# Patient Record
Sex: Male | Born: 1945 | Race: White | Hispanic: No | Smoking: Former smoker
Health system: Southern US, Community
[De-identification: ages and names within clinical notes are randomized; demographics above are authoritative.]

## PROBLEM LIST (undated history)

## (undated) DIAGNOSIS — I779 Disorder of arteries and arterioles, unspecified: Secondary | ICD-10-CM

## (undated) DIAGNOSIS — K219 Gastro-esophageal reflux disease without esophagitis: Secondary | ICD-10-CM

## (undated) DIAGNOSIS — D649 Anemia, unspecified: Secondary | ICD-10-CM

## (undated) DIAGNOSIS — I509 Heart failure, unspecified: Secondary | ICD-10-CM

## (undated) DIAGNOSIS — I739 Peripheral vascular disease, unspecified: Secondary | ICD-10-CM

## (undated) DIAGNOSIS — I714 Abdominal aortic aneurysm, without rupture, unspecified: Secondary | ICD-10-CM

## (undated) DIAGNOSIS — E785 Hyperlipidemia, unspecified: Secondary | ICD-10-CM

## (undated) DIAGNOSIS — I2510 Atherosclerotic heart disease of native coronary artery without angina pectoris: Secondary | ICD-10-CM

## (undated) DIAGNOSIS — C801 Malignant (primary) neoplasm, unspecified: Secondary | ICD-10-CM

## (undated) DIAGNOSIS — S72009A Fracture of unspecified part of neck of unspecified femur, initial encounter for closed fracture: Secondary | ICD-10-CM

## (undated) DIAGNOSIS — J449 Chronic obstructive pulmonary disease, unspecified: Secondary | ICD-10-CM

## (undated) DIAGNOSIS — I499 Cardiac arrhythmia, unspecified: Secondary | ICD-10-CM

## (undated) DIAGNOSIS — Z5189 Encounter for other specified aftercare: Secondary | ICD-10-CM

## (undated) DIAGNOSIS — H269 Unspecified cataract: Secondary | ICD-10-CM

## (undated) DIAGNOSIS — G459 Transient cerebral ischemic attack, unspecified: Secondary | ICD-10-CM

## (undated) DIAGNOSIS — I639 Cerebral infarction, unspecified: Secondary | ICD-10-CM

## (undated) DIAGNOSIS — M1990 Unspecified osteoarthritis, unspecified site: Secondary | ICD-10-CM

## (undated) DIAGNOSIS — F32A Depression, unspecified: Secondary | ICD-10-CM

## (undated) DIAGNOSIS — I10 Essential (primary) hypertension: Secondary | ICD-10-CM

## (undated) DIAGNOSIS — I214 Non-ST elevation (NSTEMI) myocardial infarction: Secondary | ICD-10-CM

## (undated) HISTORY — DX: Abdominal aortic aneurysm, without rupture, unspecified: I71.40

## (undated) HISTORY — PX: JOINT REPLACEMENT: SHX530

## (undated) HISTORY — DX: Peripheral vascular disease, unspecified: I73.9

## (undated) HISTORY — DX: Anemia, unspecified: D64.9

## (undated) HISTORY — DX: Fracture of unspecified part of neck of unspecified femur, initial encounter for closed fracture: S72.009A

## (undated) HISTORY — PX: EYE SURGERY: SHX253

## (undated) HISTORY — DX: Essential (primary) hypertension: I10

## (undated) HISTORY — DX: Gastro-esophageal reflux disease without esophagitis: K21.9

## (undated) HISTORY — DX: Disorder of arteries and arterioles, unspecified: I77.9

## (undated) HISTORY — DX: Encounter for other specified aftercare: Z51.89

## (undated) HISTORY — DX: Abdominal aortic aneurysm, without rupture: I71.4

## (undated) HISTORY — DX: Atherosclerotic heart disease of native coronary artery without angina pectoris: I25.10

## (undated) HISTORY — DX: Heart failure, unspecified: I50.9

## (undated) HISTORY — PX: OTHER SURGICAL HISTORY: SHX169

## (undated) HISTORY — PX: SPINE SURGERY: SHX786

## (undated) HISTORY — PX: FRACTURE SURGERY: SHX138

## (undated) HISTORY — DX: Non-ST elevation (NSTEMI) myocardial infarction: I21.4

---

## 2002-01-21 DIAGNOSIS — I639 Cerebral infarction, unspecified: Secondary | ICD-10-CM

## 2002-01-21 HISTORY — DX: Cerebral infarction, unspecified: I63.9

## 2004-01-22 DIAGNOSIS — G459 Transient cerebral ischemic attack, unspecified: Secondary | ICD-10-CM

## 2004-01-22 HISTORY — DX: Transient cerebral ischemic attack, unspecified: G45.9

## 2005-01-21 DIAGNOSIS — I214 Non-ST elevation (NSTEMI) myocardial infarction: Secondary | ICD-10-CM

## 2005-01-21 HISTORY — DX: Non-ST elevation (NSTEMI) myocardial infarction: I21.4

## 2005-06-02 ENCOUNTER — Ambulatory Visit: Payer: Self-pay | Admitting: Cardiology

## 2005-06-02 ENCOUNTER — Inpatient Hospital Stay (HOSPITAL_COMMUNITY): Admission: EM | Admit: 2005-06-02 | Discharge: 2005-06-05 | Payer: Self-pay | Admitting: Emergency Medicine

## 2005-07-01 ENCOUNTER — Ambulatory Visit: Payer: Self-pay | Admitting: Cardiology

## 2005-09-25 ENCOUNTER — Ambulatory Visit: Payer: Self-pay | Admitting: Cardiology

## 2011-12-10 ENCOUNTER — Emergency Department (HOSPITAL_COMMUNITY): Payer: BC Managed Care – PPO

## 2011-12-10 ENCOUNTER — Emergency Department (HOSPITAL_COMMUNITY)
Admission: EM | Admit: 2011-12-10 | Discharge: 2011-12-10 | Disposition: A | Discharge status: Home / Self Care | Payer: BC Managed Care – PPO | Attending: Emergency Medicine | Admitting: Emergency Medicine

## 2011-12-10 ENCOUNTER — Encounter (HOSPITAL_COMMUNITY): Payer: Self-pay | Admitting: *Deleted

## 2011-12-10 DIAGNOSIS — I2510 Atherosclerotic heart disease of native coronary artery without angina pectoris: Secondary | ICD-10-CM | POA: Insufficient documentation

## 2011-12-10 DIAGNOSIS — M5430 Sciatica, unspecified side: Secondary | ICD-10-CM | POA: Insufficient documentation

## 2011-12-10 DIAGNOSIS — I714 Abdominal aortic aneurysm, without rupture, unspecified: Secondary | ICD-10-CM | POA: Insufficient documentation

## 2011-12-10 DIAGNOSIS — I10 Essential (primary) hypertension: Secondary | ICD-10-CM | POA: Insufficient documentation

## 2011-12-10 DIAGNOSIS — M5432 Sciatica, left side: Secondary | ICD-10-CM

## 2011-12-10 DIAGNOSIS — F17200 Nicotine dependence, unspecified, uncomplicated: Secondary | ICD-10-CM | POA: Insufficient documentation

## 2011-12-10 DIAGNOSIS — I252 Old myocardial infarction: Secondary | ICD-10-CM | POA: Insufficient documentation

## 2011-12-10 DIAGNOSIS — Z8673 Personal history of transient ischemic attack (TIA), and cerebral infarction without residual deficits: Secondary | ICD-10-CM | POA: Insufficient documentation

## 2011-12-10 DIAGNOSIS — Z7982 Long term (current) use of aspirin: Secondary | ICD-10-CM | POA: Insufficient documentation

## 2011-12-10 HISTORY — DX: Cerebral infarction, unspecified: I63.9

## 2011-12-10 LAB — COMPREHENSIVE METABOLIC PANEL
ALT: 7 U/L (ref 0–53)
AST: 11 U/L (ref 0–37)
Albumin: 3.6 g/dL (ref 3.5–5.2)
Alkaline Phosphatase: 48 U/L (ref 39–117)
BUN: 11 mg/dL (ref 6–23)
CO2: 16 mEq/L — ABNORMAL LOW (ref 19–32)
Calcium: 8.9 mg/dL (ref 8.4–10.5)
Chloride: 109 mEq/L (ref 96–112)
Creatinine, Ser: 1.19 mg/dL (ref 0.50–1.35)
GFR calc Af Amer: 73 mL/min — ABNORMAL LOW (ref >90)
GFR calc non Af Amer: 63 mL/min — ABNORMAL LOW (ref >90)
Glucose, Bld: 92 mg/dL (ref 70–99)
Potassium: 3.3 mEq/L — ABNORMAL LOW (ref 3.5–5.1)
Sodium: 138 mEq/L (ref 135–145)
Total Bilirubin: 0.1 mg/dL — ABNORMAL LOW (ref 0.3–1.2)
Total Protein: 6.5 g/dL (ref 6.0–8.3)

## 2011-12-10 LAB — CBC WITH DIFFERENTIAL/PLATELET
Basophils Absolute: 0.1 10*3/uL (ref 0.0–0.1)
Basophils Relative: 1 % (ref 0–1)
Eosinophils Absolute: 0.2 10*3/uL (ref 0.0–0.7)
Eosinophils Relative: 2 % (ref 0–5)
HCT: 41.2 % (ref 39.0–52.0)
Hemoglobin: 14.3 g/dL (ref 13.0–17.0)
Lymphocytes Relative: 20 % (ref 12–46)
Lymphs Abs: 1.9 10*3/uL (ref 0.7–4.0)
MCH: 31.2 pg (ref 26.0–34.0)
MCHC: 34.7 g/dL (ref 30.0–36.0)
MCV: 90 fL (ref 78.0–100.0)
Monocytes Absolute: 0.6 10*3/uL (ref 0.1–1.0)
Monocytes Relative: 6 % (ref 3–12)
Neutro Abs: 6.8 10*3/uL (ref 1.7–7.7)
Neutrophils Relative %: 71 % (ref 43–77)
Platelets: 183 10*3/uL (ref 150–400)
RBC: 4.58 MIL/uL (ref 4.22–5.81)
RDW: 14.3 % (ref 11.5–15.5)
WBC: 9.5 10*3/uL (ref 4.0–10.5)

## 2011-12-10 MED ORDER — IOHEXOL 350 MG/ML SOLN
100.0000 mL | Freq: Once | INTRAVENOUS | Status: AC | PRN
  Administered 2011-12-10: 100 mL via INTRAVENOUS

## 2011-12-10 MED ORDER — OXYCODONE-ACETAMINOPHEN 5-325 MG PO TABS: 1.0000 | ORAL_TABLET | ORAL | Status: DC | PRN

## 2011-12-10 MED ORDER — OXYCODONE-ACETAMINOPHEN 5-325 MG PO TABS: 1.0000 | ORAL_TABLET | ORAL | Status: AC | PRN

## 2011-12-10 NOTE — ED Provider Notes (Signed)
History   This chart was scribed for Dione Booze, MD by Gerlean Ren, ED Scribe. This patient was seen in room [facility]14/[facility]14 and the patient's care was started at 8:07 PM    CSN: [id number]  Arrival date & time 12/10/11  0865   First MD Initiated Contact with Patient 12/10/11 1940      Chief Complaint  Patient presents with  . Leg Pain     The history is provided by the patient. No language interpreter was used.   Darryl Ward is a 66 y.o. male who presents to the Emergency Department complaining of 3 months of moderate, gradually worsening left leg pain radiating from hip to toes that is worsened by ambulation and improved by lying down.  Pt states pain started in left lumbar region with sudden onset but no injury or trauma as cause and is now only in left lower extremity.  Pt reports associated mild tingling in left distal lower extremity but denies numbness or weakness.  Pt reports he has lost 25 lbs in past 3 months with associated decreased appetite.  Pt has h/o MI, CVA, CAD, and HTN.  Pt is a current some day smoker but denies alcohol use.    Past Medical History  Diagnosis Date  . Myocardial infarct   . Stroke   . Coronary artery disease   . Hypertension     Past Surgical History  Procedure Date  . Coronary angioplasty with stent placement     History reviewed. No pertinent family history.  History  Substance Use Topics  . Smoking status: Current Some Day Smoker  . Smokeless tobacco: Not on file  . Alcohol Use: No      Review of Systems  Musculoskeletal: Negative for back pain.       Left leg pain.  Neurological: Negative for numbness.  All other systems reviewed and are negative.    Allergies  Review of patient's allergies indicates no known allergies.  Home Medications   Current Outpatient Rx  Name  Route  Sig  Dispense  Refill  . ASPIRIN 325 MG PO TABS   Oral   Take 325 mg by mouth daily.         . IBUPROFEN 200 MG PO TABS   Oral   Take 400  mg by mouth daily as needed. FOR PAIN         . MENTHOL (TOPICAL ANALGESIC) 5 % EX PADS   Apply externally   Apply 1 application topically daily as needed. FOR PAIN           BP 182/82  Pulse 83  Temp 97.2 F (36.2 C) (Oral)  Resp 18  Ht 5\' 10"  (1.778 m)  Wt 174 lb (78.926 kg)  BMI 24.97 kg/m2  SpO2 100%  Physical Exam  Nursing note and vitals reviewed. Constitutional: He is oriented to person, place, and time. He appears well-developed and well-nourished.  HENT:  Head: Normocephalic and atraumatic.  Eyes: Conjunctivae normal and EOM are normal. Pupils are equal, round, and reactive to light.  Neck: Normal range of motion. Neck supple.  Cardiovascular: Normal rate, regular rhythm and normal heart sounds.   Pulmonary/Chest: Effort normal and breath sounds normal.  Abdominal: Soft. Bowel sounds are normal.       A pulsatile aorta palpable, and nontender. No bruit heard.  Musculoskeletal: Normal range of motion.       Positive left straight leg raise at 60 degrees.  Pain on ROM of left hip.  Moderate tenderness left inguinal fold.    Neurological: He is alert and oriented to person, place, and time.  Skin: Skin is warm and dry.       1cm pedunculated, smooth, soft, mole on left upper arm.  Psychiatric: He has a normal mood and affect.    ED Course  Procedures (including critical care time) DIAGNOSTIC STUDIES: Oxygen Saturation is 100% on room air, normal by my interpretation.    COORDINATION OF CARE: 8:14 PM- Patient informed of clinical course, understands medical decision-making process, and agrees with plan.  Ordered CBC, c-met, left hip XR, chest XR, and lumbar spine XR.      Labs Reviewed - No data to display Results for orders placed during the hospital encounter of 12/10/11  CBC WITH DIFFERENTIAL      Component Value Range   WBC 9.5  4.0 - 10.5 K/uL   RBC 4.58  4.22 - 5.81 MIL/uL   Hemoglobin 14.3  13.0 - 17.0 g/dL   HCT 40.9  81.1 - 91.4 %   MCV 90.0   78.0 - 100.0 fL   MCH 31.2  26.0 - 34.0 pg   MCHC 34.7  30.0 - 36.0 g/dL   RDW 78.2  95.6 - 21.3 %   Platelets 183  150 - 400 K/uL   Neutrophils Relative 71  43 - 77 %   Neutro Abs 6.8  1.7 - 7.7 K/uL   Lymphocytes Relative 20  12 - 46 %   Lymphs Abs 1.9  0.7 - 4.0 K/uL   Monocytes Relative 6  3 - 12 %   Monocytes Absolute 0.6  0.1 - 1.0 K/uL   Eosinophils Relative 2  0 - 5 %   Eosinophils Absolute 0.2  0.0 - 0.7 K/uL   Basophils Relative 1  0 - 1 %   Basophils Absolute 0.1  0.0 - 0.1 K/uL  COMPREHENSIVE METABOLIC PANEL      Component Value Range   Sodium 138  135 - 145 mEq/L   Potassium 3.3 (*) 3.5 - 5.1 mEq/L   Chloride 109  96 - 112 mEq/L   CO2 16 (*) 19 - 32 mEq/L   Glucose, Bld 92  70 - 99 mg/dL   BUN 11  6 - 23 mg/dL   Creatinine, Ser 0.86  0.50 - 1.35 mg/dL   Calcium 8.9  8.4 - 57.8 mg/dL   Total Protein 6.5  6.0 - 8.3 g/dL   Albumin 3.6  3.5 - 5.2 g/dL   AST 11  0 - 37 U/L   ALT 7  0 - 53 U/L   Alkaline Phosphatase 48  39 - 117 U/L   Total Bilirubin 0.1 (*) 0.3 - 1.2 mg/dL   GFR calc non Af Amer 63 (*) >90 mL/min   GFR calc Af Amer 73 (*) >90 mL/min    Dg Chest 2 View  12/10/2011  *RADIOLOGY REPORT*  Clinical Data: Weight loss, leg pain.  CHEST - 2 VIEW  Comparison: 06/04/2005  Findings: Heart size within normal limits.  Aortic atherosclerosis. Mild hyperinflation and interstitial coarsening.  Sequelae of prior right anterolateral rib fractures.  No acute osseous finding.  IMPRESSION: Mild chronic changes.  No definite acute process.   Original Report Authenticated By: Jearld Lesch, M.D.    Dg Lumbar Spine Complete  12/10/2011  *RADIOLOGY REPORT*  Clinical Data: Leg pain  LUMBAR SPINE - COMPLETE 4+ VIEW  Comparison: None.  Findings: Multilevel degenerative changes with large lateral osteophytes at  L2-3 and L3-4 and multilevel anterior osteophytes. Gentle lumbar leftward curvature.  No acute fracture or dislocation.  No aggressive osseous lesions. Calcific  densities projecting over the renal shadows may reflect stones. Atherosclerotic aortic calcification. Suspicion for aneurysmal dilatation of the abdominal aorta up to 6.8 cm.  IMPRESSION: Multilevel degenerative changes.  No acute osseous abnormality.  Aneurysmal dilatation of the infrarenal abdominal aorta.  Recommend this be further evaluated with ultrasound or CTA.   Original Report Authenticated By: Jearld Lesch, M.D.    Dg Hip Complete Left  12/10/2011  *RADIOLOGY REPORT*  Clinical Data: Left leg pain  LEFT HIP - COMPLETE 2+ VIEW  Comparison: None.  Findings: No fracture or dislocation.  No aggressive osseous lesion or overt degenerative change.  Overlying soft tissues unremarkable.  IMPRESSION: No acute osseous abnormality of the left hip.   Original Report Authenticated By: Jearld Lesch, M.D.    Ct Cta Abd/pel W/cm &/or W/o Cm  12/10/2011  *RADIOLOGY REPORT*  Clinical Data: Leg pain.  Abdominal pain.  Weight loss.  CT ANGIOGRAPHY ABDOMEN AND PELVIS WITH CONTRAST AND WITHOUT CONTRAST  Comparison: 12/10/2011 lumbar spine radiograph  Findings: Limited images through the lung bases demonstrate no significant appreciable abnormality. The heart size is within normal limits. No pleural or pericardial effusion.  Unremarkable liver, biliary system, pancreas, spleen, adrenal glands.  Lobular renal contours with symmetric enhancement.  No hydronephrosis or hydroureter.  No bowel obstruction.  No CT evidence for colitis.  Normal appendix.  No free intraperitoneal air or fluid.  No lymphadenopathy.  Atherosclerotic disease of the aorta and branch vessels.  Patent celiac axis, SMA, single renal arteries bilaterally, and IMA.  At the level of the IMA, there is aneurysmal dilatation of the aorta measuring up to 5.5 cm in diameter.  No retroperitoneal fat stranding or wall disruption.  The right common, internal, and external iliac arteries are occluded.  There is reconstitution of flow to the right femoral  artery via collaterals.  The left common iliac, internal iliac, and external iliac show advanced atherosclerotic disease however remain patent.  Thin-walled bladder.  Nonspecific prostatic calcifications.  Small fat containing left inguinal hernia and small right inguinal hernia versus spermatic cord lipoma.  Multilevel degenerative changes of the imaged spine. No acute or aggressive appearing osseous lesion.  IMPRESSION:  No acute abdominopelvic process.  Infrarenal abdominal aortic aneurysm measures up to 5.5 cm in diameter.  Occluded right common, internal, and external iliac artery. Reconstitution to the right lower extremity/femoral artery via collateral flow.   Original Report Authenticated By: Jearld Lesch, M.D.      1. Sciatica of left side   2. Abdominal aortic aneurysm       MDM  Leg pain which has components of both sciatica and arthritis of the hip. Weight loss of uncertain cause. Since he is a smoker, it screening chest x-ray will be obtained, lumbar spine films will be obtained, and x-rays of the left hip will be obtained.  Lumbar spine films show a large abdominal aortic aneurysm. If this is dissecting, it could be causing his pain. He'll be sent for CT angiogram.  CT angiogram shows a 5.5 cm aneurysm. This is a size that generally would require elective repair. He is referred to Dr. Darrick Penna of vascular surgery for followup. He is given a prescription for Percocet for pain. No adequate explanation has been found for his weight loss.  I personally performed the services described in this documentation, which was scribed in  my presence. The recorded information has been reviewed and is accurate.           Dione Booze, MD 12/10/11 [phone]

## 2011-12-10 NOTE — ED Notes (Signed)
Pt back from radiology, resting quietly in room.

## 2011-12-10 NOTE — ED Notes (Signed)
Pt reporting pain in left leg from hip to toes, some weakness as well. Reports some tingling "like it's asleep" in feet bilaterally. Pedal pulses palpable.

## 2011-12-10 NOTE — ED Notes (Signed)
Patient transported to CT 

## 2011-12-10 NOTE — ED Notes (Addendum)
Pain lt leg from hip to toes, no known injury,  Has lost app 25 lbs in 4 mos , and has a black mole that he wants checked.

## 2011-12-12 ENCOUNTER — Other Ambulatory Visit (INDEPENDENT_AMBULATORY_CARE_PROVIDER_SITE_OTHER): Payer: BC Managed Care – PPO | Admitting: *Deleted

## 2011-12-12 ENCOUNTER — Ambulatory Visit (INDEPENDENT_AMBULATORY_CARE_PROVIDER_SITE_OTHER): Payer: BC Managed Care – PPO | Admitting: Vascular Surgery

## 2011-12-12 ENCOUNTER — Encounter: Payer: Self-pay | Admitting: Vascular Surgery

## 2011-12-12 VITALS — BP 137/76 | HR 78 | Resp 16 | Ht 70.0 in | Wt 168.5 lb

## 2011-12-12 DIAGNOSIS — Z01818 Encounter for other preprocedural examination: Secondary | ICD-10-CM

## 2011-12-12 DIAGNOSIS — I714 Abdominal aortic aneurysm, without rupture, unspecified: Secondary | ICD-10-CM | POA: Insufficient documentation

## 2011-12-12 DIAGNOSIS — Z01810 Encounter for preprocedural cardiovascular examination: Secondary | ICD-10-CM

## 2011-12-12 DIAGNOSIS — R0989 Other specified symptoms and signs involving the circulatory and respiratory systems: Secondary | ICD-10-CM

## 2011-12-12 NOTE — Progress Notes (Signed)
[facility] [place] HISTORY AND PHYSICAL   History of Present Illness:  Patient is a 65 y.o. year old male who presents for evaluation of abdominal aortic aneurysm.  The patient has complained of left lower extremity aching pain for approximately 4 weeks. He denies any claudication symptoms in the right lower extremity. He denies back pain but states that he did have some left flank pain recently. The aneurysm is currently 5.5 cm in diameter by CT performed at [facility] on 11/19.  The patient denies abdominal pain.  The patient denies back pain.  The patient hadenies family history of AAA.  Other medical problems include coronary artery disease with previous myocardial infarction and coronary stent in 2007 by [place]. He has ongoing tobacco abuse approximately half to 1 pack of cigarettes per day. Greater than 3 minutes today were spent regarding smoking cessation counseling. He also has a history of hypertension. All of these are currently controlled. He is currently taking Percocet for the left lower extremity pain. He is fairly active at work and works in a [employer].  Past Medical History  Diagnosis Date  . Myocardial infarct   . Stroke   . Coronary artery disease   . Hypertension   . AAA (abdominal aortic aneurysm)     Past Surgical History  Procedure Date  . Coronary angioplasty with stent placement      Social History History  Substance Use Topics  . Smoking status: Current Some Day Smoker -- 0.5 packs/day    Types: Cigarettes  . Smokeless tobacco: Never Used     Comment: pt states that he is not interested in a support group or QUIT NOW  . Alcohol Use: No    Family History Family History  Problem Relation Age of Onset  . Hyperlipidemia Sister   . Heart attack Brother     Allergies  No Known Allergies   Current Outpatient Prescriptions  Medication Sig Dispense Refill  . oxyCODONE-acetaminophen (PERCOCET/ROXICET) 5-325 MG per tablet Take 1 tablet  by mouth every 4 (four) hours as needed for pain.  6 tablet  0  . aspirin 325 MG tablet Take 325 mg by mouth daily.      [name] ibuprofen (ADVIL,MOTRIN) 200 MG tablet Take 400 mg by mouth daily as needed. FOR PAIN      . Menthol, Topical Analgesic, (ICY HOT) 5 % PADS Apply 1 application topically daily as needed. FOR PAIN        ROS:   General:  No weight loss, Fever, chills  HEENT: No recent headaches, no nasal bleeding, no visual changes, no sore throat  Neurologic: No dizziness, blackouts, seizures. No recent symptoms of stroke or mini- stroke. No recent episodes of slurred speech, or temporary blindness.  Cardiac: No recent episodes of chest pain/pressure, no shortness of breath at rest.  No shortness of breath with exertion.  Denies history of atrial fibrillation or irregular heartbeat  Vascular: No history of rest pain in feet.  No history of claudication.  No history of non-healing ulcer, No history of DVT   Pulmonary: No home oxygen, no productive cough, no hemoptysis,  No asthma or wheezing  Musculoskeletal:  [ ]  Arthritis, [ ]  Low back pain,  [ ]  Joint pain  Hematologic:No history of hypercoagulable state.  No history of easy bleeding.  No history of anemia  Gastrointestinal: No hematochezia or melena,  No gastroesophageal reflux, no trouble swallowing  Urinary: [ ]  chronic Kidney disease, [ ]  on HD - [ ]   MWF or [ ]  TTHS, [ ]  Burning with urination, [ ]  Frequent urination, [ ]  Difficulty urinating;   Skin: No rashes  Psychological: No history of anxiety,  No history of depression   Physical Examination  Filed Vitals:   12/12/11 0837  BP: 137/76  Pulse: 78  Resp: 16  Height: 5\' 10"  (1.778 m)  Weight: 168 lb 8 oz (76.431 kg)  SpO2: 100%    Body mass index is 24.18 kg/(m^2).  General:  Alert and oriented, no acute distress HEENT: Normal Neck: Right side bruit no JVD Pulmonary: Clear to auscultation bilaterally Cardiac: Regular Rate and Rhythm without  murmur Gastrointestinal: Soft, non-tender, non-distended, pulsatile epigastric mass, no scars Skin: No rash Extremity Pulses:  2+ radial right side one plus left radial, 2+ left femoral absent right femoral, absent popliteal dorsalis pedis, posterior tibial pulses bilaterally Musculoskeletal: No deformity or edema  Neurologic: Upper and lower extremity motor 5/5 and symmetric  DATA: CT angiogram the abdomen and pelvis is reviewed today. This shows a 5.5 cm infrarenal abdominal aortic aneurysm. The neck below the renal arteries is lined with approximately 10% thrombus. It is 27 mm in diameter. The right common internal and external iliac arteries are occluded. There is calcific plaque throughout the entire left iliac system but no obvious flow-limiting stenosis. He had evidence of small bilateral inguinal hernias. Anterior left renal vein. Patent left and right renal arteries patent superior mesenteric and celiac arteries.   ASSESSMENT: Asymptomatic abdominal aortic aneurysm most likely not the cause of his left lower extremity pain but certainly at a size to warrant repair. Asymptomatic right carotid bruit.   PLAN:  Carotid duplex exam today to evaluate right-sided carotid bruit. #2 referral to [facility] cardiology for cardiac risk stratification prior to aortic aneurysm repair. #3 the patient will most likely need an open abdominal aortic aneurysm repair based on thrombus lining the neck this aorta as well as occlusive disease.  He may require aortobifemoral bypass grafting. The patient will have a followup visit with me after his cardiology evaluation to discuss the final plan for his operation. He is advised to return to the emergency room if he develops severe abdominal or excruciating back pain prior to his operation and let them know he has an abdominal aortic aneurysm. We will also schedule him for bilateral ABIs and arterial duplex for his claudication symptoms when he returns for his preoperative  visit.   Fabienne Bruns, MD [facility] [place] Office: [phone] Pager: [phone]  Addendum: Carotid duplex scan was performed today which are reviewed and interpreted. This showed less than 40% left and right internal carotid artery stenosis bilaterally.  Patient gave [name] paperwork today for FMLA and short term disability. Will return this to him soon. He was given a note to miss work since he is unable to perform his job duties he was left lower extremity pain.  Fabienne Bruns, MD [facility] [place] Office: [phone] Pager: [phone]

## 2011-12-12 NOTE — Addendum Note (Signed)
Addended by: Sharee Pimple on: 12/12/2011 03:18 PM   Modules accepted: Orders

## 2011-12-13 ENCOUNTER — Other Ambulatory Visit: Payer: Self-pay | Admitting: *Deleted

## 2011-12-13 ENCOUNTER — Encounter: Payer: Self-pay | Admitting: *Deleted

## 2011-12-13 ENCOUNTER — Ambulatory Visit (INDEPENDENT_AMBULATORY_CARE_PROVIDER_SITE_OTHER): Payer: BC Managed Care – PPO | Admitting: Cardiology

## 2011-12-13 ENCOUNTER — Encounter: Payer: Self-pay | Admitting: Cardiology

## 2011-12-13 VITALS — BP 138/88 | HR 71 | Ht 70.0 in | Wt 169.1 lb

## 2011-12-13 DIAGNOSIS — E782 Mixed hyperlipidemia: Secondary | ICD-10-CM | POA: Insufficient documentation

## 2011-12-13 DIAGNOSIS — Z01818 Encounter for other preprocedural examination: Secondary | ICD-10-CM

## 2011-12-13 DIAGNOSIS — I2510 Atherosclerotic heart disease of native coronary artery without angina pectoris: Secondary | ICD-10-CM | POA: Insufficient documentation

## 2011-12-13 MED ORDER — METOPROLOL SUCCINATE ER 25 MG PO TB24: 25.0000 mg | ORAL_TABLET | Freq: Every day | ORAL | Status: DC

## 2011-12-13 NOTE — Assessment & Plan Note (Signed)
History of PTCA to a small diagonal branch 2007, otherwise nonobstructive disease in the major epicardial vessels at that time. Followup ischemic testing being planned, with medical therapy otherwise.

## 2011-12-13 NOTE — Assessment & Plan Note (Signed)
Followup FLP.

## 2011-12-13 NOTE — Patient Instructions (Addendum)
Your physician recommends that you schedule a follow-up appointment in: 3 MONTHS WITH SM  Your physician has requested that you have a lexiscan myoview. For further information please visit [email or web address] Please follow instruction sheet, as given. WE WILL CALL YOU WITH THE RESULTS  Your physician recommends that you return for lab work in: ONE WEEK  Your physician has recommended you make the following change in your medication:   1) START TOPOROL XL 25MG  DAILY

## 2011-12-13 NOTE — Assessment & Plan Note (Signed)
Patient being considered for elective AAA repair under general anesthesia. His cardiac history was reviewed, fortunately he denies any angina and has had no decompensated heart failure symptoms. Medical therapy limited to aspirin only for quite some time. Baseline ECG reviewed and nonspecific. Plan is to proceed with a Lexiscan Myoview for ischemic burden assessment and further risk stratification. We will also initiate Toprol-XL 25 mg daily for further perioperative risk reduction, and follow up on lipid profile as statin therapy likely also be needed. Will call patient with results and forward them also to Dr. Darrick Penna.

## 2011-12-13 NOTE — Progress Notes (Signed)
Clinical Summary Darryl Ward is a 66 y.o.male referred for cardiology consultation by Dr. Darrick Ward. Recent abdominal CT scan revealed a 5.5 cm infrarenal AAA, being considered for probable open repair under general anesthesia. He has no primary care physician.  Cardiac history is noted below. He was last seen by Dr. Antoine Ward in the office back in 2007, has had no cardiology followup since then. ECG today shows sinus rhythm with PACs, nonspecific T wave changes. He reports no angina symptoms, NYHA class II dyspnea on exertion, no palpitations or syncope. Main limitation seems to be related to intermittent leg discomfort. He was given oxycodone by the ER physician.  Recent lab work noted including hemoglobin 14.3, platelets 183, BUN 11, creatinine 1.1.  He has had no interval ischemic evaluation since 2007. Also no recent lipid followup.  No Known Allergies  Current Outpatient Prescriptions  Medication Sig Dispense Refill  . aspirin 325 MG tablet Take 325 mg by mouth daily.      [name] ibuprofen (ADVIL,MOTRIN) 200 MG tablet Take 400 mg by mouth daily as needed. FOR PAIN      . Menthol, Topical Analgesic, (ICY HOT) 5 % PADS Apply 1 application topically daily as needed. FOR PAIN      . oxyCODONE-acetaminophen (PERCOCET/ROXICET) 5-325 MG per tablet Take 1 tablet by mouth every 4 (four) hours as needed for pain.  6 tablet  0  . metoprolol succinate (TOPROL XL) 25 MG 24 hr tablet Take 1 tablet (25 mg total) by mouth daily.  30 tablet  3    Past Medical History  Diagnosis Date  . NSTEMI (non-ST elevated myocardial infarction)     2007  . Stroke     2004  . Coronary atherosclerosis of native coronary artery     PTCA small diagonal 2007 otherwise nonobstructive CAD, EF 50-55%  . Essential hypertension, benign   . AAA (abdominal aortic aneurysm)   . Carotid artery disease     Nonobstructive    History reviewed. No pertinent past surgical history.  Family History  Problem Relation Age of Onset    . Hyperlipidemia Sister   . Heart attack Brother     Social History Darryl Ward reports that he has been smoking Cigarettes.  He has been smoking about .5 packs per day. He has never used smokeless tobacco. Darryl Ward reports that he does not drink alcohol.  Review of Systems Negative except as outlined.  Physical Examination Filed Vitals:   12/13/11 1439  BP: 138/88  Pulse: 71   Filed Weights   12/13/11 1439  Weight: 169 lb 1.9 oz (76.712 kg)   No acute distress. HEENT: Conjunctiva and lids normal, oropharynx clear. Neck: Supple, no elevated JVP, soft carotid bruits, no thyromegaly. Lungs: Clear to auscultation, nonlabored breathing at rest. Cardiac: Regular rate and rhythm, no S3 or significant systolic murmur, no pericardial rub. Abdomen: Soft, nontender, bowel sounds present. Extremities: No pitting edema, distal pulses 2+. Skin: Warm and dry. Musculoskeletal: No kyphosis. Neuropsychiatric: Alert and oriented x3, affect grossly appropriate.   Problem List and Plan   Preoperative evaluation to rule out surgical contraindication Patient being considered for elective AAA repair under general anesthesia. His cardiac history was reviewed, fortunately he denies any angina and has had no decompensated heart failure symptoms. Medical therapy limited to aspirin only for quite some time. Baseline ECG reviewed and nonspecific. Plan is to proceed with a Lexiscan Myoview for ischemic burden assessment and further risk stratification. We will also initiate Toprol-XL 25 mg  daily for further perioperative risk reduction, and follow up on lipid profile as statin therapy likely also be needed. Will call patient with results and forward them also to Dr. Darrick Ward.  Coronary atherosclerosis of native coronary artery History of PTCA to a small diagonal branch 2007, otherwise nonobstructive disease in the major epicardial vessels at that time. Followup ischemic testing being planned, with medical  therapy otherwise.  Mixed hyperlipidemia Followup FLP.    Darryl Ward, M.D., F.A.C.C.

## 2011-12-16 LAB — LIPID PANEL
Cholesterol: 181 mg/dL (ref 0–200)
HDL: 28 mg/dL — ABNORMAL LOW (ref >39)
LDL Cholesterol: 125 mg/dL — ABNORMAL HIGH (ref 0–99)
Total CHOL/HDL Ratio: 6.5 Ratio
Triglycerides: 142 mg/dL (ref <150)
VLDL: 28 mg/dL (ref 0–40)

## 2011-12-23 ENCOUNTER — Encounter: Payer: Self-pay | Admitting: *Deleted

## 2011-12-23 MED ORDER — SIMVASTATIN 20 MG PO TABS: 20.0000 mg | ORAL_TABLET | Freq: Every day | ORAL | Status: DC

## 2011-12-23 NOTE — Addendum Note (Signed)
Addended by: Derry Lory A on: 12/23/2011 08:18 AM   Modules accepted: Orders

## 2011-12-25 ENCOUNTER — Encounter: Payer: Self-pay | Admitting: Vascular Surgery

## 2011-12-25 ENCOUNTER — Other Ambulatory Visit: Payer: Self-pay | Admitting: *Deleted

## 2011-12-25 DIAGNOSIS — I739 Peripheral vascular disease, unspecified: Secondary | ICD-10-CM

## 2011-12-26 ENCOUNTER — Encounter: Payer: Self-pay | Admitting: Vascular Surgery

## 2011-12-26 ENCOUNTER — Encounter (INDEPENDENT_AMBULATORY_CARE_PROVIDER_SITE_OTHER): Payer: BC Managed Care – PPO | Admitting: *Deleted

## 2011-12-26 ENCOUNTER — Other Ambulatory Visit: Payer: Self-pay | Admitting: *Deleted

## 2011-12-26 ENCOUNTER — Ambulatory Visit (INDEPENDENT_AMBULATORY_CARE_PROVIDER_SITE_OTHER): Payer: BC Managed Care – PPO | Admitting: Vascular Surgery

## 2011-12-26 ENCOUNTER — Encounter: Payer: Self-pay | Admitting: *Deleted

## 2011-12-26 VITALS — BP 158/78 | HR 56 | Ht 70.0 in | Wt 172.0 lb

## 2011-12-26 DIAGNOSIS — I739 Peripheral vascular disease, unspecified: Secondary | ICD-10-CM

## 2011-12-26 DIAGNOSIS — I714 Abdominal aortic aneurysm, without rupture: Secondary | ICD-10-CM

## 2011-12-26 NOTE — Progress Notes (Signed)
Patient is a 66 year old male who returns for followup today. He has a known abdominal aortic aneurysm. He also has iliac occlusive disease bilaterally. He primarily complains of left leg pain. He recently saw Dr. Diona Browner for cardiac risk stratification. At that time he was placed on a beta blocker as well as a statin. Unfortunately he continues to smoke but is trying to quit. He is on aspirin. He denies any abdominal or back pain. He has very short distance claudication.  Past Medical History  Diagnosis Date  . NSTEMI (non-ST elevated myocardial infarction)     2007  . Stroke     2004  . Coronary atherosclerosis of native coronary artery     PTCA small diagonal 2007 otherwise nonobstructive CAD, EF 50-55%  . Essential hypertension, benign   . AAA (abdominal aortic aneurysm)   . Carotid artery disease     Nonobstructive   History reviewed. No pertinent past surgical history.   Review of systems: He denies shortness of breath. He denies chest pain. Current Outpatient Prescriptions on File Prior to Visit  Medication Sig Dispense Refill  . aspirin 325 MG tablet Take 325 mg by mouth daily.      [name] ibuprofen (ADVIL,MOTRIN) 200 MG tablet Take 400 mg by mouth daily as needed. FOR PAIN      . Menthol, Topical Analgesic, (ICY HOT) 5 % PADS Apply 1 application topically daily as needed. FOR PAIN      . metoprolol succinate (TOPROL XL) 25 MG 24 hr tablet Take 1 tablet (25 mg total) by mouth daily.  30 tablet  3  . simvastatin (ZOCOR) 20 MG tablet Take 1 tablet (20 mg total) by mouth at bedtime.  90 tablet  3    No Known Allergies  Physical exam:  Filed Vitals:   12/26/11 1319  BP: 158/78  Pulse: 56  Height: 5\' 10"  (1.778 m)  Weight: 172 lb (78.019 kg)  SpO2: 99%   Abdomen soft nontender nondistended vaguely palpable pulsatile mass  Extremity: Absent right femoral pulse 1+ left femoral pulse absent popliteal and pedal pulses  Data: Bilateral ABIs were performed today ABI on the left  was 0.95 right was 0.65  Assessment: Abdominal aortic aneurysm with iliac occlusive disease  Plan: #1 patient was given a prescription for Percocet today #30 dispensed to last him until the time of his operation #2 aortobifemoral bypass for repair of abdominal aortic aneurysm on Wednesday, December 18 pending the results of his stress test which is scheduled for Monday, December 9.  Fabienne Bruns, MD [facility] Office: [phone] Pager: [phone]

## 2011-12-30 ENCOUNTER — Encounter (HOSPITAL_COMMUNITY)
Admission: RE | Admit: 2011-12-30 | Discharge: 2011-12-30 | Disposition: A | Discharge status: Home / Self Care | Payer: BC Managed Care – PPO | Source: Ambulatory Visit | Attending: Cardiology | Admitting: Cardiology

## 2011-12-30 ENCOUNTER — Encounter (HOSPITAL_COMMUNITY): Payer: Self-pay

## 2011-12-30 ENCOUNTER — Encounter (HOSPITAL_COMMUNITY): Payer: Self-pay | Admitting: Cardiology

## 2011-12-30 ENCOUNTER — Ambulatory Visit (HOSPITAL_COMMUNITY)
Admission: RE | Admit: 2011-12-30 | Discharge: 2011-12-30 | Disposition: A | Discharge status: Home / Self Care | Payer: BC Managed Care – PPO | Source: Ambulatory Visit | Attending: Cardiology | Admitting: Cardiology

## 2011-12-30 DIAGNOSIS — I2510 Atherosclerotic heart disease of native coronary artery without angina pectoris: Secondary | ICD-10-CM | POA: Insufficient documentation

## 2011-12-30 DIAGNOSIS — Z8673 Personal history of transient ischemic attack (TIA), and cerebral infarction without residual deficits: Secondary | ICD-10-CM | POA: Insufficient documentation

## 2011-12-30 DIAGNOSIS — I739 Peripheral vascular disease, unspecified: Secondary | ICD-10-CM | POA: Insufficient documentation

## 2011-12-30 DIAGNOSIS — I10 Essential (primary) hypertension: Secondary | ICD-10-CM | POA: Insufficient documentation

## 2011-12-30 DIAGNOSIS — Z01818 Encounter for other preprocedural examination: Secondary | ICD-10-CM

## 2011-12-30 MED ORDER — REGADENOSON 0.4 MG/5ML IV SOLN
INTRAVENOUS | Status: AC
  Administered 2011-12-30: 0.4 mg via INTRAVENOUS
  Filled 2011-12-30: qty 5

## 2011-12-30 MED ORDER — TECHNETIUM TC 99M SESTAMIBI - CARDIOLITE
30.0000 | Freq: Once | INTRAVENOUS | Status: AC | PRN
  Administered 2011-12-30: 29 via INTRAVENOUS

## 2011-12-30 MED ORDER — SODIUM CHLORIDE 0.9 % IJ SOLN
INTRAMUSCULAR | Status: AC
  Administered 2011-12-30: 10 mL via INTRAVENOUS
  Filled 2011-12-30: qty 10

## 2011-12-30 MED ORDER — TECHNETIUM TC 99M SESTAMIBI GENERIC - CARDIOLITE
10.0000 | Freq: Once | INTRAVENOUS | Status: AC | PRN
  Administered 2011-12-30: 10.8 via INTRAVENOUS

## 2011-12-30 NOTE — Progress Notes (Signed)
Stress Lab Nurses Notes - Darryl Ward 12/30/2011 Reason for doing test: CAD and Surgical Clearance Type of test: Marlane Hatcher Nurse performing test: Parke Poisson, RN Nuclear Medicine Tech: Lou Cal Echo Tech: Not Applicable MD performing test: Ival Bible & Joni Reining NP Family MD: [facility] explained and consent signed: yes IV started: 22g jelco, Saline lock flushed, No redness or edema and Saline lock started in radiology Symptoms: "Felt Funny" Treatment/Intervention: None Reason test stopped: protocol completed After Recovery IV was: Discontinued via X-ray tech and No redness or edema Patient to return to Nuc. Med at : 11:45 Patient discharged: Home Patient's Condition upon discharge was: stable Comments: During test BP 157/81 & HR 104.  Recovery BP 155/82 & HR 85.  Symptoms resolved in recovery. Darryl Ward

## 2011-12-31 ENCOUNTER — Encounter (HOSPITAL_COMMUNITY): Payer: Self-pay | Admitting: Pharmacy Technician

## 2012-01-02 ENCOUNTER — Telehealth: Payer: Self-pay | Admitting: *Deleted

## 2012-01-02 NOTE — Telephone Encounter (Signed)
Opened in error

## 2012-01-03 ENCOUNTER — Encounter (HOSPITAL_COMMUNITY)
Admission: RE | Admit: 2012-01-03 | Discharge: 2012-01-03 | Disposition: A | Discharge status: Home / Self Care | Payer: BC Managed Care – PPO | Source: Ambulatory Visit | Attending: Vascular Surgery | Admitting: Vascular Surgery

## 2012-01-03 ENCOUNTER — Encounter (HOSPITAL_COMMUNITY): Payer: Self-pay

## 2012-01-03 HISTORY — DX: Transient cerebral ischemic attack, unspecified: G45.9

## 2012-01-03 HISTORY — DX: Unspecified cataract: H26.9

## 2012-01-03 HISTORY — DX: Hyperlipidemia, unspecified: E78.5

## 2012-01-03 LAB — BLOOD GAS, ARTERIAL
Acid-Base Excess: 2.1 mmol/L — ABNORMAL HIGH (ref 0.0–2.0)
Bicarbonate: 25.8 mEq/L — ABNORMAL HIGH (ref 20.0–24.0)
Drawn by: 344381
FIO2: 0.21 %
O2 Saturation: 95.1 %
Patient temperature: 98.6
TCO2: 26.9 mmol/L (ref 0–100)
pCO2 arterial: 37.9 mmHg (ref 35.0–45.0)
pH, Arterial: 7.447 (ref 7.350–7.450)
pO2, Arterial: 67.9 mmHg — ABNORMAL LOW (ref 80.0–100.0)

## 2012-01-03 LAB — COMPREHENSIVE METABOLIC PANEL
ALT: 6 U/L (ref 0–53)
AST: 12 U/L (ref 0–37)
Albumin: 3.2 g/dL — ABNORMAL LOW (ref 3.5–5.2)
Alkaline Phosphatase: 50 U/L (ref 39–117)
BUN: 15 mg/dL (ref 6–23)
CO2: 24 mEq/L (ref 19–32)
Calcium: 8.9 mg/dL (ref 8.4–10.5)
Chloride: 104 mEq/L (ref 96–112)
Creatinine, Ser: 0.86 mg/dL (ref 0.50–1.35)
GFR calc Af Amer: >90 mL/min (ref >90)
GFR calc non Af Amer: 90 mL/min — ABNORMAL LOW (ref >90)
Glucose, Bld: 95 mg/dL (ref 70–99)
Potassium: 3.1 mEq/L — ABNORMAL LOW (ref 3.5–5.1)
Sodium: 140 mEq/L (ref 135–145)
Total Bilirubin: 0.2 mg/dL — ABNORMAL LOW (ref 0.3–1.2)
Total Protein: 6.6 g/dL (ref 6.0–8.3)

## 2012-01-03 LAB — URINALYSIS, ROUTINE W REFLEX MICROSCOPIC
Glucose, UA: NEGATIVE mg/dL
Hgb urine dipstick: NEGATIVE
Ketones, ur: NEGATIVE mg/dL
Leukocytes, UA: NEGATIVE
Nitrite: NEGATIVE
Protein, ur: NEGATIVE mg/dL
Specific Gravity, Urine: 1.026 (ref 1.005–1.030)
Urobilinogen, UA: 1 mg/dL (ref 0.0–1.0)
pH: 6 (ref 5.0–8.0)

## 2012-01-03 LAB — CBC
HCT: 34.2 % — ABNORMAL LOW (ref 39.0–52.0)
Hemoglobin: 11.8 g/dL — ABNORMAL LOW (ref 13.0–17.0)
MCH: 31.1 pg (ref 26.0–34.0)
MCHC: 34.5 g/dL (ref 30.0–36.0)
MCV: 90 fL (ref 78.0–100.0)
Platelets: 170 10*3/uL (ref 150–400)
RBC: 3.8 MIL/uL — ABNORMAL LOW (ref 4.22–5.81)
RDW: 14.6 % (ref 11.5–15.5)
WBC: 10.6 10*3/uL — ABNORMAL HIGH (ref 4.0–10.5)

## 2012-01-03 LAB — ABO/RH: ABO/RH(D): O NEG

## 2012-01-03 LAB — SURGICAL PCR SCREEN
MRSA, PCR: NEGATIVE
Staphylococcus aureus: NEGATIVE

## 2012-01-03 NOTE — Progress Notes (Addendum)
Cardiologist is Dr.McDowell with office visit in epic   Stress test in epic from 12/30/11 Denies echo Heart cath in 2007  EKG in epic from 12/13/11 CXR in epic from 12/10/11  Pt doesn't have a medical md-dr.mcdowell gives script for Metoprolol

## 2012-01-03 NOTE — Pre-Procedure Instructions (Signed)
[address]  01/03/2012   Your procedure is scheduled on:  Wed, Dec 18 @ 8:30 AM  Report to [facility] at 6:30 AM.  Call this number if you have problems the morning of surgery: [phone]   Remember:   Do not eat food:After Midnight.  Take these medicines the morning of surgery with A SIP OF WATER: Metoprolol(Toprol) and Pain Pill(if needed)   Do not wear jewelry  Do not wear lotions, powders, or colognes. You may wear deodorant.  Men may shave face and neck.  Do not bring valuables to the hospital.  Contacts, dentures or bridgework may not be worn into surgery.  Leave suitcase in the car. After surgery it may be brought to your room.  For patients admitted to the hospital, checkout time is 11:00 AM the day of discharge.   Patients discharged the day of surgery will not be allowed to drive home.  Special Instructions: Shower using CHG 2 nights before surgery and the night before surgery.  If you shower the day of surgery use CHG.  Use special wash - you have one bottle of CHG for all showers.  You should use approximately 1/3 of the bottle for each shower.   Please read over the following fact sheets that you were given: Pain Booklet, Coughing and Deep Breathing, Blood Transfusion Information, MRSA Information and Surgical Site Infection Prevention

## 2012-01-06 NOTE — Consult Note (Signed)
Anesthesia chart review: Patient is a 66 year old male scheduled for aortobifemoral bypass grafting for AAA repair by Dr. Darrick Penna on 01/08/2012. History includes AAA, smoking, HLD, CVA '04, HTN, CAD/NSTEMI s/p "PCTA small diagonal 2007 otherwise nonobstructive CAD, EF 50-55%", < 40% bilateral ICA stenosis by carotid duplex 12/12/11.  Patient does not have a PCP. Dr. Darrick Penna referred Darryl Ward to cardiologist Dr. Diona Browner for a preoperative evaluation.  Following a low risk stress test, Dr. Diona Browner wrote, "Low risk study without large ischemic zones. Should be able to proceed with elective AAA repair with an acceptable perioperative cardiac risk. Continue medical therapy - ASA, beta blocker, statin. Depending on BP, he might also need ACE-I added. Our service can assist with management as needed at [facility]."  EKG on 12/13/11 showed sinus rhythm with PACs, nonspecific T wave abnormality.  Nuclear stress test on 12/30/11 showed: Overall low risk Lexiscan Myoview as outlined. There were no  diagnostic ST-segment abnormalities, occasional PACs and PVCs noted without sustained arrhythmia. Perfusion imaging is most consistent with soft tissue attenuation affecting the inferior septal wall without clear evidence of ischemia, and normal LVEF of 62%.   Chest x-ray on 12/10/2011 showed mild chronic changes without definite acute process.  Pre-operative labs noted.  PT/PTT on the day of surgery.  Anticipate he can proceed as planned.  Shonna Chock, PA-C

## 2012-01-07 MED ORDER — DEXTROSE 5 % IV SOLN
1.5000 g | INTRAVENOUS | Status: AC
  Administered 2012-01-08: 1.5 g via INTRAVENOUS
  Filled 2012-01-07: qty 1.5

## 2012-01-08 ENCOUNTER — Encounter (HOSPITAL_COMMUNITY): Payer: Self-pay | Admitting: Surgery

## 2012-01-08 ENCOUNTER — Encounter (HOSPITAL_COMMUNITY): Payer: Self-pay | Admitting: Vascular Surgery

## 2012-01-08 ENCOUNTER — Inpatient Hospital Stay (HOSPITAL_COMMUNITY): Payer: BC Managed Care – PPO

## 2012-01-08 ENCOUNTER — Ambulatory Visit (HOSPITAL_COMMUNITY): Payer: BC Managed Care – PPO | Admitting: Vascular Surgery

## 2012-01-08 ENCOUNTER — Encounter (HOSPITAL_COMMUNITY)
Admission: RE | Disposition: A | Discharge status: Home Health | Payer: Self-pay | Source: Ambulatory Visit | Attending: Vascular Surgery

## 2012-01-08 ENCOUNTER — Inpatient Hospital Stay (HOSPITAL_COMMUNITY)
Admission: RE | Admit: 2012-01-08 | Discharge: 2012-01-13 | DRG: 110 | Disposition: A | Discharge status: Home Health | Payer: BC Managed Care – PPO | Source: Ambulatory Visit | Attending: Vascular Surgery | Admitting: Vascular Surgery

## 2012-01-08 DIAGNOSIS — I10 Essential (primary) hypertension: Secondary | ICD-10-CM | POA: Diagnosis present

## 2012-01-08 DIAGNOSIS — I2510 Atherosclerotic heart disease of native coronary artery without angina pectoris: Secondary | ICD-10-CM | POA: Diagnosis present

## 2012-01-08 DIAGNOSIS — Z8673 Personal history of transient ischemic attack (TIA), and cerebral infarction without residual deficits: Secondary | ICD-10-CM

## 2012-01-08 DIAGNOSIS — I252 Old myocardial infarction: Secondary | ICD-10-CM

## 2012-01-08 DIAGNOSIS — Z9861 Coronary angioplasty status: Secondary | ICD-10-CM

## 2012-01-08 DIAGNOSIS — Z7982 Long term (current) use of aspirin: Secondary | ICD-10-CM

## 2012-01-08 DIAGNOSIS — I749 Embolism and thrombosis of unspecified artery: Secondary | ICD-10-CM | POA: Diagnosis present

## 2012-01-08 DIAGNOSIS — I714 Abdominal aortic aneurysm, without rupture, unspecified: Principal | ICD-10-CM

## 2012-01-08 DIAGNOSIS — I7789 Other specified disorders of arteries and arterioles: Secondary | ICD-10-CM | POA: Diagnosis present

## 2012-01-08 DIAGNOSIS — I739 Peripheral vascular disease, unspecified: Secondary | ICD-10-CM | POA: Diagnosis present

## 2012-01-08 DIAGNOSIS — I70219 Atherosclerosis of native arteries of extremities with intermittent claudication, unspecified extremity: Secondary | ICD-10-CM

## 2012-01-08 HISTORY — PX: AORTA - BILATERAL FEMORAL ARTERY BYPASS GRAFT: SHX1175

## 2012-01-08 LAB — POCT I-STAT 7, (LYTES, BLD GAS, ICA,H+H)
Acid-Base Excess: 1 mmol/L (ref 0.0–2.0)
Bicarbonate: 27.3 mEq/L — ABNORMAL HIGH (ref 20.0–24.0)
Calcium, Ion: 1.08 mmol/L — ABNORMAL LOW (ref 1.13–1.30)
HCT: 30 % — ABNORMAL LOW (ref 39.0–52.0)
Hemoglobin: 10.2 g/dL — ABNORMAL LOW (ref 13.0–17.0)
O2 Saturation: 100 %
Patient temperature: 35.2
Potassium: 3.5 mEq/L (ref 3.5–5.1)
Sodium: 141 mEq/L (ref 135–145)
TCO2: 29 mmol/L (ref 0–100)
pCO2 arterial: 47.3 mmHg — ABNORMAL HIGH (ref 35.0–45.0)
pH, Arterial: 7.36 (ref 7.350–7.450)
pO2, Arterial: 491 mmHg — ABNORMAL HIGH (ref 80.0–100.0)

## 2012-01-08 LAB — BASIC METABOLIC PANEL
BUN: 10 mg/dL (ref 6–23)
CO2: 23 mEq/L (ref 19–32)
Calcium: 8.1 mg/dL — ABNORMAL LOW (ref 8.4–10.5)
Chloride: 104 mEq/L (ref 96–112)
Creatinine, Ser: 0.71 mg/dL (ref 0.50–1.35)
GFR calc Af Amer: >90 mL/min (ref >90)
GFR calc non Af Amer: >90 mL/min (ref >90)
Glucose, Bld: 152 mg/dL — ABNORMAL HIGH (ref 70–99)
Potassium: 3.5 mEq/L (ref 3.5–5.1)
Sodium: 137 mEq/L (ref 135–145)

## 2012-01-08 LAB — POCT I-STAT 4, (NA,K, GLUC, HGB,HCT)
Glucose, Bld: 116 mg/dL — ABNORMAL HIGH (ref 70–99)
HCT: 25 % — ABNORMAL LOW (ref 39.0–52.0)
Hemoglobin: 8.5 g/dL — ABNORMAL LOW (ref 13.0–17.0)
Potassium: 3.2 mEq/L — ABNORMAL LOW (ref 3.5–5.1)
Sodium: 141 mEq/L (ref 135–145)

## 2012-01-08 LAB — PROTIME-INR
INR: 1.09 (ref 0.00–1.49)
INR: 1.19 (ref 0.00–1.49)
Prothrombin Time: 14 seconds (ref 11.6–15.2)
Prothrombin Time: 14.9 seconds (ref 11.6–15.2)

## 2012-01-08 LAB — POCT I-STAT 3, ART BLOOD GAS (G3+)
Acid-base deficit: 1 mmol/L (ref 0.0–2.0)
Bicarbonate: 25 mEq/L — ABNORMAL HIGH (ref 20.0–24.0)
O2 Saturation: 94 %
Patient temperature: 37.2
TCO2: 26 mmol/L (ref 0–100)
pCO2 arterial: 44.6 mmHg (ref 35.0–45.0)
pH, Arterial: 7.358 (ref 7.350–7.450)
pO2, Arterial: 74 mmHg — ABNORMAL LOW (ref 80.0–100.0)

## 2012-01-08 LAB — APTT
aPTT: 33 seconds (ref 24–37)
aPTT: 44 seconds — ABNORMAL HIGH (ref 24–37)

## 2012-01-08 LAB — CBC
HCT: 36.7 % — ABNORMAL LOW (ref 39.0–52.0)
Hemoglobin: 12.6 g/dL — ABNORMAL LOW (ref 13.0–17.0)
MCH: 31.2 pg (ref 26.0–34.0)
MCHC: 34.3 g/dL (ref 30.0–36.0)
MCV: 90.8 fL (ref 78.0–100.0)
Platelets: 124 10*3/uL — ABNORMAL LOW (ref 150–400)
RBC: 4.04 MIL/uL — ABNORMAL LOW (ref 4.22–5.81)
RDW: 14.9 % (ref 11.5–15.5)
WBC: 15.7 10*3/uL — ABNORMAL HIGH (ref 4.0–10.5)

## 2012-01-08 LAB — MAGNESIUM: Magnesium: 1.6 mg/dL (ref 1.5–2.5)

## 2012-01-08 SURGERY — CREATION, BYPASS, ARTERIAL, AORTA TO FEMORAL, BILATERAL, USING GRAFT
Anesthesia: General | Laterality: Bilateral | Wound class: Clean

## 2012-01-08 MED ORDER — SODIUM CHLORIDE 0.9 % IV SOLN: INTRAVENOUS | Status: DC

## 2012-01-08 MED ORDER — PANTOPRAZOLE SODIUM 40 MG PO TBEC
40.0000 mg | DELAYED_RELEASE_TABLET | Freq: Every day | ORAL | Status: DC
  Administered 2012-01-12: 40 mg via ORAL
  Filled 2012-01-08 (×2): qty 1

## 2012-01-08 MED ORDER — ALUM & MAG HYDROXIDE-SIMETH 200-200-20 MG/5ML PO SUSP: 15.0000 mL | ORAL | Status: DC | PRN

## 2012-01-08 MED ORDER — PROTAMINE SULFATE 10 MG/ML IV SOLN
INTRAVENOUS | Status: DC | PRN
  Administered 2012-01-08: 40 mg via INTRAVENOUS
  Administered 2012-01-08: 10 mg via INTRAVENOUS

## 2012-01-08 MED ORDER — NEOSTIGMINE METHYLSULFATE 1 MG/ML IJ SOLN
INTRAMUSCULAR | Status: DC | PRN
  Administered 2012-01-08: 5 mg via INTRAVENOUS

## 2012-01-08 MED ORDER — DEXTROSE 5 % IV SOLN
1.5000 g | Freq: Two times a day (BID) | INTRAVENOUS | Status: AC
  Administered 2012-01-08 – 2012-01-09 (×2): 1.5 g via INTRAVENOUS
  Filled 2012-01-08 (×2): qty 1.5

## 2012-01-08 MED ORDER — LACTATED RINGERS IV SOLN
INTRAVENOUS | Status: DC | PRN
  Administered 2012-01-08: 08:00:00 via INTRAVENOUS

## 2012-01-08 MED ORDER — HYDRALAZINE HCL 20 MG/ML IJ SOLN
10.0000 mg | INTRAMUSCULAR | Status: DC | PRN
  Filled 2012-01-08: qty 0.5

## 2012-01-08 MED ORDER — HYDROMORPHONE HCL PF 1 MG/ML IJ SOLN
0.2500 mg | INTRAMUSCULAR | Status: DC | PRN
  Administered 2012-01-08 (×5): 0.5 mg via INTRAVENOUS

## 2012-01-08 MED ORDER — THROMBIN 20000 UNITS EX SOLR
CUTANEOUS | Status: AC
  Filled 2012-01-08: qty 20000

## 2012-01-08 MED ORDER — LACTATED RINGERS IV SOLN
INTRAVENOUS | Status: DC | PRN
  Administered 2012-01-08 (×2): via INTRAVENOUS

## 2012-01-08 MED ORDER — METOPROLOL TARTRATE 1 MG/ML IV SOLN
2.5000 mg | Freq: Four times a day (QID) | INTRAVENOUS | Status: DC
  Administered 2012-01-08 – 2012-01-11 (×13): 2.5 mg via INTRAVENOUS
  Administered 2012-01-12: 12:00:00 via INTRAVENOUS
  Administered 2012-01-12 – 2012-01-13 (×4): 2.5 mg via INTRAVENOUS
  Filled 2012-01-08 (×23): qty 5

## 2012-01-08 MED ORDER — POTASSIUM CHLORIDE CRYS ER 20 MEQ PO TBCR: 20.0000 meq | EXTENDED_RELEASE_TABLET | Freq: Once | ORAL | Status: AC | PRN

## 2012-01-08 MED ORDER — ARTIFICIAL TEARS OP OINT
TOPICAL_OINTMENT | OPHTHALMIC | Status: DC | PRN
  Administered 2012-01-08: 1 via OPHTHALMIC

## 2012-01-08 MED ORDER — ONDANSETRON HCL 4 MG/2ML IJ SOLN
INTRAMUSCULAR | Status: DC | PRN
  Administered 2012-01-08: 4 mg via INTRAVENOUS

## 2012-01-08 MED ORDER — DEXTROSE 5 % IV SOLN
0.2500 ug/kg/min | INTRAVENOUS | Status: AC
  Administered 2012-01-08: 0.249 ug/kg/min via INTRAVENOUS
  Filled 2012-01-08: qty 2

## 2012-01-08 MED ORDER — HYDROMORPHONE HCL PF 1 MG/ML IJ SOLN
INTRAMUSCULAR | Status: AC
  Filled 2012-01-08: qty 2

## 2012-01-08 MED ORDER — FENTANYL CITRATE 0.05 MG/ML IJ SOLN
INTRAMUSCULAR | Status: DC | PRN
  Administered 2012-01-08: 250 ug via INTRAVENOUS
  Administered 2012-01-08: 50 ug via INTRAVENOUS
  Administered 2012-01-08 (×2): 150 ug via INTRAVENOUS
  Administered 2012-01-08: 100 ug via INTRAVENOUS
  Administered 2012-01-08: 50 ug via INTRAVENOUS
  Administered 2012-01-08: 250 ug via INTRAVENOUS

## 2012-01-08 MED ORDER — PROPOFOL 10 MG/ML IV BOLUS
INTRAVENOUS | Status: DC | PRN
  Administered 2012-01-08: 180 mg via INTRAVENOUS

## 2012-01-08 MED ORDER — MAGNESIUM SULFATE 40 MG/ML IJ SOLN: 2.0000 g | Freq: Once | INTRAMUSCULAR | Status: AC | PRN

## 2012-01-08 MED ORDER — CEFUROXIME SODIUM 1.5 G IJ SOLR
1.5000 g | Freq: Two times a day (BID) | INTRAMUSCULAR | Status: DC
Start: 2012-01-08 — End: 2012-01-08
  Filled 2012-01-08: qty 1.5

## 2012-01-08 MED ORDER — PHENOL 1.4 % MT LIQD: 1.0000 | OROMUCOSAL | Status: DC | PRN

## 2012-01-08 MED ORDER — LABETALOL HCL 5 MG/ML IV SOLN
10.0000 mg | INTRAVENOUS | Status: DC | PRN
  Filled 2012-01-08: qty 4

## 2012-01-08 MED ORDER — GLYCOPYRROLATE 0.2 MG/ML IJ SOLN
INTRAMUSCULAR | Status: DC | PRN
  Administered 2012-01-08: 0.6 mg via INTRAVENOUS

## 2012-01-08 MED ORDER — LABETALOL HCL 5 MG/ML IV SOLN
INTRAVENOUS | Status: DC | PRN
  Administered 2012-01-08 (×2): 10 mg via INTRAVENOUS

## 2012-01-08 MED ORDER — SODIUM CHLORIDE 0.9 % IV SOLN
INTRAVENOUS | Status: DC | PRN
  Administered 2012-01-08 (×2): via INTRAVENOUS

## 2012-01-08 MED ORDER — MORPHINE SULFATE 2 MG/ML IJ SOLN
2.0000 mg | INTRAMUSCULAR | Status: DC | PRN
  Administered 2012-01-08 (×2): 2 mg via INTRAVENOUS
  Administered 2012-01-09 (×2): 4 mg via INTRAVENOUS
  Administered 2012-01-09: 2 mg via INTRAVENOUS
  Administered 2012-01-09: 4 mg via INTRAVENOUS
  Administered 2012-01-09 (×2): 2 mg via INTRAVENOUS
  Administered 2012-01-09 – 2012-01-10 (×4): 4 mg via INTRAVENOUS
  Administered 2012-01-10: 2 mg via INTRAVENOUS
  Administered 2012-01-10 (×2): 4 mg via INTRAVENOUS
  Administered 2012-01-11 – 2012-01-12 (×4): 2 mg via INTRAVENOUS
  Filled 2012-01-08 (×3): qty 1
  Filled 2012-01-08 (×2): qty 2
  Filled 2012-01-08: qty 1
  Filled 2012-01-08 (×2): qty 2
  Filled 2012-01-08: qty 1
  Filled 2012-01-08: qty 2
  Filled 2012-01-08 (×2): qty 1
  Filled 2012-01-08: qty 2
  Filled 2012-01-08: qty 1
  Filled 2012-01-08 (×2): qty 2
  Filled 2012-01-08: qty 1
  Filled 2012-01-08: qty 2
  Filled 2012-01-08: qty 1

## 2012-01-08 MED ORDER — DOPAMINE-DEXTROSE 3.2-5 MG/ML-% IV SOLN: 3.0000 ug/kg/min | INTRAVENOUS | Status: DC

## 2012-01-08 MED ORDER — SODIUM CHLORIDE 0.9 % IV SOLN: 500.0000 mL | Freq: Once | INTRAVENOUS | Status: AC | PRN

## 2012-01-08 MED ORDER — GUAIFENESIN-DM 100-10 MG/5ML PO SYRP: 15.0000 mL | ORAL_SOLUTION | ORAL | Status: DC | PRN

## 2012-01-08 MED ORDER — ALBUMIN HUMAN 5 % IV SOLN
INTRAVENOUS | Status: DC | PRN
  Administered 2012-01-08: 11:00:00 via INTRAVENOUS

## 2012-01-08 MED ORDER — ACETAMINOPHEN 325 MG PO TABS: 325.0000 mg | ORAL_TABLET | ORAL | Status: DC | PRN

## 2012-01-08 MED ORDER — NITROPRUSSIDE SODIUM 25 MG/ML IV SOLN
0.2500 ug/kg/min | INTRAVENOUS | Status: DC
  Filled 2012-01-08: qty 2

## 2012-01-08 MED ORDER — ROCURONIUM BROMIDE 100 MG/10ML IV SOLN
INTRAVENOUS | Status: DC | PRN
  Administered 2012-01-08: 10 mg via INTRAVENOUS
  Administered 2012-01-08: 60 mg via INTRAVENOUS
  Administered 2012-01-08 (×2): 10 mg via INTRAVENOUS
  Administered 2012-01-08 (×2): 20 mg via INTRAVENOUS

## 2012-01-08 MED ORDER — LIDOCAINE HCL (CARDIAC) 20 MG/ML IV SOLN
INTRAVENOUS | Status: DC | PRN
  Administered 2012-01-08: 100 mg via INTRAVENOUS

## 2012-01-08 MED ORDER — LABETALOL HCL 5 MG/ML IV SOLN
INTRAVENOUS | Status: AC
  Filled 2012-01-08: qty 4

## 2012-01-08 MED ORDER — 0.9 % SODIUM CHLORIDE (POUR BTL) OPTIME
TOPICAL | Status: DC | PRN
  Administered 2012-01-08 (×2): 1000 mL

## 2012-01-08 MED ORDER — KCL IN DEXTROSE-NACL 20-5-0.45 MEQ/L-%-% IV SOLN
INTRAVENOUS | Status: DC
  Administered 2012-01-08 – 2012-01-13 (×9): via INTRAVENOUS
  Filled 2012-01-08 (×16): qty 1000

## 2012-01-08 MED ORDER — SODIUM CHLORIDE 0.9 % IR SOLN
Status: DC | PRN
  Administered 2012-01-08: 10:00:00

## 2012-01-08 MED ORDER — PHENYLEPHRINE HCL 10 MG/ML IJ SOLN
10.0000 mg | INTRAVENOUS | Status: DC | PRN
  Administered 2012-01-08: 10 ug/min via INTRAVENOUS

## 2012-01-08 MED ORDER — ONDANSETRON HCL 4 MG/2ML IJ SOLN: 4.0000 mg | Freq: Four times a day (QID) | INTRAMUSCULAR | Status: DC | PRN

## 2012-01-08 MED ORDER — OXYCODONE HCL 5 MG/5ML PO SOLN: 5.0000 mg | Freq: Once | ORAL | Status: DC | PRN

## 2012-01-08 MED ORDER — OXYMETAZOLINE HCL 0.05 % NA SOLN
NASAL | Status: DC | PRN
  Administered 2012-01-08: 4 via NASAL

## 2012-01-08 MED ORDER — METOPROLOL TARTRATE 1 MG/ML IV SOLN: 2.0000 mg | INTRAVENOUS | Status: DC | PRN

## 2012-01-08 MED ORDER — MIDAZOLAM HCL 5 MG/5ML IJ SOLN
INTRAMUSCULAR | Status: DC | PRN
  Administered 2012-01-08: 2 mg via INTRAVENOUS

## 2012-01-08 MED ORDER — HEPARIN SODIUM (PORCINE) 1000 UNIT/ML IJ SOLN
INTRAMUSCULAR | Status: DC | PRN
  Administered 2012-01-08: 5000 [IU] via INTRAVENOUS
  Administered 2012-01-08 (×2): 8000 [IU] via INTRAVENOUS

## 2012-01-08 MED ORDER — ACETAMINOPHEN 325 MG RE SUPP
325.0000 mg | RECTAL | Status: DC | PRN
  Filled 2012-01-08: qty 2

## 2012-01-08 MED ORDER — MANNITOL 25 % IV SOLN
INTRAVENOUS | Status: DC | PRN
  Administered 2012-01-08: 12.5 g via INTRAVENOUS

## 2012-01-08 MED ORDER — HYDROMORPHONE HCL PF 1 MG/ML IJ SOLN
INTRAMUSCULAR | Status: AC
  Filled 2012-01-08: qty 1

## 2012-01-08 MED ORDER — DOCUSATE SODIUM 100 MG PO CAPS
100.0000 mg | ORAL_CAPSULE | Freq: Every day | ORAL | Status: DC
  Administered 2012-01-12: 100 mg via ORAL
  Filled 2012-01-08 (×4): qty 1

## 2012-01-08 MED ORDER — METOCLOPRAMIDE HCL 5 MG/ML IJ SOLN: 10.0000 mg | Freq: Once | INTRAMUSCULAR | Status: DC | PRN

## 2012-01-08 MED ORDER — OXYCODONE HCL 5 MG PO TABS: 5.0000 mg | ORAL_TABLET | Freq: Once | ORAL | Status: DC | PRN

## 2012-01-08 SURGICAL SUPPLY — 70 items
ADH SKN CLS LQ APL DERMABOND (GAUZE/BANDAGES/DRESSINGS) ×4
ATTRACTOMAT 16X20 MAGNETIC DRP (DRAPES) ×2 IMPLANT
BAG ISL DRAPE 18X18 STRL (DRAPES) ×2
BAG ISOLATION DRAPE 18X18 (DRAPES) ×2 IMPLANT
CANISTER SUCTION 2500CC (MISCELLANEOUS) ×2 IMPLANT
CANNULA VESSEL W/WING WO/VALVE (CANNULA) ×2 IMPLANT
CATH EMB 3FR 80CM (CATHETERS) ×2 IMPLANT
CLIP TI MEDIUM 24 (CLIP) ×2 IMPLANT
CLIP TI WIDE RED SMALL 24 (CLIP) ×2 IMPLANT
CLOTH BEACON ORANGE TIMEOUT ST (SAFETY) ×2 IMPLANT
COVER SURGICAL LIGHT HANDLE (MISCELLANEOUS) ×2 IMPLANT
DERMABOND ADHESIVE PROPEN (GAUZE/BANDAGES/DRESSINGS) ×4
DERMABOND ADVANCED .7 DNX6 (GAUZE/BANDAGES/DRESSINGS) ×4 IMPLANT
DRAPE ISOLATION BAG 18X18 (DRAPES) ×2
DRAPE WARM FLUID 44X44 (DRAPE) ×2 IMPLANT
DRSG COVADERM 4X10 (GAUZE/BANDAGES/DRESSINGS) ×6 IMPLANT
ELECT BLADE 6.5 EXT (BLADE) ×2 IMPLANT
ELECT REM PT RETURN 9FT ADLT (ELECTROSURGICAL) ×2
ELECTRODE REM PT RTRN 9FT ADLT (ELECTROSURGICAL) ×1 IMPLANT
FELT TEFLON 4 X1 (Mesh General) ×2 IMPLANT
GEL ULTRASOUND 20GR AQUASONIC (MISCELLANEOUS) ×2 IMPLANT
GLOVE BIO SURGEON STRL SZ 6.5 (GLOVE) ×8 IMPLANT
GLOVE BIO SURGEON STRL SZ7.5 (GLOVE) ×2 IMPLANT
GLOVE BIOGEL PI IND STRL 6.5 (GLOVE) ×4 IMPLANT
GLOVE BIOGEL PI IND STRL 7.0 (GLOVE) ×1 IMPLANT
GLOVE BIOGEL PI IND STRL 7.5 (GLOVE) ×2 IMPLANT
GLOVE BIOGEL PI INDICATOR 6.5 (GLOVE) ×4
GLOVE BIOGEL PI INDICATOR 7.0 (GLOVE) ×1
GLOVE BIOGEL PI INDICATOR 7.5 (GLOVE) ×2
GLOVE ECLIPSE 6.5 STRL STRAW (GLOVE) ×2 IMPLANT
GLOVE SURG SS PI 7.5 STRL IVOR (GLOVE) ×6 IMPLANT
GOWN PREVENTION PLUS XLARGE (GOWN DISPOSABLE) ×6 IMPLANT
GOWN STRL NON-REIN LRG LVL3 (GOWN DISPOSABLE) ×6 IMPLANT
GRAFT HEMASHIELD 18X9MM (Vascular Products) ×2 IMPLANT
INSERT FOGARTY 61MM (MISCELLANEOUS) ×2 IMPLANT
INSERT FOGARTY SM (MISCELLANEOUS) ×4 IMPLANT
KIT BASIN OR (CUSTOM PROCEDURE TRAY) ×2 IMPLANT
KIT ROOM TURNOVER OR (KITS) ×2 IMPLANT
LOOP VESSEL MAXI BLUE (MISCELLANEOUS) ×4 IMPLANT
LOOP VESSEL MINI RED (MISCELLANEOUS) ×2 IMPLANT
NS IRRIG 1000ML POUR BTL (IV SOLUTION) ×4 IMPLANT
PACK AORTA (CUSTOM PROCEDURE TRAY) ×2 IMPLANT
PAD ARMBOARD 7.5X6 YLW CONV (MISCELLANEOUS) ×4 IMPLANT
PUNCH AORTIC ROTATE 5MM 8IN (MISCELLANEOUS) ×2 IMPLANT
RETAINER VISCERA MED (MISCELLANEOUS) ×2 IMPLANT
SPONGE LAP 18X18 X RAY DECT (DISPOSABLE) IMPLANT
SPONGE SURGIFOAM ABS GEL 100 (HEMOSTASIS) IMPLANT
STAPLER VISISTAT 35W (STAPLE) ×4 IMPLANT
SUT PDS AB 1 TP1 54 (SUTURE) ×4 IMPLANT
SUT PROLENE 3 0 SH DA (SUTURE) ×4 IMPLANT
SUT PROLENE 3 0 SH1 36 (SUTURE) ×8 IMPLANT
SUT PROLENE 5 0 C 1 24 (SUTURE) ×4 IMPLANT
SUT PROLENE 6 0 C 1 30 (SUTURE) ×2 IMPLANT
SUT PROLENE 6 0 CC (SUTURE) ×4 IMPLANT
SUT SILK 2 0 TIES 17X18 (SUTURE) ×2
SUT SILK 2 0SH CR/8 30 (SUTURE) ×2 IMPLANT
SUT SILK 2-0 18XBRD TIE BLK (SUTURE) ×1 IMPLANT
SUT SILK 3 0 TIES 17X18 (SUTURE) ×1
SUT SILK 3-0 18XBRD TIE BLK (SUTURE) ×1 IMPLANT
SUT VIC AB 2-0 CT1 36 (SUTURE) ×4 IMPLANT
SUT VIC AB 3-0 SH 27 (SUTURE) ×4
SUT VIC AB 3-0 SH 27X BRD (SUTURE) ×4 IMPLANT
SUT VICRYL 4-0 PS2 18IN ABS (SUTURE) ×4 IMPLANT
SYR 3ML LL SCALE MARK (SYRINGE) ×2 IMPLANT
TAPE STRIPS DRAPE STRL (GAUZE/BANDAGES/DRESSINGS) ×2 IMPLANT
TAPE UMBILICAL COTTON 1/8X30 (MISCELLANEOUS) ×2 IMPLANT
TOWEL OR 17X24 6PK STRL BLUE (TOWEL DISPOSABLE) ×6 IMPLANT
TOWEL OR 17X26 10 PK STRL BLUE (TOWEL DISPOSABLE) ×4 IMPLANT
TRAY FOLEY CATH 14FRSI W/METER (CATHETERS) ×2 IMPLANT
WATER STERILE IRR 1000ML POUR (IV SOLUTION) ×4 IMPLANT

## 2012-01-08 NOTE — Progress Notes (Signed)
Increased nipride drip to 7.5cc/hr (.349mcg/kg/min) for art bp of 189/67

## 2012-01-08 NOTE — Progress Notes (Signed)
Late entry -  Initiated nipride drip at 1536 at 5.7 cc / hr infusing into swan sleeve

## 2012-01-08 NOTE — Progress Notes (Signed)
ANTIBIOTIC CONSULT NOTE - INITIAL  Pharmacy Consult for Antibiotic renal dose adjustmetn Indication: s/p aorta bifemoral bypass graft (bilateral)  No Known Allergies  Patient Measurements: Height: 5\' 10"  (177.8 cm) (from preadmit 01/03/12) Weight: 168 lb 6.4 oz (76.386 kg) (from preadmit 01/03/12) IBW/kg (Calculated) : 73    Vital Signs: Temp: 97.2 F (36.2 C) (12/18 1452) Temp src: Oral (12/18 0654) BP: 151/77 mmHg (12/18 1700) Pulse Rate: 72  (12/18 1700) Intake/Output from previous day:   Intake/Output from this shift: Total I/O In: 4450 [I.V.:2850; Blood:1100; IV Piggyback:500] Out: 2050 [Urine:1050; Blood:1000]  Labs:  Basename 01/08/12 1505 01/08/12 1249 01/08/12 1201  WBC 15.7* -- --  HGB 12.6* 10.2* 8.5*  PLT 124* -- --  LABCREA -- -- --  CREATININE 0.71 -- --   Estimated Creatinine Clearance: 96.3 ml/min (by C-G formula based on Cr of 0.71). No results found for this basename: VANCOTROUGH:2,VANCOPEAK:2,VANCORANDOM:2,GENTTROUGH:2,GENTPEAK:2,GENTRANDOM:2,TOBRATROUGH:2,TOBRAPEAK:2,TOBRARND:2,AMIKACINPEAK:2,AMIKACINTROU:2,AMIKACIN:2, in the last 72 hours   Microbiology: Recent Results (from the past 720 hour(s))  SURGICAL PCR SCREEN     Status: Normal   Collection Time   01/03/12  9:03 AM      Component Value Range Status Comment   MRSA, PCR NEGATIVE  NEGATIVE Final    Staphylococcus aureus NEGATIVE  NEGATIVE Final     Medical History: Past Medical History  Diagnosis Date  . Stroke     2004  . Coronary atherosclerosis of native coronary artery     PTCA small diagonal 2007 otherwise nonobstructive CAD, EF 50-55%  . AAA (abdominal aortic aneurysm)   . Carotid artery disease     Nonobstructive  . Essential hypertension, benign     takes Metoprolol daily  . Hyperlipidemia     takes Zocor nightly  . NSTEMI (non-ST elevated myocardial infarction)     2007  . TIA (transient ischemic attack) 2006  . Abdominal aneurysm   . Cataract       Assessment: 66 yo male with known abdominal aortic aneurysm. He also has iliac occlusive disease bilaterally. Today he is s/p aortobifemoral bypass for repair of abdominal aortic aneurysm. He is to receive Cefuroxime 1.5 g IV q12h x 2 doses post op. SCr = 0.71, CrCl ~96 ml/min. Renal function withing normal.  No adjustment of cefuroxime needed.     Goal of Therapy:  Renal dose adjustment of antibiotics  Plan:   No adjustment of cefuroxime needed.   Noah Delaine, RPh Clinical Pharmacist Pager: [phone] 01/08/2012,5:29 PM

## 2012-01-08 NOTE — H&P (View-Only) (Signed)
Patient is a 66-year-old male who returns for followup today. He has a known abdominal aortic aneurysm. He also has iliac occlusive disease bilaterally. He primarily complains of left leg pain. He recently saw Dr. McDowell for cardiac risk stratification. At that time he was placed on a beta blocker as well as a statin. Unfortunately he continues to smoke but is trying to quit. He is on aspirin. He denies any abdominal or back pain. He has very short distance claudication.  Past Medical History  Diagnosis Date  . NSTEMI (non-ST elevated myocardial infarction)     2007  . Stroke     2004  . Coronary atherosclerosis of native coronary artery     PTCA small diagonal 2007 otherwise nonobstructive CAD, EF 50-55%  . Essential hypertension, benign   . AAA (abdominal aortic aneurysm)   . Carotid artery disease     Nonobstructive   History reviewed. No pertinent past surgical history.   Review of systems: He denies shortness of breath. He denies chest pain. Current Outpatient Prescriptions on File Prior to Visit  Medication Sig Dispense Refill  . aspirin 325 MG tablet Take 325 mg by mouth daily.      . ibuprofen (ADVIL,MOTRIN) 200 MG tablet Take 400 mg by mouth daily as needed. FOR PAIN      . Menthol, Topical Analgesic, (ICY HOT) 5 % PADS Apply 1 application topically daily as needed. FOR PAIN      . metoprolol succinate (TOPROL XL) 25 MG 24 hr tablet Take 1 tablet (25 mg total) by mouth daily.  30 tablet  3  . simvastatin (ZOCOR) 20 MG tablet Take 1 tablet (20 mg total) by mouth at bedtime.  90 tablet  3    No Known Allergies  Physical exam:  Filed Vitals:   12/26/11 1319  BP: 158/78  Pulse: 56  Height: 5' 10" (1.778 m)  Weight: 172 lb (78.019 kg)  SpO2: 99%   Abdomen soft nontender nondistended vaguely palpable pulsatile mass  Extremity: Absent right femoral pulse 1+ left femoral pulse absent popliteal and pedal pulses  Data: Bilateral ABIs were performed today ABI on the left  was 0.95 right was 0.65  Assessment: Abdominal aortic aneurysm with iliac occlusive disease  Plan: #1 patient was given a prescription for Percocet today #30 dispensed to last him until the time of his operation #2 aortobifemoral bypass for repair of abdominal aortic aneurysm on Wednesday, December 18 pending the results of his stress test which is scheduled for Monday, December 9.  Charles Fields, MD [facility] Office: [phone] Pager: [phone]  

## 2012-01-08 NOTE — Op Note (Signed)
Procedure: Aortobifemoral bypass, reimplantation of Inferior mesenteric artery  Preoperative diagnosis: AAA, Claudication bilateral lower extremities  Postoperative diagnosis: Same  Anesthesia: Gen.  Assistant: Newton Pigg PA-C  Operative findings: #1 proximal anastomosis end to end with 18x 9 mm dacron graft #2 Inferior mesenteric artery reimplanted to left iliac limb of bifurcated graft  Operative details: After obtaining informed consent, the patient was taken to the operating room. The patient was placed in supine position on the operating table. After induction of general anesthesia and endotracheal ablation the patient was prepped and draped in usual sterile fashion from the nipples to the toes. Next a longitudinal incision was made in the right groin carried into the subcutaneous tissues down to level the right common femoral artery. The artery was dissected free circumferentially. There were some adhesions to the anterior wall the artery but overall it was soft but thick. The profunda femoris and superficial femoral arteries were dissected free circumferentially. These were fairly small approximately 3-4 mm in diameter. The common femoral artery was approximately 6 mm in diameter. Several small side branches in the circumflex iliac branches were also dissected free circumferentially and vessel loops placed around these. There is no pulse within the right common femoral artery. A similar incision was made in the left groin. Incision was carried down into the subcutaneous tissues to level left common femoral artery. This did have a weak pulse within it. This was dissected free circumferentially. Several large side branches were dissected free circumferentially controlled with vessel loops. Superficial femoral and profunda femoris arteries were also dissected free circumferentially and vessel loops placed around these. A vessel loop was also placed around the distal external iliac artery.  Retroperitoneal tunnels were started in the groin on both sides with blunt dissection on the dorsal aspect of the artery.  Next a midline laparotomy incision was made extending from the xiphoid to midway in the umbilicus and pubis. The incision was carried down to the level of the fascia the fascia was incised for the full length of the incision. There were some omental adhesions up the abdominal wall these are taken down with cautery. Next the omentum was reflected superiorly as well as a transverse colon. The small bowel is reflected to the right. The Omni-retractor was brought on to the operative field for assistance in retraction. The retroperitoneum was entered. The inferior mesenteric artery was identified and a vessel loop placed around this. Dissection was carried up on the anterior wall of the aorta up to the level of the left renal vein. Aorta was dissected free on its anterior two thirds surface from this level all the way down to the aortic bifurcation. Several small lumbar arteries were controlled with clips.  Retroperitoneal tunnels were created down the left and right iliac arteries to the groin. This was done a retroureteral retrocolic fashion. An umbilical table placed in the retroperitoneal tunnels.  An umbilical tape was placed around each common iliac artery  The patient was given 8000 units of intravenous heparin. He was given 2 additional 5000 unit boluses during the case.  After appropriate circulation time, the common iliacs were clamped. A vessel loop was used to control the inferior mesenteric artery. An aortic clamp was placed just below the renal arteries at the level of the renal vein. A longitudinal opening was made in the aorta just below the renal arteries.  A 18 x 9 mm dacron graft was brought onto the operative field. This was then sewn end of graft to end  of artery using a running 3-0 Prolene suture. This was done end to end fashion. Just prior to completion of the anastomosis  this was thoroughly flushed. Anastomosis was secured, clamps released, and there was good pulsatile flow the proximal graft. There was good hemostasis after 2 repair sutures. The limbs of the graft were then brought out through the retro-peroneal tunnels to each groin. Attention was then turned to the right groin. The distal external iliac artery was controlled with a vessel loop. The common femoral artery profunda femoris and superficial femoral arteries were controlled with vessel loops. A longitudinal opening was made in the common femoral artery the graft was cut to length and beveled and sewn end graft to side of artery using a running 5-0 Prolene suture. Just prior to completion of the anastomosis it was forebled backbled and thoroughly flushed.   The anastomosis was secured, clamps released, and there was good flow into the distal limb immediately. Patient had posterior tibial pulse doppler signal immediately. Hemostasis was obtained in the right groin. Attention was then turned to the left groin. In similar fashion the graft was pulled down and cut to length. A longitudinal opening was made in the left common femoral artery and the graft beveled and sewn end of graft to side of artery using a running 5-0 Prolene suture. Just prior to completion of the anastomosis, it was forebled backbled and thoroughly flushed.   The anastomosis was secured, vessel loops released and there was good pulsatile flow in the left common femoral artery immediately. Patient also had a posterior tibial doppler at this point which was slightly less than the right. Hemostasis was obtained the left groin. The abdomen was inspected.   Next attention was turned to the inferior mesenteric artery.  I endartectomized this and passed a 3 Fogarty catheter and got vigorous backbleeding from this.  However preop imageing had show significant external and internal iliac occlusive disease so I elected to reimplant this.  A Carrel patch was  fashioned.  The left iliac limb of the graft was clamped proximally and distally.  A hole was made in the anterolateral aspect of the graft with a punch.  The IMA was then sewn end to side using a running 6 0 Prolene.  This usual flushing procedures were performed and the anastomosis secured.  There was a pulse in the IMA with good doppler flow.  The patient's heparin was partially reversed with 50 mg of protamine. Hemostasis had been obtained in all incisions at this point. The retroperitoneum was closed with a running 3-0 Vicryl suture. The viscera were returned to their normal location and the Omni retractor was removed. The sigmoid colon was inspected and found to be pink with no evidence of ischemia. The fascia was then reapproximated using a running #1 PDS suture. The skin was closed with staples. Each groin was then again inspected and thoroughly irrigated. Both were found to be hemostatic. The deep layers the groins were closed in multiple layers or running 2 0 and 3 0 Vicryl suture and a 4 0 Vicryl subcuticular stitch in the skin.  The patient tolerated procedure well and there were no complications. Sponge and needle counts were correct at the end of the case. The patient was taken to the recovery room after extubation in stable condition.   Fabienne Bruns, MD [facility] [place] Office: [phone] Pager: [phone]

## 2012-01-08 NOTE — Progress Notes (Signed)
Increased nipride drip to 6.5cc/hr in swan sleeve

## 2012-01-08 NOTE — Anesthesia Preprocedure Evaluation (Addendum)
Anesthesia Evaluation  Patient identified by MRN, date of birth, ID band Patient awake    Reviewed: Allergy & Precautions, H&P , NPO status , Patient's Chart, lab work & pertinent test results, reviewed documented beta blocker date and time   Airway Mallampati: II TM Distance: >3 FB Neck ROM: full    Dental   Pulmonary neg pulmonary ROS,  breath sounds clear to auscultation        Cardiovascular hypertension, On Medications and On Home Beta Blockers + CAD, + Past MI and + Peripheral Vascular Disease Rhythm:regular     Neuro/Psych TIAnegative psych ROS   GI/Hepatic negative GI ROS, Neg liver ROS,   Endo/Other  negative endocrine ROS  Renal/GU negative Renal ROS  negative genitourinary   Musculoskeletal   Abdominal   Peds  Hematology negative hematology ROS (+)   Anesthesia Other Findings See surgeon's H&P   Reproductive/Obstetrics negative OB ROS                           Anesthesia Physical Anesthesia Plan  ASA: III  Anesthesia Plan: General   Post-op Pain Management:    Induction: Intravenous  Airway Management Planned: Oral ETT  Additional Equipment: Arterial line, CVP, PA Cath and Ultrasound Guidance Line Placement  Intra-op Plan:   Post-operative Plan: Possible Post-op intubation/ventilation  Informed Consent: I have reviewed the patients History and Physical, chart, labs and discussed the procedure including the risks, benefits and alternatives for the proposed anesthesia with the patient or authorized representative who has indicated his/her understanding and acceptance.   Dental Advisory Given  Plan Discussed with: CRNA and Surgeon  Anesthesia Plan Comments:         Anesthesia Quick Evaluation

## 2012-01-08 NOTE — Interval H&P Note (Signed)
History and Physical Interval Note:  01/08/2012 7:50 AM  Darryl Ward  has presented today for surgery, with the diagnosis of CLAUDICATION  The various methods of treatment have been discussed with the patient and family. After consideration of risks, benefits and other options for treatment, the patient has consented to  Procedure(s) (LRB) with comments: AORTA BIFEMORAL BYPASS GRAFT (Bilateral) as a surgical intervention .  The patient's history has been reviewed, patient examined, no change in status, stable for surgery.  I have reviewed the patient's chart and labs.  Questions were answered to the patient's satisfaction.     [name]

## 2012-01-08 NOTE — Preoperative (Signed)
Beta Blockers   Reason not to administer Beta Blockers:Not Applicable 

## 2012-01-08 NOTE — Progress Notes (Signed)
Art bp is 193/69 increased nipride drip to 7cc / hr or .390mcg/kg/min

## 2012-01-08 NOTE — Brief Op Note (Signed)
01/08/2012  2:36 PM  PATIENT:  Darryl Ward  66 y.o. male  PRE-OPERATIVE DIAGNOSIS:  Claudication; Abdominal Aortic Aneurysm  POST-OPERATIVE DIAGNOSIS:  Claudication; Abdominal Aortic Aneurysm  PROCEDURE:  Procedure(s) (LRB) with comments: AORTA BIFEMORAL BYPASS GRAFT (Bilateral) - using 18x15mm x 40cm Hemashield Gold Vascular Graft with Endarterectomy, Thombectomy and  Reimplantation of Inferior Mesenteric Artery  SURGEON:  Surgeon(s) and Role:    * Sherren Kerns, MD - Primary  Doppler PT right greater than left at conclusion of case  Fabienne Bruns, MD [facility] [place] Office: [phone] Pager: [phone]

## 2012-01-08 NOTE — Anesthesia Postprocedure Evaluation (Signed)
Anesthesia Post Note  Patient: Darryl Ward  Procedure(s) Performed: Procedure(s) (LRB): AORTA BIFEMORAL BYPASS GRAFT (Bilateral)  Anesthesia type: general  Patient location: PACU  Post pain: Pain level controlled  Post assessment: Patient's Cardiovascular Status Stable  Last Vitals:  Filed Vitals:   01/08/12 1608  BP: 165/84  Pulse: 75  Temp:   Resp: 11    Post vital signs: Reviewed and stable  Level of consciousness: sedated  Complications: No apparent anesthesia complications

## 2012-01-08 NOTE — Transfer of Care (Signed)
Immediate Anesthesia Transfer of Care Note  Patient: Darryl Ward  Procedure(s) Performed: Procedure(s) (LRB) with comments: AORTA BIFEMORAL BYPASS GRAFT (Bilateral) - using 18x66mm x 40cm Hemashield Gold Vascular Graft with Endarterectomy, Thombectomy and  Reimplantation of Inferior Mesenteric Artery  Patient Location: PACU  Anesthesia Type:General  Level of Consciousness: awake, sedated, patient cooperative and confused  Airway & Oxygen Therapy: Patient Spontanous Breathing, Patient connected to nasal cannula oxygen and Patient connected to face mask oxygen  Post-op Assessment: Report given to PACU RN, Post -op Vital signs reviewed and stable, Patient moving all extremities and Patient moving all extremities X 4  Post vital signs: Reviewed and stable  Complications: No apparent anesthesia complications

## 2012-01-09 ENCOUNTER — Telehealth: Payer: Self-pay | Admitting: Vascular Surgery

## 2012-01-09 ENCOUNTER — Other Ambulatory Visit (HOSPITAL_COMMUNITY): Payer: BC Managed Care – PPO

## 2012-01-09 ENCOUNTER — Inpatient Hospital Stay (HOSPITAL_COMMUNITY): Payer: BC Managed Care – PPO

## 2012-01-09 ENCOUNTER — Encounter (HOSPITAL_COMMUNITY): Payer: Self-pay | Admitting: Vascular Surgery

## 2012-01-09 LAB — TYPE AND SCREEN
ABO/RH(D): O NEG
Antibody Screen: NEGATIVE
Unit division: 0
Unit division: 0

## 2012-01-09 LAB — COMPREHENSIVE METABOLIC PANEL WITH GFR
ALT: 5 U/L (ref 0–53)
AST: 10 U/L (ref 0–37)
Albumin: 2.7 g/dL — ABNORMAL LOW (ref 3.5–5.2)
Alkaline Phosphatase: 33 U/L — ABNORMAL LOW (ref 39–117)
BUN: 12 mg/dL (ref 6–23)
CO2: 25 meq/L (ref 19–32)
Calcium: 8.1 mg/dL — ABNORMAL LOW (ref 8.4–10.5)
Chloride: 104 meq/L (ref 96–112)
Creatinine, Ser: 0.81 mg/dL (ref 0.50–1.35)
GFR calc Af Amer: >90 mL/min
GFR calc non Af Amer: >90 mL/min
Glucose, Bld: 138 mg/dL — ABNORMAL HIGH (ref 70–99)
Potassium: 3.6 meq/L (ref 3.5–5.1)
Sodium: 137 meq/L (ref 135–145)
Total Bilirubin: 0.6 mg/dL (ref 0.3–1.2)
Total Protein: 5.2 g/dL — ABNORMAL LOW (ref 6.0–8.3)

## 2012-01-09 LAB — CBC
HCT: 34.7 % — ABNORMAL LOW (ref 39.0–52.0)
Hemoglobin: 12.2 g/dL — ABNORMAL LOW (ref 13.0–17.0)
MCH: 31.5 pg (ref 26.0–34.0)
MCHC: 35.2 g/dL (ref 30.0–36.0)
MCV: 89.7 fL (ref 78.0–100.0)
Platelets: 120 10*3/uL — ABNORMAL LOW (ref 150–400)
RBC: 3.87 MIL/uL — ABNORMAL LOW (ref 4.22–5.81)
RDW: 14.8 % (ref 11.5–15.5)
WBC: 15.9 10*3/uL — ABNORMAL HIGH (ref 4.0–10.5)

## 2012-01-09 LAB — AMYLASE: Amylase: 40 U/L (ref 0–105)

## 2012-01-09 LAB — MAGNESIUM: Magnesium: 1.5 mg/dL (ref 1.5–2.5)

## 2012-01-09 MED ORDER — POTASSIUM CHLORIDE CRYS ER 20 MEQ PO TBCR: 20.0000 meq | EXTENDED_RELEASE_TABLET | Freq: Every day | ORAL | Status: DC | PRN

## 2012-01-09 MED ORDER — MAGNESIUM SULFATE 40 MG/ML IJ SOLN
2.0000 g | Freq: Once | INTRAMUSCULAR | Status: AC | PRN
  Administered 2012-01-09: 2 g via INTRAVENOUS
  Filled 2012-01-09: qty 50

## 2012-01-09 MED ORDER — PANTOPRAZOLE SODIUM 40 MG IV SOLR
40.0000 mg | INTRAVENOUS | Status: DC
Start: 2012-01-09 — End: 2012-01-12
  Administered 2012-01-09 – 2012-01-11 (×3): 40 mg via INTRAVENOUS
  Filled 2012-01-09 (×4): qty 40

## 2012-01-09 MED ORDER — POTASSIUM CHLORIDE 10 MEQ/50ML IV SOLN
10.0000 meq | INTRAVENOUS | Status: AC
  Administered 2012-01-09 (×2): 10 meq via INTRAVENOUS
  Filled 2012-01-09: qty 100

## 2012-01-09 MED FILL — Sodium Chloride IV Soln 0.9%: INTRAVENOUS | Qty: 1000 | Status: AC

## 2012-01-09 MED FILL — Sodium Chloride Irrigation Soln 0.9%: Qty: 3000 | Status: AC

## 2012-01-09 MED FILL — Heparin Sodium (Porcine) Inj 1000 Unit/ML: INTRAMUSCULAR | Qty: 30 | Status: AC

## 2012-01-09 NOTE — Progress Notes (Signed)
OT Cancellation Note  Patient Details Name: Darryl Ward MRN: [id number] DOB: April 02, 1945   Cancelled Treatment:    Reason Eval/Treat Not Completed: Medical issues which prohibited therapy - RN getting ready to pull [employer]. Will re-attempt  Boykin Reaper [phone] 01/09/2012, 4:10 PM

## 2012-01-09 NOTE — Progress Notes (Addendum)
[facility] Progress Note   CV:    120's-180's systolic HR 60-90 regular  Nipride gtt  Filed Vitals:   01/09/12 0700  BP: 145/71  Pulse: 96  Temp: 99.3 F (37.4 C)  Resp: 18    LUNGS:  extubated No CXR results this am CTAB  ABG    Component Value Date/Time   PHART 7.358 01/08/2012 1828   PCO2ART 44.6 01/08/2012 1828   PO2ART 74.0* 01/08/2012 1828   HCO3 25.0* 01/08/2012 1828   TCO2 26 01/08/2012 1828   ACIDBASEDEF 1.0 01/08/2012 1828   O2SAT 94.0 01/08/2012 1828   chest X-ray  NEURO:   In tact  RENAL:  Intake/Output Summary (Last 24 hours) at 01/09/12 0732 Last data filed at 01/09/12 0700  Gross per 24 hour  Intake 6146.43 ml  Output   3300 ml  Net 2846.43 ml   No data found.  BMET    Component Value Date/Time   NA 137 01/09/2012 0350   K 3.6 01/09/2012 0350   CL 104 01/09/2012 0350   CO2 25 01/09/2012 0350   GLUCOSE 138* 01/09/2012 0350   BUN 12 01/09/2012 0350   CREATININE 0.81 01/09/2012 0350   CALCIUM 8.1* 01/09/2012 0350   GFRNONAA >90 01/09/2012 0350   GFRAA >90 01/09/2012 0350     HEME/ID:    Tm 100.2 now 99.3 . CBC    Component Value Date/Time   WBC 15.9* 01/09/2012 0350   RBC 3.87* 01/09/2012 0350   HGB 12.2* 01/09/2012 0350   HCT 34.7* 01/09/2012 0350   PLT 120* 01/09/2012 0350   MCV 89.7 01/09/2012 0350   MCH 31.5 01/09/2012 0350   MCHC 35.2 01/09/2012 0350   RDW 14.8 01/09/2012 0350   LYMPHSABS 1.9 12/10/2011 2028   MONOABS 0.6 12/10/2011 2028   EOSABS 0.2 12/10/2011 2028   BASOSABS 0.1 12/10/2011 2028    Blood Culture No results found for this basename: sdes, specrequest, cult, reptstatus    ABDOMEN: Soft;NT/ND; -BS  EXTREMITIES: Palpable right DP/PT; left + doppler DP/PT   A/P: 66 y.o. Ward who is s/p Aortobifemoral bypass, reimplantation of Inferior mesenteric artery POD 1  -doing well this am -wean nipride gtt.  Continue to go by cuff pressure-A line not accurate this am -d/c  A-line/swan if okay with Dr. Darrick Penna -continue NPO -mobilize/OOB/IS 10x/hr   [name], PA-C [facility] [phone]  Palpable left PT pulse, right DP pulse Stable overnight, requiring nipride to keep BP down D/c aline, d/c swan and sleeve Wean Nipride To 2000 when off Nipride Leave NG until tomorrow Acute blood loss anemia follow for now  Fabienne Bruns, MD [facility] [place] Office: [phone] Pager: [phone]

## 2012-01-09 NOTE — Progress Notes (Signed)
UR COMPLETED  

## 2012-01-09 NOTE — Progress Notes (Signed)
Physical Therapy Evaluation Patient Details Name: Darryl Ward MRN: [id number] DOB: [birth year] Today's Date: 01/09/2012   -            Visit Information  Last PT Received On: 01/09/12 Reason Eval/Treat Not Completed: Medical issues which prohibited therapy (Waiting for lines to be pulled all am.  )    Subjective Data  Patient Stated Goal: To get better first, then return home   Prior Functioning  Home Living Lives With: Friend(s) Available Help at Discharge: Available PRN/intermittently (friend and sister PRN) Type of Home: House Home Access: Stairs to enter Secretary/administrator of Steps: 3 Entrance Stairs-Rails: Can reach both Home Layout: One level Bathroom Shower/Tub: Engineer, manufacturing systems: Standard Bathroom Accessibility: Yes How Accessible: Accessible via walker Home Adaptive Equipment: None Prior Function Level of Independence: Independent Able to Take Stairs?: Yes Driving: Yes Vocation: Full time employment Communication Communication: No difficulties Dominant Hand: Right    Cognition  Overall Cognitive Status: Appears within functional limits for tasks assessed/performed Arousal/Alertness: Awake/alert Orientation Level: Oriented X4 / Intact Behavior During Session: [facility] for tasks performed    Extremity/Trunk Assessment Right Lower Extremity Assessment RLE Sensation: WFL - Light Touch Left Lower Extremity Assessment LLE Sensation: WFL - Light Touch       End of Session  Only able to complete information.  Unable to complete evaluation secondary to waiting for lines to be pulled all am.  Will return at a later date.         INGOLD,Shuayb Schepers 01/09/2012, 1:11 PM [facility] Acute Rehabilitation [phone] (pager)

## 2012-01-09 NOTE — Telephone Encounter (Addendum)
Message copied by Shari Prows on Thu Jan 09, 2012  4:37 PM ------      Message from: Lorin Mercy K      Created: Thu Jan 09, 2012  4:11 PM      Regarding: schedule                   ----- Message -----         From: Dara Lords, PA         Sent: 01/09/2012   3:54 PM           To: Sharee Pimple, CMA            S/p aortobifem 01/08/12 by Dr. Darrick Penna.  F/u with him in 2 weeks.  Will need staples removed.            Thanks,      Lelon Mast  I scheduled an appt for the above pt on 01/23/12 at 9:20am. I left a message on the pt's phone and also mailed an appt letter.awt

## 2012-01-09 NOTE — Significant Event (Signed)
1305pm-pt arrived safely to 2038 via wheelchair. VS stable prior and during the transfer. Pt had on dentures; no other personal belongings that could be taken up to his new room. Pt settled in chair in 2038, placed on telemetry, call-bell within reach. Receiving staff aware pt in room. Pt friend [name] at bedside. Avonte Sensabaugh, Charity fundraiser.

## 2012-01-10 LAB — CBC
HCT: 35.7 % — ABNORMAL LOW (ref 39.0–52.0)
Hemoglobin: 12.4 g/dL — ABNORMAL LOW (ref 13.0–17.0)
MCH: 31 pg (ref 26.0–34.0)
MCHC: 34.7 g/dL (ref 30.0–36.0)
MCV: 89.3 fL (ref 78.0–100.0)
Platelets: 110 10*3/uL — ABNORMAL LOW (ref 150–400)
RBC: 4 MIL/uL — ABNORMAL LOW (ref 4.22–5.81)
RDW: 14.6 % (ref 11.5–15.5)
WBC: 17.7 10*3/uL — ABNORMAL HIGH (ref 4.0–10.5)

## 2012-01-10 LAB — URINALYSIS, ROUTINE W REFLEX MICROSCOPIC
Bilirubin Urine: NEGATIVE
Glucose, UA: NEGATIVE mg/dL
Ketones, ur: NEGATIVE mg/dL
Nitrite: NEGATIVE
Protein, ur: 30 mg/dL — AB
Specific Gravity, Urine: 1.017 (ref 1.005–1.030)
Urobilinogen, UA: 1 mg/dL (ref 0.0–1.0)
pH: 7 (ref 5.0–8.0)

## 2012-01-10 LAB — BASIC METABOLIC PANEL
BUN: 11 mg/dL (ref 6–23)
CO2: 24 mEq/L (ref 19–32)
Calcium: 8.4 mg/dL (ref 8.4–10.5)
Chloride: 102 mEq/L (ref 96–112)
Creatinine, Ser: 0.76 mg/dL (ref 0.50–1.35)
GFR calc Af Amer: >90 mL/min (ref >90)
GFR calc non Af Amer: >90 mL/min (ref >90)
Glucose, Bld: 117 mg/dL — ABNORMAL HIGH (ref 70–99)
Potassium: 3.4 mEq/L — ABNORMAL LOW (ref 3.5–5.1)
Sodium: 134 mEq/L — ABNORMAL LOW (ref 135–145)

## 2012-01-10 LAB — URINE MICROSCOPIC-ADD ON

## 2012-01-10 MED ORDER — OXYCODONE-ACETAMINOPHEN 5-325 MG PO TABS: 1.0000 | ORAL_TABLET | Freq: Four times a day (QID) | ORAL | Status: DC | PRN

## 2012-01-10 NOTE — Plan of Care (Signed)
Problem: Phase II Progression Outcomes Goal: Tolerates clear liquids without nausea/vomiting Outcome: Not Progressing Pt still NPO with NGT to ILWS

## 2012-01-10 NOTE — Progress Notes (Addendum)
[facility] Progress Note  01/10/2012 7:51 AM POD 2  Subjective:  Feels sore  Tm 99.3 now afebrile VSS 94% RA  Off Nipride gtt  Filed Vitals:   01/10/12 0447  BP: 158/78  Pulse: 97  Temp: 98.5 F (36.9 C)  Resp: 19     Physical Exam: Cardiac:  reg Lungs:  CTAB Abdomen:  Soft NT/ND decreased BS; denies flatus Incisions:  Bandage covering incision is c/d/i. Bilateral groin incisions c/d/i. Extremities:  + palpable DP/PT on the right; left foot is warm.  CBC    Component Value Date/Time   WBC 17.7* 01/10/2012 0440   RBC 4.00* 01/10/2012 0440   HGB 12.4* 01/10/2012 0440   HCT 35.7* 01/10/2012 0440   PLT 110* 01/10/2012 0440   MCV 89.3 01/10/2012 0440   MCH 31.0 01/10/2012 0440   MCHC 34.7 01/10/2012 0440   RDW 14.6 01/10/2012 0440   LYMPHSABS 1.9 12/10/2011 2028   MONOABS 0.6 12/10/2011 2028   EOSABS 0.2 12/10/2011 2028   BASOSABS 0.1 12/10/2011 2028    BMET    Component Value Date/Time   NA 134* 01/10/2012 0440   K 3.4* 01/10/2012 0440   CL 102 01/10/2012 0440   CO2 24 01/10/2012 0440   GLUCOSE 117* 01/10/2012 0440   BUN 11 01/10/2012 0440   CREATININE 0.76 01/10/2012 0440   CALCIUM 8.4 01/10/2012 0440   GFRNONAA >90 01/10/2012 0440   GFRAA >90 01/10/2012 0440    INR    Component Value Date/Time   INR 1.19 01/08/2012 1505     Intake/Output Summary (Last 24 hours) at 01/10/12 0751 Last data filed at 01/10/12 0600  Gross per 24 hour  Intake 2109.13 ml  Output   1510 ml  Net 599.13 ml     Assessment/Plan:  66 y.o. male is s/p Aortobifemoral bypass, reimplantation of Inferior mesenteric artery   POD 2  -WBC continues to trend upward slightly-continue to watch.  Will send U/A. -continue to mobilize/IS -Nipride gtt off -acute surgical blood loss anemia-stable -? Dulcolax this am-will d/w Dr. Bynum Bellows, PA-C [facility] [phone] 01/10/2012 7:51 AM  Leukocytosis ? Acute phase vs  UTI, agree with UA Will keep foley until tomorrow since need to monitor urine output with NG losses NG out when +flatus  Fabienne Bruns, MD [facility] [place] Office: [phone] Pager: [phone]

## 2012-01-10 NOTE — Evaluation (Signed)
Physical Therapy Evaluation Patient Details Name: Darryl Ward MRN: [id number] DOB: April 21, 1945 Today's Date: 01/10/2012 Time: 4098-1191 PT Time Calculation (min): 32 min  PT Assessment / Plan / Recommendation Clinical Impression  Pt s/p aortic bifemoral BPG on 01/08/12.  Presents today with decr strength, activity tolerance, balance and confusion. Will benefit from skilled therapy services to address these limitations and maximize his mobility and independence.  Recommend HHPT as long as pt continues to improve physically and cognition improves.  Notified nursing of change in mental cognition since yesterday and Nego, nurse stated that patient is receiving a lot of pain medication.  Recommend 24 hour assistance at d/c, especially if cognition continues to limit independence and safety awareness.     PT Assessment  Patient needs continued PT services    Follow Up Recommendations  Home health PT;Supervision/Assistance - 24 hour          Equipment Recommendations  Rolling walker with 5" wheels       Frequency Min 3X/week    Precautions / Restrictions Precautions Precautions: Fall Restrictions Weight Bearing Restrictions: No   Pertinent Vitals/Pain VSS Pain 10/10; RN gave meds mid session.       Mobility  Bed Mobility Bed Mobility: Rolling Right;Right Sidelying to Sit;Sitting - Scoot to Edge of Bed Rolling Right: 2: Max assist Right Sidelying to Sit: 1: +2 Total assist;HOB elevated;With rails Right Sidelying to Sit: Patient Percentage: 50% Sitting - Scoot to Edge of Bed: 2: Max assist Details for Bed Mobility Assistance: cues for sequencing; incr pain upon sitting expressed through facial expressions Transfers Transfers: Sit to Stand;Stand to Sit Sit to Stand: 3: Mod assist;With upper extremity assist;From bed Stand to Sit: 2: Max assist;With upper extremity assist;To chair/3-in-1 Details for Transfer Assistance: req cues for safety, hand placement, and Assist for  balance; able to stand with one hand push off bed Ambulation/Gait Ambulation/Gait Assistance: 2: Max assist Ambulation Distance (Feet): 25 Feet Assistive device: Rolling walker Ambulation/Gait Assistance Details: max cues for safety, RW use, posture.  Pt with significant forward lean on RW, tried hand held A but pt became anxious, stating he was uncomfortable and needed to sit down; used RW to walk to chair.  Moved very slowly due to pain. Gait Pattern: Step-to pattern;Decreased step length - right;Decreased step length - left;Decreased stride length;Decreased hip/knee flexion - right;Decreased hip/knee flexion - left;Trunk flexed Gait velocity: decr Stairs: No Wheelchair Mobility Wheelchair Mobility: No           PT Diagnosis: Acute pain  PT Problem List: Decreased strength;Decreased range of motion;Decreased activity tolerance;Decreased balance;Decreased mobility;Decreased coordination;Decreased cognition;Decreased knowledge of use of DME;Decreased safety awareness;Decreased knowledge of precautions;Pain PT Treatment Interventions: DME instruction;Gait training;Stair training;Functional mobility training;Therapeutic activities;Therapeutic exercise;Balance training;Neuromuscular re-education;Cognitive remediation   PT Goals Acute Rehab PT Goals PT Goal Formulation: With patient Time For Goal Achievement: 01/24/12 Potential to Achieve Goals: Good Pt will go Supine/Side to Sit: with modified independence;with HOB 0 degrees PT Goal: Supine/Side to Sit - Progress: Goal set today Pt will Sit at Edge of Bed: Independently PT Goal: Sit at Edge Of Bed - Progress: Goal set today Pt will go Sit to Supine/Side: with modified independence;with HOB 0 degrees PT Goal: Sit to Supine/Side - Progress: Goal set today Pt will go Sit to Stand: Independently PT Goal: Sit to Stand - Progress: Goal set today Pt will go Stand to Sit: Independently PT Goal: Stand to Sit - Progress: Goal set today Pt will  Transfer Bed to Chair/Chair to Bed:  Independently PT Transfer Goal: Bed to Chair/Chair to Bed - Progress: Goal set today Pt will Stand: Independently PT Goal: Stand - Progress: Goal set today Pt will Ambulate: >150 feet;with modified independence;with least restrictive assistive device PT Goal: Ambulate - Progress: Goal set today Pt will Go Up / Down Stairs: 3-5 stairs;with modified independence PT Goal: Up/Down Stairs - Progress: Goal set today  Visit Information  Last PT Received On: 01/10/12 Assistance Needed: +2 (unsteady and has IV pole) PT/OT Co-Evaluation/Treatment: Yes    Subjective Data  Subjective: Yea i'm a 10 (when asked about pain) Patient Stated Goal: To get better first, then return home   Prior Functioning  Home Living Lives With: Friend(s) Available Help at Discharge: Available PRN/intermittently (friend and sister PRN) Type of Home: House Home Access: Stairs to enter Secretary/administrator of Steps: 3 Entrance Stairs-Rails: Can reach both Home Layout: One level Bathroom Shower/Tub: Engineer, manufacturing systems: Standard Bathroom Accessibility: Yes How Accessible: Accessible via walker Home Adaptive Equipment: None Prior Function Level of Independence: Independent Able to Take Stairs?: Yes Driving: Yes Vocation: Full time employment Communication Communication: No difficulties Dominant Hand: Right    Cognition  Overall Cognitive Status: Impaired Area of Impairment: Attention;Memory;Following commands Arousal/Alertness: Awake/alert Orientation Level: Other (comment) (difficult to determine if patient was confused on today's date or if he was joking around; later in session pt had difficulty responding to questions and then was unable to read? Or determine? Who sent him flowers when given the card to read) Behavior During Session: [facility] for tasks performed Current Attention Level: Sustained Attention - Other Comments: intermittent levels of attention  without obvious distraction Memory Deficits: did not remember meeting me yesterday morning when I gathered history information  Following Commands: Follows one step commands consistently    Extremity/Trunk Assessment Right Upper Extremity Assessment RUE ROM/Strength/Tone: [facility] for tasks assessed Left Upper Extremity Assessment LUE ROM/Strength/Tone: WFL for tasks assessed Right Lower Extremity Assessment RLE ROM/Strength/Tone: WFL for tasks assessed RLE Sensation: WFL - Light Touch Left Lower Extremity Assessment LLE ROM/Strength/Tone: WFL for tasks assessed LLE Sensation: WFL - Light Touch Trunk Assessment Trunk Assessment: Normal   Balance Balance Balance Assessed: Yes Static Sitting Balance Static Sitting - Balance Support: Bilateral upper extremity supported;Feet supported Static Sitting - Level of Assistance: 4: Min assist Static Sitting - Comment/# of Minutes: EOB ; mod A initially, min A 90% of time.  End of Session PT - End of Session Equipment Utilized During Treatment: Gait belt Activity Tolerance: Patient limited by fatigue;Patient limited by pain Patient left: in chair;with call bell/phone within reach Nurse Communication: Mobility status;Other (comment) (altered cognitive status)       Sharion Balloon 01/10/2012, 1:57 PM Sharion Balloon, SPT Acute Rehab Services [phone]   [facility] Acute Rehabilitation [phone] (pager)

## 2012-01-10 NOTE — Progress Notes (Signed)
INITIAL NUTRITION ASSESSMENT  DOCUMENTATION CODES Per approved criteria  -Not Applicable   INTERVENTION:  Advance diet as medically appropriate RD to follow for nutrition care plan, add interventions accordingly  NUTRITION DIAGNOSIS: Inadequate oral intake related to inability to eat as evidenced by NPO status  Goal: Oral intake vs nutrition support to meet >/= 90% of estimated nutrition needs  Monitor:  PO diet advancement, nutrition support initiation, weight, labs, I/O's  Reason for Assessment: Malnutrition Screening Tool Report  66 y.o. male  Admitting Dx: Abdominal Aortic Aneurysm  ASSESSMENT:  Patient s/p aortobifemoral bypass, reimplantation of inferior mesenteric artery 12/18; reports he was eating well PTA; also reports at 20 lb weight loss x 1 month, however, records show weight stability; NGT to LIS; decreased bowel sounds; no BM yet; plan is for continued bowel rest at this time.  Height: Ht Readings from Last 1 Encounters:  01/08/12 5\' 10"  (1.778 m)    Weight: Wt Readings from Last 1 Encounters:  01/09/12 178 lb 9.6 oz (81.012 kg)    Ideal Body Weight: 75.4 kg  % Ideal Body Weight: 108%  Wt Readings from Last 10 Encounters:  01/09/12 178 lb 9.6 oz (81.012 kg)  01/09/12 178 lb 9.6 oz (81.012 kg)  01/03/12 168 lb 6.4 oz (76.386 kg)  12/26/11 172 lb (78.019 kg)  12/13/11 169 lb 1.9 oz (76.712 kg)  12/12/11 168 lb 8 oz (76.431 kg)  12/10/11 174 lb (78.926 kg)    Usual Body Weight: 168--174 lb  % Usual Body Weight: 102--106%  BMI:  Body mass index is 25.63 kg/(m^2).  Estimated Nutritional Needs: Kcal: 2200-2400 Protein: 115-125 gm Fluid: 2.2-2.4 L  Skin: abdominal & groin surgical incisions   Diet Order: NPO  EDUCATION NEEDS: -No education needs identified at this time   Intake/Output Summary (Last 24 hours) at 01/10/12 1150 Last data filed at 01/10/12 0600  Gross per 24 hour  Intake   1800 ml  Output   1285 ml  Net    515 ml     Labs:   Lab 01/10/12 0440 01/09/12 0350 01/08/12 1505  NA 134* 137 137  K 3.4* 3.6 3.5  CL 102 104 104  CO2 24 25 23   BUN 11 12 10   CREATININE 0.76 0.81 0.71  CALCIUM 8.4 8.1* 8.1*  MG -- 1.5 1.6  PHOS -- -- --  GLUCOSE 117* 138* 152*    Scheduled Meds:   . docusate sodium  100 mg Oral Daily  . metoprolol  2.5 mg Intravenous Q6H  . pantoprazole  40 mg Oral Daily  . pantoprazole (PROTONIX) IV  40 mg Intravenous Q24H    Continuous Infusions:   . dextrose 5 % and 0.45 % NaCl with KCl 20 mEq/L 100 mL/hr at 01/10/12 1610    Past Medical History  Diagnosis Date  . Stroke     2004  . Coronary atherosclerosis of native coronary artery     PTCA small diagonal 2007 otherwise nonobstructive CAD, EF 50-55%  . AAA (abdominal aortic aneurysm)   . Carotid artery disease     Nonobstructive  . Essential hypertension, benign     takes Metoprolol daily  . Hyperlipidemia     takes Zocor nightly  . NSTEMI (non-ST elevated myocardial infarction)     2007  . TIA (transient ischemic attack) 2006  . Abdominal aneurysm   . Cataract     Past Surgical History  Procedure Date  . Cardiac catheterization 2007  . Coronary angioplasty  1 stent  . Left cataract surgery   . Aorta - bilateral femoral artery bypass graft 01/08/2012    Procedure: AORTA BIFEMORAL BYPASS GRAFT;  Surgeon: Sherren Kerns, MD;  Location: [facility] OR;  Service: Vascular;  Laterality: Bilateral;  using 18x71mm x 40cm Hemashield Gold Vascular Graft with Endarterectomy, Thombectomy and  Reimplantation of Inferior Mesenteric Artery    Kirkland Hun, RD, LDN Pager #: [phone] After-Hours Pager #: [phone]

## 2012-01-10 NOTE — Evaluation (Signed)
Occupational Therapy Evaluation Patient Details Name: Darryl Ward MRN: [id number] DOB: July 17, 1945 Today's Date: 01/10/2012 Time: 4098-1191 OT Time Calculation (min): 32 min  OT Assessment / Plan / Recommendation Clinical Impression    Pt s/p aortic bifemoral BPG on 01/08/12 .  Pt presents to OT with the below listed deficits and will benefit from OT to maximize safety and independence with BADLs to allow pt. to return home with supervision from friends    OT Assessment  Patient needs continued OT Services    Follow Up Recommendations  Supervision/Assistance - 24 hour    Barriers to Discharge Decreased caregiver support unable to provide 24 hour supervision if needed  Equipment Recommendations  3 in 1 bedside comode    Recommendations for Other Services    Frequency  Min 2X/week    Precautions / Restrictions Precautions Precautions: Fall Restrictions Weight Bearing Restrictions: No       ADL  Eating/Feeding: NPO Grooming: Wash/dry hands;Teeth care;Wash/dry face;Supervision/safety Where Assessed - Grooming: Supported sitting Upper Body Bathing: Minimal assistance Where Assessed - Upper Body Bathing: Supported sitting Lower Body Bathing: Maximal assistance Where Assessed - Lower Body Bathing: Supported sit to stand Upper Body Dressing: Moderate assistance Where Assessed - Upper Body Dressing: Unsupported sitting Lower Body Dressing: +1 Total assistance Where Assessed - Lower Body Dressing: Supported sit to Pharmacist, hospital: Moderate assistance Toilet Transfer Method: Stand pivot;Sit to Barista: Bedside commode Toileting - Clothing Manipulation and Hygiene: Maximal assistance Where Assessed - Engineer, mining and Hygiene: Standing Equipment Used: Rolling walker;Gait belt Transfers/Ambulation Related to ADLs: Pt. ambulates with mod A with RW ADL Comments: Pt. requires max - total A with BADLs due to pain    OT Diagnosis:  Generalized weakness;Cognitive deficits;Acute pain  OT Problem List: Decreased strength;Decreased activity tolerance;Impaired balance (sitting and/or standing);Decreased cognition;Decreased safety awareness;Decreased knowledge of use of DME or AE;Pain OT Treatment Interventions: Self-care/ADL training;DME and/or AE instruction;Therapeutic activities;Patient/family education;Balance training   OT Goals Acute Rehab OT Goals OT Goal Formulation: With patient Time For Goal Achievement: 01/24/12 Potential to Achieve Goals: Good ADL Goals Pt Will Perform Grooming: with supervision;Standing at sink ADL Goal: Grooming - Progress: Goal set today Pt Will Perform Upper Body Bathing: with set-up;Sitting, chair ADL Goal: Upper Body Bathing - Progress: Goal set today Pt Will Perform Lower Body Bathing: with supervision;Sit to stand from chair;Sit to stand from bed ADL Goal: Lower Body Bathing - Progress: Goal set today Pt Will Perform Upper Body Dressing: with set-up;Sitting, chair;Sitting, bed ADL Goal: Upper Body Dressing - Progress: Goal set today Pt Will Perform Lower Body Dressing: with supervision;Sit to stand from chair;Sit to stand from bed ADL Goal: Lower Body Dressing - Progress: Goal set today Pt Will Transfer to Toilet: with supervision;Ambulation;Comfort height toilet ADL Goal: Toilet Transfer - Progress: Goal set today Pt Will Perform Toileting - Clothing Manipulation: with supervision;Standing ADL Goal: Toileting - Clothing Manipulation - Progress: Goal set today Pt Will Perform Tub/Shower Transfer: with supervision;Ambulation;with DME ADL Goal: Tub/Shower Transfer - Progress: Goal set today Additional ADL Goal #1: Pt. will participate in 25 mins therapeutic activity to increase endurance for ADLs ADL Goal: Additional Goal #1 - Progress: Goal set today  Visit Information  Last OT Received On: 01/10/12 Assistance Needed: +1 PT/OT Co-Evaluation/Treatment: Yes    Subjective Data   Subjective: "I don't remember you at all"  re: PT Patient Stated Goal: To reduce pain   Prior Functioning     Home Living Lives With: Friend(s)  Available Help at Discharge: Available PRN/intermittently (friend and sister PRN) Type of Home: House Home Access: Stairs to enter [employer] of Steps: 3 Entrance Stairs-Rails: Can reach both Home Layout: One level Bathroom Shower/Tub: Engineer, manufacturing systems: Standard Bathroom Accessibility: Yes How Accessible: Accessible via walker Home Adaptive Equipment: None Prior Function Level of Independence: Independent Able to Take Stairs?: Yes Driving: Yes 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 distracted by pain Memory Deficits: Pt. unable to recall therapist from yesterday Following Commands: Follows one step commands consistently Cognition - Other Comments: Pt. winked at therapist when asked the date, as if he were joking and reported the wrong date.  When asked him to be serious, he was unable to provide correct date with use of calendar.  Pt. also unable to determine who sent him flowers - looked several times at the card, opening and closing it before asking therapist to locate the information     Extremity/Trunk Assessment Right Upper Extremity Assessment RUE ROM/Strength/Tone: [facility] for tasks assessed RUE Sensation: WFL - Light Touch RUE Coordination: WFL - gross/fine motor Left Upper Extremity Assessment LUE ROM/Strength/Tone: WFL for tasks assessed LUE Sensation: WFL - Light Touch LUE Coordination: WFL -  gross/fine motor Right Lower Extremity Assessment RLE ROM/Strength/Tone: WFL for tasks assessed RLE Sensation: WFL - Light Touch Left Lower Extremity Assessment LLE ROM/Strength/Tone: WFL for tasks assessed LLE Sensation: WFL - Light Touch Trunk Assessment Trunk Assessment: Normal     Mobility Bed Mobility Bed Mobility: Rolling Right;Right Sidelying to Sit;Sitting - Scoot to Edge of Bed Rolling Right: 2: Max assist Right Sidelying to Sit: 1: +2 Total assist;HOB elevated;With rails Right Sidelying to Sit: Patient Percentage: 50% Sitting - Scoot to Edge of Bed: 2: Max assist Details for Bed Mobility Assistance: cues for sequencing; incr pain upon sitting expressed through facial expressions Transfers Transfers: Sit to Stand;Stand to Sit Sit to Stand: 3: Mod assist;With upper extremity assist;From bed Stand to Sit: 2: Max assist;With upper extremity assist;To chair/3-in-1 Details for Transfer Assistance: req cues for safety, hand placement, and Assist for balance; able to stand with one hand push off bed     Shoulder Instructions     Exercise     Balance Balance Balance Assessed: Yes Static Sitting Balance Static Sitting - Balance Support: Bilateral upper extremity supported;Feet supported Static Sitting - Level of Assistance: 4: Min assist Static Sitting - Comment/# of Minutes: EOB ; mod A initially, min A 90% of time.   End of Session OT - End of Session Equipment Utilized During Treatment: Gait belt Activity Tolerance: Patient limited by pain Patient left: in chair;with call bell/phone within reach;with nursing in room Nurse Communication: Mobility status;Patient requests pain meds  GO     Jermari Tamargo, Ursula Alert M 01/10/2012, 3:12 PM

## 2012-01-10 NOTE — Progress Notes (Signed)
[facility] [place]  Post-op  Intra-abdominal Surgery note  Date of Surgery: 01/08/2012  Surgeon(s): Sherren Kerns, MD  2 Days Post-Op Procedure(s): AORTA BIFEMORAL BYPASS GRAFT  History of Present Illness  Darryl Ward is a 66 y.o. male who is  up s/p Procedure(s): AORTA BIFEMORAL BYPASS GRAFT Pt is doing well. complains of incisional pain; denies nausea/vomiting; denies diarrhea. has not had flatus;has not had BM  IMAGING: Dg Chest Portable 1 View  01/09/2012  *RADIOLOGY REPORT*  Clinical Data: Postop from vascular surgery  PORTABLE CHEST - 1 VIEW  Comparison: 01/08/2012  Findings: There are reticular opacities at the lung bases most likely atelectasis.  Bronchovascular prominence bilaterally is stable without overt pulmonary edema.  No pneumothorax.  The right internal jugular Swan-Ganz catheter has its tip in the right pulmonary artery.  The nasogastric tube passes below the diaphragm into the stomach.  IMPRESSION: Mild bibasilar atelectasis.  No overt pulmonary edema.  Support apparatus is stable well-positioned.   Original Report Authenticated By: Amie Portland, M.D.    Dg Chest Portable 1 View  01/08/2012  *RADIOLOGY REPORT*  Clinical Data: Swan-Ganz catheter placement at bedside.  PORTABLE CHEST - 1 VIEW 01/08/2012 1458 hours:  Comparison: Two-view chest x-ray 12/10/2011.  Findings: Swan-Ganz catheter tip overlies the distal right main pulmonary artery.  No evidence of pneumothorax or mediastinal hematoma.  Nasogastric tube courses below the diaphragm into the stomach.  Cardiac silhouette normal in size, unchanged allowing for differences in technique.  Mild pulmonary venous hypertension without overt edema.  Moderate central peribronchial thickening, unchanged.  No new pulmonary parenchymal abnormalities.  IMPRESSION:  1.  Swan-Ganz catheter tip overlies the distal right main pulmonary artery.  No acute complicating features. 2.  Nasogastric tube courses  below the diaphragm into the stomach. 3.  Mild pulmonary venous hypertension without overt edema.  Stable chronic bronchitis and/or asthma.  No acute cardiopulmonary disease.   Original Report Authenticated By: Hulan Saas, M.D.    Dg Abd Portable 1v  01/08/2012  *RADIOLOGY REPORT*  Clinical Data: Postop AAA stent  PORTABLE ABDOMEN - 1 VIEW  Comparison: CT 12/10/2011  Findings: Midline abdominal staples.  Normal bowel gas pattern.  NG tube in the stomach.  No retained instrument.  Renal artery calcification bilaterally.  IMPRESSION: No retained instrument following abdominal surgery for aortic aneurysm.   Original Report Authenticated By: Janeece Riggers, M.D.     Significant Diagnostic Studies: CBC Lab Results  Component Value Date   WBC 17.7* 01/10/2012   HGB 12.4* 01/10/2012   HCT 35.7* 01/10/2012   MCV 89.3 01/10/2012   PLT 110* 01/10/2012    BMET    Component Value Date/Time   NA 134* 01/10/2012 0440   K 3.4* 01/10/2012 0440   CL 102 01/10/2012 0440   CO2 24 01/10/2012 0440   GLUCOSE 117* 01/10/2012 0440   BUN 11 01/10/2012 0440   CREATININE 0.76 01/10/2012 0440   CALCIUM 8.4 01/10/2012 0440   GFRNONAA >90 01/10/2012 0440   GFRAA >90 01/10/2012 0440    COAG Lab Results  Component Value Date   INR 1.19 01/08/2012   INR 1.09 01/08/2012   No results found for this basename: PTT    I/O last 3 completed shifts: In: 3670 [I.V.:3210; NG/GT:210; IV Piggyback:250] Out: 2235 [Urine:1865; Emesis/NG output:370]    Physical Examination BP Readings from Last 3 Encounters:  01/10/12 158/78  01/10/12 158/78  01/03/12 147/81   Temp Readings from Last 3 Encounters:  01/10/12 98.5  F (36.9 C) Oral  01/10/12 98.5 F (36.9 C) Oral  01/03/12 97.5 F (36.4 C)    SpO2 Readings from Last 3 Encounters:  01/10/12 94%  01/10/12 94%  01/03/12 98%   Pulse Readings from Last 3 Encounters:  01/10/12 97  01/10/12 97  01/03/12 74    General: A&O x 3, WDWN male in  NAD Pulmonary: normal non-labored breathing , without Rales, rhonchi,  wheezing Cardiac: Heart rate : regular ,  Abdomen:abdomen soft Abdominal wound:clean, dry, intact  Neurologic: A&O X 3; Appropriate Affect ; SENSATION: normal; MOTOR FUNCTION:  moving all extremities equally. Speech is fluent/normal  Vascular Exam:BLE warm and well perfused Extremities without ischemic changes, no Gangrene, no cellulitis; no open wounds;        Assessment/Plan: Darryl Ward is a 66 y.o. male who is 2 Days Post-Op Procedure(s): AORTA BIFEMORAL BYPASS GRAFT NG output 200 cc in past 12 hours, neg bowel sounds Continue bowel rest BP stable       Mosetta Pigeon [phone] 01/10/2012 8:43 AM

## 2012-01-10 NOTE — Care Management Note (Signed)
    Page 1 of 1   01/10/2012     3:56:29 PM   CARE MANAGEMENT NOTE 01/10/2012  Patient:  Darryl Ward, Darryl Ward   Account Number:  [id number]  Date Initiated:  01/10/2012  Documentation initiated by:  [facility]  Subjective/Objective Assessment:   post op aorta bifem.  lives at home with family     Action/Plan:   Anticipated DC Date:  01/13/2012   Anticipated DC Plan:  HOME W HOME HEALTH SERVICES      DC Planning Services  CM consult      Choice offered to / List presented to:             Status of service:  In process, will continue to follow Medicare Important Message given?   (If response is "NO", the following Medicare IM given date fields will be blank) Date Medicare IM given:   Date Additional Medicare IM given:    Discharge Disposition:    Per UR Regulation:  Reviewed for med. necessity/level of care/duration of stay  If discussed at Long Length of Stay Meetings, dates discussed:    Comments:  01-10-12 3:54pm Avie Arenas, RNBSN [phone] Spoke with pateint - Still has NG tube, states feels miserable.  States he does live with family and they will be with him on discharge.  PT/OT recommending HH on discharge - will need orders to intitiate.

## 2012-01-11 MED ORDER — CIPROFLOXACIN IN D5W 400 MG/200ML IV SOLN
400.0000 mg | Freq: Two times a day (BID) | INTRAVENOUS | Status: DC
  Administered 2012-01-11 – 2012-01-12 (×4): 400 mg via INTRAVENOUS
  Filled 2012-01-11 (×6): qty 200

## 2012-01-11 MED ORDER — ENOXAPARIN SODIUM 40 MG/0.4ML ~~LOC~~ SOLN
40.0000 mg | Freq: Every day | SUBCUTANEOUS | Status: DC
  Administered 2012-01-11 – 2012-01-12 (×2): 40 mg via SUBCUTANEOUS
  Filled 2012-01-11 (×3): qty 0.4

## 2012-01-11 MED ORDER — BISACODYL 10 MG RE SUPP
10.0000 mg | Freq: Once | RECTAL | Status: AC
  Administered 2012-01-11: 10 mg via RECTAL
  Filled 2012-01-11: qty 1

## 2012-01-11 NOTE — Progress Notes (Addendum)
[facility] Progress Note  01/11/2012 7:53 AM POD 3  Subjective:  Still c/o pain  Afebrile  VSS 95%RA  Filed Vitals:   01/11/12 0615  BP: 145/85  Pulse: 86  Temp: 98.5 F (36.9 C)  Resp: 18     Physical Exam: Cardiac:  reg Lungs:  CTAB but decreased throughout Abdomen:  Soft NT/ND +BS but denies flatus Incisions:  Laparotomy incision c/d/i with staples in tact.  Bilateral groin incisions are c/d/i Extremities:  Right foot with palpable DP and warm and well perfused; left foot warm and well perfused. No edema BLE  CBC    Component Value Date/Time   WBC 17.7* 01/10/2012 0440   RBC 4.00* 01/10/2012 0440   HGB 12.4* 01/10/2012 0440   HCT 35.7* 01/10/2012 0440   PLT 110* 01/10/2012 0440   MCV 89.3 01/10/2012 0440   MCH 31.0 01/10/2012 0440   MCHC 34.7 01/10/2012 0440   RDW 14.6 01/10/2012 0440   LYMPHSABS 1.9 12/10/2011 2028   MONOABS 0.6 12/10/2011 2028   EOSABS 0.2 12/10/2011 2028   BASOSABS 0.1 12/10/2011 2028    BMET    Component Value Date/Time   NA 134* 01/10/2012 0440   K 3.4* 01/10/2012 0440   CL 102 01/10/2012 0440   CO2 24 01/10/2012 0440   GLUCOSE 117* 01/10/2012 0440   BUN 11 01/10/2012 0440   CREATININE 0.76 01/10/2012 0440   CALCIUM 8.4 01/10/2012 0440   GFRNONAA >90 01/10/2012 0440   GFRAA >90 01/10/2012 0440    INR    Component Value Date/Time   INR 1.19 01/08/2012 1505     Intake/Output Summary (Last 24 hours) at 01/11/12 0753 Last data filed at 01/11/12 0600  Gross per 24 hour  Intake   2420 ml  Output   1900 ml  Net    520 ml   NGT Output:  200cc/24 hr  Assessment/Plan:  66 y.o. male is s/p Aortobifemoral bypass, reimplantation of Inferior mesenteric artery   POD 3  -200 cc out of NGT, but RN states there has been no output past 12 hrs. -dulcolax supp this am. -continue to mobilize/IS -ck labs in am - Discontinue foley today - Start prophylactic Lovenox -clamp trials on NGT today Cipro for  UTI   [name], PA-C [facility] [phone] 01/11/2012 7:53 AM  Addendum  I have independently interviewed and examined the patient, and I agree with the physician assistant's findings.  Appropriate post-op course.  Slow bowel recovery.  NGT clamp trial today.  Start sips of water if NGT d/c.  Leonides Sake, MD [facility] [place] Office: [phone] Pager: [phone]  01/11/2012, 9:30 AM  Addendum  Pt had BM.  NGT removed.  Sips of clear.  Leonides Sake, MD [facility] [place] Office: [phone] Pager: [phone]  01/11/2012, 3:45 PM

## 2012-01-12 LAB — CBC
HCT: 33 % — ABNORMAL LOW (ref 39.0–52.0)
Hemoglobin: 11.4 g/dL — ABNORMAL LOW (ref 13.0–17.0)
MCH: 30.9 pg (ref 26.0–34.0)
MCHC: 34.5 g/dL (ref 30.0–36.0)
MCV: 89.4 fL (ref 78.0–100.0)
Platelets: 130 10*3/uL — ABNORMAL LOW (ref 150–400)
RBC: 3.69 MIL/uL — ABNORMAL LOW (ref 4.22–5.81)
RDW: 14.4 % (ref 11.5–15.5)
WBC: 9.6 10*3/uL (ref 4.0–10.5)

## 2012-01-12 LAB — BASIC METABOLIC PANEL
BUN: 11 mg/dL (ref 6–23)
CO2: 22 mEq/L (ref 19–32)
Calcium: 8.9 mg/dL (ref 8.4–10.5)
Chloride: 104 mEq/L (ref 96–112)
Creatinine, Ser: 0.73 mg/dL (ref 0.50–1.35)
GFR calc Af Amer: >90 mL/min (ref >90)
GFR calc non Af Amer: >90 mL/min (ref >90)
Glucose, Bld: 101 mg/dL — ABNORMAL HIGH (ref 70–99)
Potassium: 3.3 mEq/L — ABNORMAL LOW (ref 3.5–5.1)
Sodium: 137 mEq/L (ref 135–145)

## 2012-01-12 NOTE — Progress Notes (Addendum)
[facility] Progress Note  01/12/2012 8:00 AM POD 4  Subjective:  Feeling better this am  Tm 99.6 now afebrile VSS 100%RA  Filed Vitals:   01/12/12 0413  BP: 149/79  Pulse: 84  Temp: 98.6 F (37 C)  Resp: 18     Physical Exam: Cardiac:  reg Lungs:  CTAB Abdomen:  Soft NT/ND +BM/+BS Incisions:  C/d/i Extremities:  Warm and well perfused. Toes still dusky, but improving  CBC    Component Value Date/Time   WBC 9.6 01/12/2012 0543   RBC 3.69* 01/12/2012 0543   HGB 11.4* 01/12/2012 0543   HCT 33.0* 01/12/2012 0543   PLT 130* 01/12/2012 0543   MCV 89.4 01/12/2012 0543   MCH 30.9 01/12/2012 0543   MCHC 34.5 01/12/2012 0543   RDW 14.4 01/12/2012 0543   LYMPHSABS 1.9 12/10/2011 2028   MONOABS 0.6 12/10/2011 2028   EOSABS 0.2 12/10/2011 2028   BASOSABS 0.1 12/10/2011 2028    BMET    Component Value Date/Time   NA 137 01/12/2012 0543   K 3.3* 01/12/2012 0543   CL 104 01/12/2012 0543   CO2 22 01/12/2012 0543   GLUCOSE 101* 01/12/2012 0543   BUN 11 01/12/2012 0543   CREATININE 0.73 01/12/2012 0543   CALCIUM 8.9 01/12/2012 0543   GFRNONAA >90 01/12/2012 0543   GFRAA >90 01/12/2012 0543    INR    Component Value Date/Time   INR 1.19 01/08/2012 1505     Intake/Output Summary (Last 24 hours) at 01/12/12 0800 Last data filed at 01/12/12 0606  Gross per 24 hour  Intake      0 ml  Output   2300 ml  Net  -2300 ml     Assessment/Plan:  66 y.o. male is s/p Aortobifemoral bypass, reimplantation of Inferior mesenteric artery   POD 4  -doing well with NGT out.  Will advance diet -if tolerates, may discharge home tomorrow. -continue mobilization   [name], PA-C [facility] [phone] 01/12/2012 8:00 AM  Addendum  I have independently interviewed and examined the patient, and I agree with the physician assistant's findings.  Tolerated clears last night.  Advanced diet as tolerated.  Home soon if patient  tolerates diet. PT/OT needs noted.  Will need to arrange.  Leonides Sake, MD [facility] [place] Office: [phone] Pager: [phone]  01/12/2012, 8:43 AM

## 2012-01-13 MED ORDER — OXYCODONE-ACETAMINOPHEN 5-325 MG PO TABS: 1.0000 | ORAL_TABLET | ORAL | Status: DC | PRN

## 2012-01-13 MED ORDER — CIPROFLOXACIN HCL 500 MG PO TABS: 500.0000 mg | ORAL_TABLET | Freq: Two times a day (BID) | ORAL | Status: DC

## 2012-01-13 NOTE — Progress Notes (Signed)
Physical Therapy Treatment Patient Details Name: Darryl Ward MRN: [id number] DOB: 1945/02/20 Today's Date: 01/13/2012 Time: 6962-9528 PT Time Calculation (min): 25 min  PT Assessment / Plan / Recommendation Comments on Treatment Session  Pt s/p aortobifemoral bypass graft.  Pt is doing much better with ambulation with continued need for occasional cuing for safety with RW.  Pt states that he will have 24 hour care and will do well at home.  Obtaining a RW per patient.      Follow Up Recommendations  Home health PT;Supervision/Assistance - 24 hour                 Equipment Recommendations  Rolling walker with 5" wheels    Recommendations for Other Services  None  Frequency Min 3X/week   Plan Discharge plan remains appropriate;Frequency remains appropriate    Precautions / Restrictions Precautions Precautions: Fall Restrictions Weight Bearing Restrictions: No   Pertinent Vitals/Pain VSS, Some pain    Mobility  Bed Mobility Bed Mobility: Not assessed Transfers Transfers: Sit to Stand;Stand to Sit Sit to Stand: 4: Min guard;With upper extremity assist;With armrests;From chair/3-in-1 Stand to Sit: 4: Min guard;With upper extremity assist;With armrests;To chair/3-in-1 Details for Transfer Assistance: cues for hand placement and steadying assist.  Ambulation/Gait Ambulation/Gait Assistance: 4: Min guard Ambulation Distance (Feet): 450 Feet Assistive device: Rolling walker Ambulation/Gait Assistance Details: Ambulated to bathroom first and then ambulated in hallway.  Cleaned himself independently.  Continues to need occasional cues for safety and use of RW to stay close to RW.  Pt has progressed distance quite a bit.  States he will have someone with him at all times on d/c from hospital. Gait Pattern: Step-to pattern;Decreased step length - right;Decreased step length - left;Decreased stride length;Trunk flexed Gait velocity: decreased Stairs: No Wheelchair  Mobility Wheelchair Mobility: No    Exercises General Exercises - Lower Extremity Ankle Circles/Pumps: AROM;10 reps;Seated;Both Long Arc Quad: AROM;Both;10 reps;Seated Heel Slides: AROM;Both;10 reps;Seated Hip Flexion/Marching: AROM;Both;10 reps;Seated   PT Goals Acute Rehab PT Goals PT Goal: Sit to Stand - Progress: Progressing toward goal PT Goal: Stand to Sit - Progress: Progressing toward goal PT Transfer Goal: Bed to Chair/Chair to Bed - Progress: Progressing toward goal PT Goal: Stand - Progress: Progressing toward goal PT Goal: Ambulate - Progress: Progressing toward goal PT Goal: Up/Down Stairs - Progress:  (declined to practice)  Visit Information  Last PT Received On: 01/13/12 Assistance Needed: +1    Subjective Data  Subjective: "I need to go to the bathroom." Patient Stated Goal: To get better   Cognition  Overall Cognitive Status: Appears within functional limits for tasks assessed/performed Area of Impairment: Attention Arousal/Alertness: Awake/alert Orientation Level: Appears intact for tasks assessed Behavior During Session: [facility] for tasks performed Current Attention Level: Sustained    Balance  Static Sitting Balance Static Sitting - Level of Assistance: Not tested (comment) Static Standing Balance Static Standing - Balance Support: Bilateral upper extremity supported;During functional activity Static Standing - Level of Assistance: 5: Stand by assistance Static Standing - Comment/# of Minutes: 2 minutes washing hands at sink  End of Session PT - End of Session Equipment Utilized During Treatment: Gait belt Patient left: in chair;with call bell/phone within reach Nurse Communication: Mobility status        INGOLD,Deshawnda Acrey 01/13/2012, 11:54 AM  [facility] [phone] (pager)

## 2012-01-13 NOTE — Discharge Summary (Signed)
[facility] Discharge Summary   Patient ID:  Darryl Ward MRN: [id number] DOB/AGE: 08-20-1945 66 y.o.  Admit date: 01/08/2012 Discharge date: 01/13/2012 Date of Surgery: 01/08/2012 Surgeon: Surgeon(s): Sherren Kerns, MD  Admission Diagnosis: CLAUDICATION  Discharge Diagnoses:  CLAUDICATION  Secondary Diagnoses: Past Medical History  Diagnosis Date  . Stroke     2004  . Coronary atherosclerosis of native coronary artery     PTCA small diagonal 2007 otherwise nonobstructive CAD, EF 50-55%  . AAA (abdominal aortic aneurysm)   . Carotid artery disease     Nonobstructive  . Essential hypertension, benign     takes Metoprolol daily  . Hyperlipidemia     takes Zocor nightly  . NSTEMI (non-ST elevated myocardial infarction)     2007  . TIA (transient ischemic attack) 2006  . Abdominal aneurysm   . Cataract     Procedure(s): AORTA BIFEMORAL BYPASS GRAFT  Discharged Condition: good  HPI: Patient is a 66 year old male who returns for followup today. He has a known abdominal aortic aneurysm. He also has iliac occlusive disease bilaterally. He primarily complains of left leg pain. He recently saw Dr. Diona Browner for cardiac risk stratification. At that time he was placed on a beta blocker as well as a statin. Unfortunately he continues to smoke but is trying to quit. He is on aspirin. He denies any abdominal or back pain. He has very short distance claudication.     Hospital Course:  Darryl Ward is a 66 y.o. male is S/P  Procedure(s): AORTA BIFEMORAL BYPASS GRAFT Extubated: POD # 0 Post-op wounds clean, dry, intact or healing well Pt. Ambulating, voiding and taking PO diet without difficulty. Pt pain controlled with PO pain meds. Labs as below Complications:UTI 2 days of IV cipro and D/C home with PO  Consults:     Significant Diagnostic Studies: CBC Lab Results  Component Value Date   WBC 9.6 01/12/2012   HGB 11.4* 01/12/2012   HCT  33.0* 01/12/2012   MCV 89.4 01/12/2012   PLT 130* 01/12/2012    BMET    Component Value Date/Time   NA 137 01/12/2012 0543   K 3.3* 01/12/2012 0543   CL 104 01/12/2012 0543   CO2 22 01/12/2012 0543   GLUCOSE 101* 01/12/2012 0543   BUN 11 01/12/2012 0543   CREATININE 0.73 01/12/2012 0543   CALCIUM 8.9 01/12/2012 0543   GFRNONAA >90 01/12/2012 0543   GFRAA >90 01/12/2012 0543   COAG Lab Results  Component Value Date   INR 1.19 01/08/2012   INR 1.09 01/08/2012     Disposition:  Discharge to :Home Discharge Orders    Future Appointments: Provider: Department: Dept Phone: Center:   01/23/2012 9:20 AM Evern Bio, NP [facility] [phone] [facility]   03/16/2012 2:20 PM [name], MD [facility] [phone] [email or web address]     Future Orders Please Complete By Expires   Resume previous diet      Driving Restrictions      Comments:   No driving for 2 weeks   Lifting restrictions      Comments:   No lifting for 6 weeks   Call MD for:  temperature >100.5      Call MD for:  redness, tenderness, or signs of infection (pain, swelling, bleeding, redness, odor or green/yellow discharge around incision site)      Call MD for:  severe or increased pain, loss or decreased feeling  in  affected limb(s)      ABDOMINAL PROCEDURE/ANEURYSM REPAIR/AORTO-BIFEMORAL BYPASS:  Call MD for increased abdominal pain; cramping diarrhea; nausea/vomiting      Discharge wound care:      Comments:   Shower daily with soap and water   Discharge patient      Comments:   Discharge pt to home      Aloys, Hupfer  Home Medication Instructions HQI:[id number]   Printed on:01/13/12 0835  Medication Information                    aspirin 325 MG tablet Take 325 mg by mouth daily.           metoprolol succinate (TOPROL XL) 25 MG 24 hr tablet Take 1 tablet (25 mg total) by mouth daily.           simvastatin (ZOCOR) 20 MG tablet Take 1 tablet (20  mg total) by mouth at bedtime.           oxyCODONE-acetaminophen (PERCOCET/ROXICET) 5-325 MG per tablet Take 1 tablet by mouth every 6 (six) hours as needed for pain. For pain           oxyCODONE-acetaminophen (PERCOCET/ROXICET) 5-325 MG per tablet Take 1 tablet by mouth every 4 (four) hours as needed for pain.           ciprofloxacin (CIPRO) 500 MG tablet Take 1 tablet (500 mg total) by mouth 2 (two) times daily.            Verbal and written Discharge instructions given to the patient. Wound care per Discharge AVS Follow-up Information    Follow up with Fabienne Bruns E, MD. In 2 weeks. (Office will call you to arrange your appt (sent))    Contact information:   [address] [place] [address] [phone]        Cipro for 7 days po 500 mg BID  Signed: Mosetta Pigeon 01/13/2012, 8:35 AM

## 2012-01-13 NOTE — Progress Notes (Signed)
Pt ambulated in hallway 350 feet with rolling walker and standby assist. Pt tolerated well.  Alfonso Ellis, RN

## 2012-01-13 NOTE — Progress Notes (Signed)
Darryl Ward  Progress Note Bypass Surgery  Date of Surgery: 01/08/2012  Procedure(s): AORTA BIFEMORAL BYPASS GRAFT Surgeon: Surgeon(s): Sherren Kerns, MD  5 Days Post-Op  History of Present Illness  [name] is a 67 y.o. male who is S/P Procedure(s): AORTA BIFEMORAL BYPASS GRAFT .  The patient's pre-op symptoms of pain are Improved . Patients pain is well controlled.       Imaging: No results found.  Significant Diagnostic Studies: CBC Lab Results  Component Value Date   WBC 9.6 01/12/2012   HGB 11.4* 01/12/2012   HCT 33.0* 01/12/2012   MCV 89.4 01/12/2012   PLT 130* 01/12/2012    BMET     Component Value Date/Time   NA 137 01/12/2012 0543   K 3.3* 01/12/2012 0543   CL 104 01/12/2012 0543   CO2 22 01/12/2012 0543   GLUCOSE 101* 01/12/2012 0543   BUN 11 01/12/2012 0543   CREATININE 0.73 01/12/2012 0543   CALCIUM 8.9 01/12/2012 0543   GFRNONAA >90 01/12/2012 0543   GFRAA >90 01/12/2012 0543    COAG Lab Results  Component Value Date   INR 1.19 01/08/2012   INR 1.09 01/08/2012   No results found for this basename: PTT    Physical Examination  BP Readings from Last 3 Encounters:  01/13/12 154/81  01/13/12 154/81  01/03/12 147/81   Temp Readings from Last 3 Encounters:  01/13/12 98.8 F (37.1 C) Oral  01/13/12 98.8 F (37.1 C) Oral  01/03/12 97.5 F (36.4 C)    SpO2 Readings from Last 3 Encounters:  01/13/12 94%  01/13/12 94%  01/03/12 98%   Pulse Readings from Last 3 Encounters:  01/13/12 78  01/13/12 78  01/03/12 74    Pt is A&O x 3 Cardiac: reg  Lungs: CTAB  Abdomen: Soft NT/ND +BM/+BS  Incisions: C/d/i  Extremities: Warm and well perfused.    Assessment/Plan: Pt. Doing well Post-op pain is controlled Wounds are clean, dry, intact or healing well   Darryl Ward [phone] 01/13/2012 8:32 AM

## 2012-01-13 NOTE — Progress Notes (Signed)
Assessment unchanged. Discussed D/C instructions with pt including f/u appointments. RX given to pt and order to receive Walker from [facility].  Tele and IV removed. Pt left via W/C to car.

## 2012-01-13 NOTE — Progress Notes (Signed)
Pt ambulated in hallway 250 feet with rolling walker and standby assist. Pt tolerated well.  Alfonso Ellis, RN

## 2012-01-21 ENCOUNTER — Encounter: Payer: Self-pay | Admitting: Neurosurgery

## 2012-01-23 ENCOUNTER — Ambulatory Visit (INDEPENDENT_AMBULATORY_CARE_PROVIDER_SITE_OTHER): Payer: BC Managed Care – PPO | Admitting: Neurosurgery

## 2012-01-23 ENCOUNTER — Encounter: Payer: Self-pay | Admitting: Neurosurgery

## 2012-01-23 VITALS — BP 104/65 | HR 79 | Temp 97.8°F | Resp 16 | Ht 70.0 in | Wt 165.0 lb

## 2012-01-23 DIAGNOSIS — L98499 Non-pressure chronic ulcer of skin of other sites with unspecified severity: Secondary | ICD-10-CM

## 2012-01-23 DIAGNOSIS — Z48812 Encounter for surgical aftercare following surgery on the circulatory system: Secondary | ICD-10-CM

## 2012-01-23 DIAGNOSIS — I739 Peripheral vascular disease, unspecified: Secondary | ICD-10-CM

## 2012-01-23 DIAGNOSIS — I7025 Atherosclerosis of native arteries of other extremities with ulceration: Secondary | ICD-10-CM | POA: Insufficient documentation

## 2012-01-23 MED ORDER — HYDROCODONE-ACETAMINOPHEN 5-500 MG PO TABS: 1.0000 | ORAL_TABLET | Freq: Four times a day (QID) | ORAL | Status: DC | PRN

## 2012-01-23 NOTE — Progress Notes (Signed)
[facility] Office Note  CC: Postop for staple removal status post aortobifem bypass Referring Physician: Fields  History of Present Illness: 67 year old male patient of Dr. Darrick Penna who underwent aortiobifemoral bypass with reimplantation of IMA 01/08/2012. The patient comes in today for wound check and staple removal. Patient has no complaints and states his legs do feel better. The patient is currently out of pain medication but has only minor pain complaints.  Past Medical History  Diagnosis Date  . Stroke     2004  . Coronary atherosclerosis of native coronary artery     PTCA small diagonal 2007 otherwise nonobstructive CAD, EF 50-55%  . AAA (abdominal aortic aneurysm)   . Carotid artery disease     Nonobstructive  . Essential hypertension, benign     takes Metoprolol daily  . Hyperlipidemia     takes Zocor nightly  . NSTEMI (non-ST elevated myocardial infarction)     2007  . TIA (transient ischemic attack) 2006  . Abdominal aneurysm   . Cataract     ROS: [x]  Positive   [ ]  Denies    General: [ ]  Weight loss, [ ]  Fever, [ ]  chills Neurologic: [ ]  Dizziness, [ ]  Blackouts, [ ]  Seizure [ ]  Stroke, [ ]  "Mini stroke", [ ]  Slurred speech, [ ]  Temporary blindness; [ ]  weakness in arms or legs, [ ]  Hoarseness Cardiac: [ ]  Chest pain/pressure, [ ]  Shortness of breath at rest [ ]  Shortness of breath with exertion, [ ]  Atrial fibrillation or irregular heartbeat Vascular: [ ]  Pain in legs with walking, [ ]  Pain in legs at rest, [ ]  Pain in legs at night,  [ ]  Non-healing ulcer, [ ]  Blood clot in vein/DVT,   Pulmonary: [ ]  Home oxygen, [ ]  Productive cough, [ ]  Coughing up blood, [ ]  Asthma,  [ ]  Wheezing Musculoskeletal:  [ ]  Arthritis, [ ]  Low back pain, [ ]  Joint pain Hematologic: [ ]  Easy Bruising, [ ]  Anemia; [ ]  Hepatitis Gastrointestinal: [ ]  Blood in stool, [ ]  Gastroesophageal Reflux/heartburn, [ ]  Trouble swallowing Urinary: [ ]  chronic  Kidney disease, [ ]  on HD - [ ]  MWF or [ ]  TTHS, [ ]  Burning with urination, [ ]  Difficulty urinating Skin: [ ]  Rashes, [ ]  Wounds Psychological: [ ]  Anxiety, [ ]  Depression   Social History History  Substance Use Topics  . Smoking status: Current Some Day Smoker -- 0.5 packs/day for 40 years    Types: Cigarettes  . Smokeless tobacco: Never Used  . Alcohol Use: No     Comment: Prior history of regular alcohol use    Family History Family History  Problem Relation Age of Onset  . Hyperlipidemia Sister   . Heart attack Brother     No Known Allergies  Current Outpatient Prescriptions  Medication Sig Dispense Refill  . aspirin 325 MG tablet Take 325 mg by mouth daily.      . ciprofloxacin (CIPRO) 500 MG tablet Take 1 tablet (500 mg total) by mouth 2 (two) times daily.  14 tablet  0  . HYDROcodone-acetaminophen (VICODIN) 5-500 MG per tablet Take 1 tablet by mouth every 6 (six) hours as needed for pain.  30 tablet  0  . metoprolol succinate (TOPROL XL) 25 MG 24 hr tablet Take 1 tablet (25 mg total) by mouth daily.  30 tablet  3  . oxyCODONE-acetaminophen (PERCOCET/ROXICET) 5-325 MG per tablet Take 1 tablet by mouth every 6 (six)  hours as needed for pain. For pain  30 tablet  0  . oxyCODONE-acetaminophen (PERCOCET/ROXICET) 5-325 MG per tablet Take 1 tablet by mouth every 4 (four) hours as needed for pain.  30 tablet  0  . simvastatin (ZOCOR) 20 MG tablet Take 1 tablet (20 mg total) by mouth at bedtime.  90 tablet  3    Physical Examination  Filed Vitals:   01/23/12 0940  BP: 104/65  Pulse: 79  Temp: 97.8 F (36.6 C)  Resp: 16    Body mass index is 23.68 kg/(m^2).  General:  WDWN in NAD Gait: Normal HEENT: WNL Eyes: Pupils equal Pulmonary: normal non-labored breathing , without Rales, rhonchi,  wheezing Cardiac: RRR, without  Murmurs, rubs or gallops; No carotid bruits Abdomen: soft, NT, no masses Skin: no rashes, ulcers noted Vascular Exam/Pulses: 2+ radial pulses  bilaterally, no carotid bruits are heard, he has palpable femoral pulses bilaterally, lower extremities are well-perfused  Extremities without ischemic changes, no Gangrene , no cellulitis; no open wounds;  Musculoskeletal: no muscle wasting or atrophy  Neurologic: A&O X 3; Appropriate Affect ; SENSATION: normal; MOTOR FUNCTION:  moving all extremities equally. Speech is fluent/normal  Non-Invasive Vascular Imaging: None  ASSESSMENT/PLAN: Status post aortobifemoral bypass and doing well. The wound is well healed the staples are ready to come out, his groin incisions are healed and I did instruct him to remove the Dermabond and the patient states understanding. The patient will followup with Dr. Darrick Penna in 3-4 weeks, his questions were encouraged and answered, he is in agreement with this plan.  Lauree Chandler ANP   Clinic M.D.: [facility]

## 2012-02-19 ENCOUNTER — Encounter: Payer: Self-pay | Admitting: Vascular Surgery

## 2012-02-20 ENCOUNTER — Encounter: Payer: Self-pay | Admitting: Vascular Surgery

## 2012-02-20 ENCOUNTER — Ambulatory Visit (INDEPENDENT_AMBULATORY_CARE_PROVIDER_SITE_OTHER): Payer: BC Managed Care – PPO | Admitting: Vascular Surgery

## 2012-02-20 ENCOUNTER — Other Ambulatory Visit: Payer: Self-pay | Admitting: *Deleted

## 2012-02-20 VITALS — BP 123/80 | HR 77 | Resp 16 | Ht 70.0 in | Wt 162.0 lb

## 2012-02-20 DIAGNOSIS — I739 Peripheral vascular disease, unspecified: Secondary | ICD-10-CM

## 2012-02-20 DIAGNOSIS — Z48812 Encounter for surgical aftercare following surgery on the circulatory system: Secondary | ICD-10-CM

## 2012-02-20 MED ORDER — OXYCODONE-ACETAMINOPHEN 5-325 MG PO TABS: ORAL_TABLET | ORAL | Status: DC

## 2012-02-20 NOTE — Progress Notes (Signed)
This is a 67 year old male who returns for followup today. He underwent aortobifemoral on December 18. This was done for a 5.5 cm abdominal aortic aneurysm as well as iliac occlusive disease. He states that he is ambulating better with less claudication symptoms. He has resumed his diet. He still has some occasional incisional pain.  Physical exam:  Filed Vitals:   02/20/12 0916  BP: 123/80  Pulse: 77  Resp: 16  Height: 5\' 10"  (1.778 m)  Weight: 162 lb (73.483 kg)  SpO2: 100%   Abdomen: Soft nontender nondistended well-healed laparotomy incision no evidence of hernia Extremities: 2+ femoral pulses bilaterally with well-healed groin incisions 2+ right dorsalis pedis pulse, absent pedal pulses left foot, left foot slightly cooler than right  Assessment: Continuing to recover from aortobifemoral bypass for abdominal aortic aneurysm and iliac occlusive disease  Plan: Patient was given a renewal of his Percocet prescription today #30 dispensed. No further refills. He'll return in 3 months for bilateral ABIs.

## 2012-03-07 ENCOUNTER — Other Ambulatory Visit: Payer: Self-pay

## 2012-03-16 ENCOUNTER — Ambulatory Visit (INDEPENDENT_AMBULATORY_CARE_PROVIDER_SITE_OTHER): Payer: BC Managed Care – PPO | Admitting: Cardiology

## 2012-03-16 ENCOUNTER — Encounter: Payer: Self-pay | Admitting: Cardiology

## 2012-03-16 VITALS — BP 120/70 | HR 73 | Ht 70.0 in | Wt 164.0 lb

## 2012-03-16 DIAGNOSIS — E782 Mixed hyperlipidemia: Secondary | ICD-10-CM

## 2012-03-16 DIAGNOSIS — I2510 Atherosclerotic heart disease of native coronary artery without angina pectoris: Secondary | ICD-10-CM

## 2012-03-16 NOTE — Assessment & Plan Note (Signed)
Due for followup FLP and LFT. Continue Zocor.

## 2012-03-16 NOTE — Assessment & Plan Note (Signed)
Symptomatically stable on medical therapy. He had a low risk Myoview in December. Continue observation for now.

## 2012-03-16 NOTE — Patient Instructions (Addendum)
Your physician recommends that you schedule a follow-up appointment in: 6 MONTHS  Your physician recommends that you return for lab work in: THIS WEEK (LIVER, LIPIDS) SLIPS GIVEN  

## 2012-03-16 NOTE — Progress Notes (Signed)
   Clinical Summary Mr. Cephas is a 67 y.o.male presenting for followup. He was last seen in November 2013.  Followup Myoview in December 2013 was low risk, no clear evidence of ischemia with LVEF 62%. He was cleared for aortobifemoral bypass that occurred subsequently in December. He last saw Dr. Darrick Penna in January.  He has been doing fairly well, no angina. Still not back to work yet.  He reports compliance with his medications. No recent assessment of lipids.  No Known Allergies  Current Outpatient Prescriptions  Medication Sig Dispense Refill  . aspirin 325 MG tablet Take 325 mg by mouth daily.      . metoprolol succinate (TOPROL XL) 25 MG 24 hr tablet Take 1 tablet (25 mg total) by mouth daily.  30 tablet  3  . oxyCODONE-acetaminophen (PERCOCET/ROXICET) 5-325 MG per tablet Take 1 tablet by mouth every 6 (six) hours as needed for pain. For pain  30 tablet  0  . oxyCODONE-acetaminophen (ROXICET) 5-325 MG per tablet Use one to two tablets every four to six hours as needed for pain  30 tablet  0  . simvastatin (ZOCOR) 20 MG tablet Take 1 tablet (20 mg total) by mouth at bedtime.  90 tablet  3   No current facility-administered medications for this visit.    Past Medical History  Diagnosis Date  . Stroke 2004  . Coronary atherosclerosis of native coronary artery     PTCA small diagonal 2007 otherwise nonobstructive CAD, EF 50-55%  . AAA (abdominal aortic aneurysm)   . Carotid artery disease     Nonobstructive  . Essential hypertension, benign   . Hyperlipidemia   . NSTEMI (non-ST elevated myocardial infarction) 2007  . TIA (transient ischemic attack) 2006  . Cataract     Social History Mr. Candela reports that he has been smoking Cigarettes.  He has a 20 pack-year smoking history. He has never used smokeless tobacco. Mr. Veldhuizen reports that he does not drink alcohol.  Review of Systems No palpitations. No bleeding episodes. Gradually increasing his activity. Otherwise  negative.  Physical Examination Filed Vitals:   03/16/12 1347  BP: 120/70  Pulse: 73   Filed Weights   03/16/12 1347  Weight: 164 lb (74.39 kg)    No acute distress.  HEENT: Conjunctiva and lids normal, oropharynx clear.  Neck: Supple, no elevated JVP, soft carotid bruits, no thyromegaly.  Lungs: Clear to auscultation, nonlabored breathing at rest.  Cardiac: Regular rate and rhythm, no S3 or significant systolic murmur, no pericardial rub.  Abdomen: Soft, nontender, bowel sounds present.  Extremities: No pitting edema, distal pulses 2+.    Problem List and Plan   Coronary atherosclerosis of native coronary artery Symptomatically stable on medical therapy. He had a low risk Myoview in December. Continue observation for now.  Mixed hyperlipidemia Due for followup FLP and LFT. Continue Zocor.    [name], M.D., F.A.C.C.

## 2012-03-17 ENCOUNTER — Telehealth: Payer: Self-pay | Admitting: *Deleted

## 2012-03-17 LAB — LIPID PANEL
Cholesterol: 188 mg/dL (ref 0–200)
HDL: 34 mg/dL — ABNORMAL LOW (ref >39)
LDL Cholesterol: 136 mg/dL — ABNORMAL HIGH (ref 0–99)
Total CHOL/HDL Ratio: 5.5 Ratio
Triglycerides: 92 mg/dL (ref <150)
VLDL: 18 mg/dL (ref 0–40)

## 2012-03-17 LAB — HEPATIC FUNCTION PANEL
ALT: <8 U/L (ref 0–53)
AST: 13 U/L (ref 0–37)
Albumin: 3.8 g/dL (ref 3.5–5.2)
Alkaline Phosphatase: 49 U/L (ref 39–117)
Bilirubin, Direct: 0.1 mg/dL (ref 0.0–0.3)
Indirect Bilirubin: 0.4 mg/dL (ref 0.0–0.9)
Total Bilirubin: 0.5 mg/dL (ref 0.3–1.2)
Total Protein: 6.7 g/dL (ref 6.0–8.3)

## 2012-03-17 NOTE — Telephone Encounter (Signed)
Patient called requesting more Percocet for pain. I discussed with him that his surgery was on 01-08-12 and that this far out, we don't Rx pain meds. He will take OTC medications for his occasional soreness. Incisions are well healed per patient and he is afebrile. Patient is in agreement with this plan and will return to see Dr. Darrick Penna as scheduled.

## 2012-03-18 MED ORDER — SIMVASTATIN 40 MG PO TABS: 40.0000 mg | ORAL_TABLET | Freq: Every day | ORAL | Status: DC

## 2012-03-18 NOTE — Addendum Note (Signed)
Addended by: Derry Lory A on: 03/18/2012 10:01 AM   Modules accepted: Orders

## 2012-05-20 ENCOUNTER — Encounter: Payer: Self-pay | Admitting: Vascular Surgery

## 2012-05-21 ENCOUNTER — Encounter: Payer: Self-pay | Admitting: Vascular Surgery

## 2012-05-21 ENCOUNTER — Encounter (INDEPENDENT_AMBULATORY_CARE_PROVIDER_SITE_OTHER): Payer: BC Managed Care – PPO | Admitting: *Deleted

## 2012-05-21 ENCOUNTER — Ambulatory Visit (INDEPENDENT_AMBULATORY_CARE_PROVIDER_SITE_OTHER): Payer: BC Managed Care – PPO | Admitting: Vascular Surgery

## 2012-05-21 VITALS — BP 132/71 | HR 73 | Ht 70.0 in | Wt 172.0 lb

## 2012-05-21 DIAGNOSIS — Z48812 Encounter for surgical aftercare following surgery on the circulatory system: Secondary | ICD-10-CM

## 2012-05-21 DIAGNOSIS — I739 Peripheral vascular disease, unspecified: Secondary | ICD-10-CM

## 2012-05-21 MED ORDER — VARENICLINE TARTRATE 1 MG PO TABS: 1.0000 mg | ORAL_TABLET | Freq: Two times a day (BID) | ORAL | Status: DC

## 2012-05-21 NOTE — Progress Notes (Signed)
This is a 67 year old male who returns for followup today. He underwent aortobifemoral on December 18. This was done for a 5.5 cm abdominal aortic aneurysm as well as iliac occlusive disease. He states that he is ambulating better with less claudication symptoms. He has resumed his diet. He has essentially return to normal status at this point. Unfortunately he continues to smoke. Greater than 3 minutes they were spent regarding smoking cessation counseling.  Review of systems: He denies shortness of breath. He denies chest pain.  Physical exam:  Filed Vitals:   05/21/12 1025  BP: 132/71  Pulse: 73  Height: 5\' 10"  (1.778 m)  Weight: 172 lb (78.019 kg)  SpO2: 100%   Extremities: 2+ DP PT right, 1+ PT left Abdomen: Soft nontender nondistended no mass Chest: Clear to auscultation bilaterally Neck: No carotid bruit   Assessment: Doing well status post aortobifem bypass. Tobacco abuse.  Plan: The patient was given a prescription today for Chantix. I did discuss with the patient today the possible side effects of nightmares related to this medication. He denies any prior history of suicidal ideation or depression. He will followup with [name] in one year.  Fabienne Bruns, MD [facility] Office: [phone] Pager: [phone]

## 2012-05-27 ENCOUNTER — Other Ambulatory Visit: Payer: Self-pay | Admitting: *Deleted

## 2012-05-27 DIAGNOSIS — I739 Peripheral vascular disease, unspecified: Secondary | ICD-10-CM

## 2012-08-26 ENCOUNTER — Other Ambulatory Visit: Payer: Self-pay

## 2012-11-26 ENCOUNTER — Other Ambulatory Visit: Payer: Self-pay

## 2013-05-27 ENCOUNTER — Encounter (HOSPITAL_COMMUNITY): Payer: BC Managed Care – PPO

## 2013-05-27 ENCOUNTER — Ambulatory Visit: Payer: BC Managed Care – PPO | Admitting: Family

## 2013-06-04 ENCOUNTER — Encounter: Payer: Self-pay | Admitting: Family

## 2013-06-07 ENCOUNTER — Other Ambulatory Visit: Payer: Self-pay | Admitting: Surgery

## 2013-06-07 ENCOUNTER — Ambulatory Visit (HOSPITAL_COMMUNITY)
Admission: RE | Admit: 2013-06-07 | Discharge: 2013-06-07 | Disposition: A | Discharge status: Home / Self Care | Payer: BC Managed Care – PPO | Source: Ambulatory Visit | Attending: Family | Admitting: Family

## 2013-06-07 ENCOUNTER — Ambulatory Visit (INDEPENDENT_AMBULATORY_CARE_PROVIDER_SITE_OTHER)
Admission: RE | Admit: 2013-06-07 | Discharge: 2013-06-07 | Disposition: A | Discharge status: Home / Self Care | Payer: BC Managed Care – PPO | Source: Ambulatory Visit | Attending: Family | Admitting: Family

## 2013-06-07 ENCOUNTER — Ambulatory Visit (INDEPENDENT_AMBULATORY_CARE_PROVIDER_SITE_OTHER): Payer: BC Managed Care – PPO | Admitting: Family

## 2013-06-07 ENCOUNTER — Encounter: Payer: Self-pay | Admitting: Family

## 2013-06-07 VITALS — BP 133/75 | HR 58 | Resp 18 | Ht 70.0 in | Wt 189.0 lb

## 2013-06-07 DIAGNOSIS — R9430 Abnormal result of cardiovascular function study, unspecified: Secondary | ICD-10-CM

## 2013-06-07 DIAGNOSIS — Z48812 Encounter for surgical aftercare following surgery on the circulatory system: Secondary | ICD-10-CM

## 2013-06-07 DIAGNOSIS — R209 Unspecified disturbances of skin sensation: Secondary | ICD-10-CM | POA: Insufficient documentation

## 2013-06-07 DIAGNOSIS — Z9889 Other specified postprocedural states: Secondary | ICD-10-CM | POA: Insufficient documentation

## 2013-06-07 DIAGNOSIS — I739 Peripheral vascular disease, unspecified: Secondary | ICD-10-CM

## 2013-06-07 NOTE — Progress Notes (Signed)
[facility] [place] HISTORY AND PHYSICAL -PAD  History of Present Illness Darryl Ward is a 68 y.o. male patient of Dr. Oneida Alar who returns for followup today. 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 Pt denies claudication symptoms in legs with walking, but he does have paresthesia in both feet, left worse than right, but this is only when he is resting, paresthesia is not present with walking, denies non healing wounds. He walks 12 hours/day as part of his job. He had a TIA in 2006 as manifested by loss of balance, denies amaurosis fugax, does not remember if he had hemiparesis or aphasia; denies any residual neurological deficit. He had an MI in 2008 or 2009, had one cardiac stent placed, did not have a CABG. Bilateral carotid Duplex was performed in 2013 which demonstrated <40% bilateral ICA stenosis.   The patient denies New Medical or Surgical History.  Pt Diabetic: No Pt smoker: smoker  (1/2 ppd x 25 yrs); he had nausea with Chantix use  Pt meds include: Statin :Yes Betablocker: Yes ASA: Yes Other anticoagulants/antiplatelets: no  Past Medical History  Diagnosis Date  . Stroke 2004  . Coronary atherosclerosis of native coronary artery     PTCA small diagonal 2007 otherwise nonobstructive CAD, EF 50-55%  . AAA (abdominal aortic aneurysm)   . Carotid artery disease     Nonobstructive  . Essential hypertension, benign   . Hyperlipidemia   . NSTEMI (non-ST elevated myocardial infarction) 2007  . TIA (transient ischemic attack) 2006  . Cataract     Social History History  Substance Use Topics  . Smoking status: Current Every Day Smoker -- 0.50 packs/day for 40 years    Types: Cigarettes  . Smokeless tobacco: Never Used  . Alcohol Use: No     Comment: Prior history of regular alcohol use    Family History Family History  Problem Relation Age of  Onset  . Hyperlipidemia Sister   . Heart attack Brother     Past Surgical History  Procedure Laterality Date  . Left cataract surgery    . Aorta - bilateral femoral artery bypass graft  01/08/2012    Procedure: AORTA BIFEMORAL BYPASS GRAFT;  Surgeon: Elam Dutch, MD;  Location: [facility] OR;  Service: Vascular;  Laterality: Bilateral;  using 18x23mm x 40cm Hemashield Gold Vascular Graft with Endarterectomy, Thombectomy and  Reimplantation of Inferior Mesenteric Artery    No Known Allergies  Current Outpatient Prescriptions  Medication Sig Dispense Refill  . aspirin 325 MG tablet Take 325 mg by mouth daily.      . metoprolol succinate (TOPROL XL) 25 MG 24 hr tablet Take 1 tablet (25 mg total) by mouth daily.  30 tablet  3  . simvastatin (ZOCOR) 40 MG tablet Take 1 tablet (40 mg total) by mouth at bedtime.  90 tablet  1  . oxyCODONE-acetaminophen (PERCOCET/ROXICET) 5-325 MG per tablet Take 1 tablet by mouth every 6 (six) hours as needed for pain. For pain  30 tablet  0  . oxyCODONE-acetaminophen (ROXICET) 5-325 MG per tablet Use one to two tablets every four to six hours as needed for pain  30 tablet  0  . varenicline (CHANTIX) 1 MG tablet Take 1 tablet (1 mg total) by mouth 2 (two) times daily.  60 tablet  6   No current facility-administered medications for this visit.    ROS: See  HPI for pertinent positives and negatives.   Physical Examination  Filed Vitals:   06/07/13 1247  BP: 133/75  Pulse: 58  Resp: 18  Height: 5\' 10"  (1.778 m)  Weight: 189 lb (85.73 kg)   Body mass index is 27.12 kg/(m^2).  General: A&O x 3, WDWN. Gait: normal Eyes: PERRLA. Pulmonary: CTAB, without wheezes , rales or rhonchi. Cardiac: regular Rythm , without detected murmur.         Carotid Bruits Left Right   Negative Negative  Aorta is not palpable. Radial pulses: are 2+ palpable and =                           VASCULAR EXAM: Extremities without ischemic changes  without Gangrene; without  open wounds.                                                                                                          LE Pulses LEFT RIGHT       FEMORAL  3+ palpable  3+ palpable        POPLITEAL  not palpable   not palpable       POSTERIOR TIBIAL  not palpable   3+ palpable        DORSALIS PEDIS      ANTERIOR TIBIAL 1+ palpable  2+palpable    Abdomen: soft, NT, no masses. Skin: no rashes, no ulcers noted. Musculoskeletal: no muscle wasting or atrophy.  Neurologic: A&O X 3; Appropriate Affect ; SENSATION: normal; MOTOR FUNCTION:  moving all extremities equally, motor strength 5/5 throughout. Speech is fluent/normal. CN 2-12 intact.  Non-Invasive Vascular Imaging: DATE: 06/07/2013 LOWER EXTREMITY ARTERIAL DUPLEX EVALUATION    INDICATION: Decreased left ankle-brachial index    PREVIOUS INTERVENTION(S): Aorta-bifemoral bypass graft in 01/08/12    DUPLEX EXAM:     RIGHT  LEFT   Peak Systolic Velocity (cm/s) Ratio (if abnormal) Waveform  Peak Systolic Velocity (cm/s) Ratio (if abnormal) Waveform     Common Femoral Artery 84  T     Deep Femoral Artery 154  T     Superficial Femoral Artery Proximal 41  B     Superficial Femoral Artery Mid 47/112 2.4 M     Superficial Femoral Artery Distal 65  M     Popliteal Artery 50  M     Posterior Tibial Artery Dist 51  M     Anterior Tibial Artery Distal Not Visualized       Peroneal Artery Distal 17  M  1.18, triphasic (PT), biphasic (DP) Today's ABI / TBI 0.86, monophasic  1.22 Previous ABI / TBI (05/21/12 ) 1.05    Waveform:    M - Monophasic       B - Biphasic       T - Triphasic  If Ankle Brachial Index (ABI) or Toe Brachial Index (TBI) performed, please see complete report     ADDITIONAL FINDINGS: See attached diagram for additional information.     IMPRESSION: 1. Doppler velocities suggest a greater  than 50% stenosis of the left mid superficial femoral artery. 2. Patent left limb of aorta-bifemoral bypass graft noted, based on  limited visualization.    Compared to the previous exam:  No previous duplex exam available for comparison.     ASSESSMENT: Darryl Ward is a 68 y.o. 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 Pt denies claudication symptoms in legs with walking, he does no have non healing wounds.  Doppler velocities suggest a greater than 50% stenosis of the left mid superficial femoral artery. Patent left limb of aorta-bifemoral bypass graft noted, based on limited visualization. He does not have DM, but unfortunately continues to smoke, he tried Chantix which caused nausea.  PLAN:  Patient was counseled re smoking cessation, was given information re a smoking cessation class. I discussed in depth with the patient the nature of atherosclerosis, and emphasized the importance of maximal medical management including strict control of blood pressure, blood glucose, and lipid levels, obtaining regular exercise, and cessation of smoking.  The patient is aware that without maximal medical management the underlying atherosclerotic disease process will progress, limiting the benefit of any interventions.  Based on the patient's vascular studies and examination, and after discussing with Dr. Trula Slade,  pt will return to clinic in 6 months for ABI's and left LE arterial Duplex.  The patient was given information about PAD including signs, symptoms, treatment, what symptoms should prompt the patient to seek immediate medical care, and risk reduction measures to take.  Clemon Chambers, RN, MSN, FNP-C [facility] Phone: [phone]  Clinic MD: Trula Slade  06/07/2013 1:01 PM

## 2013-06-07 NOTE — Patient Instructions (Signed)
Peripheral Vascular Disease Peripheral Vascular Disease (PVD), also called Peripheral Arterial Disease (PAD), is a circulation problem caused by cholesterol (atherosclerotic plaque) deposits in the arteries. PVD commonly occurs in the lower extremities (legs) but it can occur in other areas of the body, such as your arms. The cholesterol buildup in the arteries reduces blood flow which can cause pain and other serious problems. The presence of PVD can place a person at risk for Coronary Artery Disease (CAD).  CAUSES  Causes of PVD can be many. It is usually associated with more than one risk factor such as:   High Cholesterol.  Smoking.  Diabetes.  Lack of exercise or inactivity.  High blood pressure (hypertension).  Obesity.  Family history. SYMPTOMS   When the lower extremities are affected, patients with PVD may experience:  Leg pain with exertion or physical activity. This is called INTERMITTENT CLAUDICATION. This may present as cramping or numbness with physical activity. The location of the pain is associated with the level of blockage. For example, blockage at the abdominal level (distal abdominal aorta) may result in buttock or hip pain. Lower leg arterial blockage may result in calf pain.  As PVD becomes more severe, pain can develop with less physical activity.  In people with severe PVD, leg pain may occur at rest.  Other PVD signs and symptoms:  Leg numbness or weakness.  Coldness in the affected leg or foot, especially when compared to the other leg.  A change in leg color.  Patients with significant PVD are more prone to ulcers or sores on toes, feet or legs. These may take longer to heal or may reoccur. The ulcers or sores can become infected.  If signs and symptoms of PVD are ignored, gangrene may occur. This can result in the loss of toes or loss of an entire limb.  Not all leg pain is related to PVD. Other medical conditions can cause leg pain such  as:  Blood clots (embolism) or Deep Vein Thrombosis.  Inflammation of the blood vessels (vasculitis).  Spinal stenosis. DIAGNOSIS  Diagnosis of PVD can involve several different types of tests. These can include:  Pulse Volume Recording Method (PVR). This test is simple, painless and does not involve the use of X-rays. PVR involves measuring and comparing the blood pressure in the arms and legs. An ABI (Ankle-Brachial Index) is calculated. The normal ratio of blood pressures is 1. As this number becomes smaller, it indicates more severe disease.  < 0.95  indicates significant narrowing in one or more leg vessels.  <0.8 there will usually be pain in the foot, leg or buttock with exercise.  <0.4 will usually have pain in the legs at rest.  <0.25  usually indicates limb threatening PVD.  Doppler detection of pulses in the legs. This test is painless and checks to see if you have a pulses in your legs/feet.  A dye or contrast material (a substance that highlights the blood vessels so they show up on x-ray) may be given to help your caregiver better see the arteries for the following tests. The dye is eliminated from your body by the kidney's. Your caregiver may order blood work to check your kidney function and other laboratory values before the following tests are performed:  Magnetic Resonance Angiography (MRA). An MRA is a picture study of the blood vessels and arteries. The MRA machine uses a large magnet to produce images of the blood vessels.  Computed Tomography Angiography (CTA). A CTA is a   specialized x-ray that looks at how the blood flows in your blood vessels. An IV may be inserted into your arm so contrast dye can be injected.  Angiogram. Is a procedure that uses x-rays to look at your blood vessels. This procedure is minimally invasive, meaning a small incision (cut) is made in your groin. A small tube (catheter) is then inserted into the artery of your groin. The catheter is  guided to the blood vessel or artery your caregiver wants to examine. Contrast dye is injected into the catheter. X-rays are then taken of the blood vessel or artery. After the images are obtained, the catheter is taken out. TREATMENT  Treatment of PVD involves many interventions which may include:  Lifestyle changes:  Quitting smoking.  Exercise.  Following a low fat, low cholesterol diet.  Control of diabetes.  Foot care is very important to the PVD patient. Good foot care can help prevent infection.  Medication:  Cholesterol-lowering medicine.  Blood pressure medicine.  Anti-platelet drugs.  Certain medicines may reduce symptoms of Intermittent Claudication.  Interventional/Surgical options:  Angioplasty. An Angioplasty is a procedure that inflates a balloon in the blocked artery. This opens the blocked artery to improve blood flow.  Stent Implant. A wire mesh tube (stent) is placed in the artery. The stent expands and stays in place, allowing the artery to remain open.  Peripheral Bypass Surgery. This is a surgical procedure that reroutes the blood around a blocked artery to help improve blood flow. This type of procedure may be performed if Angioplasty or stent implants are not an option. SEEK IMMEDIATE MEDICAL CARE IF:   You develop pain or numbness in your arms or legs.  Your arm or leg turns cold, becomes blue in color.  You develop redness, warmth, swelling and pain in your arms or legs. MAKE SURE YOU:   Understand these instructions.  Will watch your condition.  Will get help right away if you are not doing well or get worse. Document Released: 02/15/2004 Document Revised: 04/01/2011 Document Reviewed: 01/12/2008 ExitCare Patient Information 2014 [employer].   Smoking Cessation Quitting smoking is important to your health and has many advantages. However, it is not always easy to quit since nicotine is a very addictive drug. Often times, people try 3  times or more before being able to quit. This document explains the best ways for you to prepare to quit smoking. Quitting takes hard work and a lot of effort, but you can do it. ADVANTAGES OF QUITTING SMOKING  You will live longer, feel better, and live better.  Your body will feel the impact of quitting smoking almost immediately.  Within 20 minutes, blood pressure decreases. Your pulse returns to its normal level.  After 8 hours, carbon monoxide levels in the blood return to normal. Your oxygen level increases.  After 24 hours, the chance of having a heart attack starts to decrease. Your breath, hair, and body stop smelling like smoke.  After 48 hours, damaged nerve endings begin to recover. Your sense of taste and smell improve.  After 72 hours, the body is virtually free of nicotine. Your bronchial tubes relax and breathing becomes easier.  After 2 to 12 weeks, lungs can hold more air. Exercise becomes easier and circulation improves.  The risk of having a heart attack, stroke, cancer, or lung disease is greatly reduced.  After 1 year, the risk of coronary heart disease is cut in half.  After 5 years, the risk of stroke falls to   the same as a nonsmoker.  After 10 years, the risk of lung cancer is cut in half and the risk of other cancers decreases significantly.  After 15 years, the risk of coronary heart disease drops, usually to the level of a nonsmoker.  If you are pregnant, quitting smoking will improve your chances of having a healthy baby.  The people you live with, especially any children, will be healthier.  You will have extra money to spend on things other than cigarettes. QUESTIONS TO THINK ABOUT BEFORE ATTEMPTING TO QUIT You may want to talk about your answers with your caregiver.  Why do you want to quit?  If you tried to quit in the past, what helped and what did not?  What will be the most difficult situations for you after you quit? How will you plan to  handle them?  Who can help you through the tough times? Your family? Friends? A caregiver?  What pleasures do you get from smoking? What ways can you still get pleasure if you quit? Here are some questions to ask your caregiver:  How can you help me to be successful at quitting?  What medicine do you think would be best for me and how should I take it?  What should I do if I need more help?  What is smoking withdrawal like? How can I get information on withdrawal? GET READY  Set a quit date.  Change your environment by getting rid of all cigarettes, ashtrays, matches, and lighters in your home, car, or work. Do not let people smoke in your home.  Review your past attempts to quit. Think about what worked and what did not. GET SUPPORT AND ENCOURAGEMENT You have a better chance of being successful if you have help. You can get support in many ways.  Tell your family, friends, and co-workers that you are going to quit and need their support. Ask them not to smoke around you.  Get individual, group, or telephone counseling and support. Programs are available at local hospitals and health centers. Call your local health department for information about programs in your area.  Spiritual beliefs and practices may help some smokers quit.  Download a "quit meter" on your computer to keep track of quit statistics, such as how long you have gone without smoking, cigarettes not smoked, and money saved.  Get a self-help book about quitting smoking and staying off of tobacco. LEARN NEW SKILLS AND BEHAVIORS  Distract yourself from urges to smoke. Talk to someone, go for a walk, or occupy your time with a task.  Change your normal routine. Take a different route to work. Drink tea instead of coffee. Eat breakfast in a different place.  Reduce your stress. Take a hot bath, exercise, or read a book.  Plan something enjoyable to do every day. Reward yourself for not smoking.  Explore  interactive web-based programs that specialize in helping you quit. GET MEDICINE AND USE IT CORRECTLY Medicines can help you stop smoking and decrease the urge to smoke. Combining medicine with the above behavioral methods and support can greatly increase your chances of successfully quitting smoking.  Nicotine replacement therapy helps deliver nicotine to your body without the negative effects and risks of smoking. Nicotine replacement therapy includes nicotine gum, lozenges, inhalers, nasal sprays, and skin patches. Some may be available over-the-counter and others require a prescription.  Antidepressant medicine helps people abstain from smoking, but how this works is unknown. This medicine is available by prescription.    Nicotinic receptor partial agonist medicine simulates the effect of nicotine in your brain. This medicine is available by prescription. Ask your caregiver for advice about which medicines to use and how to use them based on your health history. Your caregiver will tell you what side effects to look out for if you choose to be on a medicine or therapy. Carefully read the information on the package. Do not use any other product containing nicotine while using a nicotine replacement product.  RELAPSE OR DIFFICULT SITUATIONS Most relapses occur within the first 3 months after quitting. Do not be discouraged if you start smoking again. Remember, most people try several times before finally quitting. You may have symptoms of withdrawal because your body is used to nicotine. You may crave cigarettes, be irritable, feel very hungry, cough often, get headaches, or have difficulty concentrating. The withdrawal symptoms are only temporary. They are strongest when you first quit, but they will go away within 10 14 days. To reduce the chances of relapse, try to:  Avoid drinking alcohol. Drinking lowers your chances of successfully quitting.  Reduce the amount of caffeine you consume. Once you  quit smoking, the amount of caffeine in your body increases and can give you symptoms, such as a rapid heartbeat, sweating, and anxiety.  Avoid smokers because they can make you want to smoke.  Do not let weight gain distract you. Many smokers will gain weight when they quit, usually less than 10 pounds. Eat a healthy diet and stay active. You can always lose the weight gained after you quit.  Find ways to improve your mood other than smoking. FOR MORE INFORMATION  www.smokefree.gov  Document Released: 01/01/2001 Document Revised: 07/09/2011 Document Reviewed: 04/18/2011 ExitCare Patient Information 2014 [employer].  

## 2013-06-07 NOTE — Addendum Note (Signed)
Addended by: Mena Goes on: 06/07/2013 02:05 PM   Modules accepted: Orders

## 2013-08-31 IMAGING — CR DG ABD PORTABLE 1V
1 series · 1 of 1 positions shown · non-contrast
Comparison: CT 12/10/2011

CLINICAL DATA: Postop AAA stent

PORTABLE ABDOMEN - 1 VIEW

[AP]
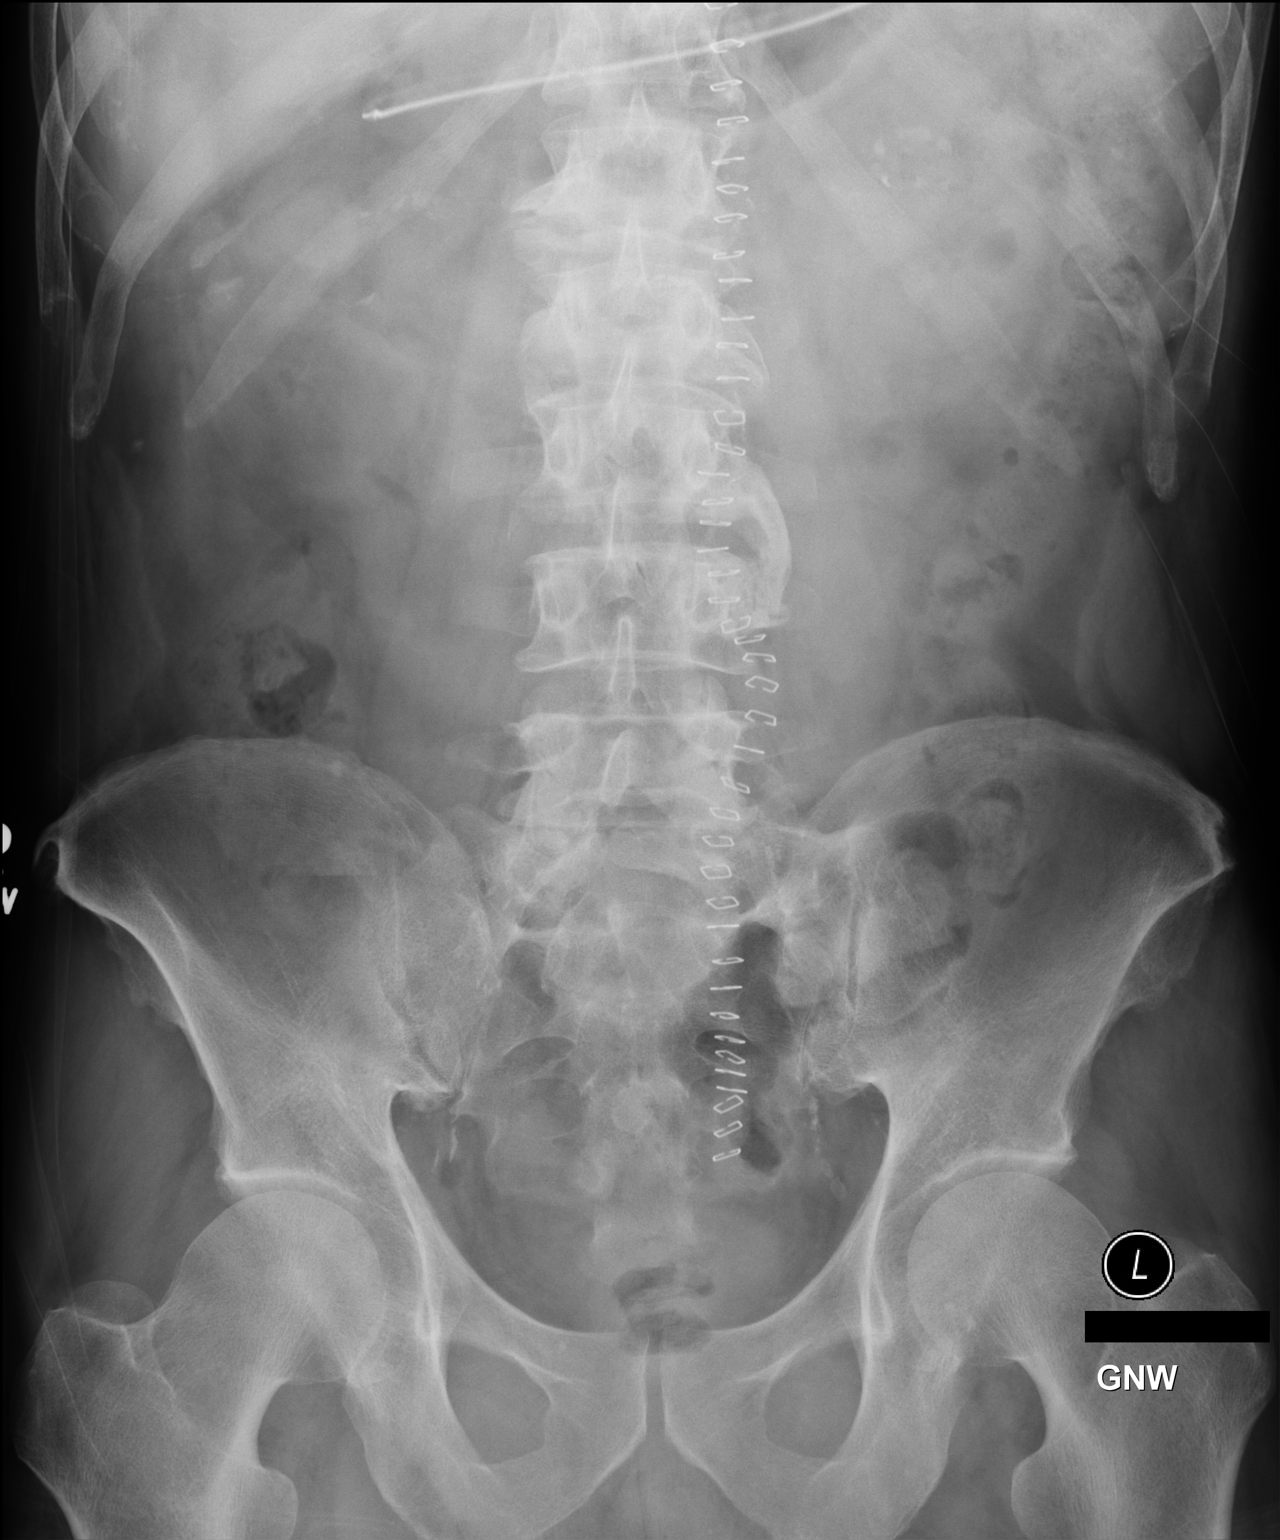

[1 of 1 positions shown; findings below may reference images not displayed]

FINDINGS: Midline abdominal staples.  Normal bowel gas pattern.  NG
tube in the stomach.  No retained instrument.  Renal artery
calcification bilaterally.
IMPRESSION: No retained instrument following abdominal surgery for aortic
aneurysm.

## 2013-09-01 IMAGING — CR DG CHEST 1V PORT
1 series · 1 of 1 positions shown · non-contrast
Comparison: 01/08/2012

CLINICAL DATA: Postop from vascular surgery

PORTABLE CHEST - 1 VIEW

[AP]
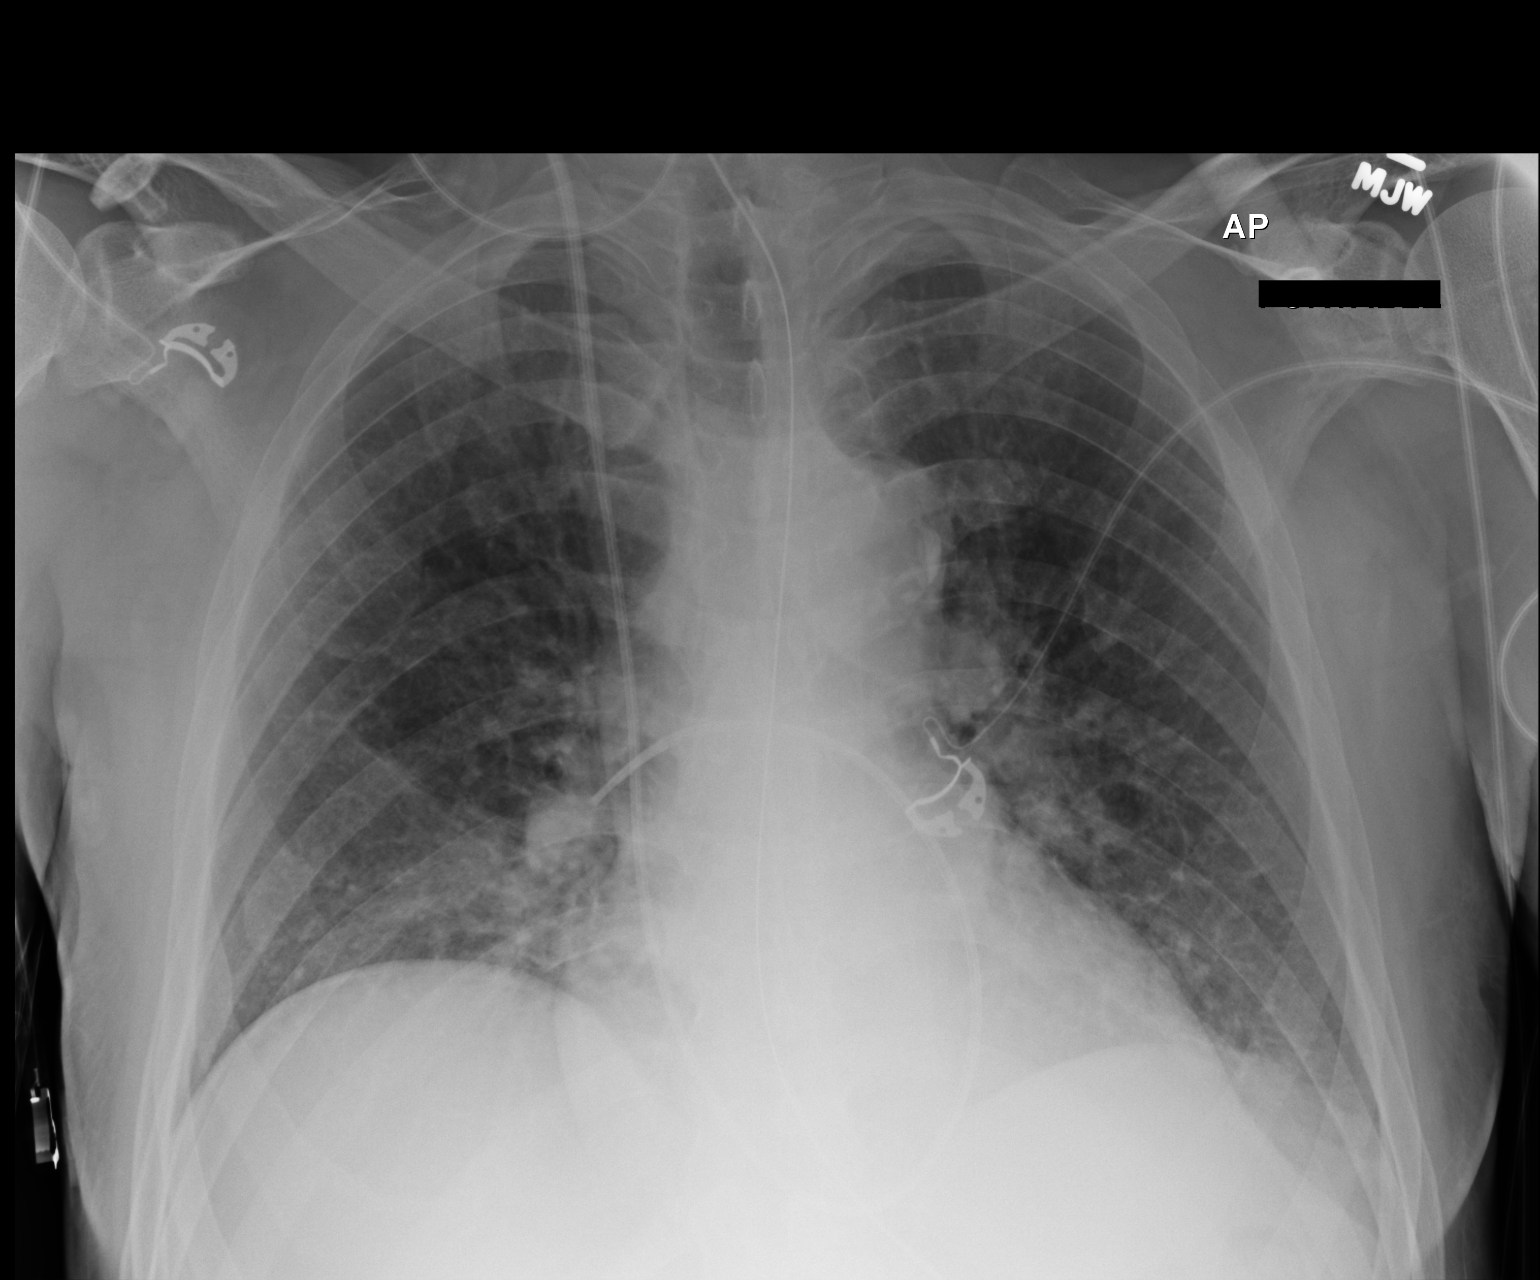

[1 of 1 positions shown; findings below may reference images not displayed]

FINDINGS: There are reticular opacities at the lung bases most
likely atelectasis.  Bronchovascular prominence bilaterally is
stable without overt pulmonary edema.

No pneumothorax.

The right internal jugular Swan-Ganz catheter has its tip in the
right pulmonary artery.  The nasogastric tube passes below the
diaphragm into the stomach.
IMPRESSION: Mild bibasilar atelectasis.  No overt pulmonary edema.  Support
apparatus is stable well-positioned.

## 2013-12-10 ENCOUNTER — Encounter: Payer: Self-pay | Admitting: Family

## 2013-12-13 ENCOUNTER — Other Ambulatory Visit (HOSPITAL_COMMUNITY): Payer: BC Managed Care – PPO

## 2013-12-13 ENCOUNTER — Encounter (HOSPITAL_COMMUNITY): Payer: BC Managed Care – PPO

## 2013-12-13 ENCOUNTER — Ambulatory Visit: Payer: BC Managed Care – PPO | Admitting: Family

## 2014-01-27 ENCOUNTER — Encounter: Payer: Self-pay | Admitting: Family

## 2014-01-28 ENCOUNTER — Ambulatory Visit: Payer: BC Managed Care – PPO | Admitting: Family

## 2014-01-28 ENCOUNTER — Encounter (HOSPITAL_COMMUNITY): Payer: BC Managed Care – PPO

## 2014-01-28 ENCOUNTER — Other Ambulatory Visit (HOSPITAL_COMMUNITY): Payer: BC Managed Care – PPO

## 2014-02-17 ENCOUNTER — Encounter: Payer: Self-pay | Admitting: Family

## 2014-02-18 ENCOUNTER — Ambulatory Visit: Payer: Self-pay | Admitting: Family

## 2014-02-18 ENCOUNTER — Other Ambulatory Visit (HOSPITAL_COMMUNITY): Payer: Medicare Other

## 2014-02-18 ENCOUNTER — Encounter (HOSPITAL_COMMUNITY): Payer: Self-pay

## 2015-11-20 ENCOUNTER — Other Ambulatory Visit: Payer: Self-pay

## 2015-11-20 MED ORDER — METOPROLOL SUCCINATE ER 25 MG PO TB24: 25.0000 mg | ORAL_TABLET | Freq: Every day | ORAL | 0 refills | Status: DC

## 2015-12-27 ENCOUNTER — Other Ambulatory Visit: Payer: Self-pay | Admitting: Physician Assistant

## 2015-12-27 NOTE — Telephone Encounter (Signed)
Please review and advise.

## 2016-01-01 NOTE — Telephone Encounter (Signed)
RX sent into [facility] per pt request Okayed per Particia Nearing Pt notified

## 2016-01-31 ENCOUNTER — Other Ambulatory Visit: Payer: Self-pay | Admitting: Physician Assistant

## 2016-05-22 ENCOUNTER — Other Ambulatory Visit: Payer: Self-pay | Admitting: Physician Assistant

## 2016-06-12 ENCOUNTER — Other Ambulatory Visit: Payer: Self-pay | Admitting: Physician Assistant

## 2016-09-09 ENCOUNTER — Ambulatory Visit (INDEPENDENT_AMBULATORY_CARE_PROVIDER_SITE_OTHER): Payer: BLUE CROSS/BLUE SHIELD

## 2016-09-09 ENCOUNTER — Ambulatory Visit: Payer: Self-pay | Admitting: Physician Assistant

## 2016-09-09 ENCOUNTER — Ambulatory Visit (INDEPENDENT_AMBULATORY_CARE_PROVIDER_SITE_OTHER): Payer: BLUE CROSS/BLUE SHIELD | Admitting: Nurse Practitioner

## 2016-09-09 ENCOUNTER — Encounter: Payer: Self-pay | Admitting: Nurse Practitioner

## 2016-09-09 VITALS — BP 142/81 | HR 59 | Temp 96.9°F | Ht 70.0 in | Wt 174.0 lb

## 2016-09-09 DIAGNOSIS — M069 Rheumatoid arthritis, unspecified: Secondary | ICD-10-CM

## 2016-09-09 DIAGNOSIS — I739 Peripheral vascular disease, unspecified: Secondary | ICD-10-CM

## 2016-09-09 DIAGNOSIS — F17200 Nicotine dependence, unspecified, uncomplicated: Secondary | ICD-10-CM

## 2016-09-09 DIAGNOSIS — I714 Abdominal aortic aneurysm, without rupture, unspecified: Secondary | ICD-10-CM

## 2016-09-09 DIAGNOSIS — I2510 Atherosclerotic heart disease of native coronary artery without angina pectoris: Secondary | ICD-10-CM

## 2016-09-09 DIAGNOSIS — E782 Mixed hyperlipidemia: Secondary | ICD-10-CM | POA: Diagnosis not present

## 2016-09-09 MED ORDER — OXYCODONE-ACETAMINOPHEN 5-325 MG PO TABS: 1.0000 | ORAL_TABLET | Freq: Four times a day (QID) | ORAL | 0 refills | Status: DC | PRN

## 2016-09-09 MED ORDER — METOPROLOL SUCCINATE ER 25 MG PO TB24: 25.0000 mg | ORAL_TABLET | Freq: Every day | ORAL | 0 refills | Status: DC

## 2016-09-09 MED ORDER — SIMVASTATIN 40 MG PO TABS: 40.0000 mg | ORAL_TABLET | Freq: Every day | ORAL | 1 refills | Status: DC

## 2016-09-09 NOTE — Progress Notes (Signed)
Subjective:    Patient ID: Darryl Ward, male    DOB: 10/18/1945, 71 y.o.   MRN: [id number]  HPI   Darryl Ward is here today for follow up of chronic medical problem. He is a new patient at our office who was suppose to see A. Ronnald Ramp, PA today who is out today. He has lots of heart and PVD issues and goes to the [facility] in [place]. He is seen 1x a year. He is brought in by his x wife. She says he had a CVA in 2008  And his trouble with his gait and falls frequently. He says he falls daily. Does not use a cane or walker to staedy himself.  Outpatient Encounter Prescriptions as of 09/09/2016  Medication Sig  . aspirin 325 MG tablet Take 325 mg by mouth daily.  . metoprolol succinate (TOPROL-XL) 25 MG 24 hr tablet TAKE 1 TABLET (25 MG TOTAL) BY MOUTH DAILY.  [name] oxyCODONE-acetaminophen (PERCOCET/ROXICET) 5-325 MG per tablet Take 1 tablet by mouth every 6 (six) hours as needed for pain. For pain  . oxyCODONE-acetaminophen (ROXICET) 5-325 MG per tablet Use one to two tablets every four to six hours as needed for pain  . simvastatin (ZOCOR) 40 MG tablet Take 1 tablet (40 mg total) by mouth at bedtime.  . varenicline (CHANTIX) 1 MG tablet Take 1 tablet (1 mg total) by mouth 2 (two) times daily.   No facility-administered encounter medications on file as of 09/09/2016.     1. PVD (peripheral vascular disease) ([place])   2. Peripheral vascular disease, unspecified ([place])   3. Atherosclerosis of native coronary artery of native heart without angina pectoris   4. Aneurysm of abdominal vessel ([place])   5. Mixed hyperlipidemia     New complaints: He has gotten oxycodone in the past for low back  Pain, and arthritis in both hands.   Social history: Lives by himself. Ex wife checks on him daily. Keeps cell phne in his pocket at all itmes to use if he needs help.     Review of Systems  Constitutional: Negative.   Respiratory: Negative for cough, chest tightness and shortness of breath.     Cardiovascular: Negative for chest pain, palpitations and leg swelling.  Gastrointestinal: Negative.   Genitourinary: Negative.   Skin: Negative.   Neurological: Negative.   Psychiatric/Behavioral: Negative.   All other systems reviewed and are negative.      Objective:   Physical Exam  Constitutional: He is oriented to person, place, and time. He appears well-developed and well-nourished.  HENT:  Head: Normocephalic.  Right Ear: External ear normal.  Left Ear: External ear normal.  Nose: Nose normal.  Mouth/Throat: Oropharynx is clear and moist.  Eyes: Pupils are equal, round, and reactive to light. EOM are normal.  Neck: Normal range of motion. Neck supple. No JVD present. No thyromegaly present.  Cardiovascular: Normal rate, regular rhythm, normal heart sounds and intact distal pulses.  Exam reveals no gallop and no friction rub.   No murmur heard. Pulmonary/Chest: Effort normal and breath sounds normal. No respiratory distress. He has no wheezes. He has no rales. He exhibits no tenderness.  Abdominal: Soft. Bowel sounds are normal. He exhibits no mass. There is no tenderness.  Genitourinary: Prostate normal and penis normal.  Musculoskeletal: Normal range of motion. He exhibits no edema.  Lymphadenopathy:    He has no cervical adenopathy.  Neurological: He is alert and oriented to person, place, and time. No cranial  nerve deficit.  Skin: Skin is warm and dry.  Psychiatric: He has a normal mood and affect. His behavior is normal. Judgment and thought content normal.   BP (!) 142/81   Pulse (!) 59   Temp (!) 96.9 F (36.1 C) (Oral)   Ht _0  (1.778 m)   Wt 174 lb (78.9 kg)   BMI 24.97 kg/m   Chest x ray - radiology report pending-Preliminary reading by Ronnald Collum, FNP  [facility] ([place])       Assessment & Plan:  1. PVD (peripheral vascular disease) ([place])  2. Peripheral vascular disease, unspecified (HCC) - metoprolol succinate (TOPROL-XL) 25 MG 24 hr tablet; Take 1 tablet  (25 mg total) by mouth daily.  Dispense: 30 tablet; Refill: 0  3. Atherosclerosis of native coronary artery of native heart without angina pectoris  4. Aneurysm of abdominal vessel ([place])  5. Mixed hyperlipidemia Low fat diet - simvastatin (ZOCOR) 40 MG tablet; Take 1 tablet (40 mg total) by mouth at bedtime.  Dispense: 90 tablet; Refill: 1 - CMP14+EGFR - Lipid panel  6. Rheumatoid arthritis involving multiple sites, unspecified rheumatoid factor presence ([place]) will need appointment for pain management if wants more medication - oxyCODONE-acetaminophen (PERCOCET/ROXICET) 5-325 MG tablet; Take 1 tablet by mouth every 6 (six) hours as needed. For pain  Dispense: 30 tablet; Refill: 0  7. Smoker - DG Chest 2 View; Future    Labs pending Health maintenance reviewed Diet and exercise encouraged Continue all meds Follow up  In 6 month   [place], FNP

## 2016-09-09 NOTE — Patient Instructions (Signed)
Steps to Quit Smoking Smoking tobacco can be bad for your health. It can also affect almost every organ in your body. Smoking puts you and people around you at risk for many serious long-lasting (chronic) diseases. Quitting smoking is hard, but it is one of the best things that you can do for your health. It is never too late to quit. What are the benefits of quitting smoking? When you quit smoking, you lower your risk for getting serious diseases and conditions. They can include:  Lung cancer or lung disease.  Heart disease.  Stroke.  Heart attack.  Not being able to have children (infertility).  Weak bones (osteoporosis) and broken bones (fractures).  If you have coughing, wheezing, and shortness of breath, those symptoms may get better when you quit. You may also get sick less often. If you are pregnant, quitting smoking can help to lower your chances of having a baby of low birth weight. What can I do to help me quit smoking? Talk with your doctor about what can help you quit smoking. Some things you can do (strategies) include:  Quitting smoking totally, instead of slowly cutting back how much you smoke over a period of time.  Going to in-person counseling. You are more likely to quit if you go to many counseling sessions.  Using resources and support systems, such as: ? Online chats with a counselor. ? Phone quitlines. ? Printed self-help materials. ? Support groups or group counseling. ? Text messaging programs. ? Mobile phone apps or applications.  Taking medicines. Some of these medicines may have nicotine in them. If you are pregnant or breastfeeding, do not take any medicines to quit smoking unless your doctor says it is okay. Talk with your doctor about counseling or other things that can help you.  Talk with your doctor about using more than one strategy at the same time, such as taking medicines while you are also going to in-person counseling. This can help make  quitting easier. What things can I do to make it easier to quit? Quitting smoking might feel very hard at first, but there is a lot that you can do to make it easier. Take these steps:  Talk to your family and friends. Ask them to support and encourage you.  Call phone quitlines, reach out to support groups, or work with a counselor.  Ask people who smoke to not smoke around you.  Avoid places that make you want (trigger) to smoke, such as: ? Bars. ? Parties. ? Smoke-break areas at work.  Spend time with people who do not smoke.  Lower the stress in your life. Stress can make you want to smoke. Try these things to help your stress: ? Getting regular exercise. ? Deep-breathing exercises. ? Yoga. ? Meditating. ? Doing a body scan. To do this, close your eyes, focus on one area of your body at a time from head to toe, and notice which parts of your body are tense. Try to relax the muscles in those areas.  Download or buy apps on your mobile phone or tablet that can help you stick to your quit plan. There are many free apps, such as QuitGuide from the CDC (Centers for Disease Control and Prevention). You can find more support from smokefree.gov and other websites.  This information is not intended to replace advice given to you by your health care provider. Make sure you discuss any questions you have with your health care provider. Document Released: 11/03/2008 Document   Revised: 09/05/2015 Document Reviewed: 05/24/2014 Elsevier Interactive Patient Education  2018 Elsevier Inc.  

## 2016-09-10 LAB — CMP14+EGFR
ALT: 8 IU/L (ref 0–44)
AST: 15 IU/L (ref 0–40)
Albumin/Globulin Ratio: 1.5 (ref 1.2–2.2)
Albumin: 4.2 g/dL (ref 3.6–4.8)
Alkaline Phosphatase: 61 IU/L (ref 39–117)
BUN/Creatinine Ratio: 14 (ref 10–24)
BUN: 11 mg/dL (ref 8–27)
Bilirubin Total: 0.5 mg/dL (ref 0.0–1.2)
CO2: 20 mmol/L (ref 20–29)
Calcium: 9.3 mg/dL (ref 8.6–10.2)
Chloride: 104 mmol/L (ref 96–106)
Creatinine, Ser: 0.8 mg/dL (ref 0.76–1.27)
GFR calc Af Amer: 105 mL/min/{1.73_m2} (ref >59)
GFR calc non Af Amer: 91 mL/min/{1.73_m2} (ref >59)
Globulin, Total: 2.8 g/dL (ref 1.5–4.5)
Glucose: 79 mg/dL (ref 65–99)
Potassium: 4.4 mmol/L (ref 3.5–5.2)
Sodium: 140 mmol/L (ref 134–144)
Total Protein: 7 g/dL (ref 6.0–8.5)

## 2016-09-10 LAB — LIPID PANEL
Chol/HDL Ratio: 5.5 ratio — ABNORMAL HIGH (ref 0.0–5.0)
Cholesterol, Total: 203 mg/dL — ABNORMAL HIGH (ref 100–199)
HDL: 37 mg/dL — ABNORMAL LOW (ref >39)
LDL Calculated: 147 mg/dL — ABNORMAL HIGH (ref 0–99)
Triglycerides: 93 mg/dL (ref 0–149)
VLDL Cholesterol Cal: 19 mg/dL (ref 5–40)

## 2016-09-11 ENCOUNTER — Other Ambulatory Visit: Payer: Self-pay | Admitting: Nurse Practitioner

## 2016-09-11 MED ORDER — ROSUVASTATIN CALCIUM 10 MG PO TABS: 10.0000 mg | ORAL_TABLET | Freq: Every day | ORAL | 1 refills | Status: DC

## 2016-09-13 ENCOUNTER — Telehealth: Payer: Self-pay | Admitting: Physician Assistant

## 2016-09-13 NOTE — Telephone Encounter (Signed)
Pt notified of results Verbalizes understanding 

## 2016-10-06 ENCOUNTER — Other Ambulatory Visit: Payer: Self-pay | Admitting: Nurse Practitioner

## 2016-10-06 DIAGNOSIS — I739 Peripheral vascular disease, unspecified: Secondary | ICD-10-CM

## 2017-01-10 ENCOUNTER — Other Ambulatory Visit: Payer: Self-pay | Admitting: Nurse Practitioner

## 2017-01-10 DIAGNOSIS — I739 Peripheral vascular disease, unspecified: Secondary | ICD-10-CM

## 2017-03-12 ENCOUNTER — Ambulatory Visit: Payer: Medicare Other | Admitting: Physician Assistant

## 2017-03-19 ENCOUNTER — Ambulatory Visit: Payer: Medicare Other | Admitting: Physician Assistant

## 2017-04-04 ENCOUNTER — Ambulatory Visit: Payer: Medicare Other | Admitting: Physician Assistant

## 2017-04-21 ENCOUNTER — Ambulatory Visit (INDEPENDENT_AMBULATORY_CARE_PROVIDER_SITE_OTHER): Payer: BLUE CROSS/BLUE SHIELD | Admitting: Physician Assistant

## 2017-04-21 ENCOUNTER — Encounter: Payer: Self-pay | Admitting: Physician Assistant

## 2017-04-21 VITALS — BP 131/66 | HR 60 | Temp 96.7°F | Ht 70.0 in | Wt 180.6 lb

## 2017-04-21 DIAGNOSIS — Z0000 Encounter for general adult medical examination without abnormal findings: Secondary | ICD-10-CM

## 2017-04-21 DIAGNOSIS — I739 Peripheral vascular disease, unspecified: Secondary | ICD-10-CM

## 2017-04-21 DIAGNOSIS — M069 Rheumatoid arthritis, unspecified: Secondary | ICD-10-CM

## 2017-04-21 MED ORDER — ROSUVASTATIN CALCIUM 10 MG PO TABS: 10.0000 mg | ORAL_TABLET | Freq: Every day | ORAL | 3 refills | Status: DC

## 2017-04-21 MED ORDER — OXYCODONE-ACETAMINOPHEN 5-325 MG PO TABS: 1.0000 | ORAL_TABLET | Freq: Four times a day (QID) | ORAL | 0 refills | Status: DC | PRN

## 2017-04-21 MED ORDER — METOPROLOL SUCCINATE ER 25 MG PO TB24: 25.0000 mg | ORAL_TABLET | Freq: Every day | ORAL | 3 refills | Status: DC

## 2017-04-21 NOTE — Progress Notes (Signed)
BP 131/66   Pulse 60   Temp (!) 96.7 F (35.9 C) (Oral)   Ht 5' 10"  (1.778 m)   Wt 180 lb 9.6 oz (81.9 kg)   BMI 25.91 kg/m    Subjective:    Patient ID: Darryl Ward, male    DOB: March 03, 1945, 72 y.o.   MRN: [id number]  HPI: Darryl Ward is a 72 y.o. male presenting on 04/21/2017 for Follow-up (6 month )  This patient comes in for periodic recheck on medications and conditions including hypertension, rheumatoid arth.  He reports overall he is doing well.  He does need refills on his medication.  He is able to make 90 of Percocet last for 6 months ritis with chronic pain in we will send this into his pharmacy.  He needs refills on his other medications he also needs labs performed today..   All medications are reviewed today. There are no reports of any problems with the medications. All of the medical conditions are reviewed and updated.  Lab work is reviewed and will be ordered as medically necessary. There are no new problems reported with today's visit.   Past Medical History:  Diagnosis Date  . AAA (abdominal aortic aneurysm) ([place])   . Carotid artery disease (HCC)    Nonobstructive  . Cataract   . Coronary atherosclerosis of native coronary artery    PTCA small diagonal 2007 otherwise nonobstructive CAD, EF 50-55%  . Essential hypertension, benign   . Hyperlipidemia   . NSTEMI (non-ST elevated myocardial infarction) ([place]) 2007  . Stroke [facility]) 2004  . TIA (transient ischemic attack) 2006   Relevant past medical, surgical, family and social history reviewed and updated as indicated. Interim medical history since our last visit reviewed. Allergies and medications reviewed and updated. DATA REVIEWED: CHART IN EPIC  Family History reviewed for pertinent findings.  Review of Systems  Constitutional: Negative.  Negative for appetite change and fatigue.  HENT: Negative.   Eyes: Negative.  Negative for pain and visual disturbance.  Respiratory: Negative.  Negative for  cough, chest tightness, shortness of breath and wheezing.   Cardiovascular: Negative.  Negative for chest pain, palpitations and leg swelling.  Gastrointestinal: Negative.  Negative for abdominal pain, diarrhea, nausea and vomiting.  Endocrine: Negative.   Genitourinary: Negative.   Musculoskeletal: Positive for arthralgias, back pain and joint swelling.  Skin: Negative.  Negative for color change and rash.  Neurological: Negative.  Negative for weakness, numbness and headaches.  Psychiatric/Behavioral: Negative.     Allergies as of 04/21/2017   No Known Allergies     Medication List        Accurate as of 04/21/17  9:58 AM. Always use your most recent med list.          aspirin 325 MG tablet Take 325 mg by mouth daily.   metoprolol succinate 25 MG 24 hr tablet Commonly known as:  TOPROL-XL Take 1 tablet (25 mg total) by mouth daily.   oxyCODONE-acetaminophen 5-325 MG tablet Commonly known as:  PERCOCET/ROXICET Take 1 tablet by mouth every 6 (six) hours as needed. For pain   rosuvastatin 10 MG tablet Commonly known as:  CRESTOR Take 1 tablet (10 mg total) by mouth daily.          Objective:    BP 131/66   Pulse 60   Temp (!) 96.7 F (35.9 C) (Oral)   Ht 5' 10"  (1.778 m)   Wt 180 lb 9.6 oz (81.9 kg)  BMI 25.91 kg/m   No Known Allergies  Wt Readings from Last 3 Encounters:  04/21/17 180 lb 9.6 oz (81.9 kg)  09/09/16 174 lb (78.9 kg)  06/07/13 189 lb (85.7 kg)    Physical Exam  Constitutional: He appears well-developed and well-nourished. No distress.  HENT:  Head: Normocephalic and atraumatic.  Eyes: Pupils are equal, round, and reactive to light. Conjunctivae and EOM are normal.  Cardiovascular: Normal rate, regular rhythm and normal heart sounds.  Pulmonary/Chest: Effort normal and breath sounds normal. No respiratory distress.  Skin: Skin is warm and dry.  Psychiatric: He has a normal mood and affect. His behavior is normal.  Nursing note and vitals  reviewed.   Results for orders placed or performed in visit on 09/09/16  CMP14+EGFR  Result Value Ref Range   Glucose 79 65 - 99 mg/dL   BUN 11 8 - 27 mg/dL   Creatinine, Ser 0.80 0.76 - 1.27 mg/dL   GFR calc non Af Amer 91 >59 mL/min/1.73   GFR calc Af Amer 105 >59 mL/min/1.73   BUN/Creatinine Ratio 14 10 - 24   Sodium 140 134 - 144 mmol/L   Potassium 4.4 3.5 - 5.2 mmol/L   Chloride 104 96 - 106 mmol/L   CO2 20 20 - 29 mmol/L   Calcium 9.3 8.6 - 10.2 mg/dL   Total Protein 7.0 6.0 - 8.5 g/dL   Albumin 4.2 3.6 - 4.8 g/dL   Globulin, Total 2.8 1.5 - 4.5 g/dL   Albumin/Globulin Ratio 1.5 1.2 - 2.2   Bilirubin Total 0.5 0.0 - 1.2 mg/dL   Alkaline Phosphatase 61 39 - 117 IU/L   AST 15 0 - 40 IU/L   ALT 8 0 - 44 IU/L  Lipid panel  Result Value Ref Range   Cholesterol, Total 203 (H) 100 - 199 mg/dL   Triglycerides 93 0 - 149 mg/dL   HDL 37 (L) >39 mg/dL   VLDL Cholesterol Cal 19 5 - 40 mg/dL   LDL Calculated 147 (H) 0 - 99 mg/dL   Chol/HDL Ratio 5.5 (H) 0.0 - 5.0 ratio      Assessment & Plan:   1. Peripheral vascular disease, unspecified (HCC) - metoprolol succinate (TOPROL-XL) 25 MG 24 hr tablet; Take 1 tablet (25 mg total) by mouth daily.  Dispense: 90 tablet; Refill: 3 - rosuvastatin (CRESTOR) 10 MG tablet; Take 1 tablet (10 mg total) by mouth daily.  Dispense: 90 tablet; Refill: 3  2. Rheumatoid arthritis involving multiple sites, unspecified rheumatoid factor presence (HCC) - oxyCODONE-acetaminophen (PERCOCET/ROXICET) 5-325 MG tablet; Take 1 tablet by mouth every 6 (six) hours as needed. For pain  Dispense: 90 tablet; Refill: 0  3. Well adult exam - CBC with Differential/Platelet - CMP14+EGFR - Lipid panel - PSA   Continue all other maintenance medications as listed above.  Follow up plan: Return in about 6 months (around 10/21/2017).  Educational handout given for [place] PA-[facility] [place] [address]  [place],  [place] [address] [phone]   04/21/2017, 9:58 AM

## 2017-04-22 ENCOUNTER — Other Ambulatory Visit: Payer: Self-pay | Admitting: *Deleted

## 2017-04-22 DIAGNOSIS — D72829 Elevated white blood cell count, unspecified: Secondary | ICD-10-CM

## 2017-04-22 LAB — PSA: Prostate Specific Ag, Serum: 1 ng/mL (ref 0.0–4.0)

## 2017-04-22 LAB — CMP14+EGFR
ALT: 10 IU/L (ref 0–44)
AST: 13 IU/L (ref 0–40)
Albumin/Globulin Ratio: 1.3 (ref 1.2–2.2)
Albumin: 3.9 g/dL (ref 3.5–4.8)
Alkaline Phosphatase: 70 IU/L (ref 39–117)
BUN/Creatinine Ratio: 16 (ref 10–24)
BUN: 13 mg/dL (ref 8–27)
Bilirubin Total: 0.2 mg/dL (ref 0.0–1.2)
CO2: 18 mmol/L — ABNORMAL LOW (ref 20–29)
Calcium: 9.1 mg/dL (ref 8.6–10.2)
Chloride: 106 mmol/L (ref 96–106)
Creatinine, Ser: 0.83 mg/dL (ref 0.76–1.27)
GFR calc Af Amer: 103 mL/min/{1.73_m2} (ref >59)
GFR calc non Af Amer: 89 mL/min/{1.73_m2} (ref >59)
Globulin, Total: 2.9 g/dL (ref 1.5–4.5)
Glucose: 87 mg/dL (ref 65–99)
Potassium: 4.9 mmol/L (ref 3.5–5.2)
Sodium: 140 mmol/L (ref 134–144)
Total Protein: 6.8 g/dL (ref 6.0–8.5)

## 2017-04-22 LAB — CBC WITH DIFFERENTIAL/PLATELET
Basophils Absolute: 0.1 10*3/uL (ref 0.0–0.2)
Basos: 1 %
EOS (ABSOLUTE): 0.3 10*3/uL (ref 0.0–0.4)
Eos: 2 %
Hematocrit: 40.5 % (ref 37.5–51.0)
Hemoglobin: 13.9 g/dL (ref 13.0–17.7)
Immature Grans (Abs): 0 10*3/uL (ref 0.0–0.1)
Immature Granulocytes: 0 %
Lymphocytes Absolute: 2.5 10*3/uL (ref 0.7–3.1)
Lymphs: 21 %
MCH: 31 pg (ref 26.6–33.0)
MCHC: 34.3 g/dL (ref 31.5–35.7)
MCV: 90 fL (ref 79–97)
Monocytes Absolute: 0.7 10*3/uL (ref 0.1–0.9)
Monocytes: 6 %
Neutrophils Absolute: 8.4 10*3/uL — ABNORMAL HIGH (ref 1.4–7.0)
Neutrophils: 70 %
Platelets: 206 10*3/uL (ref 150–379)
RBC: 4.49 x10E6/uL (ref 4.14–5.80)
RDW: 14.6 % (ref 12.3–15.4)
WBC: 12 10*3/uL — ABNORMAL HIGH (ref 3.4–10.8)

## 2017-04-22 LAB — LIPID PANEL
Chol/HDL Ratio: 5.1 ratio — ABNORMAL HIGH (ref 0.0–5.0)
Cholesterol, Total: 195 mg/dL (ref 100–199)
HDL: 38 mg/dL — ABNORMAL LOW (ref >39)
LDL Calculated: 144 mg/dL — ABNORMAL HIGH (ref 0–99)
Triglycerides: 63 mg/dL (ref 0–149)
VLDL Cholesterol Cal: 13 mg/dL (ref 5–40)

## 2017-04-24 ENCOUNTER — Telehealth: Payer: Self-pay

## 2017-04-24 NOTE — Telephone Encounter (Signed)
Insurance denied prior auth for Oxycodone acetaminophen  Over 7 day supply not covered

## 2017-04-24 NOTE — Telephone Encounter (Signed)
Is there a preferred med?

## 2017-05-05 ENCOUNTER — Encounter: Payer: Self-pay | Admitting: *Deleted

## 2017-10-21 ENCOUNTER — Ambulatory Visit: Payer: Medicare Other | Admitting: Physician Assistant

## 2017-10-23 ENCOUNTER — Other Ambulatory Visit: Payer: Self-pay | Admitting: Nurse Practitioner

## 2017-10-23 DIAGNOSIS — I739 Peripheral vascular disease, unspecified: Secondary | ICD-10-CM

## 2017-10-31 ENCOUNTER — Ambulatory Visit: Payer: Medicare Other | Admitting: Physician Assistant

## 2017-11-24 ENCOUNTER — Ambulatory Visit: Payer: Medicare Other | Admitting: Physician Assistant

## 2017-12-02 ENCOUNTER — Ambulatory Visit (INDEPENDENT_AMBULATORY_CARE_PROVIDER_SITE_OTHER): Payer: BLUE CROSS/BLUE SHIELD | Admitting: Nurse Practitioner

## 2017-12-02 ENCOUNTER — Encounter: Payer: Self-pay | Admitting: Nurse Practitioner

## 2017-12-02 VITALS — BP 130/62 | HR 62 | Temp 97.7°F | Ht 70.0 in | Wt 180.0 lb

## 2017-12-02 DIAGNOSIS — I739 Peripheral vascular disease, unspecified: Secondary | ICD-10-CM

## 2017-12-02 DIAGNOSIS — I7025 Atherosclerosis of native arteries of other extremities with ulceration: Secondary | ICD-10-CM

## 2017-12-02 DIAGNOSIS — M545 Low back pain, unspecified: Secondary | ICD-10-CM

## 2017-12-02 DIAGNOSIS — I2510 Atherosclerotic heart disease of native coronary artery without angina pectoris: Secondary | ICD-10-CM

## 2017-12-02 DIAGNOSIS — E782 Mixed hyperlipidemia: Secondary | ICD-10-CM | POA: Diagnosis not present

## 2017-12-02 DIAGNOSIS — M069 Rheumatoid arthritis, unspecified: Secondary | ICD-10-CM

## 2017-12-02 DIAGNOSIS — G8929 Other chronic pain: Secondary | ICD-10-CM | POA: Diagnosis not present

## 2017-12-02 MED ORDER — METOPROLOL SUCCINATE ER 25 MG PO TB24: 25.0000 mg | ORAL_TABLET | Freq: Every day | ORAL | 1 refills | Status: DC

## 2017-12-02 MED ORDER — ROSUVASTATIN CALCIUM 10 MG PO TABS: 10.0000 mg | ORAL_TABLET | Freq: Every day | ORAL | 3 refills | Status: DC

## 2017-12-02 MED ORDER — OXYCODONE-ACETAMINOPHEN 5-325 MG PO TABS: 1.0000 | ORAL_TABLET | Freq: Four times a day (QID) | ORAL | 0 refills | Status: DC | PRN

## 2017-12-02 NOTE — Progress Notes (Signed)
Subjective:    Patient ID: Darryl Ward, male    DOB: 05/12/1945, 72 y.o.   MRN: [id number]   Chief Complaint: Medical Management of Chronic Issues   HPI:  1. PVD (peripheral vascular disease) ([place])  Worse in bil lower ext.  2. Atherosclerosis of native coronary artery of native heart without angina pectoris Was seen on chest xray and we will keep watch of it.   3. Mixed hyperlipidemia  Doesnot watch diet and does very little exercise. Takes crestor daily  4. Atherosclerosis of native arteries of the extremities with ulceration ([place]) Currently doing well. No new ulcers or lesion of lower ext.   5. Chronic midline low back pain without sciatica  Pain assessment: Cause of pain- low back pain- unsure of cause Pain location- lower back Pain on scale of 1-10- 1-2/10 currently Frequency- 2-3 x a week What increases pain-to much activity What makes pain Better-rest Effects on ADL - gets done what he needs to get done Any change in general medical condition-none  Current medications- oxycodone 5/325 prn Effectiveness of current meds-make pain tolerable Adverse reactions form pain meds-none Morphine equivalent- 30- but odes not take everyday  Pill count performed-No Urine drug screen- No Was the [facility] reviewed- yes  If yes were their any concerning findings? - none  Pain contract signed on: 12/02/17     Outpatient Encounter Medications as of 12/02/2017  Medication Sig  . aspirin 325 MG tablet Take 325 mg by mouth daily.  . metoprolol succinate (TOPROL-XL) 25 MG 24 hr tablet TAKE 1 TABLET BY MOUTH EVERY DAY  . rosuvastatin (CRESTOR) 10 MG tablet Take 1 tablet (10 mg total) by mouth daily.   No facility-administered encounter medications on file as of 12/02/2017.       New complaints: None today   Social history: Still works 12 hour shifts at [employer]   Review of Systems  Constitutional: Negative for activity change and appetite change.  HENT:  Negative.   Eyes: Negative for pain.  Respiratory: Negative for shortness of breath.   Cardiovascular: Negative for chest pain, palpitations and leg swelling.  Gastrointestinal: Negative for abdominal pain.  Endocrine: Negative for polydipsia.  Genitourinary: Negative.   Skin: Negative for rash.  Neurological: Negative for dizziness, weakness and headaches.  Hematological: Does not bruise/bleed easily.  Psychiatric/Behavioral: Negative.   All other systems reviewed and are negative.      Objective:   Physical Exam  Constitutional: He is oriented to person, place, and time. He appears well-developed and well-nourished.  HENT:  Head: Normocephalic.  Nose: Nose normal.  Mouth/Throat: Oropharynx is clear and moist.  Eyes: Pupils are equal, round, and reactive to light. EOM are normal.  Neck: Normal range of motion and phonation normal. Neck supple. No JVD present. Carotid bruit is not present. No thyroid mass and no thyromegaly present.  Cardiovascular: Normal rate and regular rhythm.  Pulmonary/Chest: Effort normal and breath sounds normal. No respiratory distress.  Abdominal: Soft. Normal appearance, normal aorta and bowel sounds are normal. There is no tenderness.  Musculoskeletal: Normal range of motion.  FROM of lumbar spine with pain on full flexion (-) SLR bil  Lymphadenopathy:    He has no cervical adenopathy.  Neurological: He is alert and oriented to person, place, and time. He displays normal reflexes.  Skin: Skin is warm and dry.  Psychiatric: He has a normal mood and affect. His behavior is normal. Judgment and thought content normal.  Nursing note and vitals reviewed.  BP 130/62   Pulse 62   Temp 97.7 F (36.5 C) (Oral)   Ht 5' 10"  (1.778 m)   Wt 180 lb (81.6 kg)   BMI 25.83 kg/m        Assessment & Plan:  Darryl Ward comes in today with chief complaint of Medical Management of Chronic Issues   Diagnosis and orders addressed:  1. PVD (peripheral  vascular disease) (HCC) exercise  2. Atherosclerosis of native coronary artery of native heart without angina pectoris  3. Mixed hyperlipidemia Low fat diet - CMP14+EGFR - Lipid panel  4. Atherosclerosis of native arteries of the extremities with ulceration ([place])  5. Chronic midline low back pain without sciatica Back stretches Rest No heavy  lifting - oxyCODONE-acetaminophen (PERCOCET/ROXICET) 5-325 MG tablet; Take 1 tablet by mouth every 6 (six) hours as needed. For pain  Dispense: 90 tablet; Refill: 0  6. Peripheral vascular disease, unspecified ([place]) Exercise to keep circulation  - metoprolol succinate (TOPROL-XL) 25 MG 24 hr tablet; Take 1 tablet (25 mg total) by mouth daily.  Dispense: 90 tablet; Refill: 1 - rosuvastatin (CRESTOR) 10 MG tablet; Take 1 tablet (10 mg total) by mouth daily.  Dispense: 90 tablet; Refill: 3   Labs pending Health Maintenance reviewed Diet and exercise encouraged  Follow up plan: 3 months   Mary-Margaret Hassell Done, FNP

## 2017-12-02 NOTE — Patient Instructions (Signed)
Opioid Pain Medicine Information Opioids are powerful medicines that are used to treat moderate to severe pain. Opioids should be taken with the supervision of a trained health care provider. They should be taken for the shortest period of time as possible. This is because opioids can be addictive and the longer you take opioids, the greater your risk of addiction (opioid use disorder). What do opioids do? Opioids help to reduce or eliminate pain. When used for short periods of time, they can help you:  Sleep better.  Do better in physical or occupational therapy.  Feel better in the first few days after an injury.  Recover from surgery.  What is a pain treatment plan? A pain treatment plan is an agreement between you and your health care provider. Pain is unique to each person, and treatments vary depending on your condition. To manage your pain successfully, you and your health care provider need to understand each other and work together. To help you do this:  Discuss the goals of your treatment, including how much pain you might expect to have and how you will manage the pain.  Review the risks and benefits of taking opioid medicines for your condition.  Remember that a good treatment plan uses more than one approach and minimizes the chance of side effects.  Be honest about the amount of medicines you take, and about any drug or alcohol use.  Get pain medicine prescriptions from only one care provider.  Keep all follow-up visits as told by your health care provider. This is important.  What instructions should I follow while taking opioid pain medicine? While you are taking the medicine and for 8 hours after you stop taking the medicine, follow these instructions:  Do not drive.  Do not use machinery or power tools.  Do not sign legal documents.  Do not drink alcohol.  Do not take sleeping pills.  Do not supervise children by yourself.  Do not participate in activities  that require climbing or being in high places.  Do not enter a body of water-such as a lake, river, ocean, spa, or swimming pool-unless an adult is nearby who can monitor and help you.  What kinds of side effects can opioids cause? Opioids can cause side effects, such as:  Constipation.  Nausea.  Vomiting.  Drowsiness.  Confusion.  Opioid use disorder.  Breathing difficulties (respiratory depression).  Using opioid pain medicines for longer than 3 days increases your risk of these side effects. Taking opioid pain medicine for a long period of time can affect your ability to do daily tasks. It also puts you at risk for:  Motor vehicle accidents.  Depression.  Suicide.  Heart attack.  Overdose, which can sometimes lead to death.  What are alternative ways to manage pain? Pain can be managed with many types of alternative treatments. Ask your health care provider to refer you to one or more specialists who can help you manage pain through:  Physical or occupational therapy.  Counseling (cognitive behavioral therapy).  Good nutrition.  Biofeedback.  Massage.  Meditation.  Non-opioid medicine.  Following a gentle exercise program.  How can I keep others safe while I am taking opioid pain medicine?  Keep pain medicine in a locked cabinet, or in a secure area where children cannot reach it.  Never share your pain medicine with anyone.  Do not save any leftover pills. If you have leftover medicine, you can: 1. Bring the medicine to a prescription take-back program.   This is usually offered by the county or law enforcement. 2. Throw it out in the trash. To do this:  Mix the medicine with undesirable trash such as pet waste or food.  Put the mixture in a sealed container or plastic bag.  Throw it in the trash.  Destroy any personal information on the prescription bottle. How do I stop taking opioids if I have been taking them for a long time? If you have  been taking opioid medicine for more than a few weeks, you may need to slowly decrease (taper) how much you take until you stop completely. Tapering your use of opioids can decrease your chances of experiencing withdrawal symptoms, such as:  Pain and cramping in the abdomen.  Nausea.  Sweating.  Sleepiness.  Restlessness.  Uncontrollable shaking (tremors).  Cravings for the medicine.  Do not attempt to taper your use of opioids on your own. Talk with your health care provider about how to do this. Your health care provider may prescribe a step-down schedule based on how much medicine you are taking and how long you have been taking it. Where to find support: If you have been taking opioids for a long time, you may benefit from receiving support for quitting from a local support group or counselor. Ask your health care provider for a referral to these resources in your area. Where to find more information:  Centers for Disease Control and Prevention (CDC): www.cdc.gov/drugoverdose/opioids/index.html Get help right away if: Seek medical care right away if you are taking opioids and you (or people close to you) notice any of the following:  Difficulty breathing.  Breathing that is slower or more shallow than normal.  A very slow heartbeat (pulse).  Severe confusion.  Unconsciousness.  Sleepiness.  Slurred speech.  Nausea and vomiting.  Cold, clammy skin.  Blue lips or fingernails.  Limpness.  Abnormally small pupils.  If you think that you or someone else may have taken too much of an opioid medicine, get medical help right away. Do not wait to see if the symptoms go away on their own.  If you ever feel like you may hurt yourself or others, or have thoughts about taking your own life, get help right away. You can go to your nearest emergency department or call:  Your local emergency services (911 in the [place]  The hotline of the National Poison Control Center  (1-800-222-1222 in the [place]  A suicide crisis helpline, such as the National Suicide Prevention Lifeline at 1-800-273-8255. This is open 24 hours a day.  Summary  Opioid medicines can help you manage moderate to severe pain for a short period of time.  Discuss the goals of your treatment with your health care provider, including how much pain you might expect to have and how you will manage the pain.  A good treatment plan uses more than one approach. Pain can be managed with many types of alternative treatments.  If you think that you or someone else may have taken too much of an opioid, get medical help right away. This information is not intended to replace advice given to you by your health care provider. Make sure you discuss any questions you have with your health care provider. Document Released: 02/03/2015 Document Revised: 04/26/2016 Document Reviewed: 08/19/2014 Elsevier Interactive Patient Education  2018 Elsevier Inc.  

## 2017-12-03 LAB — CMP14+EGFR
ALT: 8 IU/L (ref 0–44)
AST: 17 IU/L (ref 0–40)
Albumin/Globulin Ratio: 1.4 (ref 1.2–2.2)
Albumin: 3.7 g/dL (ref 3.5–4.8)
Alkaline Phosphatase: 66 IU/L (ref 39–117)
BUN/Creatinine Ratio: 11 (ref 10–24)
BUN: 11 mg/dL (ref 8–27)
Bilirubin Total: 0.2 mg/dL (ref 0.0–1.2)
CO2: 20 mmol/L (ref 20–29)
Calcium: 9.2 mg/dL (ref 8.6–10.2)
Chloride: 107 mmol/L — ABNORMAL HIGH (ref 96–106)
Creatinine, Ser: 1.04 mg/dL (ref 0.76–1.27)
GFR calc Af Amer: 84 mL/min/{1.73_m2} (ref >59)
GFR calc non Af Amer: 72 mL/min/{1.73_m2} (ref >59)
Globulin, Total: 2.7 g/dL (ref 1.5–4.5)
Glucose: 79 mg/dL (ref 65–99)
Potassium: 4.7 mmol/L (ref 3.5–5.2)
Sodium: 143 mmol/L (ref 134–144)
Total Protein: 6.4 g/dL (ref 6.0–8.5)

## 2017-12-03 LAB — LIPID PANEL
Chol/HDL Ratio: 5.3 ratio — ABNORMAL HIGH (ref 0.0–5.0)
Cholesterol, Total: 189 mg/dL (ref 100–199)
HDL: 36 mg/dL — ABNORMAL LOW (ref >39)
LDL Calculated: 136 mg/dL — ABNORMAL HIGH (ref 0–99)
Triglycerides: 85 mg/dL (ref 0–149)
VLDL Cholesterol Cal: 17 mg/dL (ref 5–40)

## 2017-12-09 ENCOUNTER — Telehealth: Payer: Self-pay

## 2017-12-09 NOTE — Telephone Encounter (Signed)
Insurance denied Oxycodone Acetaminphen

## 2017-12-09 NOTE — Telephone Encounter (Signed)
I think it was a problem with [facility] on their end in regards to my DEA number- have him call [facility] and ask them to rerun it.

## 2017-12-09 NOTE — Telephone Encounter (Signed)
Attempted to contact patient - NA °

## 2018-06-10 ENCOUNTER — Encounter: Payer: Self-pay | Admitting: Nurse Practitioner

## 2018-06-10 ENCOUNTER — Ambulatory Visit (INDEPENDENT_AMBULATORY_CARE_PROVIDER_SITE_OTHER): Payer: Medicare Other | Admitting: Nurse Practitioner

## 2018-06-10 ENCOUNTER — Other Ambulatory Visit: Payer: Self-pay

## 2018-06-10 DIAGNOSIS — I2510 Atherosclerotic heart disease of native coronary artery without angina pectoris: Secondary | ICD-10-CM | POA: Diagnosis not present

## 2018-06-10 DIAGNOSIS — I739 Peripheral vascular disease, unspecified: Secondary | ICD-10-CM

## 2018-06-10 DIAGNOSIS — M069 Rheumatoid arthritis, unspecified: Secondary | ICD-10-CM | POA: Diagnosis not present

## 2018-06-10 DIAGNOSIS — E782 Mixed hyperlipidemia: Secondary | ICD-10-CM | POA: Diagnosis not present

## 2018-06-10 MED ORDER — OXYCODONE-ACETAMINOPHEN 5-325 MG PO TABS: 1.0000 | ORAL_TABLET | Freq: Four times a day (QID) | ORAL | 0 refills | Status: AC | PRN

## 2018-06-10 MED ORDER — METOPROLOL SUCCINATE ER 25 MG PO TB24: 25.0000 mg | ORAL_TABLET | Freq: Every day | ORAL | 1 refills | Status: DC

## 2018-06-10 MED ORDER — ROSUVASTATIN CALCIUM 10 MG PO TABS: 10.0000 mg | ORAL_TABLET | Freq: Every day | ORAL | 3 refills | Status: DC

## 2018-06-10 NOTE — Progress Notes (Signed)
Patient ID: Darryl Ward, male   DOB: 05-25-1945, 73 y.o.   MRN: [id number]    Virtual Visit via telephone Note  I connected with KORBAN SHEARER on 06/10/18 at 9:50 AM by telephone and verified that I am speaking with the correct person using two identifiers. Kelby Aline Barcelo is currently located at home and no one is currently with her during visit. The provider, Mary-Margaret Hassell Done, FNP is located in their office at time of visit.  I discussed the limitations, risks, security and privacy concerns of performing an evaluation and management service by telephone and the availability of in person appointments. I also discussed with the patient that there may be a patient responsible charge related to this service. The patient expressed understanding and agreed to proceed.   History and Present Illness:   Chief Complaint: Medical Management of Chronic Issues    HPI:  1. Mixed hyperlipidemia Does not watch diet and does very little exercise  2. Atherosclerosis of native coronary artery of native heart without angina pectoris Taking metoprolol daily and has  n o complaints.    Outpatient Encounter Medications as of 06/10/2018  Medication Sig  . aspirin 325 MG tablet Take 325 mg by mouth daily.  . metoprolol succinate (TOPROL-XL) 25 MG 24 hr tablet Take 1 tablet (25 mg total) by mouth daily.  . rosuvastatin (CRESTOR) 10 MG tablet Take 1 tablet (10 mg total) by mouth daily.       New complaints: Patient is stating that he needs pain pills for his intermittent back pain. He only takes 1x a week. If he gets to active his back pain will increase and he will take a pill. He says this is coming from rheumatoid arthritis Last refill was in november  Social history: Lives by hisself. He has been out of work since march. He says he is ready to go back.      Review of Systems  Constitutional: Negative for diaphoresis and weight loss.  Eyes: Negative for blurred vision, double vision  and pain.  Respiratory: Negative for shortness of breath.   Cardiovascular: Negative for chest pain, palpitations, orthopnea and leg swelling.  Gastrointestinal: Negative for abdominal pain.  Skin: Negative for rash.  Neurological: Negative for dizziness, sensory change, loss of consciousness, weakness and headaches.  Endo/Heme/Allergies: Negative for polydipsia. Does not bruise/bleed easily.  Psychiatric/Behavioral: Negative for memory loss. The patient does not have insomnia.   All other systems reviewed and are negative.    Observations/Objective: Alert and oriented No distress  Assessment and Plan: EATON FOLMAR comes in today with chief complaint of Medical Management of Chronic Issues   Diagnosis and orders addressed:  1. Mixed hyperlipidemia Low fat diet  2. Atherosclerosis of native coronary artery of native heart without angina pectoris Needs to follow up with cardiology in the coming months  3. Rheumatoid arthritis involving multiple sites, unspecified rheumatoid factor presence (HCC) - oxyCODONE-acetaminophen (PERCOCET/ROXICET) 5-325 MG tablet; Take 1 tablet by mouth every 6 (six) hours as needed. For pain  Dispense: 90 tablet; Refill: 0  4. Peripheral vascular disease, unspecified (HCC) - metoprolol succinate (TOPROL-XL) 25 MG 24 hr tablet; Take 1 tablet (25 mg total) by mouth daily.  Dispense: 90 tablet; Refill: 1 - rosuvastatin (CRESTOR) 10 MG tablet; Take 1 tablet (10 mg total) by mouth daily.  Dispense: 90 tablet; Refill: 3   Previous labs reviewed Health Maintenance reviewed Diet and exercise encouraged  Follow up plan: 3 months  I discussed the assessment and treatment plan with the patient. The patient was provided an opportunity to ask questions and all were answered. The patient agreed with the plan and demonstrated an understanding of the instructions.   The patient was advised to call back or seek an in-person evaluation if the symptoms  worsen or if the condition fails to improve as anticipated.  The above assessment and management plan was discussed with the patient. The patient verbalized understanding of and has agreed to the management plan. Patient is aware to call the clinic if symptoms persist or worsen. Patient is aware when to return to the clinic for a follow-up visit. Patient educated on when it is appropriate to go to the emergency department.   Time call ended:  10:05 AM  I provided 15 minutes of non-face-to-face time during this encounter.    Mary-Margaret Hassell Done, FNP

## 2018-07-27 ENCOUNTER — Other Ambulatory Visit: Payer: Self-pay | Admitting: Nurse Practitioner

## 2018-07-28 NOTE — Telephone Encounter (Signed)
LMOVM that pt has a pain contract signed, refills must be done during a visit, he has one scheduled on 09/18/18, if he needs sooner he needs to schedule an earlier appt

## 2018-09-18 ENCOUNTER — Encounter: Payer: Medicare Other | Admitting: Nurse Practitioner

## 2018-09-18 ENCOUNTER — Encounter: Payer: Self-pay | Admitting: Nurse Practitioner

## 2018-09-18 NOTE — Progress Notes (Signed)
Patient ID: Darryl Ward, male   DOB: 06/02/1945, 73 y.o.   MRN: [id number]  Several attempts were made to contact patient . Messages were left on his voice mail. Was told on last attempt at 2:40 that he would have to reschedule

## 2018-10-27 ENCOUNTER — Other Ambulatory Visit: Payer: Self-pay

## 2018-10-27 ENCOUNTER — Ambulatory Visit (INDEPENDENT_AMBULATORY_CARE_PROVIDER_SITE_OTHER): Payer: Medicare Other

## 2018-10-27 ENCOUNTER — Ambulatory Visit (INDEPENDENT_AMBULATORY_CARE_PROVIDER_SITE_OTHER): Payer: Medicare Other | Admitting: Nurse Practitioner

## 2018-10-27 ENCOUNTER — Encounter: Payer: Self-pay | Admitting: Nurse Practitioner

## 2018-10-27 VITALS — BP 134/68 | HR 62 | Temp 97.3°F | Resp 18 | Ht 70.0 in | Wt 178.0 lb

## 2018-10-27 DIAGNOSIS — F17200 Nicotine dependence, unspecified, uncomplicated: Secondary | ICD-10-CM | POA: Diagnosis not present

## 2018-10-27 DIAGNOSIS — I714 Abdominal aortic aneurysm, without rupture, unspecified: Secondary | ICD-10-CM

## 2018-10-27 DIAGNOSIS — I739 Peripheral vascular disease, unspecified: Secondary | ICD-10-CM | POA: Diagnosis not present

## 2018-10-27 DIAGNOSIS — E782 Mixed hyperlipidemia: Secondary | ICD-10-CM | POA: Diagnosis not present

## 2018-10-27 DIAGNOSIS — I2510 Atherosclerotic heart disease of native coronary artery without angina pectoris: Secondary | ICD-10-CM | POA: Diagnosis not present

## 2018-10-27 MED ORDER — ROSUVASTATIN CALCIUM 10 MG PO TABS: 10.0000 mg | ORAL_TABLET | Freq: Every day | ORAL | 3 refills | Status: DC

## 2018-10-27 MED ORDER — CELECOXIB 200 MG PO CAPS: 200.0000 mg | ORAL_CAPSULE | Freq: Two times a day (BID) | ORAL | 2 refills | Status: DC

## 2018-10-27 MED ORDER — METOPROLOL SUCCINATE ER 25 MG PO TB24: 25.0000 mg | ORAL_TABLET | Freq: Every day | ORAL | 1 refills | Status: DC

## 2018-10-27 NOTE — Addendum Note (Signed)
Addended by: [name] on: 10/27/2018 02:52 PM   Modules accepted: Orders

## 2018-10-27 NOTE — Progress Notes (Addendum)
Subjective:    Patient ID: Darryl Ward, male    DOB: 1945/11/03, 73 y.o.   MRN: [id number]   Chief Complaint: Medical Management of Chronic Issues    HPI:  1. Mixed hyperlipidemia Does not watch diet and does very little exercise. He is very active though. Lab Results  Component Value Date   CHOL 189 12/02/2017   HDL 36 (L) 12/02/2017   LDLCALC 136 (H) 12/02/2017   TRIG 85 12/02/2017   CHOLHDL 5.3 (H) 12/02/2017     2. Atherosclerosis of native coronary artery of native heart without angina pectoris Was seen on chest xray. He has not ever seen cardiology and doe snot want to.  3. PVD (peripheral vascular disease) ([place]) He has not seen vascular surgeon since 2015  4. 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 Has not seen vascular surgeon sinc ehe was released. Denies any pain or numbness of lower ext.  5. smoker 1/2 -1 pack a day. Has no desire to quit  Outpatient Encounter Medications as of 10/27/2018  Medication Sig  . aspirin 325 MG tablet Take 325 mg by mouth daily.  . metoprolol succinate (TOPROL-XL) 25 MG 24 hr tablet Take 1 tablet (25 mg total) by mouth daily.  . rosuvastatin (CRESTOR) 10 MG tablet Take 1 tablet (10 mg total) by mouth daily.     Past Surgical History:  Procedure Laterality Date  . AORTA - BILATERAL FEMORAL ARTERY BYPASS GRAFT  01/08/2012   Procedure: AORTA BIFEMORAL BYPASS GRAFT;  Surgeon: Elam Dutch, MD;  Location: [facility] OR;  Service: Vascular;  Laterality: Bilateral;  using 18x51m x 40cm Hemashield Gold Vascular Graft with Endarterectomy, Thombectomy and  Reimplantation of Inferior Mesenteric Artery  . Left cataract surgery      Family History  Problem Relation Age of Onset  . Hyperlipidemia Sister   . Heart attack Brother     New complaints: bil hand pain with swelling joints.  Social history: Works at [employer], music n/a    Review of Systems  Constitutional: Negative for  activity change and appetite change.  HENT: Negative.   Eyes: Negative for pain.  Respiratory: Negative for shortness of breath.   Cardiovascular: Negative for chest pain, palpitations and leg swelling.  Gastrointestinal: Negative for abdominal pain.  Endocrine: Negative for polydipsia.  Genitourinary: Negative.   Musculoskeletal: Positive for arthralgias (bil hands).  Skin: Negative for rash.  Neurological: Negative for dizziness, weakness and headaches.  Hematological: Does not bruise/bleed easily.  Psychiatric/Behavioral: Negative.   All other systems reviewed and are negative.      Objective:   Physical Exam Vitals signs and nursing note reviewed.  Constitutional:      Appearance: Normal appearance. He is well-developed.  HENT:     Head: Normocephalic.     Nose: Nose normal.  Eyes:     Pupils: Pupils are equal, round, and reactive to light.  Neck:     Musculoskeletal: Normal range of motion and neck supple.     Thyroid: No thyroid mass or thyromegaly.     Vascular: No carotid bruit or JVD.     Trachea: Phonation normal.  Cardiovascular:     Rate and Rhythm: Normal rate and regular rhythm.  Pulmonary:     Effort: Pulmonary effort is normal. No respiratory distress.     Breath sounds: Normal breath sounds.  Abdominal:     General: Bowel sounds are normal.     Palpations: Abdomen is soft.  Tenderness: There is no abdominal tenderness.  Musculoskeletal: Normal range of motion.     Comments: Pip joints of bil hands swollen with pain on full grip.  Lymphadenopathy:     Cervical: No cervical adenopathy.  Skin:    General: Skin is warm and dry.  Neurological:     Mental Status: He is alert and oriented to person, place, and time.  Psychiatric:        Behavior: Behavior normal.        Thought Content: Thought content normal.        Judgment: Judgment normal.     BP 134/68   Pulse 62   Temp (!) 97.3 F (36.3 C) (Temporal)   Resp 18   Ht _0  (1.778 m)   Wt  178 lb (80.7 kg)   SpO2 100%   BMI 25.54 kg/m   EKG- NSR-Mary-Margaret Hassell Done, FNP Chest xray no acute findings. Chronic bronchitic changes    Assessment & Plan:  Darryl Ward comes in today with chief complaint of Medical Management of Chronic Issues   Diagnosis and orders addressed:  1. Mixed hyperlipidemia Low fat diet - EKG 12-Lead - CMP14+EGFR - Lipid panel  2. Atherosclerosis of native coronary artery of native heart without angina pectoris  3. PVD (peripheral vascular disease) (HCC) Continue daily aspirin  4. Aneurysm of abdominal vessel (HCC)  5. Smoker Smoking cessation encouraged enrolled in [place] study for low dose CT - DG Chest 2 View; Future  6. Peripheral vascular disease, unspecified ([place]) - rosuvastatin (CRESTOR) 10 MG tablet; Take 1 tablet (10 mg total) by mouth daily.  Dispense: 90 tablet; Refill: 3 - metoprolol succinate (TOPROL-XL) 25 MG 24 hr tablet; Take 1 tablet (25 mg total) by mouth daily.  Dispense: 90 tablet; Refill: 1  7. Arthritis of hands celebrex 242m 1 po BID #60 2 refills  Labs pending Health Maintenance reviewed Diet and exercise encouraged  Follow up plan: 6 months   Mary-Margaret MHassell Done FNP

## 2018-10-28 LAB — LIPID PANEL
Chol/HDL Ratio: 3.4 ratio (ref 0.0–5.0)
Cholesterol, Total: 132 mg/dL (ref 100–199)
HDL: 39 mg/dL — ABNORMAL LOW (ref >39)
LDL Chol Calc (NIH): 75 mg/dL (ref 0–99)
Triglycerides: 93 mg/dL (ref 0–149)
VLDL Cholesterol Cal: 18 mg/dL (ref 5–40)

## 2018-10-28 LAB — CMP14+EGFR
ALT: 12 IU/L (ref 0–44)
AST: 17 IU/L (ref 0–40)
Albumin/Globulin Ratio: 1.3 (ref 1.2–2.2)
Albumin: 3.8 g/dL (ref 3.7–4.7)
Alkaline Phosphatase: 59 IU/L (ref 39–117)
BUN/Creatinine Ratio: 9 — ABNORMAL LOW (ref 10–24)
BUN: 10 mg/dL (ref 8–27)
Bilirubin Total: 0.4 mg/dL (ref 0.0–1.2)
CO2: 21 mmol/L (ref 20–29)
Calcium: 9.3 mg/dL (ref 8.6–10.2)
Chloride: 107 mmol/L — ABNORMAL HIGH (ref 96–106)
Creatinine, Ser: 1.09 mg/dL (ref 0.76–1.27)
GFR calc Af Amer: 79 mL/min/{1.73_m2} (ref >59)
GFR calc non Af Amer: 68 mL/min/{1.73_m2} (ref >59)
Globulin, Total: 3 g/dL (ref 1.5–4.5)
Glucose: 77 mg/dL (ref 65–99)
Potassium: 4.4 mmol/L (ref 3.5–5.2)
Sodium: 139 mmol/L (ref 134–144)
Total Protein: 6.8 g/dL (ref 6.0–8.5)

## 2018-10-29 ENCOUNTER — Other Ambulatory Visit (HOSPITAL_COMMUNITY): Payer: Self-pay | Admitting: *Deleted

## 2018-10-29 ENCOUNTER — Encounter (HOSPITAL_COMMUNITY): Payer: Self-pay | Admitting: *Deleted

## 2018-10-29 DIAGNOSIS — Z122 Encounter for screening for malignant neoplasm of respiratory organs: Secondary | ICD-10-CM

## 2018-10-29 DIAGNOSIS — Z87891 Personal history of nicotine dependence: Secondary | ICD-10-CM

## 2018-10-29 NOTE — Progress Notes (Signed)
Received referral for initial lung cancer screening scan.  Contacted patient and obtained smoking history (started age 73, current smoker between 1.5 pack per day, 76.5 pack year) as well as answering questions related to the screening process.  Patient denies signs/symptoms of lung cancer such as weight loss or hemoptysis.  Patient denies comorbidity that would prevent curative treatment if lung cancer were to be found.  Patient is scheduled for shared decision making visit and CT scan on 10/27 at 0950.

## 2018-10-29 NOTE — Progress Notes (Signed)
Low dose ct orders placed

## 2018-11-06 ENCOUNTER — Telehealth: Payer: Self-pay | Admitting: Nurse Practitioner

## 2018-11-16 ENCOUNTER — Other Ambulatory Visit: Payer: Self-pay

## 2018-11-17 ENCOUNTER — Ambulatory Visit (HOSPITAL_COMMUNITY)
Admission: RE | Admit: 2018-11-17 | Discharge: 2018-11-17 | Disposition: A | Discharge status: Home / Self Care | Payer: Medicare Other | Source: Ambulatory Visit | Attending: Hematology | Admitting: Hematology

## 2018-11-17 ENCOUNTER — Inpatient Hospital Stay (HOSPITAL_COMMUNITY): Payer: Medicare Other | Attending: Hematology | Admitting: Hematology

## 2018-11-17 ENCOUNTER — Encounter (HOSPITAL_COMMUNITY): Payer: Self-pay | Admitting: *Deleted

## 2018-11-17 DIAGNOSIS — I10 Essential (primary) hypertension: Secondary | ICD-10-CM | POA: Insufficient documentation

## 2018-11-17 DIAGNOSIS — Z791 Long term (current) use of non-steroidal anti-inflammatories (NSAID): Secondary | ICD-10-CM | POA: Insufficient documentation

## 2018-11-17 DIAGNOSIS — R05 Cough: Secondary | ICD-10-CM | POA: Diagnosis not present

## 2018-11-17 DIAGNOSIS — Z7982 Long term (current) use of aspirin: Secondary | ICD-10-CM | POA: Diagnosis not present

## 2018-11-17 DIAGNOSIS — Z122 Encounter for screening for malignant neoplasm of respiratory organs: Secondary | ICD-10-CM

## 2018-11-17 DIAGNOSIS — Z8249 Family history of ischemic heart disease and other diseases of the circulatory system: Secondary | ICD-10-CM | POA: Insufficient documentation

## 2018-11-17 DIAGNOSIS — Z87891 Personal history of nicotine dependence: Secondary | ICD-10-CM | POA: Diagnosis not present

## 2018-11-17 DIAGNOSIS — R0602 Shortness of breath: Secondary | ICD-10-CM | POA: Insufficient documentation

## 2018-11-17 DIAGNOSIS — Z8673 Personal history of transient ischemic attack (TIA), and cerebral infarction without residual deficits: Secondary | ICD-10-CM | POA: Insufficient documentation

## 2018-11-17 DIAGNOSIS — I252 Old myocardial infarction: Secondary | ICD-10-CM | POA: Insufficient documentation

## 2018-11-17 DIAGNOSIS — F17210 Nicotine dependence, cigarettes, uncomplicated: Secondary | ICD-10-CM | POA: Diagnosis not present

## 2018-11-17 DIAGNOSIS — Z720 Tobacco use: Secondary | ICD-10-CM | POA: Diagnosis not present

## 2018-11-17 DIAGNOSIS — E785 Hyperlipidemia, unspecified: Secondary | ICD-10-CM | POA: Insufficient documentation

## 2018-11-17 DIAGNOSIS — Z79899 Other long term (current) drug therapy: Secondary | ICD-10-CM | POA: Diagnosis not present

## 2018-11-17 NOTE — Assessment & Plan Note (Signed)
1. Tobacco abuse -This patient meets the criteriafor low dose CT lung cancer screening. She isasymptomatic for any signs or symptoms of lung cancer. -The Shared Decision-Making Visit discussion included risks and benefits of screening, potential for follow up,diagnostic testing for abnormal scans, potential for false positive tests, over diagnosis, and discussion about total radiation exposure. -Patient stated willingness to undergo diagnostics and treatment as needed. - He wascounseled on smoking cessation to decreaserisk of lung cancer, pulmonary disease, heart disease and stroke. - He was given a resource card with information on receiving free nicotine replacement therapy , and information about free smoking cessation classes. - He will present for her LDCT scan today and follow up with PCP.

## 2018-11-17 NOTE — Progress Notes (Signed)
Patient notified via mail of LDCT l ung cancer screening results with recommendations to follow up in 12 months.  Also notified of incidental findings and need to follow up with PCP.  Patient's referring provider was sent a copy of results.    IMPRESSION: 1. Lung-RADS 2, benign appearance or behavior. Continue annual screening with low-dose chest CT without contrast in 12 months. 2.  Emphysema. (ICD10-J43.9) 3.  Aortic Atherosclerois (ICD10-170.0)

## 2018-11-17 NOTE — Patient Instructions (Signed)
You were seen today for your shared decision making visit and a low-dose CT scan for lung cancer screening.    

## 2018-11-17 NOTE — Progress Notes (Signed)
[facility] Initial Visit:  Patient Care Team: [name], FNP as PCP - General (Family Medicine) Satira Sark, MD (Cardiology)  CHIEF COMPLAINTS/PURPOSE OF CONSULTATION: -Shared Decision-Making Visit for Lung Cancer Screening   HISTORY OF PRESENTING ILLNESS: Darryl Ward 73 y.o. male presents today for shared decision making visit for lung cancer screening.  Patient states he started smoking cigarettes at the age of 11.  He has smoked 1 pack a day for the last 60 years.  He admits to a chronic cough without sputum production.  He denies any hemoptysis.  He does report shortness of breath on exertion that resolves with rest.  He denies any chest pain, lightheadedness or dizziness.  Denies any change in bowel habits.  Appetite is stable.  No weight loss.  Patient has a family history positive for cancer of unknown origin in his brother.  Patient also has a personal history of a AAA that was surgically removed.  He also has a history of heart attack and stroke.  The patient does not have any limitations after his stroke.  Review of Systems  Constitutional: Negative.   HENT:  Negative.   Eyes: Negative.   Respiratory: Positive for cough.   Cardiovascular: Negative.   Gastrointestinal: Negative.   Endocrine: Negative.   Genitourinary: Negative.    Musculoskeletal: Positive for arthralgias and back pain.  Skin: Negative.   Neurological: Negative.   Hematological: Negative.   Psychiatric/Behavioral: Negative.     MEDICAL HISTORY: Past Medical History:  Diagnosis Date  . AAA (abdominal aortic aneurysm) ([place])   . Carotid artery disease ([facility])    Nonobstructive  . Cataract   . Coronary atherosclerosis of native coronary artery    PTCA small diagonal 2007 otherwise nonobstructive CAD, EF 50-55%  . Essential hypertension, benign   . Hyperlipidemia   . NSTEMI (non-ST elevated myocardial infarction) ([place]) 2007  . Stroke [facility]) 2004  . TIA  (transient ischemic attack) 2006    SURGICAL HISTORY: Past Surgical History:  Procedure Laterality Date  . AORTA - BILATERAL FEMORAL ARTERY BYPASS GRAFT  01/08/2012   Procedure: AORTA BIFEMORAL BYPASS GRAFT;  Surgeon: Elam Dutch, MD;  Location: [facility] OR;  Service: Vascular;  Laterality: Bilateral;  using 18x46mm x 40cm Hemashield Gold Vascular Graft with Endarterectomy, Thombectomy and  Reimplantation of Inferior Mesenteric Artery  . Left cataract surgery      SOCIAL HISTORY: Social History   Socioeconomic History  . Marital status: Single    Spouse name: Not on file  . Number of children: Not on file  . Years of education: Not on file  . Highest education level: Not on file  Occupational History  . Not on file  Social Needs  . Financial resource strain: Not on file  . Food insecurity    Worry: Not on file    Inability: Not on file  . Transportation needs    Medical: Not on file    Non-medical: Not on file  Tobacco Use  . Smoking status: Current Every Day Smoker    Packs/day: 0.50    Years: 40.00    Pack years: 20.00    Types: Cigarettes  . Smokeless tobacco: Never Used  Substance and Sexual Activity  . Alcohol use: No    Comment: Prior history of regular alcohol use  . Drug use: No  . Sexual activity: Never  Lifestyle  . Physical activity    Days per week: Not on file    Minutes per  session: Not on file  . Stress: Not on file  Relationships  . Social Herbalist on phone: Not on file    Gets together: Not on file    Attends religious service: Not on file    Active member of club or organization: Not on file    Attends meetings of clubs or organizations: Not on file    Relationship status: Not on file  . Intimate partner violence    Fear of current or ex partner: Not on file    Emotionally abused: Not on file    Physically abused: Not on file    Forced sexual activity: Not on file  Other Topics Concern  . Not on file  Social History Narrative   . Not on file    FAMILY HISTORY Family History  Problem Relation Age of Onset  . Hyperlipidemia Sister   . Heart attack Brother     ALLERGIES:  has No Known Allergies.  MEDICATIONS:  Current Outpatient Medications  Medication Sig Dispense Refill  . aspirin 325 MG tablet Take 325 mg by mouth daily.    . celecoxib (CELEBREX) 200 MG capsule Take 1 capsule (200 mg total) by mouth 2 (two) times daily. 60 capsule 2  . metoprolol succinate (TOPROL-XL) 25 MG 24 hr tablet Take 1 tablet (25 mg total) by mouth daily. 90 tablet 1  . rosuvastatin (CRESTOR) 10 MG tablet Take 1 tablet (10 mg total) by mouth daily. 90 tablet 3   No current facility-administered medications for this visit.     PHYSICAL EXAMINATION:  ECOG PERFORMANCE STATUS: 1 - Symptomatic but completely ambulatory   There were no vitals filed for this visit.  There were no vitals filed for this visit.   Physical Exam Constitutional:      Appearance: Normal appearance.  HENT:     Head: Normocephalic.     Right Ear: External ear normal.     Left Ear: External ear normal.     Nose: Nose normal.     Mouth/Throat:     Mouth: Mucous membranes are moist.     Pharynx: Oropharynx is clear.  Eyes:     Conjunctiva/sclera: Conjunctivae normal.  Neck:     Musculoskeletal: Normal range of motion.  Cardiovascular:     Rate and Rhythm: Normal rate and regular rhythm.     Pulses: Normal pulses.     Heart sounds: Normal heart sounds.  Pulmonary:     Effort: Pulmonary effort is normal.     Comments: DIminished breath sounds all four lobes  Abdominal:     General: Bowel sounds are normal.  Musculoskeletal: Normal range of motion.  Skin:    General: Skin is warm.  Neurological:     General: No focal deficit present.     Mental Status: He is alert and oriented to person, place, and time.  Psychiatric:        Mood and Affect: Mood normal.        Thought Content: Thought content normal.        Judgment: Judgment normal.       LABORATORY DATA: I have personally reviewed the data as listed:  Office Visit on 10/27/2018  Component Date Value Ref Range Status  . Glucose 10/27/2018 77  65 - 99 mg/dL Final  . BUN 10/27/2018 10  8 - 27 mg/dL Final  . Creatinine, Ser 10/27/2018 1.09  0.76 - 1.27 mg/dL Final  . GFR calc non Af Amer 10/27/2018 68  >  59 mL/min/1.73 Final  . GFR calc Af Amer 10/27/2018 79  >59 mL/min/1.73 Final  . BUN/Creatinine Ratio 10/27/2018 9* 10 - 24 Final  . Sodium 10/27/2018 139  134 - 144 mmol/L Final  . Potassium 10/27/2018 4.4  3.5 - 5.2 mmol/L Final  . Chloride 10/27/2018 107* 96 - 106 mmol/L Final  . CO2 10/27/2018 21  20 - 29 mmol/L Final  . Calcium 10/27/2018 9.3  8.6 - 10.2 mg/dL Final  . Total Protein 10/27/2018 6.8  6.0 - 8.5 g/dL Final  . Albumin 10/27/2018 3.8  3.7 - 4.7 g/dL Final  . Globulin, Total 10/27/2018 3.0  1.5 - 4.5 g/dL Final  . Albumin/Globulin Ratio 10/27/2018 1.3  1.2 - 2.2 Final  . Bilirubin Total 10/27/2018 0.4  0.0 - 1.2 mg/dL Final  . Alkaline Phosphatase 10/27/2018 59  39 - 117 IU/L Final  . AST 10/27/2018 17  0 - 40 IU/L Final  . ALT 10/27/2018 12  0 - 44 IU/L Final  . Cholesterol, Total 10/27/2018 132  100 - 199 mg/dL Final  . Triglycerides 10/27/2018 93  0 - 149 mg/dL Final  . HDL 10/27/2018 39* >39 mg/dL Final  . VLDL Cholesterol Cal 10/27/2018 18  5 - 40 mg/dL Final  . LDL Chol Calc (NIH) 10/27/2018 75  0 - 99 mg/dL Final  . Chol/HDL Ratio 10/27/2018 3.4  0.0 - 5.0 ratio Final   Comment:                                   T. Chol/HDL Ratio                                             Men  Women                               1/2 Avg.Risk  3.4    3.3                                   Avg.Risk  5.0    4.4                                2X Avg.Risk  9.6    7.1                                3X Avg.Risk 23.4   11.0     RADIOGRAPHIC STUDIES: I have personally reviewed the radiological images as listed and agree with the findings in the report  No  results found.  ASSESSMENT/PLAN   Tobacco abuse 1. Tobacco abuse -This patient meets the criteriafor low dose CT lung cancer screening. She isasymptomatic for any signs or symptoms of lung cancer. -The Shared Decision-Making Visit discussion included risks and benefits of screening, potential for follow up,diagnostic testing for abnormal scans, potential for false positive tests, over diagnosis, and discussion about total radiation exposure. -Patient stated willingness to undergo diagnostics and treatment as needed. - He wascounseled on smoking cessation to decreaserisk of lung cancer, pulmonary disease, heart disease and stroke. -  He was given a resource card with information on receiving free nicotine replacement therapy , and information about free smoking cessation classes. - He will present for her LDCT scan today and follow up with PCP.   No orders of the defined types were placed in this encounter.   All questions were answered. The patient knows to call the clinic with any problems, questions or concerns.  This note was electronically signed.    Roger Shelter, FNP  11/17/2018 12:10 PM

## 2018-11-20 ENCOUNTER — Ambulatory Visit (INDEPENDENT_AMBULATORY_CARE_PROVIDER_SITE_OTHER): Payer: Medicare Other | Admitting: *Deleted

## 2018-11-20 DIAGNOSIS — Z1211 Encounter for screening for malignant neoplasm of colon: Secondary | ICD-10-CM | POA: Diagnosis not present

## 2018-11-20 DIAGNOSIS — Z0000 Encounter for general adult medical examination without abnormal findings: Secondary | ICD-10-CM | POA: Diagnosis not present

## 2018-11-20 NOTE — Patient Instructions (Signed)
Preventive Care 75 Years and Older, Male Preventive care refers to lifestyle choices and visits with your health care provider that can promote health and wellness. This includes:  A yearly physical exam. This is also called an annual well check.  Regular dental and eye exams.  Immunizations.  Screening for certain conditions.  Healthy lifestyle choices, such as diet and exercise. What can I expect for my preventive care visit? Physical exam Your health care provider will check:  Height and weight. These may be used to calculate body mass index (BMI), which is a measurement that tells if you are at a healthy weight.  Heart rate and blood pressure.  Your skin for abnormal spots. Counseling Your health care provider may ask you questions about:  Alcohol, tobacco, and drug use.  Emotional well-being.  Home and relationship well-being.  Sexual activity.  Eating habits.  History of falls.  Memory and ability to understand (cognition).  Work and work Statistician. What immunizations do I need?  Influenza (flu) vaccine  This is recommended every year. Tetanus, diphtheria, and pertussis (Tdap) vaccine  You may need a Td booster every 10 years. Varicella (chickenpox) vaccine  You may need this vaccine if you have not already been vaccinated. Zoster (shingles) vaccine  You may need this after age 50. Pneumococcal conjugate (PCV13) vaccine  One dose is recommended after age 24. Pneumococcal polysaccharide (PPSV23) vaccine  One dose is recommended after age 33. Measles, mumps, and rubella (MMR) vaccine  You may need at least one dose of MMR if you were born in 1957 or later. You may also need a second dose. Meningococcal conjugate (MenACWY) vaccine  You may need this if you have certain conditions. Hepatitis A vaccine  You may need this if you have certain conditions or if you travel or work in places where you may be exposed to hepatitis A. Hepatitis B vaccine   You may need this if you have certain conditions or if you travel or work in places where you may be exposed to hepatitis B. Haemophilus influenzae type b (Hib) vaccine  You may need this if you have certain conditions. You may receive vaccines as individual doses or as more than one vaccine together in one shot (combination vaccines). Talk with your health care provider about the risks and benefits of combination vaccines. What tests do I need? Blood tests  Lipid and cholesterol levels. These may be checked every 5 years, or more frequently depending on your overall health.  Hepatitis C test.  Hepatitis B test. Screening  Lung cancer screening. You may have this screening every year starting at age 74 if you have a 30-pack-year history of smoking and currently smoke or have quit within the past 15 years.  Colorectal cancer screening. All adults should have this screening starting at age 57 and continuing until age 54. Your health care provider may recommend screening at age 47 if you are at increased risk. You will have tests every 1-10 years, depending on your results and the type of screening test.  Prostate cancer screening. Recommendations will vary depending on your family history and other risks.  Diabetes screening. This is done by checking your blood sugar (glucose) after you have not eaten for a while (fasting). You may have this done every 1-3 years.  Abdominal aortic aneurysm (AAA) screening. You may need this if you are a current or former smoker.  Sexually transmitted disease (STD) testing. Follow these instructions at home: Eating and drinking  Eat  a diet that includes fresh fruits and vegetables, whole grains, lean protein, and low-fat dairy products. Limit your intake of foods with high amounts of sugar, saturated fats, and salt.  Take vitamin and mineral supplements as recommended by your health care provider.  Do not drink alcohol if your health care provider  tells you not to drink.  If you drink alcohol: ? Limit how much you have to 0-2 drinks a day. ? Be aware of how much alcohol is in your drink. In the [place] one drink equals one 12 oz bottle of beer (355 mL), one 5 oz glass of wine (148 mL), or one 1 oz glass of hard liquor (44 mL). Lifestyle  Take daily care of your teeth and gums.  Stay active. Exercise for at least 30 minutes on 5 or more days each week.  Do not use any products that contain nicotine or tobacco, such as cigarettes, e-cigarettes, and chewing tobacco. If you need help quitting, ask your health care provider.  If you are sexually active, practice safe sex. Use a condom or other form of protection to prevent STIs (sexually transmitted infections).  Talk with your health care provider about taking a low-dose aspirin or statin. What's next?  Visit your health care provider once a year for a well check visit.  Ask your health care provider how often you should have your eyes and teeth checked.  Stay up to date on all vaccines. This information is not intended to replace advice given to you by your health care provider. Make sure you discuss any questions you have with your health care provider. Document Released: 02/03/2015 Document Revised: 01/01/2018 Document Reviewed: 01/01/2018 [employer]vier Patient Education  2020 [employer]  

## 2018-11-20 NOTE — Progress Notes (Signed)
MEDICARE ANNUAL WELLNESS VISIT  11/20/2018  Telephone Visit Disclaimer This Medicare AWV was conducted by telephone due to national recommendations for restrictions regarding the COVID-19 Pandemic (e.g. social distancing).  I verified, using two identifiers, that I am speaking with Darryl Ward or their authorized healthcare agent. I discussed the limitations, risks, security, and privacy concerns of performing an evaluation and management service by telephone and the potential availability of an in-person appointment in the future. The patient expressed understanding and agreed to proceed.   Subjective:  Darryl Ward is a 73 y.o. male patient of [name], [place] who had a Medicare Annual Wellness Visit today via telephone. Darryl Ward is Working full time at [employer] and lives alone. he has 1 child. he reports that he is socially active and does interact with friends/family regularly. he is moderately physically active and enjoys sleeping, relaxing, yard work and watching sporting events on TV.  Patient Care Team: [name], FNP as PCP - General (Family Medicine) Satira Sark, MD (Cardiology)  Advanced Directives 11/20/2018 01/09/2012 01/08/2012 01/03/2012  Does Patient Have a Medical Advance Directive? No Patient does not have advance directive;Patient would not like information Patient does not have advance directive;Patient would not like information Patient does not have advance directive;Patient would not like information  Would patient like information on creating a medical advance directive? No - Patient declined - - -  Pre-existing out of facility DNR order (yellow form or pink MOST form) - No No No    Hospital Utilization Over the Past 12 Months: # of hospitalizations or ER visits: 0 # of surgeries: 0  Review of Systems    Patient reports that his overall health is unchanged compared to last year.  History obtained from chart review  Patient Reported Readings (BP, Pulse, CBG, Weight, etc) none  Pain Assessment Pain : No/denies pain     Current Medications & Allergies (verified) Allergies as of 11/20/2018   No Known Allergies     Medication List       Accurate as of November 20, 2018 10:16 AM. If you have any questions, ask your nurse or doctor.        aspirin 325 MG tablet Take 325 mg by mouth daily.   celecoxib 200 MG capsule Commonly known as: CeleBREX Take 1 capsule (200 mg total) by mouth 2 (two) times daily.   metoprolol succinate 25 MG 24 hr tablet Commonly known as: TOPROL-XL Take 1 tablet (25 mg total) by mouth daily.   rosuvastatin 10 MG tablet Commonly known as: Crestor Take 1 tablet (10 mg total) by mouth daily.       History (reviewed): Past Medical History:  Diagnosis Date  . AAA (abdominal aortic aneurysm) ([place])   . Carotid artery disease ([facility])    Nonobstructive  . Cataract   . Coronary atherosclerosis of native coronary artery    PTCA small diagonal 2007 otherwise nonobstructive CAD, EF 50-55%  . Essential hypertension, benign   . Hyperlipidemia   . NSTEMI (non-ST elevated myocardial infarction) ([place]) 2007  . Stroke [facility]) 2004  . TIA (transient ischemic attack) 2006   Past Surgical History:  Procedure Laterality Date  . AORTA - BILATERAL FEMORAL ARTERY BYPASS GRAFT  01/08/2012   Procedure: AORTA BIFEMORAL BYPASS GRAFT;  Surgeon: Elam Dutch, MD;  Location: [facility] OR;  Service: Vascular;  Laterality: Bilateral;  using 18x14mm x 40cm Hemashield Gold Vascular Graft with Endarterectomy, Thombectomy and  Reimplantation of Inferior Mesenteric Artery  .  Left cataract surgery     Family History  Problem Relation Age of Onset  . Hyperlipidemia Sister   . Heart attack Brother    Social History   Socioeconomic History  . Marital status: Divorced    Spouse name: Not on file  . Number of children: 1  . Years of education: 21  . Highest education level: 11th grade   Occupational History    Employer: Event organiser  . Financial resource strain: Not hard at all  . Food insecurity    Worry: Never true    Inability: Never true  . Transportation needs    Medical: No    Non-medical: No  Tobacco Use  . Smoking status: Current Every Day Smoker    Packs/day: 1.00    Years: 40.00    Pack years: 40.00    Types: Cigarettes  . Smokeless tobacco: Never Used  Substance and Sexual Activity  . Alcohol use: No    Comment: Prior history of regular alcohol use  . Drug use: No  . Sexual activity: Not Currently  Lifestyle  . Physical activity    Days per week: 0 days    Minutes per session: 0 min  . Stress: Not at all  Relationships  . Social Herbalist on phone: Three times a week    Gets together: Three times a week    Attends religious service: Never    Active member of club or organization: No    Attends meetings of clubs or organizations: Never    Relationship status: Divorced  Other Topics Concern  . Not on file  Social History Narrative  . Not on file    Activities of Daily Living In your present state of health, do you have any difficulty performing the following activities: 11/20/2018  Hearing? Y  Comment wears hearing aids  Vision? Y  Comment has a cataract on the right eye-hasn't had an eye exam since he has the cataract in the left eye removed about 10 years ago  Difficulty concentrating or making decisions? N  Walking or climbing stairs? N  Dressing or bathing? N  Doing errands, shopping? N  Preparing Food and eating ? N  Using the Toilet? N  In the past six months, have you accidently leaked urine? N  Do you have problems with loss of bowel control? N  Managing your Medications? N  Managing your Finances? N  Housekeeping or managing your Housekeeping? N  Some recent data might be hidden    Patient Education/ Literacy How often do you need to have someone help you when you read instructions,  pamphlets, or other written materials from your doctor or pharmacy?: 1 - Never What is the last grade level you completed in school?: 11th grade  Exercise Current Exercise Habits: The patient has a physically strenuous job, but has no regular exercise apart from work., Exercise limited by: None identified  Diet Patient reports consuming 3 meals a day and 1 snack(s) a day Patient reports that his primary diet is: Regular Patient reports that she does have regular access to food.   Depression Screen PHQ 2/9 Scores 11/20/2018 10/27/2018 12/02/2017 04/21/2017 09/09/2016  PHQ - 2 Score 0 0 0 0 0     Fall Risk Fall Risk  11/20/2018 10/27/2018 04/21/2017 09/09/2016  Falls in the past year? 0 0 No Yes  Number falls in past yr: 0 - - 2 or more  Injury with Fall? 0 - -  No     Objective:  Darryl Ward seemed alert and oriented and he participated appropriately during our telephone visit.  Blood Pressure Weight BMI  BP Readings from Last 3 Encounters:  10/27/18 134/68  12/02/17 130/62  04/21/17 131/66   Wt Readings from Last 3 Encounters:  10/27/18 178 lb (80.7 kg)  12/02/17 180 lb (81.6 kg)  04/21/17 180 lb 9.6 oz (81.9 kg)   BMI Readings from Last 1 Encounters:  10/27/18 25.54 kg/m    *Unable to obtain current vital signs, weight, and BMI due to telephone visit type  Hearing/Vision  . Darryl Ward did not seem to have difficulty with hearing/understanding during the telephone conversation . Reports that he has not had a formal eye exam by an eye care professional within the past year . Reports that he has not had a formal hearing evaluation within the past year *Unable to fully assess hearing and vision during telephone visit type  Cognitive Function: 6CIT Screen 11/20/2018  What Year? 0 points  What month? 0 points  What time? 0 points  Count back from 20 0 points  Months in reverse 0 points  Repeat phrase 2 points  Total Score 2   (Normal:0-7, Significant for Dysfunction: >8)   Normal Cognitive Function Screening: Yes   Immunization & Health Maintenance Record Immunization History  Administered Date(s) Administered  . Influenza, High Dose Seasonal PF 11/08/2016, 11/13/2017, 10/09/2018  . Pneumococcal Polysaccharide-23 10/09/2018  . Zoster Recombinat (Shingrix) 10/09/2018    Health Maintenance  Topic Date Due  . COLONOSCOPY  01/22/1997  . TETANUS/TDAP  10/27/2019 (Originally 01/22/1964)  . Hepatitis C Screening  10/27/2019 (Originally [birth year])  . PNA vac Low Risk Adult (2 of 2 - PCV13) 10/09/2019  . INFLUENZA VACCINE  Completed       Assessment  This is a routine wellness examination for Darryl Ward.  Health Maintenance: Due or Overdue Health Maintenance Due  Topic Date Due  . COLONOSCOPY  01/22/1997    Darryl Ward does not need a referral for Community Assistance: Care Management:   no Social Work:    no Prescription Assistance:  no Nutrition/Diabetes Education:  no   Plan:  Personalized Goals Goals Addressed            This Visit's Progress   . DIET - INCREASE WATER INTAKE       Try to drink 6-8 glasses of water daily      Personalized Health Maintenance & Screening Recommendations  Td vaccine Colorectal cancer screening  Lung Cancer Screening Recommended: yes-pt declines at this time (Low Dose CT Chest recommended if Age 59-80 years, 30 pack-year currently smoking OR have quit w/in past 15 years) Hepatitis C Screening recommended: no HIV Screening recommended: no  Advanced Directives: Written information was not prepared per patient's request.  Referrals & Orders No orders of the defined types were placed in this encounter.   Follow-up Plan . Follow-up with [name], FNP as planned . Schedule your Eye Exam as discussed . Referral for GI to have colonoscopy placed today-they will call to schedule  . Consider TDAP vaccine at your next visit with your PCP   I have personally reviewed and noted the  following in the patient's chart:   . Medical and social history . Use of alcohol, tobacco or illicit drugs  . Current medications and supplements . Functional ability and status . Nutritional status . Physical activity . Advanced directives . List of other physicians . Hospitalizations, surgeries,  and ER visits in previous 12 months . Vitals . Screenings to include cognitive, depression, and falls . Referrals and appointments  In addition, I have reviewed and discussed with Darryl Ward certain preventive protocols, quality metrics, and best practice recommendations. A written personalized care plan for preventive services as well as general preventive health recommendations is available and can be mailed to the patient at his request.      Milas Hock, LPN  [id number]

## 2018-11-25 ENCOUNTER — Encounter: Payer: Self-pay | Admitting: Internal Medicine

## 2018-12-29 ENCOUNTER — Ambulatory Visit (INDEPENDENT_AMBULATORY_CARE_PROVIDER_SITE_OTHER): Payer: Self-pay | Admitting: *Deleted

## 2018-12-29 ENCOUNTER — Other Ambulatory Visit: Payer: Self-pay

## 2018-12-29 DIAGNOSIS — Z1211 Encounter for screening for malignant neoplasm of colon: Secondary | ICD-10-CM

## 2018-12-29 MED ORDER — PEG 3350-KCL-NA BICARB-NACL 420 G PO SOLR: 4000.0000 mL | Freq: Once | ORAL | 0 refills | Status: AC

## 2018-12-29 NOTE — Progress Notes (Signed)
Gastroenterology Pre-Procedure Review  Request Date: 12/29/2018 Requesting Physician: Darla Lesches, NP @ [facility], no previous TCS  PATIENT REVIEW QUESTIONS: The patient responded to the following health history questions as indicated:    1. Diabetes Melitis: no 2. Joint replacements in the past 12 months: no 3. Major health problems in the past 3 months: no 4. Has an artificial valve or MVP: no 5. Has a defibrillator: no 6. Has been advised in past to take antibiotics in advance of a procedure like teeth cleaning: no 7. Family history of colon cancer: no  8. Alcohol Use: no 9. Illicit drug Use: no 10. History of sleep apnea: no  11. History of coronary artery or other vascular stents placed within the last 12 months: no 12. History of any prior anesthesia complications: no 13. There is no height or weight on file to calculate BMI.ht: 5'11 wt: 175 lbs    MEDICATIONS & ALLERGIES:    Patient reports the following regarding taking any blood thinners:   Plavix? no Aspirin? yes Coumadin? no Brilinta? no Xarelto? no Eliquis? no Pradaxa? no Savaysa? no Effient? no  Patient confirms/reports the following medications:  Current Outpatient Medications  Medication Sig Dispense Refill  . aspirin 325 MG tablet Take 325 mg by mouth daily.    . celecoxib (CELEBREX) 200 MG capsule Take 1 capsule (200 mg total) by mouth 2 (two) times daily. 60 capsule 2  . metoprolol succinate (TOPROL-XL) 25 MG 24 hr tablet Take 1 tablet (25 mg total) by mouth daily. 90 tablet 1  . rosuvastatin (CRESTOR) 10 MG tablet Take 1 tablet (10 mg total) by mouth daily. 90 tablet 3   No current facility-administered medications for this visit.     Patient confirms/reports the following allergies:  No Known Allergies  No orders of the defined types were placed in this encounter.   AUTHORIZATION INFORMATION Primary Insurance: [employer] Medicare ,  ID #: [id number],  Group #: [id number] Pre-Cert / Josem Kaufmann required: No, not  required  SCHEDULE INFORMATION: Procedure has been scheduled as follows:  Date: 04/14/2019, Time: 9:30 Location: [facility] with Dr. Gala Romney  This Gastroenterology Pre-Precedure Review Form is being routed to the following provider(s): Walden Field, NP

## 2018-12-29 NOTE — Patient Instructions (Signed)
Darryl Ward   08/03/1945 MRN: [id number]    Procedure Date: 04/14/2019 Time to register: 8:30 am Place to register: [facility] Procedure Time: 9:30 am Scheduled provider: Dr. Gala Romney  PREPARATION FOR COLONOSCOPY WITH TRI-LYTE SPLIT PREP  Please notify us immediately if you are diabetic, take iron supplements, or if you are on Coumadin or any other blood thinners.   Please hold the following medications: n/a  You will need to purchase 1 fleet enema and 1 box of Bisacodyl '5mg'$  tablets.   2 DAYS BEFORE PROCEDURE:  DATE: 04/12/2019   DAY: Monday Begin clear liquid diet AFTER your lunch meal. NO SOLID FOODS after this point.  1 DAY BEFORE PROCEDURE:  DATE: 04/13/2019   DAY: Tuesday Continue clear liquids the entire day - NO SOLID FOOD.    At 2:00 pm:  Take 2 Bisacodyl tablets.   At 4:00pm:  Start drinking your solution. Make sure you mix well per instructions on the bottle. Try to drink 1 (one) 8 ounce glass every 10-15 minutes until you have consumed HALF the jug. You should complete by 6:00pm.You must keep the left over solution refrigerated until completed next day.  Continue clear liquids. You must drink plenty of clear liquids to prevent dehyration and kidney failure.     DAY OF PROCEDURE:   DATE: 04/14/2019  DAY: Wednesday If you take medications for your heart, blood pressure or breathing, you may take these medications.    Five hours before your procedure time @ 4:30 am:  Finish remaining amout of bowel prep, drinking 1 (one) 8 ounce glass every 10-15 minutes until complete. You have two hours to consume remaining prep.   Three hours before your procedure time @ 6:30 am:  Nothing by mouth.   At least one hour before going to the hospital:  Give yourself one Fleet enema. You may take your morning medications with sip of water unless we have instructed otherwise.      Please see below for Dietary Information.  CLEAR LIQUIDS INCLUDE:  Water Jello (NOT red in  color)   Ice Popsicles (NOT red in color)   Tea (sugar ok, no milk/cream) Powdered fruit flavored drinks  Coffee (sugar ok, no milk/cream) Gatorade/ Lemonade/ Kool-Aid  (NOT red in color)   Juice: apple, white grape, white cranberry Soft drinks  Clear bullion, consomme, broth (fat free beef/chicken/vegetable)  Carbonated beverages (any kind)  Strained chicken noodle soup Hard Candy   Remember: Clear liquids are liquids that will allow you to see your fingers on the other side of a clear glass. Be sure liquids are NOT red in color, and not cloudy, but CLEAR.  DO NOT EAT OR DRINK ANY OF THE FOLLOWING:  Dairy products of any kind   Cranberry juice Tomato juice / V8 juice   Grapefruit juice Orange juice     Red grape juice  Do not eat any solid foods, including such foods as: cereal, oatmeal, yogurt, fruits, vegetables, creamed soups, eggs, bread, crackers, pureed foods in a blender, etc.   HELPFUL HINTS FOR DRINKING PREP SOLUTION:   Make sure prep is extremely cold. Mix and refrigerate the the morning of the prep. You may also put in the freezer.   You may try mixing some Crystal Light or Country Time Lemonade if you prefer. Mix in small amounts; add more if necessary.  Try drinking through a straw  Rinse mouth with water or a mouthwash between glasses, to remove after-taste.  Try sipping on  a cold beverage /ice/ popsicles between glasses of prep.  Place a piece of sugar-free hard candy in mouth between glasses.  If you become nauseated, try consuming smaller amounts, or stretch out the time between glasses. Stop for 30-60 minutes, then slowly start back drinking.        OTHER INSTRUCTIONS  You will need a responsible adult at least 71 years of age to accompany you and drive you home. This person must remain in the waiting room during your procedure. The hospital will cancel your procedure if you do not have a responsible adult with you.   1. Wear loose fitting clothing that  is easily removed. 2. Leave jewelry and other valuables at home.  3. Remove all body piercing jewelry and leave at home. 4. Total time from sign-in until discharge is approximately 2-3 hours. 5. You should go home directly after your procedure and rest. You can resume normal activities the day after your procedure. 6. The day of your procedure you should not:  Drive  Make legal decisions  Operate machinery  Drink alcohol  Return to work   You may call the office (Dept: [phone]) before 5:00pm, or page the doctor on call [phone]) after 5:00pm, for further instructions, if necessary.   Insurance Information YOU WILL NEED TO CHECK WITH YOUR INSURANCE COMPANY FOR THE BENEFITS OF COVERAGE YOU HAVE FOR THIS PROCEDURE.  UNFORTUNATELY, NOT ALL INSURANCE COMPANIES HAVE BENEFITS TO COVER ALL OR PART OF THESE TYPES OF PROCEDURES.  IT IS YOUR RESPONSIBILITY TO CHECK YOUR BENEFITS, HOWEVER, WE WILL BE GLAD TO ASSIST YOU WITH ANY CODES YOUR INSURANCE COMPANY MAY NEED.    PLEASE NOTE THAT MOST INSURANCE COMPANIES WILL NOT COVER A SCREENING COLONOSCOPY FOR PEOPLE UNDER THE AGE OF 71  IF YOU HAVE BCBS INSURANCE, YOU MAY HAVE BENEFITS FOR A SCREENING COLONOSCOPY BUT IF POLYPS ARE FOUND THE DIAGNOSIS WILL CHANGE AND THEN YOU MAY HAVE A DEDUCTIBLE THAT WILL NEED TO BE MET. SO PLEASE MAKE SURE YOU CHECK YOUR BENEFITS FOR A SCREENING COLONOSCOPY AS WELL AS A DIAGNOSTIC COLONOSCOPY.

## 2018-12-30 NOTE — Addendum Note (Signed)
Addended by: Metro Kung on: 12/30/2018 04:41 PM   Modules accepted: Orders, SmartSet

## 2018-12-30 NOTE — Progress Notes (Signed)
Ok to schedule.

## 2019-01-05 ENCOUNTER — Other Ambulatory Visit: Payer: Self-pay | Admitting: Nurse Practitioner

## 2019-01-22 HISTORY — PX: BACK SURGERY: SHX140

## 2019-02-23 ENCOUNTER — Telehealth: Payer: Self-pay | Admitting: Internal Medicine

## 2019-02-23 NOTE — Telephone Encounter (Signed)
Miralax instructions mailed.  Tried to call pt, no answer, LMOVM to inform pt.

## 2019-02-23 NOTE — Telephone Encounter (Signed)
Pt said his prep was on back order at [facility] in [place] and his procedure is with [facility]MR on 04/14/2019. [phone]

## 2019-04-12 ENCOUNTER — Other Ambulatory Visit (HOSPITAL_COMMUNITY)
Admission: RE | Admit: 2019-04-12 | Discharge: 2019-04-12 | Disposition: A | Discharge status: Home / Self Care | Payer: Medicare Other | Source: Ambulatory Visit | Attending: Internal Medicine | Admitting: Internal Medicine

## 2019-04-12 ENCOUNTER — Other Ambulatory Visit: Payer: Self-pay

## 2019-04-12 DIAGNOSIS — I252 Old myocardial infarction: Secondary | ICD-10-CM | POA: Diagnosis not present

## 2019-04-12 DIAGNOSIS — Z8673 Personal history of transient ischemic attack (TIA), and cerebral infarction without residual deficits: Secondary | ICD-10-CM | POA: Diagnosis not present

## 2019-04-12 DIAGNOSIS — Z01812 Encounter for preprocedural laboratory examination: Secondary | ICD-10-CM | POA: Insufficient documentation

## 2019-04-12 DIAGNOSIS — D120 Benign neoplasm of cecum: Secondary | ICD-10-CM | POA: Diagnosis not present

## 2019-04-12 DIAGNOSIS — I2510 Atherosclerotic heart disease of native coronary artery without angina pectoris: Secondary | ICD-10-CM | POA: Diagnosis not present

## 2019-04-12 DIAGNOSIS — Z20822 Contact with and (suspected) exposure to covid-19: Secondary | ICD-10-CM | POA: Insufficient documentation

## 2019-04-12 DIAGNOSIS — D124 Benign neoplasm of descending colon: Secondary | ICD-10-CM | POA: Diagnosis not present

## 2019-04-12 DIAGNOSIS — D123 Benign neoplasm of transverse colon: Secondary | ICD-10-CM | POA: Diagnosis not present

## 2019-04-12 DIAGNOSIS — E785 Hyperlipidemia, unspecified: Secondary | ICD-10-CM | POA: Diagnosis not present

## 2019-04-12 DIAGNOSIS — Z1211 Encounter for screening for malignant neoplasm of colon: Secondary | ICD-10-CM | POA: Diagnosis not present

## 2019-04-12 DIAGNOSIS — Z791 Long term (current) use of non-steroidal anti-inflammatories (NSAID): Secondary | ICD-10-CM | POA: Diagnosis not present

## 2019-04-12 DIAGNOSIS — I10 Essential (primary) hypertension: Secondary | ICD-10-CM | POA: Diagnosis not present

## 2019-04-12 DIAGNOSIS — Z95828 Presence of other vascular implants and grafts: Secondary | ICD-10-CM | POA: Diagnosis not present

## 2019-04-12 DIAGNOSIS — K621 Rectal polyp: Secondary | ICD-10-CM | POA: Diagnosis not present

## 2019-04-12 DIAGNOSIS — Z7982 Long term (current) use of aspirin: Secondary | ICD-10-CM | POA: Diagnosis not present

## 2019-04-12 DIAGNOSIS — F17210 Nicotine dependence, cigarettes, uncomplicated: Secondary | ICD-10-CM | POA: Diagnosis not present

## 2019-04-12 DIAGNOSIS — Z79899 Other long term (current) drug therapy: Secondary | ICD-10-CM | POA: Diagnosis not present

## 2019-04-12 LAB — SARS CORONAVIRUS 2 (TAT 6-24 HRS): SARS Coronavirus 2: NEGATIVE

## 2019-04-13 ENCOUNTER — Telehealth: Payer: Self-pay | Admitting: Internal Medicine

## 2019-04-13 NOTE — Telephone Encounter (Signed)
Pt is scheduled with [facility] tomorrow. He has lost his instruction sheet. He said he ate after 4pm yesterday, but has not eaten today. Please advise if he can continue the prep or if he needs to be rescheduled. Patient is aware NOT to eat anything until he hears back from [name]. Please advise and call him at [phone]

## 2019-04-13 NOTE — Telephone Encounter (Signed)
Routing to EG for advisement since he signed off on original triage.  Will call pt with recommendations.

## 2019-04-13 NOTE — Telephone Encounter (Addendum)
Spoke to RMR and pt ok to have procedure tomorrow.  Called pt and informed him to be sure to follow prep instructions for today and tomorrow. Reviewed prep instructions with pt and his friend.  Pt voiced understanding.

## 2019-04-14 ENCOUNTER — Other Ambulatory Visit: Payer: Self-pay

## 2019-04-14 ENCOUNTER — Encounter (HOSPITAL_COMMUNITY)
Admission: RE | Disposition: A | Discharge status: Home / Self Care | Payer: Self-pay | Source: Home / Self Care | Attending: Internal Medicine

## 2019-04-14 ENCOUNTER — Ambulatory Visit (HOSPITAL_COMMUNITY)
Admission: RE | Admit: 2019-04-14 | Discharge: 2019-04-14 | Disposition: A | Discharge status: Home / Self Care | Payer: Medicare Other | Attending: Internal Medicine | Admitting: Internal Medicine

## 2019-04-14 DIAGNOSIS — Z7982 Long term (current) use of aspirin: Secondary | ICD-10-CM | POA: Diagnosis not present

## 2019-04-14 DIAGNOSIS — Z8673 Personal history of transient ischemic attack (TIA), and cerebral infarction without residual deficits: Secondary | ICD-10-CM | POA: Diagnosis not present

## 2019-04-14 DIAGNOSIS — D120 Benign neoplasm of cecum: Secondary | ICD-10-CM | POA: Diagnosis not present

## 2019-04-14 DIAGNOSIS — I10 Essential (primary) hypertension: Secondary | ICD-10-CM | POA: Diagnosis not present

## 2019-04-14 DIAGNOSIS — K621 Rectal polyp: Secondary | ICD-10-CM | POA: Diagnosis not present

## 2019-04-14 DIAGNOSIS — D124 Benign neoplasm of descending colon: Secondary | ICD-10-CM | POA: Diagnosis not present

## 2019-04-14 DIAGNOSIS — I252 Old myocardial infarction: Secondary | ICD-10-CM | POA: Diagnosis not present

## 2019-04-14 DIAGNOSIS — F17210 Nicotine dependence, cigarettes, uncomplicated: Secondary | ICD-10-CM | POA: Insufficient documentation

## 2019-04-14 DIAGNOSIS — K635 Polyp of colon: Secondary | ICD-10-CM

## 2019-04-14 DIAGNOSIS — Z79899 Other long term (current) drug therapy: Secondary | ICD-10-CM | POA: Diagnosis not present

## 2019-04-14 DIAGNOSIS — E785 Hyperlipidemia, unspecified: Secondary | ICD-10-CM | POA: Diagnosis not present

## 2019-04-14 DIAGNOSIS — Z791 Long term (current) use of non-steroidal anti-inflammatories (NSAID): Secondary | ICD-10-CM | POA: Insufficient documentation

## 2019-04-14 DIAGNOSIS — D123 Benign neoplasm of transverse colon: Secondary | ICD-10-CM | POA: Insufficient documentation

## 2019-04-14 DIAGNOSIS — Z1211 Encounter for screening for malignant neoplasm of colon: Secondary | ICD-10-CM | POA: Diagnosis not present

## 2019-04-14 DIAGNOSIS — Z95828 Presence of other vascular implants and grafts: Secondary | ICD-10-CM | POA: Diagnosis not present

## 2019-04-14 DIAGNOSIS — Z20822 Contact with and (suspected) exposure to covid-19: Secondary | ICD-10-CM | POA: Diagnosis not present

## 2019-04-14 DIAGNOSIS — I2510 Atherosclerotic heart disease of native coronary artery without angina pectoris: Secondary | ICD-10-CM | POA: Diagnosis not present

## 2019-04-14 HISTORY — PX: COLONOSCOPY: SHX5424

## 2019-04-14 HISTORY — PX: POLYPECTOMY: SHX5525

## 2019-04-14 SURGERY — COLONOSCOPY
Anesthesia: Moderate Sedation

## 2019-04-14 MED ORDER — MEPERIDINE HCL 100 MG/ML IJ SOLN
INTRAMUSCULAR | Status: DC | PRN
  Administered 2019-04-14: 25 mg via INTRAVENOUS

## 2019-04-14 MED ORDER — MIDAZOLAM HCL 5 MG/5ML IJ SOLN
INTRAMUSCULAR | Status: DC | PRN
  Administered 2019-04-14 (×2): 1 mg via INTRAVENOUS

## 2019-04-14 MED ORDER — SODIUM CHLORIDE 0.9 % IV SOLN
INTRAVENOUS | Status: DC
  Administered 2019-04-14: 09:00:00 1000 mL via INTRAVENOUS

## 2019-04-14 MED ORDER — MEPERIDINE HCL 50 MG/ML IJ SOLN
INTRAMUSCULAR | Status: AC
  Filled 2019-04-14: qty 1

## 2019-04-14 MED ORDER — MIDAZOLAM HCL 5 MG/5ML IJ SOLN
INTRAMUSCULAR | Status: AC
  Filled 2019-04-14: qty 10

## 2019-04-14 MED ORDER — STERILE WATER FOR IRRIGATION IR SOLN
Status: DC | PRN
  Administered 2019-04-14: 100 mL

## 2019-04-14 MED ORDER — ONDANSETRON HCL 4 MG/2ML IJ SOLN
INTRAMUSCULAR | Status: DC | PRN
  Administered 2019-04-14: 4 mg via INTRAVENOUS

## 2019-04-14 MED ORDER — ONDANSETRON HCL 4 MG/2ML IJ SOLN
INTRAMUSCULAR | Status: AC
  Filled 2019-04-14: qty 2

## 2019-04-14 NOTE — Op Note (Signed)
[facility] Patient Name: Darryl Ward Procedure Date: 04/14/2019 8:58 AM MRN: [id number] Date of Birth: November 02, 1945 Attending MD: Norvel Richards , MD CSN: [id number] Age: 74 Admit Type: Outpatient Procedure:                Colonoscopy Indications:              Screening for colorectal malignant neoplasm Providers:                Norvel Richards, MD, Rosina Lowenstein, RN, Nelma Rothman, Technician Referring MD:             [name] FNP Medicines:                Midazolam 2 mg IV, Meperidine 25 mg IV Complications:            No immediate complications. Estimated Blood Loss:     Estimated blood loss was minimal. Procedure:                Pre-Anesthesia Assessment:                           - Prior to the procedure, a History and Physical                            was performed, and patient medications and                            allergies were reviewed. The patient's tolerance of                            previous anesthesia was also reviewed. The risks                            and benefits of the procedure and the sedation                            options and risks were discussed with the patient.                            All questions were answered, and informed consent                            was obtained. Prior Anticoagulants: The patient has                            taken no previous anticoagulant or antiplatelet                            agents. ASA Grade Assessment: II - A patient with                            mild systemic disease. After reviewing the risks  and benefits, the patient was deemed in                            satisfactory condition to undergo the procedure.                           After obtaining informed consent, the colonoscope                            was passed under direct vision. Throughout the                            procedure, the patient's blood pressure,  pulse, and                            oxygen saturations were monitored continuously. The                            CF-[id number] ([id number]) scope was introduced through                            the anus and advanced to the the cecum, identified                            by appendiceal orifice and ileocecal valve. The                            colonoscopy was performed without difficulty. The                            patient tolerated the procedure well. The quality                            of the bowel preparation was adequate. Scope In: 9:09:39 AM Scope Out: 9:41:41 AM Scope Withdrawal Time: 0 hours 28 minutes 51 seconds  Total Procedure Duration: 0 hours 32 minutes 2 seconds  Findings:      The perianal and digital rectal examinations were normal.      Seven semi-pedunculated polyps were found in the rectum, mid rectum,       descending colon, hepatic flexure and cecum. 3 to 8 mm?"sessile. These       polyps were removed with a cold snare. Resection and retrieval were       complete. Estimated blood loss was minimal.      The exam was otherwise without abnormality on direct and retroflexion       views. Impression:               - Seven polyps in the rectum, in the mid rectum, in                            the descending colon, at the hepatic flexure and in                            the cecum, removed with a cold snare. Resected and  retrieved.                           - The examination was otherwise normal on direct                            and retroflexion views. Moderate Sedation:      Moderate (conscious) sedation was administered by the endoscopy nurse       and supervised by the endoscopist. The following parameters were       monitored: oxygen saturation, heart rate, blood pressure, respiratory       rate, EKG, adequacy of pulmonary ventilation, and response to care.       Total physician intraservice time was 37 minutes. Recommendation:            - Patient has a contact number available for                            emergencies. The signs and symptoms of potential                            delayed complications were discussed with the                            patient. Return to normal activities tomorrow.                            Written discharge instructions were provided to the                            patient.                           - Advance diet as tolerated.                           - Continue present medications.                           - Repeat colonoscopy date to be determined after                            pending pathology results are reviewed for                            surveillance based on pathology results.                           - Return to GI office (date not yet determined). Procedure Code(s):        --- Professional ---                           [phone], Colonoscopy, flexible; with removal of                            tumor(s), polyp(s), or other lesion(s) by snare  technique                           [id number], Moderate sedation; each additional 15                            minutes intraservice time                           G0500, Moderate sedation services provided by the                            same physician or other qualified health care                            professional performing a gastrointestinal                            endoscopic service that sedation supports,                            requiring the presence of an independent trained                            observer to assist in the monitoring of the                            patient's level of consciousness and physiological                            status; initial 15 minutes of intra-service time;                            patient age 45 years or older (additional time may                            be reported with [phone], as appropriate) Diagnosis Code(s):        ---  Professional ---                           Z12.11, Encounter for screening for malignant                            neoplasm of colon                           K62.1, Rectal polyp                           K63.5, Polyp of colon CPT copyright 2019 American Medical Association. All rights reserved. The codes documented in this report are preliminary and upon coder review may  be revised to meet current compliance requirements. Cristopher Estimable. Lovie Agresta, MD Norvel Richards, MD 04/14/2019 9:51:18 AM This report has been signed electronically. Number of Addenda: 0

## 2019-04-14 NOTE — H&P (Signed)
@LOGO @   Primary Care Physician:  [name], FNP Primary Gastroenterologist:  Dr. Gala Romney  Pre-Procedure History & Physical: HPI:  Darryl Ward is a 74 y.o. male is here for a screening colonoscopy.  First ever average risk screening examination.  No bowel symptoms.  No family history of CRC.  Past Medical History:  Diagnosis Date  . AAA (abdominal aortic aneurysm) ([place])   . Carotid artery disease (HCC)    Nonobstructive  . Cataract   . Coronary atherosclerosis of native coronary artery    PTCA small diagonal 2007 otherwise nonobstructive CAD, EF 50-55%  . Essential hypertension, benign   . Hyperlipidemia   . NSTEMI (non-ST elevated myocardial infarction) ([place]) 2007  . Stroke [facility]) 2004  . TIA (transient ischemic attack) 2006    Past Surgical History:  Procedure Laterality Date  . AORTA - BILATERAL FEMORAL ARTERY BYPASS GRAFT  01/08/2012   Procedure: AORTA BIFEMORAL BYPASS GRAFT;  Surgeon: Elam Dutch, MD;  Location: [facility] OR;  Service: Vascular;  Laterality: Bilateral;  using 18x69mm x 40cm Hemashield Gold Vascular Graft with Endarterectomy, Thombectomy and  Reimplantation of Inferior Mesenteric Artery  . Left cataract surgery      Prior to Admission medications   Medication Sig Start Date End Date Taking? Authorizing Provider  aspirin EC 81 MG tablet Take 81 mg by mouth 3 (three) times a week.   Yes [provider]  celecoxib (CELEBREX) 200 MG capsule TAKE 1 CAPSULE BY MOUTH TWICE A DAY Patient taking differently: Take 200 mg by mouth 2 (two) times daily.  01/05/19  Yes Hassell Done, Mary-Margaret, FNP  metoprolol succinate (TOPROL-XL) 25 MG 24 hr tablet Take 1 tablet (25 mg total) by mouth daily. 10/27/18  Yes Martin, Mary-Margaret, FNP  rosuvastatin (CRESTOR) 10 MG tablet Take 1 tablet (10 mg total) by mouth daily. 10/27/18  Yes Hassell Done, Mary-Margaret, FNP    Allergies as of 12/30/2018  . (No Known Allergies)    Family History  Problem Relation Age of  Onset  . Hyperlipidemia Sister   . Heart attack Brother     Social History   Socioeconomic History  . Marital status: Divorced    Spouse name: Not on file  . Number of children: 1  . Years of education: 13  . Highest education level: 11th grade  Occupational History    Employer: Probation officer  Tobacco Use  . Smoking status: Current Every Day Smoker    Packs/day: 1.00    Years: 40.00    Pack years: 40.00    Types: Cigarettes  . Smokeless tobacco: Never Used  Substance and Sexual Activity  . Alcohol use: No    Comment: Prior history of regular alcohol use  . Drug use: No  . Sexual activity: Not Currently  Other Topics Concern  . Not on file  Social History Narrative  . Not on file   Social Determinants of Health   Financial Resource Strain: Low Risk   . Difficulty of Paying Living Expenses: Not hard at all  Food Insecurity: No Food Insecurity  . Worried About Charity fundraiser in the Last Year: Never true  . Ran Out of Food in the Last Year: Never true  Transportation Needs: No Transportation Needs  . Lack of Transportation (Medical): No  . Lack of Transportation (Non-Medical): No  Physical Activity: Inactive  . Days of Exercise per Week: 0 days  . Minutes of Exercise per Session: 0 min  Stress: No Stress Concern Present  .  Feeling of Stress : Not at all  Social Connections: Moderately Isolated  . Frequency of Communication with Friends and Family: Three times a week  . Frequency of Social Gatherings with Friends and Family: Three times a week  . Attends Religious Services: Never  . Active Member of Clubs or Organizations: No  . Attends Archivist Meetings: Never  . Marital Status: Divorced  Human resources officer Violence: Not At Risk  . Fear of Current or Ex-Partner: No  . Emotionally Abused: No  . Physically Abused: No  . Sexually Abused: No    Review of Systems: See HPI, otherwise negative ROS  Physical Exam: BP 134/68   Pulse (!) 56    Temp 97.6 F (36.4 C) (Oral)   Resp 12   Ht 5\' 10"  (1.778 m)   Wt 81.6 kg   SpO2 99%   BMI 25.83 kg/m  General:   Alert,  Well-developed, well-nourished, pleasant and cooperative in NAD Lungs:  Clear throughout to auscultation.   No wheezes, crackles, or rhonchi. No acute distress. Heart:  Regular rate and rhythm; no murmurs, clicks, rubs,  or gallops. Abdomen:  Soft, nontender and nondistended. No masses, hepatosplenomegaly or hernias noted. Normal bowel sounds, without guarding, and without rebound.    Impression/Plan: Darryl Ward is now here to undergo a screening colonoscopy.  First ever average rescreening examination.  Risks, benefits, limitations, imponderables and alternatives regarding colonoscopy have been reviewed with the patient. Questions have been answered. All parties agreeable.     Notice:  This dictation was prepared with Dragon dictation along with smaller phrase technology. Any transcriptional errors that result from this process are unintentional and may not be corrected upon review.

## 2019-04-14 NOTE — Discharge Instructions (Signed)
Colonoscopy Discharge Instructions  Read the instructions outlined below and refer to this sheet in the next few weeks. These discharge instructions provide you with general information on caring for yourself after you leave the hospital. Your doctor may also give you specific instructions. While your treatment has been planned according to the most current medical practices available, unavoidable complications occasionally occur. If you have any problems or questions after discharge, call Dr. Gala Romney at [phone]. ACTIVITY  You may resume your regular activity, but move at a slower pace for the next 24 hours.   Take frequent rest periods for the next 24 hours.   Walking will help get rid of the air and reduce the bloated feeling in your belly (abdomen).   No driving for 24 hours (because of the medicine (anesthesia) used during the test).    Do not sign any important legal documents or operate any machinery for 24 hours (because of the anesthesia used during the test).  NUTRITION  Drink plenty of fluids.   You may resume your normal diet as instructed by your doctor.   Begin with a light meal and progress to your normal diet. Heavy or fried foods are harder to digest and may make you feel sick to your stomach (nauseated).   Avoid alcoholic beverages for 24 hours or as instructed.  MEDICATIONS  You may resume your normal medications unless your doctor tells you otherwise.  WHAT YOU CAN EXPECT TODAY  Some feelings of bloating in the abdomen.   Passage of more gas than usual.   Spotting of blood in your stool or on the toilet paper.  IF YOU HAD POLYPS REMOVED DURING THE COLONOSCOPY:  No aspirin products for 7 days or as instructed.   No alcohol for 7 days or as instructed.   Eat a soft diet for the next 24 hours.  FINDING OUT THE RESULTS OF YOUR TEST Not all test results are available during your visit. If your test results are not back during the visit, make an appointment  with your caregiver to find out the results. Do not assume everything is normal if you have not heard from your caregiver or the medical facility. It is important for you to follow up on all of your test results.  SEEK IMMEDIATE MEDICAL ATTENTION IF:  You have more than a spotting of blood in your stool.   Your belly is swollen (abdominal distention).   You are nauseated or vomiting.   You have a temperature over 101.   You have abdominal pain or discomfort that is severe or gets worse throughout the day.    Colon Polyps  Polyps are tissue growths inside the body. Polyps can grow in many places, including the large intestine (colon). A polyp may be a round bump or a mushroom-shaped growth. You could have one polyp or several. Most colon polyps are noncancerous (benign). However, some colon polyps can become cancerous over time. Finding and removing the polyps early can help prevent this. What are the causes? The exact cause of colon polyps is not known. What increases the risk? You are more likely to develop this condition if you:  Have a family history of colon cancer or colon polyps.  Are older than 67 or older than 45 if you are African American.  Have inflammatory bowel disease, such as ulcerative colitis or Crohn's disease.  Have certain hereditary conditions, such as: ? Familial adenomatous polyposis. ? Lynch syndrome. ? Turcot syndrome. ? Peutz-Jeghers syndrome.  Are  overweight.  Smoke cigarettes.  Do not get enough exercise.  Drink too much alcohol.  Eat a diet that is high in fat and red meat and low in fiber.  Had childhood cancer that was treated with abdominal radiation. What are the signs or symptoms? Most polyps do not cause symptoms. If you have symptoms, they may include:  Blood coming from your rectum when having a bowel movement.  Blood in your stool. The stool may look dark red or black.  Abdominal pain.  A change in bowel habits, such as  constipation or diarrhea. How is this diagnosed? This condition is diagnosed with a colonoscopy. This is a procedure in which a lighted, flexible scope is inserted into the anus and then passed into the colon to examine the area. Polyps are sometimes found when a colonoscopy is done as part of routine cancer screening tests. How is this treated? Treatment for this condition involves removing any polyps that are found. Most polyps can be removed during a colonoscopy. Those polyps will then be tested for cancer. Additional treatment may be needed depending on the results of testing. Follow these instructions at home: Lifestyle  Maintain a healthy weight, or lose weight if recommended by your health care provider.  Exercise every day or as told by your health care provider.  Do not use any products that contain nicotine or tobacco, such as cigarettes and e-cigarettes. If you need help quitting, ask your health care provider.  If you drink alcohol, limit how much you have: ? 0-1 drink a day for women. ? 0-2 drinks a day for men.  Be aware of how much alcohol is in your drink. In the [place] one drink equals one 12 oz bottle of beer (355 mL), one 5 oz glass of wine (148 mL), or one 1 oz shot of hard liquor (44 mL). Eating and drinking   Eat foods that are high in fiber, such as fruits, vegetables, and whole grains.  Eat foods that are high in calcium and vitamin D, such as milk, cheese, yogurt, eggs, liver, fish, and broccoli.  Limit foods that are high in fat, such as fried foods and desserts.  Limit the amount of red meat and processed meat you eat, such as hot dogs, sausage, bacon, and lunch meats. General instructions  Keep all follow-up visits as told by your health care provider. This is important. ? This includes having regularly scheduled colonoscopies. ? Talk to your health care provider about when you need a colonoscopy. Contact a health care provider if:  You have new or  worsening bleeding during a bowel movement.  You have new or increased blood in your stool.  You have a change in bowel habits.  You lose weight for no known reason. Summary  Polyps are tissue growths inside the body. Polyps can grow in many places, including the colon.  Most colon polyps are noncancerous (benign), but some can become cancerous over time.  This condition is diagnosed with a colonoscopy.  Treatment for this condition involves removing any polyps that are found. Most polyps can be removed during a colonoscopy. This information is not intended to replace advice given to you by your health care provider. Make sure you discuss any questions you have with your health care provider. Document Revised: 04/24/2017 Document Reviewed: 04/24/2017 Elsevier Patient Education  [employer].   Colon polyp information provided  Further recommendations to follow pending review of pathology report  At patient request, I called Coralyn Mark  Smith at [phone]  -reviewed results.

## 2019-04-15 ENCOUNTER — Encounter: Payer: Self-pay | Admitting: Internal Medicine

## 2019-04-15 LAB — SURGICAL PATHOLOGY

## 2019-04-21 ENCOUNTER — Other Ambulatory Visit: Payer: Self-pay | Admitting: Nurse Practitioner

## 2019-04-27 ENCOUNTER — Other Ambulatory Visit: Payer: Self-pay

## 2019-04-27 ENCOUNTER — Ambulatory Visit (INDEPENDENT_AMBULATORY_CARE_PROVIDER_SITE_OTHER): Payer: Medicare Other | Admitting: Nurse Practitioner

## 2019-04-27 ENCOUNTER — Encounter: Payer: Self-pay | Admitting: Nurse Practitioner

## 2019-04-27 VITALS — BP 124/62 | HR 56 | Temp 99.3°F | Resp 20 | Ht 70.0 in | Wt 176.0 lb

## 2019-04-27 DIAGNOSIS — I2510 Atherosclerotic heart disease of native coronary artery without angina pectoris: Secondary | ICD-10-CM

## 2019-04-27 DIAGNOSIS — I739 Peripheral vascular disease, unspecified: Secondary | ICD-10-CM | POA: Diagnosis not present

## 2019-04-27 DIAGNOSIS — M545 Low back pain, unspecified: Secondary | ICD-10-CM | POA: Insufficient documentation

## 2019-04-27 DIAGNOSIS — Z720 Tobacco use: Secondary | ICD-10-CM | POA: Diagnosis not present

## 2019-04-27 DIAGNOSIS — I714 Abdominal aortic aneurysm, without rupture, unspecified: Secondary | ICD-10-CM

## 2019-04-27 DIAGNOSIS — E782 Mixed hyperlipidemia: Secondary | ICD-10-CM | POA: Diagnosis not present

## 2019-04-27 DIAGNOSIS — G8929 Other chronic pain: Secondary | ICD-10-CM | POA: Insufficient documentation

## 2019-04-27 MED ORDER — LIDOCAINE 4 % EX PTCH: 1.0000 | MEDICATED_PATCH | CUTANEOUS | 1 refills | Status: AC

## 2019-04-27 MED ORDER — ROSUVASTATIN CALCIUM 10 MG PO TABS: 10.0000 mg | ORAL_TABLET | Freq: Every day | ORAL | 1 refills | Status: DC

## 2019-04-27 MED ORDER — METOPROLOL SUCCINATE ER 25 MG PO TB24: 25.0000 mg | ORAL_TABLET | Freq: Every day | ORAL | 1 refills | Status: DC

## 2019-04-27 NOTE — Progress Notes (Signed)
Subjective:    Patient ID: Darryl Ward, male    DOB: [birth year], 74 y.o.   MRN: [id number]   Chief Complaint: Medical Management of Chronic Issues    HPI:  1. Aneurysm of abdominal vessel ([place]) Surgical repair in 2015. Has not seen vascular surgeon since then.  2. PVD (peripheral vascular disease) ([place]) Has not seen vascular surgeon since 2015.  3. Mixed hyperlipidemia Does not watch fat or cholesterol intake. Is very active at work. Lab Results  Component Value Date   CHOL 132 10/27/2018   HDL 39 (L) 10/27/2018   LDLCALC 75 10/27/2018   TRIG 93 10/27/2018   CHOLHDL 3.4 10/27/2018    4. Tobacco abuse Is still smoking. Has thought about quitting but has made no plans to do so.   5. Atherosclerosis of native coronary artery of native heart without angina pectoris Has not seen cardiology for a couple of years. Denies chest pain, sob, dizziness, headaches.    Outpatient Encounter Medications as of 04/27/2019  Medication Sig  . aspirin EC 81 MG tablet Take 81 mg by mouth 3 (three) times a week.  . celecoxib (CELEBREX) 200 MG capsule TAKE 1 CAPSULE BY MOUTH TWICE A DAY  . metoprolol succinate (TOPROL-XL) 25 MG 24 hr tablet Take 1 tablet (25 mg total) by mouth daily.  . rosuvastatin (CRESTOR) 10 MG tablet Take 1 tablet (10 mg total) by mouth daily.   No facility-administered encounter medications on file as of 04/27/2019.    Past Surgical History:  Procedure Laterality Date  . AORTA - BILATERAL FEMORAL ARTERY BYPASS GRAFT  01/08/2012   Procedure: AORTA BIFEMORAL BYPASS GRAFT;  Surgeon: Elam Dutch, MD;  Location: [facility] OR;  Service: Vascular;  Laterality: Bilateral;  using 18x61mm x 40cm Hemashield Gold Vascular Graft with Endarterectomy, Thombectomy and  Reimplantation of Inferior Mesenteric Artery  . COLONOSCOPY N/A 04/14/2019   Procedure: COLONOSCOPY;  Surgeon: Daneil Dolin, MD;  Location: [facility] ENDO SUITE;  Service: Endoscopy;  Laterality: N/A;  9:30  . Left cataract  surgery    . POLYPECTOMY  04/14/2019   Procedure: POLYPECTOMY;  Surgeon: Daneil Dolin, MD;  Location: [facility];  Service: Endoscopy;;    Family History  Problem Relation Age of Onset  . Hyperlipidemia Sister   . Heart attack Brother     New complaints: Bilateral hands are still hurting but celebrex is helping. Has been having some lower back pain but a friend gave him some lidocaine patches that have helped him.  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  Controlled substance contract: N/A     Review of Systems  Constitutional: Negative.   HENT: Negative.   Eyes: Negative.   Respiratory: Negative.   Cardiovascular: Negative.   Gastrointestinal: Negative.   Endocrine: Negative.   Genitourinary: Negative.   Musculoskeletal: Positive for arthralgias (bil hands) and back pain (lower back).  Skin: Negative.   Neurological: Negative.   Psychiatric/Behavioral: Negative.        Objective:   Physical Exam Vitals and nursing note reviewed.  Constitutional:      Appearance: Normal appearance.  HENT:     Head: Normocephalic.     Right Ear: Tympanic membrane normal.     Left Ear: Tympanic membrane normal.     Nose: Nose normal.     Mouth/Throat:     Mouth: Mucous membranes are moist.     Pharynx: Oropharynx is clear.  Eyes:     Pupils:  Pupils are equal, round, and reactive to light.  Cardiovascular:     Rate and Rhythm: Normal rate and regular rhythm.     Pulses: Normal pulses.     Heart sounds: Normal heart sounds.  Pulmonary:     Effort: Pulmonary effort is normal.     Breath sounds: Normal breath sounds.  Abdominal:     General: Abdomen is flat. Bowel sounds are normal.  Musculoskeletal:        General: Normal range of motion.     Cervical back: Normal range of motion and neck supple.  Skin:    General: Skin is warm and dry.     Capillary Refill: Capillary refill takes less than 2 seconds.  Neurological:     Mental  Status: He is alert and oriented to person, place, and time.  Psychiatric:        Behavior: Behavior normal.    BP 124/62   Pulse (!) 56   Temp 99.3 F (37.4 C) (Temporal)   Resp 20   Ht 5\' 10"  (1.778 m)   Wt 176 lb (79.8 kg)   BMI 25.25 kg/m       Assessment & Plan:  Darryl Ward comes in today with chief complaint of Medical Management of Chronic Issues   Diagnosis and orders addressed:  1. Aneurysm of abdominal vessel ([place]) Follow up with vascular surgeon for recheck of graft.  2. Mixed hyperlipidemia Low fat/low cholesterol diet. Exercise as tolerated.  3. Tobacco abuse Encourage smoking cessation.  4. Atherosclerosis of native coronary artery of native heart without angina pectoris   5. Peripheral vascular disease, unspecified ([place]) Follow up with vascular surgeon. - metoprolol succinate (TOPROL-XL) 25 MG 24 hr tablet; Take 1 tablet (25 mg total) by mouth daily.  Dispense: 90 tablet; Refill: 1 - rosuvastatin (CRESTOR) 10 MG tablet; Take 1 tablet (10 mg total) by mouth daily.  Dispense: 90 tablet; Refill: 1  6. Chronic midline low back pain without sciatica - Lidocaine (HM LIDOCAINE PATCH) 4 % PTCH; Apply 1 patch topically every 24 hours x 2 doses for 2 doses.  Dispense: 15 patch; Refill: 1   Labs pending Health Maintenance reviewed Diet and exercise encouraged  Follow up plan: 6 months   Mary-Margaret Hassell Done, FNP

## 2019-04-27 NOTE — Patient Instructions (Signed)

## 2019-05-21 ENCOUNTER — Other Ambulatory Visit: Payer: Self-pay | Admitting: Nurse Practitioner

## 2019-07-18 ENCOUNTER — Other Ambulatory Visit: Payer: Self-pay | Admitting: Nurse Practitioner

## 2019-07-19 DIAGNOSIS — R05 Cough: Secondary | ICD-10-CM | POA: Diagnosis not present

## 2019-07-19 DIAGNOSIS — R0989 Other specified symptoms and signs involving the circulatory and respiratory systems: Secondary | ICD-10-CM | POA: Diagnosis not present

## 2019-07-20 ENCOUNTER — Ambulatory Visit: Payer: Medicare Other | Admitting: Family

## 2019-09-07 DIAGNOSIS — H40033 Anatomical narrow angle, bilateral: Secondary | ICD-10-CM | POA: Diagnosis not present

## 2019-09-07 DIAGNOSIS — H04123 Dry eye syndrome of bilateral lacrimal glands: Secondary | ICD-10-CM | POA: Diagnosis not present

## 2019-09-20 DIAGNOSIS — H2511 Age-related nuclear cataract, right eye: Secondary | ICD-10-CM | POA: Diagnosis not present

## 2019-09-20 DIAGNOSIS — Z961 Presence of intraocular lens: Secondary | ICD-10-CM | POA: Diagnosis not present

## 2019-09-20 DIAGNOSIS — Z01818 Encounter for other preprocedural examination: Secondary | ICD-10-CM | POA: Diagnosis not present

## 2019-09-30 ENCOUNTER — Other Ambulatory Visit: Payer: Self-pay | Admitting: Nurse Practitioner

## 2019-10-07 DIAGNOSIS — H2511 Age-related nuclear cataract, right eye: Secondary | ICD-10-CM | POA: Diagnosis not present

## 2019-10-13 ENCOUNTER — Other Ambulatory Visit (HOSPITAL_COMMUNITY): Payer: Self-pay

## 2019-10-13 DIAGNOSIS — Z122 Encounter for screening for malignant neoplasm of respiratory organs: Secondary | ICD-10-CM

## 2019-10-13 DIAGNOSIS — Z87891 Personal history of nicotine dependence: Secondary | ICD-10-CM

## 2019-10-13 NOTE — Progress Notes (Signed)
Patient scheduled for follow-up LDCT on 11/18/2019 at 2p. Patient aware.

## 2019-10-14 DIAGNOSIS — H04123 Dry eye syndrome of bilateral lacrimal glands: Secondary | ICD-10-CM | POA: Diagnosis not present

## 2019-10-27 ENCOUNTER — Ambulatory Visit: Payer: Self-pay | Admitting: Nurse Practitioner

## 2019-10-31 DIAGNOSIS — R059 Cough, unspecified: Secondary | ICD-10-CM | POA: Diagnosis not present

## 2019-10-31 DIAGNOSIS — R0981 Nasal congestion: Secondary | ICD-10-CM | POA: Diagnosis not present

## 2019-10-31 DIAGNOSIS — R067 Sneezing: Secondary | ICD-10-CM | POA: Diagnosis not present

## 2019-11-06 ENCOUNTER — Other Ambulatory Visit: Payer: Self-pay | Admitting: Nurse Practitioner

## 2019-11-06 DIAGNOSIS — I739 Peripheral vascular disease, unspecified: Secondary | ICD-10-CM

## 2019-11-15 ENCOUNTER — Ambulatory Visit: Payer: Self-pay | Admitting: Nurse Practitioner

## 2019-11-18 ENCOUNTER — Ambulatory Visit (HOSPITAL_COMMUNITY): Payer: Medicare Other

## 2019-11-25 ENCOUNTER — Encounter: Payer: Self-pay | Admitting: Nurse Practitioner

## 2019-11-25 ENCOUNTER — Other Ambulatory Visit: Payer: Self-pay

## 2019-11-25 ENCOUNTER — Ambulatory Visit (INDEPENDENT_AMBULATORY_CARE_PROVIDER_SITE_OTHER): Payer: Medicare Other | Admitting: Nurse Practitioner

## 2019-11-25 VITALS — BP 135/69 | HR 68 | Temp 98.4°F | Resp 20 | Ht 70.0 in | Wt 180.0 lb

## 2019-11-25 DIAGNOSIS — Z23 Encounter for immunization: Secondary | ICD-10-CM

## 2019-11-25 DIAGNOSIS — Z720 Tobacco use: Secondary | ICD-10-CM

## 2019-11-25 DIAGNOSIS — I739 Peripheral vascular disease, unspecified: Secondary | ICD-10-CM

## 2019-11-25 DIAGNOSIS — E782 Mixed hyperlipidemia: Secondary | ICD-10-CM

## 2019-11-25 MED ORDER — ROSUVASTATIN CALCIUM 10 MG PO TABS: 10.0000 mg | ORAL_TABLET | Freq: Every day | ORAL | 1 refills | Status: DC

## 2019-11-25 MED ORDER — METOPROLOL SUCCINATE ER 25 MG PO TB24: 25.0000 mg | ORAL_TABLET | Freq: Every day | ORAL | 0 refills | Status: DC

## 2019-11-25 MED ORDER — CELECOXIB 200 MG PO CAPS: 200.0000 mg | ORAL_CAPSULE | Freq: Two times a day (BID) | ORAL | 0 refills | Status: DC

## 2019-11-25 NOTE — Patient Instructions (Signed)
Fat and Cholesterol Restricted Eating Plan Getting too much fat and cholesterol in your diet may cause health problems. Choosing the right foods helps keep your fat and cholesterol at normal levels. This can keep you from getting certain diseases. Your doctor may recommend an eating plan that includes:  Total fat: ______% or less of total calories a day.  Saturated fat: ______% or less of total calories a day.  Cholesterol: less than _________mg a day.  Fiber: ______g a day. What are tips for following this plan? Meal planning  At meals, divide your plate into four equal parts: ? Fill one-half of your plate with vegetables and green salads. ? Fill one-fourth of your plate with whole grains. ? Fill one-fourth of your plate with low-fat (lean) protein foods.  Eat fish that is high in omega-3 fats at least two times a week. This includes mackerel, tuna, sardines, and salmon.  Eat foods that are high in fiber, such as whole grains, beans, apples, broccoli, carrots, peas, and barley. General tips   Work with your doctor to lose weight if you need to.  Avoid: ? Foods with added sugar. ? Fried foods. ? Foods with partially hydrogenated oils.  Limit alcohol intake to no more than 1 drink a day for nonpregnant women and 2 drinks a day for men. One drink equals 12 oz of beer, 5 oz of wine, or 1 oz of hard liquor. Reading food labels  Check food labels for: ? Trans fats. ? Partially hydrogenated oils. ? Saturated fat (g) in each serving. ? Cholesterol (mg) in each serving. ? Fiber (g) in each serving.  Choose foods with healthy fats, such as: ? Monounsaturated fats. ? Polyunsaturated fats. ? Omega-3 fats.  Choose grain products that have whole grains. Look for the word "whole" as the first word in the ingredient list. Cooking  Cook foods using low-fat methods. These include baking, boiling, grilling, and broiling.  Eat more home-cooked foods. Eat at restaurants and buffets  less often.  Avoid cooking using saturated fats, such as butter, cream, palm oil, palm kernel oil, and coconut oil. Recommended foods  Fruits  All fresh, canned (in natural juice), or frozen fruits. Vegetables  Fresh or frozen vegetables (raw, steamed, roasted, or grilled). Green salads. Grains  Whole grains, such as whole wheat or whole grain breads, crackers, cereals, and pasta. Unsweetened oatmeal, bulgur, barley, quinoa, or brown rice. Corn or whole wheat flour tortillas. Meats and other protein foods  Ground beef (85% or leaner), grass-fed beef, or beef trimmed of fat. Skinless chicken or turkey. Ground chicken or turkey. Pork trimmed of fat. All fish and seafood. Egg whites. Dried beans, peas, or lentils. Unsalted nuts or seeds. Unsalted canned beans. Nut butters without added sugar or oil. Dairy  Low-fat or nonfat dairy products, such as skim or 1% milk, 2% or reduced-fat cheeses, low-fat and fat-free ricotta or cottage cheese, or plain low-fat and nonfat yogurt. Fats and oils  Tub margarine without trans fats. Light or reduced-fat mayonnaise and salad dressings. Avocado. Olive, canola, sesame, or safflower oils. The items listed above may not be a complete list of foods and beverages you can eat. Contact a dietitian for more information. Foods to avoid Fruits  Canned fruit in heavy syrup. Fruit in cream or butter sauce. Fried fruit. Vegetables  Vegetables cooked in cheese, cream, or butter sauce. Fried vegetables. Grains  White bread. White pasta. White rice. Cornbread. Bagels, pastries, and croissants. Crackers and snack foods that contain trans fat   and hydrogenated oils. Meats and other protein foods  Fatty cuts of meat. Ribs, chicken wings, bacon, sausage, bologna, salami, chitterlings, fatback, hot dogs, bratwurst, and packaged lunch meats. Liver and organ meats. Whole eggs and egg yolks. Chicken and turkey with skin. Fried meat. Dairy  Whole or 2% milk, cream,  half-and-half, and cream cheese. Whole milk cheeses. Whole-fat or sweetened yogurt. Full-fat cheeses. Nondairy creamers and whipped toppings. Processed cheese, cheese spreads, and cheese curds. Beverages  Alcohol. Sugar-sweetened drinks such as sodas, lemonade, and fruit drinks. Fats and oils  Butter, stick margarine, lard, shortening, ghee, or bacon fat. Coconut, palm kernel, and palm oils. Sweets and desserts  Corn syrup, sugars, honey, and molasses. Candy. Jam and jelly. Syrup. Sweetened cereals. Cookies, pies, cakes, donuts, muffins, and ice cream. The items listed above may not be a complete list of foods and beverages you should avoid. Contact a dietitian for more information. Summary  Choosing the right foods helps keep your fat and cholesterol at normal levels. This can keep you from getting certain diseases.  At meals, fill one-half of your plate with vegetables and green salads.  Eat high-fiber foods, like whole grains, beans, apples, carrots, peas, and barley.  Limit added sugar, saturated fats, alcohol, and fried foods. This information is not intended to replace advice given to you by your health care provider. Make sure you discuss any questions you have with your health care provider. Document Revised: 09/10/2017 Document Reviewed: 09/24/2016 Elsevier Patient Education  2020 Elsevier Inc.  

## 2019-11-25 NOTE — Progress Notes (Signed)
Subjective:    Patient ID: Darryl Ward, male    DOB: Aug 07, 1945, 74 y.o.   MRN: [id number]  Chief Complaint: Medical Management of Chronic Issues    HPI:  1. PVD (peripheral vascular disease) (HCC) Denies leg swelling or pain in legs. Denies wearing compression socks.   2. Mixed hyperlipidemia Lab Results  Component Value Date   CHOL 132 10/27/2018   HDL 39 (L) 10/27/2018   LDLCALC 75 10/27/2018   TRIG 93 10/27/2018   CHOLHDL 3.4 10/27/2018  Takes medication as prescribed. Watches diet, does not junk foods often.    3. Tobacco abuse Smokes 10 cigarettes a day. Denies trouble breathing or SOB.     Outpatient Encounter Medications as of 11/25/2019  Medication Sig  . aspirin EC 81 MG tablet Take 81 mg by mouth 3 (three) times a week.  . celecoxib (CELEBREX) 200 MG capsule TAKE 1 CAPSULE BY MOUTH TWICE A DAY  . metoprolol succinate (TOPROL-XL) 25 MG 24 hr tablet TAKE 1 TABLET BY MOUTH EVERY DAY  . rosuvastatin (CRESTOR) 10 MG tablet Take 1 tablet (10 mg total) by mouth daily.   No facility-administered encounter medications on file as of 11/25/2019.    Past Surgical History:  Procedure Laterality Date  . AORTA - BILATERAL FEMORAL ARTERY BYPASS GRAFT  01/08/2012   Procedure: AORTA BIFEMORAL BYPASS GRAFT;  Surgeon: Elam Dutch, MD;  Location: [facility] OR;  Service: Vascular;  Laterality: Bilateral;  using 18x80mm x 40cm Hemashield Gold Vascular Graft with Endarterectomy, Thombectomy and  Reimplantation of Inferior Mesenteric Artery  . COLONOSCOPY N/A 04/14/2019   Procedure: COLONOSCOPY;  Surgeon: Daneil Dolin, MD;  Location: [facility];  Service: Endoscopy;  Laterality: N/A;  9:30  . Left cataract surgery    . POLYPECTOMY  04/14/2019   Procedure: POLYPECTOMY;  Surgeon: Daneil Dolin, MD;  Location: [facility];  Service: Endoscopy;;    Family History  Problem Relation Age of Onset  . Hyperlipidemia Sister   . Heart attack Brother     New complaints: No new  complaints.   Social history: Has roommate, gets out for activities frequently.    Controlled substance contract: n/a   Review of Systems  Constitutional: Negative.   HENT: Negative.   Eyes: Negative.   Respiratory: Negative.   Cardiovascular: Negative.   Gastrointestinal: Negative.   Endocrine: Negative.   Genitourinary: Negative.   Musculoskeletal: Negative.   Skin: Negative.   Allergic/Immunologic: Negative.   Neurological: Negative.   Hematological: Negative.   Psychiatric/Behavioral: Negative.   All other systems reviewed and are negative.      Objective:   Physical Exam Vitals and nursing note reviewed.  Constitutional:      Appearance: Normal appearance.  HENT:     Head: Normocephalic and atraumatic.     Comments: bilat hearing aids    Right Ear: Tympanic membrane, ear canal and external ear normal.     Left Ear: Tympanic membrane, ear canal and external ear normal.     Nose: Nose normal.     Mouth/Throat:     Mouth: Mucous membranes are moist.     Pharynx: Oropharynx is clear.  Eyes:     Extraocular Movements: Extraocular movements intact.     Conjunctiva/sclera: Conjunctivae normal.     Pupils: Pupils are equal, round, and reactive to light.  Cardiovascular:     Rate and Rhythm: Normal rate and regular rhythm.     Pulses: Normal pulses.  Heart sounds: Normal heart sounds.  Pulmonary:     Effort: Pulmonary effort is normal.     Breath sounds: Normal breath sounds.  Abdominal:     General: Abdomen is flat. Bowel sounds are normal.     Palpations: Abdomen is soft.  Musculoskeletal:        General: Normal range of motion.     Cervical back: Normal range of motion.  Skin:    General: Skin is warm and dry.     Capillary Refill: Capillary refill takes less than 2 seconds.  Neurological:     General: No focal deficit present.     Mental Status: He is alert and oriented to person, place, and time. Mental status is at baseline.  Psychiatric:         Mood and Affect: Mood normal.        Behavior: Behavior normal.        Thought Content: Thought content normal.        Judgment: Judgment normal.    BP 135/69   Pulse 68   Temp 98.4 F (36.9 C) (Temporal)   Resp 20   Ht 5\' 10"  (1.778 m)   Wt 180 lb (81.6 kg)   SpO2 99%   BMI 25.83 kg/m        Assessment & Plan:  Darryl Ward comes in today with chief complaint of Medical Management of Chronic Issues   Diagnosis and orders addressed:  1. PVD (peripheral vascular disease) ([place]) Stay active. Wear compression socks. Do not sit or stand for long periods of time.   2. Mixed hyperlipidemia Take medication as prescribed. Stay active. Avoid fried and greasy foods.   3. Tobacco abuse Try to cut down on the number of cigarettes you smoke a day.   Meds ordered this encounter  Medications  . metoprolol succinate (TOPROL-XL) 25 MG 24 hr tablet    Sig: Take 1 tablet (25 mg total) by mouth daily.    Dispense:  90 tablet    Refill:  0    Order Specific Question:   Supervising Provider    Answer:   Caryl Pina A [id number]  . celecoxib (CELEBREX) 200 MG capsule    Sig: Take 1 capsule (200 mg total) by mouth 2 (two) times daily.    Dispense:  60 capsule    Refill:  0    Order Specific Question:   Supervising Provider    Answer:   Caryl Pina A [id number]  . rosuvastatin (CRESTOR) 10 MG tablet    Sig: Take 1 tablet (10 mg total) by mouth daily.    Dispense:  90 tablet    Refill:  1    Order Specific Question:   Supervising Provider    Answer:   Caryl Pina A [id number]    Labs pending Health Maintenance reviewed Diet and exercise encouraged  Follow up plan: Follow up in 1 year.   Mary-Margaret Hassell Done, FNP

## 2019-11-25 NOTE — Addendum Note (Signed)
Addended by: Rolena Infante on: 11/25/2019 12:13 PM   Modules accepted: Orders

## 2019-11-26 LAB — CBC WITH DIFFERENTIAL/PLATELET
Basophils Absolute: 0.1 10*3/uL (ref 0.0–0.2)
Basos: 1 %
EOS (ABSOLUTE): 0.2 10*3/uL (ref 0.0–0.4)
Eos: 2 %
Hematocrit: 43.6 % (ref 37.5–51.0)
Hemoglobin: 14 g/dL (ref 13.0–17.7)
Immature Grans (Abs): 0 10*3/uL (ref 0.0–0.1)
Immature Granulocytes: 0 %
Lymphocytes Absolute: 2 10*3/uL (ref 0.7–3.1)
Lymphs: 17 %
MCH: 30 pg (ref 26.6–33.0)
MCHC: 32.1 g/dL (ref 31.5–35.7)
MCV: 94 fL (ref 79–97)
Monocytes Absolute: 0.7 10*3/uL (ref 0.1–0.9)
Monocytes: 6 %
Neutrophils Absolute: 8.2 10*3/uL — ABNORMAL HIGH (ref 1.4–7.0)
Neutrophils: 74 %
Platelets: 176 10*3/uL (ref 150–450)
RBC: 4.66 x10E6/uL (ref 4.14–5.80)
RDW: 13.1 % (ref 11.6–15.4)
WBC: 11.3 10*3/uL — ABNORMAL HIGH (ref 3.4–10.8)

## 2019-11-26 LAB — CMP14+EGFR
ALT: 8 IU/L (ref 0–44)
AST: 15 IU/L (ref 0–40)
Albumin/Globulin Ratio: 1.5 (ref 1.2–2.2)
Albumin: 4 g/dL (ref 3.7–4.7)
Alkaline Phosphatase: 72 IU/L (ref 44–121)
BUN/Creatinine Ratio: 14 (ref 10–24)
BUN: 13 mg/dL (ref 8–27)
Bilirubin Total: 0.4 mg/dL (ref 0.0–1.2)
CO2: 21 mmol/L (ref 20–29)
Calcium: 9.3 mg/dL (ref 8.6–10.2)
Chloride: 106 mmol/L (ref 96–106)
Creatinine, Ser: 0.94 mg/dL (ref 0.76–1.27)
GFR calc Af Amer: 93 mL/min/{1.73_m2} (ref >59)
GFR calc non Af Amer: 81 mL/min/{1.73_m2} (ref >59)
Globulin, Total: 2.6 g/dL (ref 1.5–4.5)
Glucose: 77 mg/dL (ref 65–99)
Potassium: 4.8 mmol/L (ref 3.5–5.2)
Sodium: 141 mmol/L (ref 134–144)
Total Protein: 6.6 g/dL (ref 6.0–8.5)

## 2019-11-26 LAB — LIPID PANEL
Chol/HDL Ratio: 5.5 ratio — ABNORMAL HIGH (ref 0.0–5.0)
Cholesterol, Total: 198 mg/dL (ref 100–199)
HDL: 36 mg/dL — ABNORMAL LOW (ref >39)
LDL Chol Calc (NIH): 139 mg/dL — ABNORMAL HIGH (ref 0–99)
Triglycerides: 124 mg/dL (ref 0–149)
VLDL Cholesterol Cal: 23 mg/dL (ref 5–40)

## 2019-11-26 LAB — PSA: Prostate Specific Ag, Serum: 1.2 ng/mL (ref 0.0–4.0)

## 2019-11-29 ENCOUNTER — Ambulatory Visit (INDEPENDENT_AMBULATORY_CARE_PROVIDER_SITE_OTHER): Payer: Medicare Other | Admitting: *Deleted

## 2019-11-29 DIAGNOSIS — Z0000 Encounter for general adult medical examination without abnormal findings: Secondary | ICD-10-CM | POA: Diagnosis not present

## 2019-11-29 NOTE — Progress Notes (Signed)
MEDICARE ANNUAL WELLNESS VISIT  11/29/2019  Telephone Visit Disclaimer This Medicare AWV was conducted by telephone due to national recommendations for restrictions regarding the COVID-19 Pandemic (e.g. social distancing).  I verified, using two identifiers, that I am speaking with Darryl Ward or their authorized healthcare agent. I discussed the limitations, risks, security, and privacy concerns of performing an evaluation and management service by telephone and the potential availability of an in-person appointment in the future. The patient expressed understanding and agreed to proceed.  Location of Patient: home Location of Provider (nurse):office  Subjective:    Darryl Ward is a 74 y.o. male patient of [name], [place] who had a Medicare Annual Wellness Visit today via telephone. Darryl Ward is Working full time and lives with an adult companion. he has 1 children. he reports that he is not socially active and does interact with friends/family regularly. he is not physically active and enjoys watching tv..  Patient Care Team: [name], FNP as PCP - General (Family Medicine) Satira Sark, MD (Cardiology) Daneil Dolin, MD as Consulting Physician (Gastroenterology)  Advanced Directives 11/29/2019 04/14/2019 11/20/2018 01/09/2012 01/08/2012 01/03/2012  Does Patient Have a Medical Advance Directive? No No No Patient does not have advance directive;Patient would not like information Patient does not have advance directive;Patient would not like information Patient does not have advance directive;Patient would not like information  Would patient like information on creating a medical advance directive? No - Patient declined Yes (MAU/Ambulatory/Procedural Areas - Information given) No - Patient declined - - -  Pre-existing out of facility DNR order (yellow form or pink MOST form) - - - No No No    Hospital Utilization Over the Past 12 Months: # of  hospitalizations or ER visits: 0 # of surgeries: 0  Review of Systems    Patient reports that his overall health is unchanged compared to last year.  History obtained from chart review and the patient  Patient Reported Readings (BP, Pulse, CBG, Weight, etc) none  Pain Assessment Pain : No/denies pain     Current Medications & Allergies (verified) Allergies as of 11/29/2019   No Known Allergies     Medication List       Accurate as of November 29, 2019  3:25 PM. If you have any questions, ask your nurse or doctor.        aspirin EC 81 MG tablet Take 81 mg by mouth 3 (three) times a week.   celecoxib 200 MG capsule Commonly known as: CELEBREX Take 1 capsule (200 mg total) by mouth 2 (two) times daily.   metoprolol succinate 25 MG 24 hr tablet Commonly known as: TOPROL-XL Take 1 tablet (25 mg total) by mouth daily.   rosuvastatin 10 MG tablet Commonly known as: Crestor Take 1 tablet (10 mg total) by mouth daily.       History (reviewed): Past Medical History:  Diagnosis Date  . AAA (abdominal aortic aneurysm) ([place])   . Carotid artery disease (HCC)    Nonobstructive  . Cataract   . Coronary atherosclerosis of native coronary artery    PTCA small diagonal 2007 otherwise nonobstructive CAD, EF 50-55%  . Essential hypertension, benign   . Hyperlipidemia   . NSTEMI (non-ST elevated myocardial infarction) ([place]) 2007  . Stroke [facility]) 2004  . TIA (transient ischemic attack) 2006   Past Surgical History:  Procedure Laterality Date  . AORTA - BILATERAL FEMORAL ARTERY BYPASS GRAFT  01/08/2012   Procedure: AORTA BIFEMORAL  BYPASS GRAFT;  Surgeon: Elam Dutch, MD;  Location: [facility] OR;  Service: Vascular;  Laterality: Bilateral;  using 18x1mm x 40cm Hemashield Gold Vascular Graft with Endarterectomy, Thombectomy and  Reimplantation of Inferior Mesenteric Artery  . COLONOSCOPY N/A 04/14/2019   Procedure: COLONOSCOPY;  Surgeon: Daneil Dolin, MD;  [facility];  Service: Endoscopy;  Laterality: N/A;  9:30  . Left cataract surgery    . POLYPECTOMY  04/14/2019   Procedure: POLYPECTOMY;  Surgeon: Daneil Dolin, MD;  [facility];  Service: Endoscopy;;   Family History  Problem Relation Age of Onset  . Hyperlipidemia Sister   . Heart attack Brother    Social History   Socioeconomic History  . Marital status: Divorced    Spouse name: Not on file  . Number of children: 1  . Years of education: 48  . Highest education level: 11th grade  Occupational History    Employer: Probation officer  Tobacco Use  . Smoking status: Current Every Day Smoker    Packs/day: 1.00    Years: 40.00    Pack years: 40.00    Types: Cigarettes  . Smokeless tobacco: Never Used  Vaping Use  . Vaping Use: Never used  Substance and Sexual Activity  . Alcohol use: No    Comment: Prior history of regular alcohol use  . Drug use: No  . Sexual activity: Not Currently  Other Topics Concern  . Not on file  Social History Narrative  . Not on file   Social Determinants of Health   Financial Resource Strain: Low Risk   . Difficulty of Paying Living Expenses: Not hard at all  Food Insecurity: No Food Insecurity  . Worried About Charity fundraiser in the Last Year: Never true  . Ran Out of Food in the Last Year: Never true  Transportation Needs: No Transportation Needs  . Lack of Transportation (Medical): No  . Lack of Transportation (Non-Medical): No  Physical Activity: Inactive  . Days of Exercise per Week: 0 days  . Minutes of Exercise per Session: 0 min  Stress: No Stress Concern Present  . Feeling of Stress : Only a little  Social Connections: Moderately Isolated  . Frequency of Communication with Friends and Family: More than three times a week  . Frequency of Social Gatherings with Friends and Family: More than three times a week  . Attends Religious Services: 1 to 4 times per year  . Active Member of Clubs or Organizations: No  .  Attends Archivist Meetings: Never  . Marital Status: Divorced    Activities of Daily Living In your present state of health, do you have any difficulty performing the following activities: 11/29/2019 11/25/2019  Hearing? N N  Vision? N N  Difficulty concentrating or making decisions? N N  Walking or climbing stairs? N N  Dressing or bathing? N N  Doing errands, shopping? Y N  Comment does not drive Facilities manager and eating ? N -  Using the Toilet? N -  In the past six months, have you accidently leaked urine? N -  Do you have problems with loss of bowel control? N -  Managing your Medications? N -  Managing your Finances? N -  Housekeeping or managing your Housekeeping? N -  Some recent data might be hidden    Patient Education/ Literacy How often do you need to have someone help you when you read instructions, pamphlets, or other  written materials from your doctor or pharmacy?: 1 - Never What is the last grade level you completed in school?: 12  Exercise Current Exercise Habits: The patient does not participate in regular exercise at present, Exercise limited by: None identified  Diet Patient reports consuming 3 meals a day and 0 snack(s) a day Patient reports that his primary diet is: Regular Patient reports that she does have regular access to food.   Depression Screen PHQ 2/9 Scores 11/29/2019 11/25/2019 04/27/2019 11/20/2018 10/27/2018 12/02/2017 04/21/2017  PHQ - 2 Score 0 0 0 0 0 0 0     Fall Risk Fall Risk  11/29/2019 11/25/2019 04/27/2019 11/20/2018 10/27/2018  Falls in the past year? 0 0 0 0 0  Number falls in past yr: - - - 0 -  Injury with Fall? - - - 0 -     Objective:  Darryl Ward seemed alert and oriented and he participated appropriately during our telephone visit.  Blood Pressure Weight BMI  BP Readings from Last 3 Encounters:  11/25/19 135/69  04/27/19 124/62  04/14/19 136/70   Wt Readings from Last 3 Encounters:  11/25/19 180 lb (81.6  kg)  04/27/19 176 lb (79.8 kg)  04/14/19 180 lb (81.6 kg)   BMI Readings from Last 1 Encounters:  11/25/19 25.83 kg/m    *Unable to obtain current vital signs, weight, and BMI due to telephone visit type  Hearing/Vision  . Darryl Ward did not seem to have difficulty with hearing/understanding during the telephone conversation . Reports that he has had a formal eye exam by an eye care professional within the past year . Reports that he has had a formal hearing evaluation within the past year *Unable to fully assess hearing and vision during telephone visit type  Cognitive Function: 6CIT Screen 11/29/2019 11/20/2018  What Year? 0 points 0 points  What month? 0 points 0 points  What time? 0 points 0 points  Count back from 20 0 points 0 points  Months in reverse 0 points 0 points  Repeat phrase 2 points 2 points  Total Score 2 2   (Normal:0-7, Significant for Dysfunction: >8)  Normal Cognitive Function Screening: Yes   Immunization & Health Maintenance Record Immunization History  Administered Date(s) Administered  . Fluad Quad(high Dose 65+) 11/25/2019  . Influenza, High Dose Seasonal PF 11/08/2016, 11/13/2017, 10/09/2018  . Moderna SARS-COVID-2 Vaccination 03/04/2019, 04/01/2019  . Pneumococcal Conjugate-13 11/25/2019  . Pneumococcal Polysaccharide-23 10/09/2018  . Zoster Recombinat (Shingrix) 10/09/2018, 01/01/2019    Health Maintenance  Topic Date Due  . Hepatitis C Screening  Never done  . TETANUS/TDAP  Never done  . COLONOSCOPY  04/13/2029  . INFLUENZA VACCINE  Completed  . COVID-19 Vaccine  Completed  . PNA vac Low Risk Adult  Completed       Assessment  This is a routine wellness examination for Darryl Ward.  Health Maintenance: Due or Overdue Health Maintenance Due  Topic Date Due  . Hepatitis C Screening  Never done  . TETANUS/TDAP  Never done    Darryl Ward does not need a referral for Community Assistance: Care Management:   no Social  Work:    no Prescription Assistance:  no Nutrition/Diabetes Education:  no   Plan:  Personalized Goals Goals Addressed            This Visit's Progress   . DIET - INCREASE WATER INTAKE   Not on track    Try to drink 6-8 glasses of water  daily    . Increase physical activity        Personalized Health Maintenance & Screening Recommendations  Td vaccine  Lung Cancer Screening Recommended: yes (Low Dose CT Chest recommended if Age 21-80 years, 30 pack-year currently smoking OR have quit w/in past 15 years) Hepatitis C Screening recommended: yes HIV Screening recommended: no  Advanced Directives: Written information was not prepared per patient's request.  Referrals & Orders No orders of the defined types were placed in this encounter.   Follow-up Plan . Follow-up with [name], FNP as planned on 05/26/19. [name] Pt needs Td vaccine. . Pt needs Hep C test. . Pt is independent with all ADL's, he does not drive. . Pt declined any information on Advanced directives. . Pt has no healthcare concerns or questions today. . AWV mailed to pt    I have personally reviewed and noted the following in the patient's chart:   . Medical and social history . Use of alcohol, tobacco or illicit drugs  . Current medications and supplements . Functional ability and status . Nutritional status . Physical activity . Advanced directives . List of other physicians . Hospitalizations, surgeries, and ER visits in previous 12 months . Vitals . Screenings to include cognitive, depression, and falls . Referrals and appointments  In addition, I have reviewed and discussed with Darryl Ward certain preventive protocols, quality metrics, and best practice recommendations. A written personalized care plan for preventive services as well as general preventive health recommendations is available and can be mailed to the patient at his request.      Faylene Million  [place],LPN  [id number]

## 2019-12-03 ENCOUNTER — Ambulatory Visit (HOSPITAL_COMMUNITY)
Admission: RE | Admit: 2019-12-03 | Discharge: 2019-12-03 | Disposition: A | Discharge status: Home / Self Care | Payer: Medicare Other | Source: Ambulatory Visit | Attending: Nurse Practitioner | Admitting: Nurse Practitioner

## 2019-12-03 ENCOUNTER — Other Ambulatory Visit: Payer: Self-pay

## 2019-12-03 DIAGNOSIS — H04123 Dry eye syndrome of bilateral lacrimal glands: Secondary | ICD-10-CM | POA: Diagnosis not present

## 2019-12-03 DIAGNOSIS — Z122 Encounter for screening for malignant neoplasm of respiratory organs: Secondary | ICD-10-CM

## 2019-12-03 DIAGNOSIS — Z87891 Personal history of nicotine dependence: Secondary | ICD-10-CM | POA: Diagnosis not present

## 2019-12-03 DIAGNOSIS — F17210 Nicotine dependence, cigarettes, uncomplicated: Secondary | ICD-10-CM | POA: Diagnosis not present

## 2019-12-06 ENCOUNTER — Encounter (HOSPITAL_COMMUNITY): Payer: Self-pay

## 2019-12-06 NOTE — Progress Notes (Signed)
Patient notified via mail of LDCT lung cancer screening results with recommendations to follow up in 12 months. Also notified of incidental findings and need to follow up with PCP. Patient's referring provider was sent a copy of results. Results are as follows:  IMPRESSION: 1. Lung-RADS 2S, benign appearance or behavior. Continue annual screening with low-dose chest CT without contrast in 12 months. 2. The "S" modifier above refers to potentially clinically significant non lung cancer related findings. Specifically, there is aortic atherosclerosis, in addition to left main and 3 vessel coronary artery disease. Assessment for potential risk factor modification, dietary therapy or pharmacologic therapy may be warranted, if clinically indicated. 3. Mild diffuse bronchial wall thickening with moderate centrilobular and paraseptal emphysema; imaging findings suggestive of underlying COPD.  Aortic Atherosclerosis (ICD10-I70.0) and Emphysema (ICD10-J43.9).

## 2020-02-04 ENCOUNTER — Emergency Department (HOSPITAL_BASED_OUTPATIENT_CLINIC_OR_DEPARTMENT_OTHER): Payer: Medicare Other

## 2020-02-04 ENCOUNTER — Observation Stay (HOSPITAL_COMMUNITY)
Admission: EM | Admit: 2020-02-04 | Discharge: 2020-02-06 | Disposition: A | Discharge status: Home / Self Care | Payer: Medicare Other | Attending: Internal Medicine | Admitting: Internal Medicine

## 2020-02-04 ENCOUNTER — Emergency Department (HOSPITAL_COMMUNITY): Payer: Medicare Other

## 2020-02-04 ENCOUNTER — Encounter (HOSPITAL_COMMUNITY): Payer: Self-pay | Admitting: *Deleted

## 2020-02-04 ENCOUNTER — Observation Stay (HOSPITAL_COMMUNITY): Payer: Medicare Other

## 2020-02-04 ENCOUNTER — Other Ambulatory Visit: Payer: Self-pay

## 2020-02-04 DIAGNOSIS — J439 Emphysema, unspecified: Secondary | ICD-10-CM | POA: Diagnosis not present

## 2020-02-04 DIAGNOSIS — I639 Cerebral infarction, unspecified: Principal | ICD-10-CM | POA: Insufficient documentation

## 2020-02-04 DIAGNOSIS — M48061 Spinal stenosis, lumbar region without neurogenic claudication: Secondary | ICD-10-CM | POA: Diagnosis present

## 2020-02-04 DIAGNOSIS — R29702 NIHSS score 2: Secondary | ICD-10-CM | POA: Diagnosis not present

## 2020-02-04 DIAGNOSIS — G8311 Monoplegia of lower limb affecting right dominant side: Secondary | ICD-10-CM

## 2020-02-04 DIAGNOSIS — I6782 Cerebral ischemia: Secondary | ICD-10-CM | POA: Diagnosis not present

## 2020-02-04 DIAGNOSIS — Z720 Tobacco use: Secondary | ICD-10-CM

## 2020-02-04 DIAGNOSIS — Z79899 Other long term (current) drug therapy: Secondary | ICD-10-CM | POA: Diagnosis not present

## 2020-02-04 DIAGNOSIS — Z951 Presence of aortocoronary bypass graft: Secondary | ICD-10-CM | POA: Diagnosis not present

## 2020-02-04 DIAGNOSIS — E782 Mixed hyperlipidemia: Secondary | ICD-10-CM | POA: Diagnosis present

## 2020-02-04 DIAGNOSIS — F17210 Nicotine dependence, cigarettes, uncomplicated: Secondary | ICD-10-CM | POA: Insufficient documentation

## 2020-02-04 DIAGNOSIS — I2510 Atherosclerotic heart disease of native coronary artery without angina pectoris: Secondary | ICD-10-CM | POA: Diagnosis not present

## 2020-02-04 DIAGNOSIS — Z20822 Contact with and (suspected) exposure to covid-19: Secondary | ICD-10-CM | POA: Diagnosis not present

## 2020-02-04 DIAGNOSIS — I6523 Occlusion and stenosis of bilateral carotid arteries: Secondary | ICD-10-CM | POA: Diagnosis not present

## 2020-02-04 DIAGNOSIS — Z7982 Long term (current) use of aspirin: Secondary | ICD-10-CM | POA: Insufficient documentation

## 2020-02-04 DIAGNOSIS — D37030 Neoplasm of uncertain behavior of the parotid salivary glands: Secondary | ICD-10-CM | POA: Insufficient documentation

## 2020-02-04 DIAGNOSIS — R4182 Altered mental status, unspecified: Secondary | ICD-10-CM | POA: Diagnosis present

## 2020-02-04 DIAGNOSIS — R531 Weakness: Secondary | ICD-10-CM | POA: Diagnosis not present

## 2020-02-04 DIAGNOSIS — Z8673 Personal history of transient ischemic attack (TIA), and cerebral infarction without residual deficits: Secondary | ICD-10-CM | POA: Diagnosis not present

## 2020-02-04 DIAGNOSIS — I10 Essential (primary) hypertension: Secondary | ICD-10-CM | POA: Insufficient documentation

## 2020-02-04 DIAGNOSIS — D320 Benign neoplasm of cerebral meninges: Secondary | ICD-10-CM | POA: Diagnosis present

## 2020-02-04 DIAGNOSIS — H748X3 Other specified disorders of middle ear and mastoid, bilateral: Secondary | ICD-10-CM | POA: Diagnosis not present

## 2020-02-04 DIAGNOSIS — D490 Neoplasm of unspecified behavior of digestive system: Secondary | ICD-10-CM | POA: Diagnosis present

## 2020-02-04 DIAGNOSIS — I6389 Other cerebral infarction: Secondary | ICD-10-CM | POA: Diagnosis not present

## 2020-02-04 DIAGNOSIS — D420 Neoplasm of uncertain behavior of cerebral meninges: Secondary | ICD-10-CM | POA: Insufficient documentation

## 2020-02-04 DIAGNOSIS — E785 Hyperlipidemia, unspecified: Secondary | ICD-10-CM | POA: Diagnosis not present

## 2020-02-04 DIAGNOSIS — G8191 Hemiplegia, unspecified affecting right dominant side: Secondary | ICD-10-CM | POA: Diagnosis not present

## 2020-02-04 DIAGNOSIS — G319 Degenerative disease of nervous system, unspecified: Secondary | ICD-10-CM | POA: Diagnosis not present

## 2020-02-04 DIAGNOSIS — I6330 Cerebral infarction due to thrombosis of unspecified cerebral artery: Secondary | ICD-10-CM | POA: Diagnosis present

## 2020-02-04 DIAGNOSIS — R410 Disorientation, unspecified: Secondary | ICD-10-CM | POA: Diagnosis not present

## 2020-02-04 LAB — URINALYSIS, ROUTINE W REFLEX MICROSCOPIC
Bilirubin Urine: NEGATIVE
Glucose, UA: NEGATIVE mg/dL
Hgb urine dipstick: NEGATIVE
Ketones, ur: NEGATIVE mg/dL
Leukocytes,Ua: NEGATIVE
Nitrite: NEGATIVE
Protein, ur: NEGATIVE mg/dL
Specific Gravity, Urine: 1.004 — ABNORMAL LOW (ref 1.005–1.030)
pH: 6 (ref 5.0–8.0)

## 2020-02-04 LAB — ECHOCARDIOGRAM COMPLETE BUBBLE STUDY
AR max vel: 2.17 cm2
AV Area VTI: 2.01 cm2
AV Area mean vel: 1.97 cm2
AV Mean grad: 2 mmHg
AV Peak grad: 4.1 mmHg
Ao pk vel: 1.01 m/s
Area-P 1/2: 4.04 cm2
S' Lateral: 3.51 cm

## 2020-02-04 LAB — CBC
HCT: 41.3 % (ref 39.0–52.0)
Hemoglobin: 13.4 g/dL (ref 13.0–17.0)
MCH: 31.2 pg (ref 26.0–34.0)
MCHC: 32.4 g/dL (ref 30.0–36.0)
MCV: 96.3 fL (ref 80.0–100.0)
Platelets: 160 10*3/uL (ref 150–400)
RBC: 4.29 MIL/uL (ref 4.22–5.81)
RDW: 14.4 % (ref 11.5–15.5)
WBC: 8.3 10*3/uL (ref 4.0–10.5)
nRBC: 0 % (ref 0.0–0.2)

## 2020-02-04 LAB — COMPREHENSIVE METABOLIC PANEL
ALT: 10 U/L (ref 0–44)
AST: 16 U/L (ref 15–41)
Albumin: 3.6 g/dL (ref 3.5–5.0)
Alkaline Phosphatase: 56 U/L (ref 38–126)
Anion gap: 6 (ref 5–15)
BUN: 17 mg/dL (ref 8–23)
CO2: 23 mmol/L (ref 22–32)
Calcium: 8.8 mg/dL — ABNORMAL LOW (ref 8.9–10.3)
Chloride: 107 mmol/L (ref 98–111)
Creatinine, Ser: 0.82 mg/dL (ref 0.61–1.24)
GFR, Estimated: >60 mL/min (ref >60)
Glucose, Bld: 122 mg/dL — ABNORMAL HIGH (ref 70–99)
Potassium: 3.8 mmol/L (ref 3.5–5.1)
Sodium: 136 mmol/L (ref 135–145)
Total Bilirubin: 0.8 mg/dL (ref 0.3–1.2)
Total Protein: 7.2 g/dL (ref 6.5–8.1)

## 2020-02-04 LAB — DIFFERENTIAL
Abs Immature Granulocytes: 0.03 10*3/uL (ref 0.00–0.07)
Basophils Absolute: 0.1 10*3/uL (ref 0.0–0.1)
Basophils Relative: 1 %
Eosinophils Absolute: 0.1 10*3/uL (ref 0.0–0.5)
Eosinophils Relative: 1 %
Immature Granulocytes: 0 %
Lymphocytes Relative: 22 %
Lymphs Abs: 1.9 10*3/uL (ref 0.7–4.0)
Monocytes Absolute: 0.4 10*3/uL (ref 0.1–1.0)
Monocytes Relative: 5 %
Neutro Abs: 5.8 10*3/uL (ref 1.7–7.7)
Neutrophils Relative %: 71 %

## 2020-02-04 LAB — RAPID URINE DRUG SCREEN, HOSP PERFORMED
Amphetamines: NOT DETECTED
Barbiturates: NOT DETECTED
Benzodiazepines: NOT DETECTED
Cocaine: NOT DETECTED
Opiates: NOT DETECTED
Tetrahydrocannabinol: NOT DETECTED

## 2020-02-04 LAB — ETHANOL: Alcohol, Ethyl (B): <10 mg/dL (ref <10)

## 2020-02-04 LAB — PROTIME-INR
INR: 1 (ref 0.8–1.2)
Prothrombin Time: 12.8 seconds (ref 11.4–15.2)

## 2020-02-04 LAB — RESP PANEL BY RT-PCR (FLU A&B, COVID) ARPGX2
Influenza A by PCR: NEGATIVE
Influenza B by PCR: NEGATIVE
SARS Coronavirus 2 by RT PCR: NEGATIVE

## 2020-02-04 LAB — APTT: aPTT: 30 seconds (ref 24–36)

## 2020-02-04 MED ORDER — ENOXAPARIN SODIUM 40 MG/0.4ML ~~LOC~~ SOLN
40.0000 mg | SUBCUTANEOUS | Status: DC
  Administered 2020-02-04 – 2020-02-05 (×2): 40 mg via SUBCUTANEOUS
  Filled 2020-02-04 (×2): qty 0.4

## 2020-02-04 MED ORDER — ASPIRIN 325 MG PO TABS
650.0000 mg | ORAL_TABLET | Freq: Once | ORAL | Status: AC
  Administered 2020-02-04: 650 mg via ORAL
  Filled 2020-02-04: qty 2

## 2020-02-04 MED ORDER — ASPIRIN 325 MG PO TABS
325.0000 mg | ORAL_TABLET | Freq: Every day | ORAL | Status: DC
  Administered 2020-02-05 – 2020-02-06 (×2): 325 mg via ORAL
  Filled 2020-02-04 (×2): qty 1

## 2020-02-04 MED ORDER — APIXABAN 5 MG PO TABS
5.0000 mg | ORAL_TABLET | Freq: Two times a day (BID) | ORAL | Status: DC
  Filled 2020-02-04: qty 1

## 2020-02-04 MED ORDER — ENOXAPARIN SODIUM 30 MG/0.3ML ~~LOC~~ SOLN: 30.0000 mg | Freq: Two times a day (BID) | SUBCUTANEOUS | Status: DC

## 2020-02-04 MED ORDER — ACETAMINOPHEN 160 MG/5ML PO SOLN: 650.0000 mg | ORAL | Status: DC | PRN

## 2020-02-04 MED ORDER — STROKE: EARLY STAGES OF RECOVERY BOOK
Freq: Once | Status: DC
  Filled 2020-02-04: qty 1

## 2020-02-04 MED ORDER — IOHEXOL 350 MG/ML SOLN
75.0000 mL | Freq: Once | INTRAVENOUS | Status: AC | PRN
  Administered 2020-02-04: 75 mL via INTRAVENOUS

## 2020-02-04 MED ORDER — SODIUM CHLORIDE 0.9 % IV SOLN: INTRAVENOUS | Status: DC

## 2020-02-04 MED ORDER — ACETAMINOPHEN 325 MG PO TABS: 650.0000 mg | ORAL_TABLET | ORAL | Status: DC | PRN

## 2020-02-04 MED ORDER — ASPIRIN EC 81 MG PO TBEC: 81.0000 mg | DELAYED_RELEASE_TABLET | Freq: Every day | ORAL | Status: DC

## 2020-02-04 MED ORDER — ROSUVASTATIN CALCIUM 5 MG PO TABS
10.0000 mg | ORAL_TABLET | Freq: Every day | ORAL | Status: DC
  Administered 2020-02-05: 10 mg via ORAL
  Filled 2020-02-04: qty 2

## 2020-02-04 MED ORDER — ASPIRIN EC 81 MG PO TBEC: 81.0000 mg | DELAYED_RELEASE_TABLET | ORAL | Status: DC

## 2020-02-04 MED ORDER — ASPIRIN 300 MG RE SUPP: 300.0000 mg | Freq: Every day | RECTAL | Status: DC

## 2020-02-04 MED ORDER — ACETAMINOPHEN 650 MG RE SUPP: 650.0000 mg | RECTAL | Status: DC | PRN

## 2020-02-04 MED ORDER — SENNOSIDES-DOCUSATE SODIUM 8.6-50 MG PO TABS: 1.0000 | ORAL_TABLET | Freq: Every evening | ORAL | Status: DC | PRN

## 2020-02-04 MED ORDER — ASPIRIN 325 MG PO TABS: 325.0000 mg | ORAL_TABLET | Freq: Every day | ORAL | Status: DC

## 2020-02-04 NOTE — ED Notes (Signed)
Pt alert and oriented to month, name, and age.

## 2020-02-04 NOTE — H&P (Signed)
History and Physical  MACHAEL RAINE UUV:[id number] DOB: 1945-05-24 DOA: 02/04/2020   PCP: [name], FNP   Patient coming from: Home  Chief Complaint: right leg weakness  HPI:  Darryl Ward is a 75 y.o. male with medical history of coronary artery disease, hypertension, hyperlipidemia, and stroke presenting with right-sided leg weakness and gait instability when he woke up to go to the bathroom in the morning of 02/04/2020. The patient went to bed around 9 PM on 02/03/2020 and was normal at that time. Patient denied any headache, fevers, chills, visual disturbance, dysarthria, word finding difficulty, or dysesthesias. He subsequently asked his friend to bring him to the emergency department with private vehicle, arriving around 11:30 AM. The patient denies any fevers, chills, chest pain, shortness breath, nausea, vomiting, diarrhea, abdominal pain, dysuria, hematuria. He states that he usually takes aspirin 81 mg daily, but he usually misses at least two doses per week. In the emergency department, the patient states that his right leg weakness is a little bit better. He has no new deficits. He was afebrile and hemodynamically stable with oxygen saturation 100% on room air. BMP, LFTs, and CBC were unremarkable. Notably, the patient had a history of daily alcohol use, but he quit drinking in 2008. He continues to smoke 1/2 pack/day.  Assessment/Plan: Right hemiparesis -Appreciate Neurology Consult-->Dr. Cheral Marker recommended transfer to [facility] for work up -PT/OT evaluation -Speech therapy eval -CT brain--neg for acute finding -MRI brain-- -MRA brain-- -CTA H&N--results pending -Echo-- -LDL-- -HbA1C-- -Antiplatelet--ASA 81 mg daily  Essential hypertension -Holding metoprolol to allow for permissive hypertension  Tobacco abuse -Tobacco cessation discussed -He has a nearly 30-pack-year history  Hyperlipidemia -Continue Crestor -Lipid panel  Coronary artery  disease with history of MI -No chest pain presently -EKG without concerning ischemic changes          Past Medical History:  Diagnosis Date  . AAA (abdominal aortic aneurysm) ([place])   . Carotid artery disease (HCC)    Nonobstructive  . Cataract   . Coronary atherosclerosis of native coronary artery    PTCA small diagonal 2007 otherwise nonobstructive CAD, EF 50-55%  . Essential hypertension, benign   . Hyperlipidemia   . NSTEMI (non-ST elevated myocardial infarction) ([place]) 2007  . Stroke [facility]) 2004  . TIA (transient ischemic attack) 2006   Past Surgical History:  Procedure Laterality Date  . AORTA - BILATERAL FEMORAL ARTERY BYPASS GRAFT  01/08/2012   Procedure: AORTA BIFEMORAL BYPASS GRAFT;  Surgeon: Elam Dutch, MD;  Location: [facility] OR;  Service: Vascular;  Laterality: Bilateral;  using 18x33mm x 40cm Hemashield Gold Vascular Graft with Endarterectomy, Thombectomy and  Reimplantation of Inferior Mesenteric Artery  . COLONOSCOPY N/A 04/14/2019   Procedure: COLONOSCOPY;  Surgeon: Daneil Dolin, MD;  [facility];  Service: Endoscopy;  Laterality: N/A;  9:30  . Left cataract surgery    . POLYPECTOMY  04/14/2019   Procedure: POLYPECTOMY;  Surgeon: Daneil Dolin, MD;  Location: [facility];  Service: Endoscopy;;   Social History:  reports that he has been smoking cigarettes. He has a 40.00 pack-year smoking history. He has never used smokeless tobacco. He reports that he does not drink alcohol and does not use drugs.   Family History  Problem Relation Age of Onset  . Hyperlipidemia Sister   . Heart attack Brother      No Known Allergies   Prior to Admission medications   Medication Sig Start Date End  Date Taking? Authorizing Provider  aspirin EC 81 MG tablet Take 81 mg by mouth 3 (three) times a week.   Yes [provider]  celecoxib (CELEBREX) 200 MG capsule Take 1 capsule (200 mg total) by mouth 2 (two) times daily. 11/25/19  Yes Hassell Done,  Mary-Margaret, FNP  metoprolol succinate (TOPROL-XL) 25 MG 24 hr tablet Take 1 tablet (25 mg total) by mouth daily. 11/25/19  Yes Martin, Mary-Margaret, FNP  rosuvastatin (CRESTOR) 10 MG tablet Take 1 tablet (10 mg total) by mouth daily. 11/25/19  Yes Martin, Mary-Margaret, FNP    Review of Systems:  Constitutional:  No weight loss, night sweats, Fevers, chills, fatigue.  Head&Eyes: No headache.  No vision loss.  No eye pain or scotoma ENT:  No Difficulty swallowing,Tooth/dental problems,Sore throat,  No ear ache, post nasal drip,  Cardio-vascular:  No chest pain, Orthopnea, PND, swelling in lower extremities,  dizziness, palpitations  GI:  No  abdominal pain, nausea, vomiting, diarrhea, loss of appetite, hematochezia, melena, heartburn, indigestion, Resp:  No shortness of breath with exertion or at rest. No cough. No coughing up of blood .No wheezing.No chest wall deformity  Skin:  no rash or lesions.  GU:  no dysuria, change in color of urine, no urgency or frequency. No flank pain.  Musculoskeletal:  No joint pain or swelling. No decreased range of motion. No back pain.  Psych:  No change in mood or affect. No depression or anxiety. Neurologic: No headache, no dysesthesia, no vision loss. No syncope  Physical Exam: Vitals:   02/04/20 1315 02/04/20 1330 02/04/20 1400 02/04/20 1430  BP:  (!) 167/77 (!) 162/75 (!) 165/82  Pulse: 60 (!) 54 (!) 54 63  Resp: 17 16 17 19   SpO2: 99% 99% 100% 100%  Weight:      Height:       General:  A&O x 3, NAD, nontoxic, pleasant/cooperative Head/Eye: No conjunctival hemorrhage, no icterus, [place]/AT, No nystagmus ENT:  No icterus,  No thrush, good dentition, no pharyngeal exudate Neck:  No masses, no lymphadenpathy, no bruits CV:  RRR, no rub, no gallop, no S3 Lung:  Bibasilar rales. No wheeze Abdomen: soft/NT, +BS, nondistended, no peritoneal signs Ext: No cyanosis, No rashes, No petechiae, No lymphangitis, No edema Neuro: CNII-XII intact,  strength 4/5 in bilateral upper and lower extremities, no dysmetria  Labs on Admission:  Basic Metabolic Panel: Recent Labs  Lab 02/04/20 1224  NA 136  K 3.8  CL 107  CO2 23  GLUCOSE 122*  BUN 17  CREATININE 0.82  CALCIUM 8.8*   Liver Function Tests: Recent Labs  Lab 02/04/20 1224  AST 16  ALT 10  ALKPHOS 56  BILITOT 0.8  PROT 7.2  ALBUMIN 3.6   No results for input(s): LIPASE, AMYLASE in the last 168 hours. No results for input(s): AMMONIA in the last 168 hours. CBC: Recent Labs  Lab 02/04/20 1224  WBC 8.3  NEUTROABS 5.8  HGB 13.4  HCT 41.3  MCV 96.3  PLT 160   Coagulation Profile: Recent Labs  Lab 02/04/20 1224  INR 1.0   Cardiac Enzymes: No results for input(s): CKTOTAL, CKMB, CKMBINDEX, TROPONINI in the last 168 hours. BNP: Invalid input(s): POCBNP CBG: No results for input(s): GLUCAP in the last 168 hours. Urine analysis:    Component Value Date/Time   COLORURINE YELLOW 01/10/2012 1728   APPEARANCEUR CLEAR 01/10/2012 1728   LABSPEC 1.017 01/10/2012 1728   PHURINE 7.0 01/10/2012 1728   GLUCOSEU NEGATIVE 01/10/2012 1728  HGBUR MODERATE (A) 01/10/2012 1728   BILIRUBINUR NEGATIVE 01/10/2012 1728   KETONESUR NEGATIVE 01/10/2012 1728   PROTEINUR 30 (A) 01/10/2012 1728   UROBILINOGEN 1.0 01/10/2012 1728   NITRITE NEGATIVE 01/10/2012 1728   LEUKOCYTESUR TRACE (A) 01/10/2012 1728   Sepsis Labs: @LABRCNTIP (procalcitonin:4,lacticidven:4) ) Recent Results (from the past 240 hour(s))  Resp Panel by RT-PCR (Flu A&B, Covid) Nasopharyngeal Swab     Status: None   Collection Time: 02/04/20  1:25 PM   Specimen: Nasopharyngeal Swab; Nasopharyngeal(NP) swabs in vial transport medium  Result Value Ref Range Status   SARS Coronavirus 2 by RT PCR NEGATIVE NEGATIVE Final    Comment: (NOTE) SARS-CoV-2 target nucleic acids are NOT DETECTED.  The SARS-CoV-2 RNA is generally detectable in upper respiratory specimens during the acute phase of infection. The  lowest concentration of SARS-CoV-2 viral copies this assay can detect is 138 copies/mL. A negative result does not preclude SARS-Cov-2 infection and should not be used as the sole basis for treatment or other patient management decisions. A negative result may occur with  improper specimen collection/handling, submission of specimen other than nasopharyngeal swab, presence of viral mutation(s) within the areas targeted by this assay, and inadequate number of viral copies(<138 copies/mL). A negative result must be combined with clinical observations, patient history, and epidemiological information. The expected result is Negative.  Fact Sheet for Patients:  EntrepreneurPulse.com.au  Fact Sheet for Healthcare Providers:  IncredibleEmployment.be  This test is no t yet approved or cleared by the [place] FDA and  has been authorized for detection and/or diagnosis of SARS-CoV-2 by FDA under an Emergency Use Authorization (EUA). This EUA will remain  in effect (meaning this test can be used) for the duration of the COVID-19 declaration under Section 564(b)(1) of the Act, 21 U.S.C.section 360bbb-3(b)(1), unless the authorization is terminated  or revoked sooner.       Influenza A by PCR NEGATIVE NEGATIVE Final   Influenza B by PCR NEGATIVE NEGATIVE Final    Comment: (NOTE) The Xpert Xpress SARS-CoV-2/FLU/RSV plus assay is intended as an aid in the diagnosis of influenza from Nasopharyngeal swab specimens and should not be used as a sole basis for treatment. Nasal washings and aspirates are unacceptable for Xpert Xpress SARS-CoV-2/FLU/RSV testing.  Fact Sheet for Patients: EntrepreneurPulse.com.au  Fact Sheet for Healthcare Providers: IncredibleEmployment.be  This test is not yet approved or cleared by the [place] FDA and has been authorized for detection and/or diagnosis of SARS-CoV-2 by FDA under  an Emergency Use Authorization (EUA). This EUA will remain in effect (meaning this test can be used) for the duration of the COVID-19 declaration under Section 564(b)(1) of the Act, 21 U.S.C. section 360bbb-3(b)(1), unless the authorization is terminated or revoked.  Performed at [facility], [address], [place], [place] [address]      Radiological Exams on Admission: CT HEAD CODE STROKE WO CONTRAST  Result Date: 02/04/2020 CLINICAL DATA:  Code stroke. Confusion, unsteady gait, dragging right foot EXAM: CT HEAD WITHOUT CONTRAST TECHNIQUE: Contiguous axial images were obtained from the base of the skull through the vertex without intravenous contrast. COMPARISON:  None. FINDINGS: Brain: There is no acute intracranial hemorrhage, mass effect, or edema. Gray-white differentiation is preserved. There are chronic appearing small vessel infarcts the basal ganglia bilaterally as well as the central left frontal white matter. Additional patchy confluent areas of hypoattenuation in the supratentorial white matter are nonspecific probably reflect mild to moderate chronic microvascular ischemic changes. Prominence of the ventricles and sulci  reflects parenchymal volume loss. There is extra-axial hyperdensity along the right sphenoid wing and a meningioma is suspected (series 3, image 9). Vascular: No hyperdense vessel. There is intracranial atherosclerotic calcification at the skull base. Skull: Intact. Sinuses/Orbits: No acute abnormality. Other: Bilateral mastoid opacification. ASPECTS ([place] Stroke Program Early CT Score) - Ganglionic level infarction (caudate, lentiform nuclei, internal capsule, insula, M1-M3 cortex): 7 - Supraganglionic infarction (M4-M6 cortex): 3 Total score (0-10 with 10 being normal): 10 IMPRESSION: There is no acute intracranial hemorrhage or evidence of acute infarction. ASPECT score is 10. Chronic appearing small vessel infarcts of the basal ganglia bilaterally and deep left  cerebral white matter. Chronic microvascular ischemic changes. Suspected small meningioma along the right sphenoid wing. MRI could be obtained for confirmation. These results were called by telephone at the time of interpretation on 02/04/2020 at 1:07 pm to provider [facility] ZAMMIT , who verbally acknowledged these results. Electronically Signed   By: Macy Mis M.D.   On: 02/04/2020 13:12    EKG: Independently reviewed. Sinus, nonspecific ST changes    Time spent:60 minutes Code Status:   FULL Family Communication:  No Family at bedside Disposition Plan: expect 1-2 day hospitalization Consults called: neurology DVT Prophylaxis: [place] Lovenox  Orson Eva, DO  [facility] Pager [phone]  If 7PM-7AM, please contact night-coverage [email or web address] Password [facility] 02/04/2020, 3:04 PM

## 2020-02-04 NOTE — Progress Notes (Signed)
1247 call time 1252 exam started 1254 exam finished 1254 images sent to soc 1257 exam completed in epic 3016 [facility] called

## 2020-02-04 NOTE — ED Triage Notes (Signed)
Pt woke up with confusion at 0600 this morning, went to bed normal at 2100 last night.  Pt denies any HA or slurred speech.  Pt with unsteady gait after waking up this morning.

## 2020-02-04 NOTE — Progress Notes (Signed)
*  PRELIMINARY RESULTS* Echocardiogram 2D Echocardiogram with bubble study has been performed.  Leavy Cella 02/04/2020, 4:04 PM

## 2020-02-04 NOTE — ED Provider Notes (Signed)
[facility] EMERGENCY DEPARTMENT Provider Note   CSN: [id number] Arrival date & time: 02/04/20  1209  An emergency department physician performed an initial assessment on this suspected stroke patient at 1246.  History Chief Complaint  Patient presents with  . Altered Mental Status    Darryl Ward is a 75 y.o. male.  Patient woke up with some dizziness and weakness in his right leg.  The history is provided by the patient and medical records. No language interpreter was used.  Weakness Severity:  Moderate Onset quality:  Sudden Timing:  Constant Progression:  Unchanged Chronicity:  New Context: not alcohol use   Relieved by:  Nothing Worsened by:  Nothing Ineffective treatments:  None tried Associated symptoms: no abdominal pain, no chest pain, no cough, no diarrhea, no frequency, no headaches and no seizures   Risk factors: no anemia        Past Medical History:  Diagnosis Date  . AAA (abdominal aortic aneurysm) ([place])   . Carotid artery disease (HCC)    Nonobstructive  . Cataract   . Coronary atherosclerosis of native coronary artery    PTCA small diagonal 2007 otherwise nonobstructive CAD, EF 50-55%  . Essential hypertension, benign   . Hyperlipidemia   . NSTEMI (non-ST elevated myocardial infarction) ([place]) 2007  . Stroke [facility]) 2004  . TIA (transient ischemic attack) 2006    Patient Active Problem List   Diagnosis Date Noted  . Chronic midline low back pain without sciatica 04/27/2019  . Tobacco abuse 11/17/2018  . PVD (peripheral vascular disease) ([place]) 05/21/2012  . Atherosclerosis of native arteries of the extremities with ulceration ([place]) 01/23/2012  . Mixed hyperlipidemia 12/13/2011  . Coronary atherosclerosis of native coronary artery 12/13/2011  . Aneurysm of abdominal vessel ([place]) 12/12/2011    Past Surgical History:  Procedure Laterality Date  . AORTA - BILATERAL FEMORAL ARTERY BYPASS GRAFT  01/08/2012   Procedure: AORTA BIFEMORAL BYPASS  GRAFT;  Surgeon: Elam Dutch, MD;  Location: [facility] OR;  Service: Vascular;  Laterality: Bilateral;  using 18x52mm x 40cm Hemashield Gold Vascular Graft with Endarterectomy, Thombectomy and  Reimplantation of Inferior Mesenteric Artery  . COLONOSCOPY N/A 04/14/2019   Procedure: COLONOSCOPY;  Surgeon: Daneil Dolin, MD;  [facility];  Service: Endoscopy;  Laterality: N/A;  9:30  . Left cataract surgery    . POLYPECTOMY  04/14/2019   Procedure: POLYPECTOMY;  Surgeon: Daneil Dolin, MD;  [facility];  Service: Endoscopy;;       Family History  Problem Relation Age of Onset  . Hyperlipidemia Sister   . Heart attack Brother     Social History   Tobacco Use  . Smoking status: Current Every Day Smoker    Packs/day: 1.00    Years: 40.00    Pack years: 40.00    Types: Cigarettes  . Smokeless tobacco: Never Used  Vaping Use  . Vaping Use: Never used  Substance Use Topics  . Alcohol use: No    Comment: Prior history of regular alcohol use  . Drug use: No    Home Medications Prior to Admission medications   Medication Sig Start Date End Date Taking? Authorizing Provider  aspirin EC 81 MG tablet Take 81 mg by mouth 3 (three) times a week.   Yes [provider]  celecoxib (CELEBREX) 200 MG capsule Take 1 capsule (200 mg total) by mouth 2 (two) times daily. 11/25/19  Yes Hassell Done, Mary-Margaret, FNP  metoprolol succinate (TOPROL-XL) 25 MG  24 hr tablet Take 1 tablet (25 mg total) by mouth daily. 11/25/19  Yes Martin, Mary-Margaret, FNP  rosuvastatin (CRESTOR) 10 MG tablet Take 1 tablet (10 mg total) by mouth daily. 11/25/19  Yes Hassell Done Mary-Margaret, FNP    Allergies    Patient has no known allergies.  Review of Systems   Review of Systems  Constitutional: Negative for appetite change and fatigue.  HENT: Negative for congestion, ear discharge and sinus pressure.   Eyes: Negative for discharge.  Respiratory: Negative for cough.   Cardiovascular:  Negative for chest pain.  Gastrointestinal: Negative for abdominal pain and diarrhea.  Genitourinary: Negative for frequency and hematuria.  Musculoskeletal: Negative for back pain.  Skin: Negative for rash.  Neurological: Positive for weakness. Negative for seizures and headaches.       Right leg weakness  Psychiatric/Behavioral: Negative for hallucinations.    Physical Exam Updated Vital Signs BP (!) 165/82   Pulse 63   Resp 19   Ht 5\' 10"  (1.778 m)   Wt 81.6 kg   SpO2 100%   BMI 25.83 kg/m   Physical Exam Vitals reviewed.  Constitutional:      Appearance: He is well-developed.  HENT:     Head: Normocephalic.     Nose: Nose normal.  Eyes:     General: No scleral icterus.    Extraocular Movements: EOM normal.     Conjunctiva/sclera: Conjunctivae normal.  Neck:     Thyroid: No thyromegaly.  Cardiovascular:     Rate and Rhythm: Normal rate and regular rhythm.     Heart sounds: No murmur heard. No friction rub. No gallop.   Pulmonary:     Breath sounds: No stridor. No wheezing or rales.  Chest:     Chest wall: No tenderness.  Abdominal:     General: There is no distension.     Tenderness: There is no abdominal tenderness. There is no rebound.  Musculoskeletal:        General: No edema. Normal range of motion.     Cervical back: Neck supple.     Comments: Patient has weakness to the right leg but is able to ambulate  Lymphadenopathy:     Cervical: No cervical adenopathy.  Skin:    Findings: No erythema or rash.  Neurological:     Mental Status: He is alert and oriented to person, place, and time.     Motor: No abnormal muscle tone.     Coordination: Coordination normal.  Psychiatric:        Mood and Affect: Mood and affect normal.        Behavior: Behavior normal.     ED Results / Procedures / Treatments   Labs (all labs ordered are listed, but only abnormal results are displayed) Labs Reviewed  COMPREHENSIVE METABOLIC PANEL - Abnormal; Notable for the  following components:      Result Value   Glucose, Bld 122 (*)    Calcium 8.8 (*)    All other components within normal limits  RESP PANEL BY RT-PCR (FLU A&B, COVID) ARPGX2  ETHANOL  PROTIME-INR  APTT  CBC  DIFFERENTIAL  RAPID URINE DRUG SCREEN, HOSP PERFORMED  URINALYSIS, ROUTINE W REFLEX MICROSCOPIC  I-STAT CHEM 8, ED    EKG None  Radiology CT HEAD CODE STROKE WO CONTRAST  Result Date: 02/04/2020 CLINICAL DATA:  Code stroke. Confusion, unsteady gait, dragging right foot EXAM: CT HEAD WITHOUT CONTRAST TECHNIQUE: Contiguous axial images were obtained from the base of the skull through  the vertex without intravenous contrast. COMPARISON:  None. FINDINGS: Brain: There is no acute intracranial hemorrhage, mass effect, or edema. Gray-white differentiation is preserved. There are chronic appearing small vessel infarcts the basal ganglia bilaterally as well as the central left frontal white matter. Additional patchy confluent areas of hypoattenuation in the supratentorial white matter are nonspecific probably reflect mild to moderate chronic microvascular ischemic changes. Prominence of the ventricles and sulci reflects parenchymal volume loss. There is extra-axial hyperdensity along the right sphenoid wing and a meningioma is suspected (series 3, image 9). Vascular: No hyperdense vessel. There is intracranial atherosclerotic calcification at the skull base. Skull: Intact. Sinuses/Orbits: No acute abnormality. Other: Bilateral mastoid opacification. ASPECTS ([place] Stroke Program Early CT Score) - Ganglionic level infarction (caudate, lentiform nuclei, internal capsule, insula, M1-M3 cortex): 7 - Supraganglionic infarction (M4-M6 cortex): 3 Total score (0-10 with 10 being normal): 10 IMPRESSION: There is no acute intracranial hemorrhage or evidence of acute infarction. ASPECT score is 10. Chronic appearing small vessel infarcts of the basal ganglia bilaterally and deep left cerebral white matter.  Chronic microvascular ischemic changes. Suspected small meningioma along the right sphenoid wing. MRI could be obtained for confirmation. These results were called by telephone at the time of interpretation on 02/04/2020 at 1:07 pm to provider [facility] Jamichael Knotts , who verbally acknowledged these results. Electronically Signed   By: Macy Mis M.D.   On: 02/04/2020 13:12    Procedures Procedures (including critical care time)  Medications Ordered in ED Medications  aspirin EC tablet 81 mg (has no administration in time range)  aspirin tablet 650 mg (650 mg Oral Given 02/04/20 1343)  iohexol (OMNIPAQUE) 350 MG/ML injection 75 mL (75 mLs Intravenous Contrast Given 02/04/20 1444)    ED Course  I have reviewed the triage vital signs and the nursing notes.  Pertinent labs & imaging results that were available during my care of the patient were reviewed by me and considered in my medical decision making (see chart for details).    CRITICAL CARE Performed by: Milton Ferguson Total critical care time: 45 minutes Critical care time was exclusive of separately billable procedures and treating other patients. Critical care was necessary to treat or prevent imminent or life-threatening deterioration. Critical care was time spent personally by me on the following activities: development of treatment plan with patient and/or surrogate as well as nursing, discussions with consultants, evaluation of patient's response to treatment, examination of patient, obtaining history from patient or surrogate, ordering and performing treatments and interventions, ordering and review of laboratory studies, ordering and review of radiographic studies, pulse oximetry and re-evaluation of patient's condition. Patient was seen by neurology and it was felt the patient had a stroke.  He will get a CTA of the head and neck along with an MRI.  He will be admitted to [facility] by the hospitalist service MDM Rules/Calculators/A&P                           Weakness in the right leg with some dizziness secondary to stroke.  Patient will be admitted to [facility] with neuro consult Final Clinical Impression(s) / ED Diagnoses Final diagnoses:  Acute stroke due to ischemia [facility])    Rx / DC Orders ED Discharge Orders    None       Milton Ferguson, MD 02/05/20 [phone]

## 2020-02-04 NOTE — Consult Note (Signed)
[facility] TeleNeurology Consult Services    Date of Service:  02/04/20    Impression: Stroke    Comments/Sign-Out: .75 year old male with a PMHx of stroke, HLD, HTN and CAD who presented to the [facility] after waking up with confusion and unsteady gait at 0600. LKN was 2100 when he went to bed. He denies vision changes, dysarthria, dysphasia, facial droop, headache, CP, SOB and incoordination. He states that only his RLE is weak; his RUE has normal strength. Also endorses RLE sensory numbness. NIHSS of 2. Not a candidate for tPA due to being out of the time window. Not a candidate for thrombectomy as presentation not consistent with LVO. Will need transfer to [facility] for stroke work up.    Metrics: Last Known Well: 2100  Symptoms: As per HPI.  Patient is not a candidate for thrombolytic: Out of the time window   CT head was reviewed:  - There is no acute intracranial hemorrhage or evidence of acute infarction. ASPECT score is 10. - Chronic appearing small vessel infarcts of the basal ganglia bilaterally and deep left cerebral white matter. - Chronic microvascular ischemic changes. - Suspected small meningioma along the right sphenoid wing. MRI could be obtained for confirmation.   ED Physician notified of diagnostic impression and management plan at the time of this consultation   Recommendations:     .  Activate Stroke Protocol Admission/Order Set     .  Stroke/Telemetry Floor     .  Neuro Checks     .  Will need to initiate Aspirin 81 MG Daily; not to take only intermittently as he was doing prior to this assessment. Also administering 650 mg crushed ASA x 1 now.      .  Initiate Atorvastatin 40 MG qhs. Obtain baseline CK level.      [name]  MRI Brain     .  CTA head and neck     .  Please allow for permissive HTN for 24 hours. Modified permissive HTN SBP cutoff of 180 due to his age. Can use Labetalol 200 MG po q6h prn for    SBP>180 or DBP>110 (goal 15% reduction in first 24  hours)     .  Maximize blood glucose control with insulin coverage (goal FS 60-180)     .  Please maintain euthermia. Tylenol prn for temp >100.4     .  Normal Saline IV at 75 mL/hr     .  Transthoracic Echo      .  Telemetry monitoring     .  Lipid Profile, A1C     .  PT/OT, Speech/Swallow evaluation     .  Dysphagia screen before starting PO diet     .  SCDs for DVT Prophylaxis     .  Routine Consultation with [facility] for Follow up Care     ------------------------------------------------------------------------------   History of Present Illness: 75 year old male with a PMHx of stroke, HLD and CAD who presented to the [facility] after waking up with confusion and unsteady gait at 0600. LKN was 2100 when he went to bed. He denies vision changes, dysarthria, dysphasia, facial droop, headache, CP, SOB and incoordination. He states that only his RLE is weak; his RUE has normal strength. Also endorses RLE sensory numbness.   He has a prior history of stroke in 2008 without residual deficits. He can no longer remember which side had been weak at that time.  He takes  ASA 81 mg intermittently, about 2-3 times per week..      Past Medical History:  Diagnosis Date  . AAA (abdominal aortic aneurysm) ([place])   . Carotid artery disease (H[facility])    Nonobstructive  . Cataract   . Coronary atherosclerosis of native coronary artery    PTCA small diagonal 2007 otherwise nonobstructive CAD, EF 50-55%  . Essential hypertension, benign   . Hyperlipidemia   . NSTEMI (non-ST elevated myocardial infarction) ([place]) 2007  . Stroke [facility]) 2004  . TIA (transient ischemic attack) 2006      Family History:  Reviewed in Epic   ROS:  As per HPI   Anticoagulant use:  No     Antiplatelet use: Aspirin 2-3 times per week   Examination: BP (!) 162/75   Pulse (!) 54   Resp 17   Ht 5\' 10"  (1.778 m)   Wt 81.6 kg   SpO2 100%   BMI 25.83 kg/m  1A: Level of Consciousness - 0 1B: Ask Month and Age -  0 1C: Blink Eyes & Squeeze Hands - 0 2: Test Horizontal Extraocular Movements - 0 3: Test Visual Fields - 0 4: Test Facial Palsy (Use Grimace if Obtunded) - 0 5A: Test Left Arm Motor Drift - 0 5B: Test Right Arm Motor Drift - 0 6A: Test Left Leg Motor Drift - 0 6B: Test Right Leg Motor Drift - 1 7: Test Limb Ataxia (FNF/Heel-Shin) - 0 8: Test Sensation -  1 9: Test Language/Aphasia - 0 10: Test Dysarthria - Severe Dysarthria: 0 11: Test Extinction/Inattention - Extinction to bilateral simultaneous stimulation 0   NIHSS Score: 2    Patient/Family was informed the Neurology Consult would occur via TeleHealth consult by way of interactive audio and video telecommunications and consented to receiving care in this manner.   Patient is being evaluated for possible acute neurologic impairment and high pretest probability of imminent or life-threatening deterioration. I spent total of 50 minutes providing care to this patient, including time for face to face visit via telemedicine, review of medical records, imaging studies and discussion of findings with providers, the patient and/or family.  Electronically signed: Dr. Kerney Elbe

## 2020-02-05 ENCOUNTER — Observation Stay (HOSPITAL_BASED_OUTPATIENT_CLINIC_OR_DEPARTMENT_OTHER): Payer: Medicare Other

## 2020-02-05 ENCOUNTER — Observation Stay (HOSPITAL_COMMUNITY): Payer: Medicare Other

## 2020-02-05 DIAGNOSIS — E041 Nontoxic single thyroid nodule: Secondary | ICD-10-CM | POA: Diagnosis not present

## 2020-02-05 DIAGNOSIS — I6330 Cerebral infarction due to thrombosis of unspecified cerebral artery: Secondary | ICD-10-CM | POA: Diagnosis not present

## 2020-02-05 DIAGNOSIS — G8191 Hemiplegia, unspecified affecting right dominant side: Secondary | ICD-10-CM

## 2020-02-05 DIAGNOSIS — I6389 Other cerebral infarction: Secondary | ICD-10-CM | POA: Diagnosis not present

## 2020-02-05 DIAGNOSIS — M48061 Spinal stenosis, lumbar region without neurogenic claudication: Secondary | ICD-10-CM | POA: Diagnosis not present

## 2020-02-05 DIAGNOSIS — D490 Neoplasm of unspecified behavior of digestive system: Secondary | ICD-10-CM | POA: Diagnosis present

## 2020-02-05 DIAGNOSIS — D329 Benign neoplasm of meninges, unspecified: Secondary | ICD-10-CM | POA: Diagnosis not present

## 2020-02-05 DIAGNOSIS — D320 Benign neoplasm of cerebral meninges: Secondary | ICD-10-CM | POA: Diagnosis present

## 2020-02-05 DIAGNOSIS — E782 Mixed hyperlipidemia: Secondary | ICD-10-CM

## 2020-02-05 DIAGNOSIS — R220 Localized swelling, mass and lump, head: Secondary | ICD-10-CM | POA: Diagnosis not present

## 2020-02-05 DIAGNOSIS — M4807 Spinal stenosis, lumbosacral region: Secondary | ICD-10-CM | POA: Diagnosis not present

## 2020-02-05 LAB — HEMOGLOBIN A1C
Hgb A1c MFr Bld: 5.1 % (ref 4.8–5.6)
Mean Plasma Glucose: 99.67 mg/dL

## 2020-02-05 LAB — ECHOCARDIOGRAM COMPLETE
Area-P 1/2: 3.33 cm2
Height: 70 in
S' Lateral: 3.5 cm
Single Plane A4C EF: 55.9 %
Weight: 2880 oz

## 2020-02-05 LAB — LIPID PANEL
Cholesterol: 202 mg/dL — ABNORMAL HIGH (ref 0–200)
HDL: 29 mg/dL — ABNORMAL LOW (ref >40)
LDL Cholesterol: 153 mg/dL — ABNORMAL HIGH (ref 0–99)
Total CHOL/HDL Ratio: 7 RATIO
Triglycerides: 99 mg/dL (ref <150)
VLDL: 20 mg/dL (ref 0–40)

## 2020-02-05 MED ORDER — GADOBUTROL 1 MMOL/ML IV SOLN
8.5000 mL | Freq: Once | INTRAVENOUS | Status: AC | PRN
  Administered 2020-02-05: 8.5 mL via INTRAVENOUS

## 2020-02-05 MED ORDER — ATORVASTATIN CALCIUM 80 MG PO TABS
80.0000 mg | ORAL_TABLET | Freq: Every day | ORAL | Status: DC
  Administered 2020-02-05 – 2020-02-06 (×2): 80 mg via ORAL
  Filled 2020-02-05 (×2): qty 1

## 2020-02-05 NOTE — Progress Notes (Signed)
  Echocardiogram 2D Echocardiogram has been performed.  Merrie Roof F 02/05/2020, 8:40 AM

## 2020-02-05 NOTE — Progress Notes (Addendum)
STROKE TEAM PROGRESS NOTE  HPI per record:  Darryl Ward is a 75 y.o. male with medical history of coronary artery disease, hypertension, hyperlipidemia, and stroke presenting with right-sided leg weakness and gait instability when he woke up to go to the bathroom in the morning of 02/04/2020. The patient went to bed around 9 PM on 02/03/2020 and was normal at that time. Patient denied any headache, fevers, chills, visual disturbance, dysarthria, word finding difficulty, or dysesthesias. He subsequently asked his friend to bring him to the emergency department with private vehicle, arriving around 11:30 AM. The patient denies any fevers, chills, chest pain, shortness breath, nausea, vomiting, diarrhea, abdominal pain, dysuria, hematuria. He states that he usually takes aspirin 81 mg daily, but he usually misses at least two doses per week. In the emergency department, the patient states that his right leg weakness is a little bit better. He has no new deficits. He was afebrile and hemodynamically stable with oxygen saturation 100% on room air. BMP, LFTs, and CBC were unremarkable. Notably, the patient had a history of daily alcohol use, but he quit drinking in 2008. He continues to smoke 1/2 pack/day.  INTERVAL HISTORY No acute overnight events.  Patient evaluated at bedside this morning, with friend in the room.  Patient states he had right leg weakness for about 4-5 hours before he presented to hospital, which was similar to the one he had in 2008 after stroke.  Denies any weakness in right arm or any difficulty in speech.  He states he feels close to his baseline now and able to ambulate without difficulty.  PT was in the room, patient got up and use the bathroom with notable spasticity in the right leg but notes that is his baseline after stroke in 2008.  Discussion about meningioma and patient states he was not aware of it until yesterday.  He is explained about his benign nature and how we would  require more testing to have a clear answer.  MRI with contrast is ordered and patient shows good understanding about it.  Patient notes he had lower back pain which might be shooting down to his right leg, but unsure.  He has been seen previously multiple times for low back pain and uses lidocaine patch and at times has used oxycodone for the pain.  Ordered MRI lumbar spine without contrast.  Denies any new complaints.  Blood pressure well controlled.  Neurological exam stable.  Dr. Sloan Leiter was present in the room-discussion about patient current situation and updated about the ongoing plan.  Vitals:   02/05/20 0034 02/05/20 0407 02/05/20 0841 02/05/20 1227  BP: (!) 142/60 139/69 132/85 128/66  Pulse: 65 64 (!) 59 62  Resp: 20 15 18 18   Temp: 98.4 F (36.9 C) 98.3 F (36.8 C) 98 F (36.7 C) (!) 97.5 F (36.4 C)  TempSrc: Oral Oral Oral Oral  SpO2: 98% 99% 98% 98%  Weight:      Height:       CBC:  Recent Labs  Lab 02/04/20 1224  WBC 8.3  NEUTROABS 5.8  HGB 13.4  HCT 41.3  MCV 96.3  PLT 881   Basic Metabolic Panel:  Recent Labs  Lab 02/04/20 1224  NA 136  K 3.8  CL 107  CO2 23  GLUCOSE 122*  BUN 17  CREATININE 0.82  CALCIUM 8.8*   Lipid Panel:  Recent Labs  Lab 02/05/20 0755  CHOL 202*  TRIG 99  HDL 29*  CHOLHDL 7.0  VLDL 20  LDLCALC 153*   HgbA1c:  Recent Labs  Lab 02/05/20 0755  HGBA1C 5.1   Urine Drug Screen:  Recent Labs  Lab 02/04/20 1500  LABOPIA NONE DETECTED  COCAINSCRNUR NONE DETECTED  LABBENZ NONE DETECTED  AMPHETMU NONE DETECTED  THCU NONE DETECTED  LABBARB NONE DETECTED    Alcohol Level  Recent Labs  Lab 02/04/20 1224  ETH <10    IMAGING past 24 hours  MRI Brain w contrast 02/05/2019  IMPRESSION: 1. Homogeneously enhancing right sphenoid wing meningioma, up to 2.6 cm long axis. Relative sparing of the right cavernous sinus and no extension into the right orbital apex at this time.  2. No other abnormal intracranial  enhancement or dural thickening.  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 Nonemergent ENT consultation could be performed as indicated.  CT Head Code stroke 02/04/2019  IMPRESSION: There is no acute intracranial hemorrhage or evidence of acute infarction. ASPECT score is 10.  Chronic appearing small vessel infarcts of the basal ganglia bilaterally and deep left cerebral white matter.  Chronic microvascular ischemic changes.  Suspected small meningioma along the right sphenoid wing. MRI could be obtained for confirmation.  CT Angio Head w or wo contrast 02/04/2019  IMPRESSION: 1. 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 2. 55% proximal left ICA stenosis. 3. Moderate right vertebral artery origin stenosis. 4. Mild intracranial atherosclerosis without significant proximal stenosis or major intracranial arterial occlusion. 5. Suspected meningioma along the right sphenoid wing. 6. 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 7. Aortic Atherosclerosis (ICD10-I70.0) and Emphysema (ICD10-J43.9).  US Carotid Bilateral 02/05/2019  IMPRESSION: 1. Moderate to large amount of left-sided atherosclerotic plaque results in elevated peak systolic velocities within the left internal carotid artery compatible with the higher end of the 50-69% luminal narrowing range, compatible with the findings seen on preceding neck CTA. 2. Moderate amount of right-sided atherosclerotic plaque, not resulting in a hemodynamically significant stenosis. 3. Patent dominant right vertebral artery. 4. Nonvisualization of the left vertebral artery however the left vertebral artery is noted to be reconstituted at the level of  the superior aspect of the cervical spine on preceding CTA.  Chest x-ray 02/04/2019  IMPRESSION: Hyperinflation with emphysematous disease and bronchitic changes. Possible vague infiltrates in the right upper lobe and left perihilar lung.   Echo complete Bubble study: 02/04/2019  IMPRESSIONS    1. Left ventricular ejection fraction, by estimation, is 55 to 60%. The  left ventricle has normal function. The left ventricle has no regional  wall motion abnormalities. There is mild left ventricular hypertrophy.  Left ventricular diastolic parameters  are indeterminate.  2. Right ventricular systolic function is normal. The right ventricular  size is normal.  3. Left atrial size was mildly dilated.  4. The mitral valve is normal in structure. Trivial mitral valve  regurgitation. No evidence of mitral stenosis.  5. The aortic valve is tricuspid. Aortic valve regurgitation is not  visualized. No aortic stenosis is present.  6. The inferior vena cava is normal in size with greater than 50%  respiratory variability, suggesting right atrial pressure of 3 mmHg.  7. Agitated saline contrast bubble study was negative, with no evidence  of any interatrial shunt.   Echocardiogram complete  02/05/2019  IMPRESSIONS  1. Left ventricular ejection fraction, by estimation, is 60 to 65%. The  left ventricle has normal function. The left ventricle has no regional  wall motion abnormalities. Left ventricular diastolic parameters were  normal.  2. Right ventricular systolic function is normal. The right ventricular  size is normal.  3. Left atrial size was moderately dilated.  4. The mitral valve is normal in structure. No evidence of mitral valve  regurgitation. No evidence of mitral stenosis.  5. The aortic valve is tricuspid. Aortic valve regurgitation is not  visualized. No aortic stenosis is present.  6. The inferior vena cava is normal in size with greater than 50%   respiratory variability, suggesting right atrial pressure of 3 mmHg.      PHYSICAL EXAM General: Well developed well nourished elderly Caucasian male sitting comfortably in bed, no acute distress HEENT: [place]/AT, MMM Cardiovascular: Regular rate and rhythm Pulmonary: Clear to auscultation bilaterally, no respiratory discomfort Abdomen: Soft, nontender, no rigidity Extremities: Warm, no peripheral edema Neurological: General: NAD Mental Status: Alert, oriented, thought content appropriate.  Speech fluent without evidence of aphasia.  Able to follow 3 step commands without difficulty. Cranial Nerves: II:  Visual fields grossly normal, pupils equal, round, reactive to light and accommodation III,IV, VI: ptosis not present, extra-ocular motions intact bilaterally V,VII: smile symmetric, facial light touch sensation normal bilaterally VIII: hearing normal bilaterally IX,X: uvula rises symmetrically XI: bilateral shoulder shrug XII: midline tongue extension without atrophy or fasciculations  Motor: Right : Upper extremity   5/5    Left:     Upper extremity   5/5  Lower extremity   5/5     Lower extremity   5/5 Tone slightly increased in therightleg.  Mild weakness of right ankle dorsiflexors and hip flexors only.  No atrophy noted Sensory: Pinprick and light touch intact throughout, bilaterally Deep Tendon Reflexes:  Right: Upper Extremity   Left: Upper extremity   biceps (C-5 to C-6) 3/4   biceps (C-5 to C-6) 2/4 tricep (C7) 3/4    triceps (C7) 2/4 Brachioradialis (C6) 3/4  Brachioradialis (C6) 2/4  Lower Extremity Lower Extremity  quadriceps (L-2 to L-4) 3/4   quadriceps (L-2 to L-4) 2/4 Achilles (S1) 3/4   Achilles (S1) 2/4  Plantars: Right: downgoing   Left: downgoing Cerebellar: normal finger-to-nose,  normal heel-to-shin test Gait: Slight dragging of right leg with slight spasticity on right   ASSESSMENT/PLAN Mr. LABRADFORD SCHNITKER is a 75 y.o. male with history of  stroke, hyperlipidemia, hypertension, coronary artery disease presented with right leg weakness.  Imaging studies does not show any acute infarct.  No tPA given, out of time window.   Possible previous stroke deficit flare up vs degenerative lumbar spine disease causing right leg weakness .asymptomatic right sphenoid wing meningioma likely unrelated to presentation   CT head There is no acute intracranial hemorrhage or evidence of acute infarction. ASPECT score is 10.  CTA head & neck: Occlusion of the left vertebral artery at its origin with distal reconstitution.55% proximal left ICA stenosis. 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  MRI: No acute intracranial finding  MR w contrast: Homogeneously enhancing right sphenoid wing meningioma, up to 2.6 cm long axis. Relative sparing of the right cavernous sinus and no extension into the right orbital apex at this time  Carotid Doppler: Moderate to large amount  of left-sided atherosclerotic plaque results in elevated peak systolic velocities within the left internal carotid artery compatible with the higher end of the 50-69% luminal narrowing range, compatible with the findings seen onpreceding neck CTA.  2D Echo: Left ventricular ejection fraction, by estimation, is 60 to 65%. The  left ventricle has normal function  Echo complete Bubble study: Left ventricular ejection fraction, by estimation, is 55 to 60%. The  left ventricle has normal function.   MR Lumbar spine wo contrast: pending  LDL 139  HgbA1c 5.1  VTE prophylaxis -Lovenox 40 mg    Diet   Diet Heart Room service appropriate? Yes with Assist; Fluid consistency: Thin    aspirin 81 mg daily prior to admission, now on aspirin 81 mg daily.    Therapy recommendations: Outpatient PT after NS consult  Disposition: Likely home  Hypertension  Home meds: Metoprolol 25 mg  Stable . Permissive hypertension (OK if < 220/120) but gradually normalize in 5-7 days . 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  MR w contrast: Homogeneously enhancing right sphenoid wing meningioma, up to 2.6 cm long axis. Relative sparing of the right cavernous sinus and no extension into the right orbital apex at this time  Currently asymptomatic  Follow-up as an outpatient   Other Stroke Risk Factors  Advanced Age >/= 52   Former Cigarette smoker ( 30-pack year history)  Hx stroke: No records available  Coronary artery disease   Other Active Problems  Peripheral vascular disease, unspecified  Chronic midline low back pain without sciatica: Lidocaine patch  Abdominal aortic aneurysm s/p repair  Hospital day # 0 I have personally obtained history,examined this patient, reviewed notes, independently viewed imaging studies, participated in medical decision making and plan of care.ROS completed by me personally and pertinent positives fully documented  I have made any additions or clarifications directly to the above note. Agree with note above.  Patient presented with a relatively sudden onset of right leg weakness when though he is having some residual gait difficulties following a previous stroke years ago.  MRI shows no evidence of an acute stroke but does show an incidental right sphenoid wing meningioma.  Recommend check MRI of the brain with contrast to confirm the meningioma and check MRI lumbar spine to evaluate for degenerative compressive lumbar spine disease which could  contribute to the right leg weakness.  Continue ongoing stroke work-up.  Patient will need elective referral to neurosurgery for his meningioma which can be followed conservatively at the present time.  Continue aspirin for stroke prevention and aggressive risk factor modification.  Physical Occupational Therapy consults.  Discussed with Dr. Barb Merino  Greater than 50% time during 35-minute visit was spent in counseling and coordination of care about his leg weakness and discussion of MRI findings and further evaluation answering questions  Antony Contras, MD Medical Director [place] Pager: [phone] 02/05/2020 3:46 PM   To contact Stroke Continuity provider, please refer to [email or web address] After hours, contact General Neurology

## 2020-02-05 NOTE — Evaluation (Addendum)
Physical Therapy Evaluation Patient Details Name: Darryl Ward MRN: [id number] DOB: [birth year] Today's Date: 02/05/2020   History of Present Illness  Pt admitted 1/14 with sided weakness. Workup negative for new stroke but + for brain mass. PMH includes but not limited to: coronary artery disease, hypertension, hyperlipidemia, and remote stroke.  Clinical Impression  Pt admitted with above diagnosis.  Pt and wife report he is at baseline level of functioning from PTA, but that he has gotten weaker over the last couple months. They would like a rollator for home and possibly outpatient PT at some point down the road to address balance issues. Pt will likely DC this afternoon and pt and wife are comfortable with that from a mobility standpoint.  Discussed that pt has an OT and SLP consult currently and what they would assess for in the hospital. They do not feel like they need either one of those services at this time.  Pt to sign off at this time as well.     Follow Up Recommendations Other (comment) (possible outpt PT after NS consult)    Equipment Recommendations  Other (comment) (Rollator walker (with seat))    Recommendations for Other Services       Precautions / Restrictions Precautions Precautions: Fall Restrictions Weight Bearing Restrictions: No      Mobility  Bed Mobility Overal bed mobility: Independent                  Transfers Overall transfer level: Modified independent Equipment used: None             General transfer comment: Able to stand without use of arms.  Ambulation/Gait Ambulation/Gait assistance: Modified independent (Device/Increase time) Gait Distance (Feet): 200 Feet Assistive device: None   Gait velocity: Not formally measured but [facility]   General Gait Details: pt had some minor balance disturbances but always able to self correct. Pt reports his balance is better when he has his shoes on. decreased toe off on RLE.  Stairs Stairs:  Yes Stairs assistance: Modified independent (Device/Increase time) Stair Management: Two rails;Alternating pattern;Forwards Number of Stairs: 3 General stair comments: no balance loss  Wheelchair Mobility    Modified Rankin (Stroke Patients Only)       Balance Overall balance assessment: Mild deficits observed, not formally tested (reports no history of falls. Both pt and wife reports that he does stagger at times especially when first getting up or when walking in wide open spaces.)                                           Pertinent Vitals/Pain Pain Assessment: 0-10 Pain Score: 0-No pain    Home Living Family/patient expects to be discharged to:: Private residence Living Arrangements: Spouse/significant other Available Help at Discharge: Available 24 hours/day Type of Home: House Home Access: Stairs to enter   [employer] of Steps: 3 Home Layout: One level Home Equipment: None      Prior Function Level of Independence: Independent (Works. Had some balance issue, but no falls and no use of DME.)               Hand Dominance   Dominant Hand: Right    Extremity/Trunk Assessment   Upper Extremity Assessment Upper Extremity Assessment: Overall WFL for tasks assessed    Lower Extremity Assessment Lower Extremity Assessment: RLE deficits/detail RLE Deficits / Details: mild  weakness and decreased functioanal coordination    Cervical / Trunk Assessment Cervical / Trunk Assessment: Normal  Communication   Communication: No difficulties  Cognition Arousal/Alertness: Awake/alert Behavior During Therapy: WFL for tasks assessed/performed Overall Cognitive Status: Within Functional Limits for tasks assessed                                        General Comments General comments (skin integrity, edema, etc.): Wife present for session as well as several MDs present for part of session.    Exercises      Assessment/Plan    PT Assessment Patent does not need any further PT services (May benefit from outpt PT dopwn the road to address balance. wife repors the MD is referring pt to neuro surgery and once course of treatment is decide may benefit from the outpt PT)  PT Problem List         PT Treatment Interventions      PT Goals (Current goals can be found in the Care Plan section)       Frequency     Barriers to discharge        Co-evaluation               AM-PAC PT "6 Clicks" Mobility  Outcome Measure Help needed turning from your back to your side while in a flat bed without using bedrails?: None Help needed moving from lying on your back to sitting on the side of a flat bed without using bedrails?: None Help needed moving to and from a bed to a chair (including a wheelchair)?: None Help needed standing up from a chair using your arms (e.g., wheelchair or bedside chair)?: None Help needed to walk in hospital room?: None Help needed climbing 3-5 steps with a railing? : None 6 Click Score: 24    End of Session Equipment Utilized During Treatment: Gait belt Activity Tolerance: Patient tolerated treatment well Patient left: in chair;with call bell/phone within reach;with family/visitor present Nurse Communication: Mobility status;Other (comment) (Pt would like rollator walker for home) PT Visit Diagnosis: Unsteadiness on feet (R26.81)    Time: 8242-3536 PT Time Calculation (min) (ACUTE ONLY): 41 min   Charges:   PT Evaluation $PT Eval Moderate Complexity: 1 Mod PT Treatments $Gait Training: 8-22 mins Only 2 charges as part of time was spent waiting as MDs performed their evaluations and pt used the bathroom during session.       Lavonia Dana, PT   Acute Rehabilitation Services  Pager [phone] Office [phone] 02/05/2020   Melvern Banker 02/05/2020, 12:04 PM

## 2020-02-05 NOTE — Progress Notes (Signed)
OT Cancellation Note  Patient Details Name: Darryl Ward MRN: [id number] DOB: 1945/12/12   Cancelled Treatment:    Reason Eval/Treat Not Completed: OT screened, no needs identified, will sign off. Per PT report, patient functioning at baseline with no noted deficits.   Gloris Manchester OTR/L Supplemental OT, Department of rehab services [phone]  Felipe Drone 02/05/2020, 12:17 PM

## 2020-02-05 NOTE — Progress Notes (Signed)
PROGRESS NOTE    Darryl Ward  GHW:[id number] DOB: 10/24/1945 DOA: 02/04/2020 PCP: [name], FNP    Brief Narrative:  75 year old gentleman with history of coronary artery disease, hypertension, hyperlipidemia, history of a stroke presented with right-sided leg weakness and gait instability that he woke up in the morning with.  Went to bed on 1/13 9 PM with no symptoms, woke up in the morning with dragging her right leg, called his friend and rode in a private vehicle to the ER on 11:30 AM in the emergency room, hemodynamically stable.  Right leg weakness already improved.  No new deficits.  He was not a tPA or neurosurgical intervention candidate.    Assessment & Plan:   Active Problems:   Mixed hyperlipidemia   Coronary atherosclerosis of native coronary artery   Tobacco abuse   Right hemiparesis (HCC)   Cerebral thrombosis with cerebral infarction  Suspected left MCA stroke, with history of stroke and right lower extremity spasticity: Clinical findings, weakness of the right leg that is already improved.  Has chronic spasticity. CT head findings, chronic ischemic changes MRI of the brain, chronic ischemic changes, 2 cm right sphenoid meningioma MRI brain with contrast, confirms meningioma with no local pressure. CTA of the head and neck, major artery stenosis, good collaterals.  No LVO. 2D echocardiogram, no source of embolism.  Normal ejection fraction. Antiplatelet therapy, intermittently on aspirin.  Started on aspirin 81 mg daily. LDL, 153. On Crestor 20 mg daily at home , starting on a high intensity statin 80 mg daily. Hemoglobin A1c, 5.1.  No indication for treatment. DVT prophylaxis, Lovenox. Therapy recommendations, outpatient follow-up. Smoking cessation counseling done. MRI lumbar spine pending, to evaluate for his spasticity.  Meningioma: Without pressure symptoms.  Outpatient follow-up.  We will send to neurosurgery on discharge.  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  Dedicated soft tissue ultrasound shows partially cystic and partially solid mass.  Likely benign complex cyst. We will send to ENT on discharge.  Hypertension: No indication for permissive hypertension.  Will resume metoprolol.     DVT prophylaxis: enoxaparin (LOVENOX) injection 40 mg Start: 02/04/20 1800   Code Status: Full code Family Communication: None Disposition Plan: Status is: Observation  The patient remains OBS appropriate and will d/c before 2 midnights.  Dispo: The patient is from: Home              Anticipated d/c is to: Home              Anticipated d/c date is: 2 days              Patient currently is not medically stable to d/c.         Consultants:   Neurology  Procedures:   None  Antimicrobials:   None     Subjective: Patient seen and examined.  His roommate later arrived. Examined with neurologist.  Patient himself denies any new findings.  He thinks his right leg is better now.  He does walk with a spasticity and that is chronic.   Objective: Vitals:   02/05/20 0034 02/05/20 0407 02/05/20 0841 02/05/20 1227  BP: (!) 142/60 139/69 132/85 128/66  Pulse: 65 64 (!) 59 62  Resp: 20 15 18 18   Temp: 98.4 F (36.9 C) 98.3 F (36.8 C) 98 F (36.7 C) (!) 97.5 F (36.4 C)  TempSrc: Oral Oral Oral Oral  SpO2: 98% 99% 98% 98%  Weight:  Height:       No intake or output data in the 24 hours ending 02/05/20 1526 Filed Weights   02/04/20 1221  Weight: 81.6 kg    Examination:  General exam: Appears calm and comfortable  Walking around with physical therapy.  He does walk with a spastic gait on the right leg. Respiratory system: Clear to auscultation. Respiratory effort normal.  No added sounds. Cardiovascular system: S1 & S2 heard, RRR. No pedal edema. Gastrointestinal system: Abdomen is nondistended, soft and nontender. No organomegaly or masses felt. Normal bowel sounds  heard. Central nervous system: Alert and oriented.  Cranial nerves normal. Motor and sensory exam normal. Spastic gait on the right. Extremities: Symmetric 5 x 5 power. Skin: No rashes, lesions or ulcers Psychiatry: Judgement and insight appear normal. Mood & affect appropriate.     Data Reviewed: I have personally reviewed following labs and imaging studies  CBC: Recent Labs  Lab 02/04/20 1224  WBC 8.3  NEUTROABS 5.8  HGB 13.4  HCT 41.3  MCV 96.3  PLT 604   Basic Metabolic Panel: Recent Labs  Lab 02/04/20 1224  NA 136  K 3.8  CL 107  CO2 23  GLUCOSE 122*  BUN 17  CREATININE 0.82  CALCIUM 8.8*   GFR: Estimated Creatinine Clearance: 82.8 mL/min (by C-G formula based on SCr of 0.82 mg/dL). Liver Function Tests: Recent Labs  Lab 02/04/20 1224  AST 16  ALT 10  ALKPHOS 56  BILITOT 0.8  PROT 7.2  ALBUMIN 3.6   No results for input(s): LIPASE, AMYLASE in the last 168 hours. No results for input(s): AMMONIA in the last 168 hours. Patient profile: Recent Labs  Lab 02/04/20 1224  INR 1.0   Cardiac Enzymes: No results for input(s): CKTOTAL, CKMB, CKMBINDEX, TROPONINI in the last 168 hours. BNP (last 3 results) No results for input(s): PROBNP in the last 8760 hours. HbA1C: Recent Labs    02/05/20 0755  HGBA1C 5.1   CBG: No results for input(s): GLUCAP in the last 168 hours. Lipid Profile: Recent Labs    02/05/20 0755  CHOL 202*  HDL 29*  LDLCALC 153*  TRIG 99  CHOLHDL 7.0   Thyroid Function Tests: No results for input(s): TSH, T4TOTAL, FREET4, T3FREE, THYROIDAB in the last 72 hours. Anemia Panel: No results for input(s): VITAMINB12, FOLATE, FERRITIN, TIBC, IRON, RETICCTPCT in the last 72 hours. Sepsis Labs: No results for input(s): PROCALCITON, LATICACIDVEN in the last 168 hours.  Recent Results (from the past 240 hour(s))  Resp Panel by RT-PCR (Flu A&B, Covid) Nasopharyngeal Swab     Status: None   Collection Time: 02/04/20  1:25 PM    Specimen: Nasopharyngeal Swab; Nasopharyngeal(NP) swabs in vial transport medium  Result Value Ref Range Status   SARS Coronavirus 2 by RT PCR NEGATIVE NEGATIVE Final    Comment: (NOTE) SARS-CoV-2 target nucleic acids are NOT DETECTED.  The SARS-CoV-2 RNA is generally detectable in upper respiratory specimens during the acute phase of infection. The lowest concentration of SARS-CoV-2 viral copies this assay can detect is 138 copies/mL. A negative result does not preclude SARS-Cov-2 infection and should not be used as the sole basis for treatment or other patient management decisions. A negative result may occur with  improper specimen collection/handling, submission of specimen other than nasopharyngeal swab, presence of viral mutation(s) within the areas targeted by this assay, and inadequate number of viral copies(<138 copies/mL). A negative result must be combined with clinical observations, patient history, and epidemiological information. The  expected result is Negative.  Fact Sheet for Patients:  EntrepreneurPulse.com.au  Fact Sheet for Healthcare Providers:  IncredibleEmployment.be  This test is no t yet approved or cleared by the [place] FDA and  has been authorized for detection and/or diagnosis of SARS-CoV-2 by FDA under an Emergency Use Authorization (EUA). This EUA will remain  in effect (meaning this test can be used) for the duration of the COVID-19 declaration under Section 564(b)(1) of the Act, 21 U.S.C.section 360bbb-3(b)(1), unless the authorization is terminated  or revoked sooner.       Influenza A by PCR NEGATIVE NEGATIVE Final   Influenza B by PCR NEGATIVE NEGATIVE Final    Comment: (NOTE) The Xpert Xpress SARS-CoV-2/FLU/RSV plus assay is intended as an aid in the diagnosis of influenza from Nasopharyngeal swab specimens and should not be used as a sole basis for treatment. Nasal washings and aspirates are  unacceptable for Xpert Xpress SARS-CoV-2/FLU/RSV testing.  Fact Sheet for Patients: EntrepreneurPulse.com.au  Fact Sheet for Healthcare Providers: IncredibleEmployment.be  This test is not yet approved or cleared by the [place] FDA and has been authorized for detection and/or diagnosis of SARS-CoV-2 by FDA under an Emergency Use Authorization (EUA). This EUA will remain in effect (meaning this test can be used) for the duration of the COVID-19 declaration under Section 564(b)(1) of the Act, 21 U.S.C. section 360bbb-3(b)(1), unless the authorization is terminated or revoked.  Performed at [facility], [address] [place], [place] [address]          Radiology Studies: CT ANGIO HEAD W OR WO CONTRAST  Result Date: 02/04/2020 CLINICAL DATA:  Confusion, unsteady gait, and dragging right foot. EXAM: CT ANGIOGRAPHY HEAD AND NECK TECHNIQUE: Multidetector CT imaging of the head and neck was performed using the standard protocol during bolus administration of intravenous contrast. Multiplanar CT image reconstructions and MIPs were obtained to evaluate the vascular anatomy. Carotid stenosis measurements (when applicable) are obtained utilizing NASCET criteria, using the distal internal carotid diameter as the denominator. CONTRAST:  2mL OMNIPAQUE IOHEXOL 350 MG/ML SOLN COMPARISON:  None. FINDINGS: CTA NECK FINDINGS Aortic arch: Moderate calcified plaque and small amount of soft plaque projecting into the lumen of the distal arch, incompletely imaged. Normal variant aortic arch branching pattern with common origin of the brachiocephalic and left common carotid arteries. No significant arch vessel origin stenosis. Right carotid system: Patent with mild-to-moderate calcified and soft plaque at the carotid bifurcation. No evidence of a significant stenosis or dissection. Left carotid system: Patent with moderate calcified and soft plaque at the carotid  bifurcation resulting in 55% proximal ICA stenosis. Tortuous mid cervical ICA. Vertebral arteries: The right vertebral artery is patent and dominant with calcified and soft plaque at its origin resulting in moderate stenosis. The left vertebral artery is occluded at its origin with distal reconstitution beginning at the C3-4 level. Skeleton: Focally advanced disc degeneration at C6-7. Other neck: No evidence of cervical lymphadenopathy. 1.3 cm centrally hypoattenuating mass in the left parotid gland with nodular peripheral enhancement. Upper chest: Centrilobular emphysema and mild scarring in the lung apices. Review of the MIP images confirms the above findings CTA HEAD FINDINGS Anterior circulation: The internal carotid arteries are patent from skull base to carotid termini with mild atherosclerotic plaque bilaterally not resulting in a significant stenosis. 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 ACAs and MCAs are patent without evidence of a proximal branch occlusion or significant proximal stenosis. No  aneurysm is identified. There is a mass along the right sphenoid wing suspicious for a meningioma as noted on today's earlier noncontrast CT. Posterior circulation: The intracranial vertebral arteries are patent to the basilar with mild stenosis on the right due to atherosclerotic plaque. The right PICA and left AICA appear dominant. Patent SCAs are seen bilaterally. The basilar artery is widely patent. There is a diminutive left posterior communicating artery. The PCAs are patent with mild branch vessel irregularity but no evidence of a significant proximal stenosis. No aneurysm is identified. Venous sinuses: Patent. Anatomic variants: Absent right A1. Review of the MIP images confirms the above findings IMPRESSION: 1. 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 2. 55% proximal left ICA stenosis. 3. Moderate right vertebral  artery origin stenosis. 4. Mild intracranial atherosclerosis without significant proximal stenosis or major intracranial arterial occlusion. 5. Suspected meningioma along the right sphenoid wing. 6. 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 7. Aortic Atherosclerosis (ICD10-I70.0) and Emphysema (ICD10-J43.9). Electronically Signed   By: Logan Bores M.D.   On: 02/04/2020 15:09   DG Chest 2 View  Result Date: 02/04/2020 CLINICAL DATA:  Right-sided weakness EXAM: CHEST - 2 VIEW COMPARISON:  10/27/2018, CT 12/03/2019 FINDINGS: Hyperinflation with emphysematous disease. Possible vague infiltrates in the right upper lobe and left perihilar lung. No pleural effusion or pneumothorax. Normal heart size with aortic atherosclerosis. No pneumothorax. IMPRESSION: Hyperinflation with emphysematous disease and bronchitic changes. Possible vague infiltrates in the right upper lobe and left perihilar lung. Electronically Signed   By: Donavan Foil M.D.   On: 02/04/2020 17:45   MR BRAIN WO CONTRAST  Result Date: 02/04/2020 CLINICAL DATA:  Right-sided weakness and gait instability. EXAM: MRI HEAD WITHOUT CONTRAST TECHNIQUE: Multiplanar, multiecho pulse sequences of the brain and surrounding structures were obtained without intravenous contrast. COMPARISON:  CT studies same day FINDINGS: Brain: Diffusion imaging does not show any acute or subacute infarction. No focal abnormality affects the brainstem. There is some cerebellar atrophy without evidence of a focal stroke. Cerebral hemispheres show generalized volume loss with chronic small-vessel ischemic changes of the white matter. Old lacunar infarction in the right basal ganglia. Some high signal on the DWI trace image does not represent true restricted diffusion based on the ADC map. No large vessel territory infarction. No intra-axial mass, recent hemorrhage, hydrocephalus or extra-axial collection. Meningioma of the right sphenoid wing measuring  approximately 2.3 x 2.1 x 1.7 cm. No significant mass-effect upon the brain. This is close to the orbital apex. Vascular: Major vessels at the base of the brain show flow. Skull and upper cervical spine: Negative Sinuses/Orbits: Sinuses are clear.  Orbits are negative. Other: Bilateral mastoid effusions. 1 cm left parotid cystic area could be a benign cyst or a cystic tumor. This does not show aggressive features. IMPRESSION: 1. No acute intracranial finding. Generalized brain atrophy. Chronic small-vessel ischemic changes of the cerebral hemispheric white matter. Old lacunar infarction of the right basal ganglia. High signal in the right caudate head does not represent true restricted diffusion. 2. 2.3 x 2.1 x 1.7 cm meningioma of the right sphenoid wing without significant mass-effect upon the brain. This is close to the orbital apex. 3. Bilateral mastoid effusions. 1 cm left parotid cystic area could be a benign cyst or cystic tumor. Electronically Signed   By: Nelson Chimes M.D.   On: 02/04/2020 16:38   MR BRAIN W CONTRAST  Result Date: 02/05/2020 CLINICAL DATA:  75 year old  male with recent right side weakness and gait instability. 2.3 cm right sphenoid wing meningioma on recent CT and noncontrast brain MRI. EXAM: MRI HEAD WITH CONTRAST TECHNIQUE: Multiplanar, multiecho pulse sequences of the brain and surrounding structures were obtained with intravenous contrast. CONTRAST:  8.35mL GADAVIST GADOBUTROL 1 MMOL/ML IV SOLN COMPARISON:  Noncontrast brain MRI yesterday. FINDINGS: The major dural venous sinuses are enhancing and appear to be patent. Lobulated dural-based solidly enhancing extra-axial mass of the right sphenoid wing corresponding to the noncontrast finding yesterday encompasses 20 x 26 x 20 mm (AP by transverse by CC). Regional dural thickening (series 6, image 25). Stable mild mass effect on the adjacent right temporal lobe as before. Relative sparing of the right cavernous sinus. The lesion abuts  but does not extend into the right orbital apex at this time. No other abnormal intracranial enhancement or dural thickening identified. T1 signal appearance of brain parenchyma otherwise stable from yesterday. Other findings: The small T2 hyperintense lesion of the left parotid gland seen yesterday demonstrates little if any enhancement (series 7, image 16), benign cyst or benign mixed tumor of the parotid favored. IMPRESSION: 1. Homogeneously enhancing right sphenoid wing meningioma, up to 2.6 cm long axis. Relative sparing of the right cavernous sinus and no extension into the right orbital apex at this time. 2. No other abnormal intracranial enhancement or dural thickening. Electronically Signed   By: Genevie Ann M.D.   On: 02/05/2020 10:28   US SOFT TISSUE HEAD & NECK (NON-THYROID)  Result Date: 02/05/2020 CLINICAL DATA:  Evaluate left-sided parotid nodule demonstrated on preceding neck CTA EXAM: ULTRASOUND OF HEAD/NECK SOFT TISSUES TECHNIQUE: Ultrasound examination of the head and neck soft tissues was performed in the area of clinical concern. COMPARISON:  Neck CTA-02/04/2020 FINDINGS: Redemonstrated approximately 1.2 x 1.0 x 0.8 cm partially cystic, partially solid hypoechoic well-defined nodule within the deep portion of the left parotid gland correlating with the nodule seen on preceding neck CTA image 100, series 4. No definitive blood flow is demonstrated within this nodule and there is no associated parotid ductal dilatation. Otherwise, normal appearance of the left parotid gland. Limited visualization the contralateral right parotid gland, scanned for comparison purposes, is normal. 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 Nonemergent ENT consultation could be performed as indicated. Electronically Signed   By: Sandi Mariscal M.D.    On: 02/05/2020 09:39   US Carotid Bilateral (at [facility] and [facility] only)  Result Date: 02/05/2020 CLINICAL DATA:  History of carotid artery stenosis. History TIA, hypertension, CAD, and hyperlipidemia. Former smoker. EXAM: BILATERAL CAROTID DUPLEX ULTRASOUND TECHNIQUE: Pearline Cables scale imaging, color Doppler and duplex ultrasound were performed of bilateral carotid and vertebral arteries in the neck. COMPARISON:  CTA head and neck - earlier same day FINDINGS: Criteria: Quantification of carotid stenosis is based on velocity parameters that correlate the residual internal carotid diameter with NASCET-based stenosis levels, using the diameter of the distal internal carotid lumen as the denominator for stenosis measurement. The following velocity measurements were obtained: RIGHT ICA: 111/27 cm/sec CCA: 16/10 cm/sec SYSTOLIC ICA/CCA RATIO:  0.8 ECA: 76 cm/sec LEFT ICA: 205/41 cm/sec CCA: 96/04 cm/sec SYSTOLIC ICA/CCA RATIO:  2.5 ECA: 85 cm/sec RIGHT CAROTID ARTERY: There is a moderate amount of eccentric echogenic plaque within the right carotid bulb (image 14), not resulting in elevated peak systolic velocities within the interrogated course the right internal carotid  artery to suggest a hemodynamically significant stenosis. RIGHT VERTEBRAL ARTERY:  Antegrade flow LEFT CAROTID ARTERY: There is a moderate amount of eccentric echogenic plaque within the left carotid bulb (image 47). There is a moderate to large amount of atherosclerotic plaque involving the origin and proximal aspects of the left internal carotid artery (image 57), which results in elevated peak systolic velocities within the proximal and mid aspects of the left internal carotid artery. Greatest acquired peak systolic velocity within mid left ICA measures 205 centimeters/second (image 62). LEFT VERTEBRAL ARTERY: Not visualized - note, the right vertebral artery is noted to be dominant with reconstitution of an atretic left vertebral artery at the level of the  superior aspect of the cervical spine as demonstrated on preceding CTA IMPRESSION: 1. Moderate to large amount of left-sided atherosclerotic plaque results in elevated peak systolic velocities within the left internal carotid artery compatible with the higher end of the 50-69% luminal narrowing range, compatible with the findings seen on preceding neck CTA. 2. Moderate amount of right-sided atherosclerotic plaque, not resulting in a hemodynamically significant stenosis. 3. Patent dominant right vertebral artery. 4. Nonvisualization of the left vertebral artery however the left vertebral artery is noted to be reconstituted at the level of the superior aspect of the cervical spine on preceding CTA. Electronically Signed   By: Sandi Mariscal M.D.   On: 02/05/2020 08:14   ECHOCARDIOGRAM COMPLETE  Result Date: 02/05/2020    ECHOCARDIOGRAM REPORT   Patient Name:   MANVILLE RICO Date of Exam: 02/05/2020 Medical Rec #:  [id number]        Height:       70.0 in Accession #:    [id number]       Weight:       180.0 lb Date of Birth:  May 29, 1945         BSA:          1.996 m Patient Age:    18 years         BP:           132/85 mmHg Patient Gender: M                HR:           60 bpm. Exam Location:  Inpatient Procedure: 2D Echo, Cardiac Doppler and Color Doppler Indications:    Stroke  History:        Patient has no prior history of Echocardiogram examinations.  Sonographer:    Merrie Roof RDCS Referring Phys: [phone] DAVID TAT IMPRESSIONS  1. Left ventricular ejection fraction, by estimation, is 60 to 65%. The left ventricle has normal function. The left ventricle has no regional wall motion abnormalities. Left ventricular diastolic parameters were normal.  2. Right ventricular systolic function is normal. The right ventricular size is normal.  3. Left atrial size was moderately dilated.  4. The mitral valve is normal in structure. No evidence of mitral valve regurgitation. No evidence of mitral stenosis.  5. The aortic valve  is tricuspid. Aortic valve regurgitation is not visualized. No aortic stenosis is present.  6. The inferior vena cava is normal in size with greater than 50% respiratory variability, suggesting right atrial pressure of 3 mmHg. FINDINGS  Left Ventricle: Left ventricular ejection fraction, by estimation, is 60 to 65%. The left ventricle has normal function. The left ventricle has no regional wall motion abnormalities. The left ventricular internal cavity size was normal in size. There is  no left ventricular hypertrophy.  Left ventricular diastolic parameters were normal. Right Ventricle: The right ventricular size is normal. No increase in right ventricular wall thickness. Right ventricular systolic function is normal. Left Atrium: Left atrial size was moderately dilated. Right Atrium: Right atrial size was normal in size. Pericardium: There is no evidence of pericardial effusion. Mitral Valve: The mitral valve is normal in structure. There is mild thickening of the mitral valve leaflet(s). There is mild calcification of the mitral valve leaflet(s). No evidence of mitral valve regurgitation. No evidence of mitral valve stenosis. Tricuspid Valve: The tricuspid valve is normal in structure. Tricuspid valve regurgitation is mild . No evidence of tricuspid stenosis. Aortic Valve: The aortic valve is tricuspid. Aortic valve regurgitation is not visualized. No aortic stenosis is present. Pulmonic Valve: The pulmonic valve was normal in structure. Pulmonic valve regurgitation is not visualized. No evidence of pulmonic stenosis. Aorta: The aortic root is normal in size and structure. Venous: The inferior vena cava is normal in size with greater than 50% respiratory variability, suggesting right atrial pressure of 3 mmHg. IAS/Shunts: No atrial level shunt detected by color flow Doppler.  LEFT VENTRICLE PLAX 2D LVIDd:         5.20 cm     Diastology LVIDs:         3.50 cm     LV e' medial:    8.27 cm/s LV PW:         1.00 cm      LV E/e' medial:  10.2 LV IVS:        1.00 cm     LV e' lateral:   8.81 cm/s LVOT diam:     2.20 cm     LV E/e' lateral: 9.5 LV SV:         79 LV SV Index:   40 LVOT Area:     3.80 cm  LV Volumes (MOD) LV vol d, MOD A4C: 63.7 ml LV vol s, MOD A4C: 28.1 ml LV SV MOD A4C:     63.7 ml RIGHT VENTRICLE RV Basal diam:  4.10 cm RV Mid diam:    3.10 cm LEFT ATRIUM             Index       RIGHT ATRIUM           Index LA diam:        4.90 cm 2.46 cm/m  RA Area:     19.50 cm LA Vol (A2C):   74.9 ml 37.53 ml/m RA Volume:   52.80 ml  26.45 ml/m LA Vol (A4C):   74.8 ml 37.48 ml/m LA Biplane Vol: 77.2 ml 38.68 ml/m  AORTIC VALVE LVOT Vmax:   96.60 cm/s LVOT Vmean:  60.100 cm/s LVOT VTI:    0.208 m  AORTA Ao Root diam: 3.90 cm Ao Asc diam:  3.30 cm MITRAL VALVE               TRICUSPID VALVE MV Area (PHT): 3.33 cm    TR Peak grad:   26.4 mmHg MV Decel Time: 228 msec    TR Vmax:        257.00 cm/s MV E velocity: 84.00 cm/s MV A velocity: 43.70 cm/s  SHUNTS MV E/A ratio:  1.92        Systemic VTI:  0.21 m                            Systemic Diam: 2.20 cm  Jenkins Rouge MD Electronically signed by Jenkins Rouge MD Signature Date/Time: 02/05/2020/9:30:48 AM    Final    ECHOCARDIOGRAM COMPLETE BUBBLE STUDY  Result Date: 02/04/2020    ECHOCARDIOGRAM REPORT   Patient Name:   MONTAGUE CORELLA Date of Exam: 02/04/2020 Medical Rec #:  [id number]        Height:       70.0 in Accession #:    [id number]       Weight:       180.0 lb Date of Birth:  July 30, 1945         BSA:          1.996 m Patient Age:    13 years         BP:           165/82 mmHg Patient Gender: M                HR:           63 bpm. Exam Location:  [facility] Procedure: 2D Echo Indications:    Stroke 434.91 / I63.9  History:        Patient has no prior history of Echocardiogram examinations.                 Stroke; Risk Factors:Dyslipidemia and Current Smoker. Right                 Hemiparesis, Aneurysm of Abdominal.  Sonographer:    Leavy Cella RDCS (AE) Referring Phys:  [id number] ERIC LINDZEN IMPRESSIONS  1. Left ventricular ejection fraction, by estimation, is 55 to 60%. The left ventricle has normal function. The left ventricle has no regional wall motion abnormalities. There is mild left ventricular hypertrophy. Left ventricular diastolic parameters are indeterminate.  2. Right ventricular systolic function is normal. The right ventricular size is normal.  3. Left atrial size was mildly dilated.  4. The mitral valve is normal in structure. Trivial mitral valve regurgitation. No evidence of mitral stenosis.  5. The aortic valve is tricuspid. Aortic valve regurgitation is not visualized. No aortic stenosis is present.  6. The inferior vena cava is normal in size with greater than 50% respiratory variability, suggesting right atrial pressure of 3 mmHg.  7. Agitated saline contrast bubble study was negative, with no evidence of any interatrial shunt. FINDINGS  Left Ventricle: Left ventricular ejection fraction, by estimation, is 55 to 60%. The left ventricle has normal function. The left ventricle has no regional wall motion abnormalities. The left ventricular internal cavity size was normal in size. There is  mild left ventricular hypertrophy. Left ventricular diastolic parameters are indeterminate. Right Ventricle: The right ventricular size is normal. No increase in right ventricular wall thickness. Right ventricular systolic function is normal. Left Atrium: Left atrial size was mildly dilated. Right Atrium: Right atrial size was normal in size. Pericardium: There is no evidence of pericardial effusion. Mitral Valve: The mitral valve is normal in structure. Trivial mitral valve regurgitation. No evidence of mitral valve stenosis. Tricuspid Valve: 26. The tricuspid valve is normal in structure. Tricuspid valve regurgitation is mild . No evidence of tricuspid stenosis. Aortic Valve: The aortic valve is tricuspid. Aortic valve regurgitation is not visualized. No aortic stenosis is  present. Aortic valve mean gradient measures 2.0 mmHg. Aortic valve peak gradient measures 4.1 mmHg. Aortic valve area, by VTI measures 2.01 cm. Pulmonic Valve: The pulmonic valve was not well visualized. Pulmonic valve regurgitation is mild. No evidence of pulmonic stenosis. Aorta: The  aortic root is normal in size and structure. Pulmonary Artery: Indeterminate PASP, inadequate TR jet. Venous: The inferior vena cava is normal in size with greater than 50% respiratory variability, suggesting right atrial pressure of 3 mmHg. IAS/Shunts: No atrial level shunt detected by color flow Doppler. Agitated saline contrast was given intravenously to evaluate for intracardiac shunting. Agitated saline contrast bubble study was negative, with no evidence of any interatrial shunt.  LEFT VENTRICLE PLAX 2D LVIDd:         4.69 cm  Diastology LVIDs:         3.51 cm  LV e' medial:    5.87 cm/s LV PW:         1.25 cm  LV E/e' medial:  13.0 LV IVS:        1.13 cm  LV e' lateral:   7.18 cm/s LVOT diam:     2.00 cm  LV E/e' lateral: 10.7 LV SV:         48 LV SV Index:   24 LVOT Area:     3.14 cm  RIGHT VENTRICLE RV S prime:     10.10 cm/s TAPSE (M-mode): 2.7 cm LEFT ATRIUM             Index       RIGHT ATRIUM           Index LA diam:        4.20 cm 2.10 cm/m  RA Area:     14.10 cm LA Vol (A2C):   66.3 ml 33.22 ml/m RA Volume:   32.60 ml  16.33 ml/m LA Vol (A4C):   67.7 ml 33.92 ml/m LA Biplane Vol: 67.3 ml 33.72 ml/m  AORTIC VALVE AV Area (Vmax):    2.17 cm AV Area (Vmean):   1.97 cm AV Area (VTI):     2.01 cm AV Vmax:           101.35 cm/s AV Vmean:          66.413 cm/s AV VTI:            0.239 m AV Peak Grad:      4.1 mmHg AV Mean Grad:      2.0 mmHg LVOT Vmax:         70.01 cm/s LVOT Vmean:        41.749 cm/s LVOT VTI:          0.153 m LVOT/AV VTI ratio: 0.64  AORTA Ao Root diam: 3.40 cm MITRAL VALVE               TRICUSPID VALVE MV Area (PHT): 4.04 cm    TR Peak grad:   25.8 mmHg MV Decel Time: 188 msec    TR Vmax:         254.00 cm/s MV E velocity: 76.60 cm/s MV A velocity: 34.50 cm/s  SHUNTS MV E/A ratio:  2.22        Systemic VTI:  0.15 m                            Systemic Diam: 2.00 cm Carlyle Dolly MD Electronically signed by Carlyle Dolly MD Signature Date/Time: 02/04/2020/4:19:36 PM    Final    CT HEAD CODE STROKE WO CONTRAST  Result Date: 02/04/2020 CLINICAL DATA:  Code stroke. Confusion, unsteady gait, dragging right foot EXAM: CT HEAD WITHOUT CONTRAST TECHNIQUE: Contiguous axial images were obtained from the base of the skull through the  vertex without intravenous contrast. COMPARISON:  None. FINDINGS: Brain: There is no acute intracranial hemorrhage, mass effect, or edema. Gray-white differentiation is preserved. There are chronic appearing small vessel infarcts the basal ganglia bilaterally as well as the central left frontal white matter. Additional patchy confluent areas of hypoattenuation in the supratentorial white matter are nonspecific probably reflect mild to moderate chronic microvascular ischemic changes. Prominence of the ventricles and sulci reflects parenchymal volume loss. There is extra-axial hyperdensity along the right sphenoid wing and a meningioma is suspected (series 3, image 9). Vascular: No hyperdense vessel. There is intracranial atherosclerotic calcification at the skull base. Skull: Intact. Sinuses/Orbits: No acute abnormality. Other: Bilateral mastoid opacification. ASPECTS ([place] Stroke Program Early CT Score) - Ganglionic level infarction (caudate, lentiform nuclei, internal capsule, insula, M1-M3 cortex): 7 - Supraganglionic infarction (M4-M6 cortex): 3 Total score (0-10 with 10 being normal): 10 IMPRESSION: There is no acute intracranial hemorrhage or evidence of acute infarction. ASPECT score is 10. Chronic appearing small vessel infarcts of the basal ganglia bilaterally and deep left cerebral white matter. Chronic microvascular ischemic changes. Suspected small meningioma along the  right sphenoid wing. MRI could be obtained for confirmation. These results were called by telephone at the time of interpretation on 02/04/2020 at 1:07 pm to provider [facility] [place] ZAMMIT , who verbally acknowledged these results. Electronically Signed   By: Macy Mis M.D.   On: 02/04/2020 13:12   CT ANGIO NECK CODE STROKE  Result Date: 02/04/2020 CLINICAL DATA:  Confusion, unsteady gait, and dragging right foot. EXAM: CT ANGIOGRAPHY HEAD AND NECK TECHNIQUE: Multidetector CT imaging of the head and neck was performed using the standard protocol during bolus administration of intravenous contrast. Multiplanar CT image reconstructions and MIPs were obtained to evaluate the vascular anatomy. Carotid stenosis measurements (when applicable) are obtained utilizing NASCET criteria, using the distal internal carotid diameter as the denominator. CONTRAST:  39mL OMNIPAQUE IOHEXOL 350 MG/ML SOLN COMPARISON:  None. FINDINGS: CTA NECK FINDINGS Aortic arch: Moderate calcified plaque and small amount of soft plaque projecting into the lumen of the distal arch, incompletely imaged. Normal variant aortic arch branching pattern with common origin of the brachiocephalic and left common carotid arteries. No significant arch vessel origin stenosis. Right carotid system: Patent with mild-to-moderate calcified and soft plaque at the carotid bifurcation. No evidence of a significant stenosis or dissection. Left carotid system: Patent with moderate calcified and soft plaque at the carotid bifurcation resulting in 55% proximal ICA stenosis. Tortuous mid cervical ICA. Vertebral arteries: The right vertebral artery is patent and dominant with calcified and soft plaque at its origin resulting in moderate stenosis. The left vertebral artery is occluded at its origin with distal reconstitution beginning at the C3-4 level. Skeleton: Focally advanced disc degeneration at C6-7. Other neck: No evidence of cervical lymphadenopathy. 1.3 cm centrally  hypoattenuating mass in the left parotid gland with nodular peripheral enhancement. Upper chest: Centrilobular emphysema and mild scarring in the lung apices. Review of the MIP images confirms the above findings CTA HEAD FINDINGS Anterior circulation: The internal carotid arteries are patent from skull base to carotid termini with mild atherosclerotic plaque bilaterally not resulting in a significant stenosis. 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 ACAs and MCAs are patent without evidence of a proximal branch occlusion or significant proximal stenosis. No aneurysm is identified. There is a mass along the right sphenoid wing suspicious for a meningioma as noted on today's earlier noncontrast CT. Posterior  circulation: The intracranial vertebral arteries are patent to the basilar with mild stenosis on the right due to atherosclerotic plaque. The right PICA and left AICA appear dominant. Patent SCAs are seen bilaterally. The basilar artery is widely patent. There is a diminutive left posterior communicating artery. The PCAs are patent with mild branch vessel irregularity but no evidence of a significant proximal stenosis. No aneurysm is identified. Venous sinuses: Patent. Anatomic variants: Absent right A1. Review of the MIP images confirms the above findings IMPRESSION: 1. 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 2. 55% proximal left ICA stenosis. 3. Moderate right vertebral artery origin stenosis. 4. Mild intracranial atherosclerosis without significant proximal stenosis or major intracranial arterial occlusion. 5. Suspected meningioma along the right sphenoid wing. 6. 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 7. Aortic Atherosclerosis (ICD10-I70.0) and Emphysema (ICD10-J43.9). Electronically Signed   By: Logan Bores M.D.   On: 02/04/2020 15:09        Scheduled Meds: .  stroke: mapping our  early stages of recovery book   Does not apply Once  . aspirin  300 mg Rectal Daily   Or  . aspirin  325 mg Oral Daily  . atorvastatin  80 mg Oral Daily  . enoxaparin (LOVENOX) injection  40 mg Subcutaneous Q24H   Continuous Infusions: . sodium chloride 10 mL/hr at 02/04/20 1800     LOS: 0 days    Time spent: 30 minutes    Barb Merino, MD [facility] Pager [phone]

## 2020-02-05 NOTE — Progress Notes (Signed)
PT Cancellation Note  Patient Details Name: Darryl Ward MRN: [id number] DOB: July 30, 1945   Cancelled Treatment:    Reason Eval/Treat Not Completed: Patient at procedure or test/unavailable.  Will re-attempt to see pt later today pending his availability.    Melvern Banker 02/05/2020, 9:08 AM  Lavonia Dana, PT   Acute Rehabilitation Services  Pager [phone] Office [phone] 02/05/2020

## 2020-02-06 DIAGNOSIS — I6330 Cerebral infarction due to thrombosis of unspecified cerebral artery: Secondary | ICD-10-CM | POA: Diagnosis not present

## 2020-02-06 DIAGNOSIS — M48061 Spinal stenosis, lumbar region without neurogenic claudication: Secondary | ICD-10-CM | POA: Diagnosis not present

## 2020-02-06 DIAGNOSIS — D320 Benign neoplasm of cerebral meninges: Secondary | ICD-10-CM

## 2020-02-06 DIAGNOSIS — D490 Neoplasm of unspecified behavior of digestive system: Secondary | ICD-10-CM

## 2020-02-06 DIAGNOSIS — E782 Mixed hyperlipidemia: Secondary | ICD-10-CM | POA: Diagnosis not present

## 2020-02-06 DIAGNOSIS — G8191 Hemiplegia, unspecified affecting right dominant side: Secondary | ICD-10-CM | POA: Diagnosis not present

## 2020-02-06 MED ORDER — ATORVASTATIN CALCIUM 80 MG PO TABS: 80.0000 mg | ORAL_TABLET | Freq: Every day | ORAL | 2 refills | Status: DC

## 2020-02-06 MED ORDER — ASPIRIN EC 81 MG PO TBEC: 81.0000 mg | DELAYED_RELEASE_TABLET | Freq: Every day | ORAL | 11 refills | Status: DC

## 2020-02-06 NOTE — Plan of Care (Signed)
Adequate for discharge.

## 2020-02-06 NOTE — Progress Notes (Signed)
Neurosurgery  Called by Dr. 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  His meningioma does not require surgery at this time but he will need surveillance imaging.  With regards to his lumbar stenosis, I will discuss treatment options with the patient.  He will f/u in clinic with me in 2-4 weeks.  I attempted to meet him while he was in the hospital but he had just been discharged.

## 2020-02-06 NOTE — Progress Notes (Signed)
STROKE TEAM PROGRESS NOTE  HPI per record:  Darryl Ward is a 75 y.o. male with medical history of coronary artery disease, hypertension, hyperlipidemia, and stroke presenting with right-sided leg weakness and gait instability when he woke up to go to the bathroom in the morning of 02/04/2020. The patient went to bed around 9 PM on 02/03/2020 and was normal at that time. Patient denied any headache, fevers, chills, visual disturbance, dysarthria, word finding difficulty, or dysesthesias. He subsequently asked his friend to bring him to the emergency department with private vehicle, arriving around 11:30 AM. The patient denies any fevers, chills, chest pain, shortness breath, nausea, vomiting, diarrhea, abdominal pain, dysuria, hematuria. He states that he usually takes aspirin 81 mg daily, but he usually misses at least two doses per week. In the emergency department, the patient states that his right leg weakness is a little bit better. He has no new deficits. He was afebrile and hemodynamically stable with oxygen saturation 100% on room air. BMP, LFTs, and CBC were unremarkable. Notably, the patient had a history of daily alcohol use, but he quit drinking in 2008. He continues to smoke 1/2 pack/day.  INTERVAL HISTORY Patient states he is doing well.  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  He is wanting to go home.  He is willing for outpatient neurosurgery follow-up for his meningioma and consideration for back surgery  Vitals:   02/05/20 1640 02/05/20 2012 02/06/20 0023 02/06/20 0407  BP: 140/74 135/76 (!) 141/75 (!) 128/59  Pulse: (!) 57 63 (!) 59 75  Resp: 18 18 18 19   Temp: 98 F (36.7 C) 97.7 F (36.5 C) 97.9 F (36.6 C) 97.6 F (36.4 C)  TempSrc: Oral  Oral Oral  SpO2: 98% 98% 96% 98%  Weight:      Height:       CBC:  Recent Labs  Lab 02/04/20 1224  WBC 8.3  NEUTROABS 5.8  HGB 13.4  HCT 41.3  MCV  96.3  PLT 854   Basic Metabolic Panel:  Recent Labs  Lab 02/04/20 1224  NA 136  K 3.8  CL 107  CO2 23  GLUCOSE 122*  BUN 17  CREATININE 0.82  CALCIUM 8.8*   Lipid Panel:  Recent Labs  Lab 02/05/20 0755  CHOL 202*  TRIG 99  HDL 29*  CHOLHDL 7.0  VLDL 20  LDLCALC 153*   HgbA1c:  Recent Labs  Lab 02/05/20 0755  HGBA1C 5.1   Urine Drug Screen:  Recent Labs  Lab 02/04/20 1500  LABOPIA NONE DETECTED  COCAINSCRNUR NONE DETECTED  LABBENZ NONE DETECTED  AMPHETMU NONE DETECTED  THCU NONE DETECTED  LABBARB NONE DETECTED    Alcohol Level  Recent Labs  Lab 02/04/20 1224  ETH <10    IMAGING past 24 hours  MRI Brain w contrast 02/05/2019  IMPRESSION: 1. Homogeneously enhancing right sphenoid wing meningioma, up to 2.6 cm long axis. Relative sparing of the right cavernous sinus and no extension into the right orbital apex at this time.  2. No other abnormal intracranial enhancement or dural thickening.  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 Nonemergent ENT consultation could be performed as indicated.  CT Head Code stroke 02/04/2019  IMPRESSION: There is no acute intracranial  hemorrhage or evidence of acute infarction. ASPECT score is 10.  Chronic appearing small vessel infarcts of the basal ganglia bilaterally and deep left cerebral white matter.  Chronic microvascular ischemic changes.  Suspected small meningioma along the right sphenoid wing. MRI could be obtained for confirmation.  CT Angio Head w or wo contrast 02/04/2019  IMPRESSION: 1. 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 2. 55% proximal left ICA stenosis. 3. Moderate right vertebral artery origin stenosis. 4. Mild  intracranial atherosclerosis without significant proximal stenosis or major intracranial arterial occlusion. 5. Suspected meningioma along the right sphenoid wing. 6. 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 7. Aortic Atherosclerosis (ICD10-I70.0) and Emphysema (ICD10-J43.9).  US Carotid Bilateral 02/05/2019  IMPRESSION: 1. Moderate to large amount of left-sided atherosclerotic plaque results in elevated peak systolic velocities within the left internal carotid artery compatible with the higher end of the 50-69% luminal narrowing range, compatible with the findings seen on preceding neck CTA. 2. Moderate amount of right-sided atherosclerotic plaque, not resulting in a hemodynamically significant stenosis. 3. Patent dominant right vertebral artery. 4. Nonvisualization of the left vertebral artery however the left vertebral artery is noted to be reconstituted at the level of the superior aspect of the cervical spine on preceding CTA.  Chest x-ray 02/04/2019  IMPRESSION: Hyperinflation with emphysematous disease and bronchitic changes. Possible vague infiltrates in the right upper lobe and left perihilar lung.   Echo complete Bubble study: 02/04/2019  IMPRESSIONS    1. Left ventricular ejection fraction, by estimation, is 55 to 60%. The  left ventricle has normal function. The left ventricle has no regional  wall motion abnormalities. There is mild left ventricular hypertrophy.  Left ventricular diastolic parameters  are indeterminate.  2. Right ventricular systolic function is normal. The right ventricular  size is normal.  3. Left atrial size was mildly dilated.  4. The mitral valve is normal in structure. Trivial mitral valve  regurgitation. No evidence of mitral stenosis.  5. The aortic valve is tricuspid. Aortic valve regurgitation is not  visualized. No aortic stenosis is present.  6. The inferior vena cava is normal in size with  greater than 50%  respiratory variability, suggesting right atrial pressure of 3 mmHg.  7. Agitated saline contrast bubble study was negative, with no evidence  of any interatrial shunt.   Echocardiogram complete  02/05/2019  IMPRESSIONS    1. Left ventricular ejection fraction, by estimation, is 60 to 65%. The  left ventricle has normal function. The left ventricle has no regional  wall motion abnormalities. Left ventricular diastolic parameters were  normal.  2. Right ventricular systolic function is normal. The right ventricular  size is normal.  3. Left atrial size was moderately dilated.  4. The mitral valve is normal in structure. No evidence of mitral valve  regurgitation. No evidence of mitral stenosis.  5. The aortic valve is tricuspid. Aortic valve regurgitation is not  visualized. No aortic stenosis is present.  6. The inferior vena cava is normal in size with greater than 50%  respiratory variability, suggesting right atrial pressure of 3 mmHg.      PHYSICAL EXAM General: Well developed well nourished elderly Caucasian male sitting comfortably in bed, no acute distress HEENT: [place]/AT, MMM Cardiovascular: Regular rate and rhythm Pulmonary: Clear to auscultation bilaterally, no respiratory discomfort Abdomen: Soft, nontender, no rigidity Extremities: Warm, no peripheral edema Neurological: General: NAD Mental Status: Alert, oriented, thought content appropriate.  Speech fluent  without evidence of aphasia.  Able to follow 3 step commands without difficulty. Cranial Nerves: II:  Visual fields grossly normal, pupils equal, round, reactive to light and accommodation III,IV, VI: ptosis not present, extra-ocular motions intact bilaterally V,VII: smile symmetric, facial light touch sensation normal bilaterally VIII: hearing normal bilaterally IX,X: uvula rises symmetrically XI: bilateral shoulder shrug XII: midline tongue extension without atrophy or  fasciculations  Motor: Right : Upper extremity   5/5    Left:     Upper extremity   5/5  Lower extremity   5/5     Lower extremity   5/5 Tone slightly increased in therightleg.  Mild weakness of right ankle dorsiflexors and hip flexors only.  No atrophy noted Sensory: Pinprick and light touch intact throughout, bilaterally Deep Tendon Reflexes:  Right: Upper Extremity   Left: Upper extremity   biceps (C-5 to C-6) 3/4   biceps (C-5 to C-6) 2/4 tricep (C7) 3/4    triceps (C7) 2/4 Brachioradialis (C6) 3/4  Brachioradialis (C6) 2/4  Lower Extremity Lower Extremity  quadriceps (L-2 to L-4) 3/4   quadriceps (L-2 to L-4) 2/4 Achilles (S1) 3/4   Achilles (S1) 2/4  Plantars: Right: downgoing   Left: downgoing Cerebellar: normal finger-to-nose,  normal heel-to-shin test Gait: Slight dragging of right leg with slight spasticity on right   ASSESSMENT/PLAN Mr. KWALI WRINKLE is a 75 y.o. male with history of stroke, hyperlipidemia, hypertension, coronary artery disease presented with right leg weakness.  Imaging studies does not show any acute infarct.  No tPA given, out of time window.   Possible previous stroke deficit flare up vs degenerative lumbar spine disease causing right leg weakness .asymptomatic right sphenoid wing meningioma likely unrelated to presentation   CT head There is no acute intracranial hemorrhage or evidence of acute infarction. ASPECT score is 10.  CTA head & neck: Occlusion of the left vertebral artery at its origin with distal reconstitution.55% proximal left ICA stenosis. 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  MRI: No acute intracranial finding  MR w contrast: Homogeneously enhancing right sphenoid wing meningioma, up to 2.6  cm long axis. Relative sparing of the right cavernous sinus and no extension into the right orbital apex at this time  Carotid Doppler: Moderate to large amount of left-sided atherosclerotic plaque results in elevated peak systolic velocities within the left internal carotid artery compatible with the higher end of the 50-69% luminal narrowing range, compatible with the findings seen onpreceding neck CTA.  2D Echo: Left ventricular ejection fraction, by estimation, is 60 to 65%. The  left ventricle has normal function  Echo complete Bubble study: Left ventricular ejection fraction, by estimation, is 55 to 60%. The  left ventricle has normal function.   MR Lumbar spine wo contrast: Severe bilateral L5-S1 and moderate L4-L5 right-sided foraminal stenosis   LDL 139  HgbA1c 5.1  VTE prophylaxis -Lovenox 40 mg    Diet   Diet Heart Room service appropriate? Yes with Assist; Fluid consistency: Thin    aspirin 81 mg daily prior to admission, now on aspirin 81 mg daily.   Therapy recommendations: Outpatient PT after NS consult  Disposition: Likely home  Hypertension  Home meds: Metoprolol 25 mg  Stable . Permissive hypertension (OK if < 220/120) but gradually normalize in 5-7 days . 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  MR w contrast: Homogeneously enhancing right sphenoid wing meningioma, up to 2.6 cm long axis. Relative sparing of the right cavernous sinus and no extension into the right orbital apex at this time  Currently asymptomatic  Follow-up as an outpatient   Other Stroke Risk Factors  Advanced Age >/= 68   Former Cigarette smoker ( 30-pack year history)  Hx stroke: No records  available  Coronary artery disease   Other Active Problems  Peripheral vascular disease, unspecified  Chronic midline low back pain without sciatica: Lidocaine patch  Abdominal aortic aneurysm s/p repair  Hospital day # 0 Continue aspirin for stroke prevention.  Patient to be discharged home but patient will need elective referral to neurosurgery for his meningioma which can be followed conservatively at the present time.  Continue  aggressive risk factor modification.     Discussed with Dr. Barb Merino and Dr. Duffy Rhody neurosurgery greater than 50% time during 25-minute visit was spent in counseling and coordination of care about his leg weakness and discussion of MRI findings and further evaluation answering questions Antony Contras, MD   To contact Stroke Continuity provider, please refer to [email or web address] After hours, contact General Neurology

## 2020-02-06 NOTE — Discharge Summary (Signed)
Physician Discharge Summary  NATHANIAL ARRIGHI XLK:[id number] DOB: [birth year] DOA: 02/04/2020  PCP: [name], FNP  Admit date: 02/04/2020 Discharge date: 02/06/2020  Admitted From: Home Disposition: Home  Recommendations for Outpatient Follow-up:  1. Follow up with PCP in 1-2 weeks 2. Please obtain BMP/CBC in one week 3. We will send referral to ENT and neurosurgery for follow-up.  Office will call. Home Health: Not applicable Equipment/Devices: Walker with cushion  Discharge Condition: Stable CODE STATUS: Full code Diet recommendation: Low-salt, low-cholesterol diet and daily  Discharge Summary: 75 year old gentleman with history of coronary artery disease, hypertension, hyperlipidemia, history of stroke with no residual weakness presented with right-sided leg weakness and gait instability that he woke up in the morning with.  Went to bed on 1/13 9 PM with no symptoms, woke up in the morning with dragging her right leg, called his friend and rode in a private vehicle to the ER on 11:30 AM in the emergency room, hemodynamically stable.  Right leg weakness already improved.  No new deficits.  He was not a tPA or neurosurgical intervention candidate.   Patient was admitted to the hospital with suspected stroke, however was negative for acute stroke.  He was thought to have exacerbation of symptoms and right leg weakness related to lumbar spinal stenosis.  #1 .suspected left MCA stroke, with history of stroke and right lower extremity spasticity: Clinical findings, weakness of the right leg that has been improved.  He does have some baseline chronic spasticity.   CT head findings, chronic ischemic changes MRI of the brain, chronic ischemic changes, 2 cm right sphenoid meningioma MRI brain with contrast, confirms meningioma with no local pressure. CTA of the head and neck, major artery stenosis, good collaterals.  No LVO. 2D echocardiogram, no source of embolism.  Normal ejection  fraction. Antiplatelet therapy, intermittently on aspirin.  Started on aspirin 81 mg daily. LDL, 153. On Crestor 20 mg daily at home , starting on a high intensity statin Lipitor 80 mg daily. Hemoglobin A1c, 5.1.  No indication for treatment. Therapy recommendations, outpatient follow-up. Smoking cessation counseling done. MRI lumbar spine shows severe L5/S1 stenosis, no cord compression. We will send outpatient referral to neurosurgery to follow-up for meningioma and spinal stenosis.  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  Dedicated soft tissue ultrasound shows partially cystic and partially solid mass.  Likely benign complex cyst. We will send to ENT on discharge.  Patient is medically stable.  He is going home today.  Will arrange outpatient follow-up with neurosurgery and ENT.   Discharge Diagnoses:  Active Problems:   Mixed hyperlipidemia   Coronary atherosclerosis of native coronary artery   Tobacco abuse   Right hemiparesis (HCC)   Cerebral thrombosis with cerebral infarction   Meningioma, cerebral [facility])   Tumor of parotid gland   Lumbar spinal stenosis    Discharge Instructions  Discharge Instructions    Ambulatory referral to ENT   Complete by: As directed    Left parotid tumor   Ambulatory referral to Neurosurgery   Complete by: As directed    Follow-up meningioma and lumbar spinal stenosis.   Diet - low sodium heart healthy   Complete by: As directed    Increase activity slowly   Complete by: As directed      Allergies as of 02/06/2020   No Known Allergies     Medication List    STOP taking these medications   rosuvastatin 10 MG tablet Commonly known as: Crestor  TAKE these medications   aspirin EC 81 MG tablet Take 1 tablet (81 mg total) by mouth daily. What changed: when to take this   atorvastatin 80 MG tablet Commonly known as: LIPITOR Take 1 tablet (80 mg total) by mouth daily. Start taking on: February 07, 2020   celecoxib 200 MG capsule Commonly known as: CELEBREX Take 1 capsule (200 mg total) by mouth 2 (two) times daily.   metoprolol succinate 25 MG 24 hr tablet Commonly known as: TOPROL-XL Take 1 tablet (25 mg total) by mouth daily.            Durable Medical Equipment  (From admission, onward)         Start     Ordered   02/06/20 1017  For home use only DME 4 wheeled rolling walker with seat  Once       Question:  Patient needs a walker to treat with the following condition  Answer:  Lumbar spine root compression   02/06/20 1017          Follow-up Information    Hassell Done, Mary-Margaret, FNP Follow up in 1 week(s).   Specialty: Family Medicine Contact information: [place] [address] [phone]              No Known Allergies  Consultations:  Neurology   Procedures/Studies: CT ANGIO HEAD W OR WO CONTRAST  Result Date: 02/04/2020 CLINICAL DATA:  Confusion, unsteady gait, and dragging right foot. EXAM: CT ANGIOGRAPHY HEAD AND NECK TECHNIQUE: Multidetector CT imaging of the head and neck was performed using the standard protocol during bolus administration of intravenous contrast. Multiplanar CT image reconstructions and MIPs were obtained to evaluate the vascular anatomy. Carotid stenosis measurements (when applicable) are obtained utilizing NASCET criteria, using the distal internal carotid diameter as the denominator. CONTRAST:  36mL OMNIPAQUE IOHEXOL 350 MG/ML SOLN COMPARISON:  None. FINDINGS: CTA NECK FINDINGS Aortic arch: Moderate calcified plaque and small amount of soft plaque projecting into the lumen of the distal arch, incompletely imaged. Normal variant aortic arch branching pattern with common origin of the brachiocephalic and left common carotid arteries. No significant arch vessel origin stenosis. Right carotid system: Patent with mild-to-moderate calcified and soft plaque at the carotid bifurcation. No evidence of a  significant stenosis or dissection. Left carotid system: Patent with moderate calcified and soft plaque at the carotid bifurcation resulting in 55% proximal ICA stenosis. Tortuous mid cervical ICA. Vertebral arteries: The right vertebral artery is patent and dominant with calcified and soft plaque at its origin resulting in moderate stenosis. The left vertebral artery is occluded at its origin with distal reconstitution beginning at the C3-4 level. Skeleton: Focally advanced disc degeneration at C6-7. Other neck: No evidence of cervical lymphadenopathy. 1.3 cm centrally hypoattenuating mass in the left parotid gland with nodular peripheral enhancement. Upper chest: Centrilobular emphysema and mild scarring in the lung apices. Review of the MIP images confirms the above findings CTA HEAD FINDINGS Anterior circulation: The internal carotid arteries are patent from skull base to carotid termini with mild atherosclerotic plaque bilaterally not resulting in a significant stenosis. 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 ACAs and MCAs are patent without evidence of a proximal branch occlusion or significant proximal stenosis. No aneurysm is identified. There is a mass along the right sphenoid wing suspicious for a meningioma as noted on today's earlier noncontrast CT. Posterior circulation: The intracranial vertebral arteries are patent  to the basilar with mild stenosis on the right due to atherosclerotic plaque. The right PICA and left AICA appear dominant. Patent SCAs are seen bilaterally. The basilar artery is widely patent. There is a diminutive left posterior communicating artery. The PCAs are patent with mild branch vessel irregularity but no evidence of a significant proximal stenosis. No aneurysm is identified. Venous sinuses: Patent. Anatomic variants: Absent right A1. Review of the MIP images confirms the above findings IMPRESSION: 1. Occlusion of the left  vertebral artery at its origin with distal reconstitution. 2. 55% proximal left ICA stenosis. 3. Moderate right vertebral artery origin stenosis. 4. Mild intracranial atherosclerosis without significant proximal stenosis or major intracranial arterial occlusion. 5. Suspected meningioma along the right sphenoid wing. 6. 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 7. Aortic Atherosclerosis (ICD10-I70.0) and Emphysema (ICD10-J43.9). Electronically Signed   By: Logan Bores M.D.   On: 02/04/2020 15:09   DG Chest 2 View  Result Date: 02/04/2020 CLINICAL DATA:  Right-sided weakness EXAM: CHEST - 2 VIEW COMPARISON:  10/27/2018, CT 12/03/2019 FINDINGS: Hyperinflation with emphysematous disease. Possible vague infiltrates in the right upper lobe and left perihilar lung. No pleural effusion or pneumothorax. Normal heart size with aortic atherosclerosis. No pneumothorax. IMPRESSION: Hyperinflation with emphysematous disease and bronchitic changes. Possible vague infiltrates in the right upper lobe and left perihilar lung. Electronically Signed   By: Donavan Foil M.D.   On: 02/04/2020 17:45   MR BRAIN WO CONTRAST  Result Date: 02/04/2020 CLINICAL DATA:  Right-sided weakness and gait instability. EXAM: MRI HEAD WITHOUT CONTRAST TECHNIQUE: Multiplanar, multiecho pulse sequences of the brain and surrounding structures were obtained without intravenous contrast. COMPARISON:  CT studies same day FINDINGS: Brain: Diffusion imaging does not show any acute or subacute infarction. No focal abnormality affects the brainstem. There is some cerebellar atrophy without evidence of a focal stroke. Cerebral hemispheres show generalized volume loss with chronic small-vessel ischemic changes of the white matter. Old lacunar infarction in the right basal ganglia. Some high signal on the DWI trace image does not represent true restricted diffusion based on the ADC map. No large vessel territory infarction. No  intra-axial mass, recent hemorrhage, hydrocephalus or extra-axial collection. Meningioma of the right sphenoid wing measuring approximately 2.3 x 2.1 x 1.7 cm. No significant mass-effect upon the brain. This is close to the orbital apex. Vascular: Major vessels at the base of the brain show flow. Skull and upper cervical spine: Negative Sinuses/Orbits: Sinuses are clear.  Orbits are negative. Other: Bilateral mastoid effusions. 1 cm left parotid cystic area could be a benign cyst or a cystic tumor. This does not show aggressive features. IMPRESSION: 1. No acute intracranial finding. Generalized brain atrophy. Chronic small-vessel ischemic changes of the cerebral hemispheric white matter. Old lacunar infarction of the right basal ganglia. High signal in the right caudate head does not represent true restricted diffusion. 2. 2.3 x 2.1 x 1.7 cm meningioma of the right sphenoid wing without significant mass-effect upon the brain. This is close to the orbital apex. 3. Bilateral mastoid effusions. 1 cm left parotid cystic area could be a benign cyst or cystic tumor. Electronically Signed   By: Nelson Chimes M.D.   On: 02/04/2020 16:38   MR BRAIN W CONTRAST  Result Date: 02/05/2020 CLINICAL DATA:  75 year old male with recent right side weakness and gait instability. 2.3 cm right sphenoid wing meningioma on recent CT and noncontrast brain MRI. EXAM: MRI HEAD WITH CONTRAST TECHNIQUE: Multiplanar, multiecho pulse  sequences of the brain and surrounding structures were obtained with intravenous contrast. CONTRAST:  8.61mL GADAVIST GADOBUTROL 1 MMOL/ML IV SOLN COMPARISON:  Noncontrast brain MRI yesterday. FINDINGS: The major dural venous sinuses are enhancing and appear to be patent. Lobulated dural-based solidly enhancing extra-axial mass of the right sphenoid wing corresponding to the noncontrast finding yesterday encompasses 20 x 26 x 20 mm (AP by transverse by CC). Regional dural thickening (series 6, image 25). Stable  mild mass effect on the adjacent right temporal lobe as before. Relative sparing of the right cavernous sinus. The lesion abuts but does not extend into the right orbital apex at this time. No other abnormal intracranial enhancement or dural thickening identified. T1 signal appearance of brain parenchyma otherwise stable from yesterday. Other findings: The small T2 hyperintense lesion of the left parotid gland seen yesterday demonstrates little if any enhancement (series 7, image 16), benign cyst or benign mixed tumor of the parotid favored. IMPRESSION: 1. Homogeneously enhancing right sphenoid wing meningioma, up to 2.6 cm long axis. Relative sparing of the right cavernous sinus and no extension into the right orbital apex at this time. 2. No other abnormal intracranial enhancement or dural thickening. Electronically Signed   By: Genevie Ann M.D.   On: 02/05/2020 10:28   MR LUMBAR SPINE WO CONTRAST  Result Date: 02/05/2020 CLINICAL DATA:  Right lower extremity weakness and ataxia EXAM: MRI LUMBAR SPINE WITHOUT CONTRAST TECHNIQUE: Multiplanar, multisequence MR imaging of the lumbar spine was performed. No intravenous contrast was administered. COMPARISON:  None. FINDINGS: Segmentation: There is transitional lumbosacral anatomy. There is a rudimentary disc space at S1-S2. Alignment:  Normal Vertebrae:  Schmorl's node at the inferior endplate of L2 and L4. Conus medullaris and cauda equina: Conus extends to the L2 level. Conus and cauda equina appear normal. Paraspinal and other soft tissues: Infrarenal abdominal aortic aneurysm, as previously demonstrated, measuring 3.5 cm, previously 5.3 cm on 12/10/2011. Disc levels: L1-L2: Small disc bulge. No spinal canal stenosis. No neural foraminal stenosis. L2-L3: Small disc bulge. No spinal canal stenosis. No neural foraminal stenosis. L3-L4: Normal disc space and facet joints. No spinal canal stenosis. No neural foraminal stenosis. L4-L5: Disc desiccation with small bulge.  No spinal canal stenosis. Moderate right and mild left neural foraminal stenosis. L5-S1: Intermediate sized disc bulge. Mild facet hypertrophy. No spinal canal stenosis. Severe bilateral neural foraminal stenosis. S1-S2: Rudimentary disc space. No spinal canal or neural foraminal stenosis. Visualized sacrum: Normal. IMPRESSION: 1. Transitional lumbosacral anatomy with rudimentary disc space at S1-S2. 2. Severe bilateral L5-S1 neural foraminal stenosis. 3. Moderate right L4-L5 neural foraminal stenosis. 4. No spinal canal stenosis. 5. Infrarenal abdominal aortic aneurysm, as previously demonstrated, measuring 3.5 cm, previously 5.3 cm on 12/10/2011. Recommend follow-up ultrasound every 2 years. This recommendation follows ACR consensus guidelines: White Paper of the ACR Incidental Findings Committee II on Vascular Findings. J Am Coll Radiol 2013; 10:789-794. Electronically Signed   By: Ulyses Jarred M.D.   On: 02/05/2020 23:04   US SOFT TISSUE HEAD & NECK (NON-THYROID)  Result Date: 02/05/2020 CLINICAL DATA:  Evaluate left-sided parotid nodule demonstrated on preceding neck CTA EXAM: ULTRASOUND OF HEAD/NECK SOFT TISSUES TECHNIQUE: Ultrasound examination of the head and neck soft tissues was performed in the area of clinical concern. COMPARISON:  Neck CTA-02/04/2020 FINDINGS: Redemonstrated approximately 1.2 x 1.0 x 0.8 cm partially cystic, partially solid hypoechoic well-defined nodule within the deep portion of the left parotid gland correlating with the nodule seen on preceding neck CTA image 100,  series 4. No definitive blood flow is demonstrated within this nodule and there is no associated parotid ductal dilatation. Otherwise, normal appearance of the left parotid gland. Limited visualization the contralateral right parotid gland, scanned for comparison purposes, is normal. 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 Nonemergent ENT consultation could be performed as indicated. Electronically Signed   By: Sandi Mariscal M.D.   On: 02/05/2020 09:39   US Carotid Bilateral (at [facility] and [facility] only)  Result Date: 02/05/2020 CLINICAL DATA:  History of carotid artery stenosis. History TIA, hypertension, CAD, and hyperlipidemia. Former smoker. EXAM: BILATERAL CAROTID DUPLEX ULTRASOUND TECHNIQUE: Pearline Cables scale imaging, color Doppler and duplex ultrasound were performed of bilateral carotid and vertebral arteries in the neck. COMPARISON:  CTA head and neck - earlier same day FINDINGS: Criteria: Quantification of carotid stenosis is based on velocity parameters that correlate the residual internal carotid diameter with NASCET-based stenosis levels, using the diameter of the distal internal carotid lumen as the denominator for stenosis measurement. The following velocity measurements were obtained: RIGHT ICA: 111/27 cm/sec CCA: 46/50 cm/sec SYSTOLIC ICA/CCA RATIO:  0.8 ECA: 76 cm/sec LEFT ICA: 205/41 cm/sec CCA: 35/46 cm/sec SYSTOLIC ICA/CCA RATIO:  2.5 ECA: 85 cm/sec RIGHT CAROTID ARTERY: There is a moderate amount of eccentric echogenic plaque within the right carotid bulb (image 14), not resulting in elevated peak systolic velocities within the interrogated course the right internal carotid artery to suggest a hemodynamically significant stenosis. RIGHT VERTEBRAL ARTERY:  Antegrade flow LEFT CAROTID ARTERY: There is a moderate amount of eccentric echogenic plaque within the left carotid bulb (image 47). There is a moderate to large amount of atherosclerotic plaque involving the origin and proximal aspects of the left internal carotid artery (image 57), which results in elevated peak systolic velocities within the proximal and mid aspects of the left internal carotid artery. Greatest acquired peak systolic velocity within mid left ICA measures 205  centimeters/second (image 62). LEFT VERTEBRAL ARTERY: Not visualized - note, the right vertebral artery is noted to be dominant with reconstitution of an atretic left vertebral artery at the level of the superior aspect of the cervical spine as demonstrated on preceding CTA IMPRESSION: 1. Moderate to large amount of left-sided atherosclerotic plaque results in elevated peak systolic velocities within the left internal carotid artery compatible with the higher end of the 50-69% luminal narrowing range, compatible with the findings seen on preceding neck CTA. 2. Moderate amount of right-sided atherosclerotic plaque, not resulting in a hemodynamically significant stenosis. 3. Patent dominant right vertebral artery. 4. Nonvisualization of the left vertebral artery however the left vertebral artery is noted to be reconstituted at the level of the superior aspect of the cervical spine on preceding CTA. Electronically Signed   By: Sandi Mariscal M.D.   On: 02/05/2020 08:14   ECHOCARDIOGRAM COMPLETE  Result Date: 02/05/2020    ECHOCARDIOGRAM REPORT   Patient Name:   DELSIN COPEN Date of Exam: 02/05/2020 Medical Rec #:  [id number]        Height:       70.0 in Accession #:    [id number]       Weight:       180.0 lb Date of Birth:  03-18-1945         BSA:          1.996 m Patient Age:    46 years  BP:           132/85 mmHg Patient Gender: M                HR:           60 bpm. Exam Location:  Inpatient Procedure: 2D Echo, Cardiac Doppler and Color Doppler Indications:    Stroke  History:        Patient has no prior history of Echocardiogram examinations.  Sonographer:    Merrie Roof RDCS Referring Phys: [phone] DAVID TAT IMPRESSIONS  1. Left ventricular ejection fraction, by estimation, is 60 to 65%. The left ventricle has normal function. The left ventricle has no regional wall motion abnormalities. Left ventricular diastolic parameters were normal.  2. Right ventricular systolic function is normal. The right  ventricular size is normal.  3. Left atrial size was moderately dilated.  4. The mitral valve is normal in structure. No evidence of mitral valve regurgitation. No evidence of mitral stenosis.  5. The aortic valve is tricuspid. Aortic valve regurgitation is not visualized. No aortic stenosis is present.  6. The inferior vena cava is normal in size with greater than 50% respiratory variability, suggesting right atrial pressure of 3 mmHg. FINDINGS  Left Ventricle: Left ventricular ejection fraction, by estimation, is 60 to 65%. The left ventricle has normal function. The left ventricle has no regional wall motion abnormalities. The left ventricular internal cavity size was normal in size. There is  no left ventricular hypertrophy. Left ventricular diastolic parameters were normal. Right Ventricle: The right ventricular size is normal. No increase in right ventricular wall thickness. Right ventricular systolic function is normal. Left Atrium: Left atrial size was moderately dilated. Right Atrium: Right atrial size was normal in size. Pericardium: There is no evidence of pericardial effusion. Mitral Valve: The mitral valve is normal in structure. There is mild thickening of the mitral valve leaflet(s). There is mild calcification of the mitral valve leaflet(s). No evidence of mitral valve regurgitation. No evidence of mitral valve stenosis. Tricuspid Valve: The tricuspid valve is normal in structure. Tricuspid valve regurgitation is mild . No evidence of tricuspid stenosis. Aortic Valve: The aortic valve is tricuspid. Aortic valve regurgitation is not visualized. No aortic stenosis is present. Pulmonic Valve: The pulmonic valve was normal in structure. Pulmonic valve regurgitation is not visualized. No evidence of pulmonic stenosis. Aorta: The aortic root is normal in size and structure. Venous: The inferior vena cava is normal in size with greater than 50% respiratory variability, suggesting right atrial pressure of 3  mmHg. IAS/Shunts: No atrial level shunt detected by color flow Doppler.  LEFT VENTRICLE PLAX 2D LVIDd:         5.20 cm     Diastology LVIDs:         3.50 cm     LV e' medial:    8.27 cm/s LV PW:         1.00 cm     LV E/e' medial:  10.2 LV IVS:        1.00 cm     LV e' lateral:   8.81 cm/s LVOT diam:     2.20 cm     LV E/e' lateral: 9.5 LV SV:         79 LV SV Index:   40 LVOT Area:     3.80 cm  LV Volumes (MOD) LV vol d, MOD A4C: 63.7 ml LV vol s, MOD A4C: 28.1 ml LV SV MOD A4C:  63.7 ml RIGHT VENTRICLE RV Basal diam:  4.10 cm RV Mid diam:    3.10 cm LEFT ATRIUM             Index       RIGHT ATRIUM           Index LA diam:        4.90 cm 2.46 cm/m  RA Area:     19.50 cm LA Vol (A2C):   74.9 ml 37.53 ml/m RA Volume:   52.80 ml  26.45 ml/m LA Vol (A4C):   74.8 ml 37.48 ml/m LA Biplane Vol: 77.2 ml 38.68 ml/m  AORTIC VALVE LVOT Vmax:   96.60 cm/s LVOT Vmean:  60.100 cm/s LVOT VTI:    0.208 m  AORTA Ao Root diam: 3.90 cm Ao Asc diam:  3.30 cm MITRAL VALVE               TRICUSPID VALVE MV Area (PHT): 3.33 cm    TR Peak grad:   26.4 mmHg MV Decel Time: 228 msec    TR Vmax:        257.00 cm/s MV E velocity: 84.00 cm/s MV A velocity: 43.70 cm/s  SHUNTS MV E/A ratio:  1.92        Systemic VTI:  0.21 m                            Systemic Diam: 2.20 cm Jenkins Rouge MD Electronically signed by Jenkins Rouge MD Signature Date/Time: 02/05/2020/9:30:48 AM    Final    ECHOCARDIOGRAM COMPLETE BUBBLE STUDY  Result Date: 02/04/2020    ECHOCARDIOGRAM REPORT   Patient Name:   MALAKY TETRAULT Date of Exam: 02/04/2020 Medical Rec #:  [id number]        Height:       70.0 in Accession #:    [id number]       Weight:       180.0 lb Date of Birth:  1945-04-06         BSA:          1.996 m Patient Age:    59 years         BP:           165/82 mmHg Patient Gender: M                HR:           63 bpm. Exam Location:  [facility] Procedure: 2D Echo Indications:    Stroke 434.91 / I63.9  History:        Patient has no prior history of  Echocardiogram examinations.                 Stroke; Risk Factors:Dyslipidemia and Current Smoker. Right                 Hemiparesis, Aneurysm of Abdominal.  Sonographer:    Leavy Cella RDCS (AE) Referring Phys: [phone] ERIC LINDZEN IMPRESSIONS  1. Left ventricular ejection fraction, by estimation, is 55 to 60%. The left ventricle has normal function. The left ventricle has no regional wall motion abnormalities. There is mild left ventricular hypertrophy. Left ventricular diastolic parameters are indeterminate.  2. Right ventricular systolic function is normal. The right ventricular size is normal.  3. Left atrial size was mildly dilated.  4. The mitral valve is normal in structure. Trivial mitral valve regurgitation. No evidence of mitral stenosis.  5. The  aortic valve is tricuspid. Aortic valve regurgitation is not visualized. No aortic stenosis is present.  6. The inferior vena cava is normal in size with greater than 50% respiratory variability, suggesting right atrial pressure of 3 mmHg.  7. Agitated saline contrast bubble study was negative, with no evidence of any interatrial shunt. FINDINGS  Left Ventricle: Left ventricular ejection fraction, by estimation, is 55 to 60%. The left ventricle has normal function. The left ventricle has no regional wall motion abnormalities. The left ventricular internal cavity size was normal in size. There is  mild left ventricular hypertrophy. Left ventricular diastolic parameters are indeterminate. Right Ventricle: The right ventricular size is normal. No increase in right ventricular wall thickness. Right ventricular systolic function is normal. Left Atrium: Left atrial size was mildly dilated. Right Atrium: Right atrial size was normal in size. Pericardium: There is no evidence of pericardial effusion. Mitral Valve: The mitral valve is normal in structure. Trivial mitral valve regurgitation. No evidence of mitral valve stenosis. Tricuspid Valve: 26. The tricuspid valve is  normal in structure. Tricuspid valve regurgitation is mild . No evidence of tricuspid stenosis. Aortic Valve: The aortic valve is tricuspid. Aortic valve regurgitation is not visualized. No aortic stenosis is present. Aortic valve mean gradient measures 2.0 mmHg. Aortic valve peak gradient measures 4.1 mmHg. Aortic valve area, by VTI measures 2.01 cm. Pulmonic Valve: The pulmonic valve was not well visualized. Pulmonic valve regurgitation is mild. No evidence of pulmonic stenosis. Aorta: The aortic root is normal in size and structure. Pulmonary Artery: Indeterminate PASP, inadequate TR jet. Venous: The inferior vena cava is normal in size with greater than 50% respiratory variability, suggesting right atrial pressure of 3 mmHg. IAS/Shunts: No atrial level shunt detected by color flow Doppler. Agitated saline contrast was given intravenously to evaluate for intracardiac shunting. Agitated saline contrast bubble study was negative, with no evidence of any interatrial shunt.  LEFT VENTRICLE PLAX 2D LVIDd:         4.69 cm  Diastology LVIDs:         3.51 cm  LV e' medial:    5.87 cm/s LV PW:         1.25 cm  LV E/e' medial:  13.0 LV IVS:        1.13 cm  LV e' lateral:   7.18 cm/s LVOT diam:     2.00 cm  LV E/e' lateral: 10.7 LV SV:         48 LV SV Index:   24 LVOT Area:     3.14 cm  RIGHT VENTRICLE RV S prime:     10.10 cm/s TAPSE (M-mode): 2.7 cm LEFT ATRIUM             Index       RIGHT ATRIUM           Index LA diam:        4.20 cm 2.10 cm/m  RA Area:     14.10 cm LA Vol (A2C):   66.3 ml 33.22 ml/m RA Volume:   32.60 ml  16.33 ml/m LA Vol (A4C):   67.7 ml 33.92 ml/m LA Biplane Vol: 67.3 ml 33.72 ml/m  AORTIC VALVE AV Area (Vmax):    2.17 cm AV Area (Vmean):   1.97 cm AV Area (VTI):     2.01 cm AV Vmax:           101.35 cm/s AV Vmean:          66.413 cm/s AV  VTI:            0.239 m AV Peak Grad:      4.1 mmHg AV Mean Grad:      2.0 mmHg LVOT Vmax:         70.01 cm/s LVOT Vmean:        41.749 cm/s LVOT VTI:           0.153 m LVOT/AV VTI ratio: 0.64  AORTA Ao Root diam: 3.40 cm MITRAL VALVE               TRICUSPID VALVE MV Area (PHT): 4.04 cm    TR Peak grad:   25.8 mmHg MV Decel Time: 188 msec    TR Vmax:        254.00 cm/s MV E velocity: 76.60 cm/s MV A velocity: 34.50 cm/s  SHUNTS MV E/A ratio:  2.22        Systemic VTI:  0.15 m                            Systemic Diam: 2.00 cm Carlyle Dolly MD Electronically signed by Carlyle Dolly MD Signature Date/Time: 02/04/2020/4:19:36 PM    Final    CT HEAD CODE STROKE WO CONTRAST  Result Date: 02/04/2020 CLINICAL DATA:  Code stroke. Confusion, unsteady gait, dragging right foot EXAM: CT HEAD WITHOUT CONTRAST TECHNIQUE: Contiguous axial images were obtained from the base of the skull through the vertex without intravenous contrast. COMPARISON:  None. FINDINGS: Brain: There is no acute intracranial hemorrhage, mass effect, or edema. Gray-white differentiation is preserved. There are chronic appearing small vessel infarcts the basal ganglia bilaterally as well as the central left frontal white matter. Additional patchy confluent areas of hypoattenuation in the supratentorial white matter are nonspecific probably reflect mild to moderate chronic microvascular ischemic changes. Prominence of the ventricles and sulci reflects parenchymal volume loss. There is extra-axial hyperdensity along the right sphenoid wing and a meningioma is suspected (series 3, image 9). Vascular: No hyperdense vessel. There is intracranial atherosclerotic calcification at the skull base. Skull: Intact. Sinuses/Orbits: No acute abnormality. Other: Bilateral mastoid opacification. ASPECTS ([place] Stroke Program Early CT Score) - Ganglionic level infarction (caudate, lentiform nuclei, internal capsule, insula, M1-M3 cortex): 7 - Supraganglionic infarction (M4-M6 cortex): 3 Total score (0-10 with 10 being normal): 10 IMPRESSION: There is no acute intracranial hemorrhage or evidence of acute  infarction. ASPECT score is 10. Chronic appearing small vessel infarcts of the basal ganglia bilaterally and deep left cerebral white matter. Chronic microvascular ischemic changes. Suspected small meningioma along the right sphenoid wing. MRI could be obtained for confirmation. These results were called by telephone at the time of interpretation on 02/04/2020 at 1:07 pm to provider [facility] ZAMMIT , who verbally acknowledged these results. Electronically Signed   By: Macy Mis M.D.   On: 02/04/2020 13:12   CT ANGIO NECK CODE STROKE  Result Date: 02/04/2020 CLINICAL DATA:  Confusion, unsteady gait, and dragging right foot. EXAM: CT ANGIOGRAPHY HEAD AND NECK TECHNIQUE: Multidetector CT imaging of the head and neck was performed using the standard protocol during bolus administration of intravenous contrast. Multiplanar CT image reconstructions and MIPs were obtained to evaluate the vascular anatomy. Carotid stenosis measurements (when applicable) are obtained utilizing NASCET criteria, using the distal internal carotid diameter as the denominator. CONTRAST:  66mL OMNIPAQUE IOHEXOL 350 MG/ML SOLN COMPARISON:  None. FINDINGS: CTA NECK FINDINGS Aortic arch: Moderate calcified plaque and small amount of soft  plaque projecting into the lumen of the distal arch, incompletely imaged. Normal variant aortic arch branching pattern with common origin of the brachiocephalic and left common carotid arteries. No significant arch vessel origin stenosis. Right carotid system: Patent with mild-to-moderate calcified and soft plaque at the carotid bifurcation. No evidence of a significant stenosis or dissection. Left carotid system: Patent with moderate calcified and soft plaque at the carotid bifurcation resulting in 55% proximal ICA stenosis. Tortuous mid cervical ICA. Vertebral arteries: The right vertebral artery is patent and dominant with calcified and soft plaque at its origin resulting in moderate stenosis. The left  vertebral artery is occluded at its origin with distal reconstitution beginning at the C3-4 level. Skeleton: Focally advanced disc degeneration at C6-7. Other neck: No evidence of cervical lymphadenopathy. 1.3 cm centrally hypoattenuating mass in the left parotid gland with nodular peripheral enhancement. Upper chest: Centrilobular emphysema and mild scarring in the lung apices. Review of the MIP images confirms the above findings CTA HEAD FINDINGS Anterior circulation: The internal carotid arteries are patent from skull base to carotid termini with mild atherosclerotic plaque bilaterally not resulting in a significant stenosis. 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 ACAs and MCAs are patent without evidence of a proximal branch occlusion or significant proximal stenosis. No aneurysm is identified. There is a mass along the right sphenoid wing suspicious for a meningioma as noted on today's earlier noncontrast CT. Posterior circulation: The intracranial vertebral arteries are patent to the basilar with mild stenosis on the right due to atherosclerotic plaque. The right PICA and left AICA appear dominant. Patent SCAs are seen bilaterally. The basilar artery is widely patent. There is a diminutive left posterior communicating artery. The PCAs are patent with mild branch vessel irregularity but no evidence of a significant proximal stenosis. No aneurysm is identified. Venous sinuses: Patent. Anatomic variants: Absent right A1. Review of the MIP images confirms the above findings IMPRESSION: 1. 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 2. 55% proximal left ICA stenosis. 3. Moderate right vertebral artery origin stenosis. 4. Mild intracranial atherosclerosis without significant proximal stenosis or major intracranial arterial occlusion. 5. Suspected meningioma along the right sphenoid wing. 6. 1.3 cm left parotid mass, possibly a  small primary parotid neoplasm. 7. Aortic Atherosclerosis (ICD10-I70.0) and Emphysema (ICD10-J43.9). Electronically Signed   By: Logan Bores M.D.   On: 02/04/2020 15:09    (Echo, Carotid, EGD, Colonoscopy, ERCP)    Subjective: Patient seen and examined.  No overnight events.  Walked around with physical therapy and feels back to his baseline.  He does have some dragging of the right leg but patient stated it is present for long time.   Discharge Exam: Vitals:   02/06/20 0407 02/06/20 0805  BP: (!) 128/59 (!) 120/52  Pulse: 75 76  Resp: 19 18  Temp: 97.6 F (36.4 C) 97.7 F (36.5 C)  SpO2: 98% 100%   Vitals:   02/05/20 2012 02/06/20 0023 02/06/20 0407 02/06/20 0805  BP: 135/76 (!) 141/75 (!) 128/59 (!) 120/52  Pulse: 63 (!) 59 75 76  Resp: 18 18 19 18   Temp: 97.7 F (36.5 C) 97.9 F (36.6 C) 97.6 F (36.4 C) 97.7 F (36.5 C)  TempSrc:  Oral Oral Oral  SpO2: 98% 96% 98% 100%  Weight:      Height:        General: Pt is alert, awake, not in acute distress  Cardiovascular: RRR, S1/S2 +, no rubs, no gallops Respiratory: CTA bilaterally, no wheezing, no rhonchi Abdominal: Soft, NT, ND, bowel sounds + Extremities: no edema, no cyanosis Cranial nerves no deficit. Upper and lower extremity motor and sensory far normal. Gait is spastic on the right lower extremity.    The results of significant diagnostics from this hospitalization (including imaging, microbiology, ancillary and laboratory) are listed below for reference.     Microbiology: Recent Results (from the past 240 hour(s))  Resp Panel by RT-PCR (Flu A&B, Covid) Nasopharyngeal Swab     Status: None   Collection Time: 02/04/20  1:25 PM   Specimen: Nasopharyngeal Swab; Nasopharyngeal(NP) swabs in vial transport medium  Result Value Ref Range Status   SARS Coronavirus 2 by RT PCR NEGATIVE NEGATIVE Final    Comment: (NOTE) SARS-CoV-2 target nucleic acids are NOT DETECTED.  The SARS-CoV-2 RNA is generally  detectable in upper respiratory specimens during the acute phase of infection. The lowest concentration of SARS-CoV-2 viral copies this assay can detect is 138 copies/mL. A negative result does not preclude SARS-Cov-2 infection and should not be used as the sole basis for treatment or other patient management decisions. A negative result may occur with  improper specimen collection/handling, submission of specimen other than nasopharyngeal swab, presence of viral mutation(s) within the areas targeted by this assay, and inadequate number of viral copies(<138 copies/mL). A negative result must be combined with clinical observations, patient history, and epidemiological information. The expected result is Negative.  Fact Sheet for Patients:  EntrepreneurPulse.com.au  Fact Sheet for Healthcare Providers:  IncredibleEmployment.be  This test is no t yet approved or cleared by the [place] FDA and  has been authorized for detection and/or diagnosis of SARS-CoV-2 by FDA under an Emergency Use Authorization (EUA). This EUA will remain  in effect (meaning this test can be used) for the duration of the COVID-19 declaration under Section 564(b)(1) of the Act, 21 U.S.C.section 360bbb-3(b)(1), unless the authorization is terminated  or revoked sooner.       Influenza A by PCR NEGATIVE NEGATIVE Final   Influenza B by PCR NEGATIVE NEGATIVE Final    Comment: (NOTE) The Xpert Xpress SARS-CoV-2/FLU/RSV plus assay is intended as an aid in the diagnosis of influenza from Nasopharyngeal swab specimens and should not be used as a sole basis for treatment. Nasal washings and aspirates are unacceptable for Xpert Xpress SARS-CoV-2/FLU/RSV testing.  Fact Sheet for Patients: EntrepreneurPulse.com.au  Fact Sheet for Healthcare Providers: IncredibleEmployment.be  This test is not yet approved or cleared by the [place] FDA  and has been authorized for detection and/or diagnosis of SARS-CoV-2 by FDA under an Emergency Use Authorization (EUA). This EUA will remain in effect (meaning this test can be used) for the duration of the COVID-19 declaration under Section 564(b)(1) of the Act, 21 U.S.C. section 360bbb-3(b)(1), unless the authorization is terminated or revoked.  Performed at [facility], [address], [place], [place] [address]      Labs: BNP (last 3 results) No results for input(s): BNP in the last 8760 hours. Basic Metabolic Panel: Recent Labs  Lab 02/04/20 1224  NA 136  K 3.8  CL 107  CO2 23  GLUCOSE 122*  BUN 17  CREATININE 0.82  CALCIUM 8.8*   Liver Function Tests: Recent Labs  Lab 02/04/20 1224  AST 16  ALT 10  ALKPHOS 56  BILITOT 0.8  PROT 7.2  ALBUMIN 3.6   No results for input(s): LIPASE, AMYLASE in the last 168  hours. No results for input(s): AMMONIA in the last 168 hours. CBC: Recent Labs  Lab 02/04/20 1224  WBC 8.3  NEUTROABS 5.8  HGB 13.4  HCT 41.3  MCV 96.3  PLT 160   Cardiac Enzymes: No results for input(s): CKTOTAL, CKMB, CKMBINDEX, TROPONINI in the last 168 hours. BNP: Invalid input(s): POCBNP CBG: No results for input(s): GLUCAP in the last 168 hours. D-Dimer No results for input(s): DDIMER in the last 72 hours. Hgb A1c Recent Labs    02/05/20 0755  HGBA1C 5.1   Lipid Profile Recent Labs    02/05/20 0755  CHOL 202*  HDL 29*  LDLCALC 153*  TRIG 99  CHOLHDL 7.0   Thyroid function studies No results for input(s): TSH, T4TOTAL, T3FREE, THYROIDAB in the last 72 hours.  Invalid input(s): FREET3 Anemia work up No results for input(s): VITAMINB12, FOLATE, FERRITIN, TIBC, IRON, RETICCTPCT in the last 72 hours. Urinalysis    Component Value Date/Time   COLORURINE STRAW (A) 02/04/2020 1500   APPEARANCEUR CLEAR 02/04/2020 1500   LABSPEC 1.004 (L) 02/04/2020 1500   PHURINE 6.0 02/04/2020 1500   GLUCOSEU NEGATIVE 02/04/2020 1500   HGBUR  NEGATIVE 02/04/2020 1500   BILIRUBINUR NEGATIVE 02/04/2020 1500   KETONESUR NEGATIVE 02/04/2020 1500   PROTEINUR NEGATIVE 02/04/2020 1500   UROBILINOGEN 1.0 01/10/2012 1728   NITRITE NEGATIVE 02/04/2020 1500   LEUKOCYTESUR NEGATIVE 02/04/2020 1500   Sepsis Labs Invalid input(s): PROCALCITONIN,  WBC,  LACTICIDVEN Microbiology Recent Results (from the past 240 hour(s))  Resp Panel by RT-PCR (Flu A&B, Covid) Nasopharyngeal Swab     Status: None   Collection Time: 02/04/20  1:25 PM   Specimen: Nasopharyngeal Swab; Nasopharyngeal(NP) swabs in vial transport medium  Result Value Ref Range Status   SARS Coronavirus 2 by RT PCR NEGATIVE NEGATIVE Final    Comment: (NOTE) SARS-CoV-2 target nucleic acids are NOT DETECTED.  The SARS-CoV-2 RNA is generally detectable in upper respiratory specimens during the acute phase of infection. The lowest concentration of SARS-CoV-2 viral copies this assay can detect is 138 copies/mL. A negative result does not preclude SARS-Cov-2 infection and should not be used as the sole basis for treatment or other patient management decisions. A negative result may occur with  improper specimen collection/handling, submission of specimen other than nasopharyngeal swab, presence of viral mutation(s) within the areas targeted by this assay, and inadequate number of viral copies(<138 copies/mL). A negative result must be combined with clinical observations, patient history, and epidemiological information. The expected result is Negative.  Fact Sheet for Patients:  EntrepreneurPulse.com.au  Fact Sheet for Healthcare Providers:  IncredibleEmployment.be  This test is no t yet approved or cleared by the [place] FDA and  has been authorized for detection and/or diagnosis of SARS-CoV-2 by FDA under an Emergency Use Authorization (EUA). This EUA will remain  in effect (meaning this test can be used) for the duration of  the COVID-19 declaration under Section 564(b)(1) of the Act, 21 U.S.C.section 360bbb-3(b)(1), unless the authorization is terminated  or revoked sooner.       Influenza A by PCR NEGATIVE NEGATIVE Final   Influenza B by PCR NEGATIVE NEGATIVE Final    Comment: (NOTE) The Xpert Xpress SARS-CoV-2/FLU/RSV plus assay is intended as an aid in the diagnosis of influenza from Nasopharyngeal swab specimens and should not be used as a sole basis for treatment. Nasal washings and aspirates are unacceptable for Xpert Xpress SARS-CoV-2/FLU/RSV testing.  Fact Sheet for Patients: EntrepreneurPulse.com.au  Fact Sheet for Healthcare Providers:  IncredibleEmployment.be  This test is not yet approved or cleared by the [place] and has been authorized for detection and/or diagnosis of SARS-CoV-2 by FDA under an Emergency Use Authorization (EUA). This EUA will remain in effect (meaning this test can be used) for the duration of the COVID-19 declaration under Section 564(b)(1) of the Act, 21 U.S.C. section 360bbb-3(b)(1), unless the authorization is terminated or revoked.  Performed at [facility], [address], [place], [place] [address]      Time coordinating discharge:  40 minutes  SIGNED:   Barb Merino, MD  [facility] 02/06/2020, 10:32 AM

## 2020-02-08 ENCOUNTER — Telehealth: Payer: Self-pay | Admitting: *Deleted

## 2020-02-08 NOTE — Telephone Encounter (Signed)
  Contact Date: 02/08/2020 Contacted By: Baldomero Lamy, LPN  Transition Care Management Follow-up Telephone Call  Date of discharge and from where: 02/04/2020 [facility] Discharge Diagnosis:   Mixed hyperlipidemia   Coronary atherosclerosis of native coronary artery   Tobacco abuse   Right hemiparesis (Darryl Ward)   Cerebral thrombosis with cerebral infarction   Meningioma, cerebral (HCC)   Tumor of parotid gland   Lumbar spinal stenosis  How have you been since you were released from the hospital? good  Any questions or concerns? No   Items Reviewed:  Did the pt receive and understand the discharge instructions provided? Yes   Medications obtained and verified? Yes   Any new allergies since your discharge? No   Dietary orders reviewed? Yes  Do you have support at home? Yes   Discontinued Medications rosuvastatin 10 MG tablet New Medications Added atorvastatin 80 MG tablet  Current Medication List Allergies as of 02/08/2020   No Known Allergies     Medication List       Accurate as of February 08, 2020 12:48 PM. If you have any questions, ask your nurse or doctor.        aspirin EC 81 MG tablet Take 1 tablet (81 mg total) by mouth daily.   atorvastatin 80 MG tablet Commonly known as: LIPITOR Take 1 tablet (80 mg total) by mouth daily.   celecoxib 200 MG capsule Commonly known as: CELEBREX Take 1 capsule (200 mg total) by mouth 2 (two) times daily.   metoprolol succinate 25 MG 24 hr tablet Commonly known as: TOPROL-XL Take 1 tablet (25 mg total) by mouth daily.        Home Care and Equipment/Supplies: Were home health services ordered? not applicable If so, what is the name of the agency? n/a  Has the agency set up a time to come to the patient's home? not applicable Were any new equipment or medical supplies ordered?  Yes: walker with cushion What is the name of the medical supply agency? unknown Were you able to get the supplies/equipment? Not  yet Do you have any questions related to the use of the equipment or supplies? no  Functional Questionnaire: (I = Independent and D = Dependent) ADLs: I  Bathing/Dressing- I  Meal Prep- I  Eating- I  Maintaining continence- I  Transferring/Ambulation- I  Managing Meds- I  Follow up appointments reviewed:   PCP Hospital f/u appt confirmed? Yes  Scheduled to see Jac Canavan, NP on 02/15/2020 @ 11 am.  [facility] f/u appt confirmed? No    Are transportation arrangements needed? No   If their condition worsens, is the pt aware to call PCP or go to the Emergency Dept.? Yes  Was the patient provided with contact information for the PCP's office or ED? Yes  Was to pt encouraged to call back with questions or concerns? Yes

## 2020-02-15 ENCOUNTER — Other Ambulatory Visit: Payer: Self-pay

## 2020-02-15 ENCOUNTER — Ambulatory Visit (INDEPENDENT_AMBULATORY_CARE_PROVIDER_SITE_OTHER): Payer: Medicare Other | Admitting: Nurse Practitioner

## 2020-02-15 ENCOUNTER — Encounter: Payer: Self-pay | Admitting: Nurse Practitioner

## 2020-02-15 ENCOUNTER — Telehealth: Payer: Self-pay

## 2020-02-15 VITALS — BP 165/75 | HR 84 | Temp 97.3°F | Ht 70.0 in | Wt 181.0 lb

## 2020-02-15 DIAGNOSIS — I6330 Cerebral infarction due to thrombosis of unspecified cerebral artery: Secondary | ICD-10-CM | POA: Diagnosis not present

## 2020-02-15 DIAGNOSIS — I6930 Unspecified sequelae of cerebral infarction: Secondary | ICD-10-CM | POA: Diagnosis not present

## 2020-02-15 DIAGNOSIS — Z09 Encounter for follow-up examination after completed treatment for conditions other than malignant neoplasm: Secondary | ICD-10-CM | POA: Insufficient documentation

## 2020-02-15 DIAGNOSIS — D320 Benign neoplasm of cerebral meninges: Secondary | ICD-10-CM | POA: Diagnosis not present

## 2020-02-15 DIAGNOSIS — M48061 Spinal stenosis, lumbar region without neurogenic claudication: Secondary | ICD-10-CM | POA: Diagnosis not present

## 2020-02-15 NOTE — Assessment & Plan Note (Signed)
No signs or symptoms of cerebral thrombosis/cerebral infarction.  Patient is gradually gaining back strength with only slight right-sided unsteady gait.

## 2020-02-15 NOTE — Telephone Encounter (Signed)
See message.  Can patient get his maintenance med prescriptions printed out so he can send to mail order please.  I am at home or I would do this and have you sign them.

## 2020-02-15 NOTE — Patient Instructions (Signed)
Stroke Prevention Some medical conditions and lifestyle choices can lead to a higher risk for a stroke. You can help to prevent a stroke by eating healthy foods and exercising. It also helps to not smoke and to manage any health problems you may have. How can this condition affect me? A stroke is an emergency. It should be treated right away. A stroke can lead to brain damage or threaten your life. There is a better chance of surviving and getting better after a stroke if you get medical help right away. What can increase my risk? The following medical conditions may increase your risk of a stroke:  Diseases of the heart and blood vessels (cardiovascular disease).  High blood pressure (hypertension).  Diabetes.  High cholesterol.  Sickle cell disease.  Problems with blood clotting.  Being very overweight.  Sleeping problems (obstructivesleep apnea). Other risk factors include:  Being older than age 43.  A history of blood clots, stroke, or mini-stroke (TIA).  Race, ethnic background, or a family history of stroke.  Smoking or using tobacco products.  Taking birth control pills, especially if you smoke.  Heavy alcohol and drug use.  Not being active. What actions can I take to prevent this? Manage your health conditions  High cholesterol. ? Eat a healthy diet. If this is not enough to manage your cholesterol, you may need to take medicines. ? Take medicines as told by your doctor.  High blood pressure. ? Try to keep your blood pressure below 130/80. ? If your blood pressure cannot be managed through a healthy diet and regular exercise, you may need to take medicines. ? Take medicines as told by your doctor. ? Ask your doctor if you should check your blood pressure at home. ? Have your blood pressure checked every year.  Diabetes. ? Eat a healthy diet and get regular exercise. If your blood sugar (glucose) cannot be managed through diet and exercise, you may need to  take medicines. ? Take medicines as told by your doctor.  Talk to your doctor about getting checked for sleeping problems. Signs of a problem can include: ? Snoring a lot. ? Feeling very tired.  Make sure that you manage any other conditions you have. Nutrition  Follow instructions from your doctor about what to eat or drink. You may be told to: ? Eat and drink fewer calories each day. ? Limit how much salt (sodium) you use to 1,500 milligrams (mg) each day. ? Use only healthy fats for cooking, such as olive oil, canola oil, and sunflower oil. ? Eat healthy foods. To do this:  Choose foods that are high in fiber. These include whole grains, and fresh fruits and vegetables.  Eat at least 5 servings of fruits and vegetables a day. Try to fill one-half of your plate with fruits and vegetables at each meal.  Choose low-fat (lean) proteins. These include low-fat cuts of meat, chicken without skin, fish, tofu, beans, and nuts.  Eat low-fat dairy products. ? Avoid foods that:  Are high in salt.  Have saturated fat.  Have trans fat.  Have cholesterol.  Are processed or pre-made. ? Count how many carbohydrates you eat and drink each day.   Lifestyle  If you drink alcohol: ? Limit how much you have to:  0-1 drink a day for women who are not pregnant.  0-2 drinks a day for men. ? Know how much alcohol is in your drink. In the [place] one drink equals one 12 oz bottle  of beer (338mL), one 5 oz glass of wine (149mL), or one 1 oz glass of hard liquor (16mL).  Do not smoke or use any products that have nicotine or tobacco. If you need help quitting, ask your doctor.  Avoid secondhand smoke.  Do not use drugs. Activity  Try to stay at a healthy weight.  Get at least 30 minutes of exercise on most days, such as: ? Fast walking. ? Biking. ? Swimming.   Medicines  Take over-the-counter and prescription medicines only as told by your doctor.  Avoid taking birth control pills.  Talk to your doctor about the risks of taking birth control pills if: ? You are over 61 years old. ? You smoke. ? You get very bad headaches. ? You have had a blood clot. Where to find more information  American Stroke Association: www.strokeassociation.org Get help right away if:  You or a loved one has any signs of a stroke. "BE FAST" is an easy way to remember the warning signs: ? B - Balance. Dizziness, sudden trouble walking, or loss of balance. ? E - Eyes. Trouble seeing or a change in how you see. ? F - Face. Sudden weakness or loss of feeling of the face. The face or eyelid may droop on one side. ? A - Arms. Weakness or loss of feeling in an arm. This happens all of a sudden and most often on one side of the body. ? S - Speech. Sudden trouble speaking, slurred speech, or trouble understanding what people say. ? T - Time. Time to call emergency services. Write down what time symptoms started.  You or a loved one has other signs of a stroke, such as: ? A sudden, very bad headache with no known cause. ? Feeling like you may vomit (nausea). ? Vomiting. ? A seizure. These symptoms may be an emergency. Get help right away. Call your local emergency services (911 in the [place]  Do not wait to see if the symptoms will go away.  Do not drive yourself to the hospital. Summary  You can help to prevent a stroke by eating healthy, exercising, and not smoking. It also helps to manage any health problems you have.  Do not smoke or use any products that contain nicotine or tobacco.  Get help right away if you or a loved one has any signs of a stroke. This information is not intended to replace advice given to you by your health care provider. Make sure you discuss any questions you have with your health care provider. Document Revised: 08/09/2019 Document Reviewed: 08/09/2019 Elsevier Patient Education  [place].

## 2020-02-15 NOTE — Telephone Encounter (Signed)
This message is very confusing.  Patient was in for transition of care and had CBC and BMP labs completed.  No medication was ordered.  Forms given to [facility] for [place].

## 2020-02-15 NOTE — Assessment & Plan Note (Addendum)
Hospital discharge follow-up instructions completed.  Medication reconciliation completed patient verbalized understanding.  Follow-up with neurology, ENT scheduled by hospital.  Repeat BMP, CBC completed results pending.

## 2020-02-15 NOTE — Progress Notes (Signed)
Established Patient Office Visit  Subjective:  Patient ID: Darryl Ward, male    DOB: August 05, 1945  Age: 75 y.o. MRN: [id number]  CC:  Chief Complaint  Patient presents with  . Transitions Of Care    HPI Darryl Ward is a 75 year old male who presents to clinic for hospital discharge follow-up.  Patient has a history of coronary artery disease, hyperlipidemia, hypertension, history of stroke with no residual.  Patient was admitted to the hospital when he presented to the emergency department with a right-sided leg weakness and gait instability.  Patient was stable after 24 hours with right leg weakness improved, no deficits no TPA or neurological intervention was done.  Completed CT, MRI and CTA  Patient treated based on imaging results.  Started on Crestor 20 mg tablet daily and aspirin 81 mg daily.  Patient was advised to stop smoking, outpatient referral to neurosurgery for meningioma and spinal stenosis.   Today's visit was for Transitional Care Management.  The patient was discharged from [facility] on 02/06/2020 with a primary diagnosis of mixed hyperlipidemia, coronary arthrosclerosis of native coronary artery.  Contact with the patient and/or caregiver, by a clinical staff member, was made on 02/08/2020 and was documented as a telephone encounter within the EMR.  Through chart review and discussion with the patient I have determined that management of their condition is of moderate complexity.   Hospital discharge instructions completed with medication reconciliation done patient verbalized understanding.    Past Medical History:  Diagnosis Date  . AAA (abdominal aortic aneurysm) ([place])   . Carotid artery disease (HCC)    Nonobstructive  . Cataract   . Coronary atherosclerosis of native coronary artery    PTCA small diagonal 2007 otherwise nonobstructive CAD, EF 50-55%  . Essential hypertension, benign   . Hyperlipidemia   . NSTEMI (non-ST elevated myocardial  infarction) ([place]) 2007  . Stroke [facility]) 2004  . TIA (transient ischemic attack) 2006    Past Surgical History:  Procedure Laterality Date  . AORTA - BILATERAL FEMORAL ARTERY BYPASS GRAFT  01/08/2012   Procedure: AORTA BIFEMORAL BYPASS GRAFT;  Surgeon: Elam Dutch, MD;  Location: [facility] OR;  Service: Vascular;  Laterality: Bilateral;  using 18x31mm x 40cm Hemashield Gold Vascular Graft with Endarterectomy, Thombectomy and  Reimplantation of Inferior Mesenteric Artery  . COLONOSCOPY N/A 04/14/2019   Procedure: COLONOSCOPY;  Surgeon: Daneil Dolin, MD;  [facility];  Service: Endoscopy;  Laterality: N/A;  9:30  . Left cataract surgery    . POLYPECTOMY  04/14/2019   Procedure: POLYPECTOMY;  Surgeon: Daneil Dolin, MD;  [facility]: [facility];  Service: Endoscopy;;    Family History  Problem Relation Age of Onset  . Hyperlipidemia Sister   . Heart attack Brother     Social History   Socioeconomic History  . Marital status: Divorced    Spouse name: Not on file  . Number of children: 1  . Years of education: 48  . Highest education level: 11th grade  Occupational History    Employer: Probation officer  Tobacco Use  . Smoking status: Current Every Day Smoker    Packs/day: 1.00    Years: 40.00    Pack years: 40.00    Types: Cigarettes  . Smokeless tobacco: Never Used  Vaping Use  . Vaping Use: Never used  Substance and Sexual Activity  . Alcohol use: No    Comment: Prior history of regular alcohol use  . Drug use: No  .  Sexual activity: Not Currently  Other Topics Concern  . Not on file  Social History Narrative  . Not on file   Social Determinants of Health   Financial Resource Strain: Low Risk   . Difficulty of Paying Living Expenses: Not hard at all  Food Insecurity: No Food Insecurity  . Worried About Charity fundraiser in the Last Year: Never true  . Ran Out of Food in the Last Year: Never true  Transportation Needs: No Transportation Needs  .  Lack of Transportation (Medical): No  . Lack of Transportation (Non-Medical): No  Physical Activity: Inactive  . Days of Exercise per Week: 0 days  . Minutes of Exercise per Session: 0 min  Stress: No Stress Concern Present  . Feeling of Stress : Only a little  Social Connections: Moderately Isolated  . Frequency of Communication with Friends and Family: More than three times a week  . Frequency of Social Gatherings with Friends and Family: More than three times a week  . Attends Religious Services: 1 to 4 times per year  . Active Member of Clubs or Organizations: No  . Attends Archivist Meetings: Never  . Marital Status: Divorced  Human resources officer Violence: Not on file    Outpatient Medications Prior to Visit  Medication Sig Dispense Refill  . aspirin EC 81 MG tablet Take 1 tablet (81 mg total) by mouth daily. 30 tablet 11  . atorvastatin (LIPITOR) 80 MG tablet Take 1 tablet (80 mg total) by mouth daily. 30 tablet 2  . celecoxib (CELEBREX) 200 MG capsule Take 1 capsule (200 mg total) by mouth 2 (two) times daily. 60 capsule 0  . metoprolol succinate (TOPROL-XL) 25 MG 24 hr tablet Take 1 tablet (25 mg total) by mouth daily. 90 tablet 0   No facility-administered medications prior to visit.    No Known Allergies  ROS Review of Systems  Constitutional: Negative.   HENT: Negative.   Respiratory: Negative.   Gastrointestinal: Negative.   Genitourinary: Negative.   Musculoskeletal: Negative.   Skin: Negative.   Neurological: Positive for weakness. Negative for facial asymmetry, speech difficulty, light-headedness, numbness and headaches.  All other systems reviewed and are negative.     Objective:    Physical Exam Vitals reviewed.  Constitutional:      Appearance: Normal appearance.  HENT:     Head: Normocephalic.     Nose: Nose normal.  Cardiovascular:     Rate and Rhythm: Normal rate.     Pulses: Normal pulses.     Heart sounds: Normal heart sounds.   Pulmonary:     Effort: Pulmonary effort is normal.     Breath sounds: Normal breath sounds.  Abdominal:     General: Bowel sounds are normal.  Musculoskeletal:     Comments: Unsteady gait  Skin:    General: Skin is warm.  Neurological:     Mental Status: He is alert and oriented to person, place, and time.  Psychiatric:        Behavior: Behavior normal.     BP (!) 165/75   Pulse 84   Temp (!) 97.3 F (36.3 C)   Ht 5\' 10"  (1.778 m)   Wt 181 lb (82.1 kg)   SpO2 100%   BMI 25.97 kg/m  Wt Readings from Last 3 Encounters:  02/15/20 181 lb (82.1 kg)  02/04/20 180 lb (81.6 kg)  11/25/19 180 lb (81.6 kg)     Health Maintenance Due  Topic Date Due  .  Hepatitis C Screening  Never done  . TETANUS/TDAP  Never done    There are no preventive care reminders to display for this patient.  No results found for: TSH Lab Results  Component Value Date   WBC 8.3 02/04/2020   HGB 13.4 02/04/2020   HCT 41.3 02/04/2020   MCV 96.3 02/04/2020   PLT 160 02/04/2020   Lab Results  Component Value Date   NA 136 02/04/2020   K 3.8 02/04/2020   CO2 23 02/04/2020   GLUCOSE 122 (H) 02/04/2020   BUN 17 02/04/2020   CREATININE 0.82 02/04/2020   BILITOT 0.8 02/04/2020   ALKPHOS 56 02/04/2020   AST 16 02/04/2020   ALT 10 02/04/2020   PROT 7.2 02/04/2020   ALBUMIN 3.6 02/04/2020   CALCIUM 8.8 (L) 02/04/2020   ANIONGAP 6 02/04/2020   Lab Results  Component Value Date   CHOL 202 (H) 02/05/2020   Lab Results  Component Value Date   HDL 29 (L) 02/05/2020   Lab Results  Component Value Date   LDLCALC 153 (H) 02/05/2020   Lab Results  Component Value Date   TRIG 99 02/05/2020   Lab Results  Component Value Date   CHOLHDL 7.0 02/05/2020   Lab Results  Component Value Date   HGBA1C 5.1 02/05/2020      Assessment & Plan:   Problem List Items Addressed This Visit      Cardiovascular and Mediastinum   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  Patient is gradually gaining back strength with only slight right-sided unsteady gait.      Relevant Orders   Basic Metabolic Panel   CBC with Differential     Other   Hospital discharge follow-up    Hospital discharge follow-up instructions completed.  Medication reconciliation completed patient verbalized understanding.  Follow-up with neurology, ENT scheduled by hospital.  Repeat BMP, CBC completed results pending.            Follow-up: Return if symptoms worsen or fail to improve.    Ivy Lynn, NP

## 2020-02-16 ENCOUNTER — Other Ambulatory Visit: Payer: Self-pay | Admitting: Nurse Practitioner

## 2020-02-16 ENCOUNTER — Telehealth: Payer: Self-pay

## 2020-02-16 DIAGNOSIS — I739 Peripheral vascular disease, unspecified: Secondary | ICD-10-CM

## 2020-02-16 LAB — BASIC METABOLIC PANEL
BUN/Creatinine Ratio: 16 (ref 10–24)
BUN: 16 mg/dL (ref 8–27)
CO2: 21 mmol/L (ref 20–29)
Calcium: 9 mg/dL (ref 8.6–10.2)
Chloride: 106 mmol/L (ref 96–106)
Creatinine, Ser: 0.97 mg/dL (ref 0.76–1.27)
GFR calc Af Amer: 89 mL/min/{1.73_m2} (ref >59)
GFR calc non Af Amer: 77 mL/min/{1.73_m2} (ref >59)
Glucose: 88 mg/dL (ref 65–99)
Potassium: 4.2 mmol/L (ref 3.5–5.2)
Sodium: 142 mmol/L (ref 134–144)

## 2020-02-16 LAB — CBC WITH DIFFERENTIAL/PLATELET
Basophils Absolute: 0.1 10*3/uL (ref 0.0–0.2)
Basos: 1 %
EOS (ABSOLUTE): 0.1 10*3/uL (ref 0.0–0.4)
Eos: 2 %
Hematocrit: 40.6 % (ref 37.5–51.0)
Hemoglobin: 14 g/dL (ref 13.0–17.7)
Immature Grans (Abs): 0 10*3/uL (ref 0.0–0.1)
Immature Granulocytes: 0 %
Lymphocytes Absolute: 1.7 10*3/uL (ref 0.7–3.1)
Lymphs: 19 %
MCH: 31.7 pg (ref 26.6–33.0)
MCHC: 34.5 g/dL (ref 31.5–35.7)
MCV: 92 fL (ref 79–97)
Monocytes Absolute: 0.6 10*3/uL (ref 0.1–0.9)
Monocytes: 7 %
Neutrophils Absolute: 6.7 10*3/uL (ref 1.4–7.0)
Neutrophils: 71 %
Platelets: 167 10*3/uL (ref 150–450)
RBC: 4.42 x10E6/uL (ref 4.14–5.80)
RDW: 13.2 % (ref 11.6–15.4)
WBC: 9.3 10*3/uL (ref 3.4–10.8)

## 2020-02-16 MED ORDER — ATORVASTATIN CALCIUM 80 MG PO TABS: 80.0000 mg | ORAL_TABLET | Freq: Every day | ORAL | 3 refills | Status: DC

## 2020-02-16 MED ORDER — ASPIRIN EC 81 MG PO TBEC: 81.0000 mg | DELAYED_RELEASE_TABLET | Freq: Every day | ORAL | 3 refills | Status: DC

## 2020-02-16 MED ORDER — CELECOXIB 200 MG PO CAPS: 200.0000 mg | ORAL_CAPSULE | Freq: Two times a day (BID) | ORAL | 3 refills | Status: DC

## 2020-02-16 MED ORDER — METOPROLOL SUCCINATE ER 25 MG PO TB24: 25.0000 mg | ORAL_TABLET | Freq: Every day | ORAL | 3 refills | Status: DC

## 2020-02-16 NOTE — Telephone Encounter (Signed)
Hard copy of medication prescription in front office for pick up.

## 2020-02-16 NOTE — Telephone Encounter (Signed)
celecoxib (CELEBREX) 200 MG capsule rx written wrong. Pt should take 2 tablets a day qty 180. Please fix and call back when ready to pick up.

## 2020-02-16 NOTE — Telephone Encounter (Signed)
I refilled original script,

## 2020-02-16 NOTE — Telephone Encounter (Signed)
Patient aware.

## 2020-02-17 ENCOUNTER — Telehealth: Payer: Self-pay

## 2020-02-17 ENCOUNTER — Other Ambulatory Visit: Payer: Self-pay | Admitting: *Deleted

## 2020-02-17 NOTE — Telephone Encounter (Signed)
If patient needs a letter to be out for the days I saw him at clinic or until the date of his neuro appointment, I can gladly have that in the front office to be picked up. Or he can wait for his FMLA papers, or call the [place] social worker who worked on His discharge paper. I don't have enough information to do this, I saw Him only once for transition of care.

## 2020-02-17 NOTE — Telephone Encounter (Signed)
Please help address patients need for 3 months latter, I cant justify this needs, I saw pt for [facility] and started his FMLA forms by given it to  [place], and I sent all his refill for 90 days to pharmacy.

## 2020-02-17 NOTE — Telephone Encounter (Signed)
Erroneous encounter

## 2020-02-17 NOTE — Telephone Encounter (Signed)
I am not sure what he needs to be out for. I usually will keep them out until they can see specialist and specialist can take over.

## 2020-02-18 ENCOUNTER — Telehealth: Payer: Self-pay | Admitting: Nurse Practitioner

## 2020-02-18 MED ORDER — CELECOXIB 200 MG PO CAPS: 200.0000 mg | ORAL_CAPSULE | Freq: Two times a day (BID) | ORAL | 3 refills | Status: DC

## 2020-02-18 NOTE — Telephone Encounter (Signed)
Qty on Celebrex corrected & signed by Ardeen Fillers. Note ok'd & written, FMLA ppw in process

## 2020-02-21 DIAGNOSIS — M5416 Radiculopathy, lumbar region: Secondary | ICD-10-CM | POA: Diagnosis not present

## 2020-02-21 DIAGNOSIS — D329 Benign neoplasm of meninges, unspecified: Secondary | ICD-10-CM | POA: Diagnosis not present

## 2020-02-21 DIAGNOSIS — R030 Elevated blood-pressure reading, without diagnosis of hypertension: Secondary | ICD-10-CM | POA: Diagnosis not present

## 2020-02-25 ENCOUNTER — Other Ambulatory Visit: Payer: Self-pay

## 2020-02-25 ENCOUNTER — Ambulatory Visit (INDEPENDENT_AMBULATORY_CARE_PROVIDER_SITE_OTHER): Payer: Medicare Other | Admitting: Otolaryngology

## 2020-02-25 ENCOUNTER — Encounter (INDEPENDENT_AMBULATORY_CARE_PROVIDER_SITE_OTHER): Payer: Self-pay | Admitting: Otolaryngology

## 2020-02-25 VITALS — Temp 97.5°F

## 2020-02-25 DIAGNOSIS — K118 Other diseases of salivary glands: Secondary | ICD-10-CM

## 2020-02-25 NOTE — Progress Notes (Signed)
HPI: Darryl Ward is a 75 y.o. male who presents is referred by ED and hospitalist for evaluation of left parotid mass.  Patient presented to the hospital with dizziness and questionable stroke.  He underwent a CTA of his head and neck.  And on review of this patient was noted to have a deep left parotid mass measuring approximately 1.2 cm in size.  While in the hospital he underwent ultrasound which confirmed a 1.2 cm deep left parotid mass.  He is otherwise asymptomatic concerning this.  Past Medical History:  Diagnosis Date  . AAA (abdominal aortic aneurysm) ([place])   . Carotid artery disease (HCC)    Nonobstructive  . Cataract   . Coronary atherosclerosis of native coronary artery    PTCA small diagonal 2007 otherwise nonobstructive CAD, EF 50-55%  . Essential hypertension, benign   . Hyperlipidemia   . NSTEMI (non-ST elevated myocardial infarction) ([place]) 2007  . Stroke [facility]) 2004  . TIA (transient ischemic attack) 2006   Past Surgical History:  Procedure Laterality Date  . AORTA - BILATERAL FEMORAL ARTERY BYPASS GRAFT  01/08/2012   Procedure: AORTA BIFEMORAL BYPASS GRAFT;  Surgeon: Elam Dutch, MD;  Location: [facility] OR;  Service: Vascular;  Laterality: Bilateral;  using 18x40mm x 40cm Hemashield Gold Vascular Graft with Endarterectomy, Thombectomy and  Reimplantation of Inferior Mesenteric Artery  . COLONOSCOPY N/A 04/14/2019   Procedure: COLONOSCOPY;  Surgeon: Daneil Dolin, MD;  [facility];  Service: Endoscopy;  Laterality: N/A;  9:30  . Left cataract surgery    . POLYPECTOMY  04/14/2019   Procedure: POLYPECTOMY;  Surgeon: Daneil Dolin, MD;  [facility];  Service: Endoscopy;;   Social History   Socioeconomic History  . Marital status: Divorced    Spouse name: Not on file  . Number of children: 1  . Years of education: 8  . Highest education level: 11th grade  Occupational History    Employer: Probation officer  Tobacco Use  . Smoking status:  Current Every Day Smoker    Packs/day: 1.00    Years: 40.00    Pack years: 40.00    Types: Cigarettes  . Smokeless tobacco: Never Used  Vaping Use  . Vaping Use: Never used  Substance and Sexual Activity  . Alcohol use: No    Comment: Prior history of regular alcohol use  . Drug use: No  . Sexual activity: Not Currently  Other Topics Concern  . Not on file  Social History Narrative  . Not on file   Social Determinants of Health   Financial Resource Strain: Low Risk   . Difficulty of Paying Living Expenses: Not hard at all  Food Insecurity: No Food Insecurity  . Worried About Charity fundraiser in the Last Year: Never true  . Ran Out of Food in the Last Year: Never true  Transportation Needs: No Transportation Needs  . Lack of Transportation (Medical): No  . Lack of Transportation (Non-Medical): No  Physical Activity: Inactive  . Days of Exercise per Week: 0 days  . Minutes of Exercise per Session: 0 min  Stress: No Stress Concern Present  . Feeling of Stress : Only a little  Social Connections: Moderately Isolated  . Frequency of Communication with Friends and Family: More than three times a week  . Frequency of Social Gatherings with Friends and Family: More than three times a week  . Attends Religious Services: 1 to 4 times per year  . Active  Member of Clubs or Organizations: No  . Attends Archivist Meetings: Never  . Marital Status: Divorced   Family History  Problem Relation Age of Onset  . Hyperlipidemia Sister   . Heart attack Brother    No Known Allergies Prior to Admission medications   Medication Sig Start Date End Date Taking? Authorizing Provider  aspirin EC 81 MG tablet Take 1 tablet (81 mg total) by mouth daily. 02/16/20   Ivy Lynn, NP  atorvastatin (LIPITOR) 80 MG tablet Take 1 tablet (80 mg total) by mouth daily. 02/16/20 05/16/20  Ivy Lynn, NP  celecoxib (CELEBREX) 200 MG capsule Take 1 capsule (200 mg total) by mouth 2 (two)  times daily. 02/18/20   Ivy Lynn, NP  metoprolol succinate (TOPROL-XL) 25 MG 24 hr tablet Take 1 tablet (25 mg total) by mouth daily. 02/16/20   Ivy Lynn, NP     Positive ROS: Otherwise negative  All other systems have been reviewed and were otherwise negative with the exception of those mentioned in the HPI and as above.  Physical Exam: Constitutional: Alert, well-appearing, no acute distress Ears: External ears without lesions or tenderness. Ear canals are clear bilaterally.  Patient wears bilateral hearing aids.  TMs are clear. Nasal: External nose without lesions.. Clear nasal passages Oral: Lips and gums without lesions. Tongue and palate mucosa without lesions. Posterior oropharynx clear. Neck: No palpable adenopathy or masses.  On palpation of the left parotid area there is no palpable mass that I can appreciate.  He has no adenopathy around the parotid gland or in the upper neck. Cranial nerve function: Patient has normal facial nerve function on both sides. Respiratory: Breathing comfortably  Skin: No facial/neck lesions or rash noted.  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  Plan: Discussed with the patient as well as his wife that most of these parotid masses are benign.  We will plan on scheduling an ultrasound-guided fine-needle aspirate of the mass to see if this is benign or not. Only blood thinner patient is presently taking his aspirin. He is scheduled to see vestibular rehab concerning helping with his recent balance problems. Patient will call us 4 to 5 days following needle biopsy of the parotid mass.   Radene Journey, MD   CC:

## 2020-02-28 ENCOUNTER — Encounter: Payer: Self-pay | Admitting: Physical Therapy

## 2020-02-28 ENCOUNTER — Other Ambulatory Visit (INDEPENDENT_AMBULATORY_CARE_PROVIDER_SITE_OTHER): Payer: Self-pay

## 2020-02-28 ENCOUNTER — Encounter (HOSPITAL_COMMUNITY): Payer: Self-pay | Admitting: Radiology

## 2020-02-28 ENCOUNTER — Other Ambulatory Visit: Payer: Self-pay

## 2020-02-28 ENCOUNTER — Ambulatory Visit: Payer: Medicare Other | Attending: Neurosurgery | Admitting: Physical Therapy

## 2020-02-28 VITALS — BP 122/65

## 2020-02-28 DIAGNOSIS — M5442 Lumbago with sciatica, left side: Secondary | ICD-10-CM | POA: Insufficient documentation

## 2020-02-28 DIAGNOSIS — M5441 Lumbago with sciatica, right side: Secondary | ICD-10-CM | POA: Diagnosis not present

## 2020-02-28 DIAGNOSIS — K118 Other diseases of salivary glands: Secondary | ICD-10-CM

## 2020-02-28 DIAGNOSIS — R293 Abnormal posture: Secondary | ICD-10-CM | POA: Insufficient documentation

## 2020-02-28 NOTE — Progress Notes (Signed)
[name]. Rymer Male, 75 y.o., [birth year]  MRN:  [id number] Phone:  [phone] (M)       PCP:  [name], FNP Coverage:  [facility] Medicare/[employer] Medicare  Next Appt With Rehabilitation 02/28/2020 at 1:45 PM           RE: [name] FNA SALIVARY GLAND/PAROTID GLAND Received: Today Suttle, Rosanne Ashing, MD  Garth Bigness D  Approved for US guided FNA with possible core bx of left parotid mass.   D[place]        Previous Messages   ----- Message -----  From: Garth Bigness D  Sent: 02/28/2020 11:10 AM EST  To: Ir Procedure Requests  Subject: [name] FNA SALIVARY GLAND/PAROTID GLAND        Procedure: [name] FNA SALIVARY GLAND/PAROTID GLAND   Reason: Mass of left parotid gland   History: CT, MR, [name] in computer   Provider: Rozetta Nunnery   Provider Contact: [phone]

## 2020-02-28 NOTE — Therapy (Signed)
[facility] [place], [place], [address] Phone: [phone]   Fax:  [phone]  Physical Therapy Evaluation  Patient Details  Name: Darryl Ward MRN: [id number] Date of Birth: Jan 09, 1946 Referring Provider (PT): Duffy Rhody MD   Encounter Date: 02/28/2020   PT End of Session - 02/28/20 1455    Visit Number 1    Number of Visits 12    Date for PT Re-Evaluation 05/28/20    Authorization Type FOTO AT LEAST EVERY 5TH VISIT.  PROGRESS NOTE AT 10TH VISIT.  KX MODIFIER AFTER 15 VISITS.    PT Start Time 0144    PT Stop Time 0225    PT Time Calculation (min) 41 min    Activity Tolerance Patient tolerated treatment well    Behavior During Therapy WFL for tasks assessed/performed           Past Medical History:  Diagnosis Date  . AAA (abdominal aortic aneurysm) ([place])   . Carotid artery disease (HCC)    Nonobstructive  . Cataract   . Coronary atherosclerosis of native coronary artery    PTCA small diagonal 2007 otherwise nonobstructive CAD, EF 50-55%  . Essential hypertension, benign   . Hyperlipidemia   . NSTEMI (non-ST elevated myocardial infarction) ([place]) 2007  . Stroke [facility]) 2004  . TIA (transient ischemic attack) 2006    Past Surgical History:  Procedure Laterality Date  . AORTA - BILATERAL FEMORAL ARTERY BYPASS GRAFT  01/08/2012   Procedure: AORTA BIFEMORAL BYPASS GRAFT;  Surgeon: Elam Dutch, MD;  Location: [facility] OR;  Service: Vascular;  Laterality: Bilateral;  using 18x46mm x 40cm Hemashield Gold Vascular Graft with Endarterectomy, Thombectomy and  Reimplantation of Inferior Mesenteric Artery  . COLONOSCOPY N/A 04/14/2019   Procedure: COLONOSCOPY;  Surgeon: Daneil Dolin, MD;  [facility];  Service: Endoscopy;  Laterality: N/A;  9:30  . Left cataract surgery    . POLYPECTOMY  04/14/2019   Procedure: POLYPECTOMY;  Surgeon: Daneil Dolin, MD;  [facility];  Service: Endoscopy;;     Vitals:   02/28/20 1405  BP: 122/65      Subjective Assessment - 02/28/20 1416    Subjective COVID-19 screen performed prior to patient entering clinic.  The patient reports having suffered a stroke on 02/04/20 and states his back pain seemes to have coincided with this.  Today, he reports back pain rated at a 7/10 bilaterally with pain that will go into his left buttock region and pain and tingling into his right LE.  Lying down helps decrease his pain and overactivity increases his pain.  It was recommended today that he use a cane.  he did not have one with him today but he states he does use one.    Pertinent History AAA, CAD, TIA, Stroke, Aorta--bilateral femoral artery bypass graft.    Limitations Standing    How long can you stand comfortably? Short time.    How long can you walk comfortably? Short community distances.    Patient Stated Goals Decrease pain.    Currently in Pain? Yes    Pain Score 7     Pain Location Back    Pain Orientation Right;Left    Pain Descriptors / Indicators Sharp    Pain Type Acute pain    Pain Onset 1 to 4 weeks ago    Pain Frequency Constant    Aggravating Factors  See above.    Pain Relieving Factors See above.  [facility] PT Assessment - 02/28/20 0001      Assessment   Medical Diagnosis Lumbar back pain with radiculopathy affecting right lower extremity.    Referring Provider (PT) Duffy Rhody MD    Onset Date/Surgical Date 02/04/20      Precautions   Precautions Fall    Precaution Comments Recommend cane usage at all times.  Supervised gait.      Restrictions   Weight Bearing Restrictions No      Balance Screen   Has the patient fallen in the past 6 months No    Has the patient had a decrease in activity level because of a fear of falling?  Yes    Is the patient reluctant to leave their home because of a fear of falling?  No      Home Environment   Living Environment Private residence      Prior Function   Level  of Independence Independent      Observation/Other Assessments   Focus on Therapeutic Outcomes (FOTO)  Complete.      Posture/Postural Control   Posture/Postural Control Postural limitations    Postural Limitations Rounded Shoulders;Forward head;Decreased lumbar lordosis;Flexed trunk      Deep Tendon Reflexes   DTR Assessment Site Patella;Achilles    Patella DTR 2+    Achilles DTR 0      ROM / Strength   AROM / PROM / Strength AROM;Strength      AROM   Overall AROM Comments Active lumbar flexion is limited by 75% and extension is limited to the neutral position.      Strength   Overall Strength Comments Right hip abduction and flexion is 4-/5.  Right knee and ankle is essentially normal.      Palpation   Palpation comment Tender to palpation over left SIJ region with patient also c/o pain bilaterally across his lower lumbar region.  He also reports pain into left buttock and tingling down the length of his right LE.      Special Tests   Other special tests Equal leg lengths.  Positive SLR testing bilaterally.  (-) FABER testing. (-) Romberg test.      Transfers   Comments Sit to stand with supervision with use of armrests.  Sip to supine to sit with supervision.      Ambulation/Gait   Gait Comments Mild gait axtaxia.  Patient not use cane today.  Highly recommended he use it at all times.                      Objective measurements completed on examination: See above findings.       [facility] Adult PT Treatment/Exercise - 02/28/20 0001      Modalities   Modalities Electrical Stimulation;Moist Heat      Moist Heat Therapy   Number Minutes Moist Heat 15 Minutes    Moist Heat Location Lumbar Spine      Electrical Stimulation   Electrical Stimulation Location Bialteral low back.    Electrical Stimulation Action IFC at 80-150 Hz x 15 minutes.    Electrical Stimulation Parameters 40% scan    Electrical Stimulation Goals Pain                                Plan - 02/28/20 1457    Clinical Impression Statement The patient presents to OPPT with c/o low back pain with radiaition into his left buttock  and tingling down the length of his right LE.  His posture is remarkable for a decrease in lumbar lordosis.  His active lumbar range of motion is very limited.  He demonstrates positive SLR's bilaterally.  His gait is slightly ataxic and it is highly recommended he use his cane at all times.  His functional mobility and performance of ADL's is impaired due to pain.Patient will benefit from skilled physical therapy intervention to address pain and deficits.    Personal Factors and Comorbidities Comorbidity 1;Comorbidity 2;Other    Comorbidities AAA, CAD, TIA, Stroke, Aorta--bilateral femoral artery bypass graft.    Examination-Activity Limitations Other    Examination-Participation Restrictions Other    Stability/Clinical Decision Making Evolving/Moderate complexity    Clinical Decision Making Low    Rehab Potential Good    PT Frequency 2x / week    PT Duration 6 weeks    PT Treatment/Interventions ADLs/Self Care Home Management;Cryotherapy;Electrical Stimulation;Ultrasound;Moist Heat;Gait training;Functional mobility training;Therapeutic activities;Therapeutic exercise;Manual techniques;Patient/family education;Passive range of motion    PT Next Visit Plan Core exercise progression.  Modalities and STW/M as needed.    Consulted and Agree with Plan of Care Patient           Patient will benefit from skilled therapeutic intervention in order to improve the following deficits and impairments:  Pain,Decreased activity tolerance,Decreased strength,Decreased range of motion,Abnormal gait  Visit Diagnosis: Acute bilateral low back pain with bilateral sciatica - Plan: PT plan of care cert/re-cert  Abnormal posture - Plan: PT plan of care cert/re-cert     Problem List Patient Active Problem List   Diagnosis  Date Noted  . Hospital discharge follow-up 02/15/2020  . Lumbar spinal stenosis 02/06/2020  . Cerebral thrombosis with cerebral infarction 02/05/2020  . Meningioma, cerebral ([place]) 02/05/2020  . 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 Right hemiparesis ([place]) 02/04/2020  . Chronic midline low back pain without sciatica 04/27/2019  . Tobacco abuse 11/17/2018  . PVD (peripheral vascular disease) ([place]) 05/21/2012  . Atherosclerosis of native arteries of the extremities with ulceration ([employer]) 01/23/2012  . Mixed hyperlipidemia 12/13/2011  . Coronary atherosclerosis of native coronary artery 12/13/2011  . Aneurysm of abdominal vessel ([place]) 12/12/2011    Zaniel Marineau, [place] MPT 02/28/2020, 3:21 PM  [facility] [address] [place], [place], [address] Phone: [phone]   Fax:  [phone]  Name: KAHLEB MCCLANE MRN: [id number] Date of Birth: 1945/08/30

## 2020-03-02 ENCOUNTER — Ambulatory Visit: Payer: Medicare Other | Admitting: Physical Therapy

## 2020-03-06 ENCOUNTER — Other Ambulatory Visit: Payer: Self-pay | Admitting: Student

## 2020-03-06 DIAGNOSIS — M5416 Radiculopathy, lumbar region: Secondary | ICD-10-CM | POA: Diagnosis not present

## 2020-03-07 ENCOUNTER — Ambulatory Visit (HOSPITAL_COMMUNITY)
Admission: RE | Admit: 2020-03-07 | Discharge: 2020-03-07 | Disposition: A | Discharge status: Home / Self Care | Payer: Medicare Other | Source: Ambulatory Visit | Attending: Otolaryngology | Admitting: Otolaryngology

## 2020-03-07 ENCOUNTER — Encounter (HOSPITAL_COMMUNITY): Payer: Self-pay

## 2020-03-07 ENCOUNTER — Other Ambulatory Visit: Payer: Self-pay

## 2020-03-07 DIAGNOSIS — F17210 Nicotine dependence, cigarettes, uncomplicated: Secondary | ICD-10-CM | POA: Diagnosis not present

## 2020-03-07 DIAGNOSIS — K118 Other diseases of salivary glands: Secondary | ICD-10-CM | POA: Insufficient documentation

## 2020-03-07 MED ORDER — FENTANYL CITRATE (PF) 100 MCG/2ML IJ SOLN
INTRAMUSCULAR | Status: AC | PRN
  Administered 2020-03-07 (×2): 50 ug via INTRAVENOUS

## 2020-03-07 MED ORDER — SODIUM CHLORIDE 0.9 % IV SOLN: INTRAVENOUS | Status: DC

## 2020-03-07 MED ORDER — FENTANYL CITRATE (PF) 100 MCG/2ML IJ SOLN
INTRAMUSCULAR | Status: AC
  Filled 2020-03-07: qty 4

## 2020-03-07 MED ORDER — MIDAZOLAM HCL 2 MG/2ML IJ SOLN
INTRAMUSCULAR | Status: AC
  Filled 2020-03-07: qty 4

## 2020-03-07 MED ORDER — MIDAZOLAM HCL 2 MG/2ML IJ SOLN
INTRAMUSCULAR | Status: AC | PRN
  Administered 2020-03-07 (×2): 1 mg via INTRAVENOUS

## 2020-03-07 MED ORDER — LIDOCAINE HCL (PF) 1 % IJ SOLN
INTRAMUSCULAR | Status: AC
  Filled 2020-03-07: qty 30

## 2020-03-07 NOTE — Procedures (Signed)
Interventional Radiology Procedure Note  Procedure: US guided biopsy left parotid nodule  Complications: None  Estimated Blood Loss: None  Recommendations: - DC home   Signed,  Criselda Peaches, MD

## 2020-03-07 NOTE — Discharge Instructions (Signed)

## 2020-03-07 NOTE — Sedation Documentation (Signed)
Pt tolerated procedure well. Band aid/Ice pack applied to LEFT neck puncture site, CDI.

## 2020-03-07 NOTE — H&P (Signed)
Chief Complaint: Patient was seen in consultation today for left parotid mass biopsy at the request of Newman,Christopher E  Referring Physician(s): Newman,Christopher E  Supervising Physician: Jacqulynn Cadet  Patient Status: [facility] - Out-pt  History of Present Illness: Darryl Ward is a 75 y.o. male   Pt was seen in ED 02/04/20 for dizziness; rt side weakness Work up ensued for possible stroke Imaging revealing cerebral arterial stenosis Still in follow up and diagnostics with Neurology +smoker Incidental finding of left parotid mass on CTA NT;  no enlargement-- unknown to pt   CT 02/04/20:  IMPRESSION: 1. 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 2. 55% proximal left ICA stenosis. 3. Moderate right vertebral artery origin stenosis. 4. Mild intracranial atherosclerosis without significant proximal stenosis or major intracranial arterial occlusion. 5. Suspected meningioma along the right sphenoid wing. 6. 1.3 cm left parotid mass, possibly a small primary parotid Neoplasm.  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 Nonemergent ENT consultation could be performed as indicated.  Referred to Dr Gwenlyn Saran 2/4 note: Plan: Discussed with the patient as well as his wife that most of these parotid masses are benign.  We will plan on scheduling an ultrasound-guided fine-needle aspirate of the mass to see if this is benign or not. Only blood thinner patient is presently taking his aspirin. He is scheduled to see vestibular rehab concerning helping with his recent balance problems. Patient will call us 4 to 5 days following needle biopsy of the parotid mass.  Now scheduled for biopsy of parotid mass     Past Medical History:  Diagnosis Date  . AAA  (abdominal aortic aneurysm) ([place])   . Carotid artery disease (HCC)    Nonobstructive  . Cataract   . Coronary atherosclerosis of native coronary artery    PTCA small diagonal 2007 otherwise nonobstructive CAD, EF 50-55%  . Essential hypertension, benign   . Hyperlipidemia   . NSTEMI (non-ST elevated myocardial infarction) ([place]) 2007  . Stroke [facility]) 2004  . TIA (transient ischemic attack) 2006    Past Surgical History:  Procedure Laterality Date  . AORTA - BILATERAL FEMORAL ARTERY BYPASS GRAFT  01/08/2012   Procedure: AORTA BIFEMORAL BYPASS GRAFT;  Surgeon: Elam Dutch, MD;  Location: [facility] OR;  Service: Vascular;  Laterality: Bilateral;  using 18x12mm x 40cm Hemashield Gold Vascular Graft with Endarterectomy, Thombectomy and  Reimplantation of Inferior Mesenteric Artery  . COLONOSCOPY N/A 04/14/2019   Procedure: COLONOSCOPY;  Surgeon: Daneil Dolin, MD;  [facility];  Service: Endoscopy;  Laterality: N/A;  9:30  . Left cataract surgery    . POLYPECTOMY  04/14/2019   Procedure: POLYPECTOMY;  Surgeon: Daneil Dolin, MD;  [facility];  Service: Endoscopy;;    Allergies: Patient has no known allergies.  Medications: Prior to Admission medications   Medication Sig Start Date End Date Taking? Authorizing Provider  aspirin EC 81 MG tablet Take 1 tablet (81 mg total) by mouth daily. 02/16/20  Yes Ivy Lynn, NP  atorvastatin (LIPITOR) 80 MG tablet Take 1 tablet (80 mg total) by mouth daily. 02/16/20 05/16/20 Yes Ivy Lynn, NP  celecoxib (CELEBREX) 200 MG capsule Take 1 capsule (200 mg total) by mouth 2 (two) times daily. 02/18/20  Yes Ivy Lynn, NP  metoprolol succinate (TOPROL-XL) 25 MG 24  hr tablet Take 1 tablet (25 mg total) by mouth daily. 02/16/20  Yes Ivy Lynn, NP     Family History  Problem Relation Age of Onset  . Hyperlipidemia Sister   . Heart attack Brother     Social History   Socioeconomic History  . Marital status:  Divorced    Spouse name: Not on file  . Number of children: 1  . Years of education: 66  . Highest education level: 11th grade  Occupational History    Employer: Probation officer  Tobacco Use  . Smoking status: Current Every Day Smoker    Packs/day: 1.00    Years: 40.00    Pack years: 40.00    Types: Cigarettes  . Smokeless tobacco: Never Used  Vaping Use  . Vaping Use: Never used  Substance and Sexual Activity  . Alcohol use: No    Comment: Prior history of regular alcohol use  . Drug use: No  . Sexual activity: Not Currently  Other Topics Concern  . Not on file  Social History Narrative  . Not on file   Social Determinants of Health   Financial Resource Strain: Low Risk   . Difficulty of Paying Living Expenses: Not hard at all  Food Insecurity: No Food Insecurity  . Worried About Charity fundraiser in the Last Year: Never true  . Ran Out of Food in the Last Year: Never true  Transportation Needs: No Transportation Needs  . Lack of Transportation (Medical): No  . Lack of Transportation (Non-Medical): No  Physical Activity: Inactive  . Days of Exercise per Week: 0 days  . Minutes of Exercise per Session: 0 min  Stress: No Stress Concern Present  . Feeling of Stress : Only a little  Social Connections: Moderately Isolated  . Frequency of Communication with Friends and Family: More than three times a week  . Frequency of Social Gatherings with Friends and Family: More than three times a week  . Attends Religious Services: 1 to 4 times per year  . Active Member of Clubs or Organizations: No  . Attends Archivist Meetings: Never  . Marital Status: Divorced     Review of Systems: A 12 point ROS discussed and pertinent positives are indicated in the HPI above.  All other systems are negative.  Review of Systems  Constitutional: Negative for activity change, fatigue and fever.  HENT: Negative for sore throat and trouble swallowing.   Respiratory: Negative  for cough and shortness of breath.   Cardiovascular: Negative for chest pain.  Gastrointestinal: Negative for abdominal pain.  Musculoskeletal: Negative for back pain and gait problem.  Psychiatric/Behavioral: Negative for behavioral problems and confusion.    Vital Signs: BP (!) 160/77 (BP Location: Right Arm)   Pulse 66   Temp (!) 97.4 F (36.3 C) (Oral)   Resp 19   Ht 5\' 10"  (1.778 m)   Wt 180 lb (81.6 kg)   SpO2 99%   BMI 25.83 kg/m   Physical Exam Vitals reviewed.  HENT:     Mouth/Throat:     Mouth: Mucous membranes are moist.  Cardiovascular:     Rate and Rhythm: Normal rate and regular rhythm.     Heart sounds: Normal heart sounds.  Pulmonary:     Effort: Pulmonary effort is normal.     Breath sounds: Normal breath sounds.  Abdominal:     Palpations: Abdomen is soft.  Musculoskeletal:        General: Normal range  of motion.  Skin:    General: Skin is warm.  Neurological:     Mental Status: He is alert and oriented to person, place, and time.  Psychiatric:        Behavior: Behavior normal.     Imaging: No results found.  Labs:  CBC: Recent Labs    11/25/19 1123 02/04/20 1224 02/15/20 1145  WBC 11.3* 8.3 9.3  HGB 14.0 13.4 14.0  HCT 43.6 41.3 40.6  PLT 176 160 167    COAGS: Recent Labs    02/04/20 1224  INR 1.0  APTT 30    BMP: Recent Labs    11/25/19 1123 02/04/20 1224 02/15/20 1145  NA 141 136 142  K 4.8 3.8 4.2  CL 106 107 106  CO2 21 23 21   GLUCOSE 77 122* 88  BUN 13 17 16   CALCIUM 9.3 8.8* 9.0  CREATININE 0.94 0.82 0.97  GFRNONAA 81 >60 77  GFRAA 93  --  89    LIVER FUNCTION TESTS: Recent Labs    11/25/19 1123 02/04/20 1224  BILITOT 0.4 0.8  AST 15 16  ALT 8 10  ALKPHOS 72 56  PROT 6.6 7.2  ALBUMIN 4.0 3.6    TUMOR MARKERS: No results for input(s): AFPTM, CEA, CA199, CHROMGRNA in the last 8760 hours.  Assessment and Plan:  Left parotid mass  Found incidentally while in evaluation for dizziness and  weakness for possible stroke +smoker Scheduled for left parotid mass biopsy Risks and benefits of left parotid mass bx was discussed with the patient and/or patient's family including, but not limited to bleeding, infection, damage to adjacent structures or low yield requiring additional tests.  All of the questions were answered and there is agreement to proceed. Consent signed and in chart.  Thank you for this interesting consult.  I greatly enjoyed meeting RASHID WHITENIGHT and look forward to participating in their care.  A copy of this report was sent to the requesting provider on this date.  Electronically Signed: Lavonia Drafts, PA-C 03/07/2020, 11:46 AM   I spent a total of  30 Minutes   in face to face in clinical consultation, greater than 50% of which was counseling/coordinating care for left parotid mass bx

## 2020-03-09 ENCOUNTER — Ambulatory Visit: Payer: Medicare Other | Admitting: Physical Therapy

## 2020-03-09 ENCOUNTER — Other Ambulatory Visit: Payer: Self-pay

## 2020-03-09 DIAGNOSIS — R293 Abnormal posture: Secondary | ICD-10-CM | POA: Diagnosis not present

## 2020-03-09 DIAGNOSIS — M5442 Lumbago with sciatica, left side: Secondary | ICD-10-CM

## 2020-03-09 DIAGNOSIS — M5441 Lumbago with sciatica, right side: Secondary | ICD-10-CM

## 2020-03-09 LAB — CYTOLOGY - NON PAP

## 2020-03-09 NOTE — Patient Instructions (Signed)
Pelvic Tilt: Posterior - Legs Bent (Supine)   Tighten stomach and flatten back by rolling pelvis down. Hold _10___ seconds. Relax. Repeat _10-30___ times per set. Do __2__ sets per session. Do _2___ sessions per day.   . Straight Leg Raise   Tighten stomach and slowly raise locked right leg __4__ inches from floor. Repeat __10-30__ times per set. Do __2__ sets per session. Do __2__ sessions per day.   Bent Leg Lift (Hook-Lying)   Tighten stomach and slowly raise right leg _5___ inches from floor. Keep trunk rigid. Hold _3___ seconds. Repeat _10___ times per set. Do ___2-3_ sets per session. Do __2__ sessions per day.

## 2020-03-09 NOTE — Therapy (Signed)
[facility] [place], [place], [address] Phone: [phone]   Fax:  [phone]  Physical Therapy Treatment  Patient Details  Name: Darryl Ward MRN: [id number] Date of Birth: 1945-05-05 Referring Provider (PT): Duffy Rhody MD   Encounter Date: 03/09/2020   PT End of Session - 03/09/20 1351    Visit Number 2    Number of Visits 12    Date for PT Re-Evaluation 05/28/20    Authorization Type FOTO AT LEAST EVERY 5TH VISIT.  PROGRESS NOTE AT 10TH VISIT.  KX MODIFIER AFTER 15 VISITS.    PT Start Time 0145    PT Stop Time 0227    PT Time Calculation (min) 42 min    Activity Tolerance Patient tolerated treatment well    Behavior During Therapy [facility] for tasks assessed/performed           Past Medical History:  Diagnosis Date  . AAA (abdominal aortic aneurysm) ([place])   . Carotid artery disease (HCC)    Nonobstructive  . Cataract   . Coronary atherosclerosis of native coronary artery    PTCA small diagonal 2007 otherwise nonobstructive CAD, EF 50-55%  . Essential hypertension, benign   . Hyperlipidemia   . NSTEMI (non-ST elevated myocardial infarction) ([place]) 2007  . Stroke [facility]) 2004  . TIA (transient ischemic attack) 2006    Past Surgical History:  Procedure Laterality Date  . AORTA - BILATERAL FEMORAL ARTERY BYPASS GRAFT  01/08/2012   Procedure: AORTA BIFEMORAL BYPASS GRAFT;  Surgeon: Elam Dutch, MD;  Location: [facility] OR;  Service: Vascular;  Laterality: Bilateral;  using 18x37mm x 40cm Hemashield Gold Vascular Graft with Endarterectomy, Thombectomy and  Reimplantation of Inferior Mesenteric Artery  . COLONOSCOPY N/A 04/14/2019   Procedure: COLONOSCOPY;  Surgeon: Daneil Dolin, MD;  [facility];  Service: Endoscopy;  Laterality: N/A;  9:30  . Left cataract surgery    . POLYPECTOMY  04/14/2019   Procedure: POLYPECTOMY;  Surgeon: Daneil Dolin, MD;  [facility];  Service: Endoscopy;;     There were no vitals filed for this visit.   Subjective Assessment - 03/09/20 1344    Subjective COVID-19 screen performed prior to patient entering clinic.  Patient arrived today with no pain today. He reported having a shot monday and doing good upon arrival.    Pertinent History AAA, CAD, TIA, Stroke, Aorta--bilateral femoral artery bypass graft.    Limitations Standing    How long can you stand comfortably? Short time.    How long can you walk comfortably? Short community distances.    Patient Stated Goals Decrease pain.    Currently in Pain? No/denies                             [facility] Adult PT Treatment/Exercise - 03/09/20 0001      Exercises   Exercises Knee/Hip;Lumbar      Lumbar Exercises: Aerobic   Nustep L4 x84min UE/LE activity, monitored for progression      Lumbar Exercises: Seated   Other Seated Lumbar Exercises core sets with red swiss ball x84min      Lumbar Exercises: Supine   Ab Set 10 reps;5 seconds    Glut Set 10 reps;5 seconds    Bent Knee Raise 3 seconds   2x10   Straight Leg Raise 2 seconds   2x10     Knee/Hip Exercises: Standing   Hip Abduction Stengthening;Right;2  sets;10 reps;Knee straight    Forward Step Up Step Height: 6";20 reps;Right    Rocker Board 3 minutes   balance/stretch   Other Standing Knee Exercises marching 2x10 RT LE      Knee/Hip Exercises: Seated   Long Arc Quad Strengthening;Right;10 reps;3 sets    [employer] Weight 3 lbs.    Hamstring Curl Strengthening;Right;20 reps    Hamstring Limitations red band                  PT Education - 03/09/20 1354    Education Details HEP    Person(s) Educated Patient    Methods Explanation;Demonstration;Handout    Comprehension Verbalized understanding;Returned demonstration                      Plan - 03/09/20 1422    Clinical Impression Statement Today patient arrived with no pain due to a shot moday from MD. Today focused on core  activation and progression and right LE strengthening due to some weakness. Patient able to progress with no pain. HEP provided for core exercises today. Patient tolerated well today and will progress per PT recommendation. No modalities needed today.    Personal Factors and Comorbidities Comorbidity 1;Comorbidity 2;Other    Comorbidities AAA, CAD, TIA, Stroke, Aorta--bilateral femoral artery bypass graft.    Examination-Activity Limitations Other    Examination-Participation Restrictions Other    Stability/Clinical Decision Making Evolving/Moderate complexity    Rehab Potential Good    PT Frequency 2x / week    PT Duration 6 weeks    PT Treatment/Interventions ADLs/Self Care Home Management;Cryotherapy;Electrical Stimulation;Ultrasound;Moist Heat;Gait training;Functional mobility training;Therapeutic activities;Therapeutic exercise;Manual techniques;Patient/family education;Passive range of motion    PT Next Visit Plan Core exercise progression.  Modalities and STW/M as needed. Follow up sent  to PT for goals    Consulted and Agree with Plan of Care Patient           Patient will benefit from skilled therapeutic intervention in order to improve the following deficits and impairments:  Pain,Decreased activity tolerance,Decreased strength,Decreased range of motion,Abnormal gait  Visit Diagnosis: Acute bilateral low back pain with bilateral sciatica  Abnormal posture     Problem List Patient Active Problem List   Diagnosis Date Noted  . Hospital discharge follow-up 02/15/2020  . Lumbar spinal stenosis 02/06/2020  . Cerebral thrombosis with cerebral infarction 02/05/2020  . Meningioma, cerebral ([place]) 02/05/2020  . 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 Right hemiparesis ([place]) 02/04/2020  . Chronic midline low back pain without sciatica 04/27/2019  . Tobacco abuse 11/17/2018  . PVD (peripheral vascular disease) ([place]) 05/21/2012  . Atherosclerosis of native arteries of the extremities  with ulceration ([place]) 01/23/2012  . Mixed hyperlipidemia 12/13/2011  . Coronary atherosclerosis of native coronary artery 12/13/2011  . Aneurysm of abdominal vessel [facility]) 12/12/2011    Ladean Raya, PTA 03/09/20 2:29 PM  [facility] [place], [place], [address] Phone: [phone]   Fax:  [phone]  Name: Darryl Ward MRN: [id number] Date of Birth: 12-Dec-1945

## 2020-03-14 ENCOUNTER — Ambulatory Visit: Payer: Medicare Other | Admitting: Physical Therapy

## 2020-03-14 ENCOUNTER — Encounter: Payer: Self-pay | Admitting: Physical Therapy

## 2020-03-14 ENCOUNTER — Other Ambulatory Visit: Payer: Self-pay

## 2020-03-14 DIAGNOSIS — R293 Abnormal posture: Secondary | ICD-10-CM | POA: Diagnosis not present

## 2020-03-14 DIAGNOSIS — M5441 Lumbago with sciatica, right side: Secondary | ICD-10-CM

## 2020-03-14 DIAGNOSIS — M5442 Lumbago with sciatica, left side: Secondary | ICD-10-CM

## 2020-03-14 NOTE — Therapy (Signed)
[facility] [place], [place], [address] Phone: [phone]   Fax:  [phone]  Physical Therapy Treatment  Patient Details  Name: Darryl Ward MRN: [id number] Date of Birth: 1945/10/16 Referring Provider (PT): Duffy Rhody MD   Encounter Date: 03/14/2020   PT End of Session - 03/14/20 1348    Visit Number 3    Number of Visits 12    Date for PT Re-Evaluation 05/28/20    Authorization Type FOTO AT LEAST EVERY 5TH VISIT.  PROGRESS NOTE AT 10TH VISIT.  KX MODIFIER AFTER 15 VISITS.    PT Start Time 1347    PT Stop Time 1429    PT Time Calculation (min) 42 min    Activity Tolerance Patient tolerated treatment well    Behavior During Therapy WFL for tasks assessed/performed           Past Medical History:  Diagnosis Date  . AAA (abdominal aortic aneurysm) ([place])   . Carotid artery disease (HCC)    Nonobstructive  . Cataract   . Coronary atherosclerosis of native coronary artery    PTCA small diagonal 2007 otherwise nonobstructive CAD, EF 50-55%  . Essential hypertension, benign   . Hyperlipidemia   . NSTEMI (non-ST elevated myocardial infarction) ([place]) 2007  . Stroke [facility]) 2004  . TIA (transient ischemic attack) 2006    Past Surgical History:  Procedure Laterality Date  . AORTA - BILATERAL FEMORAL ARTERY BYPASS GRAFT  01/08/2012   Procedure: AORTA BIFEMORAL BYPASS GRAFT;  Surgeon: Elam Dutch, MD;  Location: [facility] OR;  Service: Vascular;  Laterality: Bilateral;  using 18x52mm x 40cm Hemashield Gold Vascular Graft with Endarterectomy, Thombectomy and  Reimplantation of Inferior Mesenteric Artery  . COLONOSCOPY N/A 04/14/2019   Procedure: COLONOSCOPY;  Surgeon: Daneil Dolin, MD;  [facility];  Service: Endoscopy;  Laterality: N/A;  9:30  . Left cataract surgery    . POLYPECTOMY  04/14/2019   Procedure: POLYPECTOMY;  Surgeon: Daneil Dolin, MD;  [facility];  Service: Endoscopy;;     There were no vitals filed for this visit.   Subjective Assessment - 03/14/20 1346    Subjective COVID-19 screen performed prior to patient entering clinic. Reports his back is better but still hurting some today. Shot helped for about 3 days but he returns to [facility] on 03/20/2020.    Pertinent History AAA, CAD, TIA, Stroke, Aorta--bilateral femoral artery bypass graft.    Limitations Standing    How long can you stand comfortably? Short time.    How long can you walk comfortably? Short community distances.    Patient Stated Goals Decrease pain.    Currently in Pain? Yes    Pain Score 6     Pain Location Back    Pain Orientation Right;Left    Pain Descriptors / Indicators Discomfort    Pain Type Acute pain    Pain Onset 1 to 4 weeks ago    Pain Frequency Constant              [facility] PT Assessment - 03/14/20 0001      Assessment   Medical Diagnosis Lumbar back pain with radiculopathy affecting right lower extremity.    Referring Provider (PT) Duffy Rhody MD    Onset Date/Surgical Date 02/04/20    Next MD Visit 03/20/2020      Precautions   Precautions Fall    Precaution Comments Recommend cane usage at all times.  Supervised gait.  Restrictions   Weight Bearing Restrictions No                         [facility] Adult PT Treatment/Exercise - 03/14/20 0001      Lumbar Exercises: Aerobic   Nustep L4 x21min UE/LE activity, monitored for progression      Lumbar Exercises: Seated   Long Arc Quad on Chair Strengthening;Both;10 reps;Weights    LAQ on Chair Limitations 4    Hip Flexion on Ball AROM;Both;15 reps    Other Seated Lumbar Exercises core sets with red swiss ball x15 reps 5 sec holds      Lumbar Exercises: Supine   Ab Set 10 reps;5 seconds    Bent Knee Raise 15 reps;3 seconds    Bridge 15 reps;3 seconds      Knee/Hip Exercises: Standing   Hip Abduction Stengthening;Both;2 sets;10 reps;Knee straight      Modalities   Modalities Transport planner Location B low back    Electrical Stimulation Action Pre-mod    Electrical Stimulation Parameters 80-150 hz x10 min    Electrical Stimulation Goals Pain                       PT Long Term Goals - 03/09/20 1441      PT LONG TERM GOAL #1   Title Ind with HEP.    Time 6    Period Weeks    Status New      PT LONG TERM GOAL #2   Title Perform ADL's with pain not > 3-4/10.    Time 6    Period Weeks    Status New      PT LONG TERM GOAL #3   Title Eliminate LE symptoms.    Time 6    Period Weeks    Status New                 Plan - 03/14/20 1402    Clinical Impression Statement Patient presented in clinic with reports of continued pain. Patient reported relief from shot lasting about 3 days total but no longer having the pain down his LEs. Patient guided through various therex with no complaints of any increased pain. Patient did not greater weakness with R LAQ. Normal stimulation response noted following removal of the modality.    Personal Factors and Comorbidities Comorbidity 1;Comorbidity 2;Other    Comorbidities AAA, CAD, TIA, Stroke, Aorta--bilateral femoral artery bypass graft.    Examination-Activity Limitations Other    Examination-Participation Restrictions Other    Stability/Clinical Decision Making Evolving/Moderate complexity    Rehab Potential Good    PT Frequency 2x / week    PT Duration 6 weeks    PT Treatment/Interventions ADLs/Self Care Home Management;Cryotherapy;Electrical Stimulation;Ultrasound;Moist Heat;Gait training;Functional mobility training;Therapeutic activities;Therapeutic exercise;Manual techniques;Patient/family education;Passive range of motion    PT Next Visit Plan Core exercise progression.  Modalities and STW/M as needed. Follow up sent  to PT for goals    Consulted and Agree with Plan of Care Patient           Patient will benefit from skilled therapeutic  intervention in order to improve the following deficits and impairments:  Pain,Decreased activity tolerance,Decreased strength,Decreased range of motion,Abnormal gait  Visit Diagnosis: Acute bilateral low back pain with bilateral sciatica  Abnormal posture     Problem List Patient Active Problem List   Diagnosis Date Noted  .  Hospital discharge follow-up 02/15/2020  . Lumbar spinal stenosis 02/06/2020  . Cerebral thrombosis with cerebral infarction 02/05/2020  . Meningioma, cerebral ([place]) 02/05/2020  . 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 Right hemiparesis ([place]) 02/04/2020  . Chronic midline low back pain without sciatica 04/27/2019  . Tobacco abuse 11/17/2018  . PVD (peripheral vascular disease) ([place]) 05/21/2012  . Atherosclerosis of native arteries of the extremities with ulceration ([place]) 01/23/2012  . Mixed hyperlipidemia 12/13/2011  . Coronary atherosclerosis of native coronary artery 12/13/2011  . Aneurysm of abdominal vessel [facility]) 12/12/2011    Standley Brooking, PTA 03/14/2020, 5:43 PM  [facility] [place], [place], [address] Phone: [phone]   Fax:  [phone]  Name: Darryl Ward MRN: [id number] Date of Birth: [birth year]

## 2020-03-20 DIAGNOSIS — M5416 Radiculopathy, lumbar region: Secondary | ICD-10-CM | POA: Diagnosis not present

## 2020-03-21 ENCOUNTER — Ambulatory Visit: Payer: Medicare Other | Attending: Neurosurgery | Admitting: Physical Therapy

## 2020-03-21 ENCOUNTER — Encounter: Payer: Self-pay | Admitting: Physical Therapy

## 2020-03-21 ENCOUNTER — Telehealth: Payer: Self-pay | Admitting: Cardiology

## 2020-03-21 ENCOUNTER — Other Ambulatory Visit: Payer: Self-pay

## 2020-03-21 DIAGNOSIS — M5442 Lumbago with sciatica, left side: Secondary | ICD-10-CM | POA: Insufficient documentation

## 2020-03-21 DIAGNOSIS — M5441 Lumbago with sciatica, right side: Secondary | ICD-10-CM | POA: Diagnosis not present

## 2020-03-21 DIAGNOSIS — R293 Abnormal posture: Secondary | ICD-10-CM | POA: Diagnosis not present

## 2020-03-21 NOTE — Telephone Encounter (Signed)
° °  [facility] Pre-operative Risk Assessment    HEARTCARE STAFF: - Please ensure there is not already an duplicate clearance open for this procedure. - Under Visit Info/Reason for Call, type in Other and utilize the format Clearance MM/DD/YY or Clearance TBD. Do not use dashes or single digits. - If request is for dental extraction, please clarify the # of teeth to be extracted.  Request for surgical clearance:  1. What type of surgery is being performed? L5-S1 Lumbar Fusion  2. When is this surgery scheduled? TBD  3. What type of clearance is required (medical clearance vs. Pharmacy clearance to hold med vs. Both)? Both  4. Are there any medications that need to be held prior to surgery and how long? Please Advise  5. Practice name and name of physician performing surgery? Francee Gentile at [facility]  6. What is the office phone number? [phone]   7.   What is the office fax number? [phone]  8.   Anesthesia type (None, local, MAC, general) ? General   Nelda Marseille 03/21/2020, 8:39 AM  _________________________________________________________________   (provider comments below)

## 2020-03-21 NOTE — Therapy (Signed)
[facility] [place], [place], [address] Phone: [phone]   Fax:  [phone]  Physical Therapy Treatment  Patient Details  Name: Darryl Ward MRN: [id number] Date of Birth: 08-29-1945 Referring Provider (PT): Duffy Rhody MD   Encounter Date: 03/21/2020   PT End of Session - 03/21/20 1349    Visit Number 4    Number of Visits 12    Date for PT Re-Evaluation 05/28/20    Authorization Type FOTO AT LEAST EVERY 5TH VISIT.  PROGRESS NOTE AT 10TH VISIT.  KX MODIFIER AFTER 15 VISITS.    PT Start Time 1347    PT Stop Time 1430    PT Time Calculation (min) 43 min    Activity Tolerance Patient tolerated treatment well    Behavior During Therapy WFL for tasks assessed/performed           Past Medical History:  Diagnosis Date  . AAA (abdominal aortic aneurysm) ([place])   . Carotid artery disease (HCC)    Nonobstructive  . Cataract   . Coronary atherosclerosis of native coronary artery    PTCA small diagonal 2007 otherwise nonobstructive CAD, EF 50-55%  . Essential hypertension, benign   . Hyperlipidemia   . NSTEMI (non-ST elevated myocardial infarction) ([place]) 2007  . Stroke [facility]) 2004  . TIA (transient ischemic attack) 2006    Past Surgical History:  Procedure Laterality Date  . AORTA - BILATERAL FEMORAL ARTERY BYPASS GRAFT  01/08/2012   Procedure: AORTA BIFEMORAL BYPASS GRAFT;  Surgeon: Elam Dutch, MD;  Location: [facility] OR;  Service: Vascular;  Laterality: Bilateral;  using 18x60m x 40cm Hemashield Gold Vascular Graft with Endarterectomy, Thombectomy and  Reimplantation of Inferior Mesenteric Artery  . COLONOSCOPY N/A 04/14/2019   Procedure: COLONOSCOPY;  Surgeon: RDaneil Dolin MD;  [facility];  Service: Endoscopy;  Laterality: N/A;  9:30  . Left cataract surgery    . POLYPECTOMY  04/14/2019   Procedure: POLYPECTOMY;  Surgeon: RDaneil Dolin MD;  [facility];  Service: Endoscopy;;     There were no vitals filed for this visit.   Subjective Assessment - 03/21/20 1348    Subjective COVID-19 screen performed prior to patient entering clinic. Reports that he seen MD and MD suggested surgery. Going through preop visits now. MD did not say anything about stopping or continuing PT.    Pertinent History AAA, CAD, TIA, Stroke, Aorta--bilateral femoral artery bypass graft.    Limitations Standing    How long can you stand comfortably? Short time.    How long can you walk comfortably? Short community distances.    Patient Stated Goals Decrease pain.    Currently in Pain? Yes    Pain Score 7     Pain Location Back    Pain Orientation Left;Right    Pain Descriptors / Indicators Discomfort    Pain Type Acute pain    Pain Onset 1 to 4 weeks ago    Pain Frequency Constant              [facility] PT Assessment - 03/21/20 0001      Assessment   Medical Diagnosis Lumbar back pain with radiculopathy affecting right lower extremity.    Referring Provider (PT) JDuffy RhodyMD    Onset Date/Surgical Date 02/04/20    Next MD Visit 03/20/2020      Precautions   Precautions Fall    Precaution Comments Recommend cane usage at all times.  Supervised gait.  Restrictions   Weight Bearing Restrictions No                         [facility] Adult PT Treatment/Exercise - 03/21/20 0001      Lumbar Exercises: Aerobic   Nustep L5 x15 min      Lumbar Exercises: Machines for Strengthening   Cybex Lumbar Extension 60# x20 reps    Cybex Knee Extension 10# 2x10 reps    Cybex Knee Flexion 30# 3x10 reps      Lumbar Exercises: Supine   Bridge 10 reps;3 seconds    Straight Leg Raise 15 reps      Knee/Hip Exercises: Standing   Heel Raises Both;20 reps    Hip Abduction Stengthening;Both;2 sets;10 reps;Knee straight                       PT Long Term Goals - 03/21/20 1433      PT LONG TERM GOAL #1   Title Ind with HEP.    Time 6    Period Weeks     Status Partially Met      PT LONG TERM GOAL #2   Title Perform ADL's with pain not > 3-4/10.    Time 6    Period Weeks    Status Achieved      PT LONG TERM GOAL #3   Title Eliminate LE symptoms.    Time 6    Period Weeks    Status Not Met                 Plan - 03/21/20 1435    Clinical Impression Statement Patient presented in clinic with reports of continued LBP with radicular pain down LEs. Patient recently saw MD to which he was scheduled for surgery. Patient progressed through various LEs and lumbar strengthening. Patient limited with briding due to pain. Muscle fatigue noted throughout therex in LEs. Patient able to achieve ADLs goal at this time but wished to be put on hold due to surgery.    Personal Factors and Comorbidities Comorbidity 1;Comorbidity 2;Other    Comorbidities AAA, CAD, TIA, Stroke, Aorta--bilateral femoral artery bypass graft.    Examination-Activity Limitations Other    Examination-Participation Restrictions Other    Stability/Clinical Decision Making Evolving/Moderate complexity    Rehab Potential Good    PT Frequency 2x / week    PT Duration 6 weeks    PT Treatment/Interventions ADLs/Self Care Home Management;Cryotherapy;Electrical Stimulation;Ultrasound;Moist Heat;Gait training;Functional mobility training;Therapeutic activities;Therapeutic exercise;Manual techniques;Patient/family education;Passive range of motion    PT Next Visit Plan On hold due to surgery.    Consulted and Agree with Plan of Care Patient           Patient will benefit from skilled therapeutic intervention in order to improve the following deficits and impairments:  Pain,Decreased activity tolerance,Decreased strength,Decreased range of motion,Abnormal gait  Visit Diagnosis: Acute bilateral low back pain with bilateral sciatica  Abnormal posture     Problem List Patient Active Problem List   Diagnosis Date Noted  . Hospital discharge follow-up 02/15/2020  . Lumbar  spinal stenosis 02/06/2020  . Cerebral thrombosis with cerebral infarction 02/05/2020  . Meningioma, cerebral ([place]) 02/05/2020  . 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 Right hemiparesis ([place]) 02/04/2020  . Chronic midline low back pain without sciatica 04/27/2019  . Tobacco abuse 11/17/2018  . PVD (peripheral vascular disease) ([place]) 05/21/2012  . Atherosclerosis of native arteries of the extremities with ulceration (  [place]) 01/23/2012  . Mixed hyperlipidemia 12/13/2011  . Coronary atherosclerosis of native coronary artery 12/13/2011  . Aneurysm of abdominal vessel ([place]) 12/12/2011    Standley Brooking, PTA 03/21/2020, 3:53 PM  [facility] [place], [place], [address] Phone: [phone]   Fax:  [phone]  Name: AIRRION OTTING MRN: [id number] Date of Birth: [birth year]

## 2020-03-21 NOTE — Telephone Encounter (Signed)
   Primary Cardiologist: last seen by Dr. Domenic Polite in 2014  Chart reviewed as part of pre-operative protocol coverage.  Last seen in 2014.  Because of Darryl Ward's past medical history and time since last visit, he will require a follow-up visit in order to better assess preoperative cardiovascular risk.  Pre-op covering staff: - Please schedule appointment and call patient to inform them. If patient already had an upcoming appointment within acceptable timeframe, please add "pre-op clearance" to the appointment notes so provider is aware. - Please contact requesting surgeon's office via preferred method (i.e, phone, fax) to inform them of need for appointment prior to surgery.   Richardson Dopp, PA-C  03/21/2020, 8:57 AM

## 2020-03-21 NOTE — Telephone Encounter (Signed)
Pt has appt with you 03/31/20.  Please send clearance notes to surgeon. Richardson Dopp, PA-C    03/21/2020 1:36 PM

## 2020-03-21 NOTE — Telephone Encounter (Signed)
Pt has been scheduled to see Dr. Percival Spanish 03/31/20. I will forward notes to MD for upcoming appt. Will send FYI to requesting office Dr. Francee Gentile pt has New pt appt 03/31/20.

## 2020-03-21 NOTE — Telephone Encounter (Signed)
Pt is going to need a NEW PT APPT FOR PRE OP CLEARANCE. Pt last seen 2014 with Dr. Domenic Polite in [place]. I will send a message to Harvey as well as to Guinevere Ferrari, CMA in our Chart prep team. I will send FYI to the requesting office pt will need New Pt appt.

## 2020-03-30 NOTE — Progress Notes (Signed)
Cardiology Office Note   Date:  03/31/2020   ID:  Darryl Ward, DOB Jan 27, 1945, MRN [id number]  PCP:  Darryl Pretty, FNP  Cardiologist:   No primary care provider on file. Referring:  Darryl Pretty, FNP  Chief Complaint  Patient presents with  . PVD      History of Present Illness: Darryl Ward is a 75 y.o. male who is referred by  presents for preop clearance prior to back surgery.   He is referred by Darryl Pretty, FNP  I saw him back in 2007.  He was seeing Dr. Domenic Ward.   He had a stress perfusion study prior to aortobifem bypass that occurred in 2013.  He was last seen by Darryl Ward in 2014.  He was admitted in January with questionable stroke.  I did review these records for this visit.  There were no acute findings.  2D echo was essentially unremarkable with a normal ejection fraction.  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  He had a left parotid tumor. This was biopsied.   There was a meningioma noted.  Prior to January he was very active at work.  She denies any cardiovascular symptoms.  He would not have chest discomfort, neck or arm discomfort.  He was not having any shortness of breath, PND or orthopnea.  He was not having any palpitations, presyncope or syncope.  He unfortunately is still smoking cigarettes.    Past Medical History:  Diagnosis Date  . AAA (abdominal aortic aneurysm) (Darryl Ward)   . Carotid artery disease (HCC)    Nonobstructive  . Cataract   . Coronary atherosclerosis of native coronary artery    PTCA small diagonal 2007 otherwise nonobstructive CAD, EF 50-55%  . Essential hypertension, benign   . Hyperlipidemia   . NSTEMI (non-ST elevated myocardial infarction) (Darryl Ward) 2007  . Stroke Darryl Ward) 2004  . TIA (transient ischemic attack) 2006    Past Surgical History:  Procedure Laterality Date  . AORTA - BILATERAL FEMORAL ARTERY BYPASS GRAFT  01/08/2012   Procedure: AORTA BIFEMORAL BYPASS GRAFT;  Surgeon: Elam Dutch, MD;   Location: [facility];  Service: Vascular;  Laterality: Bilateral;  using 18x76mm x 40cm Hemashield Gold Vascular Graft with Endarterectomy, Thombectomy and  Reimplantation of Inferior Mesenteric Artery  . COLONOSCOPY N/A 04/14/2019   Procedure: COLONOSCOPY;  Surgeon: Darryl Dolin, MD;  [facility];  Service: Endoscopy;  Laterality: N/A;  9:30  . Left cataract surgery    . POLYPECTOMY  04/14/2019   Procedure: POLYPECTOMY;  Surgeon: Darryl Dolin, MD;  Location: [facility];  Service: Endoscopy;;     Current Outpatient Medications  Medication Sig Dispense Refill  . aspirin EC 81 MG tablet Take 1 tablet (81 mg total) by mouth daily. 90 tablet 3  . atorvastatin (LIPITOR) 80 MG tablet Take 1 tablet (80 mg total) by mouth daily. 90 tablet 3  . celecoxib (CELEBREX) 200 MG capsule Take 1 capsule (200 mg total) by mouth 2 (two) times daily. 180 capsule 3  . metoprolol succinate (TOPROL-XL) 25 MG 24 hr tablet Take 1 tablet (25 mg total) by mouth daily. 90 tablet 3   No current facility-administered medications for this visit.    Allergies:   Patient has no known allergies.    Social History:  The patient  reports that he has been smoking cigarettes. He has a 40.00 pack-year smoking history. He has never used smokeless tobacco. He reports that he does not drink alcohol  and does not use drugs.   Family History:  The patient's family history includes Heart attack (age of onset: 18) in his brother; Hyperlipidemia in his sister.    ROS:  Please see the history of present illness.   Otherwise, review of systems are positive for none.   All other systems are reviewed and negative.    PHYSICAL EXAM: VS:  BP 138/62 (BP Location: Left Arm, Patient Position: Sitting, Cuff Size: Normal)   Pulse 73   Ht 5\' 10"  (1.778 m)   Wt 180 lb 3.2 oz (81.7 kg)   SpO2 96%   BMI 25.86 kg/m  , BMI Body mass index is 25.86 kg/m. GENERAL:  Well appearing HEENT:  Pupils equal round and reactive, fundi not  visualized, oral mucosa unremarkable NECK:  No jugular venous distention, waveform within normal limits, carotid upstroke brisk and symmetric, no bruits, no thyromegaly LYMPHATICS:  No cervical, inguinal adenopathy LUNGS:  Clear to auscultation bilaterally BACK:  No CVA tenderness CHEST:  Unremarkable HEART:  PMI not displaced or sustained,S1 and S2 within normal limits, no S3, no S4, no clicks, no rubs, no murmurs ABD:  Flat, positive bowel sounds normal in frequency in pitch, no bruits, no rebound, no guarding, no midline pulsatile mass, no hepatomegaly, no splenomegaly EXT:  2 plus pulses in DP/PT, no edema, no cyanosis no clubbing SKIN:  No rashes no nodules NEURO:  Cranial nerves II through XII grossly intact, motor grossly intact throughout PSYCH:  Cognitively intact, oriented to person place and time    EKG:  EKG is not ordered today. The ekg ordered 02/03/2018 so demonstrates sinus rhythm, rate 59, axis within normal limits, intervals within normal limits, premature atrial contractions, no acute ST-T wave changes.   Recent Labs: 02/04/2020: ALT 10 02/15/2020: BUN 16; Creatinine, Ser 0.97; Hemoglobin 14.0; Platelets 167; Potassium 4.2; Sodium 142    Lipid Panel    Component Value Date/Time   CHOL 202 (H) 02/05/2020 0755   CHOL 198 11/25/2019 1123   TRIG 99 02/05/2020 0755   HDL 29 (L) 02/05/2020 0755   HDL 36 (L) 11/25/2019 1123   CHOLHDL 7.0 02/05/2020 0755   VLDL 20 02/05/2020 0755   LDLCALC 153 (H) 02/05/2020 0755   LDLCALC 139 (H) 11/25/2019 1123      Wt Readings from Last 3 Encounters:  03/31/20 180 lb 3.2 oz (81.7 kg)  03/07/20 180 lb (81.6 kg)  02/15/20 181 lb (82.1 kg)      Other studies Reviewed: Additional studies/ records that were reviewed today include: ED records. Review of the above records demonstrates:  Please see elsewhere in the note.     ASSESSMENT AND PLAN:  Preop: The patient has no high risk findings.  He had a very high functional level  very recently.  He is not going for high risk procedure.  Therefore, according to ACC/AHA guidelines no further cardiovascular testing is suggested.  PVD: He needs continued risk reduction.  In particular we talked about the need to stop smoking.  Dyslipidemia: LDL is not at target in the [facility] recently.  His Lipitor was increased and he is going to have this followed by his primary provider with goal LDL less than 70.  HTN: His blood pressure is well controlled.  No change in therapy.   Current medicines are reviewed at length with the patient today.  The patient does not have concerns regarding medicines.  The following changes have been made:  no change  Labs/ tests ordered today  include: None  Orders Placed This Encounter  Procedures  . EKG 12-Lead     Disposition:   FU with me as needed.     Signed, Minus Breeding, MD  03/31/2020 1:51 PM    [place]

## 2020-03-31 ENCOUNTER — Encounter: Payer: Self-pay | Admitting: Cardiology

## 2020-03-31 ENCOUNTER — Other Ambulatory Visit: Payer: Self-pay

## 2020-03-31 ENCOUNTER — Telehealth: Payer: Self-pay

## 2020-03-31 ENCOUNTER — Ambulatory Visit: Payer: Medicare Other | Admitting: Cardiology

## 2020-03-31 VITALS — BP 138/62 | HR 73 | Ht 70.0 in | Wt 180.2 lb

## 2020-03-31 DIAGNOSIS — Z01810 Encounter for preprocedural cardiovascular examination: Secondary | ICD-10-CM

## 2020-03-31 DIAGNOSIS — I10 Essential (primary) hypertension: Secondary | ICD-10-CM

## 2020-03-31 DIAGNOSIS — I739 Peripheral vascular disease, unspecified: Secondary | ICD-10-CM

## 2020-03-31 DIAGNOSIS — E785 Hyperlipidemia, unspecified: Secondary | ICD-10-CM | POA: Diagnosis not present

## 2020-03-31 NOTE — Telephone Encounter (Signed)
Pt had appt with Dr Percival Spanish 03-31-20, forwarded note to requesting party via EPIC fax function

## 2020-03-31 NOTE — Patient Instructions (Signed)
Medication Instructions:  The current medical regimen is effective;  continue present plan and medications as directed. Please refer to the Current Medication list given to you today.  *If you need a refill on your cardiac medications before your next appointment, please call your pharmacy*  Follow-Up: Your next appointment:  Follow up as NEEDED     At [facility], you and your health needs are our priority.  As part of our continuing mission to provide you with exceptional heart care, we have created designated Provider Care Teams.  These Care Teams include your primary Cardiologist (physician) and Advanced Practice Providers (APPs -  Physician Assistants and Nurse Practitioners) who all work together to provide you with the care you need, when you need it. [name]

## 2020-03-31 NOTE — Telephone Encounter (Signed)
Please review surgical clearance form on desk

## 2020-04-04 ENCOUNTER — Other Ambulatory Visit: Payer: Self-pay | Admitting: Neurosurgery

## 2020-04-21 ENCOUNTER — Ambulatory Visit (INDEPENDENT_AMBULATORY_CARE_PROVIDER_SITE_OTHER): Payer: Medicare Other | Admitting: Nurse Practitioner

## 2020-04-21 ENCOUNTER — Encounter: Payer: Self-pay | Admitting: Nurse Practitioner

## 2020-04-21 ENCOUNTER — Other Ambulatory Visit: Payer: Self-pay

## 2020-04-21 VITALS — BP 119/61 | HR 62 | Temp 97.9°F | Resp 20 | Ht 70.0 in | Wt 180.0 lb

## 2020-04-21 DIAGNOSIS — I6330 Cerebral infarction due to thrombosis of unspecified cerebral artery: Secondary | ICD-10-CM

## 2020-04-21 DIAGNOSIS — I739 Peripheral vascular disease, unspecified: Secondary | ICD-10-CM

## 2020-04-21 DIAGNOSIS — E782 Mixed hyperlipidemia: Secondary | ICD-10-CM

## 2020-04-21 DIAGNOSIS — G8929 Other chronic pain: Secondary | ICD-10-CM

## 2020-04-21 DIAGNOSIS — I2510 Atherosclerotic heart disease of native coronary artery without angina pectoris: Secondary | ICD-10-CM

## 2020-04-21 DIAGNOSIS — M545 Low back pain, unspecified: Secondary | ICD-10-CM

## 2020-04-21 DIAGNOSIS — I6930 Unspecified sequelae of cerebral infarction: Secondary | ICD-10-CM | POA: Diagnosis not present

## 2020-04-21 NOTE — Progress Notes (Signed)
Subjective:    Patient ID: Darryl Ward, male    DOB: [birth year], 75 y.o.   MRN: [id number]   Chief Complaint: Medical Management of Chronic Issues    HPI:  1. Mixed hyperlipidemia Taking atorvastatin tolerating well. Does not follow any specific diet.   2. Atherosclerosis of native coronary artery of native heart without angina pectoris No CP, Sob, CP on exertion. Follows with Dr. Domenic Polite.   3. PVD (peripheral vascular disease) ([place]) Follows with Dr. Gwen Her. On baby ASA and atorvastatin.   4. Cerebral thrombosis with cerebral infarction Follows with neurology. Has a meningioma and they are only following. Continues to have some unsteadiness. Last cerebral event was in January. On ASA. He has not been back to rehab and plans on going back after his surgery.   5. Chronic back pain Taking celebrex. Having surgery next week. Hoping this gives him some relief.    Outpatient Encounter Medications as of 04/21/2020  Medication Sig  . atorvastatin (LIPITOR) 80 MG tablet Take 1 tablet (80 mg total) by mouth daily.  . celecoxib (CELEBREX) 200 MG capsule Take 1 capsule (200 mg total) by mouth 2 (two) times daily.  . metoprolol succinate (TOPROL-XL) 25 MG 24 hr tablet Take 1 tablet (25 mg total) by mouth daily.  [name] aspirin EC 81 MG tablet Take 1 tablet (81 mg total) by mouth daily. (Patient not taking: Reported on 04/21/2020)   No facility-administered encounter medications on file as of 04/21/2020.    Past Surgical History:  Procedure Laterality Date  . AORTA - BILATERAL FEMORAL ARTERY BYPASS GRAFT  01/08/2012   Procedure: AORTA BIFEMORAL BYPASS GRAFT;  Surgeon: Elam Dutch, MD;  Location[facility] OR;  Service: Vascular;  Laterality: Bilateral;  using 18x43mm x 40cm Hemashield Gold Vascular Graft with Endarterectomy, Thombectomy and  Reimplantation of Inferior Mesenteric Artery  . COLONOSCOPY N/A 04/14/2019   Procedure: COLONOSCOPY;  Surgeon: Daneil Dolin, MD;  [facility];  Service: Endoscopy;  Laterality: N/A;  9:30  . Left cataract surgery    . POLYPECTOMY  04/14/2019   Procedure: POLYPECTOMY;  Surgeon: Daneil Dolin, MD;  [facility];  Service: Endoscopy;;    Family History  Problem Relation Age of Onset  . Hyperlipidemia Sister   . Heart attack Brother 57    New complaints: None.   Social history:   Controlled substance contract: N/A   Review of Systems  Respiratory: Negative for cough, shortness of breath and wheezing.   Cardiovascular: Negative for chest pain, palpitations and leg swelling.  Gastrointestinal: Negative for abdominal pain, constipation and diarrhea.  Genitourinary: Negative for difficulty urinating, dysuria and hematuria.  Musculoskeletal: Positive for back pain.  Neurological: Negative for dizziness, syncope, light-headedness and headaches.  Psychiatric/Behavioral: Negative for confusion. The patient is not nervous/anxious.    Vitals:   04/21/20 1211  BP: 119/61  Pulse: 62  Resp: 20  Temp: 97.9 F (36.6 C)  SpO2: 97%       Objective:   Physical Exam Eyes:     Extraocular Movements: Extraocular movements intact.     Conjunctiva/sclera: Conjunctivae normal.  Neck:     Vascular: No carotid bruit.     Comments: Left parotid gland mass noted Cardiovascular:     Rate and Rhythm: Normal rate and regular rhythm.     Pulses: Normal pulses.     Heart sounds: Normal heart sounds.  Pulmonary:     Effort: Pulmonary effort is normal.  Breath sounds: Normal breath sounds.  Abdominal:     General: Abdomen is flat. Bowel sounds are normal.     Palpations: Abdomen is soft.  Musculoskeletal:        General: Normal range of motion.     Cervical back: Normal range of motion and neck supple.  Skin:    General: Skin is warm and dry.     Capillary Refill: Capillary refill takes less than 2 seconds.  Neurological:     Mental Status: He is alert and oriented to person, place, and time.  Psychiatric:         Mood and Affect: Mood normal.        Behavior: Behavior normal.       Assessment & Plan:   Darryl Ward comes in today with chief complaint of Medical Management of Chronic Issues   Diagnosis and orders addressed:  1. Mixed hyperlipidemia Continue atorvastatin. Encouraged smoking cessation and healthy diet.   2. Atherosclerosis of native coronary artery of native heart without angina pectoris Continue to follow up with cardiology.   3. PVD (peripheral vascular disease) ([place]) Continue to follow up with cardiology  4. Cerebral thrombosis with cerebral infarction Continue to follow up with neurologist. Continue ASA  5. Chronic midline low back pain without sciatica Surgery scheduled for next wekk.    Labs pending Health Maintenance reviewed Diet and exercise encouraged  Follow up plan: After surgery  Dollene Primrose, RN, BSN, FNP-Student   Mary-Margaret Hassell Done, FNP

## 2020-04-25 ENCOUNTER — Encounter (HOSPITAL_COMMUNITY): Payer: Self-pay

## 2020-04-25 ENCOUNTER — Encounter (HOSPITAL_COMMUNITY)
Admission: RE | Admit: 2020-04-25 | Discharge: 2020-04-25 | Disposition: A | Discharge status: Home / Self Care | Payer: Medicare Other | Source: Ambulatory Visit | Attending: Neurosurgery | Admitting: Neurosurgery

## 2020-04-25 ENCOUNTER — Other Ambulatory Visit: Payer: Self-pay

## 2020-04-25 DIAGNOSIS — M48061 Spinal stenosis, lumbar region without neurogenic claudication: Secondary | ICD-10-CM | POA: Diagnosis not present

## 2020-04-25 DIAGNOSIS — Z8249 Family history of ischemic heart disease and other diseases of the circulatory system: Secondary | ICD-10-CM | POA: Diagnosis not present

## 2020-04-25 DIAGNOSIS — Z20822 Contact with and (suspected) exposure to covid-19: Secondary | ICD-10-CM | POA: Insufficient documentation

## 2020-04-25 DIAGNOSIS — I252 Old myocardial infarction: Secondary | ICD-10-CM | POA: Diagnosis not present

## 2020-04-25 DIAGNOSIS — Z01812 Encounter for preprocedural laboratory examination: Secondary | ICD-10-CM | POA: Insufficient documentation

## 2020-04-25 DIAGNOSIS — Z981 Arthrodesis status: Secondary | ICD-10-CM | POA: Diagnosis not present

## 2020-04-25 DIAGNOSIS — E782 Mixed hyperlipidemia: Secondary | ICD-10-CM | POA: Diagnosis not present

## 2020-04-25 DIAGNOSIS — G8191 Hemiplegia, unspecified affecting right dominant side: Secondary | ICD-10-CM | POA: Diagnosis not present

## 2020-04-25 DIAGNOSIS — Z83438 Family history of other disorder of lipoprotein metabolism and other lipidemia: Secondary | ICD-10-CM | POA: Diagnosis not present

## 2020-04-25 DIAGNOSIS — G8929 Other chronic pain: Secondary | ICD-10-CM | POA: Diagnosis not present

## 2020-04-25 DIAGNOSIS — M5416 Radiculopathy, lumbar region: Secondary | ICD-10-CM | POA: Diagnosis not present

## 2020-04-25 DIAGNOSIS — M1990 Unspecified osteoarthritis, unspecified site: Secondary | ICD-10-CM | POA: Diagnosis not present

## 2020-04-25 DIAGNOSIS — F17210 Nicotine dependence, cigarettes, uncomplicated: Secondary | ICD-10-CM | POA: Diagnosis not present

## 2020-04-25 DIAGNOSIS — I10 Essential (primary) hypertension: Secondary | ICD-10-CM | POA: Diagnosis not present

## 2020-04-25 DIAGNOSIS — M4807 Spinal stenosis, lumbosacral region: Secondary | ICD-10-CM | POA: Diagnosis not present

## 2020-04-25 DIAGNOSIS — I714 Abdominal aortic aneurysm, without rupture: Secondary | ICD-10-CM | POA: Diagnosis not present

## 2020-04-25 DIAGNOSIS — Z7982 Long term (current) use of aspirin: Secondary | ICD-10-CM | POA: Diagnosis not present

## 2020-04-25 DIAGNOSIS — M4727 Other spondylosis with radiculopathy, lumbosacral region: Secondary | ICD-10-CM | POA: Diagnosis not present

## 2020-04-25 DIAGNOSIS — I739 Peripheral vascular disease, unspecified: Secondary | ICD-10-CM | POA: Diagnosis not present

## 2020-04-25 DIAGNOSIS — M4326 Fusion of spine, lumbar region: Secondary | ICD-10-CM | POA: Diagnosis not present

## 2020-04-25 DIAGNOSIS — I2510 Atherosclerotic heart disease of native coronary artery without angina pectoris: Secondary | ICD-10-CM | POA: Diagnosis not present

## 2020-04-25 DIAGNOSIS — M5116 Intervertebral disc disorders with radiculopathy, lumbar region: Secondary | ICD-10-CM | POA: Diagnosis not present

## 2020-04-25 DIAGNOSIS — Z79899 Other long term (current) drug therapy: Secondary | ICD-10-CM | POA: Diagnosis not present

## 2020-04-25 DIAGNOSIS — Z8673 Personal history of transient ischemic attack (TIA), and cerebral infarction without residual deficits: Secondary | ICD-10-CM | POA: Diagnosis not present

## 2020-04-25 HISTORY — DX: Unspecified osteoarthritis, unspecified site: M19.90

## 2020-04-25 LAB — CBC
HCT: 40.2 % (ref 39.0–52.0)
Hemoglobin: 13.2 g/dL (ref 13.0–17.0)
MCH: 31.9 pg (ref 26.0–34.0)
MCHC: 32.8 g/dL (ref 30.0–36.0)
MCV: 97.1 fL (ref 80.0–100.0)
Platelets: 145 10*3/uL — ABNORMAL LOW (ref 150–400)
RBC: 4.14 MIL/uL — ABNORMAL LOW (ref 4.22–5.81)
RDW: 14.3 % (ref 11.5–15.5)
WBC: 10.2 10*3/uL (ref 4.0–10.5)
nRBC: 0 % (ref 0.0–0.2)

## 2020-04-25 LAB — BASIC METABOLIC PANEL
Anion gap: 5 (ref 5–15)
BUN: 14 mg/dL (ref 8–23)
CO2: 25 mmol/L (ref 22–32)
Calcium: 8.6 mg/dL — ABNORMAL LOW (ref 8.9–10.3)
Chloride: 108 mmol/L (ref 98–111)
Creatinine, Ser: 0.95 mg/dL (ref 0.61–1.24)
GFR, Estimated: >60 mL/min (ref >60)
Glucose, Bld: 118 mg/dL — ABNORMAL HIGH (ref 70–99)
Potassium: 3.8 mmol/L (ref 3.5–5.1)
Sodium: 138 mmol/L (ref 135–145)

## 2020-04-25 LAB — SURGICAL PCR SCREEN
MRSA, PCR: NEGATIVE
Staphylococcus aureus: NEGATIVE

## 2020-04-25 LAB — SARS CORONAVIRUS 2 (TAT 6-24 HRS): SARS Coronavirus 2: NEGATIVE

## 2020-04-25 NOTE — Progress Notes (Addendum)
Surgical Instructions    Your procedure is scheduled on 04/27/20.  Report to [facility] Main Entrance "A" at 5:30 A.M., then check in with the Admitting office.  Call this number if you have problems the morning of surgery:  [phone]   If you have any questions prior to your surgery date call [phone]: Open Monday-Friday 8am-4pm    Remember:  Do not eat or drink after midnight the night before your surgery     Take these medicines the morning of surgery with A SIP OF WATER: atorvastatin (LIPITOR)  metoprolol succinate (TOPROL-XL)  As of today, STOP taking any Aspirin (unless otherwise instructed by your surgeon) Aleve, Naproxen, Ibuprofen, Motrin, Advil, Goody's, BC's, all herbal medications, fish oil, and all vitamins.                     Do not wear jewelry            Do not wear lotions, powders, colognes, or deodorant.            Do not shave 48 hours prior to surgery.  Men may shave face and neck.            Do not bring valuables to the hospital.            [facility] is not responsible for any belongings or valuables.  Do NOT Smoke (Tobacco/Vaping) or drink Alcohol 24 hours prior to your procedure If you use a CPAP at night, you may bring all equipment for your overnight stay.   Contacts, glasses, dentures or bridgework may not be worn into surgery, please bring cases for these belongings   For patients admitted to the hospital, discharge time will be determined by your treatment team.   Patients discharged the day of surgery will not be allowed to drive home, and someone needs to stay with them for 24 hours.    Special instructions:   [place]- Preparing For Surgery  Before surgery, you can play an important role. Because skin is not sterile, your skin needs to be as free of germs as possible. You can reduce the number of germs on your skin by washing with CHG (chlorahexidine gluconate) Soap before surgery.  CHG is an antiseptic cleaner which kills germs and  bonds with the skin to continue killing germs even after washing.    Oral Hygiene is also important to reduce your risk of infection.  Remember - BRUSH YOUR TEETH THE MORNING OF SURGERY WITH YOUR REGULAR TOOTHPASTE  Please do not use if you have an allergy to CHG or antibacterial soaps. If your skin becomes reddened/irritated stop using the CHG.  Do not shave (including legs and underarms) for at least 48 hours prior to first CHG shower. It is OK to shave your face.  Please follow these instructions carefully.   1. Shower the NIGHT BEFORE SURGERY and the MORNING OF SURGERY  2. If you chose to wash your hair, wash your hair first as usual with your normal shampoo.  3. After you shampoo, rinse your hair and body thoroughly to remove the shampoo.  4. Wash Face and genitals (private parts) with your normal soap.   5.  Shower the NIGHT BEFORE SURGERY and the MORNING OF SURGERY with CHG Soap.   6. Use CHG Soap as you would any other liquid soap. You can apply CHG directly to the skin and wash gently with a scrungie or a clean washcloth.   7. Apply the  CHG Soap to your body ONLY FROM THE NECK DOWN.  Do not use on open wounds or open sores. Avoid contact with your eyes, ears, mouth and genitals (private parts). Wash Face and genitals (private parts)  with your normal soap.   8. Wash thoroughly, paying special attention to the area where your surgery will be performed.  9. Thoroughly rinse your body with warm water from the neck down.  10. DO NOT shower/wash with your normal soap after using and rinsing off the CHG Soap.  11. Pat yourself dry with a CLEAN TOWEL.  12. Wear CLEAN PAJAMAS to bed the night before surgery  13. Place CLEAN SHEETS on your bed the night before your surgery  14. DO NOT SLEEP WITH PETS.   Day of Surgery: Wear Clean/Comfortable clothing the morning of surgery Do not apply any deodorants/lotions.   Remember to brush your teeth WITH YOUR REGULAR TOOTHPASTE.    Please read over the following fact sheets that you were given.

## 2020-04-25 NOTE — Progress Notes (Signed)
PCP: [name], Np Cardiologist: Minus Breeding, MD  EKG: 02/04/20 CXR: 02/04/20 ECHO: 02/05/20 Stress Test: 12/30/11 Cardiac Cath: denies  Fasting Blood Sugar- na Checks Blood Sugar_na__ times a day  OSA/CPAP: No  ASA: Last dose 04/20/20 Blood Thinners: No  Covid test 04/25/20 at PAT  Anesthesia Review: No  Patient denies shortness of breath, fever, cough, and chest pain at PAT appointment.  Patient verbalized understanding of instructions provided today at the PAT appointment.  Patient asked to review instructions at home and day of surgery.

## 2020-04-26 LAB — TYPE AND SCREEN
ABO/RH(D): O NEG
Antibody Screen: NEGATIVE

## 2020-04-27 ENCOUNTER — Inpatient Hospital Stay (HOSPITAL_COMMUNITY): Payer: Medicare Other | Admitting: Anesthesiology

## 2020-04-27 ENCOUNTER — Inpatient Hospital Stay (HOSPITAL_COMMUNITY)
Admission: RE | Admit: 2020-04-27 | Discharge: 2020-04-28 | DRG: 454 | Disposition: A | Discharge status: Home / Self Care | Payer: Medicare Other | Attending: Neurosurgery | Admitting: Neurosurgery

## 2020-04-27 ENCOUNTER — Encounter (HOSPITAL_COMMUNITY)
Admission: RE | Disposition: A | Discharge status: Home / Self Care | Payer: Self-pay | Source: Home / Self Care | Attending: Neurosurgery

## 2020-04-27 ENCOUNTER — Inpatient Hospital Stay (HOSPITAL_COMMUNITY): Payer: Medicare Other

## 2020-04-27 ENCOUNTER — Other Ambulatory Visit: Payer: Self-pay

## 2020-04-27 ENCOUNTER — Encounter (HOSPITAL_COMMUNITY): Payer: Self-pay

## 2020-04-27 DIAGNOSIS — I714 Abdominal aortic aneurysm, without rupture: Secondary | ICD-10-CM | POA: Diagnosis present

## 2020-04-27 DIAGNOSIS — G8929 Other chronic pain: Secondary | ICD-10-CM | POA: Diagnosis present

## 2020-04-27 DIAGNOSIS — I739 Peripheral vascular disease, unspecified: Secondary | ICD-10-CM | POA: Diagnosis present

## 2020-04-27 DIAGNOSIS — M48061 Spinal stenosis, lumbar region without neurogenic claudication: Secondary | ICD-10-CM | POA: Diagnosis present

## 2020-04-27 DIAGNOSIS — Z8249 Family history of ischemic heart disease and other diseases of the circulatory system: Secondary | ICD-10-CM

## 2020-04-27 DIAGNOSIS — I252 Old myocardial infarction: Secondary | ICD-10-CM

## 2020-04-27 DIAGNOSIS — M5416 Radiculopathy, lumbar region: Principal | ICD-10-CM | POA: Diagnosis present

## 2020-04-27 DIAGNOSIS — M4326 Fusion of spine, lumbar region: Secondary | ICD-10-CM | POA: Diagnosis not present

## 2020-04-27 DIAGNOSIS — Z79899 Other long term (current) drug therapy: Secondary | ICD-10-CM | POA: Diagnosis not present

## 2020-04-27 DIAGNOSIS — G8191 Hemiplegia, unspecified affecting right dominant side: Secondary | ICD-10-CM | POA: Diagnosis present

## 2020-04-27 DIAGNOSIS — Z20822 Contact with and (suspected) exposure to covid-19: Secondary | ICD-10-CM | POA: Diagnosis present

## 2020-04-27 DIAGNOSIS — Z8673 Personal history of transient ischemic attack (TIA), and cerebral infarction without residual deficits: Secondary | ICD-10-CM | POA: Diagnosis not present

## 2020-04-27 DIAGNOSIS — E782 Mixed hyperlipidemia: Secondary | ICD-10-CM | POA: Diagnosis present

## 2020-04-27 DIAGNOSIS — Z83438 Family history of other disorder of lipoprotein metabolism and other lipidemia: Secondary | ICD-10-CM

## 2020-04-27 DIAGNOSIS — M1990 Unspecified osteoarthritis, unspecified site: Secondary | ICD-10-CM | POA: Diagnosis present

## 2020-04-27 DIAGNOSIS — I10 Essential (primary) hypertension: Secondary | ICD-10-CM | POA: Diagnosis present

## 2020-04-27 DIAGNOSIS — I2510 Atherosclerotic heart disease of native coronary artery without angina pectoris: Secondary | ICD-10-CM | POA: Diagnosis present

## 2020-04-27 DIAGNOSIS — Z7982 Long term (current) use of aspirin: Secondary | ICD-10-CM

## 2020-04-27 DIAGNOSIS — Z419 Encounter for procedure for purposes other than remedying health state, unspecified: Secondary | ICD-10-CM

## 2020-04-27 DIAGNOSIS — F17210 Nicotine dependence, cigarettes, uncomplicated: Secondary | ICD-10-CM | POA: Diagnosis present

## 2020-04-27 DIAGNOSIS — Z981 Arthrodesis status: Secondary | ICD-10-CM | POA: Diagnosis not present

## 2020-04-27 HISTORY — PX: TRANSFORAMINAL LUMBAR INTERBODY FUSION (TLIF) WITH PEDICLE SCREW FIXATION 1 LEVEL: SHX6141

## 2020-04-27 SURGERY — TRANSFORAMINAL LUMBAR INTERBODY FUSION (TLIF) WITH PEDICLE SCREW FIXATION 1 LEVEL
Anesthesia: General

## 2020-04-27 MED ORDER — FENTANYL CITRATE (PF) 250 MCG/5ML IJ SOLN
INTRAMUSCULAR | Status: AC
  Filled 2020-04-27: qty 5

## 2020-04-27 MED ORDER — LIDOCAINE-EPINEPHRINE 1 %-1:100000 IJ SOLN
INTRAMUSCULAR | Status: DC | PRN
  Administered 2020-04-27: 3.5 mL

## 2020-04-27 MED ORDER — FENTANYL CITRATE (PF) 100 MCG/2ML IJ SOLN
25.0000 ug | INTRAMUSCULAR | Status: DC | PRN
  Administered 2020-04-27: 25 ug via INTRAVENOUS

## 2020-04-27 MED ORDER — BUPIVACAINE HCL (PF) 0.5 % IJ SOLN
INTRAMUSCULAR | Status: AC
  Filled 2020-04-27: qty 30

## 2020-04-27 MED ORDER — LACTATED RINGERS IV SOLN: INTRAVENOUS | Status: DC | PRN

## 2020-04-27 MED ORDER — ROCURONIUM BROMIDE 10 MG/ML (PF) SYRINGE
PREFILLED_SYRINGE | INTRAVENOUS | Status: AC
  Filled 2020-04-27: qty 10

## 2020-04-27 MED ORDER — LIDOCAINE-EPINEPHRINE 1 %-1:100000 IJ SOLN
INTRAMUSCULAR | Status: AC
  Filled 2020-04-27: qty 1

## 2020-04-27 MED ORDER — PROPOFOL 10 MG/ML IV BOLUS
INTRAVENOUS | Status: DC | PRN
  Administered 2020-04-27: 150 mg via INTRAVENOUS

## 2020-04-27 MED ORDER — FENTANYL CITRATE (PF) 100 MCG/2ML IJ SOLN
INTRAMUSCULAR | Status: AC
  Filled 2020-04-27: qty 2

## 2020-04-27 MED ORDER — CHLORHEXIDINE GLUCONATE CLOTH 2 % EX PADS: 6.0000 | MEDICATED_PAD | Freq: Once | CUTANEOUS | Status: DC

## 2020-04-27 MED ORDER — METOPROLOL SUCCINATE ER 25 MG PO TB24
25.0000 mg | ORAL_TABLET | Freq: Every day | ORAL | Status: DC
  Administered 2020-04-28: 25 mg via ORAL
  Filled 2020-04-27: qty 1

## 2020-04-27 MED ORDER — CYCLOBENZAPRINE HCL 10 MG PO TABS
10.0000 mg | ORAL_TABLET | Freq: Three times a day (TID) | ORAL | Status: DC | PRN
  Administered 2020-04-27: 10 mg via ORAL

## 2020-04-27 MED ORDER — ONDANSETRON HCL 4 MG/2ML IJ SOLN
INTRAMUSCULAR | Status: DC | PRN
  Administered 2020-04-27: 4 mg via INTRAVENOUS

## 2020-04-27 MED ORDER — CHLORHEXIDINE GLUCONATE 0.12 % MT SOLN
15.0000 mL | Freq: Once | OROMUCOSAL | Status: AC
  Administered 2020-04-27: 15 mL via OROMUCOSAL
  Filled 2020-04-27: qty 15

## 2020-04-27 MED ORDER — BUPIVACAINE LIPOSOME 1.3 % IJ SUSP
INTRAMUSCULAR | Status: AC
  Filled 2020-04-27: qty 20

## 2020-04-27 MED ORDER — HEMOSTATIC AGENTS (NO CHARGE) OPTIME
TOPICAL | Status: DC | PRN
  Administered 2020-04-27: 1 via TOPICAL

## 2020-04-27 MED ORDER — SODIUM CHLORIDE 0.9% FLUSH: 3.0000 mL | INTRAVENOUS | Status: DC | PRN

## 2020-04-27 MED ORDER — ACETAMINOPHEN 10 MG/ML IV SOLN: 1000.0000 mg | Freq: Once | INTRAVENOUS | Status: DC | PRN

## 2020-04-27 MED ORDER — BUPIVACAINE LIPOSOME 1.3 % IJ SUSP
INTRAMUSCULAR | Status: DC | PRN
  Administered 2020-04-27: 20 mL

## 2020-04-27 MED ORDER — ACETAMINOPHEN 325 MG PO TABS: 650.0000 mg | ORAL_TABLET | ORAL | Status: DC | PRN

## 2020-04-27 MED ORDER — LIDOCAINE 2% (20 MG/ML) 5 ML SYRINGE
INTRAMUSCULAR | Status: DC | PRN
  Administered 2020-04-27: 60 mg via INTRAVENOUS

## 2020-04-27 MED ORDER — OXYCODONE-ACETAMINOPHEN 5-325 MG PO TABS
1.0000 | ORAL_TABLET | ORAL | Status: DC | PRN
Start: 2020-04-27 — End: 2020-04-28
  Administered 2020-04-27 – 2020-04-28 (×4): 2 via ORAL
  Filled 2020-04-27 (×4): qty 2

## 2020-04-27 MED ORDER — MIDAZOLAM HCL 2 MG/2ML IJ SOLN
INTRAMUSCULAR | Status: DC | PRN
  Administered 2020-04-27: 2 mg via INTRAVENOUS

## 2020-04-27 MED ORDER — PROPOFOL 10 MG/ML IV BOLUS
INTRAVENOUS | Status: AC
  Filled 2020-04-27: qty 40

## 2020-04-27 MED ORDER — CYCLOBENZAPRINE HCL 10 MG PO TABS
ORAL_TABLET | ORAL | Status: AC
  Filled 2020-04-27: qty 1

## 2020-04-27 MED ORDER — ONDANSETRON HCL 4 MG/2ML IJ SOLN: 4.0000 mg | Freq: Four times a day (QID) | INTRAMUSCULAR | Status: DC | PRN

## 2020-04-27 MED ORDER — AMISULPRIDE (ANTIEMETIC) 5 MG/2ML IV SOLN: 10.0000 mg | Freq: Once | INTRAVENOUS | Status: DC | PRN

## 2020-04-27 MED ORDER — FENTANYL CITRATE (PF) 250 MCG/5ML IJ SOLN
INTRAMUSCULAR | Status: DC | PRN
  Administered 2020-04-27: 150 ug via INTRAVENOUS
  Administered 2020-04-27 (×4): 50 ug via INTRAVENOUS

## 2020-04-27 MED ORDER — SODIUM CHLORIDE 0.9 % IV SOLN
250.0000 mL | INTRAVENOUS | Status: DC
  Administered 2020-04-27: 250 mL via INTRAVENOUS

## 2020-04-27 MED ORDER — ONDANSETRON HCL 4 MG/2ML IJ SOLN
INTRAMUSCULAR | Status: AC
  Filled 2020-04-27: qty 2

## 2020-04-27 MED ORDER — PHENYLEPHRINE HCL-NACL 10-0.9 MG/250ML-% IV SOLN
INTRAVENOUS | Status: DC | PRN
  Administered 2020-04-27: 50 ug/min via INTRAVENOUS

## 2020-04-27 MED ORDER — ORAL CARE MOUTH RINSE: 15.0000 mL | Freq: Once | OROMUCOSAL | Status: AC

## 2020-04-27 MED ORDER — MORPHINE SULFATE (PF) 2 MG/ML IV SOLN: 2.0000 mg | INTRAVENOUS | Status: DC | PRN

## 2020-04-27 MED ORDER — POLYETHYLENE GLYCOL 3350 17 G PO PACK: 17.0000 g | PACK | Freq: Every day | ORAL | Status: DC | PRN

## 2020-04-27 MED ORDER — ENOXAPARIN SODIUM 40 MG/0.4ML ~~LOC~~ SOLN
40.0000 mg | SUBCUTANEOUS | Status: DC
  Administered 2020-04-28: 40 mg via SUBCUTANEOUS
  Filled 2020-04-27: qty 0.4

## 2020-04-27 MED ORDER — DEXAMETHASONE SODIUM PHOSPHATE 10 MG/ML IJ SOLN
INTRAMUSCULAR | Status: AC
  Filled 2020-04-27: qty 1

## 2020-04-27 MED ORDER — NICOTINE 7 MG/24HR TD PT24
7.0000 mg | MEDICATED_PATCH | Freq: Every day | TRANSDERMAL | Status: DC
  Administered 2020-04-27 – 2020-04-28 (×2): 7 mg via TRANSDERMAL
  Filled 2020-04-27 (×2): qty 1

## 2020-04-27 MED ORDER — ONDANSETRON HCL 4 MG/2ML IJ SOLN: 4.0000 mg | Freq: Once | INTRAMUSCULAR | Status: DC | PRN

## 2020-04-27 MED ORDER — SODIUM CHLORIDE 0.9% FLUSH
3.0000 mL | Freq: Two times a day (BID) | INTRAVENOUS | Status: DC
  Administered 2020-04-27 (×2): 3 mL via INTRAVENOUS

## 2020-04-27 MED ORDER — MENTHOL 3 MG MT LOZG: 1.0000 | LOZENGE | OROMUCOSAL | Status: DC | PRN

## 2020-04-27 MED ORDER — ROCURONIUM BROMIDE 10 MG/ML (PF) SYRINGE
PREFILLED_SYRINGE | INTRAVENOUS | Status: DC | PRN
  Administered 2020-04-27: 20 mg via INTRAVENOUS
  Administered 2020-04-27: 40 mg via INTRAVENOUS
  Administered 2020-04-27: 60 mg via INTRAVENOUS

## 2020-04-27 MED ORDER — MIDAZOLAM HCL 2 MG/2ML IJ SOLN
INTRAMUSCULAR | Status: AC
  Filled 2020-04-27: qty 2

## 2020-04-27 MED ORDER — DEXAMETHASONE SODIUM PHOSPHATE 10 MG/ML IJ SOLN
INTRAMUSCULAR | Status: DC | PRN
  Administered 2020-04-27: 5 mg via INTRAVENOUS

## 2020-04-27 MED ORDER — ACETAMINOPHEN 650 MG RE SUPP: 650.0000 mg | RECTAL | Status: DC | PRN

## 2020-04-27 MED ORDER — THROMBIN 5000 UNITS EX SOLR
OROMUCOSAL | Status: DC | PRN
  Administered 2020-04-27: 5 mL via TOPICAL

## 2020-04-27 MED ORDER — LACTATED RINGERS IV SOLN: INTRAVENOUS | Status: DC

## 2020-04-27 MED ORDER — THROMBIN 5000 UNITS EX SOLR
CUTANEOUS | Status: AC
  Filled 2020-04-27: qty 5000

## 2020-04-27 MED ORDER — FLEET ENEMA 7-19 GM/118ML RE ENEM: 1.0000 | ENEMA | Freq: Once | RECTAL | Status: DC | PRN

## 2020-04-27 MED ORDER — POTASSIUM CHLORIDE IN NACL 20-0.9 MEQ/L-% IV SOLN: INTRAVENOUS | Status: DC

## 2020-04-27 MED ORDER — DOCUSATE SODIUM 100 MG PO CAPS
100.0000 mg | ORAL_CAPSULE | Freq: Two times a day (BID) | ORAL | Status: DC
  Administered 2020-04-27 – 2020-04-28 (×3): 100 mg via ORAL
  Filled 2020-04-27 (×3): qty 1

## 2020-04-27 MED ORDER — SUGAMMADEX SODIUM 200 MG/2ML IV SOLN
INTRAVENOUS | Status: DC | PRN
  Administered 2020-04-27 (×2): 100 mg via INTRAVENOUS

## 2020-04-27 MED ORDER — ONDANSETRON HCL 4 MG PO TABS: 4.0000 mg | ORAL_TABLET | Freq: Four times a day (QID) | ORAL | Status: DC | PRN

## 2020-04-27 MED ORDER — CEFAZOLIN SODIUM-DEXTROSE 2-4 GM/100ML-% IV SOLN
2.0000 g | Freq: Three times a day (TID) | INTRAVENOUS | Status: AC
  Administered 2020-04-27 – 2020-04-28 (×3): 2 g via INTRAVENOUS
  Filled 2020-04-27 (×3): qty 100

## 2020-04-27 MED ORDER — ATORVASTATIN CALCIUM 80 MG PO TABS
80.0000 mg | ORAL_TABLET | Freq: Every day | ORAL | Status: DC
  Administered 2020-04-28: 80 mg via ORAL
  Filled 2020-04-27: qty 1

## 2020-04-27 MED ORDER — PROPOFOL 10 MG/ML IV BOLUS
INTRAVENOUS | Status: AC
  Filled 2020-04-27: qty 20

## 2020-04-27 MED ORDER — CEFAZOLIN SODIUM-DEXTROSE 2-4 GM/100ML-% IV SOLN
2.0000 g | INTRAVENOUS | Status: AC
  Administered 2020-04-27: 2 g via INTRAVENOUS
  Filled 2020-04-27: qty 100

## 2020-04-27 MED ORDER — 0.9 % SODIUM CHLORIDE (POUR BTL) OPTIME
TOPICAL | Status: DC | PRN
  Administered 2020-04-27: 1000 mL

## 2020-04-27 MED ORDER — PANTOPRAZOLE SODIUM 40 MG PO TBEC
40.0000 mg | DELAYED_RELEASE_TABLET | Freq: Every day | ORAL | Status: DC
  Administered 2020-04-27 – 2020-04-28 (×2): 40 mg via ORAL
  Filled 2020-04-27 (×2): qty 1

## 2020-04-27 MED ORDER — PHENOL 1.4 % MT LIQD: 1.0000 | OROMUCOSAL | Status: DC | PRN

## 2020-04-27 MED ORDER — BUPIVACAINE HCL (PF) 0.5 % IJ SOLN
INTRAMUSCULAR | Status: DC | PRN
  Administered 2020-04-27: 3.5 mL

## 2020-04-27 SURGICAL SUPPLY — 73 items
ADH SKN CLS APL DERMABOND .7 (GAUZE/BANDAGES/DRESSINGS) ×1
BAND INSRT 18 STRL LF DISP RB (MISCELLANEOUS) ×2
BAND RUBBER #18 3X1/16 STRL (MISCELLANEOUS) ×4 IMPLANT
BASKET BONE COLLECTION (BASKET) ×2 IMPLANT
BLADE CLIPPER SURG (BLADE) ×2 IMPLANT
BUR CARBIDE MATCH 3.0 (BURR) IMPLANT
BUR MATCHSTICK NEURO 3.0 LAGG (BURR) ×2 IMPLANT
BUR PRECISION FLUTE 5.0 (BURR) IMPLANT
BUR PRECISION MATCH 2.5 (BURR) IMPLANT
CAGE SABLE 10X26 15D (Cage) ×2 IMPLANT
CANISTER SUCT 3000ML PPV (MISCELLANEOUS) ×2 IMPLANT
CAP LOCKING THREADED (Cap) ×8 IMPLANT
CNTNR URN SCR LID CUP LEK RST (MISCELLANEOUS) ×1 IMPLANT
CONT SPEC 4OZ STRL OR WHT (MISCELLANEOUS) ×2
COVER BACK TABLE 60X90IN (DRAPES) ×2 IMPLANT
COVER WAND RF STERILE (DRAPES) ×4 IMPLANT
DECANTER SPIKE VIAL GLASS SM (MISCELLANEOUS) ×2 IMPLANT
DERMABOND ADVANCED (GAUZE/BANDAGES/DRESSINGS) ×1
DERMABOND ADVANCED .7 DNX12 (GAUZE/BANDAGES/DRESSINGS) ×1 IMPLANT
DRAIN JACKSON PRATT 10MM FLAT (MISCELLANEOUS) ×2 IMPLANT
DRAPE 3/4 80X56 (DRAPES) ×2 IMPLANT
DRAPE C-ARM 42X72 X-RAY (DRAPES) ×2 IMPLANT
DRAPE C-ARMOR (DRAPES) ×2 IMPLANT
DRAPE LAPAROTOMY 100X72X124 (DRAPES) ×2 IMPLANT
DRAPE MICROSCOPE LEICA (MISCELLANEOUS) ×2 IMPLANT
DRSG OPSITE POSTOP 4X6 (GAUZE/BANDAGES/DRESSINGS) IMPLANT
DRSG OPSITE POSTOP 4X8 (GAUZE/BANDAGES/DRESSINGS) ×2 IMPLANT
DURAPREP 26ML APPLICATOR (WOUND CARE) ×2 IMPLANT
ELECT BLADE INSULATED 6.5IN (ELECTROSURGICAL) ×2
ELECT REM PT RETURN 9FT ADLT (ELECTROSURGICAL) ×2
ELECTRODE BLDE INSULATED 6.5IN (ELECTROSURGICAL) ×1 IMPLANT
ELECTRODE REM PT RTRN 9FT ADLT (ELECTROSURGICAL) ×1 IMPLANT
EVACUATOR SILICONE 100CC (DRAIN) ×2 IMPLANT
GAUZE 4X4 16PLY RFD (DISPOSABLE) IMPLANT
GAUZE SPONGE 4X4 12PLY STRL (GAUZE/BANDAGES/DRESSINGS) IMPLANT
GLOVE BIOGEL PI IND STRL 7.5 (GLOVE) ×1 IMPLANT
GLOVE BIOGEL PI INDICATOR 7.5 (GLOVE) ×1
GLOVE ECLIPSE 7.5 STRL STRAW (GLOVE) ×2 IMPLANT
GLOVE EXAM NITRILE XL STR (GLOVE) IMPLANT
GOWN STRL REUS W/ TWL LRG LVL3 (GOWN DISPOSABLE) ×1 IMPLANT
GOWN STRL REUS W/ TWL XL LVL3 (GOWN DISPOSABLE) ×1 IMPLANT
GOWN STRL REUS W/TWL 2XL LVL3 (GOWN DISPOSABLE) IMPLANT
GOWN STRL REUS W/TWL LRG LVL3 (GOWN DISPOSABLE) ×2
GOWN STRL REUS W/TWL XL LVL3 (GOWN DISPOSABLE) ×2
GRAFT TRINITY ELITE LGE HUMAN (Tissue) ×2 IMPLANT
HEMOSTAT POWDER KIT SURGIFOAM (HEMOSTASIS) ×2 IMPLANT
KIT BASIN OR (CUSTOM PROCEDURE TRAY) ×2 IMPLANT
KIT INFUSE XX SMALL 0.7CC (Orthopedic Implant) ×2 IMPLANT
KIT TURNOVER KIT B (KITS) ×2 IMPLANT
MARKER SKIN DUAL TIP RULER LAB (MISCELLANEOUS) ×2 IMPLANT
MILL MEDIUM DISP (BLADE) ×2 IMPLANT
NEEDLE HYPO 18GX1.5 BLUNT FILL (NEEDLE) IMPLANT
NEEDLE HYPO 21X1.5 SAFETY (NEEDLE) ×2 IMPLANT
NEEDLE HYPO 22GX1.5 SAFETY (NEEDLE) ×2 IMPLANT
NEEDLE SPNL 18GX3.5 QUINCKE PK (NEEDLE) IMPLANT
NS IRRIG 1000ML POUR BTL (IV SOLUTION) ×4 IMPLANT
PACK LAMINECTOMY NEURO (CUSTOM PROCEDURE TRAY) ×2 IMPLANT
PAD ARMBOARD 7.5X6 YLW CONV (MISCELLANEOUS) ×6 IMPLANT
ROD 40MM SPINAL (Rod) ×2 IMPLANT
ROD CREO 45MM SPINAL (Rod) ×2 IMPLANT
SCREW 6.5X45 (Screw) ×4 IMPLANT
SCREW SPINAL CREO 7.5X40 (Screw) ×4 IMPLANT
SPONGE LAP 4X18 RFD (DISPOSABLE) IMPLANT
SPONGE SURGIFOAM ABS GEL 100 (HEMOSTASIS) IMPLANT
SUT MNCRL AB 4-0 PS2 18 (SUTURE) ×2 IMPLANT
SUT VIC AB 0 CT1 18XCR BRD8 (SUTURE) ×2 IMPLANT
SUT VIC AB 0 CT1 8-18 (SUTURE) ×4
SUT VIC AB 2-0 CP2 18 (SUTURE) ×4 IMPLANT
SYR 30ML LL (SYRINGE) ×4 IMPLANT
TOWEL GREEN STERILE (TOWEL DISPOSABLE) ×2 IMPLANT
TOWEL GREEN STERILE FF (TOWEL DISPOSABLE) ×2 IMPLANT
TRAY FOLEY MTR SLVR 16FR STAT (SET/KITS/TRAYS/PACK) ×2 IMPLANT
WATER STERILE IRR 1000ML POUR (IV SOLUTION) ×2 IMPLANT

## 2020-04-27 NOTE — Op Note (Signed)
Procedure(s): Transforaminal Lumbar Interbody Fusion Lumbar Five-Sacral One Procedure Note  Darryl Ward male 75 y.o. 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  Physical therapy and injections did not improve his pain.  After long discussion, I discussed with him the option of a decompression with fusion given his severe arthropathy and significant back pain.  Risks, benefits, alternatives, expected convalescence were discussed with him.  Risks discussed included, but were not limited to, bleeding, pain, infection, scar, spinal fluid leak, neurologic deficit, pseudoarthrosis, adjacent segment disease, and death.  He wished to proceed with surgery.  Informed consent was obtained.   Surgeon: Vallarie Mare   Assistants: N. Kathyrn Sheriff, MD. please note, there were no qualified trainees available to assist with the procedure.  Dr. Kathyrn Sheriff assisted with placement of contralateral instrumentation.  Anesthesia: General endotracheal anesthesia  Procedure Detail  1. L5-S1 Lumbar posterior interbody fusion via transforaminal approach, including facetectomy, foraminotomy 2. Posterolateral arthrodesis L5-S1 3. Laminectomy, bilateral facetectomy, and foraminotomies L5-S1 4. nonsegmental instrumentation with pedicle screw and rod contructs L5-S1 5.  Harvest of local autograft 6.  Use of morselized allograft  Patient was brought to the operating room.  After appropriate lines and monitors were placed, general anesthesia was induced and patient was intubated by the anesthesia service.  Patient was positioned prone on  a Wilson frame with all pressure points padded and eyes protected.  His lower back was preprepped with alcohol prepped and draped in sterile fashion.  1% lidocaine with epinephrine was injected in the skin.  A timeout was performed.  Preoperative antibiotics was administered.  Incision was made with a 10 blade and monopolar electrocautery was used to incise the fascia and dissected the paraspinous muscles off of the L5 and S1 lamina in subperiosteal fashion.  Self-retaining retractor was used.  X-ray was used to perform localization.  The inferior portion of the L5 lamina and superior portion of S1 was removed with high-speed drill and rongeurs, with autograft harvested for arthrodesis.  Bilateral facetectomies were performed.  Generous foraminotomies were performed.  Good decompression was confirmed with easy passage of ball ended probe.  Attention was then turned to the disc space on the right side where the exiting nerve root was retracted superiorly and the traversing root was retracted medially to expose the disc space.  Epidural veins were coagulated and cut.  Annulotomy was made with a 15 blade.  Discectomy was performed with rongeurs, disc shavers and the endplates were prepared with ringed and sharp toothed curettes, including angled instruments.  The interbody space was then packed with xxs BMP and autograft along with morselized allograft.  A 10 mm expandable cage was then tamped into place under C-arm guidance and expanded under C arm guidance to an appropriate fit.  AP x-ray confirmed good positioning.  The cage was then backfilled with autograft and allograft.  Meticulous epidural hemostasis was obtained.  Pilot holes were then drilled using anatomic landmarks at L5 and S1, at the junction of the transverse process and the superior articulating process.  Pedicles were then cannulated with a gearshift pedicle finder, with x-ray guidance.  [employer]  ended feeler confirmed good cannulation of the pedicle.  The  pedicle was then tapped, and appropriately sized screw was then placed at L5 and S1 bilaterally with good purchase.  AP and lateral C-arm x-ray confirmed good positioning of the screws.  The transverse processes and ala were decorticated bilaterally.  Meticulous epidural hemostasis was obtained.  40 and 45 mm rods were then placed in the screw heads and final tightened.  Remaining autograft and allograft was placed in the lateral gutters bilaterally.  A 10 flat JP drain was placed in the subfascial space and tunneled out the skin.  The wound was irrigated thoroughly.  The patient's dura.  Fairly thin so a piece of Gelfoam was placed in the epidural space to reinforce it.  The muscle layer was closed with 0 Vicryl stitches.  The fascial layer was closed with 0 Vicryl stitches.  The dermal layer was closed with 2-0 Vicryl stitches in buried interrupted fashion.  The skin was closed with 4-0 Monocryl in subcuticular manner.  Dermabond and a sterile dressing were then placed.  Patient was then flipped supine and extubated by the anesthesia service.  All counts were correct at the end of surgery.  No complications were noted.    Findings: Severe foraminal stenosis.  Successful interbody and posterior lateral fusion  Estimated Blood Loss:  less than 100 mL         Drains: JACKSON-PRATT (JP)         Specimens: None         Implants:  Globus Sable cage 10-48mm, 10x 26 mm 15 degree lordosis Creo pedicle screws 6.5 x 45 mm screws at L5 7.5 x 40 mm screws at S1 40 mm/45 mm rods        Complications:  * No complications entered in OR log *         Disposition: PACU - hemodynamically stable.         Condition: stable

## 2020-04-27 NOTE — Progress Notes (Signed)
Orthopedic Tech Progress Note Patient Details:  Darryl Ward 01-28-1945 [id number]  Ortho Devices Type of Ortho Device: Lumbar corsett Ortho Device/Splint Location: BACK Ortho Device/Splint Interventions: Ordered   Post Interventions Patient Tolerated: Other (comment) Instructions Provided: Other (comment)   [name] 04/27/2020, 5:54 PM

## 2020-04-27 NOTE — Anesthesia Preprocedure Evaluation (Addendum)
Anesthesia Evaluation  Patient identified by MRN, date of birth, ID band Patient awake    Reviewed: Allergy & Precautions, NPO status , Patient's Chart, lab work & pertinent test results  Airway Mallampati: II  TM Distance: >3 FB Neck ROM: Full    Dental  (+) Upper Dentures, Lower Dentures   Pulmonary Current SmokerPatient did not abstain from smoking.,    Pulmonary exam normal breath sounds clear to auscultation       Cardiovascular hypertension, Pt. on home beta blockers + CAD, + Past MI and + Peripheral Vascular Disease  Normal cardiovascular exam Rhythm:Regular Rate:Normal  ECG: SR  ECHO: 1. Left ventricular ejection fraction, by estimation, is 60 to 65%. The left ventricle has normal function. The left ventricle has no regional wall motion abnormalities. Left ventricular diastolic parameters were normal. 2. Right ventricular systolic function is normal. The right ventricular size is normal. 3. Left atrial size was moderately dilated. 4. The mitral valve is normal in structure. No evidence of mitral valve regurgitation. No evidence of mitral stenosis. 5. The aortic valve is tricuspid. Aortic valve regurgitation is not visualized. No aortic stenosis is present. 6. The inferior vena cava is normal in size with greater than 50% respiratory variability, suggesting right atrial pressure of 3 mmHg.   Neuro/Psych TIACVA, No Residual Symptoms negative psych ROS   GI/Hepatic negative GI ROS, Neg liver ROS,   Endo/Other  negative endocrine ROS  Renal/GU negative Renal ROS     Musculoskeletal  (+) Arthritis ,   Abdominal   Peds  Hematology HLD   Anesthesia Other Findings RADICULOPATHY, LUMBAR REGION  Reproductive/Obstetrics                            Anesthesia Physical Anesthesia Plan  ASA: III  Anesthesia Plan: General   Post-op Pain Management:    Induction: Intravenous  PONV Risk  Score and Plan: 1 and Ondansetron, Dexamethasone, Midazolam and Treatment may vary due to age or medical condition  Airway Management Planned: Oral ETT  Additional Equipment:   Intra-op Plan:   Post-operative Plan: Extubation in OR  Informed Consent: I have reviewed the patients History and Physical, chart, labs and discussed the procedure including the risks, benefits and alternatives for the proposed anesthesia with the patient or authorized representative who has indicated his/her understanding and acceptance.       Plan Discussed with: CRNA  Anesthesia Plan Comments: (Potential arterial line placement discussed with patient in pre-op)       Anesthesia Quick Evaluation

## 2020-04-27 NOTE — Anesthesia Postprocedure Evaluation (Signed)
Anesthesia Post Note  Patient: Darryl Ward  Procedure(s) Performed: Transforaminal Lumbar Interbody Fusion Lumbar Five-Sacral One (N/A )     Patient location during evaluation: PACU Anesthesia Type: General Level of consciousness: awake Pain management: pain level controlled Vital Signs Assessment: post-procedure vital signs reviewed and stable Respiratory status: spontaneous breathing, nonlabored ventilation, respiratory function stable and patient connected to nasal cannula oxygen Cardiovascular status: blood pressure returned to baseline and stable Postop Assessment: no apparent nausea or vomiting Anesthetic complications: no   No complications documented.  Last Vitals:  Vitals:   04/27/20 1310 04/27/20 1404  BP: 135/71 (!) 144/85  Pulse: 60 62  Resp: 11 20  Temp:    SpO2: 95% 99%    Last Pain:  Vitals:   04/27/20 1255  TempSrc:   PainSc: 5                  Sonya Pucci P Geneveive [place]

## 2020-04-27 NOTE — Anesthesia Procedure Notes (Signed)
Procedure Name: Intubation Date/Time: 04/27/2020 7:55 AM Performed by: Rande Brunt, CRNA Pre-anesthesia Checklist: Patient identified, Emergency Drugs available, Suction available and Patient being monitored Patient Re-evaluated:Patient Re-evaluated prior to induction Oxygen Delivery Method: Circle System Utilized Preoxygenation: Pre-oxygenation with 100% oxygen Induction Type: IV induction Ventilation: Mask ventilation without difficulty Laryngoscope Size: Mac and 4 Grade View: Grade I Tube type: Oral Tube size: 7.5 mm Number of attempts: 1 Airway Equipment and Method: Stylet and Oral airway Placement Confirmation: ETT inserted through vocal cords under direct vision,  positive ETCO2 and breath sounds checked- equal and bilateral Secured at: 21 cm Tube secured with: Tape Dental Injury: Teeth and Oropharynx as per pre-operative assessment

## 2020-04-27 NOTE — Transfer of Care (Signed)
Immediate Anesthesia Transfer of Care Note  Patient: Darryl Ward  Procedure(s) Performed: Transforaminal Lumbar Interbody Fusion Lumbar Five-Sacral One (N/A )  Patient Location: PACU  Anesthesia Type:General  Level of Consciousness: awake, alert  and oriented  Airway & Oxygen Therapy: Patient Spontanous Breathing and Patient connected to face mask oxygen  Post-op Assessment: Report given to RN, Post -op Vital signs reviewed and stable and Patient moving all extremities  Post vital signs: Reviewed and stable  Last Vitals:  Vitals Value Taken Time  BP 143/87 04/27/20 1201  Temp    Pulse 82 04/27/20 1204  Resp 19 04/27/20 1204  SpO2 100 % 04/27/20 1204  Vitals shown include unvalidated device data.  Last Pain:  Vitals:   04/27/20 0633  TempSrc:   PainSc: 5          Complications: No complications documented.

## 2020-04-27 NOTE — H&P (Signed)
CC: back and leg pain  HPI:     Patient is a 75 y.o. male who presented to the office with severe back pain and R>L radiculopathy.  He had severe foraminal stenosis bilaterally at L5-S1 with accompanying severe facet arthropathy and broad disc bulge.  Injections and PT did not improve his pain.  He is here for elective TLIF.    Patient Active Problem List   Diagnosis Date Noted  . Hospital discharge follow-up 02/15/2020  . Lumbar spinal stenosis 02/06/2020  . Cerebral thrombosis with cerebral infarction 02/05/2020  . Meningioma, cerebral ([place]) 02/05/2020  . 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 Right hemiparesis ([place]) 02/04/2020  . Chronic midline low back pain without sciatica 04/27/2019  . Tobacco abuse 11/17/2018  . PVD (peripheral vascular disease) ([place]) 05/21/2012  . Atherosclerosis of native arteries of the extremities with ulceration ([place]) 01/23/2012  . Mixed hyperlipidemia 12/13/2011  . Coronary atherosclerosis of native coronary artery 12/13/2011  . Aneurysm of abdominal vessel ([place]) 12/12/2011   Past Medical History:  Diagnosis Date  . AAA (abdominal aortic aneurysm) ([place])   . Arthritis   . Carotid artery disease ([facility])    Nonobstructive  . Cataract   . Coronary atherosclerosis of native coronary artery    PTCA small diagonal 2007 otherwise nonobstructive CAD, EF 50-55%  . Essential hypertension, benign   . Hyperlipidemia   . NSTEMI (non-ST elevated myocardial infarction) ([place]) 2007  . Stroke [facility]) 2004  . TIA (transient ischemic attack) 2006    Past Surgical History:  Procedure Laterality Date  . AORTA - BILATERAL FEMORAL ARTERY BYPASS GRAFT  01/08/2012   Procedure: AORTA BIFEMORAL BYPASS GRAFT;  Surgeon: Elam Dutch, MD;  Location: [facility];  Service: Vascular;  Laterality: Bilateral;  using 18x18mm x 40cm Hemashield Gold Vascular Graft with Endarterectomy, Thombectomy and  Reimplantation of Inferior Mesenteric Artery  . COLONOSCOPY N/A 04/14/2019   Procedure:  COLONOSCOPY;  Surgeon: Daneil Dolin, MD;  Location: [facility] ENDO SUITE;  Service: Endoscopy;  Laterality: N/A;  9:30  . Left cataract surgery    . POLYPECTOMY  04/14/2019   Procedure: POLYPECTOMY;  Surgeon: Daneil Dolin, MD;  Location: [facility] ENDO SUITE;  Service: Endoscopy;;    Medications Prior to Admission  Medication Sig Dispense Refill Last Dose  . aspirin EC 81 MG tablet Take 1 tablet (81 mg total) by mouth daily. 90 tablet 3 04/21/2020  . atorvastatin (LIPITOR) 80 MG tablet Take 1 tablet (80 mg total) by mouth daily. 90 tablet 3 04/27/2020 at 0400  . celecoxib (CELEBREX) 200 MG capsule Take 1 capsule (200 mg total) by mouth 2 (two) times daily. 180 capsule 3 04/26/2020 at Unknown time  . metoprolol succinate (TOPROL-XL) 25 MG 24 hr tablet Take 1 tablet (25 mg total) by mouth daily. 90 tablet 3 04/27/2020 at 0400   No Known Allergies  Social History   Tobacco Use  . Smoking status: Current Every Day Smoker    Packs/day: 1.00    Years: 40.00    Pack years: 40.00    Types: Cigarettes  . Smokeless tobacco: Never Used  Substance Use Topics  . Alcohol use: No    Comment: Prior history of regular alcohol use    Family History  Problem Relation Age of Onset  . Hyperlipidemia Sister   . Heart attack Brother 61     Review of Systems Pertinent items noted in HPI and remainder of comprehensive ROS otherwise negative.  Objective:   Patient Vitals for  the past 8 hrs:  BP Temp Temp src Pulse Resp SpO2 Height Weight  04/27/20 0603 (!) 124/56 97.6 F (36.4 C) Oral 66 18 97 % 5\' 10"  (1.778 m) 81.2 kg   No intake/output data recorded. No intake/output data recorded.      General : Alert, cooperative, no distress, appears stated age   Head:  Normocephalic/atraumatic    Eyes: PERRL, conjunctiva/corneas clear, EOM's intact. Fundi could not be visualized Neck: Supple Chest:  Respirations unlabored Chest wall: no tenderness or deformity Heart: Regular rate and rhythm Abdomen: Soft,  nontender and nondistended Extremities: warm and well-perfused Skin: normal turgor, color and texture Neurologic:  Alert, oriented x 3.  Eyes open spontaneously. PERRL, EOMI, VFC, no facial droop. V1-3 intact.  No dysarthria, tongue protrusion symmetric.  CNII-XII intact. Normal strength, sensation and reflexes throughout.  No pronator drift, full strength in legs       Data Review  See HPI  Assessment:  Severe L5-S1 stenosis and degenerative disease with associated radiculopathy and back pain  Plan:   -L5-S1 TLIF

## 2020-04-28 ENCOUNTER — Encounter (HOSPITAL_COMMUNITY): Payer: Self-pay | Admitting: Neurosurgery

## 2020-04-28 MED ORDER — DOCUSATE SODIUM 100 MG PO CAPS: 100.0000 mg | ORAL_CAPSULE | Freq: Two times a day (BID) | ORAL | 2 refills | Status: DC

## 2020-04-28 MED ORDER — CYCLOBENZAPRINE HCL 10 MG PO TABS: 10.0000 mg | ORAL_TABLET | Freq: Three times a day (TID) | ORAL | 0 refills | Status: DC | PRN

## 2020-04-28 MED ORDER — OXYCODONE-ACETAMINOPHEN 5-325 MG PO TABS: 1.0000 | ORAL_TABLET | ORAL | 0 refills | Status: DC | PRN

## 2020-04-28 NOTE — Discharge Instructions (Signed)
Wound Care Remove outer dressing in 2-3 days Leave incision open to air. You may shower. Do not scrub directly on incision.  Do not put any creams, lotions, or ointments on incision. Activity Walk each and every day, increasing distance each day. No lifting greater than 5 lbs.  Avoid bending, arching, and twisting. No driving for 2 weeks; may ride as a passenger locally. If provided with back brace, wear when out of bed.  It is not necessary to wear in bed. Diet Resume your normal diet.  Return to Work Will be discussed at you follow up appointment. Call Your Doctor If Any of These Occur Redness, drainage, or swelling at the wound.  Temperature greater than 101 degrees. Severe pain not relieved by pain medication. Incision starts to come apart. Follow Up Appt Call today for appointment in 2 weeks ([phone]) or for problems.  If you have any hardware placed in your spine, you will need an x-ray before your appointment.

## 2020-04-28 NOTE — Evaluation (Signed)
Physical Therapy Evaluation Patient Details Name: Darryl Ward MRN: [id number] DOB: April 05, 1945 Today's Date: 04/28/2020   History of Present Illness  Pt is a 75 y/o male who presents s/p L5-S1 TLIF on 04/27/2020. PMH significant for CVA 2004, NSTEMI 2007, HTN, CAD, AAA s/p bilateral femoral artery bypass graft.    Clinical Impression  Pt admitted with above diagnosis. At the time of PT eval, pt was able to demonstrate transfers and ambulation with gross mini guard assist to supervision for safety and RW for support. Pt was educated on precautions, brace application/wearing schedule, appropriate activity progression, and car transfer. Could use reinforcement of precautions as he demonstrated an overall poor recall throughout session. Pt currently with functional limitations due to the deficits listed below (see PT Problem List). Pt will benefit from skilled PT to increase their independence and safety with mobility to allow discharge to the venue listed below.      Follow Up Recommendations No PT follow up;Supervision for mobility/OOB    Equipment Recommendations  None recommended by PT    Recommendations for Other Services       Precautions / Restrictions Precautions Precautions: Fall;Back Precaution Booklet Issued: Yes (comment) Precaution Comments: Reviewed handout and pt was cued for precautions during functional mobility. Required Braces or Orthoses: Spinal Brace Spinal Brace: Lumbar corset;Applied in sitting position Restrictions Weight Bearing Restrictions: No      Mobility  Bed Mobility Overal bed mobility: Needs Assistance Bed Mobility: Rolling;Sidelying to Sit;Sit to Sidelying Rolling: Supervision Sidelying to sit: Supervision     Sit to sidelying: Supervision General bed mobility comments: Overall supervision for safety however poor recall of log roll maintaining precautions.    Transfers Overall transfer level: Needs assistance Equipment used: Rolling walker (2  wheeled) Transfers: Sit to/from Stand Sit to Stand: Min guard         General transfer comment: VC's for hand placement on seated surface for safety and improved posture. No assist to power-up to stand.  Ambulation/Gait Ambulation/Gait assistance: Min guard Gait Distance (Feet): 200 Feet Assistive device: Rolling walker (2 wheeled) Gait Pattern/deviations: Step-through pattern;Decreased stride length;Trunk flexed Gait velocity: Decreased Gait velocity interpretation: <1.31 ft/sec, indicative of household ambulator General Gait Details: Frequent VC's for improved posture, closer walker proximity, and forward gaze. Overall moving slow but steady.  Stairs Stairs: Yes Stairs assistance: Min assist Stair Management: One rail Right;Step to pattern;Forwards Number of Stairs: 2 General stair comments: VC's for sequencing and general safety. HHA provided and min assist for power-up and controlled descent for each step.  Wheelchair Mobility    Modified Rankin (Stroke Patients Only)       Balance Overall balance assessment: Needs assistance Sitting-balance support: Feet supported;No upper extremity supported Sitting balance-Leahy Scale: Fair     Standing balance support: No upper extremity supported;During functional activity Standing balance-Leahy Scale: Fair Standing balance comment: No overt LOB however appeares unsteady and unsafe with UE support.                             Pertinent Vitals/Pain Pain Assessment: Faces Faces Pain Scale: Hurts even more Pain Location: back Pain Descriptors / Indicators: Operative site guarding;Aching Pain Intervention(s): Monitored during session;Limited activity within patient's tolerance;Repositioned    Home Living Family/patient expects to be discharged to:: Private residence Living Arrangements: Spouse/significant other (roommate/girlfriend) Available Help at Discharge: Available 24 hours/day Type of Home: House Home  Access: Stairs to enter Entrance Stairs-Rails: Right Entrance Stairs-Number of Steps:  2 Home Layout: One level Home Equipment: Walker - 2 wheels;Cane - single point;Bedside commode      Prior Function Level of Independence: Independent with assistive device(s)         Comments: Pt reports he was using a SPC PTA and had to recently change to using the walk-in shower as he was having difficulty getting in the tub to shower.     Hand Dominance   Dominant Hand: Right    Extremity/Trunk Assessment   Upper Extremity Assessment Upper Extremity Assessment: Defer to OT evaluation    Lower Extremity Assessment Lower Extremity Assessment: Generalized weakness (Consistent with pre-op diagnosis)    Cervical / Trunk Assessment Cervical / Trunk Assessment: Other exceptions Cervical / Trunk Exceptions: s.p surgery  Communication   Communication: HOH  Cognition Arousal/Alertness: Awake/alert Behavior During Therapy: WFL for tasks assessed/performed Overall Cognitive Status: Impaired/Different from baseline Area of Impairment: Safety/judgement;Problem solving                         Safety/Judgement: Decreased awareness of safety   Problem Solving: Slow processing;Requires verbal cues General Comments: Pt with difficulty problem solving the brace. Slow processing at times with following commands or adjusting his movement to maintain precautions.      General Comments      Exercises     Assessment/Plan    PT Assessment Patient needs continued PT services  PT Problem List Decreased strength;Decreased activity tolerance;Decreased balance;Decreased mobility;Decreased safety awareness;Decreased knowledge of use of DME;Decreased knowledge of precautions;Pain       PT Treatment Interventions DME instruction;Gait training;Functional mobility training;Stair training;Therapeutic activities;Therapeutic exercise;Neuromuscular re-education;Patient/family education;Cognitive  remediation    PT Goals (Current goals can be found in the Care Plan section)  Acute Rehab PT Goals Patient Stated Goal: Home today PT Goal Formulation: With patient Time For Goal Achievement: 05/05/20 Potential to Achieve Goals: Good    Frequency Min 5X/week   Barriers to discharge        Co-evaluation               AM-PAC PT "6 Clicks" Mobility  Outcome Measure Help needed turning from your back to your side while in a flat bed without using bedrails?: None Help needed moving from lying on your back to sitting on the side of a flat bed without using bedrails?: A Little Help needed moving to and from a bed to a chair (including a wheelchair)?: A Little Help needed standing up from a chair using your arms (e.g., wheelchair or bedside chair)?: A Little Help needed to walk in hospital room?: A Little Help needed climbing 3-5 steps with a railing? : A Little 6 Click Score: 19    End of Session Equipment Utilized During Treatment: Gait belt;Back brace Activity Tolerance: Patient tolerated treatment well Patient left: in bed;with call bell/phone within reach Nurse Communication: Mobility status PT Visit Diagnosis: Unsteadiness on feet (R26.81);Pain Pain - part of body:  (back)    Time: 5053-9767 PT Time Calculation (min) (ACUTE ONLY): 23 min   Charges:   PT Evaluation $PT Eval Low Complexity: 1 Low PT Treatments $Gait Training: 8-22 mins        Rolinda Roan, PT, DPT Acute Rehabilitation Services Pager: [phone] Office: [phone]   Thelma Comp 04/28/2020, 9:17 AM

## 2020-04-28 NOTE — Discharge Summary (Signed)
  Physician Discharge Summary  Patient ID: Darryl Ward MRN: [id number] DOB/AGE: 1945/12/15 75 y.o.  Admit date: 04/27/2020 Discharge date: 04/28/2020  Admission Diagnoses:  Lumbar radiculopathy  Discharge Diagnoses:  Same Active Problems:   Lumbar radiculopathy   Discharged Condition: Stable  Hospital Course:  JAILAN TRIMM is a 75 y.o. male with bilateral L5 radiculopathy with severe degenerative disease at L5-S1.  He undwerwent elective L5-S1 TLIF.  Postop he was admitted to the spine unit where he was mobilized, tolerating a diet, and his  Pain was controlled.  His drain was removed. He was ready for discharge on 4/8.   Treatments: Surgery - L5-S1 TLIF  Discharge Exam: Blood pressure (!) 129/59, pulse 62, temperature 97.7 F (36.5 C), temperature source Oral, resp. rate 16, height 5\' 10"  (1.778 m), weight 81.2 kg, SpO2 97 %. Awake, alert, oriented Speech fluent, appropriate CN grossly intact 5/5 BUE/BLE Wound c/d/i  Disposition: Discharge disposition: 01-Home or Self Care       Discharge Instructions    Incentive spirometry RT   Complete by: As directed      Allergies as of 04/28/2020   No Known Allergies     Medication List    STOP taking these medications   celecoxib 200 MG capsule Commonly known as: CELEBREX     TAKE these medications   aspirin EC 81 MG tablet Take 1 tablet (81 mg total) by mouth daily.   atorvastatin 80 MG tablet Commonly known as: LIPITOR Take 1 tablet (80 mg total) by mouth daily.   cyclobenzaprine 10 MG tablet Commonly known as: FLEXERIL Take 1 tablet (10 mg total) by mouth 3 (three) times daily as needed for muscle spasms.   docusate sodium 100 MG capsule Commonly known as: COLACE Take 1 capsule (100 mg total) by mouth 2 (two) times daily.   metoprolol succinate 25 MG 24 hr tablet Commonly known as: TOPROL-XL Take 1 tablet (25 mg total) by mouth daily.   oxyCODONE-acetaminophen 5-325 MG tablet Commonly known as:  PERCOCET/ROXICET Take 1-2 tablets by mouth every 4 (four) hours as needed for moderate pain or severe pain.        Signed: Vallarie Mare 04/28/2020, 9:51 AM

## 2020-04-28 NOTE — Evaluation (Signed)
Occupational Therapy Evaluation Patient Details Name: Darryl Ward MRN: [id number] DOB: [birth year] Today's Date: 04/28/2020    History of Present Illness Pt is a 75 y/o male who presents s/p L5-S1 TLIF on 04/27/2020. PMH significant for CVA 2004, NSTEMI 2007, HTN, CAD, AAA s/p bilateral femoral artery bypass graft.   Clinical Impression   Patient admitted for the above diagnosis and procedure.  PTA he was independent with all obility and self care.  He owns a Teaching laboratory technician.  His SO will be available to assist as needed.  Currently he is at or near his baseline for self care from a sit/stand level.  No further acute or post acute OT needed.  All questions answered.      Follow Up Recommendations  No OT follow up    Equipment Recommendations  None recommended by OT    Recommendations for Other Services       Precautions / Restrictions Precautions Precautions: Fall;Back Precaution Booklet Issued: Yes (comment) Precaution Comments: Reviewed handout and pt was cued for precautions during functional mobility. Required Braces or Orthoses: Spinal Brace Spinal Brace: Lumbar corset;Applied in sitting position Restrictions Weight Bearing Restrictions: No      Mobility Bed Mobility Overal bed mobility: Modified Independent Bed Mobility: Rolling;Sidelying to Sit;Sit to Sidelying Rolling: Supervision Sidelying to sit: Supervision     Sit to sidelying: Supervision General bed mobility comments: Overall supervision for safety however poor recall of log roll maintaining precautions.    Transfers Overall transfer level: Needs assistance Equipment used: Rolling walker (2 wheeled) Transfers: Sit to/from Stand Sit to Stand: Modified independent (Device/Increase time)         General transfer comment: VC's for hand placement on seated surface for safety and improved posture. No assist to power-up to stand.    Balance Overall balance assessment: Mild deficits observed, not  formally tested Sitting-balance support: Feet supported;No upper extremity supported Sitting balance-Leahy Scale: Fair     Standing balance support: No upper extremity supported;During functional activity Standing balance-Leahy Scale: Fair Standing balance comment: No overt LOB however appeares unsteady and unsafe with UE support.                           ADL either performed or assessed with clinical judgement   ADL Overall ADL's : At baseline                                             Vision Baseline Vision/History: No visual deficits Patient Visual Report: No change from baseline       Perception     Praxis      Pertinent Vitals/Pain Pain Assessment: Faces Faces Pain Scale: Hurts little more Pain Location: back Pain Descriptors / Indicators: Operative site guarding;Aching Pain Intervention(s): Monitored during session     Hand Dominance Right   Extremity/Trunk Assessment Upper Extremity Assessment Upper Extremity Assessment: Overall WFL for tasks assessed   Lower Extremity Assessment Lower Extremity Assessment: Defer to PT evaluation   Cervical / Trunk Assessment Cervical / Trunk Assessment: Other exceptions Cervical / Trunk Exceptions: s.p surgery   Communication Communication Communication: HOH   Cognition Arousal/Alertness: Awake/alert Behavior During Therapy: WFL for tasks assessed/performed Overall Cognitive Status: Within Functional Limits for tasks assessed Area of Impairment: Safety/judgement;Problem solving  General Comments       Exercises     Shoulder Instructions      Home Living Family/patient expects to be discharged to:: Private residence Living Arrangements: Spouse/significant other Available Help at Discharge: Available 24 hours/day Type of Home: House Home Access: Stairs to enter [employer] of Steps: 2 Entrance Stairs-Rails: Right Home Layout:  One level     Bathroom Shower/Tub: Walk-in shower;Tub/shower unit   Bathroom Toilet: Standard     Home Equipment: Environmental consultant - 2 wheels;Cane - single point;Bedside commode          Prior Functioning/Environment Level of Independence: Independent with assistive device(s)        Comments: Pt reports he was using a SPC PTA and had to recently change to using the walk-in shower as he was having difficulty getting in the tub to shower.        OT Problem List: Pain      OT Treatment/Interventions:      OT Goals(Current goals can be found in the care plan section) Acute Rehab OT Goals Patient Stated Goal: Home today OT Goal Formulation: With patient Time For Goal Achievement: 04/28/20 Potential to Achieve Goals: Good  OT Frequency:     Barriers to D/C:            Co-evaluation              AM-PAC OT "6 Clicks" Daily Activity     Outcome Measure   Help from another person taking care of personal grooming?: None Help from another person toileting, which includes using toliet, bedpan, or urinal?: None Help from another person bathing (including washing, rinsing, drying)?: None Help from another person to put on and taking off regular upper body clothing?: None Help from another person to put on and taking off regular lower body clothing?: None 6 Click Score: 20   End of Session Equipment Utilized During Treatment: Rolling walker Nurse Communication: Mobility status  Activity Tolerance: Patient tolerated treatment well Patient left: in bed;with call bell/phone within reach  OT Visit Diagnosis: Pain                Time: 0901-0919 OT Time Calculation (min): 18 min Charges:  OT General Charges $OT Visit: 1 Visit OT Evaluation $OT Eval Moderate Complexity: 1 Mod  04/28/2020  Rich, OTR/L  Acute Rehabilitation Services  Office:  [phone]   Metta Clines 04/28/2020, 9:23 AM

## 2020-04-28 NOTE — Plan of Care (Signed)
Patient is discharged from room 3C06 at this time. Alert and in stable condition. JP drain was removed per MD order.IV site d/c'd and instructions read to patient with understanding verbalized and all questions answered. Left unit via wheelchair with all belongings at side.

## 2020-05-25 ENCOUNTER — Ambulatory Visit: Payer: Self-pay | Admitting: Nurse Practitioner

## 2020-06-12 DIAGNOSIS — M5416 Radiculopathy, lumbar region: Secondary | ICD-10-CM | POA: Diagnosis not present

## 2020-06-12 DIAGNOSIS — I10 Essential (primary) hypertension: Secondary | ICD-10-CM | POA: Diagnosis not present

## 2020-07-13 ENCOUNTER — Encounter: Payer: Self-pay | Admitting: Family Medicine

## 2020-07-13 ENCOUNTER — Ambulatory Visit (INDEPENDENT_AMBULATORY_CARE_PROVIDER_SITE_OTHER): Payer: Medicare Other | Admitting: Family Medicine

## 2020-07-13 ENCOUNTER — Other Ambulatory Visit: Payer: Self-pay

## 2020-07-13 VITALS — BP 125/61 | HR 58 | Temp 97.4°F | Ht 70.0 in | Wt 174.0 lb

## 2020-07-13 DIAGNOSIS — R600 Localized edema: Secondary | ICD-10-CM | POA: Diagnosis not present

## 2020-07-13 NOTE — Progress Notes (Signed)
Assessment & Plan:  1. Bilateral lower extremity edema Education provided on edema. Encouraged compression hose and elevation of feet when sitting. Patient aware he needs to decrease salt consumption.  - Anemia Profile B - CMP14+EGFR - TSH - Brain natriuretic peptide   Follow up plan: Return if symptoms worsen or fail to improve.  Hendricks Limes, MSN, APRN, FNP-C [facility]  Subjective:   Patient ID: Darryl Ward, male    DOB: 02-Nov-1945, 75 y.o.   MRN: [id number]  HPI: Darryl Ward is a 76 y.o. male presenting on 07/13/2020 for Foot Swelling (Bilateral x 1 month )  Patient is accompanied by his roommate (POA) who helps provide history.  Patient has been having bilateral foot and ankle swelling x1 month. Throughout the day the swelling goes half way up his calves. He does sleep in the bed. The swelling goes down in his legs but remains in his ankles and feet upon waking in the morning. Denies shortness of breath. Admits to eating a lot of salt.    ROS: Negative unless specifically indicated above in HPI.   Relevant past medical history reviewed and updated as indicated.   Allergies and medications reviewed and updated.   Current Outpatient Medications:    aspirin EC 81 MG tablet, Take 1 tablet (81 mg total) by mouth daily., Disp: 90 tablet, Rfl: 3   cyclobenzaprine (FLEXERIL) 10 MG tablet, Take 1 tablet (10 mg total) by mouth 3 (three) times daily as needed for muscle spasms., Disp: 30 tablet, Rfl: 0   docusate sodium (COLACE) 100 MG capsule, Take 1 capsule (100 mg total) by mouth 2 (two) times daily., Disp: 60 capsule, Rfl: 2   metoprolol succinate (TOPROL-XL) 25 MG 24 hr tablet, Take 1 tablet (25 mg total) by mouth daily., Disp: 90 tablet, Rfl: 3   oxyCODONE-acetaminophen (PERCOCET/ROXICET) 5-325 MG tablet, Take 1-2 tablets by mouth every 4 (four) hours as needed for moderate pain or severe pain., Disp: 45 tablet, Rfl: 0   atorvastatin (LIPITOR)  80 MG tablet, Take 1 tablet (80 mg total) by mouth daily., Disp: 90 tablet, Rfl: 3  No Known Allergies  Objective:   BP 125/61   Pulse (!) 58   Temp (!) 97.4 F (36.3 C) (Temporal)   Ht 5' 10"  (1.778 m)   Wt 174 lb (78.9 kg)   SpO2 99%   BMI 24.97 kg/m    Physical Exam Vitals reviewed.  Constitutional:      General: He is not in acute distress.    Appearance: Normal appearance. He is not ill-appearing, toxic-appearing or diaphoretic.  HENT:     Head: Normocephalic and atraumatic.  Eyes:     General: No scleral icterus.       Right eye: No discharge.        Left eye: No discharge.     Conjunctiva/sclera: Conjunctivae normal.  Cardiovascular:     Rate and Rhythm: Normal rate and regular rhythm.     Heart sounds: Normal heart sounds. No murmur heard.   No friction rub. No gallop.  Pulmonary:     Effort: Pulmonary effort is normal. No respiratory distress.     Breath sounds: Normal breath sounds. No stridor. No wheezing, rhonchi or rales.  Musculoskeletal:        General: Normal range of motion.     Cervical back: Normal range of motion.     Right lower leg: No edema.     Left lower leg: 1+ Edema  present.     Right ankle: No swelling.     Left ankle: Swelling present.     Right foot: No swelling.     Left foot: Swelling present.  Skin:    General: Skin is warm and dry.  Neurological:     Mental Status: He is alert and oriented to person, place, and time. Mental status is at baseline.  Psychiatric:        Mood and Affect: Mood normal.        Behavior: Behavior normal.        Thought Content: Thought content normal.        Judgment: Judgment normal.

## 2020-07-13 NOTE — Patient Instructions (Signed)
Compression hose Elevate your legs Decrease salt intake

## 2020-07-14 LAB — CMP14+EGFR
ALT: 7 IU/L (ref 0–44)
AST: 12 IU/L (ref 0–40)
Albumin/Globulin Ratio: 1.2 (ref 1.2–2.2)
Albumin: 3.7 g/dL (ref 3.7–4.7)
Alkaline Phosphatase: 73 IU/L (ref 44–121)
BUN/Creatinine Ratio: 11 (ref 10–24)
BUN: 10 mg/dL (ref 8–27)
Bilirubin Total: 0.4 mg/dL (ref 0.0–1.2)
CO2: 20 mmol/L (ref 20–29)
Calcium: 9.4 mg/dL (ref 8.6–10.2)
Chloride: 105 mmol/L (ref 96–106)
Creatinine, Ser: 0.94 mg/dL (ref 0.76–1.27)
Globulin, Total: 3.1 g/dL (ref 1.5–4.5)
Glucose: 84 mg/dL (ref 65–99)
Potassium: 4.5 mmol/L (ref 3.5–5.2)
Sodium: 139 mmol/L (ref 134–144)
Total Protein: 6.8 g/dL (ref 6.0–8.5)
eGFR: 86 mL/min/{1.73_m2} (ref >59)

## 2020-07-14 LAB — ANEMIA PROFILE B
Basophils Absolute: 0.1 10*3/uL (ref 0.0–0.2)
Basos: 1 %
EOS (ABSOLUTE): 0.1 10*3/uL (ref 0.0–0.4)
Eos: 1 %
Ferritin: 248 ng/mL (ref 30–400)
Folate: 6.7 ng/mL (ref >3.0)
Hematocrit: 38.4 % (ref 37.5–51.0)
Hemoglobin: 13.1 g/dL (ref 13.0–17.7)
Immature Grans (Abs): 0 10*3/uL (ref 0.0–0.1)
Immature Granulocytes: 0 %
Iron Saturation: 20 % (ref 15–55)
Iron: 49 ug/dL (ref 38–169)
Lymphocytes Absolute: 2.1 10*3/uL (ref 0.7–3.1)
Lymphs: 18 %
MCH: 31.1 pg (ref 26.6–33.0)
MCHC: 34.1 g/dL (ref 31.5–35.7)
MCV: 91 fL (ref 79–97)
Monocytes Absolute: 0.8 10*3/uL (ref 0.1–0.9)
Monocytes: 7 %
Neutrophils Absolute: 8.5 10*3/uL — ABNORMAL HIGH (ref 1.4–7.0)
Neutrophils: 73 %
Platelets: 198 10*3/uL (ref 150–450)
RBC: 4.21 x10E6/uL (ref 4.14–5.80)
RDW: 13.2 % (ref 11.6–15.4)
Retic Ct Pct: 1.3 % (ref 0.6–2.6)
Total Iron Binding Capacity: 242 ug/dL — ABNORMAL LOW (ref 250–450)
UIBC: 193 ug/dL (ref 111–343)
Vitamin B-12: 211 pg/mL — ABNORMAL LOW (ref 232–1245)
WBC: 11.6 10*3/uL — ABNORMAL HIGH (ref 3.4–10.8)

## 2020-07-14 LAB — BRAIN NATRIURETIC PEPTIDE: BNP: 71.2 pg/mL (ref 0.0–100.0)

## 2020-07-14 LAB — TSH: TSH: 1.19 u[IU]/mL (ref 0.450–4.500)

## 2020-08-07 DIAGNOSIS — I10 Essential (primary) hypertension: Secondary | ICD-10-CM | POA: Diagnosis not present

## 2020-08-07 DIAGNOSIS — Z6824 Body mass index (BMI) 24.0-24.9, adult: Secondary | ICD-10-CM | POA: Diagnosis not present

## 2020-08-07 DIAGNOSIS — M5416 Radiculopathy, lumbar region: Secondary | ICD-10-CM | POA: Diagnosis not present

## 2020-11-20 DIAGNOSIS — Z6825 Body mass index (BMI) 25.0-25.9, adult: Secondary | ICD-10-CM | POA: Diagnosis not present

## 2020-11-20 DIAGNOSIS — Z8673 Personal history of transient ischemic attack (TIA), and cerebral infarction without residual deficits: Secondary | ICD-10-CM | POA: Diagnosis not present

## 2020-11-20 DIAGNOSIS — D329 Benign neoplasm of meninges, unspecified: Secondary | ICD-10-CM | POA: Diagnosis not present

## 2020-11-20 DIAGNOSIS — M5416 Radiculopathy, lumbar region: Secondary | ICD-10-CM | POA: Diagnosis not present

## 2020-12-07 DIAGNOSIS — Z8673 Personal history of transient ischemic attack (TIA), and cerebral infarction without residual deficits: Secondary | ICD-10-CM | POA: Diagnosis not present

## 2020-12-07 DIAGNOSIS — R531 Weakness: Secondary | ICD-10-CM | POA: Diagnosis not present

## 2020-12-07 DIAGNOSIS — D329 Benign neoplasm of meninges, unspecified: Secondary | ICD-10-CM | POA: Diagnosis not present

## 2020-12-07 DIAGNOSIS — G9389 Other specified disorders of brain: Secondary | ICD-10-CM | POA: Diagnosis not present

## 2020-12-25 DIAGNOSIS — D329 Benign neoplasm of meninges, unspecified: Secondary | ICD-10-CM | POA: Diagnosis not present

## 2020-12-25 DIAGNOSIS — M5416 Radiculopathy, lumbar region: Secondary | ICD-10-CM | POA: Diagnosis not present

## 2021-02-23 ENCOUNTER — Other Ambulatory Visit: Payer: Self-pay | Admitting: Nurse Practitioner

## 2021-02-23 DIAGNOSIS — I739 Peripheral vascular disease, unspecified: Secondary | ICD-10-CM

## 2021-02-23 NOTE — Telephone Encounter (Signed)
MMM NTBS Last OV in April. Mail order not sent

## 2021-02-26 ENCOUNTER — Encounter (HOSPITAL_COMMUNITY): Payer: Self-pay

## 2021-02-26 ENCOUNTER — Other Ambulatory Visit (HOSPITAL_COMMUNITY): Payer: Self-pay

## 2021-02-26 DIAGNOSIS — Z87891 Personal history of nicotine dependence: Secondary | ICD-10-CM

## 2021-02-26 DIAGNOSIS — Z122 Encounter for screening for malignant neoplasm of respiratory organs: Secondary | ICD-10-CM

## 2021-02-26 NOTE — Progress Notes (Signed)
LDCT scheduled for 3/20.

## 2021-02-26 NOTE — Telephone Encounter (Signed)
Patient is not seeing MMM anymore. He is going to another practice and wouldn't tell me the name of the PCP

## 2021-02-27 DIAGNOSIS — I10 Essential (primary) hypertension: Secondary | ICD-10-CM | POA: Diagnosis not present

## 2021-02-27 DIAGNOSIS — D649 Anemia, unspecified: Secondary | ICD-10-CM | POA: Diagnosis not present

## 2021-04-09 ENCOUNTER — Ambulatory Visit (HOSPITAL_COMMUNITY)
Admission: RE | Admit: 2021-04-09 | Discharge: 2021-04-09 | Disposition: A | Discharge status: Home / Self Care | Payer: Medicare Other | Source: Ambulatory Visit | Attending: Physician Assistant | Admitting: Physician Assistant

## 2021-04-09 ENCOUNTER — Other Ambulatory Visit: Payer: Self-pay

## 2021-04-09 DIAGNOSIS — F17210 Nicotine dependence, cigarettes, uncomplicated: Secondary | ICD-10-CM | POA: Diagnosis not present

## 2021-04-09 DIAGNOSIS — Z122 Encounter for screening for malignant neoplasm of respiratory organs: Secondary | ICD-10-CM | POA: Insufficient documentation

## 2021-04-09 DIAGNOSIS — Z87891 Personal history of nicotine dependence: Secondary | ICD-10-CM | POA: Diagnosis not present

## 2021-04-16 DIAGNOSIS — R030 Elevated blood-pressure reading, without diagnosis of hypertension: Secondary | ICD-10-CM | POA: Diagnosis not present

## 2021-04-16 DIAGNOSIS — Z6825 Body mass index (BMI) 25.0-25.9, adult: Secondary | ICD-10-CM | POA: Diagnosis not present

## 2021-04-16 DIAGNOSIS — M5416 Radiculopathy, lumbar region: Secondary | ICD-10-CM | POA: Diagnosis not present

## 2021-05-29 ENCOUNTER — Encounter: Payer: Self-pay | Admitting: Emergency Medicine

## 2021-05-29 ENCOUNTER — Ambulatory Visit (INDEPENDENT_AMBULATORY_CARE_PROVIDER_SITE_OTHER): Payer: Medicare Other | Admitting: Emergency Medicine

## 2021-05-29 VITALS — BP 122/74 | HR 68 | Temp 97.8°F | Ht 70.0 in | Wt 172.8 lb

## 2021-05-29 DIAGNOSIS — J449 Chronic obstructive pulmonary disease, unspecified: Secondary | ICD-10-CM | POA: Diagnosis not present

## 2021-05-29 DIAGNOSIS — Z720 Tobacco use: Secondary | ICD-10-CM

## 2021-05-29 DIAGNOSIS — R911 Solitary pulmonary nodule: Secondary | ICD-10-CM | POA: Diagnosis not present

## 2021-05-29 NOTE — Progress Notes (Signed)
, ? ?Subjective:  ? ? Patient ID: Darryl Ward, male    DOB: [birth year], 76 y.o.   MRN: [id number] ? ?HPI ?76 year old active smoker (60 pack years) with a history of CAD/PTCI CVA, AAA, hypertension and hyperlipidemia.  He participates in the lung cancer screening program and had reassuring CT scans through 11/2019.  He is referred now for evaluation of an abnormal scan performed on 04/09/2021 as below. ?He is on Trelegy, has daily cough. No albuterol use. Some minimal exertional SOB.  ? ?Lung cancer screening CT 04/09/2021 reviewed by me shows some emphysematous change, peripheral reticulation bilaterally, base predominant.  There is a 12.7 mm mixed density nodule that has increased in size from 5.5 mm on his prior study in 2021.  No significant mediastinal or hilar adenopathy ? ? ?Review of Systems ?As per HPi ? ?Past Medical History:  ?Diagnosis Date  ? AAA (abdominal aortic aneurysm) ([place])   ? Arthritis   ? Carotid artery disease ([place])   ? Nonobstructive  ? Cataract   ? Coronary atherosclerosis of native coronary artery   ? PTCA small diagonal 2007 otherwise nonobstructive CAD, EF 50-55%  ? Essential hypertension, benign   ? Hyperlipidemia   ? NSTEMI (non-ST elevated myocardial infarction) [facility]) 2007  ? Stroke [facility]) 2004  ? TIA (transient ischemic attack) 2006  ?  ? ?Family History  ?Problem Relation Age of Onset  ? Hyperlipidemia Sister   ? Heart attack Brother 55  ?  ? ?Social History  ? ?Socioeconomic History  ? Marital status: Divorced  ?  Spouse name: Not on file  ? Number of children: 1  ? Years of education: 87  ? Highest education level: 11th grade  ?Occupational History  ?  Employer: Probation officer  ?Tobacco Use  ? Smoking status: Every Day  ?  Packs/day: 1.00  ?  Years: 40.00  ?  Pack years: 40.00  ?  Types: Cigarettes  ? Smokeless tobacco: Never  ? Tobacco comments:  ?  1 pack of cigarettes smoked daily. 05/29/21 ARJ, RN   ?Vaping Use  ? Vaping Use: Never used  ?Substance and Sexual Activity  ?  Alcohol use: No  ?  Comment: Prior history of regular alcohol use  ? Drug use: No  ? Sexual activity: Not Currently  ?Other Topics Concern  ? Not on file  ?Social History Narrative  ? Not on file  ? ?Social Determinants of Health  ? ?Financial Resource Strain: Not on file  ?Food Insecurity: Not on file  ?Transportation Needs: Not on file  ?Physical Activity: Not on file  ?Stress: Not on file  ?Social Connections: Not on file  ?Intimate Partner Violence: Not on file  ?  ? ?No Known Allergies  ? ?Outpatient Medications Prior to Visit  ?Medication Sig Dispense Refill  ? aspirin EC 81 MG tablet Take 1 tablet (81 mg total) by mouth daily. 90 tablet 3  ? celecoxib (CELEBREX) 200 MG capsule Take by mouth.    ? chlorthalidone (HYGROTON) 25 MG tablet Take 25 mg by mouth every morning.    ? cyclobenzaprine (FLEXERIL) 10 MG tablet Take 1 tablet (10 mg total) by mouth 3 (three) times daily as needed for muscle spasms. 30 tablet 0  ? docusate sodium (COLACE) 100 MG capsule Take 1 capsule (100 mg total) by mouth 2 (two) times daily. 60 capsule 2  ? Fluticasone-Umeclidin-Vilant 100-62.5-25 MCG/ACT AEPB Inhale 1 puff into the lungs daily.    ? HYDROcodone-acetaminophen (NORCO) 10-325 MG  tablet SMARTSIG:1 Tablet(s) By Mouth Every 12 Hours    ? metoprolol succinate (TOPROL-XL) 25 MG 24 hr tablet Take 1 tablet (25 mg total) by mouth daily. 90 tablet 3  ? oxyCODONE-acetaminophen (PERCOCET/ROXICET) 5-325 MG tablet Take 1-2 tablets by mouth every 4 (four) hours as needed for moderate pain or severe pain. 45 tablet 0  ? spironolactone (ALDACTONE) 25 MG tablet Take 25 mg by mouth daily.    ? atorvastatin (LIPITOR) 80 MG tablet Take 1 tablet (80 mg total) by mouth daily. 90 tablet 3  ? ?No facility-administered medications prior to visit.  ? ? ? ? ?   ?Objective:  ? Physical Exam ?Vitals:  ? 05/29/21 1352  ?BP: 122/74  ?Pulse: 68  ?Temp: 97.8 ?F (36.6 ?C)  ?TempSrc: Oral  ?SpO2: 99%  ?Weight: 172 lb 12.8 oz (78.4 kg)  ?Height: 5\' 10"  (1.778  m)  ? ? ?Gen: Pleasant, well-nourished, chronically ill-appearing man, in no distress,  normal affect ? ?ENT: No lesions,  mouth clear,  oropharynx clear, no postnasal drip, hoarse voice ? ?Neck: No JVD, no stridor ? ?Lungs: No use of accessory muscles, no crackles or wheezing on normal respiration, end expiratory wheezing on forced expiration ? ?Cardiovascular: RRR, heart sounds normal, no murmur or gallops, trace left ankle peripheral edema ? ?Musculoskeletal: No deformities, no cyanosis or clubbing ? ?Neuro: alert, awake, non focal ? ?Skin: Warm, no lesions or rash ? ? ?   ?Assessment & Plan:  ?Pulmonary nodule 1 cm or greater in diameter ?[place] right upper lobe pulmonary nodule identified initially on CT chest 2021, has enlarged from 5.5 mm to 12.7 mm on his most recent lung cancer screening CT 04/09/2021.  High risk for malignancy.  He continues to smoke 1 pack daily, has emphysema and has some functional limitation.  Not clear to me that he is a good candidate for primary resection.  I think tissue diagnosis by bronchoscopy makes the most sense, would give [name] the option of possible SBRT, possible referral for surgery.  I recommended navigational bronchoscopy and he agrees.  Understands the risk, benefits and rationale.  We will work on scheduling ? ?We will arrange for navigational bronchoscopy to evaluate your right lung pulmonary nodule.  This will be done as an outpatient under general anesthesia at [facility] endoscopy.  You will need a designated driver.  You will need to stop your aspirin 2 days prior.  We will try to get this scheduled for 06/11/2021. ?We will perform a repeat CT scan of the chest at [facility] to use during the bronchoscopy ?Follow with Dr Lamonte Sakai in 1 month or next available ? ?Tobacco abuse ?Try to work on decreasing your cigarettes. ? ?COPD (chronic obstructive pulmonary disease) ([place]) ?Continue Trelegy 1 elation once daily.  Rinse gargle after using. ?You can use albuterol 2  puffs if needed for shortness of breath, chest tightness, wheezing. ? ? ?Baltazar Apo, MD, PhD ?05/29/2021, 2:30 PM ?[facility] ?[phone] or if no answer before 7:00PM call [phone] ?For any issues after 7:00PM please call eLink [phone] ? ? ?

## 2021-05-29 NOTE — Patient Instructions (Signed)
We will arrange for navigational bronchoscopy to evaluate your right lung pulmonary nodule.  This will be done as an outpatient under general anesthesia at [facility] endoscopy.  You will need a designated driver.  You will need to stop your aspirin 2 days prior.  We will try to get this scheduled for 06/11/2021. ?We will perform a repeat CT scan of the chest at [facility] to use during the bronchoscopy ?Continue Trelegy 1 elation once daily.  Rinse gargle after using. ?You can use albuterol 2 puffs if needed for shortness of breath, chest tightness, wheezing. ?Try to work on decreasing your cigarettes. ?Follow with Dr Lamonte Sakai in 1 month or next available ?

## 2021-05-29 NOTE — Assessment & Plan Note (Signed)
Try to work on decreasing your cigarettes. ?

## 2021-05-29 NOTE — Assessment & Plan Note (Signed)
Groundglass right upper lobe pulmonary nodule identified initially on CT chest 2021, has enlarged from 5.5 mm to 12.7 mm on his most recent lung cancer screening CT 04/09/2021.  High risk for malignancy.  He continues to smoke 1 pack daily, has emphysema and has some functional limitation.  Not clear to me that he is a good candidate for primary resection.  I think tissue diagnosis by bronchoscopy makes the most sense, would give [name] the option of possible SBRT, possible referral for surgery.  I recommended navigational bronchoscopy and he agrees.  Understands the risk, benefits and rationale.  We will work on scheduling ? ?We will arrange for navigational bronchoscopy to evaluate your right lung pulmonary nodule.  This will be done as an outpatient under general anesthesia at [facility] endoscopy.  You will need a designated driver.  You will need to stop your aspirin 2 days prior.  We will try to get this scheduled for 06/11/2021. ?We will perform a repeat CT scan of the chest at [facility] to use during the bronchoscopy ?Follow with Dr Lamonte Sakai in 1 month or next available ?

## 2021-05-29 NOTE — Assessment & Plan Note (Signed)
Continue Trelegy 1 elation once daily.  Rinse gargle after using. ?You can use albuterol 2 puffs if needed for shortness of breath, chest tightness, wheezing. ?

## 2021-05-29 NOTE — H&P (View-Only) (Signed)
,  Subjective:    Patient ID: Darryl Ward, male    DOB: Aug 09, 1945, 76 y.o.   MRN: [id number]  HPI 76 year old active smoker (60 pack years) with a history of CAD/PTCI CVA, AAA, hypertension and hyperlipidemia.  He participates in the lung cancer screening program and had reassuring CT scans through 11/2019.  He is referred now for evaluation of an abnormal scan performed on 04/09/2021 as below. He is on Trelegy, has daily cough. No albuterol use. Some minimal exertional SOB.   Lung cancer screening CT 04/09/2021 reviewed by me shows some emphysematous change, peripheral reticulation bilaterally, base predominant.  There is a 12.7 mm mixed density nodule that has increased in size from 5.5 mm on his prior study in 2021.  No significant mediastinal or hilar adenopathy   Review of Systems As per HPi  Past Medical History:  Diagnosis Date   AAA (abdominal aortic aneurysm) ([place])    Arthritis    Carotid artery disease ([place])    Nonobstructive   Cataract    Coronary atherosclerosis of native coronary artery    PTCA small diagonal 2007 otherwise nonobstructive CAD, EF 50-55%   Essential hypertension, benign    Hyperlipidemia    NSTEMI (non-ST elevated myocardial infarction) ([place]) 2007   Stroke [facility]) 2004   TIA (transient ischemic attack) 2006     Family History  Problem Relation Age of Onset   Hyperlipidemia Sister    Heart attack Brother 60     Social History   Socioeconomic History   Marital status: Divorced    Spouse name: Not on file   Number of children: 1   Years of education: 11   Highest education level: 11th grade  Occupational History    Employer: Probation officer  Tobacco Use   Smoking status: Every Day    Packs/day: 1.00    Years: 40.00    Pack years: 40.00    Types: Cigarettes   Smokeless tobacco: Never   Tobacco comments:    1 pack of cigarettes smoked daily. 05/29/21 ARJ, RN   Vaping Use   Vaping Use: Never used  Substance and Sexual Activity    Alcohol use: No    Comment: Prior history of regular alcohol use   Drug use: No   Sexual activity: Not Currently  Other Topics Concern   Not on file  Social History Narrative   Not on file   Social Determinants of Health   Financial Resource Strain: Not on file  Food Insecurity: Not on file  Transportation Needs: Not on file  Physical Activity: Not on file  Stress: Not on file  Social Connections: Not on file  Intimate Partner Violence: Not on file     No Known Allergies   Outpatient Medications Prior to Visit  Medication Sig Dispense Refill   aspirin EC 81 MG tablet Take 1 tablet (81 mg total) by mouth daily. 90 tablet 3   celecoxib (CELEBREX) 200 MG capsule Take by mouth.     chlorthalidone (HYGROTON) 25 MG tablet Take 25 mg by mouth every morning.     cyclobenzaprine (FLEXERIL) 10 MG tablet Take 1 tablet (10 mg total) by mouth 3 (three) times daily as needed for muscle spasms. 30 tablet 0   docusate sodium (COLACE) 100 MG capsule Take 1 capsule (100 mg total) by mouth 2 (two) times daily. 60 capsule 2   Fluticasone-Umeclidin-Vilant 100-62.5-25 MCG/ACT AEPB Inhale 1 puff into the lungs daily.     HYDROcodone-acetaminophen (NORCO) 10-325 MG  tablet SMARTSIG:1 Tablet(s) By Mouth Every 12 Hours     metoprolol succinate (TOPROL-XL) 25 MG 24 hr tablet Take 1 tablet (25 mg total) by mouth daily. 90 tablet 3   oxyCODONE-acetaminophen (PERCOCET/ROXICET) 5-325 MG tablet Take 1-2 tablets by mouth every 4 (four) hours as needed for moderate pain or severe pain. 45 tablet 0   spironolactone (ALDACTONE) 25 MG tablet Take 25 mg by mouth daily.     atorvastatin (LIPITOR) 80 MG tablet Take 1 tablet (80 mg total) by mouth daily. 90 tablet 3   No facility-administered medications prior to visit.         Objective:   Physical Exam Vitals:   05/29/21 1352  BP: 122/74  Pulse: 68  Temp: 97.8 F (36.6 C)  TempSrc: Oral  SpO2: 99%  Weight: 172 lb 12.8 oz (78.4 kg)  Height: 5\' 10"  (1.778  m)    Gen: Pleasant, well-nourished, chronically ill-appearing man, in no distress,  normal affect  ENT: No lesions,  mouth clear,  oropharynx clear, no postnasal drip, hoarse voice  Neck: No JVD, no stridor  Lungs: No use of accessory muscles, no crackles or wheezing on normal respiration, end expiratory wheezing on forced expiration  Cardiovascular: RRR, heart sounds normal, no murmur or gallops, trace left ankle peripheral edema  Musculoskeletal: No deformities, no cyanosis or clubbing  Neuro: alert, awake, non focal  Skin: Warm, no lesions or rash      Assessment & Plan:  Pulmonary nodule 1 cm or greater in diameter Groundglass right upper lobe pulmonary nodule identified initially on CT chest 2021, has enlarged from 5.5 mm to 12.7 mm on his most recent lung cancer screening CT 04/09/2021.  High risk for malignancy.  He continues to smoke 1 pack daily, has emphysema and has some functional limitation.  Not clear to me that he is a good candidate for primary resection.  I think tissue diagnosis by bronchoscopy makes the most sense, would give [name] the option of possible SBRT, possible referral for surgery.  I recommended navigational bronchoscopy and he agrees.  Understands the risk, benefits and rationale.  We will work on scheduling  We will arrange for navigational bronchoscopy to evaluate your right lung pulmonary nodule.  This will be done as an outpatient under general anesthesia at [facility] endoscopy.  You will need a designated driver.  You will need to stop your aspirin 2 days prior.  We will try to get this scheduled for 06/11/2021. We will perform a repeat CT scan of the chest at [facility] to use during the bronchoscopy Follow with Dr Lamonte Sakai in 1 month or next available  Tobacco abuse Try to work on decreasing your cigarettes.  COPD (chronic obstructive pulmonary disease) (HCC) Continue Trelegy 1 elation once daily.  Rinse gargle after using. You can use albuterol 2  puffs if needed for shortness of breath, chest tightness, wheezing.   Baltazar Apo, MD, PhD 05/29/2021, 2:30 PM [facility] [phone] or if no answer before 7:00PM call [phone] For any issues after 7:00PM please call eLink [phone]

## 2021-06-04 ENCOUNTER — Encounter (INDEPENDENT_AMBULATORY_CARE_PROVIDER_SITE_OTHER): Payer: Self-pay

## 2021-06-08 ENCOUNTER — Other Ambulatory Visit: Payer: Self-pay

## 2021-06-08 ENCOUNTER — Other Ambulatory Visit: Payer: Self-pay | Admitting: Emergency Medicine

## 2021-06-08 ENCOUNTER — Ambulatory Visit (HOSPITAL_COMMUNITY)
Admission: RE | Admit: 2021-06-08 | Discharge: 2021-06-08 | Disposition: A | Discharge status: Home / Self Care | Payer: Medicare Other | Source: Ambulatory Visit | Attending: Emergency Medicine | Admitting: Emergency Medicine

## 2021-06-08 ENCOUNTER — Encounter (HOSPITAL_COMMUNITY): Payer: Self-pay | Admitting: Emergency Medicine

## 2021-06-08 DIAGNOSIS — R911 Solitary pulmonary nodule: Secondary | ICD-10-CM

## 2021-06-08 DIAGNOSIS — I700 Atherosclerosis of aorta: Secondary | ICD-10-CM | POA: Diagnosis not present

## 2021-06-08 DIAGNOSIS — J439 Emphysema, unspecified: Secondary | ICD-10-CM | POA: Diagnosis not present

## 2021-06-08 DIAGNOSIS — R918 Other nonspecific abnormal finding of lung field: Secondary | ICD-10-CM | POA: Diagnosis not present

## 2021-06-08 LAB — SARS CORONAVIRUS 2 (TAT 6-24 HRS): SARS Coronavirus 2: NEGATIVE

## 2021-06-08 NOTE — Progress Notes (Signed)
Spoke with pt for pre-op call. Pt has hx of HTN and CAD. Had MI in 2007. Pt's cardiologist is Dr. Percival Spanish. Last office visit was 03/31/20, was told to follow up as needed. Pt denies any recent chest pain or shortness of breath. He states he is not diabetic.   PCP is Dr. Rich Reining in [place], [place]  Covid test to be done today, instructed pt to wear mask when out in public. He voiced understanding.  Shower instructions given to pt and friend.

## 2021-06-11 ENCOUNTER — Ambulatory Visit (HOSPITAL_BASED_OUTPATIENT_CLINIC_OR_DEPARTMENT_OTHER): Payer: Medicare Other | Admitting: Anesthesiology

## 2021-06-11 ENCOUNTER — Other Ambulatory Visit: Payer: Self-pay

## 2021-06-11 ENCOUNTER — Encounter (HOSPITAL_COMMUNITY): Payer: Self-pay | Admitting: Emergency Medicine

## 2021-06-11 ENCOUNTER — Ambulatory Visit (HOSPITAL_COMMUNITY): Payer: Medicare Other

## 2021-06-11 ENCOUNTER — Ambulatory Visit (HOSPITAL_COMMUNITY): Payer: Medicare Other | Admitting: Anesthesiology

## 2021-06-11 ENCOUNTER — Encounter (HOSPITAL_COMMUNITY)
Admission: RE | Disposition: A | Discharge status: Home / Self Care | Payer: Self-pay | Source: Home / Self Care | Attending: Emergency Medicine

## 2021-06-11 ENCOUNTER — Ambulatory Visit (HOSPITAL_COMMUNITY)
Admission: RE | Admit: 2021-06-11 | Discharge: 2021-06-11 | Disposition: A | Discharge status: Home / Self Care | Payer: Medicare Other | Attending: Emergency Medicine | Admitting: Emergency Medicine

## 2021-06-11 DIAGNOSIS — I2510 Atherosclerotic heart disease of native coronary artery without angina pectoris: Secondary | ICD-10-CM | POA: Diagnosis not present

## 2021-06-11 DIAGNOSIS — J449 Chronic obstructive pulmonary disease, unspecified: Secondary | ICD-10-CM | POA: Diagnosis not present

## 2021-06-11 DIAGNOSIS — J439 Emphysema, unspecified: Secondary | ICD-10-CM | POA: Diagnosis not present

## 2021-06-11 DIAGNOSIS — I10 Essential (primary) hypertension: Secondary | ICD-10-CM

## 2021-06-11 DIAGNOSIS — F17210 Nicotine dependence, cigarettes, uncomplicated: Secondary | ICD-10-CM | POA: Diagnosis not present

## 2021-06-11 DIAGNOSIS — R911 Solitary pulmonary nodule: Secondary | ICD-10-CM | POA: Diagnosis not present

## 2021-06-11 DIAGNOSIS — I252 Old myocardial infarction: Secondary | ICD-10-CM

## 2021-06-11 DIAGNOSIS — C3411 Malignant neoplasm of upper lobe, right bronchus or lung: Secondary | ICD-10-CM | POA: Diagnosis not present

## 2021-06-11 DIAGNOSIS — Z8673 Personal history of transient ischemic attack (TIA), and cerebral infarction without residual deficits: Secondary | ICD-10-CM | POA: Insufficient documentation

## 2021-06-11 DIAGNOSIS — Z01818 Encounter for other preprocedural examination: Secondary | ICD-10-CM

## 2021-06-11 HISTORY — PX: VIDEO BRONCHOSCOPY WITH RADIAL ENDOBRONCHIAL ULTRASOUND: SHX6849

## 2021-06-11 HISTORY — PX: BRONCHIAL NEEDLE ASPIRATION BIOPSY: SHX5106

## 2021-06-11 HISTORY — PX: BRONCHIAL BIOPSY: SHX5109

## 2021-06-11 HISTORY — PX: BRONCHIAL BRUSHINGS: SHX5108

## 2021-06-11 HISTORY — PX: FIDUCIAL MARKER PLACEMENT: SHX6858

## 2021-06-11 LAB — CBC
HCT: 38.6 % — ABNORMAL LOW (ref 39.0–52.0)
Hemoglobin: 12.5 g/dL — ABNORMAL LOW (ref 13.0–17.0)
MCH: 31.6 pg (ref 26.0–34.0)
MCHC: 32.4 g/dL (ref 30.0–36.0)
MCV: 97.7 fL (ref 80.0–100.0)
Platelets: 163 10*3/uL (ref 150–400)
RBC: 3.95 MIL/uL — ABNORMAL LOW (ref 4.22–5.81)
RDW: 14 % (ref 11.5–15.5)
WBC: 8.8 10*3/uL (ref 4.0–10.5)
nRBC: 0 % (ref 0.0–0.2)

## 2021-06-11 LAB — BASIC METABOLIC PANEL
Anion gap: 5 (ref 5–15)
BUN: 11 mg/dL (ref 8–23)
CO2: 22 mmol/L (ref 22–32)
Calcium: 8.8 mg/dL — ABNORMAL LOW (ref 8.9–10.3)
Chloride: 110 mmol/L (ref 98–111)
Creatinine, Ser: 0.82 mg/dL (ref 0.61–1.24)
GFR, Estimated: >60 mL/min (ref >60)
Glucose, Bld: 87 mg/dL (ref 70–99)
Potassium: 4.8 mmol/L (ref 3.5–5.1)
Sodium: 137 mmol/L (ref 135–145)

## 2021-06-11 SURGERY — BRONCHOSCOPY, WITH BIOPSY USING ELECTROMAGNETIC NAVIGATION
Anesthesia: General

## 2021-06-11 MED ORDER — OXYCODONE HCL 5 MG PO TABS: 5.0000 mg | ORAL_TABLET | Freq: Once | ORAL | Status: DC | PRN

## 2021-06-11 MED ORDER — ONDANSETRON HCL 4 MG/2ML IJ SOLN
INTRAMUSCULAR | Status: DC | PRN
  Administered 2021-06-11: 4 mg via INTRAVENOUS

## 2021-06-11 MED ORDER — AMISULPRIDE (ANTIEMETIC) 5 MG/2ML IV SOLN
10.0000 mg | Freq: Once | INTRAVENOUS | Status: DC | PRN
  Filled 2021-06-11: qty 4

## 2021-06-11 MED ORDER — ACETAMINOPHEN 500 MG PO TABS: 1000.0000 mg | ORAL_TABLET | Freq: Once | ORAL | Status: DC

## 2021-06-11 MED ORDER — SUGAMMADEX SODIUM 200 MG/2ML IV SOLN
INTRAVENOUS | Status: DC | PRN
  Administered 2021-06-11: 300 mg via INTRAVENOUS

## 2021-06-11 MED ORDER — FENTANYL CITRATE (PF) 100 MCG/2ML IJ SOLN: 25.0000 ug | INTRAMUSCULAR | Status: DC | PRN

## 2021-06-11 MED ORDER — ASPIRIN EC 81 MG PO TBEC: 81.0000 mg | DELAYED_RELEASE_TABLET | Freq: Every day | ORAL | 3 refills | Status: DC

## 2021-06-11 MED ORDER — PROPOFOL 10 MG/ML IV BOLUS
INTRAVENOUS | Status: DC | PRN
  Administered 2021-06-11: 140 mg via INTRAVENOUS

## 2021-06-11 MED ORDER — DEXAMETHASONE SODIUM PHOSPHATE 10 MG/ML IJ SOLN
INTRAMUSCULAR | Status: DC | PRN
  Administered 2021-06-11: 5 mg via INTRAVENOUS

## 2021-06-11 MED ORDER — ONDANSETRON HCL 4 MG/2ML IJ SOLN: 4.0000 mg | Freq: Once | INTRAMUSCULAR | Status: DC | PRN

## 2021-06-11 MED ORDER — ROCURONIUM BROMIDE 10 MG/ML (PF) SYRINGE
PREFILLED_SYRINGE | INTRAVENOUS | Status: DC | PRN
  Administered 2021-06-11: 70 mg via INTRAVENOUS

## 2021-06-11 MED ORDER — LACTATED RINGERS IV SOLN: INTRAVENOUS | Status: DC

## 2021-06-11 MED ORDER — ACETAMINOPHEN 500 MG PO TABS
1000.0000 mg | ORAL_TABLET | Freq: Once | ORAL | Status: AC
  Administered 2021-06-11: 1000 mg via ORAL

## 2021-06-11 MED ORDER — FENTANYL CITRATE (PF) 250 MCG/5ML IJ SOLN
INTRAMUSCULAR | Status: DC | PRN
  Administered 2021-06-11: 50 ug via INTRAVENOUS

## 2021-06-11 MED ORDER — LIDOCAINE 2% (20 MG/ML) 5 ML SYRINGE
INTRAMUSCULAR | Status: DC | PRN
  Administered 2021-06-11: 80 mg via INTRAVENOUS

## 2021-06-11 MED ORDER — OXYCODONE HCL 5 MG/5ML PO SOLN
5.0000 mg | Freq: Once | ORAL | Status: DC | PRN
  Filled 2021-06-11: qty 5

## 2021-06-11 SURGICAL SUPPLY — 1 items: Super Lock Fiducial Marker ×1 IMPLANT

## 2021-06-11 NOTE — Anesthesia Procedure Notes (Signed)
Procedure Name: Intubation Date/Time: 06/11/2021 11:12 AM Performed by: Gaylene Brooks, CRNA Pre-anesthesia Checklist: Patient identified, Emergency Drugs available, Suction available and Patient being monitored Patient Re-evaluated:Patient Re-evaluated prior to induction Oxygen Delivery Method: Circle System Utilized Preoxygenation: Pre-oxygenation with 100% oxygen Induction Type: IV induction Ventilation: Mask ventilation without difficulty and Oral airway inserted - appropriate to patient size Laryngoscope Size: Sabra Heck and 2 Grade View: Grade I Tube type: Oral Tube size: 8.5 mm Number of attempts: 1 Airway Equipment and Method: Stylet and Oral airway Placement Confirmation: ETT inserted through vocal cords under direct vision, positive ETCO2 and breath sounds checked- equal and bilateral Secured at: 22 cm Tube secured with: Tape Dental Injury: Teeth and Oropharynx as per pre-operative assessment

## 2021-06-11 NOTE — Discharge Instructions (Signed)
Flexible Bronchoscopy, Care After This sheet gives you information about how to care for yourself after your test. Your doctor may also give you more specific instructions. If you have problems or questions, contact your doctor. Follow these instructions at home: Eating and drinking Do not eat or drink anything (not even water) for 2 hours after your test, or until your numbing medicine (local anesthetic) wears off. When your numbness is gone and your cough and gag reflexes have come back, you may: Eat only soft foods. Slowly drink liquids. The day after the test, go back to your normal diet. Driving Do not drive for 24 hours if you were given a medicine to help you relax (sedative). Do not drive or use heavy machinery while taking prescription pain medicine. General instructions  Take over-the-counter and prescription medicines only as told by your doctor. Return to your normal activities as told. Ask what activities are safe for you. Do not use any products that have nicotine or tobacco in them. This includes cigarettes and e-cigarettes. If you need help quitting, ask your doctor. Keep all follow-up visits as told by your doctor. This is important. It is very important if you had a tissue sample (biopsy) taken. Get help right away if: You have shortness of breath that gets worse. You get light-headed. You feel like you are going to pass out (faint). You have chest pain. You cough up: More than a little blood. More blood than before. Summary Do not eat or drink anything (not even water) for 2 hours after your test, or until your numbing medicine wears off. Do not use cigarettes. Do not use e-cigarettes. Get help right away if you have chest pain.  Please call our office for any questions or concerns.  [phone].  This information is not intended to replace advice given to you by your health care provider. Make sure you discuss any questions you have with your health care  provider. Document Released: 11/04/2008 Document Revised: 12/20/2016 Document Reviewed: 01/26/2016 Elsevier Patient Education  2020 [employer].

## 2021-06-11 NOTE — Interval H&P Note (Signed)
History and Physical Interval Note:  06/11/2021 9:18 AM  Darryl Ward  has presented today for surgery, with the diagnosis of RIGHT UPPER LOBE NODULE.  The various methods of treatment have been discussed with the patient and family. After consideration of risks, benefits and other options for treatment, the patient has consented to  Procedure(s): ROBOTIC ASSISTED NAVIGATIONAL BRONCHOSCOPY (N/A) as a surgical intervention.  The patient's history has been reviewed, patient examined, no change in status, stable for surgery.  I have reviewed the patient's chart and labs.  Questions were answered to the patient's satisfaction.     [name]

## 2021-06-11 NOTE — Anesthesia Postprocedure Evaluation (Signed)
Anesthesia Post Note  Patient: [name] Martindelcampo  Procedure(s) Performed: ROBOTIC ASSISTED NAVIGATIONAL BRONCHOSCOPY BRONCHIAL BIOPSIES BRONCHIAL BRUSHINGS BRONCHIAL NEEDLE ASPIRATION BIOPSIES VIDEO BRONCHOSCOPY WITH RADIAL ENDOBRONCHIAL ULTRASOUND FIDUCIAL MARKER PLACEMENT     Patient location during evaluation: PACU Anesthesia Type: General Level of consciousness: awake Pain management: pain level controlled Vital Signs Assessment: post-procedure vital signs reviewed and stable Respiratory status: spontaneous breathing and respiratory function stable Cardiovascular status: stable Postop Assessment: no apparent nausea or vomiting Anesthetic complications: no   No notable events documented.  Last Vitals:  Vitals:   06/11/21 1225 06/11/21 1240  BP: (!) 160/89 (!) 161/74  Pulse: 81 78  Resp: 20 10  Temp:  36.8 C  SpO2: 98% 97%    Last Pain:  Vitals:   06/11/21 1240  TempSrc:   PainSc: 0-No pain                 Merlinda Frederick

## 2021-06-11 NOTE — Anesthesia Preprocedure Evaluation (Addendum)
Anesthesia Evaluation  Patient identified by MRN, date of birth, ID band Patient awake    Reviewed: Allergy & Precautions, NPO status , Patient's Chart, lab work & pertinent test results  Airway Mallampati: II  TM Distance: >3 FB Neck ROM: Full    Dental  (+) Edentulous Upper, Edentulous Lower   Pulmonary COPD,  COPD inhaler, Current Smoker,  + cough    + decreased breath sounds+ wheezing      Cardiovascular hypertension, + CAD, + Past MI and + Peripheral Vascular Disease  Normal cardiovascular exam Rhythm:Regular Rate:Normal  AAA  Sinus rhythm with Premature atrial complexes Incomplete right bundle branch block Borderline ECG When compared with ECG of 04-Feb-2020 12:24, PREVIOUS ECG IS PRESENT   Neuro/Psych TIA Neuromuscular disease (lumbar radiculopathy) CVA    GI/Hepatic negative GI ROS, Neg liver ROS,   Endo/Other  negative endocrine ROS  Renal/GU negative Renal ROS  negative genitourinary   Musculoskeletal  (+) Arthritis ,   Abdominal   Peds negative pediatric ROS (+)  Hematology negative hematology ROS (+)   Anesthesia Other Findings   Reproductive/Obstetrics negative OB ROS                           Anesthesia Physical Anesthesia Plan  ASA: 3  Anesthesia Plan: General   Post-op Pain Management: Tylenol PO (pre-op)* and Minimal or no pain anticipated   Induction: Intravenous  PONV Risk Score and Plan: 1 and Treatment may vary due to age or medical condition, Ondansetron and Dexamethasone  Airway Management Planned: Oral ETT  Additional Equipment: None  Intra-op Plan:   Post-operative Plan: Extubation in OR  Informed Consent: I have reviewed the patients History and Physical, chart, labs and discussed the procedure including the risks, benefits and alternatives for the proposed anesthesia with the patient or authorized representative who has indicated his/her  understanding and acceptance.     Dental advisory given  Plan Discussed with: CRNA, Anesthesiologist and Surgeon  Anesthesia Plan Comments:         Anesthesia Quick Evaluation

## 2021-06-11 NOTE — Op Note (Signed)
Video Bronchoscopy with Robotic Assisted Bronchoscopic Navigation   Date of Operation: 06/11/2021   Pre-op Diagnosis: Right upper lobe pulmonary nodule  Post-op Diagnosis: Same  Surgeon: Baltazar Apo  Assistants: None  Anesthesia: General endotracheal anesthesia  Operation: Flexible video fiberoptic bronchoscopy with robotic assistance and biopsies.  Estimated Blood Loss: Minimal  Complications: None  Indications and History: Darryl Ward is a 76 y.o. male with history of tobacco use.  He participates in lung cancer screening program and has been found to have a slowly enlarging groundglass right upper lobe pulmonary nodule.  Recommendation made to achieve tissue diagnosis via navigational bronchoscopy. The risks, benefits, complications, treatment options and expected outcomes were discussed with the patient.  The possibilities of pneumothorax, pneumonia, reaction to medication, pulmonary aspiration, perforation of a viscus, bleeding, failure to diagnose a condition and creating a complication requiring transfusion or operation were discussed with the patient who freely signed the consent.    Description of Procedure: The patient was seen in the Preoperative Area, was examined and was deemed appropriate to proceed.  The patient was taken to [facility] endoscopy room 3, identified as Darryl Ward and the procedure verified as Flexible Video Fiberoptic Bronchoscopy.  A Time Out was held and the above information confirmed.   Prior to the date of the procedure a high-resolution CT scan of the chest was performed. Utilizing ION software program a virtual tracheobronchial tree was generated to allow the creation of distinct navigation pathways to the patient's parenchymal abnormalities. After being taken to the operating room general anesthesia was initiated and the patient  was orally intubated. The video fiberoptic bronchoscope was introduced via the endotracheal tube and a general inspection  was performed which showed normal right and left lung anatomy. Aspiration of the bilateral mainstems was completed to remove any remaining secretions. Robotic catheter inserted into patient's endotracheal tube.   Target #1 right upper lobe pulmonary nodule: The distinct navigation pathways prepared prior to this procedure were then utilized to navigate to patient's lesion identified on CT scan. The robotic catheter was secured into place and the vision probe was withdrawn.  Lesion location was approximated using fluoroscopy and radial endobronchial ultrasound for peripheral targeting.  Local registration and targeting was performed using Cios three-dimensional imaging.  Under fluoroscopic guidance transbronchial brushings, transbronchial needle biopsies, and transbronchial forceps biopsies were performed to be sent for cytology and pathology.  Under fluoroscopic guidance a single fiducial marker was placed adjacent to the nodule.  At the end of the procedure a general airway inspection was performed and there was no evidence of active bleeding. The bronchoscope was removed.  The patient tolerated the procedure well. There was no significant blood loss and there were no obvious complications. A post-procedural chest x-ray is pending.  Samples Target #1: 1. Transbronchial brushings from right upper lobe pulmonary nodule 2. Transbronchial Wang needle biopsies from right upper lobe pulmonary nodule 3. Transbronchial forceps biopsies from right upper lobe pulmonary nodule   Plans:  The patient will be discharged from the PACU to home when recovered from anesthesia and after chest x-ray is reviewed. We will review the cytology, pathology and microbiology results with the patient when they become available. Outpatient followup will be with Dr. Lamonte Sakai.    Baltazar Apo, MD, PhD 06/11/2021, 12:02 PM [facility] [phone] or if no answer before 7:00PM call [phone] For any  issues after 7:00PM please call eLink [phone]

## 2021-06-11 NOTE — Transfer of Care (Signed)
Immediate Anesthesia Transfer of Care Note  Patient: Darryl Ward  Procedure(s) Performed: ROBOTIC ASSISTED NAVIGATIONAL BRONCHOSCOPY BRONCHIAL BIOPSIES BRONCHIAL BRUSHINGS BRONCHIAL NEEDLE ASPIRATION BIOPSIES VIDEO BRONCHOSCOPY WITH RADIAL ENDOBRONCHIAL ULTRASOUND FIDUCIAL MARKER PLACEMENT  Patient Location: PACU  Anesthesia Type:General  Level of Consciousness: awake and patient cooperative  Airway & Oxygen Therapy: Patient Spontanous Breathing and Patient connected to face mask oxygen  Post-op Assessment: Report given to RN, Post -op Vital signs reviewed and stable and Patient moving all extremities X 4  Post vital signs: Reviewed and stable  Last Vitals:  Vitals Value Taken Time  BP    Temp    Pulse    Resp    SpO2      Last Pain:  Vitals:   06/11/21 0817  TempSrc:   PainSc: 0-No pain         Complications: No notable events documented.

## 2021-06-12 ENCOUNTER — Encounter (HOSPITAL_COMMUNITY): Payer: Self-pay | Admitting: Emergency Medicine

## 2021-06-12 LAB — CYTOLOGY - NON PAP

## 2021-06-15 ENCOUNTER — Telehealth: Payer: Self-pay | Admitting: Emergency Medicine

## 2021-06-15 DIAGNOSIS — C3490 Malignant neoplasm of unspecified part of unspecified bronchus or lung: Secondary | ICD-10-CM

## 2021-06-15 NOTE — Telephone Encounter (Signed)
Spoke to pt & gave him appt info  Nothing further needed.

## 2021-06-15 NOTE — Telephone Encounter (Addendum)
Order for PET was placed today.  [facility] only does PET scans on Thursdays and pt would have to go next Thursday to be prior to appt with oncology on 6/6 and I don't know if they would have any openings.  I have tried to call them a couple of times and no answer.  They leave early in the afternoons.  Tried to call pt to see if he would want to go to [facility] and vm is full.  I called pt on cell # and left vm for him to call me.

## 2021-06-15 NOTE — Telephone Encounter (Signed)
Tried [facility] again and still no answer.  I really believe they are already gone for the day.  I went ahead and scheduled pt for PET at [place] 6/2 so he would at least have an appt.  Tried to call pt again and no answer.  Mailing him an appt notification for the PET appt at [facility].

## 2021-06-15 NOTE — Telephone Encounter (Signed)
Reviewed bronchoscopy results with the patient by phone. RUL nodule shows adenocarcinoma. Will refer him to oncology in [place]. Suspect he is a good candidate for SBRT. I will also order PET.

## 2021-06-21 ENCOUNTER — Other Ambulatory Visit: Payer: Self-pay | Admitting: *Deleted

## 2021-06-21 NOTE — Progress Notes (Signed)
The proposed treatment discussed in cancer conference is for discussion purpose only and is not a binding recommendation. The patient was not physically examined nor present for their treatment options. Therefore, final treatment plans cannot be decided.  ?

## 2021-06-22 ENCOUNTER — Encounter (HOSPITAL_COMMUNITY)
Admission: RE | Admit: 2021-06-22 | Discharge: 2021-06-22 | Disposition: A | Discharge status: Home / Self Care | Payer: Medicare Other | Source: Ambulatory Visit | Attending: Emergency Medicine | Admitting: Emergency Medicine

## 2021-06-22 DIAGNOSIS — I739 Peripheral vascular disease, unspecified: Secondary | ICD-10-CM | POA: Diagnosis not present

## 2021-06-22 DIAGNOSIS — C3490 Malignant neoplasm of unspecified part of unspecified bronchus or lung: Secondary | ICD-10-CM | POA: Diagnosis not present

## 2021-06-22 DIAGNOSIS — I6523 Occlusion and stenosis of bilateral carotid arteries: Secondary | ICD-10-CM | POA: Diagnosis not present

## 2021-06-22 DIAGNOSIS — I2510 Atherosclerotic heart disease of native coronary artery without angina pectoris: Secondary | ICD-10-CM | POA: Diagnosis not present

## 2021-06-22 LAB — GLUCOSE, CAPILLARY: Glucose-Capillary: 95 mg/dL (ref 70–99)

## 2021-06-22 MED ORDER — FLUDEOXYGLUCOSE F - 18 (FDG) INJECTION
8.7100 | Freq: Once | INTRAVENOUS | Status: AC | PRN
  Administered 2021-06-22: 8.71 via INTRAVENOUS

## 2021-06-26 ENCOUNTER — Encounter: Payer: Self-pay | Admitting: *Deleted

## 2021-06-26 ENCOUNTER — Inpatient Hospital Stay (HOSPITAL_COMMUNITY): Payer: Medicare Other | Attending: Hematology | Admitting: Hematology

## 2021-06-26 ENCOUNTER — Encounter (HOSPITAL_COMMUNITY): Payer: Self-pay | Admitting: Hematology

## 2021-06-26 VITALS — BP 129/76 | HR 65 | Temp 97.4°F | Resp 16 | Ht 70.0 in | Wt 167.9 lb

## 2021-06-26 DIAGNOSIS — Z8673 Personal history of transient ischemic attack (TIA), and cerebral infarction without residual deficits: Secondary | ICD-10-CM | POA: Diagnosis not present

## 2021-06-26 DIAGNOSIS — I252 Old myocardial infarction: Secondary | ICD-10-CM | POA: Insufficient documentation

## 2021-06-26 DIAGNOSIS — Z79899 Other long term (current) drug therapy: Secondary | ICD-10-CM | POA: Insufficient documentation

## 2021-06-26 DIAGNOSIS — E785 Hyperlipidemia, unspecified: Secondary | ICD-10-CM | POA: Insufficient documentation

## 2021-06-26 DIAGNOSIS — I10 Essential (primary) hypertension: Secondary | ICD-10-CM | POA: Insufficient documentation

## 2021-06-26 DIAGNOSIS — Z7982 Long term (current) use of aspirin: Secondary | ICD-10-CM | POA: Insufficient documentation

## 2021-06-26 DIAGNOSIS — I714 Abdominal aortic aneurysm, without rupture, unspecified: Secondary | ICD-10-CM | POA: Diagnosis not present

## 2021-06-26 DIAGNOSIS — C3490 Malignant neoplasm of unspecified part of unspecified bronchus or lung: Secondary | ICD-10-CM

## 2021-06-26 DIAGNOSIS — C3411 Malignant neoplasm of upper lobe, right bronchus or lung: Secondary | ICD-10-CM | POA: Diagnosis not present

## 2021-06-26 DIAGNOSIS — I2510 Atherosclerotic heart disease of native coronary artery without angina pectoris: Secondary | ICD-10-CM | POA: Insufficient documentation

## 2021-06-26 DIAGNOSIS — F17210 Nicotine dependence, cigarettes, uncomplicated: Secondary | ICD-10-CM | POA: Insufficient documentation

## 2021-06-26 DIAGNOSIS — M129 Arthropathy, unspecified: Secondary | ICD-10-CM | POA: Insufficient documentation

## 2021-06-26 DIAGNOSIS — R531 Weakness: Secondary | ICD-10-CM | POA: Insufficient documentation

## 2021-06-26 NOTE — Progress Notes (Signed)
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 76 y.o. male is here because of evaluation for lung cancer, at the request of Dr. Lamonte Sakai.  He had an abnormal lung cancer screening CT scan followed by referral to Dr. Lamonte Sakai and underwent bronchoscopy and biopsy.  Today he reports feeling well. He has lost 13 lbs since 06/11/21. He denies fevers and night sweats. His activity levels at home have been reduced since his CVA January 2022. He also reports difficulty with balance since his CVA. He reports occasional weakness in his right leg. He reports recurrent falls at which time he reports he "just falls backwards". He occasionally will use a can to walk. He reports an MI over 10 years ago. His last colonoscopy what in 04/14/2019.   Prior to retirement he worked in a [employer]. He is a smoker and has smoked 1 ppd for 60 years. He denies chemical exposure. His brother died of metastatic cancer, and a maternal uncle had lung cancer.    MEDICAL HISTORY:  Past Medical History:  Diagnosis Date   AAA (abdominal aortic aneurysm) (HCC)    Arthritis    Carotid artery disease ([place])    Nonobstructive   Cataract    Coronary atherosclerosis of native coronary artery    PTCA small diagonal 2007 otherwise nonobstructive CAD, EF 50-55%   Essential hypertension, benign    Hyperlipidemia    NSTEMI (non-ST elevated myocardial infarction) ([place]) 2007   Stroke [facility]) 2004    TIA (transient ischemic attack) 2006    SURGICAL HISTORY: Past Surgical History:  Procedure Laterality Date   AORTA - BILATERAL FEMORAL ARTERY BYPASS GRAFT  01/08/2012   Procedure: AORTA BIFEMORAL BYPASS GRAFT;  Surgeon: Elam Dutch, MD;  Location: [facility] OR;  Service: Vascular;  Laterality: Bilateral;  using 18x29mm x 40cm Hemashield Gold Vascular Graft with Endarterectomy, Thombectomy and  Reimplantation of Inferior Mesenteric Artery   BRONCHIAL BIOPSY  06/11/2021   Procedure: BRONCHIAL BIOPSIES;  Surgeon: [name], MD;  Location: [facility] ENDOSCOPY;  Service: Pulmonary;;   BRONCHIAL BRUSHINGS  06/11/2021   Procedure: BRONCHIAL BRUSHINGS;  Surgeon: [name], MD;  Location: [facility] ENDOSCOPY;  Service: Pulmonary;;   BRONCHIAL NEEDLE ASPIRATION BIOPSY  06/11/2021   Procedure: BRONCHIAL NEEDLE ASPIRATION BIOPSIES;  Surgeon: [name], MD;  Location: [facility] ENDOSCOPY;  Service: Pulmonary;;   COLONOSCOPY N/A 04/14/2019   Procedure: COLONOSCOPY;  Surgeon: Daneil Dolin, MD;  Location: [facility] ENDO SUITE;  Service: Endoscopy;  Laterality: N/A;  9:30   FIDUCIAL MARKER PLACEMENT  06/11/2021   Procedure: FIDUCIAL MARKER PLACEMENT;  Surgeon: [name], MD;  Location: [facility] ENDOSCOPY;  Service: Pulmonary;;   Left cataract surgery     POLYPECTOMY  04/14/2019   Procedure: POLYPECTOMY;  Surgeon: Daneil Dolin, MD;  Location: [facility] ENDO SUITE;  Service: Endoscopy;;   TRANSFORAMINAL LUMBAR INTERBODY FUSION (TLIF) WITH PEDICLE SCREW FIXATION 1 LEVEL N/A 04/27/2020   Procedure: Transforaminal Lumbar Interbody Fusion Lumbar Five-Sacral One;  Surgeon: Vallarie Mare, MD;  Location: [facility]  OR;  Service: Neurosurgery;  Laterality: N/A;   VIDEO BRONCHOSCOPY WITH RADIAL ENDOBRONCHIAL ULTRASOUND  06/11/2021   Procedure: VIDEO BRONCHOSCOPY WITH RADIAL ENDOBRONCHIAL ULTRASOUND;  Surgeon: [name], MD;  Location: [facility] ENDOSCOPY;  Service: Pulmonary;;    SOCIAL HISTORY: Social History   Socioeconomic History    Marital status: Divorced    Spouse name: Not on file   Number of children: 1   Years of education: 11   Highest education level: 11th grade  Occupational History    Employer: Probation officer  Tobacco Use   Smoking status: Every Day    Packs/day: 1.00    Years: 40.00    Pack years: 40.00    Types: Cigarettes   Smokeless tobacco: Never   Tobacco comments:    1 pack of cigarettes smoked daily. 05/29/21 ARJ, RN   Vaping Use   Vaping Use: Never used  Substance and Sexual Activity   Alcohol use: No    Comment: Prior history of regular alcohol use   Drug use: No   Sexual activity: Not Currently  Other Topics Concern   Not on file  Social History Narrative   Not on file   Social Determinants of Health   Financial Resource Strain: Not on file  Food Insecurity: Not on file  Transportation Needs: Not on file  Physical Activity: Not on file  Stress: Not on file  Social Connections: Not on file  Intimate Partner Violence: Not on file    FAMILY HISTORY: Family History  Problem Relation Age of Onset   Hyperlipidemia Sister    Heart attack Brother 29    ALLERGIES:  has No Known Allergies.  MEDICATIONS:  Current Outpatient Medications  Medication Sig Dispense Refill   aspirin EC 81 MG tablet Take 1 tablet (81 mg total) by mouth daily. Okay to restart this medication on 06/12/2021 90 tablet 3   Fluticasone-Umeclidin-Vilant 100-62.5-25 MCG/ACT AEPB Inhale 1 puff into the lungs daily.     LAGEVRIO 200 MG CAPS capsule SMARTSIG:4 Capsule(s) By Mouth Every 12 Hours     metoprolol succinate (TOPROL-XL) 25 MG 24 hr tablet Take 1 tablet (25 mg total) by mouth daily. 90 tablet 3   oxyCODONE-acetaminophen (PERCOCET/ROXICET) 5-325 MG tablet Take 1-2 tablets by mouth every 4 (four) hours as needed for moderate pain or severe pain. 45 tablet 0   atorvastatin (LIPITOR) 80 MG tablet Take 1 tablet (80 mg total) by mouth daily. 90 tablet 3   No current facility-administered medications for  this visit.    REVIEW OF SYSTEMS:   Review of Systems  Constitutional:  Positive for fatigue and unexpected weight change (-13 lbs). Negative for appetite change.  Respiratory:  Positive for cough.   Gastrointestinal:  Positive for nausea and vomiting.  Neurological:  Positive for dizziness and extremity weakness (R leg).  All other systems reviewed and are negative.   PHYSICAL EXAMINATION: ECOG PERFORMANCE STATUS: 1 - Symptomatic but completely ambulatory  Vitals:   06/26/21 1336  BP: 129/76  Pulse: 65  Resp: 16  Temp: (!) 97.4 F (36.3 C)  SpO2: 99%   Filed Weights   06/26/21 1336  Weight: 167 lb 14.4 oz (76.2 kg)   Physical Exam Vitals reviewed.  Constitutional:      Appearance: Normal appearance.  Cardiovascular:     Rate and Rhythm: Normal rate and regular rhythm.     Pulses: Normal pulses.     Heart sounds: Normal heart sounds.  Pulmonary:     Effort: Pulmonary  effort is normal.     Breath sounds: Normal breath sounds.  Musculoskeletal:     Right lower leg: No edema.     Left lower leg: No edema.  Lymphadenopathy:     Cervical: No cervical adenopathy.     Right cervical: No superficial cervical adenopathy.    Left cervical: No superficial cervical adenopathy.     Upper Body:     Right upper body: No supraclavicular or axillary adenopathy.     Left upper body: No supraclavicular or axillary adenopathy.     Lower Body: No right inguinal adenopathy. No left inguinal adenopathy.  Neurological:     General: No focal deficit present.     Mental Status: He is alert and oriented to person, place, and time.  Psychiatric:        Mood and Affect: Mood normal.        Behavior: Behavior normal.     LABORATORY DATA:  I have reviewed the data as listed    Latest Ref Rng & Units 06/11/2021    7:51 AM 07/13/2020   10:06 AM 04/25/2020    2:37 PM  CBC  WBC 4.0 - 10.5 K/uL 8.8   11.6   10.2    Hemoglobin 13.0 - 17.0 g/dL 12.5   13.1   13.2    Hematocrit 39.0 - 52.0  % 38.6   38.4   40.2    Platelets 150 - 400 K/uL 163   198   145        Latest Ref Rng & Units 06/11/2021    7:51 AM 07/13/2020   10:06 AM 04/25/2020    2:37 PM  CMP  Glucose 70 - 99 mg/dL 87   84   118    BUN 8 - 23 mg/dL 11   10   14     Creatinine 0.61 - 1.24 mg/dL 0.82   0.94   0.95    Sodium 135 - 145 mmol/L 137   139   138    Potassium 3.5 - 5.1 mmol/L 4.8   4.5   3.8    Chloride 98 - 111 mmol/L 110   105   108    CO2 22 - 32 mmol/L 22   20   25     Calcium 8.9 - 10.3 mg/dL 8.8   9.4   8.6    Total Protein 6.0 - 8.5 g/dL  6.8     Total Bilirubin 0.0 - 1.2 mg/dL  0.4     Alkaline Phos 44 - 121 IU/L  73     AST 0 - 40 IU/L  12     ALT 0 - 44 IU/L  7       RADIOGRAPHIC STUDIES: I have personally reviewed the radiological images as listed and agreed with the findings in the report. NM PET Image Initial (PI) Skull Base To Thigh  Result Date: 06/24/2021 CLINICAL DATA:  Initial treatment strategy for non-small cell lung cancer. Recent bronchoscopic biopsy. EXAM: NUCLEAR MEDICINE PET SKULL BASE TO THIGH TECHNIQUE: 8.71 mCi F-18 FDG was injected intravenously. Full-ring PET imaging was performed from the skull base to thigh after the radiotracer. CT data was obtained and used for attenuation correction and anatomic localization. Fasting blood glucose: 95 mg/dl COMPARISON:  Chest CT 06/08/2021 FINDINGS: Mediastinal blood pool activity: SUV max 2.22 Liver activity: SUV max NA NECK: No hypermetabolic lymph nodes in the neck. There is a focal area of hypermetabolism the left parotid gland. On the  CT scan there is a small soft tissue nodule measuring 8 mm. SUV max is 5.77. Possible malignant parotid lesion. Recommend ENT consultation. Incidental CT findings: Advanced bilateral carotid artery calcifications are noted. CHEST: The sub solid nodular lesion in the right upper lobe is hypermetabolic with SUV max of 00.93 and consistent with known neoplasm. Small fiducials are noted related to the biopsy.  Small right hilar and infrahilar lymph nodes have an SUV max of 4.03 and are concerning for metastatic adenopathy. No subcarinal or contralateral adenopathy. No metastatic pulmonary nodules are identified. Incidental CT findings: Stable severe emphysematous changes and areas of pulmonary scarring. Stable advanced atherosclerotic calcifications involving the aorta and coronary arteries. ABDOMEN/PELVIS: No abnormal hypermetabolic activity within the liver, pancreas, adrenal glands, or spleen. No hypermetabolic lymph nodes in the abdomen or pelvis. There is a hypermetabolic focus noted in the left abdomen with SUV max of 8.69. There is an adjacent 10 mm soft tissue lesion noted in the transverse colon. This does not line up with the hypermetabolic focus but I do not see any other abnormality in this area. This is likely misregistration and findings suspicious for small colon cancer. Recommend colonoscopy. Incidental CT findings: Surgical changes related to aortoiliac bypass graft. Diffuse hypermetabolism is noted which is not unexpected. SKELETON: No focal hypermetabolic activity to suggest skeletal metastasis. Incidental CT findings: none IMPRESSION: 1. Hypermetabolic right upper lobe pulmonary lesion consistent with known neoplasm. 2. Small hypermetabolic hilar and infrahilar nodes on the right side worrisome for metastatic adenopathy. 3. Small hypermetabolic left parotid gland lesion. Recommend ENT consultation. 4. Hypermetabolic focus in the left abdomen worrisome for small colon cancer or adenomatous polyp. Recommend colonoscopy. 5. No findings suspicious for abdominal/pelvic metastatic disease or osseous metastatic disease. 6. Severe/advanced vascular disease. Electronically Signed   By: Marijo Sanes M.D.   On: 06/24/2021 11:07   DG Chest Port 1 View  Result Date: 06/11/2021 CLINICAL DATA:  Status post right upper lobe bronchoscopy and biopsy. EXAM: PORTABLE CHEST 1 VIEW COMPARISON:  CT chest dated Jun 08, 2021. Chest x-ray dated February 04, 2020. FINDINGS: The heart size and mediastinal contours are within normal limits. Normal pulmonary vascularity. Increased rounded density in the peripheral right upper lobe centered on a new fiducial marker. Emphysematous changes again noted. No pleural effusion or pneumothorax. No acute osseous abnormality. IMPRESSION: 1. Increased rounded density in the peripheral right upper lobe centered on a new fiducial marker, likely a small amount of post biopsy hemorrhage. No pneumothorax. Electronically Signed   By: Titus Dubin M.D.   On: 06/11/2021 12:38   CT Super D Chest Wo Contrast  Result Date: 06/10/2021 CLINICAL DATA:  Lung mass, bronchoscopy planning * Tracking Code: BO * EXAM: CT CHEST WITHOUT CONTRAST TECHNIQUE: Multidetector CT imaging of the chest was performed using thin slice collimation for electromagnetic bronchoscopy planning purposes, without intravenous contrast. RADIATION DOSE REDUCTION: This exam was performed according to the departmental dose-optimization program which includes automated exposure control, adjustment of the mA and/or kV according to patient size and/or use of iterative reconstruction technique. COMPARISON:  04/09/2021 FINDINGS: Cardiovascular: Aortic atherosclerosis. Normal heart size. Three-vessel coronary artery calcifications. No pericardial effusion. Mediastinum/Nodes: No enlarged mediastinal, hilar, or axillary lymph nodes. Thyroid gland, trachea, and esophagus demonstrate no significant findings. Lungs/Pleura: Moderate centrilobular and paraseptal emphysema. Diffuse bilateral bronchial wall thickening. Unchanged subsolid subpleural nodule of the peripheral posterior right upper lobe measuring 1.2 x 1.1 cm (series 6, image 58). Occasional additional small pulmonary nodules are unchanged, for example  a 0.4 cm nodule of the dependent left lower lobe (series 6, image 97). Background of very fine centrilobular pulmonary nodules, most  concentrated in the lung apices. Thin walled pneumatocele of the left lower lobe (series 6, image 100). No pleural effusion or pneumothorax. Upper Abdomen: No acute abnormality. Musculoskeletal: No chest wall abnormality. No suspicious osseous lesions identified. IMPRESSION: 1. Unchanged subsolid subpleural nodule of the peripheral posterior right upper lobe measuring 1.2 x 1.1 cm. 2. Occasional additional small pulmonary nodules are unchanged, measuring 0.4 cm and smaller. 3. Background of very fine centrilobular pulmonary nodules, most concentrated in the lung apices, consistent with smoking-related respiratory bronchiolitis. 4. Emphysema and diffuse bilateral bronchial wall thickening. 5. Coronary artery disease. Aortic Atherosclerosis (ICD10-I70.0) and Emphysema (ICD10-J43.9). Electronically Signed   By: Delanna Ahmadi M.D.   On: 06/10/2021 08:32   DG C-ARM BRONCHOSCOPY  Result Date: 06/11/2021 C-ARM BRONCHOSCOPY: Fluoroscopy was utilized by the requesting physician.  No radiographic interpretation.    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  Occasional additional small pulmonary nodules measuring 0.4 cm and smaller. - Bronchoscopy (06/11/2021): RUL nodule brushing and FNA: Malignant cells with features of adenocarcinoma. - PET scan (06/22/2021): Subsolid nodular lesion in the right upper lobe, hypermetabolic SUV 15.  Small right hilar and infrahilar lymph nodes with SUV 4.03 concerning for metastatic adenopathy.  Small hypermetabolic left parotid gland lesion, consistent with previous history of Wharton's tumor.  Hypermetabolic focus in the transverse colon SUV 8.69.   Social/family history: - Lives at home with his wife.  Uses cane occasionally after he had stroke in January 2022.  He has retired after working in [employer].  He is current active smoker, 1  pack/day for 60 years.  No exposure to chemicals. - Brother died of metastatic cancer.  Maternal uncle had lung cancer.   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  We reviewed images with the patient and his wife. - Recommend brain MRI with and without contrast. - Recommend pulmonary function test. - Recommend evaluation by Dr. Roxan Hockey for surgical exploration and resection. - If he is medically inoperable, will consider definitive chemoradiation followed by durvalumab. - RTC after recommendations from Dr. Roxan Hockey.  2.  Abdominal uptake on PET scan: - Last colonoscopy on 04/14/2019 with multiple adenomatous polyps removed. - Uptake area appears to be in the transverse colon lesion. - We will make a referral to Dr. Gala Romney for colonoscopy.   All questions were answered. The patient knows to call the clinic with any problems, questions or concerns.   Derek Jack, MD, 06/26/21 4:50 PM  [place] [phone]   I, Thana Ates, am acting as a scribe for Dr. Derek Jack.  I, Derek Jack MD, have reviewed the above documentation for accuracy and completeness, and I agree with the above.

## 2021-06-26 NOTE — Patient Instructions (Addendum)
[place] at [facility] Discharge Instructions  You were seen and examined today by Dr. Delton Coombes. Dr. Delton Coombes is a medical oncologist, meaning that he specializes in the treatment of cancer diagnoses. Dr. Delton Coombes discussed your past medical history, family history of cancers, and the events that led to you being here today.  You were referred to Dr. Delton Coombes due to an abnormal biopsy. Your recent biopsy done by Dr. Lamonte Sakai revealed a common type of lung cancer known as adenocarcinoma.  You have been diagnosed with a Stage I Lung Cancer. The best course of action to cure the cancer is surgical resection (removal of the tumor).  Dr. Delton Coombes has recommended meeting with a cardiothoracic surgeon to see if you are a surgical candidate. You should also have a brain MRI and pulmonary function tests to evaluate surgical candidacy.   If you are not a surgical candidate, we will make appropriate arrangements.  Follow-up as scheduled.   Thank you for choosing [place] at [facility] to provide your oncology and hematology care.  To afford each patient quality time with our provider, please arrive at least 15 minutes before your scheduled appointment time.   If you have a lab appointment with the [facility] please come in thru the Main Entrance and check in at the main information desk.  You need to re-schedule your appointment should you arrive 10 or more minutes late.  We strive to give you quality time with our providers, and arriving late affects you and other patients whose appointments are after yours.  Also, if you no show three or more times for appointments you may be dismissed from the clinic at the providers discretion.     Again, thank you for choosing [facility].  Our hope is that these requests will decrease the amount of time that you wait before being seen by our physicians.        _____________________________________________________________  Should you have questions after your visit to [facility], please contact our office at [phone] and follow the prompts.  Our office hours are 8:00 a.m. and 4:30 p.m. Monday - Friday.  Please note that voicemails left after 4:00 p.m. may not be returned until the following business day.  We are closed weekends and major holidays.  You do have access to a nurse 24-7, just call the main number to the clinic [phone] and do not press any options, hold on the line and a nurse will answer the phone.    For prescription refill requests, have your pharmacy contact our office and allow 72 hours.    Due to Covid, you will need to wear a mask upon entering the hospital. If you do not have a mask, a mask will be given to you at the Main Entrance upon arrival. For doctor visits, patients may have 1 support person age 82 or older with them. For treatment visits, patients can not have anyone with them due to social distancing guidelines and our immunocompromised population.

## 2021-06-27 ENCOUNTER — Encounter (HOSPITAL_COMMUNITY): Payer: Self-pay

## 2021-06-27 NOTE — Progress Notes (Signed)
I met with the patient and his significant other during and following initial visit with Dr. Delton Coombes. I explained my role in the patient's care. I provided my contact information and encouraged patient and family to call with questions or concerns.

## 2021-07-03 ENCOUNTER — Ambulatory Visit (HOSPITAL_COMMUNITY)
Admission: RE | Admit: 2021-07-03 | Discharge: 2021-07-03 | Disposition: A | Discharge status: Home / Self Care | Payer: Medicare Other | Source: Ambulatory Visit | Attending: Hematology | Admitting: Hematology

## 2021-07-03 DIAGNOSIS — C3411 Malignant neoplasm of upper lobe, right bronchus or lung: Secondary | ICD-10-CM | POA: Diagnosis not present

## 2021-07-03 DIAGNOSIS — C3490 Malignant neoplasm of unspecified part of unspecified bronchus or lung: Secondary | ICD-10-CM | POA: Diagnosis not present

## 2021-07-03 LAB — PULMONARY FUNCTION TEST
DL/VA % pred: 59 %
DL/VA: 2.37 ml/min/mmHg/L
DLCO unc % pred: 47 %
DLCO unc: 11.91 ml/min/mmHg
FEF 25-75 Post: 2.17 L/sec
FEF 25-75 Pre: 1.68 L/sec
FEF2575-%Change-Post: 29 %
FEF2575-%Pred-Post: 95 %
FEF2575-%Pred-Pre: 73 %
FEV1-%Change-Post: 2 %
FEV1-%Pred-Post: 103 %
FEV1-%Pred-Pre: 100 %
FEV1-Post: 3.2 L
FEV1-Pre: 3.11 L
FEV1FVC-%Change-Post: 0 %
FEV1FVC-%Pred-Pre: 91 %
FEV6-%Change-Post: 5 %
FEV6-%Pred-Post: 114 %
FEV6-%Pred-Pre: 107 %
FEV6-Post: 4.59 L
FEV6-Pre: 4.33 L
FEV6FVC-%Change-Post: 3 %
FEV6FVC-%Pred-Post: 102 %
FEV6FVC-%Pred-Pre: 99 %
FVC-%Change-Post: 2 %
FVC-%Pred-Post: 111 %
FVC-%Pred-Pre: 108 %
FVC-Post: 4.75 L
FVC-Pre: 4.65 L
Post FEV1/FVC ratio: 67 %
Post FEV6/FVC ratio: 97 %
Pre FEV1/FVC ratio: 67 %
Pre FEV6/FVC Ratio: 93 %
RV % pred: 122 %
RV: 3.11 L
TLC % pred: 102 %
TLC: 7.19 L

## 2021-07-03 MED ORDER — ALBUTEROL SULFATE (2.5 MG/3ML) 0.083% IN NEBU
2.5000 mg | INHALATION_SOLUTION | Freq: Once | RESPIRATORY_TRACT | Status: AC
  Administered 2021-07-03: 2.5 mg via RESPIRATORY_TRACT

## 2021-07-05 ENCOUNTER — Encounter: Payer: Self-pay | Admitting: *Deleted

## 2021-07-05 NOTE — Patient Instructions (Signed)
Referring MD/PCP: Naoma Diener, NP  Procedure: Colonoscopy  Has patient had this procedure before?  Yes, Dr. Gala Romney 04/14/19  Is there a family history of colon cancer?  no  Who?  What age when diagnosed?    Is patient diabetic? If yes, Type 1 or Type 2   no      Does patient have prosthetic heart valve or mechanical valve?  no  Do you have a pacemaker/defibrillator?  no  Has patient ever had endocarditis/atrial fibrillation? no  Does patient use oxygen? no  Has patient had joint replacement within last 12 months?  no  Is patient constipated or do they take laxatives? no  Does patient have a history of alcohol/drug use?  no  Have you had a stroke/heart attack last 6 mths? no  Do you take medicine for weight loss?  no  Is patient on blood thinner such as Coumadin, Plavix and/or Aspirin? Asprin 81mg   Medications:  Current Outpatient Medications on File Prior to Visit  Medication Sig Dispense Refill   aspirin EC 81 MG tablet Take 1 tablet (81 mg total) by mouth daily. Okay to restart this medication on 06/12/2021 90 tablet 3   atorvastatin (LIPITOR) 80 MG tablet Take 1 tablet (80 mg total) by mouth daily. 90 tablet 3   Fluticasone-Umeclidin-Vilant 100-62.5-25 MCG/ACT AEPB Inhale 1 puff into the lungs daily.     LAGEVRIO 200 MG CAPS capsule SMARTSIG:4 Capsule(s) By Mouth Every 12 Hours     metoprolol succinate (TOPROL-XL) 25 MG 24 hr tablet Take 1 tablet (25 mg total) by mouth daily. 90 tablet 3   oxyCODONE-acetaminophen (PERCOCET/ROXICET) 5-325 MG tablet Take 1-2 tablets by mouth every 4 (four) hours as needed for moderate pain or severe pain. 45 tablet 0   No current facility-administered medications on file prior to visit.     Allergies: No Known Allergies

## 2021-07-11 ENCOUNTER — Telehealth: Payer: Self-pay | Admitting: *Deleted

## 2021-07-11 ENCOUNTER — Ambulatory Visit (HOSPITAL_COMMUNITY)
Admission: RE | Admit: 2021-07-11 | Discharge: 2021-07-11 | Disposition: A | Discharge status: Home / Self Care | Payer: Medicare Other | Source: Ambulatory Visit | Attending: Hematology | Admitting: Hematology

## 2021-07-11 DIAGNOSIS — C3490 Malignant neoplasm of unspecified part of unspecified bronchus or lung: Secondary | ICD-10-CM | POA: Diagnosis not present

## 2021-07-11 DIAGNOSIS — R262 Difficulty in walking, not elsewhere classified: Secondary | ICD-10-CM | POA: Diagnosis not present

## 2021-07-11 DIAGNOSIS — R200 Anesthesia of skin: Secondary | ICD-10-CM | POA: Diagnosis not present

## 2021-07-11 DIAGNOSIS — R531 Weakness: Secondary | ICD-10-CM | POA: Diagnosis not present

## 2021-07-11 MED ORDER — GADOBUTROL 1 MMOL/ML IV SOLN
7.5000 mL | Freq: Once | INTRAVENOUS | Status: AC | PRN
  Administered 2021-07-11: 7.5 mL via INTRAVENOUS

## 2021-07-11 NOTE — Telephone Encounter (Signed)
Received VM from spouse stating patient is needing a colonoscopy before his appt with Dr. Raliegh Ip on 7/6. Reports PET showed something in colon and Dr. Raliegh Ip wanted an ASAP TCS. I see we received a questionnaire back for triage colonoscopy. Dr. Raliegh Ip did epic referral on 6/6. Since triage was sent to [facility], will send message over to her

## 2021-07-11 NOTE — H&P (View-Only) (Signed)
Referring Provider: [name], NP Primary Care Physician:  [name], NP Primary GI Physician: Dr. Gala Romney  Chief Complaint  Patient presents with   Colonoscopy    HPI:   Darryl Ward is a 76 y.o. male presenting today at the request of Dr. Delton Coombes for colonoscopy.  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  He had PET scan on 06/22/2021 which showed hypermetabolic right upper lobe pulmonary lesion consistent with neoplasm, small right hilar and infrahilar lymph nodes concerning for metastatic adenopathy, small hypermetabolic left parotid gland lesion, and hypermetabolic focus in the left abdomen worrisome for small colon cancer or adenomatous polyp, appears to be in the transverse colon.  Recommended completing a colonoscopy for further evaluation of colonic lesion.  For his lung cancer, recommended brain MRI, PFTs, and evaluation by Dr. Roxan Hockey for surgical exploration and resection.  Notably, his last colonoscopy was in March 2021 with multiple adenomatous colon polyps removed and 1 hyperplastic Polyp removed.  Recommended 3-year surveillance.  Today:  Reports he has no GI concerns.  Denies change in bowel habits, constipation, diarrhea, BRBPR, melena, nausea, vomiting, abdominal pain, heartburn symptoms, or dysphagia.  He has had unintentional weight loss of about 13 pounds in the last couple of months.  Reports he has a good appetite and is eating about the same as he always has.  He has no shortness of breath, cough, or chest pain.  Reports his lung cancer was diagnosed on routine lung cancer screening.  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   Past Medical History:  Diagnosis Date   AAA (abdominal aortic aneurysm) (HCC)    Arthritis    Carotid artery disease ([place])    Nonobstructive   Cataract    Coronary atherosclerosis of native coronary artery    PTCA small diagonal 2007 otherwise nonobstructive CAD, EF  50-55%   Essential hypertension, benign    Hyperlipidemia    NSTEMI (non-ST elevated myocardial infarction) ([place]) 2007   Stroke [facility]) 2004   TIA (transient ischemic attack) 2006    Past Surgical History:  Procedure Laterality Date   AORTA - BILATERAL FEMORAL ARTERY BYPASS GRAFT  01/08/2012   Procedure: AORTA BIFEMORAL BYPASS GRAFT;  Surgeon: Elam Dutch, MD;  [facility];  Service: Vascular;  Laterality: Bilateral;  using 18x21mm x 40cm Hemashield Gold Vascular Graft with Endarterectomy, Thombectomy and  Reimplantation of Inferior Mesenteric Artery   BRONCHIAL BIOPSY  06/11/2021   Procedure: BRONCHIAL BIOPSIES;  Surgeon: [name], MD;  Location: [name] ENDOSCOPY;  Service: Pulmonary;;   BRONCHIAL BRUSHINGS  06/11/2021   Procedure: BRONCHIAL BRUSHINGS;  Surgeon: [name], MD;  Location: [facility] ENDOSCOPY;  Service: Pulmonary;;   BRONCHIAL NEEDLE ASPIRATION BIOPSY  06/11/2021   Procedure: BRONCHIAL NEEDLE ASPIRATION BIOPSIES;  Surgeon: [name], MD;  Location: [facility] ENDOSCOPY;  Service: Pulmonary;;   COLONOSCOPY N/A 04/14/2019   Procedure: COLONOSCOPY;  Surgeon: Daneil Dolin, MD;  [facility] ENDO SUITE;  Service: Endoscopy;  Laterality: N/A;  9:30   FIDUCIAL MARKER PLACEMENT  06/11/2021   Procedure: FIDUCIAL MARKER PLACEMENT;  Surgeon: [name], MD;  Location: [facility] ENDOSCOPY;  Service: Pulmonary;;   Left cataract surgery     POLYPECTOMY  04/14/2019   Procedure: POLYPECTOMY;  Surgeon: Daneil Dolin, MD;  [facility] ENDO [facility];  Service: Endoscopy;;   TRANSFORAMINAL LUMBAR INTERBODY FUSION (TLIF) WITH PEDICLE SCREW FIXATION 1 LEVEL N/A 04/27/2020   Procedure: Transforaminal Lumbar  Interbody Fusion Lumbar Five-Sacral One;  Surgeon: Vallarie Mare, MD;  Location: [place];  Service: Neurosurgery;  Laterality: N/A;   VIDEO BRONCHOSCOPY WITH RADIAL ENDOBRONCHIAL ULTRASOUND  06/11/2021   Procedure: VIDEO BRONCHOSCOPY WITH RADIAL ENDOBRONCHIAL ULTRASOUND;  Surgeon: [name], MD;  [facility] ENDOSCOPY;  Service: Pulmonary;;    Current Outpatient Medications  Medication Sig Dispense Refill   aspirin EC 81 MG tablet Take 1 tablet (81 mg total) by mouth daily. Okay to restart this medication on 06/12/2021 90 tablet 3   atorvastatin (LIPITOR) 80 MG tablet Take 1 tablet (80 mg total) by mouth daily. 90 tablet 3   metoprolol succinate (TOPROL-XL) 25 MG 24 hr tablet Take 1 tablet (25 mg total) by mouth daily. 90 tablet 3   oxyCODONE-acetaminophen (PERCOCET/ROXICET) 5-325 MG tablet Take 1-2 tablets by mouth every 4 (four) hours as needed for moderate pain or severe pain. 45 tablet 0   No current facility-administered medications for this visit.    Allergies as of 07/12/2021   (No Known Allergies)    Family History  Problem Relation Age of Onset   Hyperlipidemia Sister    Heart attack Brother 62   Cancer - Colon Neg Hx     Social History   Socioeconomic History   Marital status: Divorced    Spouse name: Not on file   Number of children: 1   Years of education: 11   Highest education level: 11th grade  Occupational History    Employer: Probation officer  Tobacco Use   Smoking status: Every Day    Packs/day: 1.00    Years: 40.00    Total pack years: 40.00    Types: Cigarettes   Smokeless tobacco: Never   Tobacco comments:    1 pack of cigarettes smoked daily. 05/29/21 ARJ, RN   Vaping Use   Vaping Use: Never used  Substance and Sexual Activity   Alcohol use: No    Comment: Prior history of regular alcohol use   Drug use: No   Sexual activity: Not Currently  Other Topics Concern   Not on file  Social History Narrative   Not on file   Social Determinants of Health   Financial Resource Strain: Low Risk  (11/29/2019)   Overall Financial Resource Strain (CARDIA)    Difficulty of Paying Living Expenses: Not hard at all  Food Insecurity: No Food Insecurity (11/29/2019)   Hunger Vital Sign    Worried About Running Out of Food in the Last  Year: Never true    Ran Out of Food in the Last Year: Never true  Transportation Needs: No Transportation Needs (11/29/2019)   PRAPARE - Hydrologist (Medical): No    Lack of Transportation (Non-Medical): No  Physical Activity: Inactive (11/29/2019)   Exercise Vital Sign    Days of Exercise per Week: 0 days    Minutes of Exercise per Session: 0 min  Stress: No Stress Concern Present (11/29/2019)   [place]    Feeling of Stress : Only a little  Social Connections: Moderately Isolated (11/29/2019)   Social Connection and Isolation Panel [NHANES]    Frequency of Communication with Friends and Family: More than three times a week    Frequency of Social Gatherings with Friends and Family: More than three times a week    Attends Religious Services: 1 to 4 times per year    Active Member of [employer] or Organizations:  No    Attends Archivist Meetings: Never    Marital Status: Divorced    Review of Systems: Gen: Denies fever, chills, anorexia. Denies fatigue, weakness, weight loss.  CV: Denies chest pain, palpitations, syncope, peripheral edema, and claudication. Resp: Denies dyspnea at rest, cough, wheezing, coughing up blood, and pleurisy. GI: Denies vomiting blood, jaundice, and fecal incontinence.   Denies dysphagia or odynophagia. Derm: Denies rash, itching, dry skin Psych: Denies depression, anxiety, memory loss, confusion. No homicidal or suicidal ideation.  Heme: Denies bruising, bleeding, and enlarged lymph nodes.  Physical Exam: BP (!) 143/73   Pulse 74   Temp 97.7 F (36.5 C) (Temporal)   Ht 5\' 7"  (1.702 m)   Wt 174 lb 9.6 oz (79.2 kg)   BMI 27.35 kg/m  General:   Alert and oriented. No distress noted. Pleasant and cooperative.  Head:  Normocephalic and atraumatic. Eyes:  Conjuctiva clear without scleral icterus. Heart:  S1, S2 present without murmurs appreciated. Lungs:   Clear to auscultation bilaterally. No wheezes, rales, or rhonchi. No distress.  Abdomen:  +BS, soft, non-tender and non-distended. No rebound or guarding. No HSM or masses noted. Msk:  Symmetrical without gross deformities. Normal posture. Extremities:  Without edema. Neurologic:  Alert and  oriented x4 Psych:  Normal mood and affect.    Assessment:  76 year old male presenting today at the request of Dr. Delton Coombes for colonoscopy.  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  He had PET scan on 06/22/2021 which showed hypermetabolic right upper lobe pulmonary lesion consistent with neoplasm, small right hilar and infrahilar lymph nodes concerning for metastatic adenopathy, small hypermetabolic left parotid gland lesion, and hypermetabolic focus in the left abdomen worrisome for small colon cancer or adenomatous polyp, appears to be in the transverse colon.  Recommended completing a colonoscopy for further evaluation of colonic lesion. His last colonoscopy was in March 2021 with multiple adenomatous colon polyps removed, 1 hyperplastic polyp removed with recommendations for 3-year surveillance.  Currently without any significant GI symptoms.  Denies BRBPR, melena. Per chart review, it appears he had lost about 13 pounds between May 22nd and June 6th, but has gained about 7 pounds back.  Unclear if this weight discrepancy is accurate.   We will plan to proceed with colonoscopy ASAP.  As Dr. Gala Romney does not have any availabilities until August, we will schedule with Dr. Abbey Chatters.  Plan:  Proceed with colonoscopy with propofol by Dr. Abbey Chatters in near future. The risks, benefits, and alternatives have been discussed with the patient in detail. The patient states understanding and desires to proceed. ASA 3 Follow-up per Dr. Ave Filter recommendations.   Aliene Altes, PA-C [facility] Gastroenterology 07/12/2021

## 2021-07-11 NOTE — Telephone Encounter (Signed)
Please schedule appointment Darryl Ward thanks

## 2021-07-11 NOTE — Telephone Encounter (Signed)
Let's get him an asap date for colonoscopy. ASA 3. He is a [facility] patient.  He will need an office visit or virtual visit prior to his procedure since this is not a screening study.

## 2021-07-11 NOTE — Progress Notes (Unsigned)
Referring Provider: [name], NP Primary Care Physician:  [name], NP Primary GI Physician: Dr. Gala Romney  No chief complaint on file.   HPI:   Darryl Ward is a 76 y.o. male presenting today at the request of Dr. Delton Coombes for colonoscopy.  He was diagnosed with stage II right upper lobe adenocarcinoma.  He had PET scan on 06/22/2021 which showed hypermetabolic right upper lobe pulmonary lesion consistent with neoplasm, small right hilar and infrahilar lymph nodes concerning for metastatic adenopathy, small hypermetabolic left parotid gland lesion, and hypermetabolic focus in the left abdomen worrisome for small colon cancer or adenomatous polyp, appears to be in the transverse colon.  Recommended completing a colonoscopy for further evaluation of colonic lesion.  For his lung cancer, recommended brain MRI, PFTs, and evaluation by Dr. Roxan Hockey for surgical exploration and resection.  Notably, his last colonoscopy was in March 2021 with multiple adenomatous colon polyps removed and 1 hyperplastic Polyp removed.  Recommended 3-year surveillance.  Today:    Past Medical History:  Diagnosis Date   AAA (abdominal aortic aneurysm) ([place])    Arthritis    Carotid artery disease ([place])    Nonobstructive   Cataract    Coronary atherosclerosis of native coronary artery    PTCA small diagonal 2007 otherwise nonobstructive CAD, EF 50-55%   Essential hypertension, benign    Hyperlipidemia    NSTEMI (non-ST elevated myocardial infarction) ([place]) 2007   Stroke [facility]) 2004   TIA (transient ischemic attack) 2006    Past Surgical History:  Procedure Laterality Date   AORTA - BILATERAL FEMORAL ARTERY BYPASS GRAFT  01/08/2012   Procedure: AORTA BIFEMORAL BYPASS GRAFT;  Surgeon: Elam Dutch, MD;  [facility]: [facility] OR;  Service: Vascular;  Laterality: Bilateral;  using 18x58mm x 40cm Hemashield Gold Vascular Graft with Endarterectomy, Thombectomy and  Reimplantation of Inferior  Mesenteric Artery   BRONCHIAL BIOPSY  06/11/2021   Procedure: BRONCHIAL BIOPSIES;  Surgeon: [name], MD;  Location: [facility] ENDOSCOPY;  Service: Pulmonary;;   BRONCHIAL BRUSHINGS  06/11/2021   Procedure: BRONCHIAL BRUSHINGS;  Surgeon: [name], MD;  Location[facility] ENDOSCOPY;  Service: Pulmonary;;   BRONCHIAL NEEDLE ASPIRATION BIOPSY  06/11/2021   Procedure: BRONCHIAL NEEDLE ASPIRATION BIOPSIES;  Surgeon: [name], MD;  Location: [facility] ENDOSCOPY;  Service: Pulmonary;;   COLONOSCOPY N/A 04/14/2019   Procedure: COLONOSCOPY;  Surgeon: Daneil Dolin, MD;  [facility] ENDO [facility];  Service: Endoscopy;  Laterality: N/A;  9:30   FIDUCIAL MARKER PLACEMENT  06/11/2021   Procedure: FIDUCIAL MARKER PLACEMENT;  Surgeon: [name], MD;  Location: [facility] ENDOSCOPY;  Service: Pulmonary;;   Left cataract surgery     POLYPECTOMY  04/14/2019   Procedure: POLYPECTOMY;  Surgeon: Daneil Dolin, MD;  [facility] ENDO [facility];  Service: Endoscopy;;   TRANSFORAMINAL LUMBAR INTERBODY FUSION (TLIF) WITH PEDICLE SCREW FIXATION 1 LEVEL N/A 04/27/2020   Procedure: Transforaminal Lumbar Interbody Fusion Lumbar Five-Sacral One;  Surgeon: Vallarie Mare, MD;  Location: [place];  Service: Neurosurgery;  Laterality: N/A;   VIDEO BRONCHOSCOPY WITH RADIAL ENDOBRONCHIAL ULTRASOUND  06/11/2021   Procedure: VIDEO BRONCHOSCOPY WITH RADIAL ENDOBRONCHIAL ULTRASOUND;  Surgeon: [name], MD;  Location: [facility] ENDOSCOPY;  Service: Pulmonary;;    Current Outpatient Medications  Medication Sig Dispense Refill   aspirin EC 81 MG tablet Take 1 tablet (81 mg total) by mouth daily. Okay to restart this medication on 06/12/2021 90 tablet 3   atorvastatin (LIPITOR) 80 MG  tablet Take 1 tablet (80 mg total) by mouth daily. 90 tablet 3   Fluticasone-Umeclidin-Vilant 100-62.5-25 MCG/ACT AEPB Inhale 1 puff into the lungs daily.     LAGEVRIO 200 MG CAPS capsule SMARTSIG:4 Capsule(s) By Mouth Every 12 Hours     metoprolol succinate  (TOPROL-XL) 25 MG 24 hr tablet Take 1 tablet (25 mg total) by mouth daily. 90 tablet 3   oxyCODONE-acetaminophen (PERCOCET/ROXICET) 5-325 MG tablet Take 1-2 tablets by mouth every 4 (four) hours as needed for moderate pain or severe pain. 45 tablet 0   No current facility-administered medications for this visit.    Allergies as of 07/12/2021   (No Known Allergies)    Family History  Problem Relation Age of Onset   Hyperlipidemia Sister    Heart attack Brother 26    Social History   Socioeconomic History   Marital status: Divorced    Spouse name: Not on file   Number of children: 1   Years of education: 11   Highest education level: 11th grade  Occupational History    Employer: Probation officer  Tobacco Use   Smoking status: Every Day    Packs/day: 1.00    Years: 40.00    Total pack years: 40.00    Types: Cigarettes   Smokeless tobacco: Never   Tobacco comments:    1 pack of cigarettes smoked daily. 05/29/21 ARJ, RN   Vaping Use   Vaping Use: Never used  Substance and Sexual Activity   Alcohol use: No    Comment: Prior history of regular alcohol use   Drug use: No   Sexual activity: Not Currently  Other Topics Concern   Not on file  Social History Narrative   Not on file   Social Determinants of Health   Financial Resource Strain: Low Risk  (11/29/2019)   Overall Financial Resource Strain (CARDIA)    Difficulty of Paying Living Expenses: Not hard at all  Food Insecurity: No Food Insecurity (11/29/2019)   Hunger Vital Sign    Worried About Running Out of Food in the Last Year: Never true    Ran Out of Food in the Last Year: Never true  Transportation Needs: No Transportation Needs (11/29/2019)   PRAPARE - Hydrologist (Medical): No    Lack of Transportation (Non-Medical): No  Physical Activity: Inactive (11/29/2019)   Exercise Vital Sign    Days of Exercise per Week: 0 days    Minutes of Exercise per Session: 0 min  Stress: No  Stress Concern Present (11/29/2019)   [place]    Feeling of Stress : Only a little  Social Connections: Moderately Isolated (11/29/2019)   Social Connection and Isolation Panel [NHANES]    Frequency of Communication with Friends and Family: More than three times a week    Frequency of Social Gatherings with Friends and Family: More than three times a week    Attends Religious Services: 1 to 4 times per year    Active Member of [employer] or Organizations: No    Attends Archivist Meetings: Never    Marital Status: Divorced    Review of Systems: Gen: Denies fever, chills, anorexia. Denies fatigue, weakness, weight loss.  CV: Denies chest pain, palpitations, syncope, peripheral edema, and claudication. Resp: Denies dyspnea at rest, cough, wheezing, coughing up blood, and pleurisy. GI: Denies vomiting blood, jaundice, and fecal incontinence.   Denies dysphagia or odynophagia. Derm: Denies rash, itching,  dry skin Psych: Denies depression, anxiety, memory loss, confusion. No homicidal or suicidal ideation.  Heme: Denies bruising, bleeding, and enlarged lymph nodes.  Physical Exam: There were no vitals taken for this visit. General:   Alert and oriented. No distress noted. Pleasant and cooperative.  Head:  Normocephalic and atraumatic. Eyes:  Conjuctiva clear without scleral icterus. Heart:  S1, S2 present without murmurs appreciated. Lungs:  Clear to auscultation bilaterally. No wheezes, rales, or rhonchi. No distress.  Abdomen:  +BS, soft, non-tender and non-distended. No rebound or guarding. No HSM or masses noted. Msk:  Symmetrical without gross deformities. Normal posture. Extremities:  Without edema. Neurologic:  Alert and  oriented x4 Psych:  Normal mood and affect.    Assessment:     Plan:  ***   Aliene Altes, PA-C [facility] Gastroenterology 07/12/2021

## 2021-07-12 ENCOUNTER — Encounter: Payer: Self-pay | Admitting: *Deleted

## 2021-07-12 ENCOUNTER — Encounter: Payer: Self-pay | Admitting: Gastroenterology

## 2021-07-12 ENCOUNTER — Ambulatory Visit (INDEPENDENT_AMBULATORY_CARE_PROVIDER_SITE_OTHER): Payer: Medicare Other | Admitting: Gastroenterology

## 2021-07-12 VITALS — BP 143/73 | HR 74 | Temp 97.7°F | Ht 67.0 in | Wt 174.6 lb

## 2021-07-12 DIAGNOSIS — R948 Abnormal results of function studies of other organs and systems: Secondary | ICD-10-CM | POA: Insufficient documentation

## 2021-07-12 DIAGNOSIS — Z86010 Personal history of colonic polyps: Secondary | ICD-10-CM

## 2021-07-12 NOTE — Patient Instructions (Signed)
We will arrange you to have colonoscopy in the near future with Dr. Abbey Chatters.  We will follow-up with you as Dr. Abbey Chatters recommends.  It was nice meeting you today!   Aliene Altes, PA-C [facility] Gastroenterology

## 2021-07-16 ENCOUNTER — Encounter: Payer: Self-pay | Admitting: Thoracic Surgery (Cardiothoracic Vascular Surgery)

## 2021-07-16 ENCOUNTER — Ambulatory Visit (INDEPENDENT_AMBULATORY_CARE_PROVIDER_SITE_OTHER): Payer: Medicare Other | Admitting: Thoracic Surgery (Cardiothoracic Vascular Surgery)

## 2021-07-16 VITALS — BP 137/80 | HR 72 | Resp 20 | Ht 67.0 in | Wt 170.5 lb

## 2021-07-16 DIAGNOSIS — C3411 Malignant neoplasm of upper lobe, right bronchus or lung: Secondary | ICD-10-CM

## 2021-07-16 DIAGNOSIS — M5416 Radiculopathy, lumbar region: Secondary | ICD-10-CM | POA: Diagnosis not present

## 2021-07-17 ENCOUNTER — Ambulatory Visit (INDEPENDENT_AMBULATORY_CARE_PROVIDER_SITE_OTHER): Payer: Medicare Other | Admitting: Emergency Medicine

## 2021-07-17 ENCOUNTER — Encounter: Payer: Self-pay | Admitting: Emergency Medicine

## 2021-07-17 DIAGNOSIS — C3411 Malignant neoplasm of upper lobe, right bronchus or lung: Secondary | ICD-10-CM | POA: Diagnosis not present

## 2021-07-17 DIAGNOSIS — Z720 Tobacco use: Secondary | ICD-10-CM

## 2021-07-17 DIAGNOSIS — J449 Chronic obstructive pulmonary disease, unspecified: Secondary | ICD-10-CM | POA: Diagnosis not present

## 2021-07-17 NOTE — Assessment & Plan Note (Signed)
Not on scheduled bronchodilator therapy.  We will continue to follow symptoms and consider starting going forward depending on his functional capacity.

## 2021-07-20 ENCOUNTER — Encounter (HOSPITAL_COMMUNITY)
Admission: RE | Admit: 2021-07-20 | Discharge: 2021-07-20 | Disposition: A | Discharge status: Home / Self Care | Payer: Medicare Other | Source: Ambulatory Visit | Attending: Internal Medicine | Admitting: Internal Medicine

## 2021-07-20 NOTE — Progress Notes (Signed)
Patient was seen in ov, scheduled next week.

## 2021-07-23 ENCOUNTER — Encounter: Payer: Medicare Other | Admitting: Thoracic Surgery (Cardiothoracic Vascular Surgery)

## 2021-07-25 ENCOUNTER — Other Ambulatory Visit: Payer: Self-pay

## 2021-07-25 ENCOUNTER — Ambulatory Visit (HOSPITAL_COMMUNITY)
Admission: RE | Admit: 2021-07-25 | Discharge: 2021-07-25 | Disposition: A | Discharge status: Home / Self Care | Payer: Medicare Other | Attending: Internal Medicine | Admitting: Internal Medicine

## 2021-07-25 ENCOUNTER — Encounter (HOSPITAL_COMMUNITY)
Admission: RE | Disposition: A | Discharge status: Home / Self Care | Payer: Self-pay | Source: Home / Self Care | Attending: Internal Medicine

## 2021-07-25 ENCOUNTER — Encounter (HOSPITAL_COMMUNITY): Payer: Self-pay

## 2021-07-25 ENCOUNTER — Ambulatory Visit (HOSPITAL_COMMUNITY): Payer: Medicare Other | Admitting: Anesthesiology

## 2021-07-25 ENCOUNTER — Ambulatory Visit (HOSPITAL_BASED_OUTPATIENT_CLINIC_OR_DEPARTMENT_OTHER): Payer: Medicare Other | Admitting: Anesthesiology

## 2021-07-25 DIAGNOSIS — R948 Abnormal results of function studies of other organs and systems: Secondary | ICD-10-CM | POA: Diagnosis present

## 2021-07-25 DIAGNOSIS — R933 Abnormal findings on diagnostic imaging of other parts of digestive tract: Secondary | ICD-10-CM | POA: Insufficient documentation

## 2021-07-25 DIAGNOSIS — K648 Other hemorrhoids: Secondary | ICD-10-CM

## 2021-07-25 DIAGNOSIS — Z8673 Personal history of transient ischemic attack (TIA), and cerebral infarction without residual deficits: Secondary | ICD-10-CM | POA: Insufficient documentation

## 2021-07-25 DIAGNOSIS — Z86010 Personal history of colonic polyps: Secondary | ICD-10-CM | POA: Insufficient documentation

## 2021-07-25 DIAGNOSIS — J984 Other disorders of lung: Secondary | ICD-10-CM | POA: Diagnosis not present

## 2021-07-25 DIAGNOSIS — D120 Benign neoplasm of cecum: Secondary | ICD-10-CM | POA: Diagnosis not present

## 2021-07-25 DIAGNOSIS — J449 Chronic obstructive pulmonary disease, unspecified: Secondary | ICD-10-CM

## 2021-07-25 DIAGNOSIS — D124 Benign neoplasm of descending colon: Secondary | ICD-10-CM | POA: Insufficient documentation

## 2021-07-25 DIAGNOSIS — I2510 Atherosclerotic heart disease of native coronary artery without angina pectoris: Secondary | ICD-10-CM | POA: Insufficient documentation

## 2021-07-25 DIAGNOSIS — Z79891 Long term (current) use of opiate analgesic: Secondary | ICD-10-CM | POA: Diagnosis not present

## 2021-07-25 DIAGNOSIS — M1990 Unspecified osteoarthritis, unspecified site: Secondary | ICD-10-CM | POA: Insufficient documentation

## 2021-07-25 DIAGNOSIS — D123 Benign neoplasm of transverse colon: Secondary | ICD-10-CM | POA: Insufficient documentation

## 2021-07-25 DIAGNOSIS — I10 Essential (primary) hypertension: Secondary | ICD-10-CM | POA: Diagnosis not present

## 2021-07-25 DIAGNOSIS — D125 Benign neoplasm of sigmoid colon: Secondary | ICD-10-CM | POA: Diagnosis not present

## 2021-07-25 DIAGNOSIS — F17210 Nicotine dependence, cigarettes, uncomplicated: Secondary | ICD-10-CM

## 2021-07-25 DIAGNOSIS — C3411 Malignant neoplasm of upper lobe, right bronchus or lung: Secondary | ICD-10-CM | POA: Insufficient documentation

## 2021-07-25 DIAGNOSIS — D122 Benign neoplasm of ascending colon: Secondary | ICD-10-CM | POA: Insufficient documentation

## 2021-07-25 DIAGNOSIS — I252 Old myocardial infarction: Secondary | ICD-10-CM | POA: Diagnosis not present

## 2021-07-25 DIAGNOSIS — K635 Polyp of colon: Secondary | ICD-10-CM

## 2021-07-25 HISTORY — PX: POLYPECTOMY: SHX5525

## 2021-07-25 HISTORY — PX: COLONOSCOPY WITH PROPOFOL: SHX5780

## 2021-07-25 SURGERY — COLONOSCOPY WITH PROPOFOL
Anesthesia: General

## 2021-07-25 MED ORDER — LACTATED RINGERS IV SOLN: INTRAVENOUS | Status: DC

## 2021-07-25 MED ORDER — PROPOFOL 10 MG/ML IV BOLUS
INTRAVENOUS | Status: DC | PRN
  Administered 2021-07-25: 40 mg via INTRAVENOUS
  Administered 2021-07-25: 100 mg via INTRAVENOUS
  Administered 2021-07-25 (×2): 30 mg via INTRAVENOUS
  Administered 2021-07-25: 40 mg via INTRAVENOUS
  Administered 2021-07-25 (×2): 30 mg via INTRAVENOUS

## 2021-07-25 MED ORDER — LIDOCAINE HCL (CARDIAC) PF 100 MG/5ML IV SOSY
PREFILLED_SYRINGE | INTRAVENOUS | Status: DC | PRN
  Administered 2021-07-25: 50 mg via INTRAVENOUS

## 2021-07-25 NOTE — Anesthesia Preprocedure Evaluation (Signed)
Anesthesia Evaluation  Patient identified by MRN, date of birth, ID band Patient awake    Reviewed: Allergy & Precautions, NPO status , Patient's Chart, lab work & pertinent test results, reviewed documented beta blocker date and time   History of Anesthesia Complications Negative for: history of anesthetic complications  Airway Mallampati: II  TM Distance: >3 FB Neck ROM: Full    Dental  (+) Dental Advisory Given, Upper Dentures, Lower Dentures   Pulmonary COPD, Current Smoker,  Right lung carcinoma   Pulmonary exam normal breath sounds clear to auscultation       Cardiovascular Exercise Tolerance: Good hypertension, Pt. on medications and Pt. on home beta blockers + CAD, + Past MI and + Peripheral Vascular Disease  Normal cardiovascular exam Rhythm:Regular Rate:Normal     Neuro/Psych Lumbar radiculopathy Lumbar spinal stenosis Meningioma, cerebral (HCC)   TIA Neuromuscular disease CVA (right hemiparesis), Residual Symptoms negative psych ROS   GI/Hepatic negative GI ROS, Neg liver ROS,   Endo/Other  negative endocrine ROS  Renal/GU negative Renal ROS  negative genitourinary   Musculoskeletal  (+) Arthritis , Osteoarthritis,    Abdominal   Peds negative pediatric ROS (+)  Hematology negative hematology ROS (+)   Anesthesia Other Findings   Reproductive/Obstetrics negative OB ROS                            Anesthesia Physical Anesthesia Plan  ASA: 3  Anesthesia Plan: General   Post-op Pain Management: Minimal or no pain anticipated   Induction: Intravenous  PONV Risk Score and Plan: Propofol infusion  Airway Management Planned: Nasal Cannula and Natural Airway  Additional Equipment:   Intra-op Plan:   Post-operative Plan:   Informed Consent: I have reviewed the patients History and Physical, chart, labs and discussed the procedure including the risks, benefits and  alternatives for the proposed anesthesia with the patient or authorized representative who has indicated his/her understanding and acceptance.     Dental advisory given  Plan Discussed with: CRNA  Anesthesia Plan Comments:        Anesthesia Quick Evaluation

## 2021-07-25 NOTE — Anesthesia Procedure Notes (Signed)
Date/Time: 07/25/2021 12:14 PM  Performed by: Orlie Dakin, CRNAPre-anesthesia Checklist: Patient identified, Emergency Drugs available, Suction available and Patient being monitored Patient Re-evaluated:Patient Re-evaluated prior to induction Oxygen Delivery Method: Nasal cannula Induction Type: IV induction Placement Confirmation: positive ETCO2

## 2021-07-25 NOTE — Transfer of Care (Signed)
Immediate Anesthesia Transfer of Care Note  Patient: Darryl Ward  Procedure(s) Performed: COLONOSCOPY WITH PROPOFOL POLYPECTOMY  Patient Location: Short Stay  Anesthesia Type:General  Level of Consciousness: drowsy  Airway & Oxygen Therapy: Patient Spontanous Breathing  Post-op Assessment: Report given to RN and Post -op Vital signs reviewed and stable  Post vital signs: Reviewed and stable  Last Vitals:  Vitals Value Taken Time  BP    Temp    Pulse    Resp    SpO2      Last Pain:  Vitals:   07/25/21 1208  TempSrc:   PainSc: 0-No pain      Patients Stated Pain Goal: 5 (36/43/83 7793)  Complications: No notable events documented.

## 2021-07-25 NOTE — Interval H&P Note (Signed)
History and Physical Interval Note:  07/25/2021 11:30 AM  Darryl Ward  has presented today for surgery, with the diagnosis of abnormal PET scan, hx colon polyps.  The various methods of treatment have been discussed with the patient and family. After consideration of risks, benefits and other options for treatment, the patient has consented to  Procedure(s) with comments: COLONOSCOPY WITH PROPOFOL (N/A) - 1:00pm as a surgical intervention.  The patient's history has been reviewed, patient examined, no change in status, stable for surgery.  I have reviewed the patient's chart and labs.  Questions were answered to the patient's satisfaction.     Eloise Harman

## 2021-07-25 NOTE — Discharge Instructions (Signed)
  Colonoscopy Discharge Instructions  Read the instructions outlined below and refer to this sheet in the next few weeks. These discharge instructions provide you with general information on caring for yourself after you leave the hospital. Your doctor may also give you specific instructions. While your treatment has been planned according to the most current medical practices available, unavoidable complications occasionally occur.   ACTIVITY You may resume your regular activity, but move at a slower pace for the next 24 hours.  Take frequent rest periods for the next 24 hours.  Walking will help get rid of the air and reduce the bloated feeling in your belly (abdomen).  No driving for 24 hours (because of the medicine (anesthesia) used during the test).   Do not sign any important legal documents or operate any machinery for 24 hours (because of the anesthesia used during the test).  NUTRITION Drink plenty of fluids.  You may resume your normal diet as instructed by your doctor.  Begin with a light meal and progress to your normal diet. Heavy or fried foods are harder to digest and may make you feel sick to your stomach (nauseated).  Avoid alcoholic beverages for 24 hours or as instructed.  MEDICATIONS You may resume your normal medications unless your doctor tells you otherwise.  WHAT YOU CAN EXPECT TODAY Some feelings of bloating in the abdomen.  Passage of more gas than usual.  Spotting of blood in your stool or on the toilet paper.  IF YOU HAD POLYPS REMOVED DURING THE COLONOSCOPY: No aspirin products for 7 days or as instructed.  No alcohol for 7 days or as instructed.  Eat a soft diet for the next 24 hours.  FINDING OUT THE RESULTS OF YOUR TEST Not all test results are available during your visit. If your test results are not back during the visit, make an appointment with your caregiver to find out the results. Do not assume everything is normal if you have not heard from your  caregiver or the medical facility. It is important for you to follow up on all of your test results.  SEEK IMMEDIATE MEDICAL ATTENTION IF: You have more than a spotting of blood in your stool.  Your belly is swollen (abdominal distention).  You are nauseated or vomiting.  You have a temperature over 101.  You have abdominal pain or discomfort that is severe or gets worse throughout the day.   Your colonoscopy revealed 18 polyp(s) which I removed successfully. Await pathology results, my office will contact you. I recommend repeating colonoscopy in 1 year for surveillance purposes. Otherwise follow up with GI as needed.    I hope you have a great rest of your week!  Elon Alas. Abbey Chatters, D.O. Gastroenterology and Hepatology [facility]

## 2021-07-25 NOTE — Anesthesia Postprocedure Evaluation (Signed)
Anesthesia Post Note  Patient: Darryl Ward  Procedure(s) Performed: COLONOSCOPY WITH PROPOFOL POLYPECTOMY  Patient location during evaluation: Phase II Anesthesia Type: General Level of consciousness: awake and alert and oriented Pain management: pain level controlled Vital Signs Assessment: post-procedure vital signs reviewed and stable Respiratory status: spontaneous breathing, nonlabored ventilation and respiratory function stable Cardiovascular status: blood pressure returned to baseline and stable Postop Assessment: no apparent nausea or vomiting Anesthetic complications: no   No notable events documented.   Last Vitals:  Vitals:   07/25/21 1136 07/25/21 1249  BP: 136/79 118/65  Pulse: 82 64  Resp: 16 18  Temp: 36.6 C (!) 36.4 C  SpO2:  95%    Last Pain:  Vitals:   07/25/21 1249  TempSrc: Oral  PainSc: 0-No pain                 Jahvier Aldea C Lila Lufkin

## 2021-07-25 NOTE — Op Note (Signed)
[facility] Patient Name: Darryl Ward Procedure Date: 07/25/2021 12:00 PM MRN: [id number] Date of Birth: 1945-06-16 Attending MD: Elon Alas. Abbey Chatters DO CSN: [id number] Age: 76 Admit Type: Outpatient Procedure:                Colonoscopy Indications:              Abnormal PET scan of the GI tract Providers:                Elon Alas. Abbey Chatters, DO, Caprice Kluver, Gwynneth Albright RN, RN Referring MD:              Medicines:                See the Anesthesia note for documentation of the                            administered medications Complications:            No immediate complications. Estimated Blood Loss:     Estimated blood loss was minimal. Procedure:                Pre-Anesthesia Assessment:                           - The anesthesia plan was to use monitored                            anesthesia care (MAC).                           After obtaining informed consent, the colonoscope                            was passed under direct vision. Throughout the                            procedure, the patient's blood pressure, pulse, and                            oxygen saturations were monitored continuously. The                            PCF-HQ190L ([id number]) scope was introduced through                            the anus and advanced to the the cecum, identified                            by appendiceal orifice and ileocecal valve. The                            colonoscopy was performed without difficulty. The                            patient tolerated the  procedure well. The quality                            of the bowel preparation was evaluated using the                            BBPS [place] [facility] Bowel Preparation Scale) with scores                            of: Right Colon = 2 (minor amount of residual                            staining, small fragments of stool and/or opaque                            liquid, but mucosa seen well),  Transverse Colon = 2                            (minor amount of residual staining, small fragments                            of stool and/or opaque liquid, but mucosa seen                            well) and Left Colon = 2 (minor amount of residual                            staining, small fragments of stool and/or opaque                            liquid, but mucosa seen well). The total BBPS score                            equals 6. The quality of the bowel preparation was                            fair. Scope In: 12:12:35 PM Scope Out: 12:44:08 PM Scope Withdrawal Time: 0 hours 29 minutes 52 seconds  Total Procedure Duration: 0 hours 31 minutes 33 seconds  Findings:      The perianal and digital rectal examinations were normal.      Non-bleeding internal hemorrhoids were found during endoscopy.      Four sessile polyps were found in the ascending colon and cecum. The       polyps were 5 to 7 mm in size. These polyps were removed with a cold       snare. Resection and retrieval were complete.      Five sessile polyps were found in the transverse colon. The polyps were       6 to 12 mm in size. These polyps were removed with a cold snare.       Resection and retrieval were complete.      Seven sessile polyps were found in the descending colon. The polyps were       6 to 10 mm in  size. These polyps were removed with a cold snare.       Resection and retrieval were complete.      Two pedunculated polyps were found in the sigmoid colon. The polyps were       10 to 13 mm in size. These polyps were removed with a cold snare.       Resection and retrieval were complete. Impression:               - Preparation of the colon was fair.                           - Non-bleeding internal hemorrhoids.                           - Four 5 to 7 mm polyps in the ascending colon and                            in the cecum, removed with a cold snare. Resected                            and  retrieved.                           - Five 6 to 12 mm polyps in the transverse colon,                            removed with a cold snare. Resected and retrieved.                           - Seven 6 to 10 mm polyps in the descending colon,                            removed with a cold snare. Resected and retrieved.                           - Two 10 to 13 mm polyps in the sigmoid colon,                            removed with a cold snare. Resected and retrieved. Moderate Sedation:      Per Anesthesia Care Recommendation:           - Patient has a contact number available for                            emergencies. The signs and symptoms of potential                            delayed complications were discussed with the                            patient. Return to normal activities tomorrow.                            Written discharge instructions were  provided to the                            patient.                           - Resume previous diet.                           - Continue present medications.                           - Await pathology results.                           - Repeat colonoscopy in 1 year for surveillance.                           - Return to GI clinic PRN. Procedure Code(s):        --- Professional ---                           [phone], Colonoscopy, flexible; with removal of                            tumor(s), polyp(s), or other lesion(s) by snare                            technique Diagnosis Code(s):        --- Professional ---                           K64.8, Other hemorrhoids                           K63.5, Polyp of colon                           R93.3, Abnormal findings on diagnostic imaging of                            other parts of digestive tract CPT copyright 2019 American Medical Association. All rights reserved. The codes documented in this report are preliminary and upon coder review may  be revised to meet current compliance  requirements. Elon Alas. Abbey Chatters, DO [place] Abbey Chatters, DO 07/25/2021 12:48:24 PM This report has been signed electronically. Number of Addenda: 0

## 2021-07-26 ENCOUNTER — Inpatient Hospital Stay (HOSPITAL_COMMUNITY): Payer: Medicare Other | Attending: Hematology | Admitting: Hematology

## 2021-07-26 ENCOUNTER — Encounter (HOSPITAL_COMMUNITY): Payer: Self-pay | Admitting: Hematology

## 2021-07-26 ENCOUNTER — Inpatient Hospital Stay (HOSPITAL_COMMUNITY): Payer: Medicare Other

## 2021-07-26 VITALS — BP 129/62 | HR 70 | Temp 97.9°F | Resp 16 | Wt 168.8 lb

## 2021-07-26 DIAGNOSIS — M549 Dorsalgia, unspecified: Secondary | ICD-10-CM | POA: Insufficient documentation

## 2021-07-26 DIAGNOSIS — E785 Hyperlipidemia, unspecified: Secondary | ICD-10-CM | POA: Insufficient documentation

## 2021-07-26 DIAGNOSIS — F17210 Nicotine dependence, cigarettes, uncomplicated: Secondary | ICD-10-CM | POA: Insufficient documentation

## 2021-07-26 DIAGNOSIS — I252 Old myocardial infarction: Secondary | ICD-10-CM | POA: Insufficient documentation

## 2021-07-26 DIAGNOSIS — Z79899 Other long term (current) drug therapy: Secondary | ICD-10-CM | POA: Diagnosis not present

## 2021-07-26 DIAGNOSIS — M25472 Effusion, left ankle: Secondary | ICD-10-CM | POA: Insufficient documentation

## 2021-07-26 DIAGNOSIS — M25471 Effusion, right ankle: Secondary | ICD-10-CM | POA: Diagnosis not present

## 2021-07-26 DIAGNOSIS — R059 Cough, unspecified: Secondary | ICD-10-CM | POA: Insufficient documentation

## 2021-07-26 DIAGNOSIS — I10 Essential (primary) hypertension: Secondary | ICD-10-CM | POA: Diagnosis not present

## 2021-07-26 DIAGNOSIS — C3411 Malignant neoplasm of upper lobe, right bronchus or lung: Secondary | ICD-10-CM | POA: Insufficient documentation

## 2021-07-26 DIAGNOSIS — Z8673 Personal history of transient ischemic attack (TIA), and cerebral infarction without residual deficits: Secondary | ICD-10-CM | POA: Diagnosis not present

## 2021-07-26 DIAGNOSIS — I6381 Other cerebral infarction due to occlusion or stenosis of small artery: Secondary | ICD-10-CM | POA: Diagnosis not present

## 2021-07-26 DIAGNOSIS — I2510 Atherosclerotic heart disease of native coronary artery without angina pectoris: Secondary | ICD-10-CM | POA: Diagnosis not present

## 2021-07-26 DIAGNOSIS — Z5112 Encounter for antineoplastic immunotherapy: Secondary | ICD-10-CM | POA: Insufficient documentation

## 2021-07-26 DIAGNOSIS — C3490 Malignant neoplasm of unspecified part of unspecified bronchus or lung: Secondary | ICD-10-CM

## 2021-07-26 DIAGNOSIS — I6782 Cerebral ischemia: Secondary | ICD-10-CM | POA: Diagnosis not present

## 2021-07-26 DIAGNOSIS — Z7982 Long term (current) use of aspirin: Secondary | ICD-10-CM | POA: Insufficient documentation

## 2021-07-26 MED ORDER — CYANOCOBALAMIN 1000 MCG/ML IJ SOLN
1000.0000 ug | Freq: Once | INTRAMUSCULAR | Status: AC
  Administered 2021-07-26: 1000 ug via INTRAMUSCULAR
  Filled 2021-07-26: qty 1

## 2021-07-26 MED ORDER — FUROSEMIDE 20 MG PO TABS: 20.0000 mg | ORAL_TABLET | Freq: Every day | ORAL | 2 refills | Status: DC | PRN

## 2021-07-26 MED ORDER — FOLIC ACID 1 MG PO TABS: 1.0000 mg | ORAL_TABLET | Freq: Every day | ORAL | 2 refills | Status: DC

## 2021-07-26 NOTE — Progress Notes (Signed)
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    CANCER STAGING:  Cancer Staging  Primary adenocarcinoma of upper lobe of right lung [facility]) Staging form: Lung, AJCC 8th Edition - Clinical stage from 06/26/2021: cT1, cN1, cM0 - Unsigned   INTERVAL HISTORY:  Darryl Ward, a 76 y.o. male, returns for routine follow-up of his right upper lobe lung adenocarcinoma. Darryl Ward was last seen on 06/26/2021.   Today he reports feeling well. He reports swelling in his ankles since January. He also reports intermittent CP while coughing over the past 2 weeks; his cough is dry and has increased. He also reports back pain.  REVIEW OF SYSTEMS:  Review of Systems  Constitutional:  Positive for fatigue. Negative for appetite change.  Respiratory:  Positive for cough.   Cardiovascular:  Positive for chest pain and leg swelling (ankles).  Musculoskeletal:  Positive for back pain (8/10 low).  Neurological:  Positive for headaches.  Hematological:  Bruises/bleeds easily.  All other systems reviewed and are negative.   PAST MEDICAL/SURGICAL HISTORY:  Past Medical History:  Diagnosis Date   AAA (abdominal aortic aneurysm) (HCC)    Arthritis    Carotid artery disease ([place])    Nonobstructive   Cataract    Coronary atherosclerosis of native coronary artery    PTCA small diagonal 2007 otherwise nonobstructive CAD, EF 50-55%   Essential hypertension, benign    Hyperlipidemia    NSTEMI (non-ST elevated myocardial infarction) ([place]) 2007   Stroke [facility]) 2004   TIA (transient ischemic attack) 2006   Past Surgical  History:  Procedure Laterality Date   AORTA - BILATERAL FEMORAL ARTERY BYPASS GRAFT  01/08/2012   Procedure: AORTA BIFEMORAL BYPASS GRAFT;  Surgeon: Elam Dutch, MD;  Location: [facility] OR;  Service: Vascular;  Laterality: Bilateral;  using 18x27m x 40cm Hemashield Gold Vascular Graft with Endarterectomy, Thombectomy and  Reimplantation of Inferior Mesenteric Artery   BRONCHIAL BIOPSY  06/11/2021   Procedure: BRONCHIAL BIOPSIES;  Surgeon: BCollene Gobble MD;  [facility];  Service: Pulmonary;;   BRONCHIAL BRUSHINGS  06/11/2021   Procedure: BRONCHIAL BRUSHINGS;  Surgeon: BCollene Gobble MD;  [facility];  Service: Pulmonary;;   BRONCHIAL NEEDLE ASPIRATION BIOPSY  06/11/2021   Procedure: BRONCHIAL NEEDLE ASPIRATION BIOPSIES;  Surgeon: BCollene Gobble MD;  [facility];  Service: Pulmonary;;   COLONOSCOPY N/A 04/14/2019   Procedure: COLONOSCOPY;  Surgeon: RDaneil Dolin MD;  Location: [facility] ENDO SUITE;  Service: Endoscopy;  Laterality: N/A;  9:30   FIDUCIAL MARKER PLACEMENT  06/11/2021   Procedure: FIDUCIAL MARKER PLACEMENT;  Surgeon: BCollene Gobble MD;  [facility];  Service: Pulmonary;;   Left cataract surgery     POLYPECTOMY  04/14/2019   Procedure: POLYPECTOMY;  Surgeon: RDaneil Dolin MD;  [facility];  Service: Endoscopy;;   TRANSFORAMINAL LUMBAR INTERBODY FUSION (TLIF) WITH PEDICLE SCREW FIXATION 1 LEVEL N/A 04/27/2020   Procedure: Transforaminal Lumbar Interbody Fusion Lumbar Five-Sacral One;  Surgeon: TVallarie Mare MD;  [place]  Service: Neurosurgery;  Laterality: N/A;   VIDEO BRONCHOSCOPY WITH  RADIAL ENDOBRONCHIAL ULTRASOUND  06/11/2021   Procedure: VIDEO BRONCHOSCOPY WITH RADIAL ENDOBRONCHIAL ULTRASOUND;  Surgeon: [name], MD;  Location: [facility] ENDOSCOPY;  Service: Pulmonary;;    SOCIAL HISTORY:  Social History   Socioeconomic History   Marital status: Divorced    Spouse name: Not on file   Number of children:  1   Years of education: 11   Highest education level: 11th grade  Occupational History    Employer: Probation officer  Tobacco Use   Smoking status: Every Day    Packs/day: 1.00    Years: 40.00    Total pack years: 40.00    Types: Cigarettes   Smokeless tobacco: Never   Tobacco comments:    1 pack of cigarettes smoked daily. 07/17/21 ARJ, RN   Vaping Use   Vaping Use: Never used  Substance and Sexual Activity   Alcohol use: No    Comment: Prior history of regular alcohol use   Drug use: No   Sexual activity: Not Currently  Other Topics Concern   Not on file  Social History Narrative   Not on file   Social Determinants of Health   Financial Resource Strain: Low Risk  (11/29/2019)   Overall Financial Resource Strain (CARDIA)    Difficulty of Paying Living Expenses: Not hard at all  Food Insecurity: No Food Insecurity (11/29/2019)   Hunger Vital Sign    Worried About Running Out of Food in the Last Year: Never true    Ran Out of Food in the Last Year: Never true  Transportation Needs: No Transportation Needs (11/29/2019)   PRAPARE - Hydrologist (Medical): No    Lack of Transportation (Non-Medical): No  Physical Activity: Inactive (11/29/2019)   Exercise Vital Sign    Days of Exercise per Week: 0 days    Minutes of Exercise per Session: 0 min  Stress: No Stress Concern Present (11/29/2019)   [place]    Feeling of Stress : Only a little  Social Connections: Moderately Isolated (11/29/2019)   Social Connection and Isolation Panel [NHANES]    Frequency of Communication with Friends and Family: More than three times a week    Frequency of Social Gatherings with Friends and Family: More than three times a week    Attends Religious Services: 1 to 4 times per year    Active Member of [employer] or Organizations: No    Attends Archivist Meetings: Never    Marital Status: Divorced   Human resources officer Violence: Not At Risk (11/20/2018)   Humiliation, Afraid, Rape, and Kick questionnaire    Fear of Current or Ex-Partner: No    Emotionally Abused: No    Physically Abused: No    Sexually Abused: No    FAMILY HISTORY:  Family History  Problem Relation Age of Onset   Hyperlipidemia Sister    Heart attack Brother 57   Cancer - Colon Neg Hx     CURRENT MEDICATIONS:  Current Outpatient Medications  Medication Sig Dispense Refill   aspirin EC 81 MG tablet Take 1 tablet (81 mg total) by mouth daily. Okay to restart this medication on 06/12/2021 90 tablet 3   atorvastatin (LIPITOR) 80 MG tablet Take 1 tablet (80 mg total) by mouth daily. 90 tablet 3   metoprolol succinate (TOPROL-XL) 25 MG 24 hr tablet Take 1 tablet (25 mg total) by mouth daily. 90 tablet 3   oxyCODONE-acetaminophen (PERCOCET/ROXICET) 5-325 MG  tablet Take 1-2 tablets by mouth every 4 (four) hours as needed for moderate pain or severe pain. 45 tablet 0   No current facility-administered medications for this visit.    ALLERGIES:  No Known Allergies  PHYSICAL EXAM:  Performance status (ECOG): 1 - Symptomatic but completely ambulatory  Vitals:   07/26/21 1513  BP: 129/62  Pulse: 70  Resp: 16  Temp: 97.9 F (36.6 C)  SpO2: 96%   Wt Readings from Last 3 Encounters:  07/26/21 168 lb 12.8 oz (76.6 kg)  07/25/21 172 lb 6.4 oz (78.2 kg)  07/17/21 172 lb 6.4 oz (78.2 kg)   Physical Exam   LABORATORY DATA:  I have reviewed the labs as listed.     Latest Ref Rng & Units 06/11/2021    7:51 AM 07/13/2020   10:06 AM 04/25/2020    2:37 PM  CBC  WBC 4.0 - 10.5 K/uL 8.8  11.6  10.2   Hemoglobin 13.0 - 17.0 g/dL 12.5  13.1  13.2   Hematocrit 39.0 - 52.0 % 38.6  38.4  40.2   Platelets 150 - 400 K/uL 163  198  145       Latest Ref Rng & Units 06/11/2021    7:51 AM 07/13/2020   10:06 AM 04/25/2020    2:37 PM  CMP  Glucose 70 - 99 mg/dL 87  84  118   BUN 8 - 23 mg/dL 11  10  14    Creatinine 0.61 - 1.24  mg/dL 0.82  0.94  0.95   Sodium 135 - 145 mmol/L 137  139  138   Potassium 3.5 - 5.1 mmol/L 4.8  4.5  3.8   Chloride 98 - 111 mmol/L 110  105  108   CO2 22 - 32 mmol/L 22  20  25    Calcium 8.9 - 10.3 mg/dL 8.8  9.4  8.6   Total Protein 6.0 - 8.5 g/dL  6.8    Total Bilirubin 0.0 - 1.2 mg/dL  0.4    Alkaline Phos 44 - 121 IU/L  73    AST 0 - 40 IU/L  12    ALT 0 - 44 IU/L  7      DIAGNOSTIC IMAGING:  I have independently reviewed the scans and discussed with the patient. MR Brain W Wo Contrast  Result Date: 07/12/2021 CLINICAL DATA:  Non-small cell lung cancer staging. Right-sided numbness and weakness with difficulty walking EXAM: MRI HEAD WITHOUT AND WITH CONTRAST TECHNIQUE: Multiplanar, multiecho pulse sequences of the brain and surrounding structures were obtained without and with intravenous contrast. CONTRAST:  7.67m GADAVIST GADOBUTROL 1 MMOL/ML IV SOLN COMPARISON:  02/05/2020 FINDINGS: Brain: No enhancement or swelling to suggest metastatic disease. There is a known right sphenoid wing meningioma which is avidly enhancing with up to 25 x 17 mm dimensions on axial slices. There is a dural tail extending along the middle cranial fossa and underlying anterior clinoid. 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 Ischemic gliosis in the cerebral white matter. Central predominant volume loss. Vascular: Major flow voids and vascular enhancements are preserved Skull and upper cervical spine: No focal marrow lesion Sinuses/Orbits: Chronic bilateral mastoid opacification with negative nasopharynx. IMPRESSION: 1. No evidence of metastatic disease. 2. Right sphenoid wing meningioma that is stable from 2022. 3. 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 Electronically Signed   By: JJorje GuildM.D.   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  Occasional additional small pulmonary nodules measuring 0.4 cm and smaller. - Bronchoscopy (06/11/2021): RUL nodule brushing and FNA: Malignant cells with features of adenocarcinoma. - PET scan (06/22/2021): Subsolid nodular lesion in the right upper lobe, hypermetabolic SUV 15.  Small right hilar and infrahilar lymph nodes with SUV 4.03 concerning for metastatic adenopathy.  Small hypermetabolic left parotid gland lesion, consistent with previous history of Wharton's tumor.  Hypermetabolic focus in the transverse colon SUV 8.69. - MRI of the brain (07/11/2021): No evidence of metastatic disease.  Right sphenoid wing meningioma stable since 2022. - He was evaluated by Dr. Roxan Hockey and was recommended to undergo neoadjuvant chemoimmunotherapy followed by surgical resection.    Social/family history: - Lives at home with his wife.  Uses cane occasionally after he had stroke in January 2022.  He has retired after working in [employer].  He is current active smoker, 1 pack/day for 60 years.  No exposure to chemicals. - Brother died of metastatic cancer.  Maternal uncle had lung cancer.   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  Right sphenoid wing meningioma stable from 2022. - He was evaluated by Dr. Roxan Hockey and was recommended neoadjuvant chemoimmunotherapy. - We have discussed the neoadjuvant chemoimmunotherapy with carboplatin, pemetrexed and immunotherapy. - Recommend port placement. - He will receive B12 injection today in preparation for treatment.  We will start him on folic acid 1 mg tablet daily. - We will check driver mutation for EGFR/ALK. - We discussed side effects of chemoimmunotherapy in detail.  Literature was given to him. - He reported new onset cough and retrosternal chest pain when he coughs for the past  2 weeks.  We will closely monitor.  2.  Abdominal uptake on PET scan: - Last colonoscopy on 04/14/2019 with multiple adenomatous polyps removed. - Uptake area appears to be in the transverse colon lesion. - We will make a referral to Dr. Gala Romney for colonoscopy.   Orders placed this encounter:  No orders of the defined types were placed in this encounter.    Derek Jack, MD [place] [phone]   I, Thana Ates, am acting as a scribe for Dr. Derek Jack.  I, Derek Jack MD, have reviewed the above documentation for accuracy and completeness, and I agree with the above.

## 2021-07-26 NOTE — Patient Instructions (Addendum)
[place] at [facility] Discharge Instructions  You were seen and examined today by Dr. Delton Coombes.  Dr. Delton Coombes discussed your most recent lab work and scans. Dr. Delton Coombes has reviewed the notes from your most recent visit with Dr. Roxan Hockey.  Dr. Delton Coombes recommends that you get a Port-A-Cath. A Port-A-Cath is the safest way to administer chemotherapy and will also allow for [name] to draw your labs from the same site.  Dr. Delton Coombes has discussed a combination of chemotherapy and immunotherapy. This is administered on the same day once every 3 weeks.  Follow-up as scheduled.  Thank you for choosing [place] at [facility] to provide your oncology and hematology care.  To afford each patient quality time with our provider, please arrive at least 15 minutes before your scheduled appointment time.   If you have a lab appointment with the [facility] please come in thru the Main Entrance and check in at the main information desk.  You need to re-schedule your appointment should you arrive 10 or more minutes late.  We strive to give you quality time with our providers, and arriving late affects you and other patients whose appointments are after yours.  Also, if you no show three or more times for appointments you may be dismissed from the clinic at the providers discretion.     Again, thank you for choosing [facility].  Our hope is that these requests will decrease the amount of time that you wait before being seen by our physicians.       _____________________________________________________________  Should you have questions after your visit to [facility], please contact our office at [phone] and follow the prompts.  Our office hours are 8:00 a.m. and 4:30 p.m. Monday - Friday.  Please note that voicemails left after 4:00 p.m. may not be returned until the following business day.  We are closed weekends  and major holidays.  You do have access to a nurse 24-7, just call the main number to the clinic [phone] and do not press any options, hold on the line and a nurse will answer the phone.    For prescription refill requests, have your pharmacy contact our office and allow 72 hours.

## 2021-07-27 LAB — SURGICAL PATHOLOGY

## 2021-07-30 ENCOUNTER — Other Ambulatory Visit (HOSPITAL_COMMUNITY): Payer: Self-pay | Admitting: Physician Assistant

## 2021-07-31 ENCOUNTER — Other Ambulatory Visit: Payer: Self-pay

## 2021-07-31 ENCOUNTER — Ambulatory Visit (HOSPITAL_COMMUNITY)
Admission: RE | Admit: 2021-07-31 | Discharge: 2021-07-31 | Disposition: A | Discharge status: Home / Self Care | Payer: Medicare Other | Source: Ambulatory Visit | Attending: Hematology | Admitting: Hematology

## 2021-07-31 DIAGNOSIS — J449 Chronic obstructive pulmonary disease, unspecified: Secondary | ICD-10-CM | POA: Insufficient documentation

## 2021-07-31 DIAGNOSIS — C3411 Malignant neoplasm of upper lobe, right bronchus or lung: Secondary | ICD-10-CM | POA: Insufficient documentation

## 2021-07-31 DIAGNOSIS — F17210 Nicotine dependence, cigarettes, uncomplicated: Secondary | ICD-10-CM | POA: Diagnosis not present

## 2021-07-31 DIAGNOSIS — I252 Old myocardial infarction: Secondary | ICD-10-CM | POA: Diagnosis not present

## 2021-07-31 DIAGNOSIS — I714 Abdominal aortic aneurysm, without rupture, unspecified: Secondary | ICD-10-CM | POA: Insufficient documentation

## 2021-07-31 DIAGNOSIS — I10 Essential (primary) hypertension: Secondary | ICD-10-CM | POA: Diagnosis not present

## 2021-07-31 DIAGNOSIS — C3490 Malignant neoplasm of unspecified part of unspecified bronchus or lung: Secondary | ICD-10-CM

## 2021-07-31 DIAGNOSIS — Z452 Encounter for adjustment and management of vascular access device: Secondary | ICD-10-CM | POA: Diagnosis not present

## 2021-07-31 DIAGNOSIS — Z8673 Personal history of transient ischemic attack (TIA), and cerebral infarction without residual deficits: Secondary | ICD-10-CM | POA: Diagnosis not present

## 2021-07-31 DIAGNOSIS — I2510 Atherosclerotic heart disease of native coronary artery without angina pectoris: Secondary | ICD-10-CM | POA: Diagnosis not present

## 2021-07-31 DIAGNOSIS — C3491 Malignant neoplasm of unspecified part of right bronchus or lung: Secondary | ICD-10-CM | POA: Diagnosis not present

## 2021-07-31 HISTORY — PX: IR IMAGING GUIDED PORT INSERTION: IMG5740

## 2021-07-31 MED ORDER — FENTANYL CITRATE (PF) 100 MCG/2ML IJ SOLN
INTRAMUSCULAR | Status: DC | PRN
  Administered 2021-07-31 (×2): 50 ug via INTRAVENOUS

## 2021-07-31 MED ORDER — MIDAZOLAM HCL 2 MG/2ML IJ SOLN
INTRAMUSCULAR | Status: AC
  Filled 2021-07-31: qty 2

## 2021-07-31 MED ORDER — SODIUM CHLORIDE 0.9 % IV SOLN: INTRAVENOUS | Status: DC

## 2021-07-31 MED ORDER — MIDAZOLAM HCL 2 MG/2ML IJ SOLN
INTRAMUSCULAR | Status: DC | PRN
  Administered 2021-07-31 (×2): 1 mg via INTRAVENOUS

## 2021-07-31 MED ORDER — HEPARIN SOD (PORK) LOCK FLUSH 100 UNIT/ML IV SOLN
INTRAVENOUS | Status: DC | PRN
  Administered 2021-07-31: 500 [IU] via INTRAVENOUS

## 2021-07-31 MED ORDER — HEPARIN SOD (PORK) LOCK FLUSH 100 UNIT/ML IV SOLN
INTRAVENOUS | Status: AC
  Filled 2021-07-31: qty 5

## 2021-07-31 MED ORDER — FENTANYL CITRATE (PF) 100 MCG/2ML IJ SOLN
INTRAMUSCULAR | Status: AC
  Filled 2021-07-31: qty 2

## 2021-07-31 MED ORDER — LIDOCAINE-EPINEPHRINE 1 %-1:100000 IJ SOLN
INTRAMUSCULAR | Status: AC
  Filled 2021-07-31: qty 1

## 2021-07-31 MED ORDER — LIDOCAINE-EPINEPHRINE 1 %-1:100000 IJ SOLN
INTRAMUSCULAR | Status: DC | PRN
  Administered 2021-07-31: 20 mL

## 2021-07-31 NOTE — Procedures (Signed)
Vascular and Interventional Radiology Procedure Note  Patient: Darryl Ward DOB: [birth year] Medical Record Number: [id number] Note Date/Time: 07/31/21 1:07 PM   Performing Physician: Michaelle Birks, MD Assistant(s): None  Diagnosis: Lung cancer  Procedure: PORT PLACEMENT  Anesthesia: Conscious Sedation Complications: None Estimated Blood Loss: Minimal  Findings:  Successful left-sided port placement, with the tip of the catheter in the proximal right atrium.  Plan: Catheter ready for use.  See detailed procedure note with images in PACS. The patient tolerated the procedure well without incident or complication and was returned to Recovery in stable condition.    Michaelle Birks, MD [facility]   Pager. [place]

## 2021-07-31 NOTE — Sedation Documentation (Signed)
Patient is resting comfortably. VSS °

## 2021-07-31 NOTE — Sedation Documentation (Signed)
Vital signs stable. 

## 2021-07-31 NOTE — Sedation Documentation (Signed)
Patient is resting comfortably. 

## 2021-07-31 NOTE — H&P (Signed)
Chief Complaint: Patient was seen in consultation today for port-a-catheter placement.   Referring Physician(s): Firefighter  Supervising Physician: Corrie Mckusick  Patient Status: [facility] - Out-pt  History of Present Illness: Darryl Ward is a 76 y.o. male with a medical history significant for tobacco use, COPD, HTN, MI, CAD, stroke, TIA, abdominal aortic aneurysm, lumbar radiculopathy, meningioma and recently diagnosed lung cancer. A right upper lobe lung nodule was identified on a lung cancer screening CT. Subsequent work up including bronchoscopy with biopsies revealed adenocarcinoma. He has plans for possible surgical resection following neoadjuvant chemotherapy.   Interventional Radiology has been asked to evaluate this patient for an image-guided port-a-catheter placement to facilitate his treatment plans.   Past Medical History:  Diagnosis Date   AAA (abdominal aortic aneurysm) (HCC)    Arthritis    Carotid artery disease ([place])    Nonobstructive   Cataract    Coronary atherosclerosis of native coronary artery    PTCA small diagonal 2007 otherwise nonobstructive CAD, EF 50-55%   Essential hypertension, benign    Hyperlipidemia    NSTEMI (non-ST elevated myocardial infarction) ([place]) 2007   Stroke [facility]) 2004   TIA (transient ischemic attack) 2006    Past Surgical History:  Procedure Laterality Date   AORTA - BILATERAL FEMORAL ARTERY BYPASS GRAFT  01/08/2012   Procedure: AORTA BIFEMORAL BYPASS GRAFT;  Surgeon: Elam Dutch, MD;  Location: [facility] OR;  Service: Vascular;  Laterality: Bilateral;  using 18x2mm x 40cm Hemashield Gold Vascular Graft with Endarterectomy, Thombectomy and  Reimplantation of Inferior Mesenteric Artery   BRONCHIAL BIOPSY  06/11/2021   Procedure: BRONCHIAL BIOPSIES;  Surgeon: [name], MD;  Location: [facility] ENDOSCOPY;  Service: Pulmonary;;   BRONCHIAL BRUSHINGS  06/11/2021   Procedure: BRONCHIAL BRUSHINGS;  Surgeon: [name], MD;   Location: [facility] ENDOSCOPY;  Service: Pulmonary;;   BRONCHIAL NEEDLE ASPIRATION BIOPSY  06/11/2021   Procedure: BRONCHIAL NEEDLE ASPIRATION BIOPSIES;  Surgeon: [name], MD;  Location: [facility] ENDOSCOPY;  Service: Pulmonary;;   COLONOSCOPY N/A 04/14/2019   Procedure: COLONOSCOPY;  Surgeon: Daneil Dolin, MD;  Location: [facility] ENDO SUITE;  Service: Endoscopy;  Laterality: N/A;  9:30   FIDUCIAL MARKER PLACEMENT  06/11/2021   Procedure: FIDUCIAL MARKER PLACEMENT;  Surgeon: [name], MD;  Location: [facility] ENDOSCOPY;  Service: Pulmonary;;   Left cataract surgery     POLYPECTOMY  04/14/2019   Procedure: POLYPECTOMY;  Surgeon: Daneil Dolin, MD;  Location: [facility] ENDO SUITE;  Service: Endoscopy;;   TRANSFORAMINAL LUMBAR INTERBODY FUSION (TLIF) WITH PEDICLE SCREW FIXATION 1 LEVEL N/A 04/27/2020   Procedure: Transforaminal Lumbar Interbody Fusion Lumbar Five-Sacral One;  Surgeon: Vallarie Mare, MD;  Location: [place];  Service: Neurosurgery;  Laterality: N/A;   VIDEO BRONCHOSCOPY WITH RADIAL ENDOBRONCHIAL ULTRASOUND  06/11/2021   Procedure: VIDEO BRONCHOSCOPY WITH RADIAL ENDOBRONCHIAL ULTRASOUND;  Surgeon: [name], MD;  Location: [facility] ENDOSCOPY;  Service: Pulmonary;;    Allergies: Patient has no known allergies.  Medications: Prior to Admission medications   Medication Sig Start Date End Date Taking? Authorizing Provider  aspirin EC 81 MG tablet Take 1 tablet (81 mg total) by mouth daily. Okay to restart this medication on 06/12/2021 06/11/21  Yes [name], MD  atorvastatin (LIPITOR) 80 MG tablet Take 80 mg by mouth daily.   Yes [provider]  folic acid (FOLVITE) 1 MG tablet Take 1 tablet (1 mg total) by mouth daily. 07/26/21  Yes Derek Jack, MD  furosemide (LASIX)  20 MG tablet Take 1 tablet (20 mg total) by mouth daily as needed. 07/26/21  Yes Derek Jack, MD  metoprolol succinate (TOPROL-XL) 25 MG 24 hr tablet Take 1 tablet (25 mg total) by mouth daily. 02/16/20  Yes  Ivy Lynn, NP  oxyCODONE-acetaminophen (PERCOCET/ROXICET) 5-325 MG tablet Take 1-2 tablets by mouth every 4 (four) hours as needed for moderate pain or severe pain. 04/28/20  Yes Vallarie Mare, MD     Family History  Problem Relation Age of Onset   Hyperlipidemia Sister    Heart attack Brother 63   Cancer - Colon Neg Hx     Social History   Socioeconomic History   Marital status: Divorced    Spouse name: Not on file   Number of children: 1   Years of education: 11   Highest education level: 11th grade  Occupational History    Employer: Probation officer  Tobacco Use   Smoking status: Every Day    Packs/day: 1.00    Years: 40.00    Total pack years: 40.00    Types: Cigarettes   Smokeless tobacco: Never   Tobacco comments:    1 pack of cigarettes smoked daily. 07/17/21 ARJ, RN   Vaping Use   Vaping Use: Never used  Substance and Sexual Activity   Alcohol use: No    Comment: Prior history of regular alcohol use   Drug use: No   Sexual activity: Not Currently  Other Topics Concern   Not on file  Social History Narrative   Not on file   Social Determinants of Health   Financial Resource Strain: Low Risk  (11/29/2019)   Overall Financial Resource Strain (CARDIA)    Difficulty of Paying Living Expenses: Not hard at all  Food Insecurity: No Food Insecurity (11/29/2019)   Hunger Vital Sign    Worried About Running Out of Food in the Last Year: Never true    Ran Out of Food in the Last Year: Never true  Transportation Needs: No Transportation Needs (11/29/2019)   PRAPARE - Hydrologist (Medical): No    Lack of Transportation (Non-Medical): No  Physical Activity: Inactive (11/29/2019)   Exercise Vital Sign    Days of Exercise per Week: 0 days    Minutes of Exercise per Session: 0 min  Stress: No Stress Concern Present (11/29/2019)   [place]    Feeling of Stress : Only  a little  Social Connections: Moderately Isolated (11/29/2019)   Social Connection and Isolation Panel [NHANES]    Frequency of Communication with Friends and Family: More than three times a week    Frequency of Social Gatherings with Friends and Family: More than three times a week    Attends Religious Services: 1 to 4 times per year    Active Member of [employer] or Organizations: No    Attends Archivist Meetings: Never    Marital Status: Divorced    Review of Systems: A 12 point ROS discussed and pertinent positives are indicated in the HPI above.  All other systems are negative.  Review of Systems  Constitutional:  Negative for appetite change and fatigue.  Respiratory:  Negative for cough and shortness of breath.   Cardiovascular:  Negative for chest pain and leg swelling.  Gastrointestinal:  Negative for abdominal pain, diarrhea, nausea and vomiting.  Neurological:  Negative for dizziness and headaches.    Vital Signs: BP 122/76  Pulse 63   Temp 98 F (36.7 C) (Oral)   Resp 20   Ht 5\' 10"  (1.778 m)   Wt 175 lb (79.4 kg)   SpO2 98%   BMI 25.11 kg/m   Physical Exam Constitutional:      General: He is not in acute distress.    Appearance: He is not ill-appearing.  HENT:     Mouth/Throat:     Mouth: Mucous membranes are moist.     Pharynx: Oropharynx is clear.  Cardiovascular:     Rate and Rhythm: Normal rate and regular rhythm.     Pulses: Normal pulses.     Heart sounds: Normal heart sounds.  Pulmonary:     Effort: Pulmonary effort is normal.     Breath sounds: Normal breath sounds.  Abdominal:     General: Bowel sounds are normal.     Palpations: Abdomen is soft.     Tenderness: There is no abdominal tenderness.  Musculoskeletal:        General: Normal range of motion.     Right lower leg: No edema.     Left lower leg: No edema.  Skin:    General: Skin is warm and dry.  Neurological:     Mental Status: He is alert and oriented to person, place,  and time.     Imaging: MR Brain W Wo Contrast  Result Date: 07/12/2021 CLINICAL DATA:  Non-small cell lung cancer staging. Right-sided numbness and weakness with difficulty walking EXAM: MRI HEAD WITHOUT AND WITH CONTRAST TECHNIQUE: Multiplanar, multiecho pulse sequences of the brain and surrounding structures were obtained without and with intravenous contrast. CONTRAST:  7.51mL GADAVIST GADOBUTROL 1 MMOL/ML IV SOLN COMPARISON:  02/05/2020 FINDINGS: Brain: No enhancement or swelling to suggest metastatic disease. There is a known right sphenoid wing meningioma which is avidly enhancing with up to 25 x 17 mm dimensions on axial slices. There is a dural tail extending along the middle cranial fossa and underlying anterior clinoid. 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 Ischemic gliosis in the cerebral white matter. Central predominant volume loss. Vascular: Major flow voids and vascular enhancements are preserved Skull and upper cervical spine: No focal marrow lesion Sinuses/Orbits: Chronic bilateral mastoid opacification with negative nasopharynx. IMPRESSION: 1. No evidence of metastatic disease. 2. Right sphenoid wing meningioma that is stable from 2022. 3. 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 Electronically Signed   By: Jorje Guild M.D.   On: 07/12/2021 07:22    Labs:  CBC: Recent Labs    06/11/21 0751  WBC 8.8  HGB 12.5*  HCT 38.6*  PLT 163    COAGS: No results for input(s): "INR", "APTT" in the last 8760 hours.  BMP: Recent Labs    06/11/21 0751  NA 137  K 4.8  CL 110  CO2 22  GLUCOSE 87  BUN 11  CALCIUM 8.8*  CREATININE 0.82  GFRNONAA >60    LIVER FUNCTION TESTS: No results for input(s): "BILITOT", "AST", "ALT", "ALKPHOS", "PROT", "ALBUMIN" in the last 8760 hours.  TUMOR MARKERS: No results for input(s): "AFPTM", "CEA", "CA199", "CHROMGRNA" in the last 8760  hours.  Assessment and Plan:  Right upper lobe adenocarcinoma: [name]. [place], 76 year old male, presents today to the [facility] department for an image-guided port-a-catheter placement.  Risks and benefits of image-guided port-a-catheter placement were discussed with the patient including, but not limited to bleeding, infection, pneumothorax, or fibrin sheath  development and need for additional procedures.  All of the patient's questions were answered, patient is agreeable to proceed. He has been NPO.   Consent signed and in chart.  Thank you for this interesting consult.  I greatly enjoyed meeting SHAHEEN MENDE and look forward to participating in their care.  A copy of this report was sent to the requesting provider on this date.  Electronically Signed: Soyla Dryer, AGACNP-BC [phone] 07/31/2021, 11:14 AM   I spent a total of  30 Minutes   in face to face in clinical consultation, greater than 50% of which was counseling/coordinating care for port-a-catheter placement.

## 2021-07-31 NOTE — Sedation Documentation (Signed)
Pt tolerated procedure very well.   Totals Time 23 mins Versed 2 mg Fentanyl 100 mcg

## 2021-07-31 NOTE — Patient Instructions (Addendum)
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 You will be treated in the clinic every 3 weeks with a combination of chemotherapy drugs and an immunotherapy drug. Those drugs are pemetrexed (Alimta), carboplatin, and pembrolizumab (Keytruda). The intent of treatment is cure. You will see the doctor regularly throughout treatment.  We will obtain blood work from you prior to every treatment and monitor your results to make sure it is safe to give your treatment. The doctor monitors your response to treatment by the way you are feeling, your blood work, and by obtaining scans periodically. There will be wait times while you are here for treatment.  It will take about 30 minutes to 1 hour for your lab work to result.  Then there will be wait times while pharmacy mixes your medications.   You must take folic acid every day by mouth beginning 7 days before your first dose of alimta.  You must keep taking folic acid every day during the time you are being treated with alimta, and for every day for 21 days after you receive your last alimta dose.    You will get B12 injections while you are on alimta.  The first one about 1 week prior to starting treatment and then about every 9 weeks during treatment.   Medications you will receive in the clinic prior to your chemotherapy medications:  Aloxi:  ALOXI is used in adults to help prevent nausea and vomiting that happens with certain chemotherapy drugs.  Aloxi is a long acting medication, and will remain in your system for about two days.   Emend:  This is an anti-nausea medication that is used with Aloxi to help prevent nausea and vomiting caused by chemotherapy.  Dexamethasone:  This is a steroid given prior to chemotherapy to help prevent allergic reactions; it may also help prevent and control nausea and diarrhea.    Carboplatin (Generic Name) Other Names: Paraplatin,  CBDCA  About This Drug Carboplatin is a drug used to treat cancer. This drug is given in the vein (IV).  Takes 30 minutes to infuse.   Possible Side Effects (More Common)  Nausea and throwing up (vomiting). These symptoms may happen within a few hours after your treatment and may last up to 24 hours. Medicines are available to stop or lessen these side effects.   Bone marrow depression. This is a decrease in the number of white blood cells, red blood cells, and platelets. This may raise your risk of infection, make you tired and weak (fatigue), and raise your risk of bleeding.   Soreness of the mouth and throat. You may have red areas, white patches, or sores that hurt.   This drug may affect how your kidneys work. Your kidney function will be checked as needed.   Electrolyte changes. Your blood will be checked for electrolyte changes as needed.  Possible Side Effects (Less Common)   Hair loss. Some patients lose their hair on the scalp and body. You may notice your hair thinning seven to 14 days after getting this drug.   Effects on the nerves are called peripheral neuropathy. You may feel numbness, tingling, or pain in your hands and feet. It may be hard for you to button your clothes, open jars, or walk as usual. The effect on the nerves may get worse with more doses of the drug. These effects get better in some people after the drug is  stopped but it does not get better in all people.   Loose bowel movements (diarrhea) that may last for several days   Decreased hearing or ringing in the ears   Changes in the way food and drinks taste   Changes in liver function. Your liver function will be checked as needed  Allergic Reactions Serious allergic reactions including anaphylaxis are rare. While you are getting this drug in your vein (IV), tell your nurse right away if you have any of these symptoms of an allergic reaction:   Trouble catching your breath   Feeling like your tongue  or throat are swelling   Feeling your heart beat quickly or in a not normal way (palpitations)   Feeling dizzy or lightheaded   Flushing, itching, rash, and/or hives  Treating Side Effects   Drink 6-8 cups of fluids each day unless your doctor has told you to limit your fluid intake due to some other health problem. A cup is 8 ounces of fluid. If you throw up or have loose bowel movements, you should drink more fluids so that you do not become dehydrated (lack water in the body from losing too much fluid).   Mouth care is very important. Your mouth care should consist of routine, gentle cleaning of your teeth or dentures and rinsing your mouth with a mixture of 1/2 teaspoon of salt in 8 ounces of water or  teaspoon of baking soda in 8 ounces of water. This should be done at least after each meal and at bedtime.   If you have mouth sores, avoid mouthwash that has alcohol. Avoid alcohol and smoking because they can bother your mouth and throat.   If you have numbness and tingling in your hands and feet, be careful when cooking, walking, and handling sharp objects and hot liquids.   Talk with your nurse about getting a wig before you lose your hair. Also, call the [employer] at 800-ACS-2345 to find out information about the "Look Good, Feel Better" program close to where you live. It is a free program where women getting chemotherapy can learn about wigs, turbans and scarves as well as makeup techniques and skin and nail care.  Food and Drug Interactions There are no known interactions of carboplatin with food. This drug may interact with other medicines. Tell your doctor and pharmacist about all the medicines and dietary supplements (vitamins, minerals, herbs and others) that you are taking at this time. The safety and use of dietary supplements and alternative diets are often not known. Using these might affect your cancer or interfere with your treatment. Until more is known, you  should not use dietary supplements or alternative diets without your cancer doctor's help.  When to Call the Doctor Call your doctor or nurse right away if you have any of these symptoms:   Fever of 100.4 F (38 C) or above; chills   Bleeding or bruising that is not normal   Wheezing or trouble breathing   Nausea that stops you from eating or drinking   Vomiting   Rash or itching   Loose bowel movements (diarrhea) more than four times a day or diarrhea with weakness or feeling lightheaded  Call your doctor or nurse as soon as possible if any of these symptoms happen:   Numbness, tingling, decreased feeling or weakness in fingers, toes, arms, or legs   Change in hearing, ringing in the ears   Blurred vision or other changes in eyesight  Decreased urine   Yellowing of skin or eyes  Sexual Problems and Reproductive Concerns Sexual problems and reproduction concerns may happen. In both men and women, this drug may affect your ability to have children. This cannot be determined before your treatment. Talk with your doctor or nurse if you plan to have children. Ask for information on sperm or egg banking. In men, this drug may interfere with your ability to make sperm, but it should not change your ability to have sexual relations. In women, menstrual bleeding may become irregular or stop while you are getting this drug. Do not assume that you cannot become pregnant if you do not have a menstrual period. Women may go through signs of menopause (change of life) like vaginal dryness or itching. Vaginal lubricants can be used to lessen vaginal dryness, itching, and pain during sexual relations. Genetic counseling is available for you to talk about the effects of this drug therapy on future pregnancies. Also, a genetic counselor can look at the possible risk of problems in the unborn baby due to this medicine if an exposure happens during pregnancy.   Pregnancy warning: This drug may have  harmful effects on the unborn child, so effective methods of birth control should be used during your cancer treatment.   Breast feeding warning: It is not known if this drug passes into breast milk. For this reason, women should talk to their doctor about the risks and benefits of breast feeding during treatment with this drug because this drug may enter the breast milk and badly harm a breast feeding baby.   Pemetrexed (Generic Name) Other Names: ALIMTA  About This Drug Pemetrexed is used to treat cancer. It is given in the vein (IV).  Takes 10 minutes to infuse.   Possible Side Effects (More Common)   Bone marrow depression. This is a decrease in the number of white blood cells, red blood cells, and platelets. This may raise your risk of infection, make you feel tired and weak (fatigue), and raise your risk of bleeding.   Fatigue   Soreness of the mouth and throat. You may have red areas, white patches, or sores that hurt.  Possible Side Effects (less common)   Trouble breathing or feeling short of breath   Nausea and vomiting   Skin rash  Treating Side Effects   Ask your doctor or nurse about medicine that is available to help stop or lessen nausea and throwing up.   If you get a rash, do not put anything on it unless your doctor or nurse says you may. Keep the area around the rash clean and dry.   Mouth care is very important. Your mouth care should consist of routine, gentle cleaning of your teeth or dentures and rinsing your mouth with a mixture of 1/2 teaspoon of salt in 8 ounces of water or  teaspoon of baking soda in 8 ounces of water. This should be done at least after each meal and at bedtime.   If you have mouth sores, avoid mouthwash that has alcohol. Avoid alcohol and smoking because they can bother your mouth and throat.  Food and Drug Interactions There are no known interactions of this medicine with food. Tell your doctor if you are taking ibuprofen.  Pemetrexed may interact with other medicines. Tell your doctor and pharmacist about all the medicines and dietary supplements (vitamins, minerals, herbs and others) that you are taking at this time. The safety and use of dietary supplements and alternative  diets are often not known. Using these might affect your cancer or interfere with your treatment. Until more is known, you should not use dietary supplements or alternative diets without your cancer doctor's help.  When to Call the Doctor Call your doctor or nurse right away if you have any of these symptoms:   Temperature of 100.4 F (38 C) or above   Chills   Easy bruising or bleeding   Trouble breathing   Chest pain   Nausea that stops you from eating or drinking   Throwing up/vomiting  Call your doctor or nurse as soon as possible if you have any of these symptoms:   Rash that does not go away with prescribed medicine   Nausea or vomiting that does not go away with prescribed medicine   Extreme fatigue that interferes with normal activities  Reproduction Concerns  Pregnancy warning: This drug may have harmful effects on the unborn child, so effective methods of birth control should be used during your cancer treatment.   Genetic counseling is available for you to talk about the effects of this drug therapy on future pregnancies. Also, a genetic counselor can look at the possible risk of problems in the unborn baby due to this medicine if an exposure happens during pregnancy.   Breast feeding warning: It is not known if this drug passes into breast milk. For this reason, women should talk to their doctor about the risks and benefits of breast feeding during treatment with this drug because this drug may enter the breast milk and badly harm a breast feeding baby.   Pembrolizumab Beryle Flock)  About This Drug Pembrolizumab is used to treat cancer. It is given in the vein (IV).  This drug will take 30 minutes to  infuse.  Possible Side Effects  Tiredness  Fever  Nausea  Decreased appetite (decreased hunger)  Loose bowel movements (diarrhea)  Constipation (not able to move bowels)  Trouble breathing  Rash  Itching  Muscle and bone pain  Cough  Note: Each of the side effects above was reported in 20% or greater of patients treated with pembrolizumab. Not all possible side effects are included above.  Warnings and Precautions   This drug works with your immune system and can cause inflammation in any of your organs and tissues and can change how they work. This may put you at risk for developing serious medical problems which can very rarely be fatal.   Colitis (swelling or inflammation in the colon) - symptoms are loose bowel movements (diarrhea) stomach cramping, and sometimes blood in the stool   Changes in liver function. Your liver function will be checked as needed.   Changes in kidney function, which can very rarely be fatal. Your kidney function will be checked as needed.   Inflammation (swelling) of the lungs which can very rarely be fatal - you may have a dry cough or trouble breathing.   This drug may affect some of your hormone glands (especially the thyroid, adrenals, pituitary and pancreas). Your hormone levels will be checked as needed.   Blood sugar levels may change and you may develop diabetes. If you already have diabetes, changes may need to be made to your diabetes medication.   Severe allergic skin reaction, which can very rarely be fatal. You may develop blisters on your skin that are filled with fluid or a severe red rash all over your body that may be painful.   Increased risk of organ rejection in patients who  have received donor organs   Increased risk of complications in patients who will undergo a stem cell transplant after receiving pembrolizumab.   While you are getting this drug in your vein (IV), you may have a reaction to the drug. Your nurse will check  you closely for these signs: fever or shaking chills, flushing, facial swelling, feeling dizzy, headache, trouble breathing, rash, itching, chest tightness, or chest pain. These reactions may occur after your infusion. If this happens, call 911 for emergency care.  Important Information This drug may be present in the saliva, tears, sweat, urine, stool, vomit, semen, and vaginal secretions. Talk to your doctor and/or your nurse about the necessary precautions to take during this time.  Treating Side Effects   Ask your doctor or nurse about medicines that are available to help stop or lessen constipation, diarrhea and/or nausea.   Drink plenty of fluids (a minimum of eight glasses per day is recommended).   If you are not able to move your bowels, check with your doctor or nurse before you use any enemas, laxatives, or suppositories   To help with nausea and vomiting, eat small, frequent meals instead of three large meals a day. Choose foods and drinks that are at room temperature. Ask your nurse or doctor about other helpful tips and medicine that is available to help or stop lessen these symptoms.   If you get diarrhea, eat low-fiber foods that are high in protein and calories and avoid foods that can irritate your digestive tracts or lead to cramping. Ask your nurse or doctor about medicine that can lessen or stop your diarrhea.   Manage tiredness by pacing your activities for the day. Be sure to include periods of rest between energy-draining activities   Keeping your pain under control is important to your wellbeing. Please tell your doctor or nurse if you are experiencing pain.   If you have diabetes, keep good control of your blood sugar level. Tell your nurse or your doctor if your glucose levels are higher or lower than normal   If you get a rash do not put anything on it unless your doctor or nurse says you may. Keep the area around the rash clean and dry. Ask your doctor for medicine  if your rash bothers you.   Infusion reactions may happen for 24 hours after your infusion. If this happens, call 911 for emergency care.  Food and Drug Interactions   There are no known interactions of pembrolizumab with food.   There are no known interactions of pembrolizumab with other medications.   Tell your doctor and pharmacist about all the medicines and dietary supplements (vitamins, minerals, herbs and others) that you are taking at this time. The safety and use of dietary supplements and alternative agents are often not known. Using these might affect your cancer or interfere with your treatment. Until more is known, you should not use dietary supplements or alternative agents without your cancer doctor's help.   When to Call the Doctor Call your doctor or nurse if you have any of the following symptoms and/or any new or unusual symptoms:   Fever of 100.4 F (38 C) or higher   Chills   Wheezing or trouble breathing   Rash or itching   Feeling dizzy or lightheaded   Loose bowel movements (diarrhea) more than 4 times a day or diarrhea with weakness or lightheadedness, or diarrhea that is not controlled by medications   Nausea that stops you from  eating or drinking, and/or that is not relieved by prescribed medicines   Lasting loss of appetite or rapid weight loss of five pounds in a week   Fatigue that interferes with your daily activities   No bowel movement for 3 days or you feel uncomfortable   Extreme weakness that interferes with normal activities   Bad abdominal pain, especially in upper right area   Decreased urine   Unusual thirst or passing urine often   Rash that is not relieved by prescribed medicines   Flu-like symptoms: fever, headache, muscle and joint aches, and fatigue (low energy, feeling weak)   Signs of liver problems: dark urine, pale bowel movements, bad stomach pain, feeling very tired and weak, unusual itching, or yellowing of the eyes or  skin   Signs of infusion reactions such as fever or shaking chills, flushing, facial swelling, feeling dizzy, headache, trouble breathing, rash, itching, chest tightness, or chest pain.   Reproduction Warnings   Pregnancy warning: This drug may have harmful effects on the unborn baby. Women of childbearing potential should use effective methods of birth control during your cancer treatment and for at least 4 months after treatment. Let your doctor know right away if you think you may be pregnant   Breast feeding warning: It is not known if this drug passes into breast milk. It is recommended that women do not breastfeed during treatment and for 4 months after treatment.   Fertility warning: Human fertility studies have not been done with this drug. Talk with your doctor or nurse if you plan to have children.  SELF CARE ACTIVITIES WHILE ON CHEMOTHERAPY/IMMUNOTHERAPY:  Hydration Increase your fluid intake 48 hours prior to treatment and drink at least 8 to 12 cups (64 ounces) of water/decaffeinated beverages per day after treatment. You can still have your cup of coffee or soda but these beverages do not count as part of your 8 to 12 cups that you need to drink daily. No alcohol intake.  Medications Continue taking your normal prescription medication as prescribed.  If you start any new herbal or new supplements please let us know first to make sure it is safe.  Mouth Care Have teeth cleaned professionally before starting treatment. Keep dentures and partial plates clean. Use soft toothbrush and do not use mouthwashes that contain alcohol. Biotene is a good mouthwash that is available at most pharmacies or may be ordered by calling [phone]. Use warm salt water gargles (1 teaspoon salt per 1 quart warm water) before and after meals and at bedtime. Or you may rinse with 2 tablespoons of three-percent hydrogen peroxide mixed in eight ounces of water. If you are still having problems with your  mouth or sores in your mouth please call the clinic. If you need dental work, please let the doctor know before you go for your appointment so that we can coordinate the best possible time for you in regards to your chemo regimen. You need to also let your dentist know that you are actively taking chemo. We may need to do labs prior to your dental appointment.  Skin Care Always use sunscreen that has not expired and with SPF (Sun Protection Factor) of 50 or higher. Wear hats to protect your head from the sun. Remember to use sunscreen on your hands, ears, face, & feet.  Use good moisturizing lotions such as udder cream, eucerin, or even Vaseline. Some chemotherapies can cause dry skin, color changes in your skin and nails.  Avoid long, hot showers or baths. Use gentle, fragrance-free soaps and laundry detergent. Use moisturizers, preferably creams or ointments rather than lotions because the thicker consistency is better at preventing skin dehydration. Apply the cream or ointment within 15 minutes of showering. Reapply moisturizer at night, and moisturize your hands every time after you wash them.   Infection Prevention Please wash your hands for at least 30 seconds using warm soapy water. Handwashing is the #1 way to prevent the spread of germs. Stay away from sick people or people who are getting over a cold. If you develop respiratory systems such as green/yellow mucus production or productive cough or persistent cough let us know and we will see if you need an antibiotic. It is a good idea to keep a pair of gloves on when going into grocery stores/[employer] to decrease your risk of coming into contact with germs on the carts, etc. Carry alcohol hand gel with you at all times and use it frequently if out in public. If your temperature reaches 100.5 or higher please call the clinic and let us know.  If it is after hours or on the weekend please go to the ER if your temperature is over 100.4.  Please  have your own personal thermometer at home to use.    Sex and bodily fluids If you are going to have sex, a condom must be used to protect the person that isn't taking immunotherapy. For a few days after treatment, immunotherapy can be excreted through your bodily fluids.  When using the toilet please close the lid and flush the toilet twice.  Do this for a few day after you have had immunotherapy.   Contraception It is not known for sure whether or not immunotherapy drugs can be passed on through semen or secretions from the vagina. Because of this some doctors advise people to use a barrier method if you have sex during treatment. This applies to vaginal, anal or oral sex.  Generally, doctors advise a barrier method only for the time you are actually having the treatment and for about a week after your treatment.  Advice like this can be worrying, but this does not mean that you have to avoid being intimate with your partner. You can still have close contact with your partner and continue to enjoy sex.  Animals If you have cats or birds we just ask that you not change the litter or change the cage.  Please have someone else do this for you while you are on immunotherapy.   Food Safety During and After Cancer Treatment Food safety is important for people both during and after cancer treatment. Cancer and cancer treatments, such as chemotherapy, radiation therapy, and stem cell/bone marrow transplantation, often weaken the immune system. This makes it harder for your body to protect itself from foodborne illness, also called food poisoning. Foodborne illness is caused by eating food that contains harmful bacteria, parasites, or viruses.  Foods to avoid Some foods have a higher risk of becoming tainted with bacteria. These include: Unwashed fresh fruit and vegetables, especially leafy vegetables that can hide dirt and other contaminants Raw sprouts, such as alfalfa sprouts Raw or undercooked  beef, especially ground beef, or other raw or undercooked meat and poultry Fatty, fried, or spicy foods immediately before or after treatment.  These can sit heavy on your stomach and make you feel nauseous. Raw or undercooked shellfish, such as oysters. Sushi and sashimi, which often contain raw fish.  Unpasteurized  beverages, such as unpasteurized fruit juices, raw milk, raw yogurt, or cider Undercooked eggs, such as soft boiled, over easy, and poached; raw, unpasteurized eggs; or foods made with raw egg, such as homemade raw cookie dough and homemade mayonnaise  Simple steps for food safety  Shop smart. Do not buy food stored or displayed in an unclean area. Do not buy bruised or damaged fruits or vegetables. Do not buy cans that have cracks, dents, or bulges. Pick up foods that can spoil at the end of your shopping trip and store them in a cooler on the way home.  Prepare and clean up foods carefully. Rinse all fresh fruits and vegetables under running water, and dry them with a clean towel or paper towel. Clean the top of cans before opening them. After preparing food, wash your hands for 20 seconds with hot water and soap. Pay special attention to areas between fingers and under nails. Clean your utensils and dishes with hot water and soap. Disinfect your kitchen and cutting boards using 1 teaspoon of liquid, unscented bleach mixed into 1 quart of water.    Dispose of old food. Eat canned and packaged food before its expiration date (the "use by" or "best before" date). Consume refrigerated leftovers within 3 to 4 days. After that time, throw out the food. Even if the food does not smell or look spoiled, it still may be unsafe. Some bacteria, such as Listeria, can grow even on foods stored in the refrigerator if they are kept for too long.  Take precautions when eating out. At restaurants, avoid buffets and salad bars where food sits out for a long time and comes in contact with many  people. Food can become contaminated when someone with a virus, often a norovirus, or another "bug" handles it. Put any leftover food in a "to-go" container yourself, rather than having the server do it. And, refrigerate leftovers as soon as you get home. Choose restaurants that are clean and that are willing to prepare your food as you order it cooked.    SYMPTOMS TO REPORT AS SOON AS POSSIBLE AFTER TREATMENT:  FEVER GREATER THAN 100.4 F CHILLS WITH OR WITHOUT FEVER NAUSEA AND VOMITING THAT IS NOT CONTROLLED WITH YOUR NAUSEA MEDICATION UNUSUAL SHORTNESS OF BREATH UNUSUAL BRUISING OR BLEEDING TENDERNESS IN MOUTH AND THROAT WITH OR WITHOUT PRESENCE OF ULCERS URINARY PROBLEMS BOWEL PROBLEMS UNUSUAL RASH     Wear comfortable clothing and clothing appropriate for easy access to any Portacath or PICC line. Let us know if there is anything that we can do to make your therapy better!   What to do if you need assistance after hours or on the weekends: CALL [phone].  HOLD on the line, do not hang up.  You will hear multiple messages but at the end you will be connected with a nurse triage line.  They will contact the doctor if necessary.  Most of the time they will be able to assist you.  Do not call the hospital operator.    I have been informed and understand all of the instructions given to me and have received a copy. I have been instructed to call the clinic [phone] or my family physician as soon as possible for continued medical care, if indicated. I do not have any more questions at this time but understand that I may call the [facility] or the Patient Navigator at [phone] during office hours should I have questions or need assistance in  obtaining follow-up care.

## 2021-07-31 NOTE — Sedation Documentation (Signed)
Pt awake and alert. Reviewed discharge instructions with pt and pt driver/roommate. All verbalize understanding. Pt vitals stable, IV removed, assisted to main entrance via wheelchair. Pt went home via private vehicle with roommate. Provided AVS.

## 2021-08-02 ENCOUNTER — Encounter (HOSPITAL_COMMUNITY): Payer: Self-pay | Admitting: Hematology

## 2021-08-02 ENCOUNTER — Other Ambulatory Visit (HOSPITAL_COMMUNITY): Payer: Self-pay

## 2021-08-02 ENCOUNTER — Inpatient Hospital Stay (HOSPITAL_COMMUNITY): Payer: Medicare Other

## 2021-08-02 DIAGNOSIS — Z122 Encounter for screening for malignant neoplasm of respiratory organs: Secondary | ICD-10-CM

## 2021-08-02 DIAGNOSIS — C3411 Malignant neoplasm of upper lobe, right bronchus or lung: Secondary | ICD-10-CM

## 2021-08-02 MED ORDER — LIDOCAINE-PRILOCAINE 2.5-2.5 % EX CREA: TOPICAL_CREAM | CUTANEOUS | 3 refills | Status: DC

## 2021-08-02 MED ORDER — PROCHLORPERAZINE MALEATE 10 MG PO TABS: 10.0000 mg | ORAL_TABLET | Freq: Four times a day (QID) | ORAL | 3 refills | Status: DC | PRN

## 2021-08-02 NOTE — Progress Notes (Signed)
Chemotherapy/immunotherapy education packet given and discussed with pt and family in detail.  Discussed diagnosis and staging, tx regimen, and intent of tx.  Reviewed chemotherapy/immunotherapy medications and side effects, as well as pre-medications.  Instructed on how to manage side effects at home, and when to call the clinic.  Importance of fever/chills discussed with pt and family. Discussed precautions to implement at home after receiving tx, as well as self care strategies. Phone numbers provided for clinic during regular working hours, also how to reach the clinic after hours and on weekends. Pt and family provided the opportunity to ask questions - all questions answered to pt's and family satisfaction.

## 2021-08-02 NOTE — Addendum Note (Signed)
Addended by: Derek Jack on: 08/02/2021 10:44 AM   Modules accepted: Orders

## 2021-08-02 NOTE — Progress Notes (Signed)
START ON PATHWAY REGIMEN - Non-Small Cell Lung     A cycle is every 21 days:     Nivolumab      Paclitaxel      Carboplatin   **Always confirm dose/schedule in your pharmacy ordering system**  Patient Characteristics: Preoperative or Nonsurgical Candidate (Clinical Staging), Stage II, Surgical Candidate, EGFR Negative and ALK Negative, AND Neoadjuvant Chemo/Immunotherapy Preferred, Nonsquamous Cell Therapeutic Status: Preoperative or Nonsurgical Candidate (Clinical Staging) AJCC T Category: cT1 AJCC N Category: cN1 AJCC M Category: cM0 AJCC 8 Stage Grouping: Unknown ALK Rearrangement Status: Negative Neoadjuvant Chemo/Immunotherapy Preferred<= Yes EGFR Mutation Status: Negative/Wild Type Histology: Nonsquamous Cell Intent of Therapy: Curative Intent, Discussed with Patient

## 2021-08-06 ENCOUNTER — Inpatient Hospital Stay (HOSPITAL_COMMUNITY): Payer: Medicare Other

## 2021-08-06 ENCOUNTER — Encounter (HOSPITAL_COMMUNITY): Payer: Self-pay | Admitting: Hematology

## 2021-08-06 ENCOUNTER — Encounter (HOSPITAL_COMMUNITY): Payer: Self-pay

## 2021-08-06 ENCOUNTER — Encounter (HOSPITAL_COMMUNITY): Payer: Self-pay | Admitting: *Deleted

## 2021-08-06 VITALS — BP 136/72 | HR 67 | Temp 97.4°F | Resp 18

## 2021-08-06 DIAGNOSIS — I10 Essential (primary) hypertension: Secondary | ICD-10-CM | POA: Diagnosis not present

## 2021-08-06 DIAGNOSIS — C3411 Malignant neoplasm of upper lobe, right bronchus or lung: Secondary | ICD-10-CM | POA: Diagnosis not present

## 2021-08-06 DIAGNOSIS — Z8673 Personal history of transient ischemic attack (TIA), and cerebral infarction without residual deficits: Secondary | ICD-10-CM | POA: Diagnosis not present

## 2021-08-06 DIAGNOSIS — M549 Dorsalgia, unspecified: Secondary | ICD-10-CM | POA: Diagnosis not present

## 2021-08-06 DIAGNOSIS — Z5112 Encounter for antineoplastic immunotherapy: Secondary | ICD-10-CM | POA: Diagnosis not present

## 2021-08-06 DIAGNOSIS — I2510 Atherosclerotic heart disease of native coronary artery without angina pectoris: Secondary | ICD-10-CM | POA: Diagnosis not present

## 2021-08-06 DIAGNOSIS — M25472 Effusion, left ankle: Secondary | ICD-10-CM | POA: Diagnosis not present

## 2021-08-06 DIAGNOSIS — E785 Hyperlipidemia, unspecified: Secondary | ICD-10-CM | POA: Diagnosis not present

## 2021-08-06 DIAGNOSIS — I252 Old myocardial infarction: Secondary | ICD-10-CM | POA: Diagnosis not present

## 2021-08-06 DIAGNOSIS — F17210 Nicotine dependence, cigarettes, uncomplicated: Secondary | ICD-10-CM | POA: Diagnosis not present

## 2021-08-06 DIAGNOSIS — M25471 Effusion, right ankle: Secondary | ICD-10-CM | POA: Diagnosis not present

## 2021-08-06 DIAGNOSIS — Z79899 Other long term (current) drug therapy: Secondary | ICD-10-CM | POA: Diagnosis not present

## 2021-08-06 DIAGNOSIS — I6782 Cerebral ischemia: Secondary | ICD-10-CM | POA: Diagnosis not present

## 2021-08-06 DIAGNOSIS — Z7982 Long term (current) use of aspirin: Secondary | ICD-10-CM | POA: Diagnosis not present

## 2021-08-06 DIAGNOSIS — R059 Cough, unspecified: Secondary | ICD-10-CM | POA: Diagnosis not present

## 2021-08-06 DIAGNOSIS — I6381 Other cerebral infarction due to occlusion or stenosis of small artery: Secondary | ICD-10-CM | POA: Diagnosis not present

## 2021-08-06 LAB — CBC WITH DIFFERENTIAL/PLATELET
Abs Immature Granulocytes: 0.02 10*3/uL (ref 0.00–0.07)
Basophils Absolute: 0.1 10*3/uL (ref 0.0–0.1)
Basophils Relative: 1 %
Eosinophils Absolute: 0.2 10*3/uL (ref 0.0–0.5)
Eosinophils Relative: 2 %
HCT: 39.6 % (ref 39.0–52.0)
Hemoglobin: 13 g/dL (ref 13.0–17.0)
Immature Granulocytes: 0 %
Lymphocytes Relative: 23 %
Lymphs Abs: 2 10*3/uL (ref 0.7–4.0)
MCH: 30.7 pg (ref 26.0–34.0)
MCHC: 32.8 g/dL (ref 30.0–36.0)
MCV: 93.4 fL (ref 80.0–100.0)
Monocytes Absolute: 0.5 10*3/uL (ref 0.1–1.0)
Monocytes Relative: 6 %
Neutro Abs: 6 10*3/uL (ref 1.7–7.7)
Neutrophils Relative %: 68 %
Platelets: 150 10*3/uL (ref 150–400)
RBC: 4.24 MIL/uL (ref 4.22–5.81)
RDW: 13.6 % (ref 11.5–15.5)
WBC: 8.7 10*3/uL (ref 4.0–10.5)
nRBC: 0 % (ref 0.0–0.2)

## 2021-08-06 LAB — COMPREHENSIVE METABOLIC PANEL
ALT: 11 U/L (ref 0–44)
AST: 14 U/L — ABNORMAL LOW (ref 15–41)
Albumin: 3.4 g/dL — ABNORMAL LOW (ref 3.5–5.0)
Alkaline Phosphatase: 52 U/L (ref 38–126)
Anion gap: 7 (ref 5–15)
BUN: 18 mg/dL (ref 8–23)
CO2: 25 mmol/L (ref 22–32)
Calcium: 8.8 mg/dL — ABNORMAL LOW (ref 8.9–10.3)
Chloride: 107 mmol/L (ref 98–111)
Creatinine, Ser: 1.01 mg/dL (ref 0.61–1.24)
GFR, Estimated: >60 mL/min (ref >60)
Glucose, Bld: 120 mg/dL — ABNORMAL HIGH (ref 70–99)
Potassium: 3.5 mmol/L (ref 3.5–5.1)
Sodium: 139 mmol/L (ref 135–145)
Total Bilirubin: 0.6 mg/dL (ref 0.3–1.2)
Total Protein: 7 g/dL (ref 6.5–8.1)

## 2021-08-06 LAB — MAGNESIUM: Magnesium: 1.9 mg/dL (ref 1.7–2.4)

## 2021-08-06 MED ORDER — SODIUM CHLORIDE 0.9 % IV SOLN: Freq: Once | INTRAVENOUS | Status: AC

## 2021-08-06 MED ORDER — SODIUM CHLORIDE 0.9 % IV SOLN
470.0000 mg | Freq: Once | INTRAVENOUS | Status: AC
  Administered 2021-08-06: 470 mg via INTRAVENOUS
  Filled 2021-08-06: qty 47

## 2021-08-06 MED ORDER — SODIUM CHLORIDE 0.9% FLUSH
10.0000 mL | INTRAVENOUS | Status: DC | PRN
  Administered 2021-08-06: 10 mL

## 2021-08-06 MED ORDER — SODIUM CHLORIDE 0.9 % IV SOLN
150.0000 mg | Freq: Once | INTRAVENOUS | Status: AC
  Administered 2021-08-06: 150 mg via INTRAVENOUS
  Filled 2021-08-06: qty 150

## 2021-08-06 MED ORDER — PALONOSETRON HCL INJECTION 0.25 MG/5ML
0.2500 mg | Freq: Once | INTRAVENOUS | Status: AC
  Administered 2021-08-06: 0.25 mg via INTRAVENOUS
  Filled 2021-08-06: qty 5

## 2021-08-06 MED ORDER — SODIUM CHLORIDE 0.9 % IV SOLN
10.0000 mg | Freq: Once | INTRAVENOUS | Status: AC
  Administered 2021-08-06: 10 mg via INTRAVENOUS
  Filled 2021-08-06: qty 10

## 2021-08-06 MED ORDER — HEPARIN SOD (PORK) LOCK FLUSH 100 UNIT/ML IV SOLN
500.0000 [IU] | Freq: Once | INTRAVENOUS | Status: AC | PRN
  Administered 2021-08-06: 500 [IU]

## 2021-08-06 MED ORDER — SODIUM CHLORIDE 0.9 % IV SOLN
500.0000 mg/m2 | Freq: Once | INTRAVENOUS | Status: AC
  Administered 2021-08-06: 1000 mg via INTRAVENOUS
  Filled 2021-08-06: qty 40

## 2021-08-06 NOTE — Progress Notes (Signed)
Pharmacist Chemotherapy Monitoring - Initial Assessment    Anticipated start date: 08/06/21   The following has been reviewed per standard work regarding the patient's treatment regimen: The patient's diagnosis, treatment plan and drug doses, and organ/hematologic function Lab orders and baseline tests specific to treatment regimen  The treatment plan start date, drug sequencing, and pre-medications Prior authorization status  Patient's documented medication list, including drug-drug interaction screen and prescriptions for anti-emetics and supportive care specific to the treatment regimen The drug concentrations, fluid compatibility, administration routes, and timing of the medications to be used The patient's access for treatment and lifetime cumulative dose history, if applicable  The patient's medication allergies and previous infusion related reactions, if applicable   Changes made to treatment plan:  N/A  Follow up needed:  N/A  B-12 given 07/26/21   Wynona Neat, [place], 08/06/2021  10:27 AM

## 2021-08-06 NOTE — Progress Notes (Signed)
Patient presents today for D1C1 of Pemetrexed and Carboplatin. Patient tolerated chemotherapy with no complaints voiced. Side effects with management reviewed understanding verbalized. Port site clean and dry with no bruising or swelling noted at site. Good blood return noted before and after administration of chemotherapy. Band aid applied. Patient left in satisfactory condition with VSS and no s/s of distress noted.

## 2021-08-06 NOTE — Patient Instructions (Signed)
[facility]  Discharge Instructions: Thank you for choosing [facility] to provide your oncology and hematology care.  If you have a lab appointment with the [facility], please come in thru the Main Entrance and check in at the main information desk.  Wear comfortable clothing and clothing appropriate for easy access to any Portacath or PICC line.   We strive to give you quality time with your provider. You may need to reschedule your appointment if you arrive late (15 or more minutes).  Arriving late affects you and other patients whose appointments are after yours.  Also, if you miss three or more appointments without notifying the office, you may be dismissed from the clinic at the provider's discretion.      For prescription refill requests, have your pharmacy contact our office and allow 72 hours for refills to be completed.    Today you received the following chemotherapy and/or immunotherapy agents Alimta and Carboplatin, return as scheduled. Carboplatin injection What is this medication? CARBOPLATIN (KAR boe pla tin) is a chemotherapy drug. It targets fast dividing cells, like cancer cells, and causes these cells to die. This medicine is used to treat ovarian cancer and many other cancers. This medicine may be used for other purposes; ask your health care provider or pharmacist if you have questions. COMMON BRAND NAME(S): Paraplatin What should I tell my care team before I take this medication? They need to know if you have any of these conditions: blood disorders hearing problems kidney disease recent or ongoing radiation therapy an unusual or allergic reaction to carboplatin, cisplatin, other chemotherapy, other medicines, foods, dyes, or preservatives pregnant or trying to get pregnant breast-feeding How should I use this medication? This drug is usually given as an infusion into a vein. It is administered in a hospital or clinic by a specially  trained health care professional. Talk to your pediatrician regarding the use of this medicine in children. Special care may be needed. Overdosage: If you think you have taken too much of this medicine contact a poison control center or emergency room at once. NOTE: This medicine is only for you. Do not share this medicine with others. What if I miss a dose? It is important not to miss a dose. Call your doctor or health care professional if you are unable to keep an appointment. What may interact with this medication? medicines for seizures medicines to increase blood counts like filgrastim, pegfilgrastim, sargramostim some antibiotics like amikacin, gentamicin, neomycin, streptomycin, tobramycin vaccines Talk to your doctor or health care professional before taking any of these medicines: acetaminophen aspirin ibuprofen ketoprofen naproxen This list may not describe all possible interactions. Give your health care provider a list of all the medicines, herbs, non-prescription drugs, or dietary supplements you use. Also tell them if you smoke, drink alcohol, or use illegal drugs. Some items may interact with your medicine. What should I watch for while using this medication? Your condition will be monitored carefully while you are receiving this medicine. You will need important blood work done while you are taking this medicine. This drug may make you feel generally unwell. This is not uncommon, as chemotherapy can affect healthy cells as well as cancer cells. Report any side effects. Continue your course of treatment even though you feel ill unless your doctor tells you to stop. In some cases, you may be given additional medicines to help with side effects. Follow all directions for their use. Call your doctor or  health care professional for advice if you get a fever, chills or sore throat, or other symptoms of a cold or flu. Do not treat yourself. This drug decreases your body's ability to  fight infections. Try to avoid being around people who are sick. This medicine may increase your risk to bruise or bleed. Call your doctor or health care professional if you notice any unusual bleeding. Be careful brushing and flossing your teeth or using a toothpick because you may get an infection or bleed more easily. If you have any dental work done, tell your dentist you are receiving this medicine. Avoid taking products that contain aspirin, acetaminophen, ibuprofen, naproxen, or ketoprofen unless instructed by your doctor. These medicines may hide a fever. Do not become pregnant while taking this medicine. Women should inform their doctor if they wish to become pregnant or think they might be pregnant. There is a potential for serious side effects to an unborn child. Talk to your health care professional or pharmacist for more information. Do not breast-feed an infant while taking this medicine. What side effects may I notice from receiving this medication? Side effects that you should report to your doctor or health care professional as soon as possible: allergic reactions like skin rash, itching or hives, swelling of the face, lips, or tongue signs of infection - fever or chills, cough, sore throat, pain or difficulty passing urine signs of decreased platelets or bleeding - bruising, pinpoint red spots on the skin, black, tarry stools, nosebleeds signs of decreased red blood cells - unusually weak or tired, fainting spells, lightheadedness breathing problems changes in hearing changes in vision chest pain high blood pressure low blood counts - This drug may decrease the number of white blood cells, red blood cells and platelets. You may be at increased risk for infections and bleeding. nausea and vomiting pain, swelling, redness or irritation at the injection site pain, tingling, numbness in the hands or feet problems with balance, talking, walking trouble passing urine or change in the  amount of urine Side effects that usually do not require medical attention (report to your doctor or health care professional if they continue or are bothersome): hair loss loss of appetite metallic taste in the mouth or changes in taste This list may not describe all possible side effects. Call your doctor for medical advice about side effects. You may report side effects to FDA at 1-800-FDA-1088. Where should I keep my medication? This drug is given in a hospital or clinic and will not be stored at home. NOTE: This sheet is a summary. It may not cover all possible information. If you have questions about this medicine, talk to your doctor, pharmacist, or health care provider.  2023 Elsevier/Gold Standard (2007-06-17 00:00:00)  Pemetrexed injection What is this medication? PEMETREXED (PEM e TREX ed) is a chemotherapy drug used to treat lung cancers like non-small cell lung cancer and mesothelioma. It may also be used to treat other cancers. This medicine may be used for other purposes; ask your health care provider or pharmacist if you have questions. COMMON BRAND NAME(S): Alimta, PEMFEXY What should I tell my care team before I take this medication? They need to know if you have any of these conditions: infection (especially a virus infection such as chickenpox, cold sores, or herpes) kidney disease low blood counts, like low white cell, platelet, or red cell counts lung or breathing disease, like asthma radiation therapy an unusual or allergic reaction to pemetrexed, other medicines, foods, dyes,  or preservative pregnant or trying to get pregnant breast-feeding How should I use this medication? This drug is given as an infusion into a vein. It is administered in a hospital or clinic by a specially trained health care professional. Talk to your pediatrician regarding the use of this medicine in children. Special care may be needed. Overdosage: If you think you have taken too much of  this medicine contact a poison control center or emergency room at once. NOTE: This medicine is only for you. Do not share this medicine with others. What if I miss a dose? It is important not to miss your dose. Call your doctor or health care professional if you are unable to keep an appointment. What may interact with this medication? This medicine may interact with the following medications: Ibuprofen This list may not describe all possible interactions. Give your health care provider a list of all the medicines, herbs, non-prescription drugs, or dietary supplements you use. Also tell them if you smoke, drink alcohol, or use illegal drugs. Some items may interact with your medicine. What should I watch for while using this medication? Visit your doctor for checks on your progress. This drug may make you feel generally unwell. This is not uncommon, as chemotherapy can affect healthy cells as well as cancer cells. Report any side effects. Continue your course of treatment even though you feel ill unless your doctor tells you to stop. In some cases, you may be given additional medicines to help with side effects. Follow all directions for their use. Call your doctor or health care professional for advice if you get a fever, chills or sore throat, or other symptoms of a cold or flu. Do not treat yourself. This drug decreases your body's ability to fight infections. Try to avoid being around people who are sick. This medicine may increase your risk to bruise or bleed. Call your doctor or health care professional if you notice any unusual bleeding. Be careful brushing and flossing your teeth or using a toothpick because you may get an infection or bleed more easily. If you have any dental work done, tell your dentist you are receiving this medicine. Avoid taking products that contain aspirin, acetaminophen, ibuprofen, naproxen, or ketoprofen unless instructed by your doctor. These medicines may hide a  fever. Call your doctor or health care professional if you get diarrhea or mouth sores. Do not treat yourself. To protect your kidneys, drink water or other fluids as directed while you are taking this medicine. Do not become pregnant while taking this medicine or for 6 months after stopping it. Women should inform their doctor if they wish to become pregnant or think they might be pregnant. Men should not father a child while taking this medicine and for 3 months after stopping it. This may interfere with the ability to father a child. You should talk to your doctor or health care professional if you are concerned about your fertility. There is a potential for serious side effects to an unborn child. Talk to your health care professional or pharmacist for more information. Do not breast-feed an infant while taking this medicine or for 1 week after stopping it. What side effects may I notice from receiving this medication? Side effects that you should report to your doctor or health care professional as soon as possible: allergic reactions like skin rash, itching or hives, swelling of the face, lips, or tongue breathing problems redness, blistering, peeling or loosening of the skin, including inside the  mouth signs and symptoms of bleeding such as bloody or black, tarry stools; red or dark-brown urine; spitting up blood or brown material that looks like coffee grounds; red spots on the skin; unusual bruising or bleeding from the eye, gums, or nose signs and symptoms of infection like fever or chills; cough; sore throat; pain or trouble passing urine signs and symptoms of kidney injury like trouble passing urine or change in the amount of urine signs and symptoms of liver injury like dark yellow or brown urine; general ill feeling or flu-like symptoms; light-colored stools; loss of appetite; nausea; right upper belly pain; unusually weak or tired; yellowing of the eyes or skin Side effects that usually  do not require medical attention (report to your doctor or health care professional if they continue or are bothersome): constipation mouth sores nausea, vomiting unusually weak or tired This list may not describe all possible side effects. Call your doctor for medical advice about side effects. You may report side effects to FDA at 1-800-FDA-1088. Where should I keep my medication? This drug is given in a hospital or clinic and will not be stored at home. NOTE: This sheet is a summary. It may not cover all possible information. If you have questions about this medicine, talk to your doctor, pharmacist, or health care provider.  2023 Elsevier/Gold Standard (2017-03-04 00:00:00)    To help prevent nausea and vomiting after your treatment, we encourage you to take your nausea medication as directed.  BELOW ARE SYMPTOMS THAT SHOULD BE REPORTED IMMEDIATELY: *FEVER GREATER THAN 100.4 F (38 C) OR HIGHER *CHILLS OR SWEATING *NAUSEA AND VOMITING THAT IS NOT CONTROLLED WITH YOUR NAUSEA MEDICATION *UNUSUAL SHORTNESS OF BREATH *UNUSUAL BRUISING OR BLEEDING *URINARY PROBLEMS (pain or burning when urinating, or frequent urination) *BOWEL PROBLEMS (unusual diarrhea, constipation, pain near the anus) TENDERNESS IN MOUTH AND THROAT WITH OR WITHOUT PRESENCE OF ULCERS (sore throat, sores in mouth, or a toothache) UNUSUAL RASH, SWELLING OR PAIN  UNUSUAL VAGINAL DISCHARGE OR ITCHING   Items with * indicate a potential emergency and should be followed up as soon as possible or go to the Emergency Department if any problems should occur.  Please show the CHEMOTHERAPY ALERT CARD or IMMUNOTHERAPY ALERT CARD at check-in to the Emergency Department and triage nurse.  Should you have questions after your visit or need to cancel or reschedule your appointment, please contact [facility] [phone]  and follow the prompts.  Office hours are 8:00 a.m. to 4:30 p.m. Monday - Friday. Please note that  voicemails left after 4:00 p.m. may not be returned until the following business day.  We are closed weekends and major holidays. You have access to a nurse at all times for urgent questions. Please call the main number to the clinic [phone] and follow the prompts.  For any non-urgent questions, you may also contact your provider using MyChart. We now offer e-Visits for anyone 52 and older to request care online for non-urgent symptoms. For details visit [email or web address].   Also download the MyChart app! Go to the app store, search "MyChart", open the app, select [facility], and log in with your MyChart username and password.  Masks are optional in the cancer centers. If you would like for your care team to wear a mask while they are taking care of you, please let them know. For doctor visits, patients may have with them one support person who is at least 74 years old. At this time, visitors are not  allowed in the infusion area.

## 2021-08-06 NOTE — Progress Notes (Signed)
Approval received from [facility] for Lidocaine 2.5% cream.  Effective dates 08/02/21-08/03/22.

## 2021-08-07 ENCOUNTER — Telehealth (HOSPITAL_COMMUNITY): Payer: Self-pay

## 2021-08-07 NOTE — Telephone Encounter (Signed)
Patient called for 24-hour follow-up. No response from patient.

## 2021-08-13 ENCOUNTER — Inpatient Hospital Stay (HOSPITAL_BASED_OUTPATIENT_CLINIC_OR_DEPARTMENT_OTHER): Payer: Medicare Other | Admitting: Hematology

## 2021-08-13 ENCOUNTER — Other Ambulatory Visit: Payer: Self-pay

## 2021-08-13 ENCOUNTER — Inpatient Hospital Stay (HOSPITAL_COMMUNITY): Payer: Medicare Other

## 2021-08-13 VITALS — Ht 70.0 in | Wt 164.3 lb

## 2021-08-13 DIAGNOSIS — I10 Essential (primary) hypertension: Secondary | ICD-10-CM | POA: Diagnosis not present

## 2021-08-13 DIAGNOSIS — I252 Old myocardial infarction: Secondary | ICD-10-CM | POA: Diagnosis not present

## 2021-08-13 DIAGNOSIS — E785 Hyperlipidemia, unspecified: Secondary | ICD-10-CM | POA: Diagnosis not present

## 2021-08-13 DIAGNOSIS — C3411 Malignant neoplasm of upper lobe, right bronchus or lung: Secondary | ICD-10-CM | POA: Diagnosis not present

## 2021-08-13 DIAGNOSIS — Z5112 Encounter for antineoplastic immunotherapy: Secondary | ICD-10-CM | POA: Diagnosis not present

## 2021-08-13 DIAGNOSIS — M25471 Effusion, right ankle: Secondary | ICD-10-CM | POA: Diagnosis not present

## 2021-08-13 DIAGNOSIS — I6782 Cerebral ischemia: Secondary | ICD-10-CM | POA: Diagnosis not present

## 2021-08-13 DIAGNOSIS — F17210 Nicotine dependence, cigarettes, uncomplicated: Secondary | ICD-10-CM | POA: Diagnosis not present

## 2021-08-13 DIAGNOSIS — I2510 Atherosclerotic heart disease of native coronary artery without angina pectoris: Secondary | ICD-10-CM | POA: Diagnosis not present

## 2021-08-13 DIAGNOSIS — Z8673 Personal history of transient ischemic attack (TIA), and cerebral infarction without residual deficits: Secondary | ICD-10-CM | POA: Diagnosis not present

## 2021-08-13 DIAGNOSIS — R059 Cough, unspecified: Secondary | ICD-10-CM | POA: Diagnosis not present

## 2021-08-13 DIAGNOSIS — Z7982 Long term (current) use of aspirin: Secondary | ICD-10-CM | POA: Diagnosis not present

## 2021-08-13 DIAGNOSIS — M25472 Effusion, left ankle: Secondary | ICD-10-CM | POA: Diagnosis not present

## 2021-08-13 DIAGNOSIS — M549 Dorsalgia, unspecified: Secondary | ICD-10-CM | POA: Diagnosis not present

## 2021-08-13 DIAGNOSIS — I6381 Other cerebral infarction due to occlusion or stenosis of small artery: Secondary | ICD-10-CM | POA: Diagnosis not present

## 2021-08-13 DIAGNOSIS — Z79899 Other long term (current) drug therapy: Secondary | ICD-10-CM | POA: Diagnosis not present

## 2021-08-13 LAB — CBC WITH DIFFERENTIAL/PLATELET
Abs Immature Granulocytes: 0.03 10*3/uL (ref 0.00–0.07)
Basophils Absolute: 0 10*3/uL (ref 0.0–0.1)
Basophils Relative: 0 %
Eosinophils Absolute: 0.2 10*3/uL (ref 0.0–0.5)
Eosinophils Relative: 7 %
HCT: 37.4 % — ABNORMAL LOW (ref 39.0–52.0)
Hemoglobin: 12.6 g/dL — ABNORMAL LOW (ref 13.0–17.0)
Immature Granulocytes: 1 %
Lymphocytes Relative: 38 %
Lymphs Abs: 1.1 10*3/uL (ref 0.7–4.0)
MCH: 31 pg (ref 26.0–34.0)
MCHC: 33.7 g/dL (ref 30.0–36.0)
MCV: 92.1 fL (ref 80.0–100.0)
Monocytes Absolute: 0.1 10*3/uL (ref 0.1–1.0)
Monocytes Relative: 2 %
Neutro Abs: 1.5 10*3/uL — ABNORMAL LOW (ref 1.7–7.7)
Neutrophils Relative %: 52 %
Platelets: 57 10*3/uL — ABNORMAL LOW (ref 150–400)
RBC: 4.06 MIL/uL — ABNORMAL LOW (ref 4.22–5.81)
RDW: 13.2 % (ref 11.5–15.5)
WBC: 3 10*3/uL — ABNORMAL LOW (ref 4.0–10.5)
nRBC: 0 % (ref 0.0–0.2)

## 2021-08-13 LAB — COMPREHENSIVE METABOLIC PANEL
ALT: 14 U/L (ref 0–44)
AST: 16 U/L (ref 15–41)
Albumin: 3.3 g/dL — ABNORMAL LOW (ref 3.5–5.0)
Alkaline Phosphatase: 51 U/L (ref 38–126)
Anion gap: 7 (ref 5–15)
BUN: 19 mg/dL (ref 8–23)
CO2: 24 mmol/L (ref 22–32)
Calcium: 9 mg/dL (ref 8.9–10.3)
Chloride: 106 mmol/L (ref 98–111)
Creatinine, Ser: 0.97 mg/dL (ref 0.61–1.24)
GFR, Estimated: >60 mL/min (ref >60)
Glucose, Bld: 100 mg/dL — ABNORMAL HIGH (ref 70–99)
Potassium: 3.9 mmol/L (ref 3.5–5.1)
Sodium: 137 mmol/L (ref 135–145)
Total Bilirubin: 0.6 mg/dL (ref 0.3–1.2)
Total Protein: 6.7 g/dL (ref 6.5–8.1)

## 2021-08-13 LAB — MAGNESIUM: Magnesium: 1.9 mg/dL (ref 1.7–2.4)

## 2021-08-13 NOTE — Progress Notes (Signed)
Darryl Ward, [place] [id number]   CLINIC:  Medical Oncology/Hematology  PCP:  [name], NP [name] 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  PRIOR THERAPY: none  NGS Results: not done  CURRENT THERAPY: Neoadjuvant chemoimmunotherapy  BRIEF ONCOLOGIC HISTORY:  Oncology History  Primary adenocarcinoma of upper lobe of right lung ([name])  06/26/2021 Initial Diagnosis   Primary adenocarcinoma of upper lobe of right lung ([name])   08/06/2021 -  Chemotherapy   Patient is on Treatment Plan : LUNG NSCLC Pemetrexed + Carboplatin q21d x 4 Cycles       CANCER STAGING: Cancer Staging  Primary adenocarcinoma of upper lobe of right lung ([name]) Staging form: Lung, AJCC 8th Edition - Clinical stage from 06/26/2021: cT1, cN1, cM0 - Unsigned   INTERVAL HISTORY:  Mr. ADOLPHE Ward, a 76 y.o. male, returns for routine follow-up of his right upper lobe lung adenocarcinoma. [name] was last seen on 07/26/2021.   Today he reports feeling good. He denies tingling/numbness, nausea, vomiting, aches and pains. His appetite is fair, but he is eating smaller portions. He has lost 4 lbs since his last visit.   REVIEW OF SYSTEMS:  Review of Systems  Constitutional:  Positive for unexpected weight change (-4 lbs). Negative for appetite change and fatigue.  Gastrointestinal:  Negative for nausea and vomiting.  Musculoskeletal:  Negative for arthralgias.  Neurological:  Negative for numbness.  All other systems reviewed and are negative.   PAST MEDICAL/SURGICAL HISTORY:  Past Medical History:  Diagnosis Date   AAA (abdominal aortic aneurysm) (HCC)    Arthritis    Carotid artery disease ([place])    Nonobstructive   Cataract    Coronary atherosclerosis of native coronary artery    PTCA small diagonal 2007 otherwise nonobstructive CAD, EF 50-55%   Essential hypertension, benign     Hyperlipidemia    NSTEMI (non-ST elevated myocardial infarction) ([place]) 2007   Stroke [facility]) 2004   TIA (transient ischemic attack) 2006   Past Surgical History:  Procedure Laterality Date   AORTA - BILATERAL FEMORAL ARTERY BYPASS GRAFT  01/08/2012   Procedure: AORTA BIFEMORAL BYPASS GRAFT;  Surgeon: Elam Dutch, MD;  Location: [facility] OR;  Service: Vascular;  Laterality: Bilateral;  using 18x66m x 40cm Hemashield Gold Vascular Graft with Endarterectomy, Thombectomy and  Reimplantation of Inferior Mesenteric Artery   BRONCHIAL BIOPSY  06/11/2021   Procedure: BRONCHIAL BIOPSIES;  Surgeon: BCollene Gobble MD;  [facility];  Service: Pulmonary;;   BRONCHIAL BRUSHINGS  06/11/2021   Procedure: BRONCHIAL BRUSHINGS;  Surgeon: BCollene Gobble MD;  [facility];  Service: Pulmonary;;   BRONCHIAL NEEDLE ASPIRATION BIOPSY  06/11/2021   Procedure: BRONCHIAL NEEDLE ASPIRATION BIOPSIES;  Surgeon: BCollene Gobble MD;  [facility];  Service: Pulmonary;;   COLONOSCOPY N/A 04/14/2019   Procedure: COLONOSCOPY;  Surgeon: RDaneil Dolin MD;  Location: [facility] ENDO SU[facility];  Service: Endoscopy;  Laterality: N/A;  9:30   COLONOSCOPY WITH PROPOFOL N/A 07/25/2021   Procedure: COLONOSCOPY WITH PROPOFOL;  Surgeon: CEloise Harman DO;  [facility];  Service: Endoscopy;  Laterality: N/A;  1:00pm   FIDUCIAL MARKER PLACEMENT  06/11/2021   Procedure: FIDUCIAL MARKER PLACEMENT;  Surgeon: BCollene Gobble MD;  [facility];  Service: Pulmonary;;   IR IMAGING GUIDED PORT INSERTION  07/31/2021   Left cataract surgery  POLYPECTOMY  04/14/2019   Procedure: POLYPECTOMY;  Surgeon: Daneil Dolin, MD;  [facility];  Service: Endoscopy;;   POLYPECTOMY  07/25/2021   Procedure: POLYPECTOMY;  Surgeon: Eloise Harman, DO;  [facility];  Service: Endoscopy;;   TRANSFORAMINAL LUMBAR INTERBODY FUSION (TLIF) WITH PEDICLE SCREW FIXATION 1 LEVEL N/A 04/27/2020    Procedure: Transforaminal Lumbar Interbody Fusion Lumbar Five-Sacral One;  Surgeon: Vallarie Mare, MD;  Location: [facility];  Service: Neurosurgery;  Laterality: N/A;   VIDEO BRONCHOSCOPY WITH RADIAL ENDOBRONCHIAL ULTRASOUND  06/11/2021   Procedure: VIDEO BRONCHOSCOPY WITH RADIAL ENDOBRONCHIAL ULTRASOUND;  Surgeon: [name], MD;  Location[facility] ENDOS[facility];  Service: Pulmonary;;    SOCIAL HISTORY:  Social History   Socioeconomic History   Marital status: Divorced    Spouse name: Not on file   Number of children: 1   Years of education: 11   Highest education level: 11th grade  Occupational History    Employer: Probation officer  Tobacco Use   Smoking status: Every Day    Packs/day: 1.00    Years: 40.00    Total pack years: 40.00    Types: Cigarettes   Smokeless tobacco: Never   Tobacco comments:    1 pack of cigarettes smoked daily. 07/17/21 ARJ, RN   Vaping Use   Vaping Use: Never used  Substance and Sexual Activity   Alcohol use: No    Comment: Prior history of regular alcohol use   Drug use: No   Sexual activity: Not Currently  Other Topics Concern   Not on file  Social History Narrative   Not on file   Social Determinants of Health   Financial Resource Strain: Low Risk  (11/29/2019)   Overall Financial Resource Strain (CARDIA)    Difficulty of Paying Living Expenses: Not hard at all  Food Insecurity: No Food Insecurity (11/29/2019)   Hunger Vital Sign    Worried About Running Out of Food in the Last Year: Never true    Ran Out of Food in the Last Year: Never true  Transportation Needs: No Transportation Needs (11/29/2019)   PRAPARE - Hydrologist (Medical): No    Lack of Transportation (Non-Medical): No  Physical Activity: Inactive (11/29/2019)   Exercise Vital Sign    Days of Exercise per Week: 0 days    Minutes of Exercise per Session: 0 min  Stress: No Stress Concern Present (11/29/2019)   [place]    Feeling of Stress : Only a little  Social Connections: Moderately Isolated (11/29/2019)   Social Connection and Isolation Panel [NHANES]    Frequency of Communication with Friends and Family: More than three times a week    Frequency of Social Gatherings with Friends and Family: More than three times a week    Attends Religious Services: 1 to 4 times per year    Active Member of [employer] or Organizations: No    Attends Archivist Meetings: Never    Marital Status: Divorced  Human resources officer Violence: Not At Risk (11/20/2018)   Humiliation, Afraid, Rape, and Kick questionnaire    Fear of Current or Ex-Partner: No    Emotionally Abused: No    Physically Abused: No    Sexually Abused: No    FAMILY HISTORY:  Family History  Problem Relation Age of Onset   Hyperlipidemia Sister    Heart attack Brother 31   Cancer - Colon  Neg Hx     CURRENT MEDICATIONS:  Current Outpatient Medications  Medication Sig Dispense Refill   aspirin EC 81 MG tablet Take 1 tablet (81 mg total) by mouth daily. Okay to restart this medication on 06/12/2021 90 tablet 3   atorvastatin (LIPITOR) 80 MG tablet Take 80 mg by mouth daily.     CARBOPLATIN IV Inject into the vein every 21 ( twenty-one) days.     folic acid (FOLVITE) 1 MG tablet Take 1 tablet (1 mg total) by mouth daily. 90 tablet 2   furosemide (LASIX) 20 MG tablet Take 1 tablet (20 mg total) by mouth daily as needed. 30 tablet 2   lidocaine-prilocaine (EMLA) cream Apply a small amount to port a cath site and cover with plastic wrap 1 hour prior to infusion appointments 30 g 3   metoprolol succinate (TOPROL-XL) 25 MG 24 hr tablet Take 1 tablet (25 mg total) by mouth daily. 90 tablet 3   oxyCODONE-acetaminophen (PERCOCET/ROXICET) 5-325 MG tablet Take 1-2 tablets by mouth every 4 (four) hours as needed for moderate pain or severe pain. 45 tablet 0   [START ON 08/27/2021] Pembrolizumab (KEYTRUDA IV) Inject into the  vein every 21 ( twenty-one) days. (Patient not taking: Reported on 08/06/2021)     PEMETREXED IV Inject into the vein every 21 ( twenty-one) days.     prochlorperazine (COMPAZINE) 10 MG tablet Take 1 tablet (10 mg total) by mouth every 6 (six) hours as needed (Nausea or vomiting). 60 tablet 3   No current facility-administered medications for this visit.    ALLERGIES:  No Known Allergies  PHYSICAL EXAM:  Performance status (ECOG): 1 - Symptomatic but completely ambulatory  There were no vitals filed for this visit. Wt Readings from Last 3 Encounters:  08/06/21 164 lb 14.4 oz (74.8 kg)  07/31/21 175 lb (79.4 kg)  07/26/21 168 lb 12.8 oz (76.6 kg)   Physical Exam Vitals reviewed.  Constitutional:      Appearance: Normal appearance.  Cardiovascular:     Rate and Rhythm: Normal rate and regular rhythm.     Pulses: Normal pulses.     Heart sounds: Normal heart sounds.  Pulmonary:     Effort: Pulmonary effort is normal.     Breath sounds: Normal breath sounds.  Neurological:     General: No focal deficit present.     Mental Status: He is alert and oriented to person, place, and time.  Psychiatric:        Mood and Affect: Mood normal.        Behavior: Behavior normal.      LABORATORY DATA:  I have reviewed the labs as listed.     Latest Ref Rng & Units 08/06/2021    9:09 AM 06/11/2021    7:51 AM 07/13/2020   10:06 AM  CBC  WBC 4.0 - 10.5 K/uL 8.7  8.8  11.6   Hemoglobin 13.0 - 17.0 g/dL 13.0  12.5  13.1   Hematocrit 39.0 - 52.0 % 39.6  38.6  38.4   Platelets 150 - 400 K/uL 150  163  198       Latest Ref Rng & Units 08/06/2021    9:09 AM 06/11/2021    7:51 AM 07/13/2020   10:06 AM  CMP  Glucose 70 - 99 mg/dL 120  87  84   BUN 8 - 23 mg/dL 18  11  10    Creatinine 0.61 - 1.24 mg/dL 1.01  0.82  0.94  Sodium 135 - 145 mmol/L 139  137  139   Potassium 3.5 - 5.1 mmol/L 3.5  4.8  4.5   Chloride 98 - 111 mmol/L 107  110  105   CO2 22 - 32 mmol/L 25  22  20    Calcium 8.9 -  10.3 mg/dL 8.8  8.8  9.4   Total Protein 6.5 - 8.1 g/dL 7.0   6.8   Total Bilirubin 0.3 - 1.2 mg/dL 0.6   0.4   Alkaline Phos 38 - 126 U/L 52   73   AST 15 - 41 U/L 14   12   ALT 0 - 44 U/L 11   7     DIAGNOSTIC IMAGING:  I have independently reviewed the scans and discussed with the patient. IR IMAGING GUIDED PORT INSERTION  Result Date: 07/31/2021 INDICATION: RIGHT lung cancer.  Chemotherapy initiation. EXAM: IMPLANTED PORT A CATH PLACEMENT WITH ULTRASOUND AND FLUOROSCOPIC GUIDANCE MEDICATIONS: None ANESTHESIA/SEDATION: Moderate (conscious) sedation was employed during this procedure. A total of Versed 2 mg and Fentanyl 100 mcg was administered intravenously. Moderate Sedation Time: 23 minutes. The patient's level of consciousness and vital signs were monitored continuously by radiology nursing throughout the procedure under my direct supervision. FLUOROSCOPY TIME:  Fluoroscopic dose; 1 mGy COMPLICATIONS: None immediate. PROCEDURE: The procedure, risks, benefits, and alternatives were explained to the patient. Questions regarding the procedure were encouraged and answered. The patient understands and consents to the procedure. The LEFT neck and chest were prepped with chlorhexidine in a sterile fashion, and a sterile drape was applied covering the operative field. Maximum barrier sterile technique with sterile gowns and gloves were used for the procedure. A timeout was performed prior to the initiation of the procedure. Local anesthesia was provided with 1% lidocaine with epinephrine. After creating a small venotomy incision, a micropuncture kit was utilized to access the internal jugular vein under direct, real-time ultrasound guidance. Ultrasound image documentation was performed. The microwire was kinked to measure appropriate catheter length. A subcutaneous port pocket was then created along the upper chest wall utilizing a combination of sharp and blunt dissection. The pocket was irrigated with  sterile saline. A single lumen ISP power injectable port was chosen for placement. The 8 Fr catheter was tunneled from the port pocket site to the venotomy incision. The port was placed in the pocket. The external catheter was trimmed to appropriate length. At the venotomy, an 8 Fr peel-away sheath was placed over a guidewire under fluoroscopic guidance. The catheter was then placed through the sheath and the sheath was removed. Final catheter positioning was confirmed and documented with a fluoroscopic spot radiograph. The port was accessed with a Huber needle, aspirated and flushed with heparinized saline. The port pocket incision was closed with interrupted 3-0 Vicryl suture then Dermabond was applied, including at the venotomy incision. Dressings were placed. The patient tolerated the procedure well without immediate post procedural complication. IMPRESSION: Successful placement of a LEFT internal jugular approach power injectable Port-A-Cath. The tip of the catheter is positioned within the proximal RIGHT atrium. The catheter is ready for immediate use. Michaelle Birks, MD [facility] Electronically Signed   By: Michaelle Birks M.D.   On: 07/31/2021 14:17     ASSESSMENT:  Stage II (T1 N1 M0) right upper lobe adenocarcinoma: - CT chest on 06/08/2021: 1.2 x 1.1 cm subsolid subpleural nodule of the peripheral posterior right upper lobe.  Occasional additional small pulmonary nodules measuring 0.4 cm and  smaller. - Bronchoscopy (06/11/2021): RUL nodule brushing and FNA: Malignant cells with features of adenocarcinoma. - PET scan (06/22/2021): Subsolid nodular lesion in the right upper lobe, hypermetabolic SUV 15.  Small right hilar and infrahilar lymph nodes with SUV 4.03 concerning for metastatic adenopathy.  Small hypermetabolic left parotid gland lesion, consistent with previous history of Wharton's tumor.  Hypermetabolic focus in the transverse colon SUV  8.69. - MRI of the brain (07/11/2021): No evidence of metastatic disease.  Right sphenoid wing meningioma stable since 2022. - He was evaluated by Dr. Roxan Hockey and was recommended to undergo neoadjuvant chemoimmunotherapy followed by surgical resection. - Cycle 1 of carboplatin and pemetrexed on 08/06/2021. - NGS testing not performed due to insufficient sample.    Social/family history: - Lives at home with his wife.  Uses cane occasionally after he had stroke in January 2022.  He has retired after working in [employer].  He is current active smoker, 1 pack/day for 60 years.  No exposure to chemicals. - Brother died of metastatic cancer.  Maternal uncle had lung cancer.   PLAN:  Stage II (T1 N1 M0) right upper lobe adenocarcinoma: - He has received first cycle of carboplatin and pemetrexed on 08/06/2021. - He did not have any major nausea or vomiting.  He did lose some weight. - I have reviewed his labs today which showed normal LFTs and creatinine.  CBC shows leukopenia and thrombocytopenia from myelosuppression. - Recommend [employer] testing to evaluate for EGFR and ALK mutations.  We will send a sample today.  If they are found to be negative, we will add immunotherapy to cycle 2. - RTC 2 weeks for cycle 2.  2.  Abdominal uptake on PET scan: - Colonoscopy (04/14/2019): With multiple adenomatous polyps removed. - Uptake area appears to be in the transverse colon lesion. - Referral was made to Dr. Gala Romney for colonoscopy.   Orders placed this encounter:  No orders of the defined types were placed in this encounter.    Derek Jack, MD [place] [phone]   I, Thana Ates, am acting as a scribe for Dr. Derek Jack.  I, Derek Jack MD, have reviewed the above documentation for accuracy and completeness, and I agree with the above.

## 2021-08-13 NOTE — Patient Instructions (Addendum)
[place] at [facility] Discharge Instructions   You were seen and examined today by Dr. Delton Coombes.  He reviewed your lab work today which are normal/stable. Your white blood cell count and platelets are a little low, but this is an expected side effect of the chemo.   The special testing we sent on your biopsy specimen was not able to be done due to not having enough tissue to test the specimen.   Return as scheduled in 2 weeks.      Thank you for choosing [place] at [facility] to provide your oncology and hematology care.  To afford each patient quality time with our provider, please arrive at least 15 minutes before your scheduled appointment time.   If you have a lab appointment with the [facility] please come in thru the Main Entrance and check in at the main information desk.  You need to re-schedule your appointment should you arrive 10 or more minutes late.  We strive to give you quality time with our providers, and arriving late affects you and other patients whose appointments are after yours.  Also, if you no show three or more times for appointments you may be dismissed from the clinic at the providers discretion.     Again, thank you for choosing [facility].  Our hope is that these requests will decrease the amount of time that you wait before being seen by our physicians.       _____________________________________________________________  Should you have questions after your visit to [facility], please contact our office at [phone] and follow the prompts.  Our office hours are 8:00 a.m. and 4:30 p.m. Monday - Friday.  Please note that voicemails left after 4:00 p.m. may not be returned until the following business day.  We are closed weekends and major holidays.  You do have access to a nurse 24-7, just call the main number to the clinic [phone] and do not press any options, hold on  the line and a nurse will answer the phone.    For prescription refill requests, have your pharmacy contact our office and allow 72 hours.    Due to Covid, you will need to wear a mask upon entering the hospital. If you do not have a mask, a mask will be given to you at the Main Entrance upon arrival. For doctor visits, patients may have 1 support person age 60 or older with them. For treatment visits, patients can not have anyone with them due to social distancing guidelines and our immunocompromised population.

## 2021-08-14 ENCOUNTER — Other Ambulatory Visit: Payer: Self-pay

## 2021-08-16 ENCOUNTER — Encounter (HOSPITAL_COMMUNITY): Payer: Self-pay

## 2021-08-16 DIAGNOSIS — C3490 Malignant neoplasm of unspecified part of unspecified bronchus or lung: Secondary | ICD-10-CM | POA: Diagnosis not present

## 2021-08-16 DIAGNOSIS — I10 Essential (primary) hypertension: Secondary | ICD-10-CM | POA: Diagnosis not present

## 2021-08-16 DIAGNOSIS — L03115 Cellulitis of right lower limb: Secondary | ICD-10-CM | POA: Diagnosis not present

## 2021-08-16 DIAGNOSIS — L559 Sunburn, unspecified: Secondary | ICD-10-CM | POA: Diagnosis not present

## 2021-08-16 NOTE — Progress Notes (Signed)
[name], patient's friend and emergency contact, called reporting blistering, discoloration and swelling on patient's legs following sunburn. Patient directed to Urgent Care for further work-up.

## 2021-08-21 ENCOUNTER — Other Ambulatory Visit: Payer: Self-pay

## 2021-08-27 ENCOUNTER — Inpatient Hospital Stay (HOSPITAL_BASED_OUTPATIENT_CLINIC_OR_DEPARTMENT_OTHER): Payer: Medicare Other | Admitting: Hematology

## 2021-08-27 ENCOUNTER — Inpatient Hospital Stay: Payer: Medicare Other

## 2021-08-27 ENCOUNTER — Inpatient Hospital Stay: Payer: Medicare Other | Attending: Hematology

## 2021-08-27 VITALS — BP 104/59 | HR 52 | Temp 96.5°F | Resp 18

## 2021-08-27 DIAGNOSIS — I10 Essential (primary) hypertension: Secondary | ICD-10-CM | POA: Diagnosis not present

## 2021-08-27 DIAGNOSIS — I252 Old myocardial infarction: Secondary | ICD-10-CM | POA: Diagnosis not present

## 2021-08-27 DIAGNOSIS — Z7982 Long term (current) use of aspirin: Secondary | ICD-10-CM | POA: Diagnosis not present

## 2021-08-27 DIAGNOSIS — Z298 Encounter for other specified prophylactic measures: Secondary | ICD-10-CM

## 2021-08-27 DIAGNOSIS — L03115 Cellulitis of right lower limb: Secondary | ICD-10-CM | POA: Insufficient documentation

## 2021-08-27 DIAGNOSIS — Z5111 Encounter for antineoplastic chemotherapy: Secondary | ICD-10-CM | POA: Diagnosis not present

## 2021-08-27 DIAGNOSIS — I714 Abdominal aortic aneurysm, without rupture, unspecified: Secondary | ICD-10-CM | POA: Diagnosis not present

## 2021-08-27 DIAGNOSIS — Z8673 Personal history of transient ischemic attack (TIA), and cerebral infarction without residual deficits: Secondary | ICD-10-CM | POA: Insufficient documentation

## 2021-08-27 DIAGNOSIS — C3411 Malignant neoplasm of upper lobe, right bronchus or lung: Secondary | ICD-10-CM

## 2021-08-27 DIAGNOSIS — Z5112 Encounter for antineoplastic immunotherapy: Secondary | ICD-10-CM | POA: Diagnosis not present

## 2021-08-27 DIAGNOSIS — F17210 Nicotine dependence, cigarettes, uncomplicated: Secondary | ICD-10-CM | POA: Diagnosis not present

## 2021-08-27 DIAGNOSIS — I2510 Atherosclerotic heart disease of native coronary artery without angina pectoris: Secondary | ICD-10-CM | POA: Diagnosis not present

## 2021-08-27 DIAGNOSIS — Z79899 Other long term (current) drug therapy: Secondary | ICD-10-CM | POA: Insufficient documentation

## 2021-08-27 DIAGNOSIS — L03116 Cellulitis of left lower limb: Secondary | ICD-10-CM | POA: Diagnosis not present

## 2021-08-27 DIAGNOSIS — Z7952 Long term (current) use of systemic steroids: Secondary | ICD-10-CM | POA: Diagnosis not present

## 2021-08-27 DIAGNOSIS — E785 Hyperlipidemia, unspecified: Secondary | ICD-10-CM | POA: Diagnosis not present

## 2021-08-27 LAB — COMPREHENSIVE METABOLIC PANEL
ALT: 21 U/L (ref 0–44)
AST: 16 U/L (ref 15–41)
Albumin: 3.2 g/dL — ABNORMAL LOW (ref 3.5–5.0)
Alkaline Phosphatase: 63 U/L (ref 38–126)
Anion gap: 7 (ref 5–15)
BUN: 23 mg/dL (ref 8–23)
CO2: 24 mmol/L (ref 22–32)
Calcium: 8.8 mg/dL — ABNORMAL LOW (ref 8.9–10.3)
Chloride: 105 mmol/L (ref 98–111)
Creatinine, Ser: 1.15 mg/dL (ref 0.61–1.24)
GFR, Estimated: >60 mL/min (ref >60)
Glucose, Bld: 95 mg/dL (ref 70–99)
Potassium: 3.7 mmol/L (ref 3.5–5.1)
Sodium: 136 mmol/L (ref 135–145)
Total Bilirubin: 0.5 mg/dL (ref 0.3–1.2)
Total Protein: 7.2 g/dL (ref 6.5–8.1)

## 2021-08-27 LAB — CBC WITH DIFFERENTIAL/PLATELET
Abs Immature Granulocytes: 0.1 10*3/uL — ABNORMAL HIGH (ref 0.00–0.07)
Basophils Absolute: 0 10*3/uL (ref 0.0–0.1)
Basophils Relative: 0 %
Eosinophils Absolute: 0 10*3/uL (ref 0.0–0.5)
Eosinophils Relative: 1 %
HCT: 35.3 % — ABNORMAL LOW (ref 39.0–52.0)
Hemoglobin: 12 g/dL — ABNORMAL LOW (ref 13.0–17.0)
Immature Granulocytes: 1 %
Lymphocytes Relative: 25 %
Lymphs Abs: 2.2 10*3/uL (ref 0.7–4.0)
MCH: 31.3 pg (ref 26.0–34.0)
MCHC: 34 g/dL (ref 30.0–36.0)
MCV: 92.2 fL (ref 80.0–100.0)
Monocytes Absolute: 1.3 10*3/uL — ABNORMAL HIGH (ref 0.1–1.0)
Monocytes Relative: 15 %
Neutro Abs: 5.1 10*3/uL (ref 1.7–7.7)
Neutrophils Relative %: 58 %
Platelets: 157 10*3/uL (ref 150–400)
RBC: 3.83 MIL/uL — ABNORMAL LOW (ref 4.22–5.81)
RDW: 14.2 % (ref 11.5–15.5)
WBC: 8.7 10*3/uL (ref 4.0–10.5)
nRBC: 0 % (ref 0.0–0.2)

## 2021-08-27 LAB — MAGNESIUM: Magnesium: 1.9 mg/dL (ref 1.7–2.4)

## 2021-08-27 LAB — TSH: TSH: 1.402 u[IU]/mL (ref 0.350–4.500)

## 2021-08-27 MED ORDER — SODIUM CHLORIDE 0.9 % IV SOLN
150.0000 mg | Freq: Once | INTRAVENOUS | Status: AC
  Administered 2021-08-27: 150 mg via INTRAVENOUS
  Filled 2021-08-27: qty 5

## 2021-08-27 MED ORDER — SODIUM CHLORIDE 0.9 % IV SOLN
10.0000 mg | Freq: Once | INTRAVENOUS | Status: AC
  Administered 2021-08-27: 10 mg via INTRAVENOUS
  Filled 2021-08-27: qty 10

## 2021-08-27 MED ORDER — SODIUM CHLORIDE 0.9 % IV SOLN
500.0000 mg/m2 | Freq: Once | INTRAVENOUS | Status: AC
  Administered 2021-08-27: 1000 mg via INTRAVENOUS
  Filled 2021-08-27: qty 40

## 2021-08-27 MED ORDER — PALONOSETRON HCL INJECTION 0.25 MG/5ML
0.2500 mg | Freq: Once | INTRAVENOUS | Status: AC
  Administered 2021-08-27: 0.25 mg via INTRAVENOUS
  Filled 2021-08-27: qty 5

## 2021-08-27 MED ORDER — SODIUM CHLORIDE 0.9 % IV SOLN
430.0000 mg | Freq: Once | INTRAVENOUS | Status: AC
  Administered 2021-08-27: 430 mg via INTRAVENOUS
  Filled 2021-08-27: qty 43

## 2021-08-27 MED ORDER — SODIUM CHLORIDE 0.9 % IV SOLN
360.0000 mg | Freq: Once | INTRAVENOUS | Status: AC
  Administered 2021-08-27: 360 mg via INTRAVENOUS
  Filled 2021-08-27: qty 16

## 2021-08-27 MED ORDER — HEPARIN SOD (PORK) LOCK FLUSH 100 UNIT/ML IV SOLN
500.0000 [IU] | Freq: Once | INTRAVENOUS | Status: AC | PRN
  Administered 2021-08-27: 500 [IU]

## 2021-08-27 MED ORDER — SODIUM CHLORIDE 0.9% FLUSH
10.0000 mL | INTRAVENOUS | Status: DC | PRN
  Administered 2021-08-27: 10 mL

## 2021-08-27 MED ORDER — SODIUM CHLORIDE 0.9 % IV SOLN: Freq: Once | INTRAVENOUS | Status: AC

## 2021-08-27 NOTE — Progress Notes (Signed)
Patient presents today for treatment, patient okay for treatment per Dr. Delton Coombes, Armida Sans added to patient's treatment plan, new consent signed. Patient tolerated chemotherapy with no complaints voiced. Side effects with management reviewed understanding verbalized. Port site clean and dry with no bruising or swelling noted at site. Good blood return noted before and after administration of chemotherapy. Band aid applied. Patient left in satisfactory condition with VSS and no s/s of distress noted.

## 2021-08-27 NOTE — Progress Notes (Signed)
Decreasing dose of Prednisone to 10 mg daily if still on at home.  Proceed with Opdivo infusion.  T.O. Dr Rhys Martini, PharmD

## 2021-08-27 NOTE — Progress Notes (Signed)
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  PRIOR THERAPY: none  NGS Results: not done  CURRENT THERAPY: Neoadjuvant chemoimmunotherapy  BRIEF ONCOLOGIC HISTORY:  Oncology History  Primary adenocarcinoma of upper lobe of right lung ([place])  06/26/2021 Initial Diagnosis   Primary adenocarcinoma of upper lobe of right lung ([place])   08/06/2021 -  Chemotherapy   Patient is on Treatment Plan : LUNG NSCLC Pemetrexed + Carboplatin q21d x 4 Cycles       CANCER STAGING:  Cancer Staging  Primary adenocarcinoma of upper lobe of right lung ([place]) Staging form: Lung, AJCC 8th Edition - Clinical stage from 06/26/2021: cT1, cN1, cM0 - Unsigned   INTERVAL HISTORY:  [name], a 76 y.o. male, seen for follow-up of right lung cancer.  He has tolerated first cycle of chemotherapy reasonably well.  He reportedly had cellulitis in the legs and was treated with antibiotics and some steroids.  He had some tiredness during the second and third week.  Denies any major GI symptoms.  Appetite is improving.  REVIEW OF SYSTEMS:  Review of Systems  Constitutional:  Negative for appetite change, fatigue and unexpected weight change.  Gastrointestinal:  Negative for nausea and vomiting.  Musculoskeletal:  Negative for arthralgias.  Neurological:  Negative for numbness.  All other systems reviewed and are negative.   PAST MEDICAL/SURGICAL HISTORY:  Past Medical History:  Diagnosis Date   AAA (abdominal aortic aneurysm) (HCC)    Arthritis    Carotid artery disease ([place])    Nonobstructive   Cataract    Coronary atherosclerosis of native coronary artery    PTCA small diagonal 2007 otherwise nonobstructive CAD, EF 50-55%   Essential hypertension, benign    Hyperlipidemia     NSTEMI (non-ST elevated myocardial infarction) ([place]) 2007   Stroke [facility]) 2004   TIA (transient ischemic attack) 2006   Past Surgical History:  Procedure Laterality Date   AORTA - BILATERAL FEMORAL ARTERY BYPASS GRAFT  01/08/2012   Procedure: AORTA BIFEMORAL BYPASS GRAFT;  Surgeon: Elam Dutch, MD;  Location: [facility] OR;  Service: Vascular;  Laterality: Bilateral;  using 18x59m x 40cm Hemashield Gold Vascular Graft with Endarterectomy, Thombectomy and  Reimplantation of Inferior Mesenteric Artery   BRONCHIAL BIOPSY  06/11/2021   Procedure: BRONCHIAL BIOPSIES;  Surgeon: BCollene Gobble MD;  [facility];  Service: Pulmonary;;   BRONCHIAL BRUSHINGS  06/11/2021   Procedure: BRONCHIAL BRUSHINGS;  Surgeon: BCollene Gobble MD;  [facility];  Service: Pulmonary;;   BRONCHIAL NEEDLE ASPIRATION BIOPSY  06/11/2021   Procedure: BRONCHIAL NEEDLE ASPIRATION BIOPSIES;  Surgeon: BCollene Gobble MD;  [facility];  Service: Pulmonary;;   COLONOSCOPY N/A 04/14/2019   Procedure: COLONOSCOPY;  Surgeon: RDaneil Dolin MD;  Location[facility] ENDO SUITE;  Service: Endoscopy;  Laterality: N/A;  9:30   COLONOSCOPY WITH PROPOFOL N/A 07/25/2021   Procedure: COLONOSCOPY WITH PROPOFOL;  Surgeon: CEloise Harman DO;  [facility] END[facility];  Service: Endoscopy;  Laterality: N/A;  1:00pm   FIDUCIAL MARKER PLACEMENT  06/11/2021   Procedure: FIDUCIAL MARKER PLACEMENT;  Surgeon: BCollene Gobble MD;  [facility];  Service: Pulmonary;;   IR IMAGING GUIDED PORT INSERTION  07/31/2021   Left cataract surgery  POLYPECTOMY  04/14/2019   Procedure: POLYPECTOMY;  Surgeon: Daneil Dolin, MD;  [facility];  Service: Endoscopy;;   POLYPECTOMY  07/25/2021   Procedure: POLYPECTOMY;  Surgeon: Eloise Harman, DO;  [facility];  Service: Endoscopy;;   TRANSFORAMINAL LUMBAR INTERBODY FUSION (TLIF) WITH PEDICLE SCREW FIXATION 1 LEVEL N/A 04/27/2020   Procedure: Transforaminal  Lumbar Interbody Fusion Lumbar Five-Sacral One;  Surgeon: Vallarie Mare, MD;  Location: [facility];  Service: Neurosurgery;  Laterality: N/A;   VIDEO BRONCHOSCOPY WITH RADIAL ENDOBRONCHIAL ULTRASOUND  06/11/2021   Procedure: VIDEO BRONCHOSCOPY WITH RADIAL ENDOBRONCHIAL ULTRASOUND;  Surgeon: [name], MD;  Location: [facility] ENDOSCOPY;  Service: Pulmonary;;    SOCIAL HISTORY:  Social History   Socioeconomic History   Marital status: Divorced    Spouse name: Not on file   Number of children: 1   Years of education: 11   Highest education level: 11th grade  Occupational History    Employer: Probation officer  Tobacco Use   Smoking status: Every Day    Packs/day: 1.00    Years: 40.00    Total pack years: 40.00    Types: Cigarettes   Smokeless tobacco: Never   Tobacco comments:    1 pack of cigarettes smoked daily. 07/17/21 ARJ, RN   Vaping Use   Vaping Use: Never used  Substance and Sexual Activity   Alcohol use: No    Comment: Prior history of regular alcohol use   Drug use: No   Sexual activity: Not Currently  Other Topics Concern   Not on file  Social History Narrative   Not on file   Social Determinants of Health   Financial Resource Strain: Low Risk  (11/29/2019)   Overall Financial Resource Strain (CARDIA)    Difficulty of Paying Living Expenses: Not hard at all  Food Insecurity: No Food Insecurity (11/29/2019)   Hunger Vital Sign    Worried About Running Out of Food in the Last Year: Never true    Ran Out of Food in the Last Year: Never true  Transportation Needs: No Transportation Needs (11/29/2019)   PRAPARE - Hydrologist (Medical): No    Lack of Transportation (Non-Medical): No  Physical Activity: Inactive (11/29/2019)   Exercise Vital Sign    Days of Exercise per Week: 0 days    Minutes of Exercise per Session: 0 min  Stress: No Stress Concern Present (11/29/2019)   [place]    Feeling of Stress : Only a little  Social Connections: Moderately Isolated (11/29/2019)   Social Connection and Isolation Panel [NHANES]    Frequency of Communication with Friends and Family: More than three times a week    Frequency of Social Gatherings with Friends and Family: More than three times a week    Attends Religious Services: 1 to 4 times per year    Active Member of [employer] or Organizations: No    Attends Archivist Meetings: Never    Marital Status: Divorced  Human resources officer Violence: Not At Risk (11/20/2018)   Humiliation, Afraid, Rape, and Kick questionnaire    Fear of Current or Ex-Partner: No    Emotionally Abused: No    Physically Abused: No    Sexually Abused: No    FAMILY HISTORY:  Family History  Problem Relation Age of Onset   Hyperlipidemia Sister    Heart attack Brother 51   Cancer - Colon  Neg Hx     CURRENT MEDICATIONS:  Current Outpatient Medications  Medication Sig Dispense Refill   aspirin EC 81 MG tablet Take 1 tablet (81 mg total) by mouth daily. Okay to restart this medication on 06/12/2021 90 tablet 3   atorvastatin (LIPITOR) 80 MG tablet Take 80 mg by mouth daily.     CARBOPLATIN IV Inject into the vein every 21 ( twenty-one) days.     cyclobenzaprine (FLEXERIL) 10 MG tablet      doxycycline (VIBRA-TABS) 100 MG tablet Take by mouth 2 (two) times daily.     folic acid (FOLVITE) 1 MG tablet Take 1 tablet (1 mg total) by mouth daily. 90 tablet 2   furosemide (LASIX) 20 MG tablet Take 1 tablet (20 mg total) by mouth daily as needed. 30 tablet 2   lidocaine-prilocaine (EMLA) cream Apply a small amount to port a cath site and cover with plastic wrap 1 hour prior to infusion appointments 30 g 3   metoprolol succinate (TOPROL-XL) 25 MG 24 hr tablet Take 1 tablet (25 mg total) by mouth daily. 90 tablet 3   oxyCODONE-acetaminophen (PERCOCET/ROXICET) 5-325 MG tablet Take 1-2 tablets by mouth every 4 (four) hours as needed for  moderate pain or severe pain. 45 tablet 0   PEMETREXED IV Inject into the vein every 21 ( twenty-one) days.     predniSONE (DELTASONE) 20 MG tablet Take 40 mg by mouth daily.     prochlorperazine (COMPAZINE) 10 MG tablet Take 1 tablet (10 mg total) by mouth every 6 (six) hours as needed (Nausea or vomiting). 60 tablet 3   triamcinolone cream (KENALOG) 0.1 % Apply topically 2 (two) times daily.     No current facility-administered medications for this visit.   Facility-Administered Medications Ordered in Other Visits  Medication Dose Route Frequency Provider Last Rate Last Admin   sodium chloride flush (NS) 0.9 % injection 10 mL  10 mL Intracatheter PRN Derek Jack, MD   10 mL at 08/27/21 1445    ALLERGIES:  No Known Allergies  PHYSICAL EXAM:  Performance status (ECOG): 1 - Symptomatic but completely ambulatory  There were no vitals filed for this visit. Wt Readings from Last 3 Encounters:  08/27/21 164 lb 6.4 oz (74.6 kg)  08/13/21 164 lb 4.8 oz (74.5 kg)  08/06/21 164 lb 14.4 oz (74.8 kg)   Physical Exam Vitals reviewed.  Constitutional:      Appearance: Normal appearance.  Cardiovascular:     Rate and Rhythm: Normal rate and regular rhythm.     Pulses: Normal pulses.     Heart sounds: Normal heart sounds.  Pulmonary:     Effort: Pulmonary effort is normal.     Breath sounds: Normal breath sounds.  Neurological:     General: No focal deficit present.     Mental Status: He is alert and oriented to person, place, and time.  Psychiatric:        Mood and Affect: Mood normal.        Behavior: Behavior normal.      LABORATORY DATA:  I have reviewed the labs as listed.     Latest Ref Rng & Units 08/27/2021   10:01 AM 08/13/2021   12:14 PM 08/06/2021    9:09 AM  CBC  WBC 4.0 - 10.5 K/uL 8.7  3.0  8.7   Hemoglobin 13.0 - 17.0 g/dL 12.0  12.6  13.0   Hematocrit 39.0 - 52.0 % 35.3  37.4  39.6   Platelets  150 - 400 K/uL 157  57  150       Latest Ref Rng & Units  08/27/2021   10:01 AM 08/13/2021   12:14 PM 08/06/2021    9:09 AM  CMP  Glucose 70 - 99 mg/dL 95  100  120   BUN 8 - 23 mg/dL _0 Creatinine 0.61 - 1.24 mg/dL 1.15  0.97  1.01   Sodium 135 - 145 mmol/L 136  137  139   Potassium 3.5 - 5.1 mmol/L 3.7  3.9  3.5   Chloride 98 - 111 mmol/L 105  106  107   CO2 22 - 32 mmol/L _1 Calcium 8.9 - 10.3 mg/dL 8.8  9.0  8.8   Total Protein 6.5 - 8.1 g/dL 7.2  6.7  7.0   Total Bilirubin 0.3 - 1.2 mg/dL 0.5  0.6  0.6   Alkaline Phos 38 - 126 U/L 63  51  52   AST 15 - 41 U/L _2 ALT 0 - 44 U/L _3 DIAGNOSTIC IMAGING:  I have independently reviewed the scans and discussed with the patient. IR IMAGING GUIDED PORT INSERTION  Result Date: 07/31/2021 INDICATION: RIGHT lung cancer.  Chemotherapy initiation. EXAM: IMPLANTED PORT A CATH PLACEMENT WITH ULTRASOUND AND FLUOROSCOPIC GUIDANCE MEDICATIONS: None ANESTHESIA/SEDATION: Moderate (conscious) sedation was employed during this procedure. A total of Versed 2 mg and Fentanyl 100 mcg was administered intravenously. Moderate Sedation Time: 23 minutes. The patient's level of consciousness and vital signs were monitored continuously by radiology nursing throughout the procedure under my direct supervision. FLUOROSCOPY TIME:  Fluoroscopic dose; 1 mGy COMPLICATIONS: None immediate. PROCEDURE: The procedure, risks, benefits, and alternatives were explained to the patient. Questions regarding the procedure were encouraged and answered. The patient understands and consents to the procedure. The LEFT neck and chest were prepped with chlorhexidine in a sterile fashion, and a sterile drape was applied covering the operative field. Maximum barrier sterile technique with sterile gowns and gloves were used for the procedure. A timeout was performed prior to the initiation of the procedure. Local anesthesia was provided with 1% lidocaine with epinephrine. After creating a small venotomy incision,  a micropuncture kit was utilized to access the internal jugular vein under direct, real-time ultrasound guidance. Ultrasound image documentation was performed. The microwire was kinked to measure appropriate catheter length. A subcutaneous port pocket was then created along the upper chest wall utilizing a combination of sharp and blunt dissection. The pocket was irrigated with sterile saline. A single lumen ISP power injectable port was chosen for placement. The 8 Fr catheter was tunneled from the port pocket site to the venotomy incision. The port was placed in the pocket. The external catheter was trimmed to appropriate length. At the venotomy, an 8 Fr peel-away sheath was placed over a guidewire under fluoroscopic guidance. The catheter was then placed through the sheath and the sheath was removed. Final catheter positioning was confirmed and documented with a fluoroscopic spot radiograph. The port was accessed with a Huber needle, aspirated and flushed with heparinized saline. The port pocket incision was closed with interrupted 3-0 Vicryl suture then Dermabond was applied, including at the venotomy incision. Dressings were placed. The patient tolerated the procedure well without immediate post procedural complication. IMPRESSION: Successful placement of a LEFT internal jugular approach power injectable Port-A-Cath. The tip of the catheter is positioned  within the proximal RIGHT atrium. The catheter is ready for immediate use. Michaelle Birks, MD [facility] Radiology Electronically Signed   By: Michaelle Birks M.D.   On: 07/31/2021 14:17     ASSESSMENT:  Stage II (T1 N1 M0) right upper lobe adenocarcinoma: - CT chest on 06/08/2021: 1.2 x 1.1 cm subsolid subpleural nodule of the peripheral posterior right upper lobe.  Occasional additional small pulmonary nodules measuring 0.4 cm and smaller. - Bronchoscopy (06/11/2021): RUL nodule brushing and FNA: Malignant cells  with features of adenocarcinoma. - PET scan (06/22/2021): Subsolid nodular lesion in the right upper lobe, hypermetabolic SUV 15.  Small right hilar and infrahilar lymph nodes with SUV 4.03 concerning for metastatic adenopathy.  Small hypermetabolic left parotid gland lesion, consistent with previous history of Wharton's tumor.  Hypermetabolic focus in the transverse colon SUV 8.69. - MRI of the brain (07/11/2021): No evidence of metastatic disease.  Right sphenoid wing meningioma stable since 2022. - He was evaluated by Dr. Roxan Hockey and was recommended to undergo neoadjuvant chemoimmunotherapy followed by surgical resection. - Cycle 1 of carboplatin and pemetrexed on 08/06/2021. - NGS testing not performed due to insufficient sample. - Guardant360: Negative for EGFR and ALK.  MSI high not detected. - Cycle 2 carboplatin, pemetrexed and opdivo on 08/27/2021.    Social/family history: - Lives at home with his wife.  Uses cane occasionally after he had stroke in January 2022.  He has retired after working in [employer]  He is current active smoker, 1 pack/day for 60 years.  No exposure to chemicals. - Brother died of metastatic cancer.  Maternal uncle had lung cancer.   PLAN:  Stage II (T1 N1 M0) right upper lobe adenocarcinoma: - We reviewed results of Guardant360 which was negative for EGFR and ALK mutation. - We talked about starting him on opdivo along with chemotherapy. - We discussed side effects in detail. - Reviewed labs today which showed normal LFTs.  CBC was grossly normal.  TSH was 1.4.  We will proceed with cycle 2 with opdivo today. - RTC 3 weeks for follow-up.  2.  Abdominal uptake on PET scan: - Colonoscopy (04/14/2019): With multiple adenomatous polyps removed. - Uptake area appears to be in the transverse colon lesion. - Referral was made to Dr. Gala Romney for colonoscopy.   Orders placed this encounter:  Orders Placed This Encounter  Procedures   CT Chest W Contrast   TSH       Derek Jack, MD [place] [phone]   I, Thana Ates, am acting as a scribe for Dr. Derek Jack.  I, Derek Jack MD, have reviewed the above documentation for accuracy and completeness, and I agree with the above.

## 2021-08-27 NOTE — Patient Instructions (Addendum)
[place] at [facility] Discharge Instructions   You were seen and examined today by Dr. Delton Coombes.  He reviewed the results of you lab work which are normal/stable.   We will plan to add immunotherapy to your treatment today since the special blood test we sent off was negative for certain mutation.   We will proceed with your treatment today.  Return as scheduled in 3 weeks.    Thank you for choosing [place] at [facility] to provide your oncology and hematology care.  To afford each patient quality time with our provider, please arrive at least 15 minutes before your scheduled appointment time.   If you have a lab appointment with the [facility] please come in thru the Main Entrance and check in at the main information desk.  You need to re-schedule your appointment should you arrive 10 or more minutes late.  We strive to give you quality time with our providers, and arriving late affects you and other patients whose appointments are after yours.  Also, if you no show three or more times for appointments you may be dismissed from the clinic at the providers discretion.     Again, thank you for choosing [facility].  Our hope is that these requests will decrease the amount of time that you wait before being seen by our physicians.       _____________________________________________________________  Should you have questions after your visit to [facility], please contact our office at [phone] and follow the prompts.  Our office hours are 8:00 a.m. and 4:30 p.m. Monday - Friday.  Please note that voicemails left after 4:00 p.m. may not be returned until the following business day.  We are closed weekends and major holidays.  You do have access to a nurse 24-7, just call the main number to the clinic [phone] and do not press any options, hold on the line and a nurse will answer the phone.    For  prescription refill requests, have your pharmacy contact our office and allow 72 hours.    Due to Covid, you will need to wear a mask upon entering the hospital. If you do not have a mask, a mask will be given to you at the Main Entrance upon arrival. For doctor visits, patients may have 1 support person age 34 or older with them. For treatment visits, patients can not have anyone with them due to social distancing guidelines and our immunocompromised population.

## 2021-08-27 NOTE — Patient Instructions (Signed)
[facility]  Discharge Instructions: Thank you for choosing [facility] to provide your oncology and hematology care.  If you have a lab appointment with the [facility], please come in thru the Main Entrance and check in at the main information desk.  Wear comfortable clothing and clothing appropriate for easy access to any Portacath or PICC line.   We strive to give you quality time with your provider. You may need to reschedule your appointment if you arrive late (15 or more minutes).  Arriving late affects you and other patients whose appointments are after yours.  Also, if you miss three or more appointments without notifying the office, you may be dismissed from the clinic at the provider's discretion.      For prescription refill requests, have your pharmacy contact our office and allow 72 hours for refills to be completed.    Today you received the following chemotherapy and/or immunotherapy agents Opdivo, Almita, and Carboplatin, return as scheduled.   To help prevent nausea and vomiting after your treatment, we encourage you to take your nausea medication as directed.  BELOW ARE SYMPTOMS THAT SHOULD BE REPORTED IMMEDIATELY: *FEVER GREATER THAN 100.4 F (38 C) OR HIGHER *CHILLS OR SWEATING *NAUSEA AND VOMITING THAT IS NOT CONTROLLED WITH YOUR NAUSEA MEDICATION *UNUSUAL SHORTNESS OF BREATH *UNUSUAL BRUISING OR BLEEDING *URINARY PROBLEMS (pain or burning when urinating, or frequent urination) *BOWEL PROBLEMS (unusual diarrhea, constipation, pain near the anus) TENDERNESS IN MOUTH AND THROAT WITH OR WITHOUT PRESENCE OF ULCERS (sore throat, sores in mouth, or a toothache) UNUSUAL RASH, SWELLING OR PAIN  UNUSUAL VAGINAL DISCHARGE OR ITCHING   Items with * indicate a potential emergency and should be followed up as soon as possible or go to the Emergency Department if any problems should occur.  Please show the CHEMOTHERAPY ALERT CARD or  IMMUNOTHERAPY ALERT CARD at check-in to the Emergency Department and triage nurse.  Should you have questions after your visit or need to cancel or reschedule your appointment, please contact [facility] [phone]  and follow the prompts.  Office hours are 8:00 a.m. to 4:30 p.m. Monday - Friday. Please note that voicemails left after 4:00 p.m. may not be returned until the following business day.  We are closed weekends and major holidays. You have access to a nurse at all times for urgent questions. Please call the main number to the clinic [phone] and follow the prompts.  For any non-urgent questions, you may also contact your provider using MyChart. We now offer e-Visits for anyone 24 and older to request care online for non-urgent symptoms. For details visit [email or web address].   Also download the MyChart app! Go to the app store, search "MyChart", open the app, select [facility], and log in with your MyChart username and password.  Masks are optional in the cancer centers. If you would like for your care team to wear a mask while they are taking care of you, please let them know. For doctor visits, patients may have with them one support person who is at least 74 years old. At this time, visitors are not allowed in the infusion area.

## 2021-08-28 ENCOUNTER — Telehealth: Payer: Self-pay

## 2021-08-28 NOTE — Telephone Encounter (Signed)
Patient call for a 24-hour call back, no response from patient.

## 2021-09-17 ENCOUNTER — Inpatient Hospital Stay (HOSPITAL_COMMUNITY)
Admission: EM | Admit: 2021-09-17 | Discharge: 2021-09-21 | DRG: 308 | Disposition: A | Discharge status: Home / Self Care | Payer: Medicare Other | Attending: Family Medicine | Admitting: Family Medicine

## 2021-09-17 ENCOUNTER — Other Ambulatory Visit: Payer: Self-pay

## 2021-09-17 ENCOUNTER — Inpatient Hospital Stay: Payer: Medicare Other | Admitting: Hematology

## 2021-09-17 ENCOUNTER — Inpatient Hospital Stay: Payer: Medicare Other

## 2021-09-17 ENCOUNTER — Inpatient Hospital Stay (HOSPITAL_COMMUNITY): Payer: Medicare Other

## 2021-09-17 ENCOUNTER — Emergency Department (HOSPITAL_COMMUNITY): Payer: Medicare Other

## 2021-09-17 VITALS — BP 100/61 | HR 69 | Temp 97.8°F | Resp 17 | Ht 70.0 in | Wt 156.9 lb

## 2021-09-17 DIAGNOSIS — Z7982 Long term (current) use of aspirin: Secondary | ICD-10-CM | POA: Diagnosis not present

## 2021-09-17 DIAGNOSIS — R531 Weakness: Secondary | ICD-10-CM

## 2021-09-17 DIAGNOSIS — E782 Mixed hyperlipidemia: Secondary | ICD-10-CM | POA: Diagnosis present

## 2021-09-17 DIAGNOSIS — Z79899 Other long term (current) drug therapy: Secondary | ICD-10-CM | POA: Diagnosis not present

## 2021-09-17 DIAGNOSIS — Z8673 Personal history of transient ischemic attack (TIA), and cerebral infarction without residual deficits: Secondary | ICD-10-CM

## 2021-09-17 DIAGNOSIS — R42 Dizziness and giddiness: Secondary | ICD-10-CM | POA: Diagnosis not present

## 2021-09-17 DIAGNOSIS — C3411 Malignant neoplasm of upper lobe, right bronchus or lung: Secondary | ICD-10-CM | POA: Diagnosis not present

## 2021-09-17 DIAGNOSIS — D696 Thrombocytopenia, unspecified: Secondary | ICD-10-CM | POA: Diagnosis not present

## 2021-09-17 DIAGNOSIS — I739 Peripheral vascular disease, unspecified: Secondary | ICD-10-CM | POA: Diagnosis not present

## 2021-09-17 DIAGNOSIS — Z981 Arthrodesis status: Secondary | ICD-10-CM

## 2021-09-17 DIAGNOSIS — I371 Nonrheumatic pulmonary valve insufficiency: Secondary | ICD-10-CM | POA: Diagnosis not present

## 2021-09-17 DIAGNOSIS — Z7901 Long term (current) use of anticoagulants: Secondary | ICD-10-CM

## 2021-09-17 DIAGNOSIS — I4819 Other persistent atrial fibrillation: Secondary | ICD-10-CM | POA: Diagnosis not present

## 2021-09-17 DIAGNOSIS — D630 Anemia in neoplastic disease: Secondary | ICD-10-CM | POA: Diagnosis present

## 2021-09-17 DIAGNOSIS — Z83438 Family history of other disorder of lipoprotein metabolism and other lipidemia: Secondary | ICD-10-CM

## 2021-09-17 DIAGNOSIS — I4891 Unspecified atrial fibrillation: Secondary | ICD-10-CM

## 2021-09-17 DIAGNOSIS — C3490 Malignant neoplasm of unspecified part of unspecified bronchus or lung: Secondary | ICD-10-CM

## 2021-09-17 DIAGNOSIS — I428 Other cardiomyopathies: Secondary | ICD-10-CM | POA: Diagnosis present

## 2021-09-17 DIAGNOSIS — E876 Hypokalemia: Secondary | ICD-10-CM | POA: Diagnosis present

## 2021-09-17 DIAGNOSIS — N179 Acute kidney failure, unspecified: Secondary | ICD-10-CM | POA: Diagnosis present

## 2021-09-17 DIAGNOSIS — Z9861 Coronary angioplasty status: Secondary | ICD-10-CM | POA: Diagnosis not present

## 2021-09-17 DIAGNOSIS — I110 Hypertensive heart disease with heart failure: Secondary | ICD-10-CM | POA: Diagnosis not present

## 2021-09-17 DIAGNOSIS — I2510 Atherosclerotic heart disease of native coronary artery without angina pectoris: Secondary | ICD-10-CM | POA: Diagnosis not present

## 2021-09-17 DIAGNOSIS — Z298 Encounter for other specified prophylactic measures: Secondary | ICD-10-CM | POA: Diagnosis not present

## 2021-09-17 DIAGNOSIS — I252 Old myocardial infarction: Secondary | ICD-10-CM

## 2021-09-17 DIAGNOSIS — I5041 Acute combined systolic (congestive) and diastolic (congestive) heart failure: Secondary | ICD-10-CM | POA: Diagnosis not present

## 2021-09-17 DIAGNOSIS — Z8249 Family history of ischemic heart disease and other diseases of the circulatory system: Secondary | ICD-10-CM

## 2021-09-17 DIAGNOSIS — I480 Paroxysmal atrial fibrillation: Principal | ICD-10-CM | POA: Diagnosis present

## 2021-09-17 DIAGNOSIS — G8191 Hemiplegia, unspecified affecting right dominant side: Secondary | ICD-10-CM | POA: Diagnosis present

## 2021-09-17 DIAGNOSIS — I5020 Unspecified systolic (congestive) heart failure: Secondary | ICD-10-CM | POA: Diagnosis not present

## 2021-09-17 DIAGNOSIS — I5021 Acute systolic (congestive) heart failure: Secondary | ICD-10-CM

## 2021-09-17 DIAGNOSIS — I361 Nonrheumatic tricuspid (valve) insufficiency: Secondary | ICD-10-CM | POA: Diagnosis not present

## 2021-09-17 DIAGNOSIS — Z87891 Personal history of nicotine dependence: Secondary | ICD-10-CM | POA: Diagnosis not present

## 2021-09-17 DIAGNOSIS — Z720 Tobacco use: Secondary | ICD-10-CM | POA: Diagnosis present

## 2021-09-17 DIAGNOSIS — R0602 Shortness of breath: Secondary | ICD-10-CM | POA: Diagnosis not present

## 2021-09-17 DIAGNOSIS — I10 Essential (primary) hypertension: Secondary | ICD-10-CM | POA: Diagnosis not present

## 2021-09-17 DIAGNOSIS — I714 Abdominal aortic aneurysm, without rupture, unspecified: Secondary | ICD-10-CM | POA: Diagnosis present

## 2021-09-17 DIAGNOSIS — I340 Nonrheumatic mitral (valve) insufficiency: Secondary | ICD-10-CM | POA: Diagnosis not present

## 2021-09-17 LAB — COMPREHENSIVE METABOLIC PANEL
ALT: 15 U/L (ref 0–44)
AST: 20 U/L (ref 15–41)
Albumin: 3.2 g/dL — ABNORMAL LOW (ref 3.5–5.0)
Alkaline Phosphatase: 45 U/L (ref 38–126)
Anion gap: 11 (ref 5–15)
BUN: 19 mg/dL (ref 8–23)
CO2: 25 mmol/L (ref 22–32)
Calcium: 8.6 mg/dL — ABNORMAL LOW (ref 8.9–10.3)
Chloride: 106 mmol/L (ref 98–111)
Creatinine, Ser: 1.51 mg/dL — ABNORMAL HIGH (ref 0.61–1.24)
GFR, Estimated: 48 mL/min — ABNORMAL LOW (ref >60)
Glucose, Bld: 110 mg/dL — ABNORMAL HIGH (ref 70–99)
Potassium: 3.4 mmol/L — ABNORMAL LOW (ref 3.5–5.1)
Sodium: 142 mmol/L (ref 135–145)
Total Bilirubin: 0.6 mg/dL (ref 0.3–1.2)
Total Protein: 6.7 g/dL (ref 6.5–8.1)

## 2021-09-17 LAB — BASIC METABOLIC PANEL
Anion gap: 9 (ref 5–15)
BUN: 18 mg/dL (ref 8–23)
CO2: 25 mmol/L (ref 22–32)
Calcium: 8.2 mg/dL — ABNORMAL LOW (ref 8.9–10.3)
Chloride: 105 mmol/L (ref 98–111)
Creatinine, Ser: 1.47 mg/dL — ABNORMAL HIGH (ref 0.61–1.24)
GFR, Estimated: 50 mL/min — ABNORMAL LOW (ref >60)
Glucose, Bld: 120 mg/dL — ABNORMAL HIGH (ref 70–99)
Potassium: 3.5 mmol/L (ref 3.5–5.1)
Sodium: 139 mmol/L (ref 135–145)

## 2021-09-17 LAB — MAGNESIUM
Magnesium: 1.8 mg/dL (ref 1.7–2.4)
Magnesium: 2.5 mg/dL — ABNORMAL HIGH (ref 1.7–2.4)

## 2021-09-17 LAB — TSH: TSH: 1.534 u[IU]/mL (ref 0.350–4.500)

## 2021-09-17 LAB — CBC WITH DIFFERENTIAL/PLATELET
Abs Immature Granulocytes: 0.09 10*3/uL — ABNORMAL HIGH (ref 0.00–0.07)
Abs Immature Granulocytes: 0.05 10*3/uL (ref 0.00–0.07)
Basophils Absolute: 0 10*3/uL (ref 0.0–0.1)
Basophils Absolute: 0 10*3/uL (ref 0.0–0.1)
Basophils Relative: 0 %
Basophils Relative: 0 %
Eosinophils Absolute: 0.1 10*3/uL (ref 0.0–0.5)
Eosinophils Absolute: 0 10*3/uL (ref 0.0–0.5)
Eosinophils Relative: 1 %
Eosinophils Relative: 1 %
HCT: 29.9 % — ABNORMAL LOW (ref 39.0–52.0)
HCT: 28 % — ABNORMAL LOW (ref 39.0–52.0)
Hemoglobin: 10.2 g/dL — ABNORMAL LOW (ref 13.0–17.0)
Hemoglobin: 9.5 g/dL — ABNORMAL LOW (ref 13.0–17.0)
Immature Granulocytes: 2 %
Immature Granulocytes: 1 %
Lymphocytes Relative: 44 %
Lymphocytes Relative: 34 %
Lymphs Abs: 2 10*3/uL (ref 0.7–4.0)
Lymphs Abs: 1.6 10*3/uL (ref 0.7–4.0)
MCH: 31.2 pg (ref 26.0–34.0)
MCH: 31.3 pg (ref 26.0–34.0)
MCHC: 34.1 g/dL (ref 30.0–36.0)
MCHC: 33.9 g/dL (ref 30.0–36.0)
MCV: 91.4 fL (ref 80.0–100.0)
MCV: 92.1 fL (ref 80.0–100.0)
Monocytes Absolute: 0.5 10*3/uL (ref 0.1–1.0)
Monocytes Absolute: 0.7 10*3/uL (ref 0.1–1.0)
Monocytes Relative: 12 %
Monocytes Relative: 16 %
Neutro Abs: 1.9 10*3/uL (ref 1.7–7.7)
Neutro Abs: 2.2 10*3/uL (ref 1.7–7.7)
Neutrophils Relative %: 41 %
Neutrophils Relative %: 48 %
Platelets: 137 10*3/uL — ABNORMAL LOW (ref 150–400)
Platelets: 123 10*3/uL — ABNORMAL LOW (ref 150–400)
RBC: 3.27 MIL/uL — ABNORMAL LOW (ref 4.22–5.81)
RBC: 3.04 MIL/uL — ABNORMAL LOW (ref 4.22–5.81)
RDW: 14.1 % (ref 11.5–15.5)
RDW: 13.9 % (ref 11.5–15.5)
WBC: 4.5 10*3/uL (ref 4.0–10.5)
WBC: 4.6 10*3/uL (ref 4.0–10.5)
nRBC: 0.4 % — ABNORMAL HIGH (ref 0.0–0.2)
nRBC: 0 % (ref 0.0–0.2)

## 2021-09-17 LAB — ECHOCARDIOGRAM COMPLETE
Area-P 1/2: 4.6 cm2
Height: 70 in
MV VTI: 2.2 cm2
S' Lateral: 3.7 cm
Weight: 2510.39 oz

## 2021-09-17 LAB — HEPARIN LEVEL (UNFRACTIONATED): Heparin Unfractionated: 0.89 IU/mL — ABNORMAL HIGH (ref 0.30–0.70)

## 2021-09-17 LAB — PROTIME-INR
INR: 1.2 (ref 0.8–1.2)
Prothrombin Time: 14.8 seconds (ref 11.4–15.2)

## 2021-09-17 LAB — APTT: aPTT: 25 seconds (ref 24–36)

## 2021-09-17 LAB — CORTISOL: Cortisol, Plasma: 19.1 ug/dL

## 2021-09-17 MED ORDER — PREDNISONE 10 MG PO TABS: 10.0000 mg | ORAL_TABLET | Freq: Every day | ORAL | 0 refills | Status: DC

## 2021-09-17 MED ORDER — POTASSIUM CHLORIDE IN NACL 20-0.9 MEQ/L-% IV SOLN
Freq: Once | INTRAVENOUS | Status: AC
  Filled 2021-09-17: qty 1000

## 2021-09-17 MED ORDER — ATORVASTATIN CALCIUM 40 MG PO TABS
80.0000 mg | ORAL_TABLET | Freq: Every day | ORAL | Status: DC
  Administered 2021-09-17 – 2021-09-21 (×4): 80 mg via ORAL
  Filled 2021-09-17 (×5): qty 2

## 2021-09-17 MED ORDER — OCTREOTIDE ACETATE 30 MG IM KIT
PACK | INTRAMUSCULAR | Status: AC
  Filled 2021-09-17: qty 1

## 2021-09-17 MED ORDER — SODIUM CHLORIDE 0.9% FLUSH
3.0000 mL | Freq: Two times a day (BID) | INTRAVENOUS | Status: DC
  Administered 2021-09-18 – 2021-09-21 (×7): 3 mL via INTRAVENOUS

## 2021-09-17 MED ORDER — SODIUM CHLORIDE 0.9 % IV SOLN: 250.0000 mL | INTRAVENOUS | Status: DC | PRN

## 2021-09-17 MED ORDER — PREDNISONE 10 MG PO TABS
10.0000 mg | ORAL_TABLET | Freq: Every day | ORAL | Status: DC
  Administered 2021-09-18 – 2021-09-21 (×3): 10 mg via ORAL
  Filled 2021-09-17 (×4): qty 1

## 2021-09-17 MED ORDER — DILTIAZEM LOAD VIA INFUSION
15.0000 mg | Freq: Once | INTRAVENOUS | Status: AC
  Administered 2021-09-17: 15 mg via INTRAVENOUS
  Filled 2021-09-17: qty 15

## 2021-09-17 MED ORDER — DILTIAZEM HCL-DEXTROSE 125-5 MG/125ML-% IV SOLN (PREMIX)
5.0000 mg/h | INTRAVENOUS | Status: DC
  Administered 2021-09-17: 5 mg/h via INTRAVENOUS
  Filled 2021-09-17: qty 125

## 2021-09-17 MED ORDER — ONDANSETRON HCL 4 MG/2ML IJ SOLN: 4.0000 mg | Freq: Four times a day (QID) | INTRAMUSCULAR | Status: DC | PRN

## 2021-09-17 MED ORDER — ACETAMINOPHEN 650 MG RE SUPP: 650.0000 mg | Freq: Four times a day (QID) | RECTAL | Status: DC | PRN

## 2021-09-17 MED ORDER — ACETAMINOPHEN 325 MG PO TABS: 650.0000 mg | ORAL_TABLET | Freq: Four times a day (QID) | ORAL | Status: DC | PRN

## 2021-09-17 MED ORDER — HEPARIN (PORCINE) 25000 UT/250ML-% IV SOLN
700.0000 [IU]/h | INTRAVENOUS | Status: DC
  Administered 2021-09-17: 1000 [IU]/h via INTRAVENOUS
  Filled 2021-09-17: qty 250

## 2021-09-17 MED ORDER — FOLIC ACID 1 MG PO TABS
1.0000 mg | ORAL_TABLET | Freq: Every day | ORAL | Status: DC
  Administered 2021-09-17 – 2021-09-21 (×4): 1 mg via ORAL
  Filled 2021-09-17 (×5): qty 1

## 2021-09-17 MED ORDER — HEPARIN BOLUS VIA INFUSION
4000.0000 [IU] | Freq: Once | INTRAVENOUS | Status: AC
  Administered 2021-09-17: 4000 [IU] via INTRAVENOUS

## 2021-09-17 MED ORDER — CYCLOBENZAPRINE HCL 10 MG PO TABS
10.0000 mg | ORAL_TABLET | Freq: Two times a day (BID) | ORAL | Status: DC | PRN
Start: 2021-09-17 — End: 2021-09-21

## 2021-09-17 MED ORDER — DILTIAZEM HCL 30 MG PO TABS
30.0000 mg | ORAL_TABLET | Freq: Three times a day (TID) | ORAL | Status: DC
Start: 2021-09-17 — End: 2021-09-17
  Administered 2021-09-17: 30 mg via ORAL
  Filled 2021-09-17: qty 1

## 2021-09-17 MED ORDER — ASPIRIN 81 MG PO TBEC
81.0000 mg | DELAYED_RELEASE_TABLET | Freq: Every day | ORAL | Status: DC
Start: 2021-09-17 — End: 2021-09-18
  Administered 2021-09-17 – 2021-09-18 (×2): 81 mg via ORAL
  Filled 2021-09-17 (×2): qty 1

## 2021-09-17 MED ORDER — SODIUM CHLORIDE 0.9% FLUSH: 3.0000 mL | INTRAVENOUS | Status: DC | PRN

## 2021-09-17 MED ORDER — MAGNESIUM SULFATE 2 GM/50ML IV SOLN
2.0000 g | Freq: Once | INTRAVENOUS | Status: AC
  Administered 2021-09-17: 2 g via INTRAVENOUS
  Filled 2021-09-17: qty 50

## 2021-09-17 MED ORDER — ONDANSETRON HCL 4 MG PO TABS: 4.0000 mg | ORAL_TABLET | Freq: Four times a day (QID) | ORAL | Status: DC | PRN

## 2021-09-17 MED ORDER — METOPROLOL SUCCINATE ER 50 MG PO TB24
50.0000 mg | ORAL_TABLET | Freq: Two times a day (BID) | ORAL | Status: DC
Start: 2021-09-17 — End: 2021-09-20
  Administered 2021-09-17 – 2021-09-19 (×5): 50 mg via ORAL
  Filled 2021-09-17 (×6): qty 1

## 2021-09-17 NOTE — Patient Instructions (Signed)
[facility]  Discharge Instructions: Thank you for choosing [facility] to provide your oncology and hematology care.  If you have a lab appointment with the [facility], please come in thru the Main Entrance and check in at the main information desk.  Wear comfortable clothing and clothing appropriate for easy access to any Portacath or PICC line.   We strive to give you quality time with your provider. You may need to reschedule your appointment if you arrive late (15 or more minutes).  Arriving late affects you and other patients whose appointments are after yours.  Also, if you miss three or more appointments without notifying the office, you may be dismissed from the clinic at the provider's discretion.      For prescription refill requests, have your pharmacy contact our office and allow 72 hours for refills to be completed.    Today you received the following EKG and fluids started before being transported to the emergency room, return as scheduled.   To help prevent nausea and vomiting after your treatment, we encourage you to take your nausea medication as directed.  BELOW ARE SYMPTOMS THAT SHOULD BE REPORTED IMMEDIATELY: *FEVER GREATER THAN 100.4 F (38 C) OR HIGHER *CHILLS OR SWEATING *NAUSEA AND VOMITING THAT IS NOT CONTROLLED WITH YOUR NAUSEA MEDICATION *UNUSUAL SHORTNESS OF BREATH *UNUSUAL BRUISING OR BLEEDING *URINARY PROBLEMS (pain or burning when urinating, or frequent urination) *BOWEL PROBLEMS (unusual diarrhea, constipation, pain near the anus) TENDERNESS IN MOUTH AND THROAT WITH OR WITHOUT PRESENCE OF ULCERS (sore throat, sores in mouth, or a toothache) UNUSUAL RASH, SWELLING OR PAIN  UNUSUAL VAGINAL DISCHARGE OR ITCHING   Items with * indicate a potential emergency and should be followed up as soon as possible or go to the Emergency Department if any problems should occur.  Please show the CHEMOTHERAPY ALERT CARD or IMMUNOTHERAPY  ALERT CARD at check-in to the Emergency Department and triage nurse.  Should you have questions after your visit or need to cancel or reschedule your appointment, please contact [facility] [phone]  and follow the prompts.  Office hours are 8:00 a.m. to 4:30 p.m. Monday - Friday. Please note that voicemails left after 4:00 p.m. may not be returned until the following business day.  We are closed weekends and major holidays. You have access to a nurse at all times for urgent questions. Please call the main number to the clinic [phone] and follow the prompts.  For any non-urgent questions, you may also contact your provider using MyChart. We now offer e-Visits for anyone 16 and older to request care online for non-urgent symptoms. For details visit [email or web address].   Also download the MyChart app! Go to the app store, search "MyChart", open the app, select [facility], and log in with your MyChart username and password.  Masks are optional in the cancer centers. If you would like for your care team to wear a mask while they are taking care of you, please let them know. You may have one support person who is at least 74 years old accompany you for your appointments.

## 2021-09-17 NOTE — ED Provider Notes (Signed)
[facility] EMERGENCY DEPARTMENT Provider Note   CSN: [id number] Arrival date & time: 09/17/21  1133     History Chief Complaint  Patient presents with   Atrial Fibrillation    Darryl Ward is a 76 y.o. male patient with history of stage II lung cancer on chemotherapy and immunotherapy last received dose 3 weeks ago who presents to the emergency department for further evaluation of atrial fibrillation.  Patient was at the cancer center to receive chemotherapy and immunotherapy but was sent to the emergency department secondary to new onset atrial fibrillation with RVR.  Patient endorses some increased shortness of breath more than normal over the last week in addition to some dizziness.  He denies chest pain, palpitations, abdominal pain, nausea, vomiting, diarrhea, fever, chills, cough, congestion.   Atrial Fibrillation       Home Medications Prior to Admission medications   Medication Sig Start Date End Date Taking? Authorizing Provider  aspirin EC 81 MG tablet Take 1 tablet (81 mg total) by mouth daily. Okay to restart this medication on 06/12/2021 06/11/21   [name], MD  atorvastatin (LIPITOR) 80 MG tablet Take 80 mg by mouth daily.    [provider]  CARBOPLATIN IV Inject into the vein every 21 ( twenty-one) days. 08/06/21   [provider]  cyclobenzaprine (FLEXERIL) 10 MG tablet  07/09/21   [provider]  doxycycline (VIBRA-TABS) 100 MG tablet Take by mouth 2 (two) times daily. 08/16/21   [provider]  folic acid (FOLVITE) 1 MG tablet Take 1 tablet (1 mg total) by mouth daily. 07/26/21   Derek Jack, MD  furosemide (LASIX) 20 MG tablet Take 1 tablet (20 mg total) by mouth daily as needed. 07/26/21   Derek Jack, MD  lidocaine-prilocaine (EMLA) cream Apply a small amount to port a cath site and cover with plastic wrap 1 hour prior to infusion appointments 08/02/21   Derek Jack, MD  metoprolol succinate  (TOPROL-XL) 25 MG 24 hr tablet Take 1 tablet (25 mg total) by mouth daily. 02/16/20   Ivy Lynn, NP  Nivolumab (OPDIVO IV) Inject into the vein.    [provider]  PEMETREXED IV Inject into the vein every 21 ( twenty-one) days. 08/06/21   [provider]  predniSONE (DELTASONE) 10 MG tablet Take 1 tablet (10 mg total) by mouth daily with breakfast. 09/17/21   Derek Jack, MD  prochlorperazine (COMPAZINE) 10 MG tablet Take 1 tablet (10 mg total) by mouth every 6 (six) hours as needed (Nausea or vomiting). 08/02/21   Derek Jack, MD  triamcinolone cream (KENALOG) 0.1 % Apply topically 2 (two) times daily. 08/16/21   [provider]      Allergies    Patient has no known allergies.    Review of Systems   Review of Systems  All other systems reviewed and are negative.   Physical Exam Updated Vital Signs BP 97/72   Pulse 62   Temp 97.7 F (36.5 C) (Oral)   Resp 12   Ht 5\' 10"  (1.778 m)   Wt 71.2 kg   SpO2 97%   BMI 22.51 kg/m  Physical Exam Vitals and nursing note reviewed.  Constitutional:      General: He is not in acute distress.    Appearance: Normal appearance.  HENT:     Head: Normocephalic and atraumatic.  Eyes:     General:        Right eye: No discharge.  Left eye: No discharge.  Cardiovascular:     Rate and Rhythm: Tachycardia present. Rhythm irregularly irregular.     Comments: Radial pulses are 2+ bilaterally.  Dorsalis pedis pulses are 2+ bilaterally.  No evidence of pedal edema. Pulmonary:     Comments: Clear to auscultation bilaterally.  Normal effort.  No respiratory distress.  No evidence of wheezes, rales, or rhonchi heard throughout. Abdominal:     General: Abdomen is flat. Bowel sounds are normal. There is no distension.     Tenderness: There is no abdominal tenderness. There is no guarding or rebound.  Musculoskeletal:        General: Normal range of motion.     Cervical back: Neck supple.  Skin:     General: Skin is warm and dry.     Findings: No rash.  Neurological:     General: No focal deficit present.     Mental Status: He is alert.  Psychiatric:        Mood and Affect: Mood normal.        Behavior: Behavior normal.     ED Results / Procedures / Treatments   Labs (all labs ordered are listed, but only abnormal results are displayed) Labs Reviewed  CBC WITH DIFFERENTIAL/PLATELET - Abnormal; Notable for the following components:      Result Value   RBC 3.04 (*)    Hemoglobin 9.5 (*)    HCT 28.0 (*)    Platelets 123 (*)    All other components within normal limits  BASIC METABOLIC PANEL - Abnormal; Notable for the following components:   Glucose, Bld 120 (*)    Creatinine, Ser 1.47 (*)    Calcium 8.2 (*)    GFR, Estimated 50 (*)    All other components within normal limits  MAGNESIUM - Abnormal; Notable for the following components:   Magnesium 2.5 (*)    All other components within normal limits  PROTIME-INR  APTT  HEPARIN LEVEL (UNFRACTIONATED)    EKG EKG Interpretation  Date/Time:  Monday September 17 2021 11:48:42 EDT Ventricular Rate:  130 PR Interval:    QRS Duration: 99 QT Interval:  349 QTC Calculation: 514 R Axis:   73 Text Interpretation: Atrial fibrillation Prolonged QT interval Since last tracing Atrial fibrillation now present with RVR Confirmed by Noemi Chapel [phone]) on 09/17/2021 12:29:46 PM  Radiology DG Chest 2 View  Result Date: 09/17/2021 CLINICAL DATA:  Shortness of breath and dizziness. EXAM: CHEST - 2 VIEW COMPARISON:  Chest radiograph 06/11/2021 FINDINGS: A left chest wall port is in place with tip terminating at the cavoatrial junction. The cardiomediastinal silhouette is stable. There is no focal consolidation or pulmonary edema. There is no pleural effusion or pneumothorax. A fiducial marker is again seen in the right upper lobe with decreased surrounding density likely reflecting resolved post biopsy hemorrhage. There is no acute  osseous abnormality. IMPRESSION: 1. Fiducial marker in place in the right upper lobe with decreased surrounding density likely reflecting resolved post biopsy hemorrhage. 2. No radiographic evidence of acute cardiopulmonary process. Electronically Signed   By: Valetta Mole M.D.   On: 09/17/2021 12:35    Procedures .Critical Care  Performed by: Hendricks Limes, PA-C Authorized by: Hendricks Limes, PA-C   Critical care provider statement:    Critical care time (minutes):  35   Critical care time was exclusive of:  Separately billable procedures and treating other patients   Critical care was necessary to treat or prevent imminent or  life-threatening deterioration of the following conditions:  Cardiac failure and circulatory failure   Critical care was time spent personally by me on the following activities:  Ordering and performing treatments and interventions, pulse oximetry, review of old charts, re-evaluation of patient's condition, ordering and review of radiographic studies and ordering and review of laboratory studies     Medications Ordered in ED Medications  diltiazem (CARDIZEM) 1 mg/mL load via infusion 15 mg (15 mg Intravenous Bolus from Bag 09/17/21 1241)    And  diltiazem (CARDIZEM) 125 mg in dextrose 5% 125 mL (1 mg/mL) infusion (5 mg/hr Intravenous New Bag/Given 09/17/21 1241)  heparin bolus via infusion 4,000 Units (has no administration in time range)  heparin ADULT infusion 100 units/mL (25000 units/2105mL) (has no administration in time range)    ED Course/ Medical Decision Making/ A&P Clinical Course as of 09/17/21 1424  Mon Sep 17, 2021  1342 Chadsvasc score of 5. Will start on heparin. [CF]  3419 On reevaluation, patient is resting comfortably in the emergency department.  Heart rate is controlled with diltiazem drip.  I went over all labs and imaging with the patient and significant other at the bedside.  All questions and concerns addressed. [CF]  1419 CBC with  Differential(!) No evidence of leukocytosis.  There is evidence of anemia which seems to be downtrending in comparison to previous results.  Does not meet transfusion criteria at this time. [CF]  1419 Magnesium(!) Slightly above normal. [CF]  3790 Basic metabolic panel(!) No electrolyte abnormalities.  Slightly elevated creatinine. [CF]  1420 Coagulation studies are normal. [CF]  1420 DG Chest 2 View I personally ordered and interpreted this study denies any evidence of pneumonia or pleural effusions.  I do agree with radiologist interpretation. [CF]  1423 I spoke with Dr. Manuella Ghazi with [facility] hospitalist who agrees to admit the patient. [CF]    Clinical Course User Index [CF] Hendricks Limes, PA-C                           Medical Decision Making JENNA ARDOIN is a 76 y.o. male patient who presents to the emergency room today for further evaluation of new onset atrial fibrillation with rapid ventricular rate.  We will get some labs and give him diltiazem drip.  He does not meet criteria for cardioversion at this time.  Overall, patient is in no acute distress.  We will look for inciting event.  I will plan to reassess frequently.  As highlighted in ED course, I do not see any evidence of inciting event.  Patient's rate is controlled with diltiazem.  Considering his risk factors I do feel the patient would likely benefit from further evaluation in the hospital.  I will start him on heparin drip as his CHA2DS2-VASc score is high.  I will work on getting admitted to the hospitalist service.   Amount and/or Complexity of Data Reviewed External Data Reviewed: labs and notes.    Details: Highlighted in HPI. Labs: ordered. Radiology: ordered.  Risk Prescription drug management. Decision regarding hospitalization.   Final Clinical Impression(s) / ED Diagnoses Final diagnoses:  Atrial fibrillation with RVR ([place])  Generalized weakness    Rx / DC Orders ED Discharge Orders     None          Hendricks Limes, [place] 09/17/21 1424    Noemi Chapel, MD 09/18/21 1601

## 2021-09-17 NOTE — Progress Notes (Signed)
ANTICOAGULATION CONSULT NOTE  Pharmacy Consult for heparin Indication: atrial fibrillation Brief A/P: Heparin level supratherapeutic Decrease Heparin rate  No Known Allergies  Patient Measurements: Height: 5\' 10"  (177.8 cm) Weight: 71.2 kg (156 lb 14.4 oz) IBW/kg (Calculated) : 73 Heparin Dosing Weight: 71.2 kg  Vital Signs: Temp: 97.8 F (36.6 C) (08/28 2120) Temp Source: Axillary (08/28 2120) BP: 112/76 (08/28 2120) Pulse Rate: 112 (08/28 2120)  Labs: Recent Labs    09/17/21 0917 09/17/21 1150 09/17/21 1158 09/17/21 2201  HGB 10.2* 9.5*  --   --   HCT 29.9* 28.0*  --   --   PLT 137* 123*  --   --   APTT  --   --  25  --   LABPROT  --   --  14.8  --   INR  --   --  1.2  --   HEPARINUNFRC  --   --   --  0.89*  CREATININE 1.51* 1.47*  --   --      Estimated Creatinine Clearance: 44.4 mL/min (A) (by C-G formula based on SCr of 1.47 mg/dL (H)).  Assessment: 76 y.o. male with Afib for heparin  Goal of Therapy:  Heparin level 0.3-0.7 units/ml Monitor platelets by anticoagulation protocol: Yes   Plan:  Decrease Heparin 850 units/hr Follow-up am labs.  Phillis Knack, PharmD, BCPS   09/17/2021 11:36 PM

## 2021-09-17 NOTE — Progress Notes (Signed)
   09/17/21 2120  Vitals  Temp 97.8 F (36.6 C)  Temp Source Axillary  BP 112/76  BP Location Left Arm  BP Method Manual  Patient Position (if appropriate) Lying  Pulse Rate (!) 112  ECG Heart Rate (!) 112 (afib)  MEWS COLOR  MEWS Score Color Yellow  MEWS Score  MEWS Temp 0  MEWS Systolic 0  MEWS Pulse 2  MEWS RR 0  MEWS LOC 0  MEWS Score 2  Provider Notification  Provider Name/Title Charge RN  Date Provider Notified 09/17/21  Time Provider Notified 2220  Method of Notification Face-to-face  Notification Reason Change in status  Provider response Other (Comment) (continue to monitor)  Date of Provider Response 09/17/21  Time of Provider Response 2220

## 2021-09-17 NOTE — Progress Notes (Signed)
Patient presents today for possible treatment. Due to weakness and additional symptoms no treatment today per Dr. Delton Coombes. Additional orders received for house fluids and EKG. EKG performed. Per Dr. Delton Coombes d/t new onset of A-fib, patient will need to go to [facility] Emergency room. Report called by Anastasio Champion RN. Patient transported to ED via wheelchair.

## 2021-09-17 NOTE — Consult Note (Addendum)
Cardiology Consultation   Patient ID: Darryl Ward MRN: [id number]; DOB: 11/17/1945  Admit date: 09/17/2021 Date of Consult: 09/17/2021  PCP:  Darryl Sill, NP   [place] Providers Cardiologist:  None   {   Patient Profile:   [name] is a 76 y.o. male with a hx of CAD, PVD, CVA, HTN, dyslipidemia, Stage 2 lung adenocarcinoma who is being seen 09/17/2021 for the evaluation of new onset Afib at the request of Dr. Manuella Ward.  History of Present Illness:   Darryl Ward had a stress perfusion study prior to aortobifem in 2013. This showed no evidence of ischemia, overall low risk. Patient reports remote history of a heart attack.   He was admitted Janaury 2022 with suspected stroke. Echo showed normal LVEF and no source of embolism. He was started on ASA and statin.    Last seen 03/2020 for pre-op for back surgery, no further testing was pursued.  The patient presented to the ER 09/17/21 for elevated heart rate. He says over the last 3-4 weeks he has noted DOE and fatigue, unable to do normal activities and was taking more breaks. No chest pain, LLE, orthopnea, or pnd. He also started chemotherapy recently, which he attributed his symptoms to. When he went to therapy this AM, it was noted his heart rate was elevated and he was sent to the ER.  In the ER EKG showed Afib with heart rate up to 130s. Hgb 10.2, WBC 4.5, plt 137, potassium 3.4, Scr 1.47, BUN 19, albumin 3.2. CXR nonacute. He was started on IV cardizem and IV heparin and admitted.   Past Medical History:  Diagnosis Date   AAA (abdominal aortic aneurysm) (HCC)    Arthritis    Carotid artery disease ([place])    Nonobstructive   Cataract    Coronary atherosclerosis of native coronary artery    PTCA small diagonal 2007 otherwise nonobstructive CAD, EF 50-55%   Essential hypertension, benign    Hyperlipidemia    NSTEMI (non-ST elevated myocardial infarction) ([place]) 2007   Stroke [facility]) 2004   TIA (transient  ischemic attack) 2006    Past Surgical History:  Procedure Laterality Date   AORTA - BILATERAL FEMORAL ARTERY BYPASS GRAFT  01/08/2012   Procedure: AORTA BIFEMORAL BYPASS GRAFT;  Surgeon: Darryl Dutch, MD;  Location: [facility] OR;  Service: Vascular;  Laterality: Bilateral;  using 18x48mm x 40cm Hemashield Gold Vascular Graft with Endarterectomy, Thombectomy and  Reimplantation of Inferior Mesenteric Artery   BRONCHIAL BIOPSY  06/11/2021   Procedure: BRONCHIAL BIOPSIES;  Surgeon: Darryl Gobble, MD;  Location: [facility] ENDOSCOPY;  Service: Pulmonary;;   BRONCHIAL BRUSHINGS  06/11/2021   Procedure: BRONCHIAL BRUSHINGS;  Surgeon: Darryl Gobble, MD;  Location: [facility] ENDOSCOPY;  Service: Pulmonary;;   BRONCHIAL NEEDLE ASPIRATION BIOPSY  06/11/2021   Procedure: BRONCHIAL NEEDLE ASPIRATION BIOPSIES;  Surgeon: Darryl Gobble, MD;  Location: [facility] ENDOSCOPY;  Service: Pulmonary;;   COLONOSCOPY N/A 04/14/2019   Procedure: COLONOSCOPY;  Surgeon: Darryl Dolin, MD;  Location: [facility] ENDO SUITE;  Service: Endoscopy;  Laterality: N/A;  9:30   COLONOSCOPY WITH PROPOFOL N/A 07/25/2021   Procedure: COLONOSCOPY WITH PROPOFOL;  Surgeon: Darryl Harman, DO;  Location: [facility] ENDO SUITE;  Service: Endoscopy;  Laterality: N/A;  1:00pm   FIDUCIAL MARKER PLACEMENT  06/11/2021   Procedure: FIDUCIAL MARKER PLACEMENT;  Surgeon: Darryl Gobble, MD;  Location: [facility] ENDOSCOPY;  Service: Pulmonary;;   IR IMAGING GUIDED PORT INSERTION  07/31/2021  Left cataract surgery     POLYPECTOMY  04/14/2019   Procedure: POLYPECTOMY;  Surgeon: Darryl Dolin, MD;  Location: [facility] ENDO SUITE;  Service: Endoscopy;;   POLYPECTOMY  07/25/2021   Procedure: POLYPECTOMY;  Surgeon: Darryl Harman, DO;  Location: [facility] ENDO SUITE;  Service: Endoscopy;;   TRANSFORAMINAL LUMBAR INTERBODY FUSION (TLIF) WITH PEDICLE SCREW FIXATION 1 LEVEL N/A 04/27/2020   Procedure: Transforaminal Lumbar Interbody Fusion Lumbar Five-Sacral One;  Surgeon: Darryl Mare, MD;  Location: [place];  Service: Neurosurgery;  Laterality: N/A;   VIDEO BRONCHOSCOPY WITH RADIAL ENDOBRONCHIAL ULTRASOUND  06/11/2021   Procedure: VIDEO BRONCHOSCOPY WITH RADIAL ENDOBRONCHIAL ULTRASOUND;  Surgeon: Darryl Gobble, MD;  Location: [place] ENDOSCOPY;  Service: Pulmonary;;     Home Medications:  Prior to Admission medications   Medication Sig Start Date End Date Taking? Authorizing Provider  aspirin EC 81 MG tablet Take 1 tablet (81 mg total) by mouth daily. Okay to restart this medication on 06/12/2021 Patient taking differently: Take 81 mg by mouth daily. 06/11/21  Yes Darryl Gobble, MD  atorvastatin (LIPITOR) 80 MG tablet Take 80 mg by mouth daily.   Yes [provider]  CARBOPLATIN IV Inject into the vein every 21 ( twenty-one) days. 08/06/21  Yes [provider]  cyclobenzaprine (FLEXERIL) 10 MG tablet Take 10 mg by mouth 2 (two) times daily as needed for muscle spasms. 07/09/21  Yes [provider]  folic acid (FOLVITE) 1 MG tablet Take 1 tablet (1 mg total) by mouth daily. 07/26/21  Yes Darryl Jack, MD  furosemide (LASIX) 20 MG tablet Take 1 tablet (20 mg total) by mouth daily as needed. Patient taking differently: Take 20 mg by mouth every other day. 07/26/21  Yes Darryl Jack, MD  lidocaine-prilocaine (EMLA) cream Apply a small amount to port a cath site and cover with plastic wrap 1 hour prior to infusion appointments 08/02/21  Yes Darryl Jack, MD  Nivolumab (OPDIVO IV) Inject into the vein.   Yes [provider]  PEMETREXED IV Inject into the vein every 21 ( twenty-one) days. 08/06/21  Yes [provider]  predniSONE (DELTASONE) 10 MG tablet Take 1 tablet (10 mg total) by mouth daily with breakfast. 09/17/21  Yes Darryl Jack, MD  prochlorperazine (COMPAZINE) 10 MG tablet Take 1 tablet (10 mg total) by mouth every 6 (six) hours as needed (Nausea or vomiting). 08/02/21  Yes Darryl Jack, MD  metoprolol succinate  (TOPROL-XL) 25 MG 24 hr tablet Take 1 tablet (25 mg total) by mouth daily. Patient not taking: Reported on 09/17/2021 02/16/20   Darryl Lynn, NP    Inpatient Medications: Scheduled Meds:  aspirin EC  81 mg Oral Daily   atorvastatin  80 mg Oral Daily   folic acid  1 mg Oral Daily   [START ON 09/18/2021] predniSONE  10 mg Oral Q breakfast   sodium chloride flush  3 mL Intravenous Q12H   Continuous Infusions:  sodium chloride     diltiazem (CARDIZEM) infusion 5 mg/hr (09/17/21 1241)   heparin 1,000 Units/hr (09/17/21 1432)   PRN Meds: sodium chloride, acetaminophen **OR** acetaminophen, cyclobenzaprine, ondansetron **OR** ondansetron (ZOFRAN) IV, sodium chloride flush  Allergies:   No Known Allergies  Social History:   Social History   Socioeconomic History   Marital status: Divorced    Spouse name: Not on file   Number of children: 1   Years of education: 11   Highest education level: 11th grade  Occupational History  Employer: Probation officer  Tobacco Use   Smoking status: Every Day    Packs/day: 1.00    Years: 40.00    Total pack years: 40.00    Types: Cigarettes   Smokeless tobacco: Never   Tobacco comments:    1 pack of cigarettes smoked daily. 07/17/21 ARJ, RN   Vaping Use   Vaping Use: Never used  Substance and Sexual Activity   Alcohol use: No    Comment: Prior history of regular alcohol use   Drug use: No   Sexual activity: Not Currently  Other Topics Concern   Not on file  Social History Narrative   Not on file   Social Determinants of Health   Financial Resource Strain: Low Risk  (11/29/2019)   Overall Financial Resource Strain (CARDIA)    Difficulty of Paying Living Expenses: Not hard at all  Food Insecurity: No Food Insecurity (11/29/2019)   Hunger Vital Sign    Worried About Running Out of Food in the Last Year: Never true    Ran Out of Food in the Last Year: Never true  Transportation Needs: No Transportation Needs (11/29/2019)   PRAPARE -  Hydrologist (Medical): No    Lack of Transportation (Non-Medical): No  Physical Activity: Inactive (11/29/2019)   Exercise Vital Sign    Days of Exercise per Week: 0 days    Minutes of Exercise per Session: 0 min  Stress: No Stress Concern Present (11/29/2019)   [place]    Feeling of Stress : Only a little  Social Connections: Moderately Isolated (11/29/2019)   Social Connection and Isolation Panel [NHANES]    Frequency of Communication with Friends and Family: More than three times a week    Frequency of Social Gatherings with Friends and Family: More than three times a week    Attends Religious Services: 1 to 4 times per year    Active Member of [employer] or Organizations: No    Attends Archivist Meetings: Never    Marital Status: Divorced  Human resources officer Violence: Not At Risk (11/20/2018)   Humiliation, Afraid, Rape, and Kick questionnaire    Fear of Current or Ex-Partner: No    Emotionally Abused: No    Physically Abused: No    Sexually Abused: No    Family History:    Family History  Problem Relation Age of Onset   Hyperlipidemia Sister    Heart attack Brother 81   Cancer - Colon Neg Hx      ROS:  Please see the history of present illness.   All other ROS reviewed and negative.     Physical Exam/Data:   Vitals:   09/17/21 1430 09/17/21 1454 09/17/21 1504 09/17/21 1600  BP: 114/75  101/76 103/63  Pulse: (!) 133 62 77 (!) 58  Resp: 16 18 17 20   Temp:   97.8 F (36.6 C)   TempSrc:   Oral   SpO2: 99% 100% 100% 100%  Weight:      Height:       No intake or output data in the 24 hours ending 09/17/21 1606    09/17/2021   11:46 AM 09/17/2021    9:01 AM 08/27/2021    9:53 AM  Last 3 Weights  Weight (lbs) 156 lb 14.4 oz 156 lb 14.4 oz 164 lb 6.4 oz  Weight (kg) 71.169 kg 71.169 kg 74.571 kg     Body mass index is  22.51 kg/m.  General:  Well nourished, well  developed, in no acute distress HEENT: normal Neck: no JVD Vascular: No carotid bruits; Distal pulses 2+ bilaterally Cardiac:  normal S1, S2; Irreg Irreg; no murmur  Lungs:  course breath sounds  Abd: soft, nontender, no hepatomegaly  Ext: no edema Musculoskeletal:  No deformities, BUE and BLE strength normal and equal Skin: warm and dry  Neuro:  CNs 2-12 intact, no focal abnormalities noted Psych:  Normal affect   EKG:  The EKG was personally reviewed and demonstrates:  Afib RVR 132bpm Telemetry:  Telemetry was personally reviewed and demonstrates:  Afib HR 140>70s, PVCs  Relevant CV Studies:  Echo 2022  1. Left ventricular ejection fraction, by estimation, is 60 to 65%. The  left ventricle has normal function. The left ventricle has no regional  wall motion abnormalities. Left ventricular diastolic parameters were  normal.   2. Right ventricular systolic function is normal. The right ventricular  size is normal.   3. Left atrial size was moderately dilated.   4. The mitral valve is normal in structure. No evidence of mitral valve  regurgitation. No evidence of mitral stenosis.   5. The aortic valve is tricuspid. Aortic valve regurgitation is not  visualized. No aortic stenosis is present.   6. The inferior vena cava is normal in size with greater than 50%  respiratory variability, suggesting right atrial pressure of 3 mmHg.   Carotid US 2022 IMPRESSION: 1. Moderate to large amount of left-sided atherosclerotic plaque results in elevated peak systolic velocities within the left internal carotid artery compatible with the higher end of the 50-69% luminal narrowing range, compatible with the findings seen on preceding neck CTA. 2. Moderate amount of right-sided atherosclerotic plaque, not resulting in a hemodynamically significant stenosis. 3. Patent dominant right vertebral artery. 4. Nonvisualization of the left vertebral artery however the left vertebral artery is noted  to be reconstituted at the level of the superior aspect of the cervical spine on preceding CTA.  Laboratory Data:  High Sensitivity Troponin:  No results for input(s): "TROPONINIHS" in the last 720 hours.   Chemistry Recent Labs  Lab 09/17/21 0917 09/17/21 1150  NA 142 139  K 3.4* 3.5  CL 106 105  CO2 25 25  GLUCOSE 110* 120*  BUN 19 18  CREATININE 1.51* 1.47*  CALCIUM 8.6* 8.2*  MG 1.8 2.5*  GFRNONAA 48* 50*  ANIONGAP 11 9    Recent Labs  Lab 09/17/21 0917  PROT 6.7  ALBUMIN 3.2*  AST 20  ALT 15  ALKPHOS 45  BILITOT 0.6   Lipids No results for input(s): "CHOL", "TRIG", "HDL", "LABVLDL", "LDLCALC", "CHOLHDL" in the last 168 hours.  Hematology Recent Labs  Lab 09/17/21 0917 09/17/21 1150  WBC 4.5 4.6  RBC 3.27* 3.04*  HGB 10.2* 9.5*  HCT 29.9* 28.0*  MCV 91.4 92.1  MCH 31.2 31.3  MCHC 34.1 33.9  RDW 14.1 13.9  PLT 137* 123*   Thyroid No results for input(s): "TSH", "FREET4" in the last 168 hours.  BNPNo results for input(s): "BNP", "PROBNP" in the last 168 hours.  DDimer No results for input(s): "DDIMER" in the last 168 hours.   Radiology/Studies:  DG Chest 2 View  Result Date: 09/17/2021 CLINICAL DATA:  Shortness of breath and dizziness. EXAM: CHEST - 2 VIEW COMPARISON:  Chest radiograph 06/11/2021 FINDINGS: A left chest wall port is in place with tip terminating at the cavoatrial junction. The cardiomediastinal silhouette is stable. There is no focal  consolidation or pulmonary edema. There is no pleural effusion or pneumothorax. A fiducial marker is again seen in the right upper lobe with decreased surrounding density likely reflecting resolved post biopsy hemorrhage. There is no acute osseous abnormality. IMPRESSION: 1. Fiducial marker in place in the right upper lobe with decreased surrounding density likely reflecting resolved post biopsy hemorrhage. 2. No radiographic evidence of acute cardiopulmonary process. Electronically Signed   By: Valetta Mole M.D.    On: 09/17/2021 12:35     Assessment and Plan:   New onset Afib - presented with elevated HR found to be in new onset Afib with heart rates up to 140s - started on IV cardizem with improvement of HR - CHADSVASC agex1, stroke x 2, PVD, HTN, started on IV heparin. He will qualify for long-term a/c with Eliquis. Given chronic anemia and low plt appreciate oncology inpuot. - Labs showed mild hypokalemia K3.4, goal>4. Keep Mag >2 - TSH pending - Still in afib with controlled HR - Cannot titrate dilt with soft pressures - echo ordered>unofficial report shows reduced EF 35-40%, stop dilt and restart toprol - may need TEE/DCCV if he doesn't self-convert  Cardiomyopathy - unofficial report with LVEF 35-40% - prior echo 2022 showed normal LVEF -  low EF in the setting of rapid afib - stop dilt and start Toprol 50mg  BID - plan for restoration of NSR followed by GDTM as able, and then we can re-check an echo in 2-3 months.   AKI - Scr 1.51, BUN 19 - daily BMET - PTA lasix 20mg  held  Hypokalemia - K3.4, goal>4, supplement as needed  CAD with prior reported MI - PTA Aspirin, statin and BB therapy - No chest pain reported  H/o stroke - PTA ASA and statin  Chronic anemia - Hgb stable  - monitor with IV heparin  HTN - PTA Toprol 25mg  daily held - IV dilt  Stage 2 lung cancer - recently started chemotherapy  Tobacco use - he quit smoking about 5 weeks ago   For questions or updates, please contact [place] Please consult [email or web address] for contact info under    Signed, Cadence Ninfa Meeker, PA-C  09/17/2021 4:06 PM   Personally seen and examined. Agree with above.  76 year old with stage II lung adenocarcinoma recent chemotherapy with prior CAD PVD stroke history with aortobifem in 2013 here with new onset atrial fibrillation.  He was seen today in the outpatient setting at his oncologist and was found to be in atrial fibrillation.  He also had low blood pressure  and there was contemplation of discontinuing his metoprolol.  His heart rate was in the 130s originally.  EKG personally reviewed.  Atrial fibrillation.  He has felt weak, winded, poor.  He attributed this to his chemotherapy.  His echocardiogram personally reviewed and interpreted today demonstrates an EF of 35 to 40% which is new for him.  Previously it was 65%.  Platelet count 123, hemoglobin 9.5  On exam-irregularly irregular rhythm, no significant murmurs, decreased pedal pulses, lungs with normal respiratory effort.  Telemetry now shows A-fib in the 70s.  On diltiazem IV.  Assessment and plan:  Atrial fibrillation with rapid ventricular response - We will stop his IV diltiazem since his ejection fraction is 35%. - I will give him metoprolol tartrate 50 mg twice a day for rate control. - We need to watch this closely because he was hypotensive at times in fact in clinic they were contemplating pulling off his metoprolol. - If hypotension  becomes a major issue, we will discontinue the metoprolol and start amiodarone IV. 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  Chronic anticoagulation - Discussed with him the importance of anticoagulation in the setting of atrial fibrillation to help prevent strokes.  IV heparin was started by his main team.  I would like for him to be on Eliquis if okay by oncology.  He does have platelet count of 123,000 currently and his hemoglobin is in the 9 range.  If his chemotherapy decreases his platelets or hemoglobin more, this may cause issues with anticoagulation chronically.  I will defer to primary team/oncology team for assistance.  CAD/PAD - Prior aortobifemoral bypass several years ago.  No anginal symptoms.  Acute systolic heart failure - Shortness of breath which she attributed to  chemotherapy may actually be secondary to decreased cardiac pump function.  His ejection fraction currently is 35 to 40%.  As a stated above, stopping IV diltiazem and trying to utilize metoprolol for rate control.  If this is unsuccessful, may need to utilize amiodarone. 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  Candee Furbish, MD

## 2021-09-17 NOTE — ED Notes (Signed)
Patient transported to X-ray 

## 2021-09-17 NOTE — Progress Notes (Signed)
Pt to be sent to the ED per Dr. Delton Coombes d/t new onset a-fib. Report called to Amy, ED Charge RN.

## 2021-09-17 NOTE — Progress Notes (Signed)
*  PRELIMINARY RESULTS* Echocardiogram 2D Echocardiogram has been performed.  Darryl Ward 09/17/2021, 4:11 PM

## 2021-09-17 NOTE — Progress Notes (Signed)
ANTICOAGULATION CONSULT NOTE - Initial Consult  Pharmacy Consult for heparin Indication: atrial fibrillation  No Known Allergies  Patient Measurements: Height: 5\' 10"  (177.8 cm) Weight: 71.2 kg (156 lb 14.4 oz) IBW/kg (Calculated) : 73 Heparin Dosing Weight: 71.2 kg  Vital Signs: Temp: 97.7 F (36.5 C) (08/28 1144) Temp Source: Oral (08/28 1144) BP: 97/72 (08/28 1330) Pulse Rate: 62 (08/28 1330)  Labs: Recent Labs    09/17/21 0917 09/17/21 1150 09/17/21 1158  HGB 10.2* 9.5*  --   HCT 29.9* 28.0*  --   PLT 137* 123*  --   APTT  --   --  25  LABPROT  --   --  14.8  INR  --   --  1.2  CREATININE 1.51*  --   --     Estimated Creatinine Clearance: 43.2 mL/min (A) (by C-G formula based on SCr of 1.51 mg/dL (H)).   Medical History: Past Medical History:  Diagnosis Date   AAA (abdominal aortic aneurysm) ([place])    Arthritis    Carotid artery disease ([place])    Nonobstructive   Cataract    Coronary atherosclerosis of native coronary artery    PTCA small diagonal 2007 otherwise nonobstructive CAD, EF 50-55%   Essential hypertension, benign    Hyperlipidemia    NSTEMI (non-ST elevated myocardial infarction) ([place]) 2007   Stroke [facility]) 2004   TIA (transient ischemic attack) 2006    Medications:  (Not in a hospital admission)   Assessment: Pharmacy consulted to dose heparin in patient with atrial fibrillation.  Patient is not on anticoagulation prior to admission.    Hgb 9.5  Goal of Therapy:  Heparin level 0.3-0.7 units/ml Monitor platelets by anticoagulation protocol: Yes   Plan:  Give 4000 units bolus x 1 Start heparin infusion at 1000 units/hr Check anti-Xa level in 8 hours and daily while on heparin Continue to monitor H&H and platelets  Margot Ables, PharmD Clinical Pharmacist 09/17/2021 2:02 PM

## 2021-09-17 NOTE — ED Notes (Addendum)
DO, Manuella Ghazi, made aware of pt pulse of 58 and ECG HR of 83. Verbal order by Heath Lark to discontinue the cardizem drip.

## 2021-09-17 NOTE — Patient Instructions (Addendum)
[facility] Discharge Instructions   You were seen and examined today by Dr. Delton Coombes.  He reviewed the results of your lab work. All results are stable except your kidney function is elevated. We will hold treatment today and give you IV hydration to correct this.   We will hold treatment today and obtain additional blood work.  STOP taking Toprol XL. Start taking Lasix every other day.   Return as scheduled in 1 week.    Thank you for choosing [place] [facility] to provide your oncology and hematology care.  To afford each patient quality time with our provider, please arrive at least 15 minutes before your scheduled appointment time.   If you have a lab appointment with the [facility] please come in thru the Main Entrance and check in at the main information desk.  You need to re-schedule your appointment should you arrive 10 or more minutes late.  We strive to give you quality time with our providers, and arriving late affects you and other patients whose appointments are after yours.  Also, if you no show three or more times for appointments you may be dismissed from the clinic at the providers discretion.     Again, thank you for choosing [facility].  Our hope is that these requests will decrease the amount of time that you wait before being seen by our physicians.       _____________________________________________________________  Should you have questions after your visit to [facility], please contact our office at [phone] and follow the prompts.  Our office hours are 8:00 a.m. and 4:30 p.m. Monday - Friday.  Please note that voicemails left after 4:00 p.m. may not be returned until the following business day.  We are closed weekends and major holidays.  You do have access to a nurse 24-7, just call the main number to the clinic [phone] and do not press any options, hold on  the line and a nurse will answer the phone.    For prescription refill requests, have your pharmacy contact our office and allow 72 hours.    Due to Covid, you will need to wear a mask upon entering the hospital. If you do not have a mask, a mask will be given to you at the Main Entrance upon arrival. For doctor visits, patients may have 1 support person age 24 or older with them. For treatment visits, patients can not have anyone with them due to social distancing guidelines and our immunocompromised population.

## 2021-09-17 NOTE — ED Triage Notes (Signed)
Pt come from the cancer center at [facility] with c/o new onset a fib. Pt also c/o sob and dizziness since his last round of chemo 3 weeks ago today.

## 2021-09-17 NOTE — H&P (Signed)
History and Physical    Darryl Ward VCB:[id number] DOB: 04/24/1945 DOA: 09/17/2021  PCP: [name], NP   Patient coming from: Cancer center  Chief Complaint: Elevated heart rate  HPI: Darryl Ward is a 76 y.o. male with medical history significant for CAD, PVD, CVA, hypertension, dyslipidemia, and stage II lung adenocarcinoma who presented from the cancer center on account of noted elevated heart rates.  He has apparently been having exertional shortness of breath over the last 3 weeks with some associated dizziness.  He denies any chest pain or palpitations and states that the symptoms began shortly after receiving chemotherapy with immunotherapy approximately 3 weeks ago.  He figured that much of his symptoms were attributed to his treatment and did not realize that he could have another issue.  He denies any cough, fevers, chills, or sick contacts.   ED Course: Vital signs with elevated heart rate and EKG demonstrating atrial fibrillation with RVR.  Hemoglobin 9.5 and platelet count of 123.  Creatinine 1.47.  Chest x-ray with no acute findings.  He was started on Cardizem drip as well as heparin drip in the ED.  Review of Systems: Reviewed as noted above, otherwise negative.  Past Medical History:  Diagnosis Date   AAA (abdominal aortic aneurysm) (HCC)    Arthritis    Carotid artery disease ([place])    Nonobstructive   Cataract    Coronary atherosclerosis of native coronary artery    PTCA small diagonal 2007 otherwise nonobstructive CAD, EF 50-55%   Essential hypertension, benign    Hyperlipidemia    NSTEMI (non-ST elevated myocardial infarction) ([place]) 2007   Stroke [facility]) 2004   TIA (transient ischemic attack) 2006    Past Surgical History:  Procedure Laterality Date   AORTA - BILATERAL FEMORAL ARTERY BYPASS GRAFT  01/08/2012   Procedure: AORTA BIFEMORAL BYPASS GRAFT;  Surgeon: Elam Dutch, MD;  Location: [facility] OR;  Service: Vascular;  Laterality: Bilateral;   using 18x33mm x 40cm Hemashield Gold Vascular Graft with Endarterectomy, Thombectomy and  Reimplantation of Inferior Mesenteric Artery   BRONCHIAL BIOPSY  06/11/2021   Procedure: BRONCHIAL BIOPSIES;  Surgeon: [name], MD;  Location: [facility] ENDOSCOPY;  Service: Pulmonary;;   BRONCHIAL BRUSHINGS  06/11/2021   Procedure: BRONCHIAL BRUSHINGS;  Surgeon: [name], MD;  Location: [facility] ENDOSCOPY;  Service: Pulmonary;;   BRONCHIAL NEEDLE ASPIRATION BIOPSY  06/11/2021   Procedure: BRONCHIAL NEEDLE ASPIRATION BIOPSIES;  Surgeon: [name], MD;  Location: [facility] ENDOSCOPY;  Service: Pulmonary;;   COLONOSCOPY N/A 04/14/2019   Procedure: COLONOSCOPY;  Surgeon: Daneil Dolin, MD;  Location: [facility] ENDO SUITE;  Service: Endoscopy;  Laterality: N/A;  9:30   COLONOSCOPY WITH PROPOFOL N/A 07/25/2021   Procedure: COLONOSCOPY WITH PROPOFOL;  Surgeon: Eloise Harman, DO;  Location: [facility] ENDO SUITE;  Service: Endoscopy;  Laterality: N/A;  1:00pm   FIDUCIAL MARKER PLACEMENT  06/11/2021   Procedure: FIDUCIAL MARKER PLACEMENT;  Surgeon: [name], MD;  Location: [facility] ENDOSCOPY;  Service: Pulmonary;;   IR IMAGING GUIDED PORT INSERTION  07/31/2021   Left cataract surgery     POLYPECTOMY  04/14/2019   Procedure: POLYPECTOMY;  Surgeon: Daneil Dolin, MD;  Location: [facility] ENDO SUITE;  Service: Endoscopy;;   POLYPECTOMY  07/25/2021   Procedure: POLYPECTOMY;  Surgeon: Eloise Harman, DO;  Location: [facility] ENDO SUITE;  Service: Endoscopy;;   TRANSFORAMINAL LUMBAR INTERBODY FUSION (TLIF) WITH PEDICLE SCREW FIXATION 1 LEVEL N/A 04/27/2020   Procedure: Transforaminal Lumbar Interbody  Fusion Lumbar Five-Sacral One;  Surgeon: Vallarie Mare, MD;  Location: [facility];  Service: Neurosurgery;  Laterality: N/A;   VIDEO BRONCHOSCOPY WITH RADIAL ENDOBRONCHIAL ULTRASOUND  06/11/2021   Procedure: VIDEO BRONCHOSCOPY WITH RADIAL ENDOBRONCHIAL ULTRASOUND;  Surgeon: [name], MD;  Location: [facility] ENDOSCOPY;  Service: Pulmonary;;      reports that he has been smoking cigarettes. He has a 40.00 pack-year smoking history. He has never used smokeless tobacco. He reports that he does not drink alcohol and does not use drugs.  No Known Allergies  Family History  Problem Relation Age of Onset   Hyperlipidemia Sister    Heart attack Brother 104   Cancer - Colon Neg Hx     Prior to Admission medications   Medication Sig Start Date End Date Taking? Authorizing Provider  aspirin EC 81 MG tablet Take 1 tablet (81 mg total) by mouth daily. Okay to restart this medication on 06/12/2021 06/11/21   [name], MD  atorvastatin (LIPITOR) 80 MG tablet Take 80 mg by mouth daily.    [provider]  CARBOPLATIN IV Inject into the vein every 21 ( twenty-one) days. 08/06/21   [provider]  cyclobenzaprine (FLEXERIL) 10 MG tablet Take 10 mg by mouth 2 (two) times daily as needed for muscle spasms. 07/09/21   [provider]  folic acid (FOLVITE) 1 MG tablet Take 1 tablet (1 mg total) by mouth daily. 07/26/21   Derek Jack, MD  furosemide (LASIX) 20 MG tablet Take 1 tablet (20 mg total) by mouth daily as needed. 07/26/21   Derek Jack, MD  lidocaine-prilocaine (EMLA) cream Apply a small amount to port a cath site and cover with plastic wrap 1 hour prior to infusion appointments 08/02/21   Derek Jack, MD  metoprolol succinate (TOPROL-XL) 25 MG 24 hr tablet Take 1 tablet (25 mg total) by mouth daily. 02/16/20   Ivy Lynn, NP  Nivolumab (OPDIVO IV) Inject into the vein.    [provider]  PEMETREXED IV Inject into the vein every 21 ( twenty-one) days. 08/06/21   [provider]  predniSONE (DELTASONE) 10 MG tablet Take 1 tablet (10 mg total) by mouth daily with breakfast. 09/17/21   Derek Jack, MD  prochlorperazine (COMPAZINE) 10 MG tablet Take 1 tablet (10 mg total) by mouth every 6 (six) hours as needed (Nausea or vomiting). 08/02/21   Derek Jack, MD   triamcinolone cream (KENALOG) 0.1 % Apply topically 2 (two) times daily. 08/16/21   [provider]    Physical Exam: Vitals:   09/17/21 1200 09/17/21 1245 09/17/21 1300 09/17/21 1330  BP: 92/76  102/62 97/72  Pulse: 67 78 64 62  Resp: 16 14 19 12   Temp:      TempSrc:      SpO2: 96% 99% 100% 97%  Weight:      Height:        Constitutional: NAD, calm, comfortable Vitals:   09/17/21 1200 09/17/21 1245 09/17/21 1300 09/17/21 1330  BP: 92/76  102/62 97/72  Pulse: 67 78 64 62  Resp: 16 14 19 12   Temp:      TempSrc:      SpO2: 96% 99% 100% 97%  Weight:      Height:       Eyes: lids and conjunctivae normal Neck: normal, supple Respiratory: clear to auscultation bilaterally. Normal respiratory effort. No accessory muscle use.  Cardiovascular: Irregular and tachycardic. Abdomen: no tenderness, no distention. Bowel sounds positive.  Musculoskeletal:  No edema. Skin: no rashes, lesions, ulcers.  Psychiatric: Flat affect  Labs on Admission: I have personally reviewed following labs and imaging studies  CBC: Recent Labs  Lab 09/17/21 0917 09/17/21 1150  WBC 4.5 4.6  NEUTROABS 1.9 2.2  HGB 10.2* 9.5*  HCT 29.9* 28.0*  MCV 91.4 92.1  PLT 137* 532*   Basic Metabolic Panel: Recent Labs  Lab 09/17/21 0917 09/17/21 1150  NA 142 139  K 3.4* 3.5  CL 106 105  CO2 25 25  GLUCOSE 110* 120*  BUN 19 18  CREATININE 1.51* 1.47*  CALCIUM 8.6* 8.2*  MG 1.8 2.5*   GFR: Estimated Creatinine Clearance: 44.4 mL/min (A) (by C-G formula based on SCr of 1.47 mg/dL (H)). Liver Function Tests: Recent Labs  Lab 09/17/21 0917  AST 20  ALT 15  ALKPHOS 45  BILITOT 0.6  PROT 6.7  ALBUMIN 3.2*   No results for input(s): "LIPASE", "AMYLASE" in the last 168 hours. No results for input(s): "AMMONIA" in the last 168 hours. Coagulation Profile: Recent Labs  Lab 09/17/21 1158  INR 1.2   Cardiac Enzymes: No results for input(s): "CKTOTAL", "CKMB", "CKMBINDEX",  "TROPONINI" in the last 168 hours. BNP (last 3 results) No results for input(s): "PROBNP" in the last 8760 hours. HbA1C: No results for input(s): "HGBA1C" in the last 72 hours. CBG: No results for input(s): "GLUCAP" in the last 168 hours. Lipid Profile: No results for input(s): "CHOL", "HDL", "LDLCALC", "TRIG", "CHOLHDL", "LDLDIRECT" in the last 72 hours. Thyroid Function Tests: No results for input(s): "TSH", "T4TOTAL", "FREET4", "T3FREE", "THYROIDAB" in the last 72 hours. Anemia Panel: No results for input(s): "VITAMINB12", "FOLATE", "FERRITIN", "TIBC", "IRON", "RETICCTPCT" in the last 72 hours. Urine analysis:    Component Value Date/Time   COLORURINE STRAW (A) 02/04/2020 1500   APPEARANCEUR CLEAR 02/04/2020 1500   LABSPEC 1.004 (L) 02/04/2020 1500   PHURINE 6.0 02/04/2020 1500   GLUCOSEU NEGATIVE 02/04/2020 1500   HGBUR NEGATIVE 02/04/2020 1500   BILIRUBINUR NEGATIVE 02/04/2020 1500   KETONESUR NEGATIVE 02/04/2020 1500   PROTEINUR NEGATIVE 02/04/2020 1500   UROBILINOGEN 1.0 01/10/2012 1728   NITRITE NEGATIVE 02/04/2020 1500   LEUKOCYTESUR NEGATIVE 02/04/2020 1500    Radiological Exams on Admission: DG Chest 2 View  Result Date: 09/17/2021 CLINICAL DATA:  Shortness of breath and dizziness. EXAM: CHEST - 2 VIEW COMPARISON:  Chest radiograph 06/11/2021 FINDINGS: A left chest wall port is in place with tip terminating at the cavoatrial junction. The cardiomediastinal silhouette is stable. There is no focal consolidation or pulmonary edema. There is no pleural effusion or pneumothorax. A fiducial marker is again seen in the right upper lobe with decreased surrounding density likely reflecting resolved post biopsy hemorrhage. There is no acute osseous abnormality. IMPRESSION: 1. Fiducial marker in place in the right upper lobe with decreased surrounding density likely reflecting resolved post biopsy hemorrhage. 2. No radiographic evidence of acute cardiopulmonary process.  Electronically Signed   By: Valetta Mole M.D.   On: 09/17/2021 12:35    EKG: Independently reviewed. Afib 130bpm.  Assessment/Plan Principal Problem:   New onset a-fib ([place]) Active Problems:   Aneurysm of abdominal vessel (HCC)   Mixed hyperlipidemia   Coronary atherosclerosis of native coronary artery   PVD (peripheral vascular disease) (HCC)   Tobacco abuse   Right hemiparesis (HCC)   Primary adenocarcinoma of upper lobe of right lung (HCC)    Symptomatic new onset atrial fibrillation with RVR Cardizem drip as ordered per EDP Heparin drip  for now given CHA2DS2-VASc score of 5 TSH and 2D echocardiogram Hold home metoprolol given soft blood pressure readings Appreciate cardiology recommendations  AKI Creatinine currently 1.47 and baseline is near 1 Gentle IV fluid and avoid nephrotoxic agents Monitor strict I's and O's Follow labs in a.m.  Mild hypokalemia Currently resolved, monitor  History of CAD with prior MI Continue aspirin, statin, and metoprolol  Prior CVA/PVD Continue aspirin and statin  Chronic anemia/thrombocytopenia Monitor carefully while on heparin drip  Hypertension Softer blood pressure readings noted, continue Cardizem drip and hold metoprolol  Dyslipidemia Continue statin  Stage II lung adenocarcinoma Therapy to continue in the outpatient setting once stabilized Follows with Dr. Delton Coombes  Tobacco abuse Quit smoking approximately 5 weeks ago   DVT prophylaxis: Heparin drip Code Status: Full Family Communication: Partner at bedside 8/28 Disposition Plan:Admit for new onset afib RVR Consults called:Cardiology Admission status: Inpatient, SDU  Severity of Illness: The appropriate patient status for this patient is INPATIENT. Inpatient status is judged to be reasonable and necessary in order to provide the required intensity of service to ensure the patient's safety. The patient's presenting symptoms, physical exam findings, and initial  radiographic and laboratory data in the context of their chronic comorbidities is felt to place them at high risk for further clinical deterioration. Furthermore, it is not anticipated that the patient will be medically stable for discharge from the hospital within 2 midnights of admission.   * I certify that at the point of admission it is my clinical judgment that the patient will require inpatient hospital care spanning beyond 2 midnights from the point of admission due to high intensity of service, high risk for further deterioration and high frequency of surveillance required.*   Marcelene Weidemann D Quaron Delacruz DO [facility]  If 7PM-7AM, please contact night-coverage [email or web address]  09/17/2021, 2:52 PM

## 2021-09-17 NOTE — Progress Notes (Signed)
Darryl Ward, Darryl Ward [id number]   CLINIC:  Medical Oncology/Hematology  PCP:  [name], NP [name] 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  PRIOR THERAPY: none  NGS Results: not done  CURRENT THERAPY: Neoadjuvant chemoimmunotherapy  BRIEF ONCOLOGIC HISTORY:  Oncology History  Primary adenocarcinoma of upper lobe of right lung ([place])  06/26/2021 Initial Diagnosis   Primary adenocarcinoma of upper lobe of right lung ([place])   08/06/2021 -  Chemotherapy   Patient is on Treatment Plan : LUNG NSCLC Pemetrexed + Carboplatin q21d x 4 Cycles       CANCER STAGING:  Cancer Staging  Primary adenocarcinoma of upper lobe of right lung ([place]) Staging form: Lung, AJCC 8th Edition - Clinical stage from 06/26/2021: cT1, cN1, cM0 - Unsigned   INTERVAL HISTORY:  [name], a 76 y.o. male, seen for follow-up of right lung cancer.  He has received a second cycle of chemotherapy with nivolumab 3 weeks ago.  He had running eyes and nose.  He reported floaters in his eyes, colorful associated with headaches and double vision lasted for 1 week which started 1 week after Treatment.  He also reported soreness in his mouth without any source.  Dyspnea on exertion present.  Had some nausea but denied any vomiting.  He had diarrhea for couple of days after treatment.  He reports generalized weakness.  REVIEW OF SYSTEMS:  Review of Systems  Constitutional:  Positive for fatigue. Negative for appetite change and unexpected weight change.  Respiratory:  Positive for shortness of breath.   Gastrointestinal:  Negative for nausea and vomiting.  Musculoskeletal:  Negative for arthralgias.  Neurological:  Positive for dizziness and numbness.  All other systems reviewed and are negative.   PAST MEDICAL/SURGICAL HISTORY:  Past Medical History:  Diagnosis Date   AAA (abdominal  aortic aneurysm) (HCC)    Arthritis    Carotid artery disease ([place])    Nonobstructive   Cataract    Coronary atherosclerosis of native coronary artery    PTCA small diagonal 2007 otherwise nonobstructive CAD, EF 50-55%   Essential hypertension, benign    Hyperlipidemia    NSTEMI (non-ST elevated myocardial infarction) ([place]) 2007   Stroke [facility]) 2004   TIA (transient ischemic attack) 2006   Past Surgical History:  Procedure Laterality Date   AORTA - BILATERAL FEMORAL ARTERY BYPASS GRAFT  01/08/2012   Procedure: AORTA BIFEMORAL BYPASS GRAFT;  Surgeon: Elam Dutch, MD;  Location: [facility] OR;  Service: Vascular;  Laterality: Bilateral;  using 18x59m x 40cm Hemashield Gold Vascular Graft with Endarterectomy, Thombectomy and  Reimplantation of Inferior Mesenteric Artery   BRONCHIAL BIOPSY  06/11/2021   Procedure: BRONCHIAL BIOPSIES;  Surgeon: BCollene Gobble MD;  [facility];  Service: Pulmonary;;   BRONCHIAL BRUSHINGS  06/11/2021   Procedure: BRONCHIAL BRUSHINGS;  Surgeon: BCollene Gobble MD;  [facility];  Service: Pulmonary;;   BRONCHIAL NEEDLE ASPIRATION BIOPSY  06/11/2021   Procedure: BRONCHIAL NEEDLE ASPIRATION BIOPSIES;  Surgeon: BCollene Gobble MD;  [facility];  Service: Pulmonary;;   COLONOSCOPY N/A 04/14/2019   Procedure: COLONOSCOPY;  Surgeon: RDaneil Dolin MD;  Location: [facility] ENDO SUITE;  Service: Endoscopy;  Laterality: N/A;  9:30   COLONOSCOPY WITH PROPOFOL N/A 07/25/2021   Procedure: COLONOSCOPY WITH PROPOFOL;  Surgeon: CEloise Harman DO;  Location: [facility] ENDO SUITE;  Service: Endoscopy;  Laterality: N/A;  1:00pm   FIDUCIAL MARKER PLACEMENT  06/11/2021   Procedure: FIDUCIAL MARKER PLACEMENT;  Surgeon: [name], MD;  Location[facility] ENDOSCOPY;  Service: Pulmonary;;   IR IMAGING GUIDED PORT INSERTION  07/31/2021   Left cataract surgery     POLYPECTOMY  04/14/2019   Procedure: POLYPECTOMY;  Surgeon: Daneil Dolin, MD;  [facility] ENDO SU[facility];   Service: Endoscopy;;   POLYPECTOMY  07/25/2021   Procedure: POLYPECTOMY;  Surgeon: Eloise Harman, DO;  [facility] ENDO SU[facility];  Service: Endoscopy;;   TRANSFORAMINAL LUMBAR INTERBODY FUSION (TLIF) WITH PEDICLE SCREW FIXATION 1 LEVEL N/A 04/27/2020   Procedure: Transforaminal Lumbar Interbody Fusion Lumbar Five-Sacral One;  Surgeon: Vallarie Mare, MD;  Location: [facility];  Service: Neurosurgery;  Laterality: N/A;   VIDEO BRONCHOSCOPY WITH RADIAL ENDOBRONCHIAL ULTRASOUND  06/11/2021   Procedure: VIDEO BRONCHOSCOPY WITH RADIAL ENDOBRONCHIAL ULTRASOUND;  Surgeon: [name], MD;  Location: [facility] ENDOSCOPY;  Service: Pulmonary;;    SOCIAL HISTORY:  Social History   Socioeconomic History   Marital status: Divorced    Spouse name: Not on file   Number of children: 1   Years of education: 11   Highest education level: 11th grade  Occupational History    Employer: Probation officer  Tobacco Use   Smoking status: Every Day    Packs/day: 1.00    Years: 40.00    Total pack years: 40.00    Types: Cigarettes   Smokeless tobacco: Never   Tobacco comments:    1 pack of cigarettes smoked daily. 07/17/21 ARJ, RN   Vaping Use   Vaping Use: Never used  Substance and Sexual Activity   Alcohol use: No    Comment: Prior history of regular alcohol use   Drug use: No   Sexual activity: Not Currently  Other Topics Concern   Not on file  Social History Narrative   Not on file   Social Determinants of Health   Financial Resource Strain: Low Risk  (11/29/2019)   Overall Financial Resource Strain (CARDIA)    Difficulty of Paying Living Expenses: Not hard at all  Food Insecurity: No Food Insecurity (11/29/2019)   Hunger Vital Sign    Worried About Running Out of Food in the Last Year: Never true    Ran Out of Food in the Last Year: Never true  Transportation Needs: No Transportation Needs (11/29/2019)   PRAPARE - Hydrologist (Medical): No    Lack of Transportation  (Non-Medical): No  Physical Activity: Inactive (11/29/2019)   Exercise Vital Sign    Days of Exercise per Week: 0 days    Minutes of Exercise per Session: 0 min  Stress: No Stress Concern Present (11/29/2019)   [place]    Feeling of Stress : Only a little  Social Connections: Moderately Isolated (11/29/2019)   Social Connection and Isolation Panel [NHANES]    Frequency of Communication with Friends and Family: More than three times a week    Frequency of Social Gatherings with Friends and Family: More than three times a week    Attends Religious Services: 1 to 4 times per year    Active Member of [employer] or Organizations: No    Attends Archivist Meetings: Never    Marital Status: Divorced  Human resources officer Violence: Not At Risk (11/20/2018)   Humiliation, Afraid, Rape, and Kick questionnaire    Fear of Current  or Ex-Partner: No    Emotionally Abused: No    Physically Abused: No    Sexually Abused: No    FAMILY HISTORY:  Family History  Problem Relation Age of Onset   Hyperlipidemia Sister    Heart attack Brother 100   Cancer - Colon Neg Hx     CURRENT MEDICATIONS:  No current facility-administered medications for this visit.   Current Outpatient Medications  Medication Sig Dispense Refill   aspirin EC 81 MG tablet Take 1 tablet (81 mg total) by mouth daily. Okay to restart this medication on 06/12/2021 (Patient taking differently: Take 81 mg by mouth daily.) 90 tablet 3   atorvastatin (LIPITOR) 80 MG tablet Take 80 mg by mouth daily.     CARBOPLATIN IV Inject into the vein every 21 ( twenty-one) days.     cyclobenzaprine (FLEXERIL) 10 MG tablet Take 10 mg by mouth 2 (two) times daily as needed for muscle spasms.     folic acid (FOLVITE) 1 MG tablet Take 1 tablet (1 mg total) by mouth daily. 90 tablet 2   furosemide (LASIX) 20 MG tablet Take 1 tablet (20 mg total) by mouth daily as needed. (Patient  taking differently: Take 20 mg by mouth every other day.) 30 tablet 2   lidocaine-prilocaine (EMLA) cream Apply a small amount to port a cath site and cover with plastic wrap 1 hour prior to infusion appointments 30 g 3   metoprolol succinate (TOPROL-XL) 25 MG 24 hr tablet Take 1 tablet (25 mg total) by mouth daily. (Patient not taking: Reported on 09/17/2021) 90 tablet 3   PEMETREXED IV Inject into the vein every 21 ( twenty-one) days.     predniSONE (DELTASONE) 10 MG tablet Take 1 tablet (10 mg total) by mouth daily with breakfast. 30 tablet 0   prochlorperazine (COMPAZINE) 10 MG tablet Take 1 tablet (10 mg total) by mouth every 6 (six) hours as needed (Nausea or vomiting). 60 tablet 3   Nivolumab (OPDIVO IV) Inject into the vein.     Facility-Administered Medications Ordered in Other Visits  Medication Dose Route Frequency Provider Last Rate Last Admin   0.9 %  sodium chloride infusion  250 mL Intravenous PRN Manuella Ghazi, Pratik D, DO       acetaminophen (TYLENOL) tablet 650 mg  650 mg Oral Q6H PRN Manuella Ghazi, Pratik D, DO       Or   acetaminophen (TYLENOL) suppository 650 mg  650 mg Rectal Q6H PRN Manuella Ghazi, Pratik D, DO       aspirin EC tablet 81 mg  81 mg Oral Daily Manuella Ghazi, Pratik D, DO   81 mg at 09/17/21 1619   atorvastatin (LIPITOR) tablet 80 mg  80 mg Oral Daily Heath Lark D, DO   80 mg at 09/17/21 1620   cyclobenzaprine (FLEXERIL) tablet 10 mg  10 mg Oral BID PRN Manuella Ghazi, Pratik D, DO       folic acid (FOLVITE) tablet 1 mg  1 mg Oral Daily Manuella Ghazi, Pratik D, DO   1 mg at 09/17/21 1619   heparin ADULT infusion 100 units/mL (25000 units/219m)  1,000 Units/hr Intravenous Continuous SHeath LarkD, DO 10 mL/hr at 09/17/21 1432 1,000 Units/hr at 09/17/21 1432   metoprolol succinate (TOPROL-XL) 24 hr tablet 50 mg  50 mg Oral BID Furth, Cadence H, PA-C       ondansetron (ZOFRAN) tablet 4 mg  4 mg Oral Q6H PRN SManuella Ghazi Pratik D, DO       Or  ondansetron (ZOFRAN) injection 4 mg  4 mg Intravenous Q6H PRN Manuella Ghazi, Pratik  D, DO       [START ON 09/18/2021] predniSONE (DELTASONE) tablet 10 mg  10 mg Oral Q breakfast Manuella Ghazi, Pratik D, DO       sodium chloride flush (NS) 0.9 % injection 3 mL  3 mL Intravenous Q12H Shah, Pratik D, DO       sodium chloride flush (NS) 0.9 % injection 3 mL  3 mL Intravenous PRN Manuella Ghazi, Pratik D, DO        ALLERGIES:  No Known Allergies  PHYSICAL EXAM:  Performance status (ECOG): 1 - Symptomatic but completely ambulatory  Vitals:   09/17/21 0901  BP: 100/61  Pulse: 69  Resp: 17  Temp: 97.8 F (36.6 C)  SpO2: 100%   Wt Readings from Last 3 Encounters:  09/17/21 156 lb 14.4 oz (71.2 kg)  09/17/21 156 lb 14.4 oz (71.2 kg)  08/27/21 164 lb 6.4 oz (74.6 kg)   Physical Exam Vitals reviewed.  Constitutional:      Appearance: Normal appearance.  Cardiovascular:     Rate and Rhythm: Normal rate and regular rhythm.     Pulses: Normal pulses.     Heart sounds: Normal heart sounds.  Pulmonary:     Effort: Pulmonary effort is normal.     Breath sounds: Normal breath sounds.  Neurological:     General: No focal deficit present.     Mental Status: He is alert and oriented to person, place, and time.  Psychiatric:        Mood and Affect: Mood normal.        Behavior: Behavior normal.      LABORATORY DATA:  I have reviewed the labs as listed.     Latest Ref Rng & Units 09/17/2021   11:50 AM 09/17/2021    9:17 AM 08/27/2021   10:01 AM  CBC  WBC 4.0 - 10.5 K/uL 4.6  4.5  8.7   Hemoglobin 13.0 - 17.0 g/dL 9.5  10.2  12.0   Hematocrit 39.0 - 52.0 % 28.0  29.9  35.3   Platelets 150 - 400 K/uL 123  137  157       Latest Ref Rng & Units 09/17/2021   11:50 AM 09/17/2021    9:17 AM 08/27/2021   10:01 AM  CMP  Glucose 70 - 99 mg/dL 120  110  95   BUN 8 - 23 mg/dL 18  19  23    Creatinine 0.61 - 1.24 mg/dL 1.47  1.51  1.15   Sodium 135 - 145 mmol/L 139  142  136   Potassium 3.5 - 5.1 mmol/L 3.5  3.4  3.7   Chloride 98 - 111 mmol/L 105  106  105   CO2 22 - 32 mmol/L 25  25  24     Calcium 8.9 - 10.3 mg/dL 8.2  8.6  8.8   Total Protein 6.5 - 8.1 g/dL  6.7  7.2   Total Bilirubin 0.3 - 1.2 mg/dL  0.6  0.5   Alkaline Phos 38 - 126 U/L  45  63   AST 15 - 41 U/L  20  16   ALT 0 - 44 U/L  15  21     DIAGNOSTIC IMAGING:  I have independently reviewed the scans and discussed with the patient. ECHOCARDIOGRAM COMPLETE  Result Date: 09/17/2021    ECHOCARDIOGRAM REPORT   Patient Name:   NAZIAH WECKERLY Date of Exam: 09/17/2021  Medical Rec #:  [id number]        Height:       70.0 in Accession #:    [id number]       Weight:       156.9 lb Date of Birth:  04-17-1945         BSA:          1.883 m Patient Age:    65 years         BP:           114/75 mmHg Patient Gender: M                HR:           96 bpm. Exam Location:  [facility] Procedure: 2D Echo, Cardiac Doppler and Color Doppler Indications:     Atrial Fibrillation I48.91  History:         Patient has prior history of Echocardiogram examinations, most                  recent 02/05/2020. COPD and Stroke; Risk Factors:Hypertension                  and Dyslipidemia. Primary adenocarcinoma of upper lobe of right                  lung ([place]), Tobacco abuse, NSTEMI (non-ST elevated myocardial                  infarction) ([place]) (From Hx).  Sonographer:     Alvino Chapel RCS Referring Phys:  [id number] Royanne Foots [facility] Diagnosing Phys: Candee Furbish MD IMPRESSIONS  1. Left ventricular ejection fraction, by estimation, is 35 to 40%. The left ventricle has moderately decreased function. The left ventricle demonstrates global hypokinesis. Left ventricular diastolic parameters are indeterminate.  2. Right ventricular systolic function is normal. The right ventricular size is normal. There is normal pulmonary artery systolic pressure. The estimated right ventricular systolic pressure is 31.5 mmHg.  3. Left atrial size was moderately dilated.  4. The mitral valve is normal in structure. Mild mitral valve regurgitation. No evidence of mitral stenosis.  5.  Tricuspid valve regurgitation is moderate.  6. The aortic valve is normal in structure. Aortic valve regurgitation is not visualized. No aortic stenosis is present.  7. The inferior vena cava is dilated in size with >50% respiratory variability, suggesting right atrial pressure of 8 mmHg. Comparison(s): Prior images reviewed side by side. The left ventricular function is worsened. Prior EF 60%. FINDINGS  Left Ventricle: Left ventricular ejection fraction, by estimation, is 35 to 40%. The left ventricle has moderately decreased function. The left ventricle demonstrates global hypokinesis. The left ventricular internal cavity size was normal in size. There is no left ventricular hypertrophy. Left ventricular diastolic parameters are indeterminate. Right Ventricle: The right ventricular size is normal. No increase in right ventricular wall thickness. Right ventricular systolic function is normal. There is normal pulmonary artery systolic pressure. The tricuspid regurgitant velocity is 2.30 m/s, and  with an assumed right atrial pressure of 8 mmHg, the estimated right ventricular systolic pressure is 40.0 mmHg. Left Atrium: Left atrial size was moderately dilated. Right Atrium: Right atrial size was normal in size. Pericardium: There is no evidence of pericardial effusion. Mitral Valve: The mitral valve is normal in structure. Mild mitral valve regurgitation. No evidence of mitral valve stenosis. MV peak gradient, 3.8 mmHg. The mean mitral valve gradient is 1.0 mmHg. Tricuspid Valve: The tricuspid  valve is normal in structure. Tricuspid valve regurgitation is moderate . No evidence of tricuspid stenosis. Aortic Valve: The aortic valve is normal in structure. Aortic valve regurgitation is not visualized. No aortic stenosis is present. Pulmonic Valve: The pulmonic valve was normal in structure. Pulmonic valve regurgitation is not visualized. No evidence of pulmonic stenosis. Aorta: The aortic root is normal in size and  structure. Venous: The inferior vena cava is dilated in size with greater than 50% respiratory variability, suggesting right atrial pressure of 8 mmHg. IAS/Shunts: No atrial level shunt detected by color flow Doppler.  LEFT VENTRICLE PLAX 2D LVIDd:         4.80 cm LVIDs:         3.70 cm LV PW:         0.90 cm LV IVS:        0.90 cm LVOT diam:     2.20 cm LV SV:         56 LV SV Index:   30 LVOT Area:     3.80 cm  RIGHT VENTRICLE TAPSE (M-mode): 1.6 cm LEFT ATRIUM             Index        RIGHT ATRIUM           Index LA diam:        4.20 cm 2.23 cm/m   RA Area:     18.00 cm LA Vol (A2C):   90.3 ml 47.96 ml/m  RA Volume:   40.80 ml  21.67 ml/m LA Vol (A4C):   68.9 ml 36.60 ml/m LA Biplane Vol: 79.7 ml 42.33 ml/m  AORTIC VALVE LVOT Vmax:   72.50 cm/s LVOT Vmean:  49.000 cm/s LVOT VTI:    0.148 m  AORTA Ao Root diam: 4.00 cm MITRAL VALVE               TRICUSPID VALVE MV Area (PHT): 4.60 cm    TR Peak grad:   21.2 mmHg MV Area VTI:   2.20 cm    TR Vmax:        230.00 cm/s MV Peak grad:  3.8 mmHg MV Mean grad:  1.0 mmHg    SHUNTS MV Vmax:       0.97 m/s    Systemic VTI:  0.15 m MV Vmean:      40.0 cm/s   Systemic Diam: 2.20 cm MV Decel Time: 165 msec MV E velocity: 91.70 cm/s Candee Furbish MD Electronically signed by Candee Furbish MD Signature Date/Time: 09/17/2021/4:49:21 PM    Final (Updated)    DG Chest 2 View  Result Date: 09/17/2021 CLINICAL DATA:  Shortness of breath and dizziness. EXAM: CHEST - 2 VIEW COMPARISON:  Chest radiograph 06/11/2021 FINDINGS: A left chest wall port is in place with tip terminating at the cavoatrial junction. The cardiomediastinal silhouette is stable. There is no focal consolidation or pulmonary edema. There is no pleural effusion or pneumothorax. A fiducial marker is again seen in the right upper lobe with decreased surrounding density likely reflecting resolved post biopsy hemorrhage. There is no acute osseous abnormality. IMPRESSION: 1. Fiducial marker in place in the right  upper lobe with decreased surrounding density likely reflecting resolved post biopsy hemorrhage. 2. No radiographic evidence of acute cardiopulmonary process. Electronically Signed   By: Valetta Mole M.D.   On: 09/17/2021 12:35     ASSESSMENT:  Stage II (T1 N1 M0) right upper lobe adenocarcinoma: - CT chest on 06/08/2021: 1.2 x 1.1  cm subsolid subpleural nodule of the peripheral posterior right upper lobe.  Occasional additional small pulmonary nodules measuring 0.4 cm and smaller. - Bronchoscopy (06/11/2021): RUL nodule brushing and FNA: Malignant cells with features of adenocarcinoma. - PET scan (06/22/2021): Subsolid nodular lesion in the right upper lobe, hypermetabolic SUV 15.  Small right hilar and infrahilar lymph nodes with SUV 4.03 concerning for metastatic adenopathy.  Small hypermetabolic left parotid gland lesion, consistent with previous history of Wharton's tumor.  Hypermetabolic focus in the transverse colon SUV 8.69. - MRI of the brain (07/11/2021): No evidence of metastatic disease.  Right sphenoid wing meningioma stable since 2022. - He was evaluated by Dr. Roxan Hockey and was recommended to undergo neoadjuvant chemoimmunotherapy followed by surgical resection. - Cycle 1 of carboplatin and pemetrexed on 08/06/2021. - NGS testing not performed due to insufficient sample. - [employer]: Negative for EGFR and ALK.  MSI high not detected. - Cycle 2 carboplatin, pemetrexed and opdivo on 08/27/2021.    Social/family history: - Lives at home with his wife.  Uses cane occasionally after he had stroke in January 2022.  He has retired after working in [employer].  He is current active smoker, 1 pack/day for 60 years.  No exposure to chemicals. - Brother died of metastatic cancer.  Maternal uncle had lung cancer.   PLAN:  Stage II (T1 N1 M0) right upper lobe adenocarcinoma: - He had been severely weak since last cycle of chemotherapy.       -His pulse was irregular.  Twelve-lead EKG in our  office showed atrial fibrillation with heart rate around 120.  We will refer him to emergency room for further evaluation.  His labs today showed elevated creatinine of 1.47, up from his baseline.  We will give him 1 L of normal saline with electrolytes.  I will hold his chemotherapy today.  His blood pressure is also low with systolic 465, which could be contributing to his dizziness.  He is on Toprol-XL 25 mg daily along with Lasix 20 mg daily.  Will check TSH, cortisol, ACTH, FSH, LH and growth hormones.  We will reevaluate him next week.  2.  Abdominal uptake on PET scan: - Colonoscopy on 04/14/2019 with multiple adenomatous polyps removed. - Uptake area appears to be in the transverse colon lesion.  Referral was made to Dr. Gala Romney for colonoscopy.  3.  Lower extremity swelling: - He is taking Lasix 20 mg daily.  Because of hypotension, I have recommended it to be changed every other day.   Orders placed this encounter:  Orders Placed This Encounter  Procedures   ACTH   TSH   Growth hormone   Cortisol   Follicle stimulating hormone   Luteinizing hormone      Derek Jack, MD [place] [phone]

## 2021-09-17 NOTE — ED Notes (Signed)
Pt getting echocardiogram at this time

## 2021-09-18 ENCOUNTER — Other Ambulatory Visit: Payer: Self-pay

## 2021-09-18 ENCOUNTER — Encounter (HOSPITAL_COMMUNITY): Payer: Self-pay | Admitting: Internal Medicine

## 2021-09-18 DIAGNOSIS — I4891 Unspecified atrial fibrillation: Secondary | ICD-10-CM | POA: Diagnosis not present

## 2021-09-18 DIAGNOSIS — I5041 Acute combined systolic (congestive) and diastolic (congestive) heart failure: Secondary | ICD-10-CM

## 2021-09-18 LAB — CBC
HCT: 26.5 % — ABNORMAL LOW (ref 39.0–52.0)
Hemoglobin: 8.9 g/dL — ABNORMAL LOW (ref 13.0–17.0)
MCH: 31.3 pg (ref 26.0–34.0)
MCHC: 33.6 g/dL (ref 30.0–36.0)
MCV: 93.3 fL (ref 80.0–100.0)
Platelets: 129 10*3/uL — ABNORMAL LOW (ref 150–400)
RBC: 2.84 MIL/uL — ABNORMAL LOW (ref 4.22–5.81)
RDW: 14.9 % (ref 11.5–15.5)
WBC: 5 10*3/uL (ref 4.0–10.5)
nRBC: 0 % (ref 0.0–0.2)

## 2021-09-18 LAB — GROWTH HORMONE: Growth Hormone: 1.1 ng/mL (ref 0.0–10.0)

## 2021-09-18 LAB — BASIC METABOLIC PANEL
Anion gap: 5 (ref 5–15)
BUN: 17 mg/dL (ref 8–23)
CO2: 26 mmol/L (ref 22–32)
Calcium: 8.2 mg/dL — ABNORMAL LOW (ref 8.9–10.3)
Chloride: 109 mmol/L (ref 98–111)
Creatinine, Ser: 1.22 mg/dL (ref 0.61–1.24)
GFR, Estimated: >60 mL/min (ref >60)
Glucose, Bld: 90 mg/dL (ref 70–99)
Potassium: 3.7 mmol/L (ref 3.5–5.1)
Sodium: 140 mmol/L (ref 135–145)

## 2021-09-18 LAB — TROPONIN I (HIGH SENSITIVITY): Troponin I (High Sensitivity): 10 ng/L

## 2021-09-18 LAB — ACTH: C206 ACTH: 12.5 pg/mL (ref 7.2–63.3)

## 2021-09-18 LAB — LUTEINIZING HORMONE: LH: 3.9 m[IU]/mL (ref 1.7–8.6)

## 2021-09-18 LAB — HEPARIN LEVEL (UNFRACTIONATED): Heparin Unfractionated: 0.94 IU/mL — ABNORMAL HIGH (ref 0.30–0.70)

## 2021-09-18 LAB — MAGNESIUM: Magnesium: 2 mg/dL (ref 1.7–2.4)

## 2021-09-18 LAB — FOLLICLE STIMULATING HORMONE: FSH: 7.7 m[IU]/mL (ref 1.5–12.4)

## 2021-09-18 MED ORDER — CHLORHEXIDINE GLUCONATE CLOTH 2 % EX PADS
6.0000 | MEDICATED_PAD | Freq: Every day | CUTANEOUS | Status: DC
  Administered 2021-09-18 – 2021-09-21 (×3): 6 via TOPICAL

## 2021-09-18 MED ORDER — APIXABAN 5 MG PO TABS
5.0000 mg | ORAL_TABLET | Freq: Two times a day (BID) | ORAL | Status: DC
  Administered 2021-09-18 – 2021-09-21 (×6): 5 mg via ORAL
  Filled 2021-09-18 (×7): qty 1

## 2021-09-18 NOTE — TOC Progression Note (Signed)
Transition of Care [facility]) - Progression Note    Patient Details  Name: Darryl Ward MRN: [id number] Date of Birth: 22-Mar-1945  [facility] of [facility]) CM/SW Contact  Salome Arnt, [place] Phone Number: 09/18/2021, 10:33 AM  Clinical Narrative:   Transition of Care [facility]) Screening Note   Patient Details  Name: Darryl Ward Date of Birth: 1945/06/19   Transition of Care [facility]) CM/SW Contact:    Salome Arnt, LCSW Phone Number: 09/18/2021, 10:33 AM    Transition of Care Department [facility]) has reviewed patient and no TOC needs have been identified at this time. We will continue to monitor patient advancement through interdisciplinary progression rounds. If new patient transition needs arise, please place a TOC consult.         Barriers to Discharge: Continued Medical Work up  Expected Discharge Plan and Services                                                 Social Determinants of Health (SDOH) Interventions    Readmission Risk Interventions     No data to display

## 2021-09-18 NOTE — Progress Notes (Addendum)
Rounding Note    Patient Name: Darryl Ward Date of Encounter: 09/18/2021  [facility] Cardiologist: New  Subjective   Still in Afib with rates around 100bpm. Hgb down to 8.9 today, plt 129. Patient is feeling tired. No chest pain or SOB.   Inpatient Medications    Scheduled Meds:  aspirin EC  81 mg Oral Daily   atorvastatin  80 mg Oral Daily   Chlorhexidine Gluconate Cloth  6 each Topical Daily   folic acid  1 mg Oral Daily   metoprolol succinate  50 mg Oral BID   predniSONE  10 mg Oral Q breakfast   sodium chloride flush  3 mL Intravenous Q12H   Continuous Infusions:  sodium chloride     heparin 850 Units/hr (09/18/21 0655)   PRN Meds: sodium chloride, acetaminophen **OR** acetaminophen, cyclobenzaprine, ondansetron **OR** ondansetron (ZOFRAN) IV, sodium chloride flush   Vital Signs    Vitals:   09/17/21 2345 09/17/21 2346 09/18/21 0116 09/18/21 0515  BP: 108/67  94/68 95/61  Pulse:  (!) 123 91 66  Resp:   16 16  Temp: 98.7 F (37.1 C)  97.6 F (36.4 C) 98.1 F (36.7 C)  TempSrc: Oral  Oral Oral  SpO2: 99%  100% 99%  Weight:      Height:        Intake/Output Summary (Last 24 hours) at 09/18/2021 0955 Last data filed at 09/18/2021 0655 Gross per 24 hour  Intake 464.18 ml  Output --  Net 464.18 ml      09/17/2021   11:46 AM 09/17/2021    9:01 AM 08/27/2021    9:53 AM  Last 3 Weights  Weight (lbs) 156 lb 14.4 oz 156 lb 14.4 oz 164 lb 6.4 oz  Weight (kg) 71.169 kg 71.169 kg 74.571 kg      Telemetry    Afib HR 100-120, around 100bpm - Personally Reviewed  ECG    NO new - Personally Reviewed  Physical Exam   GEN: No acute distress.   Neck: No JVD Cardiac: Irreg IRreg, no murmurs, rubs, or gallops.  Respiratory: Clear to auscultation bilaterally. GI: Soft, nontender, non-distended  MS: No edema; No deformity. Neuro:  Nonfocal  Psych: Normal affect   Labs    High Sensitivity Troponin:  No results for input(s): "TROPONINIHS" in  the last 720 hours.   Chemistry Recent Labs  Lab 09/17/21 0917 09/17/21 1150 09/18/21 0820  NA 142 139 140  K 3.4* 3.5 3.7  CL 106 105 109  CO2 25 25 26   GLUCOSE 110* 120* 90  BUN 19 18 17   CREATININE 1.51* 1.47* 1.22  CALCIUM 8.6* 8.2* 8.2*  MG 1.8 2.5* 2.0  PROT 6.7  --   --   ALBUMIN 3.2*  --   --   AST 20  --   --   ALT 15  --   --   ALKPHOS 45  --   --   BILITOT 0.6  --   --   GFRNONAA 48* 50* >60  ANIONGAP 11 9 5     Lipids No results for input(s): "CHOL", "TRIG", "HDL", "LABVLDL", "LDLCALC", "CHOLHDL" in the last 168 hours.  Hematology Recent Labs  Lab 09/17/21 0917 09/17/21 1150 09/18/21 0820  WBC 4.5 4.6 5.0  RBC 3.27* 3.04* 2.84*  HGB 10.2* 9.5* 8.9*  HCT 29.9* 28.0* 26.5*  MCV 91.4 92.1 93.3  MCH 31.2 31.3 31.3  MCHC 34.1 33.9 33.6  RDW 14.1 13.9 14.9  PLT  137* 123* 129*   Thyroid  Recent Labs  Lab 09/17/21 1023  TSH 1.534    BNPNo results for input(s): "BNP", "PROBNP" in the last 168 hours.  DDimer No results for input(s): "DDIMER" in the last 168 hours.   Radiology    ECHOCARDIOGRAM COMPLETE  Result Date: 09/17/2021    ECHOCARDIOGRAM REPORT   Patient Name:   Darryl Ward Date of Exam: 09/17/2021 Medical Rec #:  [id number]        Height:       70.0 in Accession #:    [id number]       Weight:       156.9 lb Date of Birth:  02/05/1945         BSA:          1.883 m Patient Age:    76 years         BP:           114/75 mmHg Patient Gender: M                HR:           96 bpm. Exam Location:  [facility] Procedure: 2D Echo, Cardiac Doppler and Color Doppler Indications:     Atrial Fibrillation I48.91  History:         Patient has prior history of Echocardiogram examinations, most                  recent 02/05/2020. COPD and Stroke; Risk Factors:Hypertension                  and Dyslipidemia. Primary adenocarcinoma of upper lobe of right                  lung ([place]), Tobacco abuse, NSTEMI (non-ST elevated myocardial                  infarction) ([place]) (From  Hx).  Sonographer:     Alvino Chapel RCS Referring Phys:  [id number] Royanne Foots [facility] Diagnosing Phys: Candee Furbish MD IMPRESSIONS  1. Left ventricular ejection fraction, by estimation, is 35 to 40%. The left ventricle has moderately decreased function. The left ventricle demonstrates global hypokinesis. Left ventricular diastolic parameters are indeterminate.  2. Right ventricular systolic function is normal. The right ventricular size is normal. There is normal pulmonary artery systolic pressure. The estimated right ventricular systolic pressure is 77.9 mmHg.  3. Left atrial size was moderately dilated.  4. The mitral valve is normal in structure. Mild mitral valve regurgitation. No evidence of mitral stenosis.  5. Tricuspid valve regurgitation is moderate.  6. The aortic valve is normal in structure. Aortic valve regurgitation is not visualized. No aortic stenosis is present.  7. The inferior vena cava is dilated in size with >50% respiratory variability, suggesting right atrial pressure of 8 mmHg. Comparison(s): Prior images reviewed side by side. The left ventricular function is worsened. Prior EF 60%. FINDINGS  Left Ventricle: Left ventricular ejection fraction, by estimation, is 35 to 40%. The left ventricle has moderately decreased function. The left ventricle demonstrates global hypokinesis. The left ventricular internal cavity size was normal in size. There is no left ventricular hypertrophy. Left ventricular diastolic parameters are indeterminate. Right Ventricle: The right ventricular size is normal. No increase in right ventricular wall thickness. Right ventricular systolic function is normal. There is normal pulmonary artery systolic pressure. The tricuspid regurgitant velocity is 2.30 m/s, and  with an assumed right atrial pressure of  8 mmHg, the estimated right ventricular systolic pressure is 21.3 mmHg. Left Atrium: Left atrial size was moderately dilated. Right Atrium: Right atrial size was normal in  size. Pericardium: There is no evidence of pericardial effusion. Mitral Valve: The mitral valve is normal in structure. Mild mitral valve regurgitation. No evidence of mitral valve stenosis. MV peak gradient, 3.8 mmHg. The mean mitral valve gradient is 1.0 mmHg. Tricuspid Valve: The tricuspid valve is normal in structure. Tricuspid valve regurgitation is moderate . No evidence of tricuspid stenosis. Aortic Valve: The aortic valve is normal in structure. Aortic valve regurgitation is not visualized. No aortic stenosis is present. Pulmonic Valve: The pulmonic valve was normal in structure. Pulmonic valve regurgitation is not visualized. No evidence of pulmonic stenosis. Aorta: The aortic root is normal in size and structure. Venous: The inferior vena cava is dilated in size with greater than 50% respiratory variability, suggesting right atrial pressure of 8 mmHg. IAS/Shunts: No atrial level shunt detected by color flow Doppler.  LEFT VENTRICLE PLAX 2D LVIDd:         4.80 cm LVIDs:         3.70 cm LV PW:         0.90 cm LV IVS:        0.90 cm LVOT diam:     2.20 cm LV SV:         56 LV SV Index:   30 LVOT Area:     3.80 cm  RIGHT VENTRICLE TAPSE (M-mode): 1.6 cm LEFT ATRIUM             Index        RIGHT ATRIUM           Index LA diam:        4.20 cm 2.23 cm/m   RA Area:     18.00 cm LA Vol (A2C):   90.3 ml 47.96 ml/m  RA Volume:   40.80 ml  21.67 ml/m LA Vol (A4C):   68.9 ml 36.60 ml/m LA Biplane Vol: 79.7 ml 42.33 ml/m  AORTIC VALVE LVOT Vmax:   72.50 cm/s LVOT Vmean:  49.000 cm/s LVOT VTI:    0.148 m  AORTA Ao Root diam: 4.00 cm MITRAL VALVE               TRICUSPID VALVE MV Area (PHT): 4.60 cm    TR Peak grad:   21.2 mmHg MV Area VTI:   2.20 cm    TR Vmax:        230.00 cm/s MV Peak grad:  3.8 mmHg MV Mean grad:  1.0 mmHg    SHUNTS MV Vmax:       0.97 m/s    Systemic VTI:  0.15 m MV Vmean:      40.0 cm/s   Systemic Diam: 2.20 cm MV Decel Time: 165 msec MV E velocity: 91.70 cm/s Candee Furbish MD Electronically  signed by Candee Furbish MD Signature Date/Time: 09/17/2021/4:49:21 PM    Final (Updated)    DG Chest 2 View  Result Date: 09/17/2021 CLINICAL DATA:  Shortness of breath and dizziness. EXAM: CHEST - 2 VIEW COMPARISON:  Chest radiograph 06/11/2021 FINDINGS: A left chest wall port is in place with tip terminating at the cavoatrial junction. The cardiomediastinal silhouette is stable. There is no focal consolidation or pulmonary edema. There is no pleural effusion or pneumothorax. A fiducial marker is again seen in the right upper lobe with decreased surrounding density likely reflecting resolved post biopsy hemorrhage.  There is no acute osseous abnormality. IMPRESSION: 1. Fiducial marker in place in the right upper lobe with decreased surrounding density likely reflecting resolved post biopsy hemorrhage. 2. No radiographic evidence of acute cardiopulmonary process. Electronically Signed   By: Valetta Mole M.D.   On: 09/17/2021 12:35    Cardiac Studies   Echo 09/17/21  1. Left ventricular ejection fraction, by estimation, is 35 to 40%. The  left ventricle has moderately decreased function. The left ventricle  demonstrates global hypokinesis. Left ventricular diastolic parameters are  indeterminate.   2. Right ventricular systolic function is normal. The right ventricular  size is normal. There is normal pulmonary artery systolic pressure. The  estimated right ventricular systolic pressure is 34.1 mmHg.   3. Left atrial size was moderately dilated.   4. The mitral valve is normal in structure. Mild mitral valve  regurgitation. No evidence of mitral stenosis.   5. Tricuspid valve regurgitation is moderate.   6. The aortic valve is normal in structure. Aortic valve regurgitation is  not visualized. No aortic stenosis is present.   7. The inferior vena cava is dilated in size with >50% respiratory  variability, suggesting right atrial pressure of 8 mmHg.   Comparison(s): Prior images reviewed side by  side. The left ventricular  function is worsened. Prior EF 60%.    Echo 2022  1. Left ventricular ejection fraction, by estimation, is 60 to 65%. The  left ventricle has normal function. The left ventricle has no regional  wall motion abnormalities. Left ventricular diastolic parameters were  normal.   2. Right ventricular systolic function is normal. The right ventricular  size is normal.   3. Left atrial size was moderately dilated.   4. The mitral valve is normal in structure. No evidence of mitral valve  regurgitation. No evidence of mitral stenosis.   5. The aortic valve is tricuspid. Aortic valve regurgitation is not  visualized. No aortic stenosis is present.   6. The inferior vena cava is normal in size with greater than 50%  respiratory variability, suggesting right atrial pressure of 3 mmHg.    Carotid US 2022 IMPRESSION: 1. Moderate to large amount of left-sided atherosclerotic plaque results in elevated peak systolic velocities within the left internal carotid artery compatible with the higher end of the 50-69% luminal narrowing range, compatible with the findings seen on preceding neck CTA. 2. Moderate amount of right-sided atherosclerotic plaque, not resulting in a hemodynamically significant stenosis. 3. Patent dominant right vertebral artery. 4. Nonvisualization of the left vertebral artery however the left vertebral artery is noted to be reconstituted at the level of the superior aspect of the cervical spine on preceding CTA.    Patient Profile     76 y.o. male with a h/o CAD, PVD, CVA, HTN, dyslipidemia, Stage 2 lung adenocarcinoma who is being seen 09/17/2021 for the evaluation of new onset Afib   Assessment & Plan    New onset Afib - presented with elevated HR found to be in new onset Afib with heart rates up to 140s - started on IV cardizem with improvement of HR - CHADSVASC 5 (agex1, stroke x 2, PVD, HTN) started on IV heparin. Plan long-term a/c with  Eliquis. Given chronic anemia and low plt appreciate oncology input: per oncology, OK to switch to Eliquis and will monitor counts - Keep K >4 and Mag >2 - TSH wnl - echo showed reduced EF 35-40% and dilt was stopped and Toprol 50mg  BID was added - Still  in afib with relatively controlled HR, cannot increase with soft pressures.  - Recommend switching to Eliquis and will tentatively plan for TEE/DCCV (likely Thursday if stable counts).  D/w oncology and OK to proceed with TEE/DCCV, do not anticipate counts dropping further and should be OK for uninterrupted anticoagulation.   Acute combined heart failure - Echo showed LVEF 35-40%, moderately dilated LA, mild MR - prior echo 2022 showed normal LVEF -  dilt stopped and started on Toprol 50mg  BID - PTA lasix 20mg  held - ddx includes tachycardia induced cardiomyopathy (treatment of A-fib as above), but also concern for checkpoint inhibitor myocarditis given recently started Opdivo.  Check troponins.  Will discuss with cardiooncology   AKI - Scr 1.51, BUN 19>>improved - PTA lasix 20mg  held   Hypokalemia - resolved   CAD with prior reported MI - PTA Aspirin, statin and BB therapy - No chest pain reported - will stop ASA   H/o stroke - PTA ASA and statin>stop ASA as above   Chronic anemia - Hgb 9.5>8.9 - Hgb was 12-13 one to two months ago - monitor with IV heparin   HTN - PTA Toprol 25mg  daily held - IV dilt   Stage 2 lung cancer - recently started chemotherapy   Tobacco use - he quit smoking about 5 weeks ago  For questions or updates, please contact [place] Please consult [email or web address] for contact info under     Signed, Cadence Ninfa Meeker, PA-C  09/18/2021, 9:55 AM      Patient seen and examined.  Agree with above documentation.  On exam, patient is alert and oriented, tachycardic, irregular, no murmurs, lungs CTAB, no LE edema or JVD.  Labs notable for creatinine 1.2.  BP 95/61.  Echocardiogram yesterday  showed EF 35 to 40%.  Telemetry shows A-fib with rates 90s to 120s.  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  This is complicated by him being on chemotherapy with counts dropping.  D/w Dr Delton Coombes, and he has already been through the nadir of his recent chemotherapy and will not likely drop counts further.  Ok to proceed with TEE/DCCV.  Will start Eliquis and if stable counts plan TEE/DCCV, likely on Thursday.  Recently started on Opdivo, so checkpoint inhibitor myocarditis is also on differential.  Will check troponins.  Will discuss case with Dr. Phineas Inches, our cardiooncology specialist.  Donato Heinz, MD

## 2021-09-18 NOTE — Progress Notes (Signed)
PROGRESS NOTE    Darryl Ward  OEU:[id number] DOB: 08/19/1945 DOA: 09/17/2021 PCP: Darryl Ward   Brief Narrative:    Darryl Ward is a 76 y.o. male with medical history significant for CAD, PVD, CVA, hypertension, dyslipidemia, and stage II lung adenocarcinoma who presented from the cancer center on account of noted elevated heart rates.  He was admitted with symptomatic new onset atrial fibrillation with RVR and now has new LV dysfunction in the setting of recent chemo/immunotherapy.  Cardiology following with plans for likely TEE/DCCV on 8/31.  Assessment & Plan:   Principal Problem:   Atrial fibrillation with RVR (HCC) Active Problems:   Aneurysm of abdominal vessel (HCC)   Mixed hyperlipidemia   Coronary atherosclerosis of native coronary artery   PVD (peripheral vascular disease) (HCC)   Tobacco abuse   Right hemiparesis (HCC)   Primary adenocarcinoma of upper lobe of right lung (HCC)   Acute combined systolic and diastolic heart failure (HCC)  Assessment and Plan:   Symptomatic new onset atrial fibrillation with RVR Currently on metoprolol twice daily Eliquis started for given CHA2DS2-VASc score of 5 TSH 1.53 2D echocardiogram as noted below with reduced LV function Appreciate cardiology recommendations with plans for TEE/DCCV 8/31  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 LVEF 35-40% on echocardiogram this admission with prior normal LVEF Toprol 50 mg twice daily and Lasix currently held Possible tachycardia induced cardiomyopathy versus myocarditis from immunotherapy Cardiology to discuss further with cardio oncology, check troponins   AKI-resolved Creatinine currently 1.22 and baseline is near 1 Gentle IV fluid and avoid nephrotoxic agents Monitor strict I's and O's Follow labs in a.m.   History of CAD with prior MI Continue aspirin, statin, and metoprolol   Prior CVA/PVD Continue aspirin and statin   Chronic  anemia/thrombocytopenia Monitor carefully while on Eliquis   Hypertension Softer blood pressure readings noted, continue on metoprolol per cardiology and monitor   Dyslipidemia Continue statin   Stage II lung adenocarcinoma Therapy to continue in the outpatient setting once stabilized Follows with Dr. Delton Ward   Tobacco abuse Quit smoking approximately 5 weeks ago    DVT prophylaxis: Eliquis Code Status: Full Family Communication: Partner at bedside 8/29 Disposition Plan: TEE/DCCV per cardiology 8/31 Status is: Inpatient Remains inpatient appropriate because: Need for close monitoring and IV medications.  Consultants:  Cardiology Discussed with oncology  Procedures:  See below  Antimicrobials:  None   Subjective: Patient seen and evaluated today with no new acute complaints or concerns. No acute concerns or events noted overnight.  He continues to remain tachycardic.  Objective: Vitals:   09/17/21 2345 09/17/21 2346 09/18/21 0116 09/18/21 0515  BP: 108/67  94/68 95/61  Pulse:  (!) 123 91 66  Resp:   16 16  Temp: 98.7 F (37.1 C)  97.6 F (36.4 C) 98.1 F (36.7 C)  TempSrc: Oral  Oral Oral  SpO2: 99%  100% 99%  Weight:      Height:        Intake/Output Summary (Last 24 hours) at 09/18/2021 1306 Last data filed at 09/18/2021 1100 Gross per 24 hour  Intake 704.18 ml  Output --  Net 704.18 ml   Filed Weights   09/17/21 1146  Weight: 71.2 kg    Examination:  General exam: Appears calm and comfortable  Respiratory system: Clear to auscultation. Respiratory effort normal.  Nasal cannula Cardiovascular system: S1 & S2 heard, tachycardic and irregular Gastrointestinal system: Abdomen is soft Central nervous system: Alert  and awake Extremities: No edema Skin: No significant lesions noted Psychiatry: Flat affect.    Data Reviewed: I have personally reviewed following labs and imaging studies  CBC: Recent Labs  Lab 09/17/21 0917 09/17/21 1150  09/18/21 0820  WBC 4.5 4.6 5.0  NEUTROABS 1.9 2.2  --   HGB 10.2* 9.5* 8.9*  HCT 29.9* 28.0* 26.5*  MCV 91.4 92.1 93.3  PLT 137* 123* 010*   Basic Metabolic Panel: Recent Labs  Lab 09/17/21 0917 09/17/21 1150 09/18/21 0820  NA 142 139 140  K 3.4* 3.5 3.7  CL 106 105 109  CO2 25 25 26   GLUCOSE 110* 120* 90  BUN 19 18 17   CREATININE 1.51* 1.47* 1.22  CALCIUM 8.6* 8.2* 8.2*  MG 1.8 2.5* 2.0   GFR: Estimated Creatinine Clearance: 53.5 mL/min (by C-G formula based on SCr of 1.22 mg/dL). Liver Function Tests: Recent Labs  Lab 09/17/21 0917  AST 20  ALT 15  ALKPHOS 45  BILITOT 0.6  PROT 6.7  ALBUMIN 3.2*   No results for input(s): "LIPASE", "AMYLASE" in the last 168 hours. No results for input(s): "AMMONIA" in the last 168 hours. Coagulation Profile: Recent Labs  Lab 09/17/21 1158  INR 1.2   Cardiac Enzymes: No results for input(s): "CKTOTAL", "CKMB", "CKMBINDEX", "TROPONINI" in the last 168 hours. BNP (last 3 results) No results for input(s): "PROBNP" in the last 8760 hours. HbA1C: No results for input(s): "HGBA1C" in the last 72 hours. CBG: No results for input(s): "GLUCAP" in the last 168 hours. Lipid Profile: No results for input(s): "CHOL", "HDL", "LDLCALC", "TRIG", "CHOLHDL", "LDLDIRECT" in the last 72 hours. Thyroid Function Tests: Recent Labs    09/17/21 1023  TSH 1.534   Anemia Panel: No results for input(s): "VITAMINB12", "FOLATE", "FERRITIN", "TIBC", "IRON", "RETICCTPCT" in the last 72 hours. Sepsis Labs: No results for input(s): "PROCALCITON", "LATICACIDVEN" in the last 168 hours.  No results found for this or any previous visit (from the past 240 hour(s)).       Radiology Studies: ECHOCARDIOGRAM COMPLETE  Result Date: 09/17/2021    ECHOCARDIOGRAM REPORT   Patient Name:   Darryl Ward Date of Exam: 09/17/2021 Medical Rec #:  [id number]        Height:       70.0 in Accession #:    [id number]       Weight:       156.9 lb Date of Birth:   Mar 24, 1945         BSA:          1.883 m Patient Age:    51 years         BP:           114/75 mmHg Patient Gender: M                HR:           96 bpm. Exam Location:  [facility] Procedure: 2D Echo, Cardiac Doppler and Color Doppler Indications:     Atrial Fibrillation I48.91  History:         Patient has prior history of Echocardiogram examinations, most                  recent 02/05/2020. COPD and Stroke; Risk Factors:Hypertension                  and Dyslipidemia. Primary adenocarcinoma of upper lobe of right  lung ([place]), Tobacco abuse, NSTEMI (non-ST elevated myocardial                  infarction) ([place]) (From Hx).  Sonographer:     Alvino Chapel RCS Referring Phys:  [id number] Royanne Foots [facility] Diagnosing Phys: Candee Furbish MD IMPRESSIONS  1. Left ventricular ejection fraction, by estimation, is 35 to 40%. The left ventricle has moderately decreased function. The left ventricle demonstrates global hypokinesis. Left ventricular diastolic parameters are indeterminate.  2. Right ventricular systolic function is normal. The right ventricular size is normal. There is normal pulmonary artery systolic pressure. The estimated right ventricular systolic pressure is 60.7 mmHg.  3. Left atrial size was moderately dilated.  4. The mitral valve is normal in structure. Mild mitral valve regurgitation. No evidence of mitral stenosis.  5. Tricuspid valve regurgitation is moderate.  6. The aortic valve is normal in structure. Aortic valve regurgitation is not visualized. No aortic stenosis is present.  7. The inferior vena cava is dilated in size with >50% respiratory variability, suggesting right atrial pressure of 8 mmHg. Comparison(s): Prior images reviewed side by side. The left ventricular function is worsened. Prior EF 60%. FINDINGS  Left Ventricle: Left ventricular ejection fraction, by estimation, is 35 to 40%. The left ventricle has moderately decreased function. The left ventricle demonstrates global  hypokinesis. The left ventricular internal cavity size was normal in size. There is no left ventricular hypertrophy. Left ventricular diastolic parameters are indeterminate. Right Ventricle: The right ventricular size is normal. No increase in right ventricular wall thickness. Right ventricular systolic function is normal. There is normal pulmonary artery systolic pressure. The tricuspid regurgitant velocity is 2.30 m/s, and  with an assumed right atrial pressure of 8 mmHg, the estimated right ventricular systolic pressure is 37.1 mmHg. Left Atrium: Left atrial size was moderately dilated. Right Atrium: Right atrial size was normal in size. Pericardium: There is no evidence of pericardial effusion. Mitral Valve: The mitral valve is normal in structure. Mild mitral valve regurgitation. No evidence of mitral valve stenosis. MV peak gradient, 3.8 mmHg. The mean mitral valve gradient is 1.0 mmHg. Tricuspid Valve: The tricuspid valve is normal in structure. Tricuspid valve regurgitation is moderate . No evidence of tricuspid stenosis. Aortic Valve: The aortic valve is normal in structure. Aortic valve regurgitation is not visualized. No aortic stenosis is present. Pulmonic Valve: The pulmonic valve was normal in structure. Pulmonic valve regurgitation is not visualized. No evidence of pulmonic stenosis. Aorta: The aortic root is normal in size and structure. Venous: The inferior vena cava is dilated in size with greater than 50% respiratory variability, suggesting right atrial pressure of 8 mmHg. IAS/Shunts: No atrial level shunt detected by color flow Doppler.  LEFT VENTRICLE PLAX 2D LVIDd:         4.80 cm LVIDs:         3.70 cm LV PW:         0.90 cm LV IVS:        0.90 cm LVOT diam:     2.20 cm LV SV:         56 LV SV Index:   30 LVOT Area:     3.80 cm  RIGHT VENTRICLE TAPSE (M-mode): 1.6 cm LEFT ATRIUM             Index        RIGHT ATRIUM           Index LA diam:  4.20 cm 2.23 cm/m   RA Area:     18.00 cm  LA Vol (A2C):   90.3 ml 47.96 ml/m  RA Volume:   40.80 ml  21.67 ml/m LA Vol (A4C):   68.9 ml 36.60 ml/m LA Biplane Vol: 79.7 ml 42.33 ml/m  AORTIC VALVE LVOT Vmax:   72.50 cm/s LVOT Vmean:  49.000 cm/s LVOT VTI:    0.148 m  AORTA Ao Root diam: 4.00 cm MITRAL VALVE               TRICUSPID VALVE MV Area (PHT): 4.60 cm    TR Peak grad:   21.2 mmHg MV Area VTI:   2.20 cm    TR Vmax:        230.00 cm/s MV Peak grad:  3.8 mmHg MV Mean grad:  1.0 mmHg    SHUNTS MV Vmax:       0.97 m/s    Systemic VTI:  0.15 m MV Vmean:      40.0 cm/s   Systemic Diam: 2.20 cm MV Decel Time: 165 msec MV E velocity: 91.70 cm/s Candee Furbish MD Electronically signed by Candee Furbish MD Signature Date/Time: 09/17/2021/4:49:21 PM    Final (Updated)    DG Chest 2 View  Result Date: 09/17/2021 CLINICAL DATA:  Shortness of breath and dizziness. EXAM: CHEST - 2 VIEW COMPARISON:  Chest radiograph 06/11/2021 FINDINGS: A left chest wall port is in place with tip terminating at the cavoatrial junction. The cardiomediastinal silhouette is stable. There is no focal consolidation or pulmonary edema. There is no pleural effusion or pneumothorax. A fiducial marker is again seen in the right upper lobe with decreased surrounding density likely reflecting resolved post biopsy hemorrhage. There is no acute osseous abnormality. IMPRESSION: 1. Fiducial marker in place in the right upper lobe with decreased surrounding density likely reflecting resolved post biopsy hemorrhage. 2. No radiographic evidence of acute cardiopulmonary process. Electronically Signed   By: Valetta Mole M.D.   On: 09/17/2021 12:35        Scheduled Meds:  apixaban  5 mg Oral BID   atorvastatin  80 mg Oral Daily   Chlorhexidine Gluconate Cloth  6 each Topical Daily   folic acid  1 mg Oral Daily   metoprolol succinate  50 mg Oral BID   predniSONE  10 mg Oral Q breakfast   sodium chloride flush  3 mL Intravenous Q12H   Continuous Infusions:  sodium chloride       LOS:  1 day    Time spent: 35 minutes    [place]orro E[place] Darleen Crocker, DO [facility]  If 7PM-7AM, please contact night-coverage [email or web address] 09/18/2021, 1:06 PM

## 2021-09-18 NOTE — Progress Notes (Signed)
ANTICOAGULATION CONSULT NOTE - Pharmacy Consult for heparin=> eliquis Indication: atrial fibrillation  No Known Allergies  Patient Measurements: Height: 5\' 10"  (177.8 cm) Weight: 71.2 kg (156 lb 14.4 oz) IBW/kg (Calculated) : 73 Heparin Dosing Weight: 71.2 kg  Vital Signs: Temp: 98.1 F (36.7 C) (08/29 0515) Temp Source: Oral (08/29 0515) BP: 95/61 (08/29 0515) Pulse Rate: 66 (08/29 0515)  Labs: Recent Labs    09/17/21 0917 09/17/21 1150 09/17/21 1158 09/17/21 2201 09/18/21 0820  HGB 10.2* 9.5*  --   --  8.9*  HCT 29.9* 28.0*  --   --  26.5*  PLT 137* 123*  --   --  129*  APTT  --   --  25  --   --   LABPROT  --   --  14.8  --   --   INR  --   --  1.2  --   --   HEPARINUNFRC  --   --   --  0.89* 0.94*  CREATININE 1.51* 1.47*  --   --  1.22     Estimated Creatinine Clearance: 53.5 mL/min (by C-G formula based on SCr of 1.22 mg/dL).   Medical History: Past Medical History:  Diagnosis Date   AAA (abdominal aortic aneurysm) ([place])    Arthritis    Carotid artery disease ([place])    Nonobstructive   Cataract    Coronary atherosclerosis of native coronary artery    PTCA small diagonal 2007 otherwise nonobstructive CAD, EF 50-55%   Essential hypertension, benign    Hyperlipidemia    NSTEMI (non-ST elevated myocardial infarction) ([place]) 2007   Stroke [facility]) 2004   TIA (transient ischemic attack) 2006    Medications:  Medications Prior to Admission  Medication Sig Dispense Refill Last Dose   aspirin EC 81 MG tablet Take 1 tablet (81 mg total) by mouth daily. Okay to restart this medication on 06/12/2021 (Patient taking differently: Take 81 mg by mouth daily.) 90 tablet 3 09/16/2021   atorvastatin (LIPITOR) 80 MG tablet Take 80 mg by mouth daily.   09/16/2021   CARBOPLATIN IV Inject into the vein every 21 ( twenty-one) days.   08/27/2021   cyclobenzaprine (FLEXERIL) 10 MG tablet Take 10 mg by mouth 2 (two) times daily as needed for muscle spasms.   Past Week   folic acid  (FOLVITE) 1 MG tablet Take 1 tablet (1 mg total) by mouth daily. 90 tablet 2 09/16/2021   furosemide (LASIX) 20 MG tablet Take 1 tablet (20 mg total) by mouth daily as needed. (Patient taking differently: Take 20 mg by mouth every other day.) 30 tablet 2 09/16/2021   lidocaine-prilocaine (EMLA) cream Apply a small amount to port a cath site and cover with plastic wrap 1 hour prior to infusion appointments 30 g 3 Past Month   Nivolumab (OPDIVO IV) Inject into the vein.   08/27/2021   PEMETREXED IV Inject into the vein every 21 ( twenty-one) days.   08/27/2021   predniSONE (DELTASONE) 10 MG tablet Take 1 tablet (10 mg total) by mouth daily with breakfast. 30 tablet 0 08/27/2021   prochlorperazine (COMPAZINE) 10 MG tablet Take 1 tablet (10 mg total) by mouth every 6 (six) hours as needed (Nausea or vomiting). 60 tablet 3 08/28/2021   metoprolol succinate (TOPROL-XL) 25 MG 24 hr tablet Take 1 tablet (25 mg total) by mouth daily. (Patient not taking: Reported on 09/17/2021) 90 tablet 3 Not Taking    Assessment: Pharmacy consulted to dose heparin in  patient with atrial fibrillation.  Patient is not on anticoagulation prior to admission.    Heparin transitioning to eliquis for afib  Goal of Therapy:  Heparin level 0.3-0.7 units/ml Monitor platelets by anticoagulation protocol: Yes   Plan:  D/C heparin Eliquis 5mg  po bid Educate on eliquis Continue to monitor H&H and platelets  Isac Sarna, BS Pharm D, BCPS Clinical Pharmacist 09/18/2021 11:26 AM

## 2021-09-18 NOTE — Progress Notes (Signed)
Pt in afib with rates from 110's -140 with pressure soft at 108/67. He denies symptoms. Metoprolol 50mg  PO given at 2123.Pt was on cardizem drip earlier today. This RN reached out to Dr. Josephine Cables to inform him. He advises to monitor patient for now since rate is not sustained. Darryl Ward

## 2021-09-18 NOTE — Progress Notes (Signed)
ANTICOAGULATION CONSULT NOTE - Pharmacy Consult for heparin Indication: atrial fibrillation  No Known Allergies  Patient Measurements: Height: 5\' 10"  (177.8 cm) Weight: 71.2 kg (156 lb 14.4 oz) IBW/kg (Calculated) : 73 Heparin Dosing Weight: 71.2 kg  Vital Signs: Temp: 98.1 F (36.7 C) (08/29 0515) Temp Source: Oral (08/29 0515) BP: 95/61 (08/29 0515) Pulse Rate: 66 (08/29 0515)  Labs: Recent Labs    09/17/21 0917 09/17/21 1150 09/17/21 1158 09/17/21 2201 09/18/21 0820  HGB 10.2* 9.5*  --   --  8.9*  HCT 29.9* 28.0*  --   --  26.5*  PLT 137* 123*  --   --  129*  APTT  --   --  25  --   --   LABPROT  --   --  14.8  --   --   INR  --   --  1.2  --   --   HEPARINUNFRC  --   --   --  0.89* 0.94*  CREATININE 1.51* 1.47*  --   --  1.22     Estimated Creatinine Clearance: 53.5 mL/min (by C-G formula based on SCr of 1.22 mg/dL).   Medical History: Past Medical History:  Diagnosis Date   AAA (abdominal aortic aneurysm) ([place])    Arthritis    Carotid artery disease ([place])    Nonobstructive   Cataract    Coronary atherosclerosis of native coronary artery    PTCA small diagonal 2007 otherwise nonobstructive CAD, EF 50-55%   Essential hypertension, benign    Hyperlipidemia    NSTEMI (non-ST elevated myocardial infarction) ([place]) 2007   Stroke [facility]) 2004   TIA (transient ischemic attack) 2006    Medications:  Medications Prior to Admission  Medication Sig Dispense Refill Last Dose   aspirin EC 81 MG tablet Take 1 tablet (81 mg total) by mouth daily. Okay to restart this medication on 06/12/2021 (Patient taking differently: Take 81 mg by mouth daily.) 90 tablet 3 09/16/2021   atorvastatin (LIPITOR) 80 MG tablet Take 80 mg by mouth daily.   09/16/2021   CARBOPLATIN IV Inject into the vein every 21 ( twenty-one) days.   08/27/2021   cyclobenzaprine (FLEXERIL) 10 MG tablet Take 10 mg by mouth 2 (two) times daily as needed for muscle spasms.   Past Week   folic acid (FOLVITE) 1  MG tablet Take 1 tablet (1 mg total) by mouth daily. 90 tablet 2 09/16/2021   furosemide (LASIX) 20 MG tablet Take 1 tablet (20 mg total) by mouth daily as needed. (Patient taking differently: Take 20 mg by mouth every other day.) 30 tablet 2 09/16/2021   lidocaine-prilocaine (EMLA) cream Apply a small amount to port a cath site and cover with plastic wrap 1 hour prior to infusion appointments 30 g 3 Past Month   Nivolumab (OPDIVO IV) Inject into the vein.   08/27/2021   PEMETREXED IV Inject into the vein every 21 ( twenty-one) days.   08/27/2021   predniSONE (DELTASONE) 10 MG tablet Take 1 tablet (10 mg total) by mouth daily with breakfast. 30 tablet 0 08/27/2021   prochlorperazine (COMPAZINE) 10 MG tablet Take 1 tablet (10 mg total) by mouth every 6 (six) hours as needed (Nausea or vomiting). 60 tablet 3 08/28/2021   metoprolol succinate (TOPROL-XL) 25 MG 24 hr tablet Take 1 tablet (25 mg total) by mouth daily. (Patient not taking: Reported on 09/17/2021) 90 tablet 3 Not Taking    Assessment: Pharmacy consulted to dose heparin in patient  with atrial fibrillation.  Patient is not on anticoagulation prior to admission.    HL is 0.94, supratherapeutic. Rate adjusted this AM, will adjust further.   Goal of Therapy:  Heparin level 0.3-0.7 units/ml Monitor platelets by anticoagulation protocol: Yes   Plan:  Decrease heparin infusion at 700 units/hr Check anti-Xa level in 8 hours and daily while on heparin Continue to monitor H&H and platelets  Isac Sarna, BS Pharm D, BCPS Clinical Pharmacist 09/18/2021 10:41 AM

## 2021-09-19 ENCOUNTER — Other Ambulatory Visit (HOSPITAL_COMMUNITY): Payer: Medicare Other

## 2021-09-19 DIAGNOSIS — I4891 Unspecified atrial fibrillation: Secondary | ICD-10-CM | POA: Diagnosis not present

## 2021-09-19 DIAGNOSIS — I5041 Acute combined systolic (congestive) and diastolic (congestive) heart failure: Secondary | ICD-10-CM | POA: Diagnosis not present

## 2021-09-19 DIAGNOSIS — R531 Weakness: Secondary | ICD-10-CM | POA: Diagnosis not present

## 2021-09-19 LAB — CBC
HCT: 26.4 % — ABNORMAL LOW (ref 39.0–52.0)
Hemoglobin: 8.9 g/dL — ABNORMAL LOW (ref 13.0–17.0)
MCH: 31.2 pg (ref 26.0–34.0)
MCHC: 33.7 g/dL (ref 30.0–36.0)
MCV: 92.6 fL (ref 80.0–100.0)
Platelets: 142 10*3/uL — ABNORMAL LOW (ref 150–400)
RBC: 2.85 MIL/uL — ABNORMAL LOW (ref 4.22–5.81)
RDW: 15 % (ref 11.5–15.5)
WBC: 5 10*3/uL (ref 4.0–10.5)
nRBC: 0 % (ref 0.0–0.2)

## 2021-09-19 LAB — BASIC METABOLIC PANEL
Anion gap: 5 (ref 5–15)
BUN: 15 mg/dL (ref 8–23)
CO2: 24 mmol/L (ref 22–32)
Calcium: 8.5 mg/dL — ABNORMAL LOW (ref 8.9–10.3)
Chloride: 110 mmol/L (ref 98–111)
Creatinine, Ser: 1.11 mg/dL (ref 0.61–1.24)
GFR, Estimated: >60 mL/min (ref >60)
Glucose, Bld: 95 mg/dL (ref 70–99)
Potassium: 4 mmol/L (ref 3.5–5.1)
Sodium: 139 mmol/L (ref 135–145)

## 2021-09-19 LAB — MAGNESIUM: Magnesium: 2 mg/dL (ref 1.7–2.4)

## 2021-09-19 NOTE — Discharge Instructions (Addendum)
Information on my medicine - ELIQUIS (apixaban)  This medication education was reviewed with me or my healthcare representative as part of my discharge preparation.    Why was Eliquis prescribed for you? Eliquis was prescribed for you to reduce the risk of a blood clot forming that can cause a stroke if you have a medical condition called atrial fibrillation (a type of irregular heartbeat).  What do You need to know about Eliquis ? Take your Eliquis TWICE DAILY - one tablet in the morning and one tablet in the evening with or without food. If you have difficulty swallowing the tablet whole please discuss with your pharmacist how to take the medication safely.  Take Eliquis exactly as prescribed by your doctor and DO NOT stop taking Eliquis without talking to the doctor who prescribed the medication.  Stopping may increase your risk of developing a stroke.  Refill your prescription before you run out.  After discharge, you should have regular check-up appointments with your healthcare provider that is prescribing your Eliquis.  In the future your dose may need to be changed if your kidney function or weight changes by a significant amount or as you get older.  What do you do if you miss a dose? If you miss a dose, take it as soon as you remember on the same day and resume taking twice daily.  Do not take more than one dose of ELIQUIS at the same time to make up a missed dose.  Important Safety Information A possible side effect of Eliquis is bleeding. You should call your healthcare provider right away if you experience any of the following: Bleeding from an injury or your nose that does not stop. Unusual colored urine (red or dark brown) or unusual colored stools (red or black). Unusual bruising for unknown reasons. A serious fall or if you hit your head (even if there is no bleeding).  Some medicines may interact with Eliquis and might increase your risk of bleeding or clotting  while on Eliquis. To help avoid this, consult your healthcare provider or pharmacist prior to using any new prescription or non-prescription medications, including herbals, vitamins, non-steroidal anti-inflammatory drugs (NSAIDs) and supplements.  This website has more information on Eliquis (apixaban): http://[email or web address].com/eliquis/home      IMPORTANT INFORMATION: PAY CLOSE ATTENTION   PHYSICIAN DISCHARGE INSTRUCTIONS  Follow with Primary care provider  [name], NP  and other consultants as instructed by your Hospitalist Physician  [place] IF SYMPTOMS COME BACK, WORSEN OR NEW PROBLEM DEVELOPS   Please note: You were cared for by a hospitalist during your hospital stay. Every effort will be made to forward records to your primary care provider.  You can request that your primary care provider send for your hospital records if they have not received them.  Once you are discharged, your primary care physician will handle any further medical issues. Please note that NO REFILLS for any discharge medications will be authorized once you are discharged, as it is imperative that you return to your primary care physician (or establish a relationship with a primary care physician if you do not have one) for your post hospital discharge needs so that they can reassess your need for medications and monitor your lab values.  Please get a complete blood count and chemistry panel checked by your Primary MD at your next visit, and again as instructed by your Primary MD.  Get Medicines reviewed and adjusted: Please take  all your medications with you for your next visit with your Primary MD  Laboratory/radiological data: Please request your Primary MD to go over all hospital tests and procedure/radiological results at the follow up, please ask your primary care provider to get all Hospital records sent to his/her office.  In some cases, they will be blood  work, cultures and biopsy results pending at the time of your discharge. Please request that your primary care provider follow up on these results.  If you are diabetic, please bring your blood sugar readings with you to your follow up appointment with primary care.    Please call and make your follow up appointments as soon as possible.    Also Note the following: If you experience worsening of your admission symptoms, develop shortness of breath, life threatening emergency, suicidal or homicidal thoughts you must seek medical attention immediately by calling 911 or calling your MD immediately  if symptoms less severe.  You must read complete instructions/literature along with all the possible adverse reactions/side effects for all the Medicines you take and that have been prescribed to you. Take any new Medicines after you have completely understood and accpet all the possible adverse reactions/side effects.   Do not drive when taking Pain medications or sleeping medications (Benzodiazepines)  Do not take more than prescribed Pain, Sleep and Anxiety Medications. It is not advisable to combine anxiety,sleep and pain medications without talking with your primary care practitioner  Special Instructions: If you have smoked or chewed Tobacco  in the last 2 yrs please stop smoking, stop any regular Alcohol  and or any Recreational drug use.  Wear Seat belts while driving.  Do not drive if taking any narcotic, mind altering or controlled substances or recreational drugs or alcohol.

## 2021-09-19 NOTE — Progress Notes (Signed)
Rounding Note    Patient Name: Darryl Ward Date of Encounter: 09/19/2021  [place] Cardiologist: Minus Breeding, MD   Subjective   No chest pain or palpitations. Breathing at baseline. Reviewed TEE/DCCV with the patient and his wife who was at the bedside. Tentatively scheduled for 8/31.  Inpatient Medications    Scheduled Meds:  apixaban  5 mg Oral BID   atorvastatin  80 mg Oral Daily   Chlorhexidine Gluconate Cloth  6 each Topical Daily   folic acid  1 mg Oral Daily   metoprolol succinate  50 mg Oral BID   predniSONE  10 mg Oral Q breakfast   sodium chloride flush  3 mL Intravenous Q12H   Continuous Infusions:  sodium chloride     PRN Meds: sodium chloride, acetaminophen **OR** acetaminophen, cyclobenzaprine, ondansetron **OR** ondansetron (ZOFRAN) IV, sodium chloride flush   Vital Signs    Vitals:   09/18/21 0515 09/18/21 1300 09/18/21 2148 09/19/21 0551  BP: 95/61 (!) 104/91 100/70 97/62  Pulse: 66 100 100 63  Resp: 16  17 20   Temp: 98.1 F (36.7 C)  98 F (36.7 C) 98.4 F (36.9 C)  TempSrc: Oral  Oral   SpO2: 99% 99% 99% 99%  Weight:      Height:        Intake/Output Summary (Last 24 hours) at 09/19/2021 0958 Last data filed at 09/18/2021 1836 Gross per 24 hour  Intake 720 ml  Output --  Net 720 ml      09/17/2021   11:46 AM 09/17/2021    9:01 AM 08/27/2021    9:53 AM  Last 3 Weights  Weight (lbs) 156 lb 14.4 oz 156 lb 14.4 oz 164 lb 6.4 oz  Weight (kg) 71.169 kg 71.169 kg 74.571 kg      Telemetry    Atrial fibrillation, HR in 110's to 120's.  - Personally Reviewed  ECG    No new tracings.   Physical Exam   GEN: Pleasant male appearing in no acute distress.   Neck: No JVD Cardiac: Irregularly irregular, tachycardiac. No rubs or gallops.  Respiratory: Clear to auscultation bilaterally. GI: Soft, nontender, non-distended  MS: Trace ankle edema bilaterally; No deformity. Neuro:  Nonfocal  Psych: Normal affect   Labs     High Sensitivity Troponin:   Recent Labs  Lab 09/18/21 1337  TROPONINIHS 10     Chemistry Recent Labs  Lab 09/17/21 0917 09/17/21 1150 09/18/21 0820 09/19/21 0410  NA 142 139 140 139  K 3.4* 3.5 3.7 4.0  CL 106 105 109 110  CO2 25 25 26 24   GLUCOSE 110* 120* 90 95  BUN 19 18 17 15   CREATININE 1.51* 1.47* 1.22 1.11  CALCIUM 8.6* 8.2* 8.2* 8.5*  MG 1.8 2.5* 2.0 2.0  PROT 6.7  --   --   --   ALBUMIN 3.2*  --   --   --   AST 20  --   --   --   ALT 15  --   --   --   ALKPHOS 45  --   --   --   BILITOT 0.6  --   --   --   GFRNONAA 48* 50* >60 >60  ANIONGAP 11 9 5 5     Lipids No results for input(s): "CHOL", "TRIG", "HDL", "LABVLDL", "LDLCALC", "CHOLHDL" in the last 168 hours.  Hematology Recent Labs  Lab 09/17/21 1150 09/18/21 0820 09/19/21 0410  WBC 4.6 5.0 5.0  RBC 3.04* 2.84* 2.85*  HGB 9.5* 8.9* 8.9*  HCT 28.0* 26.5* 26.4*  MCV 92.1 93.3 92.6  MCH 31.3 31.3 31.2  MCHC 33.9 33.6 33.7  RDW 13.9 14.9 15.0  PLT 123* 129* 142*   Thyroid  Recent Labs  Lab 09/17/21 1023  TSH 1.534    BNPNo results for input(s): "BNP", "PROBNP" in the last 168 hours.  DDimer No results for input(s): "DDIMER" in the last 168 hours.   Radiology    DG Chest 2 View  Result Date: 09/17/2021 CLINICAL DATA:  Shortness of breath and dizziness. EXAM: CHEST - 2 VIEW COMPARISON:  Chest radiograph 06/11/2021 FINDINGS: A left chest wall port is in place with tip terminating at the cavoatrial junction. The cardiomediastinal silhouette is stable. There is no focal consolidation or pulmonary edema. There is no pleural effusion or pneumothorax. A fiducial marker is again seen in the right upper lobe with decreased surrounding density likely reflecting resolved post biopsy hemorrhage. There is no acute osseous abnormality. IMPRESSION: 1. Fiducial marker in place in the right upper lobe with decreased surrounding density likely reflecting resolved post biopsy hemorrhage. 2. No radiographic  evidence of acute cardiopulmonary process. Electronically Signed   By: Valetta Mole M.D.   On: 09/17/2021 12:35    Cardiac Studies   Echocardiogram: 09/17/2021 IMPRESSIONS     1. Left ventricular ejection fraction, by estimation, is 35 to 40%. The  left ventricle has moderately decreased function. The left ventricle  demonstrates global hypokinesis. Left ventricular diastolic parameters are  indeterminate.   2. Right ventricular systolic function is normal. The right ventricular  size is normal. There is normal pulmonary artery systolic pressure. The  estimated right ventricular systolic pressure is 40.9 mmHg.   3. Left atrial size was moderately dilated.   4. The mitral valve is normal in structure. Mild mitral valve  regurgitation. No evidence of mitral stenosis.   5. Tricuspid valve regurgitation is moderate.   6. The aortic valve is normal in structure. Aortic valve regurgitation is  not visualized. No aortic stenosis is present.   7. The inferior vena cava is dilated in size with >50% respiratory  variability, suggesting right atrial pressure of 8 mmHg.   Comparison(s): Prior images reviewed side by side. The left ventricular  function is worsened. Prior EF 60%.   Patient Profile     76 y.o. 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    Assessment & Plan    1. New-Onset Atrial Fibrillation with RVR - Rates were initially in the 140's and have improved but still remain elevated in the 110's to 130's at rest. Currently receiving Toprol-XL 50mg  BID and BP does not allow for additional medication titration at this time (BP at 97/62 on most recent check). TEE/DCCV tentatively scheduled for tomorrow at 0915 but echo was unaware of this procedure. Will follow-up with them to see  if the timing needs to be adjusted.  - This patients CHA2DS2-VASc Score and unadjusted Ischemic Stroke Rate (% per year) is equal to 7.2 % stroke rate/year from a score of 5. Previously reviewed with Hematology and cleared to switch to Eliquis. Hgb stable at 8.9 today with platelets at 142 K.   2. HFrEF - EF previously normal by echo in 2022 but reduced to 35-40%  by repeat imaging this admission. Likely tachycardia-mediated but could also be related to [place]. Will review with Dr. Phineas Inches. Also receiving Carboplatin and Pemetrexed. - Currently receiving Toprol-XL 50mg  BID. If DCCV successful, can perhaps reduce Toprol-XL dosing and add low-dose Spironolactone or ARB. Doubt BP will allow for Entresto. Can consider an SGLT2 inhibitor prior to discharge given improvement in renal function.   3. CAD - History of PTCA to D1 in 2007 with no recent interventions. Hs Troponin negative this admission. Echo does show his EF is reduced and would plan for repeat imaging in 3 months for reassessment since likely a NICM. If EF remains reduced, would need to arrange for ischemic evaluation.  - Remains on statin and beta-blocker therapy. ASA has been stopped given the need for anticoagulation.   4. AKI - Creatinine peaked at 1.51. Now improved to 1.11.  5. Stage 2 Lung Cancer - Being followed by Oncology as an outpatient.   For questions or updates, please contact [place] Please consult [email or web address] for contact info under        Signed, Erma Heritage, PA-C  09/19/2021, 9:58 AM

## 2021-09-19 NOTE — H&P (View-Only) (Signed)
Rounding Note    Patient Name: Darryl Ward Date of Encounter: 09/19/2021  [place] Cardiologist: Minus Breeding, MD   Subjective   No chest pain or palpitations. Breathing at baseline. Reviewed TEE/DCCV with the patient and his wife who was at the bedside. Tentatively scheduled for 8/31.  Inpatient Medications    Scheduled Meds:  apixaban  5 mg Oral BID   atorvastatin  80 mg Oral Daily   Chlorhexidine Gluconate Cloth  6 each Topical Daily   folic acid  1 mg Oral Daily   metoprolol succinate  50 mg Oral BID   predniSONE  10 mg Oral Q breakfast   sodium chloride flush  3 mL Intravenous Q12H   Continuous Infusions:  sodium chloride     PRN Meds: sodium chloride, acetaminophen **OR** acetaminophen, cyclobenzaprine, ondansetron **OR** ondansetron (ZOFRAN) IV, sodium chloride flush   Vital Signs    Vitals:   09/18/21 0515 09/18/21 1300 09/18/21 2148 09/19/21 0551  BP: 95/61 (!) 104/91 100/70 97/62  Pulse: 66 100 100 63  Resp: 16  17 20   Temp: 98.1 F (36.7 C)  98 F (36.7 C) 98.4 F (36.9 C)  TempSrc: Oral  Oral   SpO2: 99% 99% 99% 99%  Weight:      Height:        Intake/Output Summary (Last 24 hours) at 09/19/2021 0958 Last data filed at 09/18/2021 1836 Gross per 24 hour  Intake 720 ml  Output --  Net 720 ml      09/17/2021   11:46 AM 09/17/2021    9:01 AM 08/27/2021    9:53 AM  Last 3 Weights  Weight (lbs) 156 lb 14.4 oz 156 lb 14.4 oz 164 lb 6.4 oz  Weight (kg) 71.169 kg 71.169 kg 74.571 kg      Telemetry    Atrial fibrillation, HR in 110's to 120's.  - Personally Reviewed  ECG    No new tracings.   Physical Exam   GEN: Pleasant male appearing in no acute distress.   Neck: No JVD Cardiac: Irregularly irregular, tachycardiac. No rubs or gallops.  Respiratory: Clear to auscultation bilaterally. GI: Soft, nontender, non-distended  MS: Trace ankle edema bilaterally; No deformity. Neuro:  Nonfocal  Psych: Normal affect   Labs     High Sensitivity Troponin:   Recent Labs  Lab 09/18/21 1337  TROPONINIHS 10     Chemistry Recent Labs  Lab 09/17/21 0917 09/17/21 1150 09/18/21 0820 09/19/21 0410  NA 142 139 140 139  K 3.4* 3.5 3.7 4.0  CL 106 105 109 110  CO2 25 25 26 24   GLUCOSE 110* 120* 90 95  BUN 19 18 17 15   CREATININE 1.51* 1.47* 1.22 1.11  CALCIUM 8.6* 8.2* 8.2* 8.5*  MG 1.8 2.5* 2.0 2.0  PROT 6.7  --   --   --   ALBUMIN 3.2*  --   --   --   AST 20  --   --   --   ALT 15  --   --   --   ALKPHOS 45  --   --   --   BILITOT 0.6  --   --   --   GFRNONAA 48* 50* >60 >60  ANIONGAP 11 9 5 5     Lipids No results for input(s): "CHOL", "TRIG", "HDL", "LABVLDL", "LDLCALC", "CHOLHDL" in the last 168 hours.  Hematology Recent Labs  Lab 09/17/21 1150 09/18/21 0820 09/19/21 0410  WBC 4.6 5.0 5.0  RBC 3.04* 2.84* 2.85*  HGB 9.5* 8.9* 8.9*  HCT 28.0* 26.5* 26.4*  MCV 92.1 93.3 92.6  MCH 31.3 31.3 31.2  MCHC 33.9 33.6 33.7  RDW 13.9 14.9 15.0  PLT 123* 129* 142*   Thyroid  Recent Labs  Lab 09/17/21 1023  TSH 1.534    BNPNo results for input(s): "BNP", "PROBNP" in the last 168 hours.  DDimer No results for input(s): "DDIMER" in the last 168 hours.   Radiology    DG Chest 2 View  Result Date: 09/17/2021 CLINICAL DATA:  Shortness of breath and dizziness. EXAM: CHEST - 2 VIEW COMPARISON:  Chest radiograph 06/11/2021 FINDINGS: A left chest wall port is in place with tip terminating at the cavoatrial junction. The cardiomediastinal silhouette is stable. There is no focal consolidation or pulmonary edema. There is no pleural effusion or pneumothorax. A fiducial marker is again seen in the right upper lobe with decreased surrounding density likely reflecting resolved post biopsy hemorrhage. There is no acute osseous abnormality. IMPRESSION: 1. Fiducial marker in place in the right upper lobe with decreased surrounding density likely reflecting resolved post biopsy hemorrhage. 2. No radiographic  evidence of acute cardiopulmonary process. Electronically Signed   By: Valetta Mole M.D.   On: 09/17/2021 12:35    Cardiac Studies   Echocardiogram: 09/17/2021 IMPRESSIONS     1. Left ventricular ejection fraction, by estimation, is 35 to 40%. The  left ventricle has moderately decreased function. The left ventricle  demonstrates global hypokinesis. Left ventricular diastolic parameters are  indeterminate.   2. Right ventricular systolic function is normal. The right ventricular  size is normal. There is normal pulmonary artery systolic pressure. The  estimated right ventricular systolic pressure is 92.0 mmHg.   3. Left atrial size was moderately dilated.   4. The mitral valve is normal in structure. Mild mitral valve  regurgitation. No evidence of mitral stenosis.   5. Tricuspid valve regurgitation is moderate.   6. The aortic valve is normal in structure. Aortic valve regurgitation is  not visualized. No aortic stenosis is present.   7. The inferior vena cava is dilated in size with >50% respiratory  variability, suggesting right atrial pressure of 8 mmHg.   Comparison(s): Prior images reviewed side by side. The left ventricular  function is worsened. Prior EF 60%.   Patient Profile     76 y.o. 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    Assessment & Plan    1. New-Onset Atrial Fibrillation with RVR - Rates were initially in the 140's and have improved but still remain elevated in the 110's to 130's at rest. Currently receiving Toprol-XL 50mg  BID and BP does not allow for additional medication titration at this time (BP at 97/62 on most recent check). TEE/DCCV tentatively scheduled for tomorrow at 0915 but echo was unaware of this procedure. Will follow-up with them to see  if the timing needs to be adjusted.  - This patients CHA2DS2-VASc Score and unadjusted Ischemic Stroke Rate (% per year) is equal to 7.2 % stroke rate/year from a score of 5. Previously reviewed with Hematology and cleared to switch to Eliquis. Hgb stable at 8.9 today with platelets at 142 K.   2. HFrEF - EF previously normal by echo in 2022 but reduced to 35-40%  by repeat imaging this admission. Likely tachycardia-mediated but could also be related to [place]. Will review with Dr. Phineas Inches. Also receiving Carboplatin and Pemetrexed. - Currently receiving Toprol-XL 50mg  BID. If DCCV successful, can perhaps reduce Toprol-XL dosing and add low-dose Spironolactone or ARB. Doubt BP will allow for Entresto. Can consider an SGLT2 inhibitor prior to discharge given improvement in renal function.   3. CAD - History of PTCA to D1 in 2007 with no recent interventions. Hs Troponin negative this admission. Echo does show his EF is reduced and would plan for repeat imaging in 3 months for reassessment since likely a NICM. If EF remains reduced, would need to arrange for ischemic evaluation.  - Remains on statin and beta-blocker therapy. ASA has been stopped given the need for anticoagulation.   4. AKI - Creatinine peaked at 1.51. Now improved to 1.11.  5. Stage 2 Lung Cancer - Being followed by Oncology as an outpatient.   For questions or updates, please contact [place] Please consult [email or web address] for contact info under        Signed, Erma Heritage, PA-C  09/19/2021, 9:58 AM

## 2021-09-19 NOTE — Progress Notes (Signed)
PROGRESS NOTE    Darryl Ward  QMG:[id number] DOB: February 21, 1945 DOA: 09/17/2021 PCP: Darryl Sill, NP   Brief Narrative:    Darryl Ward is a 76 y.o. male with medical history significant for CAD, PVD, CVA, hypertension, dyslipidemia, and stage II lung adenocarcinoma who presented from the cancer center on account of noted elevated heart rates.  He was admitted with symptomatic new onset atrial fibrillation with RVR and now has new LV dysfunction in the setting of recent chemo/immunotherapy.  Cardiology following with plans for likely TEE/DCCV on 8/31.  Assessment & Plan:   Principal Problem:   Atrial fibrillation with RVR (HCC) Active Problems:   Aneurysm of abdominal vessel (HCC)   Mixed hyperlipidemia   Coronary atherosclerosis of native coronary artery   PVD (peripheral vascular disease) (HCC)   Tobacco abuse   Right hemiparesis (HCC)   Primary adenocarcinoma of upper lobe of right lung (HCC)   Acute combined systolic and diastolic heart failure (HCC)  Assessment and Plan:   Symptomatic new onset atrial fibrillation with RVR Rate controlled on metoprolol twice daily Eliquis started for given CHA2DS2-VASc score of 5 TSH 1.53 2D echocardiogram as noted below with reduced LV function Appreciate cardiology recommendations with plans for TEE/DCCV 8/31  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 LVEF 35-40% on echocardiogram this admission with prior normal LVEF Toprol 50 mg twice daily and Lasix currently held suspecting tachycardia induced cardiomyopathy versus myocarditis from immunotherapy Cardiology following closely   AKI-resolved Creatinine currently 1.22 and baseline is near 1 Gentle IV fluid and avoid nephrotoxic agents Monitor strict I's and O's Follow labs   History of CAD with prior MI Continue aspirin, statin, and metoprolol   Prior CVA/PVD Continue aspirin and statin   Chronic anemia/thrombocytopenia Monitor carefully while on  Eliquis   Hypertension Softer blood pressure readings noted, continue on metoprolol per cardiology and monitor   Dyslipidemia Continue statin   Stage II lung adenocarcinoma Therapy to continue in the outpatient setting once stabilized Follows with Darryl Ward   Tobacco abuse Quit smoking approximately 5 weeks ago    DVT prophylaxis: apixaban Code Status: Full Family Communication:  Disposition Plan: TEE/DCCV per cardiology 8/31 Status is: Inpatient Remains inpatient appropriate because: Need for close monitoring and IV medications.  Consultants:  Cardiology Discussed with oncology  Procedures:  See below  Antimicrobials:  None  Subjective: Denies any specific complaints today  Objective: Vitals:   09/18/21 0515 09/18/21 1300 09/18/21 2148 09/19/21 0551  BP: 95/61 (!) 104/91 100/70 97/62  Pulse: 66 100 100 63  Resp: 16  17 20   Temp: 98.1 F (36.7 C)  98 F (36.7 C) 98.4 F (36.9 C)  TempSrc: Oral  Oral   SpO2: 99% 99% 99% 99%  Weight:      Height:        Intake/Output Summary (Last 24 hours) at 09/19/2021 1356 Last data filed at 09/18/2021 1836 Gross per 24 hour  Intake 240 ml  Output --  Net 240 ml   Filed Weights   09/17/21 1146  Weight: 71.2 kg    Examination:  General exam: Appears calm and comfortable chronically ill appearing.  Respiratory system: Clear to auscultation. Respiratory effort normal.  Nasal cannula Cardiovascular system: S1 & S2 normal, irregularly irregular Gastrointestinal system: Abdomen is soft, normal BS, ND/NT, no HSM Central nervous system: Alert and awake Extremities: No edema.  Skin: No significant lesions noted Psychiatry: Flat affect.  Data Reviewed: I have personally reviewed following labs and  imaging studies  CBC: Recent Labs  Lab 09/17/21 0917 09/17/21 1150 09/18/21 0820 09/19/21 0410  WBC 4.5 4.6 5.0 5.0  NEUTROABS 1.9 2.2  --   --   HGB 10.2* 9.5* 8.9* 8.9*  HCT 29.9* 28.0* 26.5* 26.4*  MCV  91.4 92.1 93.3 92.6  PLT 137* 123* 129* 353*   Basic Metabolic Panel: Recent Labs  Lab 09/17/21 0917 09/17/21 1150 09/18/21 0820 09/19/21 0410  NA 142 139 140 139  K 3.4* 3.5 3.7 4.0  CL 106 105 109 110  CO2 25 25 26 24   GLUCOSE 110* 120* 90 95  BUN 19 18 17 15   CREATININE 1.51* 1.47* 1.22 1.11  CALCIUM 8.6* 8.2* 8.2* 8.5*  MG 1.8 2.5* 2.0 2.0   GFR: Estimated Creatinine Clearance: 58.8 mL/min (by C-G formula based on SCr of 1.11 mg/dL). Liver Function Tests: Recent Labs  Lab 09/17/21 0917  AST 20  ALT 15  ALKPHOS 45  BILITOT 0.6  PROT 6.7  ALBUMIN 3.2*   No results for input(s): "LIPASE", "AMYLASE" in the last 168 hours. No results for input(s): "AMMONIA" in the last 168 hours. Coagulation Profile: Recent Labs  Lab 09/17/21 1158  INR 1.2   Cardiac Enzymes: No results for input(s): "CKTOTAL", "CKMB", "CKMBINDEX", "TROPONINI" in the last 168 hours. BNP (last 3 results) No results for input(s): "PROBNP" in the last 8760 hours. HbA1C: No results for input(s): "HGBA1C" in the last 72 hours. CBG: No results for input(s): "GLUCAP" in the last 168 hours. Lipid Profile: No results for input(s): "CHOL", "HDL", "LDLCALC", "TRIG", "CHOLHDL", "LDLDIRECT" in the last 72 hours. Thyroid Function Tests: Recent Labs    09/17/21 1023  TSH 1.534   Anemia Panel: No results for input(s): "VITAMINB12", "FOLATE", "FERRITIN", "TIBC", "IRON", "RETICCTPCT" in the last 72 hours. Sepsis Labs: No results for input(s): "PROCALCITON", "LATICACIDVEN" in the last 168 hours.  No results found for this or any previous visit (from the past 240 hour(s)).    Radiology Studies: ECHOCARDIOGRAM COMPLETE  Result Date: 09/17/2021    ECHOCARDIOGRAM REPORT   Patient Name:   Darryl Ward Date of Exam: 09/17/2021 Medical Rec #:  [id number]        Height:       70.0 in Accession #:    [id number]       Weight:       156.9 lb Date of Birth:  08-Jan-1946         BSA:          1.883 m Patient Age:     72 years         BP:           114/75 mmHg Patient Gender: M                HR:           96 bpm. Exam Location:  [facility] Procedure: 2D Echo, Cardiac Doppler and Color Doppler Indications:     Atrial Fibrillation I48.91  History:         Patient has prior history of Echocardiogram examinations, most                  recent 02/05/2020. COPD and Stroke; Risk Factors:Hypertension                  and Dyslipidemia. Primary adenocarcinoma of upper lobe of right                  lung ([place]),  Tobacco abuse, NSTEMI (non-ST elevated myocardial                  infarction) ([place]) (From Hx).  Sonographer:     Alvino Chapel RCS Referring Phys:  [id number] Royanne Foots [facility] Diagnosing Phys: Candee Furbish MD IMPRESSIONS  1. Left ventricular ejection fraction, by estimation, is 35 to 40%. The left ventricle has moderately decreased function. The left ventricle demonstrates global hypokinesis. Left ventricular diastolic parameters are indeterminate.  2. Right ventricular systolic function is normal. The right ventricular size is normal. There is normal pulmonary artery systolic pressure. The estimated right ventricular systolic pressure is 67.6 mmHg.  3. Left atrial size was moderately dilated.  4. The mitral valve is normal in structure. Mild mitral valve regurgitation. No evidence of mitral stenosis.  5. Tricuspid valve regurgitation is moderate.  6. The aortic valve is normal in structure. Aortic valve regurgitation is not visualized. No aortic stenosis is present.  7. The inferior vena cava is dilated in size with >50% respiratory variability, suggesting right atrial pressure of 8 mmHg. Comparison(s): Prior images reviewed side by side. The left ventricular function is worsened. Prior EF 60%. FINDINGS  Left Ventricle: Left ventricular ejection fraction, by estimation, is 35 to 40%. The left ventricle has moderately decreased function. The left ventricle demonstrates global hypokinesis. The left ventricular internal cavity size was  normal in size. There is no left ventricular hypertrophy. Left ventricular diastolic parameters are indeterminate. Right Ventricle: The right ventricular size is normal. No increase in right ventricular wall thickness. Right ventricular systolic function is normal. There is normal pulmonary artery systolic pressure. The tricuspid regurgitant velocity is 2.30 m/s, and  with an assumed right atrial pressure of 8 mmHg, the estimated right ventricular systolic pressure is 72.0 mmHg. Left Atrium: Left atrial size was moderately dilated. Right Atrium: Right atrial size was normal in size. Pericardium: There is no evidence of pericardial effusion. Mitral Valve: The mitral valve is normal in structure. Mild mitral valve regurgitation. No evidence of mitral valve stenosis. MV peak gradient, 3.8 mmHg. The mean mitral valve gradient is 1.0 mmHg. Tricuspid Valve: The tricuspid valve is normal in structure. Tricuspid valve regurgitation is moderate . No evidence of tricuspid stenosis. Aortic Valve: The aortic valve is normal in structure. Aortic valve regurgitation is not visualized. No aortic stenosis is present. Pulmonic Valve: The pulmonic valve was normal in structure. Pulmonic valve regurgitation is not visualized. No evidence of pulmonic stenosis. Aorta: The aortic root is normal in size and structure. Venous: The inferior vena cava is dilated in size with greater than 50% respiratory variability, suggesting right atrial pressure of 8 mmHg. IAS/Shunts: No atrial level shunt detected by color flow Doppler.  LEFT VENTRICLE PLAX 2D LVIDd:         4.80 cm LVIDs:         3.70 cm LV PW:         0.90 cm LV IVS:        0.90 cm LVOT diam:     2.20 cm LV SV:         56 LV SV Index:   30 LVOT Area:     3.80 cm  RIGHT VENTRICLE TAPSE (M-mode): 1.6 cm LEFT ATRIUM             Index        RIGHT ATRIUM           Index LA diam:        4.20 cm  2.23 cm/m   RA Area:     18.00 cm LA Vol (A2C):   90.3 ml 47.96 ml/m  RA Volume:   40.80 ml   21.67 ml/m LA Vol (A4C):   68.9 ml 36.60 ml/m LA Biplane Vol: 79.7 ml 42.33 ml/m  AORTIC VALVE LVOT Vmax:   72.50 cm/s LVOT Vmean:  49.000 cm/s LVOT VTI:    0.148 m  AORTA Ao Root diam: 4.00 cm MITRAL VALVE               TRICUSPID VALVE MV Area (PHT): 4.60 cm    TR Peak grad:   21.2 mmHg MV Area VTI:   2.20 cm    TR Vmax:        230.00 cm/s MV Peak grad:  3.8 mmHg MV Mean grad:  1.0 mmHg    SHUNTS MV Vmax:       0.97 m/s    Systemic VTI:  0.15 m MV Vmean:      40.0 cm/s   Systemic Diam: 2.20 cm MV Decel Time: 165 msec MV E velocity: 91.70 cm/s Candee Furbish MD Electronically signed by Candee Furbish MD Signature Date/Time: 09/17/2021/4:49:21 PM    Final (Updated)     Scheduled Meds:  apixaban  5 mg Oral BID   atorvastatin  80 mg Oral Daily   Chlorhexidine Gluconate Cloth  6 each Topical Daily   folic acid  1 mg Oral Daily   metoprolol succinate  50 mg Oral BID   predniSONE  10 mg Oral Q breakfast   sodium chloride flush  3 mL Intravenous Q12H   Continuous Infusions:  sodium chloride       LOS: 2 days   Time spent: 35 minutes  Chaselynn Kepple Wynetta Emery, MD How to contact the [facility] Attending or Consulting provider [place] or covering provider during after hours 7P -7A, for this patient?  Check the care team in [facility] and look for a) attending/consulting TRH provider listed and b) the [facility] team listed Log into [email or web address] and use [place]'s universal password to access. If you do not have the password, please contact the hospital operator. Locate the [facility] provider you are looking for under [facility] and page to a number that you can be directly reached. If you still have difficulty reaching the provider, please page the [facility] (Director on Call) for the Hospitalists listed on amion for assistance.   If 7PM-7AM, please contact night-coverage [email or web address] 09/19/2021, 1:56 PM

## 2021-09-20 ENCOUNTER — Encounter (HOSPITAL_COMMUNITY)
Admission: EM | Disposition: A | Discharge status: Home / Self Care | Payer: Self-pay | Source: Home / Self Care | Attending: Family Medicine

## 2021-09-20 ENCOUNTER — Other Ambulatory Visit: Payer: Self-pay

## 2021-09-20 ENCOUNTER — Inpatient Hospital Stay (HOSPITAL_COMMUNITY): Payer: Medicare Other | Admitting: Certified Registered Nurse Anesthetist

## 2021-09-20 ENCOUNTER — Inpatient Hospital Stay (HOSPITAL_COMMUNITY): Payer: Medicare Other

## 2021-09-20 ENCOUNTER — Encounter (HOSPITAL_COMMUNITY): Payer: Self-pay | Admitting: Internal Medicine

## 2021-09-20 DIAGNOSIS — I340 Nonrheumatic mitral (valve) insufficiency: Secondary | ICD-10-CM

## 2021-09-20 DIAGNOSIS — I10 Essential (primary) hypertension: Secondary | ICD-10-CM | POA: Diagnosis not present

## 2021-09-20 DIAGNOSIS — I4891 Unspecified atrial fibrillation: Secondary | ICD-10-CM

## 2021-09-20 DIAGNOSIS — I2510 Atherosclerotic heart disease of native coronary artery without angina pectoris: Secondary | ICD-10-CM

## 2021-09-20 DIAGNOSIS — I371 Nonrheumatic pulmonary valve insufficiency: Secondary | ICD-10-CM

## 2021-09-20 DIAGNOSIS — I5020 Unspecified systolic (congestive) heart failure: Secondary | ICD-10-CM

## 2021-09-20 DIAGNOSIS — I361 Nonrheumatic tricuspid (valve) insufficiency: Secondary | ICD-10-CM

## 2021-09-20 DIAGNOSIS — I252 Old myocardial infarction: Secondary | ICD-10-CM

## 2021-09-20 DIAGNOSIS — I4819 Other persistent atrial fibrillation: Secondary | ICD-10-CM | POA: Diagnosis not present

## 2021-09-20 DIAGNOSIS — I5041 Acute combined systolic (congestive) and diastolic (congestive) heart failure: Secondary | ICD-10-CM | POA: Diagnosis not present

## 2021-09-20 HISTORY — PX: TEE WITHOUT CARDIOVERSION: SHX5443

## 2021-09-20 HISTORY — PX: CARDIOVERSION: SHX1299

## 2021-09-20 LAB — CBC
HCT: 27.1 % — ABNORMAL LOW (ref 39.0–52.0)
Hemoglobin: 8.9 g/dL — ABNORMAL LOW (ref 13.0–17.0)
MCH: 31 pg (ref 26.0–34.0)
MCHC: 32.8 g/dL (ref 30.0–36.0)
MCV: 94.4 fL (ref 80.0–100.0)
Platelets: 160 10*3/uL (ref 150–400)
RBC: 2.87 MIL/uL — ABNORMAL LOW (ref 4.22–5.81)
RDW: 15.7 % — ABNORMAL HIGH (ref 11.5–15.5)
WBC: 7.6 10*3/uL (ref 4.0–10.5)
nRBC: 0 % (ref 0.0–0.2)

## 2021-09-20 LAB — PROTIME-INR
INR: 1.1 (ref 0.8–1.2)
Prothrombin Time: 14.1 seconds (ref 11.4–15.2)

## 2021-09-20 SURGERY — CARDIOVERSION
Anesthesia: General

## 2021-09-20 MED ORDER — CHLORHEXIDINE GLUCONATE 0.12 % MT SOLN: 15.0000 mL | Freq: Once | OROMUCOSAL | Status: DC

## 2021-09-20 MED ORDER — METOPROLOL SUCCINATE ER 50 MG PO TB24
50.0000 mg | ORAL_TABLET | Freq: Every day | ORAL | Status: DC
  Administered 2021-09-21: 50 mg via ORAL
  Filled 2021-09-20: qty 1

## 2021-09-20 MED ORDER — PROPOFOL 10 MG/ML IV BOLUS
INTRAVENOUS | Status: DC | PRN
  Administered 2021-09-20: 40 mg via INTRAVENOUS
  Administered 2021-09-20 (×2): 20 mg via INTRAVENOUS
  Administered 2021-09-20 (×2): 30 mg via INTRAVENOUS
  Administered 2021-09-20: 60 mg via INTRAVENOUS

## 2021-09-20 MED ORDER — LIDOCAINE 2% (20 MG/ML) 5 ML SYRINGE
INTRAMUSCULAR | Status: DC | PRN
  Administered 2021-09-20: 50 mg via INTRAVENOUS

## 2021-09-20 MED ORDER — ORAL CARE MOUTH RINSE: 15.0000 mL | Freq: Once | OROMUCOSAL | Status: DC

## 2021-09-20 MED ORDER — SODIUM CHLORIDE 0.9 % IV SOLN: INTRAVENOUS | Status: DC

## 2021-09-20 MED ORDER — LACTATED RINGERS IV SOLN: INTRAVENOUS | Status: DC

## 2021-09-20 MED ORDER — PHENYLEPHRINE 80 MCG/ML (10ML) SYRINGE FOR IV PUSH (FOR BLOOD PRESSURE SUPPORT)
PREFILLED_SYRINGE | INTRAVENOUS | Status: DC | PRN
  Administered 2021-09-20: 160 ug via INTRAVENOUS
  Administered 2021-09-20: 80 ug via INTRAVENOUS

## 2021-09-20 MED ORDER — LACTATED RINGERS IV SOLN: INTRAVENOUS | Status: DC | PRN

## 2021-09-20 SURGICAL SUPPLY — 1 items: GLOVE BIOGEL PI IND STRL 7.0 (GLOVE) ×2 IMPLANT

## 2021-09-20 NOTE — Progress Notes (Signed)
PROGRESS NOTE    Darryl Ward  EXN:[id number] DOB: 1945/06/16 DOA: 09/17/2021 PCP: [name], NP   Brief Narrative:    Darryl Ward is a 76 y.o. male with medical history significant for CAD, PVD, CVA, hypertension, dyslipidemia, and stage II lung adenocarcinoma who presented from the cancer center on account of noted elevated heart rates.  He was admitted with symptomatic new onset atrial fibrillation with RVR and now has new LV dysfunction in the setting of recent chemo/immunotherapy.  Cardiology following with plans for likely TEE/DCCV on 8/31.  Assessment & Plan:   Principal Problem:   Atrial fibrillation with RVR (HCC) Active Problems:   Aneurysm of abdominal vessel (HCC)   Mixed hyperlipidemia   Coronary atherosclerosis of native coronary artery   PVD (peripheral vascular disease) (HCC)   Tobacco abuse   Right hemiparesis (HCC)   Primary adenocarcinoma of upper lobe of right lung (HCC)   Acute combined systolic and diastolic heart failure (HCC)   Generalized weakness   Persistent atrial fibrillation (HCC)  Assessment and Plan:   Symptomatic new onset atrial fibrillation with RVR Rate controlled on metoprolol twice daily Eliquis started for given CHA2DS2-VASc score of 5 TSH 1.53 2D echocardiogram as noted below with reduced LV function Appreciate cardiology recommendations with plans for TEE/DCCV 8/31  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 LVEF 35-40% on echocardiogram this admission with prior normal LVEF Toprol 50 mg twice daily and Lasix currently held suspecting tachycardia induced cardiomyopathy versus myocarditis from immunotherapy Cardiology following closely   AKI-resolved Creatinine currently 1.22 and baseline is near 1 Gentle IV fluid and avoid nephrotoxic agents Monitor strict I's and O's Follow labs   History of CAD with prior MI Continue aspirin, statin, and metoprolol   Prior CVA/PVD Continue aspirin and statin    Chronic anemia/thrombocytopenia Monitor carefully while on Eliquis   Hypertension Softer blood pressure readings noted, continue on metoprolol per cardiology and monitor   Dyslipidemia Continue statin   Stage II lung adenocarcinoma Therapy to continue in the outpatient setting once stabilized Follows with Dr. Delton Coombes   Tobacco abuse Quit smoking approximately 5 weeks ago    DVT prophylaxis: apixaban Code Status: Full Family Communication:  Disposition Plan: TEE/DCCV per cardiology 8/31 Status is: Inpatient Remains inpatient appropriate because: Need for close monitoring and IV medications.  Consultants:  Cardiology Discussed with oncology  Procedures:  See below  Antimicrobials:  None  Subjective: Pt tolerated cardioversion well.  No complaints.   Objective: Vitals:   09/20/21 1030 09/20/21 1045 09/20/21 1100 09/20/21 1522  BP: 94/65 108/64 104/66 (!) 107/57  Pulse: 63 (!) 50 (!) 56 (!) 54  Resp: (!) 24 12 16 16   Temp:   97.8 F (36.6 C) (!) 97.5 F (36.4 C)  TempSrc:   Oral Oral  SpO2: 100% 100% 100% 99%  Weight:      Height:        Intake/Output Summary (Last 24 hours) at 09/20/2021 1743 Last data filed at 09/20/2021 0951 Gross per 24 hour  Intake 1230 ml  Output --  Net 1230 ml   Filed Weights   09/17/21 1146 09/20/21 0919  Weight: 71.2 kg 71.2 kg    Examination:  General exam: Appears calm and comfortable chronically ill appearing.  Respiratory system: Clear to auscultation. Respiratory effort normal.  Nasal cannula Cardiovascular system: S1 & S2 normal, irregularly irregular Gastrointestinal system: Abdomen is soft, normal BS, ND/NT, no HSM Central nervous system: Alert and awake Extremities: No edema.  Skin: No significant lesions noted Psychiatry: Flat affect.  Data Reviewed: I have personally reviewed following labs and imaging studies  CBC: Recent Labs  Lab 09/17/21 0917 09/17/21 1150 09/18/21 0820 09/19/21 0410  09/20/21 0507  WBC 4.5 4.6 5.0 5.0 7.6  NEUTROABS 1.9 2.2  --   --   --   HGB 10.2* 9.5* 8.9* 8.9* 8.9*  HCT 29.9* 28.0* 26.5* 26.4* 27.1*  MCV 91.4 92.1 93.3 92.6 94.4  PLT 137* 123* 129* 142* 803   Basic Metabolic Panel: Recent Labs  Lab 09/17/21 0917 09/17/21 1150 09/18/21 0820 09/19/21 0410  NA 142 139 140 139  K 3.4* 3.5 3.7 4.0  CL 106 105 109 110  CO2 25 25 26 24   GLUCOSE 110* 120* 90 95  BUN 19 18 17 15   CREATININE 1.51* 1.47* 1.22 1.11  CALCIUM 8.6* 8.2* 8.2* 8.5*  MG 1.8 2.5* 2.0 2.0   GFR: Estimated Creatinine Clearance: 58.8 mL/min (by C-G formula based on SCr of 1.11 mg/dL). Liver Function Tests: Recent Labs  Lab 09/17/21 0917  AST 20  ALT 15  ALKPHOS 45  BILITOT 0.6  PROT 6.7  ALBUMIN 3.2*   No results for input(s): "LIPASE", "AMYLASE" in the last 168 hours. No results for input(s): "AMMONIA" in the last 168 hours. Coagulation Profile: Recent Labs  Lab 09/17/21 1158 09/20/21 1143  INR 1.2 1.1   Cardiac Enzymes: No results for input(s): "CKTOTAL", "CKMB", "CKMBINDEX", "TROPONINI" in the last 168 hours. BNP (last 3 results) No results for input(s): "PROBNP" in the last 8760 hours. HbA1C: No results for input(s): "HGBA1C" in the last 72 hours. CBG: No results for input(s): "GLUCAP" in the last 168 hours. Lipid Profile: No results for input(s): "CHOL", "HDL", "LDLCALC", "TRIG", "CHOLHDL", "LDLDIRECT" in the last 72 hours. Thyroid Function Tests: No results for input(s): "TSH", "T4TOTAL", "FREET4", "T3FREE", "THYROIDAB" in the last 72 hours.  Anemia Panel: No results for input(s): "VITAMINB12", "FOLATE", "FERRITIN", "TIBC", "IRON", "RETICCTPCT" in the last 72 hours. Sepsis Labs: No results for input(s): "PROCALCITON", "LATICACIDVEN" in the last 168 hours.  No results found for this or any previous visit (from the past 240 hour(s)).    Radiology Studies: ECHO TEE  Result Date: 09/20/2021    TRANSESOPHOGEAL ECHO REPORT   Patient Name:    Darryl Ward Date of Exam: 09/20/2021 Medical Rec #:  [id number]        Height:       70.0 in Accession #:    [id number]       Weight:       156.9 lb Date of Birth:  1945-05-24         BSA:          1.883 m Patient Age:    74 years         BP:           89/58 mmHg Patient Gender: M                HR:           108 bpm. Exam Location:  [facility] Procedure: Transesophageal Echo Indications:    Atrial Fibrillation  History:        Patient has prior history of Echocardiogram examinations, most                 recent 09/17/2021. CHF, CAD and Previous Myocardial Infarction,                 COPD, Arrythmias:Atrial  Fibrillation; Risk Factors:Dyslipidemia                 and Current Smoker.  Sonographer:    Wenda Low Referring Phys: [id number] [place]: TEE procedure time was 9 minutes. The transesophogeal probe was passed without difficulty through the esophogus of the patient. Imaged were obtained with the patient in a left lateral decubitus position. Sedation performed by different physician. The patient was monitored while under deep sedation. Image quality was good. The patient developed no complications during the procedure. IMPRESSIONS  1. Limited study in terms of color Doppler and zooming capabilies of echocardiographic work station.  2. Left ventricular ejection fraction, by estimation, is 30 to 35%. The left ventricle has moderately decreased function. The left ventricle demonstrates global hypokinesis.  3. Right ventricular systolic function is mildly reduced. The right ventricular size is normal.  4. Left atrial size was moderately dilated. No left atrial/left atrial appendage thrombus was detected.  5. Right atrial size was moderately dilated.  6. The mitral valve is grossly normal. Mild mitral valve regurgitation.  7. Tricuspid valve regurgitation is mild to moderate.  8. The aortic valve is tricuspid. Aortic valve regurgitation is not visualized.  9. There is Moderate (Grade III)  atheroma plaque involving the descending aorta. FINDINGS  Left Ventricle: Left ventricular ejection fraction, by estimation, is 30 to 35%. The left ventricle has moderately decreased function. The left ventricle demonstrates global hypokinesis. The left ventricular internal cavity size was normal in size. Right Ventricle: The right ventricular size is normal. No increase in right ventricular wall thickness. Right ventricular systolic function is mildly reduced. Left Atrium: Left atrial size was moderately dilated. No left atrial/left atrial appendage thrombus was detected. Right Atrium: Right atrial size was moderately dilated. Pericardium: There is no evidence of pericardial effusion. Mitral Valve: The mitral valve is grossly normal. Mild mitral valve regurgitation. Tricuspid Valve: The tricuspid valve is grossly normal. Tricuspid valve regurgitation is mild to moderate. Aortic Valve: The aortic valve is tricuspid. Aortic valve regurgitation is not visualized. Pulmonic Valve: The pulmonic valve was grossly normal. Pulmonic valve regurgitation is mild. Aorta: The aortic root is normal in size and structure. There is moderate (Grade III) atheroma plaque involving the descending aorta. IAS/Shunts: The interatrial septum was not assessed. Rozann Lesches MD Electronically signed by Rozann Lesches MD Signature Date/Time: 09/20/2021/11:36:12 AM    Final     Scheduled Meds:  apixaban  5 mg Oral BID   atorvastatin  80 mg Oral Daily   Chlorhexidine Gluconate Cloth  6 each Topical Daily   folic acid  1 mg Oral Daily   [START ON 09/21/2021] metoprolol succinate  50 mg Oral Daily   predniSONE  10 mg Oral Q breakfast   sodium chloride flush  3 mL Intravenous Q12H   Continuous Infusions:  sodium chloride       LOS: 3 days   Time spent: 35 minutes  Fate Galanti Wynetta Emery, MD How to contact the [facility] Attending or Consulting provider [place] or covering provider during after hours [place], for this patient?  Check the  care team in [facility] and look for a) attending/consulting TRH provider listed and b) the [facility] team listed Log into [email or web address] and use [place]'s universal password to access. If you do not have the password, please contact the hospital operator. Locate the [facility] provider you are looking for under [facility] and page to a number that you can be directly reached. If you still  have difficulty reaching the provider, please page the [facility] (Director on Call) for the Hospitalists listed on amion for assistance.   If 7PM-7AM, please contact night-coverage [email or web address] 09/20/2021, 5:43 PM

## 2021-09-20 NOTE — Anesthesia Preprocedure Evaluation (Signed)
Anesthesia Evaluation  Patient identified by MRN, date of birth, ID band Patient awake    Reviewed: Allergy & Precautions, H&P , NPO status , Patient's Chart, lab work & pertinent test results, reviewed documented beta blocker date and time   Airway Mallampati: II  TM Distance: >3 FB Neck ROM: full    Dental no notable dental hx.    Pulmonary COPD, Current Smoker,    Pulmonary exam normal breath sounds clear to auscultation       Cardiovascular Exercise Tolerance: Good hypertension, + CAD and + Past MI  + dysrhythmias Atrial Fibrillation  Rhythm:irregular Rate:Normal     Neuro/Psych TIA Neuromuscular disease negative psych ROS   GI/Hepatic negative GI ROS, Neg liver ROS,   Endo/Other  negative endocrine ROS  Renal/GU negative Renal ROS  negative genitourinary   Musculoskeletal   Abdominal   Peds  Hematology negative hematology ROS (+)   Anesthesia Other Findings   Reproductive/Obstetrics negative OB ROS                             Anesthesia Physical Anesthesia Plan  ASA: 3  Anesthesia Plan: General   Post-op Pain Management:    Induction:   PONV Risk Score and Plan:   Airway Management Planned:   Additional Equipment:   Intra-op Plan:   Post-operative Plan:   Informed Consent: I have reviewed the patients History and Physical, chart, labs and discussed the procedure including the risks, benefits and alternatives for the proposed anesthesia with the patient or authorized representative who has indicated his/her understanding and acceptance.     Dental Advisory Given  Plan Discussed with: CRNA  Anesthesia Plan Comments:         Anesthesia Quick Evaluation

## 2021-09-20 NOTE — Plan of Care (Signed)

## 2021-09-20 NOTE — Interval H&P Note (Signed)
History and Physical Interval Note:  09/20/2021 9:20 AM  Darryl Ward  has presented today for surgery, with the diagnosis of a-fib.  The various methods of treatment have been discussed with the patient and family. After consideration of risks, benefits and other options for treatment, the patient has consented to  Procedure(s): CARDIOVERSION (N/A) TRANSESOPHAGEAL ECHOCARDIOGRAM (TEE) (N/A) as a surgical intervention.  The patient's history has been reviewed, patient examined, no change in status, stable for surgery.  I have reviewed the patient's chart and labs.  Questions were answered to the patient's satisfaction.     Rozann Lesches

## 2021-09-20 NOTE — Transfer of Care (Signed)
Immediate Anesthesia Transfer of Care Note  Patient: Darryl Ward  Procedure(s) Performed: CARDIOVERSION TRANSESOPHAGEAL ECHOCARDIOGRAM (TEE)  Patient Location: PACU  Anesthesia Type:General  Level of Consciousness: awake, alert , oriented and patient cooperative  Airway & Oxygen Therapy: Patient Spontanous Breathing and Patient connected to nasal cannula oxygen  Post-op Assessment: Report given to RN, Post -op Vital signs reviewed and stable and Patient moving all extremities  Post vital signs: Reviewed and stable  Last Vitals:  Vitals Value Taken Time  BP    Temp    Pulse 62 09/20/21 1007  Resp 31 09/20/21 1007  SpO2 100 % 09/20/21 1007  Vitals shown include unvalidated device data.  Last Pain:  Vitals:   09/20/21 0919  TempSrc: Oral  PainSc: 0-No pain         Complications: No notable events documented.

## 2021-09-20 NOTE — CV Procedure (Signed)
Transesophageal echocardiogram guided direct-current cardioversion  Indication: Persistent atrial fibrillation  Description of procedure: After informed consent was obtained, the patient was taken to the PACU where a timeout was performed.  He was placed left lateral decubitus position with bite-block in place.  Anterior and posterior pads were placed in standard fashion and connected to a biphasic defibrillator.  Deep, monitored sedation was provided via the anesthesia service with use of propofol, please refer to their records for dose administration and monitoring. A multiplane transesophageal echocardiographic probe was inserted into the esophagus and multiple images were obtained.  LVEF approximately 30 to 35% with global hypokinesis. Mild RV dysfunction. Moderate biatrial enlargement. No left atrial appendage thrombus documented in 2 separate planes. Mild mitral regurgitation. Mild to moderate tricuspid regurgitation. Mild pulmonic regurgitation. Moderate atherosclerosis noted within the descending aorta.  Following removal of transesophageal echocardiographic probe, patient was positioned supine and prepared for cardioversion.  A single, synchronized 150 J shock was delivered with restoration of sinus rhythm.  He did have bursts of brief atrial fibrillation initially with frequent PACs.  He remained hemodynamically stable without immediate complications.  Disposition: Patient will be returned to the telemetry unit for further management.  Darryl Ward, M.D., F.A.C.C.

## 2021-09-20 NOTE — Progress Notes (Signed)
*  PRELIMINARY RESULTS* Echocardiogram Echocardiogram Transesophageal has been performed.  Darryl Ward 09/20/2021, 11:36 AM

## 2021-09-20 NOTE — Progress Notes (Signed)
Electrical Cardioversion Procedure Note MANCE VALLEJO [id number] 03-21-1945  Procedure: Electrical Cardioversion Indications:  Atrial Fibrillation  Procedure Details Consent: yes Time Out: Verified patient identification, verified procedure, site/side was marked, verified correct patient position, special equipment/implants available, medications/allergies/relevent history reviewed, required imaging and test results available.  Time Out 0930  Patient placed on cardiac monitor, pulse oximetry, supplemental oxygen as necessary.  Sedation given: propofol Pacer pads placed . Anterior and posterior  Cardioverted 1 time(s).  Cardioverted at .150 J  Evaluation Findings: Post procedure EKG shows:  NSR with PVC Complications: None Patient did tolerate procedure well.   Hillery Jacks 09/20/2021, 10:17 AM

## 2021-09-20 NOTE — Progress Notes (Signed)
Progress Note  Patient Name: Darryl Ward Date of Encounter: 09/20/2021  Primary Cardiologist: Minus Breeding, MD  Subjective   No chest pain or palpitations this morning.  Ready for TEE guided cardioversion.  Inpatient Medications    Scheduled Meds:  [MAR Hold] apixaban  5 mg Oral BID   [MAR Hold] atorvastatin  80 mg Oral Daily   [MAR Hold] Chlorhexidine Gluconate Cloth  6 each Topical Daily   [MAR Hold] folic acid  1 mg Oral Daily   [MAR Hold] metoprolol succinate  50 mg Oral BID   [MAR Hold] predniSONE  10 mg Oral Q breakfast   [MAR Hold] sodium chloride flush  3 mL Intravenous Q12H   Continuous Infusions:  [MAR Hold] sodium chloride     sodium chloride     PRN Meds: [MAR Hold] sodium chloride, [MAR Hold] acetaminophen **OR** [MAR Hold] acetaminophen, [MAR Hold] cyclobenzaprine, [MAR Hold] ondansetron **OR** [MAR Hold] ondansetron (ZOFRAN) IV, [MAR Hold] sodium chloride flush   Vital Signs    Vitals:   09/19/21 1411 09/19/21 2129 09/20/21 0544 09/20/21 0919  BP: 97/62 98/79 (!) 89/58 108/67  Pulse: 93 91 80 (!) 111  Resp: 16 19 19 14   Temp: 97.8 F (36.6 C) 97.9 F (36.6 C) 98.1 F (36.7 C) 98 F (36.7 C)  TempSrc: Oral   Oral  SpO2: 100% 100% 100% 95%  Weight:    71.2 kg  Height:    5\' 10"  (1.778 m)    Intake/Output Summary (Last 24 hours) at 09/20/2021 1003 Last data filed at 09/20/2021 0951 Gross per 24 hour  Intake 1230 ml  Output --  Net 1230 ml   Filed Weights   09/17/21 1146 09/20/21 0919  Weight: 71.2 kg 71.2 kg    Telemetry    Atrial fibrillation.  Personally reviewed.  ECG    An ECG dated 09/20/2021 was personally reviewed today and demonstrated:  Atrial fibrillation with nonspecific ST changes.  Physical Exam   GEN: No acute distress.   Neck: No JVD. Cardiac: Irregularly irregular without gallop.  Respiratory: Nonlabored. Clear to auscultation bilaterally. GI: Soft, nontender, bowel sounds present. MS: Trace ankle  edema. Neuro:  Nonfocal. Psych: Alert and oriented x 3. Normal affect.  Labs    Chemistry Recent Labs  Lab 09/17/21 0917 09/17/21 1150 09/18/21 0820 09/19/21 0410  NA 142 139 140 139  K 3.4* 3.5 3.7 4.0  CL 106 105 109 110  CO2 25 25 26 24   GLUCOSE 110* 120* 90 95  BUN 19 18 17 15   CREATININE 1.51* 1.47* 1.22 1.11  CALCIUM 8.6* 8.2* 8.2* 8.5*  PROT 6.7  --   --   --   ALBUMIN 3.2*  --   --   --   AST 20  --   --   --   ALT 15  --   --   --   ALKPHOS 45  --   --   --   BILITOT 0.6  --   --   --   GFRNONAA 48* 50* >60 >60  ANIONGAP 11 9 5 5      Hematology Recent Labs  Lab 09/18/21 0820 09/19/21 0410 09/20/21 0507  WBC 5.0 5.0 7.6  RBC 2.84* 2.85* 2.87*  HGB 8.9* 8.9* 8.9*  HCT 26.5* 26.4* 27.1*  MCV 93.3 92.6 94.4  MCH 31.3 31.2 31.0  MCHC 33.6 33.7 32.8  RDW 14.9 15.0 15.7*  PLT 129* 142* 160    Cardiac Enzymes Recent Labs  Lab 09/18/21 1337  TROPONINIHS 10    Radiology    No results found.  Cardiac Studies   Echocardiogram 09/17/2021:  1. Left ventricular ejection fraction, by estimation, is 35 to 40%. The  left ventricle has moderately decreased function. The left ventricle  demonstrates global hypokinesis. Left ventricular diastolic parameters are  indeterminate.   2. Right ventricular systolic function is normal. The right ventricular  size is normal. There is normal pulmonary artery systolic pressure. The  estimated right ventricular systolic pressure is 16.1 mmHg.   3. Left atrial size was moderately dilated.   4. The mitral valve is normal in structure. Mild mitral valve  regurgitation. No evidence of mitral stenosis.   5. Tricuspid valve regurgitation is moderate.   6. The aortic valve is normal in structure. Aortic valve regurgitation is  not visualized. No aortic stenosis is present.   7. The inferior vena cava is dilated in size with >50% respiratory  variability, suggesting right atrial pressure of 8 mmHg.   Assessment & Plan     1.  Persistent, newly documented atrial fibrillation with RVR, CHA2DS2-VASc score is 5.  He is now on Eliquis for stroke prophylaxis along with Toprol-XL, plan for TEE guided cardioversion today.  Risks and benefits discussed, informed consent obtained and in the chart.  2.  HFrEF with newly documented cardiomyopathy, LVEF 35 to 40% range.  Possibly tachycardia induced but also question of association with Opdivo.  He has also received carboplatin and pemetrexed recently.  3.  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  4.  CAD s/p angioplasty of first diagonal in 2007.  At this point do not suspect ischemic etiology for cardiomyopathy, no active angina symptoms.  No longer on aspirin given initiation of Eliquis.  He is on statin therapy.  5.  Acute kidney injury, creatinine has normalized at 1.1.  Plan to proceed with TEE guided cardioversion today.  If sinus rhythm restored, can then turn attention to gradual up titration of GDMT.  Dr. Phineas Inches spoke with oncologist, would plan on repeat echocardiogram in a month prior to next anticipated dose of Opdivo.  If atrial fibrillation recurs in the short-term, would need to consider antiarrhythmic therapy and then a repeat cardioversion attempt.  Currently on Eliquis, Toprol-XL, and Lipitor.  Signed, Rozann Lesches, MD  09/20/2021, 10:03 AM

## 2021-09-21 ENCOUNTER — Other Ambulatory Visit: Payer: Self-pay

## 2021-09-21 DIAGNOSIS — I4891 Unspecified atrial fibrillation: Secondary | ICD-10-CM | POA: Diagnosis not present

## 2021-09-21 DIAGNOSIS — C3411 Malignant neoplasm of upper lobe, right bronchus or lung: Secondary | ICD-10-CM

## 2021-09-21 DIAGNOSIS — I5041 Acute combined systolic (congestive) and diastolic (congestive) heart failure: Secondary | ICD-10-CM | POA: Diagnosis not present

## 2021-09-21 DIAGNOSIS — I2510 Atherosclerotic heart disease of native coronary artery without angina pectoris: Secondary | ICD-10-CM | POA: Diagnosis not present

## 2021-09-21 DIAGNOSIS — R531 Weakness: Secondary | ICD-10-CM | POA: Diagnosis not present

## 2021-09-21 MED ORDER — HEPARIN SOD (PORK) LOCK FLUSH 100 UNIT/ML IV SOLN
500.0000 [IU] | Freq: Once | INTRAVENOUS | Status: DC
Start: 2021-09-21 — End: 2021-09-21

## 2021-09-21 MED ORDER — APIXABAN 5 MG PO TABS: 5.0000 mg | ORAL_TABLET | Freq: Two times a day (BID) | ORAL | 2 refills | Status: DC

## 2021-09-21 MED ORDER — METOPROLOL SUCCINATE ER 50 MG PO TB24: 50.0000 mg | ORAL_TABLET | Freq: Every day | ORAL | 2 refills | Status: DC

## 2021-09-21 MED ORDER — HEPARIN SOD (PORK) LOCK FLUSH 100 UNIT/ML IV SOLN
INTRAVENOUS | Status: AC
  Filled 2021-09-21: qty 5

## 2021-09-21 NOTE — Anesthesia Postprocedure Evaluation (Signed)
Anesthesia Post Note  Patient: [name] Godwin  Procedure(s) Performed: CARDIOVERSION TRANSESOPHAGEAL ECHOCARDIOGRAM (TEE)  Patient location during evaluation: Phase II Anesthesia Type: General Level of consciousness: awake Pain management: pain level controlled Vital Signs Assessment: post-procedure vital signs reviewed and stable Respiratory status: spontaneous breathing and respiratory function stable Cardiovascular status: blood pressure returned to baseline and stable Postop Assessment: no headache and no apparent nausea or vomiting Anesthetic complications: no Comments: Late entry   No notable events documented.   Last Vitals:  Vitals:   09/20/21 1942 09/21/21 0444  BP: 112/64 116/69  Pulse:  64  Resp: 20 18  Temp: 36.6 C 36.9 C  SpO2: 100% 100%    Last Pain:  Vitals:   09/21/21 0444  TempSrc: Oral  PainSc:                  Louann Sjogren

## 2021-09-21 NOTE — Discharge Summary (Addendum)
Physician Discharge Summary  Darryl Ward VQM:[id number] DOB: 11-10-1945 DOA: 09/17/2021  PCP: [name], NP Cardiologist: Percival Spanish Oncologist: Delton Coombes   Admit date: 09/17/2021 Discharge date: 09/21/2021  Admitted From:  Home  Disposition: Home   Recommendations for Outpatient Follow-up:  Follow up with PCP in 1 weeks Follow up with Dr. Percival Spanish on 10/01/21 as scheduled Follow up with Dr. Delton Coombes in 1 week  Please obtain CBC in one week to follow up platelets Please obtain 2D echocardiogram in 1 month for ongoing surveillance  Discharge Condition: STABLE   CODE STATUS: FULL DIET: low sodium recommended    Brief Hospitalization Summary: Please see all hospital notes, images, labs for full details of the hospitalization. ADMISSION HPI:  76 y.o. male with medical history significant for CAD, PVD, CVA, hypertension, dyslipidemia, and stage II lung adenocarcinoma who presented from the cancer center on account of noted elevated heart rates.  He has apparently been having exertional shortness of breath over the last 3 weeks with some associated dizziness.  He denies any chest pain or palpitations and states that the symptoms began shortly after receiving chemotherapy with immunotherapy approximately 3 weeks ago.  He figured that much of his symptoms were attributed to his treatment and did not realize that he could have another issue.  He denies any cough, fevers, chills, or sick contacts.   ED Course: Vital signs with elevated heart rate and EKG demonstrating atrial fibrillation with RVR.  Hemoglobin 9.5 and platelet count of 123. Creatinine 1.47.  Chest x-ray with no acute findings.  He was started on Cardizem drip as well as heparin drip in the ED.  Hospital Course by Problem   Symptomatic new onset atrial fibrillation with RVR Rate controlled on metoprolol XL 50 mg daily Apixaban started for given CHA2DS2-VASc score of 5 TSH 1.53 2D echocardiogram as noted below with  reduced LV function Appreciate cardiology recommendations with plans for TEE/DCCV 8/31 HR remains SR after DCCV.  DC home today.  Follow up with Dr. Percival Spanish on 10/01/21.  Echo in 1 month recommended by cardiology.    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 Cardiology following closely outpatient    AKI-resolved Creatinine currently 1.22 and baseline is near 1 Gentle IV fluid given and avoid nephrotoxic agents Monitor strict I's and O's Follow labs outpatient for ongoing surveillance   History of CAD with prior MI Continue aspirin, statin, and metoprolol   Prior CVA/PVD Continue aspirin and statin   Chronic anemia/thrombocytopenia Monitor carefully while on Eliquis   Hypertension Softer blood pressure readings noted, continue on metoprolol per cardiology and did not add further meds at this time   Dyslipidemia Continue statin   Stage II lung adenocarcinoma Therapy to continue in the outpatient setting once stabilized Follows with Dr. Delton Coombes   Tobacco abuse Quit smoking approximately 5 weeks ago     DVT prophylaxis: apixaban Code Status: Full Family Communication:  Disposition Plan: TEE/DCCV per cardiology 8/31 Discharge Diagnoses:  Principal Problem:   Atrial fibrillation with RVR ([place]) Active Problems:   Aneurysm of abdominal vessel ([place])   Mixed hyperlipidemia   Coronary atherosclerosis of native coronary artery   PVD (peripheral vascular disease) (HCC)   Tobacco abuse   Right hemiparesis (HCC)   Primary adenocarcinoma of upper lobe of right lung (HCC)   Acute combined systolic and diastolic heart failure (HCC)   Generalized weakness  Persistent atrial fibrillation [facility])   Discharge Instructions:  Allergies as of 09/21/2021   No Known Allergies      Medication List     STOP taking  these medications    aspirin EC 81 MG tablet   furosemide 20 MG tablet Commonly known as: LASIX       TAKE these medications    apixaban 5 MG Tabs tablet Commonly known as: ELIQUIS Take 1 tablet (5 mg total) by mouth 2 (two) times daily.   atorvastatin 80 MG tablet Commonly known as: LIPITOR Take 80 mg by mouth daily.   CARBOPLATIN IV Inject into the vein every 21 ( twenty-one) days.   cyclobenzaprine 10 MG tablet Commonly known as: FLEXERIL Take 10 mg by mouth 2 (two) times daily as needed for muscle spasms.   folic acid 1 MG tablet Commonly known as: FOLVITE Take 1 tablet (1 mg total) by mouth daily.   lidocaine-prilocaine cream Commonly known as: EMLA Apply a small amount to port a cath site and cover with plastic wrap 1 hour prior to infusion appointments   metoprolol succinate 50 MG 24 hr tablet Commonly known as: TOPROL-XL Take 1 tablet (50 mg total) by mouth daily. What changed:  medication strength how much to take   OPDIVO IV Inject into the vein.   PEMETREXED IV Inject into the vein every 21 ( twenty-one) days.   predniSONE 10 MG tablet Commonly known as: DELTASONE Take 1 tablet (10 mg total) by mouth daily with breakfast.   prochlorperazine 10 MG tablet Commonly known as: COMPAZINE Take 1 tablet (10 mg total) by mouth every 6 (six) hours as needed (Nausea or vomiting).        Follow-up Information     Minus Breeding, MD Follow up on 10/01/2021.   Specialty: Cardiology Why: Cardiology Follow-up on 10/01/2021 at 11:20 AM. Contact information: [place] [address] [phone]         [name], NP Follow up in 1 week(s).   Specialty: Internal Medicine Why: Hospital Follow Up Contact information: [name] [place] [address] [phone]         Derek Jack, MD. Schedule an appointment as soon as possible for a visit on 09/25/2021.   Specialty: Hematology Why: Hospital  Follow Up Contact information: [place] [address] [phone]                No Known Allergies Allergies as of 09/21/2021   No Known Allergies      Medication List     STOP taking these medications    aspirin EC 81 MG tablet   furosemide 20 MG tablet Commonly known as: LASIX       TAKE these medications    apixaban 5 MG Tabs tablet Commonly known as: ELIQUIS Take 1 tablet (5 mg total) by mouth 2 (two) times daily.   atorvastatin 80 MG tablet Commonly known as: LIPITOR Take 80 mg by mouth daily.   CARBOPLATIN IV Inject into the vein every 21 ( twenty-one) days.   cyclobenzaprine 10 MG tablet Commonly known as: FLEXERIL Take 10 mg by mouth 2 (two) times daily as needed for muscle spasms.   folic acid 1 MG tablet Commonly known as: FOLVITE Take 1 tablet (1 mg total) by mouth daily.   lidocaine-prilocaine cream Commonly known as: EMLA Apply a small amount to port a cath site and cover with plastic wrap 1 hour prior  to infusion appointments   metoprolol succinate 50 MG 24 hr tablet Commonly known as: TOPROL-XL Take 1 tablet (50 mg total) by mouth daily. What changed:  medication strength how much to take   OPDIVO IV Inject into the vein.   PEMETREXED IV Inject into the vein every 21 ( twenty-one) days.   predniSONE 10 MG tablet Commonly known as: DELTASONE Take 1 tablet (10 mg total) by mouth daily with breakfast.   prochlorperazine 10 MG tablet Commonly known as: COMPAZINE Take 1 tablet (10 mg total) by mouth every 6 (six) hours as needed (Nausea or vomiting).        Procedures/Studies: ECHO TEE  Result Date: 09/20/2021    TRANSESOPHOGEAL ECHO REPORT   Patient Name:   Darryl Ward Date of Exam: 09/20/2021 Medical Rec #:  [id number]        Height:       70.0 in Accession #:    [id number]       Weight:       156.9 lb Date of Birth:  10-17-1945         BSA:          1.883 m Patient Age:    72 years         BP:            89/58 mmHg Patient Gender: M                HR:           108 bpm. Exam Location:  [facility] Procedure: Transesophageal Echo Indications:    Atrial Fibrillation  History:        Patient has prior history of Echocardiogram examinations, most                 recent 09/17/2021. CHF, CAD and Previous Myocardial Infarction,                 COPD, Arrythmias:Atrial Fibrillation; Risk Factors:Dyslipidemia                 and Current Smoker.  Sonographer:    Wenda Low Referring Phys: [id number] [place]: TEE procedure time was 9 minutes. The transesophogeal probe was passed without difficulty through the esophogus of the patient. Imaged were obtained with the patient in a left lateral decubitus position. Sedation performed by different physician. The patient was monitored while under deep sedation. Image quality was good. The patient developed no complications during the procedure. IMPRESSIONS  1. Limited study in terms of color Doppler and zooming capabilies of echocardiographic work station.  2. Left ventricular ejection fraction, by estimation, is 30 to 35%. The left ventricle has moderately decreased function. The left ventricle demonstrates global hypokinesis.  3. Right ventricular systolic function is mildly reduced. The right ventricular size is normal.  4. Left atrial size was moderately dilated. No left atrial/left atrial appendage thrombus was detected.  5. Right atrial size was moderately dilated.  6. The mitral valve is grossly normal. Mild mitral valve regurgitation.  7. Tricuspid valve regurgitation is mild to moderate.  8. The aortic valve is tricuspid. Aortic valve regurgitation is not visualized.  9. There is Moderate (Grade III) atheroma plaque involving the descending aorta. FINDINGS  Left Ventricle: Left ventricular ejection fraction, by estimation, is 30 to 35%. The left ventricle has moderately decreased function. The left ventricle demonstrates global hypokinesis. The left  ventricular internal cavity size was normal in size. Right Ventricle: The right ventricular size  is normal. No increase in right ventricular wall thickness. Right ventricular systolic function is mildly reduced. Left Atrium: Left atrial size was moderately dilated. No left atrial/left atrial appendage thrombus was detected. Right Atrium: Right atrial size was moderately dilated. Pericardium: There is no evidence of pericardial effusion. Mitral Valve: The mitral valve is grossly normal. Mild mitral valve regurgitation. Tricuspid Valve: The tricuspid valve is grossly normal. Tricuspid valve regurgitation is mild to moderate. Aortic Valve: The aortic valve is tricuspid. Aortic valve regurgitation is not visualized. Pulmonic Valve: The pulmonic valve was grossly normal. Pulmonic valve regurgitation is mild. Aorta: The aortic root is normal in size and structure. There is moderate (Grade III) atheroma plaque involving the descending aorta. IAS/Shunts: The interatrial septum was not assessed. Rozann Lesches MD Electronically signed by Rozann Lesches MD Signature Date/Time: 09/20/2021/11:36:12 AM    Final    ECHOCARDIOGRAM COMPLETE  Result Date: 09/17/2021    ECHOCARDIOGRAM REPORT   Patient Name:   FOUNTAIN DERUSHA Date of Exam: 09/17/2021 Medical Rec #:  [id number]        Height:       70.0 in Accession #:    [id number]       Weight:       156.9 lb Date of Birth:  01/03/1946         BSA:          1.883 m Patient Age:    76 years         BP:           114/75 mmHg Patient Gender: M                HR:           96 bpm. Exam Location:  [facility] Procedure: 2D Echo, Cardiac Doppler and Color Doppler Indications:     Atrial Fibrillation I48.91  History:         Patient has prior history of Echocardiogram examinations, most                  recent 02/05/2020. COPD and Stroke; Risk Factors:Hypertension                  and Dyslipidemia. Primary adenocarcinoma of upper lobe of right                  lung ([place]), Tobacco abuse,  NSTEMI (non-ST elevated myocardial                  infarction) ([place]) (From Hx).  Sonographer:     Alvino Chapel RCS Referring Phys:  [id number] Royanne Foots [facility] Diagnosing Phys: Candee Furbish MD IMPRESSIONS  1. Left ventricular ejection fraction, by estimation, is 35 to 40%. The left ventricle has moderately decreased function. The left ventricle demonstrates global hypokinesis. Left ventricular diastolic parameters are indeterminate.  2. Right ventricular systolic function is normal. The right ventricular size is normal. There is normal pulmonary artery systolic pressure. The estimated right ventricular systolic pressure is 25.8 mmHg.  3. Left atrial size was moderately dilated.  4. The mitral valve is normal in structure. Mild mitral valve regurgitation. No evidence of mitral stenosis.  5. Tricuspid valve regurgitation is moderate.  6. The aortic valve is normal in structure. Aortic valve regurgitation is not visualized. No aortic stenosis is present.  7. The inferior vena cava is dilated in size with >50% respiratory variability, suggesting right atrial pressure of 8 mmHg. Comparison(s): Prior images reviewed side by side. The  left ventricular function is worsened. Prior EF 60%. FINDINGS  Left Ventricle: Left ventricular ejection fraction, by estimation, is 35 to 40%. The left ventricle has moderately decreased function. The left ventricle demonstrates global hypokinesis. The left ventricular internal cavity size was normal in size. There is no left ventricular hypertrophy. Left ventricular diastolic parameters are indeterminate. Right Ventricle: The right ventricular size is normal. No increase in right ventricular wall thickness. Right ventricular systolic function is normal. There is normal pulmonary artery systolic pressure. The tricuspid regurgitant velocity is 2.30 m/s, and  with an assumed right atrial pressure of 8 mmHg, the estimated right ventricular systolic pressure is 85.2 mmHg. Left Atrium: Left atrial  size was moderately dilated. Right Atrium: Right atrial size was normal in size. Pericardium: There is no evidence of pericardial effusion. Mitral Valve: The mitral valve is normal in structure. Mild mitral valve regurgitation. No evidence of mitral valve stenosis. MV peak gradient, 3.8 mmHg. The mean mitral valve gradient is 1.0 mmHg. Tricuspid Valve: The tricuspid valve is normal in structure. Tricuspid valve regurgitation is moderate . No evidence of tricuspid stenosis. Aortic Valve: The aortic valve is normal in structure. Aortic valve regurgitation is not visualized. No aortic stenosis is present. Pulmonic Valve: The pulmonic valve was normal in structure. Pulmonic valve regurgitation is not visualized. No evidence of pulmonic stenosis. Aorta: The aortic root is normal in size and structure. Venous: The inferior vena cava is dilated in size with greater than 50% respiratory variability, suggesting right atrial pressure of 8 mmHg. IAS/Shunts: No atrial level shunt detected by color flow Doppler.  LEFT VENTRICLE PLAX 2D LVIDd:         4.80 cm LVIDs:         3.70 cm LV PW:         0.90 cm LV IVS:        0.90 cm LVOT diam:     2.20 cm LV SV:         56 LV SV Index:   30 LVOT Area:     3.80 cm  RIGHT VENTRICLE TAPSE (M-mode): 1.6 cm LEFT ATRIUM             Index        RIGHT ATRIUM           Index LA diam:        4.20 cm 2.23 cm/m   RA Area:     18.00 cm LA Vol (A2C):   90.3 ml 47.96 ml/m  RA Volume:   40.80 ml  21.67 ml/m LA Vol (A4C):   68.9 ml 36.60 ml/m LA Biplane Vol: 79.7 ml 42.33 ml/m  AORTIC VALVE LVOT Vmax:   72.50 cm/s LVOT Vmean:  49.000 cm/s LVOT VTI:    0.148 m  AORTA Ao Root diam: 4.00 cm MITRAL VALVE               TRICUSPID VALVE MV Area (PHT): 4.60 cm    TR Peak grad:   21.2 mmHg MV Area VTI:   2.20 cm    TR Vmax:        230.00 cm/s MV Peak grad:  3.8 mmHg MV Mean grad:  1.0 mmHg    SHUNTS MV Vmax:       0.97 m/s    Systemic VTI:  0.15 m MV Vmean:      40.0 cm/s   Systemic Diam: 2.20 cm MV  Decel Time: 165 msec MV E velocity: 91.70 cm/s Candee Furbish MD  Electronically signed by Candee Furbish MD Signature Date/Time: 09/17/2021/4:49:21 PM    Final (Updated)    DG Chest 2 View  Result Date: 09/17/2021 CLINICAL DATA:  Shortness of breath and dizziness. EXAM: CHEST - 2 VIEW COMPARISON:  Chest radiograph 06/11/2021 FINDINGS: A left chest wall port is in place with tip terminating at the cavoatrial junction. The cardiomediastinal silhouette is stable. There is no focal consolidation or pulmonary edema. There is no pleural effusion or pneumothorax. A fiducial marker is again seen in the right upper lobe with decreased surrounding density likely reflecting resolved post biopsy hemorrhage. There is no acute osseous abnormality. IMPRESSION: 1. Fiducial marker in place in the right upper lobe with decreased surrounding density likely reflecting resolved post biopsy hemorrhage. 2. No radiographic evidence of acute cardiopulmonary process. Electronically Signed   By: Valetta Mole M.D.   On: 09/17/2021 12:35     Subjective: Pt really wants to go home today.  He feels well.  He slept well.    Discharge Exam: Vitals:   09/20/21 1942 09/21/21 0444  BP: 112/64 116/69  Pulse:  64  Resp: 20 18  Temp: 97.8 F (36.6 C) 98.4 F (36.9 C)  SpO2: 100% 100%   Vitals:   09/20/21 1100 09/20/21 1522 09/20/21 1942 09/21/21 0444  BP: 104/66 (!) 107/57 112/64 116/69  Pulse: (!) 56 (!) 54  64  Resp: 16 16 20 18   Temp: 97.8 F (36.6 C) (!) 97.5 F (36.4 C) 97.8 F (36.6 C) 98.4 F (36.9 C)  TempSrc: Oral Oral Oral Oral  SpO2: 100% 99% 100% 100%  Weight:      Height:        General: Pt is alert, awake, not in acute distress Cardiovascular: normal S1/S2 +, no rubs, no gallops Respiratory: CTA bilaterally, no wheezing, no rhonchi Abdominal: Soft, NT, ND, bowel sounds + Extremities: no edema, no cyanosis   The results of significant diagnostics from this hospitalization (including imaging, microbiology,  ancillary and laboratory) are listed below for reference.     Microbiology: No results found for this or any previous visit (from the past 240 hour(s)).   Labs: BNP (last 3 results) No results for input(s): "BNP" in the last 8760 hours. Basic Metabolic Panel: Recent Labs  Lab 09/17/21 0917 09/17/21 1150 09/18/21 0820 09/19/21 0410  NA 142 139 140 139  K 3.4* 3.5 3.7 4.0  CL 106 105 109 110  CO2 25 25 26 24   GLUCOSE 110* 120* 90 95  BUN 19 18 17 15   CREATININE 1.51* 1.47* 1.22 1.11  CALCIUM 8.6* 8.2* 8.2* 8.5*  MG 1.8 2.5* 2.0 2.0   Liver Function Tests: Recent Labs  Lab 09/17/21 0917  AST 20  ALT 15  ALKPHOS 45  BILITOT 0.6  PROT 6.7  ALBUMIN 3.2*   No results for input(s): "LIPASE", "AMYLASE" in the last 168 hours. No results for input(s): "AMMONIA" in the last 168 hours. CBC: Recent Labs  Lab 09/17/21 0917 09/17/21 1150 09/18/21 0820 09/19/21 0410 09/20/21 0507  WBC 4.5 4.6 5.0 5.0 7.6  NEUTROABS 1.9 2.2  --   --   --   HGB 10.2* 9.5* 8.9* 8.9* 8.9*  HCT 29.9* 28.0* 26.5* 26.4* 27.1*  MCV 91.4 92.1 93.3 92.6 94.4  PLT 137* 123* 129* 142* 160   Cardiac Enzymes: No results for input(s): "CKTOTAL", "CKMB", "CKMBINDEX", "TROPONINI" in the last 168 hours. BNP: Invalid input(s): "POCBNP" CBG: No results for input(s): "GLUCAP" in the last 168 hours. D-Dimer No  results for input(s): "DDIMER" in the last 72 hours. Hgb A1c No results for input(s): "HGBA1C" in the last 72 hours. Lipid Profile No results for input(s): "CHOL", "HDL", "LDLCALC", "TRIG", "CHOLHDL", "LDLDIRECT" in the last 72 hours. Thyroid function studies No results for input(s): "TSH", "T4TOTAL", "T3FREE", "THYROIDAB" in the last 72 hours.  Invalid input(s): "FREET3" Anemia work up No results for input(s): "VITAMINB12", "FOLATE", "FERRITIN", "TIBC", "IRON", "RETICCTPCT" in the last 72 hours. Urinalysis    Component Value Date/Time   COLORURINE STRAW (A) 02/04/2020 1500   APPEARANCEUR  CLEAR 02/04/2020 1500   LABSPEC 1.004 (L) 02/04/2020 1500   PHURINE 6.0 02/04/2020 1500   GLUCOSEU NEGATIVE 02/04/2020 1500   HGBUR NEGATIVE 02/04/2020 1500   BILIRUBINUR NEGATIVE 02/04/2020 1500   KETONESUR NEGATIVE 02/04/2020 1500   PROTEINUR NEGATIVE 02/04/2020 1500   UROBILINOGEN 1.0 01/10/2012 1728   NITRITE NEGATIVE 02/04/2020 1500   LEUKOCYTESUR NEGATIVE 02/04/2020 1500   Sepsis Labs Recent Labs  Lab 09/17/21 1150 09/18/21 0820 09/19/21 0410 09/20/21 0507  WBC 4.6 5.0 5.0 7.6   Microbiology No results found for this or any previous visit (from the past 240 hour(s)).  Time coordinating discharge: 41 mins   SIGNED:  Irwin Brakeman, MD  [facility] 09/21/2021, 10:53 AM How to contact the [facility] Attending or Consulting provider [place] or covering provider during after hours [place], for this patient?  Check the care team in [facility] and look for a) attending/consulting [facility] provider listed and b) the [facility] team listed Log into [email or web address] and use [place]'s universal password to access. If you do not have the password, please contact the hospital operator. Locate the [facility] provider you are looking for under [facility] and page to a number that you can be directly reached. If you still have difficulty reaching the provider, please page the [facility] (Director on Call) for the Hospitalists listed on amion for assistance.

## 2021-09-21 NOTE — Progress Notes (Signed)
Rounding Note    Patient Name: Darryl Ward Date of Encounter: 09/21/2021  [place] Cardiologist: Minus Breeding, MD   Subjective   Breathing improved. No chest pain or palpitations but did not have these when in atrial fibrillation. Discussed the importance of monitoring his vitals at home. Anxious to go home today.   Inpatient Medications    Scheduled Meds:  apixaban  5 mg Oral BID   atorvastatin  80 mg Oral Daily   Chlorhexidine Gluconate Cloth  6 each Topical Daily   folic acid  1 mg Oral Daily   metoprolol succinate  50 mg Oral Daily   predniSONE  10 mg Oral Q breakfast   sodium chloride flush  3 mL Intravenous Q12H   Continuous Infusions:  sodium chloride     PRN Meds: sodium chloride, acetaminophen **OR** acetaminophen, cyclobenzaprine, ondansetron **OR** ondansetron (ZOFRAN) IV, sodium chloride flush   Vital Signs    Vitals:   09/20/21 1100 09/20/21 1522 09/20/21 1942 09/21/21 0444  BP: 104/66 (!) 107/57 112/64 116/69  Pulse: (!) 56 (!) 54  64  Resp: 16 16 20 18   Temp: 97.8 F (36.6 C) (!) 97.5 F (36.4 C) 97.8 F (36.6 C) 98.4 F (36.9 C)  TempSrc: Oral Oral Oral Oral  SpO2: 100% 99% 100% 100%  Weight:      Height:        Intake/Output Summary (Last 24 hours) at 09/21/2021 0811 Last data filed at 09/20/2021 2204 Gross per 24 hour  Intake 513 ml  Output --  Net 513 ml      09/20/2021    9:19 AM 09/17/2021   11:46 AM 09/17/2021    9:01 AM  Last 3 Weights  Weight (lbs) 156 lb 14.4 oz 156 lb 14.4 oz 156 lb 14.4 oz  Weight (kg) 71.169 kg 71.169 kg 71.169 kg      Telemetry    NSR, HR in 50's to 60's. Occasional PVC's. Brief tachycardia this AM lasting for less than 5 seconds and appearing most consistent with an atrial tachycardia.  - Personally Reviewed  ECG    NSR, HR 71 with PAC's and baseline artifact which limits interpretation. - Personally Reviewed  Physical Exam   GEN: Pleasant male appearing in no acute distress.    Neck: No JVD Cardiac: RRR, no murmurs, rubs, or gallops.  Respiratory: Clear to auscultation bilaterally without wheezing or rales. GI: Soft, nontender, non-distended  MS: No pitting edema; No deformity. Neuro:  Nonfocal  Psych: Normal affect   Labs    High Sensitivity Troponin:   Recent Labs  Lab 09/18/21 1337  TROPONINIHS 10     Chemistry Recent Labs  Lab 09/17/21 0917 09/17/21 1150 09/18/21 0820 09/19/21 0410  NA 142 139 140 139  K 3.4* 3.5 3.7 4.0  CL 106 105 109 110  CO2 25 25 26 24   GLUCOSE 110* 120* 90 95  BUN 19 18 17 15   CREATININE 1.51* 1.47* 1.22 1.11  CALCIUM 8.6* 8.2* 8.2* 8.5*  MG 1.8 2.5* 2.0 2.0  PROT 6.7  --   --   --   ALBUMIN 3.2*  --   --   --   AST 20  --   --   --   ALT 15  --   --   --   ALKPHOS 45  --   --   --   BILITOT 0.6  --   --   --   GFRNONAA 48* 50* >60 >  60  ANIONGAP 11 9 5 5     Lipids No results for input(s): "CHOL", "TRIG", "HDL", "LABVLDL", "LDLCALC", "CHOLHDL" in the last 168 hours.  Hematology Recent Labs  Lab 09/18/21 0820 09/19/21 0410 09/20/21 0507  WBC 5.0 5.0 7.6  RBC 2.84* 2.85* 2.87*  HGB 8.9* 8.9* 8.9*  HCT 26.5* 26.4* 27.1*  MCV 93.3 92.6 94.4  MCH 31.3 31.2 31.0  MCHC 33.6 33.7 32.8  RDW 14.9 15.0 15.7*  PLT 129* 142* 160   Thyroid  Recent Labs  Lab 09/17/21 1023  TSH 1.534    BNPNo results for input(s): "BNP", "PROBNP" in the last 168 hours.  DDimer No results for input(s): "DDIMER" in the last 168 hours.    Cardiac Studies   Echocardiogram: 09/17/2021 IMPRESSIONS     1. Left ventricular ejection fraction, by estimation, is 35 to 40%. The  left ventricle has moderately decreased function. The left ventricle  demonstrates global hypokinesis. Left ventricular diastolic parameters are  indeterminate.   2. Right ventricular systolic function is normal. The right ventricular  size is normal. There is normal pulmonary artery systolic pressure. The  estimated right ventricular systolic pressure  is 26.8 mmHg.   3. Left atrial size was moderately dilated.   4. The mitral valve is normal in structure. Mild mitral valve  regurgitation. No evidence of mitral stenosis.   5. Tricuspid valve regurgitation is moderate.   6. The aortic valve is normal in structure. Aortic valve regurgitation is  not visualized. No aortic stenosis is present.   7. The inferior vena cava is dilated in size with >50% respiratory  variability, suggesting right atrial pressure of 8 mmHg.   TEE: 09/21/2021 IMPRESSIONS     1. Limited study in terms of color Doppler and zooming capabilies of  echocardiographic work station.   2. Left ventricular ejection fraction, by estimation, is 30 to 35%. The  left ventricle has moderately decreased function. The left ventricle  demonstrates global hypokinesis.   3. Right ventricular systolic function is mildly reduced. The right  ventricular size is normal.   4. Left atrial size was moderately dilated. No left atrial/left atrial  appendage thrombus was detected.   5. Right atrial size was moderately dilated.   6. The mitral valve is grossly normal. Mild mitral valve regurgitation.   7. Tricuspid valve regurgitation is mild to moderate.   8. The aortic valve is tricuspid. Aortic valve regurgitation is not  visualized.   9. There is Moderate (Grade III) atheroma plaque involving the descending  aorta.   Patient Profile     76 y.o. 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    Assessment & Plan    1. New-Onset Atrial Fibrillation with RVR - Presented with elevated rates in the 130's to 140's which had been occurring for several weeks but was overall asymptomatic.Rates remained poorly controlled despite titration of AV nodal blocking agents as BP limited this and he underwent  successful TEE/DCCV yesterday with return to NSR following a 150 J shock. He did have brief atrial fibrillation and PAC's after the procedure but has maintained NSR. Did have a brief tachycardia this AM lasting less than 5 seconds. Remains on Toprol-XL 50mg  daily.  - Given his CHA2DS2-VASc Score of 5, he has been started on Eliquis 5mg  BID for anticoagulation.  Hgb and platelet counts will need to be followed closely as an outpatient (Hgb at 8.9 and platelets at 160 K yesterday).    2. HFrEF - EF previously normal by echo in 2022 but reduced to 35-40% by repeat imaging this admission and at 30-35% by TEE. Likely nonischemic and due to either tachycardia or Opdivo. - BP has been soft at 93/65 - 116/69. Remains on Toprol-XL 50mg  daily. Can perhaps add low-dose Spironolactone or an SGLT2 inhibitor prior to discharge and will review with Dr. Debara Pickett. Plans are for a repeat echo in 1 month since this will impact his chemotherapy regimen as discussed below.    3. CAD - History of PTCA to D1 in 2007 with no recent interventions. 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 Would need to arrange for ischemic evaluation if this remains reduced over time.  - Continue Atorvastatin 80mg  daily and Toprol-XL 50mg  daily. ASA has been discontinued given the initiation of Eliquis.   4. AKI - Creatinine peaked at 1.51 this admission and had improved to 1.11 on most recent check on 09/19/2021.   5. Stage 2 Lung Cancer - Being followed by Oncology as an outpatient. Dr. Phineas Inches recommended holding his Opdivo until there is evidence of EF recovery with plans for follow-up echo in 1 month.     Will arrange for a follow-up visit with Dr. Percival Spanish or APP in several weeks for possible medication titration and he will need a repeat echocardiogram in 1 month.    For questions or updates, please contact [place] Please consult [email or web address] for contact info under        Signed, Erma Heritage, PA-C  09/21/2021, 8:11 AM

## 2021-09-22 ENCOUNTER — Other Ambulatory Visit: Payer: Self-pay

## 2021-09-24 ENCOUNTER — Other Ambulatory Visit: Payer: Self-pay | Admitting: Hematology

## 2021-09-24 DIAGNOSIS — C3411 Malignant neoplasm of upper lobe, right bronchus or lung: Secondary | ICD-10-CM

## 2021-09-25 ENCOUNTER — Inpatient Hospital Stay: Payer: Medicare Other

## 2021-09-25 ENCOUNTER — Inpatient Hospital Stay: Payer: Medicare Other | Admitting: Hematology

## 2021-09-25 ENCOUNTER — Inpatient Hospital Stay: Payer: Medicare Other | Attending: Hematology

## 2021-09-25 DIAGNOSIS — F17210 Nicotine dependence, cigarettes, uncomplicated: Secondary | ICD-10-CM | POA: Diagnosis not present

## 2021-09-25 DIAGNOSIS — I10 Essential (primary) hypertension: Secondary | ICD-10-CM | POA: Diagnosis not present

## 2021-09-25 DIAGNOSIS — Z801 Family history of malignant neoplasm of trachea, bronchus and lung: Secondary | ICD-10-CM | POA: Diagnosis not present

## 2021-09-25 DIAGNOSIS — I714 Abdominal aortic aneurysm, without rupture, unspecified: Secondary | ICD-10-CM | POA: Diagnosis not present

## 2021-09-25 DIAGNOSIS — I2510 Atherosclerotic heart disease of native coronary artery without angina pectoris: Secondary | ICD-10-CM | POA: Diagnosis not present

## 2021-09-25 DIAGNOSIS — C3411 Malignant neoplasm of upper lobe, right bronchus or lung: Secondary | ICD-10-CM | POA: Diagnosis not present

## 2021-09-25 DIAGNOSIS — I4891 Unspecified atrial fibrillation: Secondary | ICD-10-CM | POA: Insufficient documentation

## 2021-09-25 DIAGNOSIS — Z8673 Personal history of transient ischemic attack (TIA), and cerebral infarction without residual deficits: Secondary | ICD-10-CM | POA: Diagnosis not present

## 2021-09-25 DIAGNOSIS — E785 Hyperlipidemia, unspecified: Secondary | ICD-10-CM | POA: Insufficient documentation

## 2021-09-25 DIAGNOSIS — Z7952 Long term (current) use of systemic steroids: Secondary | ICD-10-CM | POA: Diagnosis not present

## 2021-09-25 DIAGNOSIS — Z79899 Other long term (current) drug therapy: Secondary | ICD-10-CM | POA: Insufficient documentation

## 2021-09-25 DIAGNOSIS — I252 Old myocardial infarction: Secondary | ICD-10-CM | POA: Insufficient documentation

## 2021-09-25 DIAGNOSIS — Z5111 Encounter for antineoplastic chemotherapy: Secondary | ICD-10-CM | POA: Insufficient documentation

## 2021-09-25 DIAGNOSIS — M7989 Other specified soft tissue disorders: Secondary | ICD-10-CM | POA: Diagnosis not present

## 2021-09-25 LAB — CBC WITH DIFFERENTIAL/PLATELET
Abs Immature Granulocytes: 0.07 10*3/uL (ref 0.00–0.07)
Basophils Absolute: 0 10*3/uL (ref 0.0–0.1)
Basophils Relative: 0 %
Eosinophils Absolute: 0 10*3/uL (ref 0.0–0.5)
Eosinophils Relative: 0 %
HCT: 27.4 % — ABNORMAL LOW (ref 39.0–52.0)
Hemoglobin: 9 g/dL — ABNORMAL LOW (ref 13.0–17.0)
Immature Granulocytes: 1 %
Lymphocytes Relative: 13 %
Lymphs Abs: 1.3 10*3/uL (ref 0.7–4.0)
MCH: 31.5 pg (ref 26.0–34.0)
MCHC: 32.8 g/dL (ref 30.0–36.0)
MCV: 95.8 fL (ref 80.0–100.0)
Monocytes Absolute: 0.8 10*3/uL (ref 0.1–1.0)
Monocytes Relative: 9 %
Neutro Abs: 7.6 10*3/uL (ref 1.7–7.7)
Neutrophils Relative %: 77 %
Platelets: 178 10*3/uL (ref 150–400)
RBC: 2.86 MIL/uL — ABNORMAL LOW (ref 4.22–5.81)
RDW: 17.7 % — ABNORMAL HIGH (ref 11.5–15.5)
WBC: 9.8 10*3/uL (ref 4.0–10.5)
nRBC: 0 % (ref 0.0–0.2)

## 2021-09-25 LAB — COMPREHENSIVE METABOLIC PANEL
ALT: 16 U/L (ref 0–44)
AST: 17 U/L (ref 15–41)
Albumin: 3.1 g/dL — ABNORMAL LOW (ref 3.5–5.0)
Alkaline Phosphatase: 55 U/L (ref 38–126)
Anion gap: 5 (ref 5–15)
BUN: 14 mg/dL (ref 8–23)
CO2: 23 mmol/L (ref 22–32)
Calcium: 8.7 mg/dL — ABNORMAL LOW (ref 8.9–10.3)
Chloride: 110 mmol/L (ref 98–111)
Creatinine, Ser: 1.04 mg/dL (ref 0.61–1.24)
GFR, Estimated: >60 mL/min (ref >60)
Glucose, Bld: 120 mg/dL — ABNORMAL HIGH (ref 70–99)
Potassium: 4.5 mmol/L (ref 3.5–5.1)
Sodium: 138 mmol/L (ref 135–145)
Total Bilirubin: 0.6 mg/dL (ref 0.3–1.2)
Total Protein: 6.2 g/dL — ABNORMAL LOW (ref 6.5–8.1)

## 2021-09-25 LAB — MAGNESIUM: Magnesium: 1.8 mg/dL (ref 1.7–2.4)

## 2021-09-25 MED ORDER — SODIUM CHLORIDE 0.9% FLUSH
10.0000 mL | Freq: Once | INTRAVENOUS | Status: AC
  Administered 2021-09-25: 10 mL via INTRAVENOUS

## 2021-09-25 MED ORDER — HEPARIN SOD (PORK) LOCK FLUSH 100 UNIT/ML IV SOLN
500.0000 [IU] | Freq: Once | INTRAVENOUS | Status: AC
  Administered 2021-09-25: 500 [IU] via INTRAVENOUS

## 2021-09-25 NOTE — Progress Notes (Signed)
Patient see today by MD. No treatment per MD today.

## 2021-09-25 NOTE — Progress Notes (Signed)
No Opdivo per Dr Delton Coombes in treatments - confirmed removal by him.  T.O. Dr Rhys Martini, PharmD

## 2021-09-25 NOTE — Patient Instructions (Addendum)
[place] [facility] Discharge Instructions   You were seen and examined today by Dr. Delton Coombes.  He reviewed the results of your lab work which are normal/stable.   We will hold treatment for another week to give you a chance recover from your hospitalization. We will plan to give you treatment next week.   Return as scheduled.    Thank you for choosing [place] at [facility] to provide your oncology and hematology care.  To afford each patient quality time with our provider, please arrive at least 15 minutes before your scheduled appointment time.   If you have a lab appointment with the [facility] please come in thru the Main Entrance and check in at the main information desk.  You need to re-schedule your appointment should you arrive 10 or more minutes late.  We strive to give you quality time with our providers, and arriving late affects you and other patients whose appointments are after yours.  Also, if you no show three or more times for appointments you may be dismissed from the clinic at the providers discretion.     Again, thank you for choosing [facility].  Our hope is that these requests will decrease the amount of time that you wait before being seen by our physicians.       _____________________________________________________________  Should you have questions after your visit to [facility], please contact our office at [phone] and follow the prompts.  Our office hours are 8:00 a.m. and 4:30 p.m. Monday - Friday.  Please note that voicemails left after 4:00 p.m. may not be returned until the following business day.  We are closed weekends and major holidays.  You do have access to a nurse 24-7, just call the main number to the clinic [phone] and do not press any options, hold on the line and a nurse will answer the phone.    For prescription refill requests, have your pharmacy  contact our office and allow 72 hours.    Due to Covid, you will need to wear a mask upon entering the hospital. If you do not have a mask, a mask will be given to you at the Main Entrance upon arrival. For doctor visits, patients may have 1 support person age 21 or older with them. For treatment visits, patients can not have anyone with them due to social distancing guidelines and our immunocompromised population.

## 2021-09-25 NOTE — Progress Notes (Signed)
[address], [place] [address]   CLINIC:  Medical Oncology/Hematology  PCP:  [name], NP [name] 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  PRIOR THERAPY: none  NGS Results: not done  CURRENT THERAPY: Neoadjuvant chemoimmunotherapy  BRIEF ONCOLOGIC HISTORY:  Oncology History  Primary adenocarcinoma of upper lobe of right lung ([name])  06/26/2021 Initial Diagnosis   Primary adenocarcinoma of upper lobe of right lung ([place])   08/06/2021 - 08/27/2021 Chemotherapy   Patient is on Treatment Plan : LUNG NSCLC Pemetrexed + Carboplatin q21d x 4 Cycles     08/06/2021 -  Chemotherapy   Patient is on Treatment Plan : LUNG NSCLC Pemetrexed + Carboplatin q21d x 4 Cycles       CANCER STAGING:  Cancer Staging  Primary adenocarcinoma of upper lobe of right lung ([place]) Staging form: Lung, AJCC 8th Edition - Clinical stage from 06/26/2021: cT1, cN1, cM0 - Unsigned   INTERVAL HISTORY:  Mr. RAMZEY PETROVIC, a 76 y.o. male, seen for follow-up of right lung cancer.  He has completed 2 cycles of chemotherapy.  He was admitted to the hospital on 09/17/2021 for A-fib with RVR and discharged on 09/21/2021.  He was started on Eliquis which she is tolerating very well.  Does not have any shortness of breath on exertion, palpitations or chest pains.  Energy levels have improved to 75%.  REVIEW OF SYSTEMS:  Review of Systems  All other systems reviewed and are negative.   PAST MEDICAL/SURGICAL HISTORY:  Past Medical History:  Diagnosis Date   AAA (abdominal aortic aneurysm) (HCC)    Arthritis    Carotid artery disease ([place])    Nonobstructive   Cataract    Coronary atherosclerosis of native coronary artery    PTCA small diagonal 2007 otherwise nonobstructive CAD, EF 50-55%   Essential hypertension, benign    Hyperlipidemia    NSTEMI (non-ST elevated myocardial infarction) ([place])  2007   Stroke [facility] 2004   TIA (transient ischemic attack) 2006   Past Surgical History:  Procedure Laterality Date   AORTA - BILATERAL FEMORAL ARTERY BYPASS GRAFT  01/08/2012   Procedure: AORTA BIFEMORAL BYPASS GRAFT;  Surgeon: Elam Dutch, MD;  Location: [facility] OR;  Service: Vascular;  Laterality: Bilateral;  using 18x37m x 40cm Hemashield Gold Vascular Graft with Endarterectomy, Thombectomy and  Reimplantation of Inferior Mesenteric Artery   BRONCHIAL BIOPSY  06/11/2021   Procedure: BRONCHIAL BIOPSIES;  Surgeon: BCollene Gobble MD;  [facility];  Service: Pulmonary;;   BRONCHIAL BRUSHINGS  06/11/2021   Procedure: BRONCHIAL BRUSHINGS;  Surgeon: BCollene Gobble MD;  [facility];  Service: Pulmonary;;   BRONCHIAL NEEDLE ASPIRATION BIOPSY  06/11/2021   Procedure: BRONCHIAL NEEDLE ASPIRATION BIOPSIES;  Surgeon: BCollene Gobble MD;  [facility];  Service: Pulmonary;;   COLONOSCOPY N/A 04/14/2019   Procedure: COLONOSCOPY;  Surgeon: RDaneil Dolin MD;  Location: [facility] ENDO SUITE;  Service: Endoscopy;  Laterality: N/A;  9:30   COLONOSCOPY WITH PROPOFOL N/A 07/25/2021   Procedure: COLONOSCOPY WITH PROPOFOL;  Surgeon: CEloise Harman DO;  Location[facility];  Service: Endoscopy;  Laterality: N/A;  1:00pm   FIDUCIAL MARKER PLACEMENT  06/11/2021   Procedure: FIDUCIAL MARKER PLACEMENT;  Surgeon: BCollene Gobble MD;  [facility];  Service: Pulmonary;;   IR IMAGING GUIDED PORT INSERTION  07/31/2021   Left  cataract surgery     POLYPECTOMY  04/14/2019   Procedure: POLYPECTOMY;  Surgeon: Daneil Dolin, MD;  [facility];  Service: Endoscopy;;   POLYPECTOMY  07/25/2021   Procedure: POLYPECTOMY;  Surgeon: Eloise Harman, DO;  [facility];  Service: Endoscopy;;   TRANSFORAMINAL LUMBAR INTERBODY FUSION (TLIF) WITH PEDICLE SCREW FIXATION 1 LEVEL N/A 04/27/2020   Procedure: Transforaminal Lumbar Interbody Fusion Lumbar Five-Sacral One;   Surgeon: Vallarie Mare, MD;  Location: [facility];  Service: Neurosurgery;  Laterality: N/A;   VIDEO BRONCHOSCOPY WITH RADIAL ENDOBRONCHIAL ULTRASOUND  06/11/2021   Procedure: VIDEO BRONCHOSCOPY WITH RADIAL ENDOBRONCHIAL ULTRASOUND;  Surgeon: [name], MD;  Location: [facility] ENDOSCOPY;  Service: Pulmonary;;    SOCIAL HISTORY:  Social History   Socioeconomic History   Marital status: Divorced    Spouse name: Not on file   Number of children: 1   Years of education: 11   Highest education level: 11th grade  Occupational History    Employer: Probation officer  Tobacco Use   Smoking status: Every Day    Packs/day: 1.00    Years: 40.00    Total pack years: 40.00    Types: Cigarettes   Smokeless tobacco: Never   Tobacco comments:    1 pack of cigarettes smoked daily. 07/17/21 ARJ, RN   Vaping Use   Vaping Use: Never used  Substance and Sexual Activity   Alcohol use: No    Comment: Prior history of regular alcohol use   Drug use: No   Sexual activity: Not Currently  Other Topics Concern   Not on file  Social History Narrative   Not on file   Social Determinants of Health   Financial Resource Strain: Low Risk  (11/29/2019)   Overall Financial Resource Strain (CARDIA)    Difficulty of Paying Living Expenses: Not hard at all  Food Insecurity: No Food Insecurity (11/29/2019)   Hunger Vital Sign    Worried About Running Out of Food in the Last Year: Never true    Ran Out of Food in the Last Year: Never true  Transportation Needs: No Transportation Needs (11/29/2019)   PRAPARE - Hydrologist (Medical): No    Lack of Transportation (Non-Medical): No  Physical Activity: Inactive (11/29/2019)   Exercise Vital Sign    Days of Exercise per Week: 0 days    Minutes of Exercise per Session: 0 min  Stress: No Stress Concern Present (11/29/2019)   [place]    Feeling of Stress : Only a little   Social Connections: Moderately Isolated (11/29/2019)   Social Connection and Isolation Panel [NHANES]    Frequency of Communication with Friends and Family: More than three times a week    Frequency of Social Gatherings with Friends and Family: More than three times a week    Attends Religious Services: 1 to 4 times per year    Active Member of [employer] or Organizations: No    Attends Archivist Meetings: Never    Marital Status: Divorced  Human resources officer Violence: Not At Risk (11/20/2018)   Humiliation, Afraid, Rape, and Kick questionnaire    Fear of Current or Ex-Partner: No    Emotionally Abused: No    Physically Abused: No    Sexually Abused: No    FAMILY HISTORY:  Family History  Problem Relation Age of Onset   Hyperlipidemia Sister    Heart attack Brother  52   Cancer - Colon Neg Hx     CURRENT MEDICATIONS:  Current Outpatient Medications  Medication Sig Dispense Refill   apixaban (ELIQUIS) 5 MG TABS tablet Take 1 tablet (5 mg total) by mouth 2 (two) times daily. 60 tablet 2   atorvastatin (LIPITOR) 80 MG tablet Take 80 mg by mouth daily.     CARBOPLATIN IV Inject into the vein every 21 ( twenty-one) days.     cyclobenzaprine (FLEXERIL) 10 MG tablet Take 10 mg by mouth 2 (two) times daily as needed for muscle spasms.     folic acid (FOLVITE) 1 MG tablet Take 1 tablet (1 mg total) by mouth daily. 90 tablet 2   metoprolol succinate (TOPROL-XL) 50 MG 24 hr tablet Take 1 tablet (50 mg total) by mouth daily. 30 tablet 2   Nivolumab (OPDIVO IV) Inject into the vein.     PEMETREXED IV Inject into the vein every 21 ( twenty-one) days.     predniSONE (DELTASONE) 10 MG tablet Take 1 tablet (10 mg total) by mouth daily with breakfast. 30 tablet 0   No current facility-administered medications for this visit.    ALLERGIES:  No Known Allergies  PHYSICAL EXAM:  Performance status (ECOG): 1 - Symptomatic but completely ambulatory  There were no vitals filed for this  visit.  Wt Readings from Last 3 Encounters:  09/25/21 163 lb (73.9 kg)  09/20/21 156 lb 14.4 oz (71.2 kg)  09/17/21 156 lb 14.4 oz (71.2 kg)   Physical Exam Vitals reviewed.  Constitutional:      Appearance: Normal appearance.  Cardiovascular:     Rate and Rhythm: Normal rate and regular rhythm.     Pulses: Normal pulses.     Heart sounds: Normal heart sounds.  Pulmonary:     Effort: Pulmonary effort is normal.     Breath sounds: Normal breath sounds.  Neurological:     General: No focal deficit present.     Mental Status: He is alert and oriented to person, place, and time.  Psychiatric:        Mood and Affect: Mood normal.        Behavior: Behavior normal.      LABORATORY DATA:  I have reviewed the labs as listed.     Latest Ref Rng & Units 09/25/2021    8:13 AM 09/20/2021    5:07 AM 09/19/2021    4:10 AM  CBC  WBC 4.0 - 10.5 K/uL 9.8  7.6  5.0   Hemoglobin 13.0 - 17.0 g/dL 9.0  8.9  8.9   Hematocrit 39.0 - 52.0 % 27.4  27.1  26.4   Platelets 150 - 400 K/uL 178  160  142       Latest Ref Rng & Units 09/25/2021    8:13 AM 09/19/2021    4:10 AM 09/18/2021    8:20 AM  CMP  Glucose 70 - 99 mg/dL 120  95  90   BUN 8 - 23 mg/dL 14  15  17    Creatinine 0.61 - 1.24 mg/dL 1.04  1.11  1.22   Sodium 135 - 145 mmol/L 138  139  140   Potassium 3.5 - 5.1 mmol/L 4.5  4.0  3.7   Chloride 98 - 111 mmol/L 110  110  109   CO2 22 - 32 mmol/L 23  24  26    Calcium 8.9 - 10.3 mg/dL 8.7  8.5  8.2   Total Protein 6.5 - 8.1 g/dL 6.2  Total Bilirubin 0.3 - 1.2 mg/dL 0.6     Alkaline Phos 38 - 126 U/L 55     AST 15 - 41 U/L 17     ALT 0 - 44 U/L 16       DIAGNOSTIC IMAGING:  I have independently reviewed the scans and discussed with the patient. ECHO TEE  Result Date: 09/20/2021    TRANSESOPHOGEAL ECHO REPORT   Patient Name:   JAHLIL ZILLER Date of Exam: 09/20/2021 Medical Rec #:  [id number]        Height:       70.0 in Accession #:    [id number]       Weight:       156.9 lb Date  of Birth:  04-12-1945         BSA:          1.883 m Patient Age:    20 years         BP:           89/58 mmHg Patient Gender: M                HR:           108 bpm. Exam Location:  [facility] Procedure: Transesophageal Echo Indications:    Atrial Fibrillation  History:        Patient has prior history of Echocardiogram examinations, most                 recent 09/17/2021. CHF, CAD and Previous Myocardial Infarction,                 COPD, Arrythmias:Atrial Fibrillation; Risk Factors:Dyslipidemia                 and Current Smoker.  Sonographer:    Wenda Low Referring Phys: [id number] [place]: TEE procedure time was 9 minutes. The transesophogeal probe was passed without difficulty through the esophogus of the patient. Imaged were obtained with the patient in a left lateral decubitus position. Sedation performed by different physician. The patient was monitored while under deep sedation. Image quality was good. The patient developed no complications during the procedure. IMPRESSIONS  1. Limited study in terms of color Doppler and zooming capabilies of echocardiographic work station.  2. Left ventricular ejection fraction, by estimation, is 30 to 35%. The left ventricle has moderately decreased function. The left ventricle demonstrates global hypokinesis.  3. Right ventricular systolic function is mildly reduced. The right ventricular size is normal.  4. Left atrial size was moderately dilated. No left atrial/left atrial appendage thrombus was detected.  5. Right atrial size was moderately dilated.  6. The mitral valve is grossly normal. Mild mitral valve regurgitation.  7. Tricuspid valve regurgitation is mild to moderate.  8. The aortic valve is tricuspid. Aortic valve regurgitation is not visualized.  9. There is Moderate (Grade III) atheroma plaque involving the descending aorta. FINDINGS  Left Ventricle: Left ventricular ejection fraction, by estimation, is 30 to 35%. The left ventricle has  moderately decreased function. The left ventricle demonstrates global hypokinesis. The left ventricular internal cavity size was normal in size. Right Ventricle: The right ventricular size is normal. No increase in right ventricular wall thickness. Right ventricular systolic function is mildly reduced. Left Atrium: Left atrial size was moderately dilated. No left atrial/left atrial appendage thrombus was detected. Right Atrium: Right atrial size was moderately dilated. Pericardium: There is no evidence of pericardial effusion. Mitral Valve: The mitral valve  is grossly normal. Mild mitral valve regurgitation. Tricuspid Valve: The tricuspid valve is grossly normal. Tricuspid valve regurgitation is mild to moderate. Aortic Valve: The aortic valve is tricuspid. Aortic valve regurgitation is not visualized. Pulmonic Valve: The pulmonic valve was grossly normal. Pulmonic valve regurgitation is mild. Aorta: The aortic root is normal in size and structure. There is moderate (Grade III) atheroma plaque involving the descending aorta. IAS/Shunts: The interatrial septum was not assessed. Rozann Lesches MD Electronically signed by Rozann Lesches MD Signature Date/Time: 09/20/2021/11:36:12 AM    Final    ECHOCARDIOGRAM COMPLETE  Result Date: 09/17/2021    ECHOCARDIOGRAM REPORT   Patient Name:   KESTON SEEVER Date of Exam: 09/17/2021 Medical Rec #:  [id number]        Height:       70.0 in Accession #:    [id number]       Weight:       156.9 lb Date of Birth:  13-Oct-1945         BSA:          1.883 m Patient Age:    37 years         BP:           114/75 mmHg Patient Gender: M                HR:           96 bpm. Exam Location:  [facility] Procedure: 2D Echo, Cardiac Doppler and Color Doppler Indications:     Atrial Fibrillation I48.91  History:         Patient has prior history of Echocardiogram examinations, most                  recent 02/05/2020. COPD and Stroke; Risk Factors:Hypertension                  and Dyslipidemia.  Primary adenocarcinoma of upper lobe of right                  lung ([place]), Tobacco abuse, NSTEMI (non-ST elevated myocardial                  infarction) ([place]) (From Hx).  Sonographer:     Alvino Chapel RCS Referring Phys:  [id number] Royanne Foots [facility] Diagnosing Phys: Candee Furbish MD IMPRESSIONS  1. Left ventricular ejection fraction, by estimation, is 35 to 40%. The left ventricle has moderately decreased function. The left ventricle demonstrates global hypokinesis. Left ventricular diastolic parameters are indeterminate.  2. Right ventricular systolic function is normal. The right ventricular size is normal. There is normal pulmonary artery systolic pressure. The estimated right ventricular systolic pressure is 79.8 mmHg.  3. Left atrial size was moderately dilated.  4. The mitral valve is normal in structure. Mild mitral valve regurgitation. No evidence of mitral stenosis.  5. Tricuspid valve regurgitation is moderate.  6. The aortic valve is normal in structure. Aortic valve regurgitation is not visualized. No aortic stenosis is present.  7. The inferior vena cava is dilated in size with >50% respiratory variability, suggesting right atrial pressure of 8 mmHg. Comparison(s): Prior images reviewed side by side. The left ventricular function is worsened. Prior EF 60%. FINDINGS  Left Ventricle: Left ventricular ejection fraction, by estimation, is 35 to 40%. The left ventricle has moderately decreased function. The left ventricle demonstrates global hypokinesis. The left ventricular internal cavity size was normal in size. There is no left ventricular hypertrophy. Left ventricular  diastolic parameters are indeterminate. Right Ventricle: The right ventricular size is normal. No increase in right ventricular wall thickness. Right ventricular systolic function is normal. There is normal pulmonary artery systolic pressure. The tricuspid regurgitant velocity is 2.30 m/s, and  with an assumed right atrial pressure of 8 mmHg,  the estimated right ventricular systolic pressure is 16.1 mmHg. Left Atrium: Left atrial size was moderately dilated. Right Atrium: Right atrial size was normal in size. Pericardium: There is no evidence of pericardial effusion. Mitral Valve: The mitral valve is normal in structure. Mild mitral valve regurgitation. No evidence of mitral valve stenosis. MV peak gradient, 3.8 mmHg. The mean mitral valve gradient is 1.0 mmHg. Tricuspid Valve: The tricuspid valve is normal in structure. Tricuspid valve regurgitation is moderate . No evidence of tricuspid stenosis. Aortic Valve: The aortic valve is normal in structure. Aortic valve regurgitation is not visualized. No aortic stenosis is present. Pulmonic Valve: The pulmonic valve was normal in structure. Pulmonic valve regurgitation is not visualized. No evidence of pulmonic stenosis. Aorta: The aortic root is normal in size and structure. Venous: The inferior vena cava is dilated in size with greater than 50% respiratory variability, suggesting right atrial pressure of 8 mmHg. IAS/Shunts: No atrial level shunt detected by color flow Doppler.  LEFT VENTRICLE PLAX 2D LVIDd:         4.80 cm LVIDs:         3.70 cm LV PW:         0.90 cm LV IVS:        0.90 cm LVOT diam:     2.20 cm LV SV:         56 LV SV Index:   30 LVOT Area:     3.80 cm  RIGHT VENTRICLE TAPSE (M-mode): 1.6 cm LEFT ATRIUM             Index        RIGHT ATRIUM           Index LA diam:        4.20 cm 2.23 cm/m   RA Area:     18.00 cm LA Vol (A2C):   90.3 ml 47.96 ml/m  RA Volume:   40.80 ml  21.67 ml/m LA Vol (A4C):   68.9 ml 36.60 ml/m LA Biplane Vol: 79.7 ml 42.33 ml/m  AORTIC VALVE LVOT Vmax:   72.50 cm/s LVOT Vmean:  49.000 cm/s LVOT VTI:    0.148 m  AORTA Ao Root diam: 4.00 cm MITRAL VALVE               TRICUSPID VALVE MV Area (PHT): 4.60 cm    TR Peak grad:   21.2 mmHg MV Area VTI:   2.20 cm    TR Vmax:        230.00 cm/s MV Peak grad:  3.8 mmHg MV Mean grad:  1.0 mmHg    SHUNTS MV Vmax:        0.97 m/s    Systemic VTI:  0.15 m MV Vmean:      40.0 cm/s   Systemic Diam: 2.20 cm MV Decel Time: 165 msec MV E velocity: 91.70 cm/s Candee Furbish MD Electronically signed by Candee Furbish MD Signature Date/Time: 09/17/2021/4:49:21 PM    Final (Updated)    DG Chest 2 View  Result Date: 09/17/2021 CLINICAL DATA:  Shortness of breath and dizziness. EXAM: CHEST - 2 VIEW COMPARISON:  Chest radiograph 06/11/2021 FINDINGS: A left chest wall port is in place  with tip terminating at the cavoatrial junction. The cardiomediastinal silhouette is stable. There is no focal consolidation or pulmonary edema. There is no pleural effusion or pneumothorax. A fiducial marker is again seen in the right upper lobe with decreased surrounding density likely reflecting resolved post biopsy hemorrhage. There is no acute osseous abnormality. IMPRESSION: 1. Fiducial marker in place in the right upper lobe with decreased surrounding density likely reflecting resolved post biopsy hemorrhage. 2. No radiographic evidence of acute cardiopulmonary process. Electronically Signed   By: Valetta Mole M.D.   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  Occasional additional small pulmonary nodules measuring 0.4 cm and smaller. - Bronchoscopy (06/11/2021): RUL nodule brushing and FNA: Malignant cells with features of adenocarcinoma. - PET scan (06/22/2021): Subsolid nodular lesion in the right upper lobe, hypermetabolic SUV 15.  Small right hilar and infrahilar lymph nodes with SUV 4.03 concerning for metastatic adenopathy.  Small hypermetabolic left parotid gland lesion, consistent with previous history of Wharton's tumor.  Hypermetabolic focus in the transverse colon SUV 8.69. - MRI of the brain (07/11/2021): No evidence of metastatic disease.  Right sphenoid wing meningioma stable since  2022. - He was evaluated by Dr. Roxan Hockey and was recommended to undergo neoadjuvant chemoimmunotherapy followed by surgical resection. - Cycle 1 of carboplatin and pemetrexed on 08/06/2021. - NGS testing not performed due to insufficient sample. - [employer]: Negative for EGFR and ALK.  MSI high not detected. - Cycle 2 carboplatin, pemetrexed and opdivo on 08/27/2021.    Social/family history: - Lives at home with his wife.  Uses cane occasionally after he had stroke in January 2022.  He has retired after working in [employer].  He is current active smoker, 1 pack/day for 60 years.  No exposure to chemicals. - Brother died of metastatic cancer.  Maternal uncle had lung cancer.   PLAN:  Stage II (T1 N1 M0) right upper lobe adenocarcinoma: -He was hospitalized from 09/17/2021 through 09/21/2021 with atrial fibrillation with RVR. - He was started on Eliquis. - TEE was negative for atrial thrombus. - Troponins were normal.  LVEF was reduced to 35-40%, thought to be secondary to atrial fibrillation. - Likelihood of myocarditis from 1 dose of opdivo is low.  Troponin was also negative. - He has follow-up with Dr. Percival Spanish on 10/01/2021. - We will hold his chemotherapy today.  I have recommended cycle 3 chemotherapy next week if it is okay with cardiology.  Carboplatin and pemetrexed are not known to cause any cardiac toxicity.  We will hold his nivolumab for cycle 3.  The nadir platelet count while on chemotherapy was 57 K, which is okay while he is on Eliquis. - Labs today shows hemoglobin 9 and platelet count 178 with normal white count.  LFTs were normal. - Recommend CT chest with contrast after cycle 3 and follow-up with Dr. Roxan Hockey. - RTC 4 weeks with me to discuss results.  2.  Abdominal uptake on PET scan: - Colonoscopy on 04/14/2019 with multiple adenomatous polyps removed. - Uptake area appears to be in the transverse colon lesion.  Referral was made to Dr. Gala Romney.  3.  Lower  extremity swelling: - Continue Lasix as needed.   Orders placed this encounter:  Orders Placed This Encounter  Procedures   CBC with Differential   Comprehensive metabolic panel  Derek Jack, MD [place] [phone]

## 2021-09-26 ENCOUNTER — Other Ambulatory Visit: Payer: Self-pay

## 2021-09-27 ENCOUNTER — Encounter (HOSPITAL_COMMUNITY): Payer: Self-pay | Admitting: Cardiology

## 2021-09-28 IMAGING — MR MR HEAD W/ CM
2 of 4 series · 16 of 48 positions shown · IV contrast (Gadavist)
Comparison: Noncontrast brain MRI yesterday.

CLINICAL DATA: 75-year-old male with recent right side weakness and
gait instability. 2.3 cm right sphenoid wing meningioma on recent CT
and noncontrast brain MRI.

EXAM:
MRI HEAD WITH CONTRAST
TECHNIQUE: Multiplanar, multiecho pulse sequences of the brain and surrounding
structures were obtained with intravenous contrast.
CONTRAST:  8.5mL GADAVIST GADOBUTROL 1 MMOL/ML IV SOLN

[Series 7: T1 post-contrast · coronal · 5.0mm · 0.34mm/px · 9 of 32 slices shown (1 of 2)]
[im 1/32]
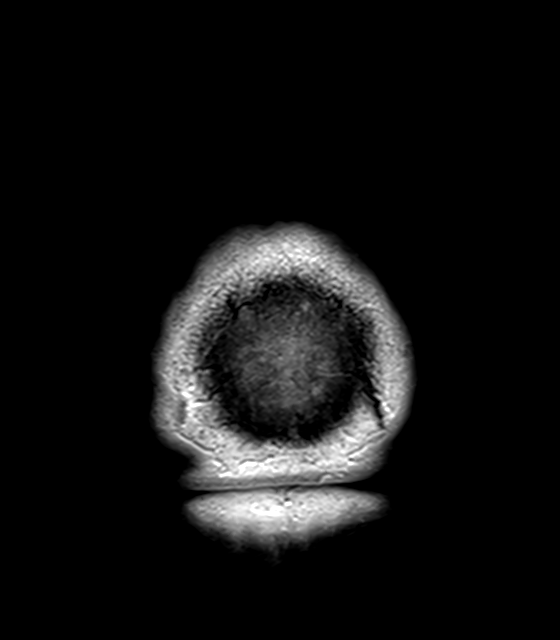
[im 4/32]
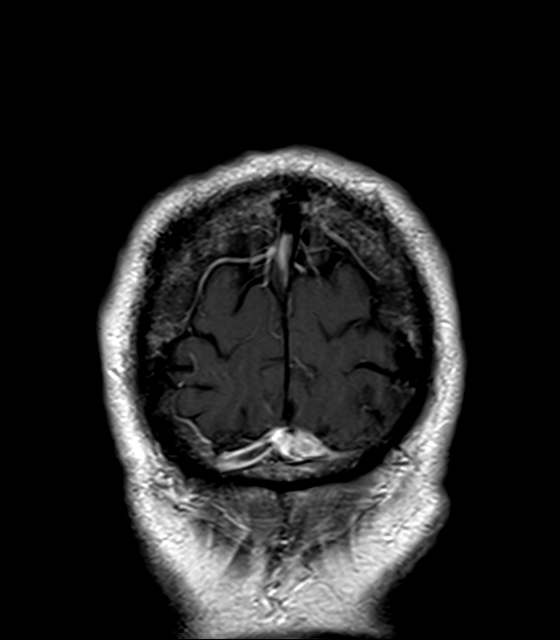
[im 8/32]
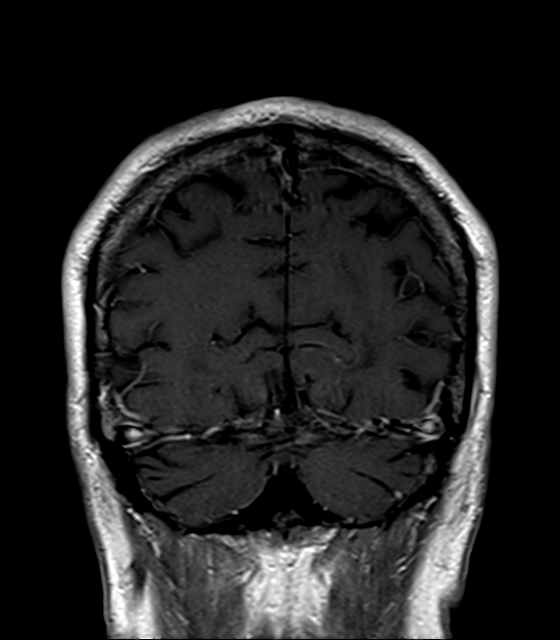
[im 12/32]
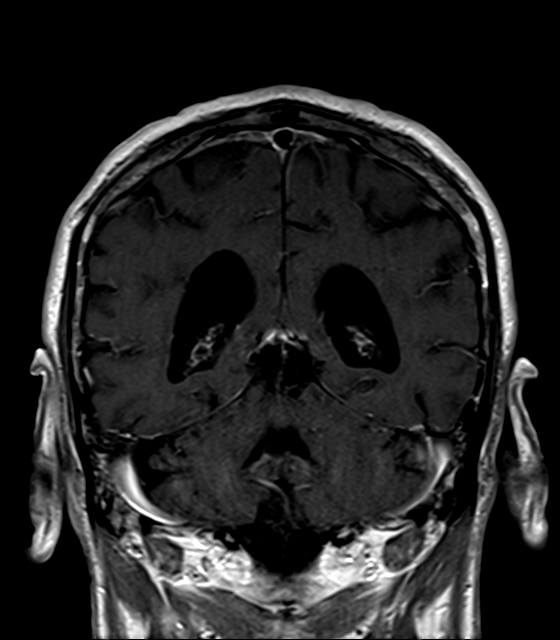
[im 16/32]
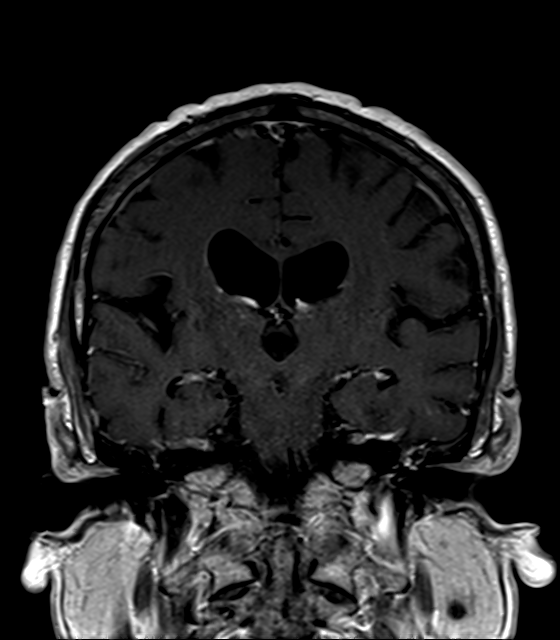
[im 20/32]
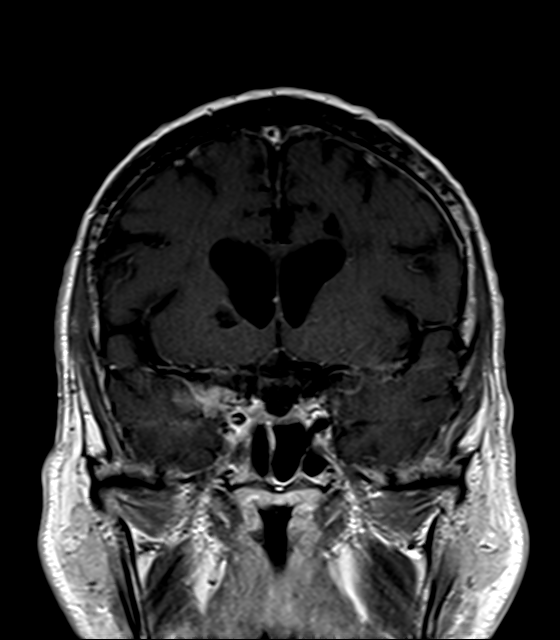
[im 24/32]
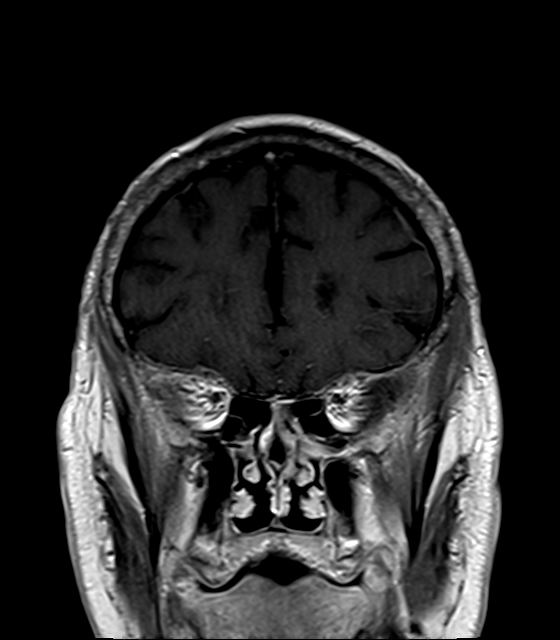
[im 28/32]
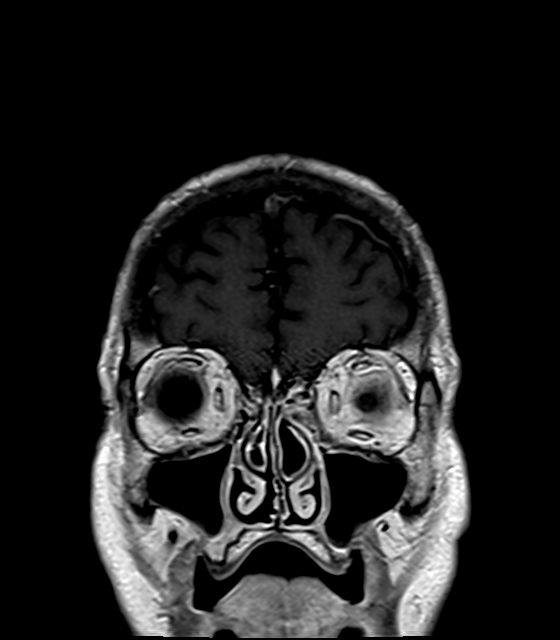
[im 32/32]
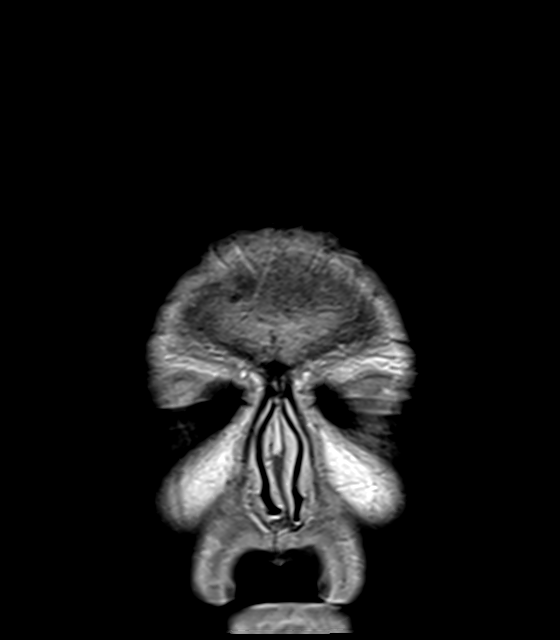

[Series 8: T1 post-contrast · sagittal · 5.0mm · 0.72mm/px · 7 of 25 slices shown (2 of 2)]
[im 1/25]
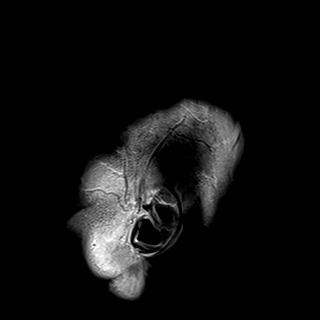
[im 5/25]
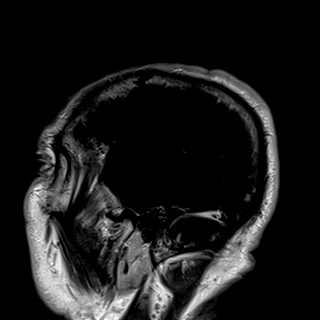
[im 9/25]
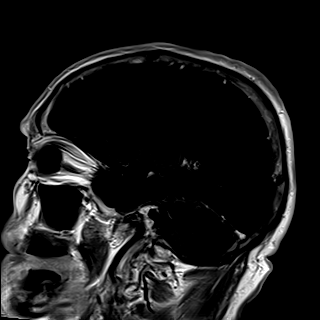
[im 13/25]
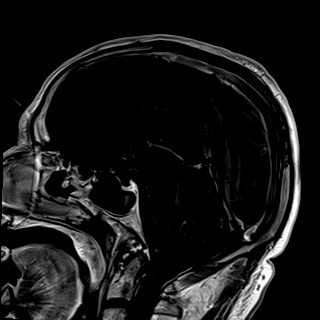
[im 17/25]
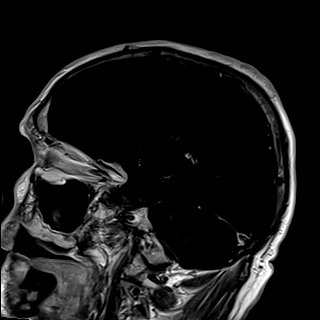
[im 21/25]
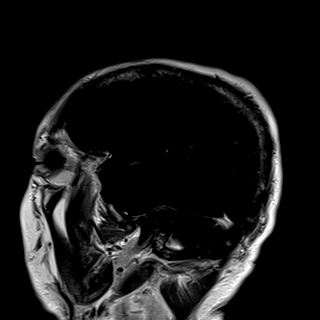
[im 25/25]
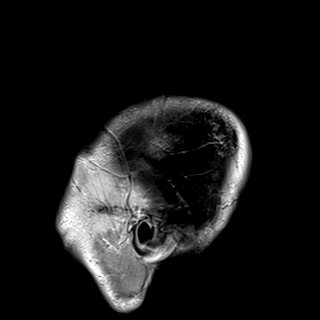

[16 of 48 positions shown; findings below may reference images not displayed]

FINDINGS: The major dural venous sinuses are enhancing and appear to be
patent.

Lobulated dural-based solidly enhancing extra-axial mass of the
right sphenoid wing corresponding to the noncontrast finding
yesterday encompasses 20 x 26 x 20 mm (AP by transverse by CC).
Regional dural thickening (series 6, image 25). Stable mild mass
effect on the adjacent right temporal lobe as before.

Relative sparing of the right cavernous sinus. The lesion abuts but
does not extend into the right orbital apex at this time.

No other abnormal intracranial enhancement or dural thickening
identified. T1 signal appearance of brain parenchyma otherwise
stable from yesterday.

Other findings: The small T2 hyperintense lesion of the left parotid
gland seen yesterday demonstrates little if any enhancement (series
7, image 16), benign cyst or benign mixed tumor of the parotid
favored.
IMPRESSION: 1. Homogeneously enhancing right sphenoid wing meningioma, up to
cm long axis. Relative sparing of the right cavernous sinus and no
extension into the right orbital apex at this time.

2. No other abnormal intracranial enhancement or dural thickening.

## 2021-09-30 ENCOUNTER — Other Ambulatory Visit: Payer: Self-pay

## 2021-09-30 DIAGNOSIS — I10 Essential (primary) hypertension: Secondary | ICD-10-CM | POA: Insufficient documentation

## 2021-09-30 DIAGNOSIS — N179 Acute kidney failure, unspecified: Secondary | ICD-10-CM | POA: Insufficient documentation

## 2021-09-30 NOTE — Progress Notes (Unsigned)
Cardiology Office Note   Date:  10/01/2021   ID:  Darryl Ward, DOB 10/25/1945, MRN [id number]  PCP:  [name], NP  Cardiologist:   Minus Breeding, MD Referring:  [name], NP  Chief Complaint  Patient presents with   Atrial Fibrillation      History of Present Illness: Darryl Ward is a 76 y.o. male who is referred by  presents for preop clearance prior to back surgery.   He is referred by [name], NP  I saw him back in 2007.  He was seeing Dr. Domenic Polite.   He had a stress perfusion study prior to aortobifem bypass that occurred in 2013.    He was admitted in January 2021 with questionable stroke.  There were no acute findings.  2D echo was essentially unremarkable with a normal ejection fraction.  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  He had a left parotid tumor. This was biopsied.   There was a meningioma noted.   He has non-small cell lung cancer and is being managed with pemetrexed and carboplatinum.  He was admitted in August with atrial fib.    He was treated with cardioversion.  He had Motrin added.  He has been on Eliquis.  He had a TEE cardioversion.  His ejection fraction was reduced to 35% which was new.  In retrospect he is very tired and dizzy when he was in atrial fibrillation but really did not feel palpitations.  Was not having any chest pain.  He did not have any syncope.  He has not had any new shortness of breath, PND or orthopnea.  He feels much better in sinus rhythm.  He has stopped smoking.   Past Medical History:  Diagnosis Date   AAA (abdominal aortic aneurysm) (HCC)    Arthritis    Carotid artery disease (HCC)    Nonobstructive   Cataract    Coronary atherosclerosis of native coronary artery    PTCA small diagonal 2007 otherwise nonobstructive CAD, EF 50-55%   Essential hypertension, benign    Hyperlipidemia    NSTEMI (non-ST elevated myocardial infarction) ([place]) 2007   Stroke [facility]) 2004   TIA (transient  ischemic attack) 2006    Past Surgical History:  Procedure Laterality Date   AORTA - BILATERAL FEMORAL ARTERY BYPASS GRAFT  01/08/2012   Procedure: AORTA BIFEMORAL BYPASS GRAFT;  Surgeon: Elam Dutch, MD;  Location: [facility] OR;  Service: Vascular;  Laterality: Bilateral;  using 18x51mm x 40cm Hemashield Gold Vascular Graft with Endarterectomy, Thombectomy and  Reimplantation of Inferior Mesenteric Artery   BRONCHIAL BIOPSY  06/11/2021   Procedure: BRONCHIAL BIOPSIES;  Surgeon: [name], MD;  Location: [facility] ENDOSCOPY;  Service: Pulmonary;;   BRONCHIAL BRUSHINGS  06/11/2021   Procedure: BRONCHIAL BRUSHINGS;  Surgeon: [name], MD;  Location: [facility];  Service: Pulmonary;;   BRONCHIAL NEEDLE ASPIRATION BIOPSY  06/11/2021   Procedure: BRONCHIAL NEEDLE ASPIRATION BIOPSIES;  Surgeon: [name], MD;  Location: [facility] ENDOSCOPY;  Service: Pulmonary;;   CARDIOVERSION N/A 09/20/2021   Procedure: CARDIOVERSION;  Surgeon: Satira Sark, MD;  Location: [facility] ORS;  Service: Cardiovascular;  Laterality: N/A;   COLONOSCOPY N/A 04/14/2019   Procedure: COLONOSCOPY;  Surgeon: Daneil Dolin, MD;  Location: [facility] ENDO SUITE;  Service: Endoscopy;  Laterality: N/A;  9:30   COLONOSCOPY WITH PROPOFOL N/A 07/25/2021   Procedure: COLONOSCOPY WITH PROPOFOL;  Surgeon: Eloise Harman, DO;  Location: [facility] END[facility] SU[facility];  Service: Endoscopy;  Laterality: N/A;  1:00pm   FIDUCIAL MARKER PLACEMENT  06/11/2021   Procedure: FIDUCIAL MARKER PLACEMENT;  Surgeon: [name], MD;  Location: [facility] ENDOSCOPY;  Service: Pulmonary;;   IR IMAGING GUIDED PORT INSERTION  07/31/2021   Left cataract surgery     POLYPECTOMY  04/14/2019   Procedure: POLYPECTOMY;  Surgeon: Daneil Dolin, MD;  Location: [facility];  Service: Endoscopy;;   POLYPECTOMY  07/25/2021   Procedure: POLYPECTOMY;  Surgeon: Eloise Harman, DO;  Location[facility];  Service: Endoscopy;;   TEE WITHOUT CARDIOVERSION N/A 09/20/2021   Procedure:  TRANSESOPHAGEAL ECHOCARDIOGRAM (TEE);  Surgeon: Satira Sark, MD;  Location: [facility] ORS;  Service: Cardiovascular;  Laterality: N/A;   TRANSFORAMINAL LUMBAR INTERBODY FUSION (TLIF) WITH PEDICLE SCREW FIXATION 1 LEVEL N/A 04/27/2020   Procedure: Transforaminal Lumbar Interbody Fusion Lumbar Five-Sacral One;  Surgeon: Vallarie Mare, MD;  Location: [facility];  Service: Neurosurgery;  Laterality: N/A;   VIDEO BRONCHOSCOPY WITH RADIAL ENDOBRONCHIAL ULTRASOUND  06/11/2021   Procedure: VIDEO BRONCHOSCOPY WITH RADIAL ENDOBRONCHIAL ULTRASOUND;  Surgeon: [name], MD;  Location: [facility] ENDOSCOPY;  Service: Pulmonary;;     Current Outpatient Medications  Medication Sig Dispense Refill   apixaban (ELIQUIS) 5 MG TABS tablet Take 1 tablet (5 mg total) by mouth 2 (two) times daily. 60 tablet 2   atorvastatin (LIPITOR) 80 MG tablet Take 80 mg by mouth daily.     CARBOPLATIN IV Inject into the vein every 21 ( twenty-one) days.     cyclobenzaprine (FLEXERIL) 10 MG tablet Take 10 mg by mouth 2 (two) times daily as needed for muscle spasms.     folic acid (FOLVITE) 1 MG tablet Take 1 tablet (1 mg total) by mouth daily. 90 tablet 2   metoprolol succinate (TOPROL-XL) 50 MG 24 hr tablet Take 1 tablet (50 mg total) by mouth daily. 30 tablet 2   Nivolumab (OPDIVO IV) Inject into the vein.     PEMETREXED IV Inject into the vein every 21 ( twenty-one) days.     predniSONE (DELTASONE) 10 MG tablet Take 1 tablet (10 mg total) by mouth daily with breakfast. 30 tablet 0   prochlorperazine (COMPAZINE) 10 MG tablet Take 10 mg by mouth every 6 (six) hours as needed for nausea or vomiting. Before quimio.     No current facility-administered medications for this visit.    Allergies:   Patient has no known allergies.   ROS:  Please see the history of present illness.   Otherwise, review of systems are positive for none.   All other systems are reviewed and negative.    PHYSICAL EXAM: VS:  BP 117/65   Pulse (!) 57   Ht  5\' 10"  (1.778 m)   Wt 165 lb 6.4 oz (75 kg)   SpO2 99%   BMI 23.73 kg/m  , BMI Body mass index is 23.73 kg/m. GENERAL: Slightly frail appearing NECK:  No jugular venous distention, waveform within normal limits, carotid upstroke brisk and symmetric, no bruits, no thyromegaly LUNGS:  Clear to auscultation bilaterally CHEST:  Unremarkable HEART:  PMI not displaced or sustained,S1 and S2 within normal limits, no S3, no S4, no clicks, no rubs, no murmurs ABD:  Flat, positive bowel sounds normal in frequency in pitch, no bruits, no rebound, no guarding, no midline pulsatile mass, no hepatomegaly, no splenomegaly EXT:  2 plus pulses throughout, no edema, no cyanosis no clubbing   EKG:  EKG  ordered today. The  ekg ordered today so demonstrates sinus rhythm, rate 57, axis within normal limits, intervals within normal limits, premature atrial contractions, no acute ST-T wave changes.  Atrial contractions Premature  Recent Labs: 09/17/2021: TSH 1.534 09/25/2021: ALT 16; BUN 14; Creatinine, Ser 1.04; Hemoglobin 9.0; Magnesium 1.8; Platelets 178; Potassium 4.5; Sodium 138    Lipid Panel    Component Value Date/Time   CHOL 202 (H) 02/05/2020 0755   CHOL 198 11/25/2019 1123   TRIG 99 02/05/2020 0755   HDL 29 (L) 02/05/2020 0755   HDL 36 (L) 11/25/2019 1123   CHOLHDL 7.0 02/05/2020 0755   VLDL 20 02/05/2020 0755   LDLCALC 153 (H) 02/05/2020 0755   LDLCALC 139 (H) 11/25/2019 1123      Wt Readings from Last 3 Encounters:  10/01/21 165 lb 6.4 oz (75 kg)  09/25/21 163 lb (73.9 kg)  09/20/21 156 lb 14.4 oz (71.2 kg)      Other studies Reviewed: Additional studies/ records that were reviewed today include: Hospital records. Review of the above records demonstrates:  Please see elsewhere in the note.     ASSESSMENT AND PLAN:  Symptomatic new onset atrial fibrillation with RVR: He is maintaining sinus rhythm.  They are going to get a pulse oximeter to follow heart rate.  He will continue  with anticoagulation.   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   AKI-resolved: Creatinine was 1.22 which was his baseline at discharge.  History of CAD with prior MI: He has no ongoing anginal symptoms.  Continue with meds as listed.   Prior CVA/PVD: He will continue with his statin.   Chronic anemia/thrombocytopenia: Per heme-onc   Hypertension: The blood pressure is running a little bit low as he is lost weight and he is now on beta-blocker.  Note med titration.   Stage II lung adenocarcinoma: I told him it is okay to go ahead with his chemotherapy.  We will certainly watch his rhythm.  He is going to have probably a lung resection in about 3 months but I will see him before then.  Tobacco abuse:   He quit smoking about 5 weeks ago.    Current medicines are reviewed at length with the patient today.  The patient does not have concerns regarding medicines.  The following changes have been made: None  Labs/ tests ordered today include: None  Orders Placed This Encounter  Procedures   EKG 12-Lead     Disposition:   FU with me in 2 months   Signed, Minus Breeding, MD  10/01/2021 12:16 PM    [facility]

## 2021-10-01 ENCOUNTER — Ambulatory Visit: Payer: Medicare Other | Attending: Cardiology | Admitting: Cardiology

## 2021-10-01 ENCOUNTER — Encounter: Payer: Self-pay | Admitting: Cardiology

## 2021-10-01 VITALS — BP 117/65 | HR 57 | Ht 70.0 in | Wt 165.4 lb

## 2021-10-01 DIAGNOSIS — I10 Essential (primary) hypertension: Secondary | ICD-10-CM | POA: Diagnosis not present

## 2021-10-01 DIAGNOSIS — I2510 Atherosclerotic heart disease of native coronary artery without angina pectoris: Secondary | ICD-10-CM | POA: Diagnosis not present

## 2021-10-01 DIAGNOSIS — I5041 Acute combined systolic (congestive) and diastolic (congestive) heart failure: Secondary | ICD-10-CM | POA: Diagnosis not present

## 2021-10-01 DIAGNOSIS — I4891 Unspecified atrial fibrillation: Secondary | ICD-10-CM

## 2021-10-01 DIAGNOSIS — N179 Acute kidney failure, unspecified: Secondary | ICD-10-CM

## 2021-10-01 NOTE — Patient Instructions (Signed)
Medication Instructions:  Your physician recommends that you continue on your current medications as directed. Please refer to the Current Medication list given to you today.  *If you need a refill on your cardiac medications before your next appointment, please call your pharmacy*  Follow-Up: At [facility], you and your health needs are our priority.  As part of our continuing mission to provide you with exceptional heart care, we have created designated Provider Care Teams.  These Care Teams include your primary Cardiologist (physician) and Advanced Practice Providers (APPs -  Physician Assistants and Nurse Practitioners) who all work together to provide you with the care you need, when you need it.  We recommend signing up for the patient portal called "MyChart".  Sign up information is provided on this After Visit Summary.  MyChart is used to connect with patients for Virtual Visits (Telemedicine).  Patients are able to view lab/test results, encounter notes, upcoming appointments, etc.  Non-urgent messages can be sent to your provider as well.   To learn more about what you can do with MyChart, go to [email or web address].    Your next appointment:   2 month(s)  The format for your next appointment:   In Person  Provider:   Minus Breeding, MD       Important Information About Sugar

## 2021-10-02 ENCOUNTER — Other Ambulatory Visit: Payer: Self-pay

## 2021-10-02 ENCOUNTER — Inpatient Hospital Stay: Payer: Medicare Other

## 2021-10-02 VITALS — BP 110/52 | HR 62 | Temp 96.4°F | Resp 18

## 2021-10-02 DIAGNOSIS — Z801 Family history of malignant neoplasm of trachea, bronchus and lung: Secondary | ICD-10-CM | POA: Diagnosis not present

## 2021-10-02 DIAGNOSIS — C3411 Malignant neoplasm of upper lobe, right bronchus or lung: Secondary | ICD-10-CM | POA: Diagnosis not present

## 2021-10-02 DIAGNOSIS — Z7952 Long term (current) use of systemic steroids: Secondary | ICD-10-CM | POA: Diagnosis not present

## 2021-10-02 DIAGNOSIS — I4891 Unspecified atrial fibrillation: Secondary | ICD-10-CM | POA: Diagnosis not present

## 2021-10-02 DIAGNOSIS — Z5111 Encounter for antineoplastic chemotherapy: Secondary | ICD-10-CM | POA: Diagnosis not present

## 2021-10-02 DIAGNOSIS — I714 Abdominal aortic aneurysm, without rupture, unspecified: Secondary | ICD-10-CM | POA: Diagnosis not present

## 2021-10-02 DIAGNOSIS — I2510 Atherosclerotic heart disease of native coronary artery without angina pectoris: Secondary | ICD-10-CM | POA: Diagnosis not present

## 2021-10-02 DIAGNOSIS — I252 Old myocardial infarction: Secondary | ICD-10-CM | POA: Diagnosis not present

## 2021-10-02 DIAGNOSIS — E785 Hyperlipidemia, unspecified: Secondary | ICD-10-CM | POA: Diagnosis not present

## 2021-10-02 DIAGNOSIS — F17210 Nicotine dependence, cigarettes, uncomplicated: Secondary | ICD-10-CM | POA: Diagnosis not present

## 2021-10-02 DIAGNOSIS — Z8673 Personal history of transient ischemic attack (TIA), and cerebral infarction without residual deficits: Secondary | ICD-10-CM | POA: Diagnosis not present

## 2021-10-02 DIAGNOSIS — I10 Essential (primary) hypertension: Secondary | ICD-10-CM | POA: Diagnosis not present

## 2021-10-02 DIAGNOSIS — Z79899 Other long term (current) drug therapy: Secondary | ICD-10-CM | POA: Diagnosis not present

## 2021-10-02 DIAGNOSIS — M7989 Other specified soft tissue disorders: Secondary | ICD-10-CM | POA: Diagnosis not present

## 2021-10-02 LAB — CBC WITH DIFFERENTIAL/PLATELET
Abs Immature Granulocytes: 0.15 10*3/uL — ABNORMAL HIGH (ref 0.00–0.07)
Basophils Absolute: 0.1 10*3/uL (ref 0.0–0.1)
Basophils Relative: 1 %
Eosinophils Absolute: 0.1 10*3/uL (ref 0.0–0.5)
Eosinophils Relative: 1 %
HCT: 30.3 % — ABNORMAL LOW (ref 39.0–52.0)
Hemoglobin: 10 g/dL — ABNORMAL LOW (ref 13.0–17.0)
Immature Granulocytes: 1 %
Lymphocytes Relative: 13 %
Lymphs Abs: 2.3 10*3/uL (ref 0.7–4.0)
MCH: 32.1 pg (ref 26.0–34.0)
MCHC: 33 g/dL (ref 30.0–36.0)
MCV: 97.1 fL (ref 80.0–100.0)
Monocytes Absolute: 1.1 10*3/uL — ABNORMAL HIGH (ref 0.1–1.0)
Monocytes Relative: 6 %
Neutro Abs: 13.6 10*3/uL — ABNORMAL HIGH (ref 1.7–7.7)
Neutrophils Relative %: 78 %
Platelets: 194 10*3/uL (ref 150–400)
RBC: 3.12 MIL/uL — ABNORMAL LOW (ref 4.22–5.81)
RDW: 20.2 % — ABNORMAL HIGH (ref 11.5–15.5)
WBC: 17.3 10*3/uL — ABNORMAL HIGH (ref 4.0–10.5)
nRBC: 0 % (ref 0.0–0.2)

## 2021-10-02 LAB — COMPREHENSIVE METABOLIC PANEL
ALT: 17 U/L (ref 0–44)
AST: 19 U/L (ref 15–41)
Albumin: 3.3 g/dL — ABNORMAL LOW (ref 3.5–5.0)
Alkaline Phosphatase: 59 U/L (ref 38–126)
Anion gap: 9 (ref 5–15)
BUN: 25 mg/dL — ABNORMAL HIGH (ref 8–23)
CO2: 23 mmol/L (ref 22–32)
Calcium: 8.7 mg/dL — ABNORMAL LOW (ref 8.9–10.3)
Chloride: 107 mmol/L (ref 98–111)
Creatinine, Ser: 1.09 mg/dL (ref 0.61–1.24)
GFR, Estimated: >60 mL/min (ref >60)
Glucose, Bld: 110 mg/dL — ABNORMAL HIGH (ref 70–99)
Potassium: 3.6 mmol/L (ref 3.5–5.1)
Sodium: 139 mmol/L (ref 135–145)
Total Bilirubin: 0.7 mg/dL (ref 0.3–1.2)
Total Protein: 6.7 g/dL (ref 6.5–8.1)

## 2021-10-02 LAB — MAGNESIUM: Magnesium: 1.8 mg/dL (ref 1.7–2.4)

## 2021-10-02 MED ORDER — SODIUM CHLORIDE 0.9% FLUSH
10.0000 mL | INTRAVENOUS | Status: DC | PRN
  Administered 2021-10-02: 10 mL

## 2021-10-02 MED ORDER — HEPARIN SOD (PORK) LOCK FLUSH 100 UNIT/ML IV SOLN
500.0000 [IU] | Freq: Once | INTRAVENOUS | Status: AC | PRN
  Administered 2021-10-02: 500 [IU]

## 2021-10-02 MED ORDER — SODIUM CHLORIDE 0.9 % IV SOLN
10.0000 mg | Freq: Once | INTRAVENOUS | Status: AC
  Administered 2021-10-02: 10 mg via INTRAVENOUS
  Filled 2021-10-02: qty 1

## 2021-10-02 MED ORDER — SODIUM CHLORIDE 0.9 % IV SOLN
424.5000 mg | Freq: Once | INTRAVENOUS | Status: AC
  Administered 2021-10-02: 420 mg via INTRAVENOUS
  Filled 2021-10-02: qty 42

## 2021-10-02 MED ORDER — SODIUM CHLORIDE 0.9 % IV SOLN
150.0000 mg | Freq: Once | INTRAVENOUS | Status: AC
  Administered 2021-10-02: 150 mg via INTRAVENOUS
  Filled 2021-10-02: qty 150

## 2021-10-02 MED ORDER — SODIUM CHLORIDE 0.9 % IV SOLN: Freq: Once | INTRAVENOUS | Status: AC

## 2021-10-02 MED ORDER — CYANOCOBALAMIN 1000 MCG/ML IJ SOLN
1000.0000 ug | Freq: Once | INTRAMUSCULAR | Status: AC
  Administered 2021-10-02: 1000 ug via INTRAMUSCULAR
  Filled 2021-10-02: qty 1

## 2021-10-02 MED ORDER — SODIUM CHLORIDE 0.9 % IV SOLN
510.0000 mg/m2 | Freq: Once | INTRAVENOUS | Status: AC
  Administered 2021-10-02: 1000 mg via INTRAVENOUS
  Filled 2021-10-02: qty 40

## 2021-10-02 MED ORDER — PALONOSETRON HCL INJECTION 0.25 MG/5ML
0.2500 mg | Freq: Once | INTRAVENOUS | Status: AC
  Administered 2021-10-02: 0.25 mg via INTRAVENOUS
  Filled 2021-10-02: qty 5

## 2021-10-02 NOTE — Progress Notes (Signed)
Patient presents today for cycle 3 of Carbo/Alimata. Per note Dr. Claudina Lick okay to proceed with treatment if cleared by cardiology, per Dr. Percival Spanish  cardiology patient okay to proceed with chemotherapy.  Patient tolerated injection with no complaints voiced. Site clean and dry with no bruising or swelling noted at site. See MAR for details. Band aid applied.  Patient stable during and after injection.  Patient tolerated chemotherapy with no complaints voiced. Side effects with management reviewed understanding verbalized. Port site clean and dry with no bruising or swelling noted at site. Good blood return noted before and after administration of chemotherapy. Band aid applied. Patient left in satisfactory condition with VSS and no s/s of distress noted.

## 2021-10-02 NOTE — Progress Notes (Signed)
Patients port flushed without difficulty.  Good blood return noted with no bruising or swelling noted at site.  stable during access and blood draw.  Patient to remain accessed for treatment.

## 2021-10-02 NOTE — Patient Instructions (Signed)
[facility]  Discharge Instructions: Thank you for choosing [facility] to provide your oncology and hematology care.  If you have a lab appointment with the [facility], please come in thru the Main Entrance and check in at the main information desk.  Wear comfortable clothing and clothing appropriate for easy access to any Portacath or PICC line.   We strive to give you quality time with your provider. You may need to reschedule your appointment if you arrive late (15 or more minutes).  Arriving late affects you and other patients whose appointments are after yours.  Also, if you miss three or more appointments without notifying the office, you may be dismissed from the clinic at the provider's discretion.      For prescription refill requests, have your pharmacy contact our office and allow 72 hours for refills to be completed.    Today you received the following chemotherapy and/or immunotherapy agents Almita, Carboplatin, and B12 injection, return as scheduled.   To help prevent nausea and vomiting after your treatment, we encourage you to take your nausea medication as directed.  BELOW ARE SYMPTOMS THAT SHOULD BE REPORTED IMMEDIATELY: *FEVER GREATER THAN 100.4 F (38 C) OR HIGHER *CHILLS OR SWEATING *NAUSEA AND VOMITING THAT IS NOT CONTROLLED WITH YOUR NAUSEA MEDICATION *UNUSUAL SHORTNESS OF BREATH *UNUSUAL BRUISING OR BLEEDING *URINARY PROBLEMS (pain or burning when urinating, or frequent urination) *BOWEL PROBLEMS (unusual diarrhea, constipation, pain near the anus) TENDERNESS IN MOUTH AND THROAT WITH OR WITHOUT PRESENCE OF ULCERS (sore throat, sores in mouth, or a toothache) UNUSUAL RASH, SWELLING OR PAIN  UNUSUAL VAGINAL DISCHARGE OR ITCHING   Items with * indicate a potential emergency and should be followed up as soon as possible or go to the Emergency Department if any problems should occur.  Please show the CHEMOTHERAPY ALERT CARD or  IMMUNOTHERAPY ALERT CARD at check-in to the Emergency Department and triage nurse.  Should you have questions after your visit or need to cancel or reschedule your appointment, please contact [facility] [phone]  and follow the prompts.  Office hours are 8:00 a.m. to 4:30 p.m. Monday - Friday. Please note that voicemails left after 4:00 p.m. may not be returned until the following business day.  We are closed weekends and major holidays. You have access to a nurse at all times for urgent questions. Please call the main number to the clinic [phone] and follow the prompts.  For any non-urgent questions, you may also contact your provider using MyChart. We now offer e-Visits for anyone 70 and older to request care online for non-urgent symptoms. For details visit [email or web address].   Also download the MyChart app! Go to the app store, search "MyChart", open the app, select [facility], and log in with your MyChart username and password.  Masks are optional in the cancer centers. If you would like for your care team to wear a mask while they are taking care of you, please let them know. You may have one support person who is at least 74 years old accompany you for your appointments.

## 2021-10-08 ENCOUNTER — Ambulatory Visit (HOSPITAL_COMMUNITY)
Admission: RE | Admit: 2021-10-08 | Discharge: 2021-10-08 | Disposition: A | Discharge status: Home / Self Care | Payer: Medicare Other | Source: Ambulatory Visit | Attending: Hematology | Admitting: Hematology

## 2021-10-08 DIAGNOSIS — C3411 Malignant neoplasm of upper lobe, right bronchus or lung: Secondary | ICD-10-CM | POA: Diagnosis not present

## 2021-10-08 DIAGNOSIS — J439 Emphysema, unspecified: Secondary | ICD-10-CM | POA: Diagnosis not present

## 2021-10-08 DIAGNOSIS — R918 Other nonspecific abnormal finding of lung field: Secondary | ICD-10-CM | POA: Diagnosis not present

## 2021-10-08 DIAGNOSIS — C3490 Malignant neoplasm of unspecified part of unspecified bronchus or lung: Secondary | ICD-10-CM | POA: Diagnosis not present

## 2021-10-08 MED ORDER — IOHEXOL 300 MG/ML  SOLN
100.0000 mL | Freq: Once | INTRAMUSCULAR | Status: AC | PRN
  Administered 2021-10-08: 75 mL via INTRAVENOUS

## 2021-10-16 ENCOUNTER — Other Ambulatory Visit (HOSPITAL_COMMUNITY): Payer: Self-pay | Admitting: Hematology

## 2021-10-17 ENCOUNTER — Encounter: Payer: Self-pay | Admitting: Hematology

## 2021-10-17 ENCOUNTER — Inpatient Hospital Stay: Payer: Medicare Other | Admitting: Hematology

## 2021-10-17 DIAGNOSIS — I739 Peripheral vascular disease, unspecified: Secondary | ICD-10-CM | POA: Diagnosis not present

## 2021-10-17 DIAGNOSIS — Z79899 Other long term (current) drug therapy: Secondary | ICD-10-CM | POA: Diagnosis not present

## 2021-10-17 DIAGNOSIS — C3411 Malignant neoplasm of upper lobe, right bronchus or lung: Secondary | ICD-10-CM

## 2021-10-17 DIAGNOSIS — I2510 Atherosclerotic heart disease of native coronary artery without angina pectoris: Secondary | ICD-10-CM | POA: Diagnosis not present

## 2021-10-17 DIAGNOSIS — M7989 Other specified soft tissue disorders: Secondary | ICD-10-CM | POA: Diagnosis not present

## 2021-10-17 DIAGNOSIS — Z8673 Personal history of transient ischemic attack (TIA), and cerebral infarction without residual deficits: Secondary | ICD-10-CM | POA: Diagnosis not present

## 2021-10-17 DIAGNOSIS — I4891 Unspecified atrial fibrillation: Secondary | ICD-10-CM | POA: Diagnosis not present

## 2021-10-17 DIAGNOSIS — I10 Essential (primary) hypertension: Secondary | ICD-10-CM | POA: Diagnosis not present

## 2021-10-17 DIAGNOSIS — Z5111 Encounter for antineoplastic chemotherapy: Secondary | ICD-10-CM | POA: Diagnosis not present

## 2021-10-17 DIAGNOSIS — E785 Hyperlipidemia, unspecified: Secondary | ICD-10-CM | POA: Diagnosis not present

## 2021-10-17 DIAGNOSIS — Z7952 Long term (current) use of systemic steroids: Secondary | ICD-10-CM | POA: Diagnosis not present

## 2021-10-17 DIAGNOSIS — Z801 Family history of malignant neoplasm of trachea, bronchus and lung: Secondary | ICD-10-CM | POA: Diagnosis not present

## 2021-10-17 DIAGNOSIS — I252 Old myocardial infarction: Secondary | ICD-10-CM | POA: Diagnosis not present

## 2021-10-17 DIAGNOSIS — I714 Abdominal aortic aneurysm, without rupture, unspecified: Secondary | ICD-10-CM | POA: Diagnosis not present

## 2021-10-17 DIAGNOSIS — F17210 Nicotine dependence, cigarettes, uncomplicated: Secondary | ICD-10-CM | POA: Diagnosis not present

## 2021-10-17 MED ORDER — METOPROLOL SUCCINATE ER 50 MG PO TB24: 50.0000 mg | ORAL_TABLET | Freq: Every day | ORAL | 1 refills | Status: DC

## 2021-10-17 MED ORDER — FUROSEMIDE 20 MG PO TABS: 20.0000 mg | ORAL_TABLET | Freq: Every day | ORAL | 2 refills | Status: DC | PRN

## 2021-10-17 MED ORDER — APIXABAN 5 MG PO TABS: 5.0000 mg | ORAL_TABLET | Freq: Two times a day (BID) | ORAL | 1 refills | Status: DC

## 2021-10-17 MED ORDER — FOLIC ACID 1 MG PO TABS: 1.0000 mg | ORAL_TABLET | Freq: Every day | ORAL | 0 refills | Status: DC

## 2021-10-17 NOTE — Progress Notes (Signed)
Darryl Ward, [place] [address]   CLINIC:  Medical Oncology/Hematology  PCP:  [name], NP [name] 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  PRIOR THERAPY: none  NGS Results: not done  CURRENT THERAPY: Neoadjuvant chemoimmunotherapy  BRIEF ONCOLOGIC HISTORY:  Oncology History  Primary adenocarcinoma of upper lobe of right lung ([place])  06/26/2021 Initial Diagnosis   Primary adenocarcinoma of upper lobe of right lung ([place])   08/06/2021 - 08/27/2021 Chemotherapy   Patient is on Treatment Plan : LUNG NSCLC Pemetrexed + Carboplatin q21d x 4 Cycles     08/06/2021 -  Chemotherapy   Patient is on Treatment Plan : LUNG NSCLC Pemetrexed + Carboplatin q21d x 4 Cycles       CANCER STAGING:  Cancer Staging  Primary adenocarcinoma of upper lobe of right lung ([place]) Staging form: Lung, AJCC 8th Edition - Clinical stage from 06/26/2021: cT1, cN1, cM0 - Unsigned   INTERVAL HISTORY:  [name], a 76 y.o. male, seen for follow-up of right lung cancer.  He has completed 3 cycles of neoadjuvant chemotherapy.  He is continuing to be on Eliquis and has easy bruising and occasional nosebleeds.  REVIEW OF SYSTEMS:  Review of Systems  Constitutional:  Positive for fatigue.  Respiratory:  Positive for cough and shortness of breath.   Neurological:  Positive for dizziness and numbness.  All other systems reviewed and are negative.   PAST MEDICAL/SURGICAL HISTORY:  Past Medical History:  Diagnosis Date   AAA (abdominal aortic aneurysm) (HCC)    Arthritis    Carotid artery disease ([place])    Nonobstructive   Cataract    Coronary atherosclerosis of native coronary artery    PTCA small diagonal 2007 otherwise nonobstructive CAD, EF 50-55%   Essential hypertension, benign    Hyperlipidemia    NSTEMI (non-ST elevated myocardial infarction) ([place]) 2007   Stroke [facility])  2004   TIA (transient ischemic attack) 2006   Past Surgical History:  Procedure Laterality Date   AORTA - BILATERAL FEMORAL ARTERY BYPASS GRAFT  01/08/2012   Procedure: AORTA BIFEMORAL BYPASS GRAFT;  Surgeon: Elam Dutch, MD;  Location: [facility] OR;  Service: Vascular;  Laterality: Bilateral;  using 18x60m x 40cm Hemashield Gold Vascular Graft with Endarterectomy, Thombectomy and  Reimplantation of Inferior Mesenteric Artery   BRONCHIAL BIOPSY  06/11/2021   Procedure: BRONCHIAL BIOPSIES;  Surgeon: BCollene Gobble MD;  [facility]: [facility] ENDOSCOPY;  Service: Pulmonary;;   BRONCHIAL BRUSHINGS  06/11/2021   Procedure: BRONCHIAL BRUSHINGS;  Surgeon: BCollene Gobble MD;  [facility]  Service: Pulmonary;;   BRONCHIAL NEEDLE ASPIRATION BIOPSY  06/11/2021   Procedure: BRONCHIAL NEEDLE ASPIRATION BIOPSIES;  Surgeon: BCollene Gobble MD;  [facility];  Service: Pulmonary;;   CARDIOVERSION N/A 09/20/2021   Procedure: CARDIOVERSION;  Surgeon: MSatira Sark MD;  Location: [facility] ORS;  Service: Cardiovascular;  Laterality: N/A;   COLONOSCOPY N/A 04/14/2019   Procedure: COLONOSCOPY;  Surgeon: RDaneil Dolin MD;  Location: [facility] ENDO SUITE;  Service: Endoscopy;  Laterality: N/A;  9:30   COLONOSCOPY WITH PROPOFOL N/A 07/25/2021   Procedure: COLONOSCOPY WITH PROPOFOL;  Surgeon: CEloise Harman DO;  Location: [facility] END[facility] SUITE;  Service: Endoscopy;  Laterality: N/A;  1:00pm   FIDUCIAL MARKER PLACEMENT  06/11/2021   Procedure: FIDUCIAL MARKER PLACEMENT;  Surgeon: BCollene Gobble MD;  [facility];  Service: Pulmonary;;   IR IMAGING GUIDED PORT INSERTION  07/31/2021   Left cataract surgery     POLYPECTOMY  04/14/2019   Procedure: POLYPECTOMY;  Surgeon: Daneil Dolin, MD;  [facility];  Service: Endoscopy;;   POLYPECTOMY  07/25/2021   Procedure: POLYPECTOMY;  Surgeon: Eloise Harman, DO;  [facility];  Service: Endoscopy;;   TEE WITHOUT CARDIOVERSION N/A 09/20/2021    Procedure: TRANSESOPHAGEAL ECHOCARDIOGRAM (TEE);  Surgeon: Satira Sark, MD;  Location: [facility] ORS;  Service: Cardiovascular;  Laterality: N/A;   TRANSFORAMINAL LUMBAR INTERBODY FUSION (TLIF) WITH PEDICLE SCREW FIXATION 1 LEVEL N/A 04/27/2020   Procedure: Transforaminal Lumbar Interbody Fusion Lumbar Five-Sacral One;  Surgeon: Vallarie Mare, MD;  Location: [facility];  Service: Neurosurgery;  Laterality: N/A;   VIDEO BRONCHOSCOPY WITH RADIAL ENDOBRONCHIAL ULTRASOUND  06/11/2021   Procedure: VIDEO BRONCHOSCOPY WITH RADIAL ENDOBRONCHIAL ULTRASOUND;  Surgeon: [name], MD;  [facility] END[facility];  Service: Pulmonary;;    SOCIAL HISTORY:  Social History   Socioeconomic History   Marital status: Divorced    Spouse name: Not on file   Number of children: 1   Years of education: 11   Highest education level: 11th grade  Occupational History    Employer: Probation officer  Tobacco Use   Smoking status: Every Day    Packs/day: 1.00    Years: 40.00    Total pack years: 40.00    Types: Cigarettes   Smokeless tobacco: Never   Tobacco comments:    1 pack of cigarettes smoked daily. 07/17/21 ARJ, RN   Vaping Use   Vaping Use: Never used  Substance and Sexual Activity   Alcohol use: No    Comment: Prior history of regular alcohol use   Drug use: No   Sexual activity: Not Currently  Other Topics Concern   Not on file  Social History Narrative   Not on file   Social Determinants of Health   Financial Resource Strain: Low Risk  (11/29/2019)   Overall Financial Resource Strain (CARDIA)    Difficulty of Paying Living Expenses: Not hard at all  Food Insecurity: No Food Insecurity (11/29/2019)   Hunger Vital Sign    Worried About Running Out of Food in the Last Year: Never true    Ran Out of Food in the Last Year: Never true  Transportation Needs: No Transportation Needs (11/29/2019)   PRAPARE - Hydrologist (Medical): No    Lack of Transportation  (Non-Medical): No  Physical Activity: Inactive (11/29/2019)   Exercise Vital Sign    Days of Exercise per Week: 0 days    Minutes of Exercise per Session: 0 min  Stress: No Stress Concern Present (11/29/2019)   [place]    Feeling of Stress : Only a little  Social Connections: Moderately Isolated (11/29/2019)   Social Connection and Isolation Panel [NHANES]    Frequency of Communication with Friends and Family: More than three times a week    Frequency of Social Gatherings with Friends and Family: More than three times a week    Attends Religious Services: 1 to 4 times per year    Active Member of [employer] or Organizations: No    Attends Archivist Meetings: Never    Marital Status: Divorced  Human resources officer Violence: Not At Risk (11/20/2018)   Humiliation, Afraid, Rape, and Kick questionnaire    Fear of Current  or Ex-Partner: No    Emotionally Abused: No    Physically Abused: No    Sexually Abused: No    FAMILY HISTORY:  Family History  Problem Relation Age of Onset   Hyperlipidemia Sister    Heart attack Brother 26   Cancer - Colon Neg Hx     CURRENT MEDICATIONS:  Current Outpatient Medications  Medication Sig Dispense Refill   atorvastatin (LIPITOR) 80 MG tablet Take 80 mg by mouth daily.     cyclobenzaprine (FLEXERIL) 10 MG tablet Take 10 mg by mouth 2 (two) times daily as needed for muscle spasms.     apixaban (ELIQUIS) 5 MG TABS tablet Take 1 tablet (5 mg total) by mouth 2 (two) times daily. 094 tablet 1   folic acid (FOLVITE) 1 MG tablet Take 1 tablet (1 mg total) by mouth daily. 15 tablet 0   furosemide (LASIX) 20 MG tablet Take 1 tablet (20 mg total) by mouth daily as needed. 90 tablet 2   metoprolol succinate (TOPROL-XL) 50 MG 24 hr tablet Take 1 tablet (50 mg total) by mouth daily. 90 tablet 1   No current facility-administered medications for this visit.    ALLERGIES:  No Known  Allergies  PHYSICAL EXAM:  Performance status (ECOG): 1 - Symptomatic but completely ambulatory  Vitals:   10/17/21 1351  BP: (!) 106/57  Pulse: 60  Temp: 98 F (36.7 C)  SpO2: 100%    Wt Readings from Last 3 Encounters:  10/17/21 161 lb 13.1 oz (73.4 kg)  10/02/21 167 lb 6.4 oz (75.9 kg)  10/01/21 165 lb 6.4 oz (75 kg)   Physical Exam Vitals reviewed.  Constitutional:      Appearance: Normal appearance.  Cardiovascular:     Rate and Rhythm: Normal rate and regular rhythm.     Pulses: Normal pulses.     Heart sounds: Normal heart sounds.  Pulmonary:     Effort: Pulmonary effort is normal.     Breath sounds: Normal breath sounds.  Neurological:     General: No focal deficit present.     Mental Status: He is alert and oriented to person, place, and time.  Psychiatric:        Mood and Affect: Mood normal.        Behavior: Behavior normal.      LABORATORY DATA:  I have reviewed the labs as listed.     Latest Ref Rng & Units 10/02/2021    8:08 AM 09/25/2021    8:13 AM 09/20/2021    5:07 AM  CBC  WBC 4.0 - 10.5 K/uL 17.3  9.8  7.6   Hemoglobin 13.0 - 17.0 g/dL 10.0  9.0  8.9   Hematocrit 39.0 - 52.0 % 30.3  27.4  27.1   Platelets 150 - 400 K/uL 194  178  160       Latest Ref Rng & Units 10/02/2021    8:08 AM 09/25/2021    8:13 AM 09/19/2021    4:10 AM  CMP  Glucose 70 - 99 mg/dL 110  120  95   BUN 8 - 23 mg/dL 25  14  15    Creatinine 0.61 - 1.24 mg/dL 1.09  1.04  1.11   Sodium 135 - 145 mmol/L 139  138  139   Potassium 3.5 - 5.1 mmol/L 3.6  4.5  4.0   Chloride 98 - 111 mmol/L 107  110  110   CO2 22 - 32 mmol/L 23  23  24  Calcium 8.9 - 10.3 mg/dL 8.7  8.7  8.5   Total Protein 6.5 - 8.1 g/dL 6.7  6.2    Total Bilirubin 0.3 - 1.2 mg/dL 0.7  0.6    Alkaline Phos 38 - 126 U/L 59  55    AST 15 - 41 U/L 19  17    ALT 0 - 44 U/L 17  16      DIAGNOSTIC IMAGING:  I have independently reviewed the scans and discussed with the patient. CT Chest W Contrast  Result  Date: 10/09/2021 CLINICAL DATA:  Non-small-cell lung cancer. Restaging. * Tracking Code: BO * EXAM: CT CHEST WITH CONTRAST TECHNIQUE: Multidetector CT imaging of the chest was performed during intravenous contrast administration. RADIATION DOSE REDUCTION: This exam was performed according to the departmental dose-optimization program which includes automated exposure control, adjustment of the mA and/or kV according to patient size and/or use of iterative reconstruction technique. CONTRAST:  82m OMNIPAQUE IOHEXOL 300 MG/ML  SOLN COMPARISON:  PET-CT 06/22/2021.  Chest CT 06/08/2021. FINDINGS: Cardiovascular: The heart size is normal. No substantial pericardial effusion. Coronary artery calcification is evident. Moderate atherosclerotic calcification is noted in the wall of the thoracic aorta. Left-sided Port-A-Cath tip is positioned at the SVC/RA junction. Mediastinum/Nodes: No mediastinal lymphadenopathy. 12 mm short axis right hilar lymph node is stable in the interval, likely reactive. No left hilar lymphadenopathy. The esophagus has normal imaging features. There is no axillary lymphadenopathy. Lungs/Pleura: Subpleural reticulation in the upper lungs bilaterally is similar to prior. Centrilobular and paraseptal emphysema evident. Biapical pleuroparenchymal scarring again noted. Ground-glass lesion seen posterior right upper lobe has decreased in size since prior studies measuring 9 x 5 mm today on image 62/4 compared to 12 x 11 mm on 06/08/2021. Adjacent fiducial marker again noted. 4 mm subpleural nodule left lower lobe on 96/4 is unchanged. No new suspicious pulmonary nodule or mass although a tiny 2 mm posterior left upper lobe nodule on 79/4 is new since prior. No focal airspace consolidation. There is no evidence of pleural effusion. Upper Abdomen: Tiny hypoattenuating lesions in the visualized upper kidneys are compatible with cyst. 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 Patient's known abdominal aortic aneurysm has been incompletely visualized on today's study. Musculoskeletal: No worrisome lytic or sclerotic osseous abnormality. IMPRESSION: 1. Interval decrease in size of the ground-glass lesion in the posterior right upper lobe with adjacent fiducial marker. No suspicious new or progressive findings. 2. Tiny 2 mm posterior left upper lobe pulmonary nodule, new since prior. Attention on follow-up recommended. 3. Aortic Atherosclerosis (ICD10-I70.0) and Emphysema (ICD10-J43.9). Electronically Signed   By: EMisty StanleyM.D.   On: 10/09/2021 12:16   ECHO TEE  Result Date: 09/20/2021    TRANSESOPHOGEAL ECHO REPORT   Patient Name:   Darryl BOQUETDate of Exam: 09/20/2021 Medical Rec #:  [id number]       Height:       70.0 in Accession #:    [id number]      Weight:       156.9 lb Date of Birth:  1June 07, 1947        BSA:          1.883 m Patient Age:    89years         BP:           89/58 mmHg Patient Gender: M  HR:           108 bpm. Exam Location:  [facility] Procedure: Transesophageal Echo Indications:    Atrial Fibrillation  History:        Patient has prior history of Echocardiogram examinations, most                 recent 09/17/2021. CHF, CAD and Previous Myocardial Infarction,                 COPD, Arrythmias:Atrial Fibrillation; Risk Factors:Dyslipidemia                 and Current Smoker.  Sonographer:    Wenda Low Referring Phys: [id number] [place]: TEE procedure time was 9 minutes. The transesophogeal probe was passed without difficulty through the esophogus of the patient. Imaged were obtained with the patient in a left lateral decubitus position. Sedation performed by different physician. The patient was monitored while under deep sedation. Image quality was good. The patient developed no complications during the procedure. IMPRESSIONS  1. Limited study in terms of color Doppler and zooming capabilies  of echocardiographic work station.  2. Left ventricular ejection fraction, by estimation, is 30 to 35%. The left ventricle has moderately decreased function. The left ventricle demonstrates global hypokinesis.  3. Right ventricular systolic function is mildly reduced. The right ventricular size is normal.  4. Left atrial size was moderately dilated. No left atrial/left atrial appendage thrombus was detected.  5. Right atrial size was moderately dilated.  6. The mitral valve is grossly normal. Mild mitral valve regurgitation.  7. Tricuspid valve regurgitation is mild to moderate.  8. The aortic valve is tricuspid. Aortic valve regurgitation is not visualized.  9. There is Moderate (Grade III) atheroma plaque involving the descending aorta. FINDINGS  Left Ventricle: Left ventricular ejection fraction, by estimation, is 30 to 35%. The left ventricle has moderately decreased function. The left ventricle demonstrates global hypokinesis. The left ventricular internal cavity size was normal in size. Right Ventricle: The right ventricular size is normal. No increase in right ventricular wall thickness. Right ventricular systolic function is mildly reduced. Left Atrium: Left atrial size was moderately dilated. No left atrial/left atrial appendage thrombus was detected. Right Atrium: Right atrial size was moderately dilated. Pericardium: There is no evidence of pericardial effusion. Mitral Valve: The mitral valve is grossly normal. Mild mitral valve regurgitation. Tricuspid Valve: The tricuspid valve is grossly normal. Tricuspid valve regurgitation is mild to moderate. Aortic Valve: The aortic valve is tricuspid. Aortic valve regurgitation is not visualized. Pulmonic Valve: The pulmonic valve was grossly normal. Pulmonic valve regurgitation is mild. Aorta: The aortic root is normal in size and structure. There is moderate (Grade III) atheroma plaque involving the descending aorta. IAS/Shunts: The interatrial septum was not  assessed. 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  Occasional additional small pulmonary nodules measuring 0.4 cm and smaller. - Bronchoscopy (06/11/2021): RUL nodule brushing and FNA: Malignant cells with features of adenocarcinoma. - PET scan (06/22/2021): Subsolid nodular lesion in the right upper lobe, hypermetabolic SUV 15.  Small right hilar and infrahilar lymph nodes with SUV 4.03 concerning for metastatic adenopathy.  Small hypermetabolic left parotid gland lesion, consistent with previous history of Wharton's  tumor.  Hypermetabolic focus in the transverse colon SUV 8.69. - MRI of the brain (07/11/2021): No evidence of metastatic disease.  Right sphenoid wing meningioma stable since 2022. - He was evaluated by Dr. Roxan Hockey and was recommended to undergo neoadjuvant chemoimmunotherapy followed by surgical resection. - 3 cycles of carboplatin, pemetrexed from 08/06/2021 through 10/02/2021 - NGS testing not performed due to insufficient sample. - Guardant[employer]: Negative for EGFR and ALK.  MSI high not detected. - Opdivo was given with only cycle 2, discontinued for cycle 3 due to his cardiac condition.    Social/family history: - Lives at home with his wife.  Uses cane occasionally after he had stroke in January 2022.  He has retired after working in [employer].  He is current active smoker, 1 pack/day for 60 years.  No exposure to chemicals. - Brother died of metastatic cancer.  Maternal uncle had lung cancer.   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 - CT chest with contrast (10/08/2021): Decrease in size of the groundglass lesion in the posterior right  upper lobe.  Tiny 2 mm posterior left lobe lung nodule is new. - He has a follow-up with Dr. Roxan Hockey tomorrow. - I will see him back in 4 weeks after surgery.  I do not believe to opdivo has anything to do with his decrease in ejection fraction.  There was no clear sign of myocarditis.  Hence he will be recommended adjuvant immunotherapy for 1 year.  2.  Abdominal uptake on PET scan: - Colonoscopy on 04/14/2019 with multiple adenomatous polyps removed. - Uptake area on the PET scan appeared to be in the transverse colon.  Referral was made to GI service.  3.  Lower extremity swelling: - Continue Lasix as needed.  4.  New onset atrial fibrillation: - Hospitalized from 09/17/2021 through 09/21/2021 with A-fib and RVR.  He underwent cardioversion.  TEE was negative for atrial thrombus.  LVEF was reduced to 35 to 40%, thought to be secondary to atrial fibrillation. - He is continuing Eliquis at this time.  Continue follow-up with cardiology.   Orders placed this encounter:  No orders of the defined types were placed in this encounter.     Derek Jack, MD [place] [phone]

## 2021-10-17 NOTE — Patient Instructions (Signed)
[facility]  Discharge Instructions  You were seen and examined today by Dr. Delton Coombes.  Proceed with your appointment with Dr. Roxan Hockey.  Your CT scan looks improved - this is great news!  Following surgery, there is a role for immunotherapy following surgery. Dr. Delton Coombes will discuss this with you further after surgery.  Follow-up as scheduled.  Thank you for choosing [facility] to provide your oncology and hematology care.   To afford each patient quality time with our provider, please arrive at least 15 minutes before your scheduled appointment time. You may need to reschedule your appointment if you arrive late (10 or more minutes). Arriving late affects you and other patients whose appointments are after yours.  Also, if you miss three or more appointments without notifying the office, you may be dismissed from the clinic at the provider's discretion.    Again, thank you for choosing [facility]  Our hope is that these requests will decrease the amount of time that you wait before being seen by our physicians.   If you have a lab appointment with the [facility] please come in thru the Main Entrance and check in at the main information desk.           _____________________________________________________________  Should you have questions after your visit to [facility], please contact our office at [phone] and follow the prompts.  Our office hours are 8:00 a.m. to 4:30 p.m. Monday - Thursday and 8:00 a.m. to 2:30 p.m. Friday.  Please note that voicemails left after 4:00 p.m. may not be returned until the following business day.  We are closed weekends and all major holidays.  You do have access to a nurse 24-7, just call the main number to the clinic [phone] and do not press any options, hold on the line and a nurse will answer the phone.    For prescription refill requests, have  your pharmacy contact our office and allow 72 hours.    Masks are optional in the cancer centers. If you would like for your care team to wear a mask while they are taking care of you, please let them know. You may have one support person who is at least 74 years old accompany you for your appointments.

## 2021-10-18 ENCOUNTER — Other Ambulatory Visit: Payer: Self-pay | Admitting: *Deleted

## 2021-10-18 ENCOUNTER — Other Ambulatory Visit: Payer: Self-pay

## 2021-10-18 ENCOUNTER — Encounter: Payer: Self-pay | Admitting: *Deleted

## 2021-10-18 ENCOUNTER — Ambulatory Visit: Payer: Medicare Other | Admitting: Thoracic Surgery (Cardiothoracic Vascular Surgery)

## 2021-10-18 VITALS — BP 98/62 | HR 80 | Resp 20 | Ht 70.0 in | Wt 160.0 lb

## 2021-10-18 DIAGNOSIS — C3411 Malignant neoplasm of upper lobe, right bronchus or lung: Secondary | ICD-10-CM

## 2021-10-18 NOTE — Progress Notes (Signed)
[address]       [place] [address]             [phone]     HPI: Mr. 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  He had a 60-pack-year history of smoking prior to quitting 2 months ago when he was diagnosed with lung cancer.  He was found to have a right upper lobe lung nodule on a low-dose CT for lung cancer screening.  Dr. Lamonte Sakai did a navigational bronchoscopy which showed adenocarcinoma.  PET/CT showed right hilar adenopathy.  There was no evidence of mediastinal adenopathy or distant metastases.  There was a question of a transverse colon lesion.  He had colonoscopy and that issue was resolved.  He was treated with neoadjuvant chemoimmunotherapy.  He received 3 cycles of carboplatin and pemetrexed.  He also received Opdivo with the third cycle.  After that he went into atrial fibrillation and was hospitalized.  He was cardioverted and discharged on Eliquis.  He tells the first 2 cycles of chemotherapy well.  The third cycle with the Opdivo and the atrial fibrillation resulted in him feeling very poorly.  He does feel better that he is converted back to sinus rhythm.  He is not having any chest pain, pressure, or tightness.  He does get short of breath with walking rapidly but not at a regular pace.  He has lost about 10 pounds since I saw him in June.  Past Medical History:  Diagnosis Date   AAA (abdominal aortic aneurysm) (HCC)    Arthritis    Carotid artery disease (HCC)    Nonobstructive   Cataract    Coronary atherosclerosis of native coronary artery    PTCA small diagonal 2007 otherwise nonobstructive CAD, EF 50-55%    Essential hypertension, benign    Hyperlipidemia    NSTEMI (non-ST elevated myocardial infarction) ([place]) 2007   Stroke [facility]) 2004   TIA (transient ischemic attack) 2006   Past Surgical History:  Procedure Laterality Date   AORTA - BILATERAL FEMORAL ARTERY BYPASS GRAFT  01/08/2012   Procedure: AORTA BIFEMORAL BYPASS GRAFT;  Surgeon: Elam Dutch, MD;  [facility];  Service: Vascular;  Laterality: Bilateral;  using 18x42mm x 40cm Hemashield Gold Vascular Graft with Endarterectomy, Thombectomy and  Reimplantation of Inferior Mesenteric Artery   BRONCHIAL BIOPSY  06/11/2021   Procedure: BRONCHIAL BIOPSIES;  Surgeon: [name], MD;  [facility]OS[facility];  Service: Pulmonary;;   BRONCHIAL BRUSHINGS  06/11/2021   Procedure: BRONCHIAL BRUSHINGS;  Surgeon: [name], MD;  Location: [place];  Service: Pulmonary;;   BRONCHIAL NEEDLE ASPIRATION BIOPSY  06/11/2021   Procedure: BRONCHIAL NEEDLE ASPIRATION BIOPSIES;  Surgeon: [name], MD;  Location: [facility] ENDOSCOPY;  Service: Pulmonary;;   CARDIOVERSION N/A 09/20/2021   Procedure: CARDIOVERSION;  Surgeon: Satira Sark, MD;  [facility]: [facility];  Service: Cardiovascular;  Laterality: N/A;   COLONOSCOPY N/A 04/14/2019   Procedure: COLONOSCOPY;  Surgeon: Daneil Dolin, MD;  [facility];  Service: Endoscopy;  Laterality: N/A;  9:30   COLONOSCOPY WITH PROPOFOL N/A 07/25/2021   Procedure: COLONOSCOPY WITH PROPOFOL;  Surgeon: Eloise Harman, DO;  [facility];  Service: Endoscopy;  Laterality: N/A;  1:00pm   FIDUCIAL MARKER PLACEMENT  06/11/2021   Procedure: FIDUCIAL MARKER PLACEMENT;  Surgeon: [name], MD;  Location[facility] ENDOSCOPY;  Service: Pulmonary;;   IR IMAGING GUIDED PORT INSERTION  07/31/2021   Left cataract surgery     POLYPECTOMY  04/14/2019   Procedure: POLYPECTOMY;  Surgeon: Daneil Dolin, MD;  [facility];  Service: Endoscopy;;   POLYPECTOMY  07/25/2021   Procedure:  POLYPECTOMY;  Surgeon: Eloise Harman, DO;  [facility];  Service: Endoscopy;;   TEE WITHOUT CARDIOVERSION N/A 09/20/2021   Procedure: TRANSESOPHAGEAL ECHOCARDIOGRAM (TEE);  Surgeon: Satira Sark, MD;  [facility];  Service: Cardiovascular;  Laterality: N/A;   TRANSFORAMINAL LUMBAR INTERBODY FUSION (TLIF) WITH PEDICLE SCREW FIXATION 1 LEVEL N/A 04/27/2020   Procedure: Transforaminal Lumbar Interbody Fusion Lumbar Five-Sacral One;  Surgeon: Vallarie Mare, MD;  Location: [place];  Service: Neurosurgery;  Laterality: N/A;   VIDEO BRONCHOSCOPY WITH RADIAL ENDOBRONCHIAL ULTRASOUND  06/11/2021   Procedure: VIDEO BRONCHOSCOPY WITH RADIAL ENDOBRONCHIAL ULTRASOUND;  Surgeon: [name], MD;  [facility];  Service: Pulmonary;;    Current Outpatient Medications  Medication Sig Dispense Refill   apixaban (ELIQUIS) 5 MG TABS tablet Take 1 tablet (5 mg total) by mouth 2 (two) times daily. 180 tablet 1   atorvastatin (LIPITOR) 80 MG tablet Take 80 mg by mouth daily.     cyclobenzaprine (FLEXERIL) 10 MG tablet Take 10 mg by mouth 2 (two) times daily as needed for muscle spasms.     folic acid (FOLVITE) 1 MG tablet Take 1 tablet (1 mg total) by mouth daily. 15 tablet 0   furosemide (LASIX) 20 MG tablet Take 1 tablet (20 mg total) by mouth daily as needed. 90 tablet 2   metoprolol succinate (TOPROL-XL) 50 MG 24 hr tablet Take 1 tablet (50 mg total) by mouth daily. 90 tablet 1   No current facility-administered medications for this visit.    Physical Exam BP 98/62   Pulse 80   Resp 20   Ht 5\' 10"  (1.778 m)   Wt 160 lb (72.6 kg)   SpO2 97% Comment: RA  BMI 22.14 kg/m  76 year old man in no acute distress Alert and oriented x3 with no focal deficits Very hard of hearing No carotid bruits.  No cervical or supraclavicular adenopathy Lungs clear bilaterally Cardiac slightly irregular, no murmur No peripheral edema  Diagnostic Tests: CT CHEST WITH CONTRAST    TECHNIQUE: Multidetector CT imaging of the chest was performed during intravenous contrast administration.   RADIATION DOSE REDUCTION: This exam was performed according to the departmental dose-optimization program which includes automated exposure control, adjustment of the mA and/or kV according to patient size and/or use of iterative reconstruction technique.   CONTRAST:  77mL OMNIPAQUE IOHEXOL 300 MG/ML  SOLN   COMPARISON:  PET-CT 06/22/2021.  Chest CT 06/08/2021.   FINDINGS: Cardiovascular: The heart size is normal. No substantial pericardial effusion. Coronary artery calcification is evident. Moderate atherosclerotic calcification is noted in the wall of the thoracic aorta. Left-sided Port-A-Cath tip is positioned at the SVC/RA junction.   Mediastinum/Nodes: No mediastinal lymphadenopathy. 12 mm short axis right hilar lymph node is stable in the interval, likely reactive. No left hilar lymphadenopathy. The esophagus has normal imaging features. There is no axillary lymphadenopathy.   Lungs/Pleura: Subpleural reticulation in the upper lungs bilaterally is similar to prior. Centrilobular and paraseptal emphysema evident. Biapical  pleuroparenchymal scarring again noted. Ground-glass lesion seen posterior right upper lobe has decreased in size since prior studies measuring 9 x 5 mm today on image 62/4 compared to 12 x 11 mm on 06/08/2021. Adjacent fiducial marker again noted. 4 mm subpleural nodule left lower lobe on 96/4 is unchanged. No new suspicious pulmonary nodule or mass although a tiny 2 mm posterior left upper lobe nodule on 79/4 is new since prior. No focal airspace consolidation. There is no evidence of pleural effusion.   Upper Abdomen: Tiny hypoattenuating lesions in the visualized upper kidneys are compatible with cyst. 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 Patient's known abdominal aortic  aneurysm has been incompletely visualized on today's study.   Musculoskeletal: No worrisome lytic or sclerotic osseous abnormality.   IMPRESSION: 1. Interval decrease in size of the ground-glass lesion in the posterior right upper lobe with adjacent fiducial marker. No suspicious new or progressive findings. 2. Tiny 2 mm posterior left upper lobe pulmonary nodule, new since prior. Attention on follow-up recommended. 3. Aortic Atherosclerosis (ICD10-I70.0) and Emphysema (ICD10-J43.9).     Electronically Signed   By: Misty Stanley M.D.   On: 10/09/2021 12:16 I personally reviewed the CT images.  There is been a decrease in the size of the right upper lobe nodule.  Hard to tell with a contrast scan versus a previous noncontrast scan but no dramatic change in size of right hilar adenopathy.  No evidence of mediastinal adenopathy or distant metastases.  2 mm left upper lobe nodule that is new.  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  Stage IIb adenocarcinoma right upper lobe-status post neoadjuvant chemoimmunotherapy.  Good response from the primary nodule, minimal response of right hilar adenopathy.  I do think he is potentially candidate for surgical resection.  He understands that there could be findings that would preclude surgical resection.  He is not a candidate for pneumonectomy due to his impaired diffusion capacity.  He should be able to tolerate a lobectomy.  Tobacco abuse-60 pack years.  Quit 2 months ago and diagnosed.  Atrial fibrillation-new onset about a month ago in the setting of chemoimmunotherapy.  He is on  Eliquis.  Heart rate is normal but rhythm slightly irregular today.  Followed by Dr. Percival Spanish.  Will need to hold Eliquis prior to surgery.  He is aware that surgery could trigger a recurrence of the atrial fibrillation.  Ejection fraction 30%-by echo in August.  Does have a history of CAD.  We will check with Dr. Percival Spanish to see if any additional cardiac testing is needed prior to surgery.  I discussed the proposed surgical procedure with Mr. Warriner.  The plan would be to do a robotic assisted right upper lobectomy.  He is aware of the general nature of the procedure including the need for general anesthesia, the incisions to be used, the use of the surgical robot, the use of a drainage tube postoperatively, the expected hospital stay, and the overall recovery.  I informed him of the indications, risks, benefits, and alternatives.  He understands the risks include, but not limited to death, MI, DVT, PE, bleeding, possible need for transfusion,  infection, cardiac arrhythmias, prolonged air leaks, as well as possibility of other unforeseeable complications.  Plan: We will tentatively plan for robotic right upper lobectomy on November 12, 2021. We will need to hold Eliquis 48 hours prior to surgery We will need to check with Dr. Percival Spanish to see if any additional cardiac testing is needed before surgery.  [name], MD [facility] [phone]

## 2021-10-18 NOTE — H&P (View-Only) (Signed)
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  He had a 60-pack-year history of smoking prior to quitting 2 months ago when he was diagnosed with lung cancer.  He was found to have a right upper lobe lung nodule on a low-dose CT for lung cancer screening.  Dr. Lamonte Sakai did a navigational bronchoscopy which showed adenocarcinoma.  PET/CT showed right hilar adenopathy.  There was no evidence of mediastinal adenopathy or distant metastases.  There was a question of a transverse colon lesion.  He had colonoscopy and that issue was resolved.  He was treated with neoadjuvant chemoimmunotherapy.  He received 3 cycles of carboplatin and pemetrexed.  He also received Opdivo with the third cycle.  After that he went into atrial fibrillation and was hospitalized.  He was cardioverted and discharged on Eliquis.  He tells the first 2 cycles of chemotherapy well.  The third cycle with the Opdivo and the atrial fibrillation resulted in him feeling very poorly.  He does feel better that he is converted back to sinus rhythm.  He is not having any chest pain, pressure, or tightness.  He does get short of breath with walking rapidly but not at a regular pace.  He has lost about 10 pounds since I saw him in June.  Past Medical History:  Diagnosis Date   AAA (abdominal aortic aneurysm) (HCC)    Arthritis    Carotid artery disease (HCC)    Nonobstructive   Cataract    Coronary atherosclerosis of native coronary artery    PTCA small diagonal 2007 otherwise nonobstructive CAD, EF 50-55%    Essential hypertension, benign    Hyperlipidemia    NSTEMI (non-ST elevated myocardial infarction) ([place]) 2007   Stroke [facility]) 2004   TIA (transient ischemic attack) 2006   Past Surgical History:  Procedure Laterality Date   AORTA - BILATERAL FEMORAL ARTERY BYPASS GRAFT  01/08/2012   Procedure: AORTA BIFEMORAL BYPASS GRAFT;  Surgeon: Elam Dutch, MD;  [facility];  Service: Vascular;  Laterality: Bilateral;  using 18x16mm x 40cm Hemashield Gold Vascular Graft with Endarterectomy, Thombectomy and  Reimplantation of Inferior Mesenteric Artery   BRONCHIAL BIOPSY  06/11/2021   Procedure: BRONCHIAL BIOPSIES;  Surgeon: [name], MD;  [facility] ENDOS[facility];  Service: Pulmonary;;   BRONCHIAL BRUSHINGS  06/11/2021   Procedure: BRONCHIAL BRUSHINGS;  Surgeon: [name], MD;  Location: [place];  Service: Pulmonary;;   BRONCHIAL NEEDLE ASPIRATION BIOPSY  06/11/2021   Procedure: BRONCHIAL NEEDLE ASPIRATION BIOPSIES;  Surgeon: [name], MD;  Location: [facility] ENDOSCOPY;  Service: Pulmonary;;   CARDIOVERSION N/A 09/20/2021   Procedure: CARDIOVERSION;  Surgeon: Satira Sark, MD;  [facility];  Service: Cardiovascular;  Laterality: N/A;   COLONOSCOPY N/A 04/14/2019   Procedure: COLONOSCOPY;  Surgeon: Daneil Dolin, MD;  [facility];  Service: Endoscopy;  Laterality: N/A;  9:30   COLONOSCOPY WITH PROPOFOL N/A 07/25/2021   Procedure: COLONOSCOPY WITH PROPOFOL;  Surgeon: Eloise Harman, DO;  [facility];  Service: Endoscopy;  Laterality: N/A;  1:00pm   FIDUCIAL MARKER PLACEMENT  06/11/2021   Procedure: FIDUCIAL MARKER PLACEMENT;  Surgeon: [name], MD;  Location: [facility] ENDOSCOPY;  Service: Pulmonary;;   IR IMAGING GUIDED PORT INSERTION  07/31/2021   Left cataract surgery     POLYPECTOMY  04/14/2019   Procedure: POLYPECTOMY;  Surgeon: Daneil Dolin, MD;  [facility];  Service: Endoscopy;;   POLYPECTOMY  07/25/2021   Procedure:  POLYPECTOMY;  Surgeon: Eloise Harman, DO;  [facility];  Service: Endoscopy;;   TEE WITHOUT CARDIOVERSION N/A 09/20/2021   Procedure: TRANSESOPHAGEAL ECHOCARDIOGRAM (TEE);  Surgeon: Satira Sark, MD;  [facility];  Service: Cardiovascular;  Laterality: N/A;   TRANSFORAMINAL LUMBAR INTERBODY FUSION (TLIF) WITH PEDICLE SCREW FIXATION 1 LEVEL N/A 04/27/2020   Procedure: Transforaminal Lumbar Interbody Fusion Lumbar Five-Sacral One;  Surgeon: Vallarie Mare, MD;  Location: [place];  Service: Neurosurgery;  Laterality: N/A;   VIDEO BRONCHOSCOPY WITH RADIAL ENDOBRONCHIAL ULTRASOUND  06/11/2021   Procedure: VIDEO BRONCHOSCOPY WITH RADIAL ENDOBRONCHIAL ULTRASOUND;  Surgeon: [name], MD;  [facility];  Service: Pulmonary;;    Current Outpatient Medications  Medication Sig Dispense Refill   apixaban (ELIQUIS) 5 MG TABS tablet Take 1 tablet (5 mg total) by mouth 2 (two) times daily. 180 tablet 1   atorvastatin (LIPITOR) 80 MG tablet Take 80 mg by mouth daily.     cyclobenzaprine (FLEXERIL) 10 MG tablet Take 10 mg by mouth 2 (two) times daily as needed for muscle spasms.     folic acid (FOLVITE) 1 MG tablet Take 1 tablet (1 mg total) by mouth daily. 15 tablet 0   furosemide (LASIX) 20 MG tablet Take 1 tablet (20 mg total) by mouth daily as needed. 90 tablet 2   metoprolol succinate (TOPROL-XL) 50 MG 24 hr tablet Take 1 tablet (50 mg total) by mouth daily. 90 tablet 1   No current facility-administered medications for this visit.    Physical Exam BP 98/62   Pulse 80   Resp 20   Ht 5\' 10"  (1.778 m)   Wt 160 lb (72.6 kg)   SpO2 97% Comment: RA  BMI 22.35 kg/m  76 year old man in no acute distress Alert and oriented x3 with no focal deficits Very hard of hearing No carotid bruits.  No cervical or supraclavicular adenopathy Lungs clear bilaterally Cardiac slightly irregular, no murmur No peripheral edema  Diagnostic Tests: CT CHEST WITH CONTRAST    TECHNIQUE: Multidetector CT imaging of the chest was performed during intravenous contrast administration.   RADIATION DOSE REDUCTION: This exam was performed according to the departmental dose-optimization program which includes automated exposure control, adjustment of the mA and/or kV according to patient size and/or use of iterative reconstruction technique.   CONTRAST:  49mL OMNIPAQUE IOHEXOL 300 MG/ML  SOLN   COMPARISON:  PET-CT 06/22/2021.  Chest CT 06/08/2021.   FINDINGS: Cardiovascular: The heart size is normal. No substantial pericardial effusion. Coronary artery calcification is evident. Moderate atherosclerotic calcification is noted in the wall of the thoracic aorta. Left-sided Port-A-Cath tip is positioned at the SVC/RA junction.   Mediastinum/Nodes: No mediastinal lymphadenopathy. 12 mm short axis right hilar lymph node is stable in the interval, likely reactive. No left hilar lymphadenopathy. The esophagus has normal imaging features. There is no axillary lymphadenopathy.   Lungs/Pleura: Subpleural reticulation in the upper lungs bilaterally is similar to prior. Centrilobular and paraseptal emphysema evident. Biapical  pleuroparenchymal scarring again noted. Ground-glass lesion seen posterior right upper lobe has decreased in size since prior studies measuring 9 x 5 mm today on image 62/4 compared to 12 x 11 mm on 06/08/2021. Adjacent fiducial marker again noted. 4 mm subpleural nodule left lower lobe on 96/4 is unchanged. No new suspicious pulmonary nodule or mass although a tiny 2 mm posterior left upper lobe nodule on 79/4 is new since prior. No focal airspace consolidation. There is no evidence of pleural effusion.   Upper Abdomen: Tiny hypoattenuating lesions in the visualized upper kidneys are compatible with cyst. 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 Patient's known abdominal aortic  aneurysm has been incompletely visualized on today's study.   Musculoskeletal: No worrisome lytic or sclerotic osseous abnormality.   IMPRESSION: 1. Interval decrease in size of the ground-glass lesion in the posterior right upper lobe with adjacent fiducial marker. No suspicious new or progressive findings. 2. Tiny 2 mm posterior left upper lobe pulmonary nodule, new since prior. Attention on follow-up recommended. 3. Aortic Atherosclerosis (ICD10-I70.0) and Emphysema (ICD10-J43.9).     Electronically Signed   By: Misty Stanley M.D.   On: 10/09/2021 12:16 I personally reviewed the CT images.  There is been a decrease in the size of the right upper lobe nodule.  Hard to tell with a contrast scan versus a previous noncontrast scan but no dramatic change in size of right hilar adenopathy.  No evidence of mediastinal adenopathy or distant metastases.  2 mm left upper lobe nodule that is new.  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  Stage IIb adenocarcinoma right upper lobe-status post neoadjuvant chemoimmunotherapy.  Good response from the primary nodule, minimal response of right hilar adenopathy.  I do think he is potentially candidate for surgical resection.  He understands that there could be findings that would preclude surgical resection.  He is not a candidate for pneumonectomy due to his impaired diffusion capacity.  He should be able to tolerate a lobectomy.  Tobacco abuse-60 pack years.  Quit 2 months ago and diagnosed.  Atrial fibrillation-new onset about a month ago in the setting of chemoimmunotherapy.  He is on  Eliquis.  Heart rate is normal but rhythm slightly irregular today.  Followed by Dr. Percival Spanish.  Will need to hold Eliquis prior to surgery.  He is aware that surgery could trigger a recurrence of the atrial fibrillation.  Ejection fraction 30%-by echo in August.  Does have a history of CAD.  We will check with Dr. Percival Spanish to see if any additional cardiac testing is needed prior to surgery.  I discussed the proposed surgical procedure with Darryl Ward.  The plan would be to do a robotic assisted right upper lobectomy.  He is aware of the general nature of the procedure including the need for general anesthesia, the incisions to be used, the use of the surgical robot, the use of a drainage tube postoperatively, the expected hospital stay, and the overall recovery.  I informed him of the indications, risks, benefits, and alternatives.  He understands the risks include, but not limited to death, MI, DVT, PE, bleeding, possible need for transfusion,  infection, cardiac arrhythmias, prolonged air leaks, as well as possibility of other unforeseeable complications.  Plan: We will tentatively plan for robotic right upper lobectomy on November 12, 2021. We will need to hold Eliquis 48 hours prior to surgery We will need to check with Dr. Percival Spanish to see if any additional cardiac testing is needed before surgery.  [name], MD [facility] [phone]

## 2021-10-22 ENCOUNTER — Encounter: Payer: Medicare Other | Admitting: Dietician

## 2021-10-22 ENCOUNTER — Other Ambulatory Visit: Payer: Self-pay

## 2021-10-22 ENCOUNTER — Telehealth: Payer: Self-pay | Admitting: Dietician

## 2021-10-22 NOTE — Telephone Encounter (Signed)
Nutrition Assessment:  Reason for Assessment: +MST (wt loss, poor appetite)  Patient with stage II non-small cell lung cancer. He completed neoadjuvant chemoimmunotherapy with carbo/pemetrexed x 4 cycles (7/17-9/12). Plans for right upper lobectomy on 10/23 followed by adjuvant immunotherapy. Patient is under the care of Dr. Delton Coombes.  Past medical history includes COPD, HTN, HLD, MI, CAD, TIA, AAA, PAD, stroke, atrial fibrillation.  Noted 8/28-9/1 hospital admission for afib with RVR  Spoke with patient and wife via telephone. Introduced self and services available at [facility]. He is appreciative of call and agreeable to telephone visit. Wife reports pt is hard of hearing and provides most of history. Patient reports intermittent nausea that is triggered by smell of food cooking. He says he is hungry this morning, however smell of breakfast made him nauseas and did not eat. Pt recalls not eating much of his spaghetti at supper last night. Wife relates this to the grease from hamburger as this occasionally will upset his stomach. He is taking nausea medication as needed. Wife reports pt likes cornbread and buttermilk and does well with smoothies. She makes these with frozen blueberries, bananas, and Equate Original (180 kcal, 10 g protein). Wife reports they live off social security and has to budget for oral supplements due to high cost of these.    Medications: eliquis, lipitor, flexeril, folvite, lasix 20 mg, toprol  Labs: 9/12 reviewed - glucose 110, BUN 25, albumin 3.3  Anthropometrics: Weights decreased 4% (7 lbs) from 167 lb 6.4 oz on 9/12 - significant for time frame (lasix 20 mg PRN for lower extremity swelling noted)  Height: 5'10" Weight: 160 lb (9/28) UBW: 180 lb (April 2022) BMI: 22.96  NUTRITION DIAGNOSIS: Unintentional weight loss related to cancer and associated treatment side effects as evidenced by recent hospital admission, reported intermittent nausea, 4% wt loss in 16 days  which is significant for time frame.    INTERVENTION:  Educated on importance of adequate calories and protein energy intake to maintain strength/weights during treatment as well as to support post-op healing  Discussed strategies for nausea, foods best tolerated and foods to limit - will provide handout  Encouraged small frequent meals/snacks, offered ideas - will provide handout  Suggested adding CIB powder or greek yogurt to smoothie for extra calorie/protein Continue drinking Ensure, suggested switching to Ensure Plus/equivalent - recommend 2-3/day One complimentary case of Ensure Plus HP (350 kcal, 20 g protein) to be provided Wife to pick up Ensure + handouts on 10/5 Contact information given     MONITORING, EVALUATION, GOAL: Patient will tolerate increased calories and protein to promote weight gain prior to surgery   NEXT VISIT: f/u via telephone ~4 weeks (following tentative surgery)

## 2021-10-23 ENCOUNTER — Ambulatory Visit: Payer: Medicare Other | Admitting: Hematology

## 2021-10-23 ENCOUNTER — Ambulatory Visit: Payer: Medicare Other

## 2021-10-23 ENCOUNTER — Other Ambulatory Visit: Payer: Medicare Other

## 2021-10-25 ENCOUNTER — Telehealth: Payer: Self-pay | Admitting: Cardiology

## 2021-10-25 NOTE — Telephone Encounter (Signed)
The is to have a right upper lobectomy on October 23rd.  This will be robot assisted.  I called to speak with him.  His risk is calculated to be 11% for major cardiovascular events.

## 2021-10-26 NOTE — Telephone Encounter (Signed)
The patient is to have right upper lobectomy October 23.  This is robotic assisted.  He is cardiovascular risk for major cardiovascular events given his cardiomyopathy and other comorbidities is 11%.  I called and talked with the patient and his daughter about this.  They understand.  I do not think there is any testing presurgery that needs to be done.  The patient understands the risks and would agree to proceed.  We will certainly await available for any issues such as dysrhythmia.  He had to have his volume watched very carefully given his reduced ejection fraction.

## 2021-10-29 ENCOUNTER — Encounter: Payer: Self-pay | Admitting: *Deleted

## 2021-11-07 ENCOUNTER — Other Ambulatory Visit: Payer: Self-pay

## 2021-11-07 NOTE — Pre-Procedure Instructions (Signed)
Surgical Instructions    Your procedure is scheduled on Monday October 23.  Report to [facility] Main Entrance "A" at 5:30 A.M., then check in with the Admitting office.  Call this number if you have problems the morning of surgery:  [phone]  If you have any questions prior to your surgery date call [phone]: Open Monday-Friday 8am-4pm  If you experience any cold or flu symptoms such as cough, fever, chills, shortness of breath, etc. between now and your scheduled surgery, please notify us at the above number     Remember:  Do not eat or drink after midnight the night before your surgery    Take these medicines the morning of surgery with A SIP OF WATER:  atorvastatin (LIPITOR) metoprolol succinate (TOPROL-XL)  IF NEEDED: cyclobenzaprine (FLEXERIL)   STOP taking apixaban (ELIQUIS) THREE (3) days before surgery. Last dose Thursday 10/19.  As of today, STOP taking any Aspirin (unless otherwise instructed by your surgeon) Aleve, Naproxen, Ibuprofen, Motrin, Advil, Goody's, BC's, all herbal medications, fish oil, and all vitamins.          Do NOT Smoke (Tobacco/Vaping)  24 hours prior to your procedure  If you use a CPAP at night, you may bring your mask for your overnight stay.   Contacts, glasses, hearing aids, dentures or partials may not be worn into surgery, please bring cases for these belongings   For patients admitted to the hospital, discharge time will be determined by your treatment team.   Patients discharged the day of surgery will not be allowed to drive home, and someone needs to stay with them for 24 hours.   SURGICAL WAITING ROOM VISITATION Patients having surgery or a procedure may have no more than 2 support people in the waiting area - these visitors may rotate.   Children under the age of 68 must have an adult with them who is not the patient. If the patient needs to stay at the hospital during part of their recovery, the visitor guidelines for  inpatient rooms apply. Pre-op nurse will coordinate an appropriate time for 1 support person to accompany patient in pre-op.  This support person may not rotate.   Please refer to the [facility] website for the visitor guidelines for Inpatients (after your surgery is over and you are in a regular room).    Special instructions:    Oral Hygiene is also important to reduce your risk of infection.  Remember - BRUSH YOUR TEETH THE MORNING OF SURGERY WITH YOUR REGULAR TOOTHPASTE   [place]- Preparing For Surgery  Before surgery, you can play an important role. Because skin is not sterile, your skin needs to be as free of germs as possible. You can reduce the number of germs on your skin by washing with CHG (chlorahexidine gluconate) Soap before surgery.  CHG is an antiseptic cleaner which kills germs and bonds with the skin to continue killing germs even after washing.     Please do not use if you have an allergy to CHG or antibacterial soaps. If your skin becomes reddened/irritated stop using the CHG.  Do not shave (including legs and underarms) for at least 48 hours prior to first CHG shower. It is OK to shave your face.  Please follow these instructions carefully.     Shower the NIGHT BEFORE SURGERY and the MORNING OF SURGERY with CHG Soap.   If you chose to wash your hair, wash your hair first as usual with your normal shampoo. After you  shampoo, rinse your hair and body thoroughly to remove the shampoo.  Then [employer] and genitals (private parts) with your normal soap and rinse thoroughly to remove soap.  After that Use CHG Soap as you would any other liquid soap. You can apply CHG directly to the skin and wash gently with a scrungie or a clean washcloth.   Apply the CHG Soap to your body ONLY FROM THE NECK DOWN.  Do not use on open wounds or open sores. Avoid contact with your eyes, ears, mouth and genitals (private parts). Wash Face and genitals (private parts)  with your normal  soap.   Wash thoroughly, paying special attention to the area where your surgery will be performed.  Thoroughly rinse your body with warm water from the neck down.  DO NOT shower/wash with your normal soap after using and rinsing off the CHG Soap.  Pat yourself dry with a CLEAN TOWEL.  Wear CLEAN PAJAMAS to bed the night before surgery  Place CLEAN SHEETS on your bed the night before your surgery  DO NOT SLEEP WITH PETS.   Day of Surgery:  Take a shower with CHG soap. Wear Clean/Comfortable clothing the morning of surgery Remember to brush your teeth WITH YOUR REGULAR TOOTHPASTE. Do not wear jewelry or makeup. Do not wear lotions, powders, cologne or deodorant. Men may shave face and neck. Do not bring valuables to the hospital.  [facility] is not responsible for any belongings or valuables.    If you received a COVID test during your pre-op visit, it is requested that you wear a mask when out in public, stay away from anyone that may not be feeling well, and notify your surgeon if you develop symptoms. If you have been in contact with anyone that has tested positive in the last 10 days, please notify your surgeon.    Please read over the following fact sheets that you were given.

## 2021-11-08 ENCOUNTER — Encounter (HOSPITAL_COMMUNITY)
Admission: RE | Admit: 2021-11-08 | Discharge: 2021-11-08 | Disposition: A | Discharge status: Home / Self Care | Payer: Medicare Other | Source: Ambulatory Visit | Attending: Thoracic Surgery (Cardiothoracic Vascular Surgery) | Admitting: Thoracic Surgery (Cardiothoracic Vascular Surgery)

## 2021-11-08 ENCOUNTER — Encounter (HOSPITAL_COMMUNITY): Payer: Self-pay

## 2021-11-08 ENCOUNTER — Other Ambulatory Visit: Payer: Self-pay

## 2021-11-08 VITALS — BP 122/70 | HR 76 | Temp 98.3°F | Resp 18 | Ht 70.0 in | Wt 161.2 lb

## 2021-11-08 DIAGNOSIS — Z1152 Encounter for screening for COVID-19: Secondary | ICD-10-CM | POA: Insufficient documentation

## 2021-11-08 DIAGNOSIS — Z01818 Encounter for other preprocedural examination: Secondary | ICD-10-CM | POA: Diagnosis not present

## 2021-11-08 DIAGNOSIS — C3411 Malignant neoplasm of upper lobe, right bronchus or lung: Secondary | ICD-10-CM | POA: Diagnosis not present

## 2021-11-08 DIAGNOSIS — I4510 Unspecified right bundle-branch block: Secondary | ICD-10-CM | POA: Diagnosis not present

## 2021-11-08 DIAGNOSIS — I491 Atrial premature depolarization: Secondary | ICD-10-CM | POA: Insufficient documentation

## 2021-11-08 LAB — CBC
HCT: 26.4 % — ABNORMAL LOW (ref 39.0–52.0)
Hemoglobin: 8.5 g/dL — ABNORMAL LOW (ref 13.0–17.0)
MCH: 34.7 pg — ABNORMAL HIGH (ref 26.0–34.0)
MCHC: 32.2 g/dL (ref 30.0–36.0)
MCV: 107.8 fL — ABNORMAL HIGH (ref 80.0–100.0)
Platelets: 151 10*3/uL (ref 150–400)
RBC: 2.45 MIL/uL — ABNORMAL LOW (ref 4.22–5.81)
RDW: 21.4 % — ABNORMAL HIGH (ref 11.5–15.5)
WBC: 7.8 10*3/uL (ref 4.0–10.5)
nRBC: 0 % (ref 0.0–0.2)

## 2021-11-08 LAB — URINALYSIS, ROUTINE W REFLEX MICROSCOPIC
Bilirubin Urine: NEGATIVE
Glucose, UA: NEGATIVE mg/dL
Hgb urine dipstick: NEGATIVE
Ketones, ur: NEGATIVE mg/dL
Leukocytes,Ua: NEGATIVE
Nitrite: NEGATIVE
Protein, ur: NEGATIVE mg/dL
Specific Gravity, Urine: 1.024 (ref 1.005–1.030)
pH: 5 (ref 5.0–8.0)

## 2021-11-08 LAB — COMPREHENSIVE METABOLIC PANEL
ALT: 13 U/L (ref 0–44)
AST: 21 U/L (ref 15–41)
Albumin: 3.1 g/dL — ABNORMAL LOW (ref 3.5–5.0)
Alkaline Phosphatase: 54 U/L (ref 38–126)
Anion gap: 6 (ref 5–15)
BUN: 13 mg/dL (ref 8–23)
CO2: 22 mmol/L (ref 22–32)
Calcium: 9 mg/dL (ref 8.9–10.3)
Chloride: 112 mmol/L — ABNORMAL HIGH (ref 98–111)
Creatinine, Ser: 1.05 mg/dL (ref 0.61–1.24)
GFR, Estimated: >60 mL/min (ref >60)
Glucose, Bld: 117 mg/dL — ABNORMAL HIGH (ref 70–99)
Potassium: 3.8 mmol/L (ref 3.5–5.1)
Sodium: 140 mmol/L (ref 135–145)
Total Bilirubin: 0.9 mg/dL (ref 0.3–1.2)
Total Protein: 6.3 g/dL — ABNORMAL LOW (ref 6.5–8.1)

## 2021-11-08 LAB — BLOOD GAS, ARTERIAL
Acid-base deficit: 1.1 mmol/L (ref 0.0–2.0)
Bicarbonate: 22.4 mmol/L (ref 20.0–28.0)
Drawn by: 6643
O2 Saturation: 99.7 %
Patient temperature: 37
pCO2 arterial: 33 mmHg (ref 32–48)
pH, Arterial: 7.44 (ref 7.35–7.45)
pO2, Arterial: 150 mmHg — ABNORMAL HIGH (ref 83–108)

## 2021-11-08 LAB — SURGICAL PCR SCREEN
MRSA, PCR: NEGATIVE
Staphylococcus aureus: NEGATIVE

## 2021-11-08 LAB — PROTIME-INR
INR: 1.5 — ABNORMAL HIGH (ref 0.8–1.2)
Prothrombin Time: 17.7 seconds — ABNORMAL HIGH (ref 11.4–15.2)

## 2021-11-08 LAB — APTT: aPTT: 30 seconds (ref 24–36)

## 2021-11-08 LAB — SARS CORONAVIRUS 2 (TAT 6-24 HRS): SARS Coronavirus 2: NEGATIVE

## 2021-11-08 NOTE — Progress Notes (Addendum)
PCP - [name], NP Cardiologist -   Minus Breeding, MD  - clearance note in epic  PPM/ICD - denies   Chest x-ray - DOS- within 72 hours of surgery EKG - 11/08/2021  Stress Test - 2013 ECHO - 09/10/21 Cardiac Cath - denies  Sleep Study - denies   Blood Thinner Instructions: Hold Eliquis for 48 hours prior to surgery- Last dose 11/09/21   ERAS Protcol - NPO order   COVID TEST- 11/08/2021    Anesthesia review: EF 30-35%, cardiac history. Abnormal labs- Hgb 8.5. Levonne Spiller, RN notified.   Patient denies shortness of breath, fever, cough and chest pain at PAT appointment   All instructions explained to the patient, with a verbal understanding of the material. Patient agrees to go over the instructions while at home for a better understanding. Patient also instructed to self quarantine after being tested for COVID-19. The opportunity to ask questions was provided.

## 2021-11-08 NOTE — Pre-Procedure Instructions (Signed)
Surgical Instructions    Your procedure is scheduled on Monday October 23.  Report to [facility] Main Entrance "A" at 5:30 A.M., then check in with the Admitting office.  Call this number if you have problems the morning of surgery:  [phone]  If you have any questions prior to your surgery date call [phone]: Open Monday-Friday 8am-4pm  If you experience any cold or flu symptoms such as cough, fever, chills, shortness of breath, etc. between now and your scheduled surgery, please notify us at the above number     Remember:  Do not eat or drink after midnight the night before your surgery    Take these medicines the morning of surgery with A SIP OF WATER:  atorvastatin (LIPITOR) metoprolol succinate (TOPROL-XL)  IF NEEDED: cyclobenzaprine ([place]ellow Medicine)   Per your surgeons instructions, STOP taking apixaban (ELIQUIS) 48 hours prior to surgery. Last dose Thursday 10/20.  As of today, STOP taking any Aspirin (unless otherwise instructed by your surgeon) Aleve, Naproxen, Ibuprofen, Motrin, Advil, Goody's, BC's, all herbal medications, fish oil, and all vitamins.          Do NOT Smoke (Tobacco/Vaping)  24 hours prior to your procedure  If you use a CPAP at night, you may bring your mask for your overnight stay.   Contacts, glasses, hearing aids, dentures or partials may not be worn into surgery, please bring cases for these belongings   For patients admitted to the hospital, discharge time will be determined by your treatment team.   Patients discharged the day of surgery will not be allowed to drive home, and someone needs to stay with them for 24 hours.   SURGICAL WAITING ROOM VISITATION Patients having surgery or a procedure may have no more than 2 support people in the waiting area - these visitors may rotate.   Children under the age of 38 must have an adult with them who is not the patient. If the patient needs to stay at the hospital during part of their recovery, the  visitor guidelines for inpatient rooms apply. Pre-op nurse will coordinate an appropriate time for 1 support person to accompany patient in pre-op.  This support person may not rotate.   Please refer to the [facility] website for the visitor guidelines for Inpatients (after your surgery is over and you are in a regular room).    Special instructions:    Oral Hygiene is also important to reduce your risk of infection.  Remember - BRUSH YOUR TEETH THE MORNING OF SURGERY WITH YOUR REGULAR TOOTHPASTE   [place]- Preparing For Surgery  Before surgery, you can play an important role. Because skin is not sterile, your skin needs to be as free of germs as possible. You can reduce the number of germs on your skin by washing with CHG (chlorahexidine gluconate) Soap before surgery.  CHG is an antiseptic cleaner which kills germs and bonds with the skin to continue killing germs even after washing.     Please do not use if you have an allergy to CHG or antibacterial soaps. If your skin becomes reddened/irritated stop using the CHG.  Do not shave (including legs and underarms) for at least 48 hours prior to first CHG shower. It is OK to shave your face.  Please follow these instructions carefully.     Shower the NIGHT BEFORE SURGERY and the MORNING OF SURGERY with CHG Soap.   If you chose to wash your hair, wash your hair first as usual with your  normal shampoo. After you shampoo, rinse your hair and body thoroughly to remove the shampoo.  Then [employer] and genitals (private parts) with your normal soap and rinse thoroughly to remove soap.  After that Use CHG Soap as you would any other liquid soap. You can apply CHG directly to the skin and wash gently with a scrungie or a clean washcloth.   Apply the CHG Soap to your body ONLY FROM THE NECK DOWN.  Do not use on open wounds or open sores. Avoid contact with your eyes, ears, mouth and genitals (private parts). Wash Face and genitals (private parts)   with your normal soap.   Wash thoroughly, paying special attention to the area where your surgery will be performed.  Thoroughly rinse your body with warm water from the neck down.  DO NOT shower/wash with your normal soap after using and rinsing off the CHG Soap.  Pat yourself dry with a CLEAN TOWEL.  Wear CLEAN PAJAMAS to bed the night before surgery  Place CLEAN SHEETS on your bed the night before your surgery  DO NOT SLEEP WITH PETS.   Day of Surgery:  Take a shower with CHG soap. Wear Clean/Comfortable clothing the morning of surgery Remember to brush your teeth WITH YOUR REGULAR TOOTHPASTE. Do not wear jewelry or makeup. Do not wear lotions, powders, cologne or deodorant. Men may shave face and neck. Do not bring valuables to the hospital.  [facility] is not responsible for any belongings or valuables.    If you received a COVID test during your pre-op visit, it is requested that you wear a mask when out in public, stay away from anyone that may not be feeling well, and notify your surgeon if you develop symptoms. If you have been in contact with anyone that has tested positive in the last 10 days, please notify your surgeon.    Please read over the following fact sheets that you were given.

## 2021-11-09 ENCOUNTER — Encounter (HOSPITAL_COMMUNITY): Payer: Self-pay

## 2021-11-09 NOTE — Anesthesia Preprocedure Evaluation (Signed)
Anesthesia Evaluation  Patient identified by MRN, date of birth, ID band Patient awake    Reviewed: Allergy & Precautions, H&P , NPO status , Patient's Chart, lab work & pertinent test results  Airway Mallampati: II   Neck ROM: full    Dental   Pulmonary COPD, former smoker,  RUL adeno CA   breath sounds clear to auscultation       Cardiovascular hypertension, + CAD, + Past MI and + Peripheral Vascular Disease  + dysrhythmias Atrial Fibrillation  Rhythm:regular Rate:Normal     Neuro/Psych TIACVA    GI/Hepatic   Endo/Other    Renal/GU      Musculoskeletal  (+) Arthritis ,   Abdominal   Peds  Hematology  (+) Blood dyscrasia, anemia ,   Anesthesia Other Findings   Reproductive/Obstetrics                            Anesthesia Physical Anesthesia Plan  ASA: 3  Anesthesia Plan: General   Post-op Pain Management:    Induction: Intravenous  PONV Risk Score and Plan: 2 and Ondansetron, Dexamethasone, Midazolam and Treatment may vary due to age or medical condition  Airway Management Planned: Double Lumen EBT  Additional Equipment: Arterial line  Intra-op Plan:   Post-operative Plan: Extubation in OR  Informed Consent: I have reviewed the patients History and Physical, chart, labs and discussed the procedure including the risks, benefits and alternatives for the proposed anesthesia with the patient or authorized representative who has indicated his/her understanding and acceptance.     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  Preoperative stratification per telephone encounter by Dr.  Percival Spanish 10/26/2021, "The patient is to have right upper lobectomy October 23. This is robotic assisted. He is cardiovascular risk for major cardiovascular events given his cardiomyopathy and other comorbidities is 11%. I called and talked with the patient and his daughter about this. They understand. I do not think there is any testing presurgery that needs to be done.The patient understands the risks and would agree to proceed. We will certainly await available for any issues such as dysrhythmia. He had to have his volume watched very carefully given his reduced ejection fraction."  Patient reports last dose Eliquis 11/09/2021.  Former smoker with associated COPD.  60-pack-year history, quit ~3 months ago.  EKG 11/08/2021: Sinus rhythm with Premature atrial complexes.  Rate 65. Incomplete right bundle branch block  CT chest 10/08/2021: IMPRESSION: 1. Interval decrease in size of the ground-glass lesion in the posterior right upper lobe with adjacent fiducial marker. No suspicious new or progressive findings. 2. Tiny 2 mm posterior left upper lobe pulmonary nodule, new since prior. Attention on follow-up recommended. 3. Aortic Atherosclerosis (ICD10-I70.0) and Emphysema (ICD10-J43.9).  TEE 09/20/2021: 1. Limited study in terms of color Doppler and zooming capabilies of  echocardiographic work station.  2. Left ventricular ejection fraction, by estimation, is 30 to 35%. The  left ventricle has moderately decreased function. The left ventricle  demonstrates global hypokinesis.  3. Right ventricular systolic function is mildly reduced. The right  ventricular size is normal.  4. Left atrial size was moderately dilated. No left atrial/left atrial  appendage thrombus was detected.  5.  Right atrial size was moderately dilated.  6. The mitral valve is grossly normal. Mild mitral valve regurgitation.  7. Tricuspid valve regurgitation is mild to moderate.  8. The aortic valve is  tricuspid. Aortic valve regurgitation is not  visualized.  9. There is Moderate (Grade III) atheroma plaque involving the descending  aorta.  )       Anesthesia Quick Evaluation

## 2021-11-09 NOTE — Progress Notes (Signed)
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  Preoperative stratification per telephone encounter by Dr. Percival Spanish 10/26/2021, "The patient is to have right upper lobectomy October 23.  This is robotic assisted.  He is cardiovascular risk for major cardiovascular events given his cardiomyopathy and other comorbidities is 11%.  I called and talked with the patient and his daughter about this.  They understand.  I do not think there is any testing presurgery that needs to be done.  The patient understands the risks and would agree to proceed.  We will certainly await available for any issues such as dysrhythmia.  He had to have his volume watched very carefully given his reduced ejection fraction."  Patient reports last dose Eliquis 11/09/2021.  Former smoker with associated COPD.  60-pack-year history, quit ~3 months ago.  EKG 11/08/2021: Sinus rhythm with Premature atrial complexes.  Rate 65. Incomplete right bundle branch block  CT chest 10/08/2021: IMPRESSION: 1. Interval decrease in size of the ground-glass lesion in the posterior right upper lobe with adjacent fiducial marker. No suspicious new or progressive findings. 2. Tiny 2 mm posterior left upper lobe pulmonary nodule, new since prior. Attention on follow-up recommended. 3. Aortic Atherosclerosis (ICD10-I70.0) and Emphysema (ICD10-J43.9).  TEE 09/20/2021:  1. Limited study in terms of color Doppler and zooming capabilies of  echocardiographic work station.   2. Left ventricular ejection fraction, by estimation, is 30 to 35%. The  left ventricle has moderately decreased function. The left ventricle  demonstrates global hypokinesis.   3. Right ventricular systolic function is mildly reduced. The right  ventricular size is normal.   4. Left atrial  size was moderately dilated. No left atrial/left atrial  appendage thrombus was detected.   5. Right atrial size was moderately dilated.   6. The mitral valve is grossly normal. Mild mitral valve regurgitation.   7. Tricuspid valve regurgitation is mild to moderate.   8. The aortic valve is tricuspid. Aortic valve regurgitation is not  visualized.   9. There is Moderate (Grade III) atheroma plaque involving the descending  aorta.     [facility] Short Stay Center/Anesthesiology Phone [phone] 11/09/2021 9:54 AM

## 2021-11-12 ENCOUNTER — Encounter (HOSPITAL_COMMUNITY)
Admission: RE | Disposition: A | Discharge status: Home Health | Payer: Self-pay | Source: Home / Self Care | Attending: Thoracic Surgery (Cardiothoracic Vascular Surgery)

## 2021-11-12 ENCOUNTER — Other Ambulatory Visit: Payer: Self-pay

## 2021-11-12 ENCOUNTER — Encounter (HOSPITAL_COMMUNITY): Payer: Self-pay | Admitting: Thoracic Surgery (Cardiothoracic Vascular Surgery)

## 2021-11-12 ENCOUNTER — Inpatient Hospital Stay (HOSPITAL_COMMUNITY): Payer: Medicare Other | Admitting: Certified Registered"

## 2021-11-12 ENCOUNTER — Inpatient Hospital Stay (HOSPITAL_COMMUNITY): Payer: Medicare Other | Admitting: Physician Assistant

## 2021-11-12 ENCOUNTER — Inpatient Hospital Stay (HOSPITAL_COMMUNITY)
Admission: RE | Admit: 2021-11-12 | Discharge: 2021-11-28 | DRG: 164 | Disposition: A | Discharge status: Home Health | Payer: Medicare Other | Attending: Thoracic Surgery (Cardiothoracic Vascular Surgery) | Admitting: Thoracic Surgery (Cardiothoracic Vascular Surgery)

## 2021-11-12 ENCOUNTER — Inpatient Hospital Stay (HOSPITAL_COMMUNITY): Payer: Medicare Other

## 2021-11-12 DIAGNOSIS — D696 Thrombocytopenia, unspecified: Secondary | ICD-10-CM | POA: Diagnosis not present

## 2021-11-12 DIAGNOSIS — Z86010 Personal history of colonic polyps: Secondary | ICD-10-CM | POA: Diagnosis not present

## 2021-11-12 DIAGNOSIS — J939 Pneumothorax, unspecified: Secondary | ICD-10-CM | POA: Diagnosis not present

## 2021-11-12 DIAGNOSIS — Z7901 Long term (current) use of anticoagulants: Secondary | ICD-10-CM | POA: Diagnosis not present

## 2021-11-12 DIAGNOSIS — Z8673 Personal history of transient ischemic attack (TIA), and cerebral infarction without residual deficits: Secondary | ICD-10-CM

## 2021-11-12 DIAGNOSIS — J95811 Postprocedural pneumothorax: Secondary | ICD-10-CM | POA: Diagnosis not present

## 2021-11-12 DIAGNOSIS — Z9842 Cataract extraction status, left eye: Secondary | ICD-10-CM

## 2021-11-12 DIAGNOSIS — T8182XA Emphysema (subcutaneous) resulting from a procedure, initial encounter: Secondary | ICD-10-CM | POA: Diagnosis not present

## 2021-11-12 DIAGNOSIS — I4819 Other persistent atrial fibrillation: Secondary | ICD-10-CM | POA: Diagnosis not present

## 2021-11-12 DIAGNOSIS — Z87891 Personal history of nicotine dependence: Secondary | ICD-10-CM | POA: Diagnosis not present

## 2021-11-12 DIAGNOSIS — C3411 Malignant neoplasm of upper lobe, right bronchus or lung: Secondary | ICD-10-CM

## 2021-11-12 DIAGNOSIS — Z8719 Personal history of other diseases of the digestive system: Secondary | ICD-10-CM

## 2021-11-12 DIAGNOSIS — E785 Hyperlipidemia, unspecified: Secondary | ICD-10-CM | POA: Diagnosis not present

## 2021-11-12 DIAGNOSIS — Z79899 Other long term (current) drug therapy: Secondary | ICD-10-CM

## 2021-11-12 DIAGNOSIS — J9811 Atelectasis: Secondary | ICD-10-CM | POA: Diagnosis not present

## 2021-11-12 DIAGNOSIS — Y92239 Unspecified place in hospital as the place of occurrence of the external cause: Secondary | ICD-10-CM | POA: Diagnosis not present

## 2021-11-12 DIAGNOSIS — I2510 Atherosclerotic heart disease of native coronary artery without angina pectoris: Secondary | ICD-10-CM | POA: Diagnosis present

## 2021-11-12 DIAGNOSIS — Z87448 Personal history of other diseases of urinary system: Secondary | ICD-10-CM | POA: Diagnosis not present

## 2021-11-12 DIAGNOSIS — D491 Neoplasm of unspecified behavior of respiratory system: Secondary | ICD-10-CM | POA: Diagnosis present

## 2021-11-12 DIAGNOSIS — Z86011 Personal history of benign neoplasm of the brain: Secondary | ICD-10-CM

## 2021-11-12 DIAGNOSIS — J9382 Other air leak: Secondary | ICD-10-CM | POA: Diagnosis not present

## 2021-11-12 DIAGNOSIS — I70209 Unspecified atherosclerosis of native arteries of extremities, unspecified extremity: Secondary | ICD-10-CM | POA: Diagnosis not present

## 2021-11-12 DIAGNOSIS — J439 Emphysema, unspecified: Secondary | ICD-10-CM | POA: Diagnosis not present

## 2021-11-12 DIAGNOSIS — I10 Essential (primary) hypertension: Secondary | ICD-10-CM

## 2021-11-12 DIAGNOSIS — J95812 Postprocedural air leak: Secondary | ICD-10-CM | POA: Diagnosis not present

## 2021-11-12 DIAGNOSIS — I110 Hypertensive heart disease with heart failure: Secondary | ICD-10-CM | POA: Diagnosis present

## 2021-11-12 DIAGNOSIS — I252 Old myocardial infarction: Secondary | ICD-10-CM

## 2021-11-12 DIAGNOSIS — I5042 Chronic combined systolic (congestive) and diastolic (congestive) heart failure: Secondary | ICD-10-CM | POA: Diagnosis present

## 2021-11-12 DIAGNOSIS — Z9221 Personal history of antineoplastic chemotherapy: Secondary | ICD-10-CM

## 2021-11-12 DIAGNOSIS — Y836 Removal of other organ (partial) (total) as the cause of abnormal reaction of the patient, or of later complication, without mention of misadventure at the time of the procedure: Secondary | ICD-10-CM | POA: Diagnosis not present

## 2021-11-12 DIAGNOSIS — D62 Acute posthemorrhagic anemia: Secondary | ICD-10-CM | POA: Diagnosis not present

## 2021-11-12 DIAGNOSIS — Z981 Arthrodesis status: Secondary | ICD-10-CM

## 2021-11-12 DIAGNOSIS — J449 Chronic obstructive pulmonary disease, unspecified: Secondary | ICD-10-CM | POA: Diagnosis not present

## 2021-11-12 DIAGNOSIS — M5416 Radiculopathy, lumbar region: Secondary | ICD-10-CM | POA: Diagnosis present

## 2021-11-12 DIAGNOSIS — M1990 Unspecified osteoarthritis, unspecified site: Secondary | ICD-10-CM | POA: Diagnosis present

## 2021-11-12 DIAGNOSIS — D630 Anemia in neoplastic disease: Secondary | ICD-10-CM | POA: Diagnosis not present

## 2021-11-12 DIAGNOSIS — Z8679 Personal history of other diseases of the circulatory system: Secondary | ICD-10-CM

## 2021-11-12 DIAGNOSIS — I509 Heart failure, unspecified: Secondary | ICD-10-CM | POA: Diagnosis not present

## 2021-11-12 HISTORY — PX: LYMPH NODE DISSECTION: SHX5087

## 2021-11-12 HISTORY — PX: INTERCOSTAL NERVE BLOCK: SHX5021

## 2021-11-12 SURGERY — LOBECTOMY, LUNG, ROBOT-ASSISTED, USING VATS
Anesthesia: General | Site: Chest | Laterality: Right

## 2021-11-12 MED ORDER — EPHEDRINE 5 MG/ML INJ
INTRAVENOUS | Status: AC
  Filled 2021-11-12: qty 5

## 2021-11-12 MED ORDER — CHLORHEXIDINE GLUCONATE 0.12 % MT SOLN
OROMUCOSAL | Status: AC
  Administered 2021-11-12: 15 mL via OROMUCOSAL
  Filled 2021-11-12: qty 15

## 2021-11-12 MED ORDER — ROCURONIUM BROMIDE 10 MG/ML (PF) SYRINGE
PREFILLED_SYRINGE | INTRAVENOUS | Status: AC
  Filled 2021-11-12: qty 10

## 2021-11-12 MED ORDER — METOPROLOL SUCCINATE ER 50 MG PO TB24
50.0000 mg | ORAL_TABLET | Freq: Every day | ORAL | Status: DC
  Administered 2021-11-13 – 2021-11-14 (×2): 50 mg via ORAL
  Filled 2021-11-12 (×3): qty 1

## 2021-11-12 MED ORDER — PHENYLEPHRINE 80 MCG/ML (10ML) SYRINGE FOR IV PUSH (FOR BLOOD PRESSURE SUPPORT)
PREFILLED_SYRINGE | INTRAVENOUS | Status: DC | PRN
  Administered 2021-11-12: 160 ug via INTRAVENOUS
  Administered 2021-11-12: 80 ug via INTRAVENOUS

## 2021-11-12 MED ORDER — CHLORHEXIDINE GLUCONATE 0.12 % MT SOLN: 15.0000 mL | Freq: Once | OROMUCOSAL | Status: AC

## 2021-11-12 MED ORDER — CEFAZOLIN SODIUM-DEXTROSE 2-4 GM/100ML-% IV SOLN
2.0000 g | Freq: Three times a day (TID) | INTRAVENOUS | Status: AC
  Administered 2021-11-12 – 2021-11-13 (×2): 2 g via INTRAVENOUS
  Filled 2021-11-12 (×2): qty 100

## 2021-11-12 MED ORDER — LACTATED RINGERS IV SOLN: INTRAVENOUS | Status: DC

## 2021-11-12 MED ORDER — PROPOFOL 10 MG/ML IV BOLUS
INTRAVENOUS | Status: AC
  Filled 2021-11-12: qty 20

## 2021-11-12 MED ORDER — EPHEDRINE SULFATE-NACL 50-0.9 MG/10ML-% IV SOSY
PREFILLED_SYRINGE | INTRAVENOUS | Status: DC | PRN
  Administered 2021-11-12: 10 mg via INTRAVENOUS

## 2021-11-12 MED ORDER — ENOXAPARIN SODIUM 40 MG/0.4ML IJ SOSY
40.0000 mg | PREFILLED_SYRINGE | Freq: Every day | INTRAMUSCULAR | Status: DC
  Administered 2021-11-14: 40 mg via SUBCUTANEOUS
  Filled 2021-11-12: qty 0.4

## 2021-11-12 MED ORDER — OXYCODONE HCL 5 MG/5ML PO SOLN: 5.0000 mg | Freq: Once | ORAL | Status: DC | PRN

## 2021-11-12 MED ORDER — ROCURONIUM BROMIDE 10 MG/ML (PF) SYRINGE
PREFILLED_SYRINGE | INTRAVENOUS | Status: DC | PRN
  Administered 2021-11-12: 20 mg via INTRAVENOUS
  Administered 2021-11-12 (×2): 50 mg via INTRAVENOUS

## 2021-11-12 MED ORDER — ONDANSETRON HCL 4 MG/2ML IJ SOLN: 4.0000 mg | Freq: Four times a day (QID) | INTRAMUSCULAR | Status: DC | PRN

## 2021-11-12 MED ORDER — TRAMADOL HCL 50 MG PO TABS: 50.0000 mg | ORAL_TABLET | Freq: Four times a day (QID) | ORAL | Status: DC | PRN

## 2021-11-12 MED ORDER — POTASSIUM CHLORIDE IN NACL 20-0.9 MEQ/L-% IV SOLN
INTRAVENOUS | Status: DC
  Filled 2021-11-12 (×2): qty 1000

## 2021-11-12 MED ORDER — FENTANYL CITRATE (PF) 250 MCG/5ML IJ SOLN
INTRAMUSCULAR | Status: DC | PRN
  Administered 2021-11-12 (×2): 50 ug via INTRAVENOUS
  Administered 2021-11-12: 100 ug via INTRAVENOUS
  Administered 2021-11-12: 50 ug via INTRAVENOUS

## 2021-11-12 MED ORDER — PANTOPRAZOLE SODIUM 40 MG PO TBEC
40.0000 mg | DELAYED_RELEASE_TABLET | Freq: Every day | ORAL | Status: DC
  Administered 2021-11-13 – 2021-11-28 (×15): 40 mg via ORAL
  Filled 2021-11-12 (×17): qty 1

## 2021-11-12 MED ORDER — ONDANSETRON HCL 4 MG/2ML IJ SOLN
INTRAMUSCULAR | Status: DC | PRN
  Administered 2021-11-12: 4 mg via INTRAVENOUS

## 2021-11-12 MED ORDER — SUGAMMADEX SODIUM 200 MG/2ML IV SOLN
INTRAVENOUS | Status: DC | PRN
  Administered 2021-11-12: 200 mg via INTRAVENOUS

## 2021-11-12 MED ORDER — OXYCODONE HCL 5 MG PO TABS
5.0000 mg | ORAL_TABLET | ORAL | Status: DC | PRN
  Administered 2021-11-13: 5 mg via ORAL
  Administered 2021-11-22: 10 mg via ORAL
  Filled 2021-11-12: qty 2
  Filled 2021-11-12: qty 1

## 2021-11-12 MED ORDER — CYCLOBENZAPRINE HCL 10 MG PO TABS: 10.0000 mg | ORAL_TABLET | Freq: Two times a day (BID) | ORAL | Status: DC | PRN

## 2021-11-12 MED ORDER — ORAL CARE MOUTH RINSE: 15.0000 mL | Freq: Once | OROMUCOSAL | Status: AC

## 2021-11-12 MED ORDER — SODIUM CHLORIDE 0.9% IV SOLUTION
INTRAVENOUS | Status: AC | PRN
  Administered 2021-11-12: 1000 mL via INTRAMUSCULAR

## 2021-11-12 MED ORDER — FOLIC ACID 1 MG PO TABS
1.0000 mg | ORAL_TABLET | Freq: Every day | ORAL | Status: DC
  Administered 2021-11-13 – 2021-11-28 (×15): 1 mg via ORAL
  Filled 2021-11-12 (×17): qty 1

## 2021-11-12 MED ORDER — OXYCODONE HCL 5 MG PO TABS: 5.0000 mg | ORAL_TABLET | Freq: Once | ORAL | Status: DC | PRN

## 2021-11-12 MED ORDER — 0.9 % SODIUM CHLORIDE (POUR BTL) OPTIME
TOPICAL | Status: DC | PRN
  Administered 2021-11-12: 1000 mL

## 2021-11-12 MED ORDER — HEMOSTATIC AGENTS (NO CHARGE) OPTIME
TOPICAL | Status: DC | PRN
  Administered 2021-11-12: 1 via TOPICAL

## 2021-11-12 MED ORDER — SENNOSIDES-DOCUSATE SODIUM 8.6-50 MG PO TABS
1.0000 | ORAL_TABLET | Freq: Every day | ORAL | Status: DC
  Administered 2021-11-12 – 2021-11-24 (×10): 1 via ORAL
  Filled 2021-11-12 (×11): qty 1

## 2021-11-12 MED ORDER — BISACODYL 5 MG PO TBEC
10.0000 mg | DELAYED_RELEASE_TABLET | Freq: Every day | ORAL | Status: DC
  Administered 2021-11-14 – 2021-11-28 (×11): 10 mg via ORAL
  Filled 2021-11-12 (×15): qty 2

## 2021-11-12 MED ORDER — CEFAZOLIN SODIUM-DEXTROSE 2-4 GM/100ML-% IV SOLN
2.0000 g | INTRAVENOUS | Status: AC
  Administered 2021-11-12: 2 g via INTRAVENOUS
  Filled 2021-11-12: qty 100

## 2021-11-12 MED ORDER — DEXAMETHASONE SODIUM PHOSPHATE 10 MG/ML IJ SOLN
INTRAMUSCULAR | Status: AC
  Filled 2021-11-12: qty 1

## 2021-11-12 MED ORDER — BUPIVACAINE HCL (PF) 0.5 % IJ SOLN
INTRAMUSCULAR | Status: AC
  Filled 2021-11-12: qty 30

## 2021-11-12 MED ORDER — PROPOFOL 10 MG/ML IV BOLUS
INTRAVENOUS | Status: DC | PRN
  Administered 2021-11-12: 160 mg via INTRAVENOUS

## 2021-11-12 MED ORDER — MIDAZOLAM HCL 2 MG/2ML IJ SOLN
INTRAMUSCULAR | Status: DC | PRN
  Administered 2021-11-12: 2 mg via INTRAVENOUS

## 2021-11-12 MED ORDER — MIDAZOLAM HCL 2 MG/2ML IJ SOLN
INTRAMUSCULAR | Status: AC
  Filled 2021-11-12: qty 2

## 2021-11-12 MED ORDER — ACETAMINOPHEN 160 MG/5ML PO SOLN
1000.0000 mg | Freq: Four times a day (QID) | ORAL | Status: AC
  Administered 2021-11-17 (×2): 1000 mg via ORAL
  Filled 2021-11-12 (×4): qty 40.6

## 2021-11-12 MED ORDER — DEXAMETHASONE SODIUM PHOSPHATE 10 MG/ML IJ SOLN
INTRAMUSCULAR | Status: DC | PRN
  Administered 2021-11-12: 10 mg via INTRAVENOUS

## 2021-11-12 MED ORDER — HYDROMORPHONE HCL 1 MG/ML IJ SOLN
INTRAMUSCULAR | Status: DC | PRN
  Administered 2021-11-12: .5 mg via INTRAVENOUS

## 2021-11-12 MED ORDER — FENTANYL CITRATE (PF) 250 MCG/5ML IJ SOLN
INTRAMUSCULAR | Status: AC
  Filled 2021-11-12: qty 5

## 2021-11-12 MED ORDER — ATORVASTATIN CALCIUM 80 MG PO TABS
80.0000 mg | ORAL_TABLET | Freq: Every day | ORAL | Status: DC
  Administered 2021-11-13 – 2021-11-28 (×15): 80 mg via ORAL
  Filled 2021-11-12 (×17): qty 1

## 2021-11-12 MED ORDER — HYDROMORPHONE HCL 1 MG/ML IJ SOLN
INTRAMUSCULAR | Status: AC
  Filled 2021-11-12: qty 0.5

## 2021-11-12 MED ORDER — FENTANYL CITRATE PF 50 MCG/ML IJ SOSY: 25.0000 ug | PREFILLED_SYRINGE | INTRAMUSCULAR | Status: DC | PRN

## 2021-11-12 MED ORDER — FENTANYL CITRATE (PF) 100 MCG/2ML IJ SOLN: 25.0000 ug | INTRAMUSCULAR | Status: DC | PRN

## 2021-11-12 MED ORDER — KETOROLAC TROMETHAMINE 15 MG/ML IJ SOLN
15.0000 mg | Freq: Four times a day (QID) | INTRAMUSCULAR | Status: AC
  Administered 2021-11-12 – 2021-11-14 (×8): 15 mg via INTRAVENOUS
  Filled 2021-11-12 (×8): qty 1

## 2021-11-12 MED ORDER — BUPIVACAINE LIPOSOME 1.3 % IJ SUSP
INTRAMUSCULAR | Status: AC
  Filled 2021-11-12: qty 20

## 2021-11-12 MED ORDER — ONDANSETRON HCL 4 MG/2ML IJ SOLN
INTRAMUSCULAR | Status: AC
  Filled 2021-11-12: qty 2

## 2021-11-12 MED ORDER — PHENYLEPHRINE HCL-NACL 20-0.9 MG/250ML-% IV SOLN
INTRAVENOUS | Status: DC | PRN
  Administered 2021-11-12: 50 ug/min via INTRAVENOUS

## 2021-11-12 MED ORDER — SODIUM CHLORIDE FLUSH 0.9 % IV SOLN
INTRAVENOUS | Status: DC | PRN
  Administered 2021-11-12: 96 mL

## 2021-11-12 MED ORDER — LIDOCAINE 2% (20 MG/ML) 5 ML SYRINGE
INTRAMUSCULAR | Status: DC | PRN
  Administered 2021-11-12: 80 mg via INTRAVENOUS

## 2021-11-12 MED ORDER — CHLORHEXIDINE GLUCONATE CLOTH 2 % EX PADS
6.0000 | MEDICATED_PAD | Freq: Every day | CUTANEOUS | Status: DC
  Administered 2021-11-13 – 2021-11-26 (×11): 6 via TOPICAL

## 2021-11-12 MED ORDER — ACETAMINOPHEN 500 MG PO TABS
1000.0000 mg | ORAL_TABLET | Freq: Four times a day (QID) | ORAL | Status: AC
  Administered 2021-11-12 – 2021-11-16 (×16): 1000 mg via ORAL
  Filled 2021-11-12 (×20): qty 2

## 2021-11-12 SURGICAL SUPPLY — 104 items
ADH SKN CLS APL DERMABOND .7 (GAUZE/BANDAGES/DRESSINGS) ×1
BAG TISS RTRVL C300 12X14 (MISCELLANEOUS) ×1
BLADE CLIPPER SURG (BLADE) IMPLANT
CANISTER SUCT 3000ML PPV (MISCELLANEOUS) ×2 IMPLANT
CANNULA REDUC XI 12-8 STAPL (CANNULA) ×2
CANNULA REDUCER 12-8 DVNC XI (CANNULA) ×2 IMPLANT
CLIP TI WIDE RED SMALL 6 (CLIP) IMPLANT
CNTNR URN SCR LID CUP LEK RST (MISCELLANEOUS) ×5 IMPLANT
CONN ST 1/4X3/8  BEN (MISCELLANEOUS) ×1
CONN ST 1/4X3/8 BEN (MISCELLANEOUS) IMPLANT
CONT SPEC 4OZ STRL OR WHT (MISCELLANEOUS) ×22
DEFOGGER SCOPE WARMER CLEARIFY (MISCELLANEOUS) ×1 IMPLANT
DERMABOND ADVANCED .7 DNX12 (GAUZE/BANDAGES/DRESSINGS) ×1 IMPLANT
DRAIN CHANNEL 28F RND 3/8 FF (WOUND CARE) IMPLANT
DRAIN CHANNEL 32F RND 10.7 FF (WOUND CARE) IMPLANT
DRAPE ARM DVNC X/XI (DISPOSABLE) ×4 IMPLANT
DRAPE COLUMN DVNC XI (DISPOSABLE) ×1 IMPLANT
DRAPE CV SPLIT W-CLR ANES SCRN (DRAPES) ×1 IMPLANT
DRAPE DA VINCI XI ARM (DISPOSABLE) ×4
DRAPE DA VINCI XI COLUMN (DISPOSABLE) ×1
DRAPE HALF SHEET 40X57 (DRAPES) ×1 IMPLANT
DRAPE INCISE IOBAN 66X45 STRL (DRAPES) IMPLANT
DRAPE ORTHO SPLIT 77X108 STRL (DRAPES) ×1
DRAPE SURG ORHT 6 SPLT 77X108 (DRAPES) ×1 IMPLANT
ELECT BLADE 6.5 EXT (BLADE) ×1 IMPLANT
ELECT REM PT RETURN 9FT ADLT (ELECTROSURGICAL) ×1
ELECTRODE REM PT RTRN 9FT ADLT (ELECTROSURGICAL) ×1 IMPLANT
GAUZE KITTNER 4X5 RF (MISCELLANEOUS) ×2 IMPLANT
GAUZE SPONGE 4X4 12PLY STRL (GAUZE/BANDAGES/DRESSINGS) ×1 IMPLANT
GLOVE SS BIOGEL STRL SZ 7.5 (GLOVE) ×1 IMPLANT
GLOVE SURG POLYISO LF SZ8 (GLOVE) ×1 IMPLANT
GOWN STRL REUS W/ TWL LRG LVL3 (GOWN DISPOSABLE) ×2 IMPLANT
GOWN STRL REUS W/ TWL XL LVL3 (GOWN DISPOSABLE) ×2 IMPLANT
GOWN STRL REUS W/TWL 2XL LVL3 (GOWN DISPOSABLE) ×1 IMPLANT
GOWN STRL REUS W/TWL LRG LVL3 (GOWN DISPOSABLE) ×2
GOWN STRL REUS W/TWL XL LVL3 (GOWN DISPOSABLE) ×2
HEMOSTAT SURGICEL 2X14 (HEMOSTASIS) ×3 IMPLANT
IRRIGATION STRYKERFLOW (MISCELLANEOUS) ×1 IMPLANT
IRRIGATOR STRYKERFLOW (MISCELLANEOUS) ×1
KIT BASIN OR (CUSTOM PROCEDURE TRAY) ×1 IMPLANT
KIT SUCTION CATH 14FR (SUCTIONS) IMPLANT
KIT TURNOVER KIT B (KITS) ×1 IMPLANT
NDL HYPO 25GX1X1/2 BEV (NEEDLE) ×1 IMPLANT
NDL SPNL 22GX3.5 QUINCKE BK (NEEDLE) ×1 IMPLANT
NEEDLE HYPO 25GX1X1/2 BEV (NEEDLE) ×1 IMPLANT
NEEDLE SPNL 22GX3.5 QUINCKE BK (NEEDLE) ×1 IMPLANT
NS IRRIG 1000ML POUR BTL (IV SOLUTION) ×2 IMPLANT
PACK CHEST (CUSTOM PROCEDURE TRAY) ×1 IMPLANT
PAD ARMBOARD 7.5X6 YLW CONV (MISCELLANEOUS) ×2 IMPLANT
RELOAD EGIA 45 MED/THCK PURPLE (STAPLE) IMPLANT
RELOAD STAPLE 45 2.5 WHT DVNC (STAPLE) IMPLANT
RELOAD STAPLE 45 3.5 BLU DVNC (STAPLE) IMPLANT
RELOAD STAPLE 45 4.3 GRN DVNC (STAPLE) IMPLANT
RELOAD STAPLE 45 4.6 BLK DVNC (STAPLE) IMPLANT
RELOAD STAPLER 2.5X45 WHT DVNC (STAPLE) ×1 IMPLANT
RELOAD STAPLER 3.5X45 BLU DVNC (STAPLE) ×7 IMPLANT
RELOAD STAPLER 4.3X45 GRN DVNC (STAPLE) ×3 IMPLANT
RELOAD STAPLER 45 4.6 BLK DVNC (STAPLE) ×1 IMPLANT
SCISSORS LAP 5X35 DISP (ENDOMECHANICALS) IMPLANT
SEAL CANN UNIV 5-8 DVNC XI (MISCELLANEOUS) ×2 IMPLANT
SEAL XI 5MM-8MM UNIVERSAL (MISCELLANEOUS) ×2
SEALER SYNCHRO 8 IS4000 DV (MISCELLANEOUS) ×1
SEALER SYNCHRO 8 IS4000 DVNC (MISCELLANEOUS) IMPLANT
SET TRI-LUMEN FLTR TB AIRSEAL (TUBING) ×1 IMPLANT
SHEARS HARMONIC HDI 20CM (ELECTROSURGICAL) IMPLANT
SOLUTION ELECTROLUBE (MISCELLANEOUS) ×1 IMPLANT
SPONGE INTESTINAL PEANUT (DISPOSABLE) IMPLANT
SPONGE TONSIL TAPE 1 RFD (DISPOSABLE) IMPLANT
STAPLER CANNULA SEAL DVNC XI (STAPLE) ×2 IMPLANT
STAPLER CANNULA SEAL XI (STAPLE) ×2
STAPLER ENDO GIA 12 SHRT THIN (STAPLE) IMPLANT
STAPLER ENDO GIA 12MM SHORT (STAPLE) ×1 IMPLANT
STAPLER RELOAD 2.5X45 WHITE (STAPLE) ×1
STAPLER RELOAD 2.5X45 WHT DVNC (STAPLE) ×1
STAPLER RELOAD 3.5X45 BLU DVNC (STAPLE) ×7
STAPLER RELOAD 3.5X45 BLUE (STAPLE) ×7
STAPLER RELOAD 4.3X45 GREEN (STAPLE) ×3
STAPLER RELOAD 4.3X45 GRN DVNC (STAPLE) ×3
STAPLER RELOAD 45 4.6 BLK (STAPLE) ×1
STAPLER RELOAD 45 4.6 BLK DVNC (STAPLE) ×1
SUT PROLENE 4 0 RB 1 (SUTURE)
SUT PROLENE 4-0 RB1 .5 CRCL 36 (SUTURE) IMPLANT
SUT SILK  1 MH (SUTURE) ×1
SUT SILK 1 MH (SUTURE) ×1 IMPLANT
SUT SILK 2 0 SH (SUTURE) ×1 IMPLANT
SUT SILK 2 0SH CR/8 30 (SUTURE) IMPLANT
SUT SILK 3 0SH CR/8 30 (SUTURE) IMPLANT
SUT VIC AB 1 CTX 36 (SUTURE) ×1
SUT VIC AB 1 CTX36XBRD ANBCTR (SUTURE) ×1 IMPLANT
SUT VIC AB 2-0 CT1 27 (SUTURE)
SUT VIC AB 2-0 CT1 TAPERPNT 27 (SUTURE) IMPLANT
SUT VIC AB 2-0 CTX 36 (SUTURE) ×1 IMPLANT
SUT VIC AB 3-0 X1 27 (SUTURE) ×2 IMPLANT
SUT VICRYL 0 TIES 12 18 (SUTURE) ×1 IMPLANT
SUT VICRYL 0 UR6 27IN ABS (SUTURE) ×2 IMPLANT
SUT VICRYL 2 TP 1 (SUTURE) IMPLANT
SYR 20CC LL (SYRINGE) ×2 IMPLANT
SYSTEM RETRIEVAL ANCHOR 12 (MISCELLANEOUS) IMPLANT
SYSTEM SAHARA CHEST DRAIN ATS (WOUND CARE) ×1 IMPLANT
TAPE CLOTH 4X10 WHT NS (GAUZE/BANDAGES/DRESSINGS) ×1 IMPLANT
TAPE CLOTH SURG 4X10 WHT LF (GAUZE/BANDAGES/DRESSINGS) IMPLANT
TOWEL GREEN STERILE (TOWEL DISPOSABLE) ×1 IMPLANT
TRAY FOLEY MTR SLVR 16FR STAT (SET/KITS/TRAYS/PACK) ×1 IMPLANT
WATER STERILE IRR 1000ML POUR (IV SOLUTION) ×2 IMPLANT

## 2021-11-12 NOTE — Hospital Course (Addendum)
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  He had a 60-pack-year history of smoking prior to quitting 2 months ago when he was diagnosed with lung cancer.   He was found to have a right upper lobe lung nodule on a low-dose CT for lung cancer screening.  Dr. Lamonte Sakai did a navigational bronchoscopy which showed adenocarcinoma.  PET/CT showed right hilar adenopathy.  There was no evidence of mediastinal adenopathy or distant metastases.  There was a question of a transverse colon lesion.  He had colonoscopy and that issue was resolved.   He was treated with neoadjuvant chemoimmunotherapy.  He received 3 cycles of carboplatin and pemetrexed.  He also received Opdivo with the third cycle.  After that he went into atrial fibrillation and was hospitalized.  He was cardioverted and discharged on Eliquis.   He tells the first 2 cycles of chemotherapy well.  The third cycle with the Opdivo and the atrial fibrillation resulted in him feeling very poorly.  He does feel better that he is converted back to sinus rhythm.  He is not having any chest pain, pressure, or tightness.  He does get short of breath with walking rapidly but not at a regular pace.  He has lost about 10 pounds since I saw him in June.  Hospital Course: Patient underwent an Xi robotic assisted right thoracoscopy, RUL, LN dissection, and intercostal nerve block. He was extubated and transferred from the OR to PACU in stable condition. Chest was placed to suction.  The patient's post operative CXR showed a large pneumothorax with air leak.  He was placed back to suction.  Follow up CXR showed in-complete re-expansion of the lung.  The patient has chronic anemia with admission Hgb of 8.5, this dropped to 7.3 and was monitored closely.   Her IV fluids and foley catheter were removed without difficulty. Daily chest x rays were obtained and remained stable. Chest tube was placed to water seal on 10/25. Patient was on Apixaban prior to surgery (a fib) and this was resumed on 10/27. Chest x ray showed increased right pneumothorax and increased subcutaneous emphysema bilateral neck and chest wall. Chest tube was placed back to suction. CXR on 10/28 showed improved right pneumothorax with significant re expansion of right lung and extensive subcutaneous emphysema.  Of note, chest tube was to water seal and CXR remained stable on 10/29. As discussed with surgeon, will leave chest tube to water seal and recheck CXR in am. Unfortunately, CXR am of 10/30 showed increased right pneumothorax and extensive subcutaneous emphysema. Again, chest tube was placed back to suction. Ultimately, he required a video bronchoscopy and placement of RML IBVs on 11/02.  On the day following placement of the endobronchial valves, the right lung was well-expanded and there was only a trace air leak.  We left the chest tube on waterseal at this point but by 24 hours later, he developed a significant right pneumothorax and had increasing subcu air.  We placed the chest tube back on suction resulting in right improvement of the pneumothorax but not complete resolution.  A small air leak persisted.  The chest tube was left on suction.

## 2021-11-12 NOTE — Discharge Summary (Cosign Needed)
[address]       [place] [address]             [phone]    Physician Discharge Summary  Patient ID: Darryl Ward MRN: [id number] DOB/AGE: 1945/03/01 76 y.o.  Admit date: 11/12/2021 Discharge date: 11/28/2021  Admission Diagnoses:Adenocarcinoma of the right upper lobe- Clinical stage IIB(T1,N1)  Patient Active Problem List   Diagnosis Date Noted   Neoplasm of upper lobe of right lung 11/12/2021   AKI (acute kidney injury) ([place]) 09/30/2021   Essential hypertension 09/30/2021   Persistent atrial fibrillation (HCC)    Generalized weakness    Acute combined systolic and diastolic heart failure (HCC)    Atrial fibrillation with RVR ([place]) 09/17/2021   History of colonic polyps 07/12/2021   Abnormal PET scan of colon 07/12/2021   Primary adenocarcinoma of upper lobe of right lung ([place]) 06/26/2021   Pulmonary nodule 1 cm or greater in diameter 05/29/2021   COPD (chronic obstructive pulmonary disease) ([place]) 05/29/2021   Lumbar radiculopathy 04/27/2020   Hospital discharge follow-up 02/15/2020   Lumbar spinal stenosis 02/06/2020   Cerebral thrombosis with cerebral infarction 02/05/2020   Meningioma, cerebral ([place]) 02/05/2020   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   Right hemiparesis ([place]) 02/04/2020   Chronic midline low back pain without sciatica 04/27/2019   Tobacco abuse 11/17/2018   PVD (peripheral vascular disease) ([place]) 05/21/2012   Atherosclerosis of native arteries of the extremities with ulceration ([place]) 01/23/2012   Mixed hyperlipidemia 12/13/2011   Coronary atherosclerosis of native coronary artery 12/13/2011   Aneurysm of abdominal vessel ([place]) 12/12/2011   Discharge Diagnoses: Adenocarcinoma of right upper lobe- Pathologic stage IA(ypT1b,ypN0) Patient Active Problem List   Diagnosis Date Noted   Neoplasm of upper lobe of right lung 11/12/2021   AKI (acute kidney injury) ([place]) 09/30/2021   Essential hypertension 09/30/2021   Persistent  atrial fibrillation (HCC)    Generalized weakness    Acute combined systolic and diastolic heart failure (HCC)    Atrial fibrillation with RVR ([place]) 09/17/2021   History of colonic polyps 07/12/2021   Abnormal PET scan of colon 07/12/2021   Primary adenocarcinoma of upper lobe of right lung ([place]) 06/26/2021   Pulmonary nodule 1 cm or greater in diameter 05/29/2021   COPD (chronic obstructive pulmonary disease) ([place]) 05/29/2021   Lumbar radiculopathy 04/27/2020   Hospital discharge follow-up 02/15/2020   Lumbar spinal stenosis 02/06/2020   Cerebral thrombosis with cerebral infarction 02/05/2020   Meningioma, cerebral ([place]) 02/05/2020   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   Right hemiparesis ([place]) 02/04/2020   Chronic midline low back pain without sciatica 04/27/2019   Tobacco abuse 11/17/2018   PVD (peripheral vascular disease) ([place]) 05/21/2012   Atherosclerosis of native arteries of the extremities with ulceration ([place]) 01/23/2012   Mixed hyperlipidemia 12/13/2011   Coronary atherosclerosis of native coronary artery 12/13/2011   Aneurysm of abdominal vessel ([place]) 12/12/2011   Discharged Condition: stable  HPI: This is a 76 year old man with a history of tobacco abuse, COPD, 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  He had a 74-pack-year history of smoking prior to quitting 2 months ago when he was diagnosed with lung cancer.   He was found to have a right upper lobe lung nodule on a low-dose CT for lung cancer screening.  Dr. Lamonte Sakai did a navigational bronchoscopy which showed adenocarcinoma.  PET/CT showed right hilar adenopathy.  There was no evidence of mediastinal adenopathy or distant metastases.  There was a question of a transverse colon lesion.  He had colonoscopy and that issue was resolved.   He was treated with neoadjuvant  chemoimmunotherapy.  He received 3 cycles of carboplatin and pemetrexed.  He also received Opdivo with the third cycle.  After that he went into atrial fibrillation and was hospitalized.  He was cardioverted and discharged on Eliquis.   He tells the first 2 cycles of chemotherapy well.  The third cycle with the Opdivo and the atrial fibrillation resulted in him feeling very poorly.  He does feel better that he is converted back to sinus rhythm.  He is not having any chest pain, pressure, or tightness.  He does get short of breath with walking rapidly but not at a regular pace.  He has lost about 10 pounds since I saw him in June.  Hospital Course: Patient underwent an Xi robotic assisted right thoracoscopy, RUL, LN dissection, and intercostal nerve block. He was extubated and transferred from the OR to PACU in stable condition. Chest was placed to suction.  The patient's post operative CXR showed a large pneumothorax with air leak.  He was placed back to suction.  Follow up CXR showed in-complete re-expansion of the lung.  The patient has chronic anemia with admission Hgb of 8.5, this dropped to 7.3 and was monitored closely.  Her IV fluids and foley catheter were removed without difficulty. Daily chest x rays were obtained and remained stable. Chest tube was placed to water seal on 10/25. Patient was on Apixaban prior to surgery (a fib) and this was resumed on 10/27. Chest x ray showed increased right pneumothorax and increased subcutaneous emphysema bilateral neck and chest wall. Chest tube was placed back to suction. CXR on 10/28 showed improved right pneumothorax with significant re expansion of right lung and extensive subcutaneous emphysema.  Of note, chest tube was to water seal and CXR remained stable on 10/29. As discussed with surgeon, will leave chest tube to water seal and recheck CXR in am. Unfortunately, CXR am of 10/30 showed increased right pneumothorax and extensive subcutaneous emphysema.  Again, chest tube was placed back to suction. Ultimately, he required a video bronchoscopy and placement of RML IBVs on 11/02.  On the day following placement of the endobronchial valves, the right lung was well-expanded and there was only a trace air leak.  We left the chest tube on waterseal at this point but by 24 hours later, he developed a significant right pneumothorax and had increasing subcu air.  We placed the chest tube back on suction resulting in right improvement of the pneumothorax but not complete resolution.  A small air leak persisted.  The chest tube was left on suction.  His chest xray appeared stable.  He was placed back on water seal on 11/26/2021.  Follow up CXR showed some increase in pneumothorax, with persistent air leak.  He was transitioned to a Mini Express Pleuro-vac.  Follow up CXR showed stable appearance of pneumothorax.  The patient's surgical incisions are healing without evidence of infection.  He is ambulating without difficulty.  He is medically stable for discharge home today.  Consults: None  Significant Diagnostic Studies: Narrative & Impression  CLINICAL DATA:  Pneumothorax.   EXAM: PORTABLE CHEST 1 VIEW   COMPARISON:  Chest radiograph 11/12/2021   FINDINGS: Left anterior chest wall Port-A-Cath tip projects over the superior vena cava. Stable  cardiac and mediastinal contours. Interval right upper lobectomy with moderate right pneumothorax. Right chest tube in place. Heterogeneous opacities throughout the right middle and right lower lobes. Similar pneumatocele left lower lung. No definite pleural effusion.   IMPRESSION: 1. Interval right upper lobectomy with moderate right pneumothorax. Right chest tube in place. 2. Heterogeneous opacities throughout the right middle and right lower lobes may represent atelectasis. 3. These results will be called to the ordering clinician or representative by the Radiologist Assistant, and communication documented  in the PACS or [employer].     Electronically Signed   By: Lovey Newcomer M.D.   On: 11/12/2021 12:54      Narrative & Impression  CLINICAL DATA:  Provided history: Chest tube present, lung surgery.   EXAM: PORTABLE CHEST 1 VIEW   COMPARISON:  Chest radiographs 11/15/2021 and   FINDINGS: Left chest infusion port catheter with tip projecting at the level of the superior cavoatrial junction. The cardiomediastinal silhouette is unchanged. Aortic atherosclerosis. Unchanged position of a right-sided chest tube with tip terminating at the level of the right lung apex. A moderate right pneumothorax appears slightly increased in size from the prior chest radiograph of 11/15/2021. Unchanged prominence of the interstitial lung markings. Continued interval increase in extensive subcutaneous emphysema, now present within the bilateral chest wall and neck.   Impresssion #1 will be called to the ordering clinician or representative by the Radiologist Assistant, and communication documented in the PACS or [employer].   IMPRESSION: A moderate right pneumothorax has slightly increased in size from yesterday's chest radiograph. Unchanged position of the right-sided chest tube.   Continued interval increase in extensive subcutaneous emphysema within the bilateral neck and chest wall.   Aortic Atherosclerosis (ICD10-I70.0).     Electronically Signed   By: Kellie Simmering D.O.   On: 11/16/2021 08:44      Treatments: surgery:  Xi robotic-assisted right upper lobectomy, lymph node dissection and intercostal nerve blocks levels 3 through 10 by Dr. Roxan Hockey on 11/12/2021.  Pathology:**  Discharge Exam: Blood pressure 96/60, pulse 71, temperature 98.3 F (36.8 C), temperature source Oral, resp. rate 14, height 5\' 10"  (1.778 m), weight 72.6 kg, SpO2 99 %. General appearance: alert, cooperative, and no distress Heart: regular rate and rhythm Lungs: diminished breath sounds on  right Abdomen: soft, non-tender; bowel sounds normal; no masses,  no organomegaly Extremities: extremities normal, atraumatic, no cyanosis or edema Wound: clean and dry  Discharge Medications:   Allergies as of 11/28/2021   No Known Allergies      Medication List     TAKE these medications    acetaminophen 500 MG tablet Commonly known as: TYLENOL Take 2 tablets (1,000 mg total) by mouth every 6 (six) hours as needed.   apixaban 5 MG Tabs tablet Commonly known as: ELIQUIS Take 1 tablet (5 mg total) by mouth 2 (two) times daily.   atorvastatin 80 MG tablet Commonly known as: LIPITOR Take 80 mg by mouth daily.   cyclobenzaprine 10 MG tablet Commonly known as: FLEXERIL Take 10 mg by mouth 2 (two) times daily as needed for muscle spasms.   folic acid 1 MG tablet Commonly known as: FOLVITE Take 1 tablet (1 mg total) by mouth daily.   furosemide 20 MG tablet Commonly known as: LASIX Take 1 tablet (20 mg total) by mouth daily as needed.   metoprolol succinate 50 MG 24 hr tablet Commonly known as: TOPROL-XL Take 1 tablet (50 mg total) by mouth daily.  traMADol 50 MG tablet Commonly known as: ULTRAM Take 1 tablet (50 mg total) by mouth every 6 (six) hours as needed (mild pain).        Follow-up Information     [name], MD. Go on 12/04/2021.   Specialty: Cardiothoracic Surgery Why: Appointment time is at 11:45 am Contact information: [name] [address] [phone]         [facility]. Go on 12/04/2021.   Why: PA/LAT CXR to be taken. Please arrive by 10:00 Contact information: [place]                Signed:  Ellwood Handler, PA-C 11/28/2021, 9:12 AM

## 2021-11-12 NOTE — Anesthesia Procedure Notes (Signed)
Procedure Name: Intubation Date/Time: 11/12/2021 7:58 AM  Performed by: Lance Coon, CRNAPre-anesthesia Checklist: Patient identified, Emergency Drugs available, Suction available, Patient being monitored and Timeout performed Patient Re-evaluated:Patient Re-evaluated prior to induction Oxygen Delivery Method: Circle system utilized Preoxygenation: Pre-oxygenation with 100% oxygen Induction Type: IV induction Ventilation: Mask ventilation without difficulty Laryngoscope Size: Miller and 3 Grade View: Grade I Endobronchial tube: Left, Double lumen EBT, EBT position confirmed by fiberoptic bronchoscope and EBT position confirmed by auscultation and 39 Fr Number of attempts: 1 Airway Equipment and Method: Stylet and Fiberoptic brochoscope Placement Confirmation: ETT inserted through vocal cords under direct vision, positive ETCO2 and breath sounds checked- equal and bilateral Tube secured with: Tape Dental Injury: Teeth and Oropharynx as per pre-operative assessment

## 2021-11-12 NOTE — Discharge Instructions (Addendum)
Change Chest tube Dressing daily You may sponge bath do not shower No Driving  If you develop sudden onset chest pain or shortness of breath report to your local ED if after hours.[name] if during normal business hours can contact our office.  Call our office at [phone] if issues arise   Robot-Assisted Thoracic Surgery, Care After The following information offers guidance on how to care for yourself after your procedure. Your health care provider may also give you more specific instructions. If you have problems or questions, contact your health care provider. What can I expect after the procedure? After the procedure, it is common to have: Some pain and aches in the area of your surgical incisions. Pain when breathing in (inhaling) and coughing. Tiredness (fatigue). Trouble sleeping. Constipation. Follow these instructions at home: Medicines Take over-the-counter and prescription medicines only as told by your health care provider. If you were prescribed an antibiotic medicine, take it as told by your health care provider. Do not stop taking the antibiotic even if you start to feel better. Talk with your health care provider about safe and effective ways to manage pain after your procedure. Pain management should fit your specific health needs. Take pain medicine before pain becomes severe. Relieving and controlling your pain will make breathing easier for you. Ask your health care provider if the medicine prescribed to you requires you to avoid driving or using machinery. Eating and drinking Follow instructions from your health care provider about eating or drinking restrictions. These will vary depending on what procedure you had. Your health care provider may recommend: A liquid diet or soft diet for the first few days. Meals that are smaller and more frequent. A diet of fruits, vegetables, whole grains, and low-fat proteins. Limiting foods that are high in fat and processed sugar,  including fried or sweet foods. Incision care Follow instructions from your health care provider about how to take care of your incisions. Make sure you: Wash your hands with soap and water for at least 20 seconds before and after you change your bandage (dressing). If soap and water are not available, use hand sanitizer. Change your dressing as told by your health care provider. Leave stitches (sutures), skin glue, or adhesive strips in place. These skin closures may need to stay in place for 2 weeks or longer. If adhesive strip edges start to loosen and curl up, you may trim the loose edges. Do not remove adhesive strips completely unless your health care provider tells you to do that. Check your incision area every day for signs of infection. Check for: Redness, swelling, or more pain. Fluid or blood. Warmth. Pus or a bad smell. Activity Return to your normal activities as told by your health care provider. Ask your health care provider what activities are safe for you. Ask your health care provider when it is safe for you to drive. Do not lift anything that is heavier than 10 lb (4.5 kg), or the limit that you are told, until your health care provider says that it is safe. Rest as told by your health care provider. Avoid sitting for a long time without moving. Get up to take short walks every 1-2 hours. This is important to improve blood flow and breathing. Ask for help if you feel weak or unsteady. Do exercises as told by your health care provider. Pneumonia prevention  Do deep breathing exercises and cough regularly as directed. This helps clear mucus and opens your lungs. Doing this helps  prevent lung infection (pneumonia). If you were given an incentive spirometer, use it as told. An incentive spirometer is a tool that measures how well you are filling your lungs with each breath. Coughing may hurt less if you try to support your chest. This is called splinting. Try one of these when  you cough: Hold a pillow against your chest. Place the palms of both hands on top of your incision area. Do not use any products that contain nicotine or tobacco. These products include cigarettes, chewing tobacco, and vaping devices, such as e-cigarettes. If you need help quitting, ask your health care provider. Avoid secondhand smoke. General instructions If you have a drainage tube: Follow instructions from your health care provider about how to take care of it. Do not travel by airplane after your tube is removed until your health care provider tells you it is safe. You may need to take these actions to prevent or treat constipation: Drink enough fluid to keep your urine pale yellow. Take over-the-counter or prescription medicines. Eat foods that are high in fiber, such as beans, whole grains, and fresh fruits and vegetables. Limit foods that are high in fat and processed sugars, such as fried or sweet foods. Keep all follow-up visits. This is important. Contact a health care provider if: You have redness, swelling, or more pain around an incision. You have fluid or blood coming from an incision. An incision feels warm to the touch. You have pus or a bad smell coming from an incision. You have a fever. You cannot eat or drink without vomiting. Your pain medicine is not controlling your pain. Get help right away if: You have chest pain. Your heart is beating quickly. You have trouble breathing. You have trouble speaking. You are confused. You feel weak or dizzy, or you faint. These symptoms may represent a serious problem that is an emergency. Do not wait to see if the symptoms will go away. Get medical help right away. Call your local emergency services (911 in the [place] Do not drive yourself to the hospital. Summary Talk with your health care provider about safe and effective ways to manage pain after your procedure. Pain management should fit your specific health needs. Return  to your normal activities as told by your health care provider. Ask your health care provider what activities are safe for you. Do deep breathing exercises and cough regularly as directed. This helps to clear mucus and prevent pneumonia. If it hurts to cough, ease pain by holding a pillow against your chest or by placing the palms of both hands over your incisions. This information is not intended to replace advice given to you by your health care provider. Make sure you discuss any questions you have with your health care provider. Document Revised: 10/01/2019 Document Reviewed: 10/01/2019 Elsevier Patient Education  [place].

## 2021-11-12 NOTE — Anesthesia Procedure Notes (Signed)
Arterial Line Insertion Start/End10/23/2023 7:05 AM, 11/12/2021 7:15 AM Performed by: Lance Coon, CRNA, CRNA  Patient location: Pre-op. Preanesthetic checklist: patient identified, IV checked, site marked, risks and benefits discussed, surgical consent, monitors and equipment checked, pre-op evaluation, timeout performed and anesthesia consent Lidocaine 1% used for infiltration Right, radial was placed Catheter size: 20 G Hand hygiene performed  and maximum sterile barriers used   Attempts: 2 Procedure performed without using ultrasound guided technique. Following insertion, dressing applied and Biopatch. Post procedure assessment: normal and unchanged  Patient tolerated the procedure well with no immediate complications.

## 2021-11-12 NOTE — Transfer of Care (Signed)
Immediate Anesthesia Transfer of Care Note  Patient: Darryl Ward  Procedure(s) Performed: XI ROBOTIC ASSISTED THORACOSCOPY-RIGHT UPPER LOBECTOMY (Right: Chest) LYMPH NODE DISSECTION (Right: Chest) INTERCOSTAL NERVE BLOCK (Right: Chest)  Patient Location: PACU  Anesthesia Type:General  Level of Consciousness: drowsy and patient cooperative  Airway & Oxygen Therapy: Patient Spontanous Breathing  Post-op Assessment: Report given to RN and Post -op Vital signs reviewed and stable  Post vital signs: Reviewed and stable  Last Vitals:  Vitals Value Taken Time  BP 151/86 11/12/21 1204  Temp    Pulse 78 11/12/21 1207  Resp 17 11/12/21 1207  SpO2 100 % 11/12/21 1207  Vitals shown include unvalidated device data.  Last Pain:  Vitals:   11/12/21 0655  TempSrc:   PainSc: 0-No pain         Complications: No notable events documented.

## 2021-11-12 NOTE — Progress Notes (Signed)
RN notified from PACU RN that patient's chest xr resulted and patient has a R pneumothorax.

## 2021-11-12 NOTE — Interval H&P Note (Signed)
History and Physical Interval Note:  11/12/2021 7:20 AM  Darryl Ward  has presented today for surgery, with the diagnosis of RUL ADENOCARCINOMA.  The various methods of treatment have been discussed with the patient and family. After consideration of risks, benefits and other options for treatment, the patient has consented to  Procedure(s): XI ROBOTIC ASSISTED THORACOSCOPY-RIGHT UPPER LOBECTOMY (Right) as a surgical intervention.  The patient's history has been reviewed, patient examined, no change in status, stable for surgery.  I have reviewed the patient's chart and labs.  Questions were answered to the patient's satisfaction.     [name]

## 2021-11-12 NOTE — Op Note (Signed)
NAME: Darryl Ward RECORD NO: [id number] ACCOUNT NO: [id number] DATE OF BIRTH: 1945/08/27 FACILITY: [facility] LOCATION: [facility] PHYSICIAN: Revonda Standard. Roxan Hockey, MD  Operative Report   DATE OF PROCEDURE: 11/12/2021  PREOPERATIVE DIAGNOSIS:  Adenocarcinoma, right upper lobe, clinical stage IIB, status post chemoimmunotherapy.  POSTOPERATIVE DIAGNOSIS:  Adenocarcinoma, right upper lobe, clinical stage IIB, status post chemoimmunotherapy.  PROCEDURE:  Xi robotic-assisted right upper lobectomy, lymph node dissection and intercostal nerve blocks levels 3 through 10.  SURGEON:  Revonda Standard. Roxan Hockey, MD  ASSISTANT:  Lars Pinks, PA  ANESTHESIA:  General.  FINDINGS:  Markedly enlarged level 11 node, frozen section negative for tumor. Large level 10 and 12 nodes around right upper lobe bronchus and arteries.  Bronchial margin free of tumor.  CLINICAL NOTE:  [name] is a 76 year old gentleman with a history of tobacco abuse, who earlier this year was diagnosed with stage IIB adenocarcinoma of the right upper lobe.  He had neoadjuvant chemoimmunotherapy with a good response.  He now  presents for surgical resection.  The indications, risks, benefits, and alternatives were discussed in detail with the patient.  He understood and accepted the risks and agreed to proceed.  OPERATIVE NOTE:  Mr. Bondar was brought to the preoperative holding area on 11/12/2021.  Anesthesia placed an arterial blood pressure monitoring line and established intravenous access.  He was taken to the operating room and anesthetized and intubated  with a double lumen endotracheal tube.  Intravenous antibiotics were administered.  Sequential compression devices were placed on the calves for DVT prophylaxis.  A Foley catheter was placed.  He was placed in a left lateral decubitus position.  A Bair hugger was placed for active warming.  The  right chest was prepped and draped in the usual sterile fashion.  Single  lung ventilation of the left lung was initiated and was tolerated well throughout the procedure.  A timeout was performed.  A solution containing 20 mL of liposomal bupivacaine, 30 mL of 0.5% bupivacaine and 50 mL of saline was prepared.  This solution was used for local at the incision sites as well as for the intercostal nerve blocks.  An incision was made  in the eighth interspace in approximately the midaxillary line, and an 8 mm port was inserted.  The thoracoscope was advanced into the chest.  After confirming intrapleural placement, carbon dioxide was insufflated per protocol.  A 12 mm port was placed in the  eighth interspace anterior to the camera port.  Intercostal nerve blocks then were performed from the third to the tenth interspace injecting 10 mL of the bupivacaine solution into a subpleural plane at each level. A 12 mm AirSeal port was placed in the  tenth interspace posterolaterally.  Two additional eighth interspace robotic ports were placed.  The robot was deployed.  The camera arm was docked.  Targeting was performed.  The remaining arms were docked.  Robotic instruments were inserted with  thoracoscopic visualization.  There was good isolation of the right lung. The inferior ligament was divided.  All lymph nodes that were encountered were sent for permanent pathology with exception of the one level 11 node. Small level 9 nodes were removed and sent for pathology.  Dividing  the pleural reflection at the hilum posteriorly, a larger, but otherwise benign-appearing level 8 node was removed.  Working further superiorly, multiple level 7 nodes were removed.  There was some bleeding with removal of the level 7 nodes and Surgicel was packed into the lymph node  bed.  The space between the bifurcation of the upper lobe from the bronchus intermedius was explored and a level 11 node was removed.  Working superiorly, the pleural reflection was foreshortened between the superior hilum and  the  azygos vein.  This space was opened.  There was a small level 10 nodes in that area that was removed.  The pleura then was incised superior to the azygos vein over the mediastinum and level 4R nodes were removed.  There was some bleeding with  4R node removal and Surgicel was packed into that area as well.  Pleural reflection was divided at the hilum anteriorly.  The fissure was inspected. Fissure was incomplete between the upper and lower lobes.  There was an area where the pulmonary artery  was visible in the minor fissure between the middle lobe and the lower lobe.  The pleura was incised in that area.  A plane was developed superficial to the basilar segmental pulmonary artery.  The superior segmental artery was identified and then the  fissure was completed, working from anterior to posterior with sequential firings of the robotic stapler.  Additional nodes were removed and also while working on the fissure, there was a level 11 node between the middle and lower lobes that was markedly  enlarged at about 2 cm and was abnormal-appearing, but not grossly cancerous.  The node was sent for frozen section, but that returned with no tumor seen.  The minor fissure then was completed.  The initial staple firing was from anterior to posterior and  then the remaining firings of the stapler were done from posterior to anterior.  A small posterior ascending branch was identified and that was divided using the SynchroSeal device.  A large node then was dissected out between the remaining PA branches  and the bronchus.  The superior pulmonary vein branches from the upper lobe were encircled and divided with the robotic stapler.  The anterior apical trunk then was divided and finally the stapler was placed across the upper lobe bronchus flush with its  takeoff and closed.  A test inflation showed good aeration of the lower and middle lobes.  Stapler was fired transecting the upper lobe bronchus.  The chest was  copiously irrigated with warm saline.  A test inflation to 30 cm water revealed no air leakage.  A 12  mm endoscopic retrieval bag was placed through the AirSeal port.  The upper lobe was manipulated into the bag.  The robotic instruments then were removed.  The robot was undocked.  The anterior eighth interspace incision was lengthened to 3 cm and the  upper lobe was removed through that incision and sent for frozen section of bronchial margin which subsequently returned with no tumor seen.  Chest was copiously irrigated with saline.  Inspection was made for hemostasis.  A 28-French Blake drain was  placed through the original incision and secured with #1 silk suture.  The middle lobe was tacked to the lower lobe using a Covidien stapler with a purple cartridge.  Dual lung ventilation was resumed.  The remaining incisions were closed in standard  fashion.  Chest tube was placed to a Pleur-Evac on waterseal.  He was placed back in a supine position.  He was extubated in the operating room and taken to the postanesthetic care unit in good condition.  All sponge, needle and instrument counts were  correct at the end of the procedure.  Experienced assistance was necessary for this case due to  surgical complexity.  Lars Pinks served as the assistant providing assistance with port placement, suctioning, specimen retrieval, instrument exchange and wound closure.   NIK D: 11/12/2021 6:39:30 pm T: 11/12/2021 11:57:00 pm  JOB: [id number]/ [id number]

## 2021-11-12 NOTE — Brief Op Note (Signed)
11/12/2021  11:47 AM  PATIENT:  Darryl Ward  76 y.o. male  PRE-OPERATIVE DIAGNOSIS:  Right Upper Lobe Adenocarcinoma  POST-OPERATIVE DIAGNOSIS:  Right Upper Lobe Adenocarcinoma  PROCEDURE:XI ROBOTIC ASSISTED RIGHT THORACOSCOPY, RIGHT UPPER LOBECTOMY, LYMPH NODE DISSECTION  INTERCOSTAL NERVE BLOCK (ribs 3 through 10)  SURGEON:  Surgeon(s) and Role:    [name], MD - Primary  PHYSICIAN ASSISTANT: Lars Pinks PA-C  ANESTHESIA:   general  EBL:  50 mL   BLOOD ADMINISTERED:none  DRAINS:  28 Blake drain placed in the right pleural space    LOCAL MEDICATIONS USED:  OTHER Exparel  SPECIMEN:  Source of Specimen:  Multiple lymph nodes and RUL  DISPOSITION OF SPECIMEN:   Pathology. Frozen of one lymph node negative for cancer. Bronchial margin negative for cancer  COUNTS CORRECT:  YES  DICTATION: .Dragon Dictation  PLAN OF CARE: Admit to inpatient   PATIENT DISPOSITION:  PACU - hemodynamically stable.   Delay start of Pharmacological VTE agent (>24hrs) due to surgical blood loss or risk of bleeding: no

## 2021-11-13 ENCOUNTER — Encounter (HOSPITAL_COMMUNITY): Payer: Self-pay | Admitting: Thoracic Surgery (Cardiothoracic Vascular Surgery)

## 2021-11-13 ENCOUNTER — Inpatient Hospital Stay (HOSPITAL_COMMUNITY): Payer: Medicare Other

## 2021-11-13 LAB — CBC
HCT: 22.6 % — ABNORMAL LOW (ref 39.0–52.0)
Hemoglobin: 7.3 g/dL — ABNORMAL LOW (ref 13.0–17.0)
MCH: 34.9 pg — ABNORMAL HIGH (ref 26.0–34.0)
MCHC: 32.3 g/dL (ref 30.0–36.0)
MCV: 108.1 fL — ABNORMAL HIGH (ref 80.0–100.0)
Platelets: 119 10*3/uL — ABNORMAL LOW (ref 150–400)
RBC: 2.09 MIL/uL — ABNORMAL LOW (ref 4.22–5.81)
RDW: 19.2 % — ABNORMAL HIGH (ref 11.5–15.5)
WBC: 8.8 10*3/uL (ref 4.0–10.5)
nRBC: 0 % (ref 0.0–0.2)

## 2021-11-13 LAB — BASIC METABOLIC PANEL
Anion gap: 5 (ref 5–15)
BUN: 14 mg/dL (ref 8–23)
CO2: 23 mmol/L (ref 22–32)
Calcium: 8.3 mg/dL — ABNORMAL LOW (ref 8.9–10.3)
Chloride: 108 mmol/L (ref 98–111)
Creatinine, Ser: 1.08 mg/dL (ref 0.61–1.24)
GFR, Estimated: >60 mL/min (ref >60)
Glucose, Bld: 157 mg/dL — ABNORMAL HIGH (ref 70–99)
Potassium: 5 mmol/L (ref 3.5–5.1)
Sodium: 136 mmol/L (ref 135–145)

## 2021-11-13 NOTE — Progress Notes (Signed)
Mobility Specialist Progress Note    11/13/21 1138  Mobility  Activity Ambulated with assistance in hallway  Level of Assistance Minimal assist, patient does 75% or more  Assistive Device Front wheel walker  Distance Ambulated (ft) 50 ft  Activity Response Tolerated well  Mobility Referral Yes  $Mobility charge 1 Mobility   Pre-Mobility: 56 HR, 103/48 (65) BP, 100% SpO2 During Mobility: 80 HR, 97% SpO2 Post-Mobility: 68 HR, 107/34 (56) BP, 99% SpO2  Pt received in bed and agreeable. No complaints on walk. Tolerated on RA. Returned to chair with call bell in reach. RN aware.   Hildred Alamin Mobility Specialist  Secure Chat Only

## 2021-11-13 NOTE — Progress Notes (Signed)
Mobility Specialist Progress Note    11/13/21 1528  Mobility  Activity Ambulated with assistance in room  Level of Assistance Minimal assist, patient does 75% or more  Assistive Device Front wheel walker  Distance Ambulated (ft) 12 ft  Activity Response Tolerated well  Mobility Referral Yes  $Mobility charge 1 Mobility   Post-Mobility: 69 HR, 100% SpO2  Pt received in chair. Declined further ambulation but requested to get back to bed. No complaints. Left with call bell in reach.  RN aware.   Hildred Alamin Mobility Specialist  Secure Chat Only

## 2021-11-13 NOTE — Anesthesia Postprocedure Evaluation (Signed)
Anesthesia Post Note  Patient: Darryl Ward  Procedure(s) Performed: XI ROBOTIC ASSISTED THORACOSCOPY-RIGHT UPPER LOBECTOMY (Right: Chest) LYMPH NODE DISSECTION (Right: Chest) INTERCOSTAL NERVE BLOCK (Right: Chest)     Patient location during evaluation: PACU Anesthesia Type: General Level of consciousness: awake and alert Pain management: pain level controlled Vital Signs Assessment: post-procedure vital signs reviewed and stable Respiratory status: spontaneous breathing, nonlabored ventilation, respiratory function stable and patient connected to nasal cannula oxygen Cardiovascular status: blood pressure returned to baseline and stable Postop Assessment: no apparent nausea or vomiting Anesthetic complications: no   No notable events documented.  Last Vitals:  Vitals:   11/13/21 0900 11/13/21 1118  BP: (!) 107/56 (!) 114/57  Pulse: (!) 59 70  Resp: 11 16  Temp:  36.7 C  SpO2: 100% 100%    Last Pain:  Vitals:   11/13/21 1118  TempSrc: Oral  PainSc: [place]

## 2021-11-13 NOTE — Progress Notes (Addendum)
      [address]       [place] [address]             [phone]      1 Day Post-Op Procedure(s) (LRB): XI ROBOTIC ASSISTED THORACOSCOPY-RIGHT UPPER LOBECTOMY (Right) LYMPH NODE DISSECTION (Right) INTERCOSTAL NERVE BLOCK (Right)  Subjective:  Sitting up in bed eating breakfast.  He states pain is well controlled.   Denies shortness of breath, nausea, vomiting.  Objective: Vital signs in last 24 hours: Temp:  [97 F (36.1 C)-97.7 F (36.5 C)] 97.6 F (36.4 C) (10/23 2309) Pulse Rate:  [54-81] 61 (10/24 0300) Cardiac Rhythm: Normal sinus rhythm (10/23 1914) Resp:  [10-19] 11 (10/24 0500) BP: (102-158)/(51-86) 103/56 (10/24 0500) SpO2:  [94 %-100 %] 100 % (10/24 0500) Arterial Line BP: (145-153)/(77-80) 153/80 (10/23 1220)  Intake/Output from previous day: 10/23 0701 - 10/24 0700 In: 3409.1 [P.O.:480; I.V.:2729.1; IV Piggyback:200] Out: 1400 [Urine:950; Blood:50; Chest Tube:400]  General appearance: alert, cooperative, and no distress Heart: regular rate and rhythm Lungs: clear to auscultation bilaterally Abdomen: soft, non-tender; bowel sounds normal; no masses,  no organomegaly Extremities: extremities normal, atraumatic, no cyanosis or edema Wound: clean and dry  Lab Results: Recent Labs    11/13/21 0050  WBC 8.8  HGB 7.3*  HCT 22.6*  PLT 119*   BMET:  Recent Labs    11/13/21 0050  NA 136  K 5.0  CL 108  CO2 23  GLUCOSE 157*  BUN 14  CREATININE 1.08  CALCIUM 8.3*    PT/INR: No results for input(s): "LABPROT", "INR" in the last 72 hours. ABG    Component Value Date/Time   PHART 7.44 11/08/2021 1337   HCO3 22.4 11/08/2021 1337   TCO2 26 01/08/2012 1828   ACIDBASEDEF 1.1 11/08/2021 1337   O2SAT 99.7 11/08/2021 1337   CBG (last 3)  No results for input(s): "GLUCAP" in the last 72 hours.  Assessment/Plan: S/P Procedure(s) (LRB): XI ROBOTIC ASSISTED THORACOSCOPY-RIGHT UPPER LOBECTOMY (Right) LYMPH NODE DISSECTION  (Right) INTERCOSTAL NERVE BLOCK (Right)  CV- H/O Atrial Fibrillation in SB, SBP 100-150s- home Toprol XL is ordered.. ? Resumption of Eliquis tomorrow Pulm- large air leak on suction, CT 440 cc output since surgery,  CXR with incomplete re-expansion of lung, + sub q emphysema.[name] leave on suction today, I have redressed chest tube site Renal- creatinine remains stable, okay to continue Toradol, adequate oral intake will stop IV Fluids Blood loss on chronic anemia- admission H/H was 8.5/26.4 down to 7.3/22.6-- monitor D/C Foley Lovenox for DVT prophylaxis   LOS: 1 day    Ellwood Handler, PA-C 11/13/2021 Overall looks good. Has a relatively large air leak, surprising as no indication of that in OR, monitor SCD + enoxaparin Anemia- acute blood loss from surgery in setting of chronic anemia Ambulate  Remo Lipps C. Roxan Hockey, MD [facility] [phone]

## 2021-11-13 NOTE — TOC Progression Note (Signed)
Transition of Care [facility]) - Progression Note    Patient Details  Name: NICHALOS BRENTON MRN: [id number] Date of Birth: [birth year]  Transition of Care [facility]) CM/SW Contact  Zenon Mayo, RN Phone Number: 11/13/2021, 3:50 PM  Clinical Narrative:    From home, POD 1 Lobectomy, has an unexpected air leak, chest tube in place.  TOC following.        Expected Discharge Plan and Services                                                 Social Determinants of Health (SDOH) Interventions    Readmission Risk Interventions     No data to display

## 2021-11-14 ENCOUNTER — Inpatient Hospital Stay (HOSPITAL_COMMUNITY): Payer: Medicare Other

## 2021-11-14 LAB — COMPREHENSIVE METABOLIC PANEL
ALT: 9 U/L (ref 0–44)
AST: 19 U/L (ref 15–41)
Albumin: 2 g/dL — ABNORMAL LOW (ref 3.5–5.0)
Alkaline Phosphatase: 43 U/L (ref 38–126)
Anion gap: 5 (ref 5–15)
BUN: 17 mg/dL (ref 8–23)
CO2: 22 mmol/L (ref 22–32)
Calcium: 8.2 mg/dL — ABNORMAL LOW (ref 8.9–10.3)
Chloride: 111 mmol/L (ref 98–111)
Creatinine, Ser: 1.06 mg/dL (ref 0.61–1.24)
GFR, Estimated: >60 mL/min (ref >60)
Glucose, Bld: 101 mg/dL — ABNORMAL HIGH (ref 70–99)
Potassium: 4.6 mmol/L (ref 3.5–5.1)
Sodium: 138 mmol/L (ref 135–145)
Total Bilirubin: 0.5 mg/dL (ref 0.3–1.2)
Total Protein: 4.7 g/dL — ABNORMAL LOW (ref 6.5–8.1)

## 2021-11-14 LAB — CBC
HCT: 21.4 % — ABNORMAL LOW (ref 39.0–52.0)
Hemoglobin: 6.8 g/dL — CL (ref 13.0–17.0)
MCH: 34 pg (ref 26.0–34.0)
MCHC: 31.8 g/dL (ref 30.0–36.0)
MCV: 107 fL — ABNORMAL HIGH (ref 80.0–100.0)
Platelets: 111 10*3/uL — ABNORMAL LOW (ref 150–400)
RBC: 2 MIL/uL — ABNORMAL LOW (ref 4.22–5.81)
RDW: 19.7 % — ABNORMAL HIGH (ref 11.5–15.5)
WBC: 9.4 10*3/uL (ref 4.0–10.5)
nRBC: 0 % (ref 0.0–0.2)

## 2021-11-14 LAB — PREPARE RBC (CROSSMATCH)

## 2021-11-14 LAB — HEMOGLOBIN AND HEMATOCRIT, BLOOD
HCT: 24 % — ABNORMAL LOW (ref 39.0–52.0)
Hemoglobin: 8.1 g/dL — ABNORMAL LOW (ref 13.0–17.0)

## 2021-11-14 MED ORDER — ORAL CARE MOUTH RINSE: 15.0000 mL | OROMUCOSAL | Status: DC | PRN

## 2021-11-14 NOTE — Care Management Important Message (Signed)
Important Message  Patient Details  Name: Darryl Ward MRN: [id number] Date of Birth: [birth year]   Medicare Important Message Given:  Yes     Orbie Pyo 11/14/2021, 3:17 PM

## 2021-11-14 NOTE — Progress Notes (Signed)
Mobility Specialist Progress Note    11/14/21 1133  Mobility  Activity Ambulated with assistance in hallway  Level of Assistance Minimal assist, patient does 75% or more  Assistive Device Front wheel walker  Distance Ambulated (ft) 140 ft  Activity Response Tolerated well  Mobility Referral Yes  $Mobility charge 1 Mobility   Pre-Mobility: 59 HR, 112/64 (78) BP, 95% SpO2 During Mobility: 83 HR, 95% SpO2 Post-Mobility: 69 HR, 126/96 (108) BP, 100% SpO2  Pt received in bed and agreeable. C/o some soreness. Had x1 small LOB requiring minA to recover. Returned to chair with call bell in reach.    Hildred Alamin Mobility Specialist  Secure Chat Only

## 2021-11-14 NOTE — Plan of Care (Signed)
  Problem: Activity: Goal: Risk for activity intolerance will decrease Outcome: Progressing   Problem: Respiratory: Goal: Respiratory status will improve Outcome: Progressing   Problem: Pain Management: Goal: Pain level will decrease Outcome: Progressing   Problem: Clinical Measurements: Goal: Cardiovascular complication will be avoided Outcome: Progressing   Problem: Activity: Goal: Risk for activity intolerance will decrease Outcome: Progressing   Problem: Nutrition: Goal: Adequate nutrition will be maintained Outcome: Progressing

## 2021-11-14 NOTE — Progress Notes (Signed)
2 Days Post-Op Procedure(s) (LRB): XI ROBOTIC ASSISTED THORACOSCOPY-RIGHT UPPER LOBECTOMY (Right) LYMPH NODE DISSECTION (Right) INTERCOSTAL NERVE BLOCK (Right) Subjective: Some soreness, no weakness or dizziness Denies nausea  Objective: Vital signs in last 24 hours: Temp:  [97.8 F (36.6 C)-98 F (36.7 C)] 98 F (36.7 C) (10/25 0630) Pulse Rate:  [55-70] 61 (10/25 0732) Cardiac Rhythm: Normal sinus rhythm;Bundle branch block (10/24 1911) Resp:  [11-20] 18 (10/25 0630) BP: (98-134)/(39-69) 117/69 (10/25 0732) SpO2:  [100 %] 100 % (10/25 0732)  Hemodynamic parameters for last 24 hours:    Intake/Output from previous day: 10/24 0701 - 10/25 0700 In: 1210 [P.O.:840; Blood:370] Out: 1270 [Urine:820; Chest Tube:450] Intake/Output this shift: No intake/output data recorded.  General appearance: alert, cooperative, and no distress Neurologic: intact Heart: regular rate and rhythm Lungs: diminished breath sounds right base o/w clear Wound: clean and dry + air leak  Lab Results: Recent Labs    11/13/21 0050 11/14/21 0116  WBC 8.8 9.4  HGB 7.3* 6.8*  HCT 22.6* 21.4*  PLT 119* 111*   BMET:  Recent Labs    11/13/21 0050 11/14/21 0116  NA 136 138  K 5.0 4.6  CL 108 111  CO2 23 22  GLUCOSE 157* 101*  BUN 14 17  CREATININE 1.08 1.06  CALCIUM 8.3* 8.2*    PT/INR: No results for input(s): "LABPROT", "INR" in the last 72 hours. ABG    Component Value Date/Time   PHART 7.44 11/08/2021 1337   HCO3 22.4 11/08/2021 1337   TCO2 26 01/08/2012 1828   ACIDBASEDEF 1.1 11/08/2021 1337   O2SAT 99.7 11/08/2021 1337   CBG (last 3)  No results for input(s): "GLUCAP" in the last 72 hours.  Assessment/Plan: S/P Procedure(s) (LRB): XI ROBOTIC ASSISTED THORACOSCOPY-RIGHT UPPER LOBECTOMY (Right) LYMPH NODE DISSECTION (Right) INTERCOSTAL NERVE BLOCK (Right) POD # 2 Overall looks good Hgb 6.8, was already anemic preop- PRBC ordered + air leak but decreased from yesterday-  will try CT to water seal Continue ambulation SCD + enoxaparin for DVT prophylaxis Wait another day or two before resuming apixaban   LOS: 2 days    [name] 11/14/2021

## 2021-11-14 NOTE — Progress Notes (Signed)
HgB was 6.8 today. DR Prescott Gum was notified. I unit PRBC was ordered. Carried out.Plan of care ongoing.

## 2021-11-15 ENCOUNTER — Inpatient Hospital Stay (HOSPITAL_COMMUNITY): Payer: Medicare Other

## 2021-11-15 LAB — TYPE AND SCREEN
ABO/RH(D): O NEG
Antibody Screen: NEGATIVE
Unit division: 0

## 2021-11-15 LAB — BPAM RBC
Blood Product Expiration Date: 202311212359
ISSUE DATE / TIME: 202310250256
Unit Type and Rh: 9500

## 2021-11-15 MED ORDER — APIXABAN 5 MG PO TABS
5.0000 mg | ORAL_TABLET | Freq: Two times a day (BID) | ORAL | Status: DC
  Administered 2021-11-15 – 2021-11-28 (×26): 5 mg via ORAL
  Filled 2021-11-15 (×27): qty 1

## 2021-11-15 MED ORDER — METOPROLOL SUCCINATE ER 25 MG PO TB24
25.0000 mg | ORAL_TABLET | Freq: Every day | ORAL | Status: DC
  Administered 2021-11-15 – 2021-11-28 (×13): 25 mg via ORAL
  Filled 2021-11-15 (×14): qty 1

## 2021-11-15 NOTE — Progress Notes (Addendum)
      [address]       [place] [address]             [phone]       3 Days Post-Op Procedure(s) (LRB): XI ROBOTIC ASSISTED THORACOSCOPY-RIGHT UPPER LOBECTOMY (Right) LYMPH NODE DISSECTION (Right) INTERCOSTAL NERVE BLOCK (Right)  Subjective: Patient without specific complaint this am  Objective: Vital signs in last 24 hours: Temp:  [97.7 F (36.5 C)-98.1 F (36.7 C)] 98 F (36.7 C) (10/26 0328) Pulse Rate:  [53-62] 61 (10/25 1655) Cardiac Rhythm: Normal sinus rhythm (10/25 1940) Resp:  [13-18] 18 (10/26 0328) BP: (100-124)/(50-69) 100/62 (10/26 0328) SpO2:  [96 %-100 %] 98 % (10/25 1655)      Intake/Output from previous day: 10/25 0701 - 10/26 0700 In: 1200 [P.O.:1200] Out: 1664 [Urine:1525; Chest Tube:139]   Physical Exam:  Cardiovascular: RRR Pulmonary: Clear to auscultation on left and rub with CT on right Abdomen: Soft, non tender, bowel sounds present. Extremities: No lower extremity edema. Wounds: Clean and dry.  No erythema or signs of infection. Chest Tube: to water seal, ++ air leak   Lab Results: CBC: Recent Labs    11/13/21 0050 11/14/21 0116 11/14/21 0842  WBC 8.8 9.4  --   HGB 7.3* 6.8* 8.1*  HCT 22.6* 21.4* 24.0*  PLT 119* 111*  --    BMET:  Recent Labs    11/13/21 0050 11/14/21 0116  NA 136 138  K 5.0 4.6  CL 108 111  CO2 23 22  GLUCOSE 157* 101*  BUN 14 17  CREATININE 1.08 1.06  CALCIUM 8.3* 8.2*    PT/INR: No results for input(s): "LABPROT", "INR" in the last 72 hours. ABG:  INR: Will add last result for INR, ABG once components are confirmed Will add last 4 CBG results once components are confirmed  Assessment/Plan:  1. CV - SR. On Toprol XL 50 mg daily but will decrease as SBP and HR labile. Apixaban has been restarted. 2.  Pulmonary - On room air. Chest tube with 139 cc last 24 hours.. Chest tube is to water seal, ++ air leak. CXR this am appears to show right pneumothorax, stable subcutaneous  emphysema right lateral chest wall . Encourage incentive spirometer. Await final pathology for staging 3. Anemia-He was given PRBC yesterday. H and H prior to surgery was 8.5 and 26.4. Re check CBC in am. 4. On Lovenox for DVT prophylaxis  Donielle M ZimmermanPA-C 11/15/2021,7:14 AM  Patient seen and examined, agree with above CXR actually shows a larger pneumo/ space this AM, tolerating well. Will leave on water seal for now Restart Eliquis Path pending Pain well controlled  Remo Lipps C. Roxan Hockey, MD [facility] [phone]

## 2021-11-15 NOTE — Progress Notes (Signed)
Mobility Specialist Progress Note    11/15/21 0911  Mobility  Activity Ambulated with assistance in hallway  Level of Assistance Standby assist, set-up cues, supervision of patient - no hands on  Assistive Device Front wheel walker  Distance Ambulated (ft) 170 ft  Activity Response Tolerated well  Mobility Referral Yes  $Mobility charge 1 Mobility   Pre-Mobility: 88 HR, 98% SpO2 During Mobility: 81 HR, 95% SpO2 Post-Mobility: 74 HR, 100% SpO2  Pt received standing EOB and agreeable. No complaints on walk. Returned to sitting EOB with call bell in reach.    Hildred Alamin Mobility Specialist  Secure Chat Only

## 2021-11-15 NOTE — Progress Notes (Signed)
Chest view from 6am this morning called to Erin/PA at 9:30am---results showing increased right pneumo--patient's vitals are normal, asymptomatic

## 2021-11-15 NOTE — Progress Notes (Signed)
Mobility Specialist Progress Note    11/15/21 1629  Mobility  Activity Ambulated with assistance in hallway  Level of Assistance Minimal assist, patient does 75% or more  Assistive Device Front wheel walker  Distance Ambulated (ft) 360 ft  Activity Response Tolerated well  Mobility Referral Yes  $Mobility charge 1 Mobility   Pre-Mobility: 75 HR,100% SpO2 Post-Mobility: 60 HR, 114/69 (83) BP, 100% SpO2  Pt received sitting EOB and agreeable. MinA to stand and minG during w/ x1 small LOB requiring minA to recover. No complaints on walk. Returned to chair with call bell in reach.    Hildred Alamin Mobility Specialist  Secure Chat Only

## 2021-11-16 ENCOUNTER — Inpatient Hospital Stay (HOSPITAL_COMMUNITY): Payer: Medicare Other

## 2021-11-16 LAB — BASIC METABOLIC PANEL
Anion gap: 6 (ref 5–15)
BUN: 15 mg/dL (ref 8–23)
CO2: 21 mmol/L — ABNORMAL LOW (ref 22–32)
Calcium: 8.4 mg/dL — ABNORMAL LOW (ref 8.9–10.3)
Chloride: 111 mmol/L (ref 98–111)
Creatinine, Ser: 1.05 mg/dL (ref 0.61–1.24)
GFR, Estimated: >60 mL/min (ref >60)
Glucose, Bld: 85 mg/dL (ref 70–99)
Potassium: 4.6 mmol/L (ref 3.5–5.1)
Sodium: 138 mmol/L (ref 135–145)

## 2021-11-16 LAB — CBC
HCT: 26.1 % — ABNORMAL LOW (ref 39.0–52.0)
Hemoglobin: 8.7 g/dL — ABNORMAL LOW (ref 13.0–17.0)
MCH: 34.8 pg — ABNORMAL HIGH (ref 26.0–34.0)
MCHC: 33.3 g/dL (ref 30.0–36.0)
MCV: 104.4 fL — ABNORMAL HIGH (ref 80.0–100.0)
Platelets: 109 10*3/uL — ABNORMAL LOW (ref 150–400)
RBC: 2.5 MIL/uL — ABNORMAL LOW (ref 4.22–5.81)
RDW: 19.9 % — ABNORMAL HIGH (ref 11.5–15.5)
WBC: 8.8 10*3/uL (ref 4.0–10.5)
nRBC: 0 % (ref 0.0–0.2)

## 2021-11-16 MED ORDER — ACETAMINOPHEN 500 MG PO TABS
1000.0000 mg | ORAL_TABLET | Freq: Once | ORAL | Status: DC
  Filled 2021-11-16: qty 2

## 2021-11-16 MED ORDER — ACETAMINOPHEN 500 MG PO TABS
ORAL_TABLET | ORAL | Status: AC
  Filled 2021-11-16: qty 2

## 2021-11-16 NOTE — Plan of Care (Signed)
  Problem: Activity: Goal: Risk for activity intolerance will decrease Outcome: Progressing   Problem: Pain Management: Goal: Pain level will decrease Outcome: Progressing   Problem: Clinical Measurements: Goal: Respiratory complications will improve Outcome: Progressing Goal: Cardiovascular complication will be avoided Outcome: Progressing   Problem: Activity: Goal: Risk for activity intolerance will decrease Outcome: Progressing   Problem: Nutrition: Goal: Adequate nutrition will be maintained Outcome: Progressing

## 2021-11-16 NOTE — Progress Notes (Signed)
      [address]       [place] [address]             [phone]       4 Days Post-Op Procedure(s) (LRB): XI ROBOTIC ASSISTED THORACOSCOPY-RIGHT UPPER LOBECTOMY (Right) LYMPH NODE DISSECTION (Right) INTERCOSTAL NERVE BLOCK (Right)  Subjective: Patient states his breathing is ok. He sounds more nasally this am  Objective: Vital signs in last 24 hours: Temp:  [97.5 F (36.4 C)-98.1 F (36.7 C)] 98 F (36.7 C) (10/27 0456) Pulse Rate:  [60-67] 61 (10/26 2042) Cardiac Rhythm: Normal sinus rhythm (10/26 2042) Resp:  [13-20] 13 (10/26 2042) BP: (102-127)/(52-79) 121/60 (10/27 0456) SpO2:  [98 %-100 %] 100 % (10/26 2042)      Intake/Output from previous day: 10/26 0701 - 10/27 0700 In: 600 [P.O.:600] Out: 2604 [Urine:2600; Chest Tube:4]   Physical Exam:  Cardiovascular: RRR Pulmonary: Clear to auscultation on left and diminished on right. ++ subcutaneous emphysema right chest/neck/left neck Abdomen: Soft, non tender, bowel sounds present. Extremities: No lower extremity edema. Wounds: Clean and dry.  No erythema or signs of infection. Chest Tube: to water seal, ++ air leak   Lab Results: CBC: Recent Labs    11/14/21 0116 11/14/21 0842 11/16/21 0021  WBC 9.4  --  8.8  HGB 6.8* 8.1* 8.7*  HCT 21.4* 24.0* 26.1*  PLT 111*  --  109*    BMET:  Recent Labs    11/14/21 0116 11/16/21 0021  NA 138 138  K 4.6 4.6  CL 111 111  CO2 22 21*  GLUCOSE 101* 85  BUN 17 15  CREATININE 1.06 1.05  CALCIUM 8.2* 8.4*     PT/INR: No results for input(s): "LABPROT", "INR" in the last 72 hours. ABG:  INR: Will add last result for INR, ABG once components are confirmed Will add last 4 CBG results once components are confirmed  Assessment/Plan:  1. CV - History of a fib. SR. On Toprol XL 25 mg daily and Apixaban. 2.  Pulmonary - On room air. Chest tube with scant output last 24 hours. Chest tube is to water seal, ++ air leak. CXR this am appears to  show right pneumothorax, increased subcutaneous emphysema. Will place back to suction. Encourage incentive spirometer. Await final pathology for staging 3. Anemia-He was given PRBC previously. H and H this am improved at 8.7 and 26.1 4. Thrombocytopenia-platelets this am 109,000. 5. On Lovenox for DVT prophylaxis  Dayami Taitt M ZimmermanPA-C 11/16/2021,7:31 AM

## 2021-11-16 NOTE — TOC Progression Note (Signed)
Transition of Care [facility]) - Progression Note    Patient Details  Name: Darryl Ward MRN: [id number] Date of Birth: April 27, 1945  [facility]) CM/SW Contact  Zenon Mayo, RN Phone Number: 11/16/2021, 1:09 PM  Clinical Narrative:    POD 4 Lobectomy, has an unexpected air leak, chest tube placed back to suction , cxr increased ptx.  TOC following.         Expected Discharge Plan and Services                                                 Social Determinants of Health (SDOH) Interventions    Readmission Risk Interventions     No data to display

## 2021-11-16 NOTE — Plan of Care (Signed)
  Problem: Activity: Goal: Risk for activity intolerance will decrease Outcome: Progressing   Problem: Respiratory: Goal: Respiratory status will improve Outcome: Progressing   Problem: Pain Management: Goal: Pain level will decrease Outcome: Progressing   Problem: Clinical Measurements: Goal: Respiratory complications will improve Outcome: Progressing Goal: Cardiovascular complication will be avoided Outcome: Progressing   Problem: Activity: Goal: Risk for activity intolerance will decrease Outcome: Progressing   Problem: Nutrition: Goal: Adequate nutrition will be maintained Outcome: Progressing

## 2021-11-16 NOTE — Progress Notes (Signed)
Radiology report from chest xray this am, showing more increased pneumo from yesterday's xray, Donielle/PA notified by phone, patient has arleaDY been placed back to suction

## 2021-11-17 ENCOUNTER — Inpatient Hospital Stay (HOSPITAL_COMMUNITY): Payer: Medicare Other

## 2021-11-17 NOTE — Plan of Care (Signed)

## 2021-11-17 NOTE — Progress Notes (Addendum)
      [address]       [place] [address]             [phone]       5 Days Post-Op Procedure(s) (LRB): XI ROBOTIC ASSISTED THORACOSCOPY-RIGHT UPPER LOBECTOMY (Right) LYMPH NODE DISSECTION (Right) INTERCOSTAL NERVE BLOCK (Right)  Subjective: Patient still sounds nasally this am  Objective: Vital signs in last 24 hours: Temp:  [97.8 F (36.6 C)-98.3 F (36.8 C)] 97.8 F (36.6 C) (10/28 0725) Pulse Rate:  [64-80] 80 (10/28 0725) Cardiac Rhythm: Normal sinus rhythm (10/28 0700) Resp:  [18-20] 20 (10/28 0725) BP: (100-119)/(61-70) 100/64 (10/28 0725) SpO2:  [92 %-100 %] 92 % (10/28 0725)     Intake/Output from previous day: 10/27 0701 - 10/28 0700 In: 840 [P.O.:840] Out: 1311 [Urine:900; Stool:300; Chest Tube:111]   Physical Exam:  Cardiovascular: RRR Pulmonary: Clear to auscultation on left and coarse on right. ++ subcutaneous emphysema right chest/neck/left neck Abdomen: Soft, non tender, bowel sounds present. Extremities: No lower extremity edema. Wounds: Clean and dry.  No erythema or signs of infection. Chest Tube: to suction,  ++ air leak (less than previously)  Lab Results: CBC: Recent Labs    11/16/21 0021  WBC 8.8  HGB 8.7*  HCT 26.1*  PLT 109*    BMET:  Recent Labs    11/16/21 0021  NA 138  K 4.6  CL 111  CO2 21*  GLUCOSE 85  BUN 15  CREATININE 1.05  CALCIUM 8.4*     PT/INR: No results for input(s): "LABPROT", "INR" in the last 72 hours. ABG:  INR: Will add last result for INR, ABG once components are confirmed Will add last 4 CBG results once components are confirmed  Assessment/Plan:  1. CV - History of a fib. SR. On Toprol XL 25 mg daily and Apixaban. 2.  Pulmonary - On room air. Chest tube with 111 cc last 24 hours. Chest tube is to suction since 10/27 am, ++ air leak (less so than previously). CXR this am appears to show much decreased right pneumothorax, extensive subcutaneous emphysema. Continue chest  tube to suction today. Encourage incentive spirometer. Await final pathology for staging 3. Anemia-He was given PRBC previously. H and H this am improved at 8.7 and 26.1 4. Thrombocytopenia-platelets this am 109,000. 5. On Lovenox for DVT prophylaxis  Donielle M ZimmermanPA-C 11/17/2021,9:15 AM   I have personally reviewed the above and agree with the findings and the plan

## 2021-11-17 NOTE — Progress Notes (Signed)
Mobility Specialist Progress Note   11/17/21 1142  Mobility  Activity Ambulated with assistance in hallway;Transferred from bed to chair  Level of Assistance Contact guard assist, steadying assist  Assistive Device Front wheel walker  Distance Ambulated (ft) 176 ft  Activity Response Tolerated well  $Mobility charge 1 Mobility   Pre Mobility: 65 HR, 125/72(86) BP, 99% SpO2 During Mobility: 89 HR, 91% SpO2 Post Mobility: 68 HR, 107/77(87) BP, 100% SpO2  Received in bed sleep but easily aroused, pt agreeable to mobility. No faults during ambulation but pt having lateral lean when pushing RW, requiring cues for self correction. Returned to chair w/ no complaints and call bell in reach.  Holland Falling Mobility Specialist Acute Rehab Office:  [phone]

## 2021-11-18 ENCOUNTER — Inpatient Hospital Stay (HOSPITAL_COMMUNITY): Payer: Medicare Other

## 2021-11-18 NOTE — Progress Notes (Addendum)
      [address]       [place] [address]             [phone]       6 Days Post-Op Procedure(s) (LRB): XI ROBOTIC ASSISTED THORACOSCOPY-RIGHT UPPER LOBECTOMY (Right) LYMPH NODE DISSECTION (Right) INTERCOSTAL NERVE BLOCK (Right)  Subjective: Patient keeps asking when he is going home.  Objective: Vital signs in last 24 hours: Temp:  [97.5 F (36.4 C)-98.2 F (36.8 C)] 98.2 F (36.8 C) (10/29 0709) Pulse Rate:  [59-74] 74 (10/29 0709) Cardiac Rhythm: Normal sinus rhythm (10/29 0709) Resp:  [14-20] 14 (10/29 0600) BP: (107-120)/(55-63) 120/56 (10/29 0600) SpO2:  [100 %] 100 % (10/29 0709)     Intake/Output from previous day: 10/28 0701 - 10/29 0700 In: -  Out: 336 [Chest Tube:336]   Physical Exam:  Cardiovascular: RRR Pulmonary: Clear to auscultation on left and coarse on right. ++ subcutaneous emphysema right chest/neck/left neck Abdomen: Soft, non tender, bowel sounds present. Extremities: No lower extremity edema. Wounds: Clean and dry.  No erythema or signs of infection. Chest Tube: to water seal,  + air leak that increases with cough  Lab Results: CBC: Recent Labs    11/16/21 0021  WBC 8.8  HGB 8.7*  HCT 26.1*  PLT 109*    BMET:  Recent Labs    11/16/21 0021  NA 138  K 4.6  CL 111  CO2 21*  GLUCOSE 85  BUN 15  CREATININE 1.05  CALCIUM 8.4*     PT/INR: No results for input(s): "LABPROT", "INR" in the last 72 hours. ABG:  INR: Will add last result for INR, ABG once components are confirmed Will add last 4 CBG results once components are confirmed  Assessment/Plan:  1. CV - History of a fib. SR. On Toprol XL 25 mg daily and Apixaban. 2.  Pulmonary - On room air. Chest tube with 336 cc last 24 hours. Chest tube is to suction since 10/27 am, + air leak that increases with cough, but improving. CXR this am appears stable (right pneumothorax, extensive subcutaneous emphysema).  Encourage incentive spirometer. As  discussed with surgeon, will leave chest tube to water seal today. Await final pathology for staging 3. Anemia-He was given PRBC previously. Last H and H improved to 8.7 and 26.1 4. Thrombocytopenia-Last platelets 109,000. 5. On Lovenox for DVT prophylaxis  Donielle M ZimmermanPA-C 11/18/2021,9:35 AM  I have reviewed the above and agree with the plan

## 2021-11-18 NOTE — Plan of Care (Signed)

## 2021-11-18 NOTE — Progress Notes (Signed)
   11/18/21 1553  Mobility  Activity Ambulated with assistance in hallway  Level of Assistance Contact guard assist, steadying assist  Assistive Device Front wheel walker  Distance Ambulated (ft) 140 ft  Activity Response Tolerated well  Mobility Referral Yes  $Mobility charge 1 Mobility   Mobility Specialist Progress Note  Received pt in bed having no complaints and agreeable to mobility. Pt was asymptomatic throughout ambulation and returned to room w/o fault. Left in bed w/ call bell in reach and all needs met.   Darryl Ward Mobility Specialist

## 2021-11-19 ENCOUNTER — Inpatient Hospital Stay (HOSPITAL_COMMUNITY): Payer: Medicare Other

## 2021-11-19 LAB — CBC
HCT: 28.4 % — ABNORMAL LOW (ref 39.0–52.0)
Hemoglobin: 9.2 g/dL — ABNORMAL LOW (ref 13.0–17.0)
MCH: 33.9 pg (ref 26.0–34.0)
MCHC: 32.4 g/dL (ref 30.0–36.0)
MCV: 104.8 fL — ABNORMAL HIGH (ref 80.0–100.0)
Platelets: 136 10*3/uL — ABNORMAL LOW (ref 150–400)
RBC: 2.71 MIL/uL — ABNORMAL LOW (ref 4.22–5.81)
RDW: 18 % — ABNORMAL HIGH (ref 11.5–15.5)
WBC: 8.4 10*3/uL (ref 4.0–10.5)
nRBC: 0 % (ref 0.0–0.2)

## 2021-11-19 NOTE — Plan of Care (Signed)
  Problem: Education: Goal: Knowledge of disease or condition will improve Outcome: Progressing Goal: Knowledge of the prescribed therapeutic regimen will improve Outcome: Progressing   Problem: Activity: Goal: Risk for activity intolerance will decrease Outcome: Progressing   Problem: Cardiac: Goal: Will achieve and/or maintain hemodynamic stability Outcome: Progressing   Problem: Clinical Measurements: Goal: Postoperative complications will be avoided or minimized Outcome: Progressing   Problem: Pain Management: Goal: Pain level will decrease Outcome: Progressing   Problem: Skin Integrity: Goal: Wound healing without signs and symptoms infection will improve Outcome: Progressing

## 2021-11-19 NOTE — Progress Notes (Signed)
Mobility Specialist Progress Note   11/19/21 1000  Mobility  Activity Ambulated with assistance in hallway;Dangled on edge of bed  Level of Assistance Standby assist, set-up cues, supervision of patient - no hands on  Assistive Device Front wheel walker  Distance Ambulated (ft) 260 ft  Range of Motion/Exercises Active;All extremities  Activity Response Tolerated well   Patient received in supine asleep and easily aroused, agreeable to participate. Ambulated supervision level with slow steady gait. Required cues to stay proximal to RW while ambulating.  Returned to room without complaint or incident. Was left dangling EOB with all needs met, call bell in reach.   [name], [place], [place]  Office: [phone]

## 2021-11-19 NOTE — Progress Notes (Signed)
Chest x-ray result relayed by radiologist,. TCTS PA made aware and claimed to have seen the x-ray with order to repeat chest-x-ray in am.

## 2021-11-19 NOTE — Progress Notes (Addendum)
Noted with puffiness to right eye lid, right ant. chest, back. Denied any discomfort. PA made aware and seen  pt at once. Placed on  neg 20 cm suction. Pleuravac changed by PA. Still with air leak. Continue to monitor.

## 2021-11-19 NOTE — Progress Notes (Addendum)
      [address]       [place] [address]             [phone]       7 Days Post-Op Procedure(s) (LRB): XI ROBOTIC ASSISTED THORACOSCOPY-RIGHT UPPER LOBECTOMY (Right) LYMPH NODE DISSECTION (Right) INTERCOSTAL NERVE BLOCK (Right)  Subjective: Patient denies breathing complaints this am. He did walk yesterday.  Objective: Vital signs in last 24 hours: Temp:  [97.7 F (36.5 C)-98.3 F (36.8 C)] 98.1 F (36.7 C) (10/30 0435) Pulse Rate:  [61-79] 79 (10/29 2348) Cardiac Rhythm: Normal sinus rhythm (10/29 1910) Resp:  [12-20] 20 (10/29 2348) BP: (111-123)/(54-72) 123/67 (10/29 2348) SpO2:  [96 %-100 %] 96 % (10/29 2348)     Intake/Output from previous day: 10/29 0701 - 10/30 0700 In: 560 [P.O.:560] Out: 1140 [Urine:1000; Chest Tube:140]   Physical Exam:  Cardiovascular: RRR Pulmonary: Clear to auscultation on left and coarse on right. ++ subcutaneous emphysema right chest/neck/left neck Abdomen: Soft, non tender, bowel sounds present. Extremities: No lower extremity edema. Wounds: Clean and dry.  No erythema or signs of infection. Chest Tube: to water seal,  + air leak that increases with cough  Lab Results: CBC: Recent Labs    11/19/21 0018  WBC 8.4  HGB 9.2*  HCT 28.4*  PLT 136*    BMET:  No results for input(s): "NA", "K", "CL", "CO2", "GLUCOSE", "BUN", "CREATININE", "CALCIUM" in the last 72 hours.   PT/INR: No results for input(s): "LABPROT", "INR" in the last 72 hours. ABG:  INR: Will add last result for INR, ABG once components are confirmed Will add last 4 CBG results once components are confirmed  Assessment/Plan:  1. CV - History of a fib. SR. SR. On Toprol XL 25 mg daily and Apixaban. 2.  Pulmonary - On room air. Chest tube with 140 cc last 24 hours. Chest tube is to water seal, + air leak that increases with cough, but improving. CXR this am appears to show increased right pneumothorax, subcutaneous emphysema.  Dr.  Roxan Hockey to decide if needs IVBs. Encourage incentive spirometer. Await final pathology for staging 3. Anemia-He was given PRBC previously. H and H this am  improved to 9.2 and 28.4 4. Thrombocytopenia-Platelets this am up to 136,000. 5. On Lovenox for DVT prophylaxis  Donielle M ZimmermanPA-C 11/19/2021,7:31 AM   Patient seen and examined, agree with above Lung dropped on water seal- place back to suction May require IBV Path still pending  Remo Lipps C. Roxan Hockey, MD [facility] [phone]

## 2021-11-19 NOTE — Progress Notes (Signed)
      [address]       [place] [address]             [phone]    17:40 Called to see patient regarding extension of subcu air and to the right neck and face.  Darryl Ward is awake and alert and in no distress.  Oxygen saturation is in the 90s.  Respirations are nonlabored.  He has obvious subcu air in his right eyelid right neck and in his chest and back.  Chest tube is now connected to suction but this was only done about half an hour ago.  The waterseal chamber was only partially filled and I was concerned that the Pleur-evac was not functioning appropriately.  A new Pleur-evac was placed and connected to -20 cm water suction. Darryl Ward's respiratory status is stable.  Macarthur Critchley, PA-C

## 2021-11-20 ENCOUNTER — Inpatient Hospital Stay (HOSPITAL_COMMUNITY): Payer: Medicare Other

## 2021-11-20 LAB — SURGICAL PATHOLOGY

## 2021-11-20 NOTE — Progress Notes (Addendum)
      [address]       [place] [address]             [phone]       8 Days Post-Op Procedure(s) (LRB): XI ROBOTIC ASSISTED THORACOSCOPY-RIGHT UPPER LOBECTOMY (Right) LYMPH NODE DISSECTION (Right) INTERCOSTAL NERVE BLOCK (Right)  Subjective: Patient denies breathing complaints this am. He denies swallowing or visual complaints (subQ air)  Objective: Vital signs in last 24 hours: Temp:  [97.6 F (36.4 C)-98.6 F (37 C)] 98.6 F (37 C) (10/31 0700) Pulse Rate:  [70-97] 97 (10/31 0700) Cardiac Rhythm: Normal sinus rhythm;Bundle branch block (10/30 1900) Resp:  [17-20] 17 (10/31 0700) BP: (106-122)/(57-87) 106/64 (10/31 0700) SpO2:  [96 %-99 %] 96 % (10/31 0700)     Intake/Output from previous day: 10/30 0701 - 10/31 0700 In: 690 [P.O.:690] Out: 720 [Urine:500; Chest Tube:220]   Physical Exam:  Cardiovascular: RRR Pulmonary: Clear to auscultation on left and coarse on right. ++ subcutaneous emphysema right chest/neck/right arm/right eye/left neck and chest Abdomen: Soft, non tender, bowel sounds present. Extremities: No lower extremity edema. Wounds: Clean and dry.  No erythema or signs of infection. Chest Tube: to water seal,  +++ air leak  with cough  Lab Results: CBC: Recent Labs    11/19/21 0018  WBC 8.4  HGB 9.2*  HCT 28.4*  PLT 136*    BMET:  No results for input(s): "NA", "K", "CL", "CO2", "GLUCOSE", "BUN", "CREATININE", "CALCIUM" in the last 72 hours.   PT/INR: No results for input(s): "LABPROT", "INR" in the last 72 hours. ABG:  INR: Will add last result for INR, ABG once components are confirmed Will add last 4 CBG results once components are confirmed  Assessment/Plan:  1. CV - History of a fib. SR. SR. On Toprol XL 25 mg daily and Apixaban. 2.  Pulmonary - On room air. Chest tube with 220 cc last 24 hours. Chest tube is to water seal, +++ air leak with cough, but improving. CXR this am appears improved regarding right  pneumothorax, very extensive subcutaneous emphysema.  Will need IVBs. Encourage incentive spirometer. Await final pathology for staging 3. Anemia-He was given PRBC previously. Last H and H  improved to 9.2 and 28.4 4. Thrombocytopenia-Platelets this am up to 136,000. 5. On Lovenox for DVT prophylaxis  Donielle M ZimmermanPA-C 11/20/2021,7:19 AM  Patient seen and examined, agree with above CXR better on suction, still has air leak but has decreased Still with extensive SQ emphysema Will likely need IBV, probably Thursday  Karo Rog C. Roxan Hockey, MD [facility] [phone]

## 2021-11-21 ENCOUNTER — Inpatient Hospital Stay (HOSPITAL_COMMUNITY): Payer: Medicare Other

## 2021-11-21 NOTE — TOC Progression Note (Signed)
Transition of Care [facility]) - Progression Note    Patient Details  Name: Darryl Ward MRN: [id number] Date of Birth: Mar 18, 1945  Transition [facility]) CM/SW Contact  Zenon Mayo, RN Phone Number: 11/21/2021, 6:05 PM  Clinical Narrative:    conts with sub q air, chest tube , plan for bronchial valves surgery tomorrow. Plan to go home on [facility] at dc.  TOC following.        Expected Discharge Plan and Services                                                 Social Determinants of Health (SDOH) Interventions    Readmission Risk Interventions     No data to display

## 2021-11-21 NOTE — Progress Notes (Addendum)
      [address]       [place] [address]             [phone]       9 Days Post-Op Procedure(s) (LRB): XI ROBOTIC ASSISTED THORACOSCOPY-RIGHT UPPER LOBECTOMY (Right) LYMPH NODE DISSECTION (Right) INTERCOSTAL NERVE BLOCK (Right)  Subjective: Patient denies breathing complaints this am.   Objective: Vital signs in last 24 hours: Temp:  [97.3 F (36.3 C)-98.7 F (37.1 C)] 97.3 F (36.3 C) (11/01 0400) Pulse Rate:  [69-95] 95 (11/01 0400) Cardiac Rhythm: Normal sinus rhythm (10/31 1900) Resp:  [13-17] 15 (11/01 0400) BP: (100-122)/(58-74) 109/61 (11/01 0400) SpO2:  [96 %-100 %] 97 % (11/01 0400)     Intake/Output from previous day: 10/31 0701 - 11/01 0700 In: 720 [P.O.:720] Out: 1650 [Urine:1500; Chest Tube:150]   Physical Exam:  Cardiovascular: RRR Pulmonary: Clear to auscultation on left and coarse on right. ++ subcutaneous emphysema right chest/neck/right arm/right eye/left neck and chest (decreased air in face as right eye more open) Abdomen: Soft, non tender, bowel sounds present. Extremities: No lower extremity edema. Wounds: Clean and dry.  No erythema or signs of infection. Chest Tube: to water seal,  +++ air leak  with cough  Lab Results: CBC: Recent Labs    11/19/21 0018  WBC 8.4  HGB 9.2*  HCT 28.4*  PLT 136*    BMET:  No results for input(s): "NA", "K", "CL", "CO2", "GLUCOSE", "BUN", "CREATININE", "CALCIUM" in the last 72 hours.   PT/INR: No results for input(s): "LABPROT", "INR" in the last 72 hours. ABG:  INR: Will add last result for INR, ABG once components are confirmed Will add last 4 CBG results once components are confirmed  Assessment/Plan:  1. CV - History of a fib. SR. SR. On Toprol XL 25 mg daily and Apixaban. 2.  Pulmonary - On room air. Chest tube with 150 cc last 24 hours. Chest tube is to water seal, +++ air leak with cough, but improving. CXR this am appears similar to yesterday's CXR (very extensive  subcutaneous emphysema).  Will need IVBs. Encourage incentive spirometer. Await final pathology for staging 3. Anemia-He was given PRBC previously. Last H and H  improved to 9.2 and 28.4 4. Thrombocytopenia-Platelets this am up to 136,000. 5. On Lovenox for DVT prophylaxis  Donielle M ZimmermanPA-C 11/21/2021,7:15 AM  Patient seen and examined, agree with above Air leak persists- still relatively large.  Options are reexploration vs IBV.   I recommended to Mr. Hard that we proceed with bronch for IBV placement tomorrow.  I informed him of the general nature of the procedure and the need to do a bronch in 6-8 weeks to remove the valves.  I informed him of the indications, risks, benefits and alternatives.  He understands the risks include but are not limited to MI, stroke, blood clots, failure to resolve leak.  He accepts the risks and agrees to proceed  Remo Lipps C. Roxan Hockey, MD [facility] [phone]

## 2021-11-21 NOTE — Plan of Care (Signed)
  Problem: Education: Goal: Knowledge of disease or condition will improve Outcome: Progressing   Problem: Activity: Goal: Risk for activity intolerance will decrease Outcome: Progressing   Problem: Cardiac: Goal: Will achieve and/or maintain hemodynamic stability Outcome: Progressing   Problem: Clinical Measurements: Goal: Will remain free from infection Outcome: Progressing

## 2021-11-21 NOTE — Anesthesia Preprocedure Evaluation (Signed)
Anesthesia Evaluation  Patient identified by MRN, date of birth, ID band Patient awake    Reviewed: Allergy & Precautions, H&P , NPO status , Patient's Chart, lab work & pertinent test results  Airway Mallampati: III  TM Distance: >3 FB Neck ROM: Full    Dental no notable dental hx. (+) Edentulous Upper, Edentulous Lower, Dental Advisory Given   Pulmonary COPD, former smoker RUL adeno CA    + decreased breath sounds      Cardiovascular hypertension, + CAD, + Past MI, + Peripheral Vascular Disease and +CHF  + dysrhythmias Atrial Fibrillation + Valvular Problems/Murmurs (mild-mod TR) MR  Rhythm:Regular Rate:Normal  Echo 2023 1. Limited study in terms of color Doppler and zooming capabilies of echocardiographic work station.  2. Left ventricular ejection fraction, by estimation, is 30 to 35%. The left ventricle has moderately decreased function. The left ventricle demonstrates global hypokinesis.  3. Right ventricular systolic function is mildly reduced. The right ventricular size is normal.  4. Left atrial size was moderately dilated. No left atrial/left atrial appendage thrombus was detected.  5. Right atrial size was moderately dilated.  6. The mitral valve is grossly normal. Mild mitral valve regurgitation.  7. Tricuspid valve regurgitation is mild to moderate.  8. The aortic valve is tricuspid. Aortic valve regurgitation is not visualized.  9. There is Moderate (Grade III) atheroma plaque involving the descending aorta.    Neuro/Psych TIACVA    GI/Hepatic   Endo/Other    Renal/GU Renal disease     Musculoskeletal  (+) Arthritis ,    Abdominal   Peds  Hematology  (+) Blood dyscrasia, anemia   Anesthesia Other Findings   Reproductive/Obstetrics                             Anesthesia Physical  Anesthesia Plan  ASA: 4  Anesthesia Plan: General   Post-op Pain Management:  Minimal or no pain anticipated   Induction: Intravenous  PONV Risk Score and Plan: 2 and Ondansetron, Dexamethasone, Midazolam and Treatment may vary due to age or medical condition  Airway Management Planned: Oral ETT  Additional Equipment: ClearSight  Intra-op Plan:   Post-operative Plan: Extubation in OR  Informed Consent: I have reviewed the patients History and Physical, chart, labs and discussed the procedure including the risks, benefits and alternatives for the proposed anesthesia with the patient or authorized representative who has indicated his/her understanding and acceptance.     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  Preoperative stratification per telephone encounter by Dr. Percival Spanish 10/26/2021, "The patient is to have right upper lobectomy October 23. This is robotic assisted. He is cardiovascular risk for major cardiovascular events given his cardiomyopathy and other comorbidities is 11%. I called and talked with the patient and his daughter about this. They understand. I do not think there is any testing presurgery that needs to be done.The patient understands the risks and would agree to proceed. We will certainly await available for any issues such as dysrhythmia. He had to have his volume watched very carefully given his reduced ejection fraction."  Patient reports last dose Eliquis 11/09/2021.  Former smoker with associated COPD.  60-pack-year history, quit ~3 months ago.  EKG  11/08/2021: Sinus rhythm with Premature atrial complexes.  Rate 65. Incomplete right bundle branch block  CT chest 10/08/2021: IMPRESSION: 1. Interval decrease in size of the ground-glass lesion in  the posterior right upper lobe with adjacent fiducial marker. No suspicious new or progressive findings. 2. Tiny 2 mm posterior left upper lobe pulmonary nodule, new since prior. Attention on follow-up recommended. 3. Aortic Atherosclerosis (ICD10-I70.0) and Emphysema (ICD10-J43.9).  TEE 09/20/2021: 1. Limited study in terms of color Doppler and zooming capabilies of  echocardiographic work station.  2. Left ventricular ejection fraction, by estimation, is 30 to 35%. The  left ventricle has moderately decreased function. The left ventricle  demonstrates global hypokinesis.  3. Right ventricular systolic function is mildly reduced. The right  ventricular size is normal.  4. Left atrial size was moderately dilated. No left atrial/left atrial  appendage thrombus was detected.  5. Right atrial size was moderately dilated.  6. The mitral valve is grossly normal. Mild mitral valve regurgitation.  7. Tricuspid valve regurgitation is mild to moderate.  8. The aortic valve is tricuspid. Aortic valve regurgitation is not  visualized.  9. There is Moderate (Grade III) atheroma plaque involving the descending  aorta.  )        Anesthesia Quick Evaluation

## 2021-11-21 NOTE — Plan of Care (Signed)
  Problem: Education: Goal: Knowledge of disease or condition will improve Outcome: Progressing Goal: Knowledge of the prescribed therapeutic regimen will improve Outcome: Progressing   Problem: Activity: Goal: Risk for activity intolerance will decrease Outcome: Progressing   Problem: Cardiac: Goal: Will achieve and/or maintain hemodynamic stability Outcome: Progressing   Problem: Pain Management: Goal: Pain level will decrease Outcome: Progressing   Problem: Education: Goal: Knowledge of General Education information will improve Description: Including pain rating scale, medication(s)/side effects and non-pharmacologic comfort measures Outcome: Progressing   Problem: Health Behavior/Discharge Planning: Goal: Ability to manage health-related needs will improve Outcome: Progressing

## 2021-11-21 NOTE — H&P (View-Only) (Signed)
      [address]       [place] [address]             [phone]       9 Days Post-Op Procedure(s) (LRB): XI ROBOTIC ASSISTED THORACOSCOPY-RIGHT UPPER LOBECTOMY (Right) LYMPH NODE DISSECTION (Right) INTERCOSTAL NERVE BLOCK (Right)  Subjective: Patient denies breathing complaints this am.   Objective: Vital signs in last 24 hours: Temp:  [97.3 F (36.3 C)-98.7 F (37.1 C)] 97.3 F (36.3 C) (11/01 0400) Pulse Rate:  [69-95] 95 (11/01 0400) Cardiac Rhythm: Normal sinus rhythm (10/31 1900) Resp:  [13-17] 15 (11/01 0400) BP: (100-122)/(58-74) 109/61 (11/01 0400) SpO2:  [96 %-100 %] 97 % (11/01 0400)     Intake/Output from previous day: 10/31 0701 - 11/01 0700 In: 720 [P.O.:720] Out: 1650 [Urine:1500; Chest Tube:150]   Physical Exam:  Cardiovascular: RRR Pulmonary: Clear to auscultation on left and coarse on right. ++ subcutaneous emphysema right chest/neck/right arm/right eye/left neck and chest (decreased air in face as right eye more open) Abdomen: Soft, non tender, bowel sounds present. Extremities: No lower extremity edema. Wounds: Clean and dry.  No erythema or signs of infection. Chest Tube: to water seal,  +++ air leak  with cough  Lab Results: CBC: Recent Labs    11/19/21 0018  WBC 8.4  HGB 9.2*  HCT 28.4*  PLT 136*    BMET:  No results for input(s): "NA", "K", "CL", "CO2", "GLUCOSE", "BUN", "CREATININE", "CALCIUM" in the last 72 hours.   PT/INR: No results for input(s): "LABPROT", "INR" in the last 72 hours. ABG:  INR: Will add last result for INR, ABG once components are confirmed Will add last 4 CBG results once components are confirmed  Assessment/Plan:  1. CV - History of a fib. SR. SR. On Toprol XL 25 mg daily and Apixaban. 2.  Pulmonary - On room air. Chest tube with 150 cc last 24 hours. Chest tube is to water seal, +++ air leak with cough, but improving. CXR this am appears similar to yesterday's CXR (very extensive  subcutaneous emphysema).  Will need IVBs. Encourage incentive spirometer. Await final pathology for staging 3. Anemia-He was given PRBC previously. Last H and H  improved to 9.2 and 28.4 4. Thrombocytopenia-Platelets this am up to 136,000. 5. On Lovenox for DVT prophylaxis  Darryl M ZimmermanPA-C 11/21/2021,7:15 AM  Patient seen and examined, agree with above Air leak persists- still relatively large.  Options are reexploration vs IBV.   I recommended to Darryl Ward that we proceed with bronch for IBV placement tomorrow.  I informed him of the general nature of the procedure and the need to do a bronch in 6-8 weeks to remove the valves.  I informed him of the indications, risks, benefits and alternatives.  He understands the risks include but are not limited to MI, stroke, blood clots, failure to resolve leak.  He accepts the risks and agrees to proceed  Remo Lipps C. Roxan Hockey, MD [facility] [phone]

## 2021-11-22 ENCOUNTER — Inpatient Hospital Stay (HOSPITAL_COMMUNITY): Payer: Medicare Other | Admitting: Anesthesiology

## 2021-11-22 ENCOUNTER — Encounter (HOSPITAL_COMMUNITY): Payer: Self-pay | Admitting: Thoracic Surgery (Cardiothoracic Vascular Surgery)

## 2021-11-22 ENCOUNTER — Encounter (HOSPITAL_COMMUNITY)
Admission: RE | Disposition: A | Discharge status: Home Health | Payer: Self-pay | Source: Home / Self Care | Attending: Thoracic Surgery (Cardiothoracic Vascular Surgery)

## 2021-11-22 DIAGNOSIS — I252 Old myocardial infarction: Secondary | ICD-10-CM

## 2021-11-22 DIAGNOSIS — I2510 Atherosclerotic heart disease of native coronary artery without angina pectoris: Secondary | ICD-10-CM

## 2021-11-22 DIAGNOSIS — J9382 Other air leak: Secondary | ICD-10-CM

## 2021-11-22 DIAGNOSIS — J95812 Postprocedural air leak: Secondary | ICD-10-CM | POA: Diagnosis not present

## 2021-11-22 DIAGNOSIS — I110 Hypertensive heart disease with heart failure: Secondary | ICD-10-CM

## 2021-11-22 DIAGNOSIS — I509 Heart failure, unspecified: Secondary | ICD-10-CM

## 2021-11-22 DIAGNOSIS — J449 Chronic obstructive pulmonary disease, unspecified: Secondary | ICD-10-CM

## 2021-11-22 DIAGNOSIS — Z87891 Personal history of nicotine dependence: Secondary | ICD-10-CM

## 2021-11-22 HISTORY — PX: VIDEO BRONCHOSCOPY WITH INSERTION OF INTERBRONCHIAL VALVE (IBV): SHX6178

## 2021-11-22 SURGERY — BRONCHOSCOPY, FLEXIBLE, WITH INTRABRONCHIAL VALVE INSERTION
Anesthesia: General

## 2021-11-22 MED ORDER — ONDANSETRON HCL 4 MG/2ML IJ SOLN
INTRAMUSCULAR | Status: AC
  Filled 2021-11-22: qty 2

## 2021-11-22 MED ORDER — DEXAMETHASONE SODIUM PHOSPHATE 10 MG/ML IJ SOLN
INTRAMUSCULAR | Status: AC
  Filled 2021-11-22: qty 1

## 2021-11-22 MED ORDER — SUGAMMADEX SODIUM 200 MG/2ML IV SOLN
INTRAVENOUS | Status: DC | PRN
  Administered 2021-11-22: 200 mg via INTRAVENOUS

## 2021-11-22 MED ORDER — ROCURONIUM BROMIDE 10 MG/ML (PF) SYRINGE
PREFILLED_SYRINGE | INTRAVENOUS | Status: AC
  Filled 2021-11-22: qty 10

## 2021-11-22 MED ORDER — 0.9 % SODIUM CHLORIDE (POUR BTL) OPTIME
TOPICAL | Status: DC | PRN
  Administered 2021-11-22: 1000 mL

## 2021-11-22 MED ORDER — PROPOFOL 10 MG/ML IV BOLUS
INTRAVENOUS | Status: DC | PRN
  Administered 2021-11-22: 100 mg via INTRAVENOUS

## 2021-11-22 MED ORDER — LIDOCAINE 2% (20 MG/ML) 5 ML SYRINGE
INTRAMUSCULAR | Status: DC | PRN
  Administered 2021-11-22: 80 mg via INTRAVENOUS

## 2021-11-22 MED ORDER — MEPERIDINE HCL 25 MG/ML IJ SOLN: 6.2500 mg | INTRAMUSCULAR | Status: DC | PRN

## 2021-11-22 MED ORDER — CHLORHEXIDINE GLUCONATE 0.12 % MT SOLN: 15.0000 mL | Freq: Once | OROMUCOSAL | Status: AC

## 2021-11-22 MED ORDER — EPHEDRINE SULFATE-NACL 50-0.9 MG/10ML-% IV SOSY
PREFILLED_SYRINGE | INTRAVENOUS | Status: DC | PRN
  Administered 2021-11-22: 5 mg via INTRAVENOUS

## 2021-11-22 MED ORDER — VASOPRESSIN 20 UNIT/ML IV SOLN
INTRAVENOUS | Status: AC
  Filled 2021-11-22: qty 1

## 2021-11-22 MED ORDER — MIDAZOLAM HCL 2 MG/2ML IJ SOLN
INTRAMUSCULAR | Status: DC | PRN
  Administered 2021-11-22: 1 mg via INTRAVENOUS

## 2021-11-22 MED ORDER — ONDANSETRON HCL 4 MG/2ML IJ SOLN
INTRAMUSCULAR | Status: DC | PRN
  Administered 2021-11-22: 4 mg via INTRAVENOUS

## 2021-11-22 MED ORDER — LACTATED RINGERS IV SOLN: INTRAVENOUS | Status: DC

## 2021-11-22 MED ORDER — PHENYLEPHRINE 80 MCG/ML (10ML) SYRINGE FOR IV PUSH (FOR BLOOD PRESSURE SUPPORT)
PREFILLED_SYRINGE | INTRAVENOUS | Status: DC | PRN
  Administered 2021-11-22 (×2): 80 ug via INTRAVENOUS
  Administered 2021-11-22: 120 ug via INTRAVENOUS

## 2021-11-22 MED ORDER — PROMETHAZINE HCL 25 MG/ML IJ SOLN: 6.2500 mg | INTRAMUSCULAR | Status: DC | PRN

## 2021-11-22 MED ORDER — LIDOCAINE 2% (20 MG/ML) 5 ML SYRINGE
INTRAMUSCULAR | Status: AC
  Filled 2021-11-22: qty 5

## 2021-11-22 MED ORDER — PHENYLEPHRINE 80 MCG/ML (10ML) SYRINGE FOR IV PUSH (FOR BLOOD PRESSURE SUPPORT)
PREFILLED_SYRINGE | INTRAVENOUS | Status: AC
  Filled 2021-11-22: qty 10

## 2021-11-22 MED ORDER — PROPOFOL 10 MG/ML IV BOLUS
INTRAVENOUS | Status: AC
  Filled 2021-11-22: qty 20

## 2021-11-22 MED ORDER — EPINEPHRINE PF 1 MG/ML IJ SOLN
INTRAMUSCULAR | Status: AC
  Filled 2021-11-22: qty 1

## 2021-11-22 MED ORDER — DEXAMETHASONE SODIUM PHOSPHATE 10 MG/ML IJ SOLN
INTRAMUSCULAR | Status: DC | PRN
  Administered 2021-11-22: 10 mg via INTRAVENOUS

## 2021-11-22 MED ORDER — CHLORHEXIDINE GLUCONATE 0.12 % MT SOLN
OROMUCOSAL | Status: AC
  Administered 2021-11-22: 15 mL via OROMUCOSAL
  Filled 2021-11-22: qty 15

## 2021-11-22 MED ORDER — MIDAZOLAM HCL 2 MG/2ML IJ SOLN
INTRAMUSCULAR | Status: AC
  Filled 2021-11-22: qty 2

## 2021-11-22 MED ORDER — FENTANYL CITRATE (PF) 250 MCG/5ML IJ SOLN
INTRAMUSCULAR | Status: DC | PRN
  Administered 2021-11-22: 100 ug via INTRAVENOUS
  Administered 2021-11-22: 25 ug via INTRAVENOUS

## 2021-11-22 MED ORDER — ORAL CARE MOUTH RINSE: 15.0000 mL | Freq: Once | OROMUCOSAL | Status: AC

## 2021-11-22 MED ORDER — EPHEDRINE 5 MG/ML INJ
INTRAVENOUS | Status: AC
  Filled 2021-11-22: qty 5

## 2021-11-22 MED ORDER — HYDROMORPHONE HCL 1 MG/ML IJ SOLN: 0.2500 mg | INTRAMUSCULAR | Status: DC | PRN

## 2021-11-22 MED ORDER — VASOPRESSIN 20 UNIT/ML IV SOLN
INTRAVENOUS | Status: DC | PRN
  Administered 2021-11-22: 1 [IU] via INTRAVENOUS

## 2021-11-22 MED ORDER — FENTANYL CITRATE (PF) 250 MCG/5ML IJ SOLN
INTRAMUSCULAR | Status: AC
  Filled 2021-11-22: qty 5

## 2021-11-22 MED ORDER — ROCURONIUM BROMIDE 10 MG/ML (PF) SYRINGE
PREFILLED_SYRINGE | INTRAVENOUS | Status: DC | PRN
  Administered 2021-11-22: 60 mg via INTRAVENOUS

## 2021-11-22 MED ORDER — PHENYLEPHRINE HCL-NACL 20-0.9 MG/250ML-% IV SOLN
INTRAVENOUS | Status: DC | PRN
  Administered 2021-11-22: 25 ug/min via INTRAVENOUS

## 2021-11-22 SURGICAL SUPPLY — 40 items
ADAPTER VALVE BIOPSY EBUS (MISCELLANEOUS) IMPLANT
ADPTR VALVE BIOPSY EBUS (MISCELLANEOUS)
CANISTER SUCT 3000ML PPV (MISCELLANEOUS) ×1 IMPLANT
CATH BALLN 4FR (CATHETERS) IMPLANT
CATH EMB 6FR 80CM (CATHETERS) IMPLANT
CATH LOADER DEPLOYMENT HUD (CATHETERS) IMPLANT
CNTNR URN SCR LID CUP LEK RST (MISCELLANEOUS) ×1 IMPLANT
CONT SPEC 4OZ STRL OR WHT (MISCELLANEOUS) ×2
COVER BACK TABLE 60X90IN (DRAPES) ×1 IMPLANT
FILTER STRAW FLUID ASPIR (MISCELLANEOUS) IMPLANT
FORCEPS BIOP RJ4 1.8 (CUTTING FORCEPS) IMPLANT
GAUZE 4X4 16PLY ~~LOC~~+RFID DBL (SPONGE) ×1 IMPLANT
GAUZE SPONGE 4X4 12PLY STRL (GAUZE/BANDAGES/DRESSINGS) ×1 IMPLANT
GLOVE SS BIOGEL STRL SZ 7.5 (GLOVE) ×1 IMPLANT
GLOVE SURG SIGNA 7.5 PF LTX (GLOVE) ×1 IMPLANT
GOWN STRL REUS W/ TWL XL LVL3 (GOWN DISPOSABLE) ×1 IMPLANT
GOWN STRL REUS W/TWL XL LVL3 (GOWN DISPOSABLE) ×2
KIT AIRWAY SIZING HUD (KITS) IMPLANT
KIT CLEAN ENDO COMPLIANCE (KITS) ×1 IMPLANT
KIT TURNOVER KIT B (KITS) ×1 IMPLANT
MARKER SKIN DUAL TIP RULER LAB (MISCELLANEOUS) ×1 IMPLANT
NS IRRIG 1000ML POUR BTL (IV SOLUTION) ×1 IMPLANT
OIL SILICONE PENTAX (PARTS (SERVICE/REPAIRS)) IMPLANT
STOPCOCK 4 WAY LG BORE MALE ST (IV SETS) IMPLANT
STOPCOCK MORSE 400PSI 3WAY (MISCELLANEOUS) ×1 IMPLANT
SYR 10ML LL (SYRINGE) ×1 IMPLANT
SYR 20ML ECCENTRIC (SYRINGE) ×1 IMPLANT
SYR 3ML LL SCALE MARK (SYRINGE) IMPLANT
TOWEL GREEN STERILE (TOWEL DISPOSABLE) ×1 IMPLANT
TOWEL GREEN STERILE FF (TOWEL DISPOSABLE) ×1 IMPLANT
TOWEL NATURAL 4PK STERILE (DISPOSABLE) ×1 IMPLANT
TRAP SPECIMEN MUCUS 40CC (MISCELLANEOUS) ×1 IMPLANT
TUBE CONNECTING 20X1/4 (TUBING) ×1 IMPLANT
UNDERPAD 30X36 HEAVY ABSORB (UNDERPADS AND DIAPERS) ×1 IMPLANT
VALVE BIOPSY  SINGLE USE (MISCELLANEOUS) ×1
VALVE BIOPSY SINGLE USE (MISCELLANEOUS) ×1 IMPLANT
VALVE IN CARTRIDGE 5MM HUD (Valve) IMPLANT
VALVE IN CARTRIDGE 7MM HUD (Valve) IMPLANT
VALVE SUCTION BRONCHIO DISP (MISCELLANEOUS) ×1 IMPLANT
WATER STERILE IRR 1000ML POUR (IV SOLUTION) ×1 IMPLANT

## 2021-11-22 NOTE — Plan of Care (Signed)

## 2021-11-22 NOTE — Interval H&P Note (Signed)
History and Physical Interval Note:  11/22/2021 7:54 AM  Darryl Ward  has presented today for surgery, with the diagnosis of Airleak.  The various methods of treatment have been discussed with the patient and family. After consideration of risks, benefits and other options for treatment, the patient has consented to  Procedure(s): VIDEO BRONCHOSCOPY WITH INSERTION OF INTERBRONCHIAL VALVE (IBV) (N/A) as a surgical intervention.  The patient's history has been reviewed, patient examined, no change in status, stable for surgery.  I have reviewed the patient's chart and labs.  Questions were answered to the patient's satisfaction.     [name]

## 2021-11-22 NOTE — Anesthesia Procedure Notes (Signed)
Procedure Name: Intubation Date/Time: 11/22/2021 8:21 AM  Performed by: Carolan Clines, CRNAPre-anesthesia Checklist: Patient identified, Emergency Drugs available, Suction available and Patient being monitored Patient Re-evaluated:Patient Re-evaluated prior to induction Oxygen Delivery Method: Circle System Utilized Preoxygenation: Pre-oxygenation with 100% oxygen Induction Type: IV induction Ventilation: Mask ventilation without difficulty and Oral airway inserted - appropriate to patient size Laryngoscope Size: Mac and 4 Grade View: Grade I Tube type: Oral Tube size: 8.5 mm Number of attempts: 1 Airway Equipment and Method: Stylet and Oral airway Placement Confirmation: ETT inserted through vocal cords under direct vision, positive ETCO2 and breath sounds checked- equal and bilateral Secured at: 22 cm Tube secured with: Tape Dental Injury: Teeth and Oropharynx as per pre-operative assessment

## 2021-11-22 NOTE — Progress Notes (Signed)
Mobility Specialist Progress Note    11/22/21 1519  Mobility  Activity Transferred from bed to chair  Level of Assistance Minimal assist, patient does 75% or more  Assistive Device Other (Comment) (HHA)  Distance Ambulated (ft) 2 ft  Activity Response Tolerated well  Mobility Referral Yes  $Mobility charge 1 Mobility   Pre-Mobility: 75 HR, 98% SpO2 Post-Mobility: 80 HR, 110/62 (76) BP, 98% SpO2  Pt received in bed and agreeable. No complaints. Left with call bell in reach.   Hildred Alamin Mobility Specialist  Secure Chat Only

## 2021-11-22 NOTE — Transfer of Care (Signed)
Immediate Anesthesia Transfer of Care Note  Patient: Darryl Ward  Procedure(s) Performed: VIDEO BRONCHOSCOPY WITH INSERTION OF INTERBRONCHIAL VALVE (IBV)  Patient Location: PACU  Anesthesia Type:General  Level of Consciousness: awake, alert , and oriented  Airway & Oxygen Therapy: Patient Spontanous Breathing  Post-op Assessment: Report given to RN and Post -op Vital signs reviewed and stable  Post vital signs: Reviewed and stable  Last Vitals:  Vitals Value Taken Time  BP 117/66 11/22/21 0922  Temp    Pulse 85 11/22/21 0926  Resp 20 11/22/21 0926  SpO2 99 % 11/22/21 0926  Vitals shown include unvalidated device data.  Last Pain:  Vitals:   11/22/21 0332  TempSrc: Oral  PainSc:          Complications: No notable events documented.

## 2021-11-22 NOTE — Brief Op Note (Signed)
11/22/2021  9:32 AM  PATIENT:  Darryl Ward  76 y.o. male  PRE-OPERATIVE DIAGNOSIS:  Persistent Airleak  POST-OPERATIVE DIAGNOSIS:  Persistent Airleak  PROCEDURE:  Procedure(s): VIDEO BRONCHOSCOPY WITH INSERTION OF INTERBRONCHIAL VALVE (IBV) (N/A)  SURGEON:  Surgeon(s) and Role:    * [name], MD - Primary  PHYSICIAN ASSISTANT:   ASSISTANTS: none   ANESTHESIA:   general  EBL:  minimal   BLOOD ADMINISTERED:none  DRAINS:  2 IBV in middle lobe bronchi    LOCAL MEDICATIONS USED:  NONE  SPECIMEN:  No Specimen  DISPOSITION OF SPECIMEN:  N/A  COUNTS:  NO -  TOURNIQUET:  * No tourniquets in log *  DICTATION: .Other Dictation: Dictation Number -  PLAN OF CARE: Admit to inpatient   PATIENT DISPOSITION:  PACU - hemodynamically stable.   Delay start of Pharmacological VTE agent (>24hrs) due to surgical blood loss or risk of bleeding: no

## 2021-11-22 NOTE — Op Note (Signed)
NAME: Pomeroy RECORD NO: [id number] ACCOUNT NO: [id number] DATE OF BIRTH: 1946/01/10 FACILITY: [facility] LOCATION: [facility] PHYSICIAN: Revonda Standard. Roxan Hockey, MD  Operative Report   DATE OF PROCEDURE: 11/22/2021  PREOPERATIVE DIAGNOSIS:  Persistent air leak after lobectomy.  POSTOPERATIVE DIAGNOSIS:  Persistent air leak after lobectomy.  PROCEDURE:  Bronchoscopy and insertion of intrabronchial valves and right middle lobe medial segmental bronchus (7 mm) and lateral segmental bronchus (5 mm).  SURGEON:  Revonda Standard. Roxan Hockey, MD  ASSISTANT:  None.  ANESTHESIA:  General.  FINDINGS:  Air leak resolved with occlusion of right middle lobe bronchus.  No change in air leak with occlusion of superior segmental or basilar segmental bronchi of the lower lobe.  Right upper lobe bronchial stump well healed.  No endobronchial  lesions.  CLINICAL NOTE:  Darryl Ward is a 76 year old gentleman who has undergone a right upper lobectomy and had a persistent air leak.  He was advised to undergo bronchoscopy and endobronchial valve placement.  The indications, risks, benefits, and alternatives  were discussed in detail with the patient.  He did understand there would be a need for a subsequent bronchoscopy to remove the valves at a later date.  He accepted the risks and agreed to proceed.  DESCRIPTION OF PROCEDURE:  Darryl Ward was brought to the operating room on 11/22/2021.  He had induction of general anesthesia and was intubated.  Chest tube was left on suction and there was an air leak present.  Sequential compression devices were  placed on the calves for DVT prophylaxis.  A Bair Hugger was then placed for active warming.  A timeout was performed.  Flexible fiberoptic bronchoscopy was performed via the endotracheal tube.  The right upper lobe bronchial stump was well healed.  There was some edema and inflammation adjacent to it.  The remainder of the tracheobronchial tree  was within normal  limits with no endobronchial lesions seen.  There were some thick clear secretions, which were evacuated with saline.  A Fogarty balloon catheter then was advanced through the bronchoscope and used to occlude the superior segmental  bronchus, the lower lobe basilar segmental bronchi and then the right middle lobe bronchus.  With inflation in the superior segmental bronchus and the basilar segmental bronchi there was no change in the air leak. With a balloon inflation in the middle  lobe bronchus there was immediate decrease in the air leak and then after approximately a minute the air leak resolved completely.  The middle lobe bronchus was relatively large and it was felt that a better result would be obtained by placing the valves  at the origin of the medial and lateral segmental bronchi.  The lateral segmental bronchus sized for a 5 mm valve, which was then advanced into the airway and deployed.  Next, the medial segmental bronchus sized for a 7 mm valve, which likewise was  deployed.  Again, there was an immediate decrease in the air leak and then over several minutes the air leak resolved completely.  The bronchoscope was removed.  The patient was extubated in the operating room and taken to the postanesthetic care unit in good condition.     SUJ D: 11/22/2021 2:58:58 pm T: 11/22/2021 11:58:00 pm  JOB: [id number]/ [id number]

## 2021-11-23 ENCOUNTER — Inpatient Hospital Stay (HOSPITAL_COMMUNITY): Payer: Medicare Other

## 2021-11-23 ENCOUNTER — Encounter (HOSPITAL_COMMUNITY): Payer: Self-pay | Admitting: Thoracic Surgery (Cardiothoracic Vascular Surgery)

## 2021-11-23 NOTE — Anesthesia Postprocedure Evaluation (Signed)
Anesthesia Post Note  Patient: Darryl Ward  Procedure(s) Performed: VIDEO BRONCHOSCOPY WITH INSERTION OF INTERBRONCHIAL VALVE (IBV)     Patient location during evaluation: PACU Anesthesia Type: General Level of consciousness: sedated and patient cooperative Pain management: pain level controlled Vital Signs Assessment: post-procedure vital signs reviewed and stable Respiratory status: spontaneous breathing Cardiovascular status: stable Anesthetic complications: no   No notable events documented.  Last Vitals:  Vitals:   11/22/21 2329 11/23/21 0506  BP: 111/60 112/62  Pulse: 62 73  Resp: 19 19  Temp: 36.4 C (!) 36.4 C  SpO2: 98% 99%    Last Pain:  Vitals:   11/23/21 0506  TempSrc: Oral  PainSc: 0-No pain   Pain Goal:                   [name]

## 2021-11-23 NOTE — TOC Progression Note (Signed)
Transition of Care [facility]) - Progression Note    Patient Details  Name: Darryl Ward MRN: [id number] Date of Birth: [birth year]  Transition of Care [facility] CM/SW Contact  Zenon Mayo, RN Phone Number: 11/23/2021, 4:01 PM  Clinical Narrative:    Patient may go home with a mini express chest tube, NCM offered choice to patient for [facility], he states he is ok with [facility] if they can do it for him.  NCM made referral to [facility] with [facility]. She is able to take referral . Soc will begin 24 to 48 hrs post dc after dc on Monday.  TOC following. Will need HHRN order.    Expected Discharge Plan: [place] Barriers to Discharge: Continued Medical Work up  Expected Discharge Plan and Services Expected Discharge Plan: [place] In-house Referral: NA Discharge Planning Services: CM Consult Post Acute Care Choice: [place] arrangements for the past 2 months: Single Family Home                 DME Arranged: N/A DME Agency: NA       HH Arranged: RN   Date HH Agency Contacted: 11/23/21 Time HH Agency Contacted: 1600 Representative spoke with at [place]: Cyndie   Social Determinants of Health (National) Interventions    Readmission Risk Interventions     No data to display

## 2021-11-23 NOTE — Progress Notes (Signed)
Mobility Specialist Progress Note    11/23/21 0954  Mobility  Activity Ambulated with assistance in hallway  Level of Assistance Minimal assist, patient does 75% or more  Assistive Device Front wheel walker  Distance Ambulated (ft) 160 ft  Activity Response Tolerated well  Mobility Referral Yes  $Mobility charge 1 Mobility   Pre-Mobility: 89 HR, 114/65 (80) BP, 97% SpO2 During Mobility: 125 HR, >/=88% SpO2 Post-Mobility: 96 HR, 94/67 (74) BP, 98% SpO2  Pt received sitting EOB and agreeable. No complaints on walk. Returned to chair with call bell in reach. RN notified of BP.   Hildred Alamin Mobility Specialist  Secure Chat Only

## 2021-11-23 NOTE — Progress Notes (Signed)
1 Day Post-Op Procedure(s) (LRB): VIDEO BRONCHOSCOPY WITH INSERTION OF INTERBRONCHIAL VALVE (IBV) (N/A) Subjective: Feels well, denies pain  Objective: Vital signs in last 24 hours: Temp:  [97.5 F (36.4 C)-98 F (36.7 C)] 97.5 F (36.4 C) (11/03 0506) Pulse Rate:  [62-80] 73 (11/03 0506) Cardiac Rhythm: Normal sinus rhythm (11/03 0700) Resp:  [12-20] 19 (11/03 0506) BP: (106-117)/(60-72) 112/62 (11/03 0506) SpO2:  [96 %-100 %] 99 % (11/03 0506)  Hemodynamic parameters for last 24 hours:    Intake/Output from previous day: 11/02 0701 - 11/03 0700 In: 600 [I.V.:600] Out: 480 [Urine:400; Chest Tube:80] Intake/Output this shift: No intake/output data recorded.  General appearance: alert, cooperative, and no distress Neurologic: intact Heart: regular rate and rhythm Lungs: slightly diminished on right SQ air unchanged, very small air leak  Lab Results: No results for input(s): "WBC", "HGB", "HCT", "PLT" in the last 72 hours. BMET: No results for input(s): "NA", "K", "CL", "CO2", "GLUCOSE", "BUN", "CREATININE", "CALCIUM" in the last 72 hours.  PT/INR: No results for input(s): "LABPROT", "INR" in the last 72 hours. ABG    Component Value Date/Time   PHART 7.44 11/08/2021 1337   HCO3 22.4 11/08/2021 1337   TCO2 26 01/08/2012 1828   ACIDBASEDEF 1.1 11/08/2021 1337   O2SAT 99.7 11/08/2021 1337   CBG (last 3)  No results for input(s): "GLUCAP" in the last 72 hours.  Assessment/Plan: S/P Procedure(s) (LRB): VIDEO BRONCHOSCOPY WITH INSERTION OF INTERBRONCHIAL VALVE (IBV) (N/A) POD # 11 right upper lobectomy, day 1 post IBV Air leak dramatically decreased, CXR stable Will place CT to water seal today Hopefully can dc tomorrow Ambulate Pain well controlled   LOS: 11 days    [name] 11/23/2021

## 2021-11-23 NOTE — Plan of Care (Signed)

## 2021-11-24 ENCOUNTER — Inpatient Hospital Stay (HOSPITAL_COMMUNITY): Payer: Medicare Other

## 2021-11-24 NOTE — Progress Notes (Signed)
Mobility Specialist Progress Note    11/24/21 1527  Mobility  Activity Contraindicated/medical hold   RN advised d/t suction. Will f/u as appropriate.   Hildred Alamin Mobility Specialist  Secure Chat Only

## 2021-11-24 NOTE — Progress Notes (Signed)
2 Days Post-Op Procedure(s) (LRB): VIDEO BRONCHOSCOPY WITH INSERTION OF INTERBRONCHIAL VALVE (IBV) (N/A) Subjective: frustrated  Objective: Vital signs in last 24 hours: Temp:  [97.7 F (36.5 C)-98.4 F (36.9 C)] 98.2 F (36.8 C) (11/04 1056) Pulse Rate:  [76-88] 88 (11/04 1056) Cardiac Rhythm: Normal sinus rhythm (11/04 0727) Resp:  [16-20] 20 (11/04 1056) BP: (104-117)/(51-65) 111/65 (11/04 1056) SpO2:  [94 %-99 %] 98 % (11/04 1056)  Hemodynamic parameters for last 24 hours:    Intake/Output from previous day: 11/03 0701 - 11/04 0700 In: -  Out: 1150 [Urine:1000; Chest Tube:150] Intake/Output this shift: Total I/O In: 240 [P.O.:240] Out: -   General appearance: alert, cooperative, and no distress Neurologic: intact Heart: regular rate and rhythm Lungs: decreased BS on right + small air leak, increased from yesterday  Lab Results: No results for input(s): "WBC", "HGB", "HCT", "PLT" in the last 72 hours. BMET: No results for input(s): "NA", "K", "CL", "CO2", "GLUCOSE", "BUN", "CREATININE", "CALCIUM" in the last 72 hours.  PT/INR: No results for input(s): "LABPROT", "INR" in the last 72 hours. ABG    Component Value Date/Time   PHART 7.44 11/08/2021 1337   HCO3 22.4 11/08/2021 1337   TCO2 26 01/08/2012 1828   ACIDBASEDEF 1.1 11/08/2021 1337   O2SAT 99.7 11/08/2021 1337   CBG (last 3)  No results for input(s): "GLUCAP" in the last 72 hours.  Assessment/Plan: S/P Procedure(s) (LRB): VIDEO BRONCHOSCOPY WITH INSERTION OF INTERBRONCHIAL VALVE (IBV) (N/A) - Persistent air leak His CXR this AM showed larger right pneumothorax- CT placed back to suction Has a small air leak, increased from yesterday, smaller than prior to IBV Will leave on suction today, consider another trial of water seal tomorrow Otherwise doing well   LOS: 12 days    Darryl Ward 11/24/2021

## 2021-11-25 ENCOUNTER — Inpatient Hospital Stay (HOSPITAL_COMMUNITY): Payer: Medicare Other

## 2021-11-25 NOTE — Plan of Care (Signed)
Discussed with patient plan of care for the evening, pain management and using the bedside commode throughout the night with some teach back displayed.  Problem: Education: Goal: Knowledge of disease or condition will improve Outcome: Progressing   Problem: Activity: Goal: Risk for activity intolerance will decrease Outcome: Progressing

## 2021-11-25 NOTE — Progress Notes (Addendum)
      [address]       [place] [address]             [phone]      3 Days Post-Op Procedure(s) (LRB): VIDEO BRONCHOSCOPY WITH INSERTION OF INTERBRONCHIAL VALVE (IBV) (N/A) Subjective:  Awake and alert, reports no change in his breathing, minimal pain from the chest tube.   Objective: Vital signs in last 24 hours: Temp:  [98.2 F (36.8 C)-98.3 F (36.8 C)] 98.3 F (36.8 C) (11/05 0724) Pulse Rate:  [71-161] 161 (11/05 1029) Cardiac Rhythm: Normal sinus rhythm (11/05 0722) Resp:  [14-23] 23 (11/05 1029) BP: (96-122)/(51-75) 102/61 (11/05 0724) SpO2:  [93 %-100 %] 96 % (11/05 1029)     Intake/Output from previous day: 11/04 0701 - 11/05 0700 In: 600 [P.O.:600] Out: 1725 [Urine:1300; Chest Tube:425] Intake/Output this shift: Total I/O In: 180 [P.O.:180] Out: 100 [Urine:100]  General appearance: alert, cooperative, and no distress Neurologic: intact Heart: regular rate and rhythm, had a few episodes of SVT to 160's Lungs: decreased BS on right. Small and constant air leak. SubQ air aobut the same.  CXR showing slightly smaller right PTX compared with yesterday.   Lab Results: No results for input(s): "WBC", "HGB", "HCT", "PLT" in the last 72 hours. BMET: No results for input(s): "NA", "K", "CL", "CO2", "GLUCOSE", "BUN", "CREATININE", "CALCIUM" in the last 72 hours.  PT/INR: No results for input(s): "LABPROT", "INR" in the last 72 hours. ABG    Component Value Date/Time   PHART 7.44 11/08/2021 1337   HCO3 22.4 11/08/2021 1337   TCO2 26 01/08/2012 1828   ACIDBASEDEF 1.1 11/08/2021 1337   O2SAT 99.7 11/08/2021 1337   CLINICAL DATA:  RIGHT UPPER lobectomy.  Follow-up pneumothorax.   EXAM: PORTABLE CHEST 1 VIEW   COMPARISON:  11/24/2021 and prior studies   FINDINGS: The cardiomediastinal silhouette is unchanged.   A moderate RIGHT apical pneumothorax is slightly decreased since the prior study. RIGHT thoracostomy tube and central  RIGHT intrabronchial valves again noted.   A LEFT Port-A-Cath is unchanged with tip overlying the SUPERIOR cavoatrial junction.   Diffuse bilateral subcutaneous emphysema and pneumomediastinum again noted.   There is no evidence of pleural effusion or LEFT pneumothorax.   Minimal RIGHT basilar atelectasis again noted.   No other significant changes are present.   IMPRESSION: 1. Slightly decreased moderate RIGHT apical pneumothorax. 2. No other significant change.     Electronically Signed   By: Margarette Canada M.D.   On: 11/25/2021 09:07  Assessment/Plan: S/P Procedure(s) (LRB): VIDEO BRONCHOSCOPY WITH INSERTION OF INTERBRONCHIAL VALVE (IBV) (N/A)  His CXR this AM showing a smaller but still significant right pneumothorax- CT is on suction.    LOS: 13 days    Antony Odea, PA-C 11/25/2021 Patient seen and examined, agree with above Air leak is minimal at present and markedly improved from yesterday, but does occur with each cough Will leave tube on suction today  Remo Lipps C. Roxan Hockey, MD [facility] [phone]

## 2021-11-26 ENCOUNTER — Inpatient Hospital Stay (HOSPITAL_COMMUNITY): Payer: Medicare Other

## 2021-11-26 NOTE — Plan of Care (Signed)
  Problem: Respiratory: Goal: Respiratory status will improve Outcome: Progressing   Problem: Clinical Measurements: Goal: Respiratory complications will improve Outcome: Progressing Goal: Cardiovascular complication will be avoided Outcome: Progressing   Problem: Activity: Goal: Risk for activity intolerance will decrease Outcome: Progressing   Problem: Pain Managment: Goal: General experience of comfort will improve Outcome: Progressing

## 2021-11-26 NOTE — Progress Notes (Addendum)
      [address]       [place] [address]             [phone]      4 Days Post-Op Procedure(s) (LRB): VIDEO BRONCHOSCOPY WITH INSERTION OF INTERBRONCHIAL VALVE (IBV) (N/A)  Subjective:  Patient without complaints.    Objective: Vital signs in last 24 hours: Temp:  [97.6 F (36.4 C)-99.4 F (37.4 C)] 99.4 F (37.4 C) (11/06 0732) Pulse Rate:  [68-161] 77 (11/06 0732) Cardiac Rhythm: Normal sinus rhythm (11/05 1953) Resp:  [18-23] 20 (11/06 0732) BP: (100-112)/(52-68) 109/63 (11/06 0732) SpO2:  [94 %-100 %] 97 % (11/06 0732)  Intake/Output from previous day: 11/05 0701 - 11/06 0700 In: 780 [P.O.:780] Out: 605 [Urine:375; Chest Tube:230]  General appearance: alert, cooperative, and no distress Heart: regular rate and rhythm Lungs: clear to auscultation bilaterally Abdomen: soft, non-tender; bowel sounds normal; no masses,  no organomegaly Extremities: extremities normal, atraumatic, no cyanosis or edema Wound: clean and dry  Lab Results: No results for input(s): "WBC", "HGB", "HCT", "PLT" in the last 72 hours. BMET: No results for input(s): "NA", "K", "CL", "CO2", "GLUCOSE", "BUN", "CREATININE", "CALCIUM" in the last 72 hours.  PT/INR: No results for input(s): "LABPROT", "INR" in the last 72 hours. ABG    Component Value Date/Time   PHART 7.44 11/08/2021 1337   HCO3 22.4 11/08/2021 1337   TCO2 26 01/08/2012 1828   ACIDBASEDEF 1.1 11/08/2021 1337   O2SAT 99.7 11/08/2021 1337   CBG (last 3)  No results for input(s): "GLUCAP" in the last 72 hours.  Assessment/Plan: S/P Procedure(s) (LRB): VIDEO BRONCHOSCOPY WITH INSERTION OF INTERBRONCHIAL VALVE (IBV) (N/A)  S/P IBV placement, CT remains on suction with air leak-- unfortunately CXR was not ordered, will review once completed  CXR completed- pneumothorax appears slightly improved, vs. Stable.. suspect will leave chest tube on suction   LOS: 14 days    Ellwood Handler,  PA-C 11/26/2021  Patient seen and examined, agree with above CXR shows a slight decrease in space Will try on water seal again today, if remains stable can dc with CT to miniXpress Needs to ambulate  Remo Lipps C. Roxan Hockey, MD [facility] [phone]

## 2021-11-27 ENCOUNTER — Inpatient Hospital Stay (HOSPITAL_COMMUNITY): Payer: Medicare Other

## 2021-11-27 NOTE — Progress Notes (Signed)
      [address]       [place] [address]             [phone]      5 Days Post-Op Procedure(s) (LRB): VIDEO BRONCHOSCOPY WITH INSERTION OF INTERBRONCHIAL VALVE (IBV) (N/A)  Subjective:  No new complaints.  Wants to go home.  Objective: Vital signs in last 24 hours: Temp:  [98.1 F (36.7 C)-98.6 F (37 C)] 98.3 F (36.8 C) (11/07 0726) Pulse Rate:  [60-73] 73 (11/07 0726) Cardiac Rhythm: Normal sinus rhythm (11/06 2000) Resp:  [15-19] 18 (11/07 0726) BP: (96-109)/(52-64) 106/59 (11/07 0726) SpO2:  [96 %-100 %] 98 % (11/07 0726)  Intake/Output from previous day: 11/06 0701 - 11/07 0700 In: 1 [P.O.:960] Out: 620 [Urine:600; Chest Tube:20]  General appearance: alert, cooperative, and no distress Heart: regular rate and rhythm Lungs: diminished breath sounds diminished on right Abdomen: soft, non-tender; bowel sounds normal; no masses,  no organomegaly Extremities: extremities normal, atraumatic, no cyanosis or edema Wound: clean and dry  Lab Results: No results for input(s): "WBC", "HGB", "HCT", "PLT" in the last 72 hours. BMET: No results for input(s): "NA", "K", "CL", "CO2", "GLUCOSE", "BUN", "CREATININE", "CALCIUM" in the last 72 hours.  PT/INR: No results for input(s): "LABPROT", "INR" in the last 72 hours. ABG    Component Value Date/Time   PHART 7.44 11/08/2021 1337   HCO3 22.4 11/08/2021 1337   TCO2 26 01/08/2012 1828   ACIDBASEDEF 1.1 11/08/2021 1337   O2SAT 99.7 11/08/2021 1337   CBG (last 3)  No results for input(s): "GLUCAP" in the last 72 hours.  Assessment/Plan: S/P Procedure(s) (LRB): VIDEO BRONCHOSCOPY WITH INSERTION OF INTERBRONCHIAL VALVE (IBV) (N/A)  S/P IBV placement, CT placed on water seal yesterday.. CXR shows slight increase in pneumothorax...trying to transition patient to Mini Express, will discuss with Dr. Roxan Hockey   LOS: 15 days    Ellwood Handler, PA-C 11/27/2021

## 2021-11-27 NOTE — Progress Notes (Signed)
Mobility Specialist Progress Note    11/27/21 0942  Mobility  Activity Ambulated with assistance in hallway  Level of Assistance Contact guard assist, steadying assist  Assistive Device Front wheel walker  Distance Ambulated (ft) 200 ft  Activity Response Tolerated well  Mobility Referral Yes  $Mobility charge 1 Mobility   During Mobility: 120 HR, 90% SpO2 Post-Mobility: 91 HR, 101/69 (81) BP, 96% SpO2  Pt received in bed requesting to use BR. Had void. No complaints. Had x1 LOB requiring modA to recover. Returned to sitting EOB with call bell in reach.   Hildred Alamin Mobility Specialist  Secure Chat Only

## 2021-11-27 NOTE — Progress Notes (Signed)
Mobility Specialist Progress Note    11/27/21 1542  Mobility  Activity Ambulated with assistance in hallway  Level of Assistance Contact guard assist, steadying assist  Assistive Device Front wheel walker  Distance Ambulated (ft) 200 ft  Activity Response Tolerated well  Mobility Referral Yes  $Mobility charge 1 Mobility   Pre-Mobility: 81 HR, 99% SpO2 During Mobility: 108 HR Post-Mobility: 75 HR, 94% SpO2  Pt received sitting EOB and agreeable. No complaints on walk. Returned to sitting EOB with call bell in reach.    Hildred Alamin Mobility Specialist  Secure Chat Only

## 2021-11-28 ENCOUNTER — Inpatient Hospital Stay (HOSPITAL_COMMUNITY): Payer: Medicare Other

## 2021-11-28 MED ORDER — TRAMADOL HCL 50 MG PO TABS: 50.0000 mg | ORAL_TABLET | Freq: Four times a day (QID) | ORAL | 0 refills | Status: DC | PRN

## 2021-11-28 MED ORDER — ACETAMINOPHEN 500 MG PO TABS: 1000.0000 mg | ORAL_TABLET | Freq: Four times a day (QID) | ORAL | 0 refills | Status: DC | PRN

## 2021-11-28 NOTE — Plan of Care (Signed)
  Problem: Respiratory: Goal: Respiratory status will improve Outcome: Progressing   Problem: Pain Management: Goal: Pain level will decrease Outcome: Progressing   Problem: Skin Integrity: Goal: Wound healing without signs and symptoms infection will improve Outcome: Progressing   Problem: Clinical Measurements: Goal: Cardiovascular complication will be avoided Outcome: Progressing   Problem: Activity: Goal: Risk for activity intolerance will decrease Outcome: Progressing   Problem: Coping: Goal: Level of anxiety will decrease Outcome: Progressing   Problem: Elimination: Goal: Will not experience complications related to urinary retention Outcome: Progressing   Problem: Nutrition: Goal: Adequate nutrition will be maintained Outcome: Not Progressing   Problem: Elimination: Goal: Will not experience complications related to bowel motility Outcome: Not Progressing

## 2021-11-28 NOTE — Progress Notes (Signed)
Mobility Specialist Progress Note    11/28/21 0930  Mobility  Activity Ambulated with assistance in hallway  Level of Assistance Contact guard assist, steadying assist  Assistive Device Front wheel walker  Distance Ambulated (ft) 175 ft  Activity Response Tolerated well  $Mobility charge 1 Mobility   Pre-Mobility: 64 HR, 104/68 (80) BP, 97% SpO2  Pt received in bed and agreeable. No complaints on walk. Returned to sitting EOB with call bell in reach.    Hildred Alamin Mobility Specialist  Secure Chat Only

## 2021-11-28 NOTE — Progress Notes (Addendum)
      [address]       [place] [address]             [phone]       6 Days Post-Op Procedure(s) (LRB): VIDEO BRONCHOSCOPY WITH INSERTION OF INTERBRONCHIAL VALVE (IBV) (N/A)  Subjective:  Patient is doing well.  No new complaints this morning.  Hoping to go home today.  Objective: Vital signs in last 24 hours: Temp:  [97.8 F (36.6 C)-98.4 F (36.9 C)] 98.3 F (36.8 C) (11/08 0324) Pulse Rate:  [63-83] 69 (11/08 0324) Cardiac Rhythm: Normal sinus rhythm (11/07 1922) Resp:  [15-20] 15 (11/08 0324) BP: (97-128)/(58-71) 97/58 (11/08 0324) SpO2:  [97 %-100 %] 97 % (11/08 0324)  Intake/Output from previous day: 11/07 0701 - 11/08 0700 In: 240 [P.O.:240] Out: 1050 [Urine:1000; Chest Tube:50]  General appearance: alert, cooperative, and no distress Heart: regular rate and rhythm Lungs: diminished breath sounds on right Abdomen: soft, non-tender; bowel sounds normal; no masses,  no organomegaly Extremities: extremities normal, atraumatic, no cyanosis or edema Wound: clean and dry  Lab Results: No results for input(s): "WBC", "HGB", "HCT", "PLT" in the last 72 hours. BMET: No results for input(s): "NA", "K", "CL", "CO2", "GLUCOSE", "BUN", "CREATININE", "CALCIUM" in the last 72 hours.  PT/INR: No results for input(s): "LABPROT", "INR" in the last 72 hours. ABG    Component Value Date/Time   PHART 7.44 11/08/2021 1337   HCO3 22.4 11/08/2021 1337   TCO2 26 01/08/2012 1828   ACIDBASEDEF 1.1 11/08/2021 1337   O2SAT 99.7 11/08/2021 1337   CBG (last 3)  No results for input(s): "GLUCAP" in the last 72 hours.  Assessment/Plan: S/P Procedure(s) (LRB): VIDEO BRONCHOSCOPY WITH INSERTION OF INTERBRONCHIAL VALVE (IBV) (N/A)  CT converted to Mini Express yesterday, CXR shows stable appearance of pneumothorax.  I did not see a leak this morning, sub q emphysema appears stable.  Dispo- suspect d/c home today, will await Dr. Leonarda Salon evaluation   LOS:  16 days    Ellwood Handler, PA-C 11/28/2021 Patient seen and examined, agree with above No air leak but given course I am not comfortable removing CT CXR shows decreased pneumo/ space Will plan to DC with CT in place Follow up in office next week  Remo Lipps C. Roxan Hockey, MD [facility] [phone]  Will plan

## 2021-11-28 NOTE — Progress Notes (Signed)
RN changed pt's dressing, went over d/c summary w/ pt, and gave pt instructions on how to remove drainage from mini-express w/ syringe. Belongings w/ pt. NT removed PIV. Pt going to d/c lounge to wait for his friend to pic him up.

## 2021-11-28 NOTE — TOC Transition Note (Signed)
Transition of Care [facility]) - CM/SW Discharge Note   Patient Details  Name: LOEL BETANCUR MRN: [id number] Date of Birth: 02-07-1945  [facility]) CM/SW Contact:  Zenon Mayo, RN Phone Number: 11/28/2021, 10:00 AM   Clinical Narrative:    Patient is for dc today, NCM notified Tommi Rumps with [place].  Patient  has been educated on how to drain the mini chest tube express per Staff RN Ladell Pier.  Patient states his friend Coralyn Mark will be transporting him home at 10:30 today.    Final next level of care: [place] Barriers to Discharge: Continued Medical Work up   Patient Goals and CMS Choice Patient states their goals for this hospitalization and ongoing recovery are:: return home CMS Medicare.gov Compare Post Acute Care list provided to:: Patient Choice offered to / list presented to : Patient  Discharge Placement                       Discharge Plan and Services In-house Referral: NA Discharge Planning Services: CM Consult Post Acute Care Choice: Home Health          DME Arranged: N/A DME Agency: NA       HH Arranged: RN   Date HH Agency Contacted: 11/23/21 Time HH Agency Contacted: 1600 Representative spoke with at [place]: Cyndie  Social Determinants of Health ([place]) Interventions     Readmission Risk Interventions     No data to display

## 2021-11-29 ENCOUNTER — Other Ambulatory Visit: Payer: Self-pay | Admitting: *Deleted

## 2021-11-29 ENCOUNTER — Telehealth: Payer: Self-pay

## 2021-11-29 ENCOUNTER — Ambulatory Visit: Payer: Medicare Other | Admitting: Cardiology

## 2021-11-29 NOTE — Patient Outreach (Signed)
  Care Coordination TOC Note Transition Care Management Follow-up Telephone Call Date of discharge and from where[facility] How have you been since you were released from the hospital? Per [name], patients emergency contact, patient is sleeping a lot but he is getting around with walker and eating very well.  His chest tube has small amount of drainage. Any questions or concerns? No  Items Reviewed: Did the pt receive and understand the discharge instructions provided? Yes  Medications obtained and verified? Yes  Other? No  Any new allergies since your discharge? No  Dietary orders reviewed? Yes Do you have support at home? Yes   Home Care and Equipment/Supplies: Were home health services ordered? yes If so, what is the name of the agency? [facility]  Has the agency set up a time to come to the patient's home? no Were any new equipment or medical supplies ordered?  No What is the name of the medical supply agency? N/A Were you able to get the supplies/equipment? not applicable Do you have any questions related to the use of the equipment or supplies? No  Functional Questionnaire: (I = Independent and D = Dependent) ADLs: I  Bathing/Dressing- I  Meal Prep- I  Eating- I  Maintaining continence- I  Transferring/Ambulation- I  Managing Meds- I  Follow up appointments reviewed:  PCP Hospital f/u appt confirmed? No   Specialist Hospital f/u appt confirmed? Yes  Scheduled to see Dr. Roxan Hockey on 12/04/21 @ 11:45. Are transportation arrangements needed? No  If their condition worsens, is the pt aware to call PCP or go to the Emergency Dept.? Yes Was the patient provided with contact information for the PCP's office or ED? Yes Was to pt encouraged to call back with questions or concerns? Yes  SDOH assessments and interventions completed:   Yes  Care Coordination Interventions Activated:  Yes   Care Coordination Interventions:  Contact [facility] and verify they are  coming to see patient    Encounter Outcome:  Pt. Visit Completed

## 2021-11-29 NOTE — Patient Outreach (Signed)
  Care Coordination   Follow Up Visit Note   11/29/2021 Name: TAYVEN RENTERIA MRN: [id number] DOB: [birth year]  Kelby Aline Frangos is a 76 y.o. year old male who sees Georgina Quint, Hessie Diener, NP for primary care. I spoke with  [name], patients emergency contact by phone today.  [facility] health and verified they have an RN planned to visit the patient.  Per Randell Patient, they have been trying to reach patient to schedule a time to see patient.  This Probation officer called [name] back to let her know and provided the phone number for Alvis Lemmings [phone]) and ask for Whitney to set up time to visit patient.  What matters to the patients health and wellness today?  Home Health Services    Goals Addressed   None     SDOH assessments and interventions completed:  Yes  SDOH Interventions Today    Flowsheet Row Most Recent Value  SDOH Interventions   Food Insecurity Interventions Intervention Not Indicated  Utilities Interventions Intervention Not Indicated        Care Coordination Interventions Activated:  Yes  Care Coordination Interventions:  Yes, provided   Follow up plan: No further intervention required.   Encounter Outcome:  Pt. Visit Completed

## 2021-11-29 NOTE — Progress Notes (Signed)
The proposed treatment discussed in conference is for discussion purpose only and is not a binding recommendation.  The patients have not been physically examined, or presented with their treatment options.  Therefore, final treatment plans cannot be decided.  

## 2021-12-01 DIAGNOSIS — E785 Hyperlipidemia, unspecified: Secondary | ICD-10-CM | POA: Diagnosis not present

## 2021-12-01 DIAGNOSIS — Z9181 History of falling: Secondary | ICD-10-CM | POA: Diagnosis not present

## 2021-12-01 DIAGNOSIS — I700 Atherosclerosis of aorta: Secondary | ICD-10-CM | POA: Diagnosis not present

## 2021-12-01 DIAGNOSIS — C3411 Malignant neoplasm of upper lobe, right bronchus or lung: Secondary | ICD-10-CM | POA: Diagnosis not present

## 2021-12-01 DIAGNOSIS — D631 Anemia in chronic kidney disease: Secondary | ICD-10-CM | POA: Diagnosis not present

## 2021-12-01 DIAGNOSIS — D696 Thrombocytopenia, unspecified: Secondary | ICD-10-CM | POA: Diagnosis not present

## 2021-12-01 DIAGNOSIS — Z902 Acquired absence of lung [part of]: Secondary | ICD-10-CM | POA: Diagnosis not present

## 2021-12-01 DIAGNOSIS — I110 Hypertensive heart disease with heart failure: Secondary | ICD-10-CM | POA: Diagnosis not present

## 2021-12-01 DIAGNOSIS — Z7901 Long term (current) use of anticoagulants: Secondary | ICD-10-CM | POA: Diagnosis not present

## 2021-12-01 DIAGNOSIS — J9811 Atelectasis: Secondary | ICD-10-CM | POA: Diagnosis not present

## 2021-12-01 DIAGNOSIS — Z483 Aftercare following surgery for neoplasm: Secondary | ICD-10-CM | POA: Diagnosis not present

## 2021-12-01 DIAGNOSIS — J449 Chronic obstructive pulmonary disease, unspecified: Secondary | ICD-10-CM | POA: Diagnosis not present

## 2021-12-01 DIAGNOSIS — I4891 Unspecified atrial fibrillation: Secondary | ICD-10-CM | POA: Diagnosis not present

## 2021-12-01 DIAGNOSIS — I509 Heart failure, unspecified: Secondary | ICD-10-CM | POA: Diagnosis not present

## 2021-12-03 ENCOUNTER — Other Ambulatory Visit: Payer: Self-pay | Admitting: Thoracic Surgery (Cardiothoracic Vascular Surgery)

## 2021-12-03 DIAGNOSIS — C3411 Malignant neoplasm of upper lobe, right bronchus or lung: Secondary | ICD-10-CM

## 2021-12-04 ENCOUNTER — Ambulatory Visit
Admission: RE | Admit: 2021-12-04 | Discharge: 2021-12-04 | Disposition: A | Discharge status: Home / Self Care | Payer: Medicare Other | Source: Ambulatory Visit | Attending: Thoracic Surgery (Cardiothoracic Vascular Surgery) | Admitting: Thoracic Surgery (Cardiothoracic Vascular Surgery)

## 2021-12-04 ENCOUNTER — Emergency Department (HOSPITAL_COMMUNITY)
Admission: EM | Admit: 2021-12-04 | Discharge: 2021-12-04 | Disposition: A | Discharge status: Home / Self Care | Payer: Medicare Other | Attending: Emergency Medicine | Admitting: Emergency Medicine

## 2021-12-04 ENCOUNTER — Ambulatory Visit: Payer: Self-pay | Admitting: Thoracic Surgery (Cardiothoracic Vascular Surgery)

## 2021-12-04 ENCOUNTER — Encounter (HOSPITAL_COMMUNITY): Payer: Self-pay

## 2021-12-04 ENCOUNTER — Emergency Department (HOSPITAL_COMMUNITY): Payer: Medicare Other

## 2021-12-04 ENCOUNTER — Other Ambulatory Visit: Payer: Self-pay

## 2021-12-04 DIAGNOSIS — J90 Pleural effusion, not elsewhere classified: Secondary | ICD-10-CM | POA: Insufficient documentation

## 2021-12-04 DIAGNOSIS — I4891 Unspecified atrial fibrillation: Secondary | ICD-10-CM | POA: Diagnosis not present

## 2021-12-04 DIAGNOSIS — J939 Pneumothorax, unspecified: Secondary | ICD-10-CM | POA: Diagnosis not present

## 2021-12-04 DIAGNOSIS — J449 Chronic obstructive pulmonary disease, unspecified: Secondary | ICD-10-CM | POA: Diagnosis not present

## 2021-12-04 DIAGNOSIS — R410 Disorientation, unspecified: Secondary | ICD-10-CM | POA: Diagnosis present

## 2021-12-04 DIAGNOSIS — R944 Abnormal results of kidney function studies: Secondary | ICD-10-CM | POA: Insufficient documentation

## 2021-12-04 DIAGNOSIS — Z79899 Other long term (current) drug therapy: Secondary | ICD-10-CM | POA: Insufficient documentation

## 2021-12-04 DIAGNOSIS — E860 Dehydration: Secondary | ICD-10-CM | POA: Insufficient documentation

## 2021-12-04 DIAGNOSIS — D72829 Elevated white blood cell count, unspecified: Secondary | ICD-10-CM | POA: Insufficient documentation

## 2021-12-04 DIAGNOSIS — J9811 Atelectasis: Secondary | ICD-10-CM | POA: Diagnosis not present

## 2021-12-04 DIAGNOSIS — R0602 Shortness of breath: Secondary | ICD-10-CM | POA: Diagnosis not present

## 2021-12-04 DIAGNOSIS — J439 Emphysema, unspecified: Secondary | ICD-10-CM | POA: Diagnosis not present

## 2021-12-04 DIAGNOSIS — I959 Hypotension, unspecified: Secondary | ICD-10-CM | POA: Insufficient documentation

## 2021-12-04 DIAGNOSIS — R55 Syncope and collapse: Secondary | ICD-10-CM | POA: Diagnosis not present

## 2021-12-04 DIAGNOSIS — Z7901 Long term (current) use of anticoagulants: Secondary | ICD-10-CM | POA: Insufficient documentation

## 2021-12-04 DIAGNOSIS — C3411 Malignant neoplasm of upper lobe, right bronchus or lung: Secondary | ICD-10-CM

## 2021-12-04 LAB — CBC WITH DIFFERENTIAL/PLATELET
Abs Immature Granulocytes: 0.06 10*3/uL (ref 0.00–0.07)
Abs Immature Granulocytes: 0.05 10*3/uL (ref 0.00–0.07)
Basophils Absolute: 0.1 10*3/uL (ref 0.0–0.1)
Basophils Absolute: 0.1 10*3/uL (ref 0.0–0.1)
Basophils Relative: 1 %
Basophils Relative: 1 %
Eosinophils Absolute: 0.1 10*3/uL (ref 0.0–0.5)
Eosinophils Absolute: 0.2 10*3/uL (ref 0.0–0.5)
Eosinophils Relative: 1 %
Eosinophils Relative: 1 %
HCT: 33.5 % — ABNORMAL LOW (ref 39.0–52.0)
HCT: 32.8 % — ABNORMAL LOW (ref 39.0–52.0)
Hemoglobin: 10.7 g/dL — ABNORMAL LOW (ref 13.0–17.0)
Hemoglobin: 10.9 g/dL — ABNORMAL LOW (ref 13.0–17.0)
Immature Granulocytes: 0 %
Immature Granulocytes: 1 %
Lymphocytes Relative: 5 %
Lymphocytes Relative: 15 %
Lymphs Abs: 0.8 10*3/uL (ref 0.7–4.0)
Lymphs Abs: 1.7 10*3/uL (ref 0.7–4.0)
MCH: 33.5 pg (ref 26.0–34.0)
MCH: 33.4 pg (ref 26.0–34.0)
MCHC: 31.9 g/dL (ref 30.0–36.0)
MCHC: 33.2 g/dL (ref 30.0–36.0)
MCV: 105 fL — ABNORMAL HIGH (ref 80.0–100.0)
MCV: 100.6 fL — ABNORMAL HIGH (ref 80.0–100.0)
Monocytes Absolute: 0.7 10*3/uL (ref 0.1–1.0)
Monocytes Absolute: 0.9 10*3/uL (ref 0.1–1.0)
Monocytes Relative: 5 %
Monocytes Relative: 8 %
Neutro Abs: 12.3 10*3/uL — ABNORMAL HIGH (ref 1.7–7.7)
Neutro Abs: 8.3 10*3/uL — ABNORMAL HIGH (ref 1.7–7.7)
Neutrophils Relative %: 88 %
Neutrophils Relative %: 74 %
Platelets: 204 10*3/uL (ref 150–400)
Platelets: 201 10*3/uL (ref 150–400)
RBC: 3.19 MIL/uL — ABNORMAL LOW (ref 4.22–5.81)
RBC: 3.26 MIL/uL — ABNORMAL LOW (ref 4.22–5.81)
RDW: 15.1 % (ref 11.5–15.5)
RDW: 15.1 % (ref 11.5–15.5)
WBC: 14 10*3/uL — ABNORMAL HIGH (ref 4.0–10.5)
WBC: 11.1 10*3/uL — ABNORMAL HIGH (ref 4.0–10.5)
nRBC: 0 % (ref 0.0–0.2)
nRBC: 0 % (ref 0.0–0.2)

## 2021-12-04 LAB — COMPREHENSIVE METABOLIC PANEL
ALT: 11 U/L (ref 0–44)
AST: 20 U/L (ref 15–41)
Albumin: 2.4 g/dL — ABNORMAL LOW (ref 3.5–5.0)
Alkaline Phosphatase: 49 U/L (ref 38–126)
Anion gap: 16 — ABNORMAL HIGH (ref 5–15)
BUN: 20 mg/dL (ref 8–23)
CO2: 24 mmol/L (ref 22–32)
Calcium: 9.1 mg/dL (ref 8.9–10.3)
Chloride: 95 mmol/L — ABNORMAL LOW (ref 98–111)
Creatinine, Ser: 1.38 mg/dL — ABNORMAL HIGH (ref 0.61–1.24)
GFR, Estimated: 54 mL/min — ABNORMAL LOW (ref >60)
Glucose, Bld: 109 mg/dL — ABNORMAL HIGH (ref 70–99)
Potassium: 3.8 mmol/L (ref 3.5–5.1)
Sodium: 135 mmol/L (ref 135–145)
Total Bilirubin: 0.5 mg/dL (ref 0.3–1.2)
Total Protein: 7.2 g/dL (ref 6.5–8.1)

## 2021-12-04 LAB — BRAIN NATRIURETIC PEPTIDE: B Natriuretic Peptide: 62.8 pg/mL (ref 0.0–100.0)

## 2021-12-04 LAB — TROPONIN I (HIGH SENSITIVITY)
Troponin I (High Sensitivity): 14 ng/L (ref <18)
Troponin I (High Sensitivity): 11 ng/L (ref <18)

## 2021-12-04 MED ORDER — SODIUM CHLORIDE 0.9 % IV BOLUS
500.0000 mL | Freq: Once | INTRAVENOUS | Status: AC
  Administered 2021-12-04: 500 mL via INTRAVENOUS

## 2021-12-04 NOTE — ED Notes (Signed)
The patient and family were inquiring on the how much longer they would have to wait. The family stated the patient was becoming agitated and was on the verge of wanting to leave without being seen.

## 2021-12-04 NOTE — ED Notes (Signed)
ED Provider at bedside. 

## 2021-12-04 NOTE — Discharge Instructions (Signed)
Your pneumothorax is stable.  Your lab is consistent with mild dehydration.  Please stay hydrated.  The chest tube is in good position.  CT surgery should be contacting you regarding follow-up  Return to ER if you have worse dizziness, lethargy, confusion

## 2021-12-04 NOTE — ED Triage Notes (Signed)
Pt arrives via [facility] from [place] for near syncopal episode. EMS initial VP 80/60, improved to 100/60 without intervention. Pt has R chest tube in place from recent lobectomy r/t lung cancer.

## 2021-12-04 NOTE — Progress Notes (Signed)
Pt brought over to nursing area to obtain vital signs due to pt "not feeling well" during chest xray at [facility]. Xray tech reported the patient became very light headed, dusky in color, and reported he was nauseas after standing for xray. Vitals obtained. Dr. Jeralyn Ruths notified of pt in nursing area and assessed him. The pts spouse at his side. EMS was called due to pts BP and pts spouse stating he has progressively gotten worse since being discharged form the hospital last week. A chest tube was noted with sanguineous drainage. Pt was taken to the ER via ambulance.

## 2021-12-04 NOTE — ED Notes (Signed)
X-ray at bedside

## 2021-12-04 NOTE — ED Provider Triage Note (Signed)
Emergency Medicine Provider Triage Evaluation Note  Darryl Ward , Ward 76 y.o. male  was evaluated in triage.  Pt complains of syncopal episode status post chest tube placement 1 week ago. Was brought in by EMS due to Ward near syncopal episode. Denies chest pain, shortness of breath, abdominal pain, nausea, vomiting, dizziness, lightheadedness.    Review of Systems  Positive:  Negative:   Physical Exam  BP (!) 96/52 (BP Location: Left Arm)   Pulse (!) 51   Temp 97.9 F (36.6 C)   Resp 18   Ht 5\' 10"  (1.778 m)   Wt 72.6 kg   SpO2 100%   BMI 22.96 kg/m  Gen:   Awake, no distress   Resp:  Normal effort  MSK:   Moves extremities without difficulty  Other:  Chest tube in place without purulent drainage noted to the area. No chest wall TTP.   Medical Decision Making  Medically screening exam initiated at 11:44 AM.  Appropriate orders placed.  Darryl Ward was informed that the remainder of the evaluation will be completed by another provider, this initial triage assessment does not replace that evaluation, and the importance of remaining in the ED until their evaluation is complete.     Darryl Mclin A, PA-C 12/04/21 1152

## 2021-12-04 NOTE — ED Provider Notes (Signed)
[facility] Provider Note   CSN: [id number] Arrival date & time: 12/04/21  1119     History  Chief Complaint  Patient presents with   Near Syncope    Darryl Ward is a 76 y.o. male hx of afib on eliquis, here presenting with confusion and hypotension.  Patient woke up slightly confused today.  Patient had an outpatient chest x-ray ordered to follow-up on the chest tube and went there and was noted to have a blood pressure in the 70s.  He was also tachycardic at that time.  Patient was sent to the ER.  While in triage, patient had a CBC that showed a white count of 14.  Patient's blood pressure improved while in triage.  Denies any fever.  Denies any abdominal pain or vomiting.  Patient had right partial lobectomy with chest tube placement on 10/23 by Dr. Roxan Hockey.  Patient had a pneumothorax after the procedure that is persistent.  Per the wife, nothing drained out since yesterday.  The history is provided by the patient.       Home Medications Prior to Admission medications   Medication Sig Start Date End Date Taking? Authorizing Provider  acetaminophen (TYLENOL) 500 MG tablet Take 2 tablets (1,000 mg total) by mouth every 6 (six) hours as needed. 11/28/21   Barrett, Erin R, PA-C  apixaban (ELIQUIS) 5 MG TABS tablet Take 1 tablet (5 mg total) by mouth 2 (two) times daily. 10/17/21   Derek Jack, MD  atorvastatin (LIPITOR) 80 MG tablet Take 80 mg by mouth daily.    [provider]  cyclobenzaprine (FLEXERIL) 10 MG tablet Take 10 mg by mouth 2 (two) times daily as needed for muscle spasms. 07/09/21   [provider]  folic acid (FOLVITE) 1 MG tablet Take 1 tablet (1 mg total) by mouth daily. 10/17/21   Derek Jack, MD  furosemide (LASIX) 20 MG tablet Take 1 tablet (20 mg total) by mouth daily as needed. 10/17/21   Derek Jack, MD  metoprolol succinate (TOPROL-XL) 50 MG 24 hr tablet Take 1 tablet (50 mg  total) by mouth daily. 10/17/21   Derek Jack, MD  traMADol (ULTRAM) 50 MG tablet Take 1 tablet (50 mg total) by mouth every 6 (six) hours as needed (mild pain). 11/28/21   Barrett, Lodema Hong, PA-C      Allergies    Patient has no known allergies.    Review of Systems   Review of Systems  Neurological:  Positive for dizziness.  All other systems reviewed and are negative.   Physical Exam Updated Vital Signs BP 107/65   Pulse 92   Temp 98 F (36.7 C) (Oral)   Resp 18   Ht 5\' 10"  (1.778 m)   Wt 72.6 kg   SpO2 100%   BMI 22.96 kg/m  Physical Exam Vitals and nursing note reviewed.  HENT:     Head: Normocephalic.     Nose: Nose normal.     Mouth/Throat:     Mouth: Mucous membranes are moist.  Eyes:     Extraocular Movements: Extraocular movements intact.     Pupils: Pupils are equal, round, and reactive to light.  Cardiovascular:     Rate and Rhythm: Normal rate and regular rhythm.  Pulmonary:     Comments: Crackles R base. Chest tube in place with minimal drainage  Abdominal:     General: Abdomen is flat.     Palpations: Abdomen is soft.  Musculoskeletal:  General: Normal range of motion.     Cervical back: Normal range of motion and neck supple.  Skin:    General: Skin is warm.     Capillary Refill: Capillary refill takes less than 2 seconds.  Neurological:     General: No focal deficit present.     Mental Status: He is alert and oriented to person, place, and time.  Psychiatric:        Mood and Affect: Mood normal.        Behavior: Behavior normal.     ED Results / Procedures / Treatments   Labs (all labs ordered are listed, but only abnormal results are displayed) Labs Reviewed  CBC WITH DIFFERENTIAL/PLATELET - Abnormal; Notable for the following components:      Result Value   WBC 14.0 (*)    RBC 3.19 (*)    Hemoglobin 10.7 (*)    HCT 33.5 (*)    MCV 105.0 (*)    Neutro Abs 12.3 (*)    All other components within normal limits  CBC WITH  DIFFERENTIAL/PLATELET  COMPREHENSIVE METABOLIC PANEL  BRAIN NATRIURETIC PEPTIDE  CBG MONITORING, ED  TROPONIN I (HIGH SENSITIVITY)    EKG None  Radiology DG Chest 2 View  Result Date: 12/04/2021 CLINICAL DATA:  Lung cancer, surgery last week, nausea, shortness of breath, weakness, hypotension EXAM: CHEST - 2 VIEW COMPARISON:  11/28/2021 FINDINGS: LEFT jugular Port-A-Cath with tip projecting over SVC. Endobronchial valves noted RIGHT lung. Normal heart size, mediastinal contours, and pulmonary vascularity. Atherosclerotic calcification aorta. Emphysematous changes with postsurgical volume loss of the RIGHT hemithorax. Increased atelectasis RIGHT base. Persistent moderate RIGHT pneumothorax despite thoracostomy tube. LEFT lung clear. Osseous demineralization. Chest wall emphysema bilaterally endocervical regions. IMPRESSION: COPD changes with postsurgical changes RIGHT hemithorax. Persistent RIGHT pneumothorax despite thoracostomy tube. Increased RIGHT basilar atelectasis. Aortic Atherosclerosis (ICD10-I70.0) and Emphysema (ICD10-J43.9). Electronically Signed   By: Lavonia Dana M.D.   On: 12/04/2021 10:45    Procedures Procedures    Medications Ordered in ED Medications - No data to display  ED Course/ Medical Decision Making/ A&P                           Medical Decision Making Darryl Ward is a 76 y.o. male here presenting with hypotension.  Suspect vasovagal syncope.  Patient has a known pneumothorax after chest tube placement.  Plan to get CBC and CMP and repeat a chest x-ray.  10:55 PM Repeat chest x-ray showed stable pneumothorax.  White blood cell count was 14 earlier and decreased to 11 and hemoglobin remained stable around 11.  Chemistry showed creatinine of 1.4 which is slightly elevated.  Anion gap of 16.  This is consistent with dehydration.  I discussed case with Dr. Kerry Fort from cardiothoracic surgery.  He knew about the patient ahead of time.  He states that since the  pneumothorax is stable, there is no intervention required currently.  Patient was given IV fluids and felt better.  Blood pressure improved.  Stable for discharge.   Problems Addressed: Dehydration: acute illness or injury Hypotension, unspecified hypotension type: acute illness or injury Pneumothorax, unspecified type: acute illness or injury  Amount and/or Complexity of Data Reviewed Labs: ordered. Decision-making details documented in ED Course. Radiology: ordered and independent interpretation performed. Decision-making details documented in ED Course.    Final Clinical Impression(s) / ED Diagnoses Final diagnoses:  None    Rx / DC Orders ED Discharge Orders  None         Drenda Freeze, MD 12/04/21 [id number]

## 2021-12-05 ENCOUNTER — Other Ambulatory Visit: Payer: Self-pay | Admitting: *Deleted

## 2021-12-05 DIAGNOSIS — Z902 Acquired absence of lung [part of]: Secondary | ICD-10-CM

## 2021-12-06 ENCOUNTER — Telehealth: Payer: Self-pay

## 2021-12-06 NOTE — Telephone Encounter (Signed)
[facility] contacted the office concerned about patient's appearance and symptoms. States that patient was very lethargic, gray looking, and was unable to stand very long before feeling as if he was going to pass out. Vitals were taken, patient's blood pressure was 80/50. O2 sats/ hr was normal. Advised that patient should be evaluated in ED as symptoms were concerning. Patient is s/p  RATS RULobectomy with Dr. Roxan Hockey 11/2 and was also sent home with a mini express chest tube.   Patient's daughter also contacted the office a couple hours later concerned for her father's health and being in the ED. She states that she is not comfortable with him waiting in the ED with all he has been through, weak, and afraid that he may get sick from sitting in there. Advised that he really needed to stay for further evaluation. Dr. Roxan Hockey aware.

## 2021-12-10 ENCOUNTER — Telehealth: Payer: Self-pay

## 2021-12-10 NOTE — Telephone Encounter (Signed)
Sarah, RN with [facility] health contacted the office to state patient's family observed purulent drainage and redness around patient's chest tube site. She states that she did see the patient in the home today and chest tube insertion site looked well, no drainage and minimal redness that has remained at the site.

## 2021-12-11 ENCOUNTER — Ambulatory Visit (INDEPENDENT_AMBULATORY_CARE_PROVIDER_SITE_OTHER): Payer: Self-pay | Admitting: Thoracic Surgery (Cardiothoracic Vascular Surgery)

## 2021-12-11 ENCOUNTER — Ambulatory Visit
Admission: RE | Admit: 2021-12-11 | Discharge: 2021-12-11 | Disposition: A | Discharge status: Home / Self Care | Payer: Medicare Other | Source: Ambulatory Visit | Attending: Thoracic Surgery (Cardiothoracic Vascular Surgery) | Admitting: Thoracic Surgery (Cardiothoracic Vascular Surgery)

## 2021-12-11 VITALS — BP 97/48 | HR 67 | Resp 20 | Ht 70.0 in | Wt 152.0 lb

## 2021-12-11 DIAGNOSIS — Z902 Acquired absence of lung [part of]: Secondary | ICD-10-CM

## 2021-12-11 DIAGNOSIS — C3411 Malignant neoplasm of upper lobe, right bronchus or lung: Secondary | ICD-10-CM

## 2021-12-11 DIAGNOSIS — R079 Chest pain, unspecified: Secondary | ICD-10-CM | POA: Diagnosis not present

## 2021-12-11 NOTE — Progress Notes (Signed)
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  He was found to have a right upper lobe lung nodule on a low-dose for CT screening.  He had clinical stage IIb (T1, N1) disease.  Dr. Lamonte Sakai did bronchoscopy which showed adenocarcinoma.  He was treated with neoadjuvant chemoimmunotherapy and then underwent a robotic assisted right upper lobectomy.  That was complicated by prolonged air leak.  I placed endobronchial valves on 11/22/2021.  His airleak improved and then finally resolved just prior to discharge but given the situation was felt best to send him home with tube in place on 11/28/2021.  He went to the ED on 12/04/2021 with confusion and hypotension.  He went to have a chest x-ray done but was felt to be pale and noted to be hypotensive.  He was sent to the emergency room.  His chest x-ray was stable.  He was dehydrated.  He was given IV fluids and his symptoms improved and he was discharged home.  He still continues to have a relatively poor appetite.  Has developed a hoarse voice.  Has had some mild serous drainage from the chest tube but no respiratory issues.  Is not requiring any narcotics for pain.  Past Medical History:  Diagnosis Date   AAA (abdominal aortic aneurysm) (HCC)    Arthritis    Carotid artery disease ([place])    Nonobstructive   Cataract    Coronary atherosclerosis of native coronary artery    PTCA small diagonal 2007 otherwise nonobstructive CAD   Essential hypertension, benign    Hyperlipidemia    NSTEMI (non-ST elevated myocardial infarction) ([place]) 2007   Stroke [facility]) 2004   TIA (transient ischemic  attack) 2006    Current Outpatient Medications  Medication Sig Dispense Refill   acetaminophen (TYLENOL) 500 MG tablet Take 2 tablets (1,000 mg total) by mouth every 6 (six) hours as needed. (Patient taking differently: Take 1,000 mg by mouth 2 (two) times daily as needed for moderate pain.) 30 tablet 0   apixaban (ELIQUIS) 5 MG TABS tablet Take 1 tablet (5 mg total) by mouth 2 (two) times daily. 180 tablet 1   atorvastatin (LIPITOR) 80 MG tablet Take 80 mg by mouth daily.     cyclobenzaprine (FLEXERIL) 10 MG tablet Take 10 mg by mouth 2 (two) times daily as needed for muscle spasms.     folic acid (FOLVITE) 1 MG tablet Take 1 tablet (1 mg total) by mouth daily. 15 tablet 0   furosemide (LASIX) 20 MG tablet Take 1 tablet (20 mg total) by mouth daily as needed. (Patient taking differently: Take 20 mg by mouth daily as needed for edema (for lower extremities).) 90 tablet 2   metoprolol succinate (TOPROL-XL) 50 MG 24 hr tablet Take 1 tablet (50 mg total) by mouth daily. 90 tablet 1   traMADol (ULTRAM) 50 MG tablet Take 1 tablet (50 mg total) by mouth every 6 (six) hours as needed (mild pain). 30 tablet 0   No current facility-administered medications for this visit.    Physical Exam BP (!) 97/48 (BP  Location: Left Arm, Patient Position: Sitting)   Pulse 67   Resp 20   Ht 5\' 10"  (1.778 m)   Wt 152 lb (68.9 kg)   SpO2 99% Comment: RA  BMI 21.31 kg/m  76 year old man in no acute distress, chronically ill-appearing Alert and oriented x3 Incisions clean dry and intact, erythema around chest tube site Serous fluid in tubing, no air leak Lungs diminished on right otherwise clear  Diagnostic Tests: CHEST - 2 VIEW   COMPARISON:  Previous studies including the examination done on 12/04/2021   FINDINGS: Cardiac size is within normal limits. There are no signs of pulmonary edema. There is partial resection of right lung. There is moderate sized right apical pneumothorax with interval  decrease. There is a small air-fluid level in the right apical region suggesting loculated hydropneumothorax. There is blunting of right lateral CP angle. Left lateral CP angle is clear. Linear densities in the medial right lower lung field may suggest scarring or subsegmental atelectasis. Single right chest tube is noted with its tip in the lateral aspect of right apex. Tip of left IJ central venous catheter is seen in superior vena cava. There is decrease in subcutaneous emphysema in the right chest wall.   IMPRESSION: There is interval decrease in right pneumothorax. There is residual right apical pneumothorax which may be loculated. There is interval decrease in subcutaneous emphysema in the right chest wall. Single right chest tube is seen with its tip overlying the right apex.     Electronically Signed   By: Elmer Picker M.D.   On: 12/11/2021 16:00 I personally reviewed the chest x-ray images.  Decrease in the right apical space.  Decrease obtains emphysema.  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  He was diagnosed with a clinical stage IIb adenocarcinoma earlier this year.  He had neoadjuvant chemoimmunotherapy and then underwent a robotic assisted right upper lobectomy on 11/12/2021.  Complicated by prolonged air leak.  Pathology showed downgrading to ypT1,ypN0, stage Ia adenocarcinoma.  Persistent air leak-status post IBV placement.  I do not see a leak from his chest tube today.  Unfortunately it is very late in the day.  Given his medical issues I would not be comfortable pulling at chest tube at this hour.  I will have him come back to the office tomorrow morning at 9 AM to reassess and if no leak we will DC the tube at that  time.  Doing well from pain standpoint.  Blood pressure remains relatively low- p.o. intake remains limited.  Encouraged to take fluids and eat as much as possible.  Plan: Return at 9 AM tomorrow He will ultimately need repeat bronchoscopy for IBV removal.  [name], MD [facility] [phone]

## 2021-12-11 NOTE — Progress Notes (Signed)
Given that the lines

## 2021-12-12 ENCOUNTER — Ambulatory Visit
Admission: RE | Admit: 2021-12-12 | Discharge: 2021-12-12 | Disposition: A | Discharge status: Home / Self Care | Payer: Medicare Other | Source: Ambulatory Visit | Attending: Thoracic Surgery (Cardiothoracic Vascular Surgery) | Admitting: Thoracic Surgery (Cardiothoracic Vascular Surgery)

## 2021-12-12 ENCOUNTER — Ambulatory Visit (INDEPENDENT_AMBULATORY_CARE_PROVIDER_SITE_OTHER): Payer: Medicare Other | Admitting: Thoracic Surgery (Cardiothoracic Vascular Surgery)

## 2021-12-12 ENCOUNTER — Other Ambulatory Visit: Payer: Self-pay | Admitting: *Deleted

## 2021-12-12 ENCOUNTER — Other Ambulatory Visit: Payer: Self-pay | Admitting: Thoracic Surgery (Cardiothoracic Vascular Surgery)

## 2021-12-12 VITALS — BP 107/59 | HR 81 | Resp 18 | Ht 70.0 in

## 2021-12-12 DIAGNOSIS — Z902 Acquired absence of lung [part of]: Secondary | ICD-10-CM

## 2021-12-12 DIAGNOSIS — C3411 Malignant neoplasm of upper lobe, right bronchus or lung: Secondary | ICD-10-CM

## 2021-12-12 DIAGNOSIS — J982 Interstitial emphysema: Secondary | ICD-10-CM | POA: Diagnosis not present

## 2021-12-12 DIAGNOSIS — M19012 Primary osteoarthritis, left shoulder: Secondary | ICD-10-CM | POA: Diagnosis not present

## 2021-12-12 DIAGNOSIS — M19011 Primary osteoarthritis, right shoulder: Secondary | ICD-10-CM | POA: Diagnosis not present

## 2021-12-12 DIAGNOSIS — Z4682 Encounter for fitting and adjustment of non-vascular catheter: Secondary | ICD-10-CM | POA: Diagnosis not present

## 2021-12-12 NOTE — Progress Notes (Signed)
      [address]       [place], [address]             [phone]      Mr. Springborn returns today for chest tube removal.  No air leak  Mild erythema and some exudate at chest tube site  Chest tube removed without difficulty.  Occlusive dressing left in place.  We will send for chest x-ray today  Follow-up in 2 weeks with PA lateral chest x-ray  Remo Lipps C. Roxan Hockey, MD [facility] [phone]

## 2021-12-12 NOTE — Progress Notes (Signed)
Per Dr. Roxan Hockey, orders for shower chair and elevated toilet seat with handles faxed to [facility].

## 2021-12-13 ENCOUNTER — Other Ambulatory Visit: Payer: Self-pay

## 2021-12-17 ENCOUNTER — Inpatient Hospital Stay: Payer: Medicare Other | Attending: Hematology | Admitting: Hematology

## 2021-12-17 ENCOUNTER — Inpatient Hospital Stay: Payer: Medicare Other

## 2021-12-17 VITALS — BP 108/65 | HR 50 | Temp 97.0°F | Resp 20 | Ht 69.29 in | Wt 150.4 lb

## 2021-12-17 DIAGNOSIS — I480 Paroxysmal atrial fibrillation: Secondary | ICD-10-CM | POA: Insufficient documentation

## 2021-12-17 DIAGNOSIS — F17210 Nicotine dependence, cigarettes, uncomplicated: Secondary | ICD-10-CM | POA: Insufficient documentation

## 2021-12-17 DIAGNOSIS — I10 Essential (primary) hypertension: Secondary | ICD-10-CM | POA: Insufficient documentation

## 2021-12-17 DIAGNOSIS — Z801 Family history of malignant neoplasm of trachea, bronchus and lung: Secondary | ICD-10-CM | POA: Diagnosis not present

## 2021-12-17 DIAGNOSIS — Z8673 Personal history of transient ischemic attack (TIA), and cerebral infarction without residual deficits: Secondary | ICD-10-CM | POA: Diagnosis not present

## 2021-12-17 DIAGNOSIS — Z902 Acquired absence of lung [part of]: Secondary | ICD-10-CM | POA: Diagnosis not present

## 2021-12-17 DIAGNOSIS — C3411 Malignant neoplasm of upper lobe, right bronchus or lung: Secondary | ICD-10-CM | POA: Diagnosis not present

## 2021-12-17 DIAGNOSIS — F32A Depression, unspecified: Secondary | ICD-10-CM | POA: Diagnosis not present

## 2021-12-17 LAB — CBC WITH DIFFERENTIAL/PLATELET
Abs Immature Granulocytes: 0.05 10*3/uL (ref 0.00–0.07)
Basophils Absolute: 0 10*3/uL (ref 0.0–0.1)
Basophils Relative: 0 %
Eosinophils Absolute: 0.3 10*3/uL (ref 0.0–0.5)
Eosinophils Relative: 3 %
HCT: 29.7 % — ABNORMAL LOW (ref 39.0–52.0)
Hemoglobin: 9.6 g/dL — ABNORMAL LOW (ref 13.0–17.0)
Immature Granulocytes: 1 %
Lymphocytes Relative: 15 %
Lymphs Abs: 1.3 10*3/uL (ref 0.7–4.0)
MCH: 32.1 pg (ref 26.0–34.0)
MCHC: 32.3 g/dL (ref 30.0–36.0)
MCV: 99.3 fL (ref 80.0–100.0)
Monocytes Absolute: 0.6 10*3/uL (ref 0.1–1.0)
Monocytes Relative: 7 %
Neutro Abs: 6.6 10*3/uL (ref 1.7–7.7)
Neutrophils Relative %: 74 %
Platelets: 220 10*3/uL (ref 150–400)
RBC: 2.99 MIL/uL — ABNORMAL LOW (ref 4.22–5.81)
RDW: 15 % (ref 11.5–15.5)
WBC: 8.9 10*3/uL (ref 4.0–10.5)
nRBC: 0 % (ref 0.0–0.2)

## 2021-12-17 LAB — COMPREHENSIVE METABOLIC PANEL
ALT: 14 U/L (ref 0–44)
AST: 22 U/L (ref 15–41)
Albumin: 2.5 g/dL — ABNORMAL LOW (ref 3.5–5.0)
Alkaline Phosphatase: 63 U/L (ref 38–126)
Anion gap: 12 (ref 5–15)
BUN: 13 mg/dL (ref 8–23)
CO2: 23 mmol/L (ref 22–32)
Calcium: 8.8 mg/dL — ABNORMAL LOW (ref 8.9–10.3)
Chloride: 102 mmol/L (ref 98–111)
Creatinine, Ser: 0.89 mg/dL (ref 0.61–1.24)
GFR, Estimated: >60 mL/min (ref >60)
Glucose, Bld: 95 mg/dL (ref 70–99)
Potassium: 3.8 mmol/L (ref 3.5–5.1)
Sodium: 137 mmol/L (ref 135–145)
Total Bilirubin: 0.5 mg/dL (ref 0.3–1.2)
Total Protein: 7.1 g/dL (ref 6.5–8.1)

## 2021-12-17 LAB — MAGNESIUM: Magnesium: 1.6 mg/dL — ABNORMAL LOW (ref 1.7–2.4)

## 2021-12-17 MED ORDER — ESCITALOPRAM OXALATE 10 MG PO TABS
10.0000 mg | ORAL_TABLET | Freq: Every day | ORAL | 3 refills | Status: DC
  Filled 2022-02-08: qty 30, 30d supply, fill #0
  Filled 2022-03-09: qty 30, 30d supply, fill #1

## 2021-12-17 NOTE — Patient Instructions (Addendum)
[facility]  Discharge Instructions  You were seen and examined today by Dr. Delton Coombes.  Dr. Delton Coombes has reviewed the reports of your surgery. Research shows that patients who follow chemotherapy and surgery with a year of immunotherapy have overall better survival rates.   Dr. Delton Coombes has recommended [name]. This is given once every 3 weeks here at the [facility]. However, we will wait for your appointment with Cardiology to decide.  For your depression, Dr. Delton Coombes has recommended Lexapro 10mg  to be taken once daily.  Follow-up as scheduled.  Thank you for choosing [facility] to provide your oncology and hematology care.   To afford each patient quality time with our provider, please arrive at least 15 minutes before your scheduled appointment time. You may need to reschedule your appointment if you arrive late (10 or more minutes). Arriving late affects you and other patients whose appointments are after yours.  Also, if you miss three or more appointments without notifying the office, you may be dismissed from the clinic at the provider's discretion.    Again, thank you for choosing [facility].  Our hope is that these requests will decrease the amount of time that you wait before being seen by our physicians.   If you have a lab appointment with the [facility] please come in thru the Main Entrance and check in at the main information desk.           _____________________________________________________________  Should you have questions after your visit to [facility], please contact our office at [phone] and follow the prompts.  Our office hours are 8:00 a.m. to 4:30 p.m. Monday - Thursday and 8:00 a.m. to 2:30 p.m. Friday.  Please note that voicemails left after 4:00 p.m. may not be returned until the following business day.  We are closed weekends and all major holidays.  You do  have access to a nurse 24-7, just call the main number to the clinic [phone] and do not press any options, hold on the line and a nurse will answer the phone.    For prescription refill requests, have your pharmacy contact our office and allow 72 hours.    Masks are optional in the cancer centers. If you would like for your care team to wear a mask while they are taking care of you, please let them know. You may have one support person who is at least 74 years old accompany you for your appointments.

## 2021-12-17 NOTE — Progress Notes (Signed)
[place], [place] [address]   CLINIC:  Medical Oncology/Hematology  PCP:  [name], NP [name] 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  PRIOR THERAPY: none  NGS Results: not done  CURRENT THERAPY: Neoadjuvant chemoimmunotherapy  BRIEF ONCOLOGIC HISTORY:  Oncology History  Primary adenocarcinoma of upper lobe of right lung ([place])  06/26/2021 Initial Diagnosis   Primary adenocarcinoma of upper lobe of right lung ([place])   08/06/2021 - 08/27/2021 Chemotherapy   Patient is on Treatment Plan : LUNG NSCLC Pemetrexed + Carboplatin q21d x 4 Cycles     08/06/2021 -  Chemotherapy   Patient is on Treatment Plan : LUNG NSCLC Pemetrexed + Carboplatin q21d x 4 Cycles       CANCER STAGING:  Cancer Staging  Primary adenocarcinoma of upper lobe of right lung ([place]) Staging form: Lung, AJCC 8th Edition - Clinical stage from 06/26/2021: cT1, cN1, cM0 - Unsigned   INTERVAL HISTORY:  Mr. PETROS AHART, a 76 y.o. male, seen for follow-up of right lung cancer.  He underwent resection of the lung cancer on 11/12/2021.  His eating is gradually improving.  He is currently eating 2 meals per day for the last 1 week.  REVIEW OF SYSTEMS:  Review of Systems  Constitutional:  Positive for fatigue.  Psychiatric/Behavioral:  Positive for depression.   All other systems reviewed and are negative.   PAST MEDICAL/SURGICAL HISTORY:  Past Medical History:  Diagnosis Date   AAA (abdominal aortic aneurysm) (HCC)    Arthritis    Carotid artery disease ([place])    Nonobstructive   Cataract    Coronary atherosclerosis of native coronary artery    PTCA small diagonal 2007 otherwise nonobstructive CAD   Essential hypertension, benign    Hyperlipidemia    NSTEMI (non-ST elevated myocardial infarction) ([place]) 2007   Stroke [facility]) 2004   TIA (transient ischemic attack) 2006   Past  Surgical History:  Procedure Laterality Date   AORTA - BILATERAL FEMORAL ARTERY BYPASS GRAFT  01/08/2012   Procedure: AORTA BIFEMORAL BYPASS GRAFT;  Surgeon: Elam Dutch, MD;  [facility]: [facility];  Service: Vascular;  Laterality: Bilateral;  using 18x86m x 40cm Hemashield Gold Vascular Graft with Endarterectomy, Thombectomy and  Reimplantation of Inferior Mesenteric Artery   BRONCHIAL BIOPSY  06/11/2021   Procedure: BRONCHIAL BIOPSIES;  Surgeon: BCollene Gobble MD;  [facility] ENDOS[facility];  Service: Pulmonary;;   BRONCHIAL BRUSHINGS  06/11/2021   Procedure: BRONCHIAL BRUSHINGS;  Surgeon: BCollene Gobble MD;  [place]  Service: Pulmonary;;   BRONCHIAL NEEDLE ASPIRATION BIOPSY  06/11/2021   Procedure: BRONCHIAL NEEDLE ASPIRATION BIOPSIES;  Surgeon: BCollene Gobble MD;  [facility];  Service: Pulmonary;;   CARDIOVERSION N/A 09/20/2021   Procedure: CARDIOVERSION;  Surgeon: MSatira Sark MD;  Location: [facility] ORS;  Service: Cardiovascular;  Laterality: N/A;   COLONOSCOPY N/A 04/14/2019   Procedure: COLONOSCOPY;  Surgeon: RDaneil Dolin MD;  Location: [facility] ENDO SUITE;  Service: Endoscopy;  Laterality: N/A;  9:30   COLONOSCOPY WITH PROPOFOL N/A 07/25/2021   Procedure: COLONOSCOPY WITH PROPOFOL;  Surgeon: CEloise Harman DO;  Location[facility] END[facility];  Service: Endoscopy;  Laterality: N/A;  1:00pm   FIDUCIAL MARKER PLACEMENT  06/11/2021   Procedure: FIDUCIAL MARKER PLACEMENT;  Surgeon: BCollene Gobble MD;  [facility];  Service: Pulmonary;;   INTERCOSTAL NERVE  BLOCK Right 11/12/2021   Procedure: INTERCOSTAL NERVE BLOCK;  Surgeon: [name], MD;  Location: [place];  Service: Thoracic;  Laterality: Right;   IR IMAGING GUIDED PORT INSERTION  07/31/2021   Left cataract surgery     LYMPH NODE DISSECTION Right 11/12/2021   Procedure: LYMPH NODE DISSECTION;  Surgeon: [name], MD;  Location: [place];  Service: Thoracic;  Laterality: Right;    POLYPECTOMY  04/14/2019   Procedure: POLYPECTOMY;  Surgeon: Daneil Dolin, MD;  [facility];  Service: Endoscopy;;   POLYPECTOMY  07/25/2021   Procedure: POLYPECTOMY;  Surgeon: Eloise Harman, DO;  [facility];  Service: Endoscopy;;   TEE WITHOUT CARDIOVERSION N/A 09/20/2021   Procedure: TRANSESOPHAGEAL ECHOCARDIOGRAM (TEE);  Surgeon: Satira Sark, MD;  [facility];  Service: Cardiovascular;  Laterality: N/A;   TRANSFORAMINAL LUMBAR INTERBODY FUSION (TLIF) WITH PEDICLE SCREW FIXATION 1 LEVEL N/A 04/27/2020   Procedure: Transforaminal Lumbar Interbody Fusion Lumbar Five-Sacral One;  Surgeon: Vallarie Mare, MD;  Location: [place];  Service: Neurosurgery;  Laterality: N/A;   VIDEO BRONCHOSCOPY WITH INSERTION OF INTERBRONCHIAL VALVE (IBV) N/A 11/22/2021   Procedure: VIDEO BRONCHOSCOPY WITH INSERTION OF INTERBRONCHIAL VALVE (IBV);  Surgeon: [name], MD;  [facility];  Service: Thoracic;  Laterality: N/A;   VIDEO BRONCHOSCOPY WITH RADIAL ENDOBRONCHIAL ULTRASOUND  06/11/2021   Procedure: VIDEO BRONCHOSCOPY WITH RADIAL ENDOBRONCHIAL ULTRASOUND;  Surgeon: [name], MD;  [facility];  Service: Pulmonary;;    SOCIAL HISTORY:  Social History   Socioeconomic History   Marital status: Divorced    Spouse name: Not on file   Number of children: 1   Years of education: 11   Highest education level: 11th grade  Occupational History    Employer: Probation officer  Tobacco Use   Smoking status: Former    Packs/day: 1.00    Years: 40.00    Total pack years: 40.00    Types: Cigarettes    Quit date: 07/2021    Years since quitting: 0.4   Smokeless tobacco: Never   Tobacco comments:    1 pack of cigarettes smoked daily. 07/17/21 ARJ, RN   Vaping Use   Vaping Use: Never used  Substance and Sexual Activity   Alcohol use: No    Comment: Prior history of regular alcohol use   Drug use: No   Sexual activity: Not Currently  Other  Topics Concern   Not on file  Social History Narrative   Not on file   Social Determinants of Health   Financial Resource Strain: Low Risk  (11/29/2019)   Overall Financial Resource Strain (CARDIA)    Difficulty of Paying Living Expenses: Not hard at all  Food Insecurity: No Food Insecurity (11/29/2021)   Hunger Vital Sign    Worried About Running Out of Food in the Last Year: Never true    Ran Out of Food in the Last Year: Never true  Transportation Needs: No Transportation Needs (11/29/2021)   PRAPARE - Hydrologist (Medical): No    Lack of Transportation (Non-Medical): No  Physical Activity: Inactive (11/29/2019)   Exercise Vital Sign    Days of Exercise per Week: 0 days    Minutes of Exercise per Session: 0 min  Stress: No Stress Concern Present (11/29/2019)   [place]    Feeling of Stress : Only a little  Social Connections: Moderately Isolated (  11/29/2019)   Social Connection and Isolation Panel [NHANES]    Frequency of Communication with Friends and Family: More than three times a week    Frequency of Social Gatherings with Friends and Family: More than three times a week    Attends Religious Services: 1 to 4 times per year    Active Member of [employer] or Organizations: No    Attends Archivist Meetings: Never    Marital Status: Divorced  Human resources officer Violence: Not At Risk (11/13/2021)   Humiliation, Afraid, Rape, and Kick questionnaire    Fear of Current or Ex-Partner: No    Emotionally Abused: No    Physically Abused: No    Sexually Abused: No    FAMILY HISTORY:  Family History  Problem Relation Age of Onset   Hyperlipidemia Sister    Heart attack Brother 36   Cancer - Colon Neg Hx     CURRENT MEDICATIONS:  Current Outpatient Medications  Medication Sig Dispense Refill   acetaminophen (TYLENOL) 500 MG tablet Take 2 tablets (1,000 mg total) by mouth every 6  (six) hours as needed. (Patient taking differently: Take 1,000 mg by mouth 2 (two) times daily as needed for moderate pain.) 30 tablet 0   apixaban (ELIQUIS) 5 MG TABS tablet Take 1 tablet (5 mg total) by mouth 2 (two) times daily. 180 tablet 1   atorvastatin (LIPITOR) 80 MG tablet Take 80 mg by mouth daily.     cyclobenzaprine (FLEXERIL) 10 MG tablet Take 10 mg by mouth 2 (two) times daily as needed for muscle spasms.     escitalopram (LEXAPRO) 10 MG tablet Take 1 tablet (10 mg total) by mouth daily. 30 tablet 3   folic acid (FOLVITE) 1 MG tablet Take 1 tablet (1 mg total) by mouth daily. 15 tablet 0   furosemide (LASIX) 20 MG tablet Take 1 tablet (20 mg total) by mouth daily as needed. (Patient taking differently: Take 20 mg by mouth daily as needed for edema (for lower extremities).) 90 tablet 2   metoprolol succinate (TOPROL-XL) 50 MG 24 hr tablet Take 1 tablet (50 mg total) by mouth daily. 90 tablet 1   traMADol (ULTRAM) 50 MG tablet Take 1 tablet (50 mg total) by mouth every 6 (six) hours as needed (mild pain). 30 tablet 0   No current facility-administered medications for this visit.    ALLERGIES:  No Known Allergies  PHYSICAL EXAM:  Performance status (ECOG): 1 - Symptomatic but completely ambulatory  Vitals:   12/17/21 1511  BP: 108/65  Pulse: (!) 50  Resp: 20  Temp: (!) 97 F (36.1 C)  SpO2: 100%    Wt Readings from Last 3 Encounters:  12/17/21 150 lb 5.7 oz (68.2 kg)  12/11/21 152 lb (68.9 kg)  12/04/21 160 lb (72.6 kg)   Physical Exam Vitals reviewed.  Constitutional:      Appearance: Normal appearance.  Cardiovascular:     Rate and Rhythm: Normal rate and regular rhythm.     Pulses: Normal pulses.     Heart sounds: Normal heart sounds.  Pulmonary:     Effort: Pulmonary effort is normal.     Breath sounds: Normal breath sounds.  Neurological:     General: No focal deficit present.     Mental Status: He is alert and oriented to person, place, and time.   Psychiatric:        Mood and Affect: Mood normal.        Behavior: Behavior normal.  LABORATORY DATA:  I have reviewed the labs as listed.     Latest Ref Rng & Units 12/17/2021    2:08 PM 12/04/2021    8:52 PM 12/04/2021   12:07 PM  CBC  WBC 4.0 - 10.5 K/uL 8.9  11.1  14.0   Hemoglobin 13.0 - 17.0 g/dL 9.6  10.9  10.7   Hematocrit 39.0 - 52.0 % 29.7  32.8  33.5   Platelets 150 - 400 K/uL 220  201  204       Latest Ref Rng & Units 12/17/2021    2:08 PM 12/04/2021    8:52 PM 11/16/2021   12:21 AM  CMP  Glucose 70 - 99 mg/dL 95  109  85   BUN 8 - 23 mg/dL _0 Creatinine 0.61 - 1.24 mg/dL 0.89  1.38  1.05   Sodium 135 - 145 mmol/L 137  135  138   Potassium 3.5 - 5.1 mmol/L 3.8  3.8  4.6   Chloride 98 - 111 mmol/L 102  95  111   CO2 22 - 32 mmol/L _1 Calcium 8.9 - 10.3 mg/dL 8.8  9.1  8.4   Total Protein 6.5 - 8.1 g/dL 7.1  7.2    Total Bilirubin 0.3 - 1.2 mg/dL 0.5  0.5    Alkaline Phos 38 - 126 U/L 63  49    AST 15 - 41 U/L 22  20    ALT 0 - 44 U/L 14  11      DIAGNOSTIC IMAGING:  I have independently reviewed the scans and discussed with the patient. DG Chest 2 View  Result Date: 12/12/2021 CLINICAL DATA:  Chest tube removal EXAM: CHEST - 2 VIEW COMPARISON:  CXR 12/11/21 FINDINGS: Interval removal of the right-sided thoracostomy tube. There is a persistent likely loculated right apical pneumothorax, possibly with a small amount of fluid. This is unchanged compared to recent prior chest radiograph. Left-sided chest port in place with unchanged positioning. No evidence of left-sided pleural effusion. No left pneumothorax. There is persistent subcutaneous emphysema along the chest wall bilaterally. Vertebral body heights are maintained. Degenerative changes of the bilateral AC joints. The upper abdomen is poorly visualized. IMPRESSION: Interval removal of the right-sided thoracostomy tube with persistent likely loculated right apical pneumothorax,  possibly with a small amount of fluid. This is unchanged compared to recent prior chest radiograph. Electronically Signed   By: Marin Roberts M.D.   On: 12/12/2021 10:07   DG Chest 2 View  Result Date: 12/11/2021 CLINICAL DATA:  Status post partial removal of right lung EXAM: CHEST - 2 VIEW COMPARISON:  Previous studies including the examination done on 12/04/2021 FINDINGS: Cardiac size is within normal limits. There are no signs of pulmonary edema. There is partial resection of right lung. There is moderate sized right apical pneumothorax with interval decrease. There is a small air-fluid level in the right apical region suggesting loculated hydropneumothorax. There is blunting of right lateral CP angle. Left lateral CP angle is clear. Linear densities in the medial right lower lung field may suggest scarring or subsegmental atelectasis. Single right chest tube is noted with its tip in the lateral aspect of right apex. Tip of left IJ central venous catheter is seen in superior vena cava. There is decrease in subcutaneous emphysema in the right chest wall. IMPRESSION: There is interval decrease in right pneumothorax. There is residual right apical pneumothorax which may be loculated.  There is interval decrease in subcutaneous emphysema in the right chest wall. Single right chest tube is seen with its tip overlying the right apex. Electronically Signed   By: Elmer Picker M.D.   On: 12/11/2021 16:00   DG Chest Port 1 View  Result Date: 12/04/2021 CLINICAL DATA:  Shortness of breath. EXAM: PORTABLE CHEST 1 VIEW COMPARISON:  Same day. FINDINGS: Stable cardiomediastinal silhouette. Left internal jugular Port-A-Cath is unchanged in position. Stable moderate size right pneumothorax is noted. Stable position of right-sided chest tube. Subcutaneous emphysema seen bilaterally. Small right pleural effusion is noted with associated atelectasis. Bony thorax is unremarkable. IMPRESSION: Stable right sided  pneumothorax is noted. Right-sided chest tube is unchanged in position. Small right pleural effusion is noted. Stable bilateral subcutaneous emphysema. Electronically Signed   By: Marijo Conception M.D.   On: 12/04/2021 21:19   DG Chest 2 View  Result Date: 12/04/2021 CLINICAL DATA:  Lung cancer, surgery last week, nausea, shortness of breath, weakness, hypotension EXAM: CHEST - 2 VIEW COMPARISON:  11/28/2021 FINDINGS: LEFT jugular Port-A-Cath with tip projecting over SVC. Endobronchial valves noted RIGHT lung. Normal heart size, mediastinal contours, and pulmonary vascularity. Atherosclerotic calcification aorta. Emphysematous changes with postsurgical volume loss of the RIGHT hemithorax. Increased atelectasis RIGHT base. Persistent moderate RIGHT pneumothorax despite thoracostomy tube. LEFT lung clear. Osseous demineralization. Chest wall emphysema bilaterally endocervical regions. IMPRESSION: COPD changes with postsurgical changes RIGHT hemithorax. Persistent RIGHT pneumothorax despite thoracostomy tube. Increased RIGHT basilar atelectasis. Aortic Atherosclerosis (ICD10-I70.0) and Emphysema (ICD10-J43.9). Electronically Signed   By: Lavonia Dana M.D.   On: 12/04/2021 10:45   DG Chest Port 1 View  Result Date: 11/28/2021 CLINICAL DATA:  Follow-up lobectomy EXAM: PORTABLE CHEST 1 VIEW COMPARISON:  11/27/2021 FINDINGS: Widespread subcutaneous emphysema. The left lung is clear. Previous lobectomy on the right. Right chest tube remains in place. Pleural air on the right is slightly diminished. Question slight increase in pulmonary density at the right lung base. Power port is unchanged with its tip at the SVC RA junction. IMPRESSION: Right chest tube remains in place. Pleural air on the right is slightly diminished. Question slight increase in pulmonary density at the right lung base. Electronically Signed   By: Nelson Chimes M.D.   On: 11/28/2021 08:43   DG CHEST PORT 1 VIEW  Result Date: 11/27/2021 CLINICAL  DATA:  Pneumothorax, to water seal all night EXAM: PORTABLE CHEST 1 VIEW COMPARISON:  Portable exam 11/26/2021 FINDINGS: RIGHT thoracostomy tube stable. RIGHT jugular line tip projects over SVC. Increase in RIGHT pneumothorax without mediastinal shift. Normal heart size, mediastinal contours, and pulmonary vascularity. Atherosclerotic calcification aorta. Chronic interstitial changes without acute infiltrate or pleural effusion. Extensive chest wall emphysema bilaterally extending into the cervical regions. IMPRESSION: Mild increase in RIGHT pneumothorax with tube reportedly on water seal. Underlying chronic interstitial lung disease. Aortic Atherosclerosis (ICD10-I70.0). Electronically Signed   By: Lavonia Dana M.D.   On: 11/27/2021 09:58   DG CHEST PORT 1 VIEW  Result Date: 11/26/2021 CLINICAL DATA:  Pneumothorax. EXAM: PORTABLE CHEST 1 VIEW COMPARISON:  November 25, 2021. FINDINGS: Stable cardiomediastinal silhouette. Right-sided chest tube is unchanged. Mild right apical pneumothorax is noted which is decreased compared to prior exam. Stable extensive bilateral subcutaneous emphysema is noted. Stable pneumomediastinum is noted. Bony thorax is unremarkable. IMPRESSION: Stable right-sided chest tube is noted. Decreased right apical pneumothorax is noted compared to prior exam. Stable extensive bilateral subcutaneous emphysema. Stable pneumomediastinum. Electronically Signed   By: Bobbe Medico.D.  On: 11/26/2021 08:24   DG Chest Port 1 View  Result Date: 11/25/2021 CLINICAL DATA:  RIGHT UPPER lobectomy.  Follow-up pneumothorax. EXAM: PORTABLE CHEST 1 VIEW COMPARISON:  11/24/2021 and prior studies FINDINGS: The cardiomediastinal silhouette is unchanged. A moderate RIGHT apical pneumothorax is slightly decreased since the prior study. RIGHT thoracostomy tube and central RIGHT intrabronchial valves again noted. A LEFT Port-A-Cath is unchanged with tip overlying the SUPERIOR cavoatrial junction. Diffuse  bilateral subcutaneous emphysema and pneumomediastinum again noted. There is no evidence of pleural effusion or LEFT pneumothorax. Minimal RIGHT basilar atelectasis again noted. No other significant changes are present. IMPRESSION: 1. Slightly decreased moderate RIGHT apical pneumothorax. 2. No other significant change. Electronically Signed   By: Margarette Canada M.D.   On: 11/25/2021 09:07   DG Chest Port 1 View  Result Date: 11/24/2021 CLINICAL DATA:  Postop from right lung resection. EXAM: PORTABLE CHEST 1 VIEW COMPARISON:  11/23/2021 FINDINGS: Increased size of large approximately 50% right pneumothorax. Right chest tube remains in place. Left-sided Port-A-Cath also in appropriate position. Extensive subcutaneous emphysema again noted throughout the chest wall soft tissues. Heart size is within normal limits. IMPRESSION: Increased size of large approximately 50% right pneumothorax. Right chest tube remains in place. Persistent extensive subcutaneous emphysema throughout the chest wall soft tissues. These results will be called to the ordering clinician or representative by the Radiologist Assistant, and communication documented in the PACS or [employer]. Electronically Signed   By: Marlaine Hind M.D.   On: 11/24/2021 08:31   DG Chest Port 1 View  Result Date: 11/23/2021 CLINICAL DATA:  Status post lumpectomy EXAM: PORTABLE CHEST 1 VIEW COMPARISON:  Chest x-ray dated November 21, 2021 FINDINGS: Cardiac and mediastinal contours are within normal limits for AP technique. Left chest wall port. Postsurgical changes of the right lung. Interval decreased pneumomediastinum. Small right apical pneumothorax, unchanged when compared to prior exam. Right-sided chest tube in place. Mild bibasilar atelectasis. No large pleural effusion. Diffuse soft tissue emphysema, similar to prior. IMPRESSION: 1. Small right apical pneumothorax, unchanged when compared to prior exam. Right-sided chest tube in place. 2. Decreased  pneumomediastinum. Electronically Signed   By: Yetta Glassman M.D.   On: 11/23/2021 08:47   DG CHEST PORT 1 VIEW  Result Date: 11/21/2021 CLINICAL DATA:  Pneumothorax EXAM: PORTABLE CHEST 1 VIEW COMPARISON:  11/20/2021 FINDINGS: Right chest tube and left chest port remain in place. Stable heart size. Aortic atherosclerosis. Extensive chest wall subcutaneous emphysema bilaterally. There appears to be a small right-sided pneumothorax (approximately 20% volume), evaluation of which is limited given the degree of overlying subcutaneous emphysema. There is pneumomediastinum. Right greater than left bibasilar atelectasis. IMPRESSION: 1. Probable small right-sided pneumothorax, evaluation of which is limited given the degree of overlying subcutaneous emphysema. Right chest tube remains in place. 2. Pneumomediastinum. Electronically Signed   By: Davina Poke D.O.   On: 11/21/2021 08:58   DG CHEST PORT 1 VIEW  Result Date: 11/20/2021 CLINICAL DATA:  Pneumothorax EXAM: PORTABLE CHEST 1 VIEW COMPARISON:  11/19/2021 FINDINGS: RIGHT chest tube in place. No appreciable pneumothorax. Extensive subcutaneous gas within the RIGHT and LEFT chest wall extending into the neck. Lucency along the LEFT cardiac border suggest small pneumomediastinum. Volume loss in the RIGHT hemithorax. LEFT lung clear. Normal cardiac silhouette. Port in the anterior chest wall with tip in distal SVC. IMPRESSION: 1. No pneumothorax appreciated.  RIGHT chest tube in place. 2. Potential small volume pneumomediastinum. 3. Extensive chest wall subcutaneous gas similar to comparison exam  Electronically Signed   By: Suzy Bouchard M.D.   On: 11/20/2021 08:24   DG CHEST PORT 1 VIEW  Result Date: 11/19/2021 CLINICAL DATA:  Critical Value/emergent results were called by telephone at the time of interpretation on 11/19/2021 at 8:33 am to provider [facility] , who verbally acknowledged these results. EXAM: PORTABLE CHEST 1 VIEW  COMPARISON:  AP chest 11/18/2021, 11/17/2021, CT chest 10/08/2021, FINDINGS: Cardiac silhouette and mediastinal contours are unchanged with moderate calcification again seen within the aortic arch. Left chest wall porta catheter tip overlies the superior vena cava/atrial junction, similar to prior. Right-sided chest tube is seen with tip overlying the superomedial right hemithorax. The tip is slightly more inferiorly and medially positioned compared to the most recent prior 11/18/2021 radiograph. Extensive subcutaneous emphysema is again seen within the right greater than left chest walls and neck bases. Interval increase in size of right sided pneumothorax, now involving greater than 50% of the right hemithorax and measuring up to approximately 12.2 cm in craniocaudal dimension compared to approximately 3.7 cm on the most recent 11/18/2021 radiograph. No acute skeletal abnormality. IMPRESSION: Slightly more inferiorly and medially positioned tip of the right-sided chest tube. Interval increase in size of right-sided pneumothorax, now involving greater than 50% of the right hemithorax and measuring up to approximately 12.2 cm in craniocaudal dimension compared to approximately 3.7 cm on the most recent prior radiograph. Critical Value/emergent results were called by telephone at the time of interpretation on 11/19/2021 at 8:55 am to the patient's inpatient nurseChat Decena, RN , who verbally acknowledged these results. Electronically Signed   By: Yvonne Kendall M.D.   On: 11/19/2021 08:57   DG CHEST PORT 1 VIEW  Result Date: 11/18/2021 CLINICAL DATA:  Follow-up pneumothorax. EXAM: PORTABLE CHEST 1 VIEW COMPARISON:  November 17, 2021 FINDINGS: The right apical pneumothorax has not significantly changed in the interval. The right chest tube remains in stable position as does a left Port-A-Cath. No left-sided pneumothorax identified. Extensive subcutaneous emphysema identified in both sides of the chest in the  bilateral neck bases. The cardiomediastinal silhouette is stable. No other interval changes. IMPRESSION: 1. The right apical pneumothorax has not significantly changed in the interval. The right chest tube remains in stable position. 2. Extensive subcutaneous emphysema. 3. No other changes. Electronically Signed   By: Dorise Bullion III M.D.   On: 11/18/2021 07:46     ASSESSMENT:  Stage II (T1 N1 M0) right upper lobe adenocarcinoma: - CT chest on 06/08/2021: 1.2 x 1.1 cm subsolid subpleural nodule of the peripheral posterior right upper lobe.  Occasional additional small pulmonary nodules measuring 0.4 cm and smaller. - Bronchoscopy (06/11/2021): RUL nodule brushing and FNA: Malignant cells with features of adenocarcinoma. - PET scan (06/22/2021): Subsolid nodular lesion in the right upper lobe, hypermetabolic SUV 15.  Small right hilar and infrahilar lymph nodes with SUV 4.03 concerning for metastatic adenopathy.  Small hypermetabolic left parotid gland lesion, consistent with previous history of Wharton's tumor.  Hypermetabolic focus in the transverse colon SUV 8.69. - MRI of the brain (07/11/2021): No evidence of metastatic disease.  Right sphenoid wing meningioma stable since 2022. - He was evaluated by Dr. Roxan Hockey and was recommended to undergo neoadjuvant chemoimmunotherapy followed by surgical resection. - 3 cycles of carboplatin, pemetrexed from 08/06/2021 through 10/02/2021 (opdivo given with only cycle 2, discontinued for cycle 3 due to cardiac problems) - NGS testing not performed due to insufficient sample. - Guardant360: Negative for EGFR and ALK.  MSI high  not detected. 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  Negative for visceral pleural involvement, LVI.    Social/family history: - Lives at home with his  wife.  Uses cane occasionally after he had stroke in January 2022.  He has retired after working in [employer].  He is current active smoker, 1 pack/day for 60 years.  No exposure to chemicals. - Brother died of metastatic cancer.  Maternal uncle had lung cancer.   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  PT1b pN0. - He had persistent air leak, status post IBV placement.  Chest tube removed on 12/12/2021 by Dr. Roxan Hockey.  He has a follow-up with him around 12/27/2021.  He will need repeat bronchoscopy and IBV removal. - We talked about adjuvant immunotherapy with Keytruda for 1 year which has shown improvement in survival. - Will reevaluate him in 2 weeks and if his functional status improves, he is willing to consider it. - His eating is picking up.  He is eating 2 meals per day for the last 1 week.  Recommend adding 1 can of boost plus daily.  2.  Abdominal uptake on PET scan: - Colonoscopy (07/25/2021): [place], multiple subcentimeter polyps removed from ascending, transverse, descending and sigmoid colon.  Pathology consistent with tubular adenomas.  3.  Atrial fibrillation: - Hospitalized from 09/17/2021 through 09/21/2021 with A-fib with RVR.  He underwent cardioversion.  TEE was negative for atrial thrombus. - 2D echo showed decreased LVEF 35-40%, thought to be secondary to atrial fibrillation. - He has follow-up with cardiology next week. - Will follow-up on 2D echocardiogram.  4.  Depression: - He feels very depressed in the past few weeks. - Will start him on Lexapro 10 mg daily and titrate up.     Orders placed this encounter:  No orders of the defined types were placed in this encounter.     Derek Jack, MD [place] [phone]

## 2021-12-18 ENCOUNTER — Other Ambulatory Visit: Payer: Self-pay

## 2021-12-19 ENCOUNTER — Other Ambulatory Visit: Payer: Self-pay

## 2021-12-23 DIAGNOSIS — I480 Paroxysmal atrial fibrillation: Secondary | ICD-10-CM | POA: Insufficient documentation

## 2021-12-23 NOTE — Progress Notes (Deleted)
Cardiology Office Note   Date:  12/23/2021   ID:  Darryl Ward, DOB [birth year], MRN [id number]  PCP:  [name], NP    No chief complaint on file.     History of Present Illness: Darryl Ward is a 76 y.o. male who is referred by  presents for preop clearance prior to back surgery.   He is referred by [name], NP  I saw him back in 2007.  He was seeing Dr. Domenic Polite.   He had a stress perfusion study prior to aortobifem bypass that occurred in 2013.    He was admitted in January 2021 with questionable stroke.  There were no acute findings.  2D echo was essentially unremarkable with a normal ejection fraction.  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  He had a left parotid tumor. This was biopsied.   There was a meningioma noted.   He has non-small cell lung cancer and is being managed with pemetrexed and carboplatinum.  He was admitted in August with atrial fib.    He was treated with cardioversion.  ***    ***e had Motrin added.  He has been on Eliquis.  He had a TEE cardioversion.  His ejection fraction was reduced to 35% which was new.  In retrospect he is very tired and dizzy when he was in atrial fibrillation but really did not feel palpitations.  Was not having any chest pain.  He did not have any syncope.  He has not had any new shortness of breath, PND or orthopnea.  He feels much better in sinus rhythm.  He has stopped smoking.   Past Medical History:  Diagnosis Date   AAA (abdominal aortic aneurysm) (HCC)    Arthritis    Carotid artery disease ([place])    Nonobstructive   Cataract    Coronary atherosclerosis of native coronary artery    PTCA small diagonal 2007 otherwise nonobstructive CAD   Essential hypertension, benign    Hyperlipidemia    NSTEMI (non-ST elevated myocardial infarction) ([place]) 2007   Stroke [facility]) 2004   TIA (transient ischemic attack) 2006    Past Surgical History:  Procedure Laterality Date   AORTA - BILATERAL FEMORAL  ARTERY BYPASS GRAFT  01/08/2012   Procedure: AORTA BIFEMORAL BYPASS GRAFT;  Surgeon: Elam Dutch, MD;  Location: [facility] OR;  Service: Vascular;  Laterality: Bilateral;  using 18x56mm x 40cm Hemashield Gold Vascular Graft with Endarterectomy, Thombectomy and  Reimplantation of Inferior Mesenteric Artery   BRONCHIAL BIOPSY  06/11/2021   Procedure: BRONCHIAL BIOPSIES;  Surgeon: [name], MD;  Location: [facility] ENDOSCOPY;  Service: Pulmonary;;   BRONCHIAL BRUSHINGS  06/11/2021   Procedure: BRONCHIAL BRUSHINGS;  Surgeon: [name], MD;  Location: [place];  Service: Pulmonary;;   BRONCHIAL NEEDLE ASPIRATION BIOPSY  06/11/2021   Procedure: BRONCHIAL NEEDLE ASPIRATION BIOPSIES;  Surgeon: [name], MD;  Location: [facility] ENDOSCOPY;  Service: Pulmonary;;   CARDIOVERSION N/A 09/20/2021   Procedure: CARDIOVERSION;  Surgeon: Satira Sark, MD;  Location: [facility] ORS;  Service: Cardiovascular;  Laterality: N/A;   COLONOSCOPY N/A 04/14/2019   Procedure: COLONOSCOPY;  Surgeon: Daneil Dolin, MD;  Location: [facility] ENDO SUITE;  Service: Endoscopy;  Laterality: N/A;  9:30   COLONOSCOPY WITH PROPOFOL N/A 07/25/2021   Procedure: COLONOSCOPY WITH PROPOFOL;  Surgeon: Eloise Harman, DO;  [facility]: [facility];  Service: Endoscopy;  Laterality: N/A;  1:00pm   FIDUCIAL MARKER PLACEMENT  06/11/2021  Procedure: FIDUCIAL MARKER PLACEMENT;  Surgeon: [name], MD;  Location: [facility] ENDOSCOPY;  Service: Pulmonary;;   INTERCOSTAL NERVE BLOCK Right 11/12/2021   Procedure: INTERCOSTAL NERVE BLOCK;  Surgeon: [name], MD;  Location: [facility] [facility];  Service: Thoracic;  Laterality: Right;   IR IMAGING GUIDED PORT INSERTION  07/31/2021   Left cataract surgery     LYMPH NODE DISSECTION Right 11/12/2021   Procedure: LYMPH NODE DISSECTION;  Surgeon: [name], MD;  Location: [place];  Service: Thoracic;  Laterality: Right;   POLYPECTOMY  04/14/2019   Procedure: POLYPECTOMY;  Surgeon: Daneil Dolin, MD;   [facility];  Service: Endoscopy;;   POLYPECTOMY  07/25/2021   Procedure: POLYPECTOMY;  Surgeon: Eloise Harman, DO;  [facility];  Service: Endoscopy;;   TEE WITHOUT CARDIOVERSION N/A 09/20/2021   Procedure: TRANSESOPHAGEAL ECHOCARDIOGRAM (TEE);  Surgeon: Satira Sark, MD;  Location: [facility];  Service: Cardiovascular;  Laterality: N/A;   TRANSFORAMINAL LUMBAR INTERBODY FUSION (TLIF) WITH PEDICLE SCREW FIXATION 1 LEVEL N/A 04/27/2020   Procedure: Transforaminal Lumbar Interbody Fusion Lumbar Five-Sacral One;  Surgeon: Vallarie Mare, MD;  Location: [place];  Service: Neurosurgery;  Laterality: N/A;   VIDEO BRONCHOSCOPY WITH INSERTION OF INTERBRONCHIAL VALVE (IBV) N/A 11/22/2021   Procedure: VIDEO BRONCHOSCOPY WITH INSERTION OF INTERBRONCHIAL VALVE (IBV);  Surgeon: [name], MD;  Location[facility] OR;  Service: Thoracic;  Laterality: N/A;   VIDEO BRONCHOSCOPY WITH RADIAL ENDOBRONCHIAL ULTRASOUND  06/11/2021   Procedure: VIDEO BRONCHOSCOPY WITH RADIAL ENDOBRONCHIAL ULTRASOUND;  Surgeon: [name], MD;  Location: [facility] ENDOSCOPY;  Service: Pulmonary;;     Current Outpatient Medications  Medication Sig Dispense Refill   acetaminophen (TYLENOL) 500 MG tablet Take 2 tablets (1,000 mg total) by mouth every 6 (six) hours as needed. (Patient taking differently: Take 1,000 mg by mouth 2 (two) times daily as needed for moderate pain.) 30 tablet 0   apixaban (ELIQUIS) 5 MG TABS tablet Take 1 tablet (5 mg total) by mouth 2 (two) times daily. 180 tablet 1   atorvastatin (LIPITOR) 80 MG tablet Take 80 mg by mouth daily.     cyclobenzaprine (FLEXERIL) 10 MG tablet Take 10 mg by mouth 2 (two) times daily as needed for muscle spasms.     escitalopram (LEXAPRO) 10 MG tablet Take 1 tablet (10 mg total) by mouth daily. 30 tablet 3   folic acid (FOLVITE) 1 MG tablet Take 1 tablet (1 mg total) by mouth daily. 15 tablet 0   furosemide (LASIX) 20 MG tablet Take 1 tablet (20 mg  total) by mouth daily as needed. (Patient taking differently: Take 20 mg by mouth daily as needed for edema (for lower extremities).) 90 tablet 2   metoprolol succinate (TOPROL-XL) 50 MG 24 hr tablet Take 1 tablet (50 mg total) by mouth daily. 90 tablet 1   traMADol (ULTRAM) 50 MG tablet Take 1 tablet (50 mg total) by mouth every 6 (six) hours as needed (mild pain). 30 tablet 0   No current facility-administered medications for this visit.    Allergies:   Patient has no known allergies.   ROS:  Please see the history of present illness.   Otherwise, review of systems are positive for ***.   All other systems are reviewed and negative.    PHYSICAL EXAM: VS:  There were no vitals taken for this visit. , BMI There is no height or weight on file to calculate BMI. GENERAL:  Well appearing  NECK:  No jugular venous distention, waveform within normal limits, carotid upstroke brisk and symmetric, no bruits, no thyromegaly LUNGS:  Clear to auscultation bilaterally CHEST:  Unremarkable HEART:  PMI not displaced or sustained,S1 and S2 within normal limits, no S3, no S4, no clicks, no rubs, *** murmurs ABD:  Flat, positive bowel sounds normal in frequency in pitch, no bruits, no rebound, no guarding, no midline pulsatile mass, no hepatomegaly, no splenomegaly EXT:  2 plus pulses throughout, no edema, no cyanosis no clubbing    ***GENERAL: Slightly frail appearing NECK:  No jugular venous distention, waveform within normal limits, carotid upstroke brisk and symmetric, no bruits, no thyromegaly LUNGS:  Clear to auscultation bilaterally CHEST:  Unremarkable HEART:  PMI not displaced or sustained,S1 and S2 within normal limits, no S3, no S4, no clicks, no rubs, no murmurs ABD:  Flat, positive bowel sounds normal in frequency in pitch, no bruits, no rebound, no guarding, no midline pulsatile mass, no hepatomegaly, no splenomegaly EXT:  2 plus pulses throughout, no edema, no cyanosis no clubbing   EKG:   EKG is *** ordered today. The ekg ordered today so demonstrates sinus rhythm, rate ***, axis within normal limits, intervals within normal limits, premature atrial contractions, no acute ST-T wave changes.  Atrial contractions Premature  Recent Labs: 09/17/2021: TSH 1.534 12/04/2021: B Natriuretic Peptide 62.8 12/17/2021: ALT 14; BUN 13; Creatinine, Ser 0.89; Hemoglobin 9.6; Magnesium 1.6; Platelets 220; Potassium 3.8; Sodium 137    Lipid Panel    Component Value Date/Time   CHOL 202 (H) 02/05/2020 0755   CHOL 198 11/25/2019 1123   TRIG 99 02/05/2020 0755   HDL 29 (L) 02/05/2020 0755   HDL 36 (L) 11/25/2019 1123   CHOLHDL 7.0 02/05/2020 0755   VLDL 20 02/05/2020 0755   LDLCALC 153 (H) 02/05/2020 0755   LDLCALC 139 (H) 11/25/2019 1123      Wt Readings from Last 3 Encounters:  12/17/21 150 lb 5.7 oz (68.2 kg)  12/11/21 152 lb (68.9 kg)  12/04/21 160 lb (72.6 kg)      Other studies Reviewed: Additional studies/ records that were reviewed today include:  *** Review of the above records demonstrates:  Please see elsewhere in the note.     ASSESSMENT AND PLAN:  Atrial fibrillation with RVR:  ***  He is maintaining sinus rhythm.  They are going to get a pulse oximeter to follow heart rate.  He will continue with anticoagulation.   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  History of CAD with prior MI:   ***  He has no ongoing anginal symptoms.  Continue with meds as listed.   Prior CVA/PVD:  ***  He will continue with his statin.   Chronic anemia/thrombocytopenia:   ***   Hypertension: The blood pressure is *** running a little bit low as he is lost weight and he is now on beta-blocker.  Note med titration.   Stage II lung adenocarcinoma:    ***  I told him it  is okay to go ahead with his chemotherapy.  We will certainly watch his rhythm.  He is going to have probably a lung resection in about 3 months but I will see him before then.  Tobacco abuse:   ***  He quit smoking about 5 weeks ago.    Current medicines are reviewed at length with the patient today.  The patient does not have concerns regarding medicines.  The following changes have been made: None  Labs/ tests ordered today include: None  No orders of the defined types were placed in this encounter.    Disposition:   FU with me in *** months   Signed, Minus Breeding, MD  12/23/2021 8:22 PM    [facility]

## 2021-12-25 ENCOUNTER — Ambulatory Visit: Payer: Medicare Other | Admitting: Cardiology

## 2021-12-25 DIAGNOSIS — I480 Paroxysmal atrial fibrillation: Secondary | ICD-10-CM

## 2021-12-25 DIAGNOSIS — I5041 Acute combined systolic (congestive) and diastolic (congestive) heart failure: Secondary | ICD-10-CM

## 2021-12-25 DIAGNOSIS — I2510 Atherosclerotic heart disease of native coronary artery without angina pectoris: Secondary | ICD-10-CM

## 2021-12-26 ENCOUNTER — Other Ambulatory Visit: Payer: Self-pay | Admitting: Thoracic Surgery (Cardiothoracic Vascular Surgery)

## 2021-12-26 DIAGNOSIS — C3490 Malignant neoplasm of unspecified part of unspecified bronchus or lung: Secondary | ICD-10-CM

## 2021-12-26 NOTE — Progress Notes (Unsigned)
Office Visit    Patient Name: JAKAI RISSE Date of Encounter: 12/27/2021  PCP:  [name], NP   [place]  Cardiologist:  Minus Breeding, MD  Advanced Practice Provider:  No care team member to display Electrophysiologist:  None   HPI    Darryl Ward is a 76 y.o. male with a past medical history significant for abdominal aortic aneurysm, carotid artery disease (nonobstructive) coronary atherosclerosis, essential hypertension, hyperlipidemia, NSTEMI, stroke/TIA presents today for follow-up visit.  He was originally seen by Dr. Percival Spanish back in 2007.  He had a stress perfusion study prior to aortobifem bypass that occurred in 2013.  He was admitted January 2021 with questionable stroke.  No acute findings.  2D echocardiogram essentially unremarkable with normal ejection fraction.  Found to have L5-S1 stenosis.  He had a left parotid tumor.  This was biopsied.  There was a meningioma noted.  He had  non-small cell lung cancer and was being managed with pemetrexed and carboplatinum.  He was admitted in August with atrial fibrillation.  He was treated with cardioversion.  He had been on Eliquis.  Ejection fraction was reduced to 35% which was new.  In retrospect, he was very tired and dizzy when he was in atrial fibrillation but did not really have any palpitations.  He was not having any chest pain.  He did not have any syncope.  No new shortness of breath.  He feels much better normal sinus rhythm.  Today, he tells me that he gets fatigued with activity but no chest pain or SOB. He was admitted Oct 23-Nov 8th and he did not require a shock at that time. He had DCCV back in August and did convert to NSR. He is in Afib today, rate 130. We will plan to administer metoprolol tartrate 25 mg now and increase his succinate dose. He is suppose to see oncology next week and they wanted to get an EKG done before his appointment.  Reports no shortness of breath nor  dyspnea on exertion. Reports no chest pain, pressure, or tightness. No edema, orthopnea, PND.    Past Medical History    Past Medical History:  Diagnosis Date   AAA (abdominal aortic aneurysm) (HCC)    Arthritis    Carotid artery disease ([place])    Nonobstructive   Cataract    Coronary atherosclerosis of native coronary artery    PTCA small diagonal 2007 otherwise nonobstructive CAD   Essential hypertension, benign    Hyperlipidemia    NSTEMI (non-ST elevated myocardial infarction) ([place]) 2007   Stroke [facility]) 2004   TIA (transient ischemic attack) 2006   Past Surgical History:  Procedure Laterality Date   AORTA - BILATERAL FEMORAL ARTERY BYPASS GRAFT  01/08/2012   Procedure: AORTA BIFEMORAL BYPASS GRAFT;  Surgeon: Elam Dutch, MD;  Location: [facility] OR;  Service: Vascular;  Laterality: Bilateral;  using 18x41mm x 40cm Hemashield Gold Vascular Graft with Endarterectomy, Thombectomy and  Reimplantation of Inferior Mesenteric Artery   BRONCHIAL BIOPSY  06/11/2021   Procedure: BRONCHIAL BIOPSIES;  Surgeon: [name], MD;  Location: [facility] ENDOSCOPY;  Service: Pulmonary;;   BRONCHIAL BRUSHINGS  06/11/2021   Procedure: BRONCHIAL BRUSHINGS;  Surgeon: [name], MD;  Location: [facility] ENDOSCOPY;  Service: Pulmonary;;   BRONCHIAL NEEDLE ASPIRATION BIOPSY  06/11/2021   Procedure: BRONCHIAL NEEDLE ASPIRATION BIOPSIES;  Surgeon: [name], MD;  Location: [facility] ENDOSCOPY;  Service: Pulmonary;;   CARDIOVERSION N/A 09/20/2021  Procedure: CARDIOVERSION;  Surgeon: Satira Sark, MD;  Location: [facility] ORS;  Service: Cardiovascular;  Laterality: N/A;   COLONOSCOPY N/A 04/14/2019   Procedure: COLONOSCOPY;  Surgeon: Daneil Dolin, MD;  Location: [facility] ENDO SUITE;  Service: Endoscopy;  Laterality: N/A;  9:30   COLONOSCOPY WITH PROPOFOL N/A 07/25/2021   Procedure: COLONOSCOPY WITH PROPOFOL;  Surgeon: Eloise Harman, DO;  Location: [facility] ENDO SUITE;  Service: Endoscopy;  Laterality: N/A;  1:00pm   FIDUCIAL  MARKER PLACEMENT  06/11/2021   Procedure: FIDUCIAL MARKER PLACEMENT;  Surgeon: [name], MD;  Location: [facility] ENDOSCOPY;  Service: Pulmonary;;   INTERCOSTAL NERVE BLOCK Right 11/12/2021   Procedure: INTERCOSTAL NERVE BLOCK;  Surgeon: [name], MD;  Location: [place];  Service: Thoracic;  Laterality: Right;   IR IMAGING GUIDED PORT INSERTION  07/31/2021   Left cataract surgery     LYMPH NODE DISSECTION Right 11/12/2021   Procedure: LYMPH NODE DISSECTION;  Surgeon: [name], MD;  Location: [place];  Service: Thoracic;  Laterality: Right;   POLYPECTOMY  04/14/2019   Procedure: POLYPECTOMY;  Surgeon: Daneil Dolin, MD;  [facility];  Service: Endoscopy;;   POLYPECTOMY  07/25/2021   Procedure: POLYPECTOMY;  Surgeon: Eloise Harman, DO;  [facility];  Service: Endoscopy;;   TEE WITHOUT CARDIOVERSION N/A 09/20/2021   Procedure: TRANSESOPHAGEAL ECHOCARDIOGRAM (TEE);  Surgeon: Satira Sark, MD;  Location: [facility] ORS;  Service: Cardiovascular;  Laterality: N/A;   TRANSFORAMINAL LUMBAR INTERBODY FUSION (TLIF) WITH PEDICLE SCREW FIXATION 1 LEVEL N/A 04/27/2020   Procedure: Transforaminal Lumbar Interbody Fusion Lumbar Five-Sacral One;  Surgeon: Vallarie Mare, MD;  Location: [place];  Service: Neurosurgery;  Laterality: N/A;   VIDEO BRONCHOSCOPY WITH INSERTION OF INTERBRONCHIAL VALVE (IBV) N/A 11/22/2021   Procedure: VIDEO BRONCHOSCOPY WITH INSERTION OF INTERBRONCHIAL VALVE (IBV);  Surgeon: [name], MD;  Location: [place];  Service: Thoracic;  Laterality: N/A;   VIDEO BRONCHOSCOPY WITH RADIAL ENDOBRONCHIAL ULTRASOUND  06/11/2021   Procedure: VIDEO BRONCHOSCOPY WITH RADIAL ENDOBRONCHIAL ULTRASOUND;  Surgeon: [name], MD;  Location: [facility] ENDOSCOPY;  Service: Pulmonary;;    Allergies  No Known Allergies   EKGs/Labs/Other Studies Reviewed:   The following studies were reviewed today:  Echo 09/20/21  IMPRESSIONS     1. Limited study  in terms of color Doppler and zooming capabilies of  echocardiographic work station.   2. Left ventricular ejection fraction, by estimation, is 30 to 35%. The  left ventricle has moderately decreased function. The left ventricle  demonstrates global hypokinesis.   3. Right ventricular systolic function is mildly reduced. The right  ventricular size is normal.   4. Left atrial size was moderately dilated. No left atrial/left atrial  appendage thrombus was detected.   5. Right atrial size was moderately dilated.   6. The mitral valve is grossly normal. Mild mitral valve regurgitation.   7. Tricuspid valve regurgitation is mild to moderate.   8. The aortic valve is tricuspid. Aortic valve regurgitation is not  visualized.   9. There is Moderate (Grade III) atheroma plaque involving the descending  aorta.   FINDINGS   Left Ventricle: Left ventricular ejection fraction, by estimation, is 30  to 35%. The left ventricle has moderately decreased function. The left  ventricle demonstrates global hypokinesis. The left ventricular internal  cavity size was normal in size.   Right Ventricle: The right ventricular size is normal. No increase in  right ventricular wall thickness. Right  ventricular systolic function is  mildly reduced.   Left Atrium: Left atrial size was moderately dilated. No left atrial/left  atrial appendage thrombus was detected.   Right Atrium: Right atrial size was moderately dilated.   Pericardium: There is no evidence of pericardial effusion.   Mitral Valve: The mitral valve is grossly normal. Mild mitral valve  regurgitation.   Tricuspid Valve: The tricuspid valve is grossly normal. Tricuspid valve  regurgitation is mild to moderate.   Aortic Valve: The aortic valve is tricuspid. Aortic valve regurgitation is  not visualized.   Pulmonic Valve: The pulmonic valve was grossly normal. Pulmonic valve  regurgitation is mild.   Aorta: The aortic root is normal in  size and structure. There is moderate  (Grade III) atheroma plaque involving the descending aorta.   IAS/Shunts: The interatrial septum was not assessed.    EKG:  EKG is  ordered today.  The ekg ordered today demonstrates Afib with RVR rate 130.  Recent Labs: 09/17/2021: TSH 1.534 12/04/2021: B Natriuretic Peptide 62.8 12/17/2021: ALT 14; BUN 13; Creatinine, Ser 0.89; Hemoglobin 9.6; Magnesium 1.6; Platelets 220; Potassium 3.8; Sodium 137  Recent Lipid Panel    Component Value Date/Time   CHOL 202 (H) 02/05/2020 0755   CHOL 198 11/25/2019 1123   TRIG 99 02/05/2020 0755   HDL 29 (L) 02/05/2020 0755   HDL 36 (L) 11/25/2019 1123   CHOLHDL 7.0 02/05/2020 0755   VLDL 20 02/05/2020 0755   LDLCALC 153 (H) 02/05/2020 0755   LDLCALC 139 (H) 11/25/2019 1123    Risk Assessment/Calculations:   CHA2DS2-VASc Score = 4   This indicates a 4.8% annual risk of stroke. The patient's score is based upon: CHF History: 0 HTN History: 1 Diabetes History: 0 Stroke History: 2 Vascular Disease History: 0 Age Score: 1 Gender Score: 0     Home Medications   Current Meds  Medication Sig   acetaminophen (TYLENOL) 500 MG tablet Take 2 tablets (1,000 mg total) by mouth every 6 (six) hours as needed. (Patient taking differently: Take 1,000 mg by mouth 2 (two) times daily as needed for moderate pain.)   apixaban (ELIQUIS) 5 MG TABS tablet Take 1 tablet (5 mg total) by mouth 2 (two) times daily.   atorvastatin (LIPITOR) 80 MG tablet Take 80 mg by mouth daily.   cyclobenzaprine (FLEXERIL) 10 MG tablet Take 10 mg by mouth 2 (two) times daily as needed for muscle spasms.   escitalopram (LEXAPRO) 10 MG tablet Take 1 tablet (10 mg total) by mouth daily.   folic acid (FOLVITE) 1 MG tablet Take 1 tablet (1 mg total) by mouth daily.   furosemide (LASIX) 20 MG tablet Take 1 tablet (20 mg total) by mouth daily as needed. (Patient taking differently: Take 20 mg by mouth daily as needed for edema (for lower  extremities).)   traMADol (ULTRAM) 50 MG tablet Take 1 tablet (50 mg total) by mouth every 6 (six) hours as needed (mild pain).   [DISCONTINUED] metoprolol succinate (TOPROL-XL) 50 MG 24 hr tablet Take 1 tablet (50 mg total) by mouth daily.     Review of Systems      All other systems reviewed and are otherwise negative except as noted above.  Physical Exam    VS:  BP 130/60   Pulse (!) 130   Ht 5\' 10"  (1.778 m)   Wt 147 lb (66.7 kg)   SpO2 94%   BMI 21.09 kg/m  , BMI Body mass index is 21.09 kg/m.  Wt Readings from Last 3 Encounters:  12/27/21 147 lb (66.7 kg)  12/17/21 150 lb 5.7 oz (68.2 kg)  12/11/21 152 lb (68.9 kg)     GEN: Well nourished, well developed, in no acute distress. HEENT: normal. Neck: Supple, no JVD, carotid bruits, or masses. Cardiac: irregular irregular, no murmurs, rubs, or gallops. No clubbing, cyanosis, edema.  Radials/PT 2+ and equal bilaterally.  Respiratory:  Respirations regular and unlabored, clear to auscultation bilaterally. GI: Soft, nontender, nondistended. MS: No deformity or atrophy. Skin: Warm and dry, no rash. Neuro:  Strength and sensation are intact. Psych: Normal affect.  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  Signed, Elgie Collard, PA-C 12/27/2021, 3:16 PM [facility]

## 2021-12-27 ENCOUNTER — Ambulatory Visit: Payer: Medicare Other | Attending: Cardiology | Admitting: Physician Assistant

## 2021-12-27 ENCOUNTER — Ambulatory Visit (INDEPENDENT_AMBULATORY_CARE_PROVIDER_SITE_OTHER): Payer: Self-pay | Admitting: Thoracic Surgery (Cardiothoracic Vascular Surgery)

## 2021-12-27 ENCOUNTER — Encounter: Payer: Self-pay | Admitting: *Deleted

## 2021-12-27 ENCOUNTER — Ambulatory Visit
Admission: RE | Admit: 2021-12-27 | Discharge: 2021-12-27 | Disposition: A | Discharge status: Home / Self Care | Payer: Medicare Other | Source: Ambulatory Visit | Attending: Thoracic Surgery (Cardiothoracic Vascular Surgery) | Admitting: Thoracic Surgery (Cardiothoracic Vascular Surgery)

## 2021-12-27 ENCOUNTER — Encounter: Payer: Self-pay | Admitting: Physician Assistant

## 2021-12-27 VITALS — BP 101/59 | HR 76 | Resp 20 | Ht 70.0 in | Wt 147.0 lb

## 2021-12-27 VITALS — BP 130/60 | HR 130 | Ht 70.0 in | Wt 147.0 lb

## 2021-12-27 DIAGNOSIS — I4891 Unspecified atrial fibrillation: Secondary | ICD-10-CM

## 2021-12-27 DIAGNOSIS — J948 Other specified pleural conditions: Secondary | ICD-10-CM | POA: Diagnosis not present

## 2021-12-27 DIAGNOSIS — N179 Acute kidney failure, unspecified: Secondary | ICD-10-CM | POA: Diagnosis not present

## 2021-12-27 DIAGNOSIS — C3411 Malignant neoplasm of upper lobe, right bronchus or lung: Secondary | ICD-10-CM

## 2021-12-27 DIAGNOSIS — E785 Hyperlipidemia, unspecified: Secondary | ICD-10-CM

## 2021-12-27 DIAGNOSIS — C3490 Malignant neoplasm of unspecified part of unspecified bronchus or lung: Secondary | ICD-10-CM | POA: Diagnosis not present

## 2021-12-27 DIAGNOSIS — I739 Peripheral vascular disease, unspecified: Secondary | ICD-10-CM | POA: Diagnosis not present

## 2021-12-27 DIAGNOSIS — I2510 Atherosclerotic heart disease of native coronary artery without angina pectoris: Secondary | ICD-10-CM

## 2021-12-27 DIAGNOSIS — I5041 Acute combined systolic (congestive) and diastolic (congestive) heart failure: Secondary | ICD-10-CM

## 2021-12-27 DIAGNOSIS — J939 Pneumothorax, unspecified: Secondary | ICD-10-CM | POA: Diagnosis not present

## 2021-12-27 DIAGNOSIS — Z902 Acquired absence of lung [part of]: Secondary | ICD-10-CM

## 2021-12-27 DIAGNOSIS — D649 Anemia, unspecified: Secondary | ICD-10-CM

## 2021-12-27 DIAGNOSIS — D696 Thrombocytopenia, unspecified: Secondary | ICD-10-CM

## 2021-12-27 DIAGNOSIS — I10 Essential (primary) hypertension: Secondary | ICD-10-CM

## 2021-12-27 MED ORDER — MAGNESIUM OXIDE 400 MG PO CAPS: 400.0000 mg | ORAL_CAPSULE | Freq: Every day | ORAL | 0 refills | Status: DC

## 2021-12-27 MED ORDER — METOPROLOL TARTRATE 50 MG PO TABS
50.0000 mg | ORAL_TABLET | Freq: Once | ORAL | Status: AC
  Administered 2021-12-27: 25 mg via ORAL

## 2021-12-27 MED ORDER — METOPROLOL SUCCINATE ER 50 MG PO TB24: 75.0000 mg | ORAL_TABLET | Freq: Every day | ORAL | 3 refills | Status: DC

## 2021-12-27 NOTE — Patient Instructions (Addendum)
Medication Instructions:  1.Increase metoprolol succinate to 75 mg daily 2.Start over the counter magnesium oxide 400 mg daily *If you need a refill on your cardiac medications before your next appointment, please call your pharmacy*   Lab Work: BMET and CBC one week prior to cardioversion that is scheduled for 01/15/22 at 12:45 PM If you have labs (blood work) drawn today and your tests are completely normal, you will receive your results only by: [place] (if you have MyChart) OR A paper copy in the mail If you have any lab test that is abnormal or we need to change your treatment, we will call you to review the results.   Testing/Procedures: Your physician has recommended that you have a Cardioversion (DCCV). Electrical Cardioversion uses a jolt of electricity to your heart either through paddles or wired patches attached to your chest. This is a controlled, usually prescheduled, procedure. Defibrillation is done under light anesthesia in the hospital, and you usually go home the day of the procedure. This is done to get your heart back into a normal rhythm. You are not awake for the procedure. Please see the instruction sheet given to you today.    Follow-Up: At [facility], you and your health needs are our priority.  As part of our continuing mission to provide you with exceptional heart care, we have created designated Provider Care Teams.  These Care Teams include your primary Cardiologist (physician) and Advanced Practice Providers (APPs -  Physician Assistants and Nurse Practitioners) who all work together to provide you with the care you need, when you need it.   Your next appointment:   After cardioversion  The format for your next appointment:   In Person  Provider:   Minus Breeding, MD    Other Instructions     Dear Dierdre Harness  You are scheduled for a Cardioversion on Tuesday, December 26 with Dr. Harriet Masson.  Please arrive at the [facility] (Main  Entrance A) at [facility]: [address] [place], [place] [address] at 11:30 AM.   DIET:  Nothing to eat or drink after midnight except a sip of water with medications (see medication instructions below)  MEDICATION INSTRUCTIONS: {        :1 Continue taking your anticoagulant (blood thinner): Apixaban (Eliquis).  You will need to continue this after your procedure until you are told by your provider that it is safe to stop.    LABS:   Come to the lab at [facility] at [address] between the hours of 8:00 am and 4:30 pm. You do NOT have to be fasting.  FYI:  For your safety, and to allow [name] to monitor your vital signs accurately during the surgery/procedure we request: If you have artificial nails, gel coating, SNS etc, please have those removed prior to your surgery/procedure. Not having the nail coverings /polish removed may result in cancellation or delay of your surgery/procedure.  You must have a responsible person to drive you home and stay in the waiting area during your procedure. Failure to do so could result in cancellation.  Bring your insurance cards.  *Special Note: Every effort is made to have your procedure done on time. Occasionally there are emergencies that occur at the hospital that may cause delays. Please be patient if a delay does occur.      Important Information About Sugar

## 2021-12-27 NOTE — H&P (View-Only) (Signed)
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  He was found to have a right upper lobe lung nodule and a low-dose CT lung cancer screening.  Clinical stage IIb disease.  Biopsy showed adenocarcinoma.  Underwent neoadjuvant chemoimmunotherapy followed by robotic assisted right upper lobectomy.  Pathologic stage was 1A(pyT1,pyN0)  Postoperative course was complicated by a prolonged air leak requiring IBV placement on 11/22/2021.  Since discharge she has had issues with poor appetite.  He had an ER visit for dehydration at one point.  He denies any pain.  Still generally weak.  Past Medical History:  Diagnosis Date   AAA (abdominal aortic aneurysm) (HCC)    Arthritis    Carotid artery disease ([place])    Nonobstructive   Cataract    Coronary atherosclerosis of native coronary artery    PTCA small diagonal 2007 otherwise nonobstructive CAD   Essential hypertension, benign    Hyperlipidemia    NSTEMI (non-ST elevated myocardial infarction) ([place]) 2007   Stroke [facility]) 2004   TIA (transient ischemic attack) 2006    Current Outpatient Medications  Medication Sig Dispense Refill   acetaminophen (TYLENOL) 500 MG tablet Take 2 tablets (1,000 mg total) by mouth every 6 (six) hours as needed. (Patient taking differently: Take 1,000 mg by mouth 2 (two) times daily as needed for moderate pain.) 30 tablet 0   apixaban (ELIQUIS) 5 MG TABS tablet Take 1 tablet (5 mg total) by mouth 2 (two) times daily. 180 tablet 1   atorvastatin (LIPITOR) 80 MG tablet Take 80 mg by mouth daily.     cyclobenzaprine (FLEXERIL) 10 MG tablet Take 10  mg by mouth 2 (two) times daily as needed for muscle spasms.     escitalopram (LEXAPRO) 10 MG tablet Take 1 tablet (10 mg total) by mouth daily. 30 tablet 3   folic acid (FOLVITE) 1 MG tablet Take 1 tablet (1 mg total) by mouth daily. 15 tablet 0   furosemide (LASIX) 20 MG tablet Take 1 tablet (20 mg total) by mouth daily as needed. (Patient taking differently: Take 20 mg by mouth daily as needed for edema (for lower extremities).) 90 tablet 2   Magnesium Oxide 400 MG CAPS Take 1 capsule (400 mg total) by mouth daily.  0   metoprolol succinate (TOPROL-XL) 50 MG 24 hr tablet Take 1.5 tablets (75 mg total) by mouth daily. Take with or immediately following a meal. 135 tablet 3   traMADol (ULTRAM) 50 MG tablet Take 1 tablet (50 mg total) by mouth every 6 (six) hours as needed (mild pain). 30 tablet 0   No current facility-administered medications for this visit.    Physical Exam BP (!) 101/59   Pulse 76   Resp 20   Ht 5\' 10"  (1.778 m)   Wt 147 lb (66.7 kg)   SpO2 96%   BMI 21.76 kg/m  76 year old man in no acute distress Alert and oriented x 3 with no focal motor deficits Hoarse voice Lungs diminished at right base otherwise clear Cardiac irregular rhythm, normal rate Incisions well-healed  Diagnostic Tests:  I personally reviewed the chest x-ray.  No change in the postoperative apical space.  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  Adenocarcinoma of the lung-clinical stage IIb, pathologic stage Ia.  Status post right upper lobectomy.  He has an appoint with Dr. Delton Coombes next week to discuss continuing immunotherapy.  Status post right upper lobectomy-denies pain.  Still has a way to go in terms of recovery.  Likely will take him a few  months.  Prolonged air leak-still has IBV in place in the middle lobe bronchi.  Those were placed a month ago.  Will plan to do bronchoscopy for IBV removal in early January.  Then will be 8 weeks from placement.  Plan:  Follow-up with Dr. Delton Coombes next week Bronch for IBV removal on 01/24/2022  [name], MD [facility] [phone]   Patient needs a 3 in 1 commode from home health  Darryl Ward. Roxan Hockey, MD [facility] [phone]

## 2021-12-27 NOTE — Progress Notes (Addendum)
[address]       [place] [address]             [phone]     HPI: Mr. 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  He was found to have a right upper lobe lung nodule and a low-dose CT lung cancer screening.  Clinical stage IIb disease.  Biopsy showed adenocarcinoma.  Underwent neoadjuvant chemoimmunotherapy followed by robotic assisted right upper lobectomy.  Pathologic stage was 1A(pyT1,pyN0)  Postoperative course was complicated by a prolonged air leak requiring IBV placement on 11/22/2021.  Since discharge she has had issues with poor appetite.  He had an ER visit for dehydration at one point.  He denies any pain.  Still generally weak.  Past Medical History:  Diagnosis Date   AAA (abdominal aortic aneurysm) (HCC)    Arthritis    Carotid artery disease ([place])    Nonobstructive   Cataract    Coronary atherosclerosis of native coronary artery    PTCA small diagonal 2007 otherwise nonobstructive CAD   Essential hypertension, benign    Hyperlipidemia    NSTEMI (non-ST elevated myocardial infarction) ([place]) 2007   Stroke [facility]) 2004   TIA (transient ischemic attack) 2006    Current Outpatient Medications  Medication Sig Dispense Refill   acetaminophen (TYLENOL) 500 MG tablet Take 2 tablets (1,000 mg total) by mouth every 6 (six) hours as needed. (Patient taking differently: Take 1,000 mg by mouth 2 (two) times daily as needed for moderate pain.) 30 tablet 0   apixaban (ELIQUIS) 5 MG TABS tablet Take 1 tablet (5 mg total) by mouth 2 (two) times daily. 180 tablet 1   atorvastatin (LIPITOR) 80 MG tablet Take 80 mg by mouth daily.     cyclobenzaprine (FLEXERIL) 10 MG tablet Take 10  mg by mouth 2 (two) times daily as needed for muscle spasms.     escitalopram (LEXAPRO) 10 MG tablet Take 1 tablet (10 mg total) by mouth daily. 30 tablet 3   folic acid (FOLVITE) 1 MG tablet Take 1 tablet (1 mg total) by mouth daily. 15 tablet 0   furosemide (LASIX) 20 MG tablet Take 1 tablet (20 mg total) by mouth daily as needed. (Patient taking differently: Take 20 mg by mouth daily as needed for edema (for lower extremities).) 90 tablet 2   Magnesium Oxide 400 MG CAPS Take 1 capsule (400 mg total) by mouth daily.  0   metoprolol succinate (TOPROL-XL) 50 MG 24 hr tablet Take 1.5 tablets (75 mg total) by mouth daily. Take with or immediately following a meal. 135 tablet 3   traMADol (ULTRAM) 50 MG tablet Take 1 tablet (50 mg total) by mouth every 6 (six) hours as needed (mild pain). 30 tablet 0   No current facility-administered medications for this visit.    Physical Exam BP (!) 101/59   Pulse 76   Resp 20   Ht 5\' 10"  (1.778 m)   Wt 147 lb (66.7 kg)   SpO2 96%   BMI 21.24 kg/m  76 year old man in no acute distress Alert and oriented x 3 with no focal motor deficits Hoarse voice Lungs diminished at right base otherwise clear Cardiac irregular rhythm, normal rate Incisions well-healed  Diagnostic Tests:  I personally reviewed the chest x-ray.  No change in the postoperative apical space.  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  Adenocarcinoma of the lung-clinical stage IIb, pathologic stage Ia.  Status post right upper lobectomy.  He has an appoint with Dr. Delton Coombes next week to discuss continuing immunotherapy.  Status post right upper lobectomy-denies pain.  Still has a way to go in terms of recovery.  Likely will take him a few  months.  Prolonged air leak-still has IBV in place in the middle lobe bronchi.  Those were placed a month ago.  Will plan to do bronchoscopy for IBV removal in early January.  Then will be 8 weeks from placement.  Plan:  Follow-up with Dr. Delton Coombes next week Bronch for IBV removal on 01/24/2022  [name], MD [facility] [phone]   Patient needs a 3 in 1 commode from home health  Revonda Standard. Roxan Hockey, MD [facility] [phone]

## 2021-12-28 ENCOUNTER — Other Ambulatory Visit: Payer: Self-pay

## 2021-12-28 ENCOUNTER — Encounter: Payer: Self-pay | Admitting: *Deleted

## 2021-12-28 ENCOUNTER — Other Ambulatory Visit: Payer: Self-pay | Admitting: *Deleted

## 2021-12-28 ENCOUNTER — Telehealth: Payer: Self-pay

## 2021-12-28 DIAGNOSIS — J9382 Other air leak: Secondary | ICD-10-CM

## 2021-12-28 NOTE — Telephone Encounter (Signed)
3-n-1 commode ordered through [facility] per Dr. Leonarda Salon orders. Order faxed.

## 2021-12-31 ENCOUNTER — Inpatient Hospital Stay: Payer: Medicare Other | Attending: Hematology | Admitting: Dietician

## 2021-12-31 NOTE — Progress Notes (Signed)
Nutrition Follow-up:  Patient with non-small cell lung cancer. He completed neoadjuvant chemoimmunotherapy with carbo/pemetrexed x 4 cycles (7/17-9/12) followed by right upper lobectomy on 10/23 under the care of Dr. Roxan Hockey. Postoperative course complicated by prolonged air leak requiring IBV placement on 11/2.   10/23-11/8 hospital admission  Nutrition follow-up completed with wife of patient via telephone. Wife reports patient is still sleeping this morning. He stays tired now secondary to afib. Planing for cardioversion 12/26. Wife reports patient gets out of breath easily and spends much of his time sitting when awake. Patient is eating, but not much at one time. She encourages pt to eat 6-7 times day. Wife reports pt usually eats 2 small meals. Recently eating a lot of beans (white beans, pintos). He continues to have metallic taste.  Wife says they have run out of Boost. He has not had any ~5 days. These are expensive to buy. Denies nausea, vomiting, diarrhea, constipation.   Medications: mag-ox, metoprolol  Labs: 11/27 labs - albumin 2.5, Mg 1.6, Hgb 9.6  Anthropometrics: Last wt 147 lb on 12/7  11/27 - 150 lb 5.7 oz 10/19 - 161 lb 3.2 oz  -14 lb (8.7%) in 7 weeks - severe  NUTRITION DIAGNOSIS: Unintentional weight loss continues    MALNUTRITION DIAGNOSIS: Suspect degree of malnutrition given ongoing weight loss and poor appetite, however unable to diagnose at this time without completing NFPE   INTERVENTION:  Continue encouraging small frequent meals and snacks  Educated on soft moist high protein foods for ease of intake - will leave handout with ideas Recommend 2-3 Ensure Plus/equivalent  Will provide 2 complimentary cases Ensure Plus HP  Reviewed strategies for altered taste, encouraged baking soda salt water rinses several times daily before meals  Will leave handouts + Ensure at registration desk for pick up 12/13 (wife aware)    MONITORING, EVALUATION, GOAL:  weight trends, intake   NEXT VISIT: To be scheduled with treatment plan

## 2022-01-02 ENCOUNTER — Ambulatory Visit: Payer: Medicare Other | Admitting: Hematology

## 2022-01-02 ENCOUNTER — Other Ambulatory Visit: Payer: Self-pay

## 2022-01-08 ENCOUNTER — Ambulatory Visit: Payer: Medicare Other | Attending: Physician Assistant

## 2022-01-08 DIAGNOSIS — I10 Essential (primary) hypertension: Secondary | ICD-10-CM

## 2022-01-08 DIAGNOSIS — D696 Thrombocytopenia, unspecified: Secondary | ICD-10-CM

## 2022-01-08 DIAGNOSIS — E785 Hyperlipidemia, unspecified: Secondary | ICD-10-CM

## 2022-01-08 DIAGNOSIS — N179 Acute kidney failure, unspecified: Secondary | ICD-10-CM

## 2022-01-08 DIAGNOSIS — C3411 Malignant neoplasm of upper lobe, right bronchus or lung: Secondary | ICD-10-CM

## 2022-01-08 DIAGNOSIS — I5041 Acute combined systolic (congestive) and diastolic (congestive) heart failure: Secondary | ICD-10-CM

## 2022-01-08 DIAGNOSIS — I739 Peripheral vascular disease, unspecified: Secondary | ICD-10-CM

## 2022-01-08 DIAGNOSIS — I4891 Unspecified atrial fibrillation: Secondary | ICD-10-CM

## 2022-01-08 DIAGNOSIS — I2510 Atherosclerotic heart disease of native coronary artery without angina pectoris: Secondary | ICD-10-CM

## 2022-01-08 DIAGNOSIS — D649 Anemia, unspecified: Secondary | ICD-10-CM

## 2022-01-09 ENCOUNTER — Encounter: Payer: Self-pay | Admitting: Hematology

## 2022-01-09 LAB — CBC
Hematocrit: 32 % — ABNORMAL LOW (ref 37.5–51.0)
Hemoglobin: 10.4 g/dL — ABNORMAL LOW (ref 13.0–17.7)
MCH: 31.6 pg (ref 26.6–33.0)
MCHC: 32.5 g/dL (ref 31.5–35.7)
MCV: 97 fL (ref 79–97)
Platelets: 148 10*3/uL — ABNORMAL LOW (ref 150–450)
RBC: 3.29 x10E6/uL — ABNORMAL LOW (ref 4.14–5.80)
RDW: 14.1 % (ref 11.6–15.4)
WBC: 7.8 10*3/uL (ref 3.4–10.8)

## 2022-01-09 LAB — BASIC METABOLIC PANEL
BUN/Creatinine Ratio: 11 (ref 10–24)
BUN: 10 mg/dL (ref 8–27)
CO2: 23 mmol/L (ref 20–29)
Calcium: 9.3 mg/dL (ref 8.6–10.2)
Chloride: 102 mmol/L (ref 96–106)
Creatinine, Ser: 0.93 mg/dL (ref 0.76–1.27)
Glucose: 125 mg/dL — ABNORMAL HIGH (ref 70–99)
Potassium: 4.8 mmol/L (ref 3.5–5.2)
Sodium: 139 mmol/L (ref 134–144)
eGFR: 86 mL/min/{1.73_m2} (ref >59)

## 2022-01-15 ENCOUNTER — Ambulatory Visit (HOSPITAL_COMMUNITY)
Admission: RE | Admit: 2022-01-15 | Discharge: 2022-01-15 | Disposition: A | Discharge status: Home / Self Care | Payer: Medicare Other | Attending: Cardiology | Admitting: Cardiology

## 2022-01-15 ENCOUNTER — Encounter (HOSPITAL_COMMUNITY)
Admission: RE | Disposition: A | Discharge status: Home / Self Care | Payer: Self-pay | Source: Home / Self Care | Attending: Cardiology

## 2022-01-15 DIAGNOSIS — Z538 Procedure and treatment not carried out for other reasons: Secondary | ICD-10-CM | POA: Insufficient documentation

## 2022-01-15 DIAGNOSIS — R001 Bradycardia, unspecified: Secondary | ICD-10-CM | POA: Insufficient documentation

## 2022-01-15 DIAGNOSIS — I4891 Unspecified atrial fibrillation: Secondary | ICD-10-CM | POA: Diagnosis present

## 2022-01-15 DIAGNOSIS — I480 Paroxysmal atrial fibrillation: Secondary | ICD-10-CM

## 2022-01-15 SURGERY — CANCELLED PROCEDURE

## 2022-01-15 MED ORDER — SODIUM CHLORIDE 0.9 % IV SOLN: INTRAVENOUS | Status: DC

## 2022-01-15 NOTE — Progress Notes (Signed)
Patient in Sinus brady on arrival. EKG confirmed, Dr Harriet Masson ordered to D/C.

## 2022-01-15 NOTE — H&P (Signed)
Patient in sinus bradycardia - procedure not performed.

## 2022-01-18 ENCOUNTER — Telehealth: Payer: Self-pay

## 2022-01-18 NOTE — Telephone Encounter (Signed)
Darryl Ward, [facility] contacted the office requesting verbal orders for patient to be seen one more time after his IBV valve removal before he is discharged. Verbal orders given. Will await faxed copy for physician signature.

## 2022-01-22 ENCOUNTER — Other Ambulatory Visit (HOSPITAL_COMMUNITY): Payer: Self-pay | Admitting: *Deleted

## 2022-01-22 NOTE — Progress Notes (Signed)
Surgical Instructions    Your procedure is scheduled on Thursday January 4th.  Report to [facility] Main Entrance "A" at 11:20 A.M., then check in with the Admitting office.  Call this number if you have problems the morning of surgery:  [phone]   If you have any questions prior to your surgery date call [phone]: Open Monday-Friday 8am-4pm If you experience any cold or flu symptoms such as cough, fever, chills, shortness of breath, etc. between now and your scheduled surgery, please notify us at the above number     Remember:  Do not eat after midnight the night before your surgery  You may drink clear liquids until 10:20 AM the morning of your surgery.   Clear liquids allowed are: Water, Non-Citrus Juices (without pulp), Carbonated Beverages, Clear Tea, Black Coffee ONLY (NO MILK, CREAM OR POWDERED CREAMER of any kind), and Gatorade    Take these medicines the morning of surgery with A SIP OF WATER:    apixaban (ELIQUIS) 5 MG (per MD instructions) atorvastatin (LIPITOR) 80 MG metoprolol succinate (TOPROL-XL) 50 MG  escitalopram (LEXAPRO) 10 MG   IF NEEDED: acetaminophen (TYLENOL) 500 MG  cyclobenzaprine (FLEXERIL) 10 MG  traMADol (ULTRAM) 50 MG   As of today, STOP taking any Aspirin (unless otherwise instructed by your surgeon) Aleve, Naproxen, Ibuprofen, Motrin, Advil, Goody's, BC's, all herbal medications, fish oil, and all vitamins.           Do not wear jewelry  Do not wear lotions, powders, cologne or deodorant. Do not shave 48 hours prior to surgery.  Men may shave face and neck. Do not bring valuables to the hospital. Do not wear nail polish  [place] is not responsible for any belongings or valuables.    Do NOT Smoke (Tobacco/Vaping)  24 hours prior to your procedure  If you use a CPAP at night, you may bring your mask for your overnight stay.   Contacts, glasses, hearing aids, dentures or partials may not be worn into surgery, please bring cases for  these belongings   For patients admitted to the hospital, discharge time will be determined by your treatment team.   Patients discharged the day of surgery will not be allowed to drive home, and someone needs to stay with them for 24 hours.   SURGICAL WAITING ROOM VISITATION Patients having surgery or a procedure may have no more than 2 support people in the waiting area - these visitors may rotate.   Children under the age of 46 must have an adult with them who is not the patient. If the patient needs to stay at the hospital during part of their recovery, the visitor guidelines for inpatient rooms apply. Pre-op nurse will coordinate an appropriate time for 1 support person to accompany patient in pre-op.  This support person may not rotate.   Please refer to [email or web address] for the visitor guidelines for Inpatients (after your surgery is over and you are in a regular room).    Special instructions:    Oral Hygiene is also important to reduce your risk of infection.  Remember - BRUSH YOUR TEETH THE MORNING OF SURGERY WITH YOUR REGULAR TOOTHPASTE   [place]- Preparing For Surgery  Before surgery, you can play an important role. Because skin is not sterile, your skin needs to be as free of germs as possible. You can reduce the number of germs on your skin by washing with CHG (chlorahexidine gluconate) Soap before surgery.  CHG is an antiseptic cleaner  which kills germs and bonds with the skin to continue killing germs even after washing.     Please do not use if you have an allergy to CHG or antibacterial soaps. If your skin becomes reddened/irritated stop using the CHG.  Do not shave (including legs and underarms) for at least 48 hours prior to first CHG shower. It is OK to shave your face.  Please follow these instructions carefully.     Shower the NIGHT BEFORE SURGERY and the MORNING OF SURGERY with CHG Soap.   If you chose  to wash your hair, wash your hair first as usual with your normal shampoo. After you shampoo, rinse your hair and body thoroughly to remove the shampoo.  Then [employer] and genitals (private parts) with your normal soap and rinse thoroughly to remove soap.  After that Use CHG Soap as you would any other liquid soap. You can apply CHG directly to the skin and wash gently with a scrungie or a clean washcloth.   Apply the CHG Soap to your body ONLY FROM THE NECK DOWN.  Do not use on open wounds or open sores. Avoid contact with your eyes, ears, mouth and genitals (private parts). Wash Face and genitals (private parts)  with your normal soap.   Wash thoroughly, paying special attention to the area where your surgery will be performed.  Thoroughly rinse your body with warm water from the neck down.  DO NOT shower/wash with your normal soap after using and rinsing off the CHG Soap.  Pat yourself dry with a CLEAN TOWEL.  Wear CLEAN PAJAMAS to bed the night before surgery  Place CLEAN SHEETS on your bed the night before your surgery  DO NOT SLEEP WITH PETS.   Day of Surgery:  Take a shower with CHG soap. Wear Clean/Comfortable clothing the morning of surgery Do not apply any deodorants/lotions.   Remember to brush your teeth WITH YOUR REGULAR TOOTHPASTE.    If you received a COVID test during your pre-op visit, it is requested that you wear a mask when out in public, stay away from anyone that may not be feeling well, and notify your surgeon if you develop symptoms. If you have been in contact with anyone that has tested positive in the last 10 days, please notify your surgeon.    Please read over the following fact sheets that you were given.

## 2022-01-23 ENCOUNTER — Encounter: Payer: Self-pay | Admitting: Hematology

## 2022-01-23 ENCOUNTER — Encounter: Payer: Self-pay | Admitting: Emergency Medicine

## 2022-01-23 ENCOUNTER — Encounter (HOSPITAL_COMMUNITY): Payer: Self-pay

## 2022-01-23 ENCOUNTER — Ambulatory Visit (HOSPITAL_COMMUNITY)
Admission: RE | Admit: 2022-01-23 | Discharge: 2022-01-23 | Disposition: A | Discharge status: Home / Self Care | Payer: Medicare Other | Source: Ambulatory Visit | Attending: Thoracic Surgery (Cardiothoracic Vascular Surgery) | Admitting: Thoracic Surgery (Cardiothoracic Vascular Surgery)

## 2022-01-23 ENCOUNTER — Other Ambulatory Visit: Payer: Self-pay

## 2022-01-23 ENCOUNTER — Ambulatory Visit (INDEPENDENT_AMBULATORY_CARE_PROVIDER_SITE_OTHER): Payer: PPO | Admitting: Emergency Medicine

## 2022-01-23 ENCOUNTER — Encounter (HOSPITAL_COMMUNITY)
Admission: RE | Admit: 2022-01-23 | Discharge: 2022-01-23 | Disposition: A | Discharge status: Home / Self Care | Payer: PPO | Source: Ambulatory Visit | Attending: Thoracic Surgery (Cardiothoracic Vascular Surgery) | Admitting: Thoracic Surgery (Cardiothoracic Vascular Surgery)

## 2022-01-23 VITALS — BP 111/58 | HR 58 | Temp 97.5°F | Resp 18 | Ht 70.0 in | Wt 157.1 lb

## 2022-01-23 VITALS — BP 96/56 | HR 73 | Temp 98.2°F | Ht 70.0 in | Wt 156.6 lb

## 2022-01-23 DIAGNOSIS — Z01812 Encounter for preprocedural laboratory examination: Secondary | ICD-10-CM | POA: Diagnosis present

## 2022-01-23 DIAGNOSIS — Z1152 Encounter for screening for COVID-19: Secondary | ICD-10-CM | POA: Diagnosis not present

## 2022-01-23 DIAGNOSIS — J9382 Other air leak: Secondary | ICD-10-CM

## 2022-01-23 DIAGNOSIS — C3411 Malignant neoplasm of upper lobe, right bronchus or lung: Secondary | ICD-10-CM | POA: Diagnosis not present

## 2022-01-23 DIAGNOSIS — J449 Chronic obstructive pulmonary disease, unspecified: Secondary | ICD-10-CM

## 2022-01-23 DIAGNOSIS — Z01818 Encounter for other preprocedural examination: Secondary | ICD-10-CM

## 2022-01-23 HISTORY — DX: Chronic obstructive pulmonary disease, unspecified: J44.9

## 2022-01-23 HISTORY — DX: Depression, unspecified: F32.A

## 2022-01-23 HISTORY — DX: Malignant (primary) neoplasm, unspecified: C80.1

## 2022-01-23 HISTORY — DX: Cardiac arrhythmia, unspecified: I49.9

## 2022-01-23 LAB — COMPREHENSIVE METABOLIC PANEL
ALT: 18 U/L (ref 0–44)
AST: 26 U/L (ref 15–41)
Albumin: 3 g/dL — ABNORMAL LOW (ref 3.5–5.0)
Alkaline Phosphatase: 52 U/L (ref 38–126)
Anion gap: 8 (ref 5–15)
BUN: 11 mg/dL (ref 8–23)
CO2: 23 mmol/L (ref 22–32)
Calcium: 8.8 mg/dL — ABNORMAL LOW (ref 8.9–10.3)
Chloride: 106 mmol/L (ref 98–111)
Creatinine, Ser: 1.04 mg/dL (ref 0.61–1.24)
GFR, Estimated: >60 mL/min (ref >60)
Glucose, Bld: 101 mg/dL — ABNORMAL HIGH (ref 70–99)
Potassium: 4.7 mmol/L (ref 3.5–5.1)
Sodium: 137 mmol/L (ref 135–145)
Total Bilirubin: 0.4 mg/dL (ref 0.3–1.2)
Total Protein: 6.2 g/dL — ABNORMAL LOW (ref 6.5–8.1)

## 2022-01-23 LAB — CBC
HCT: 32.1 % — ABNORMAL LOW (ref 39.0–52.0)
Hemoglobin: 10.3 g/dL — ABNORMAL LOW (ref 13.0–17.0)
MCH: 32 pg (ref 26.0–34.0)
MCHC: 32.1 g/dL (ref 30.0–36.0)
MCV: 99.7 fL (ref 80.0–100.0)
Platelets: 140 10*3/uL — ABNORMAL LOW (ref 150–400)
RBC: 3.22 MIL/uL — ABNORMAL LOW (ref 4.22–5.81)
RDW: 15.2 % (ref 11.5–15.5)
WBC: 7.2 10*3/uL (ref 4.0–10.5)
nRBC: 0 % (ref 0.0–0.2)

## 2022-01-23 LAB — SARS CORONAVIRUS 2 BY RT PCR: SARS Coronavirus 2 by RT PCR: NEGATIVE

## 2022-01-23 LAB — PROTIME-INR
INR: 1.3 — ABNORMAL HIGH (ref 0.8–1.2)
Prothrombin Time: 16.5 seconds — ABNORMAL HIGH (ref 11.4–15.2)

## 2022-01-23 LAB — APTT: aPTT: 31 seconds (ref 24–36)

## 2022-01-23 MED ORDER — SPIRIVA RESPIMAT 2.5 MCG/ACT IN AERS: 2.0000 | INHALATION_SPRAY | Freq: Every day | RESPIRATORY_TRACT | 0 refills | Status: DC

## 2022-01-23 MED ORDER — ALBUTEROL SULFATE HFA 108 (90 BASE) MCG/ACT IN AERS
2.0000 | INHALATION_SPRAY | Freq: Four times a day (QID) | RESPIRATORY_TRACT | 6 refills | Status: DC | PRN
  Filled 2022-02-08 – 2022-06-06 (×2): qty 6.7, 25d supply, fill #0
  Filled 2022-06-21 – 2022-09-05 (×2): qty 6.7, 25d supply, fill #1

## 2022-01-23 NOTE — Progress Notes (Signed)
PCP - Sharlyne Cai NP Cardiologist - Minus Breeding MD  PPM/ICD - denies Device Orders -  Rep Notified -   Chest x-ray - 01/23/22 EKG - 01/15/22 Stress Test - 12/30/11 ECHO - 09/20/21 Cardiac Cath - 2007  Sleep Study - denies CPAP -   Fasting Blood Sugar - na Checks Blood Sugar _____ times a day  Last dose of GLP1 agonist-  na GLP1 instructions:   Blood Thinner Instructions: Aspirin Instructions:na  ERAS Protcol -clear liquids until 1020 PRE-SURGERY Ensure or G2- no  COVID TEST- 01/23/22   Anesthesia review: yes-cardiac history- request sent by secure chat to Karoline Caldwell and Myra Gianotti.  Patient denies shortness of breath, fever, cough and chest pain at PAT appointment   All instructions explained to the patient, with a verbal understanding of the material. Patient agrees to go over the instructions while at home for a better understanding. Patient also instructed to wear a mask when out in public  after being tested for COVID-19. The opportunity to ask questions was provided.

## 2022-01-23 NOTE — Assessment & Plan Note (Signed)
Significant exertional dyspnea even with household tasks.  Minimal cough, minimal sputum.  Is not on any bronchodilator regimen and I think he would benefit.  Need to be mindful of the fact that he has labile atrial fibrillation.  Cautioned him regarding any associated A-fib or tachycardia on BD therapy.  We will plan to start Spiriva Respimat, give him an albuterol inhaler to use if needed.

## 2022-01-23 NOTE — Progress Notes (Signed)
,  Subjective:    Patient ID: Darryl Ward, male    DOB: 1945/02/17, 77 y.o.   MRN: [id number]  HPI 77 year old active smoker (60 pack years) with a history of CAD/PTCI CVA, AAA, hypertension and hyperlipidemia.  He participates in the lung cancer screening program and had reassuring CT scans through 11/2019.  He is referred now for evaluation of an abnormal scan performed on 04/09/2021 as below. He is on Trelegy, has daily cough. No albuterol use. Some minimal exertional SOB.   Lung cancer screening CT 04/09/2021 reviewed by me shows some emphysematous change, peripheral reticulation bilaterally, base predominant.  There is a 12.7 mm mixed density nodule that has increased in size from 5.5 mm on his prior study in 2021.  No significant mediastinal or hilar adenopathy  [name] 07/17/21 --pleasant 77 year old active smoker with history of an abnormal CT scan of the chest.  He underwent navigational bronchoscopy 06/11/2021 to evaluate right upper lobe nodule.  Was found to be adenocarcinoma.  PET scan shows hypermetabolic right hilar node.  Stage IIb and potentially resectable if he is treated with neoadjuvant chemotherapy.  He has seen Dr. Roxan Hockey to talk about this and is contemplating the options.  Also following with Dr. Delton Coombes.  [name] 01/23/22 --follow-up visit for [name].  He is 63 with history of tobacco use and COPD, CAD, CVA, AAA, hypertension and hyperlipidemia.  I saw him for right upper lobe pulmonary nodule noted on lung cancer screening, clinical stage IIb.  Diagnosed with adenocarcinoma.  He underwent neoadjuvant chemotherapy and then robotic assisted right upper lobe lobectomy.  He had a prolonged air leak and had endobronchial valves placed on 11/22/2021.  He no longer has a chest tube.  Planning for endobronchial valve removal with Dr. Roxan Hockey on 1//24.  Today he reports that he gets some exertional SOB, ? sometimes associated w his A Fib. He coughs rarely, no sputum.     Review  of Systems As per HPi   Past Medical History:  Diagnosis Date   AAA (abdominal aortic aneurysm) ([place])    Arthritis    Cancer ([place])    Carotid artery disease ([place])    Nonobstructive   Cataract    COPD (chronic obstructive pulmonary disease) (H[facility])    Coronary atherosclerosis of native coronary artery    PTCA small diagonal 2007 otherwise nonobstructive CAD   Depression    Dysrhythmia    Essential hypertension, benign    Hyperlipidemia    NSTEMI (non-ST elevated myocardial infarction) ([place]) 2007   Stroke [facility]) 2004   TIA (transient ischemic attack) 2006     Family History  Problem Relation Age of Onset   Hyperlipidemia Sister    Heart attack Brother 56   Cancer - Colon Neg Hx      Social History   Socioeconomic History   Marital status: Divorced    Spouse name: Not on file   Number of children: 1   Years of education: 11   Highest education level: 11th grade  Occupational History    Employer: Probation officer  Tobacco Use   Smoking status: Former    Packs/day: 1.00    Years: 40.00    Total pack years: 40.00    Types: Cigarettes    Quit date: 07/2021    Years since quitting: 0.5   Smokeless tobacco: Never   Tobacco comments:    1 pack of cigarettes smoked daily. 07/17/21 ARJ, RN   Vaping Use   Vaping Use: Never used  Substance and Sexual Activity   Alcohol use: No    Comment: Prior history of regular alcohol use   Drug use: No   Sexual activity: Not Currently  Other Topics Concern   Not on file  Social History Narrative   Not on file   Social Determinants of Health   Financial Resource Strain: Low Risk  (11/29/2019)   Overall Financial Resource Strain (CARDIA)    Difficulty of Paying Living Expenses: Not hard at all  Food Insecurity: No Food Insecurity (11/29/2021)   Hunger Vital Sign    Worried About Running Out of Food in the Last Year: Never true    Ran Out of Food in the Last Year: Never true  Transportation Needs: No Transportation Needs  (11/29/2021)   PRAPARE - Hydrologist (Medical): No    Lack of Transportation (Non-Medical): No  Physical Activity: Inactive (11/29/2019)   Exercise Vital Sign    Days of Exercise per Week: 0 days    Minutes of Exercise per Session: 0 min  Stress: No Stress Concern Present (11/29/2019)   [place]    Feeling of Stress : Only a little  Social Connections: Moderately Isolated (11/29/2019)   Social Connection and Isolation Panel [NHANES]    Frequency of Communication with Friends and Family: More than three times a week    Frequency of Social Gatherings with Friends and Family: More than three times a week    Attends Religious Services: 1 to 4 times per year    Active Member of [employer] or Organizations: No    Attends Archivist Meetings: Never    Marital Status: Divorced  Human resources officer Violence: Not At Risk (11/13/2021)   Humiliation, Afraid, Rape, and Kick questionnaire    Fear of Current or Ex-Partner: No    Emotionally Abused: No    Physically Abused: No    Sexually Abused: No     No Known Allergies   Outpatient Medications Prior to Visit  Medication Sig Dispense Refill   acetaminophen (TYLENOL) 500 MG tablet Take 2 tablets (1,000 mg total) by mouth every 6 (six) hours as needed. (Patient taking differently: Take 1,000 mg by mouth 2 (two) times daily as needed for moderate pain.) 30 tablet 0   apixaban (ELIQUIS) 5 MG TABS tablet Take 1 tablet (5 mg total) by mouth 2 (two) times daily. 180 tablet 1   atorvastatin (LIPITOR) 80 MG tablet Take 80 mg by mouth daily.     cyclobenzaprine (FLEXERIL) 10 MG tablet Take 10 mg by mouth 2 (two) times daily as needed for muscle spasms.     escitalopram (LEXAPRO) 10 MG tablet Take 1 tablet (10 mg total) by mouth daily. 30 tablet 3   folic acid (FOLVITE) 1 MG tablet Take 1 tablet (1 mg total) by mouth daily. 15 tablet 0   furosemide (LASIX) 20  MG tablet Take 1 tablet (20 mg total) by mouth daily as needed. (Patient taking differently: Take 20 mg by mouth in the morning.) 90 tablet 2   Magnesium Oxide 400 MG CAPS Take 1 capsule (400 mg total) by mouth daily.  0   metoprolol succinate (TOPROL-XL) 50 MG 24 hr tablet Take 1.5 tablets (75 mg total) by mouth daily. Take with or immediately following a meal. 135 tablet 3   traMADol (ULTRAM) 50 MG tablet Take 1 tablet (50 mg total) by mouth every 6 (six) hours as needed (mild pain). [place]  tablet 0   No facility-administered medications prior to visit.         Objective:   Physical Exam Vitals:   01/23/22 1509  BP: (!) 96/56  Pulse: 73  Temp: 98.2 F (36.8 C)  TempSrc: Oral  SpO2: 96%  Weight: 156 lb 9.6 oz (71 kg)  Height: 5\' 10"  (1.778 m)    Gen: Pleasant, well-nourished, chronically ill-appearing man, in no distress,  normal affect  ENT: No lesions,  mouth clear,  oropharynx clear, no postnasal drip, hoarse voice  Neck: No JVD, no stridor  Lungs: No use of accessory muscles, no crackles or wheezing on normal respiration, end expiratory wheezing on forced expiration  Cardiovascular: RRR, heart sounds normal, no murmur or gallops, trace left ankle peripheral edema  Musculoskeletal: No deformities, no cyanosis or clubbing  Neuro: alert, awake, non focal  Skin: Warm, no lesions or rash      Assessment & Plan:  COPD (chronic obstructive pulmonary disease) (HCC) Significant exertional dyspnea even with household tasks.  Minimal cough, minimal sputum.  Is not on any bronchodilator regimen and I think he would benefit.  Need to be mindful of the fact that he has labile atrial fibrillation.  Cautioned him regarding any associated A-fib or tachycardia on BD therapy.  We will plan to start Spiriva Respimat, give him an albuterol inhaler to use if needed.  Primary adenocarcinoma of upper lobe of right lung ([place]) Underwent neoadjuvant chemotherapy and then robotic lobectomy by  Dr. Roxan Hockey.  He had a prolonged air leak, chest tube is now out.  He is planning to undergo removal of his endobronchial valves on 01/24/2022.  Continued imaging and follow-up with Dr. Roxan Hockey and Dr. Delton Coombes.   Baltazar Apo, MD, PhD 01/23/2022, 3:42 PM [facility] [phone] or if no answer before 7:00PM call [phone] For any issues after 7:00PM please call eLink [phone]

## 2022-01-23 NOTE — Patient Instructions (Signed)
We will try starting Spiriva Respimat 2 puffs once daily. We will give you a prescription for an albuterol inhaler.  You can use 2 puffs up to every 4 hours if needed for shortness of breath, chest tightness, wheezing. Continue to follow with Dr. Roxan Hockey and Dr. Delton Coombes as planned. Follow with APP in 6 months.  Follow Dr. Lamonte Sakai in 12 months or sooner if you have any problems.

## 2022-01-23 NOTE — Progress Notes (Signed)
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  Patient was seen by Nicholes Rough, PA-C on 12/27/2021 and noted to be back in A-fib with ventricular rate of 130.  His metoprolol was increased and he was scheduled for repeat DCCV.  Patient presented for DCCV on 01/15/2022 but was found to be in sinus bradycardia and procedure was canceled.  Former smoker with associated COPD.  60-pack-year history, quit ~6 months ago.  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 Postoperative course was complicated by a prolonged air leak requiring IBV placement on 11/22/2021.   Per CT surgery, patient does not need to hold Eliquis for this procedure.  Preop labs reviewed, mild anemia with hemoglobin 10.3, mild thrombocytopenia with platelets 140k, otherwise unremarkable.  EKG 01/15/2022: Sinus bradycardia with PAC.  Rate 53.  CHEST - 2 VIEW 12/27/21: COMPARISON:  Chest two views 12/12/2021, 12/11/2021, 12/04/2021; CT chest 10/08/2021; chest two views 11/12/2021   FINDINGS: Left chest wall porta catheter tip again overlies the superior vena cava/right atrial junction. Cardiac silhouette and mediastinal contours are within normal limits. There is again mild to moderate right hemithorax volume loss with associated tenting of the right hemidiaphragm. Unchanged mild-to-moderate superior right pneumothorax with layering fluid indicating hydropneumothorax. This measures up to approximately 5.7 cm in craniocaudal dimension. Interval resolution of the prior bilateral chest wall subcutaneous air.   Mild dextrocurvature of the midthoracic spine with mild  multilevel degenerative disc changes.   IMPRESSION: 1. Unchanged mild-to-moderate right hydropneumothorax status post partial right lung resection. 2. The lungs are otherwise clear. 3. Interval resolution of the prior bilateral chest wall subcutaneous air.  TEE 09/20/2021:  1. Limited study in terms of color Doppler and zooming capabilies of  echocardiographic work station.   2. Left ventricular ejection fraction, by estimation, is 30 to 35%. The  left ventricle has moderately decreased function. The left ventricle  demonstrates global hypokinesis.   3. Right ventricular systolic function is mildly reduced. The right  ventricular size is normal.   4. Left atrial size was moderately dilated. No left atrial/left atrial  appendage thrombus was detected.   5. Right atrial size was moderately dilated.   6. The mitral valve is grossly normal. Mild mitral valve regurgitation.   7. Tricuspid valve regurgitation is mild to moderate.   8. The aortic valve is tricuspid. Aortic valve regurgitation is not  visualized.   9. There is Moderate (Grade III) atheroma plaque involving the descending  aorta.     [place]ynonia Musty [facility] Short Stay Center/Anesthesiology Phone [phone] 01/23/2022 2:19 PM

## 2022-01-23 NOTE — Anesthesia Preprocedure Evaluation (Signed)
Anesthesia Evaluation  Patient identified by MRN, date of birth, ID band Patient awake    Reviewed: Allergy & Precautions, H&P , NPO status , Patient's Chart, lab work & pertinent test results  History of Anesthesia Complications Negative for: history of anesthetic complications  Airway Mallampati: III  TM Distance: >3 FB Neck ROM: Full    Dental  (+) Edentulous Upper, Edentulous Lower   Pulmonary COPD, former smoker RUL adeno CA    + decreased breath sounds      Cardiovascular hypertension, + CAD, + Past MI, + Peripheral Vascular Disease and +CHF  + dysrhythmias Atrial Fibrillation + Valvular Problems/Murmurs (mild-mod TR) MR  Rhythm:Regular  Echo 2023 1. Limited study in terms of color Doppler and zooming capabilies of echocardiographic work station.  2. Left ventricular ejection fraction, by estimation, is 30 to 35%. The left ventricle has moderately decreased function. The left ventricle demonstrates global hypokinesis.  3. Right ventricular systolic function is mildly reduced. The right ventricular size is normal.  4. Left atrial size was moderately dilated. No left atrial/left atrial appendage thrombus was detected.  5. Right atrial size was moderately dilated.  6. The mitral valve is grossly normal. Mild mitral valve regurgitation.  7. Tricuspid valve regurgitation is mild to moderate.  8. The aortic valve is tricuspid. Aortic valve regurgitation is not visualized.  9. There is Moderate (Grade III) atheroma plaque involving the descending aorta.    Neuro/Psych TIACVA    GI/Hepatic negative GI ROS, Neg liver ROS,,,  Endo/Other    Renal/GU Renal disease     Musculoskeletal  (+) Arthritis ,    Abdominal   Peds  Hematology  (+) Blood dyscrasia, anemia Lab Results      Component                Value               Date                      WBC                      7.2                 01/23/2022                 HGB                      10.3 (L)            01/23/2022                HCT                      32.1 (L)            01/23/2022                MCV                      99.7                01/23/2022                PLT                      140 (L)             01/23/2022  Anesthesia Other Findings   Reproductive/Obstetrics                             Anesthesia Physical Anesthesia Plan  ASA: 3  Anesthesia Plan: General   Post-op Pain Management: Minimal or no pain anticipated   Induction: Intravenous  PONV Risk Score and Plan: 2 and Ondansetron, Dexamethasone, Treatment may vary due to age or medical condition, Propofol infusion and TIVA  Airway Management Planned: Oral ETT  Additional Equipment: None  Intra-op Plan:   Post-operative Plan: Extubation in OR  Informed Consent: I have reviewed the patients History and Physical, chart, labs and discussed the procedure including the risks, benefits and alternatives for the proposed anesthesia with the patient or authorized representative who has indicated his/her understanding and acceptance.     Dental advisory given  Plan Discussed with: CRNA  Anesthesia Plan Comments:         Anesthesia Quick Evaluation

## 2022-01-23 NOTE — Assessment & Plan Note (Signed)
Underwent neoadjuvant chemotherapy and then robotic lobectomy by Dr. Roxan Hockey.  He had a prolonged air leak, chest tube is now out.  He is planning to undergo removal of his endobronchial valves on 01/24/2022.  Continued imaging and follow-up with Dr. Roxan Hockey and Dr. Delton Coombes.

## 2022-01-24 ENCOUNTER — Encounter (HOSPITAL_COMMUNITY)
Admission: RE | Disposition: A | Discharge status: Home / Self Care | Payer: Self-pay | Source: Home / Self Care | Attending: Thoracic Surgery (Cardiothoracic Vascular Surgery)

## 2022-01-24 ENCOUNTER — Encounter (HOSPITAL_COMMUNITY): Payer: Self-pay | Admitting: Thoracic Surgery (Cardiothoracic Vascular Surgery)

## 2022-01-24 ENCOUNTER — Other Ambulatory Visit: Payer: Self-pay

## 2022-01-24 ENCOUNTER — Ambulatory Visit (HOSPITAL_COMMUNITY): Payer: PPO | Admitting: Physician Assistant

## 2022-01-24 ENCOUNTER — Ambulatory Visit (HOSPITAL_BASED_OUTPATIENT_CLINIC_OR_DEPARTMENT_OTHER): Payer: PPO | Admitting: Physician Assistant

## 2022-01-24 ENCOUNTER — Ambulatory Visit (HOSPITAL_COMMUNITY)
Admission: RE | Admit: 2022-01-24 | Discharge: 2022-01-24 | Disposition: A | Discharge status: Home / Self Care | Payer: PPO | Attending: Thoracic Surgery (Cardiothoracic Vascular Surgery) | Admitting: Thoracic Surgery (Cardiothoracic Vascular Surgery)

## 2022-01-24 DIAGNOSIS — J9382 Other air leak: Secondary | ICD-10-CM

## 2022-01-24 DIAGNOSIS — Z8673 Personal history of transient ischemic attack (TIA), and cerebral infarction without residual deficits: Secondary | ICD-10-CM | POA: Insufficient documentation

## 2022-01-24 DIAGNOSIS — I252 Old myocardial infarction: Secondary | ICD-10-CM | POA: Diagnosis not present

## 2022-01-24 DIAGNOSIS — I2510 Atherosclerotic heart disease of native coronary artery without angina pectoris: Secondary | ICD-10-CM | POA: Insufficient documentation

## 2022-01-24 DIAGNOSIS — C3411 Malignant neoplasm of upper lobe, right bronchus or lung: Secondary | ICD-10-CM | POA: Diagnosis not present

## 2022-01-24 DIAGNOSIS — J449 Chronic obstructive pulmonary disease, unspecified: Secondary | ICD-10-CM

## 2022-01-24 DIAGNOSIS — I110 Hypertensive heart disease with heart failure: Secondary | ICD-10-CM | POA: Diagnosis not present

## 2022-01-24 DIAGNOSIS — Z87891 Personal history of nicotine dependence: Secondary | ICD-10-CM | POA: Diagnosis not present

## 2022-01-24 DIAGNOSIS — I739 Peripheral vascular disease, unspecified: Secondary | ICD-10-CM | POA: Diagnosis not present

## 2022-01-24 DIAGNOSIS — Z4689 Encounter for fitting and adjustment of other specified devices: Secondary | ICD-10-CM | POA: Insufficient documentation

## 2022-01-24 DIAGNOSIS — I4891 Unspecified atrial fibrillation: Secondary | ICD-10-CM | POA: Diagnosis not present

## 2022-01-24 DIAGNOSIS — I509 Heart failure, unspecified: Secondary | ICD-10-CM | POA: Insufficient documentation

## 2022-01-24 DIAGNOSIS — M1990 Unspecified osteoarthritis, unspecified site: Secondary | ICD-10-CM | POA: Insufficient documentation

## 2022-01-24 HISTORY — PX: VIDEO BRONCHOSCOPY WITH INSERTION OF INTERBRONCHIAL VALVE (IBV): SHX6178

## 2022-01-24 SURGERY — BRONCHOSCOPY, FLEXIBLE, WITH INTRABRONCHIAL VALVE INSERTION
Anesthesia: General

## 2022-01-24 MED ORDER — EPINEPHRINE PF 1 MG/ML IJ SOLN
INTRAMUSCULAR | Status: DC | PRN
  Administered 2022-01-24: 1 mg via ENDOTRACHEOPULMONARY

## 2022-01-24 MED ORDER — FENTANYL CITRATE (PF) 100 MCG/2ML IJ SOLN: 25.0000 ug | INTRAMUSCULAR | Status: DC | PRN

## 2022-01-24 MED ORDER — FENTANYL CITRATE (PF) 250 MCG/5ML IJ SOLN
INTRAMUSCULAR | Status: DC | PRN
  Administered 2022-01-24: 25 ug via INTRAVENOUS
  Administered 2022-01-24: 50 ug via INTRAVENOUS
  Administered 2022-01-24: 75 ug via INTRAVENOUS

## 2022-01-24 MED ORDER — PHENYLEPHRINE 80 MCG/ML (10ML) SYRINGE FOR IV PUSH (FOR BLOOD PRESSURE SUPPORT)
PREFILLED_SYRINGE | INTRAVENOUS | Status: AC
  Filled 2022-01-24: qty 10

## 2022-01-24 MED ORDER — LIDOCAINE 2% (20 MG/ML) 5 ML SYRINGE
INTRAMUSCULAR | Status: AC
  Filled 2022-01-24: qty 5

## 2022-01-24 MED ORDER — PROPOFOL 500 MG/50ML IV EMUL
INTRAVENOUS | Status: DC | PRN
  Administered 2022-01-24: 50 ug/kg/min via INTRAVENOUS
  Administered 2022-01-24: 100 ug/kg/min via INTRAVENOUS

## 2022-01-24 MED ORDER — FENTANYL CITRATE (PF) 250 MCG/5ML IJ SOLN
INTRAMUSCULAR | Status: AC
  Filled 2022-01-24: qty 5

## 2022-01-24 MED ORDER — ACETAMINOPHEN 500 MG PO TABS: 1000.0000 mg | ORAL_TABLET | Freq: Once | ORAL | Status: DC | PRN

## 2022-01-24 MED ORDER — ORAL CARE MOUTH RINSE: 15.0000 mL | Freq: Once | OROMUCOSAL | Status: AC

## 2022-01-24 MED ORDER — SUCCINYLCHOLINE CHLORIDE 200 MG/10ML IV SOSY
PREFILLED_SYRINGE | INTRAVENOUS | Status: DC | PRN
  Administered 2022-01-24: 160 mg via INTRAVENOUS

## 2022-01-24 MED ORDER — PROPOFOL 10 MG/ML IV BOLUS
INTRAVENOUS | Status: AC
  Filled 2022-01-24: qty 20

## 2022-01-24 MED ORDER — LACTATED RINGERS IV SOLN: INTRAVENOUS | Status: DC

## 2022-01-24 MED ORDER — ROCURONIUM BROMIDE 10 MG/ML (PF) SYRINGE
PREFILLED_SYRINGE | INTRAVENOUS | Status: AC
  Filled 2022-01-24: qty 10

## 2022-01-24 MED ORDER — EPINEPHRINE PF 1 MG/ML IJ SOLN
INTRAMUSCULAR | Status: AC
  Filled 2022-01-24: qty 1

## 2022-01-24 MED ORDER — CHLORHEXIDINE GLUCONATE 0.12 % MT SOLN: 15.0000 mL | Freq: Once | OROMUCOSAL | Status: AC

## 2022-01-24 MED ORDER — CHLORHEXIDINE GLUCONATE 0.12 % MT SOLN
OROMUCOSAL | Status: AC
  Administered 2022-01-24: 15 mL via OROMUCOSAL
  Filled 2022-01-24: qty 15

## 2022-01-24 MED ORDER — ACETAMINOPHEN 10 MG/ML IV SOLN: 1000.0000 mg | Freq: Once | INTRAVENOUS | Status: DC | PRN

## 2022-01-24 MED ORDER — ONDANSETRON HCL 4 MG/2ML IJ SOLN
INTRAMUSCULAR | Status: AC
  Filled 2022-01-24: qty 2

## 2022-01-24 MED ORDER — 0.9 % SODIUM CHLORIDE (POUR BTL) OPTIME
TOPICAL | Status: DC | PRN
  Administered 2022-01-24: 1000 mL

## 2022-01-24 MED ORDER — PHENYLEPHRINE 80 MCG/ML (10ML) SYRINGE FOR IV PUSH (FOR BLOOD PRESSURE SUPPORT)
PREFILLED_SYRINGE | INTRAVENOUS | Status: DC | PRN
  Administered 2022-01-24: 80 ug via INTRAVENOUS

## 2022-01-24 MED ORDER — DEXAMETHASONE SODIUM PHOSPHATE 10 MG/ML IJ SOLN
INTRAMUSCULAR | Status: AC
  Filled 2022-01-24: qty 1

## 2022-01-24 MED ORDER — EPHEDRINE SULFATE (PRESSORS) 50 MG/ML IJ SOLN
INTRAMUSCULAR | Status: DC | PRN
  Administered 2022-01-24 (×2): 5 mg via INTRAVENOUS

## 2022-01-24 MED ORDER — PROPOFOL 10 MG/ML IV BOLUS
INTRAVENOUS | Status: DC | PRN
  Administered 2022-01-24 (×2): 20 mg via INTRAVENOUS
  Administered 2022-01-24: 30 mg via INTRAVENOUS
  Administered 2022-01-24: 90 mg via INTRAVENOUS

## 2022-01-24 MED ORDER — LIDOCAINE 2% (20 MG/ML) 5 ML SYRINGE
INTRAMUSCULAR | Status: DC | PRN
  Administered 2022-01-24: 40 mg via INTRAVENOUS

## 2022-01-24 MED ORDER — DEXAMETHASONE SODIUM PHOSPHATE 10 MG/ML IJ SOLN
INTRAMUSCULAR | Status: DC | PRN
  Administered 2022-01-24: 10 mg via INTRAVENOUS

## 2022-01-24 MED ORDER — ACETAMINOPHEN 160 MG/5ML PO SOLN: 1000.0000 mg | Freq: Once | ORAL | Status: DC | PRN

## 2022-01-24 MED ORDER — ONDANSETRON HCL 4 MG/2ML IJ SOLN
INTRAMUSCULAR | Status: DC | PRN
  Administered 2022-01-24: 4 mg via INTRAVENOUS

## 2022-01-24 SURGICAL SUPPLY — 39 items
ADAPTER VALVE BIOPSY EBUS (MISCELLANEOUS) IMPLANT
ADPTR VALVE BIOPSY EBUS (MISCELLANEOUS)
BLADE CLIPPER SURG (BLADE) ×1 IMPLANT
CANISTER SUCT 3000ML PPV (MISCELLANEOUS) ×1 IMPLANT
CNTNR URN SCR LID CUP LEK RST (MISCELLANEOUS) ×1 IMPLANT
CONT SPEC 4OZ STRL OR WHT (MISCELLANEOUS) ×1
COVER BACK TABLE 60X90IN (DRAPES) ×1 IMPLANT
FILTER STRAW FLUID ASPIR (MISCELLANEOUS) IMPLANT
FORCEPS BIOP RJ4 1.8 (CUTTING FORCEPS) IMPLANT
FORCEPS BIOP SPYBITE 1.2X286 (FORCEP) IMPLANT
FORCEPS RADIAL JAW LRG 4 PULM (INSTRUMENTS) IMPLANT
GAUZE 4X4 16PLY ~~LOC~~+RFID DBL (SPONGE) ×1 IMPLANT
GAUZE SPONGE 4X4 12PLY STRL (GAUZE/BANDAGES/DRESSINGS) ×1 IMPLANT
GLOVE SS BIOGEL STRL SZ 7.5 (GLOVE) ×1 IMPLANT
GLOVE SURG SIGNA 7.5 PF LTX (GLOVE) ×1 IMPLANT
GOWN STRL REUS W/ TWL XL LVL3 (GOWN DISPOSABLE) ×1 IMPLANT
GOWN STRL REUS W/TWL XL LVL3 (GOWN DISPOSABLE) ×1
KIT CLEAN ENDO COMPLIANCE (KITS) ×1 IMPLANT
KIT TURNOVER KIT B (KITS) ×1 IMPLANT
MARKER SKIN DUAL TIP RULER LAB (MISCELLANEOUS) ×1 IMPLANT
NS IRRIG 1000ML POUR BTL (IV SOLUTION) ×1 IMPLANT
OIL SILICONE PENTAX (PARTS (SERVICE/REPAIRS)) IMPLANT
PAD ARMBOARD 7.5X6 YLW CONV (MISCELLANEOUS) ×2 IMPLANT
SNARE SHORT THROW 13M SML OVAL (MISCELLANEOUS) IMPLANT
SPONGE T-LAP 18X18 ~~LOC~~+RFID (SPONGE) ×4 IMPLANT
SPONGE T-LAP 4X18 ~~LOC~~+RFID (SPONGE) ×1 IMPLANT
STOPCOCK MORSE 400PSI 3WAY (MISCELLANEOUS) ×1 IMPLANT
SYR 10ML LL (SYRINGE) ×1 IMPLANT
SYR 20ML ECCENTRIC (SYRINGE) ×1 IMPLANT
TOWEL GREEN STERILE (TOWEL DISPOSABLE) ×1 IMPLANT
TOWEL GREEN STERILE FF (TOWEL DISPOSABLE) ×1 IMPLANT
TOWEL NATURAL 4PK STERILE (DISPOSABLE) ×1 IMPLANT
TRAP SPECIMEN MUCUS 40CC (MISCELLANEOUS) ×1 IMPLANT
TUBE CONNECTING 20X1/4 (TUBING) ×1 IMPLANT
UNDERPAD 30X36 HEAVY ABSORB (UNDERPADS AND DIAPERS) ×1 IMPLANT
VALVE BIOPSY  SINGLE USE (MISCELLANEOUS) ×2
VALVE BIOPSY SINGLE USE (MISCELLANEOUS) ×1 IMPLANT
VALVE SUCTION BRONCHIO DISP (MISCELLANEOUS) ×1 IMPLANT
WATER STERILE IRR 1000ML POUR (IV SOLUTION) ×1 IMPLANT

## 2022-01-24 NOTE — Brief Op Note (Addendum)
01/24/2022  1:48 PM  PATIENT:  Kelby Aline Duey  77 y.o. male  PRE-OPERATIVE DIAGNOSIS:  IBV  POST-OPERATIVE DIAGNOSIS:  IBV  PROCEDURE:  Procedure(s): VIDEO BRONCHOSCOPY WITH REMOVAL OF INTERBRONCHIAL VALVE (IBV) (N/A)  SURGEON:  Surgeon(s) and Role:    * [name], MD - Primary  PHYSICIAN ASSISTANT:   ASSISTANTS: none   ANESTHESIA:   general  EBL: minimal  BLOOD ADMINISTERED:none  DRAINS: none   LOCAL MEDICATIONS USED:  NONE  SPECIMEN:  Source of Specimen:  IBV  DISPOSITION OF SPECIMEN:  N/A  COUNTS:  NO endo  TOURNIQUET:  * No tourniquets in log *  DICTATION: .Other Dictation: Dictation Number - #[id number]  PLAN OF CARE: Discharge to home after PACU  PATIENT DISPOSITION:  PACU - hemodynamically stable.   Delay start of Pharmacological VTE agent (>24hrs) due to surgical blood loss or risk of bleeding: not applicable

## 2022-01-24 NOTE — Interval H&P Note (Signed)
History and Physical Interval Note:  01/24/2022 12:01 PM  Darryl Ward  has presented today for surgery, with the diagnosis of IBV.  The various methods of treatment have been discussed with the patient and family. After consideration of risks, benefits and other options for treatment, the patient has consented to  Procedure(s): VIDEO BRONCHOSCOPY WITH REMOVAL OF INTERBRONCHIAL VALVE (IBV) (N/A) as a surgical intervention.  The patient's history has been reviewed, patient examined, no change in status, stable for surgery.  I have reviewed the patient's chart and labs.  Questions were answered to the patient's satisfaction.     [name]

## 2022-01-24 NOTE — Transfer of Care (Signed)
Immediate Anesthesia Transfer of Care Note  Patient: Darryl Ward  Procedure(s) Performed: VIDEO BRONCHOSCOPY WITH REMOVAL OF INTERBRONCHIAL VALVE (IBV)  Patient Location: PACU  Anesthesia Type:General  Level of Consciousness: awake  Airway & Oxygen Therapy: Patient Spontanous Breathing  Post-op Assessment: Report given to RN and Post -op Vital signs reviewed and stable  Post vital signs: Reviewed and stable  Last Vitals:  Vitals Value Taken Time  BP 126/76   Temp    Pulse    Resp    SpO2      Last Pain:  Vitals:   01/24/22 1147  PainSc: 0-No pain         Complications: No notable events documented.

## 2022-01-24 NOTE — Discharge Instructions (Addendum)
Do not drive or engage in heavy physical activity for 24 hours.  You may resume normal activities tomorrow.  You may use acetaminophen (Tylenol) or tramadol if needed for discomfort.  You will likely cough up small amounts of blood over the next few days.  You may use an over the counter cough medication of your choice if needed.  Resume Eliquis Saturday morning as long as you are not coughing up blood.   Call [phone] if you cough up more than a tablespoon of blood or develop chest pain or shortness of breath.  My office will contact you with a follow up appointment.

## 2022-01-24 NOTE — Op Note (Signed)
NAMEJERVON, Darryl Ward MEDICAL RECORD NO: [id number] ACCOUNT NO: [id number] DATE OF BIRTH: 1946/01/20 FACILITY: [facility] LOCATION: [facility] PHYSICIAN: Revonda Standard. Roxan Hockey, MD  Operative Report   DATE OF PROCEDURE: 01/24/2022  PREOPERATIVE DIAGNOSIS:  Intrabronchial valves in place for persistent air leak.  POSTOPERATIVE DIAGNOSIS:  Intrabronchial valves in place for persistent air leak.  PROCEDURE:  Video bronchoscopy for removal of intrabronchial valves x2.  SURGEON:  Revonda Standard. Roxan Hockey, MD  ASSISTANT:  None.  ANESTHESIA:  General.  FINDINGS:  Valves in place in right middle lobe airways.  Lateral segmental valve easily removed.  Difficult to access medial segmental valve but ultimately removed intact.  Right upper lobe bronchial stump well healed.  CLINICAL NOTE:  Darryl Ward is a 77 year old man who had undergone a right upper lobectomy complicated by prolonged air leak requiring intrabronchial valve placement on 11/22/2021.  He now presents for removal of the intrabronchial valves.  The  indications, risks, benefits, and alternatives were discussed in detail with the patient.  He understood and accepted the risks and agreed to proceed.  DESCRIPTION OF PROCEDURE:  Darryl Ward was brought to the operating room on 01/24/2022.  He was anesthetized and intubated.  Sequential compression devices were placed for DVT prophylaxis.  A Bair Hugger was placed for active warming.  A timeout was  performed.  Flexible fiberoptic bronchoscopy was performed via the endotracheal tube.  The right upper lobe bronchial stump was well healed.  There was otherwise normal endobronchial anatomy of the right lower lobe and on the left side. In the right  middle lobe there was some narrowing of the origin of the right middle lobe bronchus.  Once the scope was advanced past this point, the valves were visible.  There were valves in both the medial and lateral segmental bronchi. A biopsy forceps was used to   grasp the valve in the lateral segmental bronchus, which was easily removed.  It was difficult to get orientation of the biopsy forceps with the stem of the valve in the medial segmental bronchus.  In fact, I was never able to do so despite multiple  attempts, a smaller scope was used and smaller forceps, but still the angle would not allow grasping of the stem. An attempt was made to snare the stem, but that was also unsuccessful.  Ultimately, the biopsy forceps was able to be advanced into the  airway beyond the valve and then withdrawn dislodging the valve, which was then grasped and removed.  The bronchoscope was reinserted.  There was some bleeding and a dilute epinephrine solution was applied topically.  After several minutes of suctioning  and repeated applications of the epinephrine there was no ongoing bleeding. The bronchoscope was removed.  The patient was extubated in the operating room and taken to the postanesthetic care unit in good condition.   PUS D: 01/24/2022 1:59:28 pm T: 01/24/2022 7:17:00 pm  JOB: [id number]/ [id number]

## 2022-01-24 NOTE — Anesthesia Procedure Notes (Signed)
Procedure Name: Intubation Date/Time: 01/24/2022 12:40 PM  Performed by: Kriste Basque, RNPre-anesthesia Checklist: Patient identified, Emergency Drugs available, Suction available, Timeout performed and Patient being monitored Patient Re-evaluated:Patient Re-evaluated prior to induction Oxygen Delivery Method: Circle system utilized Preoxygenation: Pre-oxygenation with 100% oxygen Induction Type: IV induction Ventilation: Mask ventilation without difficulty Laryngoscope Size: Mac and 4 Grade View: Grade I Tube type: Oral Tube size: 8.5 mm Number of attempts: 1 Airway Equipment and Method: Stylet Placement Confirmation: ETT inserted through vocal cords under direct vision, positive ETCO2, CO2 detector and breath sounds checked- equal and bilateral Secured at: 24 cm Tube secured with: Tape Dental Injury: Teeth and Oropharynx as per pre-operative assessment

## 2022-01-25 ENCOUNTER — Encounter (HOSPITAL_COMMUNITY): Payer: Self-pay | Admitting: Thoracic Surgery (Cardiothoracic Vascular Surgery)

## 2022-01-28 NOTE — Anesthesia Postprocedure Evaluation (Signed)
Anesthesia Post Note  Patient: Darryl Ward  Procedure(s) Performed: VIDEO BRONCHOSCOPY WITH REMOVAL OF INTERBRONCHIAL VALVE (IBV)     Patient location during evaluation: PACU Anesthesia Type: General Level of consciousness: awake and alert Pain management: pain level controlled Vital Signs Assessment: post-procedure vital signs reviewed and stable Respiratory status: spontaneous breathing, nonlabored ventilation, respiratory function stable and patient connected to nasal cannula oxygen Cardiovascular status: blood pressure returned to baseline and stable Postop Assessment: no apparent nausea or vomiting Anesthetic complications: no  No notable events documented.  Last Vitals:  Vitals:   01/24/22 1400 01/24/22 1415  BP: 114/60 (!) 126/58  Pulse: 61 60  Resp: 15 15  Temp:    SpO2: 97% 98%    Last Pain:  Vitals:   01/24/22 1415  PainSc: 0-No pain                 Onnie Alatorre

## 2022-02-05 ENCOUNTER — Ambulatory Visit (INDEPENDENT_AMBULATORY_CARE_PROVIDER_SITE_OTHER): Payer: Self-pay | Admitting: Thoracic Surgery (Cardiothoracic Vascular Surgery)

## 2022-02-05 ENCOUNTER — Ambulatory Visit: Payer: PPO | Attending: Nurse Practitioner | Admitting: Nurse Practitioner

## 2022-02-05 ENCOUNTER — Other Ambulatory Visit: Payer: Self-pay | Admitting: Thoracic Surgery (Cardiothoracic Vascular Surgery)

## 2022-02-05 ENCOUNTER — Encounter: Payer: Self-pay | Admitting: Nurse Practitioner

## 2022-02-05 ENCOUNTER — Ambulatory Visit
Admission: RE | Admit: 2022-02-05 | Discharge: 2022-02-05 | Disposition: A | Discharge status: Home / Self Care | Payer: PPO | Source: Ambulatory Visit | Attending: Thoracic Surgery (Cardiothoracic Vascular Surgery) | Admitting: Thoracic Surgery (Cardiothoracic Vascular Surgery)

## 2022-02-05 ENCOUNTER — Encounter: Payer: Self-pay | Admitting: Thoracic Surgery (Cardiothoracic Vascular Surgery)

## 2022-02-05 VITALS — BP 102/64 | HR 55 | Resp 20 | Wt 160.5 lb

## 2022-02-05 VITALS — BP 100/58 | HR 59 | Ht 70.0 in | Wt 162.0 lb

## 2022-02-05 DIAGNOSIS — J9382 Other air leak: Secondary | ICD-10-CM

## 2022-02-05 DIAGNOSIS — Z902 Acquired absence of lung [part of]: Secondary | ICD-10-CM

## 2022-02-05 DIAGNOSIS — E785 Hyperlipidemia, unspecified: Secondary | ICD-10-CM

## 2022-02-05 DIAGNOSIS — I480 Paroxysmal atrial fibrillation: Secondary | ICD-10-CM | POA: Diagnosis not present

## 2022-02-05 DIAGNOSIS — I5042 Chronic combined systolic (congestive) and diastolic (congestive) heart failure: Secondary | ICD-10-CM | POA: Diagnosis not present

## 2022-02-05 DIAGNOSIS — I2510 Atherosclerotic heart disease of native coronary artery without angina pectoris: Secondary | ICD-10-CM

## 2022-02-05 DIAGNOSIS — I739 Peripheral vascular disease, unspecified: Secondary | ICD-10-CM

## 2022-02-05 DIAGNOSIS — C3411 Malignant neoplasm of upper lobe, right bronchus or lung: Secondary | ICD-10-CM

## 2022-02-05 DIAGNOSIS — Z8673 Personal history of transient ischemic attack (TIA), and cerebral infarction without residual deficits: Secondary | ICD-10-CM

## 2022-02-05 DIAGNOSIS — I10 Essential (primary) hypertension: Secondary | ICD-10-CM | POA: Diagnosis not present

## 2022-02-05 DIAGNOSIS — Z09 Encounter for follow-up examination after completed treatment for conditions other than malignant neoplasm: Secondary | ICD-10-CM

## 2022-02-05 DIAGNOSIS — C3491 Malignant neoplasm of unspecified part of right bronchus or lung: Secondary | ICD-10-CM

## 2022-02-05 NOTE — Patient Instructions (Signed)
Medication Instructions:  Your physician recommends that you continue on your current medications as directed. Please refer to the Current Medication list given to you today.   *If you need a refill on your cardiac medications before your next appointment, please call your pharmacy*   Lab Work: NONE ordered at this time of appointment   If you have labs (blood work) drawn today and your tests are completely normal, you will receive your results only by: MyChart Message (if you have MyChart) OR A paper copy in the mail If you have any lab test that is abnormal or we need to change your treatment, we will call you to review the results.   Testing/Procedures: NONE ordered at this time of appointment     Follow-Up: At [facility], you and your health needs are our priority.  As part of our continuing mission to provide you with exceptional heart care, we have created designated Provider Care Teams.  These Care Teams include your primary Cardiologist (physician) and Advanced Practice Providers (APPs -  Physician Assistants and Nurse Practitioners) who all work together to provide you with the care you need, when you need it.  We recommend signing up for the patient portal called "MyChart".  Sign up information is provided on this After Visit Summary.  MyChart is used to connect with patients for Virtual Visits (Telemedicine).  Patients are able to view lab/test results, encounter notes, upcoming appointments, etc.  Non-urgent messages can be sent to your provider as well.   To learn more about what you can do with MyChart, go to [email or web address].    Your next appointment:   3 month(s)  Provider:   [name], MD  or Bernadene Person, NP        Other Instructions [employer] blood pressure cuff discussed.

## 2022-02-05 NOTE — Progress Notes (Signed)
Office Visit    Patient Name: Darryl Ward Date of Encounter: 02/05/2022  Primary Care Provider:  [name], NP Primary Cardiologist:  Minus Breeding, MD  Chief Complaint    77 year old male with a history of CAD, paroxysmal atrial fibrillation, combined chronic systolic and diastolic heart failure, hypertension, hyperlipidemia, PVD, CVA, stage II lung adenocarcinoma, tobacco use, and chronic anemia/thrombocytopenia who presents for follow-up related to CAD, heart failure, and atrial fibrillation.    Past Medical History    Past Medical History:  Diagnosis Date   AAA (abdominal aortic aneurysm) ([place])    Arthritis    Cancer ([place])    Carotid artery disease ([place])    Nonobstructive   Cataract    COPD (chronic obstructive pulmonary disease) ([place])    Coronary atherosclerosis of native coronary artery    PTCA small diagonal 2007 otherwise nonobstructive CAD   Depression    Dysrhythmia    Essential hypertension, benign    Hyperlipidemia    NSTEMI (non-ST elevated myocardial infarction) ([place]) 2007   Stroke [facility]) 2004   TIA (transient ischemic attack) 2006   Past Surgical History:  Procedure Laterality Date   AORTA - BILATERAL FEMORAL ARTERY BYPASS GRAFT  01/08/2012   Procedure: AORTA BIFEMORAL BYPASS GRAFT;  Surgeon: Elam Dutch, MD;  Location: [facility] OR;  Service: Vascular;  Laterality: Bilateral;  using 18x5mm x 40cm Hemashield Gold Vascular Graft with Endarterectomy, Thombectomy and  Reimplantation of Inferior Mesenteric Artery   BACK SURGERY  2021   BRONCHIAL BIOPSY  06/11/2021   Procedure: BRONCHIAL BIOPSIES;  Surgeon: [name], MD;  Location: [facility] ENDOSCOPY;  Service: Pulmonary;;   BRONCHIAL BRUSHINGS  06/11/2021   Procedure: BRONCHIAL BRUSHINGS;  Surgeon: [name], MD;  Location: [place];  Service: Pulmonary;;   BRONCHIAL NEEDLE ASPIRATION BIOPSY  06/11/2021   Procedure: BRONCHIAL NEEDLE ASPIRATION BIOPSIES;  Surgeon: [name], MD;   Location: [facility] ENDOSCOPY;  Service: Pulmonary;;   CARDIOVERSION N/A 09/20/2021   Procedure: CARDIOVERSION;  Surgeon: Satira Sark, MD;  Location: [facility] ORS;  Service: Cardiovascular;  Laterality: N/A;   COLONOSCOPY N/A 04/14/2019   Procedure: COLONOSCOPY;  Surgeon: Daneil Dolin, MD;  Location: [facility] ENDO SUITE;  Service: Endoscopy;  Laterality: N/A;  9:30   COLONOSCOPY WITH PROPOFOL N/A 07/25/2021   Procedure: COLONOSCOPY WITH PROPOFOL;  Surgeon: Eloise Harman, DO;  Location: [facility] ENDO SU[facility];  Service: Endoscopy;  Laterality: N/A;  1:00pm   FIDUCIAL MARKER PLACEMENT  06/11/2021   Procedure: FIDUCIAL MARKER PLACEMENT;  Surgeon: [name], MD;  Location: [facility] ENDOSCOPY;  Service: Pulmonary;;   INTERCOSTAL NERVE BLOCK Right 11/12/2021   Procedure: INTERCOSTAL NERVE BLOCK;  Surgeon: [name], MD;  Location: [place];  Service: Thoracic;  Laterality: Right;   IR IMAGING GUIDED PORT INSERTION  07/31/2021   Left cataract surgery     LYMPH NODE DISSECTION Right 11/12/2021   Procedure: LYMPH NODE DISSECTION;  Surgeon: [name], MD;  Location: [place];  Service: Thoracic;  Laterality: Right;   POLYPECTOMY  04/14/2019   Procedure: POLYPECTOMY;  Surgeon: Daneil Dolin, MD;  [facility];  Service: Endoscopy;;   POLYPECTOMY  07/25/2021   Procedure: POLYPECTOMY;  Surgeon: Eloise Harman, DO;  [facility];  Service: Endoscopy;;   TEE WITHOUT CARDIOVERSION N/A 09/20/2021   Procedure: TRANSESOPHAGEAL ECHOCARDIOGRAM (TEE);  Surgeon: Satira Sark, MD;  Location: [facility] ORS;  Service: Cardiovascular;  Laterality: N/A;   TRANSFORAMINAL LUMBAR INTERBODY  FUSION (TLIF) WITH PEDICLE SCREW FIXATION 1 LEVEL N/A 04/27/2020   Procedure: Transforaminal Lumbar Interbody Fusion Lumbar Five-Sacral One;  Surgeon: Vallarie Mare, MD;  Location: [place];  Service: Neurosurgery;  Laterality: N/A;   VIDEO BRONCHOSCOPY WITH INSERTION OF INTERBRONCHIAL VALVE (IBV) N/A  11/22/2021   Procedure: VIDEO BRONCHOSCOPY WITH INSERTION OF INTERBRONCHIAL VALVE (IBV);  Surgeon: [name], MD;  Location: [facility] OR;  Service: Thoracic;  Laterality: N/A;   VIDEO BRONCHOSCOPY WITH INSERTION OF INTERBRONCHIAL VALVE (IBV) N/A 01/24/2022   Procedure: VIDEO BRONCHOSCOPY WITH REMOVAL OF INTERBRONCHIAL VALVE (IBV);  Surgeon: [name], MD;  Location: [place];  Service: Thoracic;  Laterality: N/A;   VIDEO BRONCHOSCOPY WITH RADIAL ENDOBRONCHIAL ULTRASOUND  06/11/2021   Procedure: VIDEO BRONCHOSCOPY WITH RADIAL ENDOBRONCHIAL ULTRASOUND;  Surgeon: [name], MD;  Location: [place] ENDOSCOPY;  Service: Pulmonary;;    Allergies  No Known Allergies   Labs/Other Studies Reviewed    The following studies were reviewed today: Myoview 12/30/2011: IMPRESSION:  Overall low risk Lexiscan Myoview as outlined.  There were no  diagnostic ST-segment abnormalities, occasional PACs and PVCs noted  without sustained arrhythmia.  Perfusion imaging is most consistent  with soft tissue attenuation affecting the inferior septal wall  without clear evidence of ischemia, and normal LVEF of 62%.   Echo 09/17/2021: IMPRESSIONS    1. Left ventricular ejection fraction, by estimation, is 35 to 40%. The  left ventricle has moderately decreased function. The left ventricle  demonstrates global hypokinesis. Left ventricular diastolic parameters are  indeterminate.   2. Right ventricular systolic function is normal. The right ventricular  size is normal. There is normal pulmonary artery systolic pressure. The  estimated right ventricular systolic pressure is 16.1 mmHg.   3. Left atrial size was moderately dilated.   4. The mitral valve is normal in structure. Mild mitral valve  regurgitation. No evidence of mitral stenosis.   5. Tricuspid valve regurgitation is moderate.   6. The aortic valve is normal in structure. Aortic valve regurgitation is  not visualized. No aortic stenosis is  present.   7. The inferior vena cava is dilated in size with >50% respiratory  variability, suggesting right atrial pressure of 8 mmHg.   Comparison(s): Prior images reviewed side by side. The left ventricular  function is worsened. Prior EF 60%.   Recent Labs: 09/17/2021: TSH 1.534 12/04/2021: B Natriuretic Peptide 62.8 12/17/2021: Magnesium 1.6 01/23/2022: ALT 18; BUN 11; Creatinine, Ser 1.04; Hemoglobin 10.3; Platelets 140; Potassium 4.7; Sodium 137  Recent Lipid Panel    Component Value Date/Time   CHOL 202 (H) 02/05/2020 0755   CHOL 198 11/25/2019 1123   TRIG 99 02/05/2020 0755   HDL 29 (L) 02/05/2020 0755   HDL 36 (L) 11/25/2019 1123   CHOLHDL 7.0 02/05/2020 0755   VLDL 20 02/05/2020 0755   LDLCALC 153 (H) 02/05/2020 0755   LDLCALC 139 (H) 11/25/2019 1123    History of Present Illness   77 year old male with the above past medical history including CAD, paroxysmal atrial fibrillation, combined chronic systolic and diastolic heart failure, hypertension, hyperlipidemia, PVD, CVA, stage II lung adenocarcinoma, tobacco use, and chronic anemia/thrombocytopenia.  He has a history of prior MI in 2007.  Myoview in 2013 was negative for ischemia.  He was hospitalized in 08/2021 in the setting of A-fib with RVR in 08/2021 s/p TEE/DCCV.  Echocardiogram at the time showed EF 30 to 35%, moderately decreased LV function, LV global hypokinesis, normal RV systolic function, moderate  BAE, no significant valvular abnormalities.  He was last seen in the office on 12/27/2021 and was noted to have recurrent atrial fibrillation.  He was scheduled for repeat DCCV.  However, upon arrival he was found to be in sinus bradycardia, and the procedure was canceled.  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  Postop course was complicated by prolonged air leak requiring IV  placement.   He presents today for follow-up  accompanied by his significant other.  Since his last visit he has been stable from a cardiac standpoint.  He denies any chest pain, dyspnea, palpitations, edema, PND, orthopnea, weight gain.  He recently underwent IV removal per CT surgery.  He plans to undergo 1 year immunotherapy for his lung cancer.  Overall, he reports feeling well.  Home Medications    Current Outpatient Medications  Medication Sig Dispense Refill   acetaminophen (TYLENOL) 500 MG tablet Take 2 tablets (1,000 mg total) by mouth every 6 (six) hours as needed. (Patient taking differently: Take 1,000 mg by mouth 2 (two) times daily as needed for moderate pain.) 30 tablet 0   albuterol (VENTOLIN HFA) 108 (90 Base) MCG/ACT inhaler Inhale 2 puffs into the lungs every 6 (six) hours as needed for wheezing or shortness of breath. 8 g 6   apixaban (ELIQUIS) 5 MG TABS tablet Take 1 tablet (5 mg total) by mouth 2 (two) times daily. 180 tablet 1   atorvastatin (LIPITOR) 80 MG tablet Take 80 mg by mouth daily.     cyclobenzaprine (FLEXERIL) 10 MG tablet Take 10 mg by mouth 2 (two) times daily as needed for muscle spasms.     escitalopram (LEXAPRO) 10 MG tablet Take 1 tablet (10 mg total) by mouth daily. 30 tablet 3   folic acid (FOLVITE) 1 MG tablet Take 1 tablet (1 mg total) by mouth daily. 15 tablet 0   furosemide (LASIX) 20 MG tablet Take 1 tablet (20 mg total) by mouth daily as needed. (Patient taking differently: Take 20 mg by mouth in the morning.) 90 tablet 2   Magnesium Oxide 400 MG CAPS Take 1 capsule (400 mg total) by mouth daily.  0   metoprolol succinate (TOPROL-XL) 50 MG 24 hr tablet Take 1.5 tablets (75 mg total) by mouth daily. Take with or immediately following a meal. 135 tablet 3   Tiotropium Bromide Monohydrate (SPIRIVA RESPIMAT) 2.5 MCG/ACT AERS Inhale 2 puffs into the lungs daily. 4 g 0   Tiotropium Bromide Monohydrate (SPIRIVA RESPIMAT) 2.5 MCG/ACT AERS Inhale 2 puffs into  the lungs daily. 4 g 0   traMADol (ULTRAM) 50 MG tablet Take 1 tablet (50 mg total) by mouth every 6 (six) hours as needed (mild pain). 30 tablet 0   No current facility-administered medications for this visit.     Review of Systems    He denies chest pain, palpitations, dyspnea, pnd, orthopnea, n, v, dizziness, syncope, edema, weight gain, or early satiety. All other systems reviewed and are otherwise negative except as noted above.   Physical Exam    VS:  BP (!) 100/58   Pulse (!) 59   Ht 5\' 10"  (1.778 m)   Wt 162 lb (73.5 kg)   SpO2 95%   BMI 23.24 kg/m   GEN: Well nourished, well developed, in no acute distress. HEENT: normal. Neck: Supple, no JVD, carotid bruits, or masses. Cardiac: RRR,  no murmurs, rubs, or gallops. No clubbing, cyanosis, edema.  Radials/DP/PT 2+ and equal bilaterally.  Respiratory:  Respirations regular and unlabored, clear to auscultation bilaterally. GI: Soft, nontender, nondistended, BS + x 4. MS: no deformity or atrophy. Skin: warm and dry, no rash. Neuro:  Strength and sensation are intact. Psych: Normal affect.  Accessory Clinical Findings    ECG personally reviewed by me today - sinus bradycardia, 59 bpm, PACs, incomplete RBBB - no acute changes.   Lab Results  Component Value Date   WBC 7.2 01/23/2022   HGB 10.3 (L) 01/23/2022   HCT 32.1 (L) 01/23/2022   MCV 99.7 01/23/2022   PLT 140 (L) 01/23/2022   Lab Results  Component Value Date   CREATININE 1.04 01/23/2022   BUN 11 01/23/2022   NA 137 01/23/2022   K 4.7 01/23/2022   CL 106 01/23/2022   CO2 23 01/23/2022   Lab Results  Component Value Date   ALT 18 01/23/2022   AST 26 01/23/2022   ALKPHOS 52 01/23/2022   BILITOT 0.4 01/23/2022   Lab Results  Component Value Date   CHOL 202 (H) 02/05/2020   HDL 29 (L) 02/05/2020   LDLCALC 153 (H) 02/05/2020   TRIG 99 02/05/2020   CHOLHDL 7.0 02/05/2020    Lab Results  Component Value Date   HGBA1C 5.1 02/05/2020    Assessment &  Plan    1. CAD: S/p prior MI. Myoview in 2013 was negative for ischemia. Stable with no anginal symptoms. No indication for ischemic evaluation.  Continue metoprolol, Lipitor.  2. Paroxysmal atrial fibrillation: S/p TEE/DCCV in 08/2021. Recurrent a. Fib in 12/2021. Repeat DCCV was canceled due to spontaneous conversion to NSR.  EKG today shows sinus bradycardia, PACs. Discussed ongoing monitoring with KardiaMobile device, ED precautions.  Continue metoprolol, Eliquis.  3. Chronic combined systolic and diastolic heart failure: Echo in 08/2021 showed EF 30-35%, moderately decreased LV function, LV global hypokinesis, normal RV systolic function, moderate BAE, no significant valvular abnormalities. Euvolemic and well compensated on exam.  Further escalation of GDMT limited in the setting of low BP.  Continue metoprolol, Lasix.  4. Hypertension: BP well controlled, runs low. Continue current antihypertensive regimen.   5. Hyperlipidemia: Most recent LDL not available for review.  Monitor managed per PCP.  Continue Lipitor.  6. PVD: Continue statin therapy as above.   7. History of CVA: No recurrence. Continue statin, Eliquis.   8. Stage II lung adenocarcinoma: S/p chemo and right upper lobectomy in 10/2021.  Postop course was complicated by prolonged air leak requiring IV placement, since removed. Following with pulmonology, oncology and CT surgery.   9. Disposition: Follow-up in 3 months.      Joylene Grapes, NP 02/05/2022, 1:42 PM

## 2022-02-05 NOTE — Progress Notes (Signed)
[address]       [place] [address]             [phone]     HPI: Mr. Swider returns for follow-up after recent removal of intrabronchial valves.  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  He underwent neoadjuvant chemoimmunotherapy followed by robotic assisted right upper lobectomy.  Posttreatment pathologic stage was 1A.  His postoperative course was complicated by a prolonged air leak requiring IBV placement on 12/02/2021.  Those were removed on 01/24/2022.  He has not had any respiratory issues since removal of the valves.  He feels well.  He is not having any incisional pain.  Past Medical History:  Diagnosis Date   AAA (abdominal aortic aneurysm) ([facility])    Arthritis    Cancer ([facility])    Carotid artery disease ([facility])    Nonobstructive   Cataract    COPD (chronic obstructive pulmonary disease) ([facility])    Coronary atherosclerosis of native coronary artery    PTCA small diagonal 2007 otherwise nonobstructive CAD   Depression    Dysrhythmia    Essential hypertension, benign    Hyperlipidemia    NSTEMI (non-ST elevated myocardial infarction) ([facility]) 2007   Stroke [facility]) 2004   TIA (transient ischemic attack) 2006    Current Outpatient Medications  Medication Sig Dispense Refill   acetaminophen (TYLENOL) 500 MG tablet Take 2 tablets (1,000 mg total) by mouth every 6 (six) hours as needed. (Patient taking differently: Take 1,000 mg by mouth 2 (two) times daily as needed for moderate pain.) 30 tablet 0   albuterol (VENTOLIN HFA) 108 (90 Base) MCG/ACT inhaler Inhale 2 puffs into the lungs every 6 (six)  hours as needed for wheezing or shortness of breath. 8 g 6   apixaban (ELIQUIS) 5 MG TABS tablet Take 1 tablet (5 mg total) by mouth 2 (two) times daily. 180 tablet 1   atorvastatin (LIPITOR) 80 MG tablet Take 80 mg by mouth daily.     cyclobenzaprine (FLEXERIL) 10 MG tablet Take 10 mg by mouth 2 (two) times daily as needed for muscle spasms.     escitalopram (LEXAPRO) 10 MG tablet Take 1 tablet (10 mg total) by mouth daily. 30 tablet 3   folic acid (FOLVITE) 1 MG tablet Take 1 tablet (1 mg total) by mouth daily. 15 tablet 0   furosemide (LASIX) 20 MG tablet Take 1 tablet (20 mg total) by mouth daily as needed. (Patient taking differently: Take 20 mg by mouth in the morning.) 90 tablet 2   Magnesium Oxide 400 MG CAPS Take 1 capsule (400 mg total) by mouth daily.  0   metoprolol succinate (TOPROL-XL) 50 MG 24 hr tablet Take 1.5 tablets (75 mg total) by mouth daily. Take with or immediately following a meal. 135 tablet 3   Tiotropium Bromide Monohydrate (SPIRIVA RESPIMAT) 2.5 MCG/ACT AERS Inhale 2 puffs into the lungs daily. 4 g 0   Tiotropium Bromide Monohydrate (SPIRIVA RESPIMAT) 2.5 MCG/ACT AERS Inhale 2 puffs into the lungs daily. 4 g 0   traMADol (ULTRAM) 50 MG tablet Take 1 tablet (50 mg total) by mouth every 6 (six) hours  as needed (mild pain). 30 tablet 0   No current facility-administered medications for this visit.    Physical Exam BP 102/64 (BP Location: Left Arm, Patient Position: Sitting, Cuff Size: Normal)   Pulse (!) 55   Resp 20   Wt 160 lb 8 oz (72.8 kg)   SpO2 95%   BMI 23.59 kg/m  77 year old man in no acute distress Alert and oriented x 3 with no focal deficits Lungs diminished at right base but otherwise clear Cardiac regular rate and irregular rhythm  Diagnostic Tests: CHEST - 2 VIEW   COMPARISON:  Chest x-ray dated January 23, 2022.   FINDINGS: Unchanged left chest wall port catheter. The heart size and mediastinal contours are within normal limits. Interval  removal of the right-sided intrabronchial valves. Stable volume loss in the right hemithorax from prior right upper lobectomy. Unchanged small loculated right hydropneumothorax. The left lung is clear. No acute osseous abnormality.   IMPRESSION: 1. Unchanged small loculated right hydropneumothorax. Interval removal of the right-sided intrabronchial valves.     Electronically Signed   By: Obie Dredge M.D.   On: 02/05/2022 14:34 I personally reviewed the chest x-ray images.  No change in the small apical space.  Otherwise unremarkable.  Impression: Darryl Ward is a 77 year old man who underwent a robotic assisted right upper lobectomy on 11/12/2021 for a stage IIb adenocarcinoma after neoadjuvant chemoimmunotherapy.  The neoadjuvant therapy did downstage him to  IA (T1, N0).    He had a prolonged air leak postoperatively requiring intrabronchial valve placement.  His airleak resolved and his chest tube was removed.  He then went back for valve removal on 01/24/2022.  He has done well since then.  He has seen Dr. Ellin Saba.  He is going to continue on immunotherapy for a year.  No restrictions on his activities from my standpoint.  Plan: Return in 3 months with PA lateral chest x-ray to check on progress  Loreli Slot, MD [facility] [phone]

## 2022-02-06 ENCOUNTER — Encounter: Payer: Self-pay | Admitting: Hematology

## 2022-02-06 ENCOUNTER — Other Ambulatory Visit (HOSPITAL_COMMUNITY): Payer: Self-pay

## 2022-02-06 ENCOUNTER — Ambulatory Visit: Payer: PPO | Admitting: Thoracic Surgery (Cardiothoracic Vascular Surgery)

## 2022-02-07 ENCOUNTER — Ambulatory Visit: Payer: PPO | Admitting: Thoracic Surgery (Cardiothoracic Vascular Surgery)

## 2022-02-07 ENCOUNTER — Inpatient Hospital Stay: Payer: PPO | Admitting: Dietician

## 2022-02-07 ENCOUNTER — Encounter: Payer: Self-pay | Admitting: Hematology

## 2022-02-07 ENCOUNTER — Inpatient Hospital Stay: Payer: PPO | Attending: Hematology | Admitting: Hematology

## 2022-02-07 VITALS — BP 120/56 | HR 65 | Temp 97.8°F | Resp 18 | Wt 161.2 lb

## 2022-02-07 DIAGNOSIS — Z79899 Other long term (current) drug therapy: Secondary | ICD-10-CM | POA: Diagnosis not present

## 2022-02-07 DIAGNOSIS — Z9221 Personal history of antineoplastic chemotherapy: Secondary | ICD-10-CM | POA: Diagnosis not present

## 2022-02-07 DIAGNOSIS — Z87891 Personal history of nicotine dependence: Secondary | ICD-10-CM | POA: Diagnosis not present

## 2022-02-07 DIAGNOSIS — I4891 Unspecified atrial fibrillation: Secondary | ICD-10-CM | POA: Diagnosis not present

## 2022-02-07 DIAGNOSIS — I714 Abdominal aortic aneurysm, without rupture, unspecified: Secondary | ICD-10-CM | POA: Insufficient documentation

## 2022-02-07 DIAGNOSIS — I10 Essential (primary) hypertension: Secondary | ICD-10-CM | POA: Insufficient documentation

## 2022-02-07 DIAGNOSIS — I2510 Atherosclerotic heart disease of native coronary artery without angina pectoris: Secondary | ICD-10-CM | POA: Diagnosis not present

## 2022-02-07 DIAGNOSIS — I252 Old myocardial infarction: Secondary | ICD-10-CM | POA: Diagnosis not present

## 2022-02-07 DIAGNOSIS — C3411 Malignant neoplasm of upper lobe, right bronchus or lung: Secondary | ICD-10-CM

## 2022-02-07 DIAGNOSIS — M129 Arthropathy, unspecified: Secondary | ICD-10-CM | POA: Insufficient documentation

## 2022-02-07 DIAGNOSIS — E785 Hyperlipidemia, unspecified: Secondary | ICD-10-CM | POA: Insufficient documentation

## 2022-02-07 DIAGNOSIS — Z7901 Long term (current) use of anticoagulants: Secondary | ICD-10-CM | POA: Insufficient documentation

## 2022-02-07 DIAGNOSIS — Z8673 Personal history of transient ischemic attack (TIA), and cerebral infarction without residual deficits: Secondary | ICD-10-CM | POA: Diagnosis not present

## 2022-02-07 DIAGNOSIS — J449 Chronic obstructive pulmonary disease, unspecified: Secondary | ICD-10-CM | POA: Diagnosis not present

## 2022-02-07 DIAGNOSIS — F329 Major depressive disorder, single episode, unspecified: Secondary | ICD-10-CM | POA: Insufficient documentation

## 2022-02-07 NOTE — Progress Notes (Signed)
Nutrition Follow-up:  Patient with non-small cell lung cancer. He completed neoadjuvant chemoimmunotherapy followed by right upper lobectomy on 11/12/21. Planning adjuvant immunotherapy with Keytruda.   Met with patient and wife in clinic. Patient reports appetite has significantly improved. He is eating 3 meals + snacks. Patient continues drinking 1-2 Ensure daily. Patient reports he has increased energy. Patient denies nausea, vomiting, diarrhea. He is constipated. Patient to start bowel regimen.    Medications: reviewed   Labs: 12/19 labs reviewed   Anthropometrics: Wt 161 lb 2.5 oz today increased   1/3 - 156 lb 9.6 oz 12/7 - 147 lb  11/14 - 152 lb    NUTRITION DIAGNOSIS: Unintentional weight loss improved    INTERVENTION:  Continue strategies for increasing calories and protein with small frequent meals and snacks Reviewed foods with protein - encouraged protein source with all meals/snacks Continue drinking Ensure once daily Complimentary case Ensure Plus HP provided     MONITORING, EVALUATION, GOAL: weight trends, intake    NEXT VISIT: To be scheduled as needed

## 2022-02-07 NOTE — Patient Instructions (Addendum)
[facility]  Discharge Instructions  You were seen and examined today by Dr. Ellin Saba.  Dr. Ellin Saba has recommended continuing physical therapy. We will give you another month before starting immunotherapy Rande Lawman).  Follow-up as scheduled.  Thank you for choosing [facility] to provide your oncology and hematology care.   To afford each patient quality time with our provider, please arrive at least 15 minutes before your scheduled appointment time. You may need to reschedule your appointment if you arrive late (10 or more minutes). Arriving late affects you and other patients whose appointments are after yours.  Also, if you miss three or more appointments without notifying the office, you may be dismissed from the clinic at the provider's discretion.    Again, thank you for choosing [facility].  Our hope is that these requests will decrease the amount of time that you wait before being seen by our physicians.   If you have a lab appointment with the Cancer Center please come in thru the Main Entrance and check in at the main information desk.           _____________________________________________________________  Should you have questions after your visit to [facility] [place], please contact our office at [phone] and follow the prompts.  Our office hours are 8:00 a.m. to 4:30 p.m. Monday - Thursday and 8:00 a.m. to 2:30 p.m. Friday.  Please note that voicemails left after 4:00 p.m. may not be returned until the following business day.  We are closed weekends and all major holidays.  You do have access to a nurse 24-7, just call the main number to the clinic [phone] and do not press any options, hold on the line and a nurse will answer the phone.    For prescription refill requests, have your pharmacy contact our office and allow 72 hours.    Masks are optional in the cancer centers. If you would  like for your care team to wear a mask while they are taking care of you, please let them know. You may have one support person who is at least 75 years old accompany you for your appointments.

## 2022-02-07 NOTE — Progress Notes (Signed)
[facility] [address], [place] [address]   CLINIC:  Medical Oncology/Hematology  PCP:  Rebekah Chesterfield, NP [name] 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  Neoadjuvant chemoimmunotherapy with carboplatin, pemetrexed  NGS Results: not done  CURRENT THERAPY: Adjuvant pembrolizumab  BRIEF ONCOLOGIC HISTORY:  Oncology History  Primary adenocarcinoma of upper lobe of right lung (HCC)  06/26/2021 Initial Diagnosis   Primary adenocarcinoma of upper lobe of right lung (HCC)   08/06/2021 - 08/27/2021 Chemotherapy   Patient is on Treatment Plan : LUNG NSCLC Pemetrexed + Carboplatin q21d x 4 Cycles     08/06/2021 -  Chemotherapy   Patient is on Treatment Plan : LUNG NSCLC Pemetrexed + Carboplatin q21d x 4 Cycles       CANCER STAGING:  Cancer Staging  Primary adenocarcinoma of upper lobe of right lung (HCC) Staging form: Lung, AJCC 8th Edition - Clinical stage from 06/26/2021: cT1, cN1, cM0 - Unsigned   INTERVAL HISTORY:  [name], a 77 y.o. male, seen for follow-up of right lung cancer.  He had removal of intrabronchial valves by Dr. Dorris Fetch on 01/24/2022.  Since then he has been eating well.  He is also walking with a cane.  He is doing physical therapy twice weekly.  He reports that Lexapro is helping with depression.  REVIEW OF SYSTEMS:  Review of Systems  Gastrointestinal:  Positive for constipation.  Psychiatric/Behavioral:  Positive for depression.   All other systems reviewed and are negative.   PAST MEDICAL/SURGICAL HISTORY:  Past Medical History:  Diagnosis Date   AAA (abdominal aortic aneurysm) (HCC)    Arthritis    Cancer (HCC)    Carotid artery disease (HCC)    Nonobstructive   Cataract    COPD (chronic obstructive pulmonary disease) (HCC)    Coronary atherosclerosis of native coronary artery    PTCA small diagonal 2007  otherwise nonobstructive CAD   Depression    Dysrhythmia    Essential hypertension, benign    Hyperlipidemia    NSTEMI (non-ST elevated myocardial infarction) (HCC) 2007   Stroke [facility]) 2004   TIA (transient ischemic attack) 2006   Past Surgical History:  Procedure Laterality Date   AORTA - BILATERAL FEMORAL ARTERY BYPASS GRAFT  01/08/2012   Procedure: AORTA BIFEMORAL BYPASS GRAFT;  Surgeon: Sherren Kerns, MD;  Location: [facility] OR;  Service: Vascular;  Laterality: Bilateral;  using 18x62mm x 40cm Hemashield Gold Vascular Graft with Endarterectomy, Thombectomy and  Reimplantation of Inferior Mesenteric Artery   BACK SURGERY  2021   BRONCHIAL BIOPSY  06/11/2021   Procedure: BRONCHIAL BIOPSIES;  Surgeon: Leslye Peer, MD;  Location: [facility] ENDOSCOPY;  Service: Pulmonary;;   BRONCHIAL BRUSHINGS  06/11/2021   Procedure: BRONCHIAL BRUSHINGS;  Surgeon: Leslye Peer, MD;  Location: [facility] ENDOSCOPY;  Service: Pulmonary;;   BRONCHIAL NEEDLE ASPIRATION BIOPSY  06/11/2021   Procedure: BRONCHIAL NEEDLE ASPIRATION BIOPSIES;  Surgeon: Leslye Peer, MD;  Location: [facility] ENDOSCOPY;  Service: Pulmonary;;   CARDIOVERSION N/A 09/20/2021   Procedure: CARDIOVERSION;  Surgeon: [name], MD;  Location: [facility] ORS;  Service: Cardiovascular;  Laterality: N/A;   COLONOSCOPY N/A 04/14/2019   Procedure: COLONOSCOPY;  Surgeon: Corbin Ade, MD;  Location: [facility] ENDO SUITE;  Service: Endoscopy;  Laterality: N/A;  9:30   COLONOSCOPY WITH PROPOFOL N/A 07/25/2021   Procedure: COLONOSCOPY WITH PROPOFOL;  Surgeon: Lanelle Bal, DO;  Location: [facility] ENDO SUITE;  Service: Endoscopy;  Laterality: N/A;  1:00pm   FIDUCIAL MARKER PLACEMENT  06/11/2021   Procedure: FIDUCIAL MARKER PLACEMENT;  Surgeon: Leslye Peer, MD;  Location: [facility] ENDOSCOPY;  Service: Pulmonary;;   INTERCOSTAL NERVE BLOCK Right 11/12/2021   Procedure: INTERCOSTAL NERVE BLOCK;  Surgeon: Loreli Slot, MD;  Location: [facility] OR;  Service: Thoracic;   Laterality: Right;   IR IMAGING GUIDED PORT INSERTION  07/31/2021   Left cataract surgery     LYMPH NODE DISSECTION Right 11/12/2021   Procedure: LYMPH NODE DISSECTION;  Surgeon: Loreli Slot, MD;  Location: [facility] OR;  Service: Thoracic;  Laterality: Right;   POLYPECTOMY  04/14/2019   Procedure: POLYPECTOMY;  Surgeon: Corbin Ade, MD;  [facility]: [facility] ENDO SUITE;  Service: Endoscopy;;   POLYPECTOMY  07/25/2021   Procedure: POLYPECTOMY;  Surgeon: Lanelle Bal, DO;  Location: [facility] ENDO SUITE;  Service: Endoscopy;;   TEE WITHOUT CARDIOVERSION N/A 09/20/2021   Procedure: TRANSESOPHAGEAL ECHOCARDIOGRAM (TEE);  Surgeon: [name], MD;  Location: [facility] ORS;  Service: Cardiovascular;  Laterality: N/A;   TRANSFORAMINAL LUMBAR INTERBODY FUSION (TLIF) WITH PEDICLE SCREW FIXATION 1 LEVEL N/A 04/27/2020   Procedure: Transforaminal Lumbar Interbody Fusion Lumbar Five-Sacral One;  Surgeon: Bedelia Person, MD;  Location: [facility] OR;  Service: Neurosurgery;  Laterality: N/A;   VIDEO BRONCHOSCOPY WITH INSERTION OF INTERBRONCHIAL VALVE (IBV) N/A 11/22/2021   Procedure: VIDEO BRONCHOSCOPY WITH INSERTION OF INTERBRONCHIAL VALVE (IBV);  Surgeon: Loreli Slot, MD;  Location: [facility] OR;  Service: Thoracic;  Laterality: N/A;   VIDEO BRONCHOSCOPY WITH INSERTION OF INTERBRONCHIAL VALVE (IBV) N/A 01/24/2022   Procedure: VIDEO BRONCHOSCOPY WITH REMOVAL OF INTERBRONCHIAL VALVE (IBV);  Surgeon: Loreli Slot, MD;  Location: [facility] OR;  Service: Thoracic;  Laterality: N/A;   VIDEO BRONCHOSCOPY WITH RADIAL ENDOBRONCHIAL ULTRASOUND  06/11/2021   Procedure: VIDEO BRONCHOSCOPY WITH RADIAL ENDOBRONCHIAL ULTRASOUND;  Surgeon: Leslye Peer, MD;  Location: [facility] ENDOSCOPY;  Service: Pulmonary;;    SOCIAL HISTORY:  Social History   Socioeconomic History   Marital status: Divorced    Spouse name: Not on file   Number of children: 1   Years of education: 11   Highest education level: 11th grade   Occupational History    Employer: Engineer, materials  Tobacco Use   Smoking status: Former    Packs/day: 1.00    Years: 40.00    Total pack years: 40.00    Types: Cigarettes    Quit date: 07/2021    Years since quitting: 0.5   Smokeless tobacco: Never   Tobacco comments:    1 pack of cigarettes smoked daily. 07/17/21 ARJ, RN   Vaping Use   Vaping Use: Never used  Substance and Sexual Activity   Alcohol use: No    Comment: Prior history of regular alcohol use   Drug use: No   Sexual activity: Not Currently  Other Topics Concern   Not on file  Social History Narrative   Not on file   Social Determinants of Health   Financial Resource Strain: Low Risk  (11/29/2019)   Overall Financial Resource Strain (CARDIA)    Difficulty of Paying Living Expenses: Not hard at all  Food Insecurity: No Food Insecurity (11/29/2021)   Hunger Vital Sign    Worried About Running Out of Food in the Last Year: Never true    Ran Out of Food in the Last Year: Never true  Transportation Needs: No  Transportation Needs (11/29/2021)   PRAPARE - Hydrologist (Medical): No    Lack of Transportation (Non-Medical): No  Physical Activity: Inactive (11/29/2019)   Exercise Vital Sign    Days of Exercise per Week: 0 days    Minutes of Exercise per Session: 0 min  Stress: No Stress Concern Present (11/29/2019)   [place]    Feeling of Stress : Only a little  Social Connections: Moderately Isolated (11/29/2019)   Social Connection and Isolation Panel [NHANES]    Frequency of Communication with Friends and Family: More than three times a week    Frequency of Social Gatherings with Friends and Family: More than three times a week    Attends Religious Services: 1 to 4 times per year    Active Member of [employer] or Organizations: No    Attends Archivist Meetings: Never    Marital Status: Divorced  Arboriculturist Violence: Not At Risk (11/13/2021)   Humiliation, Afraid, Rape, and Kick questionnaire    Fear of Current or Ex-Partner: No    Emotionally Abused: No    Physically Abused: No    Sexually Abused: No    FAMILY HISTORY:  Family History  Problem Relation Age of Onset   Hyperlipidemia Sister    Heart attack Brother 74   Cancer - Colon Neg Hx     CURRENT MEDICATIONS:  Current Outpatient Medications  Medication Sig Dispense Refill   acetaminophen (TYLENOL) 500 MG tablet Take 2 tablets (1,000 mg total) by mouth every 6 (six) hours as needed. (Patient taking differently: Take 1,000 mg by mouth 2 (two) times daily as needed for moderate pain.) 30 tablet 0   albuterol (VENTOLIN HFA) 108 (90 Base) MCG/ACT inhaler Inhale 2 puffs into the lungs every 6 (six) hours as needed for wheezing or shortness of breath. 8 g 6   apixaban (ELIQUIS) 5 MG TABS tablet Take 1 tablet (5 mg total) by mouth 2 (two) times daily. 180 tablet 1   atorvastatin (LIPITOR) 80 MG tablet Take 80 mg by mouth daily.     cyclobenzaprine (FLEXERIL) 10 MG tablet Take 10 mg by mouth 2 (two) times daily as needed for muscle spasms.     escitalopram (LEXAPRO) 10 MG tablet Take 1 tablet (10 mg total) by mouth daily. 30 tablet 3   folic acid (FOLVITE) 1 MG tablet Take 1 tablet (1 mg total) by mouth daily. 15 tablet 0   furosemide (LASIX) 20 MG tablet Take 1 tablet (20 mg total) by mouth daily as needed. (Patient taking differently: Take 20 mg by mouth in the morning.) 90 tablet 2   Magnesium Oxide 400 MG CAPS Take 1 capsule (400 mg total) by mouth daily.  0   metoprolol succinate (TOPROL-XL) 50 MG 24 hr tablet Take 1.5 tablets (75 mg total) by mouth daily. Take with or immediately following a meal. 135 tablet 3   Tiotropium Bromide Monohydrate (SPIRIVA RESPIMAT) 2.5 MCG/ACT AERS Inhale 2 puffs into the lungs daily. 4 g 0   Tiotropium Bromide Monohydrate (SPIRIVA RESPIMAT) 2.5 MCG/ACT AERS Inhale 2 puffs into the lungs daily. 4 g 0    traMADol (ULTRAM) 50 MG tablet Take 1 tablet (50 mg total) by mouth every 6 (six) hours as needed (mild pain). 30 tablet 0   No current facility-administered medications for this visit.    ALLERGIES:  No Known Allergies  PHYSICAL EXAM:  Performance status (ECOG): 1 -  Symptomatic but completely ambulatory  Vitals:   02/07/22 1346  BP: (!) 120/56  Pulse: 65  Resp: 18  Temp: 97.8 F (36.6 C)  SpO2: 100%    Wt Readings from Last 3 Encounters:  02/07/22 161 lb 2.5 oz (73.1 kg)  02/05/22 160 lb 8 oz (72.8 kg)  02/05/22 162 lb (73.5 kg)   Physical Exam Vitals reviewed.  Constitutional:      Appearance: Normal appearance.  Cardiovascular:     Rate and Rhythm: Normal rate and regular rhythm.     Pulses: Normal pulses.     Heart sounds: Normal heart sounds.  Pulmonary:     Effort: Pulmonary effort is normal.     Breath sounds: Normal breath sounds.  Neurological:     General: No focal deficit present.     Mental Status: He is alert and oriented to person, place, and time.  Psychiatric:        Mood and Affect: Mood normal.        Behavior: Behavior normal.     LABORATORY DATA:  I have reviewed the labs as listed.     Latest Ref Rng & Units 01/23/2022   10:50 AM 01/08/2022    2:04 PM 12/17/2021    2:08 PM  CBC  WBC 4.0 - 10.5 K/uL 7.2  7.8  8.9   Hemoglobin 13.0 - 17.0 g/dL 10.3  10.4  9.6   Hematocrit 39.0 - 52.0 % 32.1  32.0  29.7   Platelets 150 - 400 K/uL 140  148  220       Latest Ref Rng & Units 01/23/2022   10:50 AM 01/08/2022    2:04 PM 12/17/2021    2:08 PM  CMP  Glucose 70 - 99 mg/dL 101  125  95   BUN 8 - 23 mg/dL 11  10  13    Creatinine 0.61 - 1.24 mg/dL 1.04  0.93  0.89   Sodium 135 - 145 mmol/L 137  139  137   Potassium 3.5 - 5.1 mmol/L 4.7  4.8  3.8   Chloride 98 - 111 mmol/L 106  102  102   CO2 22 - 32 mmol/L 23  23  23    Calcium 8.9 - 10.3 mg/dL 8.8  9.3  8.8   Total Protein 6.5 - 8.1 g/dL 6.2   7.1   Total Bilirubin 0.3 - 1.2 mg/dL 0.4    0.5   Alkaline Phos 38 - 126 U/L 52   63   AST 15 - 41 U/L 26   22   ALT 0 - 44 U/L 18   14     DIAGNOSTIC IMAGING:  I have independently reviewed the scans and discussed with the patient. DG Chest 2 View  Result Date: 02/05/2022 CLINICAL DATA:  Air leak.  Prior right upper lobectomy. EXAM: CHEST - 2 VIEW COMPARISON:  Chest x-ray dated January 23, 2022. FINDINGS: Unchanged left chest wall port catheter. The heart size and mediastinal contours are within normal limits. Interval removal of the right-sided intrabronchial valves. Stable volume loss in the right hemithorax from prior right upper lobectomy. Unchanged small loculated right hydropneumothorax. The left lung is clear. No acute osseous abnormality. IMPRESSION: 1. Unchanged small loculated right hydropneumothorax. Interval removal of the right-sided intrabronchial valves. Electronically Signed   By: Titus Dubin M.D.   On: 02/05/2022 14:34   DG Chest 2 View  Result Date: 01/24/2022 CLINICAL DATA:  Pre op VIDEO BRONCHOSCOPY WITH REMOVAL OF INTERBRONCHIAL VALVE (IBV  EXAM: CHEST - 2 VIEW COMPARISON:  Chest x-ray 12/27/2021 FINDINGS: Left chest wall Port-A-Cath with tip overlying the expected region of the superior cavoatrial junction. The heart and mediastinal contours are unchanged. Aortic calcification. Stable to slightly increased in size of a right moderate volume loculated hydropneumothorax. Right lung surgical changes. No focal consolidation. No pulmonary edema. No pleural effusion. No pneumothorax. No acute osseous abnormality. IMPRESSION: 1. Stable to slightly increased in size of a right moderate volume loculated hydropneumothorax. 2.  Aortic Atherosclerosis (ICD10-I70.0). Electronically Signed   By: Tish Frederickson M.D.   On: 01/24/2022 02:13     ASSESSMENT:  Stage II (T1 N1 M0) right upper lobe adenocarcinoma: - CT chest on 06/08/2021: 1.2 x 1.1 cm subsolid subpleural nodule of the peripheral posterior right upper lobe.  Occasional  additional small pulmonary nodules measuring 0.4 cm and smaller. - Bronchoscopy (06/11/2021): RUL nodule brushing and FNA: Malignant cells with features of adenocarcinoma. - PET scan (06/22/2021): Subsolid nodular lesion in the right upper lobe, hypermetabolic SUV 15.  Small right hilar and infrahilar lymph nodes with SUV 4.03 concerning for metastatic adenopathy.  Small hypermetabolic left parotid gland lesion, consistent with previous history of Wharton's tumor.  Hypermetabolic focus in the transverse colon SUV 8.69. - MRI of the brain (07/11/2021): No evidence of metastatic disease.  Right sphenoid wing meningioma stable since 2022. - He was evaluated by Dr. Dorris Fetch and was recommended to undergo neoadjuvant chemoimmunotherapy followed by surgical resection. - 3 cycles of carboplatin, pemetrexed from 08/06/2021 through 10/02/2021 (opdivo given with only cycle 2, discontinued for cycle 3 due to cardiac problems) - NGS testing not performed due to insufficient sample. - Guardant[employer]: Negative for EGFR and ALK.  MSI high not detected. 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  Negative for visceral pleural involvement, LVI.  PD-L1 TPS is 1%.    Social/family history: - Lives at home with his wife.  Uses cane occasionally after he had stroke in January 2022.  He has retired after working in [employer].  He is current active smoker, 1 pack/day for 60 years.  No exposure to chemicals. - Brother died of metastatic cancer.  Maternal uncle had lung cancer.   PLAN:  Stage II (T1 N1 M0) right upper lobe adenocarcinoma: - He had removal of intrabronchial valves by Dr. Dorris Fetch on 01/24/2022 and is feeling much better since then.  Energy levels have improved. - His eating has improved. - He is still weak and walks with a cane  and a walker.  He is doing physical therapy twice weekly. - We talked about pembrolizumab given for 1 year which has shown improved overall survival. - We will likely start in 4 weeks when he gets little bit more stronger. - We discussed side effects in detail.  2.  Abdominal uptake on PET scan: - Colonoscopy on 07/25/2021 showed nonbleeding internal hemorrhoids, multiple subcentimeter polyps, pathology consistent with tubular adenomas.  3.  Atrial fibrillation: - 2D echo showed LVEF of 35-40% in August. - He is under medical management.  Cardioversion was attempted but he was thought to be in normal sinus rhythm. - He is under medical management. - Will repeat echocardiogram in 1 month.  4.  Depression: - Continue Lexapro 10 mg daily which is helping.     Orders placed this encounter:  No orders of  the defined types were placed in this encounter.     [name], MD [facility] [phone]

## 2022-02-07 NOTE — Progress Notes (Signed)
DISCONTINUE ON PATHWAY REGIMEN - Non-Small Cell Lung     A cycle is every 21 days:     Nivolumab      Paclitaxel      Carboplatin   **Always confirm dose/schedule in your pharmacy ordering system**  REASON: Other Reason PRIOR TREATMENT: [id number]: Carboplatin AUC=6 + Paclitaxel 200 mg/m2 + Nivolumab 360 mg q21 Days x up to 3 Cycles TREATMENT RESPONSE: Partial Response (PR)  START OFF PATHWAY REGIMEN - Non-Small Cell Lung   [id number]:Pembrolizumab 200 mg IV D1 q21 Days:   A cycle is every 21 days:     Pembrolizumab   **Always confirm dose/schedule in your pharmacy ordering system**  Patient Characteristics: Post-Neoadjuvant Therapy and Resection (Neoadjuvant Pathologic Staging), Stage I Therapeutic Status: Post-Neoadjuvant Therapy and Resection (Neoadjuvant Pathologic Staging) AJCC T Category: ypT1b AJCC N Category: ypN0 AJCC M Category: cM0 AJCC 8 Stage Grouping: IA2 Intent of Therapy: Curative Intent, Discussed with Patient

## 2022-02-08 ENCOUNTER — Other Ambulatory Visit (HOSPITAL_COMMUNITY): Payer: Self-pay

## 2022-02-08 MED ORDER — CYCLOBENZAPRINE HCL 10 MG PO TABS
10.0000 mg | ORAL_TABLET | Freq: Two times a day (BID) | ORAL | 3 refills | Status: DC | PRN
  Filled 2022-02-08 – 2022-02-11 (×2): qty 180, 90d supply, fill #0

## 2022-02-08 MED ORDER — LIDOCAINE-PRILOCAINE 2.5-2.5 % EX CREA
TOPICAL_CREAM | CUTANEOUS | 3 refills | Status: DC
  Filled 2022-02-08 – 2022-02-11 (×2): qty 30, 30d supply, fill #0

## 2022-02-08 MED ORDER — PROCHLORPERAZINE MALEATE 10 MG PO TABS
10.0000 mg | ORAL_TABLET | Freq: Four times a day (QID) | ORAL | 3 refills | Status: DC | PRN
  Filled 2022-02-11: qty 60, 15d supply, fill #0
  Filled 2022-03-09: qty 60, 15d supply, fill #1
  Filled 2022-06-06: qty 60, 15d supply, fill #2

## 2022-02-08 MED ORDER — FUROSEMIDE 20 MG PO TABS
20.0000 mg | ORAL_TABLET | Freq: Every day | ORAL | 2 refills | Status: DC | PRN
  Filled 2022-03-04: qty 90, 90d supply, fill #0

## 2022-02-08 MED ORDER — FOLIC ACID 1 MG PO TABS
1.0000 mg | ORAL_TABLET | Freq: Every day | ORAL | 2 refills | Status: DC
  Filled 2022-02-08: qty 90, 90d supply, fill #0

## 2022-02-08 MED ORDER — METOPROLOL SUCCINATE ER 50 MG PO TB24
50.0000 mg | ORAL_TABLET | Freq: Every day | ORAL | 2 refills | Status: DC
  Filled 2022-02-08: qty 30, 30d supply, fill #0

## 2022-02-08 MED ORDER — APIXABAN 5 MG PO TABS
5.0000 mg | ORAL_TABLET | Freq: Two times a day (BID) | ORAL | 1 refills | Status: DC
  Filled 2022-02-08: qty 60, 30d supply, fill #0

## 2022-02-09 ENCOUNTER — Other Ambulatory Visit (HOSPITAL_COMMUNITY): Payer: Self-pay

## 2022-02-11 ENCOUNTER — Other Ambulatory Visit (HOSPITAL_COMMUNITY): Payer: Self-pay

## 2022-02-11 ENCOUNTER — Other Ambulatory Visit: Payer: Self-pay | Admitting: Emergency Medicine

## 2022-02-11 ENCOUNTER — Other Ambulatory Visit: Payer: Self-pay | Admitting: Hematology

## 2022-02-12 ENCOUNTER — Other Ambulatory Visit (HOSPITAL_COMMUNITY): Payer: Self-pay

## 2022-02-12 ENCOUNTER — Encounter: Payer: Self-pay | Admitting: Hematology

## 2022-02-12 ENCOUNTER — Other Ambulatory Visit: Payer: Self-pay

## 2022-02-12 MED ORDER — SPIRIVA RESPIMAT 2.5 MCG/ACT IN AERS: 2.0000 | INHALATION_SPRAY | Freq: Every day | RESPIRATORY_TRACT | 0 refills | Status: DC

## 2022-02-12 MED FILL — Folic Acid Tab 1 MG: ORAL | 90 days supply | Qty: 90 | Fill #0 | Status: CN

## 2022-02-25 ENCOUNTER — Encounter: Payer: Self-pay | Admitting: Hematology

## 2022-03-04 ENCOUNTER — Ambulatory Visit (HOSPITAL_COMMUNITY)
Admission: RE | Admit: 2022-03-04 | Discharge: 2022-03-04 | Disposition: A | Discharge status: Home / Self Care | Payer: HMO | Source: Ambulatory Visit | Attending: Hematology | Admitting: Hematology

## 2022-03-04 DIAGNOSIS — I10 Essential (primary) hypertension: Secondary | ICD-10-CM | POA: Diagnosis not present

## 2022-03-04 DIAGNOSIS — C3411 Malignant neoplasm of upper lobe, right bronchus or lung: Secondary | ICD-10-CM | POA: Insufficient documentation

## 2022-03-04 DIAGNOSIS — Z87891 Personal history of nicotine dependence: Secondary | ICD-10-CM | POA: Diagnosis not present

## 2022-03-04 DIAGNOSIS — I340 Nonrheumatic mitral (valve) insufficiency: Secondary | ICD-10-CM | POA: Insufficient documentation

## 2022-03-04 DIAGNOSIS — I252 Old myocardial infarction: Secondary | ICD-10-CM | POA: Diagnosis not present

## 2022-03-04 DIAGNOSIS — J449 Chronic obstructive pulmonary disease, unspecified: Secondary | ICD-10-CM | POA: Diagnosis not present

## 2022-03-04 DIAGNOSIS — Z01810 Encounter for preprocedural cardiovascular examination: Secondary | ICD-10-CM | POA: Insufficient documentation

## 2022-03-04 DIAGNOSIS — I2510 Atherosclerotic heart disease of native coronary artery without angina pectoris: Secondary | ICD-10-CM | POA: Insufficient documentation

## 2022-03-04 DIAGNOSIS — E785 Hyperlipidemia, unspecified: Secondary | ICD-10-CM | POA: Insufficient documentation

## 2022-03-04 DIAGNOSIS — Z0189 Encounter for other specified special examinations: Secondary | ICD-10-CM

## 2022-03-04 DIAGNOSIS — I4891 Unspecified atrial fibrillation: Secondary | ICD-10-CM | POA: Insufficient documentation

## 2022-03-04 LAB — ECHOCARDIOGRAM COMPLETE
Area-P 1/2: 2.99 cm2
Calc EF: 53.2 %
Est EF: 55
S' Lateral: 3.1 cm
Single Plane A2C EF: 50.2 %
Single Plane A4C EF: 55.2 %

## 2022-03-04 NOTE — Progress Notes (Signed)
*  PRELIMINARY RESULTS* Echocardiogram 2D Echocardiogram has been performed.  Darryl Ward 03/04/2022, 12:23 PM

## 2022-03-05 ENCOUNTER — Other Ambulatory Visit: Payer: Self-pay

## 2022-03-05 ENCOUNTER — Other Ambulatory Visit (HOSPITAL_COMMUNITY): Payer: Self-pay

## 2022-03-05 NOTE — Progress Notes (Signed)
Pharmacist Chemotherapy Monitoring - Initial Assessment    Anticipated start date: 03/11/2022   The following has been reviewed per standard work regarding the patient's treatment regimen: The patient's diagnosis, treatment plan and drug doses, and organ/hematologic function Lab orders and baseline tests specific to treatment regimen  The treatment plan start date, drug sequencing, and pre-medications Prior authorization status  Patient's documented medication list, including drug-drug interaction screen and prescriptions for anti-emetics and supportive care specific to the treatment regimen The drug concentrations, fluid compatibility, administration routes, and timing of the medications to be used The patient's access for treatment and lifetime cumulative dose history, if applicable  The patient's medication allergies and previous infusion related reactions, if applicable   Changes made to treatment plan:  N/A  Follow up needed:  N/A   Wynona Neat, [facility], 03/05/2022  4:20 PM

## 2022-03-09 ENCOUNTER — Other Ambulatory Visit (HOSPITAL_COMMUNITY): Payer: Self-pay

## 2022-03-11 ENCOUNTER — Inpatient Hospital Stay: Payer: HMO | Attending: Hematology

## 2022-03-11 ENCOUNTER — Ambulatory Visit (HOSPITAL_COMMUNITY)
Admission: RE | Admit: 2022-03-11 | Discharge: 2022-03-11 | Disposition: A | Discharge status: Home / Self Care | Payer: HMO | Source: Ambulatory Visit | Attending: Hematology | Admitting: Hematology

## 2022-03-11 ENCOUNTER — Encounter: Payer: Self-pay | Admitting: Hematology

## 2022-03-11 ENCOUNTER — Other Ambulatory Visit: Payer: Self-pay

## 2022-03-11 ENCOUNTER — Other Ambulatory Visit (HOSPITAL_COMMUNITY): Payer: Self-pay

## 2022-03-11 ENCOUNTER — Inpatient Hospital Stay: Payer: HMO

## 2022-03-11 ENCOUNTER — Inpatient Hospital Stay (HOSPITAL_BASED_OUTPATIENT_CLINIC_OR_DEPARTMENT_OTHER): Payer: HMO | Admitting: Hematology

## 2022-03-11 VITALS — BP 117/61 | HR 55 | Temp 96.1°F | Resp 18

## 2022-03-11 VITALS — BP 124/72 | HR 55 | Temp 96.8°F | Resp 16 | Wt 165.4 lb

## 2022-03-11 DIAGNOSIS — I714 Abdominal aortic aneurysm, without rupture, unspecified: Secondary | ICD-10-CM | POA: Insufficient documentation

## 2022-03-11 DIAGNOSIS — E785 Hyperlipidemia, unspecified: Secondary | ICD-10-CM | POA: Diagnosis not present

## 2022-03-11 DIAGNOSIS — I10 Essential (primary) hypertension: Secondary | ICD-10-CM | POA: Insufficient documentation

## 2022-03-11 DIAGNOSIS — J449 Chronic obstructive pulmonary disease, unspecified: Secondary | ICD-10-CM | POA: Diagnosis not present

## 2022-03-11 DIAGNOSIS — I4891 Unspecified atrial fibrillation: Secondary | ICD-10-CM | POA: Diagnosis not present

## 2022-03-11 DIAGNOSIS — Z5112 Encounter for antineoplastic immunotherapy: Secondary | ICD-10-CM | POA: Diagnosis not present

## 2022-03-11 DIAGNOSIS — Z8673 Personal history of transient ischemic attack (TIA), and cerebral infarction without residual deficits: Secondary | ICD-10-CM | POA: Diagnosis not present

## 2022-03-11 DIAGNOSIS — F17210 Nicotine dependence, cigarettes, uncomplicated: Secondary | ICD-10-CM | POA: Diagnosis not present

## 2022-03-11 DIAGNOSIS — S6991XA Unspecified injury of right wrist, hand and finger(s), initial encounter: Secondary | ICD-10-CM | POA: Insufficient documentation

## 2022-03-11 DIAGNOSIS — I2510 Atherosclerotic heart disease of native coronary artery without angina pectoris: Secondary | ICD-10-CM | POA: Insufficient documentation

## 2022-03-11 DIAGNOSIS — F329 Major depressive disorder, single episode, unspecified: Secondary | ICD-10-CM | POA: Diagnosis not present

## 2022-03-11 DIAGNOSIS — D508 Other iron deficiency anemias: Secondary | ICD-10-CM

## 2022-03-11 DIAGNOSIS — D649 Anemia, unspecified: Secondary | ICD-10-CM

## 2022-03-11 DIAGNOSIS — C3411 Malignant neoplasm of upper lobe, right bronchus or lung: Secondary | ICD-10-CM

## 2022-03-11 DIAGNOSIS — Z801 Family history of malignant neoplasm of trachea, bronchus and lung: Secondary | ICD-10-CM | POA: Diagnosis not present

## 2022-03-11 DIAGNOSIS — Z95828 Presence of other vascular implants and grafts: Secondary | ICD-10-CM

## 2022-03-11 DIAGNOSIS — I252 Old myocardial infarction: Secondary | ICD-10-CM | POA: Diagnosis not present

## 2022-03-11 LAB — CBC WITH DIFFERENTIAL/PLATELET
Abs Immature Granulocytes: 0.02 10*3/uL (ref 0.00–0.07)
Basophils Absolute: 0.1 10*3/uL (ref 0.0–0.1)
Basophils Relative: 1 %
Eosinophils Absolute: 0.2 10*3/uL (ref 0.0–0.5)
Eosinophils Relative: 2 %
HCT: 32.8 % — ABNORMAL LOW (ref 39.0–52.0)
Hemoglobin: 10.5 g/dL — ABNORMAL LOW (ref 13.0–17.0)
Immature Granulocytes: 0 %
Lymphocytes Relative: 17 %
Lymphs Abs: 1.3 10*3/uL (ref 0.7–4.0)
MCH: 30.7 pg (ref 26.0–34.0)
MCHC: 32 g/dL (ref 30.0–36.0)
MCV: 95.9 fL (ref 80.0–100.0)
Monocytes Absolute: 0.6 10*3/uL (ref 0.1–1.0)
Monocytes Relative: 7 %
Neutro Abs: 5.5 10*3/uL (ref 1.7–7.7)
Neutrophils Relative %: 73 %
Platelets: 136 10*3/uL — ABNORMAL LOW (ref 150–400)
RBC: 3.42 MIL/uL — ABNORMAL LOW (ref 4.22–5.81)
RDW: 14.7 % (ref 11.5–15.5)
WBC: 7.6 10*3/uL (ref 4.0–10.5)
nRBC: 0 % (ref 0.0–0.2)

## 2022-03-11 LAB — COMPREHENSIVE METABOLIC PANEL
ALT: 17 U/L (ref 0–44)
AST: 26 U/L (ref 15–41)
Albumin: 3.4 g/dL — ABNORMAL LOW (ref 3.5–5.0)
Alkaline Phosphatase: 50 U/L (ref 38–126)
Anion gap: 7 (ref 5–15)
BUN: 15 mg/dL (ref 8–23)
CO2: 25 mmol/L (ref 22–32)
Calcium: 8.7 mg/dL — ABNORMAL LOW (ref 8.9–10.3)
Chloride: 105 mmol/L (ref 98–111)
Creatinine, Ser: 0.98 mg/dL (ref 0.61–1.24)
GFR, Estimated: >60 mL/min (ref >60)
Glucose, Bld: 104 mg/dL — ABNORMAL HIGH (ref 70–99)
Potassium: 3.9 mmol/L (ref 3.5–5.1)
Sodium: 137 mmol/L (ref 135–145)
Total Bilirubin: 1 mg/dL (ref 0.3–1.2)
Total Protein: 6.6 g/dL (ref 6.5–8.1)

## 2022-03-11 LAB — FERRITIN: Ferritin: 117 ng/mL (ref 24–336)

## 2022-03-11 LAB — TSH: TSH: 0.936 u[IU]/mL (ref 0.350–4.500)

## 2022-03-11 LAB — IRON AND TIBC
Iron: 70 ug/dL (ref 45–182)
Saturation Ratios: 24 % (ref 17.9–39.5)
TIBC: 297 ug/dL (ref 250–450)
UIBC: 227 ug/dL

## 2022-03-11 MED ORDER — SODIUM CHLORIDE 0.9 % IV SOLN: Freq: Once | INTRAVENOUS | Status: AC

## 2022-03-11 MED ORDER — HEPARIN SOD (PORK) LOCK FLUSH 100 UNIT/ML IV SOLN
500.0000 [IU] | Freq: Once | INTRAVENOUS | Status: AC | PRN
  Administered 2022-03-11: 500 [IU]

## 2022-03-11 MED ORDER — ESCITALOPRAM OXALATE 10 MG PO TABS
10.0000 mg | ORAL_TABLET | Freq: Every day | ORAL | 3 refills | Status: DC
  Filled 2022-03-11: qty 90, 90d supply, fill #0
  Filled 2022-06-06: qty 90, 90d supply, fill #1

## 2022-03-11 MED ORDER — SODIUM CHLORIDE 0.9% FLUSH
10.0000 mL | INTRAVENOUS | Status: DC | PRN
  Administered 2022-03-11: 10 mL via INTRAVENOUS

## 2022-03-11 MED ORDER — SODIUM CHLORIDE 0.9% FLUSH
10.0000 mL | INTRAVENOUS | Status: DC | PRN
  Administered 2022-03-11: 10 mL

## 2022-03-11 MED ORDER — SODIUM CHLORIDE 0.9 % IV SOLN
200.0000 mg | Freq: Once | INTRAVENOUS | Status: AC
  Administered 2022-03-11: 200 mg via INTRAVENOUS
  Filled 2022-03-11: qty 8

## 2022-03-11 NOTE — Patient Instructions (Signed)
[facility]  Discharge Instructions: Thank you for choosing [facility] to provide your oncology and hematology care.  If you have a lab appointment with the Cancer Center, please come in thru the Main Entrance and check in at the main information desk.  Wear comfortable clothing and clothing appropriate for easy access to any Portacath or PICC line.   We strive to give you quality time with your provider. You may need to reschedule your appointment if you arrive late (15 or more minutes).  Arriving late affects you and other patients whose appointments are after yours.  Also, if you miss three or more appointments without notifying the office, you may be dismissed from the clinic at the provider's discretion.      For prescription refill requests, have your pharmacy contact our office and allow 72 hours for refills to be completed.    To help prevent nausea and vomiting after your treatment, we encourage you to take your nausea medication as directed.  BELOW ARE SYMPTOMS THAT SHOULD BE REPORTED IMMEDIATELY: *FEVER GREATER THAN 100.4 F (38 C) OR HIGHER *CHILLS OR SWEATING *NAUSEA AND VOMITING THAT IS NOT CONTROLLED WITH YOUR NAUSEA MEDICATION *UNUSUAL SHORTNESS OF BREATH *UNUSUAL BRUISING OR BLEEDING *URINARY PROBLEMS (pain or burning when urinating, or frequent urination) *BOWEL PROBLEMS (unusual diarrhea, constipation, pain near the anus) TENDERNESS IN MOUTH AND THROAT WITH OR WITHOUT PRESENCE OF ULCERS (sore throat, sores in mouth, or a toothache) UNUSUAL RASH, SWELLING OR PAIN  UNUSUAL VAGINAL DISCHARGE OR ITCHING   Items with * indicate a potential emergency and should be followed up as soon as possible or go to the Emergency Department if any problems should occur.  Please show the CHEMOTHERAPY ALERT CARD or IMMUNOTHERAPY ALERT CARD at check-in to the Emergency Department and triage nurse.  Should you have questions after your visit or need to  cancel or reschedule your appointment, please contact [facility] [phone]  and follow the prompts.  Office hours are 8:00 a.m. to 4:30 p.m. Monday - Friday. Please note that voicemails left after 4:00 p.m. may not be returned until the following business day.  We are closed weekends and major holidays. You have access to a nurse at all times for urgent questions. Please call the main number to the clinic [phone] and follow the prompts.  For any non-urgent questions, you may also contact your provider using MyChart. We now offer e-Visits for anyone 18 and older to request care online for non-urgent symptoms. For details visit [email or web address].   Also download the MyChart app! Go to the app store, search "MyChart", open the app, select [facility], and log in with your MyChart username and password.   

## 2022-03-11 NOTE — Progress Notes (Signed)
Called to patient's room by significant other, [name].  Found in floor of shower in a seated position.  States that he stepped back and tripped over the lip of the shower.  Denied dizziness or LOC.  States he did not hit his head.  Vital signs performed and WNL and is AO x 3.  Patient assisted to a standing position in stable condition and was assisted to his chair using cane.  Only complaint reported was swelling and pain of 4/10 to right pinky finger.  Otherwise, no other complaints noted.  Patient to remain monitored during treatment and will request assistance to bathroom in the future.  Dr. Delton Coombes made aware and order received to complete x-ray to right hand.  Ice applied to finger.

## 2022-03-11 NOTE — Patient Instructions (Addendum)
[place] at [facility] Discharge Instructions   You were seen and examined today by Dr. Delton Coombes.  He reviewed the results of your lab work which are normal/stable.   We will proceed with your first treatment of Keytruda today.    Thank you for choosing [place] at [facility] to provide your oncology and hematology care.  To afford each patient quality time with our provider, please arrive at least 15 minutes before your scheduled appointment time.   If you have a lab appointment with the [facility] please come in thru the Main Entrance and check in at the main information desk.  You need to re-schedule your appointment should you arrive 10 or more minutes late.  We strive to give you quality time with our providers, and arriving late affects you and other patients whose appointments are after yours.  Also, if you no show three or more times for appointments you may be dismissed from the clinic at the providers discretion.     Again, thank you for choosing [facility].  Our hope is that these requests will decrease the amount of time that you wait before being seen by our physicians.       _____________________________________________________________  Should you have questions after your visit to [facility], please contact our office at [phone] and follow the prompts.  Our office hours are 8:00 a.m. and 4:30 p.m. Monday - Friday.  Please note that voicemails left after 4:00 p.m. may not be returned until the following business day.  We are closed weekends and major holidays.  You do have access to a nurse 24-7, just call the main number to the clinic [phone] and do not press any options, hold on the line and a nurse will answer the phone.    For prescription refill requests, have your pharmacy contact our office and allow 72 hours.    Due to Covid, you will need to wear a mask upon entering the  hospital. If you do not have a mask, a mask will be given to you at the Main Entrance upon arrival. For doctor visits, patients may have 1 support person age 42 or older with them. For treatment visits, patients can not have anyone with them due to social distancing guidelines and our immunocompromised population.

## 2022-03-11 NOTE — Progress Notes (Signed)
[facility] 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  Occasional additional small pulmonary nodules measuring 0.4 cm and smaller. - Bronchoscopy (06/11/2021): RUL nodule brushing and FNA: Malignant cells with features of adenocarcinoma. - PET scan (06/22/2021): Subsolid nodular lesion in the right upper lobe, hypermetabolic SUV 15.  Small right hilar and infrahilar lymph nodes with SUV 4.03 concerning for metastatic adenopathy.  Small hypermetabolic left parotid gland lesion, consistent with previous history of Wharton's tumor.  Hypermetabolic focus in the transverse colon SUV 8.69. - MRI of the brain (07/11/2021): No evidence of metastatic disease.  Right sphenoid wing meningioma stable since 2022. - He was evaluated by Dr. Roxan Hockey and was recommended to undergo neoadjuvant chemoimmunotherapy followed by surgical resection. - 3 cycles of carboplatin, pemetrexed from 08/06/2021 through 10/02/2021 (opdivo given with only cycle 2, discontinued for cycle 3 due to cardiac problems) - NGS testing not performed due to insufficient sample. - Guardant360: Negative for EGFR and ALK.  MSI high not detected. - Right upper lobectomy and lymph node dissection (11/12/2021): 1.2 cm invasive adenocarcinoma, moderately differentiated, margins negative, pT1b pN0, 0/17  lymph nodes positive from stations 4R, 7, 8, 9, 10R, 11 R and 12 R.  Negative for visceral pleural involvement, LVI.  PD-L1 TPS is 1%.    Social/family history: - Lives at home with his wife.  Uses cane occasionally after he had stroke in January 2022.  He has retired after working in [employer].  He is current active smoker, 1 pack/day for 60 years.  No exposure to chemicals. - Brother died of metastatic cancer.  Maternal uncle had lung cancer.  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  He is still somewhat weak but is doing physical therapy twice weekly.  He uses a cane for ambulation. - Reviewed labs today which showed normal LFTs and CBC.  Mild normocytic anemia. - TSH is 0.9. - We will proceed with first cycle of adjuvant Keytruda today.  We discussed side effects of Keytruda in detail.  RTC 3 weeks for follow-up.  2.  Normocytic anemia: - Will check ferritin, iron panel, U04 and folic acid.   3.  Atrial fibrillation: - 2D echo showed LVEF of 35-40% in August. - Cardioversion was attempted previously but he was thought to be in normal sinus rhythm. - Reviewed repeat 2D echo from 03/12/2022.  EF improved to 55%.   4.  Depression: - Continue Lexapro 10 mg daily which is helping.    Orders Placed This Encounter  Procedures   Vitamin B12    Standing Status:   Future    Standing Expiration Date:   03/11/2023   Ferritin    Standing Status:   Future    Standing Expiration Date:   03/11/2023   Iron and TIBC (  [facility] ONLY)    Standing Status:   Future    Standing Expiration Date:   03/12/2023   Folate    Standing Status:   Future    Standing Expiration Date:   03/11/2023      Kaleen Odea as a scribe for Derek Jack, MD.,have documented all relevant documentation on the behalf of Derek Jack, MD,as directed by  Derek Jack, MD  while in the presence of Derek Jack, MD.   I, Derek Jack MD, have reviewed the above documentation for accuracy and completeness, and I agree with the above.   Derek Jack, MD   2/19/202412:54 PM  CHIEF COMPLAINT:   Diagnosis: right upper lobe lung adenocarcinoma    Cancer Staging  Primary adenocarcinoma of upper lobe of right lung [facility]) Staging form: Lung, AJCC 8th Edition - Clinical stage from 06/26/2021: cT1, cN1, cM0 - Unsigned    Prior Therapy:  1.  Neoadjuvant chemoimmunotherapy with carboplatin, pemetrexed  Current Therapy:  Adjuvant pembrolizumab    HISTORY OF PRESENT ILLNESS:   Oncology History  Primary adenocarcinoma of upper lobe of right lung ([place])  06/26/2021 Initial Diagnosis   Primary adenocarcinoma of upper lobe of right lung ([place])   08/06/2021 - 08/27/2021 Chemotherapy   Patient is on Treatment Plan : LUNG NSCLC Pemetrexed + Carboplatin q21d x 4 Cycles     08/06/2021 - 10/02/2021 Chemotherapy   Patient is on Treatment Plan : LUNG NSCLC Pemetrexed + Carboplatin q21d x 4 Cycles     03/11/2022 -  Chemotherapy   Patient is on Treatment Plan : LUNG NSCLC Pembrolizumab (200) q21d        INTERVAL HISTORY:   Baylen is a 77 y.o. male presenting to clinic today for follow up of ight upper lobe lung adenocarcinoma . He was last seen by me on 02/07/2022.  Today, he states that he is doing well overall. His appetite level is at 70%. His energy level is at 40%.He has been feeling stronger since surgery but is still week.  His eating has picked up and he gained 4 pounds.  Also drinking 2 boost per day.  Denies any falls.  PAST MEDICAL HISTORY:   Past Medical History: Past Medical History:  Diagnosis Date   AAA (abdominal aortic aneurysm) ([place])    Arthritis    Cancer ([place])    Carotid artery disease ([place])    Nonobstructive   Cataract    COPD (chronic obstructive pulmonary disease) ([place])    Coronary atherosclerosis of native coronary artery     PTCA small diagonal 2007 otherwise nonobstructive CAD   Depression    Dysrhythmia    Essential hypertension, benign    Hyperlipidemia    NSTEMI (non-ST elevated myocardial infarction) ([place]) 2007   Stroke [facility]) 2004   TIA (transient ischemic attack) 2006    Surgical History: Past Surgical History:  Procedure Laterality Date   AORTA - BILATERAL FEMORAL ARTERY BYPASS GRAFT  01/08/2012   Procedure: AORTA BIFEMORAL BYPASS GRAFT;  Surgeon: Elam Dutch, MD;  Location: [facility] OR;  Service: Vascular;  Laterality: Bilateral;  using 18x106mm x 40cm Hemashield Gold Vascular Graft with Endarterectomy, Thombectomy and  Reimplantation of Inferior Mesenteric Artery   BACK SURGERY  2021   BRONCHIAL BIOPSY  06/11/2021   Procedure: BRONCHIAL BIOPSIES;  Surgeon: [name], MD;  Location: [facility] ENDOSCOPY;  Service: Pulmonary;;   BRONCHIAL BRUSHINGS  06/11/2021   Procedure: BRONCHIAL BRUSHINGS;  Surgeon: [name], MD;  Location: [facility] ENDOSCOPY;  Service: Pulmonary;;   BRONCHIAL NEEDLE ASPIRATION BIOPSY  06/11/2021   Procedure: BRONCHIAL NEEDLE ASPIRATION BIOPSIES;  Surgeon: [name], MD;  Location: [facility] ENDOSCOPY;  Service: Pulmonary;;   CARDIOVERSION N/A 09/20/2021   Procedure: CARDIOVERSION;  Surgeon: Satira Sark, MD;  Location: [facility] ORS;  Service: Cardiovascular;  Laterality: N/A;   COLONOSCOPY N/A 04/14/2019   Procedure: COLONOSCOPY;  Surgeon: Daneil Dolin, MD;  Location: [facility] ENDO SUITE;  Service: Endoscopy;  Laterality: N/A;  9:30   COLONOSCOPY WITH PROPOFOL N/A 07/25/2021   Procedure: COLONOSCOPY WITH PROPOFOL;  Surgeon: Eloise Harman, DO;  Location: [facility] END[facility];  Service: Endoscopy;  Laterality: N/A;  1:00pm   FIDUCIAL MARKER PLACEMENT  06/11/2021   Procedure: FIDUCIAL MARKER PLACEMENT;  Surgeon: [name], MD;  Location: [facility] ENDOSCOPY;  Service: Pulmonary;;   INTERCOSTAL NERVE BLOCK Right 11/12/2021   Procedure: INTERCOSTAL NERVE BLOCK;  Surgeon: [name], MD;  Location: [facility];  Service: Thoracic;  Laterality: Right;   IR IMAGING GUIDED PORT INSERTION  07/31/2021   Left cataract surgery     LYMPH NODE DISSECTION Right 11/12/2021   Procedure: LYMPH NODE DISSECTION;  Surgeon: [name], MD;  Location: [facility];  Service: Thoracic;  Laterality: Right;   POLYPECTOMY  04/14/2019   Procedure: POLYPECTOMY;  Surgeon: Daneil Dolin, MD;  [facility];  Service: Endoscopy;;   POLYPECTOMY  07/25/2021   Procedure: POLYPECTOMY;  Surgeon: Eloise Harman, DO;  [facility];  Service: Endoscopy;;   TEE WITHOUT CARDIOVERSION N/A 09/20/2021   Procedure: TRANSESOPHAGEAL ECHOCARDIOGRAM (TEE);  Surgeon: Satira Sark, MD;  Location: [facility]S;  Service: Cardiovascular;  Laterality: N/A;   TRANSFORAMINAL LUMBAR INTERBODY FUSION (TLIF) WITH PEDICLE SCREW FIXATION 1 LEVEL N/A 04/27/2020   Procedure: Transforaminal Lumbar Interbody Fusion Lumbar Five-Sacral One;  Surgeon: Vallarie Mare, MD;  Location: [facility];  Service: Neurosurgery;  Laterality: N/A;   VIDEO BRONCHOSCOPY WITH INSERTION OF INTERBRONCHIAL VALVE (IBV) N/A 11/22/2021   Procedure: VIDEO BRONCHOSCOPY WITH INSERTION OF INTERBRONCHIAL VALVE (IBV);  Surgeon: [name], MD;  Location: [facility] OR;  Service: Thoracic;  Laterality: N/A;   VIDEO BRONCHOSCOPY WITH INSERTION OF INTERBRONCHIAL VALVE (IBV) N/A 01/24/2022   Procedure: VIDEO BRONCHOSCOPY WITH REMOVAL OF INTERBRONCHIAL VALVE (IBV);  Surgeon: [name], MD;  Location: [facility] OR;  Service: Thoracic;  Laterality: N/A;   VIDEO BRONCHOSCOPY WITH RADIAL ENDOBRONCHIAL ULTRASOUND  06/11/2021   Procedure: VIDEO BRONCHOSCOPY WITH RADIAL ENDOBRONCHIAL ULTRASOUND;  Surgeon: [name], MD;  Location: [facility] ENDOSCOPY;  Service: Pulmonary;;    Social History: Social History   Socioeconomic History   Marital status: Divorced    Spouse name: Not on file   Number of children: 1   Years of education: 11    Highest education level: 11th grade  Occupational History    Employer: Probation officer  Tobacco Use   Smoking status: Former    Packs/day: 1.00    Years: 40.00    Total pack years: 40.00    Types: Cigarettes    Quit date: 07/2021    Years since quitting: 0.6   Smokeless tobacco: Never   Tobacco comments:    1 pack of cigarettes smoked daily. 07/17/21 ARJ, RN   Vaping Use   Vaping Use: Never used  Substance and Sexual Activity   Alcohol use: No    Comment: Prior history of regular alcohol use   Drug use: No   Sexual activity: Not Currently  Other Topics Concern   Not on file  Social History Narrative   Not on file   Social Determinants of Health   Financial Resource Strain: Low Risk  (11/29/2019)   Overall Financial Resource Strain (CARDIA)    Difficulty of Paying Living Expenses: Not hard at all  Food Insecurity: No Food Insecurity (11/29/2021)   Hunger Vital Sign    Worried About Running Out of Food in the Last Year: Never true    Ran Out of Food in the Last Year: Never true  Transportation Needs: No Transportation Needs (11/29/2021)   PRAPARE - Hydrologist (Medical): No    Lack of Transportation (Non-Medical): No  Physical Activity: Inactive (11/29/2019)   Exercise Vital Sign    Days of Exercise per Week: 0 days    Minutes of Exercise per Session: 0 min  Stress: No Stress Concern Present (11/29/2019)   [place]    Feeling of Stress : Only a little  Social Connections: Moderately Isolated (11/29/2019)   Social Connection and Isolation Panel [NHANES]    Frequency of Communication with Friends and Family: More than three times a week    Frequency of Social Gatherings with Friends and Family: More than three times a week    Attends Religious Services: 1 to 4 times per year    Active Member of [employer] or Organizations: No    Attends Archivist Meetings: Never     Marital Status: Divorced  Human resources officer Violence: Not At Risk (11/13/2021)   Humiliation, Afraid, Rape, and Kick questionnaire    Fear of Current or Ex-Partner: No    Emotionally Abused: No    Physically Abused: No    Sexually Abused: No    Family History: Family History  Problem Relation Age of Onset   Hyperlipidemia Sister    Heart attack Brother 45   Cancer - Colon Neg Hx     Current Medications:  Current Outpatient Medications:    acetaminophen (TYLENOL) 500 MG tablet, Take 2 tablets (1,000 mg total) by mouth every 6 (six) hours as needed. (Patient taking differently: Take 1,000 mg by mouth 2 (two) times daily as needed for moderate pain.), Disp: 30 tablet, Rfl: 0   albuterol (VENTOLIN HFA) 108 (90 Base) MCG/ACT inhaler, Inhale 2 puffs into the lungs every 6 (six) hours as needed for wheezing or shortness of breath., Disp: 6.7 g, Rfl: 6   apixaban (ELIQUIS) 5 MG TABS tablet, Take 1 tablet (5 mg total) by mouth 2 (two) times daily., Disp: 180 tablet, Rfl: 1   apixaban (ELIQUIS) 5 MG TABS tablet, Take 1 tablet (5 mg total) by mouth 2 (two) times daily., Disp: 60 tablet, Rfl: 1   atorvastatin (LIPITOR) 80 MG tablet, Take 80 mg by mouth daily., Disp: , Rfl:    cyclobenzaprine (FLEXERIL) 10 MG tablet, Take 10 mg by mouth 2 (two) times daily as needed for muscle spasms., Disp: , Rfl:    cyclobenzaprine (FLEXERIL) 10 MG tablet, Take 1 tablet (10 mg total) by mouth 2 (two) times daily as needed., Disp: 180 tablet, Rfl: 3   folic acid (FOLVITE) 1 MG tablet, Take 1 tablet (1 mg total) by mouth daily., Disp: 90 tablet, Rfl: 2   furosemide (LASIX) 20 MG tablet, Take 1 tablet (20 mg total) by mouth daily as needed. (Patient taking differently: Take 20 mg by mouth in the morning.), Disp: 90 tablet, Rfl: 2   furosemide (  LASIX) 20 MG tablet, Take 1 tablet (20 mg total) by mouth daily as needed., Disp: 30 tablet, Rfl: 2   lidocaine-prilocaine (EMLA) cream, Apply a small amount to port a cath site  and cover with plastic wrap 1 hour prior to infusion appointments, Disp: 30 g, Rfl: 3   Magnesium Oxide 400 MG CAPS, Take 1 capsule (400 mg total) by mouth daily., Disp: , Rfl: 0   metoprolol succinate (TOPROL-XL) 50 MG 24 hr tablet, Take 1.5 tablets (75 mg total) by mouth daily. Take with or immediately following a meal., Disp: 135 tablet, Rfl: 3   metoprolol succinate (TOPROL-XL) 50 MG 24 hr tablet, Take 1 tablet (50 mg total) by mouth daily., Disp: 30 tablet, Rfl: 2   prochlorperazine (COMPAZINE) 10 MG tablet, Take 1 tablet (10 mg total) by mouth every 6 (six) hours as needed for nausea/vomiting, Disp: 60 tablet, Rfl: 3   Tiotropium Bromide Monohydrate (SPIRIVA RESPIMAT) 2.5 MCG/ACT AERS, Inhale 2 puffs into the lungs daily., Disp: 4 g, Rfl: 0   Tiotropium Bromide Monohydrate (SPIRIVA RESPIMAT) 2.5 MCG/ACT AERS, Inhale 2 puffs into the lungs daily., Disp: 4 g, Rfl: 0   traMADol (ULTRAM) 50 MG tablet, Take 1 tablet (50 mg total) by mouth every 6 (six) hours as needed (mild pain)., Disp: 30 tablet, Rfl: 0   escitalopram (LEXAPRO) 10 MG tablet, Take 1 tablet (10 mg total) by mouth daily., Disp: 90 tablet, Rfl: 3 No current facility-administered medications for this visit.  Facility-Administered Medications Ordered in Other Visits:    heparin lock flush 100 unit/mL, 500 Units, Intracatheter, Once PRN, Derek Jack, MD   pembrolizumab [facility]) 200 mg in sodium chloride 0.9 % 50 mL chemo infusion, 200 mg, Intravenous, Once, Derek Jack, MD   sodium chloride flush (NS) 0.9 % injection 10 mL, 10 mL, Intracatheter, PRN, Derek Jack, MD   Allergies: No Known Allergies  REVIEW OF SYSTEMS:   Review of Systems  Constitutional:  Negative for chills, fatigue and fever.  HENT:   Negative for lump/mass, mouth sores, nosebleeds, sore throat and trouble swallowing.   Eyes:  Negative for eye problems.  Respiratory:  Negative for cough and shortness of breath.   Cardiovascular:   Negative for chest pain, leg swelling and palpitations.  Gastrointestinal:  Negative for abdominal pain, constipation, diarrhea, nausea and vomiting.  Genitourinary:  Negative for bladder incontinence, difficulty urinating, dysuria, frequency, hematuria and nocturia.   Musculoskeletal:  Negative for arthralgias, back pain, flank pain, myalgias and neck pain.  Skin:  Negative for itching and rash.  Neurological:  Negative for dizziness, headaches and numbness.  Hematological:  Does not bruise/bleed easily.  Psychiatric/Behavioral:  Positive for sleep disturbance (Faling/staying). Negative for depression and suicidal ideas. The patient is not nervous/anxious.   All other systems reviewed and are negative.    VITALS:   There were no vitals taken for this visit.  Wt Readings from Last 3 Encounters:  03/11/22 165 lb 6.4 oz (75 kg)  02/07/22 161 lb 2.5 oz (73.1 kg)  02/05/22 160 lb 8 oz (72.8 kg)    There is no height or weight on file to calculate BMI.  Performance status (ECOG): 1 - Symptomatic but completely ambulatory  PHYSICAL EXAM:   Physical Exam Vitals and nursing note reviewed. Exam conducted with a chaperone present.  Constitutional:      Appearance: Normal appearance.  Cardiovascular:     Rate and Rhythm: Normal rate and regular rhythm.     Pulses: Normal pulses.  Heart sounds: Normal heart sounds.  Pulmonary:     Effort: Pulmonary effort is normal.     Breath sounds: Normal breath sounds.  Abdominal:     Palpations: Abdomen is soft. There is no hepatomegaly, splenomegaly or mass.     Tenderness: There is no abdominal tenderness.  Musculoskeletal:     Right lower leg: No edema.     Left lower leg: No edema.  Lymphadenopathy:     Cervical: No cervical adenopathy.     Right cervical: No superficial, deep or posterior cervical adenopathy.    Left cervical: No superficial, deep or posterior cervical adenopathy.     Upper Body:     Right upper body: No  supraclavicular or axillary adenopathy.     Left upper body: No supraclavicular or axillary adenopathy.  Neurological:     General: No focal deficit present.     Mental Status: He is alert and oriented to person, place, and time.  Psychiatric:        Mood and Affect: Mood normal.        Behavior: Behavior normal.     LABS:      Latest Ref Rng & Units 03/11/2022   10:22 AM 01/23/2022   10:50 AM 01/08/2022    2:04 PM  CBC  WBC 4.0 - 10.5 K/uL 7.6  7.2  7.8   Hemoglobin 13.0 - 17.0 g/dL 10.5  10.3  10.4   Hematocrit 39.0 - 52.0 % 32.8  32.1  32.0   Platelets 150 - 400 K/uL 136  140  148       Latest Ref Rng & Units 03/11/2022   10:22 AM 01/23/2022   10:50 AM 01/08/2022    2:04 PM  CMP  Glucose 70 - 99 mg/dL 104  101  125   BUN 8 - 23 mg/dL 15  11  10    Creatinine 0.61 - 1.24 mg/dL 0.98  1.04  0.93   Sodium 135 - 145 mmol/L 137  137  139   Potassium 3.5 - 5.1 mmol/L 3.9  4.7  4.8   Chloride 98 - 111 mmol/L 105  106  102   CO2 22 - 32 mmol/L 25  23  23    Calcium 8.9 - 10.3 mg/dL 8.7  8.8  9.3   Total Protein 6.5 - 8.1 g/dL 6.6  6.2    Total Bilirubin 0.3 - 1.2 mg/dL 1.0  0.4    Alkaline Phos 38 - 126 U/L 50  52    AST 15 - 41 U/L 26  26    ALT 0 - 44 U/L 17  18       No results found for: "CEA1", "CEA" / No results found for: "CEA1", "CEA" Lab Results  Component Value Date   PSA1 1.2 11/25/2019   No results found for: "VPX106" No results found for: "CAN125"  No results found for: "TOTALPROTELP", "ALBUMINELP", "A1GS", "A2GS", "BETS", "BETA2SER", "GAMS", "MSPIKE", "SPEI" Lab Results  Component Value Date   TIBC 297 03/11/2022   TIBC 242 (L) 07/13/2020   FERRITIN 117 03/11/2022   FERRITIN 248 07/13/2020   IRONPCTSAT 24 03/11/2022   IRONPCTSAT 20 07/13/2020   No results found for: "LDH"   STUDIES:   ECHOCARDIOGRAM COMPLETE  Result Date: 03/04/2022    ECHOCARDIOGRAM REPORT   Patient Name:   EDRAS WILFORD Date of Exam: 03/04/2022 Medical Rec #:  [id number]        Height:       70.0 in  Accession #:    [id number]      Weight:       161.2 lb Date of Birth:  03/13/1945        BSA:          1.904 m Patient Age:    77 years        BP:           116/69 mmHg Patient Gender: M               HR:           54 bpm. Exam Location:  [facility] Procedure: 2D Echo, Cardiac Doppler and Color Doppler Indications:    C34.11 (ICD-10-CM) - Primary adenocarcinoma of upper lobe of                 right lung, Chemo Z09  History:        Patient has prior history of Echocardiogram examinations, most                 recent 09/20/2021. Previous Myocardial Infarction and CAD, COPD                 and Stroke, Arrythmias:Atrial Fibrillation; Risk                 Factors:Hypertension and Dyslipidemia. Primary adenocarcinoma of                 upper lobe of right lung ([place]), Hx of Tobacco abuse.  Sonographer:    Alvino Chapel RCS Referring Phys: [phone] [place]  1. Left ventricular ejection fraction, by estimation, is approximately 55%. The left ventricle has normal function. The left ventricle demonstrates regional wall motion abnormalities (see scoring diagram/findings for description). Left ventricular diastolic parameters are consistent with Grade II diastolic dysfunction (pseudonormalization).  2. Right ventricular systolic function is normal. The right ventricular size is normal. There is normal pulmonary artery systolic pressure. The estimated right ventricular systolic pressure is 64.3 mmHg.  3. Left atrial size was mildly dilated.  4. The mitral valve is grossly normal. Mild mitral valve regurgitation.  5. The aortic valve is tricuspid. Aortic valve regurgitation is not visualized.  6. Aortic dilatation noted. There is borderline dilatation of the aortic root, measuring 40 mm.  7. The inferior vena cava is dilated in size with >50% respiratory variability, suggesting right atrial pressure of 8 mmHg. Comparison(s): Prior images reviewed side by side. LVEF has improved in  comparison. FINDINGS  Left Ventricle: Left ventricular ejection fraction, by estimation, is 55%. The left ventricle has normal function. The left ventricle demonstrates regional wall motion abnormalities. Global longitudinal strain performed but not reported based on interpreter judgement due to suboptimal tracking. The left ventricular internal cavity size was normal in size. There is no left ventricular hypertrophy. Left ventricular diastolic parameters are consistent with Grade II diastolic dysfunction (pseudonormalization).  LV Wall Scoring: The apical septal segment and apex are hypokinetic. The entire anterior wall, entire lateral wall, anterior septum, entire inferior wall, mid inferoseptal segment, and basal inferoseptal segment are normal. Right Ventricle: The right ventricular size is normal. No increase in right ventricular wall thickness. Right ventricular systolic function is normal. There is normal pulmonary artery systolic pressure. The tricuspid regurgitant velocity is 2.60 m/s, and  with an assumed right atrial pressure of 8 mmHg, the estimated right ventricular systolic pressure is 32.9 mmHg. Left Atrium: Left atrial size was mildly dilated. Right Atrium: Right atrial size was normal in size.  Pericardium: There is no evidence of pericardial effusion. Presence of epicardial fat layer. Mitral Valve: The mitral valve is grossly normal. Mild mitral valve regurgitation. Tricuspid Valve: The tricuspid valve is grossly normal. Tricuspid valve regurgitation is mild. Aortic Valve: The aortic valve is tricuspid. There is mild aortic valve annular calcification. Aortic valve regurgitation is not visualized. Pulmonic Valve: The pulmonic valve was grossly normal. Pulmonic valve regurgitation is trivial. Aorta: Aortic dilatation noted. There is borderline dilatation of the aortic root, measuring 40 mm. Venous: The inferior vena cava is dilated in size with greater than 50% respiratory variability, suggesting  right atrial pressure of 8 mmHg. IAS/Shunts: No atrial level shunt detected by color flow Doppler.  LEFT VENTRICLE PLAX 2D LVIDd:         5.00 cm     Diastology LVIDs:         3.10 cm     LV e' medial:    6.85 cm/s LV PW:         0.90 cm     LV E/e' medial:  14.3 LV IVS:        0.80 cm     LV e' lateral:   10.30 cm/s LVOT diam:     2.20 cm     LV E/e' lateral: 9.5 LV SV:         84 LV SV Index:   44 LVOT Area:     3.80 cm  LV Volumes (MOD) LV vol d, MOD A2C: 84.4 ml LV vol d, MOD A4C: 89.1 ml LV vol s, MOD A2C: 42.0 ml LV vol s, MOD A4C: 39.9 ml LV SV MOD A2C:     42.4 ml LV SV MOD A4C:     89.1 ml LV SV MOD BP:      47.7 ml RIGHT VENTRICLE RV S prime:     13.20 cm/s TAPSE (M-mode): 2.6 cm LEFT ATRIUM             Index        RIGHT ATRIUM           Index LA diam:        4.00 cm 2.10 cm/m   RA Area:     15.70 cm LA Vol (A2C):   75.8 ml 39.80 ml/m  RA Volume:   42.50 ml  22.32 ml/m LA Vol (A4C):   65.1 ml 34.19 ml/m LA Biplane Vol: 76.6 ml 40.23 ml/m  AORTIC VALVE LVOT Vmax:   88.80 cm/s LVOT Vmean:  53.200 cm/s LVOT VTI:    0.220 m  AORTA 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

## 2022-03-11 NOTE — Progress Notes (Signed)
Patients port flushed without difficulty.  Good blood return noted with no bruising or swelling noted at site.  Patient remains accessed for chemotherapy treatment.  

## 2022-03-11 NOTE — Progress Notes (Signed)
Patient ambulated to bathroom without difficulty.  Skin assessment for lower back/sacrum area assessed with no bruising or redness.  Skin assessment for upper back with no bruising or redness.  Patient denied pain or tenderness with both areas.  Patients finger assessed with no bruising or swelling and stated some tenderness when he tries to bend his fingers.  Patient instructed to go to radiology for hand xray with understanding verbalized.   Patient tolerated chemotherapy with no complaints voiced.  Side effects with management reviewed with understanding verbalized.  Port site clean and dry with no bruising or swelling noted at site.  Good blood return noted before and after administration of chemotherapy.  Band aid applied.  Patient left in satisfactory condition with VSS and no s/s of distress noted.

## 2022-03-12 ENCOUNTER — Telehealth: Payer: Self-pay | Admitting: *Deleted

## 2022-03-12 NOTE — Telephone Encounter (Signed)
Follow up call from fall 03/11/22.  Patient states that he is experiencing soreness 6/10 in right hand, arm up to the shoulder, low back and right buttocks.  No bruising noted. Per Dr. Delton Coombes, patient advised to keep an eye on symptoms and if there is no swelling, should improve over the next couple of days.  Advised to contact us if pain worsens or if he notices swelling.  Verbalized understanding.  Made aware of negative x ray of right hand from yesterday.

## 2022-03-13 LAB — T4: T4, Total: 7.9 ug/dL (ref 4.5–12.0)

## 2022-03-24 ENCOUNTER — Other Ambulatory Visit (HOSPITAL_COMMUNITY): Payer: Self-pay

## 2022-03-27 ENCOUNTER — Ambulatory Visit (INDEPENDENT_AMBULATORY_CARE_PROVIDER_SITE_OTHER): Payer: HMO | Admitting: Family Medicine

## 2022-03-27 DIAGNOSIS — I5042 Chronic combined systolic (congestive) and diastolic (congestive) heart failure: Secondary | ICD-10-CM | POA: Insufficient documentation

## 2022-03-27 DIAGNOSIS — C3411 Malignant neoplasm of upper lobe, right bronchus or lung: Secondary | ICD-10-CM

## 2022-03-27 DIAGNOSIS — I480 Paroxysmal atrial fibrillation: Secondary | ICD-10-CM | POA: Diagnosis not present

## 2022-03-27 DIAGNOSIS — Z9889 Other specified postprocedural states: Secondary | ICD-10-CM

## 2022-03-27 DIAGNOSIS — E782 Mixed hyperlipidemia: Secondary | ICD-10-CM

## 2022-03-27 DIAGNOSIS — Z8673 Personal history of transient ischemic attack (TIA), and cerebral infarction without residual deficits: Secondary | ICD-10-CM | POA: Diagnosis not present

## 2022-03-27 DIAGNOSIS — J449 Chronic obstructive pulmonary disease, unspecified: Secondary | ICD-10-CM

## 2022-03-27 DIAGNOSIS — Z8679 Personal history of other diseases of the circulatory system: Secondary | ICD-10-CM | POA: Insufficient documentation

## 2022-03-27 NOTE — Patient Instructions (Signed)
Continue current meds.  Follow up in 6 months.  Take care  Dr. Lacinda Axon

## 2022-03-28 NOTE — Assessment & Plan Note (Signed)
Stable.  Continue Spiriva.

## 2022-03-28 NOTE — Assessment & Plan Note (Signed)
Stable.  Continue metoprolol and Eliquis.

## 2022-03-28 NOTE — Progress Notes (Signed)
Subjective:  Patient ID: Darryl Ward, male    DOB: 03/25/1945  Age: 77 y.o. MRN: [id number]  CC: Chief Complaint  Patient presents with   Establish Care    HPI:  77 year old male with an extensive past medical history as outlined below presents to establish care.  Patient is overall doing okay at this time.  He is following closely with hematology/oncology regarding lung cancer treatment.  Currently receiving immunotherapy.  Prior abnormal areas in the colon and parotid gland on PET scan revealed no evidence of malignancy once colonoscopy was done and biopsy was done.  Patient has quit smoking.  I congratulated him on this.  Has CAD.  Has had prior MI.  Also has peripheral vascular disease.  Paroxysmal A-fib is stable.  Follows with cardiology.  Has previously had cardioversion.  Currently on Eliquis and metoprolol.  Patient is on statin.  Last available LDL was 153.  This will need to be done in the near future to reassess.  His goal LDL is at least less than 70.  He is on max dose Lipitor.  Has a history of heart failure.  Most recent echo revealed good ejection fraction.  COPD is stable at this time on Spiriva.  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   Chronic midline low back pain without sciatica 04/27/2019   PVD (peripheral vascular disease) ([place]) 05/21/2012   Mixed hyperlipidemia 12/13/2011   Coronary atherosclerosis of native coronary  artery 12/13/2011    Social Hx   Social History   Socioeconomic History   Marital status: Divorced    Spouse name: Not on file   Number of children: 1   Years of education: 11   Highest education level: 11th grade  Occupational History    Employer: Probation officer  Tobacco Use   Smoking status: Former    Packs/day: 1.00    Years: 40.00    Total pack years: 40.00    Types: Cigarettes    Quit date: 07/2021    Years since quitting: 0.6   Smokeless tobacco: Never   Tobacco comments:    1 pack of cigarettes smoked daily. 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[facility], RN   Vaping Use   Vaping Use: Never used  Substance and Sexual Activity   Alcohol use: No    Comment: Prior history of regular alcohol use   Drug use: No   Sexual activity: Not Currently  Other Topics Concern   Not on file  Social History Narrative   Not on file   Social Determinants of Health   Financial Resource Strain: Low Risk  (11/29/2019)   Overall Financial Resource Strain (CARDIA)    Difficulty of Paying Living Expenses: Not hard at all  Food Insecurity: No Food Insecurity (11/29/2021)   Hunger Vital Sign    Worried About Running Out of Food in the Last Year: Never true    Ran Out of Food in the Last Year: Never true  Transportation Needs: No Transportation Needs (11/29/2021)  PRAPARE - Hydrologist (Medical): No    Lack of Transportation (Non-Medical): No  Physical Activity: Inactive (11/29/2019)   Exercise Vital Sign    Days of Exercise per Week: 0 days    Minutes of Exercise per Session: 0 min  Stress: No Stress Concern Present (11/29/2019)   [place]    Feeling of Stress : Only a little  Social Connections: Moderately Isolated (11/29/2019)   Social Connection and Isolation Panel [NHANES]    Frequency of Communication with Friends and Family: More than three times a week    Frequency of Social Gatherings with Friends and Family:  More than three times a week    Attends Religious Services: 1 to 4 times per year    Active Member of [employer] or Organizations: No    Attends Archivist Meetings: Never    Marital Status: Divorced    Review of Systems  Constitutional: Negative.   HENT:         Hard of hearing.    Objective:  Ht '5\' 10"'$  (1.778 m)   Wt 161 lb (73 kg)   BMI 23.10 kg/m      03/27/2022    3:21 PM 03/11/2022    1:42 PM 03/11/2022   12:28 PM  BP/Weight  Systolic BP  [id number]  Diastolic BP  61 62  Wt. (Lbs) 161    BMI 23.1 kg/m2      Physical Exam Vitals and nursing note reviewed.  Constitutional:      General: He is not in acute distress.    Appearance: Normal appearance.  HENT:     Head: Normocephalic and atraumatic.  Eyes:     General:        Right eye: No discharge.        Left eye: No discharge.     Conjunctiva/sclera: Conjunctivae normal.  Cardiovascular:     Rate and Rhythm: Regular rhythm. Bradycardia present.  Pulmonary:     Effort: Pulmonary effort is normal.     Breath sounds: No wheezing.  Abdominal:     General: There is no distension.     Palpations: Abdomen is soft.     Tenderness: There is no abdominal tenderness.  Neurological:     Mental Status: He is alert.  Psychiatric:        Mood and Affect: Mood normal.        Behavior: Behavior normal.     Lab Results  Component Value Date   WBC 7.6 03/11/2022   HGB 10.5 (L) 03/11/2022   HCT 32.8 (L) 03/11/2022   PLT 136 (L) 03/11/2022   GLUCOSE 104 (H) 03/11/2022   CHOL 202 (H) 02/05/2020   TRIG 99 02/05/2020   HDL 29 (L) 02/05/2020   LDLCALC 153 (H) 02/05/2020   ALT 17 03/11/2022   AST 26 03/11/2022   NA 137 03/11/2022   K 3.9 03/11/2022   CL 105 03/11/2022   CREATININE 0.98 03/11/2022   BUN 15 03/11/2022   CO2 25 03/11/2022   TSH 0.936 03/11/2022   INR 1.3 (H) 01/23/2022   HGBA1C 5.1 02/05/2020     Assessment & Plan:   Problem List Items Addressed This Visit       Cardiovascular and  Mediastinum   Chronic combined systolic and diastolic CHF (congestive heart failure) ([place])    Most recent EF was preserved.  Continue metoprolol and lasix.  PAF (paroxysmal atrial fibrillation) ([facility])    Stable.  Continue metoprolol and Eliquis.        Respiratory   COPD (chronic obstructive pulmonary disease) ([facility])    Stable.  Continue Spiriva.      Primary adenocarcinoma of upper lobe of right lung [facility])    Currently followed closely by hematology/oncology.  Receiving immunotherapy.        Other   History of stroke   S/P AAA repair   Mixed hyperlipidemia    Will need lipid panel at follow-up.  Continue Lipitor.        Follow-up:  6 months [place]

## 2022-03-28 NOTE — Assessment & Plan Note (Signed)
Currently followed closely by hematology/oncology.  Receiving immunotherapy.

## 2022-03-28 NOTE — Assessment & Plan Note (Addendum)
Most recent EF was preserved.  Continue metoprolol and lasix.

## 2022-03-28 NOTE — Assessment & Plan Note (Signed)
Will need lipid panel at follow-up.  Continue Lipitor.

## 2022-03-31 NOTE — Patient Instructions (Signed)
[facility] at [facility] Discharge Instructions   You were seen and examined today by Dr. Katragadda.  He reviewed the results of your lab work which are normal/stable.   We will proceed with your treatment today.  Return as scheduled.    Thank you for choosing [facility] at [facility] to provide your oncology and hematology care.  To afford each patient quality time with our provider, please arrive at least 15 minutes before your scheduled appointment time.   If you have a lab appointment with the Cancer Center please come in thru the Main Entrance and check in at the main information desk.  You need to re-schedule your appointment should you arrive 10 or more minutes late.  We strive to give you quality time with our providers, and arriving late affects you and other patients whose appointments are after yours.  Also, if you no show three or more times for appointments you may be dismissed from the clinic at the providers discretion.     Again, thank you for choosing [facility].  Our hope is that these requests will decrease the amount of time that you wait before being seen by our physicians.       _____________________________________________________________  Should you have questions after your visit to [facility], please contact our office at [phone] and follow the prompts.  Our office hours are 8:00 a.m. and 4:30 p.m. Monday - Friday.  Please note that voicemails left after 4:00 p.m. may not be returned until the following business day.  We are closed weekends and major holidays.  You do have access to a nurse 24-7, just call the main number to the clinic [phone] and do not press any options, hold on the line and a nurse will answer the phone.    For prescription refill requests, have your pharmacy contact our office and allow 72 hours.    Due to Covid, you will need to wear a mask upon entering  the hospital. If you do not have a mask, a mask will be given to you at the Main Entrance upon arrival. For doctor visits, patients may have 1 support person age 18 or older with them. For treatment visits, patients can not have anyone with them due to social distancing guidelines and our immunocompromised population.      

## 2022-04-01 ENCOUNTER — Inpatient Hospital Stay: Payer: HMO | Attending: Hematology

## 2022-04-01 ENCOUNTER — Inpatient Hospital Stay (HOSPITAL_BASED_OUTPATIENT_CLINIC_OR_DEPARTMENT_OTHER): Payer: HMO | Admitting: Hematology

## 2022-04-01 ENCOUNTER — Inpatient Hospital Stay: Payer: HMO

## 2022-04-01 VITALS — BP 129/62 | HR 52 | Temp 97.5°F | Resp 16

## 2022-04-01 DIAGNOSIS — D649 Anemia, unspecified: Secondary | ICD-10-CM | POA: Insufficient documentation

## 2022-04-01 DIAGNOSIS — C3411 Malignant neoplasm of upper lobe, right bronchus or lung: Secondary | ICD-10-CM | POA: Diagnosis not present

## 2022-04-01 DIAGNOSIS — Z79899 Other long term (current) drug therapy: Secondary | ICD-10-CM | POA: Diagnosis not present

## 2022-04-01 DIAGNOSIS — I4891 Unspecified atrial fibrillation: Secondary | ICD-10-CM | POA: Diagnosis not present

## 2022-04-01 DIAGNOSIS — Z5112 Encounter for antineoplastic immunotherapy: Secondary | ICD-10-CM | POA: Diagnosis present

## 2022-04-01 DIAGNOSIS — D508 Other iron deficiency anemias: Secondary | ICD-10-CM

## 2022-04-01 LAB — COMPREHENSIVE METABOLIC PANEL
ALT: 16 U/L (ref 0–44)
AST: 21 U/L (ref 15–41)
Albumin: 3.3 g/dL — ABNORMAL LOW (ref 3.5–5.0)
Alkaline Phosphatase: 56 U/L (ref 38–126)
Anion gap: 7 (ref 5–15)
BUN: 14 mg/dL (ref 8–23)
CO2: 24 mmol/L (ref 22–32)
Calcium: 8.5 mg/dL — ABNORMAL LOW (ref 8.9–10.3)
Chloride: 103 mmol/L (ref 98–111)
Creatinine, Ser: 0.98 mg/dL (ref 0.61–1.24)
GFR, Estimated: >60 mL/min (ref >60)
Glucose, Bld: 102 mg/dL — ABNORMAL HIGH (ref 70–99)
Potassium: 3.8 mmol/L (ref 3.5–5.1)
Sodium: 134 mmol/L — ABNORMAL LOW (ref 135–145)
Total Bilirubin: 0.7 mg/dL (ref 0.3–1.2)
Total Protein: 6.6 g/dL (ref 6.5–8.1)

## 2022-04-01 LAB — VITAMIN B12: Vitamin B-12: 230 pg/mL (ref 180–914)

## 2022-04-01 LAB — CBC WITH DIFFERENTIAL/PLATELET
Abs Immature Granulocytes: 0.03 10*3/uL (ref 0.00–0.07)
Basophils Absolute: 0.1 10*3/uL (ref 0.0–0.1)
Basophils Relative: 1 %
Eosinophils Absolute: 0.2 10*3/uL (ref 0.0–0.5)
Eosinophils Relative: 3 %
HCT: 33.1 % — ABNORMAL LOW (ref 39.0–52.0)
Hemoglobin: 10.6 g/dL — ABNORMAL LOW (ref 13.0–17.0)
Immature Granulocytes: 0 %
Lymphocytes Relative: 21 %
Lymphs Abs: 1.5 10*3/uL (ref 0.7–4.0)
MCH: 30.4 pg (ref 26.0–34.0)
MCHC: 32 g/dL (ref 30.0–36.0)
MCV: 94.8 fL (ref 80.0–100.0)
Monocytes Absolute: 0.5 10*3/uL (ref 0.1–1.0)
Monocytes Relative: 6 %
Neutro Abs: 4.8 10*3/uL (ref 1.7–7.7)
Neutrophils Relative %: 69 %
Platelets: 142 10*3/uL — ABNORMAL LOW (ref 150–400)
RBC: 3.49 MIL/uL — ABNORMAL LOW (ref 4.22–5.81)
RDW: 14.1 % (ref 11.5–15.5)
WBC: 7 10*3/uL (ref 4.0–10.5)
nRBC: 0 % (ref 0.0–0.2)

## 2022-04-01 LAB — FOLATE: Folate: 36.3 ng/mL (ref >5.9)

## 2022-04-01 LAB — IRON AND TIBC
Iron: 64 ug/dL (ref 45–182)
Saturation Ratios: 22 % (ref 17.9–39.5)
TIBC: 291 ug/dL (ref 250–450)
UIBC: 227 ug/dL

## 2022-04-01 LAB — TSH: TSH: 1.511 u[IU]/mL (ref 0.350–4.500)

## 2022-04-01 LAB — FERRITIN: Ferritin: 86 ng/mL (ref 24–336)

## 2022-04-01 LAB — MAGNESIUM: Magnesium: 1.9 mg/dL (ref 1.7–2.4)

## 2022-04-01 MED ORDER — SODIUM CHLORIDE 0.9 % IV SOLN: Freq: Once | INTRAVENOUS | Status: AC

## 2022-04-01 MED ORDER — HEPARIN SOD (PORK) LOCK FLUSH 100 UNIT/ML IV SOLN
500.0000 [IU] | Freq: Once | INTRAVENOUS | Status: AC | PRN
  Administered 2022-04-01: 500 [IU]

## 2022-04-01 MED ORDER — SODIUM CHLORIDE 0.9 % IV SOLN
200.0000 mg | Freq: Once | INTRAVENOUS | Status: AC
  Administered 2022-04-01: 200 mg via INTRAVENOUS
  Filled 2022-04-01: qty 8

## 2022-04-01 MED ORDER — SODIUM CHLORIDE 0.9% FLUSH
10.0000 mL | INTRAVENOUS | Status: DC | PRN
  Administered 2022-04-01: 10 mL

## 2022-04-01 MED ORDER — SODIUM CHLORIDE 0.9% FLUSH
10.0000 mL | Freq: Once | INTRAVENOUS | Status: AC
  Administered 2022-04-01: 10 mL via INTRAVENOUS

## 2022-04-01 NOTE — Progress Notes (Signed)
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  Occasional additional small pulmonary nodules measuring 0.4 cm and smaller. - Bronchoscopy (06/11/2021): RUL nodule brushing and FNA: Malignant cells with features of adenocarcinoma. - PET scan (06/22/2021): Subsolid nodular lesion in the right upper lobe, hypermetabolic SUV 15.  Small right hilar and infrahilar lymph nodes with SUV 4.03 concerning for metastatic adenopathy.  Small hypermetabolic left parotid gland lesion, consistent with previous history of Wharton's tumor.  Hypermetabolic focus in the transverse colon SUV 8.69. - MRI of the brain (07/11/2021): No evidence of metastatic disease.  Right sphenoid wing meningioma stable since 2022. - He was evaluated by Dr. Roxan Ward and was recommended to undergo neoadjuvant chemoimmunotherapy followed by surgical resection. - 3 cycles of carboplatin, pemetrexed from 08/06/2021 through 10/02/2021 (opdivo given with only cycle 2, discontinued for cycle 3 due to cardiac problems) - NGS testing not performed due to insufficient sample. - Guardant360: Negative for EGFR and ALK.  MSI high not detected. - Right upper lobectomy and lymph node dissection (11/12/2021): 1.2 cm invasive adenocarcinoma, moderately differentiated, margins negative, pT1b pN0, 0/17 lymph  nodes positive from stations 4R, 7, 8, 9, 10R, 11 R and 12 R.  Negative for visceral pleural involvement, LVI.  PD-L1 TPS is 1%. - Adjuvant pembrolizumab started on 03/11/2022.    Social/family history: - Lives at home with his wife.  Uses cane occasionally after he had stroke in January 2022.  He has retired after working in [employer].  He is current active smoker, 1 pack/day for 60 years.  No exposure to chemicals. - Brother died of metastatic cancer.  Maternal uncle had lung cancer.  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 - Reported dry skin.  Denied any diarrhea, dry cough, shortness of breath. - He is eating better and gained 7 to 8 pounds since last visit.  He still wobbly and walks with the help of walker. - Reviewed labs today which showed normal LFTs.  CBC was grossly normal.  TSH is 1.5. - Proceed with cycle 2 today.  RTC 3 weeks for follow-up.  Plan to repeat CT scan of the chest with contrast.  2.  Normocytic anemia: - Ferritin is 86, percent saturation 22.  123456 is 123456 and folic acid is normal. - Recommend starting iron tablet Monday, Wednesday and Friday.  May use stool softener if constipation.   3.  Atrial fibrillation: - Repeat 2D echo on 03/12/2022 with EF improved to 55%.  He continues to be in normal sinus rhythm.   4.  Depression: - Continue Lexapro 10 mg daily which is helping.    Orders Placed This Encounter  Procedures   CT Chest W Contrast    Standing Status:   Future    Standing Expiration Date:   04/01/2023  Order Specific Question:   If indicated for the ordered procedure, I authorize the administration of contrast media per Radiology protocol    Answer:   Yes    Order Specific Question:   Does the patient have a contrast media/X-ray dye allergy?    Answer:   No    Order Specific Question:   Preferred imaging location?    Answer:   [facility]    Magnesium    Standing Status:   Future    Standing Expiration Date:   06/03/2023   Magnesium    Standing Status:   Future    Standing Expiration Date:   06/24/2023     I,Darryl Ward,acting as a scribe for Darryl Jack, MD.,have documented all relevant documentation on the behalf of Darryl Jack, MD,as directed by  Darryl Jack, MD while in the presence of Darryl Jack, MD.  I, Darryl Jack MD, have reviewed the above documentation for accuracy and completeness, and I agree with the above.    Darryl Jack, MD   3/11/20245:04 PM  CHIEF COMPLAINT:   Diagnosis: right upper lobe lung adenocarcinoma    Cancer Staging  Primary adenocarcinoma of upper lobe of right lung Darryl Ward) Staging form: Lung, AJCC 8th Edition - Clinical stage from 06/26/2021: cT1, cN1, cM0 - Unsigned    Prior Therapy:  1.  Neoadjuvant chemoimmunotherapy with carboplatin, pemetrexed  Current Therapy:  Adjuvant pembrolizumab    HISTORY OF PRESENT ILLNESS:   Oncology History  Primary adenocarcinoma of upper lobe of right lung (Darryl Ward)  06/26/2021 Initial Diagnosis   Primary adenocarcinoma of upper lobe of right lung (Darryl Ward)   08/06/2021 - 08/27/2021 Chemotherapy   Patient is on Treatment Plan : LUNG NSCLC Pemetrexed + Carboplatin q21d x 4 Cycles     08/06/2021 - 10/02/2021 Chemotherapy   Patient is on Treatment Plan : LUNG NSCLC Pemetrexed + Carboplatin q21d x 4 Cycles     03/11/2022 -  Chemotherapy   Patient is on Treatment Plan : LUNG NSCLC Pembrolizumab (200) q21d        INTERVAL HISTORY:   Darryl Ward is a 77 y.o. male presenting to clinic today for follow up of ight upper lobe lung adenocarcinoma. He was last seen by me on 03/11/22.  Today, he states that he is doing well overall. His appetite level is at 80%. His energy level is at 75%.  He is tolerated pembrolizumab very well.  Denied any immunotherapy related side effects.   PAST MEDICAL HISTORY:   Past Medical  History: Past Medical History:  Diagnosis Date   AAA (abdominal aortic aneurysm) ([place])    Arthritis    Cancer ([place])    Carotid artery disease ([place])    Nonobstructive   Cataract    COPD (chronic obstructive pulmonary disease) ([place])    Coronary atherosclerosis of native coronary artery    PTCA small diagonal 2007 otherwise nonobstructive CAD   Depression    Dysrhythmia    Essential hypertension, benign    Hyperlipidemia    NSTEMI (non-ST elevated myocardial infarction) ([place]) 2007   Stroke [facility]) 2004   TIA (transient ischemic attack) 2006    Surgical History: Past Surgical History:  Procedure Laterality Date   AORTA - BILATERAL FEMORAL ARTERY BYPASS GRAFT  01/08/2012   Procedure: AORTA BIFEMORAL BYPASS GRAFT;  Surgeon: Elam Dutch, MD;  Location: [facility] OR;  Service: Vascular;  Laterality: Bilateral;  using 18x14m x 40cm Hemashield Gold Vascular Graft with Endarterectomy, Thombectomy and  Reimplantation of Inferior Mesenteric Artery  BACK SURGERY  2021   BRONCHIAL BIOPSY  06/11/2021   Procedure: BRONCHIAL BIOPSIES;  Surgeon: [name], MD;  Location: [facility] ENDOSCOPY;  Service: Pulmonary;;   BRONCHIAL BRUSHINGS  06/11/2021   Procedure: BRONCHIAL BRUSHINGS;  Surgeon: [name], MD;  Location: [facility] ENDOSCOPY;  Service: Pulmonary;;   BRONCHIAL NEEDLE ASPIRATION BIOPSY  06/11/2021   Procedure: BRONCHIAL NEEDLE ASPIRATION BIOPSIES;  Surgeon: [name], MD;  Location: [facility] ENDOSCOPY;  Service: Pulmonary;;   CARDIOVERSION N/A 09/20/2021   Procedure: CARDIOVERSION;  Surgeon: Darryl Sark, MD;  Location: [facility] ORS;  Service: Cardiovascular;  Laterality: N/A;   COLONOSCOPY N/A 04/14/2019   Procedure: COLONOSCOPY;  Surgeon: Daneil Dolin, MD;  [facility]: [facility] ENDO SUITE;  Service: Endoscopy;  Laterality: N/A;  9:30   COLONOSCOPY WITH PROPOFOL N/A 07/25/2021   Procedure: COLONOSCOPY WITH PROPOFOL;  Surgeon: Eloise Harman, DO;  [facility];  Service: Endoscopy;   Laterality: N/A;  1:00pm   FIDUCIAL MARKER PLACEMENT  06/11/2021   Procedure: FIDUCIAL MARKER PLACEMENT;  Surgeon: [name], MD;  Location: [facility] ENDOSCOPY;  Service: Pulmonary;;   INTERCOSTAL NERVE BLOCK Right 11/12/2021   Procedure: INTERCOSTAL NERVE BLOCK;  Surgeon: [name], MD;  Location: [place];  Service: Thoracic;  Laterality: Right;   IR IMAGING GUIDED PORT INSERTION  07/31/2021   Left cataract surgery     LYMPH NODE DISSECTION Right 11/12/2021   Procedure: LYMPH NODE DISSECTION;  Surgeon: [name], MD;  Location: [place];  Service: Thoracic;  Laterality: Right;   POLYPECTOMY  04/14/2019   Procedure: POLYPECTOMY;  Surgeon: Daneil Dolin, MD;  [facility];  Service: Endoscopy;;   POLYPECTOMY  07/25/2021   Procedure: POLYPECTOMY;  Surgeon: Eloise Harman, DO;  [facility];  Service: Endoscopy;;   TEE WITHOUT CARDIOVERSION N/A 09/20/2021   Procedure: TRANSESOPHAGEAL ECHOCARDIOGRAM (TEE);  Surgeon: Darryl Sark, MD;  Location: [facility];  Service: Cardiovascular;  Laterality: N/A;   TRANSFORAMINAL LUMBAR INTERBODY FUSION (TLIF) WITH PEDICLE SCREW FIXATION 1 LEVEL N/A 04/27/2020   Procedure: Transforaminal Lumbar Interbody Fusion Lumbar Five-Sacral One;  Surgeon: Vallarie Mare, MD;  Location: [place];  Service: Neurosurgery;  Laterality: N/A;   VIDEO BRONCHOSCOPY WITH INSERTION OF INTERBRONCHIAL VALVE (IBV) N/A 11/22/2021   Procedure: VIDEO BRONCHOSCOPY WITH INSERTION OF INTERBRONCHIAL VALVE (IBV);  Surgeon: [name], MD;  Location: [facility] OR;  Service: Thoracic;  Laterality: N/A;   VIDEO BRONCHOSCOPY WITH INSERTION OF INTERBRONCHIAL VALVE (IBV) N/A 01/24/2022   Procedure: VIDEO BRONCHOSCOPY WITH REMOVAL OF INTERBRONCHIAL VALVE (IBV);  Surgeon: [name], MD;  Location: [facility] OR;  Service: Thoracic;  Laterality: N/A;   VIDEO BRONCHOSCOPY WITH RADIAL ENDOBRONCHIAL ULTRASOUND  06/11/2021   Procedure: VIDEO  BRONCHOSCOPY WITH RADIAL ENDOBRONCHIAL ULTRASOUND;  Surgeon: [name], MD;  Location: [facility] ENDOSCOPY;  Service: Pulmonary;;    Social History: Social History   Socioeconomic History   Marital status: Divorced    Spouse name: Not on file   Number of children: 1   Years of education: 11   Highest education level: 11th grade  Occupational History    Employer: Probation officer  Tobacco Use   Smoking status: Former    Packs/day: 1.00    Years: 40.00    Total pack years: 40.00    Types: Cigarettes    Quit date: 07/2021    Years since quitting: 0.6   Smokeless tobacco: Never   Tobacco comments:  1 pack of cigarettes smoked daily. 07/17/21 ARJ, RN   Vaping Use   Vaping Use: Never used  Substance and Sexual Activity   Alcohol use: No    Comment: Prior history of regular alcohol use   Drug use: No   Sexual activity: Not Currently  Other Topics Concern   Not on file  Social History Narrative   Not on file   Social Determinants of Health   Financial Resource Strain: Low Risk  (11/29/2019)   Overall Financial Resource Strain (CARDIA)    Difficulty of Paying Living Expenses: Not hard at all  Food Insecurity: No Food Insecurity (11/29/2021)   Hunger Vital Sign    Worried About Running Out of Food in the Last Year: Never true    Ran Out of Food in the Last Year: Never true  Transportation Needs: No Transportation Needs (11/29/2021)   PRAPARE - Hydrologist (Medical): No    Lack of Transportation (Non-Medical): No  Physical Activity: Inactive (11/29/2019)   Exercise Vital Sign    Days of Exercise per Week: 0 days    Minutes of Exercise per Session: 0 min  Stress: No Stress Concern Present (11/29/2019)   [place]    Feeling of Stress : Only a little  Social Connections: Moderately Isolated (11/29/2019)   Social Connection and Isolation Panel [NHANES]    Frequency of Communication  with Friends and Family: More than three times a week    Frequency of Social Gatherings with Friends and Family: More than three times a week    Attends Religious Services: 1 to 4 times per year    Active Member of [employer] or Organizations: No    Attends Archivist Meetings: Never    Marital Status: Divorced  Human resources officer Violence: Not At Risk (11/13/2021)   Humiliation, Afraid, Rape, and Kick questionnaire    Fear of Current or Ex-Partner: No    Emotionally Abused: No    Physically Abused: No    Sexually Abused: No    Family History: Family History  Problem Relation Age of Onset   Hyperlipidemia Sister    Heart attack Brother 90   Cancer - Colon Neg Hx     Current Medications:  Current Outpatient Medications:    acetaminophen (TYLENOL) 500 MG tablet, Take 2 tablets (1,000 mg total) by mouth every 6 (six) hours as needed. (Patient taking differently: Take 1,000 mg by mouth 2 (two) times daily as needed for moderate pain.), Disp: 30 tablet, Rfl: 0   albuterol (VENTOLIN HFA) 108 (90 Base) MCG/ACT inhaler, Inhale 2 puffs into the lungs every 6 (six) hours as needed for wheezing or shortness of breath., Disp: 6.7 g, Rfl: 6   apixaban (ELIQUIS) 5 MG TABS tablet, Take 1 tablet (5 mg total) by mouth 2 (two) times daily., Disp: 180 tablet, Rfl: 1   atorvastatin (LIPITOR) 80 MG tablet, Take 80 mg by mouth daily., Disp: , Rfl:    cyclobenzaprine (FLEXERIL) 10 MG tablet, Take 1 tablet (10 mg total) by mouth 2 (two) times daily as needed., Disp: 180 tablet, Rfl: 3   escitalopram (LEXAPRO) 10 MG tablet, Take 1 tablet (10 mg total) by mouth daily., Disp: 90 tablet, Rfl: 3   folic acid (FOLVITE) 1 MG tablet, Take 1 tablet (1 mg total) by mouth daily., Disp: 90 tablet, Rfl: 2   furosemide (LASIX) 20 MG tablet, Take 1 tablet (20 mg total) by mouth  daily as needed. (Patient taking differently: Take 20 mg by mouth in the morning.), Disp: 90 tablet, Rfl: 2   lidocaine-prilocaine (EMLA)  cream, Apply a small amount to port a cath site and cover with plastic wrap 1 hour prior to infusion appointments, Disp: 30 g, Rfl: 3   Magnesium Oxide 400 MG CAPS, Take 1 capsule (400 mg total) by mouth daily., Disp: , Rfl: 0   metoprolol succinate (TOPROL-XL) 50 MG 24 hr tablet, Take 1.5 tablets (75 mg total) by mouth daily. Take with or immediately following a meal., Disp: 135 tablet, Rfl: 3   prochlorperazine (COMPAZINE) 10 MG tablet, Take 1 tablet (10 mg total) by mouth every 6 (six) hours as needed for nausea/vomiting, Disp: 60 tablet, Rfl: 3   Tiotropium Bromide Monohydrate (SPIRIVA RESPIMAT) 2.5 MCG/ACT AERS, Inhale 2 puffs into the lungs daily., Disp: 4 g, Rfl: 0   traMADol (ULTRAM) 50 MG tablet, Take 1 tablet (50 mg total) by mouth every 6 (six) hours as needed (mild pain)., Disp: 30 tablet, Rfl: 0 No current facility-administered medications for this visit.  Facility-Administered Medications Ordered in Other Visits:    sodium chloride flush (NS) 0.9 % injection 10 mL, 10 mL, Intracatheter, PRN, Darryl Jack, MD, 10 mL at 04/01/22 1230   Allergies: No Known Allergies  REVIEW OF SYSTEMS:   Review of Systems  Constitutional:  Negative for chills, fatigue and fever.  HENT:   Negative for lump/mass, mouth sores, nosebleeds, sore throat and trouble swallowing.   Eyes:  Negative for eye problems.  Respiratory:  Negative for cough and shortness of breath.   Cardiovascular:  Negative for chest pain, leg swelling and palpitations.  Gastrointestinal:  Negative for abdominal pain, constipation, diarrhea, nausea and vomiting.  Genitourinary:  Negative for bladder incontinence, difficulty urinating, dysuria, frequency, hematuria and nocturia.   Musculoskeletal:  Negative for arthralgias, back pain, flank pain, myalgias and neck pain.  Skin:  Negative for itching and rash.  Neurological:  Positive for headaches and numbness. Negative for dizziness.  Hematological:  Does not  bruise/bleed easily.  Psychiatric/Behavioral:  Negative for depression, sleep disturbance and suicidal ideas. The patient is not nervous/anxious.   All other systems reviewed and are negative.    VITALS:   There were no vitals taken for this visit.  Wt Readings from Last 3 Encounters:  04/01/22 169 lb 6.4 oz (76.8 kg)  03/27/22 161 lb (73 kg)  03/11/22 165 lb 6.4 oz (75 kg)    There is no height or weight on file to calculate BMI.  Performance status (ECOG): 1 - Symptomatic but completely ambulatory  PHYSICAL EXAM:   Physical Exam Vitals and nursing note reviewed. Exam conducted with a chaperone present.  Constitutional:      Appearance: Normal appearance.  Cardiovascular:     Rate and Rhythm: Normal rate and regular rhythm.     Pulses: Normal pulses.     Heart sounds: Normal heart sounds.  Pulmonary:     Effort: Pulmonary effort is normal.     Breath sounds: Normal breath sounds.  Abdominal:     Palpations: Abdomen is soft. There is no hepatomegaly, splenomegaly or mass.     Tenderness: There is no abdominal tenderness.  Musculoskeletal:     Right lower leg: No edema.     Left lower leg: No edema.  Lymphadenopathy:     Cervical: No cervical adenopathy.     Right cervical: No superficial, deep or posterior cervical adenopathy.    Left cervical: No  superficial, deep or posterior cervical adenopathy.     Upper Body:     Right upper body: No supraclavicular or axillary adenopathy.     Left upper body: No supraclavicular or axillary adenopathy.  Neurological:     General: No focal deficit present.     Mental Status: He is alert and oriented to person, place, and time.  Psychiatric:        Mood and Affect: Mood normal.        Behavior: Behavior normal.     LABS:      Latest Ref Rng & Units 04/01/2022    9:11 AM 03/11/2022   10:22 AM 01/23/2022   10:50 AM  CBC  WBC 4.0 - 10.5 K/uL 7.0  7.6  7.2   Hemoglobin 13.0 - 17.0 g/dL 10.6  10.5  10.3   Hematocrit 39.0 - 52.0  % 33.1  32.8  32.1   Platelets 150 - 400 K/uL 142  136  140       Latest Ref Rng & Units 04/01/2022    9:11 AM 03/11/2022   10:22 AM 01/23/2022   10:50 AM  CMP  Glucose 70 - 99 mg/dL 102  104  101   BUN 8 - 23 mg/dL '14  15  11   '$ Creatinine 0.61 - 1.24 mg/dL 0.98  0.98  1.04   Sodium 135 - 145 mmol/L 134  137  137   Potassium 3.5 - 5.1 mmol/L 3.8  3.9  4.7   Chloride 98 - 111 mmol/L 103  105  106   CO2 22 - 32 mmol/L '24  25  23   '$ Calcium 8.9 - 10.3 mg/dL 8.5  8.7  8.8   Total Protein 6.5 - 8.1 g/dL 6.6  6.6  6.2   Total Bilirubin 0.3 - 1.2 mg/dL 0.7  1.0  0.4   Alkaline Phos 38 - 126 U/L 56  50  52   AST 15 - 41 U/L '21  26  26   '$ ALT 0 - 44 U/L '16  17  18      '$ No results found for: "CEA1", "CEA" / No results found for: "CEA1", "CEA" Lab Results  Component Value Date   PSA1 1.2 11/25/2019   No results found for: "[id number]" No results found for: "CAN125"  No results found for: "TOTALPROTELP", "ALBUMINELP", "A1GS", "A2GS", "BETS", "BETA2SER", "GAMS", "MSPIKE", "SPEI" Lab Results  Component Value Date   TIBC 291 04/01/2022   TIBC 297 03/11/2022   TIBC 242 (L) 07/13/2020   FERRITIN 86 04/01/2022   FERRITIN 117 03/11/2022   FERRITIN 248 07/13/2020   IRONPCTSAT 22 04/01/2022   IRONPCTSAT 24 03/11/2022   IRONPCTSAT 20 07/13/2020   No results found for: "LDH"   STUDIES:   DG Hand Complete Right  Result Date: 03/12/2022 CLINICAL DATA:  Injury right little finger EXAM: RIGHT HAND - COMPLETE 3+ VIEW COMPARISON:  None Available. FINDINGS: Radiopaque foreign body within the soft tissues overlying the right index finger proximal phalanx, of unknown chronicity. Joint space narrowing and spurring in the 3rd MCP joint. No acute bony abnormality. Specifically, no fracture, subluxation, or dislocation. IMPRESSION: No acute bony abnormality. Electronically Signed   By: Rolm Baptise M.D.   On: 03/12/2022 11:03   ECHOCARDIOGRAM COMPLETE  Result Date: 03/04/2022    ECHOCARDIOGRAM REPORT    Patient Name:   LANSANA GUZMAN Date of Exam: 03/04/2022 Medical Rec #:  [id number]       Height:  70.0 in Accession #:    [id number]      Weight:       161.2 lb Date of Birth:  02-06-1945        BSA:          1.904 m Patient Age:    22 years        BP:           116/69 mmHg Patient Gender: M               HR:           54 bpm. Exam Location:  [facility] Procedure: 2D Echo, Cardiac Doppler and Color Doppler Indications:    C34.11 (ICD-10-CM) - Primary adenocarcinoma of upper lobe of                 right lung, Chemo Z09  History:        Patient has prior history of Echocardiogram examinations, most                 recent 09/20/2021. Previous Myocardial Infarction and CAD, COPD                 and Stroke, Arrythmias:Atrial Fibrillation; Risk                 Factors:Hypertension and Dyslipidemia. Primary adenocarcinoma of                 upper lobe of right lung ([place]), Hx of Tobacco abuse.  Sonographer:    Alvino Chapel RCS Referring Phys: [id number] [place]  1. Left ventricular ejection fraction, by estimation, is approximately 55%. The left ventricle has normal function. The left ventricle demonstrates regional wall motion abnormalities (see scoring diagram/findings for description). Left ventricular diastolic parameters are consistent with Grade II diastolic dysfunction (pseudonormalization).  2. Right ventricular systolic function is normal. The right ventricular size is normal. There is normal pulmonary artery systolic pressure. The estimated right ventricular systolic pressure is [id number] mmHg.  3. Left atrial size was mildly dilated.  4. The mitral valve is grossly normal. Mild mitral valve regurgitation.  5. The aortic valve is tricuspid. Aortic valve regurgitation is not visualized.  6. Aortic dilatation noted. There is borderline dilatation of the aortic root, measuring 40 mm.  7. The inferior vena cava is dilated in size with >50% respiratory variability, suggesting right atrial pressure of  8 mmHg. Comparison(s): Prior images reviewed side by side. LVEF has improved in comparison. FINDINGS  Left Ventricle: Left ventricular ejection fraction, by estimation, is 55%. The left ventricle has normal function. The left ventricle demonstrates regional wall motion abnormalities. Global longitudinal strain performed but not reported based on interpreter judgement due to suboptimal tracking. The left ventricular internal cavity size was normal in size. There is no left ventricular hypertrophy. Left ventricular diastolic parameters are consistent with Grade II diastolic dysfunction (pseudonormalization).  LV Wall Scoring: The apical septal segment and apex are hypokinetic. The entire anterior wall, entire lateral wall, anterior septum, entire inferior wall, mid inferoseptal segment, and basal inferoseptal segment are normal. Right Ventricle: The right ventricular size is normal. No increase in right ventricular wall thickness. Right ventricular systolic function is normal. There is normal pulmonary artery systolic pressure. The tricuspid regurgitant velocity is 2.60 m/s, and  with an assumed right atrial pressure of 8 mmHg, the estimated right ventricular systolic pressure is [id number] mmHg. Left Atrium: Left atrial size was mildly dilated. Right Atrium: Right atrial size was normal  in size. Pericardium: There is no evidence of pericardial effusion. Presence of epicardial fat layer. Mitral Valve: The mitral valve is grossly normal. Mild mitral valve regurgitation. Tricuspid Valve: The tricuspid valve is grossly normal. Tricuspid valve regurgitation is mild. Aortic Valve: The aortic valve is tricuspid. There is mild aortic valve annular calcification. Aortic valve regurgitation is not visualized. Pulmonic Valve: The pulmonic valve was grossly normal. Pulmonic valve regurgitation is trivial. Aorta: Aortic dilatation noted. There is borderline dilatation of the aortic root, measuring 40 mm. Venous: The inferior vena cava  is dilated in size with greater than 50% respiratory variability, suggesting right atrial pressure of 8 mmHg. IAS/Shunts: No atrial level shunt detected by color flow Doppler.  LEFT VENTRICLE PLAX 2D LVIDd:         5.00 cm     Diastology LVIDs:         3.10 cm     LV e' medial:    6.85 cm/s LV PW:         0.90 cm     LV E/e' medial:  14.3 LV IVS:        0.80 cm     LV e' lateral:   10.30 cm/s LVOT diam:     2.20 cm     LV E/e' lateral: 9.5 LV SV:         84 LV SV Index:   44 LVOT Area:     3.80 cm  LV Volumes (MOD) LV vol d, MOD A2C: 84.4 ml LV vol d, MOD A4C: 89.1 ml LV vol s, MOD A2C: 42.0 ml LV vol s, MOD A4C: 39.9 ml LV SV MOD A2C:     42.4 ml LV SV MOD A4C:     89.1 ml LV SV MOD BP:      47.7 ml RIGHT VENTRICLE RV S prime:     13.20 cm/s TAPSE (M-mode): 2.6 cm LEFT ATRIUM             Index        RIGHT ATRIUM           Index LA diam:        4.00 cm 2.10 cm/m   RA Area:     15.70 cm LA Vol (A2C):   75.8 ml 39.80 ml/m  RA Volume:   42.50 ml  22.32 ml/m LA Vol (A4C):   65.1 ml 34.19 ml/m LA Biplane Vol: 76.6 ml 40.23 ml/m  AORTIC VALVE LVOT Vmax:   88.80 cm/s LVOT Vmean:  53.200 cm/s LVOT VTI:    0.220 m  AORTA 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

## 2022-04-01 NOTE — Progress Notes (Signed)
Patient has been examined by Dr. Katragadda. Vital signs and labs have been reviewed by MD - ANC, Creatinine, LFTs, hemoglobin, and platelets are within treatment parameters per M.D. - pt may proceed with treatment.  Primary RN and pharmacy notified.  

## 2022-04-01 NOTE — Progress Notes (Signed)
Pt presents today for Keytruda per provider's order. Vital signs and labs WNL for treatment today.   Keytruda given today per MD orders. Tolerated infusion without adverse affects. Vital signs stable. No complaints at this time. Discharged from clinic via wheelchair in stable condition. Alert and oriented x 3. F/U with [facility] as scheduled.

## 2022-04-01 NOTE — Patient Instructions (Signed)
[facility] [place]  Discharge Instructions: Thank you for choosing [facility] to provide your oncology and hematology care.  If you have a lab appointment with the Cancer Center, please come in thru the Main Entrance and check in at the main information desk.  Wear comfortable clothing and clothing appropriate for easy access to any Portacath or PICC line.   We strive to give you quality time with your provider. You may need to reschedule your appointment if you arrive late (15 or more minutes).  Arriving late affects you and other patients whose appointments are after yours.  Also, if you miss three or more appointments without notifying the office, you may be dismissed from the clinic at the provider's discretion.      For prescription refill requests, have your pharmacy contact our office and allow 72 hours for refills to be completed.    Today you received the following chemotherapy and/or immunotherapy agents Keytruda   To help prevent nausea and vomiting after your treatment, we encourage you to take your nausea medication as directed.  Pembrolizumab Injection What is this medication? PEMBROLIZUMAB (PEM broe LIZ ue mab) treats some types of cancer. It works by helping your immune system slow or stop the spread of cancer cells. It is a monoclonal antibody. This medicine may be used for other purposes; ask your health care provider or pharmacist if you have questions. COMMON BRAND NAME(S): Keytruda What should I tell my care team before I take this medication? They need to know if you have any of these conditions: Allogeneic stem cell transplant (uses someone else's stem cells) Autoimmune diseases, such as Crohn disease, ulcerative colitis, lupus History of chest radiation Nervous system problems, such as Guillain-Barre syndrome, myasthenia gravis Organ transplant An unusual or allergic reaction to pembrolizumab, other medications, foods, dyes, or  preservatives Pregnant or trying to get pregnant Breast-feeding How should I use this medication? This medication is injected into a vein. It is given by your care team in a hospital or clinic setting. A special MedGuide will be given to you before each treatment. Be sure to read this information carefully each time. Talk to your care team about the use of this medication in children. While it may be prescribed for children as young as 6 months for selected conditions, precautions do apply. Overdosage: If you think you have taken too much of this medicine contact a poison control center or emergency room at once. NOTE: This medicine is only for you. Do not share this medicine with others. What if I miss a dose? Keep appointments for follow-up doses. It is important not to miss your dose. Call your care team if you are unable to keep an appointment. What may interact with this medication? Interactions have not been studied. This list may not describe all possible interactions. Give your health care provider a list of all the medicines, herbs, non-prescription drugs, or dietary supplements you use. Also tell them if you smoke, drink alcohol, or use illegal drugs. Some items may interact with your medicine. What should I watch for while using this medication? Your condition will be monitored carefully while you are receiving this medication. You may need blood work while taking this medication. This medication may cause serious skin reactions. They can happen weeks to months after starting the medication. Contact your care team right away if you notice fevers or flu-like symptoms with a rash. The rash may be red or purple and then turn into   blisters or peeling of the skin. You may also notice a red rash with swelling of the face, lips, or lymph nodes in your neck or under your arms. Tell your care team right away if you have any change in your eyesight. Talk to your care team if you may be pregnant.  Serious birth defects can occur if you take this medication during pregnancy and for 4 months after the last dose. You will need a negative pregnancy test before starting this medication. Contraception is recommended while taking this medication and for 4 months after the last dose. Your care team can help you find the option that works for you. Do not breastfeed while taking this medication and for 4 months after the last dose. What side effects may I notice from receiving this medication? Side effects that you should report to your care team as soon as possible: Allergic reactions--skin rash, itching, hives, swelling of the face, lips, tongue, or throat Dry cough, shortness of breath or trouble breathing Eye pain, redness, irritation, or discharge with blurry or decreased vision Heart muscle inflammation--unusual weakness or fatigue, shortness of breath, chest pain, fast or irregular heartbeat, dizziness, swelling of the ankles, feet, or hands Hormone gland problems--headache, sensitivity to light, unusual weakness or fatigue, dizziness, fast or irregular heartbeat, increased sensitivity to cold or heat, excessive sweating, constipation, hair loss, increased thirst or amount of urine, tremors or shaking, irritability Infusion reactions--chest pain, shortness of breath or trouble breathing, feeling faint or lightheaded Kidney injury (glomerulonephritis)--decrease in the amount of urine, red or dark brown urine, foamy or bubbly urine, swelling of the ankles, hands, or feet Liver injury--right upper belly pain, loss of appetite, nausea, light-colored stool, dark yellow or brown urine, yellowing skin or eyes, unusual weakness or fatigue Pain, tingling, or numbness in the hands or feet, muscle weakness, change in vision, confusion or trouble speaking, loss of balance or coordination, trouble walking, seizures Rash, fever, and swollen lymph nodes Redness, blistering, peeling, or loosening of the skin,  including inside the mouth Sudden or severe stomach pain, bloody diarrhea, fever, nausea, vomiting Side effects that usually do not require medical attention (report to your care team if they continue or are bothersome): Bone, joint, or muscle pain Diarrhea Fatigue Loss of appetite Nausea Skin rash This list may not describe all possible side effects. Call your doctor for medical advice about side effects. You may report side effects to FDA at 1-800-FDA-1088. Where should I keep my medication? This medication is given in a hospital or clinic. It will not be stored at home. NOTE: This sheet is a summary. It may not cover all possible information. If you have questions about this medicine, talk to your doctor, pharmacist, or health care provider.  2023 Elsevier/Gold Standard (2021-05-22 00:00:00)   BELOW ARE SYMPTOMS THAT SHOULD BE REPORTED IMMEDIATELY: *FEVER GREATER THAN 100.4 F (38 C) OR HIGHER *CHILLS OR SWEATING *NAUSEA AND VOMITING THAT IS NOT CONTROLLED WITH YOUR NAUSEA MEDICATION *UNUSUAL SHORTNESS OF BREATH *UNUSUAL BRUISING OR BLEEDING *URINARY PROBLEMS (pain or burning when urinating, or frequent urination) *BOWEL PROBLEMS (unusual diarrhea, constipation, pain near the anus) TENDERNESS IN MOUTH AND THROAT WITH OR WITHOUT PRESENCE OF ULCERS (sore throat, sores in mouth, or a toothache) UNUSUAL RASH, SWELLING OR PAIN  UNUSUAL VAGINAL DISCHARGE OR ITCHING   Items with * indicate a potential emergency and should be followed up as soon as possible or go to the Emergency Department if any problems should occur.  Please show the CHEMOTHERAPY   ALERT CARD or IMMUNOTHERAPY ALERT CARD at check-in to the Emergency Department and triage nurse.  Should you have questions after your visit or need to cancel or reschedule your appointment, please contact [facility] [place] [phone]  and follow the prompts.  Office hours are 8:00 a.m. to 4:30 p.m. Monday - Friday. Please  note that voicemails left after 4:00 p.m. may not be returned until the following business day.  We are closed weekends and major holidays. You have access to a nurse at all times for urgent questions. Please call the main number to the clinic [phone] and follow the prompts.  For any non-urgent questions, you may also contact your provider using MyChart. We now offer e-Visits for anyone 18 and older to request care online for non-urgent symptoms. For details visit [email or web address].   Also download the MyChart app! Go to the app store, search "MyChart", open the app, select [facility], and log in with your MyChart username and password.   

## 2022-04-17 ENCOUNTER — Ambulatory Visit (HOSPITAL_COMMUNITY): Payer: HMO

## 2022-04-19 ENCOUNTER — Ambulatory Visit (HOSPITAL_COMMUNITY)
Admission: RE | Admit: 2022-04-19 | Discharge: 2022-04-19 | Disposition: A | Discharge status: Home / Self Care | Payer: HMO | Source: Ambulatory Visit | Attending: Hematology | Admitting: Hematology

## 2022-04-19 DIAGNOSIS — C3411 Malignant neoplasm of upper lobe, right bronchus or lung: Secondary | ICD-10-CM | POA: Diagnosis present

## 2022-04-19 MED ORDER — SODIUM CHLORIDE (PF) 0.9 % IJ SOLN
INTRAMUSCULAR | Status: AC
  Filled 2022-04-19: qty 50

## 2022-04-19 MED ORDER — IOHEXOL 300 MG/ML  SOLN
75.0000 mL | Freq: Once | INTRAMUSCULAR | Status: AC | PRN
  Administered 2022-04-19: 75 mL via INTRAVENOUS

## 2022-04-21 NOTE — Progress Notes (Signed)
[facility] [address] [place] [address]    Clinic Day:  04/22/2022  Referring physician: Rebekah Chesterfield, NP  Patient Care Team: [name], DO as PCP - General (Family Medicine) [name], MD as PCP - Cardiology (Cardiology) [name], MD (Cardiology) [name], MD as Consulting Physician (Gastroenterology) [name], RN as Oncology Nurse Navigator (Medical Oncology) [name], MD as Medical Oncologist (Medical Oncology)   ASSESSMENT & PLAN:   Assessment: 1. 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  Occasional additional small pulmonary nodules measuring 0.4 cm and smaller. - Bronchoscopy (06/11/2021): RUL nodule brushing and FNA: Malignant cells with features of adenocarcinoma. - PET scan (06/22/2021): Subsolid nodular lesion in the right upper lobe, hypermetabolic SUV 15.  Small right hilar and infrahilar lymph nodes with SUV 4.03 concerning for metastatic adenopathy.  Small hypermetabolic left parotid gland lesion, consistent with previous history of Wharton's tumor.  Hypermetabolic focus in the transverse colon SUV 8.69. - MRI of the brain (07/11/2021): No evidence of metastatic disease.  Right sphenoid wing meningioma stable since 2022. - He was evaluated by Dr. Dorris Fetch and was recommended to undergo neoadjuvant chemoimmunotherapy followed by surgical resection. - 3 cycles of carboplatin, pemetrexed from 08/06/2021 through 10/02/2021 (opdivo given with only cycle 2, discontinued for cycle 3 due to cardiac problems) - NGS testing not performed due to insufficient sample. - Guardant360: Negative for EGFR and ALK.  MSI high not detected. - Right upper lobectomy and lymph node dissection (11/12/2021): 1.2 cm invasive adenocarcinoma, moderately differentiated, margins negative, pT1b pN0, 0/17  lymph nodes positive from stations 4R, 7, 8, 9, 10R, 11 R and 12 R.  Negative for visceral pleural involvement, LVI.  PD-L1 TPS is 1%. - Adjuvant pembrolizumab started on 03/11/2022.   2. Social/family history: - Lives at home with his wife.  Uses cane occasionally after he had stroke in January 2022.  He has retired after working in [employer].  He is current active smoker, 1 pack/day for 60 years.  No exposure to chemicals. - Brother died of metastatic cancer.  Maternal uncle had lung cancer.  Plan: 1. 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 - CT chest (04/19/2022): Postsurgical findings.  Small pulmonary nodules in the lungs bilaterally new compared to the prior study, 9 x 6 mm pleural-based right lower lobe and 5 mm left lower lobe. - Labs today shows normal LFTs.  Creatinine elevated at 1.27.  CBC was grossly normal with normocytic anemia.  He lost 3 pounds since last visit. - Proceed with cycle 3 today.  He will receive 500 mL normal saline. - RTC 3 weeks for follow-up.  2.  Normocytic anemia: - Ferritin is 86 and percent saturation 22.  Hemoglobin 10.8. - Recommend Feraheme x 2.  Discussed side effects in detail.   3.  Atrial fibrillation: - Repeat 2D echo on 03/12/2022 with EF improved to 55%.  He continues to be in NSR.   4.  Depression: - Continue Lexapro 10 mg daily.  Orders Placed This Encounter  Procedures   Magnesium    Standing Status:   Future    Standing Expiration Date:   07/15/2023   CBC with Differential    Standing Status:   Future    Standing Expiration Date:  07/15/2023   Comprehensive metabolic panel    Standing Status:   Future    Standing Expiration Date:   07/15/2023   Magnesium    Standing Status:   Future    Standing Expiration Date:   08/05/2023   CBC with Differential    Standing Status:   Future    Standing Expiration Date:   08/05/2023   Comprehensive metabolic panel    Standing Status:    Future    Standing Expiration Date:   08/05/2023      I,[name],acting as a scribe for [name], MD.,have documented all relevant documentation on the behalf of [name], MD,as directed by  [name], MD while in the presence of [name], MD.   I, [name] MD, have reviewed the above documentation for accuracy and completeness, and I agree with the above.   [name], MD   4/1/20246:31 PM  CHIEF COMPLAINT:   Diagnosis: right upper lobe lung adenocarcinoma    Cancer Staging  Primary adenocarcinoma of upper lobe of right lung Staging form: Lung, AJCC 8th Edition - Clinical stage from 06/26/2021: cT1, cN1, cM0 - Unsigned    Prior Therapy: Neoadjuvant chemoimmunotherapy with carboplatin, pemetrexed   Current Therapy:  Adjuvant pembrolizumab    HISTORY OF PRESENT ILLNESS:   Oncology History  Primary adenocarcinoma of upper lobe of right lung  06/26/2021 Initial Diagnosis   Primary adenocarcinoma of upper lobe of right lung (HCC)   08/06/2021 - 08/27/2021 Chemotherapy   Patient is on Treatment Plan : LUNG NSCLC Pemetrexed + Carboplatin q21d x 4 Cycles     08/06/2021 - 10/02/2021 Chemotherapy   Patient is on Treatment Plan : LUNG NSCLC Pemetrexed + Carboplatin q21d x 4 Cycles     03/11/2022 -  Chemotherapy   Patient is on Treatment Plan : LUNG NSCLC Pembrolizumab (200) q21d        INTERVAL HISTORY:   Darryl Ward is a 77 y.o. male presenting to clinic today for follow up of right upper lobe lung adenocarcinoma. He was last seen by me on 04/01/22.  Since his last visit, he underwent restaging chest CT on 04/19/22 showing: new gas and fluid collection in apex of right hemithorax, potentially chronic; new, nonspecific small pulmonary nodules in lung bilaterally. Attention on follow up studies is recommended.  Today, he states that he is doing well overall. His appetite level is at 75%. His energy level is at  50%.  PAST MEDICAL HISTORY:   Past Medical History: Past Medical History:  Diagnosis Date   AAA (abdominal aortic aneurysm) (HCC)    Arthritis    Cancer (HCC)    Carotid artery disease (HCC)    Nonobstructive   Cataract    COPD (chronic obstructive pulmonary disease) (HCC)    Coronary atherosclerosis of native coronary artery    PTCA small diagonal 2007 otherwise nonobstructive CAD   Depression    Dysrhythmia    Essential hypertension, benign    Hyperlipidemia    NSTEMI (non-ST elevated myocardial infarction) (HCC) 2007   Stroke [facility]) 2004   TIA (transient ischemic attack) 2006    Surgical History: Past Surgical History:  Procedure Laterality Date   AORTA - BILATERAL FEMORAL ARTERY BYPASS GRAFT  01/08/2012   Procedure: AORTA BIFEMORAL BYPASS GRAFT;  Surgeon: Sherren Kerns, MD;  Location: [facility] OR;  Service: Vascular;  Laterality: Bilateral;  using 18x20mm x 40cm Hemashield Gold Vascular Graft with Endarterectomy, Thombectomy and  Reimplantation of Inferior Mesenteric Artery   BACK  SURGERY  2021   BRONCHIAL BIOPSY  06/11/2021   Procedure: BRONCHIAL BIOPSIES;  Surgeon: Leslye Peer, MD;  Location: [facility] ENDOSCOPY;  Service: Pulmonary;;   BRONCHIAL BRUSHINGS  06/11/2021   Procedure: BRONCHIAL BRUSHINGS;  Surgeon: Leslye Peer, MD;  Location: [facility] ENDOSCOPY;  Service: Pulmonary;;   BRONCHIAL NEEDLE ASPIRATION BIOPSY  06/11/2021   Procedure: BRONCHIAL NEEDLE ASPIRATION BIOPSIES;  Surgeon: Leslye Peer, MD;  Location: [facility] ENDOSCOPY;  Service: Pulmonary;;   CARDIOVERSION N/A 09/20/2021   Procedure: CARDIOVERSION;  Surgeon: [name], MD;  Location: [facility] ORS;  Service: Cardiovascular;  Laterality: N/A;   COLONOSCOPY N/A 04/14/2019   Procedure: COLONOSCOPY;  Surgeon: Corbin Ade, MD;  Location: [facility] ENDO SUITE;  Service: Endoscopy;  Laterality: N/A;  9:30   COLONOSCOPY WITH PROPOFOL N/A 07/25/2021   Procedure: COLONOSCOPY WITH PROPOFOL;  Surgeon: Lanelle Bal, DO;   Location: [facility] ENDO SUITE;  Service: Endoscopy;  Laterality: N/A;  1:00pm   FIDUCIAL MARKER PLACEMENT  06/11/2021   Procedure: FIDUCIAL MARKER PLACEMENT;  Surgeon: Leslye Peer, MD;  Location: [facility] ENDOSCOPY;  Service: Pulmonary;;   INTERCOSTAL NERVE BLOCK Right 11/12/2021   Procedure: INTERCOSTAL NERVE BLOCK;  Surgeon: Loreli Slot, MD;  Location: [facility] OR;  Service: Thoracic;  Laterality: Right;   IR IMAGING GUIDED PORT INSERTION  07/31/2021   Left cataract surgery     LYMPH NODE DISSECTION Right 11/12/2021   Procedure: LYMPH NODE DISSECTION;  Surgeon: Loreli Slot, MD;  Location: [facility] OR;  Service: Thoracic;  Laterality: Right;   POLYPECTOMY  04/14/2019   Procedure: POLYPECTOMY;  Surgeon: Corbin Ade, MD;  Location: [facility] END[facility] SU[facility];  Service: Endoscopy;;   POLYPECTOMY  07/25/2021   Procedure: POLYPECTOMY;  Surgeon: Lanelle Bal, DO;  Location: [facility] ENDO SU[facility];  Service: Endoscopy;;   TEE WITHOUT CARDIOVERSION N/A 09/20/2021   Procedure: TRANSESOPHAGEAL ECHOCARDIOGRAM (TEE);  Surgeon: [name], MD;  Location: [facility] ORS;  Service: Cardiovascular;  Laterality: N/A;   TRANSFORAMINAL LUMBAR INTERBODY FUSION (TLIF) WITH PEDICLE SCREW FIXATION 1 LEVEL N/A 04/27/2020   Procedure: Transforaminal Lumbar Interbody Fusion Lumbar Five-Sacral One;  Surgeon: Bedelia Person, MD;  Location: [facility] OR;  Service: Neurosurgery;  Laterality: N/A;   VIDEO BRONCHOSCOPY WITH INSERTION OF INTERBRONCHIAL VALVE (IBV) N/A 11/22/2021   Procedure: VIDEO BRONCHOSCOPY WITH INSERTION OF INTERBRONCHIAL VALVE (IBV);  Surgeon: Loreli Slot, MD;  Location: [facility] OR;  Service: Thoracic;  Laterality: N/A;   VIDEO BRONCHOSCOPY WITH INSERTION OF INTERBRONCHIAL VALVE (IBV) N/A 01/24/2022   Procedure: VIDEO BRONCHOSCOPY WITH REMOVAL OF INTERBRONCHIAL VALVE (IBV);  Surgeon: Loreli Slot, MD;  Location: [facility] OR;  Service: Thoracic;  Laterality: N/A;   VIDEO BRONCHOSCOPY WITH RADIAL ENDOBRONCHIAL  ULTRASOUND  06/11/2021   Procedure: VIDEO BRONCHOSCOPY WITH RADIAL ENDOBRONCHIAL ULTRASOUND;  Surgeon: Leslye Peer, MD;  Location: [facility] ENDOSCOPY;  Service: Pulmonary;;    Social History: Social History   Socioeconomic History   Marital status: Divorced    Spouse name: Not on file   Number of children: 1   Years of education: 11   Highest education level: 11th grade  Occupational History    Employer: Engineer, materials  Tobacco Use   Smoking status: Former    Packs/day: 1.00    Years: 40.00    Additional pack years: 0.00    Total pack years: 40.00    Types: Cigarettes    Quit date: 07/2021    Years since quitting: 0.7   Smokeless tobacco: Never  Tobacco comments:    1 pack of cigarettes smoked daily. 07/17/21 ARJ, RN   Vaping Use   Vaping Use: Never used  Substance and Sexual Activity   Alcohol use: No    Comment: Prior history of regular alcohol use   Drug use: No   Sexual activity: Not Currently  Other Topics Concern   Not on file  Social History Narrative   Not on file   Social Determinants of Health   Financial Resource Strain: Low Risk  (11/29/2019)   Overall Financial Resource Strain (CARDIA)    Difficulty of Paying Living Expenses: Not hard at all  Food Insecurity: No Food Insecurity (11/29/2021)   Hunger Vital Sign    Worried About Running Out of Food in the Last Year: Never true    Ran Out of Food in the Last Year: Never true  Transportation Needs: No Transportation Needs (11/29/2021)   PRAPARE - Administrator, Civil Service (Medical): No    Lack of Transportation (Non-Medical): No  Physical Activity: Inactive (11/29/2019)   Exercise Vital Sign    Days of Exercise per Week: 0 days    Minutes of Exercise per Session: 0 min  Stress: No Stress Concern Present (11/29/2019)   [employer] of Occupational Health - Occupational Stress Questionnaire    Feeling of Stress : Only a little  Social Connections: Moderately Isolated (11/29/2019)    Social Connection and Isolation Panel [NHANES]    Frequency of Communication with Friends and Family: More than three times a week    Frequency of Social Gatherings with Friends and Family: More than three times a week    Attends Religious Services: 1 to 4 times per year    Active Member of [employer] or Organizations: No    Attends Banker Meetings: Never    Marital Status: Divorced  Catering manager Violence: Not At Risk (11/13/2021)   Humiliation, Afraid, Rape, and Kick questionnaire    Fear of Current or Ex-Partner: No    Emotionally Abused: No    Physically Abused: No    Sexually Abused: No    Family History: Family History  Problem Relation Age of Onset   Hyperlipidemia Sister    Heart attack Brother 69   Cancer - Colon Neg Hx     Current Medications:  Current Outpatient Medications:    acetaminophen (TYLENOL) 500 MG tablet, Take 2 tablets (1,000 mg total) by mouth every 6 (six) hours as needed. (Patient taking differently: Take 1,000 mg by mouth 2 (two) times daily as needed for moderate pain.), Disp: 30 tablet, Rfl: 0   albuterol (VENTOLIN HFA) 108 (90 Base) MCG/ACT inhaler, Inhale 2 puffs into the lungs every 6 (six) hours as needed for wheezing or shortness of breath., Disp: 6.7 g, Rfl: 6   atorvastatin (LIPITOR) 80 MG tablet, Take 80 mg by mouth daily., Disp: , Rfl:    cyclobenzaprine (FLEXERIL) 10 MG tablet, Take 1 tablet (10 mg total) by mouth 2 (two) times daily as needed., Disp: 180 tablet, Rfl: 3   escitalopram (LEXAPRO) 10 MG tablet, Take 1 tablet (10 mg total) by mouth daily., Disp: 90 tablet, Rfl: 3   folic acid (FOLVITE) 1 MG tablet, Take 1 tablet (1 mg total) by mouth daily., Disp: 90 tablet, Rfl: 2   furosemide (LASIX) 20 MG tablet, Take 1 tablet (20 mg total) by mouth daily as needed. (Patient taking differently: Take 20 mg by mouth in the morning.), Disp: 90 tablet, Rfl: 2  lidocaine-prilocaine (EMLA) cream, Apply a small amount to port a cath site  and cover with plastic wrap 1 hour prior to infusion appointments, Disp: 30 g, Rfl: 3   Magnesium Oxide 400 MG CAPS, Take 1 capsule (400 mg total) by mouth daily., Disp: , Rfl: 0   metoprolol succinate (TOPROL-XL) 50 MG 24 hr tablet, Take 1.5 tablets (75 mg total) by mouth daily. Take with or immediately following a meal., Disp: 135 tablet, Rfl: 3   prochlorperazine (COMPAZINE) 10 MG tablet, Take 1 tablet (10 mg total) by mouth every 6 (six) hours as needed for nausea/vomiting, Disp: 60 tablet, Rfl: 3   Tiotropium Bromide Monohydrate (SPIRIVA RESPIMAT) 2.5 MCG/ACT AERS, Inhale 2 puffs into the lungs daily., Disp: 4 g, Rfl: 0   traMADol (ULTRAM) 50 MG tablet, Take 1 tablet (50 mg total) by mouth every 6 (six) hours as needed (mild pain)., Disp: 30 tablet, Rfl: 0   apixaban (ELIQUIS) 5 MG TABS tablet, Take 1 tablet (5 mg total) by mouth 2 (two) times daily., Disp: 180 tablet, Rfl: 3   Allergies: No Known Allergies  REVIEW OF SYSTEMS:   Review of Systems  Constitutional:  Negative for chills, fatigue and fever.  HENT:   Negative for lump/mass, mouth sores, nosebleeds, sore throat and trouble swallowing.   Eyes:  Negative for eye problems.  Respiratory:  Negative for cough and shortness of breath.   Cardiovascular:  Negative for chest pain, leg swelling and palpitations.  Gastrointestinal:  Negative for abdominal pain, constipation, diarrhea, nausea and vomiting.  Genitourinary:  Negative for bladder incontinence, difficulty urinating, dysuria, frequency, hematuria and nocturia.   Musculoskeletal:  Negative for arthralgias, back pain, flank pain, myalgias and neck pain.  Skin:  Negative for itching and rash.  Neurological:  Positive for headaches. Negative for dizziness and numbness.  Hematological:  Does not bruise/bleed easily.  Psychiatric/Behavioral:  Negative for depression, sleep disturbance and suicidal ideas. The patient is not nervous/anxious.   All other systems reviewed and are  negative.    VITALS:   There were no vitals taken for this visit.  Wt Readings from Last 3 Encounters:  04/22/22 165 lb 12.8 oz (75.2 kg)  04/01/22 169 lb 6.4 oz (76.8 kg)  03/27/22 161 lb (73 kg)    There is no height or weight on file to calculate BMI.  Performance status (ECOG): 1 - Symptomatic but completely ambulatory  PHYSICAL EXAM:   Physical Exam Vitals and nursing note reviewed. Exam conducted with a chaperone present.  Constitutional:      Appearance: Normal appearance.  Cardiovascular:     Rate and Rhythm: Normal rate and regular rhythm.     Pulses: Normal pulses.     Heart sounds: Normal heart sounds.  Pulmonary:     Effort: Pulmonary effort is normal.     Breath sounds: Normal breath sounds.  Abdominal:     Palpations: Abdomen is soft. There is no hepatomegaly, splenomegaly or mass.     Tenderness: There is no abdominal tenderness.  Musculoskeletal:     Right lower leg: No edema.     Left lower leg: No edema.  Lymphadenopathy:     Cervical: No cervical adenopathy.     Right cervical: No superficial, deep or posterior cervical adenopathy.    Left cervical: No superficial, deep or posterior cervical adenopathy.     Upper Body:     Right upper body: No supraclavicular or axillary adenopathy.     Left upper body: No supraclavicular or  axillary adenopathy.  Neurological:     General: No focal deficit present.     Mental Status: He is alert and oriented to person, place, and time.  Psychiatric:        Mood and Affect: Mood normal.        Behavior: Behavior normal.     LABS:      Latest Ref Rng & Units 04/22/2022    9:17 AM 04/01/2022    9:11 AM 03/11/2022   10:22 AM  CBC  WBC 4.0 - 10.5 K/uL 6.8  7.0  7.6   Hemoglobin 13.0 - 17.0 g/dL 40.9  81.1  91.4   Hematocrit 39.0 - 52.0 % 33.3  33.1  32.8   Platelets 150 - 400 K/uL 126  142  136       Latest Ref Rng & Units 04/22/2022    9:17 AM 04/01/2022    9:11 AM 03/11/2022   10:22 AM  CMP  Glucose 70 - 99  mg/dL 96  782  956   BUN 8 - 23 mg/dL 22  14  15    Creatinine 0.61 - 1.24 mg/dL 2.13  0.86  5.78   Sodium 135 - 145 mmol/L 139  134  137   Potassium 3.5 - 5.1 mmol/L 3.5  3.8  3.9   Chloride 98 - 111 mmol/L 107  103  105   CO2 22 - 32 mmol/L 22  24  25    Calcium 8.9 - 10.3 mg/dL 8.9  8.5  8.7   Total Protein 6.5 - 8.1 g/dL 6.9  6.6  6.6   Total Bilirubin 0.3 - 1.2 mg/dL 0.6  0.7  1.0   Alkaline Phos 38 - 126 U/L 64  56  50   AST 15 - 41 U/L 23  21  26    ALT 0 - 44 U/L 17  16  17       No results found for: "CEA1", "CEA" / No results found for: "CEA1", "CEA" Lab Results  Component Value Date   PSA1 1.2 11/25/2019   No results found for: "ION629" No results found for: "CAN125"  No results found for: "TOTALPROTELP", "ALBUMINELP", "A1GS", "A2GS", "BETS", "BETA2SER", "GAMS", "MSPIKE", "SPEI" Lab Results  Component Value Date   TIBC 291 04/01/2022   TIBC 297 03/11/2022   TIBC 242 (L) 07/13/2020   FERRITIN 86 04/01/2022   FERRITIN 117 03/11/2022   FERRITIN 248 07/13/2020   IRONPCTSAT 22 04/01/2022   IRONPCTSAT 24 03/11/2022   IRONPCTSAT 20 07/13/2020   No results found for: "LDH"   STUDIES:   CT Chest W Contrast  Result Date: 04/20/2022 CLINICAL DATA:  77 year old male with history of non-small cell lung cancer. Follow-up study. EXAM: CT CHEST WITH CONTRAST TECHNIQUE: Multidetector CT imaging of the chest was performed during intravenous contrast administration. RADIATION DOSE REDUCTION: This exam was performed according to the departmental dose-optimization program which includes automated exposure control, adjustment of the mA and/or kV according to patient size and/or use of iterative reconstruction technique. CONTRAST:  75mL OMNIPAQUE IOHEXOL 300 MG/ML  SOLN COMPARISON:  Chest CT 10/08/2021. FINDINGS: Cardiovascular: Heart size is normal. There is no significant pericardial fluid, thickening or pericardial calcification. There is aortic atherosclerosis, as well as  atherosclerosis of the great vessels of the mediastinum and the coronary arteries, including calcified atherosclerotic plaque in the left main, left anterior descending, left circumflex and right coronary arteries. Mediastinum/Nodes: No pathologically enlarged mediastinal or hilar lymph nodes. Esophagus is unremarkable in appearance. No axillary lymphadenopathy.  Lungs/Pleura: Status post right upper lobectomy with compensatory hyperexpansion of the right middle and lower lobes. Small volume of pleural fluid and small amount of gas in the apex of the right hemithorax, favored to be chronic, but no prior studies are available for comparison postoperatively. No left pleural effusion. New pleural-based nodule measuring 9 x 6 mm in the posterior aspect of the right lower lobe (axial image 112 of series 7). New 5 mm left lower lobe pulmonary nodule (axial image 99 of series 7). No acute consolidative airspace disease. 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 Low-attenuation lesions in both kidneys compatible with simple cysts, largest of which is exophytic measuring 1.7 cm in the upper pole of the left kidney (no imaging follow-up recommended). Musculoskeletal: There are no aggressive appearing lytic or blastic lesions noted in the visualized portions of the skeleton. IMPRESSION: 1. Status post right upper lobectomy with new gas and fluid collection in the apex of the right hemithorax, potentially chronic. This is unlikely of clinical significance, but attention on follow-up studies is recommended to ensure resolution of the gas component of this collection. 2. Small pulmonary nodules in the lungs bilaterally, new compared to the prior study. These are nonspecific and could be infectious or inflammatory in etiology, however, close attention on follow-up studies is recommended to  ensure regression. 3. Aortic atherosclerosis, in addition to left main and three-vessel coronary artery disease. There is also aneurysmal dilatation of the abdominal aorta which measures up to 4.7 x 4.1 cm in the suprarenal region. Recommend follow-up CT/MR every 6 months and vascular consultation. This recommendation follows ACR consensus guidelines: White Paper of the ACR Incidental Findings Committee II on Vascular Findings. J Am Coll Radiol 2013; 10:789-794. Aortic Atherosclerosis (ICD10-I70.0). Aortic aneurysm NOS (ICD10-I71.9). Electronically Signed   By: Trudie Reed M.D.   On: 04/20/2022 14:01

## 2022-04-22 ENCOUNTER — Other Ambulatory Visit: Payer: Self-pay

## 2022-04-22 ENCOUNTER — Inpatient Hospital Stay (HOSPITAL_BASED_OUTPATIENT_CLINIC_OR_DEPARTMENT_OTHER): Payer: HMO | Admitting: Hematology

## 2022-04-22 ENCOUNTER — Inpatient Hospital Stay: Payer: HMO | Attending: Hematology

## 2022-04-22 ENCOUNTER — Inpatient Hospital Stay: Payer: HMO

## 2022-04-22 VITALS — BP 124/73 | HR 71 | Temp 97.1°F | Resp 16 | Wt 165.8 lb

## 2022-04-22 VITALS — BP 135/60 | HR 56 | Temp 96.4°F | Resp 18

## 2022-04-22 DIAGNOSIS — Z7901 Long term (current) use of anticoagulants: Secondary | ICD-10-CM | POA: Insufficient documentation

## 2022-04-22 DIAGNOSIS — Z87891 Personal history of nicotine dependence: Secondary | ICD-10-CM | POA: Insufficient documentation

## 2022-04-22 DIAGNOSIS — Z801 Family history of malignant neoplasm of trachea, bronchus and lung: Secondary | ICD-10-CM | POA: Insufficient documentation

## 2022-04-22 DIAGNOSIS — I252 Old myocardial infarction: Secondary | ICD-10-CM | POA: Diagnosis not present

## 2022-04-22 DIAGNOSIS — J449 Chronic obstructive pulmonary disease, unspecified: Secondary | ICD-10-CM | POA: Diagnosis not present

## 2022-04-22 DIAGNOSIS — F32A Depression, unspecified: Secondary | ICD-10-CM | POA: Insufficient documentation

## 2022-04-22 DIAGNOSIS — C3411 Malignant neoplasm of upper lobe, right bronchus or lung: Secondary | ICD-10-CM

## 2022-04-22 DIAGNOSIS — I4891 Unspecified atrial fibrillation: Secondary | ICD-10-CM | POA: Diagnosis not present

## 2022-04-22 DIAGNOSIS — D649 Anemia, unspecified: Secondary | ICD-10-CM | POA: Insufficient documentation

## 2022-04-22 DIAGNOSIS — Z5112 Encounter for antineoplastic immunotherapy: Secondary | ICD-10-CM | POA: Insufficient documentation

## 2022-04-22 DIAGNOSIS — I714 Abdominal aortic aneurysm, without rupture, unspecified: Secondary | ICD-10-CM | POA: Diagnosis not present

## 2022-04-22 DIAGNOSIS — E785 Hyperlipidemia, unspecified: Secondary | ICD-10-CM | POA: Diagnosis not present

## 2022-04-22 DIAGNOSIS — Z95828 Presence of other vascular implants and grafts: Secondary | ICD-10-CM

## 2022-04-22 DIAGNOSIS — D509 Iron deficiency anemia, unspecified: Secondary | ICD-10-CM

## 2022-04-22 DIAGNOSIS — Z8673 Personal history of transient ischemic attack (TIA), and cerebral infarction without residual deficits: Secondary | ICD-10-CM | POA: Diagnosis not present

## 2022-04-22 DIAGNOSIS — I10 Essential (primary) hypertension: Secondary | ICD-10-CM | POA: Insufficient documentation

## 2022-04-22 DIAGNOSIS — I2510 Atherosclerotic heart disease of native coronary artery without angina pectoris: Secondary | ICD-10-CM | POA: Diagnosis not present

## 2022-04-22 DIAGNOSIS — I700 Atherosclerosis of aorta: Secondary | ICD-10-CM | POA: Diagnosis not present

## 2022-04-22 DIAGNOSIS — R944 Abnormal results of kidney function studies: Secondary | ICD-10-CM | POA: Diagnosis not present

## 2022-04-22 LAB — CBC WITH DIFFERENTIAL/PLATELET
Abs Immature Granulocytes: 0.01 10*3/uL (ref 0.00–0.07)
Basophils Absolute: 0.1 10*3/uL (ref 0.0–0.1)
Basophils Relative: 1 %
Eosinophils Absolute: 0.2 10*3/uL (ref 0.0–0.5)
Eosinophils Relative: 4 %
HCT: 33.3 % — ABNORMAL LOW (ref 39.0–52.0)
Hemoglobin: 10.8 g/dL — ABNORMAL LOW (ref 13.0–17.0)
Immature Granulocytes: 0 %
Lymphocytes Relative: 22 %
Lymphs Abs: 1.5 10*3/uL (ref 0.7–4.0)
MCH: 30.5 pg (ref 26.0–34.0)
MCHC: 32.4 g/dL (ref 30.0–36.0)
MCV: 94.1 fL (ref 80.0–100.0)
Monocytes Absolute: 0.7 10*3/uL (ref 0.1–1.0)
Monocytes Relative: 10 %
Neutro Abs: 4.3 10*3/uL (ref 1.7–7.7)
Neutrophils Relative %: 63 %
Platelets: 126 10*3/uL — ABNORMAL LOW (ref 150–400)
RBC: 3.54 MIL/uL — ABNORMAL LOW (ref 4.22–5.81)
RDW: 13.9 % (ref 11.5–15.5)
WBC: 6.8 10*3/uL (ref 4.0–10.5)
nRBC: 0 % (ref 0.0–0.2)

## 2022-04-22 LAB — COMPREHENSIVE METABOLIC PANEL
ALT: 17 U/L (ref 0–44)
AST: 23 U/L (ref 15–41)
Albumin: 3.4 g/dL — ABNORMAL LOW (ref 3.5–5.0)
Alkaline Phosphatase: 64 U/L (ref 38–126)
Anion gap: 10 (ref 5–15)
BUN: 22 mg/dL (ref 8–23)
CO2: 22 mmol/L (ref 22–32)
Calcium: 8.9 mg/dL (ref 8.9–10.3)
Chloride: 107 mmol/L (ref 98–111)
Creatinine, Ser: 1.27 mg/dL — ABNORMAL HIGH (ref 0.61–1.24)
GFR, Estimated: 59 mL/min — ABNORMAL LOW (ref >60)
Glucose, Bld: 96 mg/dL (ref 70–99)
Potassium: 3.5 mmol/L (ref 3.5–5.1)
Sodium: 139 mmol/L (ref 135–145)
Total Bilirubin: 0.6 mg/dL (ref 0.3–1.2)
Total Protein: 6.9 g/dL (ref 6.5–8.1)

## 2022-04-22 LAB — MAGNESIUM: Magnesium: 1.9 mg/dL (ref 1.7–2.4)

## 2022-04-22 LAB — TSH: TSH: 1.344 u[IU]/mL (ref 0.350–4.500)

## 2022-04-22 MED ORDER — SODIUM CHLORIDE 0.9% FLUSH
10.0000 mL | INTRAVENOUS | Status: DC | PRN
  Administered 2022-04-22: 10 mL

## 2022-04-22 MED ORDER — APIXABAN 5 MG PO TABS
5.0000 mg | ORAL_TABLET | Freq: Two times a day (BID) | ORAL | 3 refills | Status: DC
  Filled 2022-04-22: qty 180, 90d supply, fill #0
  Filled 2022-06-06: qty 180, 90d supply, fill #1

## 2022-04-22 MED ORDER — HEPARIN SOD (PORK) LOCK FLUSH 100 UNIT/ML IV SOLN
500.0000 [IU] | Freq: Once | INTRAVENOUS | Status: AC | PRN
  Administered 2022-04-22: 500 [IU]

## 2022-04-22 MED ORDER — SODIUM CHLORIDE 0.9 % IV SOLN: Freq: Once | INTRAVENOUS | Status: AC

## 2022-04-22 MED ORDER — SODIUM CHLORIDE 0.9 % IV SOLN
200.0000 mg | Freq: Once | INTRAVENOUS | Status: AC
  Administered 2022-04-22: 200 mg via INTRAVENOUS
  Filled 2022-04-22: qty 8

## 2022-04-22 MED ORDER — SODIUM CHLORIDE 0.9% FLUSH
10.0000 mL | Freq: Once | INTRAVENOUS | Status: AC
  Administered 2022-04-22: 10 mL via INTRAVENOUS

## 2022-04-22 NOTE — Progress Notes (Signed)
Patient presents today for Keytruda infusion. Patient is in satisfactory condition with no new complaints voiced.  Vital signs are stable.  Labs reviewed by Dr. Delton Coombes during the office visit and all labs are within treatment parameters. Pt will also receive 500 mL of normal saline over 1 hour We will proceed with treatment per MD orders.  Keytruda and 500 mL of normal saline given today per MD orders. Tolerated infusion without adverse affects. Vital signs stable. No complaints at this time. Discharged from clinic ambulatory with walker in stable condition. Alert and oriented x 3. F/U with [facility] as scheduled.

## 2022-04-22 NOTE — Patient Instructions (Signed)
[facility]  Discharge Instructions: Thank you for choosing [facility] to provide your oncology and hematology care.  If you have a lab appointment with the [facility], please come in thru the Main Entrance and check in at the main information desk.  Wear comfortable clothing and clothing appropriate for easy access to any Portacath or PICC line.   We strive to give you quality time with your provider. You may need to reschedule your appointment if you arrive late (15 or more minutes).  Arriving late affects you and other patients whose appointments are after yours.  Also, if you miss three or more appointments without notifying the office, you may be dismissed from the clinic at the provider's discretion.      For prescription refill requests, have your pharmacy contact our office and allow 72 hours for refills to be completed.    Today you received the following chemotherapy and/or immunotherapy agents Keytruda and 590mL of NS over 1 hour.   To help prevent nausea and vomiting after your treatment, we encourage you to take your nausea medication as directed.  Pembrolizumab Injection What is this medication? PEMBROLIZUMAB (PEM broe LIZ ue mab) treats some types of cancer. It works by helping your immune system slow or stop the spread of cancer cells. It is a monoclonal antibody. This medicine may be used for other purposes; ask your health care provider or pharmacist if you have questions. COMMON BRAND NAME(S): Keytruda What should I tell my care team before I take this medication? They need to know if you have any of these conditions: Allogeneic stem cell transplant (uses someone else's stem cells) Autoimmune diseases, such as Crohn disease, ulcerative colitis, lupus History of chest radiation Nervous system problems, such as Guillain-Barre syndrome, myasthenia gravis Organ transplant An unusual or allergic reaction to pembrolizumab, other  medications, foods, dyes, or preservatives Pregnant or trying to get pregnant Breast-feeding How should I use this medication? This medication is injected into a vein. It is given by your care team in a hospital or clinic setting. A special MedGuide will be given to you before each treatment. Be sure to read this information carefully each time. Talk to your care team about the use of this medication in children. While it may be prescribed for children as young as 6 months for selected conditions, precautions do apply. Overdosage: If you think you have taken too much of this medicine contact a poison control center or emergency room at once. NOTE: This medicine is only for you. Do not share this medicine with others. What if I miss a dose? Keep appointments for follow-up doses. It is important not to miss your dose. Call your care team if you are unable to keep an appointment. What may interact with this medication? Interactions have not been studied. This list may not describe all possible interactions. Give your health care provider a list of all the medicines, herbs, non-prescription drugs, or dietary supplements you use. Also tell them if you smoke, drink alcohol, or use illegal drugs. Some items may interact with your medicine. What should I watch for while using this medication? Your condition will be monitored carefully while you are receiving this medication. You may need blood work while taking this medication. This medication may cause serious skin reactions. They can happen weeks to months after starting the medication. Contact your care team right away if you notice fevers or flu-like symptoms with a rash. The rash may be  red or purple and then turn into blisters or peeling of the skin. You may also notice a red rash with swelling of the face, lips, or lymph nodes in your neck or under your arms. Tell your care team right away if you have any change in your eyesight. Talk to your care  team if you may be pregnant. Serious birth defects can occur if you take this medication during pregnancy and for 4 months after the last dose. You will need a negative pregnancy test before starting this medication. Contraception is recommended while taking this medication and for 4 months after the last dose. Your care team can help you find the option that works for you. Do not breastfeed while taking this medication and for 4 months after the last dose. What side effects may I notice from receiving this medication? Side effects that you should report to your care team as soon as possible: Allergic reactions--skin rash, itching, hives, swelling of the face, lips, tongue, or throat Dry cough, shortness of breath or trouble breathing Eye pain, redness, irritation, or discharge with blurry or decreased vision Heart muscle inflammation--unusual weakness or fatigue, shortness of breath, chest pain, fast or irregular heartbeat, dizziness, swelling of the ankles, feet, or hands Hormone gland problems--headache, sensitivity to light, unusual weakness or fatigue, dizziness, fast or irregular heartbeat, increased sensitivity to cold or heat, excessive sweating, constipation, hair loss, increased thirst or amount of urine, tremors or shaking, irritability Infusion reactions--chest pain, shortness of breath or trouble breathing, feeling faint or lightheaded Kidney injury (glomerulonephritis)--decrease in the amount of urine, red or dark brown urine, foamy or bubbly urine, swelling of the ankles, hands, or feet Liver injury--right upper belly pain, loss of appetite, nausea, light-colored stool, dark yellow or brown urine, yellowing skin or eyes, unusual weakness or fatigue Pain, tingling, or numbness in the hands or feet, muscle weakness, change in vision, confusion or trouble speaking, loss of balance or coordination, trouble walking, seizures Rash, fever, and swollen lymph nodes Redness, blistering, peeling,  or loosening of the skin, including inside the mouth Sudden or severe stomach pain, bloody diarrhea, fever, nausea, vomiting Side effects that usually do not require medical attention (report to your care team if they continue or are bothersome): Bone, joint, or muscle pain Diarrhea Fatigue Loss of appetite Nausea Skin rash This list may not describe all possible side effects. Call your doctor for medical advice about side effects. You may report side effects to FDA at 1-800-FDA-1088. Where should I keep my medication? This medication is given in a hospital or clinic. It will not be stored at home. NOTE: This sheet is a summary. It may not cover all possible information. If you have questions about this medicine, talk to your doctor, pharmacist, or health care provider.  2023 Elsevier/Gold Standard (2021-05-22 00:00:00)   BELOW ARE SYMPTOMS THAT SHOULD BE REPORTED IMMEDIATELY: *FEVER GREATER THAN 100.4 F (38 C) OR HIGHER *CHILLS OR SWEATING *NAUSEA AND VOMITING THAT IS NOT CONTROLLED WITH YOUR NAUSEA MEDICATION *UNUSUAL SHORTNESS OF BREATH *UNUSUAL BRUISING OR BLEEDING *URINARY PROBLEMS (pain or burning when urinating, or frequent urination) *BOWEL PROBLEMS (unusual diarrhea, constipation, pain near the anus) TENDERNESS IN MOUTH AND THROAT WITH OR WITHOUT PRESENCE OF ULCERS (sore throat, sores in mouth, or a toothache) UNUSUAL RASH, SWELLING OR PAIN  UNUSUAL VAGINAL DISCHARGE OR ITCHING   Items with * indicate a potential emergency and should be followed up as soon as possible or go to the Emergency Department if any problems  should occur.  Please show the CHEMOTHERAPY ALERT CARD or IMMUNOTHERAPY ALERT CARD at check-in to the Emergency Department and triage nurse.  Should you have questions after your visit or need to cancel or reschedule your appointment, please contact [facility] [phone]  and follow the prompts.  Office hours are 8:00 a.m. to 4:30 p.m.  Monday - Friday. Please note that voicemails left after 4:00 p.m. may not be returned until the following business day.  We are closed weekends and major holidays. You have access to a nurse at all times for urgent questions. Please call the main number to the clinic [phone] and follow the prompts.  For any non-urgent questions, you may also contact your provider using MyChart. We now offer e-Visits for anyone 83 and older to request care online for non-urgent symptoms. For details visit [email or web address].   Also download the MyChart app! Go to the app store, search "MyChart", open the app, select [facility], and log in with your MyChart username and password.

## 2022-04-22 NOTE — Patient Instructions (Addendum)
[facility] at [facility] Discharge Instructions   You were seen and examined today by Dr. Delton Coombes.  He reviewed the results of your lab work which are normal/stable.   He reviewed the results of your CT scan which is stable.   We will proceed with your treatment today.   You should use a good, thick moisturizing lotion twice a day to help with the dry, itchy skin.   We will arrange for you to have IV iron infusions to help with your energy and hemoglobin.   Return as scheduled.    Thank you for choosing [facility] at [facility] to provide your oncology and hematology care.  To afford each patient quality time with our provider, please arrive at least 15 minutes before your scheduled appointment time.   If you have a lab appointment with the [facility] please come in thru the Main Entrance and check in at the main information desk.  You need to re-schedule your appointment should you arrive 10 or more minutes late.  We strive to give you quality time with our providers, and arriving late affects you and other patients whose appointments are after yours.  Also, if you no show three or more times for appointments you may be dismissed from the clinic at the providers discretion.     Again, thank you for choosing [facility].  Our hope is that these requests will decrease the amount of time that you wait before being seen by our physicians.       _____________________________________________________________  Should you have questions after your visit to [facility], please contact our office at [phone] and follow the prompts.  Our office hours are 8:00 a.m. and 4:30 p.m. Monday - Friday.  Please note that voicemails left after 4:00 p.m. may not be returned until the following business day.  We are closed weekends and major holidays.  You do have access to a nurse 24-7, just call the main number to the clinic  [phone] and do not press any options, hold on the line and a nurse will answer the phone.    For prescription refill requests, have your pharmacy contact our office and allow 72 hours.    Due to Covid, you will need to wear a mask upon entering the hospital. If you do not have a mask, a mask will be given to you at the Main Entrance upon arrival. For doctor visits, patients may have 1 support person age 102 or older with them. For treatment visits, patients can not have anyone with them due to social distancing guidelines and our immunocompromised population.

## 2022-04-22 NOTE — Progress Notes (Signed)
Patient has been examined by Dr. Katragadda. Vital signs and labs have been reviewed by MD - ANC, Creatinine, LFTs, hemoglobin, and platelets are within treatment parameters per M.D. - pt may proceed with treatment.  Primary RN and pharmacy notified.  

## 2022-04-23 ENCOUNTER — Other Ambulatory Visit: Payer: Self-pay

## 2022-04-26 ENCOUNTER — Inpatient Hospital Stay: Payer: HMO

## 2022-04-26 VITALS — BP 126/57 | HR 55 | Temp 97.3°F | Resp 16

## 2022-04-26 DIAGNOSIS — S72009A Fracture of unspecified part of neck of unspecified femur, initial encounter for closed fracture: Secondary | ICD-10-CM | POA: Diagnosis not present

## 2022-04-26 DIAGNOSIS — D509 Iron deficiency anemia, unspecified: Secondary | ICD-10-CM

## 2022-04-26 DIAGNOSIS — S72001A Fracture of unspecified part of neck of right femur, initial encounter for closed fracture: Secondary | ICD-10-CM | POA: Diagnosis not present

## 2022-04-26 MED ORDER — SODIUM CHLORIDE 0.9 % IV SOLN: Freq: Once | INTRAVENOUS | Status: AC

## 2022-04-26 MED ORDER — HEPARIN SOD (PORK) LOCK FLUSH 100 UNIT/ML IV SOLN
500.0000 [IU] | Freq: Once | INTRAVENOUS | Status: AC | PRN
  Administered 2022-04-26: 500 [IU]

## 2022-04-26 MED ORDER — SODIUM CHLORIDE 0.9% FLUSH
10.0000 mL | Freq: Once | INTRAVENOUS | Status: AC | PRN
  Administered 2022-04-26: 10 mL

## 2022-04-26 MED ORDER — SODIUM CHLORIDE 0.9 % IV SOLN
510.0000 mg | Freq: Once | INTRAVENOUS | Status: AC
  Administered 2022-04-26: 510 mg via INTRAVENOUS
  Filled 2022-04-26: qty 510

## 2022-04-26 NOTE — Patient Instructions (Signed)
[facility]  Discharge Instructions: Thank you for choosing [facility] to provide your oncology and hematology care.  If you have a lab appointment with the Cancer Center - please note that after April 8th, 2024, all labs will be drawn in the cancer center.  You do not have to check in or register with the main entrance as you have in the past but will complete your check-in in the cancer center.  Wear comfortable clothing and clothing appropriate for easy access to any Portacath or PICC line.   We strive to give you quality time with your provider. You may need to reschedule your appointment if you arrive late (15 or more minutes).  Arriving late affects you and other patients whose appointments are after yours.  Also, if you miss three or more appointments without notifying the office, you may be dismissed from the clinic at the provider's discretion.      For prescription refill requests, have your pharmacy contact our office and allow 72 hours for refills to be completed.    Today you received the following Fera[place]me, return as scheduled.   To help prevent nausea and vomiting after your treatment, we encourage you to take your nausea medication as directed.  BELOW ARE SYMPTOMS THAT SHOULD BE REPORTED IMMEDIATELY: *FEVER GREATER THAN 100.4 F (38 C) OR HIGHER *CHILLS OR SWEATING *NAUSEA AND VOMITING THAT IS NOT CONTROLLED WITH YOUR NAUSEA MEDICATION *UNUSUAL SHORTNESS OF BREATH *UNUSUAL BRUISING OR BLEEDING *URINARY PROBLEMS (pain or burning when urinating, or frequent urination) *BOWEL PROBLEMS (unusual diarrhea, constipation, pain near the anus) TENDERNESS IN MOUTH AND THROAT WITH OR WITHOUT PRESENCE OF ULCERS (sore throat, sores in mouth, or a toothache) UNUSUAL RASH, SWELLING OR PAIN  UNUSUAL VAGINAL DISCHARGE OR ITCHING   Items with * indicate a potential emergency and should be followed up as soon as possible or go to the Emergency Department if  any problems should occur.  Please show the CHEMOTHERAPY ALERT CARD or IMMUNOTHERAPY ALERT CARD at check-in to the Emergency Department and triage nurse.  Should you have questions after your visit or need to cancel or reschedule your appointment, please contact [facility] [phone]  and follow the prompts.  Office hours are 8:00 a.m. to 4:30 p.m. Monday - Friday. Please note that voicemails left after 4:00 p.m. may not be returned until the following business day.  We are closed weekends and major holidays. You have access to a nurse at all times for urgent questions. Please call the main number to the clinic [phone] and follow the prompts.  For any non-urgent questions, you may also contact your provider using MyChart. We now offer e-Visits for anyone 18 and older to request care online for non-urgent symptoms. For details visit [email or web address].   Also download the MyChart app! Go to the app store, search "MyChart", open the app, select [facility], and log in with your MyChart username and password.   

## 2022-04-26 NOTE — Progress Notes (Signed)
Patient tolerated iron infusion with no complaints voiced. Port site clean and dry with good blood return noted before and after infusion. Band aid applied. VSS with discharge and left in satisfactory condition with no s/s of distress noted.   °

## 2022-04-27 ENCOUNTER — Emergency Department (HOSPITAL_COMMUNITY): Payer: HMO

## 2022-04-27 ENCOUNTER — Inpatient Hospital Stay (HOSPITAL_COMMUNITY)
Admission: EM | Admit: 2022-04-27 | Discharge: 2022-05-01 | DRG: 522 | Disposition: A | Discharge status: Skilled Nursing Facility | Payer: HMO | Attending: Internal Medicine | Admitting: Internal Medicine

## 2022-04-27 ENCOUNTER — Other Ambulatory Visit: Payer: Self-pay

## 2022-04-27 DIAGNOSIS — Z9221 Personal history of antineoplastic chemotherapy: Secondary | ICD-10-CM

## 2022-04-27 DIAGNOSIS — D62 Acute posthemorrhagic anemia: Secondary | ICD-10-CM | POA: Diagnosis not present

## 2022-04-27 DIAGNOSIS — Y92008 Other place in unspecified non-institutional (private) residence as the place of occurrence of the external cause: Secondary | ICD-10-CM

## 2022-04-27 DIAGNOSIS — I10 Essential (primary) hypertension: Secondary | ICD-10-CM | POA: Diagnosis present

## 2022-04-27 DIAGNOSIS — I5042 Chronic combined systolic (congestive) and diastolic (congestive) heart failure: Secondary | ICD-10-CM | POA: Diagnosis present

## 2022-04-27 DIAGNOSIS — S72001A Fracture of unspecified part of neck of right femur, initial encounter for closed fracture: Principal | ICD-10-CM | POA: Diagnosis present

## 2022-04-27 DIAGNOSIS — Z8249 Family history of ischemic heart disease and other diseases of the circulatory system: Secondary | ICD-10-CM

## 2022-04-27 DIAGNOSIS — S72009A Fracture of unspecified part of neck of unspecified femur, initial encounter for closed fracture: Secondary | ICD-10-CM | POA: Diagnosis present

## 2022-04-27 DIAGNOSIS — D649 Anemia, unspecified: Secondary | ICD-10-CM

## 2022-04-27 DIAGNOSIS — I2510 Atherosclerotic heart disease of native coronary artery without angina pectoris: Secondary | ICD-10-CM | POA: Diagnosis present

## 2022-04-27 DIAGNOSIS — Z7901 Long term (current) use of anticoagulants: Secondary | ICD-10-CM

## 2022-04-27 DIAGNOSIS — I110 Hypertensive heart disease with heart failure: Secondary | ICD-10-CM | POA: Diagnosis present

## 2022-04-27 DIAGNOSIS — N179 Acute kidney failure, unspecified: Secondary | ICD-10-CM | POA: Diagnosis present

## 2022-04-27 DIAGNOSIS — Z8673 Personal history of transient ischemic attack (TIA), and cerebral infarction without residual deficits: Secondary | ICD-10-CM

## 2022-04-27 DIAGNOSIS — I480 Paroxysmal atrial fibrillation: Secondary | ICD-10-CM | POA: Diagnosis present

## 2022-04-27 DIAGNOSIS — J449 Chronic obstructive pulmonary disease, unspecified: Secondary | ICD-10-CM | POA: Diagnosis present

## 2022-04-27 DIAGNOSIS — I252 Old myocardial infarction: Secondary | ICD-10-CM

## 2022-04-27 DIAGNOSIS — E785 Hyperlipidemia, unspecified: Secondary | ICD-10-CM | POA: Diagnosis present

## 2022-04-27 DIAGNOSIS — D696 Thrombocytopenia, unspecified: Secondary | ICD-10-CM | POA: Diagnosis present

## 2022-04-27 DIAGNOSIS — Z83438 Family history of other disorder of lipoprotein metabolism and other lipidemia: Secondary | ICD-10-CM

## 2022-04-27 DIAGNOSIS — F17210 Nicotine dependence, cigarettes, uncomplicated: Secondary | ICD-10-CM | POA: Diagnosis present

## 2022-04-27 DIAGNOSIS — C3411 Malignant neoplasm of upper lobe, right bronchus or lung: Secondary | ICD-10-CM | POA: Diagnosis present

## 2022-04-27 DIAGNOSIS — Y9301 Activity, walking, marching and hiking: Secondary | ICD-10-CM | POA: Diagnosis present

## 2022-04-27 DIAGNOSIS — W010XXA Fall on same level from slipping, tripping and stumbling without subsequent striking against object, initial encounter: Secondary | ICD-10-CM | POA: Diagnosis present

## 2022-04-27 LAB — CBC WITH DIFFERENTIAL/PLATELET
Abs Immature Granulocytes: 0.03 10*3/uL (ref 0.00–0.07)
Basophils Absolute: 0.1 10*3/uL (ref 0.0–0.1)
Basophils Relative: 1 %
Eosinophils Absolute: 0.3 10*3/uL (ref 0.0–0.5)
Eosinophils Relative: 4 %
HCT: 36 % — ABNORMAL LOW (ref 39.0–52.0)
Hemoglobin: 11.4 g/dL — ABNORMAL LOW (ref 13.0–17.0)
Immature Granulocytes: 0 %
Lymphocytes Relative: 20 %
Lymphs Abs: 1.6 10*3/uL (ref 0.7–4.0)
MCH: 30.4 pg (ref 26.0–34.0)
MCHC: 31.7 g/dL (ref 30.0–36.0)
MCV: 96 fL (ref 80.0–100.0)
Monocytes Absolute: 0.6 10*3/uL (ref 0.1–1.0)
Monocytes Relative: 8 %
Neutro Abs: 5.4 10*3/uL (ref 1.7–7.7)
Neutrophils Relative %: 67 %
Platelets: 131 10*3/uL — ABNORMAL LOW (ref 150–400)
RBC: 3.75 MIL/uL — ABNORMAL LOW (ref 4.22–5.81)
RDW: 13.7 % (ref 11.5–15.5)
WBC: 8 10*3/uL (ref 4.0–10.5)
nRBC: 0 % (ref 0.0–0.2)

## 2022-04-27 LAB — BASIC METABOLIC PANEL
Anion gap: 13 (ref 5–15)
BUN: 14 mg/dL (ref 8–23)
CO2: 24 mmol/L (ref 22–32)
Calcium: 9.3 mg/dL (ref 8.9–10.3)
Chloride: 104 mmol/L (ref 98–111)
Creatinine, Ser: 1.31 mg/dL — ABNORMAL HIGH (ref 0.61–1.24)
GFR, Estimated: 57 mL/min — ABNORMAL LOW (ref >60)
Glucose, Bld: 99 mg/dL (ref 70–99)
Potassium: 4.3 mmol/L (ref 3.5–5.1)
Sodium: 141 mmol/L (ref 135–145)

## 2022-04-27 LAB — PROTIME-INR
INR: 1.3 — ABNORMAL HIGH (ref 0.8–1.2)
Prothrombin Time: 15.7 seconds — ABNORMAL HIGH (ref 11.4–15.2)

## 2022-04-27 LAB — TYPE AND SCREEN
ABO/RH(D): O NEG
Antibody Screen: NEGATIVE

## 2022-04-27 LAB — CBG MONITORING, ED: Glucose-Capillary: 94 mg/dL (ref 70–99)

## 2022-04-27 MED ORDER — HYDROMORPHONE HCL 1 MG/ML IJ SOLN
0.5000 mg | INTRAMUSCULAR | Status: DC | PRN
  Administered 2022-04-27: 0.5 mg via INTRAVENOUS
  Filled 2022-04-27: qty 1

## 2022-04-27 MED ORDER — SODIUM CHLORIDE 0.9 % IV BOLUS
500.0000 mL | Freq: Once | INTRAVENOUS | Status: AC
  Administered 2022-04-27: 500 mL via INTRAVENOUS

## 2022-04-27 MED ORDER — ONDANSETRON HCL 4 MG/2ML IJ SOLN
4.0000 mg | Freq: Once | INTRAMUSCULAR | Status: AC
  Administered 2022-04-27: 4 mg via INTRAVENOUS
  Filled 2022-04-27: qty 2

## 2022-04-27 MED ORDER — SODIUM CHLORIDE 0.9 % IV SOLN: INTRAVENOUS | Status: DC

## 2022-04-27 NOTE — ED Triage Notes (Signed)
Pt to ED for mechanical trip and fall, coming from home. Right leg and hip pain. Shortening present. Denies LOC, negative head strike. fentanyl given PTA. +thinners. AAOx4  98%RA 140 SBP 112 bgl

## 2022-04-27 NOTE — ED Provider Notes (Signed)
[facility] EMERGENCY DEPARTMENT AT [facility] Provider Note   CSN: [id number] Arrival date & time: 04/27/22  2129     History {Add pertinent medical, surgical, social history, OB history to HPI:1} Chief Complaint  Patient presents with   Darryl Ward is a 77 y.o. male.   Fall     Patient has a history of prior stroke, coronary artery disease, NSTEMI, COPD, iron deficiency anemia, lung cancer undergoing chemotherapy.  Patient has been feeling weak since starting his treatments.  Today he was walking stumbled and fell.  He denies any syncopal episode.  Patient experienced pain in his right hip and was unable to stand.  EMS was called  Home Medications Prior to Admission medications   Medication Sig Start Date End Date Taking? Authorizing Provider  acetaminophen (TYLENOL) 500 MG tablet Take 2 tablets (1,000 mg total) by mouth every 6 (six) hours as needed. Patient taking differently: Take 1,000 mg by mouth 2 (two) times daily as needed for moderate pain. 11/28/21   Barrett, Erin R, PA-C  albuterol (VENTOLIN HFA) 108 (90 Base) MCG/ACT inhaler Inhale 2 puffs into the lungs every 6 (six) hours as needed for wheezing or shortness of breath. 01/23/22   Leslye Peer, MD  apixaban (ELIQUIS) 5 MG TABS tablet Take 1 tablet (5 mg total) by mouth 2 (two) times daily. 04/22/22   [name], MD  atorvastatin (LIPITOR) 80 MG tablet Take 80 mg by mouth daily.    [provider]  cyclobenzaprine (FLEXERIL) 10 MG tablet Take 1 tablet (10 mg total) by mouth 2 (two) times daily as needed. 04/05/21     escitalopram (LEXAPRO) 10 MG tablet Take 1 tablet (10 mg total) by mouth daily. 03/11/22   [name], MD  folic acid (FOLVITE) 1 MG tablet Take 1 tablet (1 mg total) by mouth daily. 02/12/22   [name], MD  furosemide (LASIX) 20 MG tablet Take 1 tablet (20 mg total) by mouth daily as needed. Patient taking differently: Take 20 mg by mouth in the  morning. 10/17/21   [name], MD  lidocaine-prilocaine (EMLA) cream Apply a small amount to port a cath site and cover with plastic wrap 1 hour prior to infusion appointments 08/02/21   [name], MD  Magnesium Oxide 400 MG CAPS Take 1 capsule (400 mg total) by mouth daily. 12/27/21   Sharlene Dory, PA-C  metoprolol succinate (TOPROL-XL) 50 MG 24 hr tablet Take 1.5 tablets (75 mg total) by mouth daily. Take with or immediately following a meal. 12/27/21   Sharlene Dory, PA-C  prochlorperazine (COMPAZINE) 10 MG tablet Take 1 tablet (10 mg total) by mouth every 6 (six) hours as needed for nausea/vomiting 08/02/21   [name], MD  Tiotropium Bromide Monohydrate (SPIRIVA RESPIMAT) 2.5 MCG/ACT AERS Inhale 2 puffs into the lungs daily. 01/23/22   Leslye Peer, MD  traMADol (ULTRAM) 50 MG tablet Take 1 tablet (50 mg total) by mouth every 6 (six) hours as needed (mild pain). 11/28/21   Barrett, Rae Roam, PA-C      Allergies    Patient has no known allergies.    Review of Systems   Review of Systems  Physical Exam Updated Vital Signs BP 133/62 (BP Location: Right Arm)   Pulse (!) 49   Temp 97.6 F (36.4 C) (Oral)   Resp 16   Ht 1.778 m (5\' 10" )   Wt 79.4 kg   SpO2 96%  BMI 25.11 kg/m  Physical Exam Vitals and nursing note reviewed.  Constitutional:      Appearance: He is well-developed. He is ill-appearing.  HENT:     Head: Normocephalic and atraumatic.     Right Ear: External ear normal.     Left Ear: External ear normal.  Eyes:     General: No scleral icterus.       Right eye: No discharge.        Left eye: No discharge.     Conjunctiva/sclera: Conjunctivae normal.  Neck:     Trachea: No tracheal deviation.  Cardiovascular:     Rate and Rhythm: Normal rate and regular rhythm.  Pulmonary:     Effort: Pulmonary effort is normal. No respiratory distress.     Breath sounds: Normal breath sounds. No stridor. No wheezing or rales.  Abdominal:      General: Bowel sounds are normal. There is no distension.     Palpations: Abdomen is soft.     Tenderness: There is no abdominal tenderness. There is no guarding or rebound.  Musculoskeletal:        General: Tenderness present. No deformity.     Cervical back: Normal and neck supple.     Thoracic back: Normal.     Lumbar back: Normal.     Right hip: Tenderness present. Decreased range of motion.  Skin:    General: Skin is warm and dry.     Findings: No rash.  Neurological:     General: No focal deficit present.     Mental Status: He is alert.     Cranial Nerves: No cranial nerve deficit, dysarthria or facial asymmetry.     Sensory: No sensory deficit.     Motor: No abnormal muscle tone or seizure activity.     Coordination: Coordination normal.  Psychiatric:        Mood and Affect: Mood normal.     ED Results / Procedures / Treatments   Labs (all labs ordered are listed, but only abnormal results are displayed) Labs Reviewed  BASIC METABOLIC PANEL  CBC WITH DIFFERENTIAL/PLATELET  PROTIME-INR  CBG MONITORING, ED  TYPE AND SCREEN    EKG None  Radiology No results found.  Procedures Procedures  {Document cardiac monitor, telemetry assessment procedure when appropriate:1}  Medications Ordered in ED Medications  sodium chloride 0.9 % bolus 500 mL (has no administration in time range)    And  0.9 %  sodium chloride infusion (has no administration in time range)  HYDROmorphone (DILAUDID) injection 0.5 mg (has no administration in time range)  ondansetron (ZOFRAN) injection 4 mg (has no administration in time range)    ED Course/ Medical Decision Making/ A&P   {   Click here for ABCD2, HEART and other calculatorsREFRESH Note before signing :1}                          Medical Decision Making Amount and/or Complexity of Data Reviewed Labs: ordered. Radiology: ordered.  Risk Prescription drug management.   ***  {Document critical care time when  appropriate:1} {Document review of labs and clinical decision tools ie heart score, Chads2Vasc2 etc:1}  {Document your independent review of radiology images, and any outside records:1} {Document your discussion with family members, caretakers, and with consultants:1} {Document social determinants of health affecting pt's care:1} {Document your decision making why or why not admission, treatments were needed:1} Final Clinical Impression(s) / ED Diagnoses Final diagnoses:  None  Rx / DC Orders ED Discharge Orders     None

## 2022-04-28 ENCOUNTER — Encounter (HOSPITAL_COMMUNITY): Payer: Self-pay | Admitting: Family Medicine

## 2022-04-28 ENCOUNTER — Encounter (HOSPITAL_COMMUNITY)
Admission: EM | Disposition: A | Discharge status: Skilled Nursing Facility | Payer: Self-pay | Source: Home / Self Care | Attending: Internal Medicine

## 2022-04-28 ENCOUNTER — Inpatient Hospital Stay (HOSPITAL_COMMUNITY): Payer: HMO | Admitting: Certified Registered Nurse Anesthetist

## 2022-04-28 ENCOUNTER — Other Ambulatory Visit: Payer: Self-pay

## 2022-04-28 ENCOUNTER — Inpatient Hospital Stay (HOSPITAL_COMMUNITY): Payer: HMO

## 2022-04-28 DIAGNOSIS — Y9301 Activity, walking, marching and hiking: Secondary | ICD-10-CM | POA: Diagnosis present

## 2022-04-28 DIAGNOSIS — Z8249 Family history of ischemic heart disease and other diseases of the circulatory system: Secondary | ICD-10-CM | POA: Diagnosis not present

## 2022-04-28 DIAGNOSIS — S72001A Fracture of unspecified part of neck of right femur, initial encounter for closed fracture: Secondary | ICD-10-CM

## 2022-04-28 DIAGNOSIS — I110 Hypertensive heart disease with heart failure: Secondary | ICD-10-CM

## 2022-04-28 DIAGNOSIS — R4182 Altered mental status, unspecified: Secondary | ICD-10-CM | POA: Diagnosis present

## 2022-04-28 DIAGNOSIS — D62 Acute posthemorrhagic anemia: Secondary | ICD-10-CM | POA: Diagnosis not present

## 2022-04-28 DIAGNOSIS — Z79899 Other long term (current) drug therapy: Secondary | ICD-10-CM | POA: Diagnosis not present

## 2022-04-28 DIAGNOSIS — Z1152 Encounter for screening for COVID-19: Secondary | ICD-10-CM | POA: Diagnosis not present

## 2022-04-28 DIAGNOSIS — Z87891 Personal history of nicotine dependence: Secondary | ICD-10-CM

## 2022-04-28 DIAGNOSIS — E785 Hyperlipidemia, unspecified: Secondary | ICD-10-CM | POA: Diagnosis present

## 2022-04-28 DIAGNOSIS — I252 Old myocardial infarction: Secondary | ICD-10-CM

## 2022-04-28 DIAGNOSIS — D649 Anemia, unspecified: Secondary | ICD-10-CM

## 2022-04-28 DIAGNOSIS — D696 Thrombocytopenia, unspecified: Secondary | ICD-10-CM | POA: Diagnosis present

## 2022-04-28 DIAGNOSIS — Z9221 Personal history of antineoplastic chemotherapy: Secondary | ICD-10-CM | POA: Diagnosis not present

## 2022-04-28 DIAGNOSIS — N179 Acute kidney failure, unspecified: Secondary | ICD-10-CM | POA: Diagnosis present

## 2022-04-28 DIAGNOSIS — C3411 Malignant neoplasm of upper lobe, right bronchus or lung: Secondary | ICD-10-CM | POA: Diagnosis present

## 2022-04-28 DIAGNOSIS — Y92008 Other place in unspecified non-institutional (private) residence as the place of occurrence of the external cause: Secondary | ICD-10-CM | POA: Diagnosis not present

## 2022-04-28 DIAGNOSIS — Z7901 Long term (current) use of anticoagulants: Secondary | ICD-10-CM | POA: Diagnosis not present

## 2022-04-28 DIAGNOSIS — I480 Paroxysmal atrial fibrillation: Secondary | ICD-10-CM | POA: Diagnosis present

## 2022-04-28 DIAGNOSIS — W010XXA Fall on same level from slipping, tripping and stumbling without subsequent striking against object, initial encounter: Secondary | ICD-10-CM | POA: Diagnosis present

## 2022-04-28 DIAGNOSIS — Z8673 Personal history of transient ischemic attack (TIA), and cerebral infarction without residual deficits: Secondary | ICD-10-CM | POA: Diagnosis not present

## 2022-04-28 DIAGNOSIS — I5042 Chronic combined systolic (congestive) and diastolic (congestive) heart failure: Secondary | ICD-10-CM | POA: Diagnosis present

## 2022-04-28 DIAGNOSIS — G9340 Encephalopathy, unspecified: Secondary | ICD-10-CM | POA: Diagnosis not present

## 2022-04-28 DIAGNOSIS — S72009A Fracture of unspecified part of neck of unspecified femur, initial encounter for closed fracture: Secondary | ICD-10-CM | POA: Diagnosis present

## 2022-04-28 DIAGNOSIS — F17210 Nicotine dependence, cigarettes, uncomplicated: Secondary | ICD-10-CM | POA: Diagnosis present

## 2022-04-28 DIAGNOSIS — Z83438 Family history of other disorder of lipoprotein metabolism and other lipidemia: Secondary | ICD-10-CM | POA: Diagnosis not present

## 2022-04-28 DIAGNOSIS — I2510 Atherosclerotic heart disease of native coronary artery without angina pectoris: Secondary | ICD-10-CM | POA: Diagnosis not present

## 2022-04-28 DIAGNOSIS — I509 Heart failure, unspecified: Secondary | ICD-10-CM

## 2022-04-28 DIAGNOSIS — J449 Chronic obstructive pulmonary disease, unspecified: Secondary | ICD-10-CM | POA: Diagnosis present

## 2022-04-28 HISTORY — PX: HIP ARTHROPLASTY: SHX981

## 2022-04-28 LAB — MRSA NEXT GEN BY PCR, NASAL: MRSA by PCR Next Gen: NOT DETECTED

## 2022-04-28 SURGERY — HEMIARTHROPLASTY, HIP, DIRECT ANTERIOR APPROACH, FOR FRACTURE
Anesthesia: General | Site: Hip | Laterality: Right

## 2022-04-28 MED ORDER — METOCLOPRAMIDE HCL 5 MG PO TABS: 5.0000 mg | ORAL_TABLET | Freq: Three times a day (TID) | ORAL | Status: DC | PRN

## 2022-04-28 MED ORDER — DEXAMETHASONE SODIUM PHOSPHATE 10 MG/ML IJ SOLN
INTRAMUSCULAR | Status: AC
  Filled 2022-04-28: qty 1

## 2022-04-28 MED ORDER — FOLIC ACID 1 MG PO TABS
1.0000 mg | ORAL_TABLET | Freq: Every day | ORAL | Status: DC
  Administered 2022-04-29 – 2022-05-01 (×3): 1 mg via ORAL
  Filled 2022-04-28 (×3): qty 1

## 2022-04-28 MED ORDER — CYCLOBENZAPRINE HCL 10 MG PO TABS: 10.0000 mg | ORAL_TABLET | Freq: Two times a day (BID) | ORAL | Status: DC | PRN

## 2022-04-28 MED ORDER — PHENOL 1.4 % MT LIQD: 1.0000 | OROMUCOSAL | Status: DC | PRN

## 2022-04-28 MED ORDER — PANTOPRAZOLE SODIUM 40 MG PO TBEC
40.0000 mg | DELAYED_RELEASE_TABLET | Freq: Every day | ORAL | Status: DC
  Administered 2022-04-28 – 2022-05-01 (×4): 40 mg via ORAL
  Filled 2022-04-28 (×4): qty 1

## 2022-04-28 MED ORDER — DOCUSATE SODIUM 100 MG PO CAPS
100.0000 mg | ORAL_CAPSULE | Freq: Two times a day (BID) | ORAL | Status: DC
  Administered 2022-04-28 – 2022-05-01 (×6): 100 mg via ORAL
  Filled 2022-04-28 (×6): qty 1

## 2022-04-28 MED ORDER — CEFAZOLIN SODIUM-DEXTROSE 2-4 GM/100ML-% IV SOLN
2.0000 g | INTRAVENOUS | Status: AC
  Administered 2022-04-28: 2 g via INTRAVENOUS

## 2022-04-28 MED ORDER — DEXAMETHASONE SODIUM PHOSPHATE 10 MG/ML IJ SOLN: 8.0000 mg | Freq: Once | INTRAMUSCULAR | Status: DC

## 2022-04-28 MED ORDER — FENTANYL CITRATE (PF) 100 MCG/2ML IJ SOLN: 25.0000 ug | INTRAMUSCULAR | Status: DC | PRN

## 2022-04-28 MED ORDER — ALUM & MAG HYDROXIDE-SIMETH 200-200-20 MG/5ML PO SUSP: 30.0000 mL | ORAL | Status: DC | PRN

## 2022-04-28 MED ORDER — ESCITALOPRAM OXALATE 10 MG PO TABS
10.0000 mg | ORAL_TABLET | Freq: Every day | ORAL | Status: DC
  Administered 2022-04-29 – 2022-05-01 (×3): 10 mg via ORAL
  Filled 2022-04-28 (×3): qty 1

## 2022-04-28 MED ORDER — MORPHINE SULFATE (PF) 2 MG/ML IV SOLN
0.5000 mg | INTRAVENOUS | Status: DC | PRN
  Administered 2022-04-28 (×2): 0.5 mg via INTRAVENOUS
  Filled 2022-04-28 (×2): qty 1

## 2022-04-28 MED ORDER — ONDANSETRON HCL 4 MG PO TABS: 4.0000 mg | ORAL_TABLET | Freq: Four times a day (QID) | ORAL | Status: DC | PRN

## 2022-04-28 MED ORDER — POVIDONE-IODINE 10 % EX SWAB
2.0000 | Freq: Once | CUTANEOUS | Status: AC
  Administered 2022-04-28: 2 via TOPICAL

## 2022-04-28 MED ORDER — ATORVASTATIN CALCIUM 80 MG PO TABS
80.0000 mg | ORAL_TABLET | Freq: Every day | ORAL | Status: DC
  Administered 2022-04-29 – 2022-05-01 (×3): 80 mg via ORAL
  Filled 2022-04-28 (×3): qty 1

## 2022-04-28 MED ORDER — TRANEXAMIC ACID-NACL 1000-0.7 MG/100ML-% IV SOLN
INTRAVENOUS | Status: AC
  Filled 2022-04-28: qty 100

## 2022-04-28 MED ORDER — HYDROCODONE-ACETAMINOPHEN 5-325 MG PO TABS
1.0000 | ORAL_TABLET | ORAL | Status: DC | PRN
  Administered 2022-04-29 – 2022-05-01 (×2): 2 via ORAL
  Administered 2022-05-01: 1 via ORAL
  Filled 2022-04-28 (×3): qty 2

## 2022-04-28 MED ORDER — HYDROCODONE-ACETAMINOPHEN 7.5-325 MG PO TABS
1.0000 | ORAL_TABLET | ORAL | Status: DC | PRN
  Administered 2022-04-28 – 2022-04-30 (×3): 1 via ORAL
  Filled 2022-04-28 (×3): qty 1

## 2022-04-28 MED ORDER — PROPOFOL 10 MG/ML IV BOLUS
INTRAVENOUS | Status: AC
  Filled 2022-04-28: qty 20

## 2022-04-28 MED ORDER — MAGNESIUM OXIDE -MG SUPPLEMENT 400 (240 MG) MG PO TABS
400.0000 mg | ORAL_TABLET | Freq: Every day | ORAL | Status: DC
  Administered 2022-04-29 – 2022-05-01 (×3): 400 mg via ORAL
  Filled 2022-04-28 (×3): qty 1

## 2022-04-28 MED ORDER — SUGAMMADEX SODIUM 200 MG/2ML IV SOLN
INTRAVENOUS | Status: DC | PRN
  Administered 2022-04-28: 200 mg via INTRAVENOUS

## 2022-04-28 MED ORDER — HYDROCODONE-ACETAMINOPHEN 5-325 MG PO TABS: 1.0000 | ORAL_TABLET | Freq: Four times a day (QID) | ORAL | Status: DC | PRN

## 2022-04-28 MED ORDER — DEXAMETHASONE SODIUM PHOSPHATE 10 MG/ML IJ SOLN
INTRAMUSCULAR | Status: DC | PRN
  Administered 2022-04-28: 10 mg via INTRAVENOUS

## 2022-04-28 MED ORDER — FENTANYL CITRATE (PF) 250 MCG/5ML IJ SOLN
INTRAMUSCULAR | Status: DC | PRN
  Administered 2022-04-28: 50 ug via INTRAVENOUS
  Administered 2022-04-28: 100 ug via INTRAVENOUS
  Administered 2022-04-28 (×2): 50 ug via INTRAVENOUS

## 2022-04-28 MED ORDER — MENTHOL 3 MG MT LOZG: 1.0000 | LOZENGE | OROMUCOSAL | Status: DC | PRN

## 2022-04-28 MED ORDER — TRANEXAMIC ACID-NACL 1000-0.7 MG/100ML-% IV SOLN
1000.0000 mg | INTRAVENOUS | Status: AC
  Administered 2022-04-28: 1000 mg via INTRAVENOUS

## 2022-04-28 MED ORDER — METHOCARBAMOL 500 MG PO TABS
500.0000 mg | ORAL_TABLET | Freq: Four times a day (QID) | ORAL | Status: DC | PRN
  Administered 2022-04-30 – 2022-05-01 (×3): 500 mg via ORAL
  Filled 2022-04-28 (×3): qty 1

## 2022-04-28 MED ORDER — TRAMADOL HCL 50 MG PO TABS: 50.0000 mg | ORAL_TABLET | Freq: Four times a day (QID) | ORAL | Status: DC | PRN

## 2022-04-28 MED ORDER — METOCLOPRAMIDE HCL 5 MG/ML IJ SOLN: 5.0000 mg | Freq: Three times a day (TID) | INTRAMUSCULAR | Status: DC | PRN

## 2022-04-28 MED ORDER — PHENYLEPHRINE HCL-NACL 20-0.9 MG/250ML-% IV SOLN
INTRAVENOUS | Status: DC | PRN
  Administered 2022-04-28: 40 ug/min via INTRAVENOUS

## 2022-04-28 MED ORDER — EPHEDRINE SULFATE-NACL 50-0.9 MG/10ML-% IV SOSY
PREFILLED_SYRINGE | INTRAVENOUS | Status: DC | PRN
  Administered 2022-04-28 (×2): 10 mg via INTRAVENOUS

## 2022-04-28 MED ORDER — ACETAMINOPHEN 500 MG PO TABS
500.0000 mg | ORAL_TABLET | Freq: Four times a day (QID) | ORAL | Status: AC
  Administered 2022-04-29 (×2): 500 mg via ORAL
  Filled 2022-04-28 (×3): qty 1

## 2022-04-28 MED ORDER — MORPHINE SULFATE (PF) 2 MG/ML IV SOLN
0.5000 mg | INTRAVENOUS | Status: DC | PRN
  Administered 2022-04-29: 0.5 mg via INTRAVENOUS
  Filled 2022-04-28: qty 1

## 2022-04-28 MED ORDER — ROCURONIUM BROMIDE 10 MG/ML (PF) SYRINGE
PREFILLED_SYRINGE | INTRAVENOUS | Status: DC | PRN
  Administered 2022-04-28: 50 mg via INTRAVENOUS

## 2022-04-28 MED ORDER — MIDAZOLAM HCL 2 MG/2ML IJ SOLN
INTRAMUSCULAR | Status: AC
  Filled 2022-04-28: qty 2

## 2022-04-28 MED ORDER — LIDOCAINE 2% (20 MG/ML) 5 ML SYRINGE
INTRAMUSCULAR | Status: DC | PRN
  Administered 2022-04-28: 60 mg via INTRAVENOUS

## 2022-04-28 MED ORDER — CEFAZOLIN SODIUM-DEXTROSE 2-4 GM/100ML-% IV SOLN
INTRAVENOUS | Status: AC
  Filled 2022-04-28: qty 100

## 2022-04-28 MED ORDER — TRANEXAMIC ACID-NACL 1000-0.7 MG/100ML-% IV SOLN
1000.0000 mg | Freq: Once | INTRAVENOUS | Status: AC
  Administered 2022-04-28: 1000 mg via INTRAVENOUS
  Filled 2022-04-28: qty 100

## 2022-04-28 MED ORDER — CEFAZOLIN SODIUM-DEXTROSE 2-4 GM/100ML-% IV SOLN
2.0000 g | Freq: Four times a day (QID) | INTRAVENOUS | Status: AC
  Administered 2022-04-28 (×2): 2 g via INTRAVENOUS
  Filled 2022-04-28 (×2): qty 100

## 2022-04-28 MED ORDER — CHLORHEXIDINE GLUCONATE CLOTH 2 % EX PADS
6.0000 | MEDICATED_PAD | Freq: Every day | CUTANEOUS | Status: DC
  Administered 2022-04-28 – 2022-04-30 (×3): 6 via TOPICAL

## 2022-04-28 MED ORDER — METOPROLOL SUCCINATE ER 50 MG PO TB24
75.0000 mg | ORAL_TABLET | Freq: Every day | ORAL | Status: DC
  Administered 2022-04-29 – 2022-05-01 (×2): 75 mg via ORAL
  Filled 2022-04-28 (×4): qty 1

## 2022-04-28 MED ORDER — UMECLIDINIUM BROMIDE 62.5 MCG/ACT IN AEPB
1.0000 | INHALATION_SPRAY | Freq: Every day | RESPIRATORY_TRACT | Status: DC
  Administered 2022-04-30: 1 via RESPIRATORY_TRACT
  Filled 2022-04-28: qty 7

## 2022-04-28 MED ORDER — BISACODYL 10 MG RE SUPP: 10.0000 mg | Freq: Every day | RECTAL | Status: DC | PRN

## 2022-04-28 MED ORDER — ALBUTEROL SULFATE (2.5 MG/3ML) 0.083% IN NEBU: 2.5000 mg | INHALATION_SOLUTION | Freq: Four times a day (QID) | RESPIRATORY_TRACT | Status: DC | PRN

## 2022-04-28 MED ORDER — 0.9 % SODIUM CHLORIDE (POUR BTL) OPTIME
TOPICAL | Status: DC | PRN
  Administered 2022-04-28: 1000 mL

## 2022-04-28 MED ORDER — ONDANSETRON HCL 4 MG/2ML IJ SOLN
INTRAMUSCULAR | Status: DC | PRN
  Administered 2022-04-28: 4 mg via INTRAVENOUS

## 2022-04-28 MED ORDER — ACETAMINOPHEN 500 MG PO TABS: 1000.0000 mg | ORAL_TABLET | Freq: Once | ORAL | Status: AC

## 2022-04-28 MED ORDER — SODIUM CHLORIDE 0.9 % IV SOLN: INTRAVENOUS | Status: AC

## 2022-04-28 MED ORDER — ACETAMINOPHEN 500 MG PO TABS
ORAL_TABLET | ORAL | Status: AC
  Administered 2022-04-28: 1000 mg via ORAL
  Filled 2022-04-28: qty 2

## 2022-04-28 MED ORDER — FENTANYL CITRATE (PF) 250 MCG/5ML IJ SOLN
INTRAMUSCULAR | Status: AC
  Filled 2022-04-28: qty 5

## 2022-04-28 MED ORDER — PROPOFOL 10 MG/ML IV BOLUS
INTRAVENOUS | Status: DC | PRN
  Administered 2022-04-28: 170 mg via INTRAVENOUS

## 2022-04-28 MED ORDER — ACETAMINOPHEN 325 MG PO TABS
325.0000 mg | ORAL_TABLET | Freq: Four times a day (QID) | ORAL | Status: DC | PRN
  Administered 2022-04-29 – 2022-04-30 (×2): 650 mg via ORAL
  Administered 2022-04-30: 325 mg via ORAL
  Filled 2022-04-28 (×3): qty 2

## 2022-04-28 MED ORDER — POLYETHYLENE GLYCOL 3350 17 G PO PACK: 17.0000 g | PACK | Freq: Every day | ORAL | Status: DC | PRN

## 2022-04-28 MED ORDER — LACTATED RINGERS IV SOLN: INTRAVENOUS | Status: DC | PRN

## 2022-04-28 MED ORDER — ONDANSETRON HCL 4 MG/2ML IJ SOLN
4.0000 mg | Freq: Four times a day (QID) | INTRAMUSCULAR | Status: DC | PRN
  Administered 2022-05-01: 4 mg via INTRAVENOUS
  Filled 2022-04-28: qty 2

## 2022-04-28 MED ORDER — METHOCARBAMOL 1000 MG/10ML IJ SOLN: 500.0000 mg | Freq: Four times a day (QID) | INTRAVENOUS | Status: DC | PRN

## 2022-04-28 SURGICAL SUPPLY — 51 items
APL PRP STRL LF DISP 70% ISPRP (MISCELLANEOUS) ×2
BAG COUNTER SPONGE SURGICOUNT (BAG) IMPLANT
BAG SPNG CNTER NS LX DISP (BAG) ×1
BLADE SAGITTAL 25.0X1.27X90 (BLADE) ×1 IMPLANT
CHLORAPREP W/TINT 26 (MISCELLANEOUS) ×2 IMPLANT
CLSR STERI-STRIP ANTIMIC 1/2X4 (GAUZE/BANDAGES/DRESSINGS) ×1 IMPLANT
COVER SURGICAL LIGHT HANDLE (MISCELLANEOUS) ×1 IMPLANT
DRAPE INCISE IOBAN 66X45 STRL (DRAPES) ×1 IMPLANT
DRAPE ORTHO SPLIT 77X108 STRL (DRAPES) ×3
DRAPE SURG ORHT 6 SPLT 77X108 (DRAPES) ×2 IMPLANT
DRAPE U-SHAPE 47X51 STRL (DRAPES) ×1 IMPLANT
DRSG MEPILEX POST OP 4X8 (GAUZE/BANDAGES/DRESSINGS) IMPLANT
ELECT BLADE 4.0 EZ CLEAN MEGAD (MISCELLANEOUS)
ELECT CAUTERY BLADE 6.4 (BLADE) ×1 IMPLANT
ELECT REM PT RETURN 9FT ADLT (ELECTROSURGICAL) ×1
ELECTRODE BLDE 4.0 EZ CLN MEGD (MISCELLANEOUS) IMPLANT
ELECTRODE REM PT RTRN 9FT ADLT (ELECTROSURGICAL) ×1 IMPLANT
GLOVE BIO SURGEON STRL SZ 6.5 (GLOVE) ×1 IMPLANT
GLOVE BIO SURGEON STRL SZ8 (GLOVE) ×2 IMPLANT
GLOVE BIOGEL PI IND STRL 8 (GLOVE) ×1 IMPLANT
GLOVE INDICATOR 6.5 STRL GRN (GLOVE) ×1 IMPLANT
GOWN STRL REUS W/ TWL LRG LVL3 (GOWN DISPOSABLE) ×2 IMPLANT
GOWN STRL REUS W/ TWL XL LVL3 (GOWN DISPOSABLE) ×2 IMPLANT
GOWN STRL REUS W/TWL 2XL LVL3 (GOWN DISPOSABLE) IMPLANT
GOWN STRL REUS W/TWL LRG LVL3 (GOWN DISPOSABLE) ×2
GOWN STRL REUS W/TWL XL LVL3 (GOWN DISPOSABLE) ×2
HEAD MODULAR ENDO (Orthopedic Implant) ×1 IMPLANT
HEAD UNPLR 53XMDLR STRL HIP (Orthopedic Implant) IMPLANT
KIT BASIN OR (CUSTOM PROCEDURE TRAY) ×1 IMPLANT
KIT TURNOVER KIT B (KITS) ×1 IMPLANT
MANIFOLD NEPTUNE II (INSTRUMENTS) ×1 IMPLANT
NDL MAYO TROCAR (NEEDLE) IMPLANT
NEEDLE MAYO TROCAR (NEEDLE) IMPLANT
PACK TOTAL JOINT (CUSTOM PROCEDURE TRAY) ×1 IMPLANT
PAD ARMBOARD 7.5X6 YLW CONV (MISCELLANEOUS) ×2 IMPLANT
PILLOW ABDUCTION MEDIUM (MISCELLANEOUS) ×1 IMPLANT
RETRIEVER SUT HEWSON (MISCELLANEOUS) ×1 IMPLANT
SLEEVE UNITRAX (Orthopedic Implant) IMPLANT
STAPLER VISISTAT 35W (STAPLE) IMPLANT
STEM HIP 127 DEG (Stem) IMPLANT
SUT FIBERWIRE #2 38 REV NDL BL (SUTURE) ×2
SUT MON AB 3-0 SH 27 (SUTURE)
SUT MON AB 3-0 SH27 (SUTURE) IMPLANT
SUT VIC AB 0 CT1 27 (SUTURE) ×2
SUT VIC AB 0 CT1 27XBRD ANBCTR (SUTURE) ×2 IMPLANT
SUT VIC AB 2-0 CT1 27 (SUTURE) ×2
SUT VIC AB 2-0 CT1 TAPERPNT 27 (SUTURE) ×2 IMPLANT
SUT VIC AB 2-0 SH 27 (SUTURE)
SUT VIC AB 2-0 SH 27XBRD (SUTURE) IMPLANT
SUTURE FIBERWR#2 38 REV NDL BL (SUTURE) ×2 IMPLANT
TRAY FOLEY W/BAG SLVR 14FR (SET/KITS/TRAYS/PACK) ×1 IMPLANT

## 2022-04-28 NOTE — Assessment & Plan Note (Addendum)
-   Right hip x-ray revealing right femoral neck fracture with impaction and angulation following mechanical fall - Orthopedic Dr. [name] will see in consultation in the morning. Keep NPO past midnight -PRN IV opioid for pain

## 2022-04-28 NOTE — Assessment & Plan Note (Signed)
-  has underwent 3 cycles of pembrolizumab. Last cycle on 04/22/22. -follows with Dr. Ellin Saba with [facility]

## 2022-04-28 NOTE — Anesthesia Procedure Notes (Signed)
Procedure Name: Intubation Date/Time: 04/28/2022 10:53 AM  Performed by: Randon Goldsmith, CRNAPre-anesthesia Checklist: Patient identified, Emergency Drugs available, Suction available and Patient being monitored Patient Re-evaluated:Patient Re-evaluated prior to induction Oxygen Delivery Method: Circle system utilized Preoxygenation: Pre-oxygenation with 100% oxygen Induction Type: IV induction Ventilation: Mask ventilation without difficulty Laryngoscope Size: Mac and 4 Grade View: Grade I Tube type: Oral Tube size: 7.5 mm Number of attempts: 1 Airway Equipment and Method: Stylet and Oral airway Placement Confirmation: ETT inserted through vocal cords under direct vision, positive ETCO2 and breath sounds checked- equal and bilateral Secured at: 22 cm Tube secured with: Tape Dental Injury: Teeth and Oropharynx as per pre-operative assessment

## 2022-04-28 NOTE — H&P (Signed)
History and Physical    Patient: Darryl Ward YQI:[id number] DOB: 1945/06/23 DOA: 04/27/2022 DOS: the patient was seen and examined on 04/28/2022 PCP: [name], DO  Patient coming from: Home  Chief Complaint:  Chief Complaint  Patient presents with   Fall   HPI: Darryl Ward is a 77 y.o. 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   Pt recently had immunotherapy on 04/22/22 and usually gets really weak. He was walking out to porch today and tripped over some tiles and fell. Has right hip pain. He is on Eliquis with last dose this morning (4/6). Denies any dizziness. No chest pain or palpitations.   In the ED, he was afebrile, bradycardic with heart rate in the 50s and normotensive on room air.  No leukocytosis, hemoglobin 11.4 with baseline around 10.  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  Sodium of 141, K of 4.5, creatinine of 1.31 up from prior of 0.98.  Right hip x-ray revealing right femoral neck fracture with impaction and angulation.  ED physician discussed with orthopedic Dr. [name] who will see in consultation in the morning.  Review of Systems: As mentioned in the history of present illness. All other systems reviewed and are negative. Past Medical History:  Diagnosis Date   AAA (abdominal aortic aneurysm)    Arthritis    Cancer    Carotid artery disease    Nonobstructive   Cataract    COPD (chronic obstructive pulmonary disease)    Coronary atherosclerosis of native coronary artery    PTCA small diagonal 2007 otherwise nonobstructive CAD   Depression    Dysrhythmia    Essential hypertension, benign    Hyperlipidemia    NSTEMI (non-ST elevated myocardial infarction) 2007   Stroke 2004   TIA (transient ischemic attack) 2006   Past Surgical  History:  Procedure Laterality Date   AORTA - BILATERAL FEMORAL ARTERY BYPASS GRAFT  01/08/2012   Procedure: AORTA BIFEMORAL BYPASS GRAFT;  Surgeon: Sherren Kerns, MD;  [facility];  Service: Vascular;  Laterality: Bilateral;  using 18x7mm x 40cm Hemashield Gold Vascular Graft with Endarterectomy, Thombectomy and  Reimplantation of Inferior Mesenteric Artery   BACK SURGERY  2021   BRONCHIAL BIOPSY  06/11/2021   Procedure: BRONCHIAL BIOPSIES;  Surgeon: Leslye Peer, MD;  [facility] END[facility];  Service: Pulmonary;;   BRONCHIAL BRUSHINGS  06/11/2021   Procedure: BRONCHIAL BRUSHINGS;  Surgeon: Leslye Peer, MD;  Location: [facility] ENDOSCOPY;  Service: Pulmonary;;   BRONCHIAL NEEDLE ASPIRATION BIOPSY  06/11/2021   Procedure: BRONCHIAL NEEDLE ASPIRATION BIOPSIES;  Surgeon: Leslye Peer, MD;  Location: [facility] ENDOSCOPY;  Service: Pulmonary;;   CARDIOVERSION N/A 09/20/2021   Procedure: CARDIOVERSION;  Surgeon: [name], MD;  Location: [facility] ORS;  Service: Cardiovascular;  Laterality: N/A;   COLONOSCOPY N/A 04/14/2019   Procedure: COLONOSCOPY;  Surgeon: Corbin Ade, MD;  Location: [facility] ENDO [facility];  Service: Endoscopy;  Laterality: N/A;  9:30   COLONOSCOPY WITH PROPOFOL N/A 07/25/2021   Procedure: COLONOSCOPY WITH PROPOFOL;  Surgeon: Lanelle Bal, DO;  [facility];  Service: Endoscopy;  Laterality: N/A;  1:00pm   FIDUCIAL MARKER PLACEMENT  06/11/2021   Procedure: FIDUCIAL MARKER PLACEMENT;  Surgeon: Leslye Peer, MD;  Location: [facility] ENDOSCOPY;  Service: Pulmonary;;   INTERCOSTAL NERVE BLOCK Right 11/12/2021  Procedure: INTERCOSTAL NERVE BLOCK;  Surgeon: Loreli SlotHendrickson, Steven C, MD;  [facility];  Service: Thoracic;  Laterality: Right;   IR IMAGING GUIDED PORT INSERTION  07/31/2021   Left cataract surgery     LYMPH NODE DISSECTION Right 11/12/2021   Procedure: LYMPH NODE DISSECTION;  Surgeon: Loreli SlotHendrickson, Steven C, MD;  [facility];  Service: Thoracic;   Laterality: Right;   POLYPECTOMY  04/14/2019   Procedure: POLYPECTOMY;  Surgeon: Corbin Adeourk, Robert M, MD;  [facility];  Service: Endoscopy;;   POLYPECTOMY  07/25/2021   Procedure: POLYPECTOMY;  Surgeon: Lanelle Balarver, Charles K, DO;  [facility] ENDO [facility];  Service: Endoscopy;;   TEE WITHOUT CARDIOVERSION N/A 09/20/2021   Procedure: TRANSESOPHAGEAL ECHOCARDIOGRAM (TEE);  Surgeon: Jonelle SidleMcDowell, Samuel G, MD;  Location: [facility]S;  Service: Cardiovascular;  Laterality: N/A;   TRANSFORAMINAL LUMBAR INTERBODY FUSION (TLIF) WITH PEDICLE SCREW FIXATION 1 LEVEL N/A 04/27/2020   Procedure: Transforaminal Lumbar Interbody Fusion Lumbar Five-Sacral One;  Surgeon: Bedelia Personhomas, Jonathan G, MD;  [facility];  Service: Neurosurgery;  Laterality: N/A;   VIDEO BRONCHOSCOPY WITH INSERTION OF INTERBRONCHIAL VALVE (IBV) N/A 11/22/2021   Procedure: VIDEO BRONCHOSCOPY WITH INSERTION OF INTERBRONCHIAL VALVE (IBV);  Surgeon: Loreli SlotHendrickson, Steven C, MD;  [facility];  Service: Thoracic;  Laterality: N/A;   VIDEO BRONCHOSCOPY WITH INSERTION OF INTERBRONCHIAL VALVE (IBV) N/A 01/24/2022   Procedure: VIDEO BRONCHOSCOPY WITH REMOVAL OF INTERBRONCHIAL VALVE (IBV);  Surgeon: Loreli SlotHendrickson, Steven C, MD;  [facility];  Service: Thoracic;  Laterality: N/A;   VIDEO BRONCHOSCOPY WITH RADIAL ENDOBRONCHIAL ULTRASOUND  06/11/2021   Procedure: VIDEO BRONCHOSCOPY WITH RADIAL ENDOBRONCHIAL ULTRASOUND;  Surgeon: Leslye PeerByrum, Robert S, MD;  [facility] ENDOSCOPY;  Service: Pulmonary;;   Social History:  reports that he quit smoking about 9 months ago. His smoking use included cigarettes. He has a 40.00 pack-year smoking history. He has never used smokeless tobacco. He reports that he does not drink alcohol and does not use drugs.  No Known Allergies  Family History  Problem Relation Age of Onset   Hyperlipidemia Sister    Heart attack Brother [id number]   Cancer - Colon Neg Hx     Prior to Admission medications   Medication Sig Start Date End  Date Taking? Authorizing Provider  acetaminophen (TYLENOL) 500 MG tablet Take 2 tablets (1,000 mg total) by mouth every 6 (six) hours as needed. Patient taking differently: Take 1,000 mg by mouth 2 (two) times daily as needed for moderate pain. 11/28/21   Barrett, Erin R, PA-C  albuterol (VENTOLIN HFA) 108 (90 Base) MCG/ACT inhaler Inhale 2 puffs into the lungs every 6 (six) hours as needed for wheezing or shortness of breath. 01/23/22   Leslye PeerByrum, Robert S, MD  apixaban (ELIQUIS) 5 MG TABS tablet Take 1 tablet (5 mg total) by mouth 2 (two) times daily. 04/22/22   Doreatha MassedKatragadda, Sreedhar, MD  atorvastatin (LIPITOR) 80 MG tablet Take 80 mg by mouth daily.    [provider]  cyclobenzaprine (FLEXERIL) 10 MG tablet Take 1 tablet (10 mg total) by mouth 2 (two) times daily as needed. 04/05/21     escitalopram (LEXAPRO) 10 MG tablet Take 1 tablet (10 mg total) by mouth daily. 03/11/22   Doreatha MassedKatragadda, Sreedhar, MD  folic acid (FOLVITE) 1 MG tablet Take 1 tablet (1 mg total) by mouth daily. 02/12/22   Doreatha MassedKatragadda, Sreedhar, MD  furosemide (LASIX) 20 MG tablet Take 1 tablet (20 mg total) by mouth daily as needed. Patient taking differently: Take 20 mg by  mouth in the morning. 10/17/21   [name], MD  lidocaine-prilocaine (EMLA) cream Apply a small amount to port a cath site and cover with plastic wrap 1 hour prior to infusion appointments 08/02/21   [name], MD  Magnesium Oxide 400 MG CAPS Take 1 capsule (400 mg total) by mouth daily. 12/27/21   Sharlene Dory, PA-C  metoprolol succinate (TOPROL-XL) 50 MG 24 hr tablet Take 1.5 tablets (75 mg total) by mouth daily. Take with or immediately following a meal. 12/27/21   Sharlene Dory, PA-C  prochlorperazine (COMPAZINE) 10 MG tablet Take 1 tablet (10 mg total) by mouth every 6 (six) hours as needed for nausea/vomiting 08/02/21   [name], MD  Tiotropium Bromide Monohydrate (SPIRIVA RESPIMAT) 2.5 MCG/ACT AERS Inhale 2 puffs into the lungs  daily. 01/23/22   Leslye Peer, MD  traMADol (ULTRAM) 50 MG tablet Take 1 tablet (50 mg total) by mouth every 6 (six) hours as needed (mild pain). 11/28/21   Barrett, Rae Roam, PA-C    Physical Exam: Vitals:   04/27/22 2230 04/27/22 2310 04/27/22 2330 04/28/22 0030  BP: 137/62 123/64 122/64 127/61  Pulse: (!) 53 (!) 57 (!) 57 60  Resp: 15  16 16   Temp:      TempSrc:      SpO2: 98% 96% 97% 96%  Weight:      Height:       Constitutional: NAD, calm, comfortable, frail chronically ill-appearing male laying flat in bed Eyes: lids and conjunctivae normal ENMT: Mucous membranes are moist. Neck: normal, supple Respiratory: clear to auscultation bilaterally, no wheezing, no crackles. Normal respiratory effort. No accessory muscle use.  Cardiovascular: Regular rate and rhythm, no murmurs / rubs / gallops. No extremity edema.   Abdomen: no tenderness, Bowel sounds positive.  Musculoskeletal: no clubbing / cyanosis. No joint deformity upper and lower extremities.No shortening of extremities. Normal muscle tone.  Skin: no rashes, lesions, ulcers. No induration Neurologic: CN 2-12 grossly intact.  Intact sensation of bilateral LE.  Psychiatric: Normal judgment and insight. Alert and oriented x 3. Normal mood. Data Reviewed:  See HPI  Assessment and Plan: * Hip fracture - Right hip x-ray revealing right femoral neck fracture with impaction and angulation following mechanical fall - Orthopedic Dr. [name] will see in consultation in the morning. Keep NPO past midnight -PRN IV opioid for pain  Normocytic anemia -stable. Hgb of 11.4 -recently received Feraheme with oncology  Chronic combined systolic and diastolic CHF (congestive heart failure) - Stable.  Receiving gentle IV fluid due to AKI  History of stroke - Currently holding Eliquis due to right hip fracture requiring fixation  PAF (paroxysmal atrial fibrillation) - Currently rate controlled - Hold Eliquis with pending hip fracture  fixation with orthopedic in the morning.  Last took dose on the morning of 4/6  Essential hypertension -controlled  AKI (acute kidney injury) -creatinine of 1.31 up from prior of 0.98 -continuous IV fluid overnight and follow creatinine in the morning  Primary adenocarcinoma of upper lobe of right lung -has underwent 3 cycles of pembrolizumab. Last cycle on 04/22/22. -follows with Dr. Ellin Saba with [facility]  COPD (chronic obstructive pulmonary disease) - Stable not in exacerbation      Advance Care Planning: Full  Consults: orthopedic surgery  Family Communication: partner at bedside  Severity of Illness: The appropriate patient status for this patient is INPATIENT. Inpatient status is judged to be reasonable and necessary in order to provide the required intensity of  service to ensure the patient's safety. The patient's presenting symptoms, physical exam findings, and initial radiographic and laboratory data in the context of their chronic comorbidities is felt to place them at high risk for further clinical deterioration. Furthermore, it is not anticipated that the patient will be medically stable for discharge from the hospital within 2 midnights of admission.   * I certify that at the point of admission it is my clinical judgment that the patient will require inpatient hospital care spanning beyond 2 midnights from the point of admission due to high intensity of service, high risk for further deterioration and high frequency of surveillance required.*  Author: Anselm Jungling, DO 04/28/2022 1:49 AM  For on call review [email or web address].

## 2022-04-28 NOTE — Anesthesia Postprocedure Evaluation (Signed)
Anesthesia Post Note  Patient: Darryl Ward  Procedure(s) Performed: ARTHROPLASTY BIPOLAR HIP (HEMIARTHROPLASTY) (Right: Hip)     Patient location during evaluation: PACU Anesthesia Type: General Level of consciousness: awake and alert Pain management: pain level controlled Vital Signs Assessment: post-procedure vital signs reviewed and stable Respiratory status: spontaneous breathing, nonlabored ventilation, respiratory function stable and patient connected to nasal cannula oxygen Cardiovascular status: blood pressure returned to baseline and stable Postop Assessment: no apparent nausea or vomiting Anesthetic complications: no   No notable events documented.  Last Vitals:  Vitals:   04/28/22 1329 04/28/22 1513  BP: 120/67 (!) 98/59  Pulse: 63 62  Resp: 16 15  Temp: 36.4 C 36.4 C  SpO2: 95% 100%    Last Pain:  Vitals:   04/28/22 1600  TempSrc:   PainSc: 0-No pain                 [name]

## 2022-04-28 NOTE — ED Notes (Signed)
ED TO INPATIENT HANDOFF REPORT  ED Nurse Name and Phone #: Leeba Barbe  S Name/Age/Gender Darryl Ward 77 y.o. male Room/Bed: 031C/031C  Code Status   Code Status: Full Code  Home/SNF/Other Home Patient oriented to: self, place, time, and situation Is this baseline? Yes   Triage Complete: Triage complete  Chief Complaint Hip fracture [S72.009A]  Triage Note Pt to ED for mechanical trip and fall, coming from home. Right leg and hip pain. Shortening present. Denies LOC, negative head strike. fentanyl given PTA. +thinners. AAOx4  98%RA 140 SBP 112 bgl    Allergies No Known Allergies  Level of Care/Admitting Diagnosis ED Disposition     ED Disposition  Admit   Condition  --   Comment  Hospital [facility]: [facility] [[id number]]  Level of Care: Telemetry Medical [104]  May admit patient to [facility] or [facility] if equivalent level of care is available:: No  Covid Evaluation: Asymptomatic - no recent exposure (last 10 days) testing not required  Diagnosis: Hip fracture [956213]  Admitting Physician: Anselm Jungling [[id number]]  Attending Physician: Anselm Jungling [[id number]]  Certification:: I certify this patient will need inpatient services for at least 2 midnights  Estimated Length of Stay: 3          B Medical/Surgery History Past Medical History:  Diagnosis Date   AAA (abdominal aortic aneurysm)    Arthritis    Cancer    Carotid artery disease    Nonobstructive   Cataract    COPD (chronic obstructive pulmonary disease)    Coronary atherosclerosis of native coronary artery    PTCA small diagonal 2007 otherwise nonobstructive CAD   Depression    Dysrhythmia    Essential hypertension, benign    Hyperlipidemia    NSTEMI (non-ST elevated myocardial infarction) 2007   Stroke 2004   TIA (transient ischemic attack) 2006   Past Surgical History:  Procedure Laterality Date   AORTA - BILATERAL FEMORAL ARTERY BYPASS GRAFT  01/08/2012    Procedure: AORTA BIFEMORAL BYPASS GRAFT;  Surgeon: Sherren Kerns, MD;  [facility] OR;  Service: Vascular;  Laterality: Bilateral;  using 18x25mm x 40cm Hemashield Gold Vascular Graft with Endarterectomy, Thombectomy and  Reimplantation of Inferior Mesenteric Artery   BACK SURGERY  2021   BRONCHIAL BIOPSY  06/11/2021   Procedure: BRONCHIAL BIOPSIES;  Surgeon: Leslye Peer, MD;  [facility] ENDOSCOPY;  Service: Pulmonary;;   BRONCHIAL BRUSHINGS  06/11/2021   Procedure: BRONCHIAL BRUSHINGS;  Surgeon: Leslye Peer, MD;  Location: [facility] ENDOSCOPY;  Service: Pulmonary;;   BRONCHIAL NEEDLE ASPIRATION BIOPSY  06/11/2021   Procedure: BRONCHIAL NEEDLE ASPIRATION BIOPSIES;  Surgeon: Leslye Peer, MD;  Location: [facility] ENDOSCOPY;  Service: Pulmonary;;   CARDIOVERSION N/A 09/20/2021   Procedure: CARDIOVERSION;  Surgeon: [name], MD;  Location: [facility] ORS;  Service: Cardiovascular;  Laterality: N/A;   COLONOSCOPY N/A 04/14/2019   Procedure: COLONOSCOPY;  Surgeon: Corbin Ade, MD;  [facility];  Service: Endoscopy;  Laterality: N/A;  9:30   COLONOSCOPY WITH PROPOFOL N/A 07/25/2021   Procedure: COLONOSCOPY WITH PROPOFOL;  Surgeon: Lanelle Bal, DO;  [facility];  Service: Endoscopy;  Laterality: N/A;  1:00pm   FIDUCIAL MARKER PLACEMENT  06/11/2021   Procedure: FIDUCIAL MARKER PLACEMENT;  Surgeon: Leslye Peer, MD;  Location: [facility] ENDOSCOPY;  Service: Pulmonary;;   INTERCOSTAL NERVE BLOCK Right 11/12/2021   Procedure: INTERCOSTAL NERVE BLOCK;  Surgeon: Loreli Slot, MD;  [facility] OR;  Service: Thoracic;  Laterality: Right;   IR IMAGING GUIDED PORT INSERTION  07/31/2021   Left cataract surgery     LYMPH NODE DISSECTION Right 11/12/2021   Procedure: LYMPH NODE DISSECTION;  Surgeon: Loreli SlotHendrickson, Steven C, MD;  [facility];  Service: Thoracic;  Laterality: Right;   POLYPECTOMY  04/14/2019   Procedure: POLYPECTOMY;  Surgeon: Corbin Adeourk, Robert M, MD;   [facility];  Service: Endoscopy;;   POLYPECTOMY  07/25/2021   Procedure: POLYPECTOMY;  Surgeon: Lanelle Balarver, Charles K, DO;  [facility];  Service: Endoscopy;;   TEE WITHOUT CARDIOVERSION N/A 09/20/2021   Procedure: TRANSESOPHAGEAL ECHOCARDIOGRAM (TEE);  Surgeon: Jonelle SidleMcDowell, Samuel G, MD;  Location: [facility] ORS;  Service: Cardiovascular;  Laterality: N/A;   TRANSFORAMINAL LUMBAR INTERBODY FUSION (TLIF) WITH PEDICLE SCREW FIXATION 1 LEVEL N/A 04/27/2020   Procedure: Transforaminal Lumbar Interbody Fusion Lumbar Five-Sacral One;  Surgeon: Bedelia Personhomas, Jonathan G, MD;  [facility];  Service: Neurosurgery;  Laterality: N/A;   VIDEO BRONCHOSCOPY WITH INSERTION OF INTERBRONCHIAL VALVE (IBV) N/A 11/22/2021   Procedure: VIDEO BRONCHOSCOPY WITH INSERTION OF INTERBRONCHIAL VALVE (IBV);  Surgeon: Loreli SlotHendrickson, Steven C, MD;  [facility];  Service: Thoracic;  Laterality: N/A;   VIDEO BRONCHOSCOPY WITH INSERTION OF INTERBRONCHIAL VALVE (IBV) N/A 01/24/2022   Procedure: VIDEO BRONCHOSCOPY WITH REMOVAL OF INTERBRONCHIAL VALVE (IBV);  Surgeon: Loreli SlotHendrickson, Steven C, MD;  [facility];  Service: Thoracic;  Laterality: N/A;   VIDEO BRONCHOSCOPY WITH RADIAL ENDOBRONCHIAL ULTRASOUND  06/11/2021   Procedure: VIDEO BRONCHOSCOPY WITH RADIAL ENDOBRONCHIAL ULTRASOUND;  Surgeon: Leslye PeerByrum, Robert S, MD;  [facility]: [facility] ENDOSCOPY;  Service: Pulmonary;;     A IV Location/Drains/Wounds Patient Lines/Drains/Airways Status     Active Line/Drains/Airways     Name Placement date Placement time Site Days   Implanted Port 07/31/21 Right Chest 07/31/21  1347  Chest  271   Implanted Port 11/12/21 Left Chest 11/12/21  1601  Chest  167   Peripheral IV 04/27/22 18 G Left Antecubital 04/27/22  2125  Antecubital  1   Chest Tube Right Pleural 28 Fr. 11/12/21  1130  Pleural  167            Intake/Output Last 24 hours No intake or output data in the 24 hours ending 04/28/22 0120  Labs/Imaging Results for orders  placed or performed during the hospital encounter of 04/27/22 (from the past 48 hour(s))  Basic metabolic panel     Status: Abnormal   Collection Time: 04/27/22  9:29 PM  Result Value Ref Range   Sodium 141 135 - 145 mmol/L   Potassium 4.3 3.5 - 5.1 mmol/L   Chloride 104 98 - 111 mmol/L   CO2 24 22 - 32 mmol/L   Glucose, Bld 99 70 - 99 mg/dL    Comment: Glucose reference range applies only to samples taken after fasting for at least 8 hours.   BUN 14 8 - 23 mg/dL   Creatinine, Ser 4.091.31 (H) 0.61 - 1.24 mg/dL   Calcium 9.3 8.9 - 81.110.3 mg/dL   GFR, Estimated 57 (L) >60 mL/min    Comment: (NOTE) Calculated using the CKD-EPI Creatinine Equation (2021)    Anion gap 13 5 - 15    Comment: Performed at [facility], [address] [place], [place] [address]  CBC with Differential     Status: Abnormal   Collection Time: 04/27/22  9:29 PM  Result Value Ref Range   WBC 8.0 4.0 - 10.5 K/uL  RBC 3.75 (L) 4.22 - 5.81 MIL/uL   Hemoglobin 11.4 (L) 13.0 - 17.0 g/dL   HCT 97.5 (L) 30.0 - 51.1 %   MCV 96.0 80.0 - 100.0 fL   MCH 30.4 26.0 - 34.0 pg   MCHC 31.7 30.0 - 36.0 g/dL   RDW 02.1 11.7 - 35.6 %   Platelets 131 (L) 150 - 400 K/uL   nRBC 0.0 0.0 - 0.2 %   Neutrophils Relative % 67 %   Neutro Abs 5.4 1.7 - 7.7 K/uL   Lymphocytes Relative 20 %   Lymphs Abs 1.6 0.7 - 4.0 K/uL   Monocytes Relative 8 %   Monocytes Absolute 0.6 0.1 - 1.0 K/uL   Eosinophils Relative 4 %   Eosinophils Absolute 0.3 0.0 - 0.5 K/uL   Basophils Relative 1 %   Basophils Absolute 0.1 0.0 - 0.1 K/uL   Immature Granulocytes 0 %   Abs Immature Granulocytes 0.03 0.00 - 0.07 K/uL    Comment: Performed at [facility], [address] [place], [place] [address]  Protime-INR     Status: Abnormal   Collection Time: 04/27/22  9:29 PM  Result Value Ref Range   Prothrombin Time 15.7 (H) 11.4 - 15.2 seconds   INR 1.3 (H) 0.8 - 1.2    Comment: (NOTE) INR goal varies based on device and disease states. Performed  at [facility], [address], [place], [place] [address]   CBG monitoring, ED     Status: None   Collection Time: 04/27/22  9:36 PM  Result Value Ref Range   Glucose-Capillary 94 70 - 99 mg/dL    Comment: Glucose reference range applies only to samples taken after fasting for at least 8 hours.  Type and screen [facility]     Status: None   Collection Time: 04/27/22 10:19 PM  Result Value Ref Range   ABO/RH(D) O NEG    Antibody Screen NEG    Sample Expiration      04/30/2022,2359 Performed at [facility], [address], [place], [place] [address]    DG Hip Unilat With Pelvis 2-3 Views Right  Result Date: 04/27/2022 CLINICAL DATA:  Recent fall with right leg pain, initial encounter EXAM: DG HIP (WITH OR WITHOUT PELVIS) 2-3V RIGHT COMPARISON:  None Available. FINDINGS: Pelvic ring is intact. There is a mid right femoral neck fracture with impaction and angulation at the fracture site. Femoral head is well seated. Postsurgical changes are noted in the lower lumbar spine. IMPRESSION: Right femoral neck fracture with impaction and angulation at the fracture site. Electronically Signed   By: Alcide Clever M.D.   On: 04/27/2022 23:06    Pending Labs Unresulted Labs (From admission, onward)    None       Vitals/Pain Today's Vitals   04/27/22 2310 04/27/22 2330 04/27/22 2336 04/28/22 0030  BP: 123/64 122/64  127/61  Pulse: (!) 57 (!) 57  60  Resp:  16  16  Temp:      TempSrc:      SpO2: 96% 97%  96%  Weight:      Height:      PainSc:   6      Isolation Precautions No active isolations  Medications Medications  HYDROmorphone (DILAUDID) injection 0.5 mg (0.5 mg Intravenous Given 04/27/22 2229)  HYDROcodone-acetaminophen (NORCO/VICODIN) 5-325 MG per tablet 1-2 tablet (has no administration in time range)  morphine (PF) 2 MG/ML injection 0.5 mg (has  no administration in time range)  sodium chloride 0.9 % bolus 500 mL (0 mLs Intravenous Stopped  04/27/22 2314)    And  0.9 %  sodium chloride infusion (has no administration in time range)  ondansetron (ZOFRAN) injection 4 mg (4 mg Intravenous Given 04/27/22 2228)    Mobility non-ambulatory     Focused Assessments     R Recommendations: See Admitting Provider Note  Report given to:   Additional Notes:

## 2022-04-28 NOTE — Interval H&P Note (Signed)
History and Physical Interval Note:  04/28/2022 9:30 AM  Darryl Ward  has presented today for surgery, with the diagnosis of RIGHT HIP FRACTURE.  The various methods of treatment have been discussed with the patient and family. After consideration of risks, benefits and other options for treatment, the patient has consented to  Procedure(s): ARTHROPLASTY BIPOLAR HIP (HEMIARTHROPLASTY) (Right) as a surgical intervention.  The patient's history has been reviewed, patient examined, no change in status, stable for surgery.  I have reviewed the patient's chart and labs.  Questions were answered to the patient's satisfaction.     Sheral Apley

## 2022-04-28 NOTE — Assessment & Plan Note (Signed)
-  stable. Hgb of 11.4 -recently received Feraheme with oncology

## 2022-04-28 NOTE — Assessment & Plan Note (Signed)
-  creatinine of 1.31 up from prior of 0.98 -continuous IV fluid overnight and follow creatinine in the morning

## 2022-04-28 NOTE — Progress Notes (Signed)
Orthopedic Tech Progress Note Patient Details:  Darryl Ward Mar 13, 1945 [id number]  Patient ID: [name], male   DOB: 1945/04/15, 77 y.o.   MRN: [id number] The patient doesn't meet criteria for ohf. Patient must me under 70 to get ohf. Trinna Post 04/28/2022, 4:14 AM

## 2022-04-28 NOTE — H&P (View-Only) (Signed)
   ORTHOPAEDIC CONSULTATION  REQUESTING PHYSICIAN: Pokhrel, Laxman, MD  Chief Complaint: right hip pain  HPI: Darryl Ward is a 77 y.o. male currently undergoing immunotherapy for lung cancer who complains of  pain in the right hip after tripping and falling at home yesterday. He had immediate pain and inability to bear weight.   Imaging shows a right femoral neck fracture with impaction and angulation at the fracture site.  Orthopedics was consulted for evaluation.    No history of DVT, PE. + h/o of CVA, TIA, and MI.  Previously ambulatory with assistive device.  The patient is living at home.    Past Medical History:  Diagnosis Date   AAA (abdominal aortic aneurysm)    Arthritis    Cancer    Carotid artery disease    Nonobstructive   Cataract    COPD (chronic obstructive pulmonary disease)    Coronary atherosclerosis of native coronary artery    PTCA small diagonal 2007 otherwise nonobstructive CAD   Depression    Dysrhythmia    Essential hypertension, benign    Hyperlipidemia    NSTEMI (non-ST elevated myocardial infarction) 2007   Stroke 2004   TIA (transient ischemic attack) 2006   Past Surgical History:  Procedure Laterality Date   AORTA - BILATERAL FEMORAL ARTERY BYPASS GRAFT  01/08/2012   Procedure: AORTA BIFEMORAL BYPASS GRAFT;  Surgeon: Charles E Fields, MD;  Location: [facility] OR;  Service: Vascular;  Laterality: Bilateral;  using 18x9mm x 40cm Hemashield Gold Vascular Graft with Endarterectomy, Thombectomy and  Reimplantation of Inferior Mesenteric Artery   BACK SURGERY  2021   BRONCHIAL BIOPSY  06/11/2021   Procedure: BRONCHIAL BIOPSIES;  Surgeon: Byrum, Robert S, MD;  Location: [facility];  Service: Pulmonary;;   BRONCHIAL BRUSHINGS  06/11/2021   Procedure: BRONCHIAL BRUSHINGS;  Surgeon: Byrum, Robert S, MD;  Location: [facility];  Service: Pulmonary;;   BRONCHIAL NEEDLE ASPIRATION BIOPSY  06/11/2021   Procedure: BRONCHIAL NEEDLE ASPIRATION BIOPSIES;   Surgeon: Byrum, Robert S, MD;  Location: [facility];  Service: Pulmonary;;   CARDIOVERSION N/A 09/20/2021   Procedure: CARDIOVERSION;  Surgeon: McDowell, Samuel G, MD;  Location: [facility];  Service: Cardiovascular;  Laterality: N/A;   COLONOSCOPY N/A 04/14/2019   Procedure: COLONOSCOPY;  Surgeon: Rourk, Robert M, MD;  Location: [facility];  Service: Endoscopy;  Laterality: N/A;  9:30   COLONOSCOPY WITH PROPOFOL N/A 07/25/2021   Procedure: COLONOSCOPY WITH PROPOFOL;  Surgeon: Carver, Charles K, DO;  Location: [facility];  Service: Endoscopy;  Laterality: N/A;  1:00pm   FIDUCIAL MARKER PLACEMENT  06/11/2021   Procedure: FIDUCIAL MARKER PLACEMENT;  Surgeon: Byrum, Robert S, MD;  Location: [facility];  Service: Pulmonary;;   INTERCOSTAL NERVE BLOCK Right 11/12/2021   Procedure: INTERCOSTAL NERVE BLOCK;  Surgeon: Hendrickson, Steven C, MD;  Location: [facility];  Service: Thoracic;  Laterality: Right;   IR IMAGING GUIDED PORT INSERTION  07/31/2021   Left cataract surgery     LYMPH NODE DISSECTION Right 11/12/2021   Procedure: LYMPH NODE DISSECTION;  Surgeon: Hendrickson, Steven C, MD;  Location: [facility];  Service: Thoracic;  Laterality: Right;   POLYPECTOMY  04/14/2019   Procedure: POLYPECTOMY;  Surgeon: Rourk, Robert M, MD;  [facility]: [facility];  Service: Endoscopy;;   POLYPECTOMY  07/25/2021   Procedure: POLYPECTOMY;  Surgeon: Carver, Charles K, DO;  Location: [facility];  Service: Endoscopy;;   TEE WITHOUT CARDIOVERSION N/A 09/20/2021   Procedure: TRANSESOPHAGEAL ECHOCARDIOGRAM (TEE);  Surgeon:   [name], MD;  Location: [facility];  Service: Cardiovascular;  Laterality: N/A;   TRANSFORAMINAL LUMBAR INTERBODY FUSION (TLIF) WITH PEDICLE SCREW FIXATION 1 LEVEL N/A 04/27/2020   Procedure: Transforaminal Lumbar Interbody Fusion Lumbar Five-Sacral One;  Surgeon: Bedelia Person, MD;  Location[facility];  Service: Neurosurgery;  Laterality: N/A;   VIDEO BRONCHOSCOPY WITH INSERTION OF  INTERBRONCHIAL VALVE (IBV) N/A 11/22/2021   Procedure: VIDEO BRONCHOSCOPY WITH INSERTION OF INTERBRONCHIAL VALVE (IBV);  Surgeon: Loreli Slot, MD;  Location: [facility] OR;  Service: Thoracic;  Laterality: N/A;   VIDEO BRONCHOSCOPY WITH INSERTION OF INTERBRONCHIAL VALVE (IBV) N/A 01/24/2022   Procedure: VIDEO BRONCHOSCOPY WITH REMOVAL OF INTERBRONCHIAL VALVE (IBV);  Surgeon: Loreli Slot, MD;  Location: [facility] OR;  Service: Thoracic;  Laterality: N/A;   VIDEO BRONCHOSCOPY WITH RADIAL ENDOBRONCHIAL ULTRASOUND  06/11/2021   Procedure: VIDEO BRONCHOSCOPY WITH RADIAL ENDOBRONCHIAL ULTRASOUND;  Surgeon: Leslye Peer, MD;  Location: [facility];  Service: Pulmonary;;   Social History   Socioeconomic History   Marital status: Divorced    Spouse name: Not on file   Number of children: 1   Years of education: 11   Highest education level: 11th grade  Occupational History    Employer: Engineer, materials  Tobacco Use   Smoking status: Former    Packs/day: 1.00    Years: 40.00    Additional pack years: 0.00    Total pack years: 40.00    Types: Cigarettes    Quit date: 07/2021    Years since quitting: 0.7   Smokeless tobacco: Never   Tobacco comments:    1 pack of cigarettes smoked daily. 07/17/21 ARJ, RN   Vaping Use   Vaping Use: Never used  Substance and Sexual Activity   Alcohol use: No    Comment: Prior history of regular alcohol use   Drug use: No   Sexual activity: Not Currently  Other Topics Concern   Not on file  Social History Narrative   Not on file   Social Determinants of Health   Financial Resource Strain: Low Risk  (11/29/2019)   Overall Financial Resource Strain (CARDIA)    Difficulty of Paying Living Expenses: Not hard at all  Food Insecurity: No Food Insecurity (11/29/2021)   Hunger Vital Sign    Worried About Running Out of Food in the Last Year: Never true    Ran Out of Food in the Last Year: Never true  Transportation Needs: No Transportation Needs  (11/29/2021)   PRAPARE - Administrator, Civil Service (Medical): No    Lack of Transportation (Non-Medical): No  Physical Activity: Inactive (11/29/2019)   Exercise Vital Sign    Days of Exercise per Week: 0 days    Minutes of Exercise per Session: 0 min  Stress: No Stress Concern Present (11/29/2019)   [employer] of Occupational Health - Occupational Stress Questionnaire    Feeling of Stress : Only a little  Social Connections: Moderately Isolated (11/29/2019)   Social Connection and Isolation Panel [NHANES]    Frequency of Communication with Friends and Family: More than three times a week    Frequency of Social Gatherings with Friends and Family: More than three times a week    Attends Religious Services: 1 to 4 times per year    Active Member of [employer] or Organizations: No    Attends Banker Meetings: Never    Marital Status: Divorced   Family History  Problem Relation Age of Onset  Hyperlipidemia Sister    Heart attack Brother 3   Cancer - Colon Neg Hx    No Known Allergies Prior to Admission medications   Medication Sig Start Date End Date Taking? Authorizing Provider  acetaminophen (TYLENOL) 500 MG tablet Take 2 tablets (1,000 mg total) by mouth every 6 (six) hours as needed. Patient taking differently: Take 1,000 mg by mouth 2 (two) times daily as needed for moderate pain. 11/28/21   Barrett, Erin R, PA-C  albuterol (VENTOLIN HFA) 108 (90 Base) MCG/ACT inhaler Inhale 2 puffs into the lungs every 6 (six) hours as needed for wheezing or shortness of breath. 01/23/22   Leslye Peer, MD  apixaban (ELIQUIS) 5 MG TABS tablet Take 1 tablet (5 mg total) by mouth 2 (two) times daily. 04/22/22   [name], MD  atorvastatin (LIPITOR) 80 MG tablet Take 80 mg by mouth daily.    [provider]  cyclobenzaprine (FLEXERIL) 10 MG tablet Take 1 tablet (10 mg total) by mouth 2 (two) times daily as needed. 04/05/21     escitalopram (LEXAPRO) 10 MG  tablet Take 1 tablet (10 mg total) by mouth daily. 03/11/22   [name], MD  folic acid (FOLVITE) 1 MG tablet Take 1 tablet (1 mg total) by mouth daily. 02/12/22   [name], MD  furosemide (LASIX) 20 MG tablet Take 1 tablet (20 mg total) by mouth daily as needed. Patient taking differently: Take 20 mg by mouth in the morning. 10/17/21   [name], MD  lidocaine-prilocaine (EMLA) cream Apply a small amount to port a cath site and cover with plastic wrap 1 hour prior to infusion appointments 08/02/21   [name], MD  Magnesium Oxide 400 MG CAPS Take 1 capsule (400 mg total) by mouth daily. 12/27/21   Sharlene Dory, PA-C  metoprolol succinate (TOPROL-XL) 50 MG 24 hr tablet Take 1.5 tablets (75 mg total) by mouth daily. Take with or immediately following a meal. 12/27/21   Sharlene Dory, PA-C  prochlorperazine (COMPAZINE) 10 MG tablet Take 1 tablet (10 mg total) by mouth every 6 (six) hours as needed for nausea/vomiting 08/02/21   [name], MD  Tiotropium Bromide Monohydrate (SPIRIVA RESPIMAT) 2.5 MCG/ACT AERS Inhale 2 puffs into the lungs daily. 01/23/22   Leslye Peer, MD  traMADol (ULTRAM) 50 MG tablet Take 1 tablet (50 mg total) by mouth every 6 (six) hours as needed (mild pain). 11/28/21   Barrett, Rae Roam, PA-C   DG Hip Unilat With Pelvis 2-3 Views Right  Result Date: 04/27/2022 CLINICAL DATA:  Recent fall with right leg pain, initial encounter EXAM: DG HIP (WITH OR WITHOUT PELVIS) 2-3V RIGHT COMPARISON:  None Available. FINDINGS: Pelvic ring is intact. There is a mid right femoral neck fracture with impaction and angulation at the fracture site. Femoral head is well seated. Postsurgical changes are noted in the lower lumbar spine. IMPRESSION: Right femoral neck fracture with impaction and angulation at the fracture site. Electronically Signed   By: Alcide Clever M.D.   On: 04/27/2022 23:06    Positive ROS: All other systems have been reviewed and  were otherwise negative with the exception of those mentioned in the HPI and as above.  Objective: Labs cbc Recent Labs    04/27/22 2129  WBC 8.0  HGB 11.4*  HCT 36.0*  PLT 131*    Labs inflam No results for input(s): "CRP" in the last 72 hours.  Invalid input(s): "ESR"  Labs coag Recent Labs  04/27/22 2129  INR 1.3*    Recent Labs    04/27/22 2129  NA 141  K 4.3  CL 104  CO2 24  GLUCOSE 99  BUN 14  CREATININE 1.31*  CALCIUM 9.3    Physical Exam: Vitals:   04/28/22 0130 04/28/22 0212  BP: 113/69 136/66  Pulse: (!) 59 63  Resp: 14 16  Temp:  98.4 F (36.9 C)  SpO2: 96% 97%   General: Alert, no acute distress.  Laying supine on bed, calm Mental status: Alert and Oriented x3 Neurologic: Speech Clear and organized, no gross focal findings or movement disorder appreciated. Respiratory: No cyanosis, no use of accessory musculature Cardiovascular: No pedal edema GI: Abdomen is soft and non-tender, non-distended. Skin: Warm and dry.  No lesions in the area of chief complaint. Extremities: Warm and well perfused w/o edema Psychiatric: Patient is competent for consent with normal mood and affect  MUSCULOSKELETAL:  TTP right hip, limited ROM d/t pain, leg shortened and externally rotated, NVI Other extremities are atraumatic with painless ROM and NVI.  Assessment / Plan: Principal Problem:   Hip fracture Active Problems:   COPD (chronic obstructive pulmonary disease)   Primary adenocarcinoma of upper lobe of right lung   AKI (acute kidney injury)   Essential hypertension   PAF (paroxysmal atrial fibrillation)   History of stroke   Chronic combined systolic and diastolic CHF (congestive heart failure)   Normocytic anemia    Will plan to take the patient to the OR today for a right hip hemiarthroplasty vs. IMN. Keep NPO and hold Eliquis.   Weightbearing: NWB RLE VTE prophylaxis:  on hold for surgery   Pain control: PRN Follow - up plan: 2 weeks  post op Contact information:  Margarita Rana MD, [facility] PA-C  Jenne Pane PA-C Office [phone] 04/28/2022 7:53 AM

## 2022-04-28 NOTE — Anesthesia Preprocedure Evaluation (Signed)
Anesthesia Evaluation  Patient identified by MRN, date of birth, ID band Patient awake    Reviewed: Allergy & Precautions, NPO status , Patient's Chart, lab work & pertinent test results  Airway Mallampati: II  TM Distance: >3 FB Neck ROM: Full    Dental  (+) Edentulous Upper, Edentulous Lower   Pulmonary COPD, former smoker   Pulmonary exam normal        Cardiovascular hypertension, Pt. on medications and Pt. on home beta blockers + CAD, + Past MI, + Peripheral Vascular Disease and +CHF  + dysrhythmias  Rhythm:Irregular Rate:Normal  ECHO 02/24:  1. Left ventricular ejection fraction, by estimation, is approximately  55%. The left ventricle has normal function. The left ventricle  demonstrates regional wall motion abnormalities (see scoring  diagram/findings for description). Left ventricular  diastolic parameters are consistent with Grade II diastolic dysfunction  (pseudonormalization).   2. Right ventricular systolic function is normal. The right ventricular  size is normal. There is normal pulmonary artery systolic pressure. The  estimated right ventricular systolic pressure is 35.0 mmHg.   3. Left atrial size was mildly dilated.   4. The mitral valve is grossly normal. Mild mitral valve regurgitation.   5. The aortic valve is tricuspid. Aortic valve regurgitation is not  visualized.   6. Aortic dilatation noted. 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   7. The inferior vena cava is dilated in size with >50% respiratory  variability, suggesting right atrial pressure of 8 mmHg.   Comparison(s): Prior images reviewed side by side. LVEF has improved in  comparison.      Neuro/Psych    Depression    CVA    GI/Hepatic negative GI ROS, Neg liver ROS,,,  Endo/Other  negative endocrine ROS    Renal/GU   negative genitourinary   Musculoskeletal  (+) Arthritis , Osteoarthritis,     Abdominal Normal abdominal exam  (+)   Peds  Hematology  (+) Blood dyscrasia, anemia   Anesthesia Other Findings   Reproductive/Obstetrics                             Anesthesia Physical Anesthesia Plan  ASA: 3  Anesthesia Plan: General   Post-op Pain Management:    Induction: Intravenous  PONV Risk Score and Plan: 2 and Ondansetron, Dexamethasone and Treatment may vary due to age or medical condition  Airway Management Planned: Mask and Oral ETT  Additional Equipment: None  Intra-op Plan:   Post-operative Plan: Extubation in OR  Informed Consent: I have reviewed the patients History and Physical, chart, labs and discussed the procedure including the risks, benefits and alternatives for the proposed anesthesia with the patient or authorized representative who has indicated his/her understanding and acceptance.     Dental advisory given  Plan Discussed with: CRNA  Anesthesia Plan Comments: Everlene Balls(- ELIQUIS 04/27/22  Lab Results      Component                Value               Date                      WBC                      8.0  04/27/2022                HGB                      11.4 (L)            04/27/2022                HCT                      36.0 (L)            04/27/2022                MCV                      96.0                04/27/2022                PLT                      131 (L)             04/27/2022             Lab Results      Component                Value               Date                      NA                       141                 04/27/2022                K                        4.3                 04/27/2022                CO2                      24                  04/27/2022                GLUCOSE                  99                  04/27/2022                BUN                      14                  04/27/2022                CREATININE               1.31 (H)            04/27/2022  CALCIUM                  9.3                 04/27/2022                EGFR                     86                  01/08/2022                GFRNONAA                 57 (L)              04/27/2022           )       Anesthesia Quick Evaluation

## 2022-04-28 NOTE — Progress Notes (Signed)
Same day note Darryl Ward is a 77 y.o. male with past medical history significant of paroxysmal atrial fibrillation on Apixaban, hypertension, HTN, combined systolic and diastolic heart failure, s/p AAA repair, CVA, CAD, COPD, adenocarcinoma of the right lung on immunotherapy [facility] with fall after he tripped.  On Eliquis.  In the ED, hemoglobin was 11.4 with baseline around 10.  Has chronic thrombocytopenia at 131.  Creatinine was 1.3 from previous 0.9.  X-ray of the hip showed a right femoral fracture with impaction and angulation.  Orthopedics was notified from the ED and patient was at the hospital for further evaluation and treatment.  Patient seen and examined at bedside.  Patient was admitted to the hospital for hip fracture  At the time of my evaluation, patient complains of feeling okay.  Seen after surgical intervention.  Physical examination reveals surgical intervention with dressing over the right hip.  Laboratory data and imaging was reviewed  Assessment and Plan: *Right hip fracture Orthopedics on board.  Status post right hip hemiarthroplasty.  Continue care as per orthopedics.  Normocytic anemia Hemoglobin of 11.4. Recently received Feraheme with oncology   Chronic combined systolic and diastolic CHF (congestive heart failure) Patient received gentle IV fluids.  Currently compensated.  On Lasix at 20 mg daily as needed at home.   History of stroke Currently Eliquis on hold due to right hip fracture    PAF (paroxysmal atrial fibrillation) Rate controlled.  Last dose of Eliquis on the morning of 04/27/2022.  Essential hypertension Controlled.  Patient is on metoprolol from home.  Will resume while in the hospital when off n.p.o..   AKI (acute kidney injury) -creatinine of 1.31 up from prior of 0.98.  Received IV fluids.  Primary adenocarcinoma of upper lobe of right lung -has underwent 3 cycles of pembrolizumab. Last cycle on 04/22/22. Follows up with  Dr. Ellin Saba with [facility]   COPD (chronic obstructive pulmonary disease) - Stable not in exacerbation continue bronchodilators.      No Charge  Signed,  Tenny Craw, MD [facility]

## 2022-04-28 NOTE — Assessment & Plan Note (Signed)
-   Stable not in exacerbation

## 2022-04-28 NOTE — Assessment & Plan Note (Addendum)
-   Currently rate controlled - Hold Eliquis with pending hip fracture fixation with orthopedic in the morning.  Last took dose on the morning of 4/6

## 2022-04-28 NOTE — Consult Note (Signed)
ORTHOPAEDIC CONSULTATION  REQUESTING PHYSICIAN: Pokhrel, Laxman, MD  Chief Complaint: right hip pain  HPI: Darryl Ward is a 77 y.o. male currently undergoing immunotherapy for lung cancer who complains of  pain in the right hip after tripping and falling at home yesterday. He had immediate pain and inability to bear weight.   Imaging shows a right femoral neck fracture with impaction and angulation at the fracture site.  Orthopedics was consulted for evaluation.    No history of DVT, PE. + h/o of CVA, TIA, and MI.  Previously ambulatory with assistive device.  The patient is living at home.    Past Medical History:  Diagnosis Date   AAA (abdominal aortic aneurysm)    Arthritis    Cancer    Carotid artery disease    Nonobstructive   Cataract    COPD (chronic obstructive pulmonary disease)    Coronary atherosclerosis of native coronary artery    PTCA small diagonal 2007 otherwise nonobstructive CAD   Depression    Dysrhythmia    Essential hypertension, benign    Hyperlipidemia    NSTEMI (non-ST elevated myocardial infarction) 2007   Stroke 2004   TIA (transient ischemic attack) 2006   Past Surgical History:  Procedure Laterality Date   AORTA - BILATERAL FEMORAL ARTERY BYPASS GRAFT  01/08/2012   Procedure: AORTA BIFEMORAL BYPASS GRAFT;  Surgeon: Sherren Kernsharles E Fields, MD;  Location: [facility] OR;  Service: Vascular;  Laterality: Bilateral;  using 18x849mm x 40cm Hemashield Gold Vascular Graft with Endarterectomy, Thombectomy and  Reimplantation of Inferior Mesenteric Artery   BACK SURGERY  2021   BRONCHIAL BIOPSY  06/11/2021   Procedure: BRONCHIAL BIOPSIES;  Surgeon: Leslye PeerByrum, Robert S, MD;  Location: [facility] ENDOSCOPY;  Service: Pulmonary;;   BRONCHIAL BRUSHINGS  06/11/2021   Procedure: BRONCHIAL BRUSHINGS;  Surgeon: Leslye PeerByrum, Robert S, MD;  [facility];  Service: Pulmonary;;   BRONCHIAL NEEDLE ASPIRATION BIOPSY  06/11/2021   Procedure: BRONCHIAL NEEDLE ASPIRATION BIOPSIES;   Surgeon: Leslye PeerByrum, Robert S, MD;  [facility];  Service: Pulmonary;;   CARDIOVERSION N/A 09/20/2021   Procedure: CARDIOVERSION;  Surgeon: Jonelle SidleMcDowell, Samuel G, MD;  Location: [facility] ORS;  Service: Cardiovascular;  Laterality: N/A;   COLONOSCOPY N/A 04/14/2019   Procedure: COLONOSCOPY;  Surgeon: Corbin Adeourk, Robert M, MD;  Location: [facility] SUITE;  Service: Endoscopy;  Laterality: N/A;  9:30   COLONOSCOPY WITH PROPOFOL N/A 07/25/2021   Procedure: COLONOSCOPY WITH PROPOFOL;  Surgeon: Lanelle Balarver, Charles K, DO;  Location: [facility];  Service: Endoscopy;  Laterality: N/A;  1:00pm   FIDUCIAL MARKER PLACEMENT  06/11/2021   Procedure: FIDUCIAL MARKER PLACEMENT;  Surgeon: Leslye PeerByrum, Robert S, MD;  [facility];  Service: Pulmonary;;   INTERCOSTAL NERVE BLOCK Right 11/12/2021   Procedure: INTERCOSTAL NERVE BLOCK;  Surgeon: Loreli SlotHendrickson, Steven C, MD;  [facility];  Service: Thoracic;  Laterality: Right;   IR IMAGING GUIDED PORT INSERTION  07/31/2021   Left cataract surgery     LYMPH NODE DISSECTION Right 11/12/2021   Procedure: LYMPH NODE DISSECTION;  Surgeon: Loreli SlotHendrickson, Steven C, MD;  [facility];  Service: Thoracic;  Laterality: Right;   POLYPECTOMY  04/14/2019   Procedure: POLYPECTOMY;  Surgeon: Corbin Adeourk, Robert M, MD;  [facility];  Service: Endoscopy;;   POLYPECTOMY  07/25/2021   Procedure: POLYPECTOMY;  Surgeon: Lanelle Balarver, Charles K, DO;  Location: [facility];  Service: Endoscopy;;   TEE WITHOUT CARDIOVERSION N/A 09/20/2021   Procedure: TRANSESOPHAGEAL ECHOCARDIOGRAM (TEE);  Surgeon:  [name], MD;  Location: [facility]S;  Service: Cardiovascular;  Laterality: N/A;   TRANSFORAMINAL LUMBAR INTERBODY FUSION (TLIF) WITH PEDICLE SCREW FIXATION 1 LEVEL N/A 04/27/2020   Procedure: Transforaminal Lumbar Interbody Fusion Lumbar Five-Sacral One;  Surgeon: Bedelia Person, MD;  Location[facility];  Service: Neurosurgery;  Laterality: N/A;   VIDEO BRONCHOSCOPY WITH INSERTION OF  INTERBRONCHIAL VALVE (IBV) N/A 11/22/2021   Procedure: VIDEO BRONCHOSCOPY WITH INSERTION OF INTERBRONCHIAL VALVE (IBV);  Surgeon: Loreli Slot, MD;  Location: [facility] OR;  Service: Thoracic;  Laterality: N/A;   VIDEO BRONCHOSCOPY WITH INSERTION OF INTERBRONCHIAL VALVE (IBV) N/A 01/24/2022   Procedure: VIDEO BRONCHOSCOPY WITH REMOVAL OF INTERBRONCHIAL VALVE (IBV);  Surgeon: Loreli Slot, MD;  [facility] OR;  Service: Thoracic;  Laterality: N/A;   VIDEO BRONCHOSCOPY WITH RADIAL ENDOBRONCHIAL ULTRASOUND  06/11/2021   Procedure: VIDEO BRONCHOSCOPY WITH RADIAL ENDOBRONCHIAL ULTRASOUND;  Surgeon: Leslye Peer, MD;  Location: [facility] ENDOS[facility];  Service: Pulmonary;;   Social History   Socioeconomic History   Marital status: Divorced    Spouse name: Not on file   Number of children: 1   Years of education: 11   Highest education level: 11th grade  Occupational History    Employer: Engineer, materials  Tobacco Use   Smoking status: Former    Packs/day: 1.00    Years: 40.00    Additional pack years: 0.00    Total pack years: 40.00    Types: Cigarettes    Quit date: 07/2021    Years since quitting: 0.7   Smokeless tobacco: Never   Tobacco comments:    1 pack of cigarettes smoked daily. 07/17/21 ARJ, RN   Vaping Use   Vaping Use: Never used  Substance and Sexual Activity   Alcohol use: No    Comment: Prior history of regular alcohol use   Drug use: No   Sexual activity: Not Currently  Other Topics Concern   Not on file  Social History Narrative   Not on file   Social Determinants of Health   Financial Resource Strain: Low Risk  (11/29/2019)   Overall Financial Resource Strain (CARDIA)    Difficulty of Paying Living Expenses: Not hard at all  Food Insecurity: No Food Insecurity (11/29/2021)   Hunger Vital Sign    Worried About Running Out of Food in the Last Year: Never true    Ran Out of Food in the Last Year: Never true  Transportation Needs: No Transportation Needs  (11/29/2021)   PRAPARE - Administrator, Civil Service (Medical): No    Lack of Transportation (Non-Medical): No  Physical Activity: Inactive (11/29/2019)   Exercise Vital Sign    Days of Exercise per Week: 0 days    Minutes of Exercise per Session: 0 min  Stress: No Stress Concern Present (11/29/2019)   [employer] of Occupational Health - Occupational Stress Questionnaire    Feeling of Stress : Only a little  Social Connections: Moderately Isolated (11/29/2019)   Social Connection and Isolation Panel [NHANES]    Frequency of Communication with Friends and Family: More than three times a week    Frequency of Social Gatherings with Friends and Family: More than three times a week    Attends Religious Services: 1 to 4 times per year    Active Member of [employer] or Organizations: No    Attends Banker Meetings: Never    Marital Status: Divorced   Family History  Problem Relation Age of Onset  Hyperlipidemia Sister    Heart attack Brother 3   Cancer - Colon Neg Hx    No Known Allergies Prior to Admission medications   Medication Sig Start Date End Date Taking? Authorizing Provider  acetaminophen (TYLENOL) 500 MG tablet Take 2 tablets (1,000 mg total) by mouth every 6 (six) hours as needed. Patient taking differently: Take 1,000 mg by mouth 2 (two) times daily as needed for moderate pain. 11/28/21   Barrett, Erin R, PA-C  albuterol (VENTOLIN HFA) 108 (90 Base) MCG/ACT inhaler Inhale 2 puffs into the lungs every 6 (six) hours as needed for wheezing or shortness of breath. 01/23/22   Leslye Peer, MD  apixaban (ELIQUIS) 5 MG TABS tablet Take 1 tablet (5 mg total) by mouth 2 (two) times daily. 04/22/22   [name], MD  atorvastatin (LIPITOR) 80 MG tablet Take 80 mg by mouth daily.    [provider]  cyclobenzaprine (FLEXERIL) 10 MG tablet Take 1 tablet (10 mg total) by mouth 2 (two) times daily as needed. 04/05/21     escitalopram (LEXAPRO) 10 MG  tablet Take 1 tablet (10 mg total) by mouth daily. 03/11/22   [name], MD  folic acid (FOLVITE) 1 MG tablet Take 1 tablet (1 mg total) by mouth daily. 02/12/22   [name], MD  furosemide (LASIX) 20 MG tablet Take 1 tablet (20 mg total) by mouth daily as needed. Patient taking differently: Take 20 mg by mouth in the morning. 10/17/21   [name], MD  lidocaine-prilocaine (EMLA) cream Apply a small amount to port a cath site and cover with plastic wrap 1 hour prior to infusion appointments 08/02/21   [name], MD  Magnesium Oxide 400 MG CAPS Take 1 capsule (400 mg total) by mouth daily. 12/27/21   Sharlene Dory, PA-C  metoprolol succinate (TOPROL-XL) 50 MG 24 hr tablet Take 1.5 tablets (75 mg total) by mouth daily. Take with or immediately following a meal. 12/27/21   Sharlene Dory, PA-C  prochlorperazine (COMPAZINE) 10 MG tablet Take 1 tablet (10 mg total) by mouth every 6 (six) hours as needed for nausea/vomiting 08/02/21   [name], MD  Tiotropium Bromide Monohydrate (SPIRIVA RESPIMAT) 2.5 MCG/ACT AERS Inhale 2 puffs into the lungs daily. 01/23/22   Leslye Peer, MD  traMADol (ULTRAM) 50 MG tablet Take 1 tablet (50 mg total) by mouth every 6 (six) hours as needed (mild pain). 11/28/21   Barrett, Rae Roam, PA-C   DG Hip Unilat With Pelvis 2-3 Views Right  Result Date: 04/27/2022 CLINICAL DATA:  Recent fall with right leg pain, initial encounter EXAM: DG HIP (WITH OR WITHOUT PELVIS) 2-3V RIGHT COMPARISON:  None Available. FINDINGS: Pelvic ring is intact. There is a mid right femoral neck fracture with impaction and angulation at the fracture site. Femoral head is well seated. Postsurgical changes are noted in the lower lumbar spine. IMPRESSION: Right femoral neck fracture with impaction and angulation at the fracture site. Electronically Signed   By: Alcide Clever M.D.   On: 04/27/2022 23:06    Positive ROS: All other systems have been reviewed and  were otherwise negative with the exception of those mentioned in the HPI and as above.  Objective: Labs cbc Recent Labs    04/27/22 2129  WBC 8.0  HGB 11.4*  HCT 36.0*  PLT 131*    Labs inflam No results for input(s): "CRP" in the last 72 hours.  Invalid input(s): "ESR"  Labs coag Recent Labs  04/27/22 2129  INR 1.3*    Recent Labs    04/27/22 2129  NA 141  K 4.3  CL 104  CO2 24  GLUCOSE 99  BUN 14  CREATININE 1.31*  CALCIUM 9.3    Physical Exam: Vitals:   04/28/22 0130 04/28/22 0212  BP: 113/69 136/66  Pulse: (!) 59 63  Resp: 14 16  Temp:  98.4 F (36.9 C)  SpO2: 96% 97%   General: Alert, no acute distress.  Laying supine on bed, calm Mental status: Alert and Oriented x3 Neurologic: Speech Clear and organized, no gross focal findings or movement disorder appreciated. Respiratory: No cyanosis, no use of accessory musculature Cardiovascular: No pedal edema GI: Abdomen is soft and non-tender, non-distended. Skin: Warm and dry.  No lesions in the area of chief complaint. Extremities: Warm and well perfused w/o edema Psychiatric: Patient is competent for consent with normal mood and affect  MUSCULOSKELETAL:  TTP right hip, limited ROM d/t pain, leg shortened and externally rotated, NVI Other extremities are atraumatic with painless ROM and NVI.  Assessment / Plan: Principal Problem:   Hip fracture Active Problems:   COPD (chronic obstructive pulmonary disease)   Primary adenocarcinoma of upper lobe of right lung   AKI (acute kidney injury)   Essential hypertension   PAF (paroxysmal atrial fibrillation)   History of stroke   Chronic combined systolic and diastolic CHF (congestive heart failure)   Normocytic anemia    Will plan to take the patient to the OR today for a right hip hemiarthroplasty vs. IMN. Keep NPO and hold Eliquis.   Weightbearing: NWB RLE VTE prophylaxis:  on hold for surgery   Pain control: PRN Follow - up plan: 2 weeks  post op Contact information:  Margarita Rana MD, [facility] PA-C  Jenne Pane PA-C Office [phone] 04/28/2022 7:53 AM

## 2022-04-28 NOTE — Transfer of Care (Signed)
Immediate Anesthesia Transfer of Care Note  Patient: Darryl Ward  Procedure(s) Performed: ARTHROPLASTY BIPOLAR HIP (HEMIARTHROPLASTY) (Right: Hip)  Patient Location: PACU  Anesthesia Type:General  Level of Consciousness: drowsy  Airway & Oxygen Therapy: Patient Spontanous Breathing and Patient connected to face mask oxygen  Post-op Assessment: Report given to RN and Post -op Vital signs reviewed and stable  Post vital signs: Reviewed and stable  Last Vitals:  Vitals Value Taken Time  BP 133/63 04/28/22 1233  Temp    Pulse 65 04/28/22 1238  Resp 16 04/28/22 1238  SpO2 100 % 04/28/22 1238  Vitals shown include unvalidated device data.  Last Pain:  Vitals:   04/28/22 0753  TempSrc: Oral  PainSc: 8          Complications: No notable events documented.

## 2022-04-28 NOTE — Assessment & Plan Note (Signed)
controlled 

## 2022-04-28 NOTE — Progress Notes (Signed)
Pt expressed the need to urinate. Offered urinal but unsuccessful. Bladder scan revealed 702 ml. In and out cath ordered by J.Garner Nash NP. 900 ml yellow clear urine output has documented. Pt tolerated the procedure well.

## 2022-04-28 NOTE — Assessment & Plan Note (Signed)
-   Stable.  Receiving gentle IV fluid due to AKI

## 2022-04-28 NOTE — Op Note (Signed)
04/27/2022 - 04/28/2022  11:42 AM  PATIENT:  Darryl Ward   MRN: [id number]  PRE-OPERATIVE DIAGNOSIS:  RIGHT HIP FRACTURE  POST-OPERATIVE DIAGNOSIS:  RIGHT HIP FRACTURE  PROCEDURE:  Procedure(s): ARTHROPLASTY BIPOLAR HIP (HEMIARTHROPLASTY)  PREOPERATIVE INDICATIONS:  Darryl Ward is an 77 y.o. male who was admitted 04/27/2022 with a diagnosis of Hip fracture and elected for surgical management.  The risks benefits and alternatives were discussed with the patient including but not limited to the risks of nonoperative treatment, versus surgical intervention including infection, bleeding, nerve injury, periprosthetic fracture, the need for revision surgery, dislocation, leg length discrepancy, blood clots, cardiopulmonary complications, morbidity, mortality, among others, and they were willing to proceed.  Predicted outcome is good, although there will be at least a six to nine month expected recovery.   OPERATIVE REPORT     SURGEON:  Margarita Rana, MD    ASSISTANT:  Levester Fresh, PA-C, he was present and scrubbed throughout the case, critical for completion in a timely fashion, and for retraction, instrumentation, and closure.     ANESTHESIA:  General    COMPLICATIONS:  None.      COMPONENTS:  [employer] Acolade: Femoral stem: 5, Femoral Head:52, Neck:-4   PROCEDURE IN DETAIL: The patient was met in the holding area and identified.  The appropriate hip  was marked at the operative site. The patient was then transported to the OR and  placed under general anesthesia.  At that point, the patient was  placed in the lateral decubitus position with the operative side up and  secured to the operating room table and all bony prominences padded.     The operative lower extremity was prepped from the iliac crest to the toes.  Sterile draping was performed.  Time out was performed prior to incision.      A routine posterolateral approach was utilized via sharp dissection  carried down to the  subcutaneous tissue.  Gross bleeders were Bovie  coagulated.  The iliotibial band was identified and incised  along the length of the skin incision.  Self-retaining retractors were  inserted. I examined the bursa there was significant hematoma and edema I performed a bursectomy here.  With the hip internally rotated, the short external rotators  were identified. The piriformis was tagged with FiberWire, and the hip capsule released in a T-type fashion.  The femoral neck was exposed, and I resected the femoral neck using the appropriate jig. This was performed at approximately a thumb's breadth above the lesser trochanter.    I then exposed the deep acetabulum, cleared out any tissue including the ligamentum teres, and included the hip capsule in the FiberWire used above and below the T.    I then prepared the proximal femur using the cookie-cutter, the lateralizing reamer, and then sequentially broached.  A trial utilized, and I reduced the hip and it was found to have excellent stability with functional range of motion. The trial components were then removed.   The canal and acetabulum were thoroughly irrigated  I inserted the pressfit stem and placed the head and neck collar. The hip was reduced with appropriate force and was stable through a range of motion.   I then used a 2 mm drill bits to pass the FiberWire suture from the capsule and puriform is through the greater trochanter, and secured this. Excellent posterior capsular repair was achieved. I also closed the T in the capsule.  I then irrigated the hip copiously again with pulse lavage, and  repaired the fascia with Vicryl, followed by Vicryl for the subcutaneous tissue, Monocryl for the skin, Steri-Strips and sterile gauze. The wounds were injected. The patient was then awakened and returned to PACU in stable and satisfactory condition. There were no complications.  POST-OP PLAN: Weight bearing as tolerated. DVT px will consist of SCD's  and chemical px  Margarita Rana, MD Orthopedic Surgeon [phone]   04/28/2022 11:42 AM

## 2022-04-28 NOTE — Assessment & Plan Note (Signed)
-   Currently holding Eliquis due to right hip fracture requiring fixation

## 2022-04-29 DIAGNOSIS — D649 Anemia, unspecified: Secondary | ICD-10-CM

## 2022-04-29 DIAGNOSIS — J449 Chronic obstructive pulmonary disease, unspecified: Secondary | ICD-10-CM | POA: Diagnosis not present

## 2022-04-29 DIAGNOSIS — N179 Acute kidney failure, unspecified: Secondary | ICD-10-CM | POA: Diagnosis not present

## 2022-04-29 DIAGNOSIS — C3411 Malignant neoplasm of upper lobe, right bronchus or lung: Secondary | ICD-10-CM

## 2022-04-29 DIAGNOSIS — I5042 Chronic combined systolic (congestive) and diastolic (congestive) heart failure: Secondary | ICD-10-CM

## 2022-04-29 DIAGNOSIS — S72001A Fracture of unspecified part of neck of right femur, initial encounter for closed fracture: Secondary | ICD-10-CM | POA: Diagnosis not present

## 2022-04-29 DIAGNOSIS — I10 Essential (primary) hypertension: Secondary | ICD-10-CM

## 2022-04-29 DIAGNOSIS — I480 Paroxysmal atrial fibrillation: Secondary | ICD-10-CM

## 2022-04-29 DIAGNOSIS — Z8673 Personal history of transient ischemic attack (TIA), and cerebral infarction without residual deficits: Secondary | ICD-10-CM

## 2022-04-29 MED ORDER — APIXABAN 5 MG PO TABS
5.0000 mg | ORAL_TABLET | Freq: Two times a day (BID) | ORAL | Status: DC
  Administered 2022-04-29 – 2022-05-01 (×5): 5 mg via ORAL
  Filled 2022-04-29 (×5): qty 1

## 2022-04-29 NOTE — Progress Notes (Signed)
    Subjective: Patient reports pain as mild to moderate.  Tolerating diet.  Urinating.   No CP, SOB.  Hasn't mobilized OOB with PT yet.  Objective:   VITALS:   Vitals:   04/28/22 1614 04/28/22 2053 04/29/22 0318 04/29/22 0809  BP: 123/65 117/64 114/64 (!) 110/57  Pulse: 67 71 74 74  Resp: 13 17 19 15   Temp:   (!) 97.5 F (36.4 C) 97.9 F (36.6 C)  TempSrc:   Oral Oral  SpO2: 100% 98%  100%  Weight:      Height:          Latest Ref Rng & Units 04/27/2022    9:29 PM 04/22/2022    9:17 AM 04/01/2022    9:11 AM  CBC  WBC 4.0 - 10.5 K/uL 8.0  6.8  7.0   Hemoglobin 13.0 - 17.0 g/dL 19.4  17.4  08.1   Hematocrit 39.0 - 52.0 % 36.0  33.3  33.1   Platelets 150 - 400 K/uL 131  126  142       Latest Ref Rng & Units 04/27/2022    9:29 PM 04/22/2022    9:17 AM 04/01/2022    9:11 AM  BMP  Glucose 70 - 99 mg/dL 99  96  448   BUN 8 - 23 mg/dL 14  22  14    Creatinine 0.61 - 1.24 mg/dL 1.85  6.31  4.97   Sodium 135 - 145 mmol/L 141  139  134   Potassium 3.5 - 5.1 mmol/L 4.3  3.5  3.8   Chloride 98 - 111 mmol/L 104  107  103   CO2 22 - 32 mmol/L 24  22  24    Calcium 8.9 - 10.3 mg/dL 9.3  8.9  8.5    Intake/Output      04/07 0701 04/08 0700 04/08 0701 04/09 0700   P.O. 120    I.V. (mL/kg) 1200 (15.1)    IV Piggyback 100    Total Intake(mL/kg) 1420 (17.9)    Urine (mL/kg/hr) 1400 (0.7)    Blood 300    Total Output 1700    Net -280            Physical Exam: General: NAD.  Sitting up in bed, calm, comfortable Resp: No increased wob Cardio: regular rate and rhythm ABD soft Neurologically intact MSK Neurovascularly intact Sensation intact distally Intact pulses distally Dorsiflexion/Plantar flexion intact Incision: dressing C/D/I   Assessment: 1 Day Post-Op  S/P Procedure(s) (LRB): ARTHROPLASTY BIPOLAR HIP (HEMIARTHROPLASTY) (Right) by Dr. Jewel Baize. [name] on 04/28/22  Principal Problem:   Hip fracture Active Problems:   COPD (chronic obstructive pulmonary  disease)   Primary adenocarcinoma of upper lobe of right lung   AKI (acute kidney injury)   Essential hypertension   PAF (paroxysmal atrial fibrillation)   History of stroke   Chronic combined systolic and diastolic CHF (congestive heart failure)   Normocytic anemia   Plan:  Advance diet Up with therapy Incentive Spirometry Elevate and Apply ice  Weightbearing: WBAT RLE Insicional and dressing care: Dressings left intact until follow-up and Reinforce dressings as needed Orthopedic device(s): None Showering: Keep dressing dry VTE prophylaxis:  ok to restart home dose Eliquis today  , SCDs, ambulation Pain control: PRN Follow - up plan: 2 weeks Contact information:  Margarita Rana MD, Levester Fresh PA-C  Dispo:  TBD based on PT evals.      Jenne Pane, PA-C Office [phone] 04/29/2022, 9:33 AM

## 2022-04-29 NOTE — Evaluation (Signed)
Occupational Therapy Evaluation Patient Details Name: Darryl Ward MRN: [id number] DOB: 04/05/1945 Today's Date: 04/29/2022   History of Present Illness 77 y.o. male [facility] on 04/27/2022 after a fall, with R hip pain. P found to have R femoral neck fx. Pt underwent R hip hemiarthroplasty on 4/7. PMH includes PAF, HTN, combined systolic and diastolic HF, AAA repair, CVA, COPD, adenocarcinoma of R lung.   Clinical Impression   Darryl Ward was evaluated s/p the above admission list. He is mod I and lives with a roommate at baseline. Upon evaluation he was limited by impaired cognition, R hip pain, posterior hip precautions, decreased activity tolerance and generalized weakness. Overall she required max A +2 for bed mobility and mod A +2 for transfer level mobility with RW and cues. Due to the deficits listed below he also requires max A A+2 for LB ADLs and up to min A for UB ADLs. Pt will benefit from continued acute OT services. Pt will benefit from skilled inpatient follow up therapy, <3 hours/day.       Recommendations for follow up therapy are one component of a multi-disciplinary discharge planning process, led by the attending physician.  Recommendations may be updated based on patient status, additional functional criteria and insurance authorization.   Assistance Recommended at Discharge Frequent or constant Supervision/Assistance  Patient can return home with the following A lot of help with walking and/or transfers;A lot of help with bathing/dressing/bathroom;Assistance with cooking/housework;Direct supervision/assist for medications management;Direct supervision/assist for financial management;Assist for transportation;Help with stairs or ramp for entrance    Functional Status Assessment  Patient has had a recent decline in their functional status and demonstrates the ability to make significant improvements in function in a reasonable and predictable amount of time.   Equipment Recommendations  BSC/3in1       Precautions / Restrictions Precautions Precautions: Fall;Posterior Hip Precaution Booklet Issued: Yes (comment) Precaution Comments: posted in room Restrictions Weight Bearing Restrictions: Yes RLE Weight Bearing: Weight bearing as tolerated      Mobility Bed Mobility Overal bed mobility: Needs Assistance Bed Mobility: Supine to Sit     Supine to sit: Max assist, +2 for physical assistance, HOB elevated     General bed mobility comments: pt with difficulty following verbal cues to mobilize hips in bed in an effort to maintain posterior hip precautions    Transfers Overall transfer level: Needs assistance Equipment used: Rolling walker (2 wheels) Transfers: Sit to/from Stand, Bed to chair/wheelchair/BSC Sit to Stand: Mod assist, +2 physical assistance, From elevated surface     Step pivot transfers: Mod assist, +2 physical assistance     General transfer comment: verbal cues for hand placement, assist to power up into standing      Balance Overall balance assessment: Needs assistance Sitting-balance support: No upper extremity supported, Feet supported Sitting balance-Leahy Scale: Fair Sitting balance - Comments: minA initially progressing to minG with time Postural control: Posterior lean Standing balance support: Bilateral upper extremity supported, Reliant on assistive device for balance Standing balance-Leahy Scale: Poor Standing balance comment: minA                           ADL either performed or assessed with clinical judgement   ADL Overall ADL's : Needs assistance/impaired Eating/Feeding: Independent;Sitting   Grooming: Set up;Sitting   Upper Body Bathing: Minimal assistance;Sitting   Lower Body Bathing: Maximal assistance;Sit to/from stand   Upper Body Dressing : Set up;Sitting  Lower Body Dressing: Maximal assistance;+2 for physical assistance;+2 for safety/equipment;Sit to/from stand    Toilet Transfer: Moderate assistance;+2 for physical assistance;+2 for safety/equipment;Stand-pivot;BSC/3in1;Rolling walker (2 wheels)   Toileting- Clothing Manipulation and Hygiene: Maximal assistance;+2 for physical assistance;+2 for safety/equipment;Sit to/from stand       Functional mobility during ADLs: Moderate assistance;Maximal assistance;+2 for physical assistance;+2 for safety/equipment General ADL Comments: limited by pain, precautions and cognition     Vision Baseline Vision/History: 1 Wears glasses Vision Assessment?: No apparent visual deficits Additional Comments: glasses not in room     Perception Perception Perception Tested?: No   Praxis Praxis Praxis tested?: Not tested    Pertinent Vitals/Pain Pain Assessment Pain Assessment: 0-10 Pain Score: 8  Pain Location: R hip Pain Descriptors / Indicators: Aching Pain Intervention(s): Limited activity within patient's tolerance, Monitored during session     Hand Dominance Right   Extremity/Trunk Assessment Upper Extremity Assessment Upper Extremity Assessment: Generalized weakness;Defer to OT evaluation   Lower Extremity Assessment Lower Extremity Assessment: Defer to PT evaluation RLE Deficits / Details: ankle ROM WFL, 4/5 PF/DF, hip ROM deferred due to posterior precautions. knee flexion/extension 4/5, hip strength assessment deferred due to pain   Cervical / Trunk Assessment Cervical / Trunk Assessment: Normal   Communication Communication Communication: HOH   Cognition Arousal/Alertness: Awake/alert Behavior During Therapy: WFL for tasks assessed/performed Overall Cognitive Status: Impaired/Different from baseline Area of Impairment: Orientation, Attention, Following commands, Memory, Safety/judgement, Awareness, Problem solving                 Orientation Level: Disoriented to, Time Current Attention Level: Sustained Memory: Decreased short-term memory, Decreased recall of  precautions Following Commands: Follows one step commands with increased time, Follows multi-step commands inconsistently, Follows one step commands inconsistently Safety/Judgement: Decreased awareness of safety, Decreased awareness of deficits Awareness: Intellectual Problem Solving: Slow processing, Difficulty sequencing, Requires verbal cues, Requires tactile cues General Comments: oriented to self, place and situation. States year as "3 37," and month as "August" consistently even after reorientation. Unable to recall hip precautions, even after review. Slowed proecessing and decrease insight noted throughout     General Comments  tachycardic up to 145 in standing, HR recovers quickly to 90s when seated and resting    Exercises     Shoulder Instructions      Home Living Family/patient expects to be discharged to:: Private residence Living Arrangements: Other (Comment) Available Help at Discharge: Other (Comment);Available 24 hours/day Type of Home: House Home Access: Stairs to enter [employer] of Steps: 2 Entrance Stairs-Rails: Can reach both Home Layout: One level     Bathroom Shower/Tub: Chief Strategy Officer: Standard     Home Equipment: Agricultural consultant (2 wheels);Cane - single point;Shower seat   Additional Comments: "[name]" roommate of 40 years      Prior Functioning/Environment Prior Level of Function : Independent/Modified Independent             Mobility Comments: PRN use of DME, fell when not using DME ADLs Comments: does not drive, roommate assists with IADLs        OT Problem List: Decreased strength;Decreased range of motion;Decreased activity tolerance;Impaired balance (sitting and/or standing);Decreased cognition;Decreased safety awareness;Decreased knowledge of use of DME or AE;Decreased knowledge of precautions;Pain      OT Treatment/Interventions: Self-care/ADL training;Therapeutic exercise;DME and/or AE  instruction;Therapeutic activities;Patient/family education;Balance training    OT Goals(Current goals can be found in the care plan section) Acute Rehab OT Goals Patient Stated Goal: less pain OT Goal  Formulation: With patient Time For Goal Achievement: 05/13/22 Potential to Achieve Goals: Good ADL Goals Pt Will Perform Grooming: with min guard assist;standing Pt Will Perform Lower Body Dressing: with min assist;with adaptive equipment;sit to/from stand Pt Will Transfer to Toilet: with min assist;ambulating;bedside commode Additional ADL Goal #1: Pt will indep recall posterior hip precautions  OT Frequency: Min 2X/week    Co-evaluation PT/OT/SLP Co-Evaluation/Treatment: Yes Reason for Co-Treatment: Complexity of the patient's impairments (multi-system involvement);To address functional/ADL transfers;Necessary to address cognition/behavior during functional activity;For patient/therapist safety PT goals addressed during session: Mobility/safety with mobility;Balance;Proper use of DME;Strengthening/ROM OT goals addressed during session: ADL's and self-care      AM-PAC OT "6 Clicks" Daily Activity     Outcome Measure Help from another person eating meals?: None Help from another person taking care of personal grooming?: A Little Help from another person toileting, which includes using toliet, bedpan, or urinal?: A Lot Help from another person bathing (including washing, rinsing, drying)?: A Lot Help from another person to put on and taking off regular upper body clothing?: A Little Help from another person to put on and taking off regular lower body clothing?: A Lot 6 Click Score: 16   End of Session Equipment Utilized During Treatment: Gait belt;Rolling walker (2 wheels) Nurse Communication: Mobility status  Activity Tolerance: Patient limited by pain Patient left: in chair;with call bell/phone within reach;with chair alarm set  OT Visit Diagnosis: Unsteadiness on feet  (R26.81);Other abnormalities of gait and mobility (R26.89);Muscle weakness (generalized) (M62.81);History of falling (Z91.81);Pain                Time: 0930-1000 OT Time Calculation (min): 30 min Charges:  OT General Charges $OT Visit: 1 Visit OT Evaluation $OT Eval Moderate Complexity: 1 Mod  Derenda Mis, OTR/L Acute Rehabilitation Services Office [phone] Secure Chat Communication Preferred   Donia Pounds 04/29/2022, 10:42 AM

## 2022-04-29 NOTE — Progress Notes (Signed)
PROGRESS NOTE    Darryl Ward  BBU:[id number] DOB: 1945-04-15 DOA: 04/27/2022 PCP: [name], DO    Brief Narrative:   Darryl Ward is a 77 y.o. male with past medical history significant of paroxysmal atrial fibrillation on Apixaban, hypertension, HTN, combined systolic and diastolic heart failure, s/p AAA repair, CVA, CAD, COPD, adenocarcinoma of the right lung on immunotherapy [facility] with fall after he tripped.  On Eliquis.  In the ED, hemoglobin was 11.4 with baseline around 10.  Has chronic thrombocytopenia at 131.  Creatinine was 1.3 from previous 0.9.  X-ray of the hip showed a right femoral fracture with impaction and angulation.  Orthopedics was notified from the ED and patient was at the hospital for further evaluation and treatment.  Assessment and Plan: Right hip fracture Orthopedics on board.  Status post right hip hemiarthroplasty on 04/28/2022.  Continue care as per orthopedics.  Orthopedic recommends weightbearing as tolerated on the right lower extremity and follow-up with Dr. [name] in 2 weeks.   Normocytic anemia Hemoglobin of 11.4. Recently received Feraheme with oncology.  Will be resuming Eliquis today.   Chronic combined systolic and diastolic CHF (congestive heart failure) Patient received gentle IV fluids.  Currently compensated.  On Lasix at 20 mg daily as needed at home.  Will resume tomorrow if creatinine improved by tomorrow.  Check BMP in AM.   History of stroke Currently Eliquis on hold due to right hip fracture surgery..  Orthopedic surgery okay with resuming Eliquis today.  PAF (paroxysmal atrial fibrillation) Rate controlled.  On metoprolol.  Resume Eliquis today.   Essential hypertension Controlled.  Patient is on metoprolol from home.  Blood pressure is stable.  AKI (acute kidney injury) -creatinine of 1.31 up from prior of 0.98.  Received IV fluids.  Check BMP in AM.   Primary adenocarcinoma of upper lobe of right lung -has  underwent 3 cycles of pembrolizumab. Last cycle on 04/22/22. Follows up with Dr. Ellin Saba with [facility] currently appears to be stable.   COPD (chronic obstructive pulmonary disease) - Stable, not in exacerbation continue bronchodilators.     DVT prophylaxis: SCDs Start: 04/28/22 1521 Place TED hose Start: 04/28/22 1521   Code Status:     Code Status: Full Code  Disposition: Need for rehabilitation, likely at the skilled nursing facility.  Status is: Inpatient  Remains inpatient appropriate because: Status post surgery, need for rehabilitation.   Family Communication: I spoke with the patient's significant other on the phone and updated her about the clinical condition of the patient.  Consultants:  Orthopedics.  Procedures:  Right hip hemiarthroplasty on 04/28/2022.    Antimicrobials:  None  Anti-infectives (From admission, onward)    Start     Dose/Rate Route Frequency Ordered Stop   04/28/22 1615  ceFAZolin (ANCEF) IVPB 2g/100 mL premix        2 g 200 mL/hr over 30 Minutes Intravenous Every 6 hours 04/28/22 1520 04/28/22 2255   04/28/22 0830  ceFAZolin (ANCEF) IVPB 2g/100 mL premix        2 g 200 mL/hr over 30 Minutes Intravenous On call to O.R. 04/28/22 0815 04/28/22 1104   04/28/22 0823  ceFAZolin (ANCEF) 2-4 GM/100ML-% IVPB       Note to Pharmacy: Crissie Sickles: cabinet override      04/28/22 0823 04/28/22 1108      Subjective: Today, patient was seen and examined at bedside.  Patient denies obvious pain, nausea, vomiting, fever, chills  or rigor.  Has mild soreness at the area.  Objective: Vitals:   04/28/22 1614 04/28/22 2053 04/29/22 0318 04/29/22 0809  BP: 123/65 117/64 114/64 (!) 110/57  Pulse: 67 71 74 74  Resp: 13 17 19 15   Temp:   (!) 97.5 F (36.4 C) 97.9 F (36.6 C)  TempSrc:   Oral Oral  SpO2: 100% 98%  100%  Weight:      Height:        Intake/Output Summary (Last 24 hours) at 04/29/2022 1149 Last data filed at 04/29/2022  0559 Gross per 24 hour  Intake 320 ml  Output 1400 ml  Net -1080 ml   Filed Weights   04/27/22 2126 04/28/22 0840  Weight: 79.4 kg 79.4 kg    Physical Examination: Body mass index is 25.11 kg/m.   General:  Average built, not in obvious distress HENT:   No scleral pallor or icterus noted. Oral mucosa is moist.  Chest:   Diminished breath sounds bilaterally. No crackles or wheezes.  CVS: S1 &S2 heard. No murmur.  Regular rate and rhythm. Abdomen: Soft, nontender, nondistended.  Bowel sounds are heard.   Extremities: No cyanosis, clubbing or edema.  Peripheral pulses are palpable.  Right hip surgical site healthy. Psych: Alert, awake and oriented, normal mood CNS:  No cranial nerve deficits.  Power equal in all extremities.   Skin: Warm and dry,  Data Reviewed:   CBC: Recent Labs  Lab 04/27/22 2129  WBC 8.0  NEUTROABS 5.4  HGB 11.4*  HCT 36.0*  MCV 96.0  PLT 131*    Basic Metabolic Panel: Recent Labs  Lab 04/27/22 2129  NA 141  K 4.3  CL 104  CO2 24  GLUCOSE 99  BUN 14  CREATININE 1.31*  CALCIUM 9.3    Liver Function Tests: No results for input(s): "AST", "ALT", "ALKPHOS", "BILITOT", "PROT", "ALBUMIN" in the last 168 hours.   Radiology Studies: Pelvis Portable  Result Date: 04/28/2022 CLINICAL DATA:  Right hip hemiarthroplasty. EXAM: PORTABLE PELVIS 1-2 VIEWS COMPARISON:  Preoperative radiograph FINDINGS: Right hip hemi arthroplasty in expected alignment. No periprosthetic lucency or fracture. Recent postsurgical change includes air and edema in the soft tissues. Lower lumbar surgical hardware is partially included. IMPRESSION: Right hip hemi arthroplasty without immediate postoperative complication. Electronically Signed   By: Narda Rutherford M.D.   On: 04/28/2022 13:14   DG Hip Unilat With Pelvis 2-3 Views Right  Result Date: 04/27/2022 CLINICAL DATA:  Recent fall with right leg pain, initial encounter EXAM: DG HIP (WITH OR WITHOUT PELVIS) 2-3V RIGHT  COMPARISON:  None Available. FINDINGS: Pelvic ring is intact. There is a mid right femoral neck fracture with impaction and angulation at the fracture site. Femoral head is well seated. Postsurgical changes are noted in the lower lumbar spine. IMPRESSION: Right femoral neck fracture with impaction and angulation at the fracture site. Electronically Signed   By: Alcide Clever M.D.   On: 04/27/2022 23:06      LOS: 1 day    Joycelyn Das, MD [facility] Available via Epic secure chat 7am-7pm After these hours, please refer to coverage provider listed on [email or web address] 04/29/2022, 11:49 AM

## 2022-04-29 NOTE — Progress Notes (Signed)
Inpatient Rehab Admissions Coordinator:   Per family request pt was screened for CIR by Estill Dooms, PT, DPT.  At this time, HTA unlikely to approve CIR given diagnosis of hip fracture and recommendations for SNF from therapy.  No consult recommended at this time.   Estill Dooms, PT, DPT Admissions Coordinator [phone] 04/29/22  1:38 PM

## 2022-04-29 NOTE — NC FL2 (Signed)
[place] MEDICAID FL2 LEVEL OF CARE FORM     IDENTIFICATION  Patient Name: Darryl Ward Birthdate: April 10, 1945 Sex: male Admission Date (Current Location): 04/27/2022  [facility] and [place] Number:  Producer, television/film/video and Address:  [facility] [place] [facility], [address], [place], [place] [address]      Provider Number: [id number]  Attending Physician Name and Address:  Joycelyn Das, MD  Relative Name and Phone Number:  Smith,[name] Significant other [phone]    Current Level of Care: Hospital Recommended Level of Care: Skilled Nursing Facility Prior Approval Number:    Date Approved/Denied:   PASRR Number:    Discharge Plan: SNF    Current Diagnoses: Patient Active Problem List   Diagnosis Date Noted   Hip fracture 04/28/2022   Normocytic anemia 04/28/2022   Iron deficiency anemia 04/22/2022   History of stroke 03/27/2022   Chronic combined systolic and diastolic CHF (congestive heart failure) 03/27/2022   S/P AAA repair 03/27/2022   PAF (paroxysmal atrial fibrillation) 12/23/2021   AKI (acute kidney injury) 09/30/2021   Essential hypertension 09/30/2021   Abnormal PET scan of colon 07/12/2021   Primary adenocarcinoma of upper lobe of right lung 06/26/2021   COPD (chronic obstructive pulmonary disease) 05/29/2021   Lumbar spinal stenosis 02/06/2020   Meningioma, cerebral 02/05/2020   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   Chronic midline low back pain without sciatica 04/27/2019   PVD (peripheral vascular disease) (HCC) 05/21/2012   Mixed hyperlipidemia 12/13/2011   Coronary atherosclerosis of native coronary artery 12/13/2011    Orientation RESPIRATION BLADDER Height & Weight     Self, Situation, Place  Normal Incontinent Weight: 175 lb (79.4 kg) Height:  5\' 10"  (177.8 cm)  BEHAVIORAL SYMPTOMS/MOOD NEUROLOGICAL BOWEL NUTRITION STATUS      Continent Diet (see discharge summary)  AMBULATORY STATUS COMMUNICATION OF NEEDS Skin   Total Care  Verbally Surgical wounds                       Personal Care Assistance Level of Assistance  Bathing, Feeding, Dressing Bathing Assistance: Maximum assistance Feeding assistance: Independent Dressing Assistance: Maximum assistance     Functional Limitations Info  Sight, Hearing, Speech Sight Info: Adequate Hearing Info: Impaired Speech Info: Adequate    SPECIAL CARE FACTORS FREQUENCY  PT (By licensed PT), OT (By licensed OT)     PT Frequency: 5x week OT Frequency: 5x week            Contractures Contractures Info: Not present    Additional Factors Info  Code Status, Allergies Code Status Info: full Allergies Info: NKA           Current Medications (04/29/2022):  This is the current hospital active medication list Current Facility-Administered Medications  Medication Dose Route Frequency Provider Last Rate Last Admin   acetaminophen (TYLENOL) tablet 325-650 mg  325-650 mg Oral Q6H PRN Gawne, Meghan M, PA-C       acetaminophen (TYLENOL) tablet 500 mg  500 mg Oral Q6H Gawne, Meghan M, PA-C   500 mg at 04/29/22 1320   albuterol (PROVENTIL) (2.5 MG/3ML) 0.083% nebulizer solution 2.5 mg  2.5 mg Inhalation Q6H PRN Gawne, Meghan M, PA-C       alum & mag hydroxide-simeth (MAALOX/MYLANTA) 200-200-20 MG/5ML suspension 30 mL  30 mL Oral Q4H PRN Gawne, Meghan M, PA-C       apixaban (ELIQUIS) tablet 5 mg  5 mg Oral BID Pokhrel, Laxman, MD   5  mg at 04/29/22 1320   atorvastatin (LIPITOR) tablet 80 mg  80 mg Oral Daily Levester Fresh M, PA-C   80 mg at 04/29/22 3976   bisacodyl (DULCOLAX) suppository 10 mg  10 mg Rectal Daily PRN Gawne, Meghan M, PA-C       Chlorhexidine Gluconate Cloth 2 % PADS 6 each  6 each Topical Daily Pokhrel, Laxman, MD   6 each at 04/29/22 0857   docusate sodium (COLACE) capsule 100 mg  100 mg Oral BID Levester Fresh M, PA-C   100 mg at 04/29/22 0856   escitalopram (LEXAPRO) tablet 10 mg  10 mg Oral Daily Levester Fresh M, PA-C   10 mg at 04/29/22 7341    folic acid (FOLVITE) tablet 1 mg  1 mg Oral Daily Levester Fresh M, PA-C   1 mg at 04/29/22 0856   HYDROcodone-acetaminophen (NORCO) 7.5-325 MG per tablet 1-2 tablet  1-2 tablet Oral Q4H PRN Jenne Pane, PA-C   1 tablet at 04/29/22 1320   HYDROcodone-acetaminophen (NORCO/VICODIN) 5-325 MG per tablet 1-2 tablet  1-2 tablet Oral Q4H PRN Jenne Pane, PA-C   2 tablet at 04/29/22 0856   magnesium oxide (MAG-OX) tablet 400 mg  400 mg Oral Daily Levester Fresh M, PA-C   400 mg at 04/29/22 9379   menthol-cetylpyridinium (CEPACOL) lozenge 3 mg  1 lozenge Oral PRN Levester Fresh M, PA-C       Or   phenol (CHLORASEPTIC) mouth spray 1 spray  1 spray Mouth/Throat PRN Gawne, Lindie Spruce M, PA-C       methocarbamol (ROBAXIN) tablet 500 mg  500 mg Oral Q6H PRN Gawne, Meghan M, PA-C       Or   methocarbamol (ROBAXIN) 500 mg in dextrose 5 % 50 mL IVPB  500 mg Intravenous Q6H PRN Gawne, Meghan M, PA-C       metoCLOPramide (REGLAN) tablet 5-10 mg  5-10 mg Oral Q8H PRN Gawne, Meghan M, PA-C       Or   metoCLOPramide (REGLAN) injection 5-10 mg  5-10 mg Intravenous Q8H PRN Gawne, Meghan M, PA-C       metoprolol succinate (TOPROL-XL) 24 hr tablet 75 mg  75 mg Oral Daily Levester Fresh M, PA-C   75 mg at 04/29/22 0856   morphine (PF) 2 MG/ML injection 0.5-1 mg  0.5-1 mg Intravenous Q2H PRN Jenne Pane, PA-C   0.5 mg at 04/29/22 0323   ondansetron (ZOFRAN) tablet 4 mg  4 mg Oral Q6H PRN Levester Fresh M, PA-C       Or   ondansetron [facility]) injection 4 mg  4 mg Intravenous Q6H PRN Gawne, Meghan M, PA-C       pantoprazole (PROTONIX) EC tablet 40 mg  40 mg Oral Daily Levester Fresh M, PA-C   40 mg at 04/29/22 0856   polyethylene glycol (MIRALAX / GLYCOLAX) packet 17 g  17 g Oral Daily PRN Gawne, Meghan M, PA-C       umeclidinium bromide (INCRUSE ELLIPTA) 62.5 MCG/ACT 1 puff  1 puff Inhalation Daily Jenne Pane, PA-C         Discharge Medications: Please see discharge summary for a list of discharge  medications.  Relevant Imaging Results:  Relevant Lab Results:   Additional Information SSN: [id number]  Lorri Frederick, LCSW

## 2022-04-29 NOTE — TOC Initial Note (Signed)
Transition of Care [facility]) - Initial/Assessment Note    Patient Details  Name: Darryl Ward MRN: [id number] Date of Birth: Dec 25, 1945  Transition of Care [facility]) CM/SW Contact:    Lorri Frederick, LCSW Phone Number: 04/29/2022, 2:08 PM  Clinical Narrative:   Pt oriented x3, CSW spoke with pt in room regarding DC recommendation for SNF.  Pt asked CSW to speak with Darryl Ward about the plan.  Spoke to her by phone.  She requested pt be evaluated for CIR, reached out to CIR who reports pt is not a candidate. (See note)  Spoke with [name] again and she is agreeable to SNF, requesting [facility].  Pt is from home with [name], no current services.  Referral sent out for SNF, reached out to [facility].                Expected Discharge Plan: Skilled Nursing Facility Barriers to Discharge: Continued Medical Work up, SNF Pending bed offer   Patient Goals and CMS Choice Patient states their goals for this hospitalization and ongoing recovery are:: walking again CMS Medicare.gov Compare Post Acute Care list provided to:: Patient Represenative (must comment) Katharine Look) Choice offered to / list presented to : [facility] POA / Guardian      Expected Discharge Plan and Services In-house Referral: Clinical Social Work   Post Acute Care Choice: Skilled Nursing Facility Living arrangements for the past 2 months: Single Family Home                                      Prior Living Arrangements/Services Living arrangements for the past 2 months: Single Family Home Lives with:: Significant Other Darryl Ward) Patient language and need for interpreter reviewed:: Yes Do you feel safe going back to the place where you live?: Yes      Need for Family Participation in Patient Care: Yes (Comment) Care giver support system in place?: Yes (comment) Current home services: Other (comment) (none) Criminal Activity/Legal Involvement Pertinent to Current Situation/Hospitalization: No -  Comment as needed  Activities of Daily Living      Permission Sought/Granted Permission sought to share information with : Family Supports Permission granted to share information with : Yes, Verbal Permission Granted  Share Information with NAME: Darryl Ward  Permission granted to share info w AGENCY: SNF        Emotional Assessment Appearance:: Appears stated age Attitude/Demeanor/Rapport: Engaged Affect (typically observed): Pleasant Orientation: : Oriented to Self, Oriented to Place, Oriented to Situation      Admission diagnosis:  Hip fracture [S72.009A] Closed fracture of right hip, initial encounter [S72.001A] Patient Active Problem List   Diagnosis Date Noted   Hip fracture 04/28/2022   Normocytic anemia 04/28/2022   Iron deficiency anemia 04/22/2022   History of stroke 03/27/2022   Chronic combined systolic and diastolic CHF (congestive heart failure) 03/27/2022   S/P AAA repair 03/27/2022   PAF (paroxysmal atrial fibrillation) 12/23/2021   AKI (acute kidney injury) 09/30/2021   Essential hypertension 09/30/2021   Abnormal PET scan of colon 07/12/2021   Primary adenocarcinoma of upper lobe of right lung 06/26/2021   COPD (chronic obstructive pulmonary disease) 05/29/2021   Lumbar spinal stenosis 02/06/2020   Meningioma, cerebral 02/05/2020   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 [place] [address] Phone: [phone] Fax: [phone]     Social Determinants of Health (SDOH) Social History: SDOH Screenings   Food Insecurity: No Food Insecurity (11/29/2021)  Housing: Low Risk  (11/29/2021)  Transportation Needs: No Transportation Needs  (11/29/2021)  Utilities: Not At Risk (11/29/2021)  Depression (PHQ2-9): Medium Risk (03/27/2022)  Financial Resource Strain: Low Risk  (11/29/2019)  Physical Activity: Inactive (11/29/2019)  Social Connections: Moderately Isolated (11/29/2019)  Stress: No Stress Concern Present (11/29/2019)  Tobacco Use: Medium Risk (04/28/2022)   SDOH Interventions:     Readmission Risk Interventions     No data to display

## 2022-04-29 NOTE — Progress Notes (Signed)
RE:  Darryl Ward       Date of Birth: 06/26/1945      Date:   04/29/22       To Whom It May Concern:  Please be advised that the above-named patient will require a short-term nursing home stay - anticipated 30 days or less for rehabilitation and strengthening.  The plan is for return home.                 MD signature                Date

## 2022-04-29 NOTE — Progress Notes (Signed)
Mobility Specialist Progress Note   04/29/22 1050  Mobility  Activity Transferred from chair to bed  Level of Assistance +2 (takes two people)  Assistive Device Other (Comment) (HHA)  Range of Motion/Exercises Active;All extremities  RLE Weight Bearing WBAT   Patient received in recliner requesting assistance back to bed. Required mod A +2 to stand and max A +2 to safely step/pivot to bed. Also needed max cues to maintain posterior hip precautions. Was able to complete bed mobility with min A from sit>supine. Tolerated without complaint or incident. Was left in bed with all needs met, call bell in reach.   [place] Chinedum Vanhouten, BS EXP Mobility Specialist Please contact via SecureChat or Rehab office at [phone]

## 2022-04-29 NOTE — TOC CAGE-AID Note (Signed)
Transition of Care [facility]) - CAGE-AID Screening  Patient Details  Name: Darryl Ward MRN: [id number] Date of Birth: 04-11-1945  Clinical Narrative:  Patient to ED after a fall causing a hip fracture. Patient denies any alcohol or drug use, no need for substance abuse counseling at this time.  CAGE-AID Screening:   Have You Ever Felt You Ought to Cut Down on Your Drinking or Drug Use?: No Have People Annoyed You By Critizing Your Drinking Or Drug Use?: No Have You Felt Bad Or Guilty About Your Drinking Or Drug Use?: No Have You Ever Had a Drink or Used Drugs First Thing In The Morning to Steady Your Nerves or to Get Rid of a Hangover?: No CAGE-AID Score: 0  Substance Abuse Education Offered: No

## 2022-04-29 NOTE — Evaluation (Signed)
Clinical/Bedside Swallow Evaluation Patient Details  Name: Darryl Ward MRN: [id number] Date of Birth: Apr 27, 1945  Today's Date: 04/29/2022 Time: SLP Start Time (ACUTE ONLY): 1458 SLP Stop Time (ACUTE ONLY): 1509 SLP Time Calculation (min) (ACUTE ONLY): 11 min  Past Medical History:  Past Medical History:  Diagnosis Date   AAA (abdominal aortic aneurysm)    Arthritis    Cancer    Carotid artery disease    Nonobstructive   Cataract    COPD (chronic obstructive pulmonary disease)    Coronary atherosclerosis of native coronary artery    PTCA small diagonal 2007 otherwise nonobstructive CAD   Depression    Dysrhythmia    Essential hypertension, benign    Hyperlipidemia    NSTEMI (non-ST elevated myocardial infarction) 2007   Stroke 2004   TIA (transient ischemic attack) 2006   Past Surgical History:  Past Surgical History:  Procedure Laterality Date   AORTA - BILATERAL FEMORAL ARTERY BYPASS GRAFT  01/08/2012   Procedure: AORTA BIFEMORAL BYPASS GRAFT;  Surgeon: Sherren Kerns, MD;  Location: [facility] OR;  Service: Vascular;  Laterality: Bilateral;  using 18x35mm x 40cm Hemashield Gold Vascular Graft with Endarterectomy, Thombectomy and  Reimplantation of Inferior Mesenteric Artery   BACK SURGERY  2021   BRONCHIAL BIOPSY  06/11/2021   Procedure: BRONCHIAL BIOPSIES;  Surgeon: Leslye Peer, MD;  Location: [facility] ENDOSCOPY;  Service: Pulmonary;;   BRONCHIAL BRUSHINGS  06/11/2021   Procedure: BRONCHIAL BRUSHINGS;  Surgeon: Leslye Peer, MD;  Location: [facility] ENDOSCOPY;  Service: Pulmonary;;   BRONCHIAL NEEDLE ASPIRATION BIOPSY  06/11/2021   Procedure: BRONCHIAL NEEDLE ASPIRATION BIOPSIES;  Surgeon: Leslye Peer, MD;  Location: [facility] ENDOSCOPY;  Service: Pulmonary;;   CARDIOVERSION N/A 09/20/2021   Procedure: CARDIOVERSION;  Surgeon: [name], MD;  Location: [facility] ORS;  Service: Cardiovascular;  Laterality: N/A;   COLONOSCOPY N/A 04/14/2019   Procedure: COLONOSCOPY;  Surgeon: Corbin Ade, MD;  Location: [facility] ENDO SUITE;  Service: Endoscopy;  Laterality: N/A;  9:30   COLONOSCOPY WITH PROPOFOL N/A 07/25/2021   Procedure: COLONOSCOPY WITH PROPOFOL;  Surgeon: Lanelle Bal, DO;  Location: [facility] ENDO SUITE;  Service: Endoscopy;  Laterality: N/A;  1:00pm   FIDUCIAL MARKER PLACEMENT  06/11/2021   Procedure: FIDUCIAL MARKER PLACEMENT;  Surgeon: Leslye Peer, MD;  Location: [facility] ENDOSCOPY;  Service: Pulmonary;;   INTERCOSTAL NERVE BLOCK Right 11/12/2021   Procedure: INTERCOSTAL NERVE BLOCK;  Surgeon: Loreli Slot, MD;  Location: [facility] OR;  Service: Thoracic;  Laterality: Right;   IR IMAGING GUIDED PORT INSERTION  07/31/2021   Left cataract surgery     LYMPH NODE DISSECTION Right 11/12/2021   Procedure: LYMPH NODE DISSECTION;  Surgeon: Loreli Slot, MD;  Location: [facility] OR;  Service: Thoracic;  Laterality: Right;   POLYPECTOMY  04/14/2019   Procedure: POLYPECTOMY;  Surgeon: Corbin Ade, MD;  [facility] END[facility];  Service: Endoscopy;;   POLYPECTOMY  07/25/2021   Procedure: POLYPECTOMY;  Surgeon: Lanelle Bal, DO;  [facility] ENDO SUITE;  Service: Endoscopy;;   TEE WITHOUT CARDIOVERSION N/A 09/20/2021   Procedure: TRANSESOPHAGEAL ECHOCARDIOGRAM (TEE);  Surgeon: [name], MD;  Location: [facility] ORS;  Service: Cardiovascular;  Laterality: N/A;   TRANSFORAMINAL LUMBAR INTERBODY FUSION (TLIF) WITH PEDICLE SCREW FIXATION 1 LEVEL N/A 04/27/2020   Procedure: Transforaminal Lumbar Interbody Fusion Lumbar Five-Sacral One;  Surgeon: Bedelia Person, MD;  Location: [facility] OR;  Service: Neurosurgery;  Laterality: N/A;   VIDEO BRONCHOSCOPY WITH INSERTION OF  INTERBRONCHIAL VALVE (IBV) N/A 11/22/2021   Procedure: VIDEO BRONCHOSCOPY WITH INSERTION OF INTERBRONCHIAL VALVE (IBV);  Surgeon: Loreli Slot, MD;  Location: [facility] OR;  Service: Thoracic;  Laterality: N/A;   VIDEO BRONCHOSCOPY WITH INSERTION OF INTERBRONCHIAL VALVE (IBV) N/A 01/24/2022   Procedure: VIDEO  BRONCHOSCOPY WITH REMOVAL OF INTERBRONCHIAL VALVE (IBV);  Surgeon: Loreli Slot, MD;  Location: [facility] OR;  Service: Thoracic;  Laterality: N/A;   VIDEO BRONCHOSCOPY WITH RADIAL ENDOBRONCHIAL ULTRASOUND  06/11/2021   Procedure: VIDEO BRONCHOSCOPY WITH RADIAL ENDOBRONCHIAL ULTRASOUND;  Surgeon: Leslye Peer, MD;  Location: [facility] ENDOSCOPY;  Service: Pulmonary;;   HPI:  77 y.o. male presents to [facility] hospital on 04/27/2022 after a fall, with R hip pain. P found to have R femoral neck fx. Pt underwent R hip hemiarthroplasty on 4/7. PMH includes PAF, HTN, combined systolic and diastolic HF, AAA repair, CVA, COPD, adenocarcinoma of R lung.    Assessment / Plan / Recommendation  Clinical Impression  Pt presents with normal oropharyngeal swallow with thorough mastication, the appearance of a brisk swallow response, no s/s of aspiration with sequential sips of thin liquid and mixed consistencies.  He reports no prior nor current swallowing deficits. Continue regular solids/thin liquids. Pt did present with some response delays, intermittent recall of post-surgical precautions - if mentation does not clear in next 24 hours, may benefit from cognitive evaluation. SLP Visit Diagnosis: Dysphagia, unspecified (R13.10)    Aspiration Risk  No limitations    Diet Recommendation   Regular solids, thin liquids  Medication Administration: Whole meds with liquid    Other  Recommendations Oral Care Recommendations: Oral care BID    Recommendations for follow up therapy are one component of a multi-disciplinary discharge planning process, led by the attending physician.  Recommendations may be updated based on patient status, additional functional criteria and insurance authorization.  Follow up Recommendations No SLP follow up for swallowing; consider cognitive assessment if MS does not clear 24-48 hours.       Swallow Study   General HPI: 77 y.o. male presents to [facility] hospital on 04/27/2022 after a fall, with  R hip pain. P found to have R femoral neck fx. Pt underwent R hip hemiarthroplasty on 4/7. PMH includes PAF, HTN, combined systolic and diastolic HF, AAA repair, CVA, COPD, adenocarcinoma of R lung. Type of Study: Bedside Swallow Evaluation Previous Swallow Assessment: no Diet Prior to this Study: Regular;Thin liquids (Level 0) Temperature Spikes Noted: No Respiratory Status: Room air History of Recent Intubation: Yes Total duration of intubation (days):  (for procedure) Behavior/Cognition: Cooperative;Alert Oral Cavity Assessment: Within Functional Limits Oral Care Completed by SLP: No Oral Cavity - Dentition: Dentures, bottom;Dentures, top Vision: Functional for self-feeding Self-Feeding Abilities: Able to feed self Patient Positioning: Upright in bed Baseline Vocal Quality: Normal Volitional Cough: Strong Volitional Swallow: Able to elicit    Oral/Motor/Sensory Function Overall Oral Motor/Sensory Function: Within functional limits   Ice Chips Ice chips: Within functional limits   Thin Liquid Thin Liquid: Within functional limits    Nectar Thick Nectar Thick Liquid: Not tested   Honey Thick Honey Thick Liquid: Not tested   Puree Puree: Not tested   Solid     Solid: Within functional limits      Blenda Mounts Laurice 04/29/2022,3:11 PM  Marchelle Folks L. Samson Frederic, MA CCC/SLP Clinical Specialist - Acute Care SLP Acute Rehabilitation Services Office number [phone]

## 2022-04-29 NOTE — Evaluation (Signed)
Physical Therapy Evaluation Patient Details Name: Darryl Ward MRN: [id number] DOB: 1945/11/26 Today's Date: 04/29/2022  History of Present Illness  77 y.o. male presents to [facility] hospital on 04/27/2022 after a fall, with R hip pain. P found to have R femoral neck fx. Pt underwent R hip hemiarthroplasty on 4/7. PMH includes PAF, HTN, combined systolic and diastolic HF, AAA repair, CVA, COPD, adenocarcinoma of R lung.  Clinical Impression  Pt presents to PT with deficits in functional mobility, gait, balance, endurance, strength, power, cognition. Pt with slowed processing and difficult following cues to maintain posterior hip precautions during session. Pt with R hip pain and weakness, currently requiring physical assistance for all functional mobility tasks. Pt will benefit from continued acute PT services in an effort to improve mobility quality and to reduce falls risk. PT recommends short term inpatient PT services at the time of discharge.       Recommendations for follow up therapy are one component of a multi-disciplinary discharge planning process, led by the attending physician.  Recommendations may be updated based on patient status, additional functional criteria and insurance authorization.  Follow Up Recommendations Can patient physically be transported by private vehicle: No     Assistance Recommended at Discharge Frequent or constant Supervision/Assistance  Patient can return home with the following  A lot of help with walking and/or transfers;A lot of help with bathing/dressing/bathroom;Assistance with cooking/housework;Direct supervision/assist for financial management;Direct supervision/assist for medications management;Assist for transportation;Help with stairs or ramp for entrance    Equipment Recommendations Wheelchair (measurements PT)  Recommendations for Other Services       Functional Status Assessment Patient has had a recent decline in their functional status and  demonstrates the ability to make significant improvements in function in a reasonable and predictable amount of time.     Precautions / Restrictions Precautions Precautions: Fall;Posterior Hip Precaution Booklet Issued: Yes (comment) Restrictions Weight Bearing Restrictions: Yes RLE Weight Bearing: Weight bearing as tolerated      Mobility  Bed Mobility Overal bed mobility: Needs Assistance Bed Mobility: Supine to Sit     Supine to sit: Max assist, +2 for physical assistance, HOB elevated     General bed mobility comments: pt with difficulty following verbal cues to mobilize hips in bed in an effort to maintain posterior hip precautions    Transfers Overall transfer level: Needs assistance Equipment used: Rolling walker (2 wheels) Transfers: Sit to/from Stand, Bed to chair/wheelchair/BSC Sit to Stand: Mod assist, +2 physical assistance, From elevated surface   Step pivot transfers: Mod assist, +2 physical assistance       General transfer comment: verbal cues for hand placement, assist to power up into standing    Ambulation/Gait                  Stairs            Wheelchair Mobility    Modified Rankin (Stroke Patients Only)       Balance Overall balance assessment: Needs assistance Sitting-balance support: No upper extremity supported, Feet supported Sitting balance-Leahy Scale: Fair Sitting balance - Comments: minA initially progressing to minG with time Postural control: Posterior lean Standing balance support: Bilateral upper extremity supported, Reliant on assistive device for balance Standing balance-Leahy Scale: Poor Standing balance comment: minA                             Pertinent Vitals/Pain Pain Assessment Pain Assessment: 0-10 Pain Score:  8  Pain Location: R hip Pain Descriptors / Indicators: Aching Pain Intervention(s): Monitored during session    Home Living Family/patient expects to be discharged to::  Private residence Living Arrangements: Other (Comment) (roommate) Available Help at Discharge: Other (Comment);Available 24 hours/day (roommate) Type of Home: House Home Access: Stairs to enter Entrance Stairs-Rails: Can reach both Entrance Stairs-Number of Steps: 2   Home Layout: One level Home Equipment: Agricultural consultant (2 wheels);Cane - single point;Shower seat      Prior Function Prior Level of Function : Independent/Modified Independent             Mobility Comments: PRN use of DME, fell when not using DME       Hand Dominance   Dominant Hand: Right    Extremity/Trunk Assessment   Upper Extremity Assessment Upper Extremity Assessment: Defer to OT evaluation    Lower Extremity Assessment Lower Extremity Assessment: RLE deficits/detail RLE Deficits / Details: ankle ROM WFL, 4/5 PF/DF, hip ROM deferred due to posterior precautions. knee flexion/extension 4/5, hip strength assessment deferred due to pain    Cervical / Trunk Assessment Cervical / Trunk Assessment: Normal  Communication   Communication: HOH  Cognition Arousal/Alertness: Awake/alert Behavior During Therapy: WFL for tasks assessed/performed Overall Cognitive Status: Impaired/Different from baseline Area of Impairment: Orientation, Attention, Following commands, Memory, Safety/judgement, Awareness, Problem solving                 Orientation Level: Disoriented to, Time Current Attention Level: Sustained Memory: Decreased short-term memory, Decreased recall of precautions Following Commands: Follows one step commands with increased time, Follows multi-step commands inconsistently, Follows one step commands inconsistently Safety/Judgement: Decreased awareness of safety, Decreased awareness of deficits Awareness: Intellectual Problem Solving: Slow processing, Difficulty sequencing, Requires verbal cues, Requires tactile cues          General Comments General comments (skin integrity, edema,  etc.): tachycardic up to 145 in standing, HR recovers quickly to 90s when seated and resting    Exercises     Assessment/Plan    PT Assessment Patient needs continued PT services  PT Problem List Decreased strength;Decreased activity tolerance;Decreased balance;Decreased mobility;Decreased cognition;Decreased knowledge of use of DME;Decreased safety awareness;Decreased knowledge of precautions;Pain       PT Treatment Interventions DME instruction;Gait training;Functional mobility training;Therapeutic activities;Therapeutic exercise;Stair training;Balance training;Neuromuscular re-education;Cognitive remediation;Patient/family education;Wheelchair mobility training    PT Goals (Current goals can be found in the Care Plan section)  Acute Rehab PT Goals Patient Stated Goal: to reduce pain, restore independence PT Goal Formulation: With patient Time For Goal Achievement: 05/13/22 Potential to Achieve Goals: Fair    Frequency Min 3X/week     Co-evaluation PT/OT/SLP Co-Evaluation/Treatment: Yes Reason for Co-Treatment: Complexity of the patient's impairments (multi-system involvement);To address functional/ADL transfers;Necessary to address cognition/behavior during functional activity;For patient/therapist safety PT goals addressed during session: Mobility/safety with mobility;Balance;Proper use of DME;Strengthening/ROM         AM-PAC PT "6 Clicks" Mobility  Outcome Measure Help needed turning from your back to your side while in a flat bed without using bedrails?: A Lot Help needed moving from lying on your back to sitting on the side of a flat bed without using bedrails?: Total Help needed moving to and from a bed to a chair (including a wheelchair)?: A Lot Help needed standing up from a chair using your arms (e.g., wheelchair or bedside chair)?: A Lot Help needed to walk in hospital room?: Total Help needed climbing 3-5 steps with a railing? : Total 6 Click Score: 9  End  of Session Equipment Utilized During Treatment: Gait belt Activity Tolerance: Patient tolerated treatment well Patient left: in chair;with call bell/phone within reach;with chair alarm set Nurse Communication: Mobility status;Need for lift equipment (STEDY) PT Visit Diagnosis: Other abnormalities of gait and mobility (R26.89);Muscle weakness (generalized) (M62.81);Pain Pain - Right/Left: Right Pain - part of body: Hip    Time: 0930-1000 PT Time Calculation (min) (ACUTE ONLY): 30 min   Charges:   PT Evaluation $PT Eval Low Complexity: 1 Low          Arlyss Gandy, PT, DPT Acute Rehabilitation Office [phone]   Arlyss Gandy 04/29/2022, 10:20 AM

## 2022-04-30 ENCOUNTER — Encounter (HOSPITAL_COMMUNITY): Payer: Self-pay | Admitting: Orthopedic Surgery

## 2022-04-30 DIAGNOSIS — S72001A Fracture of unspecified part of neck of right femur, initial encounter for closed fracture: Secondary | ICD-10-CM | POA: Diagnosis not present

## 2022-04-30 DIAGNOSIS — J449 Chronic obstructive pulmonary disease, unspecified: Secondary | ICD-10-CM | POA: Diagnosis not present

## 2022-04-30 DIAGNOSIS — I5042 Chronic combined systolic (congestive) and diastolic (congestive) heart failure: Secondary | ICD-10-CM | POA: Diagnosis not present

## 2022-04-30 DIAGNOSIS — N179 Acute kidney failure, unspecified: Secondary | ICD-10-CM | POA: Diagnosis not present

## 2022-04-30 LAB — BASIC METABOLIC PANEL
Anion gap: 3 — ABNORMAL LOW (ref 5–15)
BUN: 17 mg/dL (ref 8–23)
CO2: 25 mmol/L (ref 22–32)
Calcium: 8.4 mg/dL — ABNORMAL LOW (ref 8.9–10.3)
Chloride: 109 mmol/L (ref 98–111)
Creatinine, Ser: 0.99 mg/dL (ref 0.61–1.24)
GFR, Estimated: >60 mL/min (ref >60)
Glucose, Bld: 99 mg/dL (ref 70–99)
Potassium: 4.1 mmol/L (ref 3.5–5.1)
Sodium: 137 mmol/L (ref 135–145)

## 2022-04-30 LAB — CBC
HCT: 26 % — ABNORMAL LOW (ref 39.0–52.0)
Hemoglobin: 8.4 g/dL — ABNORMAL LOW (ref 13.0–17.0)
MCH: 30.7 pg (ref 26.0–34.0)
MCHC: 32.3 g/dL (ref 30.0–36.0)
MCV: 94.9 fL (ref 80.0–100.0)
Platelets: 104 10*3/uL — ABNORMAL LOW (ref 150–400)
RBC: 2.74 MIL/uL — ABNORMAL LOW (ref 4.22–5.81)
RDW: 14.2 % (ref 11.5–15.5)
WBC: 9 10*3/uL (ref 4.0–10.5)
nRBC: 0 % (ref 0.0–0.2)

## 2022-04-30 LAB — MAGNESIUM: Magnesium: 1.9 mg/dL (ref 1.7–2.4)

## 2022-04-30 LAB — HEMOGLOBIN AND HEMATOCRIT, BLOOD
HCT: 26 % — ABNORMAL LOW (ref 39.0–52.0)
HCT: 27.5 % — ABNORMAL LOW (ref 39.0–52.0)
Hemoglobin: 8.7 g/dL — ABNORMAL LOW (ref 13.0–17.0)
Hemoglobin: 9.2 g/dL — ABNORMAL LOW (ref 13.0–17.0)

## 2022-04-30 MED ORDER — HYDROCODONE-ACETAMINOPHEN 5-325 MG PO TABS: 1.0000 | ORAL_TABLET | Freq: Four times a day (QID) | ORAL | 0 refills | Status: DC | PRN

## 2022-04-30 NOTE — Progress Notes (Signed)
Subjective: Patient reports pain as mild to moderate.  Tolerating diet.  Urinating.   No CP, SOB.  Has mobilized OOB with PT.  Objective:   VITALS:   Vitals:   04/29/22 1346 04/29/22 1921 04/30/22 0452 04/30/22 0752  BP: (!) 99/51 (!) 110/90 (!) 101/53 (!) 98/53  Pulse: 63 66 (!) 55 (!) 53  Resp: 17 16 16 16   Temp: 98 F (36.7 C) 98.3 F (36.8 C) 97.9 F (36.6 C) 98.2 F (36.8 C)  TempSrc: Oral Oral Oral   SpO2: 91% 100% 99% 100%  Weight:      Height:          Latest Ref Rng & Units 04/30/2022    2:54 AM 04/27/2022    9:29 PM 04/22/2022    9:17 AM  CBC  WBC 4.0 - 10.5 K/uL 9.0  8.0  6.8   Hemoglobin 13.0 - 17.0 g/dL 8.4  69.4  50.3   Hematocrit 39.0 - 52.0 % 26.0  36.0  33.3   Platelets 150 - 400 K/uL 104  131  126       Latest Ref Rng & Units 04/30/2022    2:54 AM 04/27/2022    9:29 PM 04/22/2022    9:17 AM  BMP  Glucose 70 - 99 mg/dL 99  99  96   BUN 8 - 23 mg/dL 17  14  22    Creatinine 0.61 - 1.24 mg/dL 8.88  2.80  0.34   Sodium 135 - 145 mmol/L 137  141  139   Potassium 3.5 - 5.1 mmol/L 4.1  4.3  3.5   Chloride 98 - 111 mmol/L 109  104  107   CO2 22 - 32 mmol/L 25  24  22    Calcium 8.9 - 10.3 mg/dL 8.4  9.3  8.9    Intake/Output      04/08 0701 04/09 0700 04/09 0701 04/10 0700   P.O. 480    I.V. (mL/kg)     IV Piggyback     Total Intake(mL/kg) 480 (6)    Urine (mL/kg/hr) 1350 (0.7)    Blood     Total Output 1350    Net -870            Physical Exam: General: NAD.  Sitting up in bed, calm, comfortable Resp: No increased wob Cardio: regular rate and rhythm ABD soft Neurologically intact MSK Neurovascularly intact Sensation intact distally Intact pulses distally Dorsiflexion/Plantar flexion intact Incision: dressing C/D/I   Assessment: 2 Days Post-Op  S/P Procedure(s) (LRB): ARTHROPLASTY BIPOLAR HIP (HEMIARTHROPLASTY) (Right) by Dr. Jewel Baize. [name] on 04/28/22  Principal Problem:   Hip fracture Active Problems:   COPD (chronic  obstructive pulmonary disease)   Primary adenocarcinoma of upper lobe of right lung   AKI (acute kidney injury)   Essential hypertension   PAF (paroxysmal atrial fibrillation)   History of stroke   Chronic combined systolic and diastolic CHF (congestive heart failure)   Normocytic anemia   Plan:  Advance diet Up with therapy Incentive Spirometry Elevate and Apply ice  Weightbearing: WBAT RLE Insicional and dressing care: Dressings left intact until follow-up and Reinforce dressings as needed Orthopedic device(s): None Showering: Keep dressing dry VTE prophylaxis:  home dose Eliquis  , SCDs, ambulation Pain control: PRN Follow - up plan: 2 weeks Contact information:  Margarita Rana MD, Levester Fresh PA-C  Dispo:  SNF pending bed placement. Ok for d/c from ortho standpoint when medically ready. Will print and sign pain  script.     Jenne Pane, PA-C Office [phone] 04/30/2022, 8:08 AM

## 2022-04-30 NOTE — TOC Progression Note (Addendum)
Transition of Care [facility]) - Progression Note    Patient Details  Name: Darryl Ward MRN: [id number] Date of Birth: 1945-06-19  [facility] CM/SW Contact  Lorri Frederick, LCSW Phone Number: 04/30/2022, 9:54 AM  Clinical Narrative:   CSW spoke with [name], provided bed offers.  [facility] is full.  She is asking for response from [facility]  CSW reached out to that facility.  Auth request made with [facility].  1130: [facility] cannot accept [facility].  1140: Auth approved by [facility]: 7 days: [id number].   1145: CSW spoke with [name], update provided.  She would like to accept offer at [facility].  Waiting on confirmation from [facility].   LM with Tam[facility] with facility choice.  [facility] confirmed.  Per MD not stable to DC today.  PASSR received: [id number]    Expected Discharge Plan: Skilled Nursing Facility Barriers to Discharge: Continued Medical Work up, SNF Pending bed offer  Expected Discharge Plan and Services In-house Referral: Clinical Social Work   Post Acute Care Choice: Skilled Nursing Facility Living arrangements for the past 2 months: Single Family Home                                       Social Determinants of Health (SDOH) Interventions SDOH Screenings   Food Insecurity: No Food Insecurity (11/29/2021)  Housing: Low Risk  (11/29/2021)  Transportation Needs: No Transportation Needs (11/29/2021)  Utilities: Not At Risk (11/29/2021)  Depression (PHQ2-9): Medium Risk (03/27/2022)  Financial Resource Strain: Low Risk  (11/29/2019)  Physical Activity: Inactive (11/29/2019)  Social Connections: Moderately Isolated (11/29/2019)  Stress: No Stress Concern Present (11/29/2019)  Tobacco Use: Medium Risk (04/28/2022)    Readmission Risk Interventions     No data to display

## 2022-04-30 NOTE — Progress Notes (Signed)
Mobility Specialist Progress Note   04/30/22 1130  Pain Assessment  Pain Assessment 0-10  Pain Score 5  Pain Location RLE hip  Pain Descriptors / Indicators Discomfort  Pain Intervention(s) Monitored during session  Mobility  Activity Transferred from bed to chair  Level of Assistance Maximum assist, patient does 25-49%  Assistive Device Front wheel walker  Range of Motion/Exercises Active;All extremities  RLE Weight Bearing WBAT  Activity Response Tolerated well   Patient received in supine and agreeable to participate. Required mod A for bed mobility more so to assist RLE. Needed constant cueing to maintain posterior hip precautions and cues for hand/foot placement and sequencing. Stood with mod A and required max A to step/pivot to recliner to prevent LOB secondary to posterior trunk lean.  Tolerated without complaint or incident. Was left in recliner with all needs met, call bell in reach and wife present.  Darryl Ward, BS EXP Mobility Specialist Please contact via SecureChat or Rehab office at [phone]

## 2022-04-30 NOTE — Progress Notes (Signed)
PROGRESS NOTE    Darryl Ward  OEU:[id number] DOB: [birth year] DOA: 04/27/2022 PCP: [name], DO    Brief Narrative:   Darryl Ward is a 77 y.o. male with past medical history significant of paroxysmal atrial fibrillation on Apixaban, hypertension, HTN, combined systolic and diastolic heart failure, s/p AAA repair, CVA, CAD, COPD, adenocarcinoma of the right lung on immunotherapy presented to the [place] hospital with fall after he tripped.  On Eliquis.  In the ED, hemoglobin was 11.4 with baseline around 10.  Has chronic thrombocytopenia at 131.  Creatinine was 1.3 from previous 0.9.  X-ray of the hip showed a right femoral fracture with impaction and angulation.  Orthopedics was notified from the ED and patient was admitted to the hospital for further evaluation and treatment.  He underwent right hip hemiarthroplasty on 04/28/2022 and and had a significant postoperative drop in hemoglobin.  Currently  waiting for skilled nursing facility placement at this time with close hemoglobin monitoring today.  Assessment and Plan:  Right hip fracture Orthopedics was consulted and patient underwent  right hip hemiarthroplasty on 04/28/2022.   Orthopedic recommends weightbearing as tolerated on the right lower extremity and follow-up with Dr. [name] in 2 weeks.  Currently awaiting skilled nursing facility placement.   Acute blood loss anemia.   Likely postoperative anemia.    No obvious hematoma reported. Hemoglobin of 8.4 today dropped from 11.4 yesterday.  No external bleeding reported by the nursing staff.  Has been started on Eliquis since 04/29/2022.  Recently received Feraheme with oncology.  Will continue to monitor closely.  Might need transfusion and holding Eliquis if continues to drop hemoglobin.  I have spoken with orthopedic team about it.  Patient did have difficulty with hemostasis and might have lost blood during surgery as per orthopedics.Will check  H&H every 12 hourly.   Chronic  combined systolic and diastolic CHF (congestive heart failure) On Lasix at 20 mg daily as needed at home.  Will resume Lasix tomorrow if blood pressure improved, creatinine at 0.9.  Check BMP in AM.  History of stroke Has been started on Eliquis since 04/29/2022 with drop in hemoglobin today.  Continue to monitor.  PAF (paroxysmal atrial fibrillation) Rate controlled.  On metoprolol, Eliquis.   Essential hypertension Controlled.  Patient is on metoprolol from home.  Blood pressure is stable.  AKI (acute kidney injury)   Received IV fluids.  Creatinine has improved today.  Still holding Lasix due to borderline low blood pressure.  Could restart from tomorrow if improved   primary adenocarcinoma of upper lobe of right lung -has underwent 3 cycles of pembrolizumab. Last cycle on 04/22/22. Follows up with Dr. Ellin Saba with [facility] currently appears to be stable.   COPD (chronic obstructive pulmonary disease) - Stable, not in exacerbation, continue bronchodilators.     DVT prophylaxis: SCDs Start: 04/28/22 1521 Place TED hose Start: 04/28/22 1521 apixaban (ELIQUIS) tablet 5 mg   Code Status:     Code Status: Full Code  Disposition:  Skilled nursing facility placement likely on 05/01/2022 if hemoglobin remains stable  Status is: Inpatient  Remains inpatient appropriate because: Status post hip surgery, need for skilled nursing facility placement, acute drop in hemoglobin, closer observation.   Family Communication:  I spoke with the patient's significant other on the phone on 04/29/2022.  Consultants:  Orthopedics.  Procedures:  Right hip hemiarthroplasty on 04/28/2022.    Antimicrobials:  None  Anti-infectives (From admission, onward)    Start  Dose/Rate Route Frequency Ordered Stop   04/28/22 1615  ceFAZolin (ANCEF) IVPB 2g/100 mL premix        2 g 200 mL/hr over 30 Minutes Intravenous Every 6 hours 04/28/22 1520 04/28/22 2255   04/28/22 0830  ceFAZolin  (ANCEF) IVPB 2g/100 mL premix        2 g 200 mL/hr over 30 Minutes Intravenous On call to O.R. 04/28/22 0815 04/28/22 1104   04/28/22 0823  ceFAZolin (ANCEF) 2-4 GM/100ML-% IVPB       Note to Pharmacy: Crissie Sickles: cabinet override      04/28/22 0823 04/28/22 1108      Subjective: Today, patient was seen and examined at bedside.  Complains of mild discomfort but no other issues.  Hemoglobin has dropped significantly but without any significant external bleeding.  Objective: Vitals:   04/29/22 1921 04/30/22 0452 04/30/22 0752 04/30/22 0900  BP: (!) 110/90 (!) 101/53 (!) 98/53 (!) 98/59  Pulse: 66 (!) 55 (!) 53 68  Resp: 16 16 16    Temp: 98.3 F (36.8 C) 97.9 F (36.6 C) 98.2 F (36.8 C)   TempSrc: Oral Oral    SpO2: 100% 99% 100%   Weight:      Height:        Intake/Output Summary (Last 24 hours) at 04/30/2022 1137 Last data filed at 04/30/2022 0900 Gross per 24 hour  Intake 680 ml  Output 1350 ml  Net -670 ml    Filed Weights   04/27/22 2126 04/28/22 0840  Weight: 79.4 kg 79.4 kg    Physical Examination: Body mass index is 25.11 kg/m.   General:  Average built, not in obvious distress HENT:   Mild pallor noted, oral mucosa is moist.  Chest:   Diminished breath sounds bilaterally. No crackles or wheezes.  CVS: S1 &S2 heard. No murmur.  Regular rate and rhythm. Abdomen: Soft, nontender, nondistended.  Bowel sounds are heard.   Extremities: No cyanosis, clubbing or edema.  Peripheral pulses are palpable.  Right hip surgical site with dressing. Psych: Alert, awake and oriented, normal mood CNS:  No cranial nerve deficits.  Moves all extremities Skin: Warm and dry,  Data Reviewed:   CBC: Recent Labs  Lab 04/27/22 2129 04/30/22 0254  WBC 8.0 9.0  NEUTROABS 5.4  --   HGB 11.4* 8.4*  HCT 36.0* 26.0*  MCV 96.0 94.9  PLT 131* 104*     Basic Metabolic Panel: Recent Labs  Lab 04/27/22 2129 04/30/22 0254  NA 141 137  K 4.3 4.1  CL 104 109  CO2 24 25   GLUCOSE 99 99  BUN 14 17  CREATININE 1.31* 0.99  CALCIUM 9.3 8.4*  MG  --  1.9     Liver Function Tests: No results for input(s): "AST", "ALT", "ALKPHOS", "BILITOT", "PROT", "ALBUMIN" in the last 168 hours.   Radiology Studies: Pelvis Portable  Result Date: 04/28/2022 CLINICAL DATA:  Right hip hemiarthroplasty. EXAM: PORTABLE PELVIS 1-2 VIEWS COMPARISON:  Preoperative radiograph FINDINGS: Right hip hemi arthroplasty in expected alignment. No periprosthetic lucency or fracture. Recent postsurgical change includes air and edema in the soft tissues. Lower lumbar surgical hardware is partially included. IMPRESSION: Right hip hemi arthroplasty without immediate postoperative complication. Electronically Signed   By: Narda Rutherford M.D.   On: 04/28/2022 13:14      LOS: 2 days    Joycelyn Das, MD [facility] Available via Epic secure chat 7am-7pm After these hours, please refer to coverage provider listed on [email or web address] 04/30/2022,  11:37 AM

## 2022-05-01 ENCOUNTER — Observation Stay (HOSPITAL_COMMUNITY)
Admission: EM | Admit: 2022-05-01 | Discharge: 2022-05-04 | Disposition: A | Discharge status: Skilled Nursing Facility | Payer: HMO | Attending: Internal Medicine | Admitting: Internal Medicine

## 2022-05-01 ENCOUNTER — Other Ambulatory Visit: Payer: Self-pay

## 2022-05-01 ENCOUNTER — Encounter (HOSPITAL_COMMUNITY): Payer: Self-pay | Admitting: *Deleted

## 2022-05-01 ENCOUNTER — Emergency Department (HOSPITAL_COMMUNITY): Payer: HMO

## 2022-05-01 DIAGNOSIS — Z87891 Personal history of nicotine dependence: Secondary | ICD-10-CM | POA: Insufficient documentation

## 2022-05-01 DIAGNOSIS — I10 Essential (primary) hypertension: Secondary | ICD-10-CM | POA: Diagnosis present

## 2022-05-01 DIAGNOSIS — J449 Chronic obstructive pulmonary disease, unspecified: Secondary | ICD-10-CM | POA: Diagnosis not present

## 2022-05-01 DIAGNOSIS — N179 Acute kidney failure, unspecified: Secondary | ICD-10-CM | POA: Diagnosis not present

## 2022-05-01 DIAGNOSIS — I5042 Chronic combined systolic (congestive) and diastolic (congestive) heart failure: Secondary | ICD-10-CM | POA: Diagnosis present

## 2022-05-01 DIAGNOSIS — I110 Hypertensive heart disease with heart failure: Secondary | ICD-10-CM | POA: Insufficient documentation

## 2022-05-01 DIAGNOSIS — I2510 Atherosclerotic heart disease of native coronary artery without angina pectoris: Secondary | ICD-10-CM | POA: Diagnosis present

## 2022-05-01 DIAGNOSIS — R4182 Altered mental status, unspecified: Principal | ICD-10-CM | POA: Diagnosis present

## 2022-05-01 DIAGNOSIS — M51369 Other intervertebral disc degeneration, lumbar region without mention of lumbar back pain or lower extremity pain: Secondary | ICD-10-CM | POA: Insufficient documentation

## 2022-05-01 DIAGNOSIS — G9340 Encephalopathy, unspecified: Secondary | ICD-10-CM | POA: Insufficient documentation

## 2022-05-01 DIAGNOSIS — Z7901 Long term (current) use of anticoagulants: Secondary | ICD-10-CM | POA: Insufficient documentation

## 2022-05-01 DIAGNOSIS — E782 Mixed hyperlipidemia: Secondary | ICD-10-CM | POA: Diagnosis present

## 2022-05-01 DIAGNOSIS — Z79899 Other long term (current) drug therapy: Secondary | ICD-10-CM | POA: Insufficient documentation

## 2022-05-01 DIAGNOSIS — Z8673 Personal history of transient ischemic attack (TIA), and cerebral infarction without residual deficits: Secondary | ICD-10-CM

## 2022-05-01 DIAGNOSIS — D62 Acute posthemorrhagic anemia: Secondary | ICD-10-CM | POA: Insufficient documentation

## 2022-05-01 DIAGNOSIS — I480 Paroxysmal atrial fibrillation: Secondary | ICD-10-CM | POA: Diagnosis present

## 2022-05-01 DIAGNOSIS — R410 Disorientation, unspecified: Secondary | ICD-10-CM

## 2022-05-01 DIAGNOSIS — I219 Acute myocardial infarction, unspecified: Secondary | ICD-10-CM | POA: Insufficient documentation

## 2022-05-01 DIAGNOSIS — S72001A Fracture of unspecified part of neck of right femur, initial encounter for closed fracture: Secondary | ICD-10-CM | POA: Diagnosis not present

## 2022-05-01 DIAGNOSIS — Z1152 Encounter for screening for COVID-19: Secondary | ICD-10-CM | POA: Insufficient documentation

## 2022-05-01 DIAGNOSIS — M5136 Other intervertebral disc degeneration, lumbar region: Secondary | ICD-10-CM | POA: Insufficient documentation

## 2022-05-01 LAB — HEMOGLOBIN AND HEMATOCRIT, BLOOD
HCT: 27.1 % — ABNORMAL LOW (ref 39.0–52.0)
Hemoglobin: 8.8 g/dL — ABNORMAL LOW (ref 13.0–17.0)

## 2022-05-01 LAB — CBC
HCT: 27.6 % — ABNORMAL LOW (ref 39.0–52.0)
Hemoglobin: 8.9 g/dL — ABNORMAL LOW (ref 13.0–17.0)
MCH: 30.8 pg (ref 26.0–34.0)
MCHC: 32.2 g/dL (ref 30.0–36.0)
MCV: 95.5 fL (ref 80.0–100.0)
Platelets: 139 10*3/uL — ABNORMAL LOW (ref 150–400)
RBC: 2.89 MIL/uL — ABNORMAL LOW (ref 4.22–5.81)
RDW: 14.1 % (ref 11.5–15.5)
WBC: 9 10*3/uL (ref 4.0–10.5)
nRBC: 0 % (ref 0.0–0.2)

## 2022-05-01 LAB — URINALYSIS, ROUTINE W REFLEX MICROSCOPIC
Bilirubin Urine: NEGATIVE
Glucose, UA: NEGATIVE mg/dL
Ketones, ur: NEGATIVE mg/dL
Leukocytes,Ua: NEGATIVE
Nitrite: NEGATIVE
Protein, ur: NEGATIVE mg/dL
Specific Gravity, Urine: 1.015 (ref 1.005–1.030)
pH: 6 (ref 5.0–8.0)

## 2022-05-01 LAB — TROPONIN I (HIGH SENSITIVITY)
Troponin I (High Sensitivity): 10 ng/L (ref <18)
Troponin I (High Sensitivity): 9 ng/L (ref <18)

## 2022-05-01 LAB — BASIC METABOLIC PANEL
Anion gap: 6 (ref 5–15)
BUN: 15 mg/dL (ref 8–23)
CO2: 23 mmol/L (ref 22–32)
Calcium: 8.6 mg/dL — ABNORMAL LOW (ref 8.9–10.3)
Chloride: 106 mmol/L (ref 98–111)
Creatinine, Ser: 0.96 mg/dL (ref 0.61–1.24)
GFR, Estimated: >60 mL/min (ref >60)
Glucose, Bld: 98 mg/dL (ref 70–99)
Potassium: 3.7 mmol/L (ref 3.5–5.1)
Sodium: 135 mmol/L (ref 135–145)

## 2022-05-01 LAB — RESP PANEL BY RT-PCR (RSV, FLU A&B, COVID)  RVPGX2
Influenza A by PCR: NEGATIVE
Influenza B by PCR: NEGATIVE
Resp Syncytial Virus by PCR: NEGATIVE
SARS Coronavirus 2 by RT PCR: NEGATIVE

## 2022-05-01 LAB — COMPREHENSIVE METABOLIC PANEL
ALT: 11 U/L (ref 0–44)
AST: 29 U/L (ref 15–41)
Albumin: 3 g/dL — ABNORMAL LOW (ref 3.5–5.0)
Alkaline Phosphatase: 54 U/L (ref 38–126)
Anion gap: 6 (ref 5–15)
BUN: 19 mg/dL (ref 8–23)
CO2: 26 mmol/L (ref 22–32)
Calcium: 8.8 mg/dL — ABNORMAL LOW (ref 8.9–10.3)
Chloride: 104 mmol/L (ref 98–111)
Creatinine, Ser: 0.97 mg/dL (ref 0.61–1.24)
GFR, Estimated: >60 mL/min (ref >60)
Glucose, Bld: 110 mg/dL — ABNORMAL HIGH (ref 70–99)
Potassium: 4 mmol/L (ref 3.5–5.1)
Sodium: 136 mmol/L (ref 135–145)
Total Bilirubin: 0.6 mg/dL (ref 0.3–1.2)
Total Protein: 6.1 g/dL — ABNORMAL LOW (ref 6.5–8.1)

## 2022-05-01 LAB — RAPID URINE DRUG SCREEN, HOSP PERFORMED
Amphetamines: NOT DETECTED
Barbiturates: NOT DETECTED
Benzodiazepines: NOT DETECTED
Cocaine: NOT DETECTED
Opiates: POSITIVE — AB
Tetrahydrocannabinol: NOT DETECTED

## 2022-05-01 LAB — CBG MONITORING, ED: Glucose-Capillary: 101 mg/dL — ABNORMAL HIGH (ref 70–99)

## 2022-05-01 LAB — MAGNESIUM: Magnesium: 1.8 mg/dL (ref 1.7–2.4)

## 2022-05-01 LAB — TSH: TSH: 1.077 u[IU]/mL (ref 0.350–4.500)

## 2022-05-01 MED ORDER — HEPARIN SOD (PORK) LOCK FLUSH 100 UNIT/ML IV SOLN
500.0000 [IU] | INTRAVENOUS | Status: AC | PRN
  Administered 2022-05-01: 500 [IU]

## 2022-05-01 MED ORDER — PROCHLORPERAZINE EDISYLATE 10 MG/2ML IJ SOLN: 5.0000 mg | Freq: Four times a day (QID) | INTRAMUSCULAR | Status: DC | PRN

## 2022-05-01 MED ORDER — POLYETHYLENE GLYCOL 3350 17 G PO PACK: 17.0000 g | PACK | Freq: Every day | ORAL | Status: DC | PRN

## 2022-05-01 MED ORDER — POLYETHYLENE GLYCOL 3350 17 G PO PACK: 17.0000 g | PACK | Freq: Every day | ORAL | 0 refills | Status: DC | PRN

## 2022-05-01 MED ORDER — PANTOPRAZOLE SODIUM 40 MG PO TBEC: 40.0000 mg | DELAYED_RELEASE_TABLET | Freq: Every day | ORAL | 0 refills | Status: DC

## 2022-05-01 MED ORDER — ACETAMINOPHEN 325 MG PO TABS
650.0000 mg | ORAL_TABLET | Freq: Four times a day (QID) | ORAL | Status: DC | PRN
  Administered 2022-05-02 – 2022-05-04 (×4): 650 mg via ORAL
  Filled 2022-05-01 (×4): qty 2

## 2022-05-01 NOTE — Progress Notes (Deleted)
Physician Discharge Summary  Darryl Ward YHC:[id number] DOB: May 08, 1945 DOA: 04/27/2022  PCP: [name], DO  Admit date: 04/27/2022 Discharge date: 05/01/2022  Admitted From: Home  Discharge disposition: Skilled nursing facility  Recommendations for Outpatient Follow-Up:   Follow up with your primary care provider at the skilled nursing facility in 3 to 5 days. Check CBC, BMP, magnesium in the next visit Weightbearing as tolerated.  Follow-up with Dr. [name] orthopedics in 2 weeks. Follow-up with oncology as scheduled by the clinic.   Discharge Diagnosis:   Principal Problem:   Hip fracture Active Problems:   COPD (chronic obstructive pulmonary disease)   Primary adenocarcinoma of upper lobe of right lung   AKI (acute kidney injury)   Essential hypertension   PAF (paroxysmal atrial fibrillation)   History of stroke   Chronic combined systolic and diastolic CHF (congestive heart failure)   Normocytic anemia   Discharge Condition: Improved.  Diet recommendation: Low sodium, heart healthy.    Wound care: None.  Code status: Full.   History of Present Illness:   Darryl Ward is a 77 y.o. male with past medical history significant of paroxysmal atrial fibrillation on Apixaban, hypertension, HTN, combined systolic and diastolic heart failure, s/p AAA repair, CVA, CAD, COPD, adenocarcinoma of the right lung on immunotherapy presented to the [facility] with fall after he tripped.  On Eliquis.  In the ED, hemoglobin was 11.4 with baseline around 10.  Has chronic thrombocytopenia at 131.  Creatinine was 1.3 from previous 0.9.  X-ray of the hip showed a right femoral fracture with impaction and angulation.  Orthopedics was notified from the ED and patient was admitted to the hospital for further evaluation and treatment.  He underwent right hip hemiarthroplasty on 04/28/2022.  Hospital Course:   Following conditions were addressed during hospitalization as listed  below,  Right hip fracture Orthopedics was consulted and patient underwent  right hip hemiarthroplasty on 04/28/2022.   Orthopedic recommended weightbearing as tolerated on the right lower extremity and follow-up with Dr. [name] in 2 weeks.  Patient has been determined for skilled nursing facility placement.  Acute blood loss anemia.   Likely postoperative anemia.    No obvious hematoma noted.  Hemoglobin of  8.8 today from 8.4 yesterday, initial hemoglobin was 11.4. No external bleeding reported by the nursing staff.  Has been started on Eliquis since 04/29/2022.  Recently received Feraheme with oncology. Patient did have difficulty with hemostasis and might have lost blood during surgery as per orthopedics.  At this time hemoglobin seems to be stable.  Will need CBC monitoring in few days and possible IV iron with oncology as outpatient.   Chronic combined systolic and diastolic CHF (congestive heart failure) On Lasix at 20 mg daily as needed at home.  Will resume on discharge.  History of stroke Has been started on Eliquis since 04/29/2022.    PAF (paroxysmal atrial fibrillation) Rate controlled.  On metoprolol, Eliquis on discharge.   Essential hypertension Controlled.  Patient is on metoprolol from home.  Blood pressure is stable.   AKI (acute kidney injury) Resolved.  Will be restarted on Lasix on discharge.  primary adenocarcinoma of upper lobe of right lung -has underwent 3 cycles of pembrolizumab. Last cycle on 04/22/22. Follows up with Dr. Ellin Saba with [facility] oncology, currently appears to be stable.   COPD (chronic obstructive pulmonary disease) - Stable, not in exacerbation, continue bronchodilators on discharge..  Disposition.  At this time, patient is stable  for disposition to skilled nursing facility with outpatient orthopedics and oncology follow-up.  Medical Consultants:   Orthopedics  Procedures:    Right hip hemiarthroplasty on 04/28/2022.  Subjective:    Today, patient was seen and examined at bedside.  Mild discomfort but no overt pain shortness of breath chest pain or other symptoms.  Discharge Exam:   Vitals:   04/30/22 2041 05/01/22 0837  BP: (!) 117/41 114/63  Pulse: (!) 54 84  Resp: 18 18  Temp: 97.7 F (36.5 C) 98.8 F (37.1 C)  SpO2: 100% 95%   Vitals:   04/30/22 1417 04/30/22 2006 04/30/22 2041 05/01/22 0837  BP: (!) 105/56  (!) 117/41 114/63  Pulse: 64  (!) 54 84  Resp: 17  18 18   Temp: 97.6 F (36.4 C) 98.2 F (36.8 C) 97.7 F (36.5 C) 98.8 F (37.1 C)  TempSrc: Oral Oral  Oral  SpO2:  99% 100% 95%  Weight:      Height:        General: Alert awake, not in obvious distress HENT: pupils equally reacting to light, mild pallor noted.  Oral mucosa is moist.  Chest:  Clear breath sounds.  Diminished breath sounds bilaterally. No crackles or wheezes.  CVS: S1 &S2 heard. No murmur.  Regular rate and rhythm. Abdomen: Soft, nontender, nondistended.  Bowel sounds are heard.   Extremities: No cyanosis, clubbing or edema.  Peripheral pulses are palpable.  Right hip surgical site with dressing without obvious hematoma. Psych: Alert, awake and oriented, normal mood CNS:  No cranial nerve deficits.  Power equal in all extremities.   Skin: Warm and dry.  Right hip surgery with dressing.  The results of significant diagnostics from this hospitalization (including imaging, microbiology, ancillary and laboratory) are listed below for reference.     Diagnostic Studies:   Pelvis Portable  Result Date: 04/28/2022 CLINICAL DATA:  Right hip hemiarthroplasty. EXAM: PORTABLE PELVIS 1-2 VIEWS COMPARISON:  Preoperative radiograph FINDINGS: Right hip hemi arthroplasty in expected alignment. No periprosthetic lucency or fracture. Recent postsurgical change includes air and edema in the soft tissues. Lower lumbar surgical hardware is partially included. IMPRESSION: Right hip hemi arthroplasty without immediate postoperative complication.  Electronically Signed   By: Narda Rutherford M.D.   On: 04/28/2022 13:14   DG Hip Unilat With Pelvis 2-3 Views Right  Result Date: 04/27/2022 CLINICAL DATA:  Recent fall with right leg pain, initial encounter EXAM: DG HIP (WITH OR WITHOUT PELVIS) 2-3V RIGHT COMPARISON:  None Available. FINDINGS: Pelvic ring is intact. There is a mid right femoral neck fracture with impaction and angulation at the fracture site. Femoral head is well seated. Postsurgical changes are noted in the lower lumbar spine. IMPRESSION: Right femoral neck fracture with impaction and angulation at the fracture site. Electronically Signed   By: Alcide Clever M.D.   On: 04/27/2022 23:06     Labs:   Basic Metabolic Panel: Recent Labs  Lab 04/27/22 2129 04/30/22 0254 05/01/22 0658  NA 141 137 135  K 4.3 4.1 3.7  CL 104 109 106  CO2 24 25 23   GLUCOSE 99 99 98  BUN 14 17 15   CREATININE 1.31* 0.99 0.96  CALCIUM 9.3 8.4* 8.6*  MG  --  1.9  --    GFR Estimated Creatinine Clearance: 68.6 mL/min (by C-G formula based on SCr of 0.96 mg/dL). Liver Function Tests: No results for input(s): "AST", "ALT", "ALKPHOS", "BILITOT", "PROT", "ALBUMIN" in the last 168 hours. No results for input(s): "LIPASE", "AMYLASE"  in the last 168 hours. No results for input(s): "AMMONIA" in the last 168 hours. Coagulation profile Recent Labs  Lab 04/27/22 2129  INR 1.3*    CBC: Recent Labs  Lab 04/27/22 2129 04/30/22 0254 04/30/22 1405 04/30/22 1728 05/01/22 0658  WBC 8.0 9.0  --   --   --   NEUTROABS 5.4  --   --   --   --   HGB 11.4* 8.4* 8.7* 9.2* 8.8*  HCT 36.0* 26.0* 26.0* 27.5* 27.1*  MCV 96.0 94.9  --   --   --   PLT 131* 104*  --   --   --    Cardiac Enzymes: No results for input(s): "CKTOTAL", "CKMB", "CKMBINDEX", "TROPONINI" in the last 168 hours. BNP: Invalid input(s): "POCBNP" CBG: Recent Labs  Lab 04/27/22 2136  GLUCAP 94   D-Dimer No results for input(s): "DDIMER" in the last 72 hours. Hgb A1c No results  for input(s): "HGBA1C" in the last 72 hours. Lipid Profile No results for input(s): "CHOL", "HDL", "LDLCALC", "TRIG", "CHOLHDL", "LDLDIRECT" in the last 72 hours. Thyroid function studies No results for input(s): "TSH", "T4TOTAL", "T3FREE", "THYROIDAB" in the last 72 hours.  Invalid input(s): "FREET3" Anemia work up No results for input(s): "VITAMINB12", "FOLATE", "FERRITIN", "TIBC", "IRON", "RETICCTPCT" in the last 72 hours. Microbiology Recent Results (from the past 240 hour(s))  MRSA Next Gen by PCR, Nasal     Status: None   Collection Time: 04/28/22  5:42 AM   Specimen: Nasal Mucosa; Nasal Swab  Result Value Ref Range Status   MRSA by PCR Next Gen NOT DETECTED NOT DETECTED Final    Comment: (NOTE) The GeneXpert MRSA Assay (FDA approved for NASAL specimens only), is one component of a comprehensive MRSA colonization surveillance program. It is not intended to diagnose MRSA infection nor to guide or monitor treatment for MRSA infections. Test performance is not FDA approved in patients less than 75 years old. Performed at [facility], [address] [place], [place] [address]      Discharge Instructions:   Discharge Instructions     Call MD for:  temperature >100.4   Complete by: As directed    Diet - low sodium heart healthy   Complete by: As directed    Discharge instructions   Complete by: As directed    Follow-up with your primary care provider at the skilled nursing facility in 3 to 5 days.  Check blood work at that time.  Follow-up with orthopedics in 2 weeks.  Seek medical attention for worsening symptoms.   Increase activity slowly   Complete by: As directed       Allergies as of 05/01/2022   No Known Allergies      Medication List     STOP taking these medications    traMADol 50 MG tablet Commonly known as: ULTRAM       TAKE these medications    acetaminophen 500 MG tablet Commonly known as: TYLENOL Take 2 tablets (1,000 mg total) by  mouth every 6 (six) hours as needed.   albuterol 108 (90 Base) MCG/ACT inhaler Commonly known as: VENTOLIN HFA Inhale 2 puffs into the lungs every 6 (six) hours as needed for wheezing or shortness of breath.   atorvastatin 80 MG tablet Commonly known as: LIPITOR Take 80 mg by mouth daily.   cyclobenzaprine 10 MG tablet Commonly known as: FLEXERIL Take 1 tablet (10 mg total) by mouth 2 (two) times daily as needed.   Eliquis  5 MG Tabs tablet Generic drug: apixaban Take 1 tablet (5 mg total) by mouth 2 (two) times daily.   escitalopram 10 MG tablet Commonly known as: LEXAPRO Take 1 tablet (10 mg total) by mouth daily.   folic acid 1 MG tablet Commonly known as: FOLVITE Take 1 tablet (1 mg total) by mouth daily.   furosemide 20 MG tablet Commonly known as: LASIX Take 1 tablet (20 mg total) by mouth daily as needed. What changed: when to take this   HYDROcodone-acetaminophen 5-325 MG tablet Commonly known as: NORCO/VICODIN Take 1-2 tablets by mouth every 6 (six) hours as needed for severe pain.   lidocaine-prilocaine cream Commonly known as: EMLA Apply a small amount to port a cath site and cover with plastic wrap 1 hour prior to infusion appointments   Magnesium Oxide 400 MG Caps Take 1 capsule (400 mg total) by mouth daily.   metoprolol succinate 50 MG 24 hr tablet Commonly known as: TOPROL-XL Take 1.5 tablets (75 mg total) by mouth daily. Take with or immediately following a meal.   pantoprazole 40 MG tablet Commonly known as: PROTONIX Take 1 tablet (40 mg total) by mouth daily for 14 days.   polyethylene glycol 17 g packet Commonly known as: MIRALAX / GLYCOLAX Take 17 g by mouth daily as needed for mild constipation or moderate constipation.   prochlorperazine 10 MG tablet Commonly known as: COMPAZINE Take 1 tablet (10 mg total) by mouth every 6 (six) hours as needed for nausea/vomiting   Spiriva Respimat 2.5 MCG/ACT Aers Generic drug: Tiotropium Bromide  Monohydrate Inhale 2 puffs into the lungs daily.        Follow-up Information     Sheral Apley, MD. Schedule an appointment as soon as possible for a visit in 2 week(s).   Specialty: Orthopedic Surgery Contact information: [address] [place] [address] [phone]         [name], DO Follow up in 1 week(s).   Specialty: Family Medicine Contact information: [address] [place] [address] [phone]         [name], MD Follow up.   Specialty: Hematology Why: When scheduled by the clinic. Contact information: [address] [place] [address] [phone]                  Time coordinating discharge: 39 minutes  Signed:  Audreanna Torrisi  [facility] 05/01/2022, 10:59 AM

## 2022-05-01 NOTE — ED Triage Notes (Signed)
Pt arrived with EMS from [facility] for AMS. Pt fell on Saturday, fractured his R hip, had surgery on Sunday,and discharged from [facility] today to facility. Significant other noted pt to be confused prior to discharge from hospital. Pt currently getting treated for cancer and has low blood counts. On arrival, pt alert to self and year, reports being in [place]. Incision to R hip noted to be healing appropriately

## 2022-05-01 NOTE — Discharge Summary (Signed)
Physician Discharge Summary  ANOTHONY Ward XJO:[id number] DOB: 02/19/1945 DOA: 04/27/2022  PCP: [name], DO  Admit date: 04/27/2022 Discharge date: 05/01/2022  Admitted From: Home  Discharge disposition: Skilled nursing facility  Recommendations for Outpatient Follow-Up:   Follow up with your primary care provider at the skilled nursing facility in 3 to 5 days. Check CBC, BMP, magnesium in the next visit Weightbearing as tolerated.  Follow-up with Dr. [name] orthopedics in 2 weeks. Follow-up with oncology as scheduled by the clinic.   Discharge Diagnosis:   Principal Problem:   Hip fracture Active Problems:   COPD (chronic obstructive pulmonary disease)   Primary adenocarcinoma of upper lobe of right lung   AKI (acute kidney injury)   Essential hypertension   PAF (paroxysmal atrial fibrillation)   History of stroke   Chronic combined systolic and diastolic CHF (congestive heart failure)   Normocytic anemia   Discharge Condition: Improved.  Diet recommendation: Low sodium, heart healthy.    Wound care: None.  Code status: Full.   History of Present Illness:   Darryl Ward is a 77 y.o. male with past medical history significant of paroxysmal atrial fibrillation on Apixaban, hypertension, HTN, combined systolic and diastolic heart failure, s/p AAA repair, CVA, CAD, COPD, adenocarcinoma of the right lung on immunotherapy presented to the [facility] with fall after he tripped.  On Eliquis.  In the ED, hemoglobin was 11.4 with baseline around 10.  Has chronic thrombocytopenia at 131.  Creatinine was 1.3 from previous 0.9.  X-ray of the hip showed a right femoral fracture with impaction and angulation.  Orthopedics was notified from the ED and patient was admitted to the hospital for further evaluation and treatment.  He underwent right hip hemiarthroplasty on 04/28/2022.  Hospital Course:   Following conditions were addressed during hospitalization as listed  below,  Right hip fracture Orthopedics was consulted and patient underwent  right hip hemiarthroplasty on 04/28/2022.   Orthopedic recommended weightbearing as tolerated on the right lower extremity and follow-up with Dr. [name] in 2 weeks.  Patient has been determined for skilled nursing facility placement.  Acute blood loss anemia.   Likely postoperative anemia.    No obvious hematoma noted.  Hemoglobin of  8.8 today from 8.4 yesterday, initial hemoglobin was 11.4. No external bleeding reported by the nursing staff.  Has been started on Eliquis since 04/29/2022.  Recently received Feraheme with oncology. Patient did have difficulty with hemostasis and might have lost blood during surgery as per orthopedics.  At this time hemoglobin seems to be stable.  Will need CBC monitoring in few days and possible IV iron with oncology as outpatient.   Chronic combined systolic and diastolic CHF (congestive heart failure) On Lasix at 20 mg daily as needed at home.  Will resume on discharge.  History of stroke Has been started on Eliquis since 04/29/2022.    PAF (paroxysmal atrial fibrillation) Rate controlled.  On metoprolol, Eliquis on discharge.   Essential hypertension Controlled.  Patient is on metoprolol from home.  Blood pressure is stable.   AKI (acute kidney injury) Resolved.  Will be restarted on Lasix on discharge.  primary adenocarcinoma of upper lobe of right lung -has underwent 3 cycles of pembrolizumab. Last cycle on 04/22/22. Follows up with Dr. Ellin Saba with [facility] oncology, currently appears to be stable.   COPD (chronic obstructive pulmonary disease) - Stable, not in exacerbation, continue bronchodilators on discharge..  Disposition.  At this time, patient is stable  for disposition to skilled nursing facility with outpatient orthopedics and oncology follow-up.  Medical Consultants:   Orthopedics  Procedures:    Right hip hemiarthroplasty on 04/28/2022.  Subjective:    Today, patient was seen and examined at bedside.  Mild discomfort but no overt pain shortness of breath chest pain or other symptoms.  Discharge Exam:   Vitals:   04/30/22 2041 05/01/22 0837  BP: (!) 117/41 114/63  Pulse: (!) 54 84  Resp: 18 18  Temp: 97.7 F (36.5 C) 98.8 F (37.1 C)  SpO2: 100% 95%   Vitals:   04/30/22 1417 04/30/22 2006 04/30/22 2041 05/01/22 0837  BP: (!) 105/56  (!) 117/41 114/63  Pulse: 64  (!) 54 84  Resp: 17  18 18   Temp: 97.6 F (36.4 C) 98.2 F (36.8 C) 97.7 F (36.5 C) 98.8 F (37.1 C)  TempSrc: Oral Oral  Oral  SpO2:  99% 100% 95%  Weight:      Height:        General: Alert awake, not in obvious distress HENT: pupils equally reacting to light, mild pallor noted.  Oral mucosa is moist.  Chest:  Clear breath sounds.  Diminished breath sounds bilaterally. No crackles or wheezes.  CVS: S1 &S2 heard. No murmur.  Regular rate and rhythm. Abdomen: Soft, nontender, nondistended.  Bowel sounds are heard.   Extremities: No cyanosis, clubbing or edema.  Peripheral pulses are palpable.  Right hip surgical site with dressing without obvious hematoma. Psych: Alert, awake and oriented, normal mood CNS:  No cranial nerve deficits.  Power equal in all extremities.   Skin: Warm and dry.  Right hip surgery with dressing.  The results of significant diagnostics from this hospitalization (including imaging, microbiology, ancillary and laboratory) are listed below for reference.     Diagnostic Studies:   Pelvis Portable  Result Date: 04/28/2022 CLINICAL DATA:  Right hip hemiarthroplasty. EXAM: PORTABLE PELVIS 1-2 VIEWS COMPARISON:  Preoperative radiograph FINDINGS: Right hip hemi arthroplasty in expected alignment. No periprosthetic lucency or fracture. Recent postsurgical change includes air and edema in the soft tissues. Lower lumbar surgical hardware is partially included. IMPRESSION: Right hip hemi arthroplasty without immediate postoperative complication.  Electronically Signed   By: Narda RutherfordMelanie  Sanford M.D.   On: 04/28/2022 13:14   DG Hip Unilat With Pelvis 2-3 Views Right  Result Date: 04/27/2022 CLINICAL DATA:  Recent fall with right leg pain, initial encounter EXAM: DG HIP (WITH OR WITHOUT PELVIS) 2-3V RIGHT COMPARISON:  None Available. FINDINGS: Pelvic ring is intact. There is a mid right femoral neck fracture with impaction and angulation at the fracture site. Femoral head is well seated. Postsurgical changes are noted in the lower lumbar spine. IMPRESSION: Right femoral neck fracture with impaction and angulation at the fracture site. Electronically Signed   By: Alcide CleverMark  Lukens M.D.   On: 04/27/2022 23:06     Labs:   Basic Metabolic Panel: Recent Labs  Lab 04/27/22 2129 04/30/22 0254 05/01/22 0658  NA 141 137 135  K 4.3 4.1 3.7  CL 104 109 106  CO2 24 25 23   GLUCOSE 99 99 98  BUN 14 17 15   CREATININE 1.31* 0.99 0.96  CALCIUM 9.3 8.4* 8.6*  MG  --  1.9  --     GFR Estimated Creatinine Clearance: 68.6 mL/min (by C-G formula based on SCr of 0.96 mg/dL). Liver Function Tests: No results for input(s): "AST", "ALT", "ALKPHOS", "BILITOT", "PROT", "ALBUMIN" in the last 168 hours. No results for input(s): "LIPASE", "  AMYLASE" in the last 168 hours. No results for input(s): "AMMONIA" in the last 168 hours. Coagulation profile Recent Labs  Lab 04/27/22 2129  INR 1.3*     CBC: Recent Labs  Lab 04/27/22 2129 04/30/22 0254 04/30/22 1405 04/30/22 1728 05/01/22 0658  WBC 8.0 9.0  --   --   --   NEUTROABS 5.4  --   --   --   --   HGB 11.4* 8.4* 8.7* 9.2* 8.8*  HCT 36.0* 26.0* 26.0* 27.5* 27.1*  MCV 96.0 94.9  --   --   --   PLT 131* 104*  --   --   --     Cardiac Enzymes: No results for input(s): "CKTOTAL", "CKMB", "CKMBINDEX", "TROPONINI" in the last 168 hours. BNP: Invalid input(s): "POCBNP" CBG: Recent Labs  Lab 04/27/22 2136  GLUCAP 94    D-Dimer No results for input(s): "DDIMER" in the last 72 hours. Hgb A1c No  results for input(s): "HGBA1C" in the last 72 hours. Lipid Profile No results for input(s): "CHOL", "HDL", "LDLCALC", "TRIG", "CHOLHDL", "LDLDIRECT" in the last 72 hours. Thyroid function studies No results for input(s): "TSH", "T4TOTAL", "T3FREE", "THYROIDAB" in the last 72 hours.  Invalid input(s): "FREET3" Anemia work up No results for input(s): "VITAMINB12", "FOLATE", "FERRITIN", "TIBC", "IRON", "RETICCTPCT" in the last 72 hours. Microbiology Recent Results (from the past 240 hour(s))  MRSA Next Gen by PCR, Nasal     Status: None   Collection Time: 04/28/22  5:42 AM   Specimen: Nasal Mucosa; Nasal Swab  Result Value Ref Range Status   MRSA by PCR Next Gen NOT DETECTED NOT DETECTED Final    Comment: (NOTE) The GeneXpert MRSA Assay (FDA approved for NASAL specimens only), is one component of a comprehensive MRSA colonization surveillance program. It is not intended to diagnose MRSA infection nor to guide or monitor treatment for MRSA infections. Test performance is not FDA approved in patients less than 91 years old. Performed at [facility], [address] [place], [place] [address]      Discharge Instructions:   Discharge Instructions     Call MD for:  temperature >100.4   Complete by: As directed    Diet - low sodium heart healthy   Complete by: As directed    Discharge instructions   Complete by: As directed    Follow-up with your primary care provider at the skilled nursing facility in 3 to 5 days.  Check blood work at that time.  Follow-up with orthopedics in 2 weeks.  Seek medical attention for worsening symptoms.   Increase activity slowly   Complete by: As directed       Allergies as of 05/01/2022   No Known Allergies      Medication List     STOP taking these medications    traMADol 50 MG tablet Commonly known as: ULTRAM       TAKE these medications    acetaminophen 500 MG tablet Commonly known as: TYLENOL Take 2 tablets (1,000 mg  total) by mouth every 6 (six) hours as needed.   albuterol 108 (90 Base) MCG/ACT inhaler Commonly known as: VENTOLIN HFA Inhale 2 puffs into the lungs every 6 (six) hours as needed for wheezing or shortness of breath.   atorvastatin 80 MG tablet Commonly known as: LIPITOR Take 80 mg by mouth daily.   cyclobenzaprine 10 MG tablet Commonly known as: FLEXERIL Take 1 tablet (10 mg total) by mouth 2 (two) times daily as  needed.   Eliquis 5 MG Tabs tablet Generic drug: apixaban Take 1 tablet (5 mg total) by mouth 2 (two) times daily.   escitalopram 10 MG tablet Commonly known as: LEXAPRO Take 1 tablet (10 mg total) by mouth daily.   folic acid 1 MG tablet Commonly known as: FOLVITE Take 1 tablet (1 mg total) by mouth daily.   furosemide 20 MG tablet Commonly known as: LASIX Take 1 tablet (20 mg total) by mouth daily as needed. What changed: when to take this   HYDROcodone-acetaminophen 5-325 MG tablet Commonly known as: NORCO/VICODIN Take 1-2 tablets by mouth every 6 (six) hours as needed for severe pain.   lidocaine-prilocaine cream Commonly known as: EMLA Apply a small amount to port a cath site and cover with plastic wrap 1 hour prior to infusion appointments   Magnesium Oxide 400 MG Caps Take 1 capsule (400 mg total) by mouth daily.   metoprolol succinate 50 MG 24 hr tablet Commonly known as: TOPROL-XL Take 1.5 tablets (75 mg total) by mouth daily. Take with or immediately following a meal.   pantoprazole 40 MG tablet Commonly known as: PROTONIX Take 1 tablet (40 mg total) by mouth daily for 14 days.   polyethylene glycol 17 g packet Commonly known as: MIRALAX / GLYCOLAX Take 17 g by mouth daily as needed for mild constipation or moderate constipation.   prochlorperazine 10 MG tablet Commonly known as: COMPAZINE Take 1 tablet (10 mg total) by mouth every 6 (six) hours as needed for nausea/vomiting   Spiriva Respimat 2.5 MCG/ACT Aers Generic drug: Tiotropium  Bromide Monohydrate Inhale 2 puffs into the lungs daily.        Follow-up Information     Sheral Apley, MD. Schedule an appointment as soon as possible for a visit in 2 week(s).   Specialty: Orthopedic Surgery Contact information: [address] [place] [address] [phone]         [name], DO Follow up in 1 week(s).   Specialty: Family Medicine Contact information: [address] [place] [address] [phone]         [name], MD Follow up.   Specialty: Hematology Why: When scheduled by the clinic. Contact information: [address] [place] [address] [phone]                  Time coordinating discharge: 39 minutes  Signed:  Amarrah Meinhart  [facility] 05/01/2022, 11:00 AM

## 2022-05-01 NOTE — Progress Notes (Signed)
Occupational Therapy Treatment Patient Details Name: MACALLISTER ARIAN MRN: [id number] DOB: 1945/08/27 Today's Date: 05/01/2022   History of present illness 77 y.o. male presents to [facility] hospital on 04/27/2022 after a fall, with R hip pain. P found to have R femoral neck fx. Pt underwent R hip hemiarthroplasty on 4/7. PMH includes PAF, HTN, combined systolic and diastolic HF, AAA repair, CVA, COPD, adenocarcinoma of R lung.   OT comments  Patient was able to engage in scooting up head of bed with min guard with cues to maintain posterior hip precautions. Patient also able to engage in oral care with dentures sitting EOB with min guard with increased time. HR in 80s bpm during session sitting EOB. Patient's discharge plan remains appropriate at this time. OT will continue to follow acutely.     Recommendations for follow up therapy are one component of a multi-disciplinary discharge planning process, led by the attending physician.  Recommendations may be updated based on patient status, additional functional criteria and insurance authorization.    Assistance Recommended at Discharge Frequent or constant Supervision/Assistance  Patient can return home with the following  A lot of help with walking and/or transfers;A lot of help with bathing/dressing/bathroom;Assistance with cooking/housework;Direct supervision/assist for medications management;Direct supervision/assist for financial management;Assist for transportation;Help with stairs or ramp for entrance   Equipment Recommendations  BSC/3in1       Precautions / Restrictions Precautions Precautions: Fall;Posterior Hip Precaution Booklet Issued: Yes (comment) Precaution Comments: posted in room Restrictions Weight Bearing Restrictions: Yes RLE Weight Bearing: Weight bearing as tolerated       Mobility Bed Mobility Overal bed mobility: Needs Assistance Bed Mobility: Supine to Sit, Sit to Supine     Supine to sit: Mod assist Sit to  supine: Max assist   General bed mobility comments: patient needed increased A to maintian posterior hip precautions during session. patient continued to have no awareness of posterior hip precautions during session.                ADL either performed or assessed with clinical judgement   ADL Overall ADL's : Needs assistance/impaired     Grooming: Set up;Sitting Grooming Details (indicate cue type and reason): EOB with increased time.                   Toilet Transfer Details (indicate cue type and reason): patient was able to scoot himself down the bed to optomize positioning in bed.           General ADL Comments: limited by pain, precautions and cognition      Cognition Arousal/Alertness: Awake/alert Behavior During Therapy: WFL for tasks assessed/performed Overall Cognitive Status: Impaired/Different from baseline             Following Commands: Follows one step commands with increased time, Follows multi-step commands inconsistently, Follows one step commands inconsistently       General Comments: patient was plesant and coopertive, some delay in responses                   Pertinent Vitals/ Pain       Pain Assessment Pain Assessment: Faces Pain Score: 5  Pain Location: RLE hip Pain Descriptors / Indicators: Discomfort, Aching, Guarding Pain Intervention(s): Limited activity within patient's tolerance, Monitored during session, Repositioned, Patient requesting pain meds-RN notified         Frequency  Min 2X/week        Progress Toward Goals  OT Goals(current goals can now be  found in the care plan section)  Progress towards OT goals: Progressing toward goals     Plan Discharge plan remains appropriate       AM-PAC OT "6 Clicks" Daily Activity     Outcome Measure   Help from another person eating meals?: None Help from another person taking care of personal grooming?: A Little Help from another person toileting, which  includes using toliet, bedpan, or urinal?: A Lot Help from another person bathing (including washing, rinsing, drying)?: A Lot Help from another person to put on and taking off regular upper body clothing?: A Little Help from another person to put on and taking off regular lower body clothing?: A Lot 6 Click Score: 16    End of Session    OT Visit Diagnosis: Unsteadiness on feet (R26.81);Other abnormalities of gait and mobility (R26.89);Muscle weakness (generalized) (M62.81);History of falling (Z91.81);Pain   Activity Tolerance Patient limited by pain   Patient Left in bed;with call bell/phone within reach;with bed alarm set   Nurse Communication Patient requests pain meds        Time: 0315-9458 OT Time Calculation (min): 26 min  Charges: OT General Charges $OT Visit: 1 Visit OT Treatments $Self Care/Home Management : 23-37 mins  Rosalio Loud, MS Acute Rehabilitation Department Office# [phone]   Selinda Flavin 05/01/2022, 3:46 PM

## 2022-05-01 NOTE — Progress Notes (Signed)
Pt discharged to [facility] nursing facility. Left unit on stretcher by EMS. Left in stable condition. Report called to [facility] and given to [place], [employer]. All of nurse's questions answered to her satisfaction.

## 2022-05-01 NOTE — ED Provider Notes (Signed)
[place] EMERGENCY DEPARTMENT AT [facility] Provider Note   CSN: [id number] Arrival date & time: 05/01/22  1930     History  Chief Complaint  Patient presents with   Altered Mental Status    Darryl Ward is a 77 y.o. 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  Per chart review, patient was discharged this morning after an admission from 04/27/2022 to 05/01/2022 for right femoral fracture with impaction/angulation status post right hip hemiarthroplasty on 04/28/2022.  Plan for SNF placement also had likely postoperative anemia with hemoglobin 11.4 initially with nadir 8.4, today 8.8.  Was [facility].  Patient has reportedly been confused, altered, per the partner [name]. She is not initially at bedside for evaluation. Patient does not know what year it is, unclear which city we are in. States he is in mild pain in his R hip where the surgery was. After thinking for several seconds he states his partner is [name]. He denies feeling confused but he seems slow to respond to questions. Denies new numbness/tingling asymmetric weakness, slurred speech, trouble with word finding, f/c, CP, SOB.   HPI     Home Medications Prior to Admission medications   Medication Sig Start Date End Date Taking? Authorizing Provider  acetaminophen (TYLENOL) 500 MG tablet Take 2 tablets (1,000 mg total) by mouth every 6 (six) hours as needed. Patient not taking: Reported on 04/30/2022 11/28/21   Barrett, Rae RoamErin R, PA-C  albuterol (VENTOLIN HFA) 108 (90 Base) MCG/ACT inhaler Inhale 2 puffs into the lungs every 6 (six) hours as needed for wheezing or shortness of breath. 01/23/22   Leslye PeerByrum, Robert S, MD  apixaban (ELIQUIS) 5 MG TABS tablet Take 1 tablet (5 mg total) by mouth 2 (two) times daily. 04/22/22    Doreatha MassedKatragadda, Sreedhar, MD  atorvastatin (LIPITOR) 80 MG tablet Take 80 mg by mouth daily.    [provider]  cyclobenzaprine (FLEXERIL) 10 MG tablet Take 1 tablet (10 mg total) by mouth 2 (two) times daily as needed. Patient not taking: Reported on 04/30/2022 04/05/21     escitalopram (LEXAPRO) 10 MG tablet Take 1 tablet (10 mg total) by mouth daily. 03/11/22   Doreatha MassedKatragadda, Sreedhar, MD  folic acid (FOLVITE) 1 MG tablet Take 1 tablet (1 mg total) by mouth daily. Patient not taking: Reported on 04/30/2022 02/12/22   Doreatha MassedKatragadda, Sreedhar, MD  furosemide (LASIX) 20 MG tablet Take 1 tablet (20 mg total) by mouth daily as needed. Patient taking differently: Take 20 mg by mouth every other day. 10/17/21   Doreatha MassedKatragadda, Sreedhar, MD  HYDROcodone-acetaminophen (NORCO/VICODIN) 5-325 MG tablet Take 1-2 tablets by mouth every 6 (six) hours as needed for severe pain. 04/30/22 04/30/23  Jenne PaneGawne, Meghan M, PA-C  lidocaine-prilocaine (EMLA) cream Apply a small amount to port a cath site and cover with plastic wrap 1 hour prior to infusion appointments 08/02/21   Doreatha MassedKatragadda, Sreedhar, MD  Magnesium Oxide 400 MG CAPS Take 1 capsule (400 mg total) by mouth daily. 12/27/21   Sharlene Doryonte, Tessa N, PA-C  metoprolol succinate (TOPROL-XL) 50 MG 24 hr tablet Take 1.5 tablets (75 mg total) by mouth daily. Take with or immediately following a meal. 12/27/21   Sharlene Doryonte, Tessa N, PA-C  pantoprazole (PROTONIX) 40 MG tablet Take 1 tablet (40 mg total) by mouth daily for 14 days.  05/01/22 05/15/22  Pokhrel, Rebekah Chesterfield, MD  polyethylene glycol (MIRALAX / GLYCOLAX) 17 g packet Take 17 g by mouth daily as needed for mild constipation or moderate constipation. 05/01/22   Pokhrel, Rebekah Chesterfield, MD  prochlorperazine (COMPAZINE) 10 MG tablet Take 1 tablet (10 mg total) by mouth every 6 (six) hours as needed for nausea/vomiting Patient not taking: Reported on 04/30/2022 08/02/21   [name], MD  Tiotropium Bromide Monohydrate (SPIRIVA RESPIMAT) 2.5 MCG/ACT AERS  Inhale 2 puffs into the lungs daily. 01/23/22   Leslye Peer, MD      Allergies    Patient has no known allergies.    Review of Systems   Review of Systems  Unable to perform ROS: Mental status change   Physical Exam Updated Vital Signs BP 122/64   Pulse 66   Temp 98.7 F (37.1 C) (Oral)   Resp 15   SpO2 100%  Physical Exam General: Normal appearing male, lying in bed.  HEENT: PERRLA, EOMI, no nystagmus, Sclera anicteric, MMM, trachea midline.  Cardiology: RRR, no murmurs/rubs/gallops. BL radial and DP pulses equal bilaterally.  Resp: Normal respiratory rate and effort. CTAB, no wheezes, rhonchi, crackles.  Abd: Soft, non-tender, non-distended. No rebound tenderness or guarding.  GU: Deferred. MSK: Healing incision R femur without erythema/induration/fluctuance. Soft compartment. No peripheral edema or signs of trauma. Extremities without deformity or TTP. No cyanosis or clubbing. Skin: warm, dry. No rashes or lesions. Back: No CVA tenderness Neuro: A&Ox4, though patient requires prompting and seems to answer very slowly. CNs II-XII grossly intact. 5/5 strength in all extremities. Sensation grossly intact.  Psych: Normal mood and affect.   ED Results / Procedures / Treatments   Labs (all labs ordered are listed, but only abnormal results are displayed) Labs Reviewed  CBC - Abnormal; Notable for the following components:      Result Value   RBC 2.89 (*)    Hemoglobin 8.9 (*)    HCT 27.6 (*)    Platelets 139 (*)    All other components within normal limits  URINALYSIS, ROUTINE W REFLEX MICROSCOPIC - Abnormal; Notable for the following components:   Hgb urine dipstick SMALL (*)    Bacteria, UA RARE (*)    All other components within normal limits  COMPREHENSIVE METABOLIC PANEL - Abnormal; Notable for the following components:   Glucose, Bld 110 (*)    Calcium 8.8 (*)    Total Protein 6.1 (*)    Albumin 3.0 (*)    All other components within normal limits  RAPID URINE  DRUG SCREEN, HOSP PERFORMED - Abnormal; Notable for the following components:   Opiates POSITIVE (*)    All other components within normal limits  CBG MONITORING, ED - Abnormal; Notable for the following components:   Glucose-Capillary 101 (*)    All other components within normal limits  RESP PANEL BY RT-PCR (RSV, FLU A&B, COVID)  RVPGX2  MAGNESIUM  TSH  T4, FREE  ETHANOL  TROPONIN I (HIGH SENSITIVITY)  TROPONIN I (HIGH SENSITIVITY)    EKG None  Radiology CT Head Wo Contrast  Result Date: 05/01/2022 CLINICAL DATA:  Altered mental status and recent fall. EXAM: CT HEAD WITHOUT CONTRAST TECHNIQUE: Contiguous axial images were obtained from the base of the skull through the vertex without intravenous contrast. RADIATION DOSE REDUCTION: This exam was performed according to the departmental dose-optimization program which includes automated exposure control, adjustment of the mA and/or kV according to patient size and/or use of iterative reconstruction technique. COMPARISON:  January 25, 2020 FINDINGS: Brain: There is mild cerebral atrophy with widening of the extra-axial spaces and ventricular dilatation. There are areas of decreased attenuation within the white matter tracts of the supratentorial brain, consistent with microvascular disease changes. Chronic bilateral basal gangliar infarcts are seen. A stable right sphenoid wing meningioma is noted (axial CT images 12 through 16, CT series 2). Vascular: No hyperdense vessel or unexpected calcification. Skull: Normal. Negative for fracture or focal lesion. Sinuses/Orbits: No acute finding. Other: None. IMPRESSION: 1. No acute intracranial abnormality. 2. Generalized cerebral atrophy and microvascular disease changes of the supratentorial brain. 3. Chronic bilateral basal gangliar infarcts. 4. Stable right sphenoid wing meningioma. Electronically Signed   By: Aram Candela M.D.   On: 05/01/2022 20:53    Procedures Procedures    Medications  Ordered in ED Medications - No data to display  ED Course/ Medical Decision Making/ A&P                          Medical Decision Making Amount and/or Complexity of Data Reviewed Labs: ordered. Decision-making details documented in ED Course. Radiology: ordered. Decision-making details documented in ED Course.  Risk Decision regarding hospitalization.    This patient presents to the ED for concern of AMS, this involves an extensive number of treatment options, and is a complaint that carries with it a high risk of complications and morbidity.  I considered the following differential and admission for this acute, potentially life threatening condition.   MDM:    Ddx of acute altered mental status or encephalopathy considered but not limited to:  -Intracranial abnormalities such as ICH, hydrocephalus, head trauma - no reported falls/head trauma but will obtain CTH -Infection such as UTI, PNA - no fevers/chills, no cough/SOB, no nuchal rigidity, will obtain urine -Toxic ingestion such as opioid overdose  -Electrolyte abnormalities or hyper/hypoglycemia -Hypercarbia or hypoxia - consider hospital delirium given report of AMS while inpatient unclear whether fluctuating course -ACS or arrhythmia -Endocrine abnormality such as thyroid storm or myxedema coma   Clinical Course as of 05/01/22 2321  Wed May 01, 2022  2039 Glucose-Capillary(!): 101 [HN]  2039 Hemoglobin(!): 8.9 C/w values from today/yesterday [HN]  2106 CT Head Wo Contrast 1. No acute intracranial abnormality. 2. Generalized cerebral atrophy and microvascular disease changes of the supratentorial brain. 3. Chronic bilateral basal gangliar infarcts. 4. Stable right sphenoid wing meningioma.   [HN]  2106 Comprehensive metabolic panel(!) Unrmearkable in context of presentation [HN]  2106 Troponin I (High Sensitivity): 10 [HN]  2106 Magnesium: 1.8 [HN]  2137 Resp panel by RT-PCR (RSV, Flu A&B, Covid) Anterior Nasal  Swab Neg [HN]  2316 D/w partner at bedside. She states his LKN was 04/29/22. She was told it was hospital delirium. He has been constantly and consistently altered, lethargic, hallucinating a dog on the bed, did not recognize her multiple times (they have been together for >30 years).   Patient with w/u unrevealing for cause of AMS. Afebrile, no leukocytosis, no nuchal rigidity. Unclear cause. Consider delirium as well of course however per the partner, he has not had a fluctuating course of AMS. He has remained consistently altered since Monday. Will admit to hospitalist for further w/u.  [HN]    Clinical Course User Index [HN] Loetta Rough, MD    Labs: I Ordered, and personally interpreted labs.  The pertinent results include:  those listed above  Imaging Studies ordered: I ordered imaging studies including CTH I independently visualized  and interpreted imaging. I agree with the radiologist interpretation  Additional history obtained from chart review, partner at bedside.    Cardiac Monitoring: The patient was maintained on a cardiac monitor.  I personally viewed and interpreted the cardiac monitored which showed an underlying rhythm of: NSR  Reevaluation: After the interventions noted above, I reevaluated the patient and found that they have :stayed the same  Social Determinants of Health: Patient normally lives with partner, had been DC'd to SNF  Disposition:  Admit  Co morbidities that complicate the patient evaluation  Past Medical History:  Diagnosis Date   AAA (abdominal aortic aneurysm)    Arthritis    Cancer    Carotid artery disease    Nonobstructive   Cataract    COPD (chronic obstructive pulmonary disease)    Coronary atherosclerosis of native coronary artery    PTCA small diagonal 2007 otherwise nonobstructive CAD   Depression    Dysrhythmia    Essential hypertension, benign    Hyperlipidemia    NSTEMI (non-ST elevated myocardial infarction) 2007    Stroke 2004   TIA (transient ischemic attack) 2006     Medicines No orders of the defined types were placed in this encounter.   I have reviewed the patients home medicines and have made adjustments as needed  Problem List / ED Course: Problem List Items Addressed This Visit   None Visit Diagnoses     Disorientation    -  Primary                   This note was created using dictation software, which may contain spelling or grammatical errors.    Loetta Rough, MD 05/09/22 [phone]

## 2022-05-01 NOTE — Progress Notes (Signed)
Physical Therapy Treatment Patient Details Name: Darryl Ward MRN: [id number] DOB: [birth year] Today's Date: 05/01/2022   History of Present Illness 77 y.o. male presents to [facility] on 04/27/2022 after a fall, with R hip pain. P found to have R femoral neck fx. Pt underwent R hip hemiarthroplasty on 4/7. PMH includes PAF, HTN, combined systolic and diastolic HF, AAA repair, CVA, COPD, adenocarcinoma of R lung.    PT Comments    Pt received in supine and agreeable to session with encouragement. Pt was noted to be incontinent of bladder with wet bed pad and sheets noted. Pt able to use the urinal with cues before sitting EOB. Pt requires up to mod A +2 for bed mobility due to stiffness and difficulty sequencing. Pt able to improve power up to standing with stedy and perform weight shifts in standing. Pt continues to benefit from PT services to progress toward functional mobility goals.    Recommendations for follow up therapy are one component of a multi-disciplinary discharge planning process, led by the attending physician.  Recommendations may be updated based on patient status, additional functional criteria and insurance authorization.  Follow Up Recommendations  Can patient physically be transported by private vehicle: No    Assistance Recommended at Discharge Frequent or constant Supervision/Assistance  Patient can return home with the following A lot of help with walking and/or transfers;A lot of help with bathing/dressing/bathroom;Assistance with cooking/housework;Direct supervision/assist for financial management;Direct supervision/assist for medications management;Assist for transportation;Help with stairs or ramp for entrance   Equipment Recommendations  Wheelchair (measurements PT)    Recommendations for Other Services       Precautions / Restrictions Precautions Precautions: Fall;Posterior Hip Precaution Booklet Issued: Yes (comment) Precaution Comments: posted in  room Restrictions Weight Bearing Restrictions: Yes RLE Weight Bearing: Weight bearing as tolerated     Mobility  Bed Mobility Overal bed mobility: Needs Assistance Bed Mobility: Supine to Sit, Sit to Supine     Supine to sit: Mod assist, +2 for physical assistance Sit to supine: Mod assist, +2 for physical assistance   General bed mobility comments: Pt very stiff and difficulty to assist BLE to EOB. Pt able to move to EOB and return to supine with increased time, use of bedrails, and mod A for trunk elevation and BLE management. Dense cues for sequencing and technique    Transfers Overall transfer level: Needs assistance Equipment used: Ambulation equipment used Transfers: Sit to/from Stand Sit to Stand: Mod assist, +2 physical assistance, Min assist           General transfer comment: Pt able to stand from elevated EOB to stedy x2 initially with mod A +2 progressing to min A +2 with improved power up. Cues for hand placement and sequencing.    Ambulation/Gait             Pre-gait activities: weight shifting in stedy        Balance Overall balance assessment: Needs assistance Sitting-balance support: No upper extremity supported, Feet supported Sitting balance-Leahy Scale: Fair Sitting balance - Comments: sitting EOB   Standing balance support: Bilateral upper extremity supported, Reliant on assistive device for balance Standing balance-Leahy Scale: Poor Standing balance comment: with stedy support                            Cognition Arousal/Alertness: Awake/alert Behavior During Therapy: WFL for tasks assessed/performed Overall Cognitive Status: Impaired/Different from baseline  General Comments: Required redirection to mobility tasks due to intermittent distraction.        Exercises      General Comments General comments (skin integrity, edema, etc.): HR up to 140's with standing, however  recovers to 90's when seated      Pertinent Vitals/Pain Pain Assessment Pain Assessment: Faces Faces Pain Scale: Hurts little more Pain Location: RLE hip Pain Descriptors / Indicators: Discomfort, Aching, Guarding Pain Intervention(s): Limited activity within patient's tolerance, Monitored during session     PT Goals (current goals can now be found in the care plan section) Acute Rehab PT Goals Patient Stated Goal: to reduce pain, restore independence PT Goal Formulation: With patient Time For Goal Achievement: 05/13/22 Potential to Achieve Goals: Fair Progress towards PT goals: Progressing toward goals    Frequency    Min 3X/week      PT Plan Current plan remains appropriate       AM-PAC PT "6 Clicks" Mobility   Outcome Measure  Help needed turning from your back to your side while in a flat bed without using bedrails?: A Lot Help needed moving from lying on your back to sitting on the side of a flat bed without using bedrails?: Total Help needed moving to and from a bed to a chair (including a wheelchair)?: A Lot (with stedy) Help needed standing up from a chair using your arms (e.g., wheelchair or bedside chair)?: A Lot Help needed to walk in hospital room?: Total Help needed climbing 3-5 steps with a railing? : Total 6 Click Score: 9    End of Session Equipment Utilized During Treatment: Gait belt Activity Tolerance: Patient tolerated treatment well Patient left: with call bell/phone within reach;with bed alarm set;in bed Nurse Communication: Mobility status PT Visit Diagnosis: Other abnormalities of gait and mobility (R26.89);Muscle weakness (generalized) (M62.81);Pain     Time: 1011-1030 PT Time Calculation (min) (ACUTE ONLY): 19 min  Charges:  $Therapeutic Activity: 8-22 mins                     Johny Shock, PTA Acute Rehabilitation Services Secure Chat Preferred  [phone]    Johny Shock 05/01/2022, 10:52 AM

## 2022-05-01 NOTE — Progress Notes (Signed)
Mobility Specialist Progress Note   05/01/22 1400  Mobility  Activity Moved into chair position in bed (Bed level exercise)  Level of Assistance Standby assist, set-up cues, supervision of patient - no hands on  Assistive Device None  Range of Motion/Exercises Active;All extremities   Patient received in supine, deferred OOB stating he worked with therapy already this morning. Agreeable to participate in bed level exercise. Completed LE exercise and UE AROM with supervision. Performed P-ROM for RLE with emphasis to adhere to posterior hip precautions. Tolerated without complaint or incident. Was left in supine with all needs met, call bell in reach.   [place] Jonessa Triplett, BS EXP Mobility Specialist Please contact via SecureChat or Rehab office at [phone]

## 2022-05-01 NOTE — TOC Transition Note (Signed)
Transition of Care [facility]) - CM/SW Discharge Note   Patient Details  Name: Darryl Ward MRN: [id number] Date of Birth: 24-Jun-1945  [facility] CM/SW Contact:  Lorri Frederick, LCSW Phone Number: 05/01/2022, 12:41 PM   Clinical Narrative:   Pt discharging to [place], room 212.  RN call report to [phone].    Final next level of care: Skilled Nursing Facility Barriers to Discharge: Barriers Resolved   Patient Goals and CMS Choice CMS Medicare.gov Compare Post Acute Care list provided to:: Patient Represenative (must comment) Katharine Look) Choice offered to / list presented to : [facility] POA / Guardian  Discharge Placement                Patient chooses bed at: [facility] and Rehab Patient to be transferred to facility by: [facility] Name of family member notified: [name] POA Patient and family notified of of transfer: 05/01/22  Discharge Plan and Services Additional resources added to the After Visit Summary for   In-house Referral: Clinical Social Work   Post Acute Care Choice: Skilled Nursing Facility                               Social Determinants of Health (SDOH) Interventions SDOH Screenings   Food Insecurity: No Food Insecurity (11/29/2021)  Housing: Low Risk  (11/29/2021)  Transportation Needs: No Transportation Needs (11/29/2021)  Utilities: Not At Risk (11/29/2021)  Depression (PHQ2-9): Medium Risk (03/27/2022)  Financial Resource Strain: Low Risk  (11/29/2019)  Physical Activity: Inactive (11/29/2019)  Social Connections: Moderately Isolated (11/29/2019)  Stress: No Stress Concern Present (11/29/2019)  Tobacco Use: Medium Risk (04/30/2022)     Readmission Risk Interventions     No data to display

## 2022-05-01 NOTE — H&P (Signed)
History and Physical  Darryl Ward UJW:[id number] DOB: 1945/04/02 DOA: 05/01/2022  Referring physician: Dr. Jearld Fenton, EDP  PCP: [name], DO  Outpatient Specialists: Medical oncology, cardiology. Patient coming from: SNF.  Chief Complaint: Altered mental status.  HPI: Darryl Ward is a 77 y.o. 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  Per the patient's partner for 40 years at bedside the patient has been unable to recognize her.  Also reports visual hallucinations.  The patient was sent back to the ED for further evaluation.  In the ED, on exam, the patient is hypersomnolent, arousable to voices, altered, unable to recognize his partner at bedside.  VBG ordered to assess for his pCO2 and pH, noncontrast brain MRI also ordered to rule out any acute structural brain abnormalities.  UA was negative for pyuria.  CT head was nonacute.  CBC and chemistry panel were essentially unremarkable.  High-sensitivity troponin negative x 2.  TRH was asked to admit.  The patient was admitted by [facility], hospitalist service.  ED Course: Tmax 98.8.  BP 160/58, pulse 65, respiratory 18, saturation 99% on room air.  Lab studies markable for serum albumin 3.0.  Hemoglobin 8.9 from 8.8.  Platelet count 139.  Review of Systems: Review of systems as noted in the HPI. All other systems reviewed and are negative.   Past Medical History:  Diagnosis Date   AAA (abdominal aortic aneurysm)    Arthritis    Cancer    Carotid artery disease    Nonobstructive   Cataract    COPD (chronic obstructive pulmonary disease)    Coronary atherosclerosis of native coronary artery    PTCA small  diagonal 2007 otherwise nonobstructive CAD   Depression    Dysrhythmia    Essential hypertension, benign    Hyperlipidemia    NSTEMI (non-ST elevated myocardial infarction) 2007   Stroke 2004   TIA (transient ischemic attack) 2006   Past Surgical History:  Procedure Laterality Date   AORTA - BILATERAL FEMORAL ARTERY BYPASS GRAFT  01/08/2012   Procedure: AORTA BIFEMORAL BYPASS GRAFT;  Surgeon: Sherren Kerns, MD;  Location: [facility] OR;  Service: Vascular;  Laterality: Bilateral;  using 18x87mm x 40cm Hemashield Gold Vascular Graft with Endarterectomy, Thombectomy and  Reimplantation of Inferior Mesenteric Artery   BACK SURGERY  2021   BRONCHIAL BIOPSY  06/11/2021   Procedure: BRONCHIAL BIOPSIES;  Surgeon: Leslye Peer, MD;  Location: [facility] ENDOSCOPY;  Service: Pulmonary;;   BRONCHIAL BRUSHINGS  06/11/2021   Procedure: BRONCHIAL BRUSHINGS;  Surgeon: Leslye Peer, MD;  Location: [facility] ENDOSCOPY;  Service: Pulmonary;;   BRONCHIAL NEEDLE ASPIRATION BIOPSY  06/11/2021   Procedure: BRONCHIAL NEEDLE ASPIRATION BIOPSIES;  Surgeon: Leslye Peer, MD;  Location: [facility] ENDOSCOPY;  Service: Pulmonary;;   CARDIOVERSION N/A 09/20/2021   Procedure: CARDIOVERSION;  Surgeon: [name], MD;  Location: [facility] ORS;  Service: Cardiovascular;  Laterality: N/A;   COLONOSCOPY N/A 04/14/2019   Procedure: COLONOSCOPY;  Surgeon: Corbin Ade, MD;  Location: [facility] ENDO SUITE;  Service: Endoscopy;  Laterality: N/A;  9:30   COLONOSCOPY WITH PROPOFOL N/A 07/25/2021   Procedure: COLONOSCOPY WITH PROPOFOL;  Surgeon: Lanelle Bal, DO;  Location: [facility] ENDO SUITE;  Service: Endoscopy;  Laterality: N/A;  1:00pm  FIDUCIAL MARKER PLACEMENT  06/11/2021   Procedure: FIDUCIAL MARKER PLACEMENT;  Surgeon: Leslye PeerByrum, Robert S, MD;  [facility];  Service: Pulmonary;;   HIP ARTHROPLASTY Right 04/28/2022   Procedure: ARTHROPLASTY BIPOLAR HIP (HEMIARTHROPLASTY);  Surgeon: Sheral ApleyMurphy, Timothy D, MD;  [facility];  Service:  Orthopedics;  Laterality: Right;   INTERCOSTAL NERVE BLOCK Right 11/12/2021   Procedure: INTERCOSTAL NERVE BLOCK;  Surgeon: Loreli SlotHendrickson, Steven C, MD;  [facility];  Service: Thoracic;  Laterality: Right;   IR IMAGING GUIDED PORT INSERTION  07/31/2021   Left cataract surgery     LYMPH NODE DISSECTION Right 11/12/2021   Procedure: LYMPH NODE DISSECTION;  Surgeon: Loreli SlotHendrickson, Steven C, MD;  [facility];  Service: Thoracic;  Laterality: Right;   POLYPECTOMY  04/14/2019   Procedure: POLYPECTOMY;  Surgeon: Corbin Adeourk, Robert M, MD;  [facility];  Service: Endoscopy;;   POLYPECTOMY  07/25/2021   Procedure: POLYPECTOMY;  Surgeon: Lanelle Balarver, Charles K, DO;  [facility];  Service: Endoscopy;;   TEE WITHOUT CARDIOVERSION N/A 09/20/2021   Procedure: TRANSESOPHAGEAL ECHOCARDIOGRAM (TEE);  Surgeon: Jonelle SidleMcDowell, Samuel G, MD;  Location: [facility] ORS;  Service: Cardiovascular;  Laterality: N/A;   TRANSFORAMINAL LUMBAR INTERBODY FUSION (TLIF) WITH PEDICLE SCREW FIXATION 1 LEVEL N/A 04/27/2020   Procedure: Transforaminal Lumbar Interbody Fusion Lumbar Five-Sacral One;  Surgeon: Bedelia Personhomas, Jonathan G, MD;  [facility];  Service: Neurosurgery;  Laterality: N/A;   VIDEO BRONCHOSCOPY WITH INSERTION OF INTERBRONCHIAL VALVE (IBV) N/A 11/22/2021   Procedure: VIDEO BRONCHOSCOPY WITH INSERTION OF INTERBRONCHIAL VALVE (IBV);  Surgeon: Loreli SlotHendrickson, Steven C, MD;  [facility];  Service: Thoracic;  Laterality: N/A;   VIDEO BRONCHOSCOPY WITH INSERTION OF INTERBRONCHIAL VALVE (IBV) N/A 01/24/2022   Procedure: VIDEO BRONCHOSCOPY WITH REMOVAL OF INTERBRONCHIAL VALVE (IBV);  Surgeon: Loreli SlotHendrickson, Steven C, MD;  [facility];  Service: Thoracic;  Laterality: N/A;   VIDEO BRONCHOSCOPY WITH RADIAL ENDOBRONCHIAL ULTRASOUND  06/11/2021   Procedure: VIDEO BRONCHOSCOPY WITH RADIAL ENDOBRONCHIAL ULTRASOUND;  Surgeon: Leslye PeerByrum, Robert S, MD;  Location: [facility] ENDOSCOPY;  Service: Pulmonary;;    Social History:  reports  that he quit smoking about 9 months ago. His smoking use included cigarettes. He has a 40.00 pack-year smoking history. He has never used smokeless tobacco. He reports that he does not drink alcohol and does not use drugs.   No Known Allergies  Family History  Problem Relation Age of Onset   Hyperlipidemia Sister    Heart attack Brother 2352   Cancer - Colon Neg Hx       Prior to Admission medications   Medication Sig Start Date End Date Taking? Authorizing Provider  acetaminophen (TYLENOL) 500 MG tablet Take 2 tablets (1,000 mg total) by mouth every 6 (six) hours as needed. 11/28/21   Barrett, Erin R, PA-C  albuterol (VENTOLIN HFA) 108 (90 Base) MCG/ACT inhaler Inhale 2 puffs into the lungs every 6 (six) hours as needed for wheezing or shortness of breath. 01/23/22   Leslye PeerByrum, Robert S, MD  apixaban (ELIQUIS) 5 MG TABS tablet Take 1 tablet (5 mg total) by mouth 2 (two) times daily. 04/22/22   Doreatha MassedKatragadda, Sreedhar, MD  atorvastatin (LIPITOR) 80 MG tablet Take 80 mg by mouth daily.    [provider]  cyclobenzaprine (FLEXERIL) 10 MG tablet Take 1 tablet (10 mg total) by mouth 2 (two) times daily as needed. 04/05/21     escitalopram (LEXAPRO) 10 MG tablet Take 1 tablet (10 mg total) by mouth daily. 03/11/22  [name], MD  folic acid (FOLVITE) 1 MG tablet Take 1 tablet (1 mg total) by mouth daily. 02/12/22   [name], MD  furosemide (LASIX) 20 MG tablet Take 1 tablet (20 mg total) by mouth daily as needed. Patient taking differently: Take 20 mg by mouth every other day. 10/17/21   [name], MD  HYDROcodone-acetaminophen (NORCO/VICODIN) 5-325 MG tablet Take 1-2 tablets by mouth every 6 (six) hours as needed for severe pain. 04/30/22 04/30/23  Jenne Pane, PA-C  lidocaine-prilocaine (EMLA) cream Apply a small amount to port a cath site and cover with plastic wrap 1 hour prior to infusion appointments 08/02/21   [name], MD  Magnesium Oxide 400 MG CAPS  Take 1 capsule (400 mg total) by mouth daily. 12/27/21   Sharlene Dory, PA-C  metoprolol succinate (TOPROL-XL) 50 MG 24 hr tablet Take 1.5 tablets (75 mg total) by mouth daily. Take with or immediately following a meal. 12/27/21   Sharlene Dory, PA-C  pantoprazole (PROTONIX) 40 MG tablet Take 1 tablet (40 mg total) by mouth daily for 14 days. 05/01/22 05/15/22  Pokhrel, Rebekah Chesterfield, MD  polyethylene glycol (MIRALAX / GLYCOLAX) 17 g packet Take 17 g by mouth daily as needed for mild constipation or moderate constipation. 05/01/22   Pokhrel, Rebekah Chesterfield, MD  prochlorperazine (COMPAZINE) 10 MG tablet Take 1 tablet (10 mg total) by mouth every 6 (six) hours as needed for nausea/vomiting 08/02/21   [name], MD  Tiotropium Bromide Monohydrate (SPIRIVA RESPIMAT) 2.5 MCG/ACT AERS Inhale 2 puffs into the lungs daily. 01/23/22   Leslye Peer, MD    Physical Exam: BP 122/64   Pulse 66   Temp 98.7 F (37.1 C) (Oral)   Resp 15   SpO2 100%   General: 77 y.o. year-old male well developed well nourished in no acute distress.  Somnolent but arousable to voices.  Confused. Cardiovascular: Regular rate and rhythm with no rubs or gallops.  No thyromegaly or JVD noted.  No lower extremity edema. 2/4 pulses in all 4 extremities. Respiratory: Clear to auscultation with no wheezes or rales.  Poor inspiratory effort. Abdomen: Soft nontender nondistended with normal bowel sounds x4 quadrants. Muskuloskeletal: No cyanosis, clubbing or edema noted bilaterally Neuro: CN II-XII intact, strength, sensation, reflexes Skin: No ulcerative lesions noted or rashes Psychiatry: Unable to assess judgment or mood due to somnolence.         Labs on Admission:  Basic Metabolic Panel: Recent Labs  Lab 04/27/22 2129 04/30/22 0254 05/01/22 0658 05/01/22 1945  NA 141 137 135 136  K 4.3 4.1 3.7 4.0  CL 104 109 106 104  CO2 24 25 23 26   GLUCOSE 99 99 98 110*  BUN 14 17 15 19   CREATININE 1.31* 0.99 0.96 0.97  CALCIUM 9.3  8.4* 8.6* 8.8*  MG  --  1.9  --  1.8   Liver Function Tests: Recent Labs  Lab 05/01/22 1945  AST 29  ALT 11  ALKPHOS 54  BILITOT 0.6  PROT 6.1*  ALBUMIN 3.0*   No results for input(s): "LIPASE", "AMYLASE" in the last 168 hours. No results for input(s): "AMMONIA" in the last 168 hours. CBC: Recent Labs  Lab 04/27/22 2129 04/30/22 0254 04/30/22 1405 04/30/22 1728 05/01/22 0658 05/01/22 1945  WBC 8.0 9.0  --   --   --  9.0  NEUTROABS 5.4  --   --   --   --   --   HGB 11.4* 8.4* 8.7* 9.2*  8.8* 8.9*  HCT 36.0* 26.0* 26.0* 27.5* 27.1* 27.6*  MCV 96.0 94.9  --   --   --  95.5  PLT 131* 104*  --   --   --  139*   Cardiac Enzymes: No results for input(s): "CKTOTAL", "CKMB", "CKMBINDEX", "TROPONINI" in the last 168 hours.  BNP (last 3 results) Recent Labs    12/04/21 2052  BNP 62.8    ProBNP (last 3 results) No results for input(s): "PROBNP" in the last 8760 hours.  CBG: Recent Labs  Lab 04/27/22 2136 05/01/22 2031  GLUCAP 94 101*    Radiological Exams on Admission: CT Head Wo Contrast  Result Date: 05/01/2022 CLINICAL DATA:  Altered mental status and recent fall. EXAM: CT HEAD WITHOUT CONTRAST TECHNIQUE: Contiguous axial images were obtained from the base of the skull through the vertex without intravenous contrast. RADIATION DOSE REDUCTION: This exam was performed according to the departmental dose-optimization program which includes automated exposure control, adjustment of the mA and/or kV according to patient size and/or use of iterative reconstruction technique. COMPARISON:  January 25, 2020 FINDINGS: Brain: There is mild cerebral atrophy with widening of the extra-axial spaces and ventricular dilatation. There are areas of decreased attenuation within the white matter tracts of the supratentorial brain, consistent with microvascular disease changes. Chronic bilateral basal gangliar infarcts are seen. A stable right sphenoid wing meningioma is noted (axial CT images  12 through 16, CT series 2). Vascular: No hyperdense vessel or unexpected calcification. Skull: Normal. Negative for fracture or focal lesion. Sinuses/Orbits: No acute finding. Other: None. IMPRESSION: 1. No acute intracranial abnormality. 2. Generalized cerebral atrophy and microvascular disease changes of the supratentorial brain. 3. Chronic bilateral basal gangliar infarcts. 4. Stable right sphenoid wing meningioma. Electronically Signed   By: Aram Candela M.D.   On: 05/01/2022 20:53    EKG: I independently viewed the EKG done and my findings are as followed: None available at the time of this visit.  Assessment/Plan Present on Admission:  AMS (altered mental status)  Principal Problem:   AMS (altered mental status)  Acute metabolic encephalopathy: Unclear etiology Follow-up VBG to rule out CO2 retention Follow brain MRI to rule out any acute intra cranial abnormalities. Treat underlying conditions Reorient as needed Fall, aspiration, delirium precautions.  Paroxysmal A-fib on Eliquis Resume home Eliquis BPs are soft, currently, home metoprolol on hold to avoid hypotension. Consider restarting home beta-blocker once blood pressure is improved Closely monitor on telemetry.  Chronic anxiety/depression Resume home Lexapro  History of adenocarcinoma of the right lung on immunotherapy Last Keytruda infusion was on 04/22/2022 Follows with Dr. Ellin Saba, medical oncology  GERD Resume home PPI  Anemia of chronic disease No reported overt bleeding Hemoglobin 8.9, baseline of 10. Continue to monitor H&H   DVT prophylaxis: Home Eliquis  Code Status: Full code as stated by his partner at bedside.  Family Communication: Updated his partner at bedside.  Disposition Plan: Admitted to telemetry unit  Consults called: None.  Admission status: Observation status.   Status is: Observation    Darlin Drop MD [facility] Pager [phone]  If 7PM-7AM, please  contact night-coverage [email or web address] Password [facility]  05/01/2022, 11:39 PM

## 2022-05-02 ENCOUNTER — Observation Stay (HOSPITAL_COMMUNITY): Payer: HMO

## 2022-05-02 DIAGNOSIS — R4182 Altered mental status, unspecified: Secondary | ICD-10-CM | POA: Diagnosis not present

## 2022-05-02 LAB — BLOOD GAS, VENOUS
Acid-base deficit: 1.1 mmol/L (ref 0.0–2.0)
Bicarbonate: 24.3 mmol/L (ref 20.0–28.0)
O2 Saturation: 46 %
Patient temperature: 37
pCO2, Ven: 42 mmHg — ABNORMAL LOW (ref 44–60)
pH, Ven: 7.37 (ref 7.25–7.43)
pO2, Ven: <31 mmHg — CL (ref 32–45)

## 2022-05-02 LAB — T4, FREE: Free T4: 1.46 ng/dL — ABNORMAL HIGH (ref 0.61–1.12)

## 2022-05-02 LAB — ETHANOL: Alcohol, Ethyl (B): <10 mg/dL (ref <10)

## 2022-05-02 MED ORDER — FOLIC ACID 1 MG PO TABS
1.0000 mg | ORAL_TABLET | Freq: Every day | ORAL | Status: DC
  Administered 2022-05-02 – 2022-05-04 (×3): 1 mg via ORAL
  Filled 2022-05-02 (×3): qty 1

## 2022-05-02 MED ORDER — APIXABAN 5 MG PO TABS
5.0000 mg | ORAL_TABLET | Freq: Two times a day (BID) | ORAL | Status: DC
  Administered 2022-05-02 – 2022-05-04 (×5): 5 mg via ORAL
  Filled 2022-05-02 (×5): qty 1

## 2022-05-02 MED ORDER — MAGNESIUM OXIDE -MG SUPPLEMENT 400 (240 MG) MG PO TABS
400.0000 mg | ORAL_TABLET | Freq: Every day | ORAL | Status: DC
  Administered 2022-05-02 – 2022-05-04 (×3): 400 mg via ORAL
  Filled 2022-05-02 (×3): qty 1

## 2022-05-02 MED ORDER — PANTOPRAZOLE SODIUM 40 MG PO TBEC
40.0000 mg | DELAYED_RELEASE_TABLET | Freq: Every day | ORAL | Status: DC
  Administered 2022-05-02 – 2022-05-04 (×3): 40 mg via ORAL
  Filled 2022-05-02 (×3): qty 1

## 2022-05-02 MED ORDER — ESCITALOPRAM OXALATE 10 MG PO TABS
10.0000 mg | ORAL_TABLET | Freq: Every day | ORAL | Status: DC
  Administered 2022-05-02 – 2022-05-04 (×3): 10 mg via ORAL
  Filled 2022-05-02 (×3): qty 1

## 2022-05-02 MED ORDER — UMECLIDINIUM BROMIDE 62.5 MCG/ACT IN AEPB
1.0000 | INHALATION_SPRAY | Freq: Every day | RESPIRATORY_TRACT | Status: DC
  Administered 2022-05-02 – 2022-05-04 (×3): 1 via RESPIRATORY_TRACT
  Filled 2022-05-02: qty 7

## 2022-05-02 MED ORDER — ALBUTEROL SULFATE (2.5 MG/3ML) 0.083% IN NEBU: 2.5000 mg | INHALATION_SOLUTION | Freq: Four times a day (QID) | RESPIRATORY_TRACT | Status: DC | PRN

## 2022-05-02 MED ORDER — TIOTROPIUM BROMIDE MONOHYDRATE 2.5 MCG/ACT IN AERS: 2.0000 | INHALATION_SPRAY | Freq: Every day | RESPIRATORY_TRACT | Status: DC

## 2022-05-02 MED ORDER — ATORVASTATIN CALCIUM 40 MG PO TABS
80.0000 mg | ORAL_TABLET | Freq: Every day | ORAL | Status: DC
  Administered 2022-05-02 – 2022-05-04 (×3): 80 mg via ORAL
  Filled 2022-05-02 (×3): qty 2

## 2022-05-02 MED ORDER — HYDROCODONE-ACETAMINOPHEN 5-325 MG PO TABS: 1.0000 | ORAL_TABLET | Freq: Four times a day (QID) | ORAL | 0 refills | Status: DC | PRN

## 2022-05-02 MED ORDER — ALBUTEROL SULFATE HFA 108 (90 BASE) MCG/ACT IN AERS: 2.0000 | INHALATION_SPRAY | Freq: Four times a day (QID) | RESPIRATORY_TRACT | Status: DC | PRN

## 2022-05-02 NOTE — Discharge Summary (Signed)
Physician Discharge Summary   Darryl Ward R Duclos WUJ:[id number] DOB: 12/07/1945 DOA: 05/01/2022  PCP: Tommie Samsook, Jayce G, DO  Admit date: 05/01/2022 Discharge date: 05/02/2022  [facility]  [facility] Discharging physician: Lewie Chamberavid Kinneth Fujiwara, MD Barriers to discharge: none  Recommendations at discharge: Continue delirium precautions Hgb stable; can recheck Hgb in 3-4 days if needed Follow up with ortho, Dr. Eulah PontMurphy  Discharge Condition: stable CODE STATUS: Full Diet recommendation:  Diet Orders (From admission, onward)     Start     Ordered   05/02/22 1113  Diet regular Room service appropriate? Yes; Fluid consistency: Thin  Diet effective now       Question Answer Comment  Room service appropriate? Yes   Fluid consistency: Thin      05/02/22 1112   05/02/22 0000  Diet general        05/02/22 1233            Hospital Course: Acute metabolic encephalopathy due to delirium - expected delirium in setting of advanced age, hospitalization, and hx CVAs with known cerebral atrophy and microvascular changes -Continue delirium precautions as necessary at SNF -CT head and MRI brain performed on admission which showed no acute abnormalities.  Underlying chronic changes noted   Right hip fracture Orthopedics was consulted and patient underwent  right hip hemiarthroplasty on 04/28/2022.   Orthopedic recommended weightbearing as tolerated on the right lower extremity and follow-up with Dr. Eulah PontMurphy in 2 weeks.  Patient has been determined for skilled nursing facility placement.   Acute blood loss anemia Likely postoperative anemia.    No obvious hematoma noted.   -Right hip compartments soft and no concern for bleeding -Hemoglobin has remained stable, 8.9 g/dL on admission - expected slow improvement as patient post op from ortho surgery   Chronic combined systolic and diastolic CHF (congestive heart failure) On Lasix at 20 mg daily as needed at home.  Will resume on  discharge.   History of stroke Has been started on Eliquis since 04/29/2022.    PAF (paroxysmal atrial fibrillation) Rate controlled.  On metoprolol, Eliquis on discharge.   Essential hypertension Controlled.  Patient is on metoprolol from home.  Blood pressure is stable.   AKI (acute kidney injury) Resolved.  Will be restarted on Lasix on discharge.   primary adenocarcinoma of upper lobe of right lung -has underwent 3 cycles of pembrolizumab. Last cycle on 04/22/22. Follows up with Dr. Ellin SabaKatragadda with [facility], currently appears to be stable.   COPD (chronic obstructive pulmonary disease) - Stable, not in exacerbation, continue bronchodilators on discharge.   Principal Diagnosis: AMS (altered mental status)  Discharge Diagnoses: Active Hospital Problems   Diagnosis Date Noted   Chronic combined systolic and diastolic CHF (congestive heart failure) 03/27/2022   History of stroke 03/27/2022   PAF (paroxysmal atrial fibrillation) 12/23/2021   Essential hypertension 09/30/2021   Mixed hyperlipidemia 12/13/2011   Coronary atherosclerosis of native coronary artery 12/13/2011    Resolved Hospital Problems   Diagnosis Date Noted Date Resolved   AMS (altered mental status) 05/01/2022 05/02/2022    Priority: 1.     Discharge Instructions     Diet general   Complete by: As directed    Increase activity slowly   Complete by: As directed       Allergies as of 05/02/2022   No Known Allergies      Medication List     STOP taking these medications    lidocaine-prilocaine cream Commonly known as: EMLA  TAKE these medications    acetaminophen 500 MG tablet Commonly known as: TYLENOL Take 2 tablets (1,000 mg total) by mouth every 6 (six) hours as needed.   albuterol 108 (90 Base) MCG/ACT inhaler Commonly known as: VENTOLIN HFA Inhale 2 puffs into the lungs every 6 (six) hours as needed for wheezing or shortness of breath.   atorvastatin 80 MG  tablet Commonly known as: LIPITOR Take 80 mg by mouth daily.   cyclobenzaprine 10 MG tablet Commonly known as: FLEXERIL Take 1 tablet (10 mg total) by mouth 2 (two) times daily as needed.   Eliquis 5 MG Tabs tablet Generic drug: apixaban Take 1 tablet (5 mg total) by mouth 2 (two) times daily.   escitalopram 10 MG tablet Commonly known as: LEXAPRO Take 1 tablet (10 mg total) by mouth daily.   folic acid 1 MG tablet Commonly known as: FOLVITE Take 1 tablet (1 mg total) by mouth daily.   furosemide 20 MG tablet Commonly known as: LASIX Take 1 tablet (20 mg total) by mouth daily as needed. What changed: when to take this   HYDROcodone-acetaminophen 5-325 MG tablet Commonly known as: NORCO/VICODIN Take 1-2 tablets by mouth every 6 (six) hours as needed for severe pain.   Magnesium Oxide 400 MG Caps Take 1 capsule (400 mg total) by mouth daily.   metoprolol succinate 50 MG 24 hr tablet Commonly known as: TOPROL-XL Take 1.5 tablets (75 mg total) by mouth daily. Take with or immediately following a meal.   pantoprazole 40 MG tablet Commonly known as: PROTONIX Take 1 tablet (40 mg total) by mouth daily for 14 days.   polyethylene glycol 17 g packet Commonly known as: MIRALAX / GLYCOLAX Take 17 g by mouth daily as needed for mild constipation or moderate constipation.   prochlorperazine 10 MG tablet Commonly known as: COMPAZINE Take 1 tablet (10 mg total) by mouth every 6 (six) hours as needed for nausea/vomiting   Spiriva Respimat 2.5 MCG/ACT Aers Generic drug: Tiotropium Bromide Monohydrate Inhale 2 puffs into the lungs daily.        No Known Allergies  Consultations:   Procedures:   Discharge Exam: BP 107/60 (BP Location: Right Arm)   Pulse 66   Temp 98.2 F (36.8 C) (Oral)   Resp 12   Wt 73.9 kg   SpO2 97%   BMI 23.38 kg/m  Physical Exam Constitutional:      General: He is not in acute distress.    Comments: Pleasant elderly man sitting up in  bed in no distress.  Oriented to name, president, year  HENT:     Head: Normocephalic and atraumatic.     Mouth/Throat:     Mouth: Mucous membranes are moist.  Eyes:     Extraocular Movements: Extraocular movements intact.  Cardiovascular:     Rate and Rhythm: Normal rate and regular rhythm.     Heart sounds: Normal heart sounds.  Pulmonary:     Effort: Pulmonary effort is normal. No respiratory distress.     Breath sounds: Normal breath sounds. No wheezing.  Abdominal:     General: Bowel sounds are normal. There is no distension.     Palpations: Abdomen is soft.     Tenderness: There is no abdominal tenderness.  Musculoskeletal:     Cervical back: Normal range of motion and neck supple.     Comments: Right hip compartments soft with expected tenderness to palpation.  No bruising  Skin:    General: Skin is warm  and dry.  Neurological:     General: No focal deficit present.  Psychiatric:        Mood and Affect: Mood normal.        Behavior: Behavior normal.      The results of significant diagnostics from this hospitalization (including imaging, microbiology, ancillary and laboratory) are listed below for reference.   Microbiology: Recent Results (from the past 240 hour(s))  MRSA Next Gen by PCR, Nasal     Status: None   Collection Time: 04/28/22  5:42 AM   Specimen: Nasal Mucosa; Nasal Swab  Result Value Ref Range Status   MRSA by PCR Next Gen NOT DETECTED NOT DETECTED Final    Comment: (NOTE) The GeneXpert MRSA Assay (FDA approved for NASAL specimens only), is one component of a comprehensive MRSA colonization surveillance program. It is not intended to diagnose MRSA infection nor to guide or monitor treatment for MRSA infections. Test performance is not FDA approved in patients less than 44 years old. Performed at [facility], [address] [place], [place] [address]   Resp panel by RT-PCR (RSV, Flu A&B, Covid) Anterior Nasal Swab     Status: None    Collection Time: 05/01/22  8:13 PM   Specimen: Anterior Nasal Swab  Result Value Ref Range Status   SARS Coronavirus 2 by RT PCR NEGATIVE NEGATIVE Final    Comment: (NOTE) SARS-CoV-2 target nucleic acids are NOT DETECTED.  The SARS-CoV-2 RNA is generally detectable in upper respiratory specimens during the acute phase of infection. The lowest concentration of SARS-CoV-2 viral copies this assay can detect is 138 copies/mL. A negative result does not preclude SARS-Cov-2 infection and should not be used as the sole basis for treatment or other patient management decisions. A negative result may occur with  improper specimen collection/handling, submission of specimen other than nasopharyngeal swab, presence of viral mutation(s) within the areas targeted by this assay, and inadequate number of viral copies(<138 copies/mL). A negative result must be combined with clinical observations, patient history, and epidemiological information. The expected result is Negative.  Fact Sheet for Patients:  BloggerCourse.com  Fact Sheet for Healthcare Providers:  SeriousBroker.it  This test is no t yet approved or cleared by the [place] FDA and  has been authorized for detection and/or diagnosis of SARS-CoV-2 by FDA under an Emergency Use Authorization (EUA). This EUA will remain  in effect (meaning this test can be used) for the duration of the COVID-19 declaration under Section 564(b)(1) of the Act, 21 U.S.C.section 360bbb-3(b)(1), unless the authorization is terminated  or revoked sooner.       Influenza A by PCR NEGATIVE NEGATIVE Final   Influenza B by PCR NEGATIVE NEGATIVE Final    Comment: (NOTE) The Xpert Xpress SARS-CoV-2/FLU/RSV plus assay is intended as an aid in the diagnosis of influenza from Nasopharyngeal swab specimens and should not be used as a sole basis for treatment. Nasal washings and aspirates are unacceptable for  Xpert Xpress SARS-CoV-2/FLU/RSV testing.  Fact Sheet for Patients: BloggerCourse.com  Fact Sheet for Healthcare Providers: SeriousBroker.it  This test is not yet approved or cleared by the [place] FDA and has been authorized for detection and/or diagnosis of SARS-CoV-2 by FDA under an Emergency Use Authorization (EUA). This EUA will remain in effect (meaning this test can be used) for the duration of the COVID-19 declaration under Section 564(b)(1) of the Act, 21 U.S.C. section 360bbb-3(b)(1), unless the authorization is terminated or revoked.     Resp  Syncytial Virus by PCR NEGATIVE NEGATIVE Final    Comment: (NOTE) Fact Sheet for Patients: BloggerCourse.com  Fact Sheet for Healthcare Providers: SeriousBroker.it  This test is not yet approved or cleared by the [place] FDA and has been authorized for detection and/or diagnosis of SARS-CoV-2 by FDA under an Emergency Use Authorization (EUA). This EUA will remain in effect (meaning this test can be used) for the duration of the COVID-19 declaration under Section 564(b)(1) of the Act, 21 U.S.C. section 360bbb-3(b)(1), unless the authorization is terminated or revoked.  Performed at [facility], [address], [place], [place] [address]      Labs: BNP (last 3 results) Recent Labs    12/04/21 2052  BNP 62.8   Basic Metabolic Panel: Recent Labs  Lab 04/27/22 2129 04/30/22 0254 05/01/22 0658 05/01/22 1945  NA 141 137 135 136  K 4.3 4.1 3.7 4.0  CL 104 109 106 104  CO2 24 25 23 26   GLUCOSE 99 99 98 110*  BUN 14 17 15 19   CREATININE 1.31* 0.99 0.96 0.97  CALCIUM 9.3 8.4* 8.6* 8.8*  MG  --  1.9  --  1.8   Liver Function Tests: Recent Labs  Lab 05/01/22 1945  AST 29  ALT 11  ALKPHOS 54  BILITOT 0.6  PROT 6.1*  ALBUMIN 3.0*   No results for input(s): "LIPASE", "AMYLASE" in the  last 168 hours. No results for input(s): "AMMONIA" in the last 168 hours. CBC: Recent Labs  Lab 04/27/22 2129 04/30/22 0254 04/30/22 1405 04/30/22 1728 05/01/22 0658 05/01/22 1945  WBC 8.0 9.0  --   --   --  9.0  NEUTROABS 5.4  --   --   --   --   --   HGB 11.4* 8.4* 8.7* 9.2* 8.8* 8.9*  HCT 36.0* 26.0* 26.0* 27.5* 27.1* 27.6*  MCV 96.0 94.9  --   --   --  95.5  PLT 131* 104*  --   --   --  139*   Cardiac Enzymes: No results for input(s): "CKTOTAL", "CKMB", "CKMBINDEX", "TROPONINI" in the last 168 hours. BNP: Invalid input(s): "POCBNP" CBG: Recent Labs  Lab 04/27/22 2136 05/01/22 2031  GLUCAP 94 101*   D-Dimer No results for input(s): "DDIMER" in the last 72 hours. Hgb A1c No results for input(s): "HGBA1C" in the last 72 hours. Lipid Profile No results for input(s): "CHOL", "HDL", "LDLCALC", "TRIG", "CHOLHDL", "LDLDIRECT" in the last 72 hours. Thyroid function studies Recent Labs    05/01/22 2110  TSH 1.077   Anemia work up No results for input(s): "VITAMINB12", "FOLATE", "FERRITIN", "TIBC", "IRON", "RETICCTPCT" in the last 72 hours. Urinalysis    Component Value Date/Time   COLORURINE YELLOW 05/01/2022 2125   APPEARANCEUR CLEAR 05/01/2022 2125   LABSPEC 1.015 05/01/2022 2125   PHURINE 6.0 05/01/2022 2125   GLUCOSEU NEGATIVE 05/01/2022 2125   HGBUR SMALL (A) 05/01/2022 2125   BILIRUBINUR NEGATIVE 05/01/2022 2125   KETONESUR NEGATIVE 05/01/2022 2125   PROTEINUR NEGATIVE 05/01/2022 2125   UROBILINOGEN 1.0 01/10/2012 1728   NITRITE NEGATIVE 05/01/2022 2125   LEUKOCYTESUR NEGATIVE 05/01/2022 2125   Sepsis Labs Recent Labs  Lab 04/27/22 2129 04/30/22 0254 05/01/22 1945  WBC 8.0 9.0 9.0   Microbiology Recent Results (from the past 240 hour(s))  MRSA Next Gen by PCR, Nasal     Status: None   Collection Time: 04/28/22  5:42 AM   Specimen: Nasal Mucosa; Nasal Swab  Result Value Ref Range Status  MRSA by PCR Next Gen NOT DETECTED NOT DETECTED Final     Comment: (NOTE) The GeneXpert MRSA Assay (FDA approved for NASAL specimens only), is one component of a comprehensive MRSA colonization surveillance program. It is not intended to diagnose MRSA infection nor to guide or monitor treatment for MRSA infections. Test performance is not FDA approved in patients less than 10 years old. Performed at [facility], [address] [place], [place] [address]   Resp panel by RT-PCR (RSV, Flu A&B, Covid) Anterior Nasal Swab     Status: None   Collection Time: 05/01/22  8:13 PM   Specimen: Anterior Nasal Swab  Result Value Ref Range Status   SARS Coronavirus 2 by RT PCR NEGATIVE NEGATIVE Final    Comment: (NOTE) SARS-CoV-2 target nucleic acids are NOT DETECTED.  The SARS-CoV-2 RNA is generally detectable in upper respiratory specimens during the acute phase of infection. The lowest concentration of SARS-CoV-2 viral copies this assay can detect is 138 copies/mL. A negative result does not preclude SARS-Cov-2 infection and should not be used as the sole basis for treatment or other patient management decisions. A negative result may occur with  improper specimen collection/handling, submission of specimen other than nasopharyngeal swab, presence of viral mutation(s) within the areas targeted by this assay, and inadequate number of viral copies(<138 copies/mL). A negative result must be combined with clinical observations, patient history, and epidemiological information. The expected result is Negative.  Fact Sheet for Patients:  BloggerCourse.com  Fact Sheet for Healthcare Providers:  SeriousBroker.it  This test is no t yet approved or cleared by the [place] FDA and  has been authorized for detection and/or diagnosis of SARS-CoV-2 by FDA under an Emergency Use Authorization (EUA). This EUA will remain  in effect (meaning this test can be used) for the duration of the COVID-19  declaration under Section 564(b)(1) of the Act, 21 U.S.C.section 360bbb-3(b)(1), unless the authorization is terminated  or revoked sooner.       Influenza A by PCR NEGATIVE NEGATIVE Final   Influenza B by PCR NEGATIVE NEGATIVE Final    Comment: (NOTE) The Xpert Xpress SARS-CoV-2/FLU/RSV plus assay is intended as an aid in the diagnosis of influenza from Nasopharyngeal swab specimens and should not be used as a sole basis for treatment. Nasal washings and aspirates are unacceptable for Xpert Xpress SARS-CoV-2/FLU/RSV testing.  Fact Sheet for Patients: BloggerCourse.com  Fact Sheet for Healthcare Providers: SeriousBroker.it  This test is not yet approved or cleared by the [place] FDA and has been authorized for detection and/or diagnosis of SARS-CoV-2 by FDA under an Emergency Use Authorization (EUA). This EUA will remain in effect (meaning this test can be used) for the duration of the COVID-19 declaration under Section 564(b)(1) of the Act, 21 U.S.C. section 360bbb-3(b)(1), unless the authorization is terminated or revoked.     Resp Syncytial Virus by PCR NEGATIVE NEGATIVE Final    Comment: (NOTE) Fact Sheet for Patients: BloggerCourse.com  Fact Sheet for Healthcare Providers: SeriousBroker.it  This test is not yet approved or cleared by the [place] FDA and has been authorized for detection and/or diagnosis of SARS-CoV-2 by FDA under an Emergency Use Authorization (EUA). This EUA will remain in effect (meaning this test can be used) for the duration of the COVID-19 declaration under Section 564(b)(1) of the Act, 21 U.S.C. section 360bbb-3(b)(1), unless the authorization is terminated or revoked.  Performed at [facility], [address], [place], [place] [address]  Procedures/Studies: MR BRAIN WO CONTRAST  Result Date:  05/02/2022 CLINICAL DATA:  Provided history: Delirium. Additional history provided: Recent fall. Right hip fracture. Cancer history. EXAM: MRI HEAD WITHOUT CONTRAST TECHNIQUE: Multiplanar, multiecho pulse sequences of the brain and surrounding structures were obtained without intravenous contrast. COMPARISON:  Head CT 05/01/2022. Brain MRI 07/11/2021. FINDINGS: Intermittently motion degraded examination, limiting evaluation. Most notably, the axial T2 sequence is moderate to severely motion degraded, the axial T2 FLAIR sequence is moderately motion degraded, the axial SWI sequence is severely motion degraded and the axial T1-weighted sequence is severely motion degraded. Brain: Moderate generalized cerebral atrophy. Mild cerebellar atrophy. A right sphenoid wing/paraclinoid meningioma is poorly reassessed due to the degree of motion degradation, but has not appreciably changed in size from the prior brain MRI of 07/11/2021. Redemonstrated chronic lacunar infarcts within bilateral cerebral hemispheric white matter and deep gray nuclei. Background moderate multifocal T2 FLAIR hyperintense signal abnormality within the cerebral white matter, nonspecific but compatible with chronic small vessel disease. Severe motion degradation of the axial SWI sequence limits evaluation for intracranial blood products. There is no acute infarct. No extra-axial fluid collection. No midline shift. Vascular: Maintained flow voids within the proximal large arterial vessels. Skull and upper cervical spine: No suspicious marrow lesion. Sinuses/Orbits: No mass or acute finding within the imaged orbits. Prior bilateral ocular lens replacement. No significant paranasal sinus disease. Other: Bilateral mastoid effusions. IMPRESSION: 1. Motion degraded examination. 2.  No evidence of an acute intracranial abnormality. 3. Parenchymal atrophy, chronic small vessel disease and chronic lacunar infarcts as detailed. 4. A known right sphenoid  wing/paraclinoid meningioma is poorly reassessed due to the degree of motion degradation, but has not appreciably changed in size from the prior brain MRI of 07/11/2021. 5. Bilateral mastoid effusions. Electronically Signed   By: Jackey Loge D.O.   On: 05/02/2022 12:26   CT Head Wo Contrast  Result Date: 05/01/2022 CLINICAL DATA:  Altered mental status and recent fall. EXAM: CT HEAD WITHOUT CONTRAST TECHNIQUE: Contiguous axial images were obtained from the base of the skull through the vertex without intravenous contrast. RADIATION DOSE REDUCTION: This exam was performed according to the departmental dose-optimization program which includes automated exposure control, adjustment of the mA and/or kV according to patient size and/or use of iterative reconstruction technique. COMPARISON:  January 25, 2020 FINDINGS: Brain: There is mild cerebral atrophy with widening of the extra-axial spaces and ventricular dilatation. There are areas of decreased attenuation within the white matter tracts of the supratentorial brain, consistent with microvascular disease changes. Chronic bilateral basal gangliar infarcts are seen. A stable right sphenoid wing meningioma is noted (axial CT images 12 through 16, CT series 2). Vascular: No hyperdense vessel or unexpected calcification. Skull: Normal. Negative for fracture or focal lesion. Sinuses/Orbits: No acute finding. Other: None. IMPRESSION: 1. No acute intracranial abnormality. 2. Generalized cerebral atrophy and microvascular disease changes of the supratentorial brain. 3. Chronic bilateral basal gangliar infarcts. 4. Stable right sphenoid wing meningioma. Electronically Signed   By: Aram Candela M.D.   On: 05/01/2022 20:53   Pelvis Portable  Result Date: 04/28/2022 CLINICAL DATA:  Right hip hemiarthroplasty. EXAM: PORTABLE PELVIS 1-2 VIEWS COMPARISON:  Preoperative radiograph FINDINGS: Right hip hemi arthroplasty in expected alignment. No periprosthetic lucency or  fracture. Recent postsurgical change includes air and edema in the soft tissues. Lower lumbar surgical hardware is partially included. IMPRESSION: Right hip hemi arthroplasty without immediate postoperative complication. Electronically Signed   By: Narda Rutherford M.D.   On:  04/28/2022 13:14   DG Hip Unilat With Pelvis 2-3 Views Right  Result Date: 04/27/2022 CLINICAL DATA:  Recent fall with right leg pain, initial encounter EXAM: DG HIP (WITH OR WITHOUT PELVIS) 2-3V RIGHT COMPARISON:  None Available. FINDINGS: Pelvic ring is intact. There is a mid right femoral neck fracture with impaction and angulation at the fracture site. Femoral head is well seated. Postsurgical changes are noted in the lower lumbar spine. IMPRESSION: Right femoral neck fracture with impaction and angulation at the fracture site. Electronically Signed   By: Alcide Clever M.D.   On: 04/27/2022 23:06   CT Chest W Contrast  Result Date: 04/20/2022 CLINICAL DATA:  77 year old male with history of non-small cell lung cancer. Follow-up study. EXAM: CT CHEST WITH CONTRAST TECHNIQUE: Multidetector CT imaging of the chest was performed during intravenous contrast administration. RADIATION DOSE REDUCTION: This exam was performed according to the departmental dose-optimization program which includes automated exposure control, adjustment of the mA and/or kV according to patient size and/or use of iterative reconstruction technique. CONTRAST:  64mL OMNIPAQUE IOHEXOL 300 MG/ML  SOLN COMPARISON:  Chest CT 10/08/2021. FINDINGS: Cardiovascular: Heart size is normal. There is no significant pericardial fluid, thickening or pericardial calcification. There is aortic atherosclerosis, as well as atherosclerosis of the great vessels of the mediastinum and the coronary arteries, including calcified atherosclerotic plaque in the left main, left anterior descending, left circumflex and right coronary arteries. Mediastinum/Nodes: No pathologically enlarged  mediastinal or hilar lymph nodes. Esophagus is unremarkable in appearance. No axillary lymphadenopathy. Lungs/Pleura: Status post right upper lobectomy with compensatory hyperexpansion of the right middle and lower lobes. Small volume of pleural fluid and small amount of gas in the apex of the right hemithorax, favored to be chronic, but no prior studies are available for comparison postoperatively. No left pleural effusion. New pleural-based nodule measuring 9 x 6 mm in the posterior aspect of the right lower lobe (axial image 112 of series 7). New 5 mm left lower lobe pulmonary nodule (axial image 99 of series 7). No acute consolidative airspace disease. 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 Low-attenuation lesions in both kidneys compatible with simple cysts, largest of which is exophytic measuring 1.7 cm in the upper pole of the left kidney (no imaging follow-up recommended). Musculoskeletal: There are no aggressive appearing lytic or blastic lesions noted in the visualized portions of the skeleton. IMPRESSION: 1. Status post right upper lobectomy with new gas and fluid collection in the apex of the right hemithorax, potentially chronic. This is unlikely of clinical significance, but attention on follow-up studies is recommended to ensure resolution of the gas component of this collection. 2. Small pulmonary nodules in the lungs bilaterally, new compared to the prior study. These are nonspecific and could be infectious or inflammatory in etiology, however, close attention on follow-up studies is recommended to ensure regression. 3. Aortic atherosclerosis, in addition to left main and three-vessel coronary artery disease. There is also aneurysmal dilatation of the abdominal aorta which measures up to 4.7 x 4.1 cm in the suprarenal region. Recommend follow-up CT/MR every 6  months and vascular consultation. This recommendation follows ACR consensus guidelines: White Paper of the ACR Incidental Findings Committee II on Vascular Findings. J Am Coll Radiol 2013; 10:789-794. Aortic Atherosclerosis (ICD10-I70.0). Aortic aneurysm NOS (ICD10-I71.9). Electronically Signed   By: Trudie Reed M.D.   On: 04/20/2022  14:01     Time coordinating discharge: Over 30 minutes    Lewie Chamber, MD  [facility] 05/02/2022, 12:38 PM

## 2022-05-02 NOTE — Hospital Course (Signed)
Acute metabolic encephalopathy due to delirium - expected delirium in setting of advanced age, hospitalization, and hx CVAs with known cerebral atrophy and microvascular changes -Continue delirium precautions as necessary at SNF -CT head and MRI brain performed on admission which showed no acute abnormalities.  Underlying chronic changes noted   Right hip fracture Orthopedics was consulted and patient underwent  right hip hemiarthroplasty on 04/28/2022.   Orthopedic recommended weightbearing as tolerated on the right lower extremity and follow-up with Dr. [name] in 2 weeks.  Patient has been determined for skilled nursing facility placement.   Acute blood loss anemia Likely postoperative anemia.    No obvious hematoma noted.   -Right hip compartments soft and no concern for bleeding -Hemoglobin has remained stable, 8.9 g/dL on admission - expected slow improvement as patient post op from ortho surgery   Chronic combined systolic and diastolic CHF (congestive heart failure) On Lasix at 20 mg daily as needed at home.  Will resume on discharge.   History of stroke Has been started on Eliquis since 04/29/2022.    PAF (paroxysmal atrial fibrillation) Rate controlled.  On metoprolol, Eliquis on discharge.   Essential hypertension Controlled.  Patient is on metoprolol from home.  Blood pressure is stable.   AKI (acute kidney injury) Resolved.  Will be restarted on Lasix on discharge.   primary adenocarcinoma of upper lobe of right lung -has underwent 3 cycles of pembrolizumab. Last cycle on 04/22/22. Follows up with Dr. Ellin Saba with [facility], currently appears to be stable.   COPD (chronic obstructive pulmonary disease) - Stable, not in exacerbation, continue bronchodilators on discharge.

## 2022-05-02 NOTE — TOC Progression Note (Addendum)
Transition of Care [facility]) - Progression Note    Patient Details  Name: Darryl Ward MRN: [id number] Date of Birth: February 20, 1945  [facility]) CM/SW Contact  Beckie Busing, RN Phone Number:[phone]  05/02/2022, 12:09 PM  Clinical Narrative:    CM has received message that patient may be able to return to SNF today. CM has attempted to contact admissions coordinator [place] at [facility]. Message has been sent .  Per [place] the patient can return if he is stable . Kittly is inquiring about plan for patients low hemoglobin which is the reason why the facility sent him back yesterday. Message has been sent to MD.  Per admissions coordinator the facility will need to verify that insurance Berkley Harvey is good and new one is not needed before patient can return.        Expected Discharge Plan and Services                                               Social Determinants of Health (SDOH) Interventions SDOH Screenings   Food Insecurity: No Food Insecurity (11/29/2021)  Housing: Low Risk  (11/29/2021)  Transportation Needs: No Transportation Needs (11/29/2021)  Utilities: Not At Risk (11/29/2021)  Depression (PHQ2-9): Medium Risk (03/27/2022)  Financial Resource Strain: Low Risk  (11/29/2019)  Physical Activity: Inactive (11/29/2019)  Social Connections: Moderately Isolated (11/29/2019)  Stress: No Stress Concern Present (11/29/2019)  Tobacco Use: Medium Risk (05/01/2022)    Readmission Risk Interventions     No data to display

## 2022-05-03 ENCOUNTER — Inpatient Hospital Stay: Payer: HMO

## 2022-05-03 NOTE — Plan of Care (Signed)
Patient seen and rounded on. No changes to d/c summary from 05/02/22. Patient remains medically stable for discharge.   Darryl Chamber, MD [facility] 05/03/2022, 1:25 PM

## 2022-05-03 NOTE — TOC Progression Note (Addendum)
Transition of Care [facility]) - Progression Note    Patient Details  Name: Darryl Ward MRN: [id number] Date of Birth: April 23, 1945  Transition of Care [facility]) CM/SW Contact  Beckie Busing, RN Phone Number:[phone]  05/03/2022, 8:15 AM  Clinical Narrative:    CM has sent message to [facility] to determine if patient can return today. Will await a response and follow up.   [phone] Per [facility] patient will need a new insurance auth. CM to follow up.  0846 CM spoke with Tammy at [facility] to initiate new insurance auth. New auth pending.   1200 CM received call from [place] with [facility] stating that patient will need a new PT eval. MD made aware. Orders entetred.  1423 CM spoke with Tammy at [facility] to make her aware that PT evaluation has been completed and note is in. Tammy states that she will make nurse that is working on the case aware. Insurance Berkley Harvey remains pending.  1544 CM received call from [place] with [facility]. SNF request to medical director for review. Ambulance auth approved Approval # [id number]. Kitty with [facility] has been updated.    1557 CM has verified with Georgeann Oppenheim that patient can return to [facility] over the weekend if we get insurance auth.    Expected Discharge Plan and Services         Expected Discharge Date: 05/02/22                                     Social Determinants of Health (SDOH) Interventions SDOH Screenings   Food Insecurity: No Food Insecurity (11/29/2021)  Housing: Low Risk  (11/29/2021)  Transportation Needs: No Transportation Needs (11/29/2021)  Utilities: Not At Risk (11/29/2021)  Depression (PHQ2-9): Medium Risk (03/27/2022)  Financial Resource Strain: Low Risk  (11/29/2019)  Physical Activity: Inactive (11/29/2019)  Social Connections: Moderately Isolated (11/29/2019)  Stress: No Stress Concern Present (11/29/2019)  Tobacco Use: Medium Risk (05/01/2022)    Readmission Risk Interventions     No data to display

## 2022-05-03 NOTE — Evaluation (Signed)
Physical Therapy Evaluation Patient Details Name: Darryl Ward MRN: [id number] DOB: May 26, 1945 Today's Date: 05/03/2022  History of Present Illness  Darryl Ward is a 77 y.o. male with medical history significant for stage II right upper lobe adenocarcinoma on immunotherapy, chronic anxiety/depression, chronic normocytic anemia, history of AAA repair, CVA, combined systolic and diastolic CHF, paroxysmal A-fib on Eliquis, hypertension, hyperlipidemia, right hip hemiarthroplasy 04/28/22 after fall. DC to SNF  05/01/22 and sent back to  hospital 4/10 with confusion, AMS.  Clinical Impression  Pt admitted with above diagnosis.  Pt currently with functional limitations due to the deficits listed below (see PT Problem List). Pt will benefit from acute skilled PT to increase their independence and safety with mobility to allow discharge.     The patient is pleasant and participated in mobility, Able to take a few steps using RW and mod support.  Patient will benefit from PT while in acute care and after DC. Pt admitted with above diagnosis.  Pt currently with functional limitations due to the deficits listed below (see PT Problem List). Pt will benefit from acute skilled PT to increase their independence and safety with mobility to allow discharge.        Recommendations for follow up therapy are one component of a multi-disciplinary discharge planning process, led by the attending physician.  Recommendations may be updated based on patient status, additional functional criteria and insurance authorization.  Follow Up Recommendations Can patient physically be transported by private vehicle: No     Assistance Recommended at Discharge Frequent or constant Supervision/Assistance  Patient can return home with the following  A lot of help with walking and/or transfers;A lot of help with bathing/dressing/bathroom;Assistance with cooking/housework;Assist for transportation;Help with stairs or ramp for  entrance;Direct supervision/assist for financial management;Direct supervision/assist for medications management    Equipment Recommendations None recommended by PT  Recommendations for Other Services       Functional Status Assessment Patient has had a recent decline in their functional status and demonstrates the ability to make significant improvements in function in a reasonable and predictable amount of time.     Precautions / Restrictions Precautions Precautions: Fall;Posterior Hip Precaution Comments: per notes from [facility], posterior hip precautions Restrictions RLE Weight Bearing: Weight bearing as tolerated      Mobility  Bed Mobility   Bed Mobility: Supine to Sit, Sit to Supine     Supine to sit: Min guard Sit to supine: Min guard   General bed mobility comments: patient  able to move self to sitting and place legs back onto bed    Transfers Overall transfer level: Needs assistance Equipment used: Ambulation equipment used Transfers: Sit to/from Stand Sit to Stand: Mod assist   Step pivot transfers: Mod assist, +2 physical assistance       General transfer comment: multimodal cues  to  stand from bed at RW, decreased control of descent    Ambulation/Gait Ambulation/Gait assistance: Mod assist   Assistive device: Rolling walker (2 wheels) Gait Pattern/deviations: Step-to pattern, Antalgic Gait velocity: decr     General Gait Details: steps along the bed  x 5,  then again x 4  Stairs            Wheelchair Mobility    Modified Rankin (Stroke Patients Only)       Balance Overall balance assessment: Needs assistance Sitting-balance support: No upper extremity supported, Feet supported Sitting balance-Leahy Scale: Fair Sitting balance - Comments: sitting EOB   Standing balance support: Bilateral  upper extremity supported, Reliant on assistive device for balance, No upper extremity supported Standing balance-Leahy Scale: Poor Standing balance  comment: stood to use urinal, required steady support.                             Pertinent Vitals/Pain Pain Assessment Faces Pain Scale: Hurts little more Pain Location: RLE hip Pain Descriptors / Indicators: Discomfort, Aching, Guarding Pain Intervention(s): Monitored during session    Home Living Family/patient expects to be discharged to:: Skilled nursing facility                   Additional Comments: "[name]" roommate of 40 years,    Prior Function               Mobility Comments: PRN use of DME, fell when not using DME ADLs Comments: does not drive, roommate assists with IADLs     Hand Dominance        Extremity/Trunk Assessment   Upper Extremity Assessment Upper Extremity Assessment: Overall WFL for tasks assessed    Lower Extremity Assessment RLE Deficits / Details: able to flex hip and kne in supine, able to WB    Cervical / Trunk Assessment Cervical / Trunk Assessment: Kyphotic  Communication   Communication: HOH  Cognition Arousal/Alertness: Awake/alert Behavior During Therapy: WFL for tasks assessed/performed Overall Cognitive Status: Impaired/Different from baseline                   Orientation Level: Situation Current Attention Level: Selective Memory: Decreased short-term memory   Safety/Judgement: Decreased awareness of deficits, Decreased awareness of safety Awareness: Emergent Problem Solving: Slow processing, Difficulty sequencing, Requires verbal cues, Requires tactile cues General Comments: patient reports that he is  to keep his legs apart since surgery and does not know how since he has had his legs together all his life. States that he did not know what happened that he  was confused. recalls the fall he had, his dogs were  with him.        General Comments      Exercises General Exercises - Lower Extremity Heel Slides: AROM, Right, 10 reps, Supine   Assessment/Plan    PT Assessment Patient needs  continued PT services  PT Problem List Decreased strength;Decreased activity tolerance;Decreased balance;Decreased mobility;Decreased cognition;Decreased knowledge of use of DME;Decreased safety awareness;Decreased knowledge of precautions;Pain       PT Treatment Interventions DME instruction;Gait training;Functional mobility training;Therapeutic activities;Therapeutic exercise;Balance training;Cognitive remediation;Patient/family education    PT Goals (Current goals can be found in the Care Plan section)  Acute Rehab PT Goals Patient Stated Goal: go home to his dogs PT Goal Formulation: With patient Time For Goal Achievement: 05/17/22 Potential to Achieve Goals: Fair    Frequency Min 1X/week     Co-evaluation               AM-PAC PT "6 Clicks" Mobility  Outcome Measure Help needed turning from your back to your side while in a flat bed without using bedrails?: None Help needed moving from lying on your back to sitting on the side of a flat bed without using bedrails?: A Little Help needed moving to and from a bed to a chair (including a wheelchair)?: A Little Help needed standing up from a chair using your arms (e.g., wheelchair or bedside chair)?: A Lot Help needed to walk in hospital room?: Total Help needed climbing 3-5 steps with a railing? : Total  6 Click Score: 14    End of Session Equipment Utilized During Treatment: Gait belt Activity Tolerance: Patient tolerated treatment well Patient left: in bed;with call bell/phone within reach;with bed alarm set Nurse Communication: Mobility status PT Visit Diagnosis: Other abnormalities of gait and mobility (R26.89);Muscle weakness (generalized) (M62.81);Pain Pain - Right/Left: Right Pain - part of body: Hip    Time: 1326-1400 PT Time Calculation (min) (ACUTE ONLY): 34 min   Charges:   PT Evaluation $PT Eval Low Complexity: 1 Low PT Treatments $Gait Training: 8-22 mins        Blanchard Kelch PT Acute Rehabilitation  Services Office [phone] Weekend pager-[phone]   Rada Hay 05/03/2022, 2:17 PM

## 2022-05-04 NOTE — Plan of Care (Signed)
Patient seen and rounded on. No changes to d/c summary from 05/02/22. Patient remains medically stable for discharge.   Lewie Chamber, MD [facility] 05/04/2022, 9:41 AM

## 2022-05-04 NOTE — TOC Transition Note (Signed)
Transition of Care [facility]) - CM/SW Discharge Note   Patient Details  Name: Darryl Ward MRN: [id number] Date of Birth: [birth year]  Transition of Care [facility]) CM/SW Contact:  Amada Jupiter, LCSW Phone Number: 05/04/2022, 10:42 AM   Clinical Narrative:    Pt medically cleared for dc today to [facility].  Have received insurance authorization for SNF [phone] and for [facility] transport [phone].  Pt and s/o, [name], aware and agreeable.  [facility] called at 10:40am.  RN to call report to [phone].  No further TOC needs.   Final next level of care: Skilled Nursing Facility Barriers to Discharge: Barriers Resolved   Patient Goals and CMS Choice      Discharge Placement                Patient chooses bed at: [facility] Patient to be transferred to facility by: [facility] Name of family member notified: [name] Patient and family notified of of transfer: 05/04/22  Discharge Plan and Services Additional resources added to the After Visit Summary for                                       Social Determinants of Health (SDOH) Interventions SDOH Screenings   Food Insecurity: No Food Insecurity (05/03/2022)  Housing: Low Risk  (05/03/2022)  Transportation Needs: No Transportation Needs (05/03/2022)  Utilities: Not At Risk (05/03/2022)  Depression (PHQ2-9): Medium Risk (03/27/2022)  Financial Resource Strain: Low Risk  (11/29/2019)  Physical Activity: Inactive (11/29/2019)  Social Connections: Moderately Isolated (11/29/2019)  Stress: No Stress Concern Present (11/29/2019)  Tobacco Use: Medium Risk (05/01/2022)     Readmission Risk Interventions     No data to display

## 2022-05-04 NOTE — Plan of Care (Signed)

## 2022-05-04 NOTE — NC FL2 (Signed)
[place] MEDICAID FL2 LEVEL OF CARE FORM     IDENTIFICATION  Patient Name: Darryl Ward Birthdate: 1945-05-02 Sex: male Admission Date (Current Location): 05/01/2022  [facility] and [place] Number:  Producer, television/film/video and Address:  [facility],  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   Other intervertebral disc degeneration, lumbar region 05/01/2022   Hip fracture 04/28/2022   Normocytic anemia 04/28/2022   Iron deficiency anemia 04/22/2022   History of stroke 03/27/2022   Chronic combined systolic and diastolic CHF (congestive heart failure) 03/27/2022   S/P AAA repair 03/27/2022   PAF (paroxysmal atrial fibrillation) 12/23/2021   AKI (acute kidney injury) 09/30/2021   Essential hypertension 09/30/2021   Abnormal PET scan of colon 07/12/2021   Primary adenocarcinoma of upper lobe of right lung 06/26/2021   COPD (chronic obstructive pulmonary disease) 05/29/2021   Lumbar spinal stenosis 02/06/2020   Meningioma, cerebral 02/05/2020   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   Cerebral thrombosis with cerebral infarction 02/05/2020   Chronic midline low back pain without sciatica 04/27/2019   Tobacco abuse 11/17/2018   PVD (peripheral vascular disease) (HCC) 05/21/2012   Mixed hyperlipidemia 12/13/2011   Coronary atherosclerosis of native coronary artery 12/13/2011    Orientation RESPIRATION BLADDER Height & Weight     Self, Time, Situation, Place  Normal Incontinent Weight: 162 lb 14.7 oz (73.9 kg) Height:     BEHAVIORAL SYMPTOMS/MOOD  NEUROLOGICAL BOWEL NUTRITION STATUS      Continent Diet  AMBULATORY STATUS COMMUNICATION OF NEEDS Skin   Total Care Verbally Surgical wounds                       Personal Care Assistance Level of Assistance  Bathing, Feeding, Dressing Bathing Assistance: Maximum assistance Feeding assistance: Independent Dressing Assistance: Maximum assistance     Functional Limitations Info  Sight, Hearing, Speech Sight Info: Adequate Hearing Info: Adequate Speech Info: Adequate    SPECIAL CARE FACTORS FREQUENCY  PT (By licensed PT), OT (By licensed OT)     PT Frequency: 5x/wk OT Frequency: 5x/wk            Contractures Contractures Info: Not present    Additional Factors Info  Code Status, Allergies Code Status Info: Full Allergies Info: NKDA           Current Medications (05/04/2022):  This is the current hospital active medication list Current Facility-Administered Medications  Medication Dose Route Frequency Provider Last Rate Last Admin   acetaminophen (TYLENOL) tablet 650 mg  650 mg Oral Q6H PRN Dow Adolph N, DO   650 mg at 05/04/22 0752   albuterol (PROVENTIL) (2.5 MG/3ML) 0.083% nebulizer solution 2.5 mg  2.5 mg Nebulization Q6H PRN Darlin Drop, DO       apixaban (ELIQUIS) tablet 5 mg  5 mg Oral BID Dow Adolph N, DO   5 mg at 05/03/22 2029   atorvastatin (LIPITOR) tablet 80 mg  80 mg Oral Daily Dow Adolph N, DO   80 mg at 05/03/22 1018  escitalopram (LEXAPRO) tablet 10 mg  10 mg Oral Daily Dow Adolph N, DO   10 mg at 05/03/22 1018   folic acid (FOLVITE) tablet 1 mg  1 mg Oral Daily Dow Adolph N, DO   1 mg at 05/03/22 1018   magnesium oxide (MAG-OX) tablet 400 mg  400 mg Oral Daily Dow Adolph N, DO   400 mg at 05/03/22 1018   pantoprazole (PROTONIX) EC tablet 40 mg  40 mg Oral Daily Dow Adolph N, DO   40 mg at 05/03/22 1018   polyethylene glycol (MIRALAX / GLYCOLAX) packet 17 g  17 g Oral Daily PRN Dow Adolph N, DO       prochlorperazine  (COMPAZINE) injection 5 mg  5 mg Intravenous Q6H PRN Hall, Carole N, DO       umeclidinium bromide (INCRUSE ELLIPTA) 62.5 MCG/ACT 1 puff  1 puff Inhalation Daily Dow Adolph N, DO   1 puff at 05/04/22 6568     Discharge Medications: Please see discharge summary for a list of discharge medications.  Relevant Imaging Results:  Relevant Lab Results:   Additional Information SSN: [id number]  Amada Jupiter, LCSW

## 2022-05-04 NOTE — Progress Notes (Signed)
Patient discharged to [facility] via [facility], report called to [facility].

## 2022-05-04 NOTE — Discharge Summary (Signed)
Physician Discharge Summary   Darryl Ward:[id number] DOB: 16-Jul-1945 DOA: 05/01/2022  PCP: [name], DO  Admit date: 05/01/2022 Discharge date: 05/04/2022  [facility]: [facility] Disposition:  [facility] Discharging physician: Lewie Chamber, MD Barriers to discharge: none  Recommendations at discharge: Continue delirium precautions Hgb stable; can recheck Hgb in 3-4 days if needed Follow up with ortho, Dr. [name]  Discharge Condition: stable CODE STATUS: Full Diet recommendation:  Diet Orders (From admission, onward)     Start     Ordered   05/02/22 1113  Diet regular Room service appropriate? Yes; Fluid consistency: Thin  Diet effective now       Question Answer Comment  Room service appropriate? Yes   Fluid consistency: Thin      05/02/22 1112   05/02/22 0000  Diet general        05/02/22 1233            Hospital Course: Acute metabolic encephalopathy due to delirium - expected delirium in setting of advanced age, hospitalization, and hx CVAs with known cerebral atrophy and microvascular changes -Continue delirium precautions as necessary at SNF -CT head and MRI brain performed on admission which showed no acute abnormalities.  Underlying chronic changes noted   Right hip fracture Orthopedics was consulted and patient underwent  right hip hemiarthroplasty on 04/28/2022.   Orthopedic recommended weightbearing as tolerated on the right lower extremity and follow-up with Dr. [name] in 2 weeks.  Patient has been determined for skilled nursing facility placement.   Acute blood loss anemia Likely postoperative anemia.    No obvious hematoma noted.   -Right hip compartments soft and no concern for bleeding -Hemoglobin has remained stable, 8.9 g/dL on admission - expected slow improvement as patient post op from ortho surgery   Chronic combined systolic and diastolic CHF (congestive heart failure) On Lasix at 20 mg daily as needed at home.  Will resume on  discharge.   History of stroke Has been started on Eliquis since 04/29/2022.    PAF (paroxysmal atrial fibrillation) Rate controlled.  On metoprolol, Eliquis on discharge.   Essential hypertension Controlled.  Patient is on metoprolol from home.  Blood pressure is stable.   AKI (acute kidney injury) Resolved.  Will be restarted on Lasix on discharge.   primary adenocarcinoma of upper lobe of right lung -has underwent 3 cycles of pembrolizumab. Last cycle on 04/22/22. Follows up with Dr. Ellin Saba with [facility] oncology, currently appears to be stable.   COPD (chronic obstructive pulmonary disease) - Stable, not in exacerbation, continue bronchodilators on discharge.   Principal Diagnosis: AMS (altered mental status)  Discharge Diagnoses: Active Hospital Problems   Diagnosis Date Noted   Chronic combined systolic and diastolic CHF (congestive heart failure) 03/27/2022   History of stroke 03/27/2022   PAF (paroxysmal atrial fibrillation) 12/23/2021   Essential hypertension 09/30/2021   Mixed hyperlipidemia 12/13/2011   Coronary atherosclerosis of native coronary artery 12/13/2011    Resolved Hospital Problems   Diagnosis Date Noted Date Resolved   AMS (altered mental status) 05/01/2022 05/02/2022    Priority: 1.     Discharge Instructions     Diet general   Complete by: As directed    Increase activity slowly   Complete by: As directed       Allergies as of 05/04/2022   No Known Allergies      Medication List     STOP taking these medications    lidocaine-prilocaine cream Commonly known as: EMLA  TAKE these medications    acetaminophen 500 MG tablet Commonly known as: TYLENOL Take 2 tablets (1,000 mg total) by mouth every 6 (six) hours as needed.   albuterol 108 (90 Base) MCG/ACT inhaler Commonly known as: VENTOLIN HFA Inhale 2 puffs into the lungs every 6 (six) hours as needed for wheezing or shortness of breath.   atorvastatin 80 MG  tablet Commonly known as: LIPITOR Take 80 mg by mouth daily.   cyclobenzaprine 10 MG tablet Commonly known as: FLEXERIL Take 1 tablet (10 mg total) by mouth 2 (two) times daily as needed.   Eliquis 5 MG Tabs tablet Generic drug: apixaban Take 1 tablet (5 mg total) by mouth 2 (two) times daily.   escitalopram 10 MG tablet Commonly known as: LEXAPRO Take 1 tablet (10 mg total) by mouth daily.   folic acid 1 MG tablet Commonly known as: FOLVITE Take 1 tablet (1 mg total) by mouth daily.   furosemide 20 MG tablet Commonly known as: LASIX Take 1 tablet (20 mg total) by mouth daily as needed. What changed: when to take this   HYDROcodone-acetaminophen 5-325 MG tablet Commonly known as: NORCO/VICODIN Take 1-2 tablets by mouth every 6 (six) hours as needed for severe pain.   Magnesium Oxide 400 MG Caps Take 1 capsule (400 mg total) by mouth daily.   metoprolol succinate 50 MG 24 hr tablet Commonly known as: TOPROL-XL Take 1.5 tablets (75 mg total) by mouth daily. Take with or immediately following a meal.   pantoprazole 40 MG tablet Commonly known as: PROTONIX Take 1 tablet (40 mg total) by mouth daily for 14 days.   polyethylene glycol 17 g packet Commonly known as: MIRALAX / GLYCOLAX Take 17 g by mouth daily as needed for mild constipation or moderate constipation.   prochlorperazine 10 MG tablet Commonly known as: COMPAZINE Take 1 tablet (10 mg total) by mouth every 6 (six) hours as needed for nausea/vomiting   Spiriva Respimat 2.5 MCG/ACT Aers Generic drug: Tiotropium Bromide Monohydrate Inhale 2 puffs into the lungs daily.        No Known Allergies  Consultations:   Procedures:   Discharge Exam: BP (!) 118/59 (BP Location: Right Arm)   Pulse 83   Temp 98.4 F (36.9 C) (Oral)   Resp 18   Wt 73.9 kg   SpO2 99%   BMI 23.38 kg/m  Physical Exam Constitutional:      General: He is not in acute distress.    Comments: Pleasant elderly man sitting up  in bed in no distress.  Oriented to name, president, year  HENT:     Head: Normocephalic and atraumatic.     Mouth/Throat:     Mouth: Mucous membranes are moist.  Eyes:     Extraocular Movements: Extraocular movements intact.  Cardiovascular:     Rate and Rhythm: Normal rate and regular rhythm.     Heart sounds: Normal heart sounds.  Pulmonary:     Effort: Pulmonary effort is normal. No respiratory distress.     Breath sounds: Normal breath sounds. No wheezing.  Abdominal:     General: Bowel sounds are normal. There is no distension.     Palpations: Abdomen is soft.     Tenderness: There is no abdominal tenderness.  Musculoskeletal:     Cervical back: Normal range of motion and neck supple.     Comments: Right hip compartments soft with expected tenderness to palpation.  No bruising  Skin:    General: Skin is  warm and dry.  Neurological:     General: No focal deficit present.  Psychiatric:        Mood and Affect: Mood normal.        Behavior: Behavior normal.      The results of significant diagnostics from this hospitalization (including imaging, microbiology, ancillary and laboratory) are listed below for reference.   Microbiology: Recent Results (from the past 240 hour(s))  MRSA Next Gen by PCR, Nasal     Status: None   Collection Time: 04/28/22  5:42 AM   Specimen: Nasal Mucosa; Nasal Swab  Result Value Ref Range Status   MRSA by PCR Next Gen NOT DETECTED NOT DETECTED Final    Comment: (NOTE) The GeneXpert MRSA Assay (FDA approved for NASAL specimens only), is one component of a comprehensive MRSA colonization surveillance program. It is not intended to diagnose MRSA infection nor to guide or monitor treatment for MRSA infections. Test performance is not FDA approved in patients less than 65 years old. Performed at [facility], [address] [place], [place] [address]   Resp panel by RT-PCR (RSV, Flu A&B, Covid) Anterior Nasal Swab     Status: None    Collection Time: 05/01/22  8:13 PM   Specimen: Anterior Nasal Swab  Result Value Ref Range Status   SARS Coronavirus 2 by RT PCR NEGATIVE NEGATIVE Final    Comment: (NOTE) SARS-CoV-2 target nucleic acids are NOT DETECTED.  The SARS-CoV-2 RNA is generally detectable in upper respiratory specimens during the acute phase of infection. The lowest concentration of SARS-CoV-2 viral copies this assay can detect is 138 copies/mL. A negative result does not preclude SARS-Cov-2 infection and should not be used as the sole basis for treatment or other patient management decisions. A negative result may occur with  improper specimen collection/handling, submission of specimen other than nasopharyngeal swab, presence of viral mutation(s) within the areas targeted by this assay, and inadequate number of viral copies(<138 copies/mL). A negative result must be combined with clinical observations, patient history, and epidemiological information. The expected result is Negative.  Fact Sheet for Patients:  BloggerCourse.com  Fact Sheet for Healthcare Providers:  SeriousBroker.it  This test is no t yet approved or cleared by the [place] FDA and  has been authorized for detection and/or diagnosis of SARS-CoV-2 by FDA under an Emergency Use Authorization (EUA). This EUA will remain  in effect (meaning this test can be used) for the duration of the COVID-19 declaration under Section 564(b)(1) of the Act, 21 U.S.C.section 360bbb-3(b)(1), unless the authorization is terminated  or revoked sooner.       Influenza A by PCR NEGATIVE NEGATIVE Final   Influenza B by PCR NEGATIVE NEGATIVE Final    Comment: (NOTE) The Xpert Xpress SARS-CoV-2/FLU/RSV plus assay is intended as an aid in the diagnosis of influenza from Nasopharyngeal swab specimens and should not be used as a sole basis for treatment. Nasal washings and aspirates are unacceptable for  Xpert Xpress SARS-CoV-2/FLU/RSV testing.  Fact Sheet for Patients: BloggerCourse.com  Fact Sheet for Healthcare Providers: SeriousBroker.it  This test is not yet approved or cleared by the [place] FDA and has been authorized for detection and/or diagnosis of SARS-CoV-2 by FDA under an Emergency Use Authorization (EUA). This EUA will remain in effect (meaning this test can be used) for the duration of the COVID-19 declaration under Section 564(b)(1) of the Act, 21 U.S.C. section 360bbb-3(b)(1), unless the authorization is terminated or revoked.  Resp Syncytial Virus by PCR NEGATIVE NEGATIVE Final    Comment: (NOTE) Fact Sheet for Patients: BloggerCourse.com  Fact Sheet for Healthcare Providers: SeriousBroker.it  This test is not yet approved or cleared by the [place] FDA and has been authorized for detection and/or diagnosis of SARS-CoV-2 by FDA under an Emergency Use Authorization (EUA). This EUA will remain in effect (meaning this test can be used) for the duration of the COVID-19 declaration under Section 564(b)(1) of the Act, 21 U.S.C. section 360bbb-3(b)(1), unless the authorization is terminated or revoked.  Performed at [facility], [address], [place], [place] [address]      Labs: BNP (last 3 results) Recent Labs    12/04/21 2052  BNP 62.8    Basic Metabolic Panel: Recent Labs  Lab 04/27/22 2129 04/30/22 0254 05/01/22 0658 05/01/22 1945  NA 141 137 135 136  K 4.3 4.1 3.7 4.0  CL 104 109 106 104  CO2 24 25 23 26   GLUCOSE 99 99 98 110*  BUN 14 17 15 19   CREATININE 1.31* 0.99 0.96 0.97  CALCIUM 9.3 8.4* 8.6* 8.8*  MG  --  1.9  --  1.8    Liver Function Tests: Recent Labs  Lab 05/01/22 1945  AST 29  ALT 11  ALKPHOS 54  BILITOT 0.6  PROT 6.1*  ALBUMIN 3.0*    No results for input(s): "LIPASE", "AMYLASE"  in the last 168 hours. No results for input(s): "AMMONIA" in the last 168 hours. CBC: Recent Labs  Lab 04/27/22 2129 04/30/22 0254 04/30/22 1405 04/30/22 1728 05/01/22 0658 05/01/22 1945  WBC 8.0 9.0  --   --   --  9.0  NEUTROABS 5.4  --   --   --   --   --   HGB 11.4* 8.4* 8.7* 9.2* 8.8* 8.9*  HCT 36.0* 26.0* 26.0* 27.5* 27.1* 27.6*  MCV 96.0 94.9  --   --   --  95.5  PLT 131* 104*  --   --   --  139*    Cardiac Enzymes: No results for input(s): "CKTOTAL", "CKMB", "CKMBINDEX", "TROPONINI" in the last 168 hours. BNP: Invalid input(s): "POCBNP" CBG: Recent Labs  Lab 04/27/22 2136 05/01/22 2031  GLUCAP 94 101*    D-Dimer No results for input(s): "DDIMER" in the last 72 hours. Hgb A1c No results for input(s): "HGBA1C" in the last 72 hours. Lipid Profile No results for input(s): "CHOL", "HDL", "LDLCALC", "TRIG", "CHOLHDL", "LDLDIRECT" in the last 72 hours. Thyroid function studies Recent Labs    05/01/22 2110  TSH 1.077    Anemia work up No results for input(s): "VITAMINB12", "FOLATE", "FERRITIN", "TIBC", "IRON", "RETICCTPCT" in the last 72 hours. Urinalysis    Component Value Date/Time   COLORURINE YELLOW 05/01/2022 2125   APPEARANCEUR CLEAR 05/01/2022 2125   LABSPEC 1.015 05/01/2022 2125   PHURINE 6.0 05/01/2022 2125   GLUCOSEU NEGATIVE 05/01/2022 2125   HGBUR SMALL (A) 05/01/2022 2125   BILIRUBINUR NEGATIVE 05/01/2022 2125   KETONESUR NEGATIVE 05/01/2022 2125   PROTEINUR NEGATIVE 05/01/2022 2125   UROBILINOGEN 1.0 01/10/2012 1728   NITRITE NEGATIVE 05/01/2022 2125   LEUKOCYTESUR NEGATIVE 05/01/2022 2125   Sepsis Labs Recent Labs  Lab 04/27/22 2129 04/30/22 0254 05/01/22 1945  WBC 8.0 9.0 9.0    Microbiology Recent Results (from the past 240 hour(s))  MRSA Next Gen by PCR, Nasal     Status: None   Collection Time: 04/28/22  5:42 AM   Specimen: Nasal Mucosa; Nasal  Swab  Result Value Ref Range Status   MRSA by PCR Next Gen NOT DETECTED NOT  DETECTED Final    Comment: (NOTE) The GeneXpert MRSA Assay (FDA approved for NASAL specimens only), is one component of a comprehensive MRSA colonization surveillance program. It is not intended to diagnose MRSA infection nor to guide or monitor treatment for MRSA infections. Test performance is not FDA approved in patients less than 58 years old. Performed at [facility], [address], [place], [place] [address]   Resp panel by RT-PCR (RSV, Flu A&B, Covid) Anterior Nasal Swab     Status: None   Collection Time: 05/01/22  8:13 PM   Specimen: Anterior Nasal Swab  Result Value Ref Range Status   SARS Coronavirus 2 by RT PCR NEGATIVE NEGATIVE Final    Comment: (NOTE) SARS-CoV-2 target nucleic acids are NOT DETECTED.  The SARS-CoV-2 RNA is generally detectable in upper respiratory specimens during the acute phase of infection. The lowest concentration of SARS-CoV-2 viral copies this assay can detect is 138 copies/mL. A negative result does not preclude SARS-Cov-2 infection and should not be used as the sole basis for treatment or other patient management decisions. A negative result may occur with  improper specimen collection/handling, submission of specimen other than nasopharyngeal swab, presence of viral mutation(s) within the areas targeted by this assay, and inadequate number of viral copies(<138 copies/mL). A negative result must be combined with clinical observations, patient history, and epidemiological information. The expected result is Negative.  Fact Sheet for Patients:  BloggerCourse.com  Fact Sheet for Healthcare Providers:  SeriousBroker.it  This test is no t yet approved or cleared by the [place] FDA and  has been authorized for detection and/or diagnosis of SARS-CoV-2 by FDA under an Emergency Use Authorization (EUA). This EUA will remain  in effect (meaning this test can be used) for the duration of  the COVID-19 declaration under Section 564(b)(1) of the Act, 21 U.S.C.section 360bbb-3(b)(1), unless the authorization is terminated  or revoked sooner.       Influenza A by PCR NEGATIVE NEGATIVE Final   Influenza B by PCR NEGATIVE NEGATIVE Final    Comment: (NOTE) The Xpert Xpress SARS-CoV-2/FLU/RSV plus assay is intended as an aid in the diagnosis of influenza from Nasopharyngeal swab specimens and should not be used as a sole basis for treatment. Nasal washings and aspirates are unacceptable for Xpert Xpress SARS-CoV-2/FLU/RSV testing.  Fact Sheet for Patients: BloggerCourse.com  Fact Sheet for Healthcare Providers: SeriousBroker.it  This test is not yet approved or cleared by the [place] FDA and has been authorized for detection and/or diagnosis of SARS-CoV-2 by FDA under an Emergency Use Authorization (EUA). This EUA will remain in effect (meaning this test can be used) for the duration of the COVID-19 declaration under Section 564(b)(1) of the Act, 21 U.S.C. section 360bbb-3(b)(1), unless the authorization is terminated or revoked.     Resp Syncytial Virus by PCR NEGATIVE NEGATIVE Final    Comment: (NOTE) Fact Sheet for Patients: BloggerCourse.com  Fact Sheet for Healthcare Providers: SeriousBroker.it  This test is not yet approved or cleared by the [place] FDA and has been authorized for detection and/or diagnosis of SARS-CoV-2 by FDA under an Emergency Use Authorization (EUA). This EUA will remain in effect (meaning this test can be used) for the duration of the COVID-19 declaration under Section 564(b)(1) of the Act, 21 U.S.C. section 360bbb-3(b)(1), unless the authorization is terminated or revoked.  Performed at [facility], [address] [place], [place] [address]     Procedures/Studies: MR BRAIN WO CONTRAST  Result  Date: 05/02/2022 CLINICAL DATA:  Provided history: Delirium. Additional history provided: Recent fall. Right hip fracture. Cancer history. EXAM: MRI HEAD WITHOUT CONTRAST TECHNIQUE: Multiplanar, multiecho pulse sequences of the brain and surrounding structures were obtained without intravenous contrast. COMPARISON:  Head CT 05/01/2022. Brain MRI 07/11/2021. FINDINGS: Intermittently motion degraded examination, limiting evaluation. Most notably, the axial T2 sequence is moderate to severely motion degraded, the axial T2 FLAIR sequence is moderately motion degraded, the axial SWI sequence is severely motion degraded and the axial T1-weighted sequence is severely motion degraded. Brain: Moderate generalized cerebral atrophy. Mild cerebellar atrophy. A right sphenoid wing/paraclinoid meningioma is poorly reassessed due to the degree of motion degradation, but has not appreciably changed in size from the prior brain MRI of 07/11/2021. Redemonstrated chronic lacunar infarcts within bilateral cerebral hemispheric white matter and deep gray nuclei. Background moderate multifocal T2 FLAIR hyperintense signal abnormality within the cerebral white matter, nonspecific but compatible with chronic small vessel disease. Severe motion degradation of the axial SWI sequence limits evaluation for intracranial blood products. There is no acute infarct. No extra-axial fluid collection. No midline shift. Vascular: Maintained flow voids within the proximal large arterial vessels. Skull and upper cervical spine: No suspicious marrow lesion. Sinuses/Orbits: No mass or acute finding within the imaged orbits. Prior bilateral ocular lens replacement. No significant paranasal sinus disease. Other: Bilateral mastoid effusions. IMPRESSION: 1. Motion degraded examination. 2.  No evidence of an acute intracranial abnormality. 3. Parenchymal atrophy, chronic small vessel disease and chronic lacunar infarcts as detailed. 4. A known right sphenoid  wing/paraclinoid meningioma is poorly reassessed due to the degree of motion degradation, but has not appreciably changed in size from the prior brain MRI of 07/11/2021. 5. Bilateral mastoid effusions. Electronically Signed   By: Jackey Loge D.O.   On: 05/02/2022 12:26   CT Head Wo Contrast  Result Date: 05/01/2022 CLINICAL DATA:  Altered mental status and recent fall. EXAM: CT HEAD WITHOUT CONTRAST TECHNIQUE: Contiguous axial images were obtained from the base of the skull through the vertex without intravenous contrast. RADIATION DOSE REDUCTION: This exam was performed according to the departmental dose-optimization program which includes automated exposure control, adjustment of the mA and/or kV according to patient size and/or use of iterative reconstruction technique. COMPARISON:  January 25, 2020 FINDINGS: Brain: There is mild cerebral atrophy with widening of the extra-axial spaces and ventricular dilatation. There are areas of decreased attenuation within the white matter tracts of the supratentorial brain, consistent with microvascular disease changes. Chronic bilateral basal gangliar infarcts are seen. A stable right sphenoid wing meningioma is noted (axial CT images 12 through 16, CT series 2). Vascular: No hyperdense vessel or unexpected calcification. Skull: Normal. Negative for fracture or focal lesion. Sinuses/Orbits: No acute finding. Other: None. IMPRESSION: 1. No acute intracranial abnormality. 2. Generalized cerebral atrophy and microvascular disease changes of the supratentorial brain. 3. Chronic bilateral basal gangliar infarcts. 4. Stable right sphenoid wing meningioma. Electronically Signed   By: Aram Candela M.D.   On: 05/01/2022 20:53   Pelvis Portable  Result Date: 04/28/2022 CLINICAL DATA:  Right hip hemiarthroplasty. EXAM: PORTABLE PELVIS 1-2 VIEWS COMPARISON:  Preoperative radiograph FINDINGS: Right hip hemi arthroplasty in expected alignment. No periprosthetic lucency or  fracture. Recent postsurgical change includes air and edema in the soft tissues. Lower lumbar surgical hardware is partially included. IMPRESSION: Right hip hemi arthroplasty without immediate postoperative complication.  Electronically Signed   By: Narda Rutherford M.D.   On: 04/28/2022 13:14   DG Hip Unilat With Pelvis 2-3 Views Right  Result Date: 04/27/2022 CLINICAL DATA:  Recent fall with right leg pain, initial encounter EXAM: DG HIP (WITH OR WITHOUT PELVIS) 2-3V RIGHT COMPARISON:  None Available. FINDINGS: Pelvic ring is intact. There is a mid right femoral neck fracture with impaction and angulation at the fracture site. Femoral head is well seated. Postsurgical changes are noted in the lower lumbar spine. IMPRESSION: Right femoral neck fracture with impaction and angulation at the fracture site. Electronically Signed   By: Alcide Clever M.D.   On: 04/27/2022 23:06   CT Chest W Contrast  Result Date: 04/20/2022 CLINICAL DATA:  77 year old male with history of non-small cell lung cancer. Follow-up study. EXAM: CT CHEST WITH CONTRAST TECHNIQUE: Multidetector CT imaging of the chest was performed during intravenous contrast administration. RADIATION DOSE REDUCTION: This exam was performed according to the departmental dose-optimization program which includes automated exposure control, adjustment of the mA and/or kV according to patient size and/or use of iterative reconstruction technique. CONTRAST:  75mL OMNIPAQUE IOHEXOL 300 MG/ML  SOLN COMPARISON:  Chest CT 10/08/2021. FINDINGS: Cardiovascular: Heart size is normal. There is no significant pericardial fluid, thickening or pericardial calcification. There is aortic atherosclerosis, as well as atherosclerosis of the great vessels of the mediastinum and the coronary arteries, including calcified atherosclerotic plaque in the left main, left anterior descending, left circumflex and right coronary arteries. Mediastinum/Nodes: No pathologically enlarged  mediastinal or hilar lymph nodes. Esophagus is unremarkable in appearance. No axillary lymphadenopathy. Lungs/Pleura: Status post right upper lobectomy with compensatory hyperexpansion of the right middle and lower lobes. Small volume of pleural fluid and small amount of gas in the apex of the right hemithorax, favored to be chronic, but no prior studies are available for comparison postoperatively. No left pleural effusion. New pleural-based nodule measuring 9 x 6 mm in the posterior aspect of the right lower lobe (axial image 112 of series 7). New 5 mm left lower lobe pulmonary nodule (axial image 99 of series 7). No acute consolidative airspace disease. 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 Low-attenuation lesions in both kidneys compatible with simple cysts, largest of which is exophytic measuring 1.7 cm in the upper pole of the left kidney (no imaging follow-up recommended). Musculoskeletal: There are no aggressive appearing lytic or blastic lesions noted in the visualized portions of the skeleton. IMPRESSION: 1. Status post right upper lobectomy with new gas and fluid collection in the apex of the right hemithorax, potentially chronic. This is unlikely of clinical significance, but attention on follow-up studies is recommended to ensure resolution of the gas component of this collection. 2. Small pulmonary nodules in the lungs bilaterally, new compared to the prior study. These are nonspecific and could be infectious or inflammatory in etiology, however, close attention on follow-up studies is recommended to ensure regression. 3. Aortic atherosclerosis, in addition to left main and three-vessel coronary artery disease. There is also aneurysmal dilatation of the abdominal aorta which measures up to 4.7 x 4.1 cm in the suprarenal region. Recommend follow-up CT/MR every 6  months and vascular consultation. This recommendation follows ACR consensus guidelines: White Paper of the ACR Incidental Findings Committee II on Vascular Findings. J Am Coll Radiol 2013; 10:789-794. Aortic Atherosclerosis (ICD10-I70.0). Aortic aneurysm NOS (ICD10-I71.9). Electronically  Signed   By: Trudie Reed M.D.   On: 04/20/2022 14:01     Time coordinating discharge: Over 30 minutes    Lewie Chamber, MD  [facility] 05/04/2022, 9:53 AM

## 2022-05-06 ENCOUNTER — Other Ambulatory Visit (HOSPITAL_COMMUNITY): Payer: Self-pay

## 2022-05-06 ENCOUNTER — Other Ambulatory Visit: Payer: Self-pay

## 2022-05-06 MED FILL — Folic Acid Tab 1 MG: ORAL | 90 days supply | Qty: 90 | Fill #0 | Status: AC

## 2022-05-07 ENCOUNTER — Ambulatory Visit: Payer: PPO | Admitting: Thoracic Surgery (Cardiothoracic Vascular Surgery)

## 2022-05-09 ENCOUNTER — Ambulatory Visit: Payer: HMO | Admitting: Nurse Practitioner

## 2022-05-13 ENCOUNTER — Ambulatory Visit: Payer: HMO | Admitting: Hematology

## 2022-05-13 ENCOUNTER — Other Ambulatory Visit: Payer: HMO

## 2022-05-13 ENCOUNTER — Ambulatory Visit: Payer: HMO

## 2022-05-16 ENCOUNTER — Encounter: Payer: Self-pay | Admitting: *Deleted

## 2022-05-17 ENCOUNTER — Other Ambulatory Visit (HOSPITAL_COMMUNITY): Payer: Self-pay

## 2022-06-03 ENCOUNTER — Inpatient Hospital Stay: Payer: HMO | Admitting: Hematology

## 2022-06-03 ENCOUNTER — Inpatient Hospital Stay: Payer: HMO

## 2022-06-03 DIAGNOSIS — C3411 Malignant neoplasm of upper lobe, right bronchus or lung: Secondary | ICD-10-CM

## 2022-06-06 ENCOUNTER — Other Ambulatory Visit (HOSPITAL_COMMUNITY): Payer: Self-pay

## 2022-06-06 ENCOUNTER — Other Ambulatory Visit: Payer: Self-pay

## 2022-06-06 ENCOUNTER — Other Ambulatory Visit: Payer: Self-pay | Admitting: Emergency Medicine

## 2022-06-06 ENCOUNTER — Other Ambulatory Visit: Payer: Self-pay | Admitting: Family Medicine

## 2022-06-06 MED ORDER — CYCLOBENZAPRINE HCL 10 MG PO TABS
10.0000 mg | ORAL_TABLET | Freq: Two times a day (BID) | ORAL | 3 refills | Status: DC | PRN
  Filled 2022-06-06: qty 180, 90d supply, fill #0

## 2022-06-06 MED ORDER — SPIRIVA RESPIMAT 2.5 MCG/ACT IN AERS: 2.0000 | INHALATION_SPRAY | Freq: Every day | RESPIRATORY_TRACT | 0 refills | Status: DC

## 2022-06-06 MED FILL — Folic Acid Tab 1 MG: ORAL | 90 days supply | Qty: 90 | Fill #1 | Status: CN

## 2022-06-07 ENCOUNTER — Other Ambulatory Visit: Payer: Self-pay

## 2022-06-10 ENCOUNTER — Other Ambulatory Visit (HOSPITAL_COMMUNITY): Payer: Self-pay

## 2022-06-10 ENCOUNTER — Encounter: Payer: Self-pay | Admitting: Hematology

## 2022-06-10 ENCOUNTER — Other Ambulatory Visit: Payer: Self-pay

## 2022-06-10 MED ORDER — ELIQUIS 5 MG PO TABS
5.0000 mg | ORAL_TABLET | Freq: Two times a day (BID) | ORAL | 0 refills | Status: DC
  Filled 2022-06-10: qty 30, 15d supply, fill #0

## 2022-06-10 MED ORDER — HYDROCODONE-ACETAMINOPHEN 5-325 MG PO TABS
1.0000 | ORAL_TABLET | Freq: Four times a day (QID) | ORAL | 0 refills | Status: DC | PRN
  Filled 2022-06-10 (×2): qty 60, 15d supply, fill #0

## 2022-06-10 MED ORDER — HYDROCODONE-ACETAMINOPHEN 5-325 MG PO TABS
1.0000 | ORAL_TABLET | Freq: Four times a day (QID) | ORAL | 0 refills | Status: DC | PRN
  Filled 2022-06-10: qty 60, 15d supply, fill #0

## 2022-06-10 MED ORDER — METOPROLOL SUCCINATE ER 25 MG PO TB24
75.0000 mg | ORAL_TABLET | Freq: Every day | ORAL | 0 refills | Status: DC
  Filled 2022-06-10: qty 45, 15d supply, fill #0

## 2022-06-10 MED ORDER — CYCLOBENZAPRINE HCL 10 MG PO TABS: 10.0000 mg | ORAL_TABLET | Freq: Two times a day (BID) | ORAL | 0 refills | Status: DC | PRN

## 2022-06-10 MED ORDER — PROCHLORPERAZINE MALEATE 10 MG PO TABS
10.0000 mg | ORAL_TABLET | Freq: Four times a day (QID) | ORAL | 0 refills | Status: DC | PRN
  Filled 2022-06-10: qty 30, 8d supply, fill #0

## 2022-06-10 MED ORDER — ESCITALOPRAM OXALATE 10 MG PO TABS: 10.0000 mg | ORAL_TABLET | Freq: Every day | ORAL | 0 refills | Status: DC

## 2022-06-10 MED ORDER — ATORVASTATIN CALCIUM 80 MG PO TABS
80.0000 mg | ORAL_TABLET | Freq: Every day | ORAL | 0 refills | Status: DC
  Filled 2022-06-10: qty 15, 15d supply, fill #0

## 2022-06-10 MED ORDER — FOLIC ACID 1 MG PO TABS: 1.0000 mg | ORAL_TABLET | Freq: Every day | ORAL | 0 refills | Status: DC

## 2022-06-10 MED ORDER — MAGNESIUM OXIDE -MG SUPPLEMENT 400 (240 MG) MG PO TABS
400.0000 mg | ORAL_TABLET | Freq: Every day | ORAL | 0 refills | Status: DC
  Filled 2022-06-10 – 2022-06-11 (×2): qty 15, 15d supply, fill #0

## 2022-06-10 MED ORDER — ACETAMINOPHEN 500 MG PO TABS
ORAL_TABLET | ORAL | 0 refills | Status: DC
  Filled 2022-06-10: qty 60, 15d supply, fill #0
  Filled 2022-06-21: qty 60, fill #0

## 2022-06-10 MED ORDER — TRAMADOL HCL 50 MG PO TABS
50.0000 mg | ORAL_TABLET | Freq: Two times a day (BID) | ORAL | 0 refills | Status: DC | PRN
  Filled 2022-06-10: qty 20, 10d supply, fill #0

## 2022-06-10 MED ORDER — POTASSIUM CHLORIDE ER 10 MEQ PO TBCR
10.0000 meq | EXTENDED_RELEASE_TABLET | Freq: Every day | ORAL | 0 refills | Status: DC
  Filled 2022-06-10: qty 15, 15d supply, fill #0

## 2022-06-10 MED ORDER — PANTOPRAZOLE SODIUM 40 MG PO TBEC
40.0000 mg | DELAYED_RELEASE_TABLET | Freq: Every day | ORAL | 0 refills | Status: DC
  Filled 2022-06-10: qty 15, 15d supply, fill #0

## 2022-06-10 MED ORDER — ALBUTEROL SULFATE HFA 108 (90 BASE) MCG/ACT IN AERS
2.0000 | INHALATION_SPRAY | Freq: Four times a day (QID) | RESPIRATORY_TRACT | 0 refills | Status: DC | PRN
  Filled 2022-06-10: qty 6.7, 25d supply, fill #0

## 2022-06-10 MED ORDER — FUROSEMIDE 20 MG PO TABS
20.0000 mg | ORAL_TABLET | Freq: Every day | ORAL | 0 refills | Status: DC | PRN
  Filled 2022-06-10: qty 15, 15d supply, fill #0

## 2022-06-10 MED ORDER — MAGNESIUM 400 MG PO CAPS: 400.0000 mg | ORAL_CAPSULE | Freq: Every day | ORAL | 0 refills | Status: DC

## 2022-06-11 ENCOUNTER — Other Ambulatory Visit (HOSPITAL_COMMUNITY): Payer: Self-pay

## 2022-06-11 ENCOUNTER — Other Ambulatory Visit: Payer: Self-pay

## 2022-06-11 MED ORDER — SPIRIVA RESPIMAT 2.5 MCG/ACT IN AERS
2.0000 | INHALATION_SPRAY | Freq: Every day | RESPIRATORY_TRACT | 0 refills | Status: DC
  Filled 2022-06-11 (×2): qty 4, 30d supply, fill #0

## 2022-06-11 MED ORDER — PEG 3350-KCL-NA BICARB-NACL 420 G PO SOLR
ORAL | 0 refills | Status: DC
  Filled 2022-06-11: qty 4000, 30d supply, fill #0

## 2022-06-12 ENCOUNTER — Ambulatory Visit (INDEPENDENT_AMBULATORY_CARE_PROVIDER_SITE_OTHER): Payer: HMO | Admitting: Family Medicine

## 2022-06-12 ENCOUNTER — Other Ambulatory Visit: Payer: Self-pay

## 2022-06-12 VITALS — BP 102/61 | HR 61 | Temp 97.7°F | Ht 70.0 in | Wt 156.0 lb

## 2022-06-12 DIAGNOSIS — Z720 Tobacco use: Secondary | ICD-10-CM

## 2022-06-12 DIAGNOSIS — I10 Essential (primary) hypertension: Secondary | ICD-10-CM

## 2022-06-12 DIAGNOSIS — N401 Enlarged prostate with lower urinary tract symptoms: Secondary | ICD-10-CM

## 2022-06-12 DIAGNOSIS — D649 Anemia, unspecified: Secondary | ICD-10-CM | POA: Diagnosis not present

## 2022-06-12 DIAGNOSIS — I6930 Unspecified sequelae of cerebral infarction: Secondary | ICD-10-CM | POA: Diagnosis not present

## 2022-06-12 DIAGNOSIS — I6330 Cerebral infarction due to thrombosis of unspecified cerebral artery: Secondary | ICD-10-CM

## 2022-06-12 MED ORDER — TAMSULOSIN HCL 0.4 MG PO CAPS
0.4000 mg | ORAL_CAPSULE | Freq: Every day | ORAL | 3 refills | Status: DC
  Filled 2022-06-12: qty 90, 90d supply, fill #0
  Filled 2022-06-21 – 2022-09-05 (×2): qty 90, 90d supply, fill #1
  Filled 2022-12-05: qty 90, 90d supply, fill #2
  Filled 2023-03-05: qty 90, 90d supply, fill #3

## 2022-06-12 NOTE — Progress Notes (Signed)
[name] [address], [place] [address]    Clinic Day:  06/13/2022  Referring physician: [name], DO  Patient Care Team: [name], DO as PCP - General (Family Medicine) [name], MD as PCP - Cardiology (Cardiology) [name], MD (Cardiology) [name], MD as Consulting Physician (Gastroenterology) [name], RN as Oncology Nurse Navigator (Medical Oncology) [name], MD as Medical Oncologist (Medical Oncology) [name], MD as Consulting Physician (Hematology)   ASSESSMENT & PLAN:   Assessment: 1. 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  Occasional additional small pulmonary nodules measuring 0.4 cm and smaller. - Bronchoscopy (06/11/2021): RUL nodule brushing and FNA: Malignant cells with features of adenocarcinoma. - PET scan (06/22/2021): Subsolid nodular lesion in the right upper lobe, hypermetabolic SUV 15.  Small right hilar and infrahilar lymph nodes with SUV 4.03 concerning for metastatic adenopathy.  Small hypermetabolic left parotid gland lesion, consistent with previous history of Wharton's tumor.  Hypermetabolic focus in the transverse colon SUV 8.69. - MRI of the brain (07/11/2021): No evidence of metastatic disease.  Right sphenoid wing meningioma stable since 2022. - He was evaluated by Dr. Dorris Fetch and was recommended to undergo neoadjuvant chemoimmunotherapy followed by surgical resection. - 3 cycles of carboplatin, pemetrexed from 08/06/2021 through 10/02/2021 (opdivo given with only cycle 2, discontinued for cycle 3 due to cardiac problems) - NGS testing not performed due to insufficient sample. - Guardant360: Negative for EGFR and ALK.  MSI high not detected. - Right upper lobectomy and lymph node dissection (11/12/2021): 1.2 cm invasive adenocarcinoma,  moderately differentiated, margins negative, pT1b pN0, 0/17 lymph nodes positive from stations 4R, 7, 8, 9, 10R, 11 R and 12 R.  Negative for visceral pleural involvement, LVI.  PD-L1 TPS is 1%. - Adjuvant pembrolizumab started on 03/11/2022.   2. Social/family history: - Lives at home with his wife.  Uses cane occasionally after he had stroke in January 2022.  He has retired after working in [employer].  He is current active smoker, 1 pack/day for 60 years.  No exposure to chemicals. - Brother died of metastatic cancer.  Maternal uncle had lung cancer.    Plan: 1. 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 - He fell at home and broke his right hip and underwent hemiarthroplasty.  He was at the rehab center in [place] and came home on 06/08/2022.  PT will likely start next week.  He is taking hydrocodone as needed for hip pain. - He is walking very little with a walker but is mostly in wheelchair. - Reviewed labs today: Normal LFTs.  CBC shows hemoglobin is 9.7. - I will hold his treatment today.  I will reevaluate him in 3 weeks. - He is having difficulty urination.  We checked a UA in the office which was negative for infection.  He was given Flomax by Dr. Adriana Simas which she will start today.  2.  Normocytic anemia: - He received 1 dose of Feraheme on 04/26/2022.  Hemoglobin today is 9.7.  He will proceed with his second dose today.   3.  Hypertension/A-fib: - Blood pressure today 94/61.  Heart rate is 60. - He is taking metoprolol 75 mg daily.  Will decrease it to 50 mg daily.   4.  Depression: -  Continue Lexapro 10 mg daily.    Orders Placed This Encounter  Procedures   Iron and TIBC ([facility] ONLY)    Standing Status:   Future    Number of Occurrences:   1    Standing Expiration Date:   06/13/2023   Ferritin    Standing Status:   Future    Number of Occurrences:   1    Standing Expiration Date:   06/13/2023       I,[name],acting as a scribe for [name], MD.,have documented all relevant documentation on the behalf of [name], MD,as directed by  [name], MD while in the presence of [name], MD.   I, [name] MD, have reviewed the above documentation for accuracy and completeness, and I agree with the above.   [name], MD   5/23/20246:08 PM  CHIEF COMPLAINT:   Diagnosis: right upper lobe lung adenocarcinoma    Cancer Staging  Primary adenocarcinoma of upper lobe of right lung [facility]) Staging form: Lung, AJCC 8th Edition - Clinical stage from 06/26/2021: cT1, cN1, cM0 - Unsigned    Prior Therapy: 1. 3 cycles Neoadjuvant chemoimmunotherapy with carboplatin, pemetrexed from 08/06/21 through 10/02/21 2. RUL lobectomy and LND on 10/23/203  Current Therapy:  Adjuvant pembrolizumab    HISTORY OF PRESENT ILLNESS:   Oncology History  Primary adenocarcinoma of upper lobe of right lung (HCC)  06/26/2021 Initial Diagnosis   Primary adenocarcinoma of upper lobe of right lung (HCC)   08/06/2021 - 08/27/2021 Chemotherapy   Patient is on Treatment Plan : LUNG NSCLC Pemetrexed + Carboplatin q21d x 4 Cycles     08/06/2021 - 10/02/2021 Chemotherapy   Patient is on Treatment Plan : LUNG NSCLC Pemetrexed + Carboplatin q21d x 4 Cycles     03/11/2022 -  Chemotherapy   Patient is on Treatment Plan : LUNG NSCLC Pembrolizumab (200) q21d        INTERVAL HISTORY:   Darryl Ward is a 77 y.o. male presenting to clinic today for follow up of right upper lobe lung adenocarcinoma. He was last seen by me on 04/22/22.  Since his last visit, he experienced a fall on 04/27/22 and was taken to the ED via EMS. He was found to have a closed right hip fracture and was taken for hemiarthroplasty the following day under Dr. [name].  Today, he states that he is doing well overall. His appetite level is at 75%. His energy level is at 25%.  PAST MEDICAL  HISTORY:   Past Medical History: Past Medical History:  Diagnosis Date   AAA (abdominal aortic aneurysm) (HCC)    Arthritis    Cancer (HCC)    Carotid artery disease (HCC)    Nonobstructive   Cataract    COPD (chronic obstructive pulmonary disease) (HCC)    Coronary atherosclerosis of native coronary artery    PTCA small diagonal 2007 otherwise nonobstructive CAD   Depression    Dysrhythmia    Essential hypertension, benign    Hyperlipidemia    NSTEMI (non-ST elevated myocardial infarction) (HCC) 2007   Stroke [facility]) 2004   TIA (transient ischemic attack) 2006    Surgical History: Past Surgical History:  Procedure Laterality Date   AORTA - BILATERAL FEMORAL ARTERY BYPASS GRAFT  01/08/2012   Procedure: AORTA BIFEMORAL BYPASS GRAFT;  Surgeon: Sherren Kerns, MD;  Location: [facility] OR;  Service: Vascular;  Laterality: Bilateral;  using 18x15mm x 40cm Hemashield Gold Vascular Graft with Endarterectomy, Thombectomy and  Reimplantation of Inferior  Mesenteric Artery   BACK SURGERY  2021   BRONCHIAL BIOPSY  06/11/2021   Procedure: BRONCHIAL BIOPSIES;  Surgeon: Leslye Peer, MD;  Location: [facility] ENDOSCOPY;  Service: Pulmonary;;   BRONCHIAL BRUSHINGS  06/11/2021   Procedure: BRONCHIAL BRUSHINGS;  Surgeon: Leslye Peer, MD;  Location: [facility] ENDOSCOPY;  Service: Pulmonary;;   BRONCHIAL NEEDLE ASPIRATION BIOPSY  06/11/2021   Procedure: BRONCHIAL NEEDLE ASPIRATION BIOPSIES;  Surgeon: Leslye Peer, MD;  Location: [facility] ENDOSCOPY;  Service: Pulmonary;;   CARDIOVERSION N/A 09/20/2021   Procedure: CARDIOVERSION;  Surgeon: [name], MD;  Location: [facility] ORS;  Service: Cardiovascular;  Laterality: N/A;   COLONOSCOPY N/A 04/14/2019   Procedure: COLONOSCOPY;  Surgeon: Corbin Ade, MD;  Location: [facility] ENDO SUITE;  Service: Endoscopy;  Laterality: N/A;  9:30   COLONOSCOPY WITH PROPOFOL N/A 07/25/2021   Procedure: COLONOSCOPY WITH PROPOFOL;  Surgeon: Lanelle Bal, DO;  Location: [facility] ENDO  SUITE;  Service: Endoscopy;  Laterality: N/A;  1:00pm   FIDUCIAL MARKER PLACEMENT  06/11/2021   Procedure: FIDUCIAL MARKER PLACEMENT;  Surgeon: Leslye Peer, MD;  Location: [facility] ENDOSCOPY;  Service: Pulmonary;;   HIP ARTHROPLASTY Right 04/28/2022   Procedure: ARTHROPLASTY BIPOLAR HIP (HEMIARTHROPLASTY);  Surgeon: Sheral Apley, MD;  Location: [facility] OR;  Service: Orthopedics;  Laterality: Right;   INTERCOSTAL NERVE BLOCK Right 11/12/2021   Procedure: INTERCOSTAL NERVE BLOCK;  Surgeon: Loreli Slot, MD;  Location: [facility] OR;  Service: Thoracic;  Laterality: Right;   IR IMAGING GUIDED PORT INSERTION  07/31/2021   Left cataract surgery     LYMPH NODE DISSECTION Right 11/12/2021   Procedure: LYMPH NODE DISSECTION;  Surgeon: Loreli Slot, MD;  Location: [facility] OR;  Service: Thoracic;  Laterality: Right;   POLYPECTOMY  04/14/2019   Procedure: POLYPECTOMY;  Surgeon: Corbin Ade, MD;  [facility] END[facility];  Service: Endoscopy;;   POLYPECTOMY  07/25/2021   Procedure: POLYPECTOMY;  Surgeon: Lanelle Bal, DO;  [facility] END[facility] SU[facility];  Service: Endoscopy;;   TEE WITHOUT CARDIOVERSION N/A 09/20/2021   Procedure: TRANSESOPHAGEAL ECHOCARDIOGRAM (TEE);  Surgeon: [name], MD;  Location: [facility] ORS;  Service: Cardiovascular;  Laterality: N/A;   TRANSFORAMINAL LUMBAR INTERBODY FUSION (TLIF) WITH PEDICLE SCREW FIXATION 1 LEVEL N/A 04/27/2020   Procedure: Transforaminal Lumbar Interbody Fusion Lumbar Five-Sacral One;  Surgeon: Bedelia Person, MD;  Location: [facility] OR;  Service: Neurosurgery;  Laterality: N/A;   VIDEO BRONCHOSCOPY WITH INSERTION OF INTERBRONCHIAL VALVE (IBV) N/A 11/22/2021   Procedure: VIDEO BRONCHOSCOPY WITH INSERTION OF INTERBRONCHIAL VALVE (IBV);  Surgeon: Loreli Slot, MD;  Location: [facility] OR;  Service: Thoracic;  Laterality: N/A;   VIDEO BRONCHOSCOPY WITH INSERTION OF INTERBRONCHIAL VALVE (IBV) N/A 01/24/2022   Procedure: VIDEO BRONCHOSCOPY WITH REMOVAL OF  INTERBRONCHIAL VALVE (IBV);  Surgeon: Loreli Slot, MD;  Location: [facility] OR;  Service: Thoracic;  Laterality: N/A;   VIDEO BRONCHOSCOPY WITH RADIAL ENDOBRONCHIAL ULTRASOUND  06/11/2021   Procedure: VIDEO BRONCHOSCOPY WITH RADIAL ENDOBRONCHIAL ULTRASOUND;  Surgeon: Leslye Peer, MD;  Location: [facility] ENDOSCOPY;  Service: Pulmonary;;    Social History: Social History   Socioeconomic History   Marital status: Divorced    Spouse name: Not on file   Number of children: 1   Years of education: 11   Highest education level: 11th grade  Occupational History    Employer: Engineer, materials  Tobacco Use   Smoking status: Former    Packs/day: 1.00    Years: 40.00    Additional  pack years: 0.00    Total pack years: 40.00    Types: Cigarettes    Quit date: 07/2021    Years since quitting: 0.8   Smokeless tobacco: Never   Tobacco comments:    1 pack of cigarettes smoked daily. 07/17/21 ARJ, RN   Vaping Use   Vaping Use: Never used  Substance and Sexual Activity   Alcohol use: No    Comment: Prior history of regular alcohol use   Drug use: No   Sexual activity: Not Currently  Other Topics Concern   Not on file  Social History Narrative   Not on file   Social Determinants of Health   Financial Resource Strain: High Risk (06/11/2022)   Overall Financial Resource Strain (CARDIA)    Difficulty of Paying Living Expenses: Hard  Food Insecurity: Patient Declined (06/11/2022)   Hunger Vital Sign    Worried About Running Out of Food in the Last Year: Patient declined    Ran Out of Food in the Last Year: Patient declined  Transportation Needs: Unmet Transportation Needs (06/11/2022)   PRAPARE - Administrator, Civil Service (Medical): Yes    Lack of Transportation (Non-Medical): Yes  Physical Activity: Unknown (06/11/2022)   Exercise Vital Sign    Days of Exercise per Week: 0 days    Minutes of Exercise per Session: Not on file  Stress: No Stress Concern Present (06/11/2022)    [employer] of Occupational Health - Occupational Stress Questionnaire    Feeling of Stress : Not at all  Social Connections: Socially Isolated (06/11/2022)   Social Connection and Isolation Panel [NHANES]    Frequency of Communication with Friends and Family: Once a week    Frequency of Social Gatherings with Friends and Family: Never    Attends Religious Services: Never    Database administrator or Organizations: No    Attends Engineer, structural: Not on file    Marital Status: Divorced  Intimate Partner Violence: Not At Risk (05/03/2022)   Humiliation, Afraid, Rape, and Kick questionnaire    Fear of Current or Ex-Partner: No    Emotionally Abused: No    Physically Abused: No    Sexually Abused: No    Family History: Family History  Problem Relation Age of Onset   Hyperlipidemia Sister    Heart attack Brother 38   Cancer - Colon Neg Hx     Current Medications:  Current Outpatient Medications:    acetaminophen (TYLENOL) 500 MG tablet, GIVE 2 TABLETS (1,000 mg TOTAL) BY MOUTH EVERY 6 HOURS AS NEEDED FOR PAIN, Disp: 60 tablet, Rfl: 0   albuterol (VENTOLIN HFA) 108 (90 Base) MCG/ACT inhaler, Inhale 2 puffs into the lungs every 6 (six) hours as needed for wheezing or shortness of breath., Disp: 6.7 g, Rfl: 6   apixaban (ELIQUIS) 5 MG TABS tablet, Take 1 tablet (5 mg total) by mouth 2 (two) times daily., Disp: 30 tablet, Rfl: 0   atorvastatin (LIPITOR) 80 MG tablet, Take 1 tablet (80 mg total) by mouth daily., Disp: 15 tablet, Rfl: 0   cyclobenzaprine (FLEXERIL) 10 MG tablet, Take 1 tablet (10 mg total) by mouth 2 (two) times daily as needed., Disp: 30 tablet, Rfl: 0   escitalopram (LEXAPRO) 10 MG tablet, Take 1 tablet (10 mg total) by mouth daily., Disp: 15 tablet, Rfl: 0   folic acid (KP FOLIC ACID) 1 MG tablet, Take 1 tablet (1 mg total) by mouth daily., Disp: 15 tablet, Rfl: 0  furosemide (LASIX) 20 MG tablet, Take 1 tablet (20 mg total) by mouth daily as  needed., Disp: 15 tablet, Rfl: 0   HYDROcodone-acetaminophen (NORCO/VICODIN) 5-325 MG tablet, Take 1 tablet by mouth every 6 (six) hours as needed, for pain., Disp: 60 tablet, Rfl: 0   magnesium oxide (MAG-OX) 400 (240 Mg) MG tablet, Take 1 tablet (400 mg total) by mouth daily., Disp: 15 tablet, Rfl: 0   metoprolol succinate (TOPROL-XL) 25 MG 24 hr tablet, Take 3 tablets (75 mg total) by mouth daily. Take with or immediately following a meal. Do Not Crush., Disp: 45 tablet, Rfl: 0   pantoprazole (PROTONIX) 40 MG tablet, Take 1 tablet (40 mg total) by mouth daily., Disp: 15 tablet, Rfl: 0   tamsulosin (FLOMAX) 0.4 MG CAPS capsule, Take 1 capsule (0.4 mg total) by mouth daily., Disp: 90 capsule, Rfl: 3   Tiotropium Bromide Monohydrate (SPIRIVA RESPIMAT) 2.5 MCG/ACT AERS, INHALE 2 PUFFS INTO THE LUNGS ONCE DAILY, Disp: 18 g, Rfl: 0   Allergies: No Known Allergies  REVIEW OF SYSTEMS:   Review of Systems  Constitutional:  Negative for chills, fatigue and fever.  HENT:   Negative for lump/mass, mouth sores, nosebleeds, sore throat and trouble swallowing.   Eyes:  Negative for eye problems.  Respiratory:  Negative for cough and shortness of breath.   Cardiovascular:  Negative for chest pain, leg swelling and palpitations.  Gastrointestinal:  Negative for abdominal pain, constipation, diarrhea, nausea and vomiting.  Genitourinary:  Positive for frequency. Negative for bladder incontinence, difficulty urinating, dysuria, hematuria and nocturia.   Musculoskeletal:  Positive for arthralgias. Negative for back pain, flank pain, myalgias and neck pain.  Skin:  Negative for itching and rash.  Neurological:  Positive for numbness. Negative for dizziness and headaches.  Hematological:  Does not bruise/bleed easily.  Psychiatric/Behavioral:  Negative for depression, sleep disturbance and suicidal ideas. The patient is not nervous/anxious.   All other systems reviewed and are negative.    VITALS:    Blood pressure 94/61, pulse 60.  Wt Readings from Last 3 Encounters:  06/13/22 153 lb 12.8 oz (69.8 kg)  06/12/22 156 lb (70.8 kg)  05/02/22 162 lb 14.7 oz (73.9 kg)    There is no height or weight on file to calculate BMI.  Performance status (ECOG): 1 - Symptomatic but completely ambulatory  PHYSICAL EXAM:   Physical Exam Vitals and nursing note reviewed. Exam conducted with a chaperone present.  Constitutional:      Appearance: Normal appearance.  Cardiovascular:     Rate and Rhythm: Normal rate and regular rhythm.     Pulses: Normal pulses.     Heart sounds: Normal heart sounds.  Pulmonary:     Effort: Pulmonary effort is normal.     Breath sounds: Normal breath sounds.  Abdominal:     Palpations: Abdomen is soft. There is no hepatomegaly, splenomegaly or mass.     Tenderness: There is no abdominal tenderness.  Musculoskeletal:     Right lower leg: No edema.     Left lower leg: No edema.  Lymphadenopathy:     Cervical: No cervical adenopathy.     Right cervical: No superficial, deep or posterior cervical adenopathy.    Left cervical: No superficial, deep or posterior cervical adenopathy.     Upper Body:     Right upper body: No supraclavicular or axillary adenopathy.     Left upper body: No supraclavicular or axillary adenopathy.  Neurological:     General: No focal  deficit present.     Mental Status: He is alert and oriented to person, place, and time.  Psychiatric:        Mood and Affect: Mood normal.        Behavior: Behavior normal.     LABS:      Latest Ref Rng & Units 06/13/2022    8:13 AM 06/12/2022    4:20 PM 05/01/2022    7:45 PM  CBC  WBC 4.0 - 10.5 K/uL 6.4  6.7  9.0   Hemoglobin 13.0 - 17.0 g/dL 9.7  16.1  8.9   Hematocrit 39.0 - 52.0 % 29.4  29.8  27.6   Platelets 150 - 400 K/uL 125  135  139       Latest Ref Rng & Units 06/13/2022    8:13 AM 06/12/2022    4:20 PM 05/01/2022    7:45 PM  CMP  Glucose 70 - 99 mg/dL 096  045  409   BUN 8 -  23 mg/dL 14  15  19    Creatinine 0.61 - 1.24 mg/dL 8.11  9.14  7.82   Sodium 135 - 145 mmol/L 138  140  136   Potassium 3.5 - 5.1 mmol/L 3.7  4.7  4.0   Chloride 98 - 111 mmol/L 103  102  104   CO2 22 - 32 mmol/L 25  23  26    Calcium 8.9 - 10.3 mg/dL 9.0  9.4  8.8   Total Protein 6.5 - 8.1 g/dL 6.3  6.4  6.1   Total Bilirubin 0.3 - 1.2 mg/dL 0.6  0.6  0.6   Alkaline Phos 38 - 126 U/L 63  76  54   AST 15 - 41 U/L 26  23  29    ALT 0 - 44 U/L 17  13  11       No results found for: "CEA1", "CEA" / No results found for: "CEA1", "CEA" Lab Results  Component Value Date   PSA1 1.2 11/25/2019   No results found for: "NFA213" No results found for: "CAN125"  No results found for: "TOTALPROTELP", "ALBUMINELP", "A1GS", "A2GS", "BETS", "BETA2SER", "GAMS", "MSPIKE", "SPEI" Lab Results  Component Value Date   TIBC 253 06/13/2022   TIBC 291 04/01/2022   TIBC 297 03/11/2022   FERRITIN 501 (H) 06/13/2022   FERRITIN 86 04/01/2022   FERRITIN 117 03/11/2022   IRONPCTSAT 24 06/13/2022   IRONPCTSAT 22 04/01/2022   IRONPCTSAT 24 03/11/2022   No results found for: "LDH"   STUDIES:   No results found.

## 2022-06-12 NOTE — Patient Instructions (Addendum)
Labs today.  Medication as prescribed to help you urinate.  Follow up in 3 months.

## 2022-06-13 ENCOUNTER — Inpatient Hospital Stay: Payer: HMO | Attending: Hematology

## 2022-06-13 ENCOUNTER — Encounter: Payer: Self-pay | Admitting: Hematology

## 2022-06-13 ENCOUNTER — Inpatient Hospital Stay: Payer: HMO

## 2022-06-13 ENCOUNTER — Inpatient Hospital Stay (HOSPITAL_BASED_OUTPATIENT_CLINIC_OR_DEPARTMENT_OTHER): Payer: HMO | Admitting: Hematology

## 2022-06-13 ENCOUNTER — Other Ambulatory Visit (HOSPITAL_COMMUNITY): Payer: Self-pay

## 2022-06-13 VITALS — BP 93/65 | HR 62 | Temp 97.5°F | Resp 16

## 2022-06-13 VITALS — BP 94/61 | HR 60

## 2022-06-13 DIAGNOSIS — I10 Essential (primary) hypertension: Secondary | ICD-10-CM | POA: Diagnosis not present

## 2022-06-13 DIAGNOSIS — Z801 Family history of malignant neoplasm of trachea, bronchus and lung: Secondary | ICD-10-CM | POA: Insufficient documentation

## 2022-06-13 DIAGNOSIS — I252 Old myocardial infarction: Secondary | ICD-10-CM | POA: Insufficient documentation

## 2022-06-13 DIAGNOSIS — D509 Iron deficiency anemia, unspecified: Secondary | ICD-10-CM

## 2022-06-13 DIAGNOSIS — J449 Chronic obstructive pulmonary disease, unspecified: Secondary | ICD-10-CM | POA: Insufficient documentation

## 2022-06-13 DIAGNOSIS — Z79899 Other long term (current) drug therapy: Secondary | ICD-10-CM | POA: Insufficient documentation

## 2022-06-13 DIAGNOSIS — Z87891 Personal history of nicotine dependence: Secondary | ICD-10-CM | POA: Insufficient documentation

## 2022-06-13 DIAGNOSIS — I714 Abdominal aortic aneurysm, without rupture, unspecified: Secondary | ICD-10-CM | POA: Diagnosis not present

## 2022-06-13 DIAGNOSIS — I4891 Unspecified atrial fibrillation: Secondary | ICD-10-CM | POA: Diagnosis not present

## 2022-06-13 DIAGNOSIS — C3411 Malignant neoplasm of upper lobe, right bronchus or lung: Secondary | ICD-10-CM

## 2022-06-13 DIAGNOSIS — Z9221 Personal history of antineoplastic chemotherapy: Secondary | ICD-10-CM | POA: Insufficient documentation

## 2022-06-13 DIAGNOSIS — Z7901 Long term (current) use of anticoagulants: Secondary | ICD-10-CM | POA: Insufficient documentation

## 2022-06-13 DIAGNOSIS — Z8673 Personal history of transient ischemic attack (TIA), and cerebral infarction without residual deficits: Secondary | ICD-10-CM | POA: Insufficient documentation

## 2022-06-13 DIAGNOSIS — I2510 Atherosclerotic heart disease of native coronary artery without angina pectoris: Secondary | ICD-10-CM | POA: Diagnosis not present

## 2022-06-13 DIAGNOSIS — D649 Anemia, unspecified: Secondary | ICD-10-CM | POA: Insufficient documentation

## 2022-06-13 DIAGNOSIS — E785 Hyperlipidemia, unspecified: Secondary | ICD-10-CM | POA: Insufficient documentation

## 2022-06-13 DIAGNOSIS — F32A Depression, unspecified: Secondary | ICD-10-CM | POA: Insufficient documentation

## 2022-06-13 DIAGNOSIS — N401 Enlarged prostate with lower urinary tract symptoms: Secondary | ICD-10-CM | POA: Insufficient documentation

## 2022-06-13 LAB — CBC WITH DIFFERENTIAL/PLATELET
Abs Immature Granulocytes: 0.02 10*3/uL (ref 0.00–0.07)
Basophils Absolute: 0 10*3/uL (ref 0.0–0.1)
Basophils Relative: 1 %
Eosinophils Absolute: 0.4 10*3/uL (ref 0.0–0.5)
Eosinophils Relative: 6 %
HCT: 29.4 % — ABNORMAL LOW (ref 39.0–52.0)
Hemoglobin: 9.7 g/dL — ABNORMAL LOW (ref 13.0–17.0)
Immature Granulocytes: 0 %
Lymphocytes Relative: 21 %
Lymphs Abs: 1.4 10*3/uL (ref 0.7–4.0)
MCH: 31.8 pg (ref 26.0–34.0)
MCHC: 33 g/dL (ref 30.0–36.0)
MCV: 96.4 fL (ref 80.0–100.0)
Monocytes Absolute: 0.6 10*3/uL (ref 0.1–1.0)
Monocytes Relative: 9 %
Neutro Abs: 4.1 10*3/uL (ref 1.7–7.7)
Neutrophils Relative %: 63 %
Platelets: 125 10*3/uL — ABNORMAL LOW (ref 150–400)
RBC: 3.05 MIL/uL — ABNORMAL LOW (ref 4.22–5.81)
RDW: 14.8 % (ref 11.5–15.5)
WBC: 6.4 10*3/uL (ref 4.0–10.5)
nRBC: 0 % (ref 0.0–0.2)

## 2022-06-13 LAB — URINALYSIS, ROUTINE W REFLEX MICROSCOPIC
Bilirubin Urine: NEGATIVE
Glucose, UA: NEGATIVE mg/dL
Hgb urine dipstick: NEGATIVE
Ketones, ur: NEGATIVE mg/dL
Leukocytes,Ua: NEGATIVE
Nitrite: NEGATIVE
Protein, ur: NEGATIVE mg/dL
Specific Gravity, Urine: 1.011 (ref 1.005–1.030)
pH: 5 (ref 5.0–8.0)

## 2022-06-13 LAB — CBC
Hematocrit: 29.8 % — ABNORMAL LOW (ref 37.5–51.0)
Hemoglobin: 10.1 g/dL — ABNORMAL LOW (ref 13.0–17.7)
MCH: 31.2 pg (ref 26.6–33.0)
MCHC: 33.9 g/dL (ref 31.5–35.7)
MCV: 92 fL (ref 79–97)
Platelets: 135 10*3/uL — ABNORMAL LOW (ref 150–450)
RBC: 3.24 x10E6/uL — ABNORMAL LOW (ref 4.14–5.80)
RDW: 13.7 % (ref 11.6–15.4)
WBC: 6.7 10*3/uL (ref 3.4–10.8)

## 2022-06-13 LAB — COMPREHENSIVE METABOLIC PANEL
ALT: 17 U/L (ref 0–44)
AST: 26 U/L (ref 15–41)
Albumin: 3.2 g/dL — ABNORMAL LOW (ref 3.5–5.0)
Alkaline Phosphatase: 63 U/L (ref 38–126)
Anion gap: 10 (ref 5–15)
BUN: 14 mg/dL (ref 8–23)
CO2: 25 mmol/L (ref 22–32)
Calcium: 9 mg/dL (ref 8.9–10.3)
Chloride: 103 mmol/L (ref 98–111)
Creatinine, Ser: 1.08 mg/dL (ref 0.61–1.24)
GFR, Estimated: >60 mL/min (ref >60)
Glucose, Bld: 105 mg/dL — ABNORMAL HIGH (ref 70–99)
Potassium: 3.7 mmol/L (ref 3.5–5.1)
Sodium: 138 mmol/L (ref 135–145)
Total Bilirubin: 0.6 mg/dL (ref 0.3–1.2)
Total Protein: 6.3 g/dL — ABNORMAL LOW (ref 6.5–8.1)

## 2022-06-13 LAB — CMP14+EGFR
ALT: 13 IU/L (ref 0–44)
AST: 23 IU/L (ref 0–40)
Albumin/Globulin Ratio: 1.4 (ref 1.2–2.2)
Albumin: 3.7 g/dL — ABNORMAL LOW (ref 3.8–4.8)
Alkaline Phosphatase: 76 IU/L (ref 44–121)
BUN/Creatinine Ratio: 14 (ref 10–24)
BUN: 15 mg/dL (ref 8–27)
Bilirubin Total: 0.6 mg/dL (ref 0.0–1.2)
CO2: 23 mmol/L (ref 20–29)
Calcium: 9.4 mg/dL (ref 8.6–10.2)
Chloride: 102 mmol/L (ref 96–106)
Creatinine, Ser: 1.11 mg/dL (ref 0.76–1.27)
Globulin, Total: 2.7 g/dL (ref 1.5–4.5)
Glucose: 120 mg/dL — ABNORMAL HIGH (ref 70–99)
Potassium: 4.7 mmol/L (ref 3.5–5.2)
Sodium: 140 mmol/L (ref 134–144)
Total Protein: 6.4 g/dL (ref 6.0–8.5)
eGFR: 69 mL/min/{1.73_m2} (ref >59)

## 2022-06-13 LAB — MAGNESIUM: Magnesium: 1.7 mg/dL (ref 1.7–2.4)

## 2022-06-13 LAB — IRON AND TIBC
Iron: 60 ug/dL (ref 45–182)
Saturation Ratios: 24 % (ref 17.9–39.5)
TIBC: 253 ug/dL (ref 250–450)
UIBC: 193 ug/dL

## 2022-06-13 LAB — TSH: TSH: 1.239 u[IU]/mL (ref 0.350–4.500)

## 2022-06-13 LAB — FERRITIN: Ferritin: 501 ng/mL — ABNORMAL HIGH (ref 24–336)

## 2022-06-13 MED ORDER — SODIUM CHLORIDE 0.9% FLUSH
10.0000 mL | Freq: Once | INTRAVENOUS | Status: AC | PRN
  Administered 2022-06-13: 10 mL

## 2022-06-13 MED ORDER — HEPARIN SOD (PORK) LOCK FLUSH 100 UNIT/ML IV SOLN
500.0000 [IU] | Freq: Once | INTRAVENOUS | Status: AC | PRN
  Administered 2022-06-13: 500 [IU]

## 2022-06-13 MED ORDER — SODIUM CHLORIDE 0.9 % IV SOLN: Freq: Once | INTRAVENOUS | Status: AC

## 2022-06-13 MED ORDER — SODIUM CHLORIDE 0.9 % IV SOLN
510.0000 mg | Freq: Once | INTRAVENOUS | Status: AC
  Administered 2022-06-13: 510 mg via INTRAVENOUS
  Filled 2022-06-13: qty 510

## 2022-06-13 MED ORDER — SODIUM CHLORIDE 0.9% FLUSH
10.0000 mL | INTRAVENOUS | Status: AC
  Administered 2022-06-13: 10 mL via INTRAVENOUS

## 2022-06-13 NOTE — Addendum Note (Signed)
Addended by: Harrel Lemon on: 06/13/2022 09:31 AM   Modules accepted: Orders

## 2022-06-13 NOTE — Addendum Note (Signed)
Addended by: Geronimo Boot T on: 06/13/2022 10:12 AM   Modules accepted: Orders

## 2022-06-13 NOTE — Patient Instructions (Signed)

## 2022-06-13 NOTE — Progress Notes (Signed)
Subjective:  Patient ID: Darryl Ward, male    DOB: 1945/12/24  Age: 77 y.o. MRN: [id number]  CC: Chief Complaint  Patient presents with   discharge from rehab facility     For hip fracture    HPI:  77 year old male with an extensive past medical history presents for follow-up.  Patient recently suffered a fall and hip fracture.  Overall he is doing well postoperatively.  He has seen his orthopedic surgeon.  Surgical site is well-healed.  He is ambulating with a walker.  His significant other states that he is mostly back to his normal self.  He still has times where he seems to be confused particularly in regards to time.  Patient reports to me that he has had some difficulty urinating.  He has trouble getting started.  Once he gets started he has a good stream.  Denies any dysuria or urinary frequency.  Patient Active Problem List   Diagnosis Date Noted   Benign prostatic hyperplasia with lower urinary tract symptoms 06/13/2022   Other intervertebral disc degeneration, lumbar region 05/01/2022   Hip fracture (HCC) 04/28/2022   Normocytic anemia 04/28/2022   Iron deficiency anemia 04/22/2022   History of stroke 03/27/2022   Chronic combined systolic and diastolic CHF (congestive heart failure) (HCC) 03/27/2022   PAF (paroxysmal atrial fibrillation) (HCC) 12/23/2021   Essential hypertension 09/30/2021   Primary adenocarcinoma of upper lobe of right lung (HCC) 06/26/2021   COPD (chronic obstructive pulmonary disease) (HCC) 05/29/2021   Lumbar spinal stenosis 02/06/2020   Meningioma, cerebral (HCC) 02/05/2020   Chronic midline low back pain without sciatica 04/27/2019   Tobacco abuse 11/17/2018   PVD (peripheral vascular disease) (HCC) 05/21/2012   Mixed hyperlipidemia 12/13/2011   Coronary atherosclerosis of native coronary artery 12/13/2011    Social Hx   Social History   Socioeconomic History   Marital status: Divorced    Spouse name: Not on file   Number of  children: 1   Years of education: 11   Highest education level: 11th grade  Occupational History    Employer: Engineer, materials  Tobacco Use   Smoking status: Former    Packs/day: 1.00    Years: 40.00    Additional pack years: 0.00    Total pack years: 40.00    Types: Cigarettes    Quit date: 07/2021    Years since quitting: 0.8   Smokeless tobacco: Never   Tobacco comments:    1 pack of cigarettes smoked daily. 07/17/21 [facility], RN   Vaping Use   Vaping Use: Never used  Substance and Sexual Activity   Alcohol use: No    Comment: Prior history of regular alcohol use   Drug use: No   Sexual activity: Not Currently  Other Topics Concern   Not on file  Social History Narrative   Not on file   Social Determinants of Health   Financial Resource Strain: High Risk (06/11/2022)   Overall Financial Resource Strain (CARDIA)    Difficulty of Paying Living Expenses: Hard  Food Insecurity: Patient Declined (06/11/2022)   Hunger Vital Sign    Worried About Running Out of Food in the Last Year: Patient declined    Ran Out of Food in the Last Year: Patient declined  Transportation Needs: Unmet Transportation Needs (06/11/2022)   PRAPARE - Administrator, Civil Service (Medical): Yes    Lack of Transportation (Non-Medical): Yes  Physical Activity: Unknown (06/11/2022)   Exercise Vital Sign  Days of Exercise per Week: 0 days    Minutes of Exercise per Session: Not on file  Stress: No Stress Concern Present (06/11/2022)   [employer] of Occupational Health - Occupational Stress Questionnaire    Feeling of Stress : Not at all  Social Connections: Socially Isolated (06/11/2022)   Social Connection and Isolation Panel [NHANES]    Frequency of Communication with Friends and Family: Once a week    Frequency of Social Gatherings with Friends and Family: Never    Attends Religious Services: Never    Diplomatic Services operational officer: No    Attends Hospital doctor: Not on file    Marital Status: Divorced    Review of Systems Per HPI  Objective:  BP 102/61   Pulse 61   Temp 97.7 F (36.5 C)   Ht 5\' 10"  (1.778 m)   Wt 156 lb (70.8 kg)   SpO2 100%   BMI 22.38 kg/m      06/13/2022   10:02 AM 06/13/2022    8:59 AM 06/13/2022    8:09 AM  BP/Weight  Systolic BP 93 94 72  Diastolic BP 65 61 62  Wt. (Lbs)   153.8  BMI   22.07 kg/m2    Physical Exam Vitals and nursing note reviewed.  Constitutional:      General: He is not in acute distress. HENT:     Head: Normocephalic and atraumatic.  Eyes:     General:        Right eye: No discharge.        Left eye: No discharge.     Conjunctiva/sclera: Conjunctivae normal.  Cardiovascular:     Rate and Rhythm: Normal rate and regular rhythm.  Pulmonary:     Effort: Pulmonary effort is normal.     Breath sounds: Normal breath sounds. No wheezing, rhonchi or rales.  Abdominal:     General: There is no distension.     Palpations: Abdomen is soft.     Tenderness: There is no abdominal tenderness.  Musculoskeletal:     Comments: Surgical site is well-healed.  Neurological:     Mental Status: He is alert.     Lab Results  Component Value Date   WBC 6.4 06/13/2022   HGB 9.7 (L) 06/13/2022   HCT 29.4 (L) 06/13/2022   PLT 125 (L) 06/13/2022   GLUCOSE 105 (H) 06/13/2022   CHOL 202 (H) 02/05/2020   TRIG 99 02/05/2020   HDL 29 (L) 02/05/2020   LDLCALC 153 (H) 02/05/2020   ALT 17 06/13/2022   AST 26 06/13/2022   NA 138 06/13/2022   K 3.7 06/13/2022   CL 103 06/13/2022   CREATININE 1.08 06/13/2022   BUN 14 06/13/2022   CO2 25 06/13/2022   TSH 1.239 06/13/2022   INR 1.3 (H) 04/27/2022   HGBA1C 5.1 02/05/2020     Assessment & Plan:   Problem List Items Addressed This Visit       Cardiovascular and Mediastinum   Essential hypertension   Relevant Orders   CMP14+EGFR (Completed)   RESOLVED: Cerebral thrombosis with cerebral infarction [facility])     Genitourinary   Benign  prostatic hyperplasia with lower urinary tract symptoms - Primary    Suspect patient has BPH.  He could not provide a urine sample today.  Placing on Flomax.      Relevant Medications   tamsulosin (FLOMAX) 0.4 MG CAPS capsule   Other Relevant Orders   POCT URINALYSIS DIP (  CLIN[facility]K)     Other   Normocytic anemia    Rechecking CBC today.      Relevant Orders   CBC (Completed)   Tobacco abuse    Meds ordered this encounter  Medications   tamsulosin (FLOMAX) 0.4 MG CAPS capsule    Sig: Take 1 capsule (0.4 mg total) by mouth daily.    Dispense:  90 capsule    Refill:  3    Follow-up:  Return in about 3 months (around 09/12/2022).  Everlene Other DO [facility]

## 2022-06-13 NOTE — Assessment & Plan Note (Signed)
Rechecking CBC today

## 2022-06-13 NOTE — Assessment & Plan Note (Signed)
Suspect patient has BPH.  He could not provide a urine sample today.  Placing on Flomax.

## 2022-06-13 NOTE — Patient Instructions (Addendum)
[facility] at [facility] Discharge Instructions   You were seen and examined today by Dr. Ellin Saba.  He reviewed the results of your lab work which are normal/stable.   We will proceed with your treatment today.   Cut back the Toprol XL to 50 mg daily.   Return as scheduled.    Thank you for choosing [facility] at [facility] to provide your oncology and hematology care.  To afford each patient quality time with our provider, please arrive at least 15 minutes before your scheduled appointment time.   If you have a lab appointment with the Cancer Center please come in thru the Main Entrance and check in at the main information desk.  You need to re-schedule your appointment should you arrive 10 or more minutes late.  We strive to give you quality time with our providers, and arriving late affects you and other patients whose appointments are after yours.  Also, if you no show three or more times for appointments you may be dismissed from the clinic at the providers discretion.     Again, thank you for choosing [facility].  Our hope is that these requests will decrease the amount of time that you wait before being seen by our physicians.       _____________________________________________________________  Should you have questions after your visit to [facility], please contact our office at [phone] and follow the prompts.  Our office hours are 8:00 a.m. and 4:30 p.m. Monday - Friday.  Please note that voicemails left after 4:00 p.m. may not be returned until the following business day.  We are closed weekends and major holidays.  You do have access to a nurse 24-7, just call the main number to the clinic [phone] and do not press any options, hold on the line and a nurse will answer the phone.    For prescription refill requests, have your pharmacy contact our office and allow 72 hours.    Due to Covid,  you will need to wear a mask upon entering the hospital. If you do not have a mask, a mask will be given to you at the Main Entrance upon arrival. For doctor visits, patients may have 1 support person age 30 or older with them. For treatment visits, patients can not have anyone with them due to social distancing guidelines and our immunocompromised population.

## 2022-06-13 NOTE — Addendum Note (Signed)
Addended by: Stephens Shire on: 06/13/2022 09:13 AM   Modules accepted: Orders

## 2022-06-18 ENCOUNTER — Encounter: Payer: Self-pay | Admitting: Family Medicine

## 2022-06-18 NOTE — Telephone Encounter (Signed)
Cook, Jayce G, DO     Please refill. Thank you   

## 2022-06-19 ENCOUNTER — Other Ambulatory Visit: Payer: Self-pay

## 2022-06-19 ENCOUNTER — Other Ambulatory Visit (HOSPITAL_COMMUNITY): Payer: Self-pay

## 2022-06-19 MED ORDER — MAGNESIUM OXIDE -MG SUPPLEMENT 400 (240 MG) MG PO TABS
400.0000 mg | ORAL_TABLET | Freq: Every day | ORAL | 3 refills | Status: DC
  Filled 2022-06-19: qty 90, 90d supply, fill #0
  Filled 2022-06-21: qty 120, 120d supply, fill #0
  Filled 2022-09-05 – 2022-10-14 (×2): qty 120, 120d supply, fill #1
  Filled 2023-02-01: qty 120, 120d supply, fill #2
  Filled 2023-06-15: qty 120, 120d supply, fill #3

## 2022-06-19 MED ORDER — FUROSEMIDE 20 MG PO TABS
20.0000 mg | ORAL_TABLET | Freq: Every day | ORAL | 2 refills | Status: DC | PRN
  Filled 2022-06-19 – 2022-07-08 (×3): qty 90, 90d supply, fill #0
  Filled 2022-09-05 – 2022-10-20 (×2): qty 90, 90d supply, fill #1
  Filled 2023-02-03: qty 90, 90d supply, fill #2

## 2022-06-19 MED ORDER — CYCLOBENZAPRINE HCL 10 MG PO TABS
10.0000 mg | ORAL_TABLET | Freq: Two times a day (BID) | ORAL | 2 refills | Status: DC | PRN
  Filled 2022-06-19 – 2022-09-05 (×3): qty 60, 30d supply, fill #0
  Filled 2023-02-03: qty 60, 30d supply, fill #1

## 2022-06-19 MED ORDER — PANTOPRAZOLE SODIUM 40 MG PO TBEC
40.0000 mg | DELAYED_RELEASE_TABLET | Freq: Every day | ORAL | 3 refills | Status: DC
  Filled 2022-06-19 – 2022-06-24 (×3): qty 90, 90d supply, fill #0
  Filled 2022-09-05 – 2022-09-22 (×2): qty 90, 90d supply, fill #1
  Filled 2022-10-20 – 2022-12-18 (×2): qty 90, 90d supply, fill #2
  Filled 2023-03-21: qty 90, 90d supply, fill #3

## 2022-06-19 MED ORDER — APIXABAN 5 MG PO TABS
5.0000 mg | ORAL_TABLET | Freq: Two times a day (BID) | ORAL | 3 refills | Status: DC
  Filled 2022-06-19 – 2022-07-16 (×3): qty 180, 90d supply, fill #0
  Filled 2022-09-05 – 2022-10-14 (×2): qty 180, 90d supply, fill #1
  Filled 2023-01-12: qty 180, 90d supply, fill #2
  Filled 2023-04-21: qty 180, 90d supply, fill #3

## 2022-06-19 MED ORDER — ESCITALOPRAM OXALATE 10 MG PO TABS
10.0000 mg | ORAL_TABLET | Freq: Every day | ORAL | 3 refills | Status: DC
  Filled 2022-06-19 – 2022-09-05 (×3): qty 90, 90d supply, fill #0
  Filled 2022-12-01: qty 90, 90d supply, fill #1
  Filled 2023-02-03 – 2023-02-27 (×2): qty 90, 90d supply, fill #2
  Filled 2023-05-30: qty 90, 90d supply, fill #3

## 2022-06-19 MED ORDER — SPIRIVA RESPIMAT 2.5 MCG/ACT IN AERS
2.0000 | INHALATION_SPRAY | Freq: Every day | RESPIRATORY_TRACT | 3 refills | Status: DC
  Filled 2022-06-19 – 2022-06-21 (×2): qty 20, 150d supply, fill #0
  Filled 2022-09-05: qty 4, 30d supply, fill #0
  Filled 2023-03-21: qty 4, 30d supply, fill #1

## 2022-06-19 MED ORDER — FOLIC ACID 1 MG PO TABS
1.0000 mg | ORAL_TABLET | Freq: Every day | ORAL | 3 refills | Status: DC
  Filled 2022-06-19 – 2022-08-05 (×3): qty 90, 90d supply, fill #0
  Filled 2022-09-05 – 2022-11-07 (×2): qty 90, 90d supply, fill #1
  Filled 2023-02-01: qty 90, 90d supply, fill #2
  Filled 2023-04-21: qty 90, 90d supply, fill #3

## 2022-06-19 MED ORDER — ATORVASTATIN CALCIUM 80 MG PO TABS
80.0000 mg | ORAL_TABLET | Freq: Every day | ORAL | 3 refills | Status: DC
  Filled 2022-06-19 – 2022-06-24 (×3): qty 90, 90d supply, fill #0
  Filled 2022-09-05: qty 90, 90d supply, fill #1
  Filled 2022-12-18: qty 90, 90d supply, fill #2
  Filled 2023-03-21: qty 90, 90d supply, fill #3

## 2022-06-19 MED ORDER — METOPROLOL SUCCINATE ER 25 MG PO TB24
75.0000 mg | ORAL_TABLET | Freq: Every day | ORAL | 3 refills | Status: DC
  Filled 2022-06-19 – 2022-06-24 (×3): qty 270, 90d supply, fill #0
  Filled 2022-09-05 – 2022-09-22 (×2): qty 270, 90d supply, fill #1

## 2022-06-21 ENCOUNTER — Other Ambulatory Visit (HOSPITAL_COMMUNITY): Payer: Self-pay

## 2022-06-21 ENCOUNTER — Encounter: Payer: Self-pay | Admitting: Hematology

## 2022-06-21 ENCOUNTER — Other Ambulatory Visit: Payer: Self-pay

## 2022-06-24 ENCOUNTER — Inpatient Hospital Stay: Payer: HMO

## 2022-06-24 ENCOUNTER — Other Ambulatory Visit: Payer: Self-pay

## 2022-06-24 ENCOUNTER — Inpatient Hospital Stay: Payer: HMO | Admitting: Hematology

## 2022-06-24 ENCOUNTER — Other Ambulatory Visit (HOSPITAL_COMMUNITY): Payer: Self-pay

## 2022-07-01 ENCOUNTER — Other Ambulatory Visit (HOSPITAL_COMMUNITY): Payer: Self-pay

## 2022-07-02 ENCOUNTER — Other Ambulatory Visit (HOSPITAL_COMMUNITY): Payer: Self-pay

## 2022-07-08 ENCOUNTER — Other Ambulatory Visit: Payer: HMO

## 2022-07-08 ENCOUNTER — Other Ambulatory Visit: Payer: Self-pay

## 2022-07-08 ENCOUNTER — Other Ambulatory Visit: Payer: Self-pay | Admitting: Thoracic Surgery (Cardiothoracic Vascular Surgery)

## 2022-07-08 ENCOUNTER — Ambulatory Visit: Payer: HMO | Admitting: Hematology

## 2022-07-08 ENCOUNTER — Ambulatory Visit: Payer: HMO

## 2022-07-08 DIAGNOSIS — C3490 Malignant neoplasm of unspecified part of unspecified bronchus or lung: Secondary | ICD-10-CM

## 2022-07-09 ENCOUNTER — Ambulatory Visit
Admission: RE | Admit: 2022-07-09 | Discharge: 2022-07-09 | Disposition: A | Discharge status: Home / Self Care | Payer: HMO | Source: Ambulatory Visit | Attending: Thoracic Surgery (Cardiothoracic Vascular Surgery) | Admitting: Thoracic Surgery (Cardiothoracic Vascular Surgery)

## 2022-07-09 ENCOUNTER — Encounter: Payer: Self-pay | Admitting: Thoracic Surgery (Cardiothoracic Vascular Surgery)

## 2022-07-09 ENCOUNTER — Other Ambulatory Visit (HOSPITAL_COMMUNITY): Payer: Self-pay

## 2022-07-09 ENCOUNTER — Ambulatory Visit (INDEPENDENT_AMBULATORY_CARE_PROVIDER_SITE_OTHER): Payer: HMO | Admitting: Thoracic Surgery (Cardiothoracic Vascular Surgery)

## 2022-07-09 VITALS — BP 101/62 | HR 61 | Resp 20 | Ht 70.0 in | Wt 150.2 lb

## 2022-07-09 DIAGNOSIS — C3490 Malignant neoplasm of unspecified part of unspecified bronchus or lung: Secondary | ICD-10-CM

## 2022-07-09 DIAGNOSIS — Z85118 Personal history of other malignant neoplasm of bronchus and lung: Secondary | ICD-10-CM

## 2022-07-09 NOTE — Progress Notes (Signed)
[facility] [address] [place] [address]    Clinic Day:  07/10/2022  Referring physician: [name], DO  Patient Care Team: [name], DO as PCP - General (Family Medicine) [name], MD as PCP - Cardiology (Cardiology) [name], MD (Cardiology) [name], MD as Consulting Physician (Gastroenterology) [name], RN as Oncology Nurse Navigator (Medical Oncology) [name], MD as Medical Oncologist (Medical Oncology) [name], MD as Consulting Physician (Hematology)   ASSESSMENT & PLAN:   Assessment: 1. 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  Occasional additional small pulmonary nodules measuring 0.4 cm and smaller. - Bronchoscopy (06/11/2021): RUL nodule brushing and FNA: Malignant cells with features of adenocarcinoma. - PET scan (06/22/2021): Subsolid nodular lesion in the right upper lobe, hypermetabolic SUV 15.  Small right hilar and infrahilar lymph nodes with SUV 4.03 concerning for metastatic adenopathy.  Small hypermetabolic left parotid gland lesion, consistent with previous history of Wharton's tumor.  Hypermetabolic focus in the transverse colon SUV 8.69. - MRI of the brain (07/11/2021): No evidence of metastatic disease.  Right sphenoid wing meningioma stable since 2022. - He was evaluated by Dr. Dorris Fetch and was recommended to undergo neoadjuvant chemoimmunotherapy followed by surgical resection. - 3 cycles of carboplatin, pemetrexed from 08/06/2021 through 10/02/2021 (opdivo given with only cycle 2, discontinued for cycle 3 due to cardiac problems) - NGS testing not performed due to insufficient sample. - Guardant360: Negative for EGFR and ALK.  MSI high not detected. - Right upper lobectomy and lymph node dissection (11/12/2021): 1.2 cm invasive adenocarcinoma,  moderately differentiated, margins negative, pT1b pN0, 0/17 lymph nodes positive from stations 4R, 7, 8, 9, 10R, 11 R and 12 R.  Negative for visceral pleural involvement, LVI.  PD-L1 TPS is 1%. - Adjuvant pembrolizumab started on 03/11/2022.  Pembrolizumab held after 3 cycles due to fall and right fracture, s/p right hemiarthroplasty.   2. Social/family history: - Lives at home with his wife.  Uses cane occasionally after he had stroke in January 2022.  He has retired after working in [employer].  He is current active smoker, 1 pack/day for 60 years.  No exposure to chemicals. - Brother died of metastatic cancer.  Maternal uncle had lung cancer.    Plan: 1. 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 - Reviewed labs: Normal LFTs with albumin 3.1.  CBC grossly normal. - He will proceed with Keytruda today. - Previous CT chest on 04/19/2022 showed small pulmonary nodules in the lungs bilaterally, new compared to prior study.  We will obtain another CT scan of the chest prior to next visit in 3 weeks.  2.  Normocytic anemia: - He received Feraheme on 04/26/2022 and 06/13/2022.  Hemoglobin today is 9.9.  Will check his iron panel in the next few weeks.   3.  Hypertension/A-fib: - At last visit I decreased metoprolol from 75 mg daily to 50 mg daily.  Blood pressure today is 99/58 and heart rate is 63. - He was told to decrease his metoprolol to 25 mg daily if systolic blood pressure at home is staying below 100.   4.  Depression: - Continue Lexapro 10 mg daily.  Mood has improved.    Orders Placed This Encounter  Procedures   CT Chest  W Contrast    Standing Status:   Future    Standing Expiration Date:   07/10/2023    Order Specific Question:   If indicated for the ordered procedure, I authorize the administration of contrast media per Radiology protocol    Answer:   Yes    Order Specific Question:   Does the patient have a  contrast media/X-ray dye allergy?    Answer:   No    Order Specific Question:   Preferred imaging location?    Answer:   [facility]      I,[name],acting as a scribe for [name], MD.,have documented all relevant documentation on the behalf of [name], MD,as directed by  [name], MD while in the presence of [name], MD.   I, [name] MD, have reviewed the above documentation for accuracy and completeness, and I agree with the above.   [name], MD   6/19/20245:02 PM  CHIEF COMPLAINT:   Diagnosis: right upper lobe lung adenocarcinoma    Cancer Staging  Primary adenocarcinoma of upper lobe of right lung [facility]) Staging form: Lung, AJCC 8th Edition - Clinical stage from 06/26/2021: cT1, cN1, cM0 - Unsigned    Prior Therapy: 1. 3 cycles Neoadjuvant chemoimmunotherapy with carboplatin, pemetrexed from 08/06/21 through 10/02/21 2. RUL lobectomy and LND on 10/23/203  Current Therapy:  Adjuvant pembrolizumab    HISTORY OF PRESENT ILLNESS:   Oncology History  Primary adenocarcinoma of upper lobe of right lung (HCC)  06/26/2021 Initial Diagnosis   Primary adenocarcinoma of upper lobe of right lung (HCC)   08/06/2021 - 08/27/2021 Chemotherapy   Patient is on Treatment Plan : LUNG NSCLC Pemetrexed + Carboplatin q21d x 4 Cycles     08/06/2021 - 10/02/2021 Chemotherapy   Patient is on Treatment Plan : LUNG NSCLC Pemetrexed + Carboplatin q21d x 4 Cycles     03/11/2022 -  Chemotherapy   Patient is on Treatment Plan : LUNG NSCLC Pembrolizumab (200) q21d        INTERVAL HISTORY:   Charming is a 77 y.o. male presenting to clinic today for follow up of right upper lobe lung adenocarcinoma. He was last seen by me on 06/13/22.  Today, he states that he is doing well overall. His appetite level is at 90%. His energy level is at 40%.  PAST MEDICAL HISTORY:   Past Medical History: Past Medical History:   Diagnosis Date   AAA (abdominal aortic aneurysm) (HCC)    Arthritis    Cancer (HCC)    Carotid artery disease (HCC)    Nonobstructive   Cataract    COPD (chronic obstructive pulmonary disease) (HCC)    Coronary atherosclerosis of native coronary artery    PTCA small diagonal 2007 otherwise nonobstructive CAD   Depression    Dysrhythmia    Essential hypertension, benign    Hyperlipidemia    NSTEMI (non-ST elevated myocardial infarction) (HCC) 2007   Stroke [facility]) 2004   TIA (transient ischemic attack) 2006    Surgical History: Past Surgical History:  Procedure Laterality Date   AORTA - BILATERAL FEMORAL ARTERY BYPASS GRAFT  01/08/2012   Procedure: AORTA BIFEMORAL BYPASS GRAFT;  Surgeon: Sherren Kerns, MD;  Location: [facility] OR;  Service: Vascular;  Laterality: Bilateral;  using 18x22mm x 40cm Hemashield Gold Vascular Graft with Endarterectomy, Thombectomy and  Reimplantation of Inferior Mesenteric Artery   BACK SURGERY  2021   BRONCHIAL BIOPSY  06/11/2021   Procedure: BRONCHIAL BIOPSIES;  Surgeon: Levy Pupa  S, MD;  Location: [facility] ENDOSCOPY;  Service: Pulmonary;;   BRONCHIAL BRUSHINGS  06/11/2021   Procedure: BRONCHIAL BRUSHINGS;  Surgeon: Leslye Peer, MD;  Location[facility] ENDOSCOPY;  Service: Pulmonary;;   BRONCHIAL NEEDLE ASPIRATION BIOPSY  06/11/2021   Procedure: BRONCHIAL NEEDLE ASPIRATION BIOPSIES;  Surgeon: Leslye Peer, MD;  Location: [facility] ENDOSCOPY;  Service: Pulmonary;;   CARDIOVERSION N/A 09/20/2021   Procedure: CARDIOVERSION;  Surgeon: [name], MD;  Location: [facility] ORS;  Service: Cardiovascular;  Laterality: N/A;   COLONOSCOPY N/A 04/14/2019   Procedure: COLONOSCOPY;  Surgeon: Corbin Ade, MD;  Location: [facility] ENDO SUITE;  Service: Endoscopy;  Laterality: N/A;  9:30   COLONOSCOPY WITH PROPOFOL N/A 07/25/2021   Procedure: COLONOSCOPY WITH PROPOFOL;  Surgeon: Lanelle Bal, DO;  Location: [facility] ENDO SUITE;  Service: Endoscopy;  Laterality: N/A;  1:00pm    FIDUCIAL MARKER PLACEMENT  06/11/2021   Procedure: FIDUCIAL MARKER PLACEMENT;  Surgeon: Leslye Peer, MD;  Location: [facility] ENDOSCOPY;  Service: Pulmonary;;   HIP ARTHROPLASTY Right 04/28/2022   Procedure: ARTHROPLASTY BIPOLAR HIP (HEMIARTHROPLASTY);  Surgeon: Sheral Apley, MD;  Location: [facility] OR;  Service: Orthopedics;  Laterality: Right;   INTERCOSTAL NERVE BLOCK Right 11/12/2021   Procedure: INTERCOSTAL NERVE BLOCK;  Surgeon: Loreli Slot, MD;  Location: [facility] OR;  Service: Thoracic;  Laterality: Right;   IR IMAGING GUIDED PORT INSERTION  07/31/2021   Left cataract surgery     LYMPH NODE DISSECTION Right 11/12/2021   Procedure: LYMPH NODE DISSECTION;  Surgeon: Loreli Slot, MD;  Location: [facility] OR;  Service: Thoracic;  Laterality: Right;   POLYPECTOMY  04/14/2019   Procedure: POLYPECTOMY;  Surgeon: Corbin Ade, MD;  Location: [facility] ENDO SUITE;  Service: Endoscopy;;   POLYPECTOMY  07/25/2021   Procedure: POLYPECTOMY;  Surgeon: Lanelle Bal, DO;  Location: [facility] ENDO SUITE;  Service: Endoscopy;;   TEE WITHOUT CARDIOVERSION N/A 09/20/2021   Procedure: TRANSESOPHAGEAL ECHOCARDIOGRAM (TEE);  Surgeon: [name], MD;  Location: [facility] ORS;  Service: Cardiovascular;  Laterality: N/A;   TRANSFORAMINAL LUMBAR INTERBODY FUSION (TLIF) WITH PEDICLE SCREW FIXATION 1 LEVEL N/A 04/27/2020   Procedure: Transforaminal Lumbar Interbody Fusion Lumbar Five-Sacral One;  Surgeon: Bedelia Person, MD;  Location: [facility] OR;  Service: Neurosurgery;  Laterality: N/A;   VIDEO BRONCHOSCOPY WITH INSERTION OF INTERBRONCHIAL VALVE (IBV) N/A 11/22/2021   Procedure: VIDEO BRONCHOSCOPY WITH INSERTION OF INTERBRONCHIAL VALVE (IBV);  Surgeon: Loreli Slot, MD;  Location: [facility] OR;  Service: Thoracic;  Laterality: N/A;   VIDEO BRONCHOSCOPY WITH INSERTION OF INTERBRONCHIAL VALVE (IBV) N/A 01/24/2022   Procedure: VIDEO BRONCHOSCOPY WITH REMOVAL OF INTERBRONCHIAL VALVE (IBV);  Surgeon: Loreli Slot,  MD;  Location: [facility] OR;  Service: Thoracic;  Laterality: N/A;   VIDEO BRONCHOSCOPY WITH RADIAL ENDOBRONCHIAL ULTRASOUND  06/11/2021   Procedure: VIDEO BRONCHOSCOPY WITH RADIAL ENDOBRONCHIAL ULTRASOUND;  Surgeon: Leslye Peer, MD;  Location: [facility] ENDOSCOPY;  Service: Pulmonary;;    Social History: Social History   Socioeconomic History   Marital status: Divorced    Spouse name: Not on file   Number of children: 1   Years of education: 11   Highest education level: 11th grade  Occupational History    Employer: Engineer, materials  Tobacco Use   Smoking status: Former    Packs/day: 1.00    Years: 40.00    Additional pack years: 0.00    Total pack years: 40.00    Types: Cigarettes    Quit date: 07/2021  Years since quitting: 0.9   Smokeless tobacco: Never   Tobacco comments:    1 pack of cigarettes smoked daily. 07/17/21 ARJ, RN   Vaping Use   Vaping Use: Never used  Substance and Sexual Activity   Alcohol use: No    Comment: Prior history of regular alcohol use   Drug use: No   Sexual activity: Not Currently  Other Topics Concern   Not on file  Social History Narrative   Not on file   Social Determinants of Health   Financial Resource Strain: High Risk (06/11/2022)   Overall Financial Resource Strain (CARDIA)    Difficulty of Paying Living Expenses: Hard  Food Insecurity: Patient Declined (06/11/2022)   Hunger Vital Sign    Worried About Running Out of Food in the Last Year: Patient declined    Ran Out of Food in the Last Year: Patient declined  Transportation Needs: Unmet Transportation Needs (06/11/2022)   PRAPARE - Administrator, Civil Service (Medical): Yes    Lack of Transportation (Non-Medical): Yes  Physical Activity: Unknown (06/11/2022)   Exercise Vital Sign    Days of Exercise per Week: 0 days    Minutes of Exercise per Session: Not on file  Stress: No Stress Concern Present (06/11/2022)   [employer] of Occupational Health - Occupational  Stress Questionnaire    Feeling of Stress : Not at all  Social Connections: Socially Isolated (06/11/2022)   Social Connection and Isolation Panel [NHANES]    Frequency of Communication with Friends and Family: Once a week    Frequency of Social Gatherings with Friends and Family: Never    Attends Religious Services: Never    Database administrator or Organizations: No    Attends Engineer, structural: Not on file    Marital Status: Divorced  Intimate Partner Violence: Not At Risk (05/03/2022)   Humiliation, Afraid, Rape, and Kick questionnaire    Fear of Current or Ex-Partner: No    Emotionally Abused: No    Physically Abused: No    Sexually Abused: No    Family History: Family History  Problem Relation Age of Onset   Hyperlipidemia Sister    Heart attack Brother 61   Cancer - Colon Neg Hx     Current Medications:  Current Outpatient Medications:    acetaminophen (TYLENOL) 500 MG tablet, GIVE 2 TABLETS (1,000 mg TOTAL) BY MOUTH EVERY 6 HOURS AS NEEDED FOR PAIN, Disp: 60 tablet, Rfl: 0   albuterol (VENTOLIN HFA) 108 (90 Base) MCG/ACT inhaler, Inhale 2 puffs into the lungs every 6 (six) hours as needed for wheezing or shortness of breath., Disp: 6.7 g, Rfl: 6   apixaban (ELIQUIS) 5 MG TABS tablet, Take 1 tablet (5 mg total) by mouth 2 (two) times daily., Disp: 180 tablet, Rfl: 3   atorvastatin (LIPITOR) 80 MG tablet, Take 1 tablet (80 mg total) by mouth daily., Disp: 90 tablet, Rfl: 3   cyclobenzaprine (FLEXERIL) 10 MG tablet, Take 1 tablet (10 mg total) by mouth 2 (two) times daily as needed., Disp: 60 tablet, Rfl: 2   escitalopram (LEXAPRO) 10 MG tablet, Take 1 tablet (10 mg total) by mouth daily., Disp: 90 tablet, Rfl: 3   folic acid (KP FOLIC ACID) 1 MG tablet, Take 1 tablet (1 mg total) by mouth daily., Disp: 90 tablet, Rfl: 3   furosemide (LASIX) 20 MG tablet, Take 1 tablet (20 mg total) by mouth daily as needed., Disp: 90 tablet, Rfl: 2  HYDROcodone-acetaminophen  (NORCO/VICODIN) 5-325 MG tablet, Take 1 tablet by mouth every 6 (six) hours as needed, for pain., Disp: 60 tablet, Rfl: 0   magnesium oxide (MAG-OX) 400 (240 Mg) MG tablet, Take 1 tablet (400 mg total) by mouth daily., Disp: 120 tablet, Rfl: 3   metoprolol succinate (TOPROL-XL) 25 MG 24 hr tablet, Take 3 tablets (75 mg total) by mouth daily. Take with or immediately following a meal. Do Not Crush. (Patient taking differently: Take 50 mg by mouth daily.), Disp: 270 tablet, Rfl: 3   pantoprazole (PROTONIX) 40 MG tablet, Take 1 tablet (40 mg total) by mouth daily., Disp: 90 tablet, Rfl: 3   tamsulosin (FLOMAX) 0.4 MG CAPS capsule, Take 1 capsule (0.4 mg total) by mouth daily., Disp: 90 capsule, Rfl: 3   Tiotropium Bromide Monohydrate (SPIRIVA RESPIMAT) 2.5 MCG/ACT AERS, Inhale 2 puffs into the lungs daily., Disp: 18 g, Rfl: 3 No current facility-administered medications for this visit.  Facility-Administered Medications Ordered in Other Visits:    sodium chloride flush (NS) 0.9 % injection 10 mL, 10 mL, Intracatheter, PRN, [name], MD, 10 mL at 07/10/22 1513   Allergies: No Known Allergies  REVIEW OF SYSTEMS:   Review of Systems  Constitutional:  Negative for chills, fatigue and fever.  HENT:   Negative for lump/mass, mouth sores, nosebleeds, sore throat and trouble swallowing.   Eyes:  Negative for eye problems.  Respiratory:  Negative for cough and shortness of breath.   Cardiovascular:  Negative for chest pain, leg swelling and palpitations.  Gastrointestinal:  Negative for abdominal pain, constipation, diarrhea, nausea and vomiting.  Genitourinary:  Negative for bladder incontinence, difficulty urinating, dysuria, frequency, hematuria and nocturia.   Musculoskeletal:  Positive for arthralgias. Negative for back pain, flank pain, myalgias and neck pain.  Skin:  Negative for itching and rash.  Neurological:  Positive for headaches. Negative for dizziness and numbness.   Hematological:  Does not bruise/bleed easily.  Psychiatric/Behavioral:  Negative for depression, sleep disturbance and suicidal ideas. The patient is not nervous/anxious.   All other systems reviewed and are negative.    VITALS:   There were no vitals taken for this visit.  Wt Readings from Last 3 Encounters:  07/10/22 151 lb 9.6 oz (68.8 kg)  07/09/22 150 lb 3.2 oz (68.1 kg)  06/13/22 153 lb 12.8 oz (69.8 kg)    There is no height or weight on file to calculate BMI.  Performance status (ECOG): 1 - Symptomatic but completely ambulatory  PHYSICAL EXAM:   Physical Exam Vitals and nursing note reviewed. Exam conducted with a chaperone present.  Constitutional:      Appearance: Normal appearance.  Cardiovascular:     Rate and Rhythm: Normal rate and regular rhythm.     Pulses: Normal pulses.     Heart sounds: Normal heart sounds.  Pulmonary:     Effort: Pulmonary effort is normal.     Breath sounds: Normal breath sounds.  Abdominal:     Palpations: Abdomen is soft. There is no hepatomegaly, splenomegaly or mass.     Tenderness: There is no abdominal tenderness.  Musculoskeletal:     Right lower leg: No edema.     Left lower leg: No edema.  Lymphadenopathy:     Cervical: No cervical adenopathy.     Right cervical: No superficial, deep or posterior cervical adenopathy.    Left cervical: No superficial, deep or posterior cervical adenopathy.     Upper Body:     Right upper body:  No supraclavicular or axillary adenopathy.     Left upper body: No supraclavicular or axillary adenopathy.  Neurological:     General: No focal deficit present.     Mental Status: He is alert and oriented to person, place, and time.  Psychiatric:        Mood and Affect: Mood normal.        Behavior: Behavior normal.     LABS:      Latest Ref Rng & Units 07/10/2022   11:48 AM 06/13/2022    8:13 AM 06/12/2022    4:20 PM  CBC  WBC 4.0 - 10.5 K/uL 6.3  6.4  6.7   Hemoglobin 13.0 - 17.0 g/dL  9.9  9.7  09.8   Hematocrit 39.0 - 52.0 % 30.6  29.4  29.8   Platelets 150 - 400 K/uL 120  125  135       Latest Ref Rng & Units 07/10/2022   11:48 AM 06/13/2022    8:13 AM 06/12/2022    4:20 PM  CMP  Glucose 70 - 99 mg/dL 119  147  829   BUN 8 - 23 mg/dL 14  14  15    Creatinine 0.61 - 1.24 mg/dL 5.62  1.30  8.65   Sodium 135 - 145 mmol/L 134  138  140   Potassium 3.5 - 5.1 mmol/L 3.6  3.7  4.7   Chloride 98 - 111 mmol/L 103  103  102   CO2 22 - 32 mmol/L 25  25  23    Calcium 8.9 - 10.3 mg/dL 8.6  9.0  9.4   Total Protein 6.5 - 8.1 g/dL 6.2  6.3  6.4   Total Bilirubin 0.3 - 1.2 mg/dL 0.8  0.6  0.6   Alkaline Phos 38 - 126 U/L 52  63  76   AST 15 - 41 U/L 22  26  23    ALT 0 - 44 U/L 15  17  13       No results found for: "CEA1", "CEA" / No results found for: "CEA1", "CEA" Lab Results  Component Value Date   PSA1 1.2 11/25/2019   No results found for: "HQI696" No results found for: "CAN125"  No results found for: "TOTALPROTELP", "ALBUMINELP", "A1GS", "A2GS", "BETS", "BETA2SER", "GAMS", "MSPIKE", "SPEI" Lab Results  Component Value Date   TIBC 253 06/13/2022   TIBC 291 04/01/2022   TIBC 297 03/11/2022   FERRITIN 501 (H) 06/13/2022   FERRITIN 86 04/01/2022   FERRITIN 117 03/11/2022   IRONPCTSAT 24 06/13/2022   IRONPCTSAT 22 04/01/2022   IRONPCTSAT 24 03/11/2022   No results found for: "LDH"   STUDIES:   DG Chest 2 View  Result Date: 07/09/2022 CLINICAL DATA:  Lung carcinoma EXAM: CHEST - 2 VIEW COMPARISON:  Previous chest radiographs including the study done on 02/05/2022, CT chest done on 04/19/2022 FINDINGS: There is previous partial removal of right lung. There is pleural density in the right apex, possibly loculated effusion or pleural thickening. There is interval clearing of loculated air in right apex since the previous studies. There are no signs of alveolar pulmonary edema or focal pulmonary consolidation. Costophrenic angles are clear. There is no pneumothorax.  Tip of chest port is seen in superior vena cava. IMPRESSION: There are no signs of pulmonary edema or focal pulmonary consolidation. There is partial resection of right lung. There is right apical opacity which may be suggestive of loculated pleural effusion or scarring. There is interval clearing of loculated pocket  of air seen in the right apex since the previous studies. Electronically Signed   By: Ernie Avena M.D.   On: 07/09/2022 11:13

## 2022-07-09 NOTE — Progress Notes (Signed)
[address]       [place] [address]             [phone]     HPI: Mr. 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  Underwent neoadjuvant chemoimmunotherapy followed by a robotic assisted right upper lobectomy in October 2023.  Posttreatment pathologic stage was 1A.  He had a prolonged air leak postoperatively requiring IBV placement.  Those were removed in January.  I last saw him in January.  He was doing well at that time.  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  In the interim since his last visit he has been on immunotherapy with pembrolizumab.  In May he fell and broke his hip and required surgery.  He has missed a couple of immunotherapy treatments in the interim.  No recent respiratory issues.  No pain related to his surgery.  Past Medical History:  Diagnosis Date   AAA (abdominal aortic aneurysm) (HCC)    Arthritis    Cancer (HCC)    Carotid artery disease (HCC)    Nonobstructive   Cataract    COPD (chronic obstructive pulmonary disease) (HCC)    Coronary atherosclerosis of native coronary artery    PTCA small diagonal 2007 otherwise nonobstructive CAD   Depression    Dysrhythmia    Essential hypertension, benign    Hyperlipidemia    NSTEMI (non-ST elevated myocardial infarction) (HCC) 2007   Stroke [facility]) 2004   TIA (transient ischemic attack) 2006    Current Outpatient Medications  Medication Sig Dispense Refill   acetaminophen (TYLENOL) 500 MG tablet GIVE 2 TABLETS  (1,000 mg TOTAL) BY MOUTH EVERY 6 HOURS AS NEEDED FOR PAIN 60 tablet 0   albuterol (VENTOLIN HFA) 108 (90 Base) MCG/ACT inhaler Inhale 2 puffs into the lungs every 6 (six) hours as needed for wheezing or shortness of breath. 6.7 g 6   apixaban (ELIQUIS) 5 MG TABS tablet Take 1 tablet (5 mg total) by mouth 2 (two) times daily. 180 tablet 3   atorvastatin (LIPITOR) 80 MG tablet Take 1 tablet (80 mg total) by mouth daily. 90 tablet 3   cyclobenzaprine (FLEXERIL) 10 MG tablet Take 1 tablet (10 mg total) by mouth 2 (two) times daily as needed. 60 tablet 2   escitalopram (LEXAPRO) 10 MG tablet Take 1 tablet (10 mg total) by mouth daily. 90 tablet 3   folic acid (KP FOLIC ACID) 1 MG tablet Take 1 tablet (1 mg total) by mouth daily. 90 tablet 3   furosemide (LASIX) 20 MG tablet Take 1 tablet (20 mg total) by mouth daily as needed. 90 tablet 2   HYDROcodone-acetaminophen (NORCO/VICODIN) 5-325 MG tablet Take 1 tablet by mouth every 6 (six) hours as needed, for pain. 60 tablet 0   magnesium oxide (MAG-OX) 400 (240 Mg) MG tablet Take 1 tablet (400 mg total) by mouth daily. 120 tablet 3   metoprolol succinate (TOPROL-XL) 25 MG 24 hr tablet Take 3 tablets (75 mg total) by mouth daily. Take with or immediately following a meal. Do Not  Crush. (Patient taking differently: Take 50 mg by mouth daily.) 270 tablet 3   pantoprazole (PROTONIX) 40 MG tablet Take 1 tablet (40 mg total) by mouth daily. 90 tablet 3   tamsulosin (FLOMAX) 0.4 MG CAPS capsule Take 1 capsule (0.4 mg total) by mouth daily. 90 capsule 3   Tiotropium Bromide Monohydrate (SPIRIVA RESPIMAT) 2.5 MCG/ACT AERS Inhale 2 puffs into the lungs daily. 18 g 3   No current facility-administered medications for this visit.    Physical Exam BP 101/62 (BP Location: Right Arm, Patient Position: Sitting, Cuff Size: Normal)   Pulse 61   Resp 20   Ht 5\' 10"  (1.778 m)   Wt 150 lb 3.2 oz (68.1 kg)   SpO2 100% Comment: RA  BMI 21.71 kg/m  77 year old man in no  acute distress Alert and oriented x 3 with no focal deficits Lungs diminished at right base but otherwise clear Incisions well-healed No cervical or supraclavicular adenopathy Cardiac regular rate and rhythm  Diagnostic Tests: I personally reviewed his chest x-ray images.  Post right upper lobectomy.  No suspicious findings.  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  Stage IIb adenocarcinoma right upper lobe-treated with neoadjuvant chemoimmunotherapy with downstaging to posttreatment stage Ia.  Underwent right upper lobectomy about 8 months ago.  Currently on adjuvant immunotherapy with pembrolizumab.  Recent hip fracture-required surgery.  Has interrupted his adjuvant immunotherapy.  Hopefully can resume that tomorrow.  Tobacco abuse-quit smoking about a year ago.  Plan: Follow-up with Dr. Ellin Saba I will be happy to see Mr. Lerro back at anytime in the future if I can be of any further assistance with his care  Loreli Slot, MD [facility] [phone]

## 2022-07-10 ENCOUNTER — Other Ambulatory Visit (HOSPITAL_COMMUNITY): Payer: Self-pay

## 2022-07-10 ENCOUNTER — Telehealth: Payer: Self-pay

## 2022-07-10 ENCOUNTER — Inpatient Hospital Stay: Payer: HMO | Attending: Hematology

## 2022-07-10 ENCOUNTER — Inpatient Hospital Stay (HOSPITAL_BASED_OUTPATIENT_CLINIC_OR_DEPARTMENT_OTHER): Payer: HMO | Admitting: Hematology

## 2022-07-10 ENCOUNTER — Inpatient Hospital Stay: Payer: HMO

## 2022-07-10 VITALS — BP 103/58 | HR 50 | Temp 96.1°F | Resp 16

## 2022-07-10 DIAGNOSIS — C3411 Malignant neoplasm of upper lobe, right bronchus or lung: Secondary | ICD-10-CM

## 2022-07-10 DIAGNOSIS — D649 Anemia, unspecified: Secondary | ICD-10-CM | POA: Insufficient documentation

## 2022-07-10 DIAGNOSIS — Z5112 Encounter for antineoplastic immunotherapy: Secondary | ICD-10-CM | POA: Insufficient documentation

## 2022-07-10 DIAGNOSIS — Z87891 Personal history of nicotine dependence: Secondary | ICD-10-CM | POA: Insufficient documentation

## 2022-07-10 LAB — CBC WITH DIFFERENTIAL/PLATELET
Abs Immature Granulocytes: 0.04 10*3/uL (ref 0.00–0.07)
Basophils Absolute: 0 10*3/uL (ref 0.0–0.1)
Basophils Relative: 1 %
Eosinophils Absolute: 0.1 10*3/uL (ref 0.0–0.5)
Eosinophils Relative: 2 %
HCT: 30.6 % — ABNORMAL LOW (ref 39.0–52.0)
Hemoglobin: 9.9 g/dL — ABNORMAL LOW (ref 13.0–17.0)
Immature Granulocytes: 1 %
Lymphocytes Relative: 19 %
Lymphs Abs: 1.2 10*3/uL (ref 0.7–4.0)
MCH: 31.8 pg (ref 26.0–34.0)
MCHC: 32.4 g/dL (ref 30.0–36.0)
MCV: 98.4 fL (ref 80.0–100.0)
Monocytes Absolute: 0.4 10*3/uL (ref 0.1–1.0)
Monocytes Relative: 6 %
Neutro Abs: 4.5 10*3/uL (ref 1.7–7.7)
Neutrophils Relative %: 71 %
Platelets: 120 10*3/uL — ABNORMAL LOW (ref 150–400)
RBC: 3.11 MIL/uL — ABNORMAL LOW (ref 4.22–5.81)
RDW: 14.4 % (ref 11.5–15.5)
WBC: 6.3 10*3/uL (ref 4.0–10.5)
nRBC: 0 % (ref 0.0–0.2)

## 2022-07-10 LAB — COMPREHENSIVE METABOLIC PANEL
ALT: 15 U/L (ref 0–44)
AST: 22 U/L (ref 15–41)
Albumin: 3.1 g/dL — ABNORMAL LOW (ref 3.5–5.0)
Alkaline Phosphatase: 52 U/L (ref 38–126)
Anion gap: 6 (ref 5–15)
BUN: 14 mg/dL (ref 8–23)
CO2: 25 mmol/L (ref 22–32)
Calcium: 8.6 mg/dL — ABNORMAL LOW (ref 8.9–10.3)
Chloride: 103 mmol/L (ref 98–111)
Creatinine, Ser: 0.95 mg/dL (ref 0.61–1.24)
GFR, Estimated: >60 mL/min (ref >60)
Glucose, Bld: 118 mg/dL — ABNORMAL HIGH (ref 70–99)
Potassium: 3.6 mmol/L (ref 3.5–5.1)
Sodium: 134 mmol/L — ABNORMAL LOW (ref 135–145)
Total Bilirubin: 0.8 mg/dL (ref 0.3–1.2)
Total Protein: 6.2 g/dL — ABNORMAL LOW (ref 6.5–8.1)

## 2022-07-10 LAB — MAGNESIUM: Magnesium: 1.7 mg/dL (ref 1.7–2.4)

## 2022-07-10 MED ORDER — SODIUM CHLORIDE 0.9 % IV SOLN: Freq: Once | INTRAVENOUS | Status: AC

## 2022-07-10 MED ORDER — HEPARIN SOD (PORK) LOCK FLUSH 100 UNIT/ML IV SOLN
500.0000 [IU] | Freq: Once | INTRAVENOUS | Status: AC | PRN
  Administered 2022-07-10: 500 [IU]

## 2022-07-10 MED ORDER — SODIUM CHLORIDE 0.9% FLUSH
10.0000 mL | Freq: Once | INTRAVENOUS | Status: AC
  Administered 2022-07-10: 10 mL via INTRAVENOUS

## 2022-07-10 MED ORDER — SODIUM CHLORIDE 0.9 % IV SOLN
200.0000 mg | Freq: Once | INTRAVENOUS | Status: AC
  Administered 2022-07-10: 200 mg via INTRAVENOUS
  Filled 2022-07-10: qty 8

## 2022-07-10 MED ORDER — SODIUM CHLORIDE 0.9% FLUSH
10.0000 mL | INTRAVENOUS | Status: DC | PRN
  Administered 2022-07-10: 10 mL

## 2022-07-10 NOTE — Addendum Note (Signed)
Addended by: Toniann Fail B on: 07/10/2022 01:29 PM   Modules accepted: Orders

## 2022-07-10 NOTE — Patient Instructions (Signed)
[facility] [place] [place] [facility]  Discharge Instructions: Thank you for choosing [facility] to provide your oncology and hematology care.  If you have a lab appointment with the Cancer Center - please note that after April 8th, 2024, all labs will be drawn in the cancer center.  You do not have to check in or register with the main entrance as you have in the past but will complete your check-in in the cancer center.  Wear comfortable clothing and clothing appropriate for easy access to any Portacath or PICC line.   We strive to give you quality time with your provider. You may need to reschedule your appointment if you arrive late (15 or more minutes).  Arriving late affects you and other patients whose appointments are after yours.  Also, if you miss three or more appointments without notifying the office, you may be dismissed from the clinic at the provider's discretion.      For prescription refill requests, have your pharmacy contact our office and allow 72 hours for refills to be completed.    Today you received the following chemotherapy and/or immunotherapy agents keytruda   To help prevent nausea and vomiting after your treatment, we encourage you to take your nausea medication as directed.  BELOW ARE SYMPTOMS THAT SHOULD BE REPORTED IMMEDIATELY: *FEVER GREATER THAN 100.4 F (38 C) OR HIGHER *CHILLS OR SWEATING *NAUSEA AND VOMITING THAT IS NOT CONTROLLED WITH YOUR NAUSEA MEDICATION *UNUSUAL SHORTNESS OF BREATH *UNUSUAL BRUISING OR BLEEDING *URINARY PROBLEMS (pain or burning when urinating, or frequent urination) *BOWEL PROBLEMS (unusual diarrhea, constipation, pain near the anus) TENDERNESS IN MOUTH AND THROAT WITH OR WITHOUT PRESENCE OF ULCERS (sore throat, sores in mouth, or a toothache) UNUSUAL RASH, SWELLING OR PAIN  UNUSUAL VAGINAL DISCHARGE OR ITCHING   Items with * indicate a potential emergency and should be followed up as soon as possible or go to the  Emergency Department if any problems should occur.  Please show the CHEMOTHERAPY ALERT CARD or IMMUNOTHERAPY ALERT CARD at check-in to the Emergency Department and triage nurse.  Should you have questions after your visit or need to cancel or reschedule your appointment, please contact [facility] [place] [phone]  and follow the prompts.  Office hours are 8:00 a.m. to 4:30 p.m. Monday - Friday. Please note that voicemails left after 4:00 p.m. may not be returned until the following business day.  We are closed weekends and major holidays. You have access to a nurse at all times for urgent questions. Please call the main number to the clinic [phone] and follow the prompts.  For any non-urgent questions, you may also contact your provider using MyChart. We now offer e-Visits for anyone 18 and older to request care online for non-urgent symptoms. For details visit [email or web address].   Also download the MyChart app! Go to the app store, search "MyChart", open the app, select [place], and log in with your MyChart username and password.   

## 2022-07-10 NOTE — Progress Notes (Signed)
Patient tolerated chemotherapy with no complaints voiced.  Side effects with management reviewed with understanding verbalized.  Port site clean and dry with no bruising or swelling noted at site.  Good blood return noted before and after administration of chemotherapy.  Band aid applied.  Patient left in satisfactory condition with VSS and no s/s of distress noted.   

## 2022-07-10 NOTE — Telephone Encounter (Signed)
Called pt to confirm appointment for tomorrow due to note listed in chart. No answer lvmm for pt to call the office back. I also sent a message via mychart

## 2022-07-10 NOTE — Progress Notes (Signed)
Patient has been examined by Dr. Katragadda. Vital signs and labs have been reviewed by MD - ANC, Creatinine, LFTs, hemoglobin, and platelets are within treatment parameters per M.D. - pt may proceed with treatment.  Primary RN and pharmacy notified.  

## 2022-07-10 NOTE — Patient Instructions (Signed)
[facility] Discharge Instructions   You were seen and examined today by Dr. Katragadda.  He reviewed the results of your lab work which are normal/stable.   We will proceed with your treatment today.  Return as scheduled.    Thank you for choosing [facility] to provide your oncology and hematology care.  To afford each patient quality time with our provider, please arrive at least 15 minutes before your scheduled appointment time.   If you have a lab appointment with the Cancer Center please come in thru the Main Entrance and check in at the main information desk.  You need to re-schedule your appointment should you arrive 10 or more minutes late.  We strive to give you quality time with our providers, and arriving late affects you and other patients whose appointments are after yours.  Also, if you no show three or more times for appointments you may be dismissed from the clinic at the providers discretion.     Again, thank you for choosing [facility].  Our hope is that these requests will decrease the amount of time that you wait before being seen by our physicians.       _____________________________________________________________  Should you have questions after your visit to [facility], please contact our office at [phone] and follow the prompts.  Our office hours are 8:00 a.m. and 4:30 p.m. Monday - Friday.  Please note that voicemails left after 4:00 p.m. may not be returned until the following business day.  We are closed weekends and major holidays.  You do have access to a nurse 24-7, just call the main number to the clinic [phone] and do not press any options, hold on the line and a nurse will answer the phone.    For prescription refill requests, have your pharmacy contact our office and allow 72 hours.    Due to Covid, you will need to wear a mask upon entering  the hospital. If you do not have a mask, a mask will be given to you at the Main Entrance upon arrival. For doctor visits, patients may have 1 support person age 18 or older with them. For treatment visits, patients can not have anyone with them due to social distancing guidelines and our immunocompromised population.      

## 2022-07-11 ENCOUNTER — Encounter: Payer: Self-pay | Admitting: Nurse Practitioner

## 2022-07-11 ENCOUNTER — Ambulatory Visit (INDEPENDENT_AMBULATORY_CARE_PROVIDER_SITE_OTHER): Payer: HMO | Admitting: Nurse Practitioner

## 2022-07-11 ENCOUNTER — Other Ambulatory Visit (HOSPITAL_COMMUNITY): Payer: Self-pay

## 2022-07-11 VITALS — BP 90/54 | HR 65 | Ht 70.0 in | Wt 151.8 lb

## 2022-07-11 DIAGNOSIS — C3411 Malignant neoplasm of upper lobe, right bronchus or lung: Secondary | ICD-10-CM

## 2022-07-11 DIAGNOSIS — J449 Chronic obstructive pulmonary disease, unspecified: Secondary | ICD-10-CM

## 2022-07-11 NOTE — Progress Notes (Signed)
@Patient  ID: Darryl Ward, male    DOB: 12/11/1945, 77 y.o.   MRN: [id number]  Chief Complaint  Patient presents with   Follow-up    Pt states he has no c/o     Referring provider: Rebekah Chesterfield, NP  HPI: 77 year old male, former smoker followed for COPD. He is a patient of Dr. Kavin Leech and last seen in office 01/23/2022. 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 He is followed by Dr. Ellin Saba. Past medical history significant for CAD, AAA, PAF on Eliquis, HTN, CHF, BPH, HLD, IDA.   TEST/EVENTS:  07/03/2021 PFT: FVC 108, FEV1 100, ratio 67, TLC 102, DLCOunc 47. No BD 03/04/2022 echo: EF 55%. GIIDD. RV size and function nl. Nl PASP. LA mildly dilated. Mild MR. Borderline dilatation of aortic root, 40 mmg 04/19/2022 CT chest w contrast: atherosclerosis. S/p right upper lobectomy.small amount of pleural fluid and gas in apex of right hemithorax, favored to be chronic. New pleural based nodule 9x6 mm in RLL. New 5 mm LLL nodule. Aneurysmal dilatation of the abdominal aorta.  Simple cysts both kidneys.  01/23/2022: OV with Dr. Delton Coombes.  Since he was seen last, he underwent robotic assisted right upper lobectomy.  He had prolonged air leak and had endobronchial valves placed on 11/22/2021.  Planning for endobronchial valve removal with Dr. Dorris Fetch on 1/24.  Reports that he gets some exertional shortness of breath.  Questions that this is associated with his A-fib.  He coughs rarely.  No sputum.  Started on Spiriva.  Given an albuterol inhaler for rescue.  Follow-up with oncology as scheduled.  07/11/2022: Today-follow-up Patient presents today for follow-up.  Since he was here last he had a fall and broke his right hip requiring surgery.  He was discharged to rehab.  Was able to come home in May.  Has been doing relatively well since.  Does not feel like he is having any difficulties with his breathing.  No  significant cough.  Using his Spiriva daily.  Denies any chest congestion or wheezing.  Has follow-up CT scan in July.   No Known Allergies  Immunization History  Administered Date(s) Administered   Fluad Quad(high Dose 65+) 11/25/2019   Influenza, High Dose Seasonal PF 11/08/2016, 11/13/2017, 10/09/2018, 10/25/2020   Influenza-Unspecified 10/22/2021   Moderna Covid-19 Vaccine Bivalent Booster 80yrs & up 10/25/2020   Moderna Sars-Covid-2 Vaccination 03/04/2019, 04/01/2019, 12/05/2019, 08/02/2020   PFIZER Comirnaty(Gray Top)Covid-19 Tri-Sucrose Vaccine 02/11/2022   Pneumococcal Conjugate-13 11/25/2019   Pneumococcal Polysaccharide-23 10/09/2018   Zoster Recombinat (Shingrix) 10/09/2018, 10/09/2018, 01/01/2019    Past Medical History:  Diagnosis Date   AAA (abdominal aortic aneurysm) (HCC)    Arthritis    Cancer (HCC)    Carotid artery disease (HCC)    Nonobstructive   Cataract    COPD (chronic obstructive pulmonary disease) (HCC)    Coronary atherosclerosis of native coronary artery    PTCA small diagonal 2007 otherwise nonobstructive CAD   Depression    Dysrhythmia    Essential hypertension, benign    Hyperlipidemia    NSTEMI (non-ST elevated myocardial infarction) (HCC) 2007   Stroke [facility]) 2004   TIA (transient ischemic attack) 2006    Tobacco History: Social History   Tobacco Use  Smoking Status Former   Packs/day: 1.00   Years: 40.00   Additional pack years: 0.00   Total pack years: 40.00   Types: Cigarettes  Quit date: 07/2021   Years since quitting: 0.9  Smokeless Tobacco Never  Tobacco Comments   1 pack of cigarettes smoked daily. 07/17/21 ARJ, RN    Counseling given: Not Answered Tobacco comments: 1 pack of cigarettes smoked daily. 07/17/21 ARJ, RN    Outpatient Medications Prior to Visit  Medication Sig Dispense Refill   acetaminophen (TYLENOL) 500 MG tablet GIVE 2 TABLETS (1,000 mg TOTAL) BY MOUTH EVERY 6 HOURS AS NEEDED FOR PAIN 60 tablet 0    albuterol (VENTOLIN HFA) 108 (90 Base) MCG/ACT inhaler Inhale 2 puffs into the lungs every 6 (six) hours as needed for wheezing or shortness of breath. 6.7 g 6   apixaban (ELIQUIS) 5 MG TABS tablet Take 1 tablet (5 mg total) by mouth 2 (two) times daily. 180 tablet 3   atorvastatin (LIPITOR) 80 MG tablet Take 1 tablet (80 mg total) by mouth daily. 90 tablet 3   cyclobenzaprine (FLEXERIL) 10 MG tablet Take 1 tablet (10 mg total) by mouth 2 (two) times daily as needed. 60 tablet 2   escitalopram (LEXAPRO) 10 MG tablet Take 1 tablet (10 mg total) by mouth daily. 90 tablet 3   folic acid (KP FOLIC ACID) 1 MG tablet Take 1 tablet (1 mg total) by mouth daily. 90 tablet 3   furosemide (LASIX) 20 MG tablet Take 1 tablet (20 mg total) by mouth daily as needed. 90 tablet 2   HYDROcodone-acetaminophen (NORCO/VICODIN) 5-325 MG tablet Take 1 tablet by mouth every 6 (six) hours as needed, for pain. 60 tablet 0   magnesium oxide (MAG-OX) 400 (240 Mg) MG tablet Take 1 tablet (400 mg total) by mouth daily. 120 tablet 3   metoprolol succinate (TOPROL-XL) 25 MG 24 hr tablet Take 3 tablets (75 mg total) by mouth daily. Take with or immediately following a meal. Do Not Crush. (Patient taking differently: Take 50 mg by mouth daily.) 270 tablet 3   pantoprazole (PROTONIX) 40 MG tablet Take 1 tablet (40 mg total) by mouth daily. 90 tablet 3   tamsulosin (FLOMAX) 0.4 MG CAPS capsule Take 1 capsule (0.4 mg total) by mouth daily. 90 capsule 3   Tiotropium Bromide Monohydrate (SPIRIVA RESPIMAT) 2.5 MCG/ACT AERS Inhale 2 puffs into the lungs daily. 18 g 3   Facility-Administered Medications Prior to Visit  Medication Dose Route Frequency Provider Last Rate Last Admin   sodium chloride flush (NS) 0.9 % injection 10 mL  10 mL Intracatheter PRN [name], MD   10 mL at 07/10/22 1513     Review of Systems:   Constitutional: No weight loss or gain, night sweats, fevers, chills, or lassitude. +fatigue  (baseline) HEENT: No headaches, difficulty swallowing, tooth/dental problems, or sore throat. No sneezing, itching, ear ache, nasal congestion, or post nasal drip CV:  No chest pain, orthopnea, PND, swelling in lower extremities, anasarca, dizziness, palpitations, syncope Resp: +shortness of breath with exertion (improved; minimal); rare dry cough (baseline). No excess mucus or change in color of mucus. No hemoptysis. No wheezing.  No chest wall deformity GI:  No heartburn, indigestion.  MSK: +occasional right hip pain Neuro: No dizziness or lightheadedness.  Psych: No depression or anxiety. Mood stable.     Physical Exam:  BP (!) 90/54   Pulse 65   Ht 5\' 10"  (1.778 m)   Wt 151 lb 12.8 oz (68.9 kg)   SpO2 99%   BMI 21.78 kg/m   GEN: Pleasant, interactive, chronically-ill appearing; in no acute distress. HEENT:  Normocephalic and atraumatic.  PERRLA. Sclera white. Nasal turbinates pink, moist and patent bilaterally. No rhinorrhea present. Oropharynx pink and moist, without exudate or edema. No lesions, ulcerations, or postnasal drip.  NECK:  Supple w/ fair ROM. No JVD present. Normal carotid impulses w/o bruits. Thyroid symmetrical with no goiter or nodules palpated. No lymphadenopathy.   CV: RRR, no m/r/g, no peripheral edema. Pulses intact, +2 bilaterally. No cyanosis, pallor or clubbing. PULMONARY:  Unlabored, regular breathing. Clear bilaterally A&P w/o wheezes/rales/rhonchi. No accessory muscle use.  GI: BS present and normoactive. Soft, non-tender to palpation. No organomegaly or masses detected. MSK: No erythema, warmth or tenderness. Cap refil <2 sec all extrem. No deformities or joint swelling noted. Muscle wasting Neuro: A/Ox3. No focal deficits noted.   Skin: Warm, no lesions or rashe Psych: Normal affect and behavior. Judgement and thought content appropriate.     Lab Results:  CBC    Component Value Date/Time   WBC 6.3 07/10/2022 1148   RBC 3.11 (L) 07/10/2022 1148    HGB 9.9 (L) 07/10/2022 1148   HGB 10.1 (L) 06/12/2022 1620   HCT 30.6 (L) 07/10/2022 1148   HCT 29.8 (L) 06/12/2022 1620   PLT 120 (L) 07/10/2022 1148   PLT 135 (L) 06/12/2022 1620   MCV 98.4 07/10/2022 1148   MCV 92 06/12/2022 1620   MCH 31.8 07/10/2022 1148   MCHC 32.4 07/10/2022 1148   RDW 14.4 07/10/2022 1148   RDW 13.7 06/12/2022 1620   LYMPHSABS 1.2 07/10/2022 1148   LYMPHSABS 2.1 07/13/2020 1006   MONOABS 0.4 07/10/2022 1148   EOSABS 0.1 07/10/2022 1148   EOSABS 0.1 07/13/2020 1006   BASOSABS 0.0 07/10/2022 1148   BASOSABS 0.1 07/13/2020 1006    BMET    Component Value Date/Time   NA 134 (L) 07/10/2022 1148   NA 140 06/12/2022 1620   K 3.6 07/10/2022 1148   CL 103 07/10/2022 1148   CO2 25 07/10/2022 1148   GLUCOSE 118 (H) 07/10/2022 1148   BUN 14 07/10/2022 1148   BUN 15 06/12/2022 1620   CREATININE 0.95 07/10/2022 1148   CALCIUM 8.6 (L) 07/10/2022 1148   GFRNONAA >60 07/10/2022 1148   GFRAA 89 02/15/2020 1145    BNP    Component Value Date/Time   BNP 62.8 12/04/2021 2052     Imaging:  DG Chest 2 View  Result Date: 07/09/2022 CLINICAL DATA:  Lung carcinoma EXAM: CHEST - 2 VIEW COMPARISON:  Previous chest radiographs including the study done on 02/05/2022, CT chest done on 04/19/2022 FINDINGS: There is previous partial removal of right lung. There is pleural density in the right apex, possibly loculated effusion or pleural thickening. There is interval clearing of loculated air in right apex since the previous studies. There are no signs of alveolar pulmonary edema or focal pulmonary consolidation. Costophrenic angles are clear. There is no pneumothorax. Tip of chest port is seen in superior vena cava. IMPRESSION: There are no signs of pulmonary edema or focal pulmonary consolidation. There is partial resection of right lung. There is right apical opacity which may be suggestive of loculated pleural effusion or scarring. There is interval clearing of  loculated pocket of air seen in the right apex since the previous studies. Electronically Signed   By: Ernie Avena M.D.   On: 07/09/2022 11:13    ferumoxytol (FERAHEME) 510 mg in sodium chloride 0.9 % 100 mL IVPB     Date Action Dose Route User   06/13/2022 0927 New Bag/Given 510 mg Intravenous Hill,  Grayling Congress, RN      heparin lock flush 100 unit/mL     Date Action Dose Route User   06/13/2022 1010 Given 500 Units Intracatheter [address] T, RN      heparin lock flush 100 unit/mL     Date Action Dose Route User   07/10/2022 1513 Given 500 Units Intracatheter Rosanne Gutting, RN      pembrolizumab [facility]) 200 mg in sodium chloride 0.9 % 50 mL chemo infusion     Date Action Dose Route User   07/10/2022 1434 Infusion Verify (none) Intravenous Rosanne Gutting, RN   07/10/2022 1434 New Bag/Given 200 mg Intravenous Dicky Doe D, RN      0.9 %  sodium chloride infusion     Date Action Dose Route User   06/13/2022 [phone] Infusion Verify (none) Intravenous Consepcion Hearing, RN   06/13/2022 [phone] Rate/Dose Change (none) Intravenous Consepcion Hearing, RN   06/13/2022 [phone] Rate/Dose Change (none) Intravenous Consepcion Hearing, RN   06/13/2022 [phone] Rate/Dose Change (none) Intravenous Consepcion Hearing, RN   06/13/2022 1478 Rate/Dose Change (none) Intravenous Geronimo Boot T, RN      0.9 %  sodium chloride infusion     Date Action Dose Route User   07/10/2022 1508 Rate/Dose Change (none) Intravenous Rosanne Gutting, RN   07/10/2022 1326 New Bag/Given (none) Intravenous Rosanne Gutting, RN      sodium chloride flush (NS) 0.9 % injection 10 mL     Date Action Dose Route User   06/13/2022 [phone] Given 10 mL Intravenous Presnell, Brandy D, RN      sodium chloride flush (NS) 0.9 % injection 10 mL     Date Action Dose Route User   06/13/2022 1010 Given 10 mL Intracatheter Geronimo Boot T, RN      sodium chloride flush (NS) 0.9 % injection 10 mL     Date Action Dose Route User    07/10/2022 1157 Given 10 mL Intravenous Page, Epimenio Sarin, RN      sodium chloride flush (NS) 0.9 % injection 10 mL     Date Action Dose Route User   07/10/2022 1513 Given 10 mL Intracatheter Rosanne Gutting, RN          Latest Ref Rng & Units 07/03/2021   12:35 PM  PFT Results  FVC-Pre L 4.65   FVC-Predicted Pre % 108   FVC-Post L 4.75   FVC-Predicted Post % 111   Pre FEV1/FVC % % 67   Post FEV1/FCV % % 67   FEV1-Pre L 3.11   FEV1-Predicted Pre % 100   FEV1-Post L 3.20   DLCO uncorrected ml/min/mmHg 11.91   DLCO UNC% % 47   DLVA Predicted % 59   TLC L 7.19   TLC % Predicted % 102   RV % Predicted % 122     No results found for: "NITRICOXIDE"      Assessment & Plan:   COPD (chronic obstructive pulmonary disease) (HCC) Mild COPD. Appears compensated on current regimen. No recent exacerbations requiring steroids or abx. No hospitalizations for his breathing. Action plan in place.  Patient Instructions  Continue Albuterol inhaler 2 puffs every 6 hours as needed for shortness of breath or wheezing. Notify if symptoms persist despite rescue inhaler/neb use.  Continue Spiriva 2 puffs daily  Attend CT scan of the chest in July Follow up with oncology afterwards  Follow up in 6 months with Dr. Delton Coombes. If symptoms worsen,  please contact office for sooner follow up or seek emergency care.    Primary adenocarcinoma of upper lobe of right lung (HCC) Follow up CT chest in July 2024. He will see oncology afterwards.    I spent 25 minutes of dedicated to the care of this patient on the date of this encounter to include pre-visit review of records, face-to-face time with the patient discussing conditions above, post visit ordering of testing, clinical documentation with the electronic health record, making appropriate referrals as documented, and communicating necessary findings to members of the patients care team.  Noemi Chapel, NP 07/11/2022  Pt aware and  understands NP's role.

## 2022-07-11 NOTE — Patient Instructions (Addendum)
Continue Albuterol inhaler 2 puffs every 6 hours as needed for shortness of breath or wheezing. Notify if symptoms persist despite rescue inhaler/neb use.  Continue Spiriva 2 puffs daily  Attend CT scan of the chest in July Follow up with oncology afterwards  Follow up in 6 months with Dr. Delton Coombes. If symptoms worsen, please contact office for sooner follow up or seek emergency care.

## 2022-07-11 NOTE — Assessment & Plan Note (Signed)
Mild COPD. Appears compensated on current regimen. No recent exacerbations requiring steroids or abx. No hospitalizations for his breathing. Action plan in place.  Patient Instructions  Continue Albuterol inhaler 2 puffs every 6 hours as needed for shortness of breath or wheezing. Notify if symptoms persist despite rescue inhaler/neb use.  Continue Spiriva 2 puffs daily  Attend CT scan of the chest in July Follow up with oncology afterwards  Follow up in 6 months with Dr. Delton Coombes. If symptoms worsen, please contact office for sooner follow up or seek emergency care.

## 2022-07-11 NOTE — Assessment & Plan Note (Signed)
Follow up CT chest in July 2024. He will see oncology afterwards.

## 2022-07-12 ENCOUNTER — Other Ambulatory Visit (HOSPITAL_COMMUNITY): Payer: Self-pay

## 2022-07-15 ENCOUNTER — Ambulatory Visit: Payer: HMO

## 2022-07-15 ENCOUNTER — Ambulatory Visit: Payer: HMO | Admitting: Hematology

## 2022-07-15 ENCOUNTER — Other Ambulatory Visit: Payer: HMO

## 2022-07-15 ENCOUNTER — Other Ambulatory Visit (HOSPITAL_COMMUNITY): Payer: Self-pay

## 2022-07-16 ENCOUNTER — Other Ambulatory Visit: Payer: Self-pay

## 2022-07-16 ENCOUNTER — Other Ambulatory Visit (HOSPITAL_COMMUNITY): Payer: Self-pay

## 2022-07-19 ENCOUNTER — Telehealth: Payer: Self-pay | Admitting: Cardiology

## 2022-07-19 ENCOUNTER — Ambulatory Visit: Payer: HMO | Admitting: Student

## 2022-07-19 NOTE — Progress Notes (Deleted)
Cardiology Clinic Note   Date: 07/19/2022 ID: DEJION BOUTON, DOB October 13, 1945, MRN [id number]  Primary Cardiologist:  [name], MD  Patient Profile    Darryl Ward is a 77 y.o. male who presents to the clinic today for ***    Past medical history significant for: CAD. LHC (MI) 2007: PTCA diagonal. Nuclear stress test 12/30/2011: Negative for ischemia. Chronic combined systolic and diastolic heart failure.  Echo 03/04/2022: EF 55%.  Grade 2 DD.  Normal RV function.  Mild LAE.  Borderline dilatation of aortic root 40 mm.  Dilated IVC, RA pressure 8 mmHg. PAF.  Onset August 2023. TEE/DCCV 09/20/2021. DCCV aborted due to sinus bradycardia 01/15/2022. Carotid artery disease. Carotid ultrasound 02/04/2020: Moderate to large amount of left-sided atherosclerotic plaque 50 to 69%.  Moderate amount of right-sided atherosclerotic plaque not resulting in hemodynamically significant stenosis. PAD. Arterial ultrasound lower extremity/ABI 06/04/2013:>50% left mid superficial femoral artery.  Patent left limb aorta bifemoral bypass graft.  Normal arterial perfusion of the right lower extremity and mildly decreased arterial perfusion of the left lower extremity. AAA. Aortobifemoral bypass, reimplantation of inferior mesenteric artery 01/08/2012. Hypertension. Hyperlipidemia. COPD. Right upper lobe lung cancer.  Tobacco abuse.  CVA.     History of Present Illness    Darryl Ward is a longtime patient of cardiology.  Patient is followed by Dr. Antoine Poche for the above outlined history.  Patient was last seen in the office by Darryl Person, NP on 02/05/2018 for routine follow-up.  He was doing well from a cardiac standpoint.  He was pending 1 year of immunotherapy for lung cancer.  No medication changes were made.  Patient presented to the ED from home via EMS on 04/27/2022 for mechanical fall. He was found to be mildly dehydrated and sustained a right hip fracture. He underwent hemiarthroplasty  on 04/28/2022. He was discharged to a SNF on 05/01/2022. He returned to the ED on the same day as discharge from SNF for altered mental status. Patient's wife noted patient appeared confused prior to discharge. CT head and MRI brain showed no acute abnormalities with chronic changes. Symptoms attributable to delirium. He was discharged back to SNF.   Today, patient ***  CAD. PTCA diagonal 2007. Normal nuclear stress test December 2013. Patient *** Continue statin, metoprolol.  Not on aspirin secondary to Eliquis. Chronic combined systolic and diastolic heart failure.  Echo February 2024 showed normal LV/RV function, grade 2 DD, mild LAE, borderline dilatation of aortic root. Patient *** Euvolemic and well compensated on exam. Continue metoprolol, Lasix. PAF.  Onset August 2023.  S/p TEE/DCCV August 2023.  Patient*** Continue metoprolol, Eliquis. Hypertension: BP today *** Patient denies headaches, dizziness or vision changes. Continue ***    ROS: All other systems reviewed and are otherwise negative except as noted in History of Present Illness.  Studies Reviewed       ***  Risk Assessment/Calculations    {Does this patient have ATRIAL FIBRILLATION?:[id number]} No BP recorded.  {Refresh Note OR Click here to enter BP  :1}***        Physical Exam    VS:  There were no vitals taken for this visit. , BMI There is no height or weight on file to calculate BMI.  GEN: Well nourished, well developed, in no acute distress. Neck: No JVD or carotid bruits. Cardiac: *** RRR. No murmurs. No rubs or gallops.   Respiratory:  Respirations regular and unlabored. Clear to auscultation without rales, wheezing or rhonchi. GI: Soft, nontender,  nondistended. Extremities: Radials/DP/PT 2+ and equal bilaterally. No clubbing or cyanosis. No edema ***  Skin: Warm and dry, no rash. Neuro: Strength intact.  Assessment & Plan   ***  Disposition: ***     {Are you ordering a CV Procedure (e.g. stress test,  cath, DCCV, TEE, etc)?   Press F2        :[id number]}   Signed, Darryl Ward. Darryl Judy, DNP, NP-C

## 2022-07-19 NOTE — Telephone Encounter (Signed)
Left message for pt/caregiver requesting call back to direct line.  [name] Biochemist, clinical) returned call. Apologized for late cancellation and explained provider had a family emergency. Offered to reschedule appointment to next week. [name] accepted appointment with Edd Fabian, NP, on 07/26/22 at 1:30pm.

## 2022-07-19 NOTE — Telephone Encounter (Signed)
Called patient to r/s appt for today with Darryl Levering, NP due to a personal emergency. Spoke to significant other, [name], who expressed she was very frustrated. She stated that it takes several hours to get him up and ready and requested an appointment today. I told her that I did not see anything else available for today but could get him on Tuesday 07/05 and at that time [name] hung up and the phone disconnected.

## 2022-07-22 NOTE — Progress Notes (Signed)
Cardiology Clinic Note   Patient Name: Darryl Ward Date of Encounter: 07/26/2022  Primary Care Provider:  [name], DO Primary Cardiologist:  [name], MD  Patient Profile    Darryl Ward 77 year old male presents to the clinic today for follow-up evaluation of his coronary artery disease, chronic combined systolic and diastolic CHF, and paroxysmal atrial fibrillation.  Past Medical History    Past Medical History:  Diagnosis Date   AAA (abdominal aortic aneurysm) (HCC)    Arthritis    Cancer (HCC)    Carotid artery disease (HCC)    Nonobstructive   Cataract    COPD (chronic obstructive pulmonary disease) (HCC)    Coronary atherosclerosis of native coronary artery    PTCA small diagonal 2007 otherwise nonobstructive CAD   Depression    Dysrhythmia    Essential hypertension, benign    Hyperlipidemia    NSTEMI (non-ST elevated myocardial infarction) (HCC) 2007   Stroke [facility]) 2004   TIA (transient ischemic attack) 2006   Past Surgical History:  Procedure Laterality Date   AORTA - BILATERAL FEMORAL ARTERY BYPASS GRAFT  01/08/2012   Procedure: AORTA BIFEMORAL BYPASS GRAFT;  Surgeon: Sherren Kerns, MD;  Location: [facility] OR;  Service: Vascular;  Laterality: Bilateral;  using 18x24mm x 40cm Hemashield Gold Vascular Graft with Endarterectomy, Thombectomy and  Reimplantation of Inferior Mesenteric Artery   BACK SURGERY  2021   BRONCHIAL BIOPSY  06/11/2021   Procedure: BRONCHIAL BIOPSIES;  Surgeon: Leslye Peer, MD;  Location: [facility] ENDOSCOPY;  Service: Pulmonary;;   BRONCHIAL BRUSHINGS  06/11/2021   Procedure: BRONCHIAL BRUSHINGS;  Surgeon: Leslye Peer, MD;  Location: [facility] ENDOSCOPY;  Service: Pulmonary;;   BRONCHIAL NEEDLE ASPIRATION BIOPSY  06/11/2021   Procedure: BRONCHIAL NEEDLE ASPIRATION BIOPSIES;  Surgeon: Leslye Peer, MD;  Location: [facility] ENDOSCOPY;  Service: Pulmonary;;   CARDIOVERSION N/A 09/20/2021   Procedure: CARDIOVERSION;  Surgeon: [name], MD;  Location: [facility] ORS;  Service: Cardiovascular;  Laterality: N/A;   COLONOSCOPY N/A 04/14/2019   Procedure: COLONOSCOPY;  Surgeon: Corbin Ade, MD;  Location: [facility] ENDO SUITE;  Service: Endoscopy;  Laterality: N/A;  9:30   COLONOSCOPY WITH PROPOFOL N/A 07/25/2021   Procedure: COLONOSCOPY WITH PROPOFOL;  Surgeon: Lanelle Bal, DO;  Location: [facility] ENDO SUITE;  Service: Endoscopy;  Laterality: N/A;  1:00pm   FIDUCIAL MARKER PLACEMENT  06/11/2021   Procedure: FIDUCIAL MARKER PLACEMENT;  Surgeon: Leslye Peer, MD;  Location: [facility] ENDOSCOPY;  Service: Pulmonary;;   HIP ARTHROPLASTY Right 04/28/2022   Procedure: ARTHROPLASTY BIPOLAR HIP (HEMIARTHROPLASTY);  Surgeon: Sheral Apley, MD;  Location: [facility] OR;  Service: Orthopedics;  Laterality: Right;   INTERCOSTAL NERVE BLOCK Right 11/12/2021   Procedure: INTERCOSTAL NERVE BLOCK;  Surgeon: Loreli Slot, MD;  Location: [facility] OR;  Service: Thoracic;  Laterality: Right;   IR IMAGING GUIDED PORT INSERTION  07/31/2021   Left cataract surgery     LYMPH NODE DISSECTION Right 11/12/2021   Procedure: LYMPH NODE DISSECTION;  Surgeon: Loreli Slot, MD;  Location: [facility] OR;  Service: Thoracic;  Laterality: Right;   POLYPECTOMY  04/14/2019   Procedure: POLYPECTOMY;  Surgeon: Corbin Ade, MD;  [facility] ENDO SUITE;  Service: Endoscopy;;   POLYPECTOMY  07/25/2021   Procedure: POLYPECTOMY;  Surgeon: Lanelle Bal, DO;  Location: [facility] ENDO SUITE;  Service: Endoscopy;;   TEE WITHOUT CARDIOVERSION N/A 09/20/2021   Procedure: TRANSESOPHAGEAL ECHOCARDIOGRAM (TEE);  Surgeon: [name], MD;  Location: [facility] ORS;  Service: Cardiovascular;  Laterality: N/A;   TRANSFORAMINAL LUMBAR INTERBODY FUSION (TLIF) WITH PEDICLE SCREW FIXATION 1 LEVEL N/A 04/27/2020   Procedure: Transforaminal Lumbar Interbody Fusion Lumbar Five-Sacral One;  Surgeon: Bedelia Person, MD;  Location: [facility] OR;  Service: Neurosurgery;  Laterality: N/A;   VIDEO  BRONCHOSCOPY WITH INSERTION OF INTERBRONCHIAL VALVE (IBV) N/A 11/22/2021   Procedure: VIDEO BRONCHOSCOPY WITH INSERTION OF INTERBRONCHIAL VALVE (IBV);  Surgeon: Loreli Slot, MD;  Location: [facility] OR;  Service: Thoracic;  Laterality: N/A;   VIDEO BRONCHOSCOPY WITH INSERTION OF INTERBRONCHIAL VALVE (IBV) N/A 01/24/2022   Procedure: VIDEO BRONCHOSCOPY WITH REMOVAL OF INTERBRONCHIAL VALVE (IBV);  Surgeon: Loreli Slot, MD;  Location: [facility] OR;  Service: Thoracic;  Laterality: N/A;   VIDEO BRONCHOSCOPY WITH RADIAL ENDOBRONCHIAL ULTRASOUND  06/11/2021   Procedure: VIDEO BRONCHOSCOPY WITH RADIAL ENDOBRONCHIAL ULTRASOUND;  Surgeon: Leslye Peer, MD;  Location: [facility] ENDOSCOPY;  Service: Pulmonary;;    Allergies  No Known Allergies  History of Present Illness    Darryl Ward has a PMH of coronary artery disease, paroxysmal atrial fibrillation, combined systolic and diastolic CHF, HTN, HLD, CVA, PVD, stage II lung adenocarcinoma, tobacco use, and chronic anemia/thrombocytopenia.  He had MI in 2007.  He underwent nuclear stress testing in 2013 which was negative for ischemia.  He was hospitalized 8/23 in the setting of atrial fibrillation with RVR.  He underwent TEE/DCCV.  His echocardiogram at that time showed an LVEF of 30-35%, moderately decreased LV function, global HK of LV, moderate BAE, and no significant valvular abnormalities.  He was seen in the clinic on 12/27/2021.  He was noted to have recurrent atrial fibrillation.  He was scheduled for repeat DCCV.  Upon arrival he was in sinus bradycardia.  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  His postoperative course was complicated by prolonged air leak requiring IV placement.[name]  He was seen in follow-up by Bernadene Person NP on 02/05/2022.  He was accompanied by his significant other.  He remained stable from a  cardiac standpoint.  He denies chest pain, dyspnea, palpitations, weight gain and lower extremity swelling.  He had recently undergone IV removal per CT surgery.  He planned to undergo 1 year of immunotherapy for his lung cancer.  Overall he was doing well from a cardiac standpoint.  He presents to the clinic today for follow-up evaluation and states he sustained a hip fracture in the spring.  He then was in rehab at [facility] until May.  He is now returned home and is doing home physical therapy.  He is able to use a walker and a cane at home.  His blood pressure today is 88/58.  I encouraged him to increase his p.o. hydration.  We reviewed his heart failure.  He expressed understanding.  I will have him maintain a blood pressure log.  Will plan follow-up in 4 to 6 months.  Today he denies chest pain, shortness of breath, lower extremity edema, fatigue, palpitations, melena, hematuria, hemoptysis, diaphoresis, weakness, presyncope, syncope, orthopnea, and PND.   Home Medications    Prior to Admission medications   Medication Sig Start Date End Date Taking? Authorizing Provider  acetaminophen (TYLENOL) 500 MG tablet GIVE 2 TABLETS (1,000 mg TOTAL) BY MOUTH EVERY 6 HOURS AS NEEDED FOR PAIN 06/08/22     albuterol (VENTOLIN HFA) 108 (90 Base) MCG/ACT inhaler Inhale  2 puffs into the lungs every 6 (six) hours as needed for wheezing or shortness of breath. 01/23/22   Leslye Peer, MD  apixaban (ELIQUIS) 5 MG TABS tablet Take 1 tablet (5 mg total) by mouth 2 (two) times daily. 06/19/22   [name], DO  atorvastatin (LIPITOR) 80 MG tablet Take 1 tablet (80 mg total) by mouth daily. 06/19/22   [name], DO  cyclobenzaprine (FLEXERIL) 10 MG tablet Take 1 tablet (10 mg total) by mouth 2 (two) times daily as needed. 06/19/22   [name], DO  escitalopram (LEXAPRO) 10 MG tablet Take 1 tablet (10 mg total) by mouth daily. 06/19/22   [name], DO  folic acid (KP FOLIC ACID) 1 MG tablet Take 1 tablet  (1 mg total) by mouth daily. 06/19/22   [name], DO  furosemide (LASIX) 20 MG tablet Take 1 tablet (20 mg total) by mouth daily as needed. 06/19/22   [name], DO  HYDROcodone-acetaminophen (NORCO/VICODIN) 5-325 MG tablet Take 1 tablet by mouth every 6 (six) hours as needed, for pain. 06/08/22     magnesium oxide (MAG-OX) 400 (240 Mg) MG tablet Take 1 tablet (400 mg total) by mouth daily. 06/19/22   [name], DO  metoprolol succinate (TOPROL-XL) 25 MG 24 hr tablet Take 3 tablets (75 mg total) by mouth daily. Take with or immediately following a meal. Do Not Crush. Patient taking differently: Take 50 mg by mouth daily. 06/19/22   [name], DO  pantoprazole (PROTONIX) 40 MG tablet Take 1 tablet (40 mg total) by mouth daily. 06/19/22   [name], DO  tamsulosin (FLOMAX) 0.4 MG CAPS capsule Take 1 capsule (0.4 mg total) by mouth daily. 06/12/22   [name], DO  Tiotropium Bromide Monohydrate (SPIRIVA RESPIMAT) 2.5 MCG/ACT AERS Inhale 2 puffs into the lungs daily. 06/19/22   [name], DO    Family History    Family History  Problem Relation Age of Onset   Hyperlipidemia Sister    Heart attack Brother 64   Cancer - Colon Neg Hx    He indicated that his mother is deceased. He indicated that his father is deceased. He indicated that the status of his sister is unknown. He indicated that the status of his brother is unknown. He indicated that the status of his neg hx is unknown.  Social History    Social History   Socioeconomic History   Marital status: Divorced    Spouse name: Not on file   Number of children: 1   Years of education: 11   Highest education level: 11th grade  Occupational History    Employer: Engineer, materials  Tobacco Use   Smoking status: Former    Packs/day: 1.00    Years: 40.00    Additional pack years: 0.00    Total pack years: 40.00    Types: Cigarettes    Quit date: 07/2021    Years since quitting: 1.0   Smokeless tobacco: Never    Tobacco comments:    1 pack of cigarettes smoked daily. 07/17/21 ARJ, RN   Vaping Use   Vaping Use: Never used  Substance and Sexual Activity   Alcohol use: No    Comment: Prior history of regular alcohol use   Drug use: No   Sexual activity: Not Currently  Other Topics Concern   Not on file  Social History Narrative   Not on file   Social  Determinants of Health   Financial Resource Strain: High Risk (06/11/2022)   Overall Financial Resource Strain (CARDIA)    Difficulty of Paying Living Expenses: Hard  Food Insecurity: Patient Declined (06/11/2022)   Hunger Vital Sign    Worried About Running Out of Food in the Last Year: Patient declined    Ran Out of Food in the Last Year: Patient declined  Transportation Needs: Unmet Transportation Needs (06/11/2022)   PRAPARE - Administrator, Civil Service (Medical): Yes    Lack of Transportation (Non-Medical): Yes  Physical Activity: Unknown (06/11/2022)   Exercise Vital Sign    Days of Exercise per Week: 0 days    Minutes of Exercise per Session: Not on file  Stress: No Stress Concern Present (06/11/2022)   [employer] of Occupational Health - Occupational Stress Questionnaire    Feeling of Stress : Not at all  Social Connections: Socially Isolated (06/11/2022)   Social Connection and Isolation Panel [NHANES]    Frequency of Communication with Friends and Family: Once a week    Frequency of Social Gatherings with Friends and Family: Never    Attends Religious Services: Never    Database administrator or Organizations: No    Attends Engineer, structural: Not on file    Marital Status: Divorced  Intimate Partner Violence: Not At Risk (05/03/2022)   Humiliation, Afraid, Rape, and Kick questionnaire    Fear of Current or Ex-Partner: No    Emotionally Abused: No    Physically Abused: No    Sexually Abused: No     Review of Systems    General:  No chills, fever, night sweats or weight changes.  Cardiovascular:   No chest pain, dyspnea on exertion, edema, orthopnea, palpitations, paroxysmal nocturnal dyspnea. Dermatological: No rash, lesions/masses Respiratory: No cough, dyspnea Urologic: No hematuria, dysuria Abdominal:   No nausea, vomiting, diarrhea, bright red blood per rectum, melena, or hematemesis Neurologic:  No visual changes, wkns, changes in mental status. All other systems reviewed and are otherwise negative except as noted above.  Physical Exam    VS:  BP (!) 88/58   Pulse (!) 57   Ht 5\' 11"  (1.803 m)   Wt 150 lb 12.8 oz (68.4 kg)   SpO2 92%   BMI 21.03 kg/m  , BMI Body mass index is 21.03 kg/m. GEN: Well nourished, well developed, in no acute distress. HEENT: normal. Neck: Supple, no JVD, carotid bruits, or masses. Cardiac: RRR, no murmurs, rubs, or gallops. No clubbing, cyanosis, edema.  Radials/DP/PT 2+ and equal bilaterally.  Respiratory:  Respirations regular and unlabored, clear to auscultation bilaterally. GI: Soft, nontender, nondistended, BS + x 4. MS: no deformity or atrophy. Skin: warm and dry, no rash. Neuro:  Strength and sensation are intact. Psych: Normal affect.  Accessory Clinical Findings    Recent Labs: 12/04/2021: B Natriuretic Peptide 62.8 06/13/2022: TSH 1.239 07/10/2022: ALT 15; BUN 14; Creatinine, Ser 0.95; Hemoglobin 9.9; Magnesium 1.7; Platelets 120; Potassium 3.6; Sodium 134   Recent Lipid Panel    Component Value Date/Time   CHOL 202 (H) 02/05/2020 0755   CHOL 198 11/25/2019 1123   TRIG 99 02/05/2020 0755   HDL 29 (L) 02/05/2020 0755   HDL 36 (L) 11/25/2019 1123   CHOLHDL 7.0 02/05/2020 0755   VLDL 20 02/05/2020 0755   LDLCALC 153 (H) 02/05/2020 0755   LDLCALC 139 (H) 11/25/2019 1123         ECG personally reviewed by me today-none today.  Myoview 12/30/2011: IMPRESSION:  Overall low risk Lexiscan Myoview as outlined.  There were no  diagnostic ST-segment abnormalities, occasional PACs and PVCs noted  without sustained  arrhythmia.  Perfusion imaging is most consistent  with soft tissue attenuation affecting the inferior septal wall  without clear evidence of ischemia, and normal LVEF of 62%.    Echo 09/17/2021: IMPRESSIONS    1. Left ventricular ejection fraction, by estimation, is 35 to 40%. The  left ventricle has moderately decreased function. The left ventricle  demonstrates global hypokinesis. Left ventricular diastolic parameters are  indeterminate.   2. Right ventricular systolic function is normal. The right ventricular  size is normal. There is normal pulmonary artery systolic pressure. The  estimated right ventricular systolic pressure is 29.2 mmHg.   3. Left atrial size was moderately dilated.   4. The mitral valve is normal in structure. Mild mitral valve  regurgitation. No evidence of mitral stenosis.   5. Tricuspid valve regurgitation is moderate.   6. The aortic valve is normal in structure. Aortic valve regurgitation is  not visualized. No aortic stenosis is present.   7. The inferior vena cava is dilated in size with >50% respiratory  variability, suggesting right atrial pressure of 8 mmHg.   Comparison(s): Prior images reviewed side by side. The left ventricular  function is worsened. Prior EF 60%.    Assessment & Plan   1. Coronary artery disease-no chest pain today.  Nuclear stress test 2013 was negative for ischemia. Continue current medical therapy Increase physical activity as tolerated  Paroxysmal atrial fibrillation-heart rate today 57 bpm.  Reports compliance with apixaban.  Denies bleeding issues. Continue metoprolol, apixaban Avoid triggers caffeine, chocolate, EtOH, dehydration etc.  Essential hypertension-BP today 88/58.  Would like to continue current dosing of metoprolol and furosemide due to heart failure. Maintain blood pressure log Continue metoprolol   Chronic combined systolic and diastolic CHF-euvolemic today.  Weight stable.  NYHA class II. Continue  metoprolol, furosemide Daily weights-contact office with a weight increase of 2 to 3 pounds overnight or 5 pounds in 1 week Elevate lower extremities when not active Recommended lower extremity support stockings  History of CVA-neuro intact. Follows with PCP  Adenocarcinoma of right lung stage II-status post lobectomy 10/23.  Continues with immunotherapy Follows with oncology  Hip fracture-sustained fracture in April and went to rehab at [facility] until May.  He is now doing physical therapy at home.  He has been ambulating with his walker and a cane. Increase physical activity as tolerated  Disposition: Follow-up with Dr. Antoine Poche or me in 4-6 months.   Thomasene Ripple. Onyx Edgley NP-C     07/26/2022, 1:44 PM [facility] [address] Office [phone] Fax [phone]    I spent 14 minutes examining this patient, reviewing medications, and using patient centered shared decision making involving her cardiac care.  Prior to her visit I spent greater than 20 minutes reviewing her past medical history,  medications, and prior cardiac tests.

## 2022-07-24 ENCOUNTER — Ambulatory Visit (HOSPITAL_COMMUNITY)
Admission: RE | Admit: 2022-07-24 | Discharge: 2022-07-24 | Disposition: A | Discharge status: Home / Self Care | Payer: HMO | Source: Ambulatory Visit | Attending: Hematology | Admitting: Hematology

## 2022-07-24 ENCOUNTER — Other Ambulatory Visit (HOSPITAL_COMMUNITY): Payer: HMO

## 2022-07-24 ENCOUNTER — Encounter (HOSPITAL_COMMUNITY): Payer: Self-pay

## 2022-07-24 DIAGNOSIS — C3411 Malignant neoplasm of upper lobe, right bronchus or lung: Secondary | ICD-10-CM | POA: Insufficient documentation

## 2022-07-24 MED ORDER — HEPARIN SOD (PORK) LOCK FLUSH 100 UNIT/ML IV SOLN
500.0000 [IU] | Freq: Once | INTRAVENOUS | Status: AC
  Administered 2022-07-24: 500 [IU] via INTRAVENOUS

## 2022-07-24 MED ORDER — HEPARIN SOD (PORK) LOCK FLUSH 100 UNIT/ML IV SOLN
INTRAVENOUS | Status: AC
  Filled 2022-07-24: qty 5

## 2022-07-24 MED ORDER — IOHEXOL 300 MG/ML  SOLN
75.0000 mL | Freq: Once | INTRAMUSCULAR | Status: AC | PRN
  Administered 2022-07-24: 75 mL via INTRAVENOUS

## 2022-07-26 ENCOUNTER — Encounter: Payer: Self-pay | Admitting: General Practice

## 2022-07-26 ENCOUNTER — Ambulatory Visit: Payer: HMO | Attending: Student | Admitting: General Practice

## 2022-07-26 VITALS — BP 88/58 | HR 57 | Ht 71.0 in | Wt 150.8 lb

## 2022-07-26 DIAGNOSIS — I480 Paroxysmal atrial fibrillation: Secondary | ICD-10-CM

## 2022-07-26 DIAGNOSIS — I2510 Atherosclerotic heart disease of native coronary artery without angina pectoris: Secondary | ICD-10-CM | POA: Diagnosis not present

## 2022-07-26 DIAGNOSIS — Z8673 Personal history of transient ischemic attack (TIA), and cerebral infarction without residual deficits: Secondary | ICD-10-CM

## 2022-07-26 DIAGNOSIS — I5042 Chronic combined systolic (congestive) and diastolic (congestive) heart failure: Secondary | ICD-10-CM | POA: Diagnosis not present

## 2022-07-26 DIAGNOSIS — I10 Essential (primary) hypertension: Secondary | ICD-10-CM

## 2022-07-26 DIAGNOSIS — C3491 Malignant neoplasm of unspecified part of right bronchus or lung: Secondary | ICD-10-CM

## 2022-07-26 NOTE — Patient Instructions (Addendum)
Medication Instructions:  The current medical regimen is effective;  continue present plan and medications as directed. Please refer to the Current Medication list given to you today.  *If you need a refill on your cardiac medications before your next appointment, please call your pharmacy*   Lab Work: NONE If you have labs (blood work) drawn today and your tests are completely normal, you will receive your results only by: MyChart Message (if you have MyChart) OR A paper copy in the mail If you have any lab test that is abnormal or we need to change your treatment, we will call you to review the results.   Testing/Procedures: NONE   Follow-Up: At [facility], you and your health needs are our priority.  As part of our continuing mission to provide you with exceptional heart care, we have created designated Provider Care Teams.  These Care Teams include your primary Cardiologist (physician) and Advanced Practice Providers (APPs -  Physician Assistants and Nurse Practitioners) who all work together to provide you with the care you need, when you need it.  Your next appointment:   4-6 month(s)  Provider:   [name], MD  or Edd Fabian, FNP        Other Instructions TAKE AND LOG YOUR BLOOD PRESSURE, IF <80 CALL FOR FURTHER DIRECTION  ADD A LITTLE MORE SALT IN YOUR DIET INCREASE PHYSICAL ACTIVITY AS TOLERATED

## 2022-07-27 ENCOUNTER — Other Ambulatory Visit: Payer: Self-pay

## 2022-07-29 ENCOUNTER — Ambulatory Visit: Payer: HMO | Admitting: Hematology

## 2022-07-29 ENCOUNTER — Ambulatory Visit: Payer: HMO

## 2022-07-29 ENCOUNTER — Other Ambulatory Visit: Payer: HMO

## 2022-07-30 ENCOUNTER — Other Ambulatory Visit: Payer: Self-pay

## 2022-07-30 DIAGNOSIS — C3411 Malignant neoplasm of upper lobe, right bronchus or lung: Secondary | ICD-10-CM

## 2022-07-30 NOTE — Progress Notes (Signed)
[facility] [address], [place] [address]    Clinic Day:  07/30/2022  Referring physician: [name], DO  Patient Care Team: [name], DO as PCP - General (Family Medicine) [name], MD as PCP - Cardiology (Cardiology) [name], MD (Cardiology) [name], MD as Consulting Physician (Gastroenterology) [name], RN as Oncology Nurse Navigator (Medical Oncology) [name], MD as Medical Oncologist (Medical Oncology) [name], MD as Consulting Physician (Hematology)   ASSESSMENT & PLAN:   Assessment: 1. 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  Occasional additional small pulmonary nodules measuring 0.4 cm and smaller. - Bronchoscopy (06/11/2021): RUL nodule brushing and FNA: Malignant cells with features of adenocarcinoma. - PET scan (06/22/2021): Subsolid nodular lesion in the right upper lobe, hypermetabolic SUV 15.  Small right hilar and infrahilar lymph nodes with SUV 4.03 concerning for metastatic adenopathy.  Small hypermetabolic left parotid gland lesion, consistent with previous history of Wharton's tumor.  Hypermetabolic focus in the transverse colon SUV 8.69. - MRI of the brain (07/11/2021): No evidence of metastatic disease.  Right sphenoid wing meningioma stable since 2022. - He was evaluated by Dr. Dorris Fetch and was recommended to undergo neoadjuvant chemoimmunotherapy followed by surgical resection. - 3 cycles of carboplatin, pemetrexed from 08/06/2021 through 10/02/2021 (opdivo given with only cycle 2, discontinued for cycle 3 due to cardiac problems) - NGS testing not performed due to insufficient sample. - Guardant360: Negative for EGFR and ALK.  MSI high not detected. - Right upper lobectomy and lymph node dissection (11/12/2021): 1.2 cm invasive adenocarcinoma,  moderately differentiated, margins negative, pT1b pN0, 0/17 lymph nodes positive from stations 4R, 7, 8, 9, 10R, 11 R and 12 R.  Negative for visceral pleural involvement, LVI.  PD-L1 TPS is 1%. - Adjuvant pembrolizumab started on 03/11/2022.  Pembrolizumab held after 3 cycles due to fall and right fracture, s/p right hemiarthroplasty.   2. Social/family history: - Lives at home with his wife.  Uses cane occasionally after he had stroke in January 2022.  He has retired after working in [employer].  He is current active smoker, 1 pack/day for 60 years.  No exposure to chemicals. - Brother died of metastatic cancer.  Maternal uncle had lung cancer.    Plan: 1. 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 - Reviewed labs: Normal LFTs with albumin 3.1.  CBC grossly normal. - He will proceed with Keytruda today. - Previous CT chest on 04/19/2022 showed small pulmonary nodules in the lungs bilaterally, new compared to prior study.  We will obtain another CT scan of the chest prior to next visit in 3 weeks.  2.  Normocytic anemia: - He received Feraheme on 04/26/2022 and 06/13/2022.  Hemoglobin today is 9.9.  Will check his iron panel in the next few weeks.   3.  Hypertension/A-fib: - At last visit I decreased metoprolol from 75 mg daily to 50 mg daily.  Blood pressure today is 99/58 and heart rate is 63. - He was told to decrease his metoprolol to 25 mg daily if systolic blood pressure at home is staying below 100.   4.  Depression: - Continue Lexapro 10 mg daily.  Mood has improved.    No orders of the defined types were placed in this  encounter.     Alben Deeds Teague,acting as a Neurosurgeon for [name], MD.,have documented all relevant documentation on the behalf of [name], MD,as directed by  [name], MD while in the presence of [name], MD.  ***   Audubon R Teague   7/9/20249:09  PM  CHIEF COMPLAINT:   Diagnosis: right upper lobe lung adenocarcinoma    Cancer Staging  Primary adenocarcinoma of upper lobe of right lung [facility]) Staging form: Lung, AJCC 8th Edition - Clinical stage from 06/26/2021: cT1, cN1, cM0 - Unsigned    Prior Therapy: 1. 3 cycles Neoadjuvant chemoimmunotherapy with carboplatin, pemetrexed from 08/06/21 through 10/02/21 2. RUL lobectomy and LND on 10/23/203  Current Therapy:  Adjuvant pembrolizumab    HISTORY OF PRESENT ILLNESS:   Oncology History  Primary adenocarcinoma of upper lobe of right lung (HCC)  06/26/2021 Initial Diagnosis   Primary adenocarcinoma of upper lobe of right lung (HCC)   08/06/2021 - 08/27/2021 Chemotherapy   Patient is on Treatment Plan : LUNG NSCLC Pemetrexed + Carboplatin q21d x 4 Cycles     08/06/2021 - 10/02/2021 Chemotherapy   Patient is on Treatment Plan : LUNG NSCLC Pemetrexed + Carboplatin q21d x 4 Cycles     03/11/2022 -  Chemotherapy   Patient is on Treatment Plan : LUNG NSCLC Pembrolizumab (200) q21d        INTERVAL HISTORY:   Darryl Ward is a 76 y.o. male presenting to clinic today for follow up of right upper lobe lung adenocarcinoma. He was last seen by me on 07/10/22.  Since our last visit, patient had a CT of Chest w Contrast on 7/3 with results still pending.    Today, he states that he is doing well overall. His appetite level is at ***%. His energy level is at ***%.  PAST MEDICAL HISTORY:   Past Medical History: Past Medical History:  Diagnosis Date   AAA (abdominal aortic aneurysm) (HCC)    Arthritis    Cancer (HCC)    Carotid artery disease (HCC)    Nonobstructive   Cataract    COPD (chronic obstructive pulmonary disease) (HCC)    Coronary atherosclerosis of native coronary artery    PTCA small diagonal 2007 otherwise nonobstructive CAD   Depression    Dysrhythmia    Essential hypertension, benign    Hyperlipidemia    NSTEMI (non-ST elevated myocardial infarction) (HCC) 2007   Stroke  [facility]) 2004   TIA (transient ischemic attack) 2006    Surgical History: Past Surgical History:  Procedure Laterality Date   AORTA - BILATERAL FEMORAL ARTERY BYPASS GRAFT  01/08/2012   Procedure: AORTA BIFEMORAL BYPASS GRAFT;  Surgeon: Sherren Kerns, MD;  Location: [facility] OR;  Service: Vascular;  Laterality: Bilateral;  using 18x57mm x 40cm Hemashield Gold Vascular Graft with Endarterectomy, Thombectomy and  Reimplantation of Inferior Mesenteric Artery   BACK SURGERY  2021   BRONCHIAL BIOPSY  06/11/2021   Procedure: BRONCHIAL BIOPSIES;  Surgeon: Leslye Peer, MD;  Location: [facility] ENDOSCOPY;  Service: Pulmonary;;   BRONCHIAL BRUSHINGS  06/11/2021   Procedure: BRONCHIAL BRUSHINGS;  Surgeon: Leslye Peer, MD;  Location: [facility] ENDOSCOPY;  Service: Pulmonary;;   BRONCHIAL NEEDLE ASPIRATION BIOPSY  06/11/2021   Procedure: BRONCHIAL NEEDLE ASPIRATION BIOPSIES;  Surgeon: Leslye Peer, MD;  Location: [facility] ENDOSCOPY;  Service: Pulmonary;;   CARDIOVERSION N/A 09/20/2021   Procedure: CARDIOVERSION;  Surgeon: [name], MD;  Location: [facility] ORS;  Service: Cardiovascular;  Laterality: N/A;   COLONOSCOPY N/A 04/14/2019  Procedure: COLONOSCOPY;  Surgeon: Corbin Ade, MD;  Location: [facility] ENDO SUITE;  Service: Endoscopy;  Laterality: N/A;  9:30   COLONOSCOPY WITH PROPOFOL N/A 07/25/2021   Procedure: COLONOSCOPY WITH PROPOFOL;  Surgeon: Lanelle Bal, DO;  Location: [facility] ENDO SUITE;  Service: Endoscopy;  Laterality: N/A;  1:00pm   FIDUCIAL MARKER PLACEMENT  06/11/2021   Procedure: FIDUCIAL MARKER PLACEMENT;  Surgeon: Leslye Peer, MD;  Location[facility] END[facility]COPY;  Service: Pulmonary;;   HIP ARTHROPLASTY Right 04/28/2022   Procedure: ARTHROPLASTY BIPOLAR HIP (HEMIARTHROPLASTY);  Surgeon: Sheral Apley, MD;  Location: [facility] OR;  Service: Orthopedics;  Laterality: Right;   INTERCOSTAL NERVE BLOCK Right 11/12/2021   Procedure: INTERCOSTAL NERVE BLOCK;  Surgeon: Loreli Slot, MD;  Location: [facility] OR;   Service: Thoracic;  Laterality: Right;   IR IMAGING GUIDED PORT INSERTION  07/31/2021   Left cataract surgery     LYMPH NODE DISSECTION Right 11/12/2021   Procedure: LYMPH NODE DISSECTION;  Surgeon: Loreli Slot, MD;  Location: [facility] OR;  Service: Thoracic;  Laterality: Right;   POLYPECTOMY  04/14/2019   Procedure: POLYPECTOMY;  Surgeon: Corbin Ade, MD;  [facility] ENDO SUITE;  Service: Endoscopy;;   POLYPECTOMY  07/25/2021   Procedure: POLYPECTOMY;  Surgeon: Lanelle Bal, DO;  Location: [facility] ENDO SUITE;  Service: Endoscopy;;   TEE WITHOUT CARDIOVERSION N/A 09/20/2021   Procedure: TRANSESOPHAGEAL ECHOCARDIOGRAM (TEE);  Surgeon: [name], MD;  Location: [facility] ORS;  Service: Cardiovascular;  Laterality: N/A;   TRANSFORAMINAL LUMBAR INTERBODY FUSION (TLIF) WITH PEDICLE SCREW FIXATION 1 LEVEL N/A 04/27/2020   Procedure: Transforaminal Lumbar Interbody Fusion Lumbar Five-Sacral One;  Surgeon: Bedelia Person, MD;  Location: [facility] OR;  Service: Neurosurgery;  Laterality: N/A;   VIDEO BRONCHOSCOPY WITH INSERTION OF INTERBRONCHIAL VALVE (IBV) N/A 11/22/2021   Procedure: VIDEO BRONCHOSCOPY WITH INSERTION OF INTERBRONCHIAL VALVE (IBV);  Surgeon: Loreli Slot, MD;  Location: [facility] OR;  Service: Thoracic;  Laterality: N/A;   VIDEO BRONCHOSCOPY WITH INSERTION OF INTERBRONCHIAL VALVE (IBV) N/A 01/24/2022   Procedure: VIDEO BRONCHOSCOPY WITH REMOVAL OF INTERBRONCHIAL VALVE (IBV);  Surgeon: Loreli Slot, MD;  Location: [facility] OR;  Service: Thoracic;  Laterality: N/A;   VIDEO BRONCHOSCOPY WITH RADIAL ENDOBRONCHIAL ULTRASOUND  06/11/2021   Procedure: VIDEO BRONCHOSCOPY WITH RADIAL ENDOBRONCHIAL ULTRASOUND;  Surgeon: Leslye Peer, MD;  Location: [facility] ENDOSCOPY;  Service: Pulmonary;;    Social History: Social History   Socioeconomic History   Marital status: Divorced    Spouse name: Not on file   Number of children: 1   Years of education: 11   Highest education level: 11th  grade  Occupational History    Employer: Engineer, materials  Tobacco Use   Smoking status: Former    Packs/day: 1.00    Years: 40.00    Additional pack years: 0.00    Total pack years: 40.00    Types: Cigarettes    Quit date: 07/2021    Years since quitting: 1.0   Smokeless tobacco: Never   Tobacco comments:    1 pack of cigarettes smoked daily. 07/17/21 ARJ, RN   Vaping Use   Vaping Use: Never used  Substance and Sexual Activity   Alcohol use: No    Comment: Prior history of regular alcohol use   Drug use: No   Sexual activity: Not Currently  Other Topics Concern   Not on file  Social History Narrative   Not on file   Social Determinants of Health  Financial Resource Strain: High Risk (06/11/2022)   Overall Financial Resource Strain (CARDIA)    Difficulty of Paying Living Expenses: Hard  Food Insecurity: Patient Declined (06/11/2022)   Hunger Vital Sign    Worried About Running Out of Food in the Last Year: Patient declined    Ran Out of Food in the Last Year: Patient declined  Transportation Needs: Unmet Transportation Needs (06/11/2022)   PRAPARE - Administrator, Civil Service (Medical): Yes    Lack of Transportation (Non-Medical): Yes  Physical Activity: Unknown (06/11/2022)   Exercise Vital Sign    Days of Exercise per Week: 0 days    Minutes of Exercise per Session: Not on file  Stress: No Stress Concern Present (06/11/2022)   [employer] of Occupational Health - Occupational Stress Questionnaire    Feeling of Stress : Not at all  Social Connections: Socially Isolated (06/11/2022)   Social Connection and Isolation Panel [NHANES]    Frequency of Communication with Friends and Family: Once a week    Frequency of Social Gatherings with Friends and Family: Never    Attends Religious Services: Never    Database administrator or Organizations: No    Attends Engineer, structural: Not on file    Marital Status: Divorced  Intimate Partner  Violence: Not At Risk (05/03/2022)   Humiliation, Afraid, Rape, and Kick questionnaire    Fear of Current or Ex-Partner: No    Emotionally Abused: No    Physically Abused: No    Sexually Abused: No    Family History: Family History  Problem Relation Age of Onset   Hyperlipidemia Sister    Heart attack Brother 98   Cancer - Colon Neg Hx     Current Medications:  Current Outpatient Medications:    acetaminophen (TYLENOL) 500 MG tablet, GIVE 2 TABLETS (1,000 mg TOTAL) BY MOUTH EVERY 6 HOURS AS NEEDED FOR PAIN, Disp: 60 tablet, Rfl: 0   albuterol (VENTOLIN HFA) 108 (90 Base) MCG/ACT inhaler, Inhale 2 puffs into the lungs every 6 (six) hours as needed for wheezing or shortness of breath., Disp: 6.7 g, Rfl: 6   apixaban (ELIQUIS) 5 MG TABS tablet, Take 1 tablet (5 mg total) by mouth 2 (two) times daily., Disp: 180 tablet, Rfl: 3   atorvastatin (LIPITOR) 80 MG tablet, Take 1 tablet (80 mg total) by mouth daily., Disp: 90 tablet, Rfl: 3   cyclobenzaprine (FLEXERIL) 10 MG tablet, Take 1 tablet (10 mg total) by mouth 2 (two) times daily as needed. (Patient not taking: Reported on 07/26/2022), Disp: 60 tablet, Rfl: 2   escitalopram (LEXAPRO) 10 MG tablet, Take 1 tablet (10 mg total) by mouth daily., Disp: 90 tablet, Rfl: 3   folic acid (KP FOLIC ACID) 1 MG tablet, Take 1 tablet (1 mg total) by mouth daily., Disp: 90 tablet, Rfl: 3   furosemide (LASIX) 20 MG tablet, Take 1 tablet (20 mg total) by mouth daily as needed., Disp: 90 tablet, Rfl: 2   HYDROcodone-acetaminophen (NORCO/VICODIN) 5-325 MG tablet, Take 1 tablet by mouth every 6 (six) hours as needed, for pain. (Patient not taking: Reported on 07/26/2022), Disp: 60 tablet, Rfl: 0   magnesium oxide (MAG-OX) 400 (240 Mg) MG tablet, Take 1 tablet (400 mg total) by mouth daily., Disp: 120 tablet, Rfl: 3   metoprolol succinate (TOPROL-XL) 25 MG 24 hr tablet, Take 3 tablets (75 mg total) by mouth daily. Take with or immediately following a meal. Do Not  Crush. (Patient taking  differently: Take 50 mg by mouth daily.), Disp: 270 tablet, Rfl: 3   pantoprazole (PROTONIX) 40 MG tablet, Take 1 tablet (40 mg total) by mouth daily., Disp: 90 tablet, Rfl: 3   tamsulosin (FLOMAX) 0.4 MG CAPS capsule, Take 1 capsule (0.4 mg total) by mouth daily., Disp: 90 capsule, Rfl: 3   Tiotropium Bromide Monohydrate (SPIRIVA RESPIMAT) 2.5 MCG/ACT AERS, Inhale 2 puffs into the lungs daily., Disp: 18 g, Rfl: 3 No current facility-administered medications for this visit.  Facility-Administered Medications Ordered in Other Visits:    sodium chloride flush (NS) 0.9 % injection 10 mL, 10 mL, Intracatheter, PRN, [name], MD, 10 mL at 07/10/22 1513   Allergies: No Known Allergies  REVIEW OF SYSTEMS:   Review of Systems  Constitutional:  Negative for chills, fatigue and fever.  HENT:   Negative for lump/mass, mouth sores, nosebleeds, sore throat and trouble swallowing.   Eyes:  Negative for eye problems.  Respiratory:  Negative for cough and shortness of breath.   Cardiovascular:  Negative for chest pain, leg swelling and palpitations.  Gastrointestinal:  Negative for abdominal pain, constipation, diarrhea, nausea and vomiting.  Genitourinary:  Negative for bladder incontinence, difficulty urinating, dysuria, frequency, hematuria and nocturia.   Musculoskeletal:  Negative for arthralgias, back pain, flank pain, myalgias and neck pain.  Skin:  Negative for itching and rash.  Neurological:  Negative for dizziness, headaches and numbness.  Hematological:  Does not bruise/bleed easily.  Psychiatric/Behavioral:  Negative for depression, sleep disturbance and suicidal ideas. The patient is not nervous/anxious.   All other systems reviewed and are negative.    VITALS:   There were no vitals taken for this visit.  Wt Readings from Last 3 Encounters:  07/26/22 150 lb 12.8 oz (68.4 kg)  07/11/22 151 lb 12.8 oz (68.9 kg)  07/10/22 151 lb 9.6 oz (68.8 kg)     There is no height or weight on file to calculate BMI.  Performance status (ECOG): 1 - Symptomatic but completely ambulatory  PHYSICAL EXAM:   Physical Exam Vitals and nursing note reviewed. Exam conducted with a chaperone present.  Constitutional:      Appearance: Normal appearance.  Cardiovascular:     Rate and Rhythm: Normal rate and regular rhythm.     Pulses: Normal pulses.     Heart sounds: Normal heart sounds.  Pulmonary:     Effort: Pulmonary effort is normal.     Breath sounds: Normal breath sounds.  Abdominal:     Palpations: Abdomen is soft. There is no hepatomegaly, splenomegaly or mass.     Tenderness: There is no abdominal tenderness.  Musculoskeletal:     Right lower leg: No edema.     Left lower leg: No edema.  Lymphadenopathy:     Cervical: No cervical adenopathy.     Right cervical: No superficial, deep or posterior cervical adenopathy.    Left cervical: No superficial, deep or posterior cervical adenopathy.     Upper Body:     Right upper body: No supraclavicular or axillary adenopathy.     Left upper body: No supraclavicular or axillary adenopathy.  Neurological:     General: No focal deficit present.     Mental Status: He is alert and oriented to person, place, and time.  Psychiatric:        Mood and Affect: Mood normal.        Behavior: Behavior normal.     LABS:      Latest Ref Rng & Units  07/10/2022   11:48 AM 06/13/2022    8:13 AM 06/12/2022    4:20 PM  CBC  WBC 4.0 - 10.5 K/uL 6.3  6.4  6.7   Hemoglobin 13.0 - 17.0 g/dL 9.9  9.7  16.1   Hematocrit 39.0 - 52.0 % 30.6  29.4  29.8   Platelets 150 - 400 K/uL 120  125  135       Latest Ref Rng & Units 07/10/2022   11:48 AM 06/13/2022    8:13 AM 06/12/2022    4:20 PM  CMP  Glucose 70 - 99 mg/dL 096  045  409   BUN 8 - 23 mg/dL 14  14  15    Creatinine 0.61 - 1.24 mg/dL 8.11  9.14  7.82   Sodium 135 - 145 mmol/L 134  138  140   Potassium 3.5 - 5.1 mmol/L 3.6  3.7  4.7   Chloride 98 - 111  mmol/L 103  103  102   CO2 22 - 32 mmol/L 25  25  23    Calcium 8.9 - 10.3 mg/dL 8.6  9.0  9.4   Total Protein 6.5 - 8.1 g/dL 6.2  6.3  6.4   Total Bilirubin 0.3 - 1.2 mg/dL 0.8  0.6  0.6   Alkaline Phos 38 - 126 U/L 52  63  76   AST 15 - 41 U/L 22  26  23    ALT 0 - 44 U/L 15  17  13       No results found for: "CEA1", "CEA" / No results found for: "CEA1", "CEA" Lab Results  Component Value Date   PSA1 1.2 11/25/2019   No results found for: "NFA213" No results found for: "CAN125"  No results found for: "TOTALPROTELP", "ALBUMINELP", "A1GS", "A2GS", "BETS", "BETA2SER", "GAMS", "MSPIKE", "SPEI" Lab Results  Component Value Date   TIBC 253 06/13/2022   TIBC 291 04/01/2022   TIBC 297 03/11/2022   FERRITIN 501 (H) 06/13/2022   FERRITIN 86 04/01/2022   FERRITIN 117 03/11/2022   IRONPCTSAT 24 06/13/2022   IRONPCTSAT 22 04/01/2022   IRONPCTSAT 24 03/11/2022   No results found for: "LDH"   STUDIES:   DG Chest 2 View  Result Date: 07/09/2022 CLINICAL DATA:  Lung carcinoma EXAM: CHEST - 2 VIEW COMPARISON:  Previous chest radiographs including the study done on 02/05/2022, CT chest done on 04/19/2022 FINDINGS: There is previous partial removal of right lung. There is pleural density in the right apex, possibly loculated effusion or pleural thickening. There is interval clearing of loculated air in right apex since the previous studies. There are no signs of alveolar pulmonary edema or focal pulmonary consolidation. Costophrenic angles are clear. There is no pneumothorax. Tip of chest port is seen in superior vena cava. IMPRESSION: There are no signs of pulmonary edema or focal pulmonary consolidation. There is partial resection of right lung. There is right apical opacity which may be suggestive of loculated pleural effusion or scarring. There is interval clearing of loculated pocket of air seen in the right apex since the previous studies. Electronically Signed   By: Ernie Avena M.D.    On: 07/09/2022 11:13

## 2022-07-31 ENCOUNTER — Inpatient Hospital Stay (HOSPITAL_BASED_OUTPATIENT_CLINIC_OR_DEPARTMENT_OTHER): Payer: HMO | Admitting: Hematology

## 2022-07-31 ENCOUNTER — Inpatient Hospital Stay: Payer: HMO | Attending: Hematology

## 2022-07-31 ENCOUNTER — Inpatient Hospital Stay: Payer: HMO

## 2022-07-31 ENCOUNTER — Ambulatory Visit: Payer: PPO | Admitting: Nurse Practitioner

## 2022-07-31 VITALS — BP 110/63 | HR 62 | Temp 97.5°F | Resp 16

## 2022-07-31 DIAGNOSIS — Z79899 Other long term (current) drug therapy: Secondary | ICD-10-CM | POA: Insufficient documentation

## 2022-07-31 DIAGNOSIS — Z801 Family history of malignant neoplasm of trachea, bronchus and lung: Secondary | ICD-10-CM | POA: Diagnosis not present

## 2022-07-31 DIAGNOSIS — E785 Hyperlipidemia, unspecified: Secondary | ICD-10-CM | POA: Diagnosis not present

## 2022-07-31 DIAGNOSIS — I4891 Unspecified atrial fibrillation: Secondary | ICD-10-CM | POA: Diagnosis not present

## 2022-07-31 DIAGNOSIS — J449 Chronic obstructive pulmonary disease, unspecified: Secondary | ICD-10-CM | POA: Diagnosis not present

## 2022-07-31 DIAGNOSIS — I700 Atherosclerosis of aorta: Secondary | ICD-10-CM | POA: Insufficient documentation

## 2022-07-31 DIAGNOSIS — Z7901 Long term (current) use of anticoagulants: Secondary | ICD-10-CM | POA: Insufficient documentation

## 2022-07-31 DIAGNOSIS — I252 Old myocardial infarction: Secondary | ICD-10-CM | POA: Diagnosis not present

## 2022-07-31 DIAGNOSIS — Z9221 Personal history of antineoplastic chemotherapy: Secondary | ICD-10-CM | POA: Diagnosis not present

## 2022-07-31 DIAGNOSIS — J439 Emphysema, unspecified: Secondary | ICD-10-CM | POA: Insufficient documentation

## 2022-07-31 DIAGNOSIS — I10 Essential (primary) hypertension: Secondary | ICD-10-CM | POA: Diagnosis not present

## 2022-07-31 DIAGNOSIS — Z8673 Personal history of transient ischemic attack (TIA), and cerebral infarction without residual deficits: Secondary | ICD-10-CM | POA: Diagnosis not present

## 2022-07-31 DIAGNOSIS — D509 Iron deficiency anemia, unspecified: Secondary | ICD-10-CM | POA: Diagnosis not present

## 2022-07-31 DIAGNOSIS — C3411 Malignant neoplasm of upper lobe, right bronchus or lung: Secondary | ICD-10-CM

## 2022-07-31 DIAGNOSIS — D649 Anemia, unspecified: Secondary | ICD-10-CM | POA: Insufficient documentation

## 2022-07-31 DIAGNOSIS — I714 Abdominal aortic aneurysm, without rupture, unspecified: Secondary | ICD-10-CM | POA: Insufficient documentation

## 2022-07-31 DIAGNOSIS — D696 Thrombocytopenia, unspecified: Secondary | ICD-10-CM | POA: Diagnosis not present

## 2022-07-31 DIAGNOSIS — Z5112 Encounter for antineoplastic immunotherapy: Secondary | ICD-10-CM | POA: Insufficient documentation

## 2022-07-31 DIAGNOSIS — I2510 Atherosclerotic heart disease of native coronary artery without angina pectoris: Secondary | ICD-10-CM | POA: Insufficient documentation

## 2022-07-31 DIAGNOSIS — Z87891 Personal history of nicotine dependence: Secondary | ICD-10-CM | POA: Insufficient documentation

## 2022-07-31 LAB — CBC WITH DIFFERENTIAL/PLATELET
Abs Immature Granulocytes: 0.03 10*3/uL (ref 0.00–0.07)
Basophils Absolute: 0 10*3/uL (ref 0.0–0.1)
Basophils Relative: 1 %
Eosinophils Absolute: 0.2 10*3/uL (ref 0.0–0.5)
Eosinophils Relative: 3 %
HCT: 32.2 % — ABNORMAL LOW (ref 39.0–52.0)
Hemoglobin: 10.5 g/dL — ABNORMAL LOW (ref 13.0–17.0)
Immature Granulocytes: 1 %
Lymphocytes Relative: 22 %
Lymphs Abs: 1.3 10*3/uL (ref 0.7–4.0)
MCH: 32.2 pg (ref 26.0–34.0)
MCHC: 32.6 g/dL (ref 30.0–36.0)
MCV: 98.8 fL (ref 80.0–100.0)
Monocytes Absolute: 0.4 10*3/uL (ref 0.1–1.0)
Monocytes Relative: 7 %
Neutro Abs: 3.8 10*3/uL (ref 1.7–7.7)
Neutrophils Relative %: 66 %
Platelets: 124 10*3/uL — ABNORMAL LOW (ref 150–400)
RBC: 3.26 MIL/uL — ABNORMAL LOW (ref 4.22–5.81)
RDW: 14.2 % (ref 11.5–15.5)
WBC: 5.8 10*3/uL (ref 4.0–10.5)
nRBC: 0 % (ref 0.0–0.2)

## 2022-07-31 LAB — COMPREHENSIVE METABOLIC PANEL
ALT: 17 U/L (ref 0–44)
AST: 23 U/L (ref 15–41)
Albumin: 3.1 g/dL — ABNORMAL LOW (ref 3.5–5.0)
Alkaline Phosphatase: 83 U/L (ref 38–126)
Anion gap: 7 (ref 5–15)
BUN: 13 mg/dL (ref 8–23)
CO2: 23 mmol/L (ref 22–32)
Calcium: 8.7 mg/dL — ABNORMAL LOW (ref 8.9–10.3)
Chloride: 106 mmol/L (ref 98–111)
Creatinine, Ser: 0.98 mg/dL (ref 0.61–1.24)
GFR, Estimated: >60 mL/min (ref >60)
Glucose, Bld: 112 mg/dL — ABNORMAL HIGH (ref 70–99)
Potassium: 4 mmol/L (ref 3.5–5.1)
Sodium: 136 mmol/L (ref 135–145)
Total Bilirubin: 0.3 mg/dL (ref 0.3–1.2)
Total Protein: 6.2 g/dL — ABNORMAL LOW (ref 6.5–8.1)

## 2022-07-31 LAB — TSH: TSH: 2.422 u[IU]/mL (ref 0.350–4.500)

## 2022-07-31 LAB — MAGNESIUM: Magnesium: 1.8 mg/dL (ref 1.7–2.4)

## 2022-07-31 MED ORDER — SODIUM CHLORIDE 0.9 % IV SOLN
200.0000 mg | Freq: Once | INTRAVENOUS | Status: AC
  Administered 2022-07-31: 200 mg via INTRAVENOUS
  Filled 2022-07-31: qty 8

## 2022-07-31 MED ORDER — SODIUM CHLORIDE 0.9% FLUSH
10.0000 mL | Freq: Once | INTRAVENOUS | Status: AC
  Administered 2022-07-31: 10 mL via INTRAVENOUS

## 2022-07-31 MED ORDER — SODIUM CHLORIDE 0.9 % IV SOLN: Freq: Once | INTRAVENOUS | Status: AC

## 2022-07-31 MED ORDER — SODIUM CHLORIDE 0.9% FLUSH
10.0000 mL | INTRAVENOUS | Status: DC | PRN
  Administered 2022-07-31: 10 mL

## 2022-07-31 MED ORDER — HEPARIN SOD (PORK) LOCK FLUSH 100 UNIT/ML IV SOLN
500.0000 [IU] | Freq: Once | INTRAVENOUS | Status: AC | PRN
  Administered 2022-07-31: 500 [IU]

## 2022-07-31 NOTE — Patient Instructions (Signed)
[facility] [place]  Discharge Instructions: Thank you for choosing [facility] to provide your oncology and hematology care.  If you have a lab appointment with the Cancer Center - please note that after April 8th, 2024, all labs will be drawn in the cancer center.  You do not have to check in or register with the main entrance as you have in the past but will complete your check-in in the cancer center.  Wear comfortable clothing and clothing appropriate for easy access to any Portacath or PICC line.   We strive to give you quality time with your provider. You may need to reschedule your appointment if you arrive late (15 or more minutes).  Arriving late affects you and other patients whose appointments are after yours.  Also, if you miss three or more appointments without notifying the office, you may be dismissed from the clinic at the provider's discretion.      For prescription refill requests, have your pharmacy contact our office and allow 72 hours for refills to be completed.  To help prevent nausea and vomiting after your treatment, we encourage you to take your nausea medication as directed.  BELOW ARE SYMPTOMS THAT SHOULD BE REPORTED IMMEDIATELY: *FEVER GREATER THAN 100.4 F (38 C) OR HIGHER *CHILLS OR SWEATING *NAUSEA AND VOMITING THAT IS NOT CONTROLLED WITH YOUR NAUSEA MEDICATION *UNUSUAL SHORTNESS OF BREATH *UNUSUAL BRUISING OR BLEEDING *URINARY PROBLEMS (pain or burning when urinating, or frequent urination) *BOWEL PROBLEMS (unusual diarrhea, constipation, pain near the anus) TENDERNESS IN MOUTH AND THROAT WITH OR WITHOUT PRESENCE OF ULCERS (sore throat, sores in mouth, or a toothache) UNUSUAL RASH, SWELLING OR PAIN  UNUSUAL VAGINAL DISCHARGE OR ITCHING   Items with * indicate a potential emergency and should be followed up as soon as possible or go to the Emergency Department if any problems should occur.  Please show the CHEMOTHERAPY ALERT CARD or  IMMUNOTHERAPY ALERT CARD at check-in to the Emergency Department and triage nurse.  Should you have questions after your visit or need to cancel or reschedule your appointment, please contact [facility] [place] [facility] [phone]  and follow the prompts.  Office hours are 8:00 a.m. to 4:30 p.m. Monday - Friday. Please note that voicemails left after 4:00 p.m. may not be returned until the following business day.  We are closed weekends and major holidays. You have access to a nurse at all times for urgent questions. Please call the main number to the clinic [phone] and follow the prompts.  For any non-urgent questions, you may also contact your provider using MyChart. We now offer e-Visits for anyone 18 and older to request care online for non-urgent symptoms. For details visit [email or web address].   Also download the MyChart app! Go to the app store, search "MyChart", open the app, select [facility], and log in with your MyChart username and password.   

## 2022-07-31 NOTE — Progress Notes (Signed)
Patient has been examined by Dr. Katragadda. Vital signs and labs have been reviewed by MD - ANC, Creatinine, LFTs, hemoglobin, and platelets are within treatment parameters per M.D. - pt may proceed with treatment.  Primary RN and pharmacy notified.  

## 2022-07-31 NOTE — Progress Notes (Signed)
Patient tolerated chemotherapy with no complaints voiced.  Side effects with management reviewed with understanding verbalized.  Port site clean and dry with no bruising or swelling noted at site.  Good blood return noted before and after administration of chemotherapy.  Band aid applied.  Patient left in satisfactory condition with VSS and no s/s of distress noted.   

## 2022-07-31 NOTE — Patient Instructions (Signed)
[facility] at [facility] Discharge Instructions   You were seen and examined today by Dr. Ellin Saba.  He reviewed the results of the CT scan of your chest. There is no evidence of cancer on this exam.   He reviewed the results of your lab work which are normal/stable.   We will proceed with your treatment today.   Return as scheduled.    Thank you for choosing [facility] at [facility] to provide your oncology and hematology care.  To afford each patient quality time with our provider, please arrive at least 15 minutes before your scheduled appointment time.   If you have a lab appointment with the Cancer Center please come in thru the Main Entrance and check in at the main information desk.  You need to re-schedule your appointment should you arrive 10 or more minutes late.  We strive to give you quality time with our providers, and arriving late affects you and other patients whose appointments are after yours.  Also, if you no show three or more times for appointments you may be dismissed from the clinic at the providers discretion.     Again, thank you for choosing [facility].  Our hope is that these requests will decrease the amount of time that you wait before being seen by our physicians.       _____________________________________________________________  Should you have questions after your visit to [facility], please contact our office at [phone] and follow the prompts.  Our office hours are 8:00 a.m. and 4:30 p.m. Monday - Friday.  Please note that voicemails left after 4:00 p.m. may not be returned until the following business day.  We are closed weekends and major holidays.  You do have access to a nurse 24-7, just call the main number to the clinic [phone] and do not press any options, hold on the line and a nurse will answer the phone.    For prescription refill requests, have your pharmacy  contact our office and allow 72 hours.    Due to Covid, you will need to wear a mask upon entering the hospital. If you do not have a mask, a mask will be given to you at the Main Entrance upon arrival. For doctor visits, patients may have 1 support person age 58 or older with them. For treatment visits, patients can not have anyone with them due to social distancing guidelines and our immunocompromised population.

## 2022-07-31 NOTE — Progress Notes (Signed)
Labs reviewed with MD today. Ok to treat per MD.  

## 2022-08-06 ENCOUNTER — Other Ambulatory Visit: Payer: Self-pay

## 2022-08-21 ENCOUNTER — Inpatient Hospital Stay (HOSPITAL_BASED_OUTPATIENT_CLINIC_OR_DEPARTMENT_OTHER): Payer: HMO | Admitting: Hematology

## 2022-08-21 ENCOUNTER — Inpatient Hospital Stay: Payer: HMO

## 2022-08-21 VITALS — BP 119/68 | HR 52 | Temp 96.7°F | Resp 18 | Wt 159.8 lb

## 2022-08-21 DIAGNOSIS — D509 Iron deficiency anemia, unspecified: Secondary | ICD-10-CM

## 2022-08-21 DIAGNOSIS — C3411 Malignant neoplasm of upper lobe, right bronchus or lung: Secondary | ICD-10-CM | POA: Diagnosis not present

## 2022-08-21 DIAGNOSIS — Z5112 Encounter for antineoplastic immunotherapy: Secondary | ICD-10-CM | POA: Diagnosis not present

## 2022-08-21 LAB — CBC WITH DIFFERENTIAL/PLATELET
Abs Immature Granulocytes: 0.02 10*3/uL (ref 0.00–0.07)
Basophils Absolute: 0.1 10*3/uL (ref 0.0–0.1)
Basophils Relative: 1 %
Eosinophils Absolute: 0.2 10*3/uL (ref 0.0–0.5)
Eosinophils Relative: 3 %
HCT: 31.7 % — ABNORMAL LOW (ref 39.0–52.0)
Hemoglobin: 10.3 g/dL — ABNORMAL LOW (ref 13.0–17.0)
Immature Granulocytes: 0 %
Lymphocytes Relative: 20 %
Lymphs Abs: 1.1 10*3/uL (ref 0.7–4.0)
MCH: 32.5 pg (ref 26.0–34.0)
MCHC: 32.5 g/dL (ref 30.0–36.0)
MCV: 100 fL (ref 80.0–100.0)
Monocytes Absolute: 0.4 10*3/uL (ref 0.1–1.0)
Monocytes Relative: 7 %
Neutro Abs: 3.8 10*3/uL (ref 1.7–7.7)
Neutrophils Relative %: 69 %
Platelets: 126 10*3/uL — ABNORMAL LOW (ref 150–400)
RBC: 3.17 MIL/uL — ABNORMAL LOW (ref 4.22–5.81)
RDW: 14.3 % (ref 11.5–15.5)
WBC: 5.6 10*3/uL (ref 4.0–10.5)
nRBC: 0 % (ref 0.0–0.2)

## 2022-08-21 LAB — COMPREHENSIVE METABOLIC PANEL
ALT: 16 U/L (ref 0–44)
AST: 21 U/L (ref 15–41)
Albumin: 3.2 g/dL — ABNORMAL LOW (ref 3.5–5.0)
Alkaline Phosphatase: 62 U/L (ref 38–126)
Anion gap: 9 (ref 5–15)
BUN: 12 mg/dL (ref 8–23)
CO2: 23 mmol/L (ref 22–32)
Calcium: 8.9 mg/dL (ref 8.9–10.3)
Chloride: 104 mmol/L (ref 98–111)
Creatinine, Ser: 1.03 mg/dL (ref 0.61–1.24)
GFR, Estimated: >60 mL/min (ref >60)
Glucose, Bld: 116 mg/dL — ABNORMAL HIGH (ref 70–99)
Potassium: 3.8 mmol/L (ref 3.5–5.1)
Sodium: 136 mmol/L (ref 135–145)
Total Bilirubin: 0.6 mg/dL (ref 0.3–1.2)
Total Protein: 6.1 g/dL — ABNORMAL LOW (ref 6.5–8.1)

## 2022-08-21 LAB — IRON AND TIBC
Iron: 87 ug/dL (ref 45–182)
Saturation Ratios: 37 % (ref 17.9–39.5)
TIBC: 233 ug/dL — ABNORMAL LOW (ref 250–450)
UIBC: 146 ug/dL

## 2022-08-21 LAB — FERRITIN: Ferritin: 372 ng/mL — ABNORMAL HIGH (ref 24–336)

## 2022-08-21 LAB — TSH: TSH: 1.209 u[IU]/mL (ref 0.350–4.500)

## 2022-08-21 LAB — MAGNESIUM: Magnesium: 1.8 mg/dL (ref 1.7–2.4)

## 2022-08-21 MED ORDER — SODIUM CHLORIDE 0.9% FLUSH
10.0000 mL | INTRAVENOUS | Status: DC | PRN
  Administered 2022-08-21: 10 mL

## 2022-08-21 MED ORDER — SODIUM CHLORIDE 0.9 % IV SOLN
200.0000 mg | Freq: Once | INTRAVENOUS | Status: AC
  Administered 2022-08-21: 200 mg via INTRAVENOUS
  Filled 2022-08-21: qty 8

## 2022-08-21 MED ORDER — SODIUM CHLORIDE 0.9% FLUSH
10.0000 mL | Freq: Once | INTRAVENOUS | Status: AC
  Administered 2022-08-21: 10 mL via INTRAVENOUS

## 2022-08-21 MED ORDER — SODIUM CHLORIDE 0.9 % IV SOLN: Freq: Once | INTRAVENOUS | Status: AC

## 2022-08-21 MED ORDER — HEPARIN SOD (PORK) LOCK FLUSH 100 UNIT/ML IV SOLN
500.0000 [IU] | Freq: Once | INTRAVENOUS | Status: AC | PRN
  Administered 2022-08-21: 500 [IU]

## 2022-08-21 NOTE — Progress Notes (Signed)
[facility] [address] [place] [address]    Clinic Day:  08/21/22  Referring physician: [name], DO  Patient Care Team: [name], DO as PCP - General (Family Medicine) [name], MD as PCP - Cardiology (Cardiology) [name], MD (Cardiology) [name], MD as Consulting Physician (Gastroenterology) [name], RN as Oncology Nurse Navigator (Medical Oncology) [name], MD as Medical Oncologist (Medical Oncology) [name], MD as Consulting Physician (Hematology)   ASSESSMENT & PLAN:   Assessment: 1. 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  Occasional additional small pulmonary nodules measuring 0.4 cm and smaller. - Bronchoscopy (06/11/2021): RUL nodule brushing and FNA: Malignant cells with features of adenocarcinoma. - PET scan (06/22/2021): Subsolid nodular lesion in the right upper lobe, hypermetabolic SUV 15.  Small right hilar and infrahilar lymph nodes with SUV 4.03 concerning for metastatic adenopathy.  Small hypermetabolic left parotid gland lesion, consistent with previous history of Wharton's tumor.  Hypermetabolic focus in the transverse colon SUV 8.69. - MRI of the brain (07/11/2021): No evidence of metastatic disease.  Right sphenoid wing meningioma stable since 2022. - He was evaluated by Dr. Dorris Fetch and was recommended to undergo neoadjuvant chemoimmunotherapy followed by surgical resection. - 3 cycles of carboplatin, pemetrexed from 08/06/2021 through 10/02/2021 (opdivo given with only cycle 2, discontinued for cycle 3 due to cardiac problems) - NGS testing not performed due to insufficient sample. - Guardant360: Negative for EGFR and ALK.  MSI high not detected. - Right upper lobectomy and lymph node dissection (11/12/2021): 1.2 cm invasive adenocarcinoma,  moderately differentiated, margins negative, pT1b pN0, 0/17 lymph nodes positive from stations 4R, 7, 8, 9, 10R, 11 R and 12 R.  Negative for visceral pleural involvement, LVI.  PD-L1 TPS is 1%. - Adjuvant pembrolizumab started on 03/11/2022.  Pembrolizumab held after 3 cycles due to fall and right fracture, s/p right hemiarthroplasty.   2. Social/family history: - Lives at home with his wife.  Uses cane occasionally after he had stroke in January 2022.  He has retired after working in [employer].  He is current active smoker, 1 pack/day for 60 years.  No exposure to chemicals. - Brother died of metastatic cancer.  Maternal uncle had lung cancer.    Plan: 1. 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 - He is feeling better and is eating better.  He has gained 7 pounds since last visit. - Reviewed labs today: Normal LFTs and creatinine.  CBC grossly normal with mild thrombocytopenia and anemia.  TSH is 1.2. - Proceed with Keytruda today and in 3 weeks.  RTC 6 weeks for follow-up.  2.  Normocytic anemia: - Last Feraheme on 06/13/2022. - Ferritin is 372 and percent saturation 37.  Hemoglobin 10.3.  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   3.  Hypertension/A-fib: - Continue metoprolol 25 mg daily.   4.  Depression: - Continue Lexapro 10 mg daily.    Orders Placed This Encounter  Procedures   Magnesium    Standing Status:   Future    Standing Expiration Date:   12/02/2023   CBC with Differential    Standing Status:   Future    Standing Expiration Date:   12/02/2023   Comprehensive metabolic panel    Standing  Status:   Future    Standing Expiration Date:   12/02/2023      Alben Deeds Teague,acting as a scribe for [name], MD.,have documented all relevant documentation on the behalf of [name], MD,as directed by  [name], MD while in the presence of [name],  MD.  I, [name] MD, have reviewed the above documentation for accuracy and completeness, and I agree with the above.     [name], MD   7/31/20245:29 PM  CHIEF COMPLAINT:   Diagnosis: right upper lobe lung adenocarcinoma    Cancer Staging  Primary adenocarcinoma of upper lobe of right lung [facility]) Staging form: Lung, AJCC 8th Edition - Clinical stage from 06/26/2021: cT1, cN1, cM0 - Unsigned    Prior Therapy: 1. 3 cycles Neoadjuvant chemoimmunotherapy with carboplatin, pemetrexed from 08/06/21 through 10/02/21 2. RUL lobectomy and LND on 10/23/203  Current Therapy:  Adjuvant pembrolizumab    HISTORY OF PRESENT ILLNESS:   Oncology History  Primary adenocarcinoma of upper lobe of right lung (HCC)  06/26/2021 Initial Diagnosis   Primary adenocarcinoma of upper lobe of right lung (HCC)   08/06/2021 - 08/27/2021 Chemotherapy   Patient is on Treatment Plan : LUNG NSCLC Pemetrexed + Carboplatin q21d x 4 Cycles     08/06/2021 - 10/02/2021 Chemotherapy   Patient is on Treatment Plan : LUNG NSCLC Pemetrexed + Carboplatin q21d x 4 Cycles     03/11/2022 -  Chemotherapy   Patient is on Treatment Plan : LUNG NSCLC Pembrolizumab (200) q21d        INTERVAL HISTORY:   Darryl Ward is a 77 y.o. male presenting to clinic today for follow up of right upper lobe lung adenocarcinoma. He was last seen by me on 07/31/22.  Today, he states that he is doing well overall. His appetite level is at 100%. His energy level is at 75%. Patient is accompanied by his wife.   His wife notes he is eating well, exercising, walking, and has improved energy since last treatment. He has gained 7 pounds since his last visit. He denies any dry cough or SOB. He is taking Lexapro and metoprolol as prescribed. He is finished with physical therapy. His wife notes he is going to get a colonoscopy scheduled next week.   PAST MEDICAL HISTORY:   Past Medical History: Past Medical History:  Diagnosis Date    AAA (abdominal aortic aneurysm) (HCC)    Arthritis    Cancer (HCC)    Carotid artery disease (HCC)    Nonobstructive   Cataract    COPD (chronic obstructive pulmonary disease) (HCC)    Coronary atherosclerosis of native coronary artery    PTCA small diagonal 2007 otherwise nonobstructive CAD   Depression    Dysrhythmia    Essential hypertension, benign    Hyperlipidemia    NSTEMI (non-ST elevated myocardial infarction) (HCC) 2007   Stroke [facility]) 2004   TIA (transient ischemic attack) 2006    Surgical History: Past Surgical History:  Procedure Laterality Date   AORTA - BILATERAL FEMORAL ARTERY BYPASS GRAFT  01/08/2012   Procedure: AORTA BIFEMORAL BYPASS GRAFT;  Surgeon: Sherren Kerns, MD;  Location: [facility] OR;  Service: Vascular;  Laterality: Bilateral;  using 18x24mm x 40cm Hemashield Gold Vascular Graft with Endarterectomy, Thombectomy and  Reimplantation of Inferior Mesenteric Artery   BACK SURGERY  2021   BRONCHIAL BIOPSY  06/11/2021   Procedure: BRONCHIAL BIOPSIES;  Surgeon: Leslye Peer, MD;  Location: [facility] ENDOSCOPY;  Service: Pulmonary;;  BRONCHIAL BRUSHINGS  06/11/2021   Procedure: BRONCHIAL BRUSHINGS;  Surgeon: Leslye Peer, MD;  Location: [facility] ENDOSCOPY;  Service: Pulmonary;;   BRONCHIAL NEEDLE ASPIRATION BIOPSY  06/11/2021   Procedure: BRONCHIAL NEEDLE ASPIRATION BIOPSIES;  Surgeon: Leslye Peer, MD;  Location: [facility] ENDOSCOPY;  Service: Pulmonary;;   CARDIOVERSION N/A 09/20/2021   Procedure: CARDIOVERSION;  Surgeon: [name], MD;  Location: [facility] ORS;  Service: Cardiovascular;  Laterality: N/A;   COLONOSCOPY N/A 04/14/2019   Procedure: COLONOSCOPY;  Surgeon: Corbin Ade, MD;  Location: [facility] ENDO SUITE;  Service: Endoscopy;  Laterality: N/A;  9:30   COLONOSCOPY WITH PROPOFOL N/A 07/25/2021   Procedure: COLONOSCOPY WITH PROPOFOL;  Surgeon: Lanelle Bal, DO;  Location: [facility] ENDO SUITE;  Service: Endoscopy;  Laterality: N/A;  1:00pm   FIDUCIAL MARKER PLACEMENT   06/11/2021   Procedure: FIDUCIAL MARKER PLACEMENT;  Surgeon: Leslye Peer, MD;  Location: [facility] ENDOSCOPY;  Service: Pulmonary;;   HIP ARTHROPLASTY Right 04/28/2022   Procedure: ARTHROPLASTY BIPOLAR HIP (HEMIARTHROPLASTY);  Surgeon: Sheral Apley, MD;  Location: [facility] OR;  Service: Orthopedics;  Laterality: Right;   INTERCOSTAL NERVE BLOCK Right 11/12/2021   Procedure: INTERCOSTAL NERVE BLOCK;  Surgeon: Loreli Slot, MD;  Location: [facility] OR;  Service: Thoracic;  Laterality: Right;   IR IMAGING GUIDED PORT INSERTION  07/31/2021   Left cataract surgery     LYMPH NODE DISSECTION Right 11/12/2021   Procedure: LYMPH NODE DISSECTION;  Surgeon: Loreli Slot, MD;  Location: [facility] OR;  Service: Thoracic;  Laterality: Right;   POLYPECTOMY  04/14/2019   Procedure: POLYPECTOMY;  Surgeon: Corbin Ade, MD;  Location: [facility] ENDO SUITE;  Service: Endoscopy;;   POLYPECTOMY  07/25/2021   Procedure: POLYPECTOMY;  Surgeon: Lanelle Bal, DO;  Location: [facility] ENDO SUITE;  Service: Endoscopy;;   TEE WITHOUT CARDIOVERSION N/A 09/20/2021   Procedure: TRANSESOPHAGEAL ECHOCARDIOGRAM (TEE);  Surgeon: [name], MD;  Location: [facility] ORS;  Service: Cardiovascular;  Laterality: N/A;   TRANSFORAMINAL LUMBAR INTERBODY FUSION (TLIF) WITH PEDICLE SCREW FIXATION 1 LEVEL N/A 04/27/2020   Procedure: Transforaminal Lumbar Interbody Fusion Lumbar Five-Sacral One;  Surgeon: Bedelia Person, MD;  Location: [facility] OR;  Service: Neurosurgery;  Laterality: N/A;   VIDEO BRONCHOSCOPY WITH INSERTION OF INTERBRONCHIAL VALVE (IBV) N/A 11/22/2021   Procedure: VIDEO BRONCHOSCOPY WITH INSERTION OF INTERBRONCHIAL VALVE (IBV);  Surgeon: Loreli Slot, MD;  Location: [facility] OR;  Service: Thoracic;  Laterality: N/A;   VIDEO BRONCHOSCOPY WITH INSERTION OF INTERBRONCHIAL VALVE (IBV) N/A 01/24/2022   Procedure: VIDEO BRONCHOSCOPY WITH REMOVAL OF INTERBRONCHIAL VALVE (IBV);  Surgeon: Loreli Slot, MD;  Location: [facility] OR;   Service: Thoracic;  Laterality: N/A;   VIDEO BRONCHOSCOPY WITH RADIAL ENDOBRONCHIAL ULTRASOUND  06/11/2021   Procedure: VIDEO BRONCHOSCOPY WITH RADIAL ENDOBRONCHIAL ULTRASOUND;  Surgeon: Leslye Peer, MD;  Location: [facility] ENDOSCOPY;  Service: Pulmonary;;    Social History: Social History   Socioeconomic History   Marital status: Divorced    Spouse name: Not on file   Number of children: 1   Years of education: 11   Highest education level: 11th grade  Occupational History    Employer: Engineer, materials  Tobacco Use   Smoking status: Former    Current packs/day: 0.00    Average packs/day: 1 pack/day for 40.0 years (40.0 ttl pk-yrs)    Types: Cigarettes    Start date: 07/1981    Quit date: 07/2021    Years since quitting: 1.0   Smokeless tobacco: Never  Tobacco comments:    1 pack of cigarettes smoked daily. 07/17/21 ARJ, RN   Vaping Use   Vaping status: Never Used  Substance and Sexual Activity   Alcohol use: No    Comment: Prior history of regular alcohol use   Drug use: No   Sexual activity: Not Currently  Other Topics Concern   Not on file  Social History Narrative   Not on file   Social Determinants of Health   Financial Resource Strain: High Risk (06/11/2022)   Overall Financial Resource Strain (CARDIA)    Difficulty of Paying Living Expenses: Hard  Food Insecurity: Patient Declined (06/11/2022)   Hunger Vital Sign    Worried About Running Out of Food in the Last Year: Patient declined    Ran Out of Food in the Last Year: Patient declined  Transportation Needs: Unmet Transportation Needs (06/11/2022)   PRAPARE - Administrator, Civil Service (Medical): Yes    Lack of Transportation (Non-Medical): Yes  Physical Activity: Unknown (06/11/2022)   Exercise Vital Sign    Days of Exercise per Week: 0 days    Minutes of Exercise per Session: Not on file  Stress: No Stress Concern Present (06/11/2022)   [employer] of Occupational Health - Occupational  Stress Questionnaire    Feeling of Stress : Not at all  Social Connections: Socially Isolated (06/11/2022)   Social Connection and Isolation Panel [NHANES]    Frequency of Communication with Friends and Family: Once a week    Frequency of Social Gatherings with Friends and Family: Never    Attends Religious Services: Never    Database administrator or Organizations: No    Attends Engineer, structural: Not on file    Marital Status: Divorced  Intimate Partner Violence: Not At Risk (05/03/2022)   Humiliation, Afraid, Rape, and Kick questionnaire    Fear of Current or Ex-Partner: No    Emotionally Abused: No    Physically Abused: No    Sexually Abused: No    Family History: Family History  Problem Relation Age of Onset   Hyperlipidemia Sister    Heart attack Brother 68   Cancer - Colon Neg Hx     Current Medications:  Current Outpatient Medications:    acetaminophen (TYLENOL) 500 MG tablet, GIVE 2 TABLETS (1,000 mg TOTAL) BY MOUTH EVERY 6 HOURS AS NEEDED FOR PAIN, Disp: 60 tablet, Rfl: 0   albuterol (VENTOLIN HFA) 108 (90 Base) MCG/ACT inhaler, Inhale 2 puffs into the lungs every 6 (six) hours as needed for wheezing or shortness of breath., Disp: 6.7 g, Rfl: 6   apixaban (ELIQUIS) 5 MG TABS tablet, Take 1 tablet (5 mg total) by mouth 2 (two) times daily., Disp: 180 tablet, Rfl: 3   atorvastatin (LIPITOR) 80 MG tablet, Take 1 tablet (80 mg total) by mouth daily., Disp: 90 tablet, Rfl: 3   cyclobenzaprine (FLEXERIL) 10 MG tablet, Take 1 tablet (10 mg total) by mouth 2 (two) times daily as needed., Disp: 60 tablet, Rfl: 2   escitalopram (LEXAPRO) 10 MG tablet, Take 1 tablet (10 mg total) by mouth daily., Disp: 90 tablet, Rfl: 3   folic acid (KP FOLIC ACID) 1 MG tablet, Take 1 tablet (1 mg total) by mouth daily., Disp: 90 tablet, Rfl: 3   furosemide (LASIX) 20 MG tablet, Take 1 tablet (20 mg total) by mouth daily as needed., Disp: 90 tablet, Rfl: 2   HYDROcodone-acetaminophen  (NORCO/VICODIN) 5-325 MG tablet, Take 1 tablet by  mouth every 6 (six) hours as needed, for pain., Disp: 60 tablet, Rfl: 0   magnesium oxide (MAG-OX) 400 (240 Mg) MG tablet, Take 1 tablet (400 mg total) by mouth daily., Disp: 120 tablet, Rfl: 3   metoprolol succinate (TOPROL-XL) 25 MG 24 hr tablet, Take 3 tablets (75 mg total) by mouth daily. Take with or immediately following a meal. Do Not Crush. (Patient taking differently: Take 50 mg by mouth daily.), Disp: 270 tablet, Rfl: 3   pantoprazole (PROTONIX) 40 MG tablet, Take 1 tablet (40 mg total) by mouth daily., Disp: 90 tablet, Rfl: 3   tamsulosin (FLOMAX) 0.4 MG CAPS capsule, Take 1 capsule (0.4 mg total) by mouth daily., Disp: 90 capsule, Rfl: 3   Tiotropium Bromide Monohydrate (SPIRIVA RESPIMAT) 2.5 MCG/ACT AERS, Inhale 2 puffs into the lungs daily., Disp: 18 g, Rfl: 3 No current facility-administered medications for this visit.  Facility-Administered Medications Ordered in Other Visits:    sodium chloride flush (NS) 0.9 % injection 10 mL, 10 mL, Intracatheter, PRN, [name], MD, 10 mL at 07/10/22 1513   sodium chloride flush (NS) 0.9 % injection 10 mL, 10 mL, Intracatheter, PRN, [name], MD, 10 mL at 08/21/22 1447   Allergies: No Known Allergies  REVIEW OF SYSTEMS:   Review of Systems  Constitutional:  Negative for chills, fatigue and fever.  HENT:   Negative for lump/mass, mouth sores, nosebleeds, sore throat and trouble swallowing.   Eyes:  Negative for eye problems.  Respiratory:  Negative for cough and shortness of breath.   Cardiovascular:  Negative for chest pain, leg swelling and palpitations.  Gastrointestinal:  Negative for abdominal pain, constipation, diarrhea, nausea and vomiting.  Genitourinary:  Negative for bladder incontinence, difficulty urinating, dysuria, frequency, hematuria and nocturia.   Musculoskeletal:  Negative for arthralgias, back pain, flank pain, myalgias and neck pain.  Skin:   Negative for itching and rash.  Neurological:  Negative for dizziness, headaches and numbness.  Hematological:  Does not bruise/bleed easily.  Psychiatric/Behavioral:  Negative for depression, sleep disturbance and suicidal ideas. The patient is not nervous/anxious.   All other systems reviewed and are negative.    VITALS:   There were no vitals taken for this visit.  Wt Readings from Last 3 Encounters:  08/21/22 159 lb 12.8 oz (72.5 kg)  07/31/22 152 lb 9.6 oz (69.2 kg)  07/26/22 150 lb 12.8 oz (68.4 kg)    There is no height or weight on file to calculate BMI.  Performance status (ECOG): 1 - Symptomatic but completely ambulatory  PHYSICAL EXAM:   Physical Exam Vitals and nursing note reviewed. Exam conducted with a chaperone present.  Constitutional:      Appearance: Normal appearance.  Cardiovascular:     Rate and Rhythm: Normal rate and regular rhythm.     Pulses: Normal pulses.     Heart sounds: Normal heart sounds.  Pulmonary:     Effort: Pulmonary effort is normal.     Breath sounds: Normal breath sounds.  Abdominal:     Palpations: Abdomen is soft. There is no hepatomegaly, splenomegaly or mass.     Tenderness: There is no abdominal tenderness.  Musculoskeletal:     Right lower leg: No edema.     Left lower leg: No edema.  Lymphadenopathy:     Cervical: No cervical adenopathy.     Right cervical: No superficial, deep or posterior cervical adenopathy.    Left cervical: No superficial, deep or posterior cervical adenopathy.  Upper Body:     Right upper body: No supraclavicular or axillary adenopathy.     Left upper body: No supraclavicular or axillary adenopathy.  Neurological:     General: No focal deficit present.     Mental Status: He is alert and oriented to person, place, and time.  Psychiatric:        Mood and Affect: Mood normal.        Behavior: Behavior normal.     LABS:      Latest Ref Rng & Units 08/21/2022   11:51 AM 07/31/2022   10:28 AM  07/10/2022   11:48 AM  CBC  WBC 4.0 - 10.5 K/uL 5.6  5.8  6.3   Hemoglobin 13.0 - 17.0 g/dL 57.8  46.9  9.9   Hematocrit 39.0 - 52.0 % 31.7  32.2  30.6   Platelets 150 - 400 K/uL 126  124  120       Latest Ref Rng & Units 08/21/2022   11:51 AM 07/31/2022   10:28 AM 07/10/2022   11:48 AM  CMP  Glucose 70 - 99 mg/dL 629  528  413   BUN 8 - 23 mg/dL 12  13  14    Creatinine 0.61 - 1.24 mg/dL 2.44  0.10  2.72   Sodium 135 - 145 mmol/L 136  136  134   Potassium 3.5 - 5.1 mmol/L 3.8  4.0  3.6   Chloride 98 - 111 mmol/L 104  106  103   CO2 22 - 32 mmol/L 23  23  25    Calcium 8.9 - 10.3 mg/dL 8.9  8.7  8.6   Total Protein 6.5 - 8.1 g/dL 6.1  6.2  6.2   Total Bilirubin 0.3 - 1.2 mg/dL 0.6  0.3  0.8   Alkaline Phos 38 - 126 U/L 62  83  52   AST 15 - 41 U/L 21  23  22    ALT 0 - 44 U/L 16  17  15       No results found for: "CEA1", "CEA" / No results found for: "CEA1", "CEA" Lab Results  Component Value Date   PSA1 1.2 11/25/2019   No results found for: "ZDG644" No results found for: "CAN125"  No results found for: "TOTALPROTELP", "ALBUMINELP", "A1GS", "A2GS", "BETS", "BETA2SER", "GAMS", "MSPIKE", "SPEI" Lab Results  Component Value Date   TIBC 233 (L) 08/21/2022   TIBC 253 06/13/2022   TIBC 291 04/01/2022   FERRITIN 372 (H) 08/21/2022   FERRITIN 501 (H) 06/13/2022   FERRITIN 86 04/01/2022   IRONPCTSAT 37 08/21/2022   IRONPCTSAT 24 06/13/2022   IRONPCTSAT 22 04/01/2022   No results found for: "LDH"   STUDIES:   CT Chest W Contrast  Result Date: 07/31/2022 CLINICAL DATA:  Non-small cell lung cancer (NSCLC), monitor EXAM: CT CHEST WITH CONTRAST TECHNIQUE: Multidetector CT imaging of the chest was performed during intravenous contrast administration. RADIATION DOSE REDUCTION: This exam was performed according to the departmental dose-optimization program which includes automated exposure control, adjustment of the mA and/or kV according to patient size and/or use of iterative  reconstruction technique. CONTRAST:  75mL OMNIPAQUE IOHEXOL 300 MG/ML  SOLN COMPARISON:  CT scan chest from 04/19/2022. FINDINGS: Cardiovascular: Normal cardiac size. No pericardial effusion. No aortic aneurysm. There are coronary artery calcifications, in keeping with coronary artery disease. There are also moderate peripheral atherosclerotic vascular calcifications of thoracic aorta and its major branches. Mediastinum/Nodes: Visualized thyroid gland appears grossly unremarkable. No solid / cystic mediastinal masses.  The esophagus is nondistended precluding optimal assessment. There are few mildly prominent mediastinal lymph nodes, which do not meet the size criteria for lymphadenopathy and appear grossly similar to the prior study, favoring benign etiology. No axillary lymphadenopathy by size criteria. Lungs/Pleura: Redemonstration of postsurgical changes in the right lung. There is stable chronic collection in the right lung apex. The central tracheo-bronchial tree is otherwise patent. No mass or consolidation. No pleural effusion or pneumothorax. Redemonstration of minimal paraseptal emphysematous changes in bilateral lungs. No new or suspicious lung nodules. There is complete clearing of previously noted left lower lobe pulmonary nodule. There is also near complete clearing of previously noted pleural-based nodule in the right lung lower lobe, which may represent resolving subpleural atelectasis. Upper Abdomen: There is a small focus of calcification along the dependent portion of the gallbladder which may represent small volume cholelithiasis versus focal wall calcification. No significant interval change. No imaging evidence of acute cholecystitis. 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 There are stable partially exophytic hypoattenuating  structures arising from bilateral kidneys, favored to represent cysts. Atrophic pancreas. Remaining visualized upper abdominal viscera within normal limits. Musculoskeletal: The visualized soft tissues of the chest wall are grossly unremarkable. No suspicious osseous lesions. Old healed right fourth through sixth rib fractures noted. There are mild multilevel degenerative changes in the visualized spine. IMPRESSION: 1. No evidence of recurrent or metastatic disease within the chest. 2. Complete clearing of previously noted left lower lobe pulmonary nodule. Near complete clearing of previously noted pleural-based nodule in the right lung lower lobe, which may represent resolving subpleural atelectasis. 3. Multiple other nonacute observations, as described above. Aortic Atherosclerosis (ICD10-I70.0) and Emphysema (ICD10-J43.9). Electronically Signed   By: Jules Schick M.D.   On: 07/31/2022 08:42

## 2022-08-21 NOTE — Progress Notes (Signed)
Patient presents today for Keytruda infusion. Patient is in satisfactory condition with no new complaints voiced.  Vital signs are stable.  Labs reviewed by Dr. Ellin Saba during the office visit and all labs are within treatment parameters.  We will proceed with treatment per MD orders.   Patient tolerated treatment well with no complaints voiced.  Patient left ambulatory with walker with wife in stable condition.  Vital signs stable at discharge.  Follow up as scheduled.

## 2022-08-21 NOTE — Progress Notes (Signed)
Patients port flushed without difficulty.  Good blood return noted with no bruising or swelling noted at site.  Stable during access and blood draw.  Patient to remain accessed for treatment. 

## 2022-08-21 NOTE — Patient Instructions (Signed)

## 2022-08-21 NOTE — Progress Notes (Signed)
Patient has been examined by Dr. Katragadda. Vital signs and labs have been reviewed by MD - ANC, Creatinine, LFTs, hemoglobin, and platelets are within treatment parameters per M.D. - pt may proceed with treatment.  Primary RN and pharmacy notified.  

## 2022-08-21 NOTE — Patient Instructions (Signed)
[facility]  Discharge Instructions: Thank you for choosing [facility] to provide your oncology and hematology care.  If you have a lab appointment with the Cancer Center - please note that after April 8th, 2024, all labs will be drawn in the cancer center.  You do not have to check in or register with the main entrance as you have in the past but will complete your check-in in the cancer center.  Wear comfortable clothing and clothing appropriate for easy access to any Portacath or PICC line.   We strive to give you quality time with your provider. You may need to reschedule your appointment if you arrive late (15 or more minutes).  Arriving late affects you and other patients whose appointments are after yours.  Also, if you miss three or more appointments without notifying the office, you may be dismissed from the clinic at the provider's discretion.      For prescription refill requests, have your pharmacy contact our office and allow 72 hours for refills to be completed.    Today you received the following chemotherapy and/or immunotherapy agents Keytruda. Pembrolizumab Injection What is this medication? PEMBROLIZUMAB (PEM broe LIZ ue mab) treats some types of cancer. It works by helping your immune system slow or stop the spread of cancer cells. It is a monoclonal antibody. This medicine may be used for other purposes; ask your health care provider or pharmacist if you have questions. COMMON BRAND NAME(S): Keytruda What should I tell my care team before I take this medication? They need to know if you have any of these conditions: Allogeneic stem cell transplant (uses someone else's stem cells) Autoimmune diseases, such as Crohn disease, ulcerative colitis, lupus History of chest radiation Nervous system problems, such as Guillain-Barre syndrome, myasthenia gravis Organ transplant An unusual or allergic reaction to pembrolizumab, other medications,  foods, dyes, or preservatives Pregnant or trying to get pregnant Breast-feeding How should I use this medication? This medication is injected into a vein. It is given by your care team in a hospital or clinic setting. A special MedGuide will be given to you before each treatment. Be sure to read this information carefully each time. Talk to your care team about the use of this medication in children. While it may be prescribed for children as young as 6 months for selected conditions, precautions do apply. Overdosage: If you think you have taken too much of this medicine contact a poison control center or emergency room at once. NOTE: This medicine is only for you. Do not share this medicine with others. What if I miss a dose? Keep appointments for follow-up doses. It is important not to miss your dose. Call your care team if you are unable to keep an appointment. What may interact with this medication? Interactions have not been studied. This list may not describe all possible interactions. Give your health care provider a list of all the medicines, herbs, non-prescription drugs, or dietary supplements you use. Also tell them if you smoke, drink alcohol, or use illegal drugs. Some items may interact with your medicine. What should I watch for while using this medication? Your condition will be monitored carefully while you are receiving this medication. You may need blood work while taking this medication. This medication may cause serious skin reactions. They can happen weeks to months after starting the medication. Contact your care team right away if you notice fevers or flu-like symptoms with a rash. The rash   may be red or purple and then turn into blisters or peeling of the skin. You may also notice a red rash with swelling of the face, lips, or lymph nodes in your neck or under your arms. Tell your care team right away if you have any change in your eyesight. Talk to your care team if you  may be pregnant. Serious birth defects can occur if you take this medication during pregnancy and for 4 months after the last dose. You will need a negative pregnancy test before starting this medication. Contraception is recommended while taking this medication and for 4 months after the last dose. Your care team can help you find the option that works for you. Do not breastfeed while taking this medication and for 4 months after the last dose. What side effects may I notice from receiving this medication? Side effects that you should report to your care team as soon as possible: Allergic reactions--skin rash, itching, hives, swelling of the face, lips, tongue, or throat Dry cough, shortness of breath or trouble breathing Eye pain, redness, irritation, or discharge with blurry or decreased vision Heart muscle inflammation--unusual weakness or fatigue, shortness of breath, chest pain, fast or irregular heartbeat, dizziness, swelling of the ankles, feet, or hands Hormone gland problems--headache, sensitivity to light, unusual weakness or fatigue, dizziness, fast or irregular heartbeat, increased sensitivity to cold or heat, excessive sweating, constipation, hair loss, increased thirst or amount of urine, tremors or shaking, irritability Infusion reactions--chest pain, shortness of breath or trouble breathing, feeling faint or lightheaded Kidney injury (glomerulonephritis)--decrease in the amount of urine, red or dark brown urine, foamy or bubbly urine, swelling of the ankles, hands, or feet Liver injury--right upper belly pain, loss of appetite, nausea, light-colored stool, dark yellow or brown urine, yellowing skin or eyes, unusual weakness or fatigue Pain, tingling, or numbness in the hands or feet, muscle weakness, change in vision, confusion or trouble speaking, loss of balance or coordination, trouble walking, seizures Rash, fever, and swollen lymph nodes Redness, blistering, peeling, or loosening  of the skin, including inside the mouth Sudden or severe stomach pain, bloody diarrhea, fever, nausea, vomiting Side effects that usually do not require medical attention (report to your care team if they continue or are bothersome): Bone, joint, or muscle pain Diarrhea Fatigue Loss of appetite Nausea Skin rash This list may not describe all possible side effects. Call your doctor for medical advice about side effects. You may report side effects to FDA at 1-800-FDA-1088. Where should I keep my medication? This medication is given in a hospital or clinic. It will not be stored at home. NOTE: This sheet is a summary. It may not cover all possible information. If you have questions about this medicine, talk to your doctor, pharmacist, or health care provider.  2024 Elsevier/Gold Standard (2021-05-22 00:00:00)       To help prevent nausea and vomiting after your treatment, we encourage you to take your nausea medication as directed.  BELOW ARE SYMPTOMS THAT SHOULD BE REPORTED IMMEDIATELY: *FEVER GREATER THAN 100.4 F (38 C) OR HIGHER *CHILLS OR SWEATING *NAUSEA AND VOMITING THAT IS NOT CONTROLLED WITH YOUR NAUSEA MEDICATION *UNUSUAL SHORTNESS OF BREATH *UNUSUAL BRUISING OR BLEEDING *URINARY PROBLEMS (pain or burning when urinating, or frequent urination) *BOWEL PROBLEMS (unusual diarrhea, constipation, pain near the anus) TENDERNESS IN MOUTH AND THROAT WITH OR WITHOUT PRESENCE OF ULCERS (sore throat, sores in mouth, or a toothache) UNUSUAL RASH, SWELLING OR PAIN  UNUSUAL VAGINAL DISCHARGE OR ITCHING     Items with * indicate a potential emergency and should be followed up as soon as possible or go to the Emergency Department if any problems should occur.  Please show the CHEMOTHERAPY ALERT CARD or IMMUNOTHERAPY ALERT CARD at check-in to the Emergency Department and triage nurse.  Should you have questions after your visit or need to cancel or reschedule your appointment, please contact  [facility] [phone]  and follow the prompts.  Office hours are 8:00 a.m. to 4:30 p.m. Monday - Friday. Please note that voicemails left after 4:00 p.m. may not be returned until the following business day.  We are closed weekends and major holidays. You have access to a nurse at all times for urgent questions. Please call the main number to the clinic [phone] and follow the prompts.  For any non-urgent questions, you may also contact your provider using MyChart. We now offer e-Visits for anyone 18 and older to request care online for non-urgent symptoms. For details visit [email or web address].   Also download the MyChart app! Go to the app store, search "MyChart", open the app, select [facility], and log in with your MyChart username and password.   

## 2022-08-30 ENCOUNTER — Ambulatory Visit (INDEPENDENT_AMBULATORY_CARE_PROVIDER_SITE_OTHER): Payer: HMO

## 2022-08-30 VITALS — BP 119/68 | Ht 70.0 in | Wt 162.0 lb

## 2022-08-30 DIAGNOSIS — Z0000 Encounter for general adult medical examination without abnormal findings: Secondary | ICD-10-CM

## 2022-08-30 DIAGNOSIS — Z5941 Food insecurity: Secondary | ICD-10-CM | POA: Diagnosis not present

## 2022-08-30 NOTE — Patient Instructions (Addendum)
Darryl Ward , Thank you for taking time to come for your Medicare Wellness Visit. I appreciate your ongoing commitment to your health goals. Please review the following plan we discussed and let me know if I can assist you in the future.   These are the goals we discussed:  Goals      DIET - INCREASE WATER INTAKE     Try to drink 6-8 glasses of water daily     Increase physical activity     Patient Stated     Get healthier         This is a list of the screening recommended for you and due dates:  Health Maintenance  Topic Date Due   DTaP/Tdap/Td vaccine (1 - Tdap) Never done   COVID-19 Vaccine (7 - 2023-24 season) 04/08/2022   Flu Shot  08/22/2022   Medicare Annual Wellness Visit  08/30/2023   Colon Cancer Screening  07/26/2031   Pneumonia Vaccine  Completed   Zoster (Shingles) Vaccine  Completed   HPV Vaccine  Aged Out   Hepatitis C Screening  Discontinued    Advanced directives: Information on Advanced Care Planning can be found at [facility] Advance Health Care Directives Advance Health Care Directives ([email or web address]    Conditions/risks identified:  Understanding Your Risk for Falls Millions of people have serious injuries from falls each year. It is important to understand your risk of falling. Talk with your health care provider about your risk and what you can do to lower it. If you do have a serious fall, make sure to tell your provider. Falling once raises your risk of falling again. How can falls affect me? Serious injuries from falls are common. These include: Broken bones, such as hip fractures. Head injuries, such as traumatic brain injuries (TBI) or concussions. A fear of falling can cause you to avoid activities and stay at home. This can make your muscles weaker and raise your risk for a fall. What can increase my risk? There are a number of risk factors that increase your risk for falling. The more risk factors you have, the higher your  risk of falling. Serious injuries from a fall happen most often to people who are older than 75 years old. Teenagers and young adults ages 72-29 are also at higher risk. Common risk factors include: Weakness in the lower body. Being generally weak or confused due to long-term (chronic) illness. Dizziness or balance problems. Poor vision. Medicines that cause dizziness or drowsiness. These may include: Medicines for your blood pressure, heart, anxiety, insomnia, or swelling (edema). Pain medicines. Muscle relaxants. Other risk factors include: Drinking alcohol. Having had a fall in the past. Having foot pain or wearing improper footwear. Working at a dangerous job. Having any of the following in your home: Tripping hazards, such as floor clutter or loose rugs. Poor lighting. Pets. Having dementia or memory loss. What actions can I take to lower my risk of falling?     Physical activity Stay physically fit. Do strength and balance exercises. Consider taking a regular class to build strength and balance. Yoga and tai chi are good options. Vision Have your eyes checked every year and your prescription for glasses or contacts updated as needed. Shoes and walking aids Wear non-skid shoes. Wear shoes that have rubber soles and low heels. Do not wear high heels. Do not walk around the house in socks or slippers. Use a cane or walker as told by your provider. Home safety  Attach secure railings on both sides of your stairs. Install grab bars for your bathtub, shower, and toilet. Use a non-skid mat in your bathtub or shower. Attach bath mats securely with double-sided, non-slip rug tape. Use good lighting in all rooms. Keep a flashlight near your bed. Make sure there is a clear path from your bed to the bathroom. Use night-lights. Do not use throw rugs. Make sure all carpeting is taped or tacked down securely. Remove all clutter from walkways and stairways, including extension  cords. Repair uneven or broken steps and floors. Avoid walking on icy or slippery surfaces. Walk on the grass instead of on icy or slick sidewalks. Use ice melter to get rid of ice on walkways in the winter. Use a cordless phone. Questions to ask your health care provider Can you help me check my risk for a fall? Do any of my medicines make me more likely to fall? Should I take a vitamin D supplement? What exercises can I do to improve my strength and balance? Should I make an appointment to have my vision checked? Do I need a bone density test to check for weak bones (osteoporosis)? Would it help to use a cane or a walker? Where to find more information Centers for Disease Control and Prevention, STEADI: TonerPromos.no Community-Based Fall Prevention Programs: TonerPromos.no [employer] on Aging: BaseRingTones.pl Contact a health care provider if: You fall at home. You are afraid of falling at home. You feel weak, drowsy, or dizzy. This information is not intended to replace advice given to you by your health care provider. Make sure you discuss any questions you have with your health care provider. Document Revised: 09/10/2021 Document Reviewed: 09/10/2021 Elsevier Patient Education  2024 Elsevier Inc.  A referral has been placed for you so we can see what additional resources may be available to you for food.    Next appointment: VIRTUAL/ TELEPHONE VISIT Follow up in one year for your annual wellness visit  September 05, 2023 at 11:30 am telephone visit.    Preventive Care 56 Years and Older, Male  Preventive care refers to lifestyle choices and visits with your health care provider that can promote health and wellness. What does preventive care include? A yearly physical exam. This is also called an annual well check. Dental exams once or twice a year. Routine eye exams. Ask your health care provider how often you should have your eyes checked. Personal lifestyle choices,  including: Daily care of your teeth and gums. Regular physical activity. Eating a healthy diet. Avoiding tobacco and drug use. Limiting alcohol use. Practicing safe sex. Taking low doses of aspirin every day. Taking vitamin and mineral supplements as recommended by your health care provider. What happens during an annual well check? The services and screenings done by your health care provider during your annual well check will depend on your age, overall health, lifestyle risk factors, and family history of disease. Counseling  Your health care provider may ask you questions about your: Alcohol use. Tobacco use. Drug use. Emotional well-being. Home and relationship well-being. Sexual activity. Eating habits. History of falls. Memory and ability to understand (cognition). Work and work Astronomer. Screening  You may have the following tests or measurements: Height, weight, and BMI. Blood pressure. Lipid and cholesterol levels. These may be checked every 5 years, or more frequently if you are over 85 years old. Skin check. Lung cancer screening. You may have this screening every year starting at age 74 if you have  a 30-pack-year history of smoking and currently smoke or have quit within the past 15 years. Fecal occult blood test (FOBT) of the stool. You may have this test every year starting at age 59. Flexible sigmoidoscopy or colonoscopy. You may have a sigmoidoscopy every 5 years or a colonoscopy every 10 years starting at age 5. Prostate cancer screening. Recommendations will vary depending on your family history and other risks. Hepatitis C blood test. Hepatitis B blood test. Sexually transmitted disease (STD) testing. Diabetes screening. This is done by checking your blood sugar (glucose) after you have not eaten for a while (fasting). You may have this done every 1-3 years. Abdominal aortic aneurysm (AAA) screening. You may need this if you are a current or former  smoker. Osteoporosis. You may be screened starting at age 21 if you are at high risk. Talk with your health care provider about your test results, treatment options, and if necessary, the need for more tests. Vaccines  Your health care provider may recommend certain vaccines, such as: Influenza vaccine. This is recommended every year. Tetanus, diphtheria, and acellular pertussis (Tdap, Td) vaccine. You may need a Td booster every 10 years. Zoster vaccine. You may need this after age 15. Pneumococcal 13-valent conjugate (PCV13) vaccine. One dose is recommended after age 54. Pneumococcal polysaccharide (PPSV23) vaccine. One dose is recommended after age 37. Talk to your health care provider about which screenings and vaccines you need and how often you need them. This information is not intended to replace advice given to you by your health care provider. Make sure you discuss any questions you have with your health care provider. Document Released: 02/03/2015 Document Revised: 09/27/2015 Document Reviewed: 11/08/2014 Elsevier Interactive Patient Education  2017 [employer].  Fall Prevention in the Home Falls can cause injuries. They can happen to people of all ages. There are many things you can do to make your home safe and to help prevent falls. What can I do on the outside of my home? Regularly fix the edges of walkways and driveways and fix any cracks. Remove anything that might make you trip as you walk through a door, such as a raised step or threshold. Trim any bushes or trees on the path to your home. Use bright outdoor lighting. Clear any walking paths of anything that might make someone trip, such as rocks or tools. Regularly check to see if handrails are loose or broken. Make sure that both sides of any steps have handrails. Any raised decks and porches should have guardrails on the edges. Have any leaves, snow, or ice cleared regularly. Use sand or salt on walking paths during  winter. Clean up any spills in your garage right away. This includes oil or grease spills. What can I do in the bathroom? Use night lights. Install grab bars by the toilet and in the tub and shower. Do not use towel bars as grab bars. Use non-skid mats or decals in the tub or shower. If you need to sit down in the shower, use a plastic, non-slip stool. Keep the floor dry. Clean up any water that spills on the floor as soon as it happens. Remove soap buildup in the tub or shower regularly. Attach bath mats securely with double-sided non-slip rug tape. Do not have throw rugs and other things on the floor that can make you trip. What can I do in the bedroom? Use night lights. Make sure that you have a light by your bed that is easy to  reach. Do not use any sheets or blankets that are too big for your bed. They should not hang down onto the floor. Have a firm chair that has side arms. You can use this for support while you get dressed. Do not have throw rugs and other things on the floor that can make you trip. What can I do in the kitchen? Clean up any spills right away. Avoid walking on wet floors. Keep items that you use a lot in easy-to-reach places. If you need to reach something above you, use a strong step stool that has a grab bar. Keep electrical cords out of the way. Do not use floor polish or wax that makes floors slippery. If you must use wax, use non-skid floor wax. Do not have throw rugs and other things on the floor that can make you trip. What can I do with my stairs? Do not leave any items on the stairs. Make sure that there are handrails on both sides of the stairs and use them. Fix handrails that are broken or loose. Make sure that handrails are as long as the stairways. Check any carpeting to make sure that it is firmly attached to the stairs. Fix any carpet that is loose or worn. Avoid having throw rugs at the top or bottom of the stairs. If you do have throw rugs,  attach them to the floor with carpet tape. Make sure that you have a light switch at the top of the stairs and the bottom of the stairs. If you do not have them, ask someone to add them for you. What else can I do to help prevent falls? Wear shoes that: Do not have high heels. Have rubber bottoms. Are comfortable and fit you well. Are closed at the toe. Do not wear sandals. If you use a stepladder: Make sure that it is fully opened. Do not climb a closed stepladder. Make sure that both sides of the stepladder are locked into place. Ask someone to hold it for you, if possible. Clearly mark and make sure that you can see: Any grab bars or handrails. First and last steps. Where the edge of each step is. Use tools that help you move around (mobility aids) if they are needed. These include: Canes. Walkers. Scooters. Crutches. Turn on the lights when you go into a dark area. Replace any light bulbs as soon as they burn out. Set up your furniture so you have a clear path. Avoid moving your furniture around. If any of your floors are uneven, fix them. If there are any pets around you, be aware of where they are. Review your medicines with your doctor. Some medicines can make you feel dizzy. This can increase your chance of falling. Ask your doctor what other things that you can do to help prevent falls. This information is not intended to replace advice given to you by your health care provider. Make sure you discuss any questions you have with your health care provider. Document Released: 11/03/2008 Document Revised: 06/15/2015 Document Reviewed: 02/11/2014 Elsevier Interactive Patient Education  2017 [employer].

## 2022-08-30 NOTE — Progress Notes (Signed)
 Because this visit was a virtual/telehealth visit,  certain criteria was not obtained, such a blood pressure, CBG if patient is a diabetic, and timed up and go. Any medications not marked as "taking" was not mentioned during the medication reconciliation part of the visit. Any vitals not documented were not able to be obtained due to this being a telehealth visit. Vitals documented are verbally provided by the patient.   Subjective:   Darryl Ward is a 77 y.o. male who presents for Medicare Annual/Subsequent preventive examination.  Visit Complete: Virtual  I connected with  Darryl Ward on 08/30/22 by a audio enabled telemedicine application and verified that I am speaking with the correct person using two identifiers.  Patient Location: Home  Provider Location: Home Office  I discussed the limitations of evaluation and management by telemedicine. The patient expressed understanding and agreed to proceed.  Patient Medicare AWV questionnaire was completed by the patient on na; I have confirmed that all information answered by patient is correct and no changes since this date.  Review of Systems     Cardiac Risk Factors include: advanced age (>51men, >74 women);dyslipidemia;hypertension;male gender     Objective:    Today's Vitals   08/30/22 1426 08/30/22 1427  BP: 119/68   Weight: 162 lb (73.5 kg)   Height: 5\' 10"  (1.778 m)   PainSc:  0-No pain   Body mass index is 23.24 kg/m.     08/30/2022    2:29 PM 08/21/2022   12:43 PM 07/31/2022   11:04 AM 07/10/2022   12:46 PM 05/03/2022    3:00 PM 05/01/2022    7:56 PM 04/27/2022    9:27 PM  Advanced Directives  Does Patient Have a Medical Advance Directive? Yes Yes Yes Yes  No No  Type of Estate agent of [place];Living will Healthcare Power of [place];Living will Living will;Healthcare Power of [employer] Power of [place];Living will  Living will;Healthcare Power of Attorney   Does patient want to  make changes to medical advance directive? No - Patient declined No - Patient declined No - Patient declined No - Patient declined     Copy of Healthcare Power of Attorney in Chart? Yes - validated most recent copy scanned in chart (See row information) No - copy requested No - copy requested No - copy requested     Would patient like information on creating a medical advance directive? No - Patient declined No - Patient declined No - Patient declined No - Patient declined No - Patient declined  Yes (ED - Information included in AVS)    Current Medications (verified) Outpatient Encounter Medications as of 08/30/2022  Medication Sig   acetaminophen (TYLENOL) 500 MG tablet GIVE 2 TABLETS (1,000 mg TOTAL) BY MOUTH EVERY 6 HOURS AS NEEDED FOR PAIN   albuterol (VENTOLIN HFA) 108 (90 Base) MCG/ACT inhaler Inhale 2 puffs into the lungs every 6 (six) hours as needed for wheezing or shortness of breath.   apixaban (ELIQUIS) 5 MG TABS tablet Take 1 tablet (5 mg total) by mouth 2 (two) times daily.   atorvastatin (LIPITOR) 80 MG tablet Take 1 tablet (80 mg total) by mouth daily.   cyclobenzaprine (FLEXERIL) 10 MG tablet Take 1 tablet (10 mg total) by mouth 2 (two) times daily as needed.   escitalopram (LEXAPRO) 10 MG tablet Take 1 tablet (10 mg total) by mouth daily.   folic acid (KP FOLIC ACID) 1 MG tablet Take 1 tablet (1 mg total) by mouth  daily.   furosemide (LASIX) 20 MG tablet Take 1 tablet (20 mg total) by mouth daily as needed.   HYDROcodone-acetaminophen (NORCO/VICODIN) 5-325 MG tablet Take 1 tablet by mouth every 6 (six) hours as needed, for pain.   magnesium oxide (MAG-OX) 400 (240 Mg) MG tablet Take 1 tablet (400 mg total) by mouth daily.   metoprolol succinate (TOPROL-XL) 25 MG 24 hr tablet Take 3 tablets (75 mg total) by mouth daily. Take with or immediately following a meal. Do Not Crush. (Patient taking differently: Take 50 mg by mouth daily.)   pantoprazole (PROTONIX) 40 MG tablet Take 1  tablet (40 mg total) by mouth daily.   tamsulosin (FLOMAX) 0.4 MG CAPS capsule Take 1 capsule (0.4 mg total) by mouth daily.   Tiotropium Bromide Monohydrate (SPIRIVA RESPIMAT) 2.5 MCG/ACT AERS Inhale 2 puffs into the lungs daily.   Facility-Administered Encounter Medications as of 08/30/2022  Medication   sodium chloride flush (NS) 0.9 % injection 10 mL    Allergies (verified) Patient has no known allergies.   History: Past Medical History:  Diagnosis Date   AAA (abdominal aortic aneurysm) (HCC)    Arthritis    Cancer (HCC)    Carotid artery disease (HCC)    Nonobstructive   Cataract    COPD (chronic obstructive pulmonary disease) (HCC)    Coronary atherosclerosis of native coronary artery    PTCA small diagonal 2007 otherwise nonobstructive CAD   Depression    Dysrhythmia    Essential hypertension, benign    Hyperlipidemia    NSTEMI (non-ST elevated myocardial infarction) (HCC) 2007   Stroke [facility]) 2004   TIA (transient ischemic attack) 2006   Past Surgical History:  Procedure Laterality Date   AORTA - BILATERAL FEMORAL ARTERY BYPASS GRAFT  01/08/2012   Procedure: AORTA BIFEMORAL BYPASS GRAFT;  Surgeon: Sherren Kerns, MD;  Location: [facility] OR;  Service: Vascular;  Laterality: Bilateral;  using 18x16mm x 40cm Hemashield Gold Vascular Graft with Endarterectomy, Thombectomy and  Reimplantation of Inferior Mesenteric Artery   BACK SURGERY  2021   BRONCHIAL BIOPSY  06/11/2021   Procedure: BRONCHIAL BIOPSIES;  Surgeon: Leslye Peer, MD;  Location: [facility] ENDOSCOPY;  Service: Pulmonary;;   BRONCHIAL BRUSHINGS  06/11/2021   Procedure: BRONCHIAL BRUSHINGS;  Surgeon: Leslye Peer, MD;  Location: [facility] ENDOSCOPY;  Service: Pulmonary;;   BRONCHIAL NEEDLE ASPIRATION BIOPSY  06/11/2021   Procedure: BRONCHIAL NEEDLE ASPIRATION BIOPSIES;  Surgeon: Leslye Peer, MD;  Location: [facility] ENDOSCOPY;  Service: Pulmonary;;   CARDIOVERSION N/A 09/20/2021   Procedure: CARDIOVERSION;  Surgeon: [name], MD;  Location: [facility] ORS;  Service: Cardiovascular;  Laterality: N/A;   COLONOSCOPY N/A 04/14/2019   Procedure: COLONOSCOPY;  Surgeon: Corbin Ade, MD;  Location: [facility] ENDO SUITE;  Service: Endoscopy;  Laterality: N/A;  9:30   COLONOSCOPY WITH PROPOFOL N/A 07/25/2021   Procedure: COLONOSCOPY WITH PROPOFOL;  Surgeon: Lanelle Bal, DO;  Location: [facility] ENDO SUITE;  Service: Endoscopy;  Laterality: N/A;  1:00pm   FIDUCIAL MARKER PLACEMENT  06/11/2021   Procedure: FIDUCIAL MARKER PLACEMENT;  Surgeon: Leslye Peer, MD;  Location: [facility] ENDOSCOPY;  Service: Pulmonary;;   HIP ARTHROPLASTY Right 04/28/2022   Procedure: ARTHROPLASTY BIPOLAR HIP (HEMIARTHROPLASTY);  Surgeon: Sheral Apley, MD;  Location[facility] OR;  Service: Orthopedics;  Laterality: Right;   INTERCOSTAL NERVE BLOCK Right 11/12/2021   Procedure: INTERCOSTAL NERVE BLOCK;  Surgeon: Loreli Slot, MD;  Location: [facility] OR;  Service: Thoracic;  Laterality: Right;  IR IMAGING GUIDED PORT INSERTION  07/31/2021   Left cataract surgery     LYMPH NODE DISSECTION Right 11/12/2021   Procedure: LYMPH NODE DISSECTION;  Surgeon: Loreli Slot, MD;  Location: [facility] OR;  Service: Thoracic;  Laterality: Right;   POLYPECTOMY  04/14/2019   Procedure: POLYPECTOMY;  Surgeon: Corbin Ade, MD;  [facility] END[facility];  Service: Endoscopy;;   POLYPECTOMY  07/25/2021   Procedure: POLYPECTOMY;  Surgeon: Lanelle Bal, DO;  [facility] ENDO SU[facility];  Service: Endoscopy;;   TEE WITHOUT CARDIOVERSION N/A 09/20/2021   Procedure: TRANSESOPHAGEAL ECHOCARDIOGRAM (TEE);  Surgeon: [name], MD;  Location: [facility] ORS;  Service: Cardiovascular;  Laterality: N/A;   TRANSFORAMINAL LUMBAR INTERBODY FUSION (TLIF) WITH PEDICLE SCREW FIXATION 1 LEVEL N/A 04/27/2020   Procedure: Transforaminal Lumbar Interbody Fusion Lumbar Five-Sacral One;  Surgeon: Bedelia Person, MD;  Location: [facility] OR;  Service: Neurosurgery;  Laterality: N/A;   VIDEO  BRONCHOSCOPY WITH INSERTION OF INTERBRONCHIAL VALVE (IBV) N/A 11/22/2021   Procedure: VIDEO BRONCHOSCOPY WITH INSERTION OF INTERBRONCHIAL VALVE (IBV);  Surgeon: Loreli Slot, MD;  Location: [facility] OR;  Service: Thoracic;  Laterality: N/A;   VIDEO BRONCHOSCOPY WITH INSERTION OF INTERBRONCHIAL VALVE (IBV) N/A 01/24/2022   Procedure: VIDEO BRONCHOSCOPY WITH REMOVAL OF INTERBRONCHIAL VALVE (IBV);  Surgeon: Loreli Slot, MD;  Location: [facility] OR;  Service: Thoracic;  Laterality: N/A;   VIDEO BRONCHOSCOPY WITH RADIAL ENDOBRONCHIAL ULTRASOUND  06/11/2021   Procedure: VIDEO BRONCHOSCOPY WITH RADIAL ENDOBRONCHIAL ULTRASOUND;  Surgeon: Leslye Peer, MD;  Location: [facility] ENDOSCOPY;  Service: Pulmonary;;   Family History  Problem Relation Age of Onset   Hyperlipidemia Sister    Heart attack Brother 35   Cancer - Colon Neg Hx    Social History   Socioeconomic History   Marital status: Divorced    Spouse name: Not on file   Number of children: 1   Years of education: 11   Highest education level: 11th grade  Occupational History    Employer: Engineer, materials  Tobacco Use   Smoking status: Former    Current packs/day: 0.00    Average packs/day: 1 pack/day for 40.0 years (40.0 ttl pk-yrs)    Types: Cigarettes    Start date: 07/1981    Quit date: 07/2021    Years since quitting: 1.1   Smokeless tobacco: Never   Tobacco comments:    1 pack of cigarettes smoked daily. 07/17/21 ARJ, RN   Vaping Use   Vaping status: Never Used  Substance and Sexual Activity   Alcohol use: No    Comment: Prior history of regular alcohol use   Drug use: No   Sexual activity: Not Currently  Other Topics Concern   Not on file  Social History Narrative   Not on file   Social Determinants of Health   Financial Resource Strain: High Risk (08/30/2022)   Overall Financial Resource Strain (CARDIA)    Difficulty of Paying Living Expenses: Hard  Food Insecurity: Food Insecurity Present (08/30/2022)   Hunger  Vital Sign    Worried About Running Out of Food in the Last Year: Sometimes true    Ran Out of Food in the Last Year: Sometimes true  Transportation Needs: No Transportation Needs (08/30/2022)   PRAPARE - Administrator, Civil Service (Medical): No    Lack of Transportation (Non-Medical): No  Recent Concern: Transportation Needs - Unmet Transportation Needs (06/11/2022)   PRAPARE - Administrator, Civil Service (Medical):  Yes    Lack of Transportation (Non-Medical): Yes  Physical Activity: Insufficiently Active (08/30/2022)   Exercise Vital Sign    Days of Exercise per Week: 7 days    Minutes of Exercise per Session: 20 min  Stress: No Stress Concern Present (08/30/2022)   [employer] of Occupational Health - Occupational Stress Questionnaire    Feeling of Stress : Not at all  Social Connections: Socially Isolated (08/30/2022)   Social Connection and Isolation Panel [NHANES]    Frequency of Communication with Friends and Family: Twice a week    Frequency of Social Gatherings with Friends and Family: Twice a week    Attends Religious Services: Never    Database administrator or Organizations: No    Attends Banker Meetings: Never    Marital Status: Divorced    Tobacco Counseling Counseling given: Yes Tobacco comments: 1 pack of cigarettes smoked daily. 07/17/21 ARJ, RN    Clinical Intake:  Pre-visit preparation completed: Yes  Pain : No/denies pain Pain Score: 0-No pain     BMI - recorded: 23.24 Nutritional Status: BMI of 19-24  Normal Nutritional Risks: None Diabetes: No  How often do you need to have someone help you when you read instructions, pamphlets, or other written materials from your doctor or pharmacy?: 1 - Never  Interpreter Needed?: No  Information entered by ::  , CMA   Activities of Daily Living    08/30/2022    2:28 PM 05/03/2022    3:00 PM  In your present state of health, do you have any difficulty  performing the following activities:  Hearing? 1 1  Comment wears hearing aids   Vision? 0 0  Difficulty concentrating or making decisions? 0 1  Walking or climbing stairs? 1 1  Comment depends on the incline of the stairs   Dressing or bathing? 0 0  Doing errands, shopping? 0 0  Preparing Food and eating ? N   Using the Toilet? N   In the past six months, have you accidently leaked urine? N   Do you have problems with loss of bowel control? N   Managing your Medications? N   Managing your Finances? N   Housekeeping or managing your Housekeeping? N     Patient Care Team: [name], DO as PCP - General (Family Medicine) [name], MD as PCP - Cardiology (Cardiology) [name], MD (Cardiology) [name], MD as Consulting Physician (Gastroenterology) [name], RN as Oncology Nurse Navigator (Medical Oncology) [name], MD as Medical Oncologist (Medical Oncology) [name], MD as Consulting Physician (Hematology)  Indicate any recent Medical Services you may have received from other than [facility] providers in the past year (date may be approximate).     Assessment:   This is a routine wellness examination for Darryl Ward.  Hearing/Vision screen Hearing Screening - Comments:: Patient wears hearing aids   Dietary issues and exercise activities discussed:     Goals Addressed             This Visit's Progress    Patient Stated       Get healthier        Depression Screen    08/30/2022    2:32 PM 06/12/2022    3:39 PM 03/27/2022    3:27 PM 07/13/2020    9:39 AM 04/21/2020   12:12 PM 02/15/2020   11:13 AM 02/15/2020   11:07 AM  PHQ 2/9 Scores  PHQ - 2 Score  0 4 2 0 0 0 0  PHQ- 9 Score  12 9 3        Fall Risk    08/30/2022    2:30 PM 03/27/2022    3:24 PM 07/13/2020    9:39 AM 04/21/2020   12:12 PM 02/15/2020   11:13 AM  Fall Risk   Falls in the past year? 0 1 1 0 1  Number falls in past yr: 0 1 1  1   Injury with Fall?  0 0 0  0  Risk for fall due to : History of fall(s);Impaired balance/gait;Impaired mobility History of fall(s);Impaired mobility;Impaired balance/gait   History of fall(s);Impaired mobility  Follow up Falls prevention discussed;Education provided Education provided Falls prevention discussed  Falls evaluation completed    MEDICARE RISK AT HOME:  Medicare Risk at Home - 08/30/22 1430     Any stairs in or around the home? No    If so, are there any without handrails? No    Home free of loose throw rugs in walkways, pet beds, electrical cords, etc? Yes    Adequate lighting in your home to reduce risk of falls? Yes    Life alert? No    Use of a cane, walker or w/c? Yes    Grab bars in the bathroom? Yes    Shower chair or bench in shower? Yes    Elevated toilet seat or a handicapped toilet? Yes             TIMED UP AND GO:  Was the test performed?  No    Cognitive Function:        08/30/2022    2:30 PM 11/29/2019    2:45 PM 11/20/2018    9:49 AM  6CIT Screen  What Year? 0 points 0 points 0 points  What month? 0 points 0 points 0 points  What time? 0 points 0 points 0 points  Count back from 20 0 points 0 points 0 points  Months in reverse 0 points 0 points 0 points  Repeat phrase 2 points 2 points 2 points  Total Score 2 points 2 points 2 points    Immunizations Immunization History  Administered Date(s) Administered   Fluad Quad(high Dose 65+) 11/25/2019   Influenza, High Dose Seasonal PF 11/08/2016, 11/13/2017, 10/09/2018, 10/25/2020   Influenza-Unspecified 10/22/2021   Moderna Covid-19 Vaccine Bivalent Booster 66yrs & up 10/25/2020   Moderna Sars-Covid-2 Vaccination 03/04/2019, 04/01/2019, 12/05/2019, 08/02/2020   PFIZER Comirnaty(Gray Top)Covid-19 Tri-Sucrose Vaccine 02/11/2022   Pneumococcal Conjugate-13 11/25/2019   Pneumococcal Polysaccharide-23 10/09/2018   Zoster Recombinant(Shingrix) 10/09/2018, 10/09/2018, 01/01/2019    TDAP status: Due, Education has  been provided regarding the importance of this vaccine. Advised may receive this vaccine at local pharmacy or Health Dept. Aware to provide a copy of the vaccination record if obtained from local pharmacy or Health Dept. Verbalized acceptance and understanding.  Flu Vaccine status: Due, Education has been provided regarding the importance of this vaccine. Advised may receive this vaccine at local pharmacy or Health Dept. Aware to provide a copy of the vaccination record if obtained from local pharmacy or Health Dept. Verbalized acceptance and understanding.  Pneumococcal vaccine status: Up to date  Covid-19 vaccine status: Information provided on how to obtain vaccines.   Qualifies for Shingles Vaccine? No   Zostavax completed No   Shingrix Completed?: Yes  Screening Tests Health Maintenance  Topic Date Due   DTaP/Tdap/Td (1 - Tdap) Never done   Medicare Annual Wellness (AWV)  11/28/2020  COVID-19 Vaccine (7 - 2023-24 season) 04/08/2022   INFLUENZA VACCINE  08/22/2022   Colonoscopy  07/26/2031   Pneumonia Vaccine 68+ Years old  Completed   Zoster Vaccines- Shingrix  Completed   HPV VACCINES  Aged Out   Hepatitis C Screening  Discontinued    Health Maintenance  Health Maintenance Due  Topic Date Due   DTaP/Tdap/Td (1 - Tdap) Never done   Medicare Annual Wellness (AWV)  11/28/2020   COVID-19 Vaccine (7 - 2023-24 season) 04/08/2022   INFLUENZA VACCINE  08/22/2022    Colorectal cancer screening: Type of screening: Colonoscopy. Completed 07/25/2021. Repeat every 10 years  Lung Cancer Screening: (Low Dose CT Chest recommended if Age 24-80 years, 20 pack-year currently smoking OR have quit w/in 15years.) does not qualify.   Lung Cancer Screening Referral: Last lung cancer screening completed on 04/09/2021. DX with non-small cell lung cancer. Currently undergoing treatment  Additional Screening:  Hepatitis C Screening: does not qualify; Completed 03/28/2022  Vision Screening:  Recommended annual ophthalmology exams for early detection of glaucoma and other disorders of the eye. Is the patient up to date with their annual eye exam?  Yes  Who is the provider or what is the name of the office in which the patient attends annual eye exams? Patient can't remember name of provider If pt is not established with a provider, would they like to be referred to a provider to establish care? No .   Dental Screening: Recommended annual dental exams for proper oral hygiene  Diabetic Foot Exam: n/a  Community Resource Referral / Chronic Care Management: CRR required this visit?  Yes   CCM required this visit?  No     Plan:     I have personally reviewed and noted the following in the patient's chart:   Medical and social history Use of alcohol, tobacco or illicit drugs  Current medications and supplements including opioid prescriptions. Patient is currently taking opioid prescriptions. Information provided to patient regarding non-opioid alternatives. Patient advised to discuss non-opioid treatment plan with their provider. Functional ability and status Nutritional status Physical activity Advanced directives List of other physicians Hospitalizations, surgeries, and ER visits in previous 12 months Vitals Screenings to include cognitive, depression, and falls Referrals and appointments  In addition, I have reviewed and discussed with patient certain preventive protocols, quality metrics, and best practice recommendations. A written personalized care plan for preventive services as well as general preventive health recommendations were provided to patient.     Jordan Hawks , CMA   08/30/2022   After Visit Summary: (MyChart) Due to this being a telephonic visit, the after visit summary with patients personalized plan was offered to patient via MyChart   Nurse Notes: CRR referral placed today due to patient having food insecurity.

## 2022-08-31 ENCOUNTER — Other Ambulatory Visit: Payer: Self-pay

## 2022-09-04 ENCOUNTER — Encounter: Payer: Self-pay | Admitting: Internal Medicine

## 2022-09-04 ENCOUNTER — Ambulatory Visit (INDEPENDENT_AMBULATORY_CARE_PROVIDER_SITE_OTHER): Payer: HMO | Admitting: Internal Medicine

## 2022-09-04 ENCOUNTER — Telehealth: Payer: Self-pay | Admitting: *Deleted

## 2022-09-04 VITALS — BP 134/69 | HR 72 | Temp 98.6°F | Ht 70.0 in | Wt 159.7 lb

## 2022-09-04 DIAGNOSIS — D123 Benign neoplasm of transverse colon: Secondary | ICD-10-CM | POA: Diagnosis not present

## 2022-09-04 DIAGNOSIS — K219 Gastro-esophageal reflux disease without esophagitis: Secondary | ICD-10-CM

## 2022-09-04 NOTE — Progress Notes (Signed)
Primary Care Physician:  [name], DO Primary Gastroenterologist:  Dr. Marletta Lor  Chief Complaint  Patient presents with   Follow-up    Patient here today as he was told he needed an office visit prior to his next TCS. Patient denies any current gi issues.    HPI:   Darryl Ward is a 77 y.o. male who presents to the clinic today to discuss scheduling surveillance colonoscopy.  Underwent colonoscopy for surveillance purposes 07/25/2021 was found to have 18 polyps throughout his colon.  Pathology all tubular adenomas.  Recommended 1 year recall.  Since that time he has undergone lobectomy for lung cancer currently receiving immunotherapy.  Also suffered a hip fracture.  Was recently discharged from rehab.  Denies any GI complaints for me today.  No melena hematochezia.    Does have chronic reflux which is well-controlled on pantoprazole daily.   Past Medical History:  Diagnosis Date   AAA (abdominal aortic aneurysm) (HCC)    Arthritis    Cancer (HCC)    Carotid artery disease (HCC)    Nonobstructive   Cataract    COPD (chronic obstructive pulmonary disease) (HCC)    Coronary atherosclerosis of native coronary artery    PTCA small diagonal 2007 otherwise nonobstructive CAD   Depression    Dysrhythmia    Essential hypertension, benign    Hyperlipidemia    NSTEMI (non-ST elevated myocardial infarction) (HCC) 2007   Stroke [facility]) 2004   TIA (transient ischemic attack) 2006    Past Surgical History:  Procedure Laterality Date   AORTA - BILATERAL FEMORAL ARTERY BYPASS GRAFT  01/08/2012   Procedure: AORTA BIFEMORAL BYPASS GRAFT;  Surgeon: Sherren Kerns, MD;  Location: [facility] OR;  Service: Vascular;  Laterality: Bilateral;  using 18x74mm x 40cm Hemashield Gold Vascular Graft with Endarterectomy, Thombectomy and  Reimplantation of Inferior Mesenteric Artery   BACK SURGERY  2021   BRONCHIAL BIOPSY  06/11/2021   Procedure: BRONCHIAL BIOPSIES;  Surgeon: Leslye Peer, MD;   Location: [facility] ENDOSCOPY;  Service: Pulmonary;;   BRONCHIAL BRUSHINGS  06/11/2021   Procedure: BRONCHIAL BRUSHINGS;  Surgeon: Leslye Peer, MD;  Location: [facility] ENDOSCOPY;  Service: Pulmonary;;   BRONCHIAL NEEDLE ASPIRATION BIOPSY  06/11/2021   Procedure: BRONCHIAL NEEDLE ASPIRATION BIOPSIES;  Surgeon: Leslye Peer, MD;  Location: [facility] ENDOSCOPY;  Service: Pulmonary;;   CARDIOVERSION N/A 09/20/2021   Procedure: CARDIOVERSION;  Surgeon: [name], MD;  Location: [facility] ORS;  Service: Cardiovascular;  Laterality: N/A;   COLONOSCOPY N/A 04/14/2019   Procedure: COLONOSCOPY;  Surgeon: Corbin Ade, MD;  Location: [facility] ENDO SUITE;  Service: Endoscopy;  Laterality: N/A;  9:30   COLONOSCOPY WITH PROPOFOL N/A 07/25/2021   Procedure: COLONOSCOPY WITH PROPOFOL;  Surgeon: Lanelle Bal, DO;  Location: [facility] ENDO SUITE;  Service: Endoscopy;  Laterality: N/A;  1:00pm   FIDUCIAL MARKER PLACEMENT  06/11/2021   Procedure: FIDUCIAL MARKER PLACEMENT;  Surgeon: Leslye Peer, MD;  Location: [facility] ENDOSCOPY;  Service: Pulmonary;;   HIP ARTHROPLASTY Right 04/28/2022   Procedure: ARTHROPLASTY BIPOLAR HIP (HEMIARTHROPLASTY);  Surgeon: Sheral Apley, MD;  Location: [facility] OR;  Service: Orthopedics;  Laterality: Right;   INTERCOSTAL NERVE BLOCK Right 11/12/2021   Procedure: INTERCOSTAL NERVE BLOCK;  Surgeon: Loreli Slot, MD;  Location: [facility] OR;  Service: Thoracic;  Laterality: Right;   IR IMAGING GUIDED PORT INSERTION  07/31/2021   Left cataract surgery     LYMPH NODE DISSECTION Right 11/12/2021   Procedure: LYMPH  NODE DISSECTION;  Surgeon: Loreli Slot, MD;  Location: [facility] OR;  Service: Thoracic;  Laterality: Right;   POLYPECTOMY  04/14/2019   Procedure: POLYPECTOMY;  Surgeon: Corbin Ade, MD;  [facility] END[facility];  Service: Endoscopy;;   POLYPECTOMY  07/25/2021   Procedure: POLYPECTOMY;  Surgeon: Lanelle Bal, DO;  Location: [facility] ENDO SUITE;  Service: Endoscopy;;   TEE WITHOUT  CARDIOVERSION N/A 09/20/2021   Procedure: TRANSESOPHAGEAL ECHOCARDIOGRAM (TEE);  Surgeon: [name], MD;  Location: [facility] ORS;  Service: Cardiovascular;  Laterality: N/A;   TRANSFORAMINAL LUMBAR INTERBODY FUSION (TLIF) WITH PEDICLE SCREW FIXATION 1 LEVEL N/A 04/27/2020   Procedure: Transforaminal Lumbar Interbody Fusion Lumbar Five-Sacral One;  Surgeon: Bedelia Person, MD;  Location: [facility] OR;  Service: Neurosurgery;  Laterality: N/A;   VIDEO BRONCHOSCOPY WITH INSERTION OF INTERBRONCHIAL VALVE (IBV) N/A 11/22/2021   Procedure: VIDEO BRONCHOSCOPY WITH INSERTION OF INTERBRONCHIAL VALVE (IBV);  Surgeon: Loreli Slot, MD;  Location: [facility] OR;  Service: Thoracic;  Laterality: N/A;   VIDEO BRONCHOSCOPY WITH INSERTION OF INTERBRONCHIAL VALVE (IBV) N/A 01/24/2022   Procedure: VIDEO BRONCHOSCOPY WITH REMOVAL OF INTERBRONCHIAL VALVE (IBV);  Surgeon: Loreli Slot, MD;  Location: [facility] OR;  Service: Thoracic;  Laterality: N/A;   VIDEO BRONCHOSCOPY WITH RADIAL ENDOBRONCHIAL ULTRASOUND  06/11/2021   Procedure: VIDEO BRONCHOSCOPY WITH RADIAL ENDOBRONCHIAL ULTRASOUND;  Surgeon: Leslye Peer, MD;  Location: [facility] ENDOSCOPY;  Service: Pulmonary;;    Current Outpatient Medications  Medication Sig Dispense Refill   acetaminophen (TYLENOL) 500 MG tablet GIVE 2 TABLETS (1,000 mg TOTAL) BY MOUTH EVERY 6 HOURS AS NEEDED FOR PAIN 60 tablet 0   albuterol (VENTOLIN HFA) 108 (90 Base) MCG/ACT inhaler Inhale 2 puffs into the lungs every 6 (six) hours as needed for wheezing or shortness of breath. 6.7 g 6   apixaban (ELIQUIS) 5 MG TABS tablet Take 1 tablet (5 mg total) by mouth 2 (two) times daily. 180 tablet 3   atorvastatin (LIPITOR) 80 MG tablet Take 1 tablet (80 mg total) by mouth daily. 90 tablet 3   cyclobenzaprine (FLEXERIL) 10 MG tablet Take 1 tablet (10 mg total) by mouth 2 (two) times daily as needed. 60 tablet 2   escitalopram (LEXAPRO) 10 MG tablet Take 1 tablet (10 mg total) by mouth daily. 90  tablet 3   folic acid (KP FOLIC ACID) 1 MG tablet Take 1 tablet (1 mg total) by mouth daily. 90 tablet 3   furosemide (LASIX) 20 MG tablet Take 1 tablet (20 mg total) by mouth daily as needed. 90 tablet 2   HYDROcodone-acetaminophen (NORCO/VICODIN) 5-325 MG tablet Take 1 tablet by mouth every 6 (six) hours as needed, for pain. 60 tablet 0   magnesium oxide (MAG-OX) 400 (240 Mg) MG tablet Take 1 tablet (400 mg total) by mouth daily. 120 tablet 3   metoprolol succinate (TOPROL-XL) 25 MG 24 hr tablet Take 3 tablets (75 mg total) by mouth daily. Take with or immediately following a meal. Do Not Crush. (Patient taking differently: Take 50 mg by mouth daily.) 270 tablet 3   pantoprazole (PROTONIX) 40 MG tablet Take 1 tablet (40 mg total) by mouth daily. 90 tablet 3   tamsulosin (FLOMAX) 0.4 MG CAPS capsule Take 1 capsule (0.4 mg total) by mouth daily. 90 capsule 3   Tiotropium Bromide Monohydrate (SPIRIVA RESPIMAT) 2.5 MCG/ACT AERS Inhale 2 puffs into the lungs daily. 18 g 3   No current facility-administered medications for this visit.   Facility-Administered Medications Ordered  in Other Visits  Medication Dose Route Frequency Provider Last Rate Last Admin   sodium chloride flush (NS) 0.9 % injection 10 mL  10 mL Intracatheter PRN [name], MD   10 mL at 07/10/22 1513    Allergies as of 09/04/2022   (No Known Allergies)    Family History  Problem Relation Age of Onset   Hyperlipidemia Sister    Heart attack Brother 58   Cancer - Colon Neg Hx     Social History   Socioeconomic History   Marital status: Divorced    Spouse name: Not on file   Number of children: 1   Years of education: 11   Highest education level: 11th grade  Occupational History    Employer: Engineer, materials  Tobacco Use   Smoking status: Former    Current packs/day: 0.00    Average packs/day: 1 pack/day for 40.0 years (40.0 ttl pk-yrs)    Types: Cigarettes    Start date: 07/1981    Quit date:  07/2021    Years since quitting: 1.1   Smokeless tobacco: Never   Tobacco comments:    1 pack of cigarettes smoked daily. 07/17/21 ARJ, RN   Vaping Use   Vaping status: Never Used  Substance and Sexual Activity   Alcohol use: No    Comment: Prior history of regular alcohol use   Drug use: No   Sexual activity: Not Currently  Other Topics Concern   Not on file  Social History Narrative   Not on file   Social Determinants of Health   Financial Resource Strain: High Risk (08/30/2022)   Overall Financial Resource Strain (CARDIA)    Difficulty of Paying Living Expenses: Hard  Food Insecurity: Food Insecurity Present (08/30/2022)   Hunger Vital Sign    Worried About Running Out of Food in the Last Year: Sometimes true    Ran Out of Food in the Last Year: Sometimes true  Transportation Needs: No Transportation Needs (08/30/2022)   PRAPARE - Administrator, Civil Service (Medical): No    Lack of Transportation (Non-Medical): No  Recent Concern: Transportation Needs - Unmet Transportation Needs (06/11/2022)   PRAPARE - Transportation    Lack of Transportation (Medical): Yes    Lack of Transportation (Non-Medical): Yes  Physical Activity: Insufficiently Active (08/30/2022)   Exercise Vital Sign    Days of Exercise per Week: 7 days    Minutes of Exercise per Session: 20 min  Stress: No Stress Concern Present (08/30/2022)   [employer] of Occupational Health - Occupational Stress Questionnaire    Feeling of Stress : Not at all  Social Connections: Socially Isolated (08/30/2022)   Social Connection and Isolation Panel [NHANES]    Frequency of Communication with Friends and Family: Twice a week    Frequency of Social Gatherings with Friends and Family: Twice a week    Attends Religious Services: Never    Database administrator or Organizations: No    Attends Banker Meetings: Never    Marital Status: Divorced  Catering manager Violence: Not At Risk (08/30/2022)    Humiliation, Afraid, Rape, and Kick questionnaire    Fear of Current or Ex-Partner: No    Emotionally Abused: No    Physically Abused: No    Sexually Abused: No    Subjective: Review of Systems  Constitutional:  Negative for chills and fever.  HENT:  Negative for congestion and hearing loss.   Eyes:  Negative for blurred vision  and double vision.  Respiratory:  Negative for cough and shortness of breath.   Cardiovascular:  Negative for chest pain and palpitations.  Gastrointestinal:  Negative for abdominal pain, blood in stool, constipation, diarrhea, heartburn, melena and vomiting.  Genitourinary:  Negative for dysuria and urgency.  Musculoskeletal:  Negative for joint pain and myalgias.  Skin:  Negative for itching and rash.  Neurological:  Negative for dizziness and headaches.  Psychiatric/Behavioral:  Negative for depression. The patient is not nervous/anxious.        Objective: BP 134/69 (BP Location: Left Arm, Patient Position: Sitting, Cuff Size: Normal)   Pulse 72   Temp 98.6 F (37 C) (Temporal)   Ht 5\' 10"  (1.778 m)   Wt 159 lb 11.2 oz (72.4 kg)   BMI 22.91 kg/m  Physical Exam Constitutional:      Appearance: Normal appearance.  HENT:     Head: Normocephalic and atraumatic.  Eyes:     Extraocular Movements: Extraocular movements intact.     Conjunctiva/sclera: Conjunctivae normal.  Cardiovascular:     Rate and Rhythm: Normal rate and regular rhythm.  Pulmonary:     Effort: Pulmonary effort is normal.     Breath sounds: Normal breath sounds.  Abdominal:     General: Bowel sounds are normal.     Palpations: Abdomen is soft.  Musculoskeletal:        General: Normal range of motion.     Cervical back: Normal range of motion and neck supple.  Skin:    General: Skin is warm.  Neurological:     General: No focal deficit present.     Mental Status: He is alert and oriented to person, place, and time.  Psychiatric:        Mood and Affect: Mood normal.         Behavior: Behavior normal.      Assessment: *Chronic GERD-well-controlled on pantoprazole daily *Adenomatous colon polyps  Plan: Chronic GERD well-controlled on pantoprazole daily.  We will continue.  New for surveillance colonoscopy as he had 18 tubular adenomas removed 1 year ago.  That being said, he has had quite an eventful time since then with lung cancer treatment including lobectomy, recent hip fracture.  We will delay colonoscopy for now which patient is in agreement with.  Follow-up in 6 months or sooner if needed.  09/04/2022 3:00 PM   Disclaimer: This note was dictated with voice recognition software. Similar sounding words can inadvertently be transcribed and may not be corrected upon review.

## 2022-09-04 NOTE — Patient Instructions (Signed)
We will hold off on scheduling your colonoscopy for surveillance purposes.  Follow-up with me in 6 months and we can discuss having it done then.  I am happy to hear that you got through your lung surgery okay and that you are cancer free.  That is fantastic!  It was very nice seeing both you today.  Dr. Marletta Lor

## 2022-09-04 NOTE — Progress Notes (Unsigned)
  Care Coordination  Outreach Note  09/04/2022 Name: JIVAN YARMAN MRN: [id number] DOB: 11-10-1945   Care Coordination Outreach Attempts: An unsuccessful telephone outreach was attempted today to offer the patient information about available care coordination services.  Follow Up Plan:  Additional outreach attempts will be made to offer the patient care coordination information and services.   Encounter Outcome:  No Answer  Burman Nieves, CCMA Care Coordination Care Guide Direct Dial: [phone]

## 2022-09-05 ENCOUNTER — Other Ambulatory Visit (HOSPITAL_COMMUNITY): Payer: Self-pay

## 2022-09-05 ENCOUNTER — Other Ambulatory Visit: Payer: Self-pay

## 2022-09-05 NOTE — Progress Notes (Signed)
  Care Coordination   Note   09/05/2022 Name: Darryl Ward MRN: [id number] DOB: [birth year]  Darryl Ward is a 77 y.o. year old male who sees [name], DO for primary care. I reached out to Darryl Ward by phone today to offer care coordination services.  Mr. Bhardwaj was given information about Care Coordination services today including:   The Care Coordination services include support from the care team which includes your Nurse Coordinator, Clinical Social Worker, or Pharmacist.  The Care Coordination team is here to help remove barriers to the health concerns and goals most important to you. Care Coordination services are voluntary, and the patient may decline or stop services at any time by request to their care team member.   Care Coordination Consent Status: Patient agreed to services and verbal consent obtained.   Follow up plan:  Telephone appointment with care coordination team member scheduled for:  09/13/2022  Encounter Outcome:  Pt. Scheduled from referral   Burman Nieves, [facility] Care Coordination Care Guide Direct Dial: [phone]

## 2022-09-11 ENCOUNTER — Inpatient Hospital Stay: Payer: HMO | Attending: Hematology

## 2022-09-11 ENCOUNTER — Inpatient Hospital Stay: Payer: HMO

## 2022-09-11 ENCOUNTER — Inpatient Hospital Stay: Payer: HMO | Admitting: Hematology

## 2022-09-11 VITALS — BP 117/63 | HR 57 | Temp 97.2°F | Resp 18

## 2022-09-11 DIAGNOSIS — Z5112 Encounter for antineoplastic immunotherapy: Secondary | ICD-10-CM | POA: Insufficient documentation

## 2022-09-11 DIAGNOSIS — C3411 Malignant neoplasm of upper lobe, right bronchus or lung: Secondary | ICD-10-CM | POA: Insufficient documentation

## 2022-09-11 LAB — COMPREHENSIVE METABOLIC PANEL
ALT: 17 U/L (ref 0–44)
AST: 24 U/L (ref 15–41)
Albumin: 3.4 g/dL — ABNORMAL LOW (ref 3.5–5.0)
Alkaline Phosphatase: 60 U/L (ref 38–126)
Anion gap: 9 (ref 5–15)
BUN: 17 mg/dL (ref 8–23)
CO2: 24 mmol/L (ref 22–32)
Calcium: 8.9 mg/dL (ref 8.9–10.3)
Chloride: 104 mmol/L (ref 98–111)
Creatinine, Ser: 1.03 mg/dL (ref 0.61–1.24)
GFR, Estimated: >60 mL/min (ref >60)
Glucose, Bld: 82 mg/dL (ref 70–99)
Potassium: 3.7 mmol/L (ref 3.5–5.1)
Sodium: 137 mmol/L (ref 135–145)
Total Bilirubin: 0.8 mg/dL (ref 0.3–1.2)
Total Protein: 6.4 g/dL — ABNORMAL LOW (ref 6.5–8.1)

## 2022-09-11 LAB — CBC WITH DIFFERENTIAL/PLATELET
Abs Immature Granulocytes: 0.02 10*3/uL (ref 0.00–0.07)
Basophils Absolute: 0.1 10*3/uL (ref 0.0–0.1)
Basophils Relative: 1 %
Eosinophils Absolute: 0.2 10*3/uL (ref 0.0–0.5)
Eosinophils Relative: 3 %
HCT: 33.8 % — ABNORMAL LOW (ref 39.0–52.0)
Hemoglobin: 10.8 g/dL — ABNORMAL LOW (ref 13.0–17.0)
Immature Granulocytes: 0 %
Lymphocytes Relative: 20 %
Lymphs Abs: 1.3 10*3/uL (ref 0.7–4.0)
MCH: 31.7 pg (ref 26.0–34.0)
MCHC: 32 g/dL (ref 30.0–36.0)
MCV: 99.1 fL (ref 80.0–100.0)
Monocytes Absolute: 0.6 10*3/uL (ref 0.1–1.0)
Monocytes Relative: 9 %
Neutro Abs: 4.5 10*3/uL (ref 1.7–7.7)
Neutrophils Relative %: 67 %
Platelets: 136 10*3/uL — ABNORMAL LOW (ref 150–400)
RBC: 3.41 MIL/uL — ABNORMAL LOW (ref 4.22–5.81)
RDW: 14.1 % (ref 11.5–15.5)
WBC: 6.8 10*3/uL (ref 4.0–10.5)
nRBC: 0 % (ref 0.0–0.2)

## 2022-09-11 LAB — MAGNESIUM: Magnesium: 1.8 mg/dL (ref 1.7–2.4)

## 2022-09-11 MED ORDER — SODIUM CHLORIDE 0.9 % IV SOLN
200.0000 mg | Freq: Once | INTRAVENOUS | Status: AC
  Administered 2022-09-11: 200 mg via INTRAVENOUS
  Filled 2022-09-11: qty 8

## 2022-09-11 MED ORDER — SODIUM CHLORIDE 0.9% FLUSH
10.0000 mL | INTRAVENOUS | Status: DC | PRN
  Administered 2022-09-11: 10 mL

## 2022-09-11 MED ORDER — SODIUM CHLORIDE 0.9% FLUSH
10.0000 mL | Freq: Once | INTRAVENOUS | Status: AC
  Administered 2022-09-11: 10 mL via INTRAVENOUS

## 2022-09-11 MED ORDER — SODIUM CHLORIDE 0.9 % IV SOLN: Freq: Once | INTRAVENOUS | Status: AC

## 2022-09-11 MED ORDER — HEPARIN SOD (PORK) LOCK FLUSH 100 UNIT/ML IV SOLN
500.0000 [IU] | Freq: Once | INTRAVENOUS | Status: AC | PRN
  Administered 2022-09-11: 500 [IU]

## 2022-09-11 NOTE — Patient Instructions (Signed)
[facility] [place]  Discharge Instructions: Thank you for choosing [facility] to provide your oncology and hematology care.  If you have a lab appointment with the Cancer Center - please note that after April 8th, 2024, all labs will be drawn in the cancer center.  You do not have to check in or register with the main entrance as you have in the past but will complete your check-in in the cancer center.  Wear comfortable clothing and clothing appropriate for easy access to any Portacath or PICC line.   We strive to give you quality time with your provider. You may need to reschedule your appointment if you arrive late (15 or more minutes).  Arriving late affects you and other patients whose appointments are after yours.  Also, if you miss three or more appointments without notifying the office, you may be dismissed from the clinic at the provider's discretion.      For prescription refill requests, have your pharmacy contact our office and allow 72 hours for refills to be completed.    Today you received the following chemotherapy and/or immunotherapy agents Keytruda      To help prevent nausea and vomiting after your treatment, we encourage you to take your nausea medication as directed.  BELOW ARE SYMPTOMS THAT SHOULD BE REPORTED IMMEDIATELY: *FEVER GREATER THAN 100.4 F (38 C) OR HIGHER *CHILLS OR SWEATING *NAUSEA AND VOMITING THAT IS NOT CONTROLLED WITH YOUR NAUSEA MEDICATION *UNUSUAL SHORTNESS OF BREATH *UNUSUAL BRUISING OR BLEEDING *URINARY PROBLEMS (pain or burning when urinating, or frequent urination) *BOWEL PROBLEMS (unusual diarrhea, constipation, pain near the anus) TENDERNESS IN MOUTH AND THROAT WITH OR WITHOUT PRESENCE OF ULCERS (sore throat, sores in mouth, or a toothache) UNUSUAL RASH, SWELLING OR PAIN  UNUSUAL VAGINAL DISCHARGE OR ITCHING   Items with * indicate a potential emergency and should be followed up as soon as possible or go to the  Emergency Department if any problems should occur.  Please show the CHEMOTHERAPY ALERT CARD or IMMUNOTHERAPY ALERT CARD at check-in to the Emergency Department and triage nurse.  Should you have questions after your visit or need to cancel or reschedule your appointment, please contact [facility] [place] [place] [phone]  and follow the prompts.  Office hours are 8:00 a.m. to 4:30 p.m. Monday - Friday. Please note that voicemails left after 4:00 p.m. may not be returned until the following business day.  We are closed weekends and major holidays. You have access to a nurse at all times for urgent questions. Please call the main number to the clinic [phone] and follow the prompts.  For any non-urgent questions, you may also contact your provider using MyChart. We now offer e-Visits for anyone 18 and older to request care online for non-urgent symptoms. For details visit [email or web address].   Also download the MyChart app! Go to the app store, search "MyChart", open the app, select [place], and log in with your MyChart username and password.   

## 2022-09-11 NOTE — Progress Notes (Signed)
Patient presents today for Keytruda infusion per provider order.  Vital signs and labs within parameters for treatment.  Patient has no new complaints at this time.  Treatment given today per MD orders.  Stable during infusion without adverse affects.  Vital signs stable.  No complaints at this time.  Discharge from clinic ambulatory in stable condition.  Alert and oriented X 3.  Follow up with [facility] as scheduled.

## 2022-09-13 ENCOUNTER — Ambulatory Visit: Payer: Self-pay

## 2022-09-13 NOTE — Patient Instructions (Signed)
Visit Information  Thank you for taking time to visit with me today. Please don't hesitate to contact me if I can be of assistance to you.   Following are the goals we discussed today:  SW to provide a list of food banks.  Patient to contact food banks for additional assistance.  If you are experiencing a Mental Health or Behavioral Health Crisis or need someone to talk to, please call 911  Patient verbalizes understanding of instructions and care plan provided today and agrees to view in MyChart. Active MyChart status and patient understanding of how to access instructions and care plan via MyChart confirmed with patient.     No further follow up required:    Lysle Morales, BSW Social Worker [phone]

## 2022-09-13 NOTE — Patient Outreach (Signed)
  Care Coordination   Initial Visit Note   09/13/2022 Name: Darryl Ward MRN: [id number] DOB: 05/26/1945  Darryl Ward is a 77 y.o. year old male who sees Darryl Sams, DO for primary care. I spoke with  Darryl Ward ex-wife Darryl Ward by phone today.  What matters to the patients health and wellness today?  It is reported that patient needs additional assistance with food.   Goals Addressed             This Visit's Progress    Food       Interventions Today    Flowsheet Row Most Recent Value  Chronic Disease   Chronic disease during today's visit Chronic Obstructive Pulmonary Disease (COPD), Congestive Heart Failure (CHF), Hypertension (HTN), Other  [Cancer]  General Interventions   General Interventions Discussed/Reviewed General Interventions Discussed, General Interventions Reviewed, [place] is reported that pt receives extra assistance from [facility],  Foodstamps and [employer] (from [employer]) but needs additional help with food.  Sw will provide a list of food banks in the community.]              SDOH assessments and interventions completed:  Yes  SDOH Interventions Today    Flowsheet Row Most Recent Value  SDOH Interventions   Food Insecurity Interventions Intervention Not Indicated, Other (Comment)  [Boost assistance from Cancer Center and Foodstamps]  Housing Interventions Intervention Not Indicated  Transportation Interventions Intervention Not Indicated  Utilities Interventions Intervention Not Indicated        Care Coordination Interventions:  Yes, provided   Follow up plan: No further intervention required.   Encounter Outcome:  Pt. Visit Completed

## 2022-09-18 ENCOUNTER — Encounter: Payer: Self-pay | Admitting: Hematology

## 2022-09-23 ENCOUNTER — Other Ambulatory Visit (HOSPITAL_COMMUNITY): Payer: Self-pay

## 2022-09-25 ENCOUNTER — Encounter: Payer: Self-pay | Admitting: Family Medicine

## 2022-09-25 ENCOUNTER — Encounter: Payer: Self-pay | Admitting: Hematology

## 2022-09-25 ENCOUNTER — Ambulatory Visit (INDEPENDENT_AMBULATORY_CARE_PROVIDER_SITE_OTHER): Payer: HMO | Admitting: Family Medicine

## 2022-09-25 VITALS — BP 97/55 | HR 62 | Temp 98.4°F | Wt 170.8 lb

## 2022-09-25 DIAGNOSIS — I10 Essential (primary) hypertension: Secondary | ICD-10-CM

## 2022-09-25 DIAGNOSIS — Z23 Encounter for immunization: Secondary | ICD-10-CM | POA: Diagnosis not present

## 2022-09-25 DIAGNOSIS — R600 Localized edema: Secondary | ICD-10-CM | POA: Diagnosis not present

## 2022-09-25 DIAGNOSIS — J449 Chronic obstructive pulmonary disease, unspecified: Secondary | ICD-10-CM | POA: Diagnosis not present

## 2022-09-25 NOTE — Patient Instructions (Signed)
Continue your medications.  Elevate the legs.  Follow up in 6 months.  Take care  Dr. Adriana Simas

## 2022-09-26 DIAGNOSIS — R600 Localized edema: Secondary | ICD-10-CM | POA: Insufficient documentation

## 2022-09-26 NOTE — Assessment & Plan Note (Signed)
Does not appear to be fluid overloaded.  Advised to hold Lasix given soft pressure.  Advised elevation.  Venous stasis likely contributing.

## 2022-09-26 NOTE — Progress Notes (Signed)
Subjective:  Patient ID: Darryl Ward, male    DOB: 09/04/1945  Age: 77 y.o. MRN: [id number]  CC:  Follow up   HPI:  77 year old male with an extensive past medical history as seen below presents for follow-up.  Patient states that overall he is doing okay.  He does note that he is recently had lower extremity swelling.  Significant other states that this has been about 10 days but has been worse as of the past couple of days.  BP 97/55 today.  He is on Lasix and metoprolol.  He is not on amlodipine.  He does a lot of sitting.  He is a high fall risk.  Denies chest pain.  Baseline shortness of breath.  Patient Active Problem List   Diagnosis Date Noted   Lower extremity edema 09/26/2022   Benign prostatic hyperplasia with lower urinary tract symptoms 06/13/2022   Other intervertebral disc degeneration, lumbar region 05/01/2022   Hip fracture (HCC) 04/28/2022   Normocytic anemia 04/28/2022   Iron deficiency anemia 04/22/2022   History of stroke 03/27/2022   Chronic combined systolic and diastolic CHF (congestive heart failure) (HCC) 03/27/2022   PAF (paroxysmal atrial fibrillation) (HCC) 12/23/2021   Essential hypertension 09/30/2021   Primary adenocarcinoma of upper lobe of right lung (HCC) 06/26/2021   COPD (chronic obstructive pulmonary disease) (HCC) 05/29/2021   Lumbar spinal stenosis 02/06/2020   Meningioma, cerebral (HCC) 02/05/2020   Chronic midline low back pain without sciatica 04/27/2019   Tobacco abuse 11/17/2018   PVD (peripheral vascular disease) (HCC) 05/21/2012   Mixed hyperlipidemia 12/13/2011   Coronary atherosclerosis of native coronary artery 12/13/2011    Social Hx   Social History   Socioeconomic History   Marital status: Divorced    Spouse name: Not on file   Number of children: 1   Years of education: 11   Highest education level: 11th grade  Occupational History    Employer: Engineer, [employer]  Tobacco Use   Smoking status: Former     Current packs/day: 0.00    Average packs/day: 1 pack/day for 40.0 years (40.0 ttl pk-yrs)    Types: Cigarettes    Start date: 07/1981    Quit date: 07/2021    Years since quitting: 1.1   Smokeless tobacco: Never   Tobacco comments:    1 pack of cigarettes smoked daily. 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[facility], RN   Vaping Use   Vaping status: Never Used  Substance and Sexual Activity   Alcohol use: No    Comment: Prior history of regular alcohol use   Drug use: No   Sexual activity: Not Currently  Other Topics Concern   Not on file  Social History Narrative   Not on file   Social Determinants of Health   Financial Resource Strain: High Risk (08/30/2022)   Overall Financial Resource Strain (CARDIA)    Difficulty of Paying Living Expenses: Hard  Food Insecurity: Food Insecurity Present (09/13/2022)   Hunger Vital Sign    Worried About Running Out of Food in the Last Year: Never true    Ran Out of Food in the Last Year: Sometimes true  Transportation Needs: No Transportation Needs (09/13/2022)   PRAPARE - Administrator, Civil Service (Medical): No    Lack of Transportation (Non-Medical): No  Physical Activity: Insufficiently Active (08/30/2022)   Exercise Vital Sign    Days of Exercise per Week: 7 days    Minutes of Exercise per Session: 20 min  Stress: No Stress  Concern Present (08/30/2022)   [employer] of Occupational Health - Occupational Stress Questionnaire    Feeling of Stress : Not at all  Social Connections: Socially Isolated (08/30/2022)   Social Connection and Isolation Panel [NHANES]    Frequency of Communication with Friends and Family: Twice a week    Frequency of Social Gatherings with Friends and Family: Twice a week    Attends Religious Services: Never    Diplomatic Services operational officer: No    Attends Engineer, structural: Never    Marital Status: Divorced    Review of Systems Per HPI  Objective:  BP (!) 97/55   Pulse 62   Temp 98.4 F (36.9 C)    Wt 170 lb 12.8 oz (77.5 kg)   SpO2 100%   BMI 24.51 kg/m      09/25/2022    2:16 PM 09/11/2022    2:07 PM 09/11/2022   12:07 PM  BP/Weight  Systolic BP 97 117 115  Diastolic BP 55 63 65  Wt. (Lbs) 170.8    BMI 24.51 kg/m2      Physical Exam Constitutional:      General: He is not in acute distress. HENT:     Head: Normocephalic and atraumatic.  Eyes:     General:        Right eye: No discharge.        Left eye: No discharge.     Conjunctiva/sclera: Conjunctivae normal.  Cardiovascular:     Rate and Rhythm: Normal rate and regular rhythm.     Comments: 1+ lower extremity edema. Pulmonary:     Effort: Pulmonary effort is normal.     Breath sounds: No wheezing.  Abdominal:     General: There is no distension.     Palpations: Abdomen is soft.  Neurological:     Mental Status: He is alert.     Lab Results  Component Value Date   WBC 6.8 09/11/2022   HGB 10.8 (L) 09/11/2022   HCT 33.8 (L) 09/11/2022   PLT 136 (L) 09/11/2022   GLUCOSE 82 09/11/2022   CHOL 202 (H) 02/05/2020   TRIG 99 02/05/2020   HDL 29 (L) 02/05/2020   LDLCALC 153 (H) 02/05/2020   ALT 17 09/11/2022   AST 24 09/11/2022   NA 137 09/11/2022   K 3.7 09/11/2022   CL 104 09/11/2022   CREATININE 1.03 09/11/2022   BUN 17 09/11/2022   CO2 24 09/11/2022   TSH 1.209 08/21/2022   INR 1.3 (H) 04/27/2022   HGBA1C 5.1 02/05/2020     Assessment & Plan:   Problem List Items Addressed This Visit       Cardiovascular and Mediastinum   Essential hypertension - Primary    Advised to hold Lasix.  Continue metoprolol.  This has recently been decreased.        Respiratory   COPD (chronic obstructive pulmonary disease) (HCC)    Stable.        Other   Lower extremity edema    Does not appear to be fluid overloaded.  Advised to hold Lasix given soft pressure.  Advised elevation.  Venous stasis likely contributing.      Other Visit Diagnoses     Needs flu shot       Relevant Orders   Flu Vaccine  Trivalent High Dose (Fluad) (Completed)       Follow-up:  6 months  Aryana Wonnacott Adriana Simas DO [facility]

## 2022-09-26 NOTE — Assessment & Plan Note (Signed)
Advised to hold Lasix.  Continue metoprolol.  This has recently been decreased.

## 2022-09-26 NOTE — Assessment & Plan Note (Signed)
Stable

## 2022-09-27 ENCOUNTER — Ambulatory Visit: Payer: HMO | Admitting: Family Medicine

## 2022-10-02 ENCOUNTER — Ambulatory Visit: Payer: HMO | Admitting: Hematology

## 2022-10-02 ENCOUNTER — Other Ambulatory Visit: Payer: HMO

## 2022-10-02 ENCOUNTER — Ambulatory Visit: Payer: HMO

## 2022-10-08 ENCOUNTER — Inpatient Hospital Stay: Payer: HMO | Attending: Hematology

## 2022-10-08 ENCOUNTER — Inpatient Hospital Stay: Payer: HMO

## 2022-10-08 ENCOUNTER — Inpatient Hospital Stay (HOSPITAL_BASED_OUTPATIENT_CLINIC_OR_DEPARTMENT_OTHER): Payer: HMO | Admitting: Hematology

## 2022-10-08 VITALS — BP 116/58 | HR 66 | Temp 97.7°F | Resp 18

## 2022-10-08 VITALS — BP 99/72 | HR 65 | Temp 97.7°F | Resp 18 | Wt 169.2 lb

## 2022-10-08 DIAGNOSIS — C3411 Malignant neoplasm of upper lobe, right bronchus or lung: Secondary | ICD-10-CM | POA: Insufficient documentation

## 2022-10-08 DIAGNOSIS — I2510 Atherosclerotic heart disease of native coronary artery without angina pectoris: Secondary | ICD-10-CM | POA: Insufficient documentation

## 2022-10-08 DIAGNOSIS — Z5112 Encounter for antineoplastic immunotherapy: Secondary | ICD-10-CM | POA: Insufficient documentation

## 2022-10-08 DIAGNOSIS — Z87891 Personal history of nicotine dependence: Secondary | ICD-10-CM | POA: Insufficient documentation

## 2022-10-08 DIAGNOSIS — E785 Hyperlipidemia, unspecified: Secondary | ICD-10-CM | POA: Insufficient documentation

## 2022-10-08 DIAGNOSIS — Z79899 Other long term (current) drug therapy: Secondary | ICD-10-CM | POA: Diagnosis not present

## 2022-10-08 DIAGNOSIS — I4891 Unspecified atrial fibrillation: Secondary | ICD-10-CM | POA: Insufficient documentation

## 2022-10-08 DIAGNOSIS — J449 Chronic obstructive pulmonary disease, unspecified: Secondary | ICD-10-CM | POA: Diagnosis not present

## 2022-10-08 DIAGNOSIS — Z809 Family history of malignant neoplasm, unspecified: Secondary | ICD-10-CM | POA: Diagnosis not present

## 2022-10-08 DIAGNOSIS — I252 Old myocardial infarction: Secondary | ICD-10-CM | POA: Insufficient documentation

## 2022-10-08 DIAGNOSIS — M129 Arthropathy, unspecified: Secondary | ICD-10-CM | POA: Diagnosis not present

## 2022-10-08 DIAGNOSIS — Z7901 Long term (current) use of anticoagulants: Secondary | ICD-10-CM | POA: Insufficient documentation

## 2022-10-08 DIAGNOSIS — Z8673 Personal history of transient ischemic attack (TIA), and cerebral infarction without residual deficits: Secondary | ICD-10-CM | POA: Insufficient documentation

## 2022-10-08 DIAGNOSIS — D649 Anemia, unspecified: Secondary | ICD-10-CM | POA: Diagnosis not present

## 2022-10-08 DIAGNOSIS — I10 Essential (primary) hypertension: Secondary | ICD-10-CM | POA: Insufficient documentation

## 2022-10-08 DIAGNOSIS — Z801 Family history of malignant neoplasm of trachea, bronchus and lung: Secondary | ICD-10-CM | POA: Diagnosis not present

## 2022-10-08 DIAGNOSIS — I714 Abdominal aortic aneurysm, without rupture, unspecified: Secondary | ICD-10-CM | POA: Insufficient documentation

## 2022-10-08 DIAGNOSIS — Z95828 Presence of other vascular implants and grafts: Secondary | ICD-10-CM

## 2022-10-08 LAB — COMPREHENSIVE METABOLIC PANEL
ALT: 13 U/L (ref 0–44)
AST: 18 U/L (ref 15–41)
Albumin: 3.4 g/dL — ABNORMAL LOW (ref 3.5–5.0)
Alkaline Phosphatase: 81 U/L (ref 38–126)
Anion gap: 7 (ref 5–15)
BUN: 16 mg/dL (ref 8–23)
CO2: 23 mmol/L (ref 22–32)
Calcium: 8.7 mg/dL — ABNORMAL LOW (ref 8.9–10.3)
Chloride: 107 mmol/L (ref 98–111)
Creatinine, Ser: 1.19 mg/dL (ref 0.61–1.24)
GFR, Estimated: >60 mL/min (ref >60)
Glucose, Bld: 94 mg/dL (ref 70–99)
Potassium: 4.3 mmol/L (ref 3.5–5.1)
Sodium: 137 mmol/L (ref 135–145)
Total Bilirubin: 0.5 mg/dL (ref 0.3–1.2)
Total Protein: 6.5 g/dL (ref 6.5–8.1)

## 2022-10-08 LAB — CBC WITH DIFFERENTIAL/PLATELET
Abs Immature Granulocytes: 0.02 10*3/uL (ref 0.00–0.07)
Basophils Absolute: 0.1 10*3/uL (ref 0.0–0.1)
Basophils Relative: 1 %
Eosinophils Absolute: 0.2 10*3/uL (ref 0.0–0.5)
Eosinophils Relative: 3 %
HCT: 32.7 % — ABNORMAL LOW (ref 39.0–52.0)
Hemoglobin: 10.8 g/dL — ABNORMAL LOW (ref 13.0–17.0)
Immature Granulocytes: 0 %
Lymphocytes Relative: 17 %
Lymphs Abs: 1.1 10*3/uL (ref 0.7–4.0)
MCH: 32.5 pg (ref 26.0–34.0)
MCHC: 33 g/dL (ref 30.0–36.0)
MCV: 98.5 fL (ref 80.0–100.0)
Monocytes Absolute: 0.5 10*3/uL (ref 0.1–1.0)
Monocytes Relative: 8 %
Neutro Abs: 4.6 10*3/uL (ref 1.7–7.7)
Neutrophils Relative %: 71 %
Platelets: 137 10*3/uL — ABNORMAL LOW (ref 150–400)
RBC: 3.32 MIL/uL — ABNORMAL LOW (ref 4.22–5.81)
RDW: 13.7 % (ref 11.5–15.5)
WBC: 6.5 10*3/uL (ref 4.0–10.5)
nRBC: 0 % (ref 0.0–0.2)

## 2022-10-08 LAB — MAGNESIUM: Magnesium: 1.9 mg/dL (ref 1.7–2.4)

## 2022-10-08 MED ORDER — SODIUM CHLORIDE 0.9% FLUSH
10.0000 mL | INTRAVENOUS | Status: DC | PRN
  Administered 2022-10-08: 10 mL

## 2022-10-08 MED ORDER — SODIUM CHLORIDE 0.9 % IV SOLN: Freq: Once | INTRAVENOUS | Status: AC

## 2022-10-08 MED ORDER — SODIUM CHLORIDE 0.9 % IV SOLN
200.0000 mg | Freq: Once | INTRAVENOUS | Status: AC
  Administered 2022-10-08: 200 mg via INTRAVENOUS
  Filled 2022-10-08: qty 8

## 2022-10-08 MED ORDER — SODIUM CHLORIDE 0.9% FLUSH
10.0000 mL | INTRAVENOUS | Status: DC | PRN
  Administered 2022-10-08: 10 mL via INTRAVENOUS

## 2022-10-08 MED ORDER — HEPARIN SOD (PORK) LOCK FLUSH 100 UNIT/ML IV SOLN
500.0000 [IU] | Freq: Once | INTRAVENOUS | Status: AC | PRN
  Administered 2022-10-08: 500 [IU]

## 2022-10-08 NOTE — Progress Notes (Signed)
Patient tolerated therapy with no complaints voiced.  Side effects with management reviewed with understanding verbalized.  Port site clean and dry with no bruising or swelling noted at site.  Good blood return noted before and after administration of therapy.  Band aid applied.  Patient left in satisfactory condition with VSS and no s/s of distress noted.

## 2022-10-08 NOTE — Progress Notes (Signed)
Patient has been examined by Dr. Ellin Saba. Vital signs and labs have been reviewed by MD - ANC, Creatinine, LFTs, hemoglobin, and platelets are within treatment parameters per M.D. - pt may proceed with treatment.  Primary RN and pharmacy notified.

## 2022-10-08 NOTE — Progress Notes (Signed)
[facility] [address], [place] [address]    Clinic Day:  10/08/22  Referring physician: [name], DO  Patient Care Team: [name], DO as PCP - General (Family Medicine) [name], MD as PCP - Cardiology (Cardiology) [name], MD (Cardiology) [name], MD as Consulting Physician (Gastroenterology) [name], RN as Oncology Nurse Navigator (Medical Oncology) [name], MD as Medical Oncologist (Medical Oncology) [name], MD as Consulting Physician (Hematology)   ASSESSMENT & PLAN:   Assessment: 1. 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  Occasional additional small pulmonary nodules measuring 0.4 cm and smaller. - Bronchoscopy (06/11/2021): RUL nodule brushing and FNA: Malignant cells with features of adenocarcinoma. - PET scan (06/22/2021): Subsolid nodular lesion in the right upper lobe, hypermetabolic SUV 15.  Small right hilar and infrahilar lymph nodes with SUV 4.03 concerning for metastatic adenopathy.  Small hypermetabolic left parotid gland lesion, consistent with previous history of Wharton's tumor.  Hypermetabolic focus in the transverse colon SUV 8.69. - MRI of the brain (07/11/2021): No evidence of metastatic disease.  Right sphenoid wing meningioma stable since 2022. - He was evaluated by Dr. Dorris Fetch and was recommended to undergo neoadjuvant chemoimmunotherapy followed by surgical resection. - 3 cycles of carboplatin, pemetrexed from 08/06/2021 through 10/02/2021 (opdivo given with only cycle 2, discontinued for cycle 3 due to cardiac problems) - NGS testing not performed due to insufficient sample. - Guardant360: Negative for EGFR and ALK.  MSI high not detected. - Right upper lobectomy and lymph node dissection (11/12/2021): 1.2 cm invasive adenocarcinoma,  moderately differentiated, margins negative, pT1b pN0, 0/17 lymph nodes positive from stations 4R, 7, 8, 9, 10R, 11 R and 12 R.  Negative for visceral pleural involvement, LVI.  PD-L1 TPS is 1%. - Adjuvant pembrolizumab started on 03/11/2022.  Pembrolizumab held after 3 cycles due to fall and right fracture, s/p right hemiarthroplasty.   2. Social/family history: - Lives at home with his wife.  Uses cane occasionally after he had stroke in January 2022.  He has retired after working in [employer].  He is current active smoker, 1 pack/day for 60 years.  No exposure to chemicals. - Brother died of metastatic cancer.  Maternal uncle had lung cancer.    Plan: 1. 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 - He is tolerating pembrolizumab reasonably well. - Labs today: Normal LFTs.  Creatinine normal.  CBC grossly normal with mild thrombocytopenia stable.  TSH is 1.209. - Physical activity is improving slowly. - Recommend continuing pembrolizumab every 3 weeks.  RTC 9 weeks for follow-up.  Will repeat CT chest with contrast prior to next visit.  2.  Normocytic anemia: - Last Feraheme on 06/13/2022.  Ferritin was 372 and percent saturation 37.  Hemoglobin is 10.8.  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   3.  Hypertension/A-fib: - Continue metoprolol 25 mg daily.  Blood pressure and heart rate are well-controlled.   4.  Depression: - Continue Lexapro 10 mg daily.    Orders Placed This Encounter  Procedures   CT Chest W Contrast    Standing Status:   Future    Standing Expiration Date:   10/08/2023    Order Specific Question:   If indicated for the ordered procedure,  I authorize the administration of contrast media per Radiology protocol    Answer:   Yes    Order Specific Question:   Does the patient have a contrast media/X-ray dye allergy?    Answer:   No    Order Specific Question:   Preferred imaging  location?    Answer:   [facility]   Magnesium    Standing Status:   Future    Standing Expiration Date:   12/31/2023   CBC with Differential    Standing Status:   Future    Standing Expiration Date:   12/31/2023   Comprehensive metabolic panel    Standing Status:   Future    Standing Expiration Date:   12/31/2023   T4    Standing Status:   Future    Standing Expiration Date:   12/31/2023   TSH    Standing Status:   Future    Standing Expiration Date:   12/31/2023   Magnesium    Standing Status:   Future    Standing Expiration Date:   01/21/2024   CBC with Differential    Standing Status:   Future    Standing Expiration Date:   01/21/2024   Comprehensive metabolic panel    Standing Status:   Future    Standing Expiration Date:   01/21/2024   Magnesium    Standing Status:   Future    Standing Expiration Date:   02/11/2024   CBC with Differential    Standing Status:   Future    Standing Expiration Date:   02/11/2024   Comprehensive metabolic panel    Standing Status:   Future    Standing Expiration Date:   02/11/2024   Magnesium    Standing Status:   Future    Standing Expiration Date:   03/03/2024   CBC with Differential    Standing Status:   Future    Standing Expiration Date:   03/03/2024   Comprehensive metabolic panel    Standing Status:   Future    Standing Expiration Date:   03/03/2024   T4    Standing Status:   Future    Standing Expiration Date:   03/03/2024   TSH    Standing Status:   Future    Standing Expiration Date:   03/03/2024   Magnesium    Standing Status:   Future    Standing Expiration Date:   03/24/2024   CBC with Differential    Standing Status:   Future    Standing Expiration Date:   03/24/2024   Comprehensive metabolic panel    Standing Status:   Future    Standing Expiration Date:   03/24/2024      Mikeal Hawthorne R Teague,acting as a scribe for [name], MD.,have documented all relevant documentation on the behalf of [name], MD,as directed by  [name], MD while in the presence of [name], MD.  I, [name] MD, have reviewed the above documentation for accuracy and completeness, and I agree with the above.      [name], MD   9/17/20244:21 PM  CHIEF COMPLAINT:   Diagnosis: right upper lobe lung adenocarcinoma    Cancer Staging  Primary adenocarcinoma of upper lobe of right lung [facility]) Staging form: Lung, AJCC 8th Edition - Clinical stage from 06/26/2021: cT1, cN1, cM0 - Unsigned    Prior Therapy: 1. 3 cycles Neoadjuvant chemoimmunotherapy with carboplatin, pemetrexed from 08/06/21 through 10/02/21 2. RUL lobectomy and LND on  10/23/203  Current Therapy:  Adjuvant pembrolizumab    HISTORY OF PRESENT ILLNESS:   Oncology History  Primary adenocarcinoma of upper lobe of right lung (HCC)  06/26/2021 Initial Diagnosis   Primary adenocarcinoma of upper lobe of right lung (HCC)   08/06/2021 - 08/27/2021 Chemotherapy   Patient is on Treatment Plan : LUNG NSCLC Pemetrexed + Carboplatin q21d x 4 Cycles     08/06/2021 - 10/02/2021 Chemotherapy   Patient is on Treatment Plan : LUNG NSCLC Pemetrexed + Carboplatin q21d x 4 Cycles     03/11/2022 -  Chemotherapy   Patient is on Treatment Plan : LUNG NSCLC Pembrolizumab (200) q21d        INTERVAL HISTORY:   Darryl Ward is a 77 y.o. male presenting to clinic today for follow up of right upper lobe lung adenocarcinoma. He was last seen by me on 08/21/22.  Today, he states that he is doing well overall. His appetite level is at 100%. His energy level is at 60%. Patient is accompanied by his wife.   He notes a normal appetite. He reports both ankles are swollen. His wife denies he has had any recent falls. He denies any side effects from treatment, including dry cough, skin rashes, SOB, nausea, or vomiting. His wife notes his strength is improving. He has finished physical therapy and is doing exercises at home to improve  strength. Patient has been released from Dr. Sunday Corn care.   PAST MEDICAL HISTORY:   Past Medical History: Past Medical History:  Diagnosis Date   AAA (abdominal aortic aneurysm) (HCC)    Arthritis    Cancer (HCC)    Carotid artery disease (HCC)    Nonobstructive   Cataract    COPD (chronic obstructive pulmonary disease) (HCC)    Coronary atherosclerosis of native coronary artery    PTCA small diagonal 2007 otherwise nonobstructive CAD   Depression    Dysrhythmia    Essential hypertension, benign    Hyperlipidemia    NSTEMI (non-ST elevated myocardial infarction) (HCC) 2007   Stroke [facility]) 2004   TIA (transient ischemic attack) 2006    Surgical History: Past Surgical History:  Procedure Laterality Date   AORTA - BILATERAL FEMORAL ARTERY BYPASS GRAFT  01/08/2012   Procedure: AORTA BIFEMORAL BYPASS GRAFT;  Surgeon: Sherren Kerns, MD;  Location: [facility] OR;  Service: Vascular;  Laterality: Bilateral;  using 18x96mm x 40cm Hemashield Gold Vascular Graft with Endarterectomy, Thombectomy and  Reimplantation of Inferior Mesenteric Artery   BACK SURGERY  2021   BRONCHIAL BIOPSY  06/11/2021   Procedure: BRONCHIAL BIOPSIES;  Surgeon: Leslye Peer, MD;  Location: [facility] ENDOSCOPY;  Service: Pulmonary;;   BRONCHIAL BRUSHINGS  06/11/2021   Procedure: BRONCHIAL BRUSHINGS;  Surgeon: Leslye Peer, MD;  Location: [facility] ENDOSCOPY;  Service: Pulmonary;;   BRONCHIAL NEEDLE ASPIRATION BIOPSY  06/11/2021   Procedure: BRONCHIAL NEEDLE ASPIRATION BIOPSIES;  Surgeon: Leslye Peer, MD;  Location: [facility] ENDOSCOPY;  Service: Pulmonary;;   CARDIOVERSION N/A 09/20/2021   Procedure: CARDIOVERSION;  Surgeon: [name], MD;  Location: [facility] ORS;  Service: Cardiovascular;  Laterality: N/A;   COLONOSCOPY N/A 04/14/2019   Procedure: COLONOSCOPY;  Surgeon: Corbin Ade, MD;  Location: [facility] ENDO SUITE;  Service: Endoscopy;  Laterality: N/A;  9:30   COLONOSCOPY WITH PROPOFOL N/A 07/25/2021   Procedure:  COLONOSCOPY WITH PROPOFOL;  Surgeon: Lanelle Bal, DO;  Location: [facility] ENDO SUITE;  Service: Endoscopy;  Laterality: N/A;  1:00pm   FIDUCIAL MARKER PLACEMENT  06/11/2021  Procedure: FIDUCIAL MARKER PLACEMENT;  Surgeon: Leslye Peer, MD;  Location: [facility] ENDOSCOPY;  Service: Pulmonary;;   HIP ARTHROPLASTY Right 04/28/2022   Procedure: ARTHROPLASTY BIPOLAR HIP (HEMIARTHROPLASTY);  Surgeon: Sheral Apley, MD;  Location: [facility] OR;  Service: Orthopedics;  Laterality: Right;   INTERCOSTAL NERVE BLOCK Right 11/12/2021   Procedure: INTERCOSTAL NERVE BLOCK;  Surgeon: Loreli Slot, MD;  Location: [facility] OR;  Service: Thoracic;  Laterality: Right;   IR IMAGING GUIDED PORT INSERTION  07/31/2021   Left cataract surgery     LYMPH NODE DISSECTION Right 11/12/2021   Procedure: LYMPH NODE DISSECTION;  Surgeon: Loreli Slot, MD;  Location: [facility] OR;  Service: Thoracic;  Laterality: Right;   POLYPECTOMY  04/14/2019   Procedure: POLYPECTOMY;  Surgeon: Corbin Ade, MD;  Location: [facility] ENDO SUITE;  Service: Endoscopy;;   POLYPECTOMY  07/25/2021   Procedure: POLYPECTOMY;  Surgeon: Lanelle Bal, DO;  Location: [facility] ENDO SUITE;  Service: Endoscopy;;   TEE WITHOUT CARDIOVERSION N/A 09/20/2021   Procedure: TRANSESOPHAGEAL ECHOCARDIOGRAM (TEE);  Surgeon: [name], MD;  Location: [facility] ORS;  Service: Cardiovascular;  Laterality: N/A;   TRANSFORAMINAL LUMBAR INTERBODY FUSION (TLIF) WITH PEDICLE SCREW FIXATION 1 LEVEL N/A 04/27/2020   Procedure: Transforaminal Lumbar Interbody Fusion Lumbar Five-Sacral One;  Surgeon: Bedelia Person, MD;  Location: [facility] OR;  Service: Neurosurgery;  Laterality: N/A;   VIDEO BRONCHOSCOPY WITH INSERTION OF INTERBRONCHIAL VALVE (IBV) N/A 11/22/2021   Procedure: VIDEO BRONCHOSCOPY WITH INSERTION OF INTERBRONCHIAL VALVE (IBV);  Surgeon: Loreli Slot, MD;  Location: [facility] OR;  Service: Thoracic;  Laterality: N/A;   VIDEO BRONCHOSCOPY WITH INSERTION OF INTERBRONCHIAL  VALVE (IBV) N/A 01/24/2022   Procedure: VIDEO BRONCHOSCOPY WITH REMOVAL OF INTERBRONCHIAL VALVE (IBV);  Surgeon: Loreli Slot, MD;  Location: [facility] OR;  Service: Thoracic;  Laterality: N/A;   VIDEO BRONCHOSCOPY WITH RADIAL ENDOBRONCHIAL ULTRASOUND  06/11/2021   Procedure: VIDEO BRONCHOSCOPY WITH RADIAL ENDOBRONCHIAL ULTRASOUND;  Surgeon: Leslye Peer, MD;  Location: [facility] ENDOSCOPY;  Service: Pulmonary;;    Social History: Social History   Socioeconomic History   Marital status: Divorced    Spouse name: Not on file   Number of children: 1   Years of education: 11   Highest education level: 11th grade  Occupational History    Employer: Engineer, materials  Tobacco Use   Smoking status: Former    Current packs/day: 0.00    Average packs/day: 1 pack/day for 40.0 years (40.0 ttl pk-yrs)    Types: Cigarettes    Start date: 07/1981    Quit date: 07/2021    Years since quitting: 1.2   Smokeless tobacco: Never   Tobacco comments:    1 pack of cigarettes smoked daily. 07/17/21 ARJ, RN   Vaping Use   Vaping status: Never Used  Substance and Sexual Activity   Alcohol use: No    Comment: Prior history of regular alcohol use   Drug use: No   Sexual activity: Not Currently  Other Topics Concern   Not on file  Social History Narrative   Not on file   Social Determinants of Health   Financial Resource Strain: High Risk (08/30/2022)   Overall Financial Resource Strain (CARDIA)    Difficulty of Paying Living Expenses: Hard  Food Insecurity: Food Insecurity Present (09/13/2022)   Hunger Vital Sign    Worried About Running Out of Food in the Last Year: Never true    Ran Out of Food in the Last Year: Sometimes true  Transportation Needs: No Transportation Needs (09/13/2022)   PRAPARE - Administrator, Civil Service (Medical): No    Lack of Transportation (Non-Medical): No  Physical Activity: Insufficiently Active (08/30/2022)   Exercise Vital Sign    Days of Exercise per Week:  7 days    Minutes of Exercise per Session: 20 min  Stress: No Stress Concern Present (08/30/2022)   [employer] of Occupational Health - Occupational Stress Questionnaire    Feeling of Stress : Not at all  Social Connections: Socially Isolated (08/30/2022)   Social Connection and Isolation Panel [NHANES]    Frequency of Communication with Friends and Family: Twice a week    Frequency of Social Gatherings with Friends and Family: Twice a week    Attends Religious Services: Never    Database administrator or Organizations: No    Attends Banker Meetings: Never    Marital Status: Divorced  Catering manager Violence: Not At Risk (08/30/2022)   Humiliation, Afraid, Rape, and Kick questionnaire    Fear of Current or Ex-Partner: No    Emotionally Abused: No    Physically Abused: No    Sexually Abused: No    Family History: Family History  Problem Relation Age of Onset   Hyperlipidemia Sister    Heart attack Brother 53   Cancer - Colon Neg Hx     Current Medications:  Current Outpatient Medications:    acetaminophen (TYLENOL) 500 MG tablet, GIVE 2 TABLETS (1,000 mg TOTAL) BY MOUTH EVERY 6 HOURS AS NEEDED FOR PAIN, Disp: 60 tablet, Rfl: 0   albuterol (VENTOLIN HFA) 108 (90 Base) MCG/ACT inhaler, Inhale 2 puffs into the lungs every 6 (six) hours as needed for wheezing or shortness of breath., Disp: 6.7 g, Rfl: 6   apixaban (ELIQUIS) 5 MG TABS tablet, Take 1 tablet (5 mg total) by mouth 2 (two) times daily., Disp: 180 tablet, Rfl: 3   atorvastatin (LIPITOR) 80 MG tablet, Take 1 tablet (80 mg total) by mouth daily., Disp: 90 tablet, Rfl: 3   cyclobenzaprine (FLEXERIL) 10 MG tablet, Take 1 tablet (10 mg total) by mouth 2 (two) times daily as needed., Disp: 60 tablet, Rfl: 2   escitalopram (LEXAPRO) 10 MG tablet, Take 1 tablet (10 mg total) by mouth daily., Disp: 90 tablet, Rfl: 3   folic acid (KP FOLIC ACID) 1 MG tablet, Take 1 tablet (1 mg total) by mouth daily., Disp: 90  tablet, Rfl: 3   furosemide (LASIX) 20 MG tablet, Take 1 tablet (20 mg total) by mouth daily as needed., Disp: 90 tablet, Rfl: 2   HYDROcodone-acetaminophen (NORCO/VICODIN) 5-325 MG tablet, Take 1 tablet by mouth every 6 (six) hours as needed, for pain., Disp: 60 tablet, Rfl: 0   magnesium oxide (MAG-OX) 400 (240 Mg) MG tablet, Take 1 tablet (400 mg total) by mouth daily., Disp: 120 tablet, Rfl: 3   metoprolol succinate (TOPROL-XL) 25 MG 24 hr tablet, Take 3 tablets (75 mg total) by mouth daily. Take with or immediately following a meal. Do Not Crush. (Patient taking differently: Take 50 mg by mouth daily.), Disp: 270 tablet, Rfl: 3   pantoprazole (PROTONIX) 40 MG tablet, Take 1 tablet (40 mg total) by mouth daily., Disp: 90 tablet, Rfl: 3   tamsulosin (FLOMAX) 0.4 MG CAPS capsule, Take 1 capsule (0.4 mg total) by mouth daily., Disp: 90 capsule, Rfl: 3   Tiotropium Bromide Monohydrate (SPIRIVA RESPIMAT) 2.5 MCG/ACT AERS, Inhale 2 puffs into the lungs daily., Disp: 4  g, Rfl: 3 No current facility-administered medications for this visit.  Facility-Administered Medications Ordered in Other Visits:    sodium chloride flush (NS) 0.9 % injection 10 mL, 10 mL, Intracatheter, PRN, [name], MD, 10 mL at 07/10/22 1513   sodium chloride flush (NS) 0.9 % injection 10 mL, 10 mL, Intracatheter, PRN, [name], MD, 10 mL at 10/08/22 1459   Allergies: No Known Allergies  REVIEW OF SYSTEMS:   Review of Systems  Constitutional:  Negative for chills, fatigue and fever.  HENT:   Negative for lump/mass, mouth sores, nosebleeds, sore throat and trouble swallowing.   Eyes:  Negative for eye problems.  Respiratory:  Negative for cough and shortness of breath.   Cardiovascular:  Negative for chest pain, leg swelling and palpitations.  Gastrointestinal:  Negative for abdominal pain, constipation, diarrhea, nausea and vomiting.  Genitourinary:  Negative for bladder incontinence, difficulty  urinating, dysuria, frequency, hematuria and nocturia.   Musculoskeletal:  Negative for arthralgias, back pain, flank pain, myalgias and neck pain.  Skin:  Negative for itching and rash.  Neurological:  Negative for dizziness, headaches and numbness.  Hematological:  Does not bruise/bleed easily.  Psychiatric/Behavioral:  Negative for depression, sleep disturbance and suicidal ideas. The patient is not nervous/anxious.   All other systems reviewed and are negative.    VITALS:   There were no vitals taken for this visit.  Wt Readings from Last 3 Encounters:  10/08/22 169 lb 3.2 oz (76.7 kg)  09/25/22 170 lb 12.8 oz (77.5 kg)  09/04/22 159 lb 11.2 oz (72.4 kg)    There is no height or weight on file to calculate BMI.  Performance status (ECOG): 1 - Symptomatic but completely ambulatory  PHYSICAL EXAM:   Physical Exam Vitals and nursing note reviewed. Exam conducted with a chaperone present.  Constitutional:      Appearance: Normal appearance.  Cardiovascular:     Rate and Rhythm: Normal rate and regular rhythm.     Pulses: Normal pulses.     Heart sounds: Normal heart sounds.  Pulmonary:     Effort: Pulmonary effort is normal.     Breath sounds: Normal breath sounds.  Abdominal:     Palpations: Abdomen is soft. There is no hepatomegaly, splenomegaly or mass.     Tenderness: There is no abdominal tenderness.  Musculoskeletal:     Right lower leg: No edema.     Left lower leg: No edema.  Lymphadenopathy:     Cervical: No cervical adenopathy.     Right cervical: No superficial, deep or posterior cervical adenopathy.    Left cervical: No superficial, deep or posterior cervical adenopathy.     Upper Body:     Right upper body: No supraclavicular or axillary adenopathy.     Left upper body: No supraclavicular or axillary adenopathy.  Neurological:     General: No focal deficit present.     Mental Status: He is alert and oriented to person, place, and time.  Psychiatric:         Mood and Affect: Mood normal.        Behavior: Behavior normal.     LABS:      Latest Ref Rng & Units 10/08/2022   11:54 AM 09/11/2022   12:10 PM 08/21/2022   11:51 AM  CBC  WBC 4.0 - 10.5 K/uL 6.5  6.8  5.6   Hemoglobin 13.0 - 17.0 g/dL 69.6  29.5  28.4   Hematocrit 39.0 - 52.0 % 32.7  33.8  31.7   Platelets 150 - 400 K/uL 137  136  126       Latest Ref Rng & Units 10/08/2022   11:54 AM 09/11/2022   12:10 PM 08/21/2022   11:51 AM  CMP  Glucose 70 - 99 mg/dL 94  82  161   BUN 8 - 23 mg/dL 16  17  12    Creatinine 0.61 - 1.24 mg/dL 0.96  0.45  4.09   Sodium 135 - 145 mmol/L 137  137  136   Potassium 3.5 - 5.1 mmol/L 4.3  3.7  3.8   Chloride 98 - 111 mmol/L 107  104  104   CO2 22 - 32 mmol/L 23  24  23    Calcium 8.9 - 10.3 mg/dL 8.7  8.9  8.9   Total Protein 6.5 - 8.1 g/dL 6.5  6.4  6.1   Total Bilirubin 0.3 - 1.2 mg/dL 0.5  0.8  0.6   Alkaline Phos 38 - 126 U/L 81  60  62   AST 15 - 41 U/L 18  24  21    ALT 0 - 44 U/L 13  17  16       No results found for: "CEA1", "CEA" / No results found for: "CEA1", "CEA" Lab Results  Component Value Date   PSA1 1.2 11/25/2019   No results found for: "WJX914" No results found for: "CAN125"  No results found for: "TOTALPROTELP", "ALBUMINELP", "A1GS", "A2GS", "BETS", "BETA2SER", "GAMS", "MSPIKE", "SPEI" Lab Results  Component Value Date   TIBC 233 (L) 08/21/2022   TIBC 253 06/13/2022   TIBC 291 04/01/2022   FERRITIN 372 (H) 08/21/2022   FERRITIN 501 (H) 06/13/2022   FERRITIN 86 04/01/2022   IRONPCTSAT 37 08/21/2022   IRONPCTSAT 24 06/13/2022   IRONPCTSAT 22 04/01/2022   No results found for: "LDH"   STUDIES:   No results found.

## 2022-10-08 NOTE — Progress Notes (Signed)
Patients port flushed without difficulty.  Good blood return noted with no bruising or swelling noted at site.  Patient remains accessed for treatment.  

## 2022-10-08 NOTE — Patient Instructions (Signed)

## 2022-10-08 NOTE — Patient Instructions (Signed)
[facility]  Discharge Instructions: Thank you for choosing [facility] to provide your oncology and hematology care.  If you have a lab appointment with the Cancer Center - please note that after April 8th, 2024, all labs will be drawn in the cancer center.  You do not have to check in or register with the main entrance as you have in the past but will complete your check-in in the cancer center.  Wear comfortable clothing and clothing appropriate for easy access to any Portacath or PICC line.   We strive to give you quality time with your provider. You may need to reschedule your appointment if you arrive late (15 or more minutes).  Arriving late affects you and other patients whose appointments are after yours.  Also, if you miss three or more appointments without notifying the office, you may be dismissed from the clinic at the provider's discretion.      For prescription refill requests, have your pharmacy contact our office and allow 72 hours for refills to be completed.    Today you received the following chemotherapy and/or immunotherapy agents keytruda.        To help prevent nausea and vomiting after your treatment, we encourage you to take your nausea medication as directed.  BELOW ARE SYMPTOMS THAT SHOULD BE REPORTED IMMEDIATELY: *FEVER GREATER THAN 100.4 F (38 C) OR HIGHER *CHILLS OR SWEATING *NAUSEA AND VOMITING THAT IS NOT CONTROLLED WITH YOUR NAUSEA MEDICATION *UNUSUAL SHORTNESS OF BREATH *UNUSUAL BRUISING OR BLEEDING *URINARY PROBLEMS (pain or burning when urinating, or frequent urination) *BOWEL PROBLEMS (unusual diarrhea, constipation, pain near the anus) TENDERNESS IN MOUTH AND THROAT WITH OR WITHOUT PRESENCE OF ULCERS (sore throat, sores in mouth, or a toothache) UNUSUAL RASH, SWELLING OR PAIN  UNUSUAL VAGINAL DISCHARGE OR ITCHING   Items with * indicate a potential emergency and should be followed up as soon as possible or go to  the Emergency Department if any problems should occur.  Please show the CHEMOTHERAPY ALERT CARD or IMMUNOTHERAPY ALERT CARD at check-in to the Emergency Department and triage nurse.  Should you have questions after your visit or need to cancel or reschedule your appointment, please contact [facility]ENTER AT [facility] [phone]  and follow the prompts.  Office hours are 8:00 a.m. to 4:30 p.m. Monday - Friday. Please note that voicemails left after 4:00 p.m. may not be returned until the following business day.  We are closed weekends and major holidays. You have access to a nurse at all times for urgent questions. Please call the main number to the clinic [phone] and follow the prompts.  For any non-urgent questions, you may also contact your provider using MyChart. We now offer e-Visits for anyone 31 and older to request care online for non-urgent symptoms. For details visit [email or web address].   Also download the MyChart app! Go to the app store, search "MyChart", open the app, select , and log in with your MyChart username and password.

## 2022-10-14 ENCOUNTER — Other Ambulatory Visit: Payer: Self-pay

## 2022-10-14 ENCOUNTER — Encounter: Payer: Self-pay | Admitting: Hematology

## 2022-10-21 ENCOUNTER — Other Ambulatory Visit: Payer: Self-pay

## 2022-10-29 ENCOUNTER — Encounter: Payer: Self-pay | Admitting: Cardiology

## 2022-10-29 ENCOUNTER — Ambulatory Visit: Payer: HMO | Attending: Cardiology | Admitting: Cardiology

## 2022-10-29 VITALS — BP 116/70 | HR 63 | Ht 70.0 in | Wt 180.2 lb

## 2022-10-29 DIAGNOSIS — I480 Paroxysmal atrial fibrillation: Secondary | ICD-10-CM | POA: Diagnosis not present

## 2022-10-29 DIAGNOSIS — I5032 Chronic diastolic (congestive) heart failure: Secondary | ICD-10-CM | POA: Diagnosis not present

## 2022-10-29 DIAGNOSIS — E785 Hyperlipidemia, unspecified: Secondary | ICD-10-CM

## 2022-10-29 DIAGNOSIS — I25119 Atherosclerotic heart disease of native coronary artery with unspecified angina pectoris: Secondary | ICD-10-CM

## 2022-10-29 NOTE — Patient Instructions (Addendum)
Medication Instructions:   Your physician recommends that you continue on your current medications as directed. Please refer to the Current Medication list given to you today.   Labwork: Fasting Lipids at [facility]   Testing/Procedures: None today  Follow-Up: 6 months with Dr.McDowell or Physician Assistant   Any Other Special Instructions Will Be Listed Below (If Applicable).  If you need a refill on your cardiac medications before your next appointment, please call your pharmacy.

## 2022-10-29 NOTE — Progress Notes (Signed)
Cardiology Office Note  Date: 10/29/2022   ID: JAMESRYAN KRAMMER, DOB 13-Sep-1945, MRN [id number]  History of Present Illness: Darryl Ward is a 77 y.o. male last seen in July by Mr. Cleaver NP, I reviewed the note.  I last saw him in 2014, he has had more recent follow-up with Dr. Antoine Poche.  I reviewed extensive records.  He is here today for a follow-up visit with significant other.  From a cardiac perspective, he does not report any palpitations, has had no angina, no dizziness or syncope.  I reviewed his medications.  Current cardiovascular regimen includes Eliquis, Lipitor, Toprol-XL, and Lasix as needed.  Beta-blocker dose has been cut back, currently tolerating Toprol-XL 25 mg daily.  No reported spontaneous bleeding problems.  I reviewed his lab work from September and also his echocardiogram result from earlier in the year.  He has not had a recent fasting lipid profile.  Physical Exam: VS:  BP 116/70 (BP Location: Right Arm, Patient Position: Sitting, Cuff Size: Normal)   Pulse 63   Ht 5\' 10"  (1.778 m)   Wt 180 lb 3.2 oz (81.7 kg)   SpO2 98%   BMI 25.86 kg/m , BMI Body mass index is 25.86 kg/m.  Wt Readings from Last 3 Encounters:  10/29/22 180 lb 3.2 oz (81.7 kg)  10/08/22 169 lb 3.2 oz (76.7 kg)  09/25/22 170 lb 12.8 oz (77.5 kg)    General: Patient appears comfortable at rest. HEENT: Conjunctiva and lids normal. Neck: Supple, no elevated JVP or carotid bruits. Lungs: Clear to auscultation, nonlabored breathing at rest. Cardiac: Regular rate and rhythm, no S3, 1/6 systolic murmur. Extremities: Mild ankle edema.  ECG:  An ECG dated 04/27/2022 was personally reviewed today and demonstrated:  Sinus bradycardia.  Labwork: 12/04/2021: B Natriuretic Peptide 62.8 08/21/2022: TSH 1.209 10/08/2022: ALT 13; AST 18; BUN 16; Creatinine, Ser 1.19; Hemoglobin 10.8; Magnesium 1.9; Platelets 137; Potassium 4.3; Sodium 137     Component Value Date/Time   CHOL 202 (H) 02/05/2020  0755   CHOL 198 11/25/2019 1123   TRIG 99 02/05/2020 0755   HDL 29 (L) 02/05/2020 0755   HDL 36 (L) 11/25/2019 1123   CHOLHDL 7.0 02/05/2020 0755   VLDL 20 02/05/2020 0755   LDLCALC 153 (H) 02/05/2020 0755   LDLCALC 139 (H) 11/25/2019 1123   Other Studies Reviewed Today:  Echocardiogram 03/04/2022:  1. Left ventricular ejection fraction, by estimation, is approximately  55%. The left ventricle has normal function. The left ventricle  demonstrates regional wall motion abnormalities (see scoring  diagram/findings for description). Left ventricular  diastolic parameters are consistent with Grade II diastolic dysfunction  (pseudonormalization).   2. Right ventricular systolic function is normal. The right ventricular  size is normal. There is normal pulmonary artery systolic pressure. The  estimated right ventricular systolic pressure is 35.0 mmHg.   3. Left atrial size was mildly dilated.   4. The mitral valve is grossly normal. Mild mitral valve regurgitation.   5. The aortic valve is tricuspid. Aortic valve regurgitation is not  visualized.   6. Aortic dilatation noted. 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   7. The inferior vena cava is dilated in size with >50% respiratory  variability, suggesting right atrial pressure of 8 mmHg.   Assessment and Plan:  1.  CAD status post angioplasty of a small diagonal branch in 2007 with otherwise nonobstructive disease managed medically.  No angina at current level of  activity.  Continue Lipitor.  2.  HFrecEF with ischemic cardiomyopathy, LVEF approximately 55% by echocardiogram in February.  Currently on Toprol XL and Lasix.  3.  Primary hypertension.  Blood pressure well-controlled today.  No changes were made.  4.  Paroxysmal atrial fibrillation with CHA2DS2-VASc score of 6.  Underwent TEE guided cardioversion in August 2023 due to persistent arrhythmia at that time.  No palpitations and heart rate is  regular today.  Continue Toprol-XL at current dose along with Eliquis for stroke prophylaxis.  5.  Mixed hyperlipidemia.  Check FLP.  He is currently on Lipitor 80 mg daily.  6.  PAD status post aortobifemoral bypass in 2013.  7.  Carotid artery disease, 50 to 69% LICA stenosis and nonobstructive RICA stenosis by carotid Dopplers in January 2022.  Continue Lipitor.  8.  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  Continuing on immunotherapy.  Disposition:  Follow up  6 months.  Signed, [name], M.D., F.A.C.C. [facility] at [facility]

## 2022-10-30 ENCOUNTER — Inpatient Hospital Stay: Payer: HMO

## 2022-10-30 ENCOUNTER — Inpatient Hospital Stay: Payer: HMO | Attending: Hematology

## 2022-10-30 VITALS — BP 96/53 | HR 62 | Temp 98.4°F | Resp 18

## 2022-10-30 DIAGNOSIS — Z5112 Encounter for antineoplastic immunotherapy: Secondary | ICD-10-CM | POA: Diagnosis present

## 2022-10-30 DIAGNOSIS — Z79620 Long term (current) use of immunosuppressive biologic: Secondary | ICD-10-CM | POA: Insufficient documentation

## 2022-10-30 DIAGNOSIS — C3411 Malignant neoplasm of upper lobe, right bronchus or lung: Secondary | ICD-10-CM | POA: Diagnosis not present

## 2022-10-30 LAB — COMPREHENSIVE METABOLIC PANEL
ALT: 15 U/L (ref 0–44)
AST: 18 U/L (ref 15–41)
Albumin: 3.1 g/dL — ABNORMAL LOW (ref 3.5–5.0)
Alkaline Phosphatase: 54 U/L (ref 38–126)
Anion gap: 5 (ref 5–15)
BUN: 18 mg/dL (ref 8–23)
CO2: 22 mmol/L (ref 22–32)
Calcium: 8.4 mg/dL — ABNORMAL LOW (ref 8.9–10.3)
Chloride: 107 mmol/L (ref 98–111)
Creatinine, Ser: 1.07 mg/dL (ref 0.61–1.24)
GFR, Estimated: >60 mL/min (ref >60)
Glucose, Bld: 141 mg/dL — ABNORMAL HIGH (ref 70–99)
Potassium: 4.1 mmol/L (ref 3.5–5.1)
Sodium: 134 mmol/L — ABNORMAL LOW (ref 135–145)
Total Bilirubin: 0.9 mg/dL (ref 0.3–1.2)
Total Protein: 6 g/dL — ABNORMAL LOW (ref 6.5–8.1)

## 2022-10-30 LAB — CBC WITH DIFFERENTIAL/PLATELET
Abs Immature Granulocytes: 0.02 10*3/uL (ref 0.00–0.07)
Basophils Absolute: 0 10*3/uL (ref 0.0–0.1)
Basophils Relative: 1 %
Eosinophils Absolute: 0.1 10*3/uL (ref 0.0–0.5)
Eosinophils Relative: 2 %
HCT: 30.9 % — ABNORMAL LOW (ref 39.0–52.0)
Hemoglobin: 10.2 g/dL — ABNORMAL LOW (ref 13.0–17.0)
Immature Granulocytes: 0 %
Lymphocytes Relative: 8 %
Lymphs Abs: 0.5 10*3/uL — ABNORMAL LOW (ref 0.7–4.0)
MCH: 32.5 pg (ref 26.0–34.0)
MCHC: 33 g/dL (ref 30.0–36.0)
MCV: 98.4 fL (ref 80.0–100.0)
Monocytes Absolute: 0.2 10*3/uL (ref 0.1–1.0)
Monocytes Relative: 3 %
Neutro Abs: 5 10*3/uL (ref 1.7–7.7)
Neutrophils Relative %: 86 %
Platelets: 117 10*3/uL — ABNORMAL LOW (ref 150–400)
RBC: 3.14 MIL/uL — ABNORMAL LOW (ref 4.22–5.81)
RDW: 13.8 % (ref 11.5–15.5)
WBC: 5.9 10*3/uL (ref 4.0–10.5)
nRBC: 0 % (ref 0.0–0.2)

## 2022-10-30 LAB — MAGNESIUM: Magnesium: 1.7 mg/dL (ref 1.7–2.4)

## 2022-10-30 LAB — TSH: TSH: 1.419 u[IU]/mL (ref 0.350–4.500)

## 2022-10-30 MED ORDER — SODIUM CHLORIDE 0.9 % IV SOLN: Freq: Once | INTRAVENOUS | Status: AC

## 2022-10-30 MED ORDER — HEPARIN SOD (PORK) LOCK FLUSH 100 UNIT/ML IV SOLN
500.0000 [IU] | Freq: Once | INTRAVENOUS | Status: AC | PRN
  Administered 2022-10-30: 500 [IU]

## 2022-10-30 MED ORDER — SODIUM CHLORIDE 0.9% FLUSH
10.0000 mL | INTRAVENOUS | Status: AC
  Administered 2022-10-30: 10 mL

## 2022-10-30 MED ORDER — SODIUM CHLORIDE 0.9 % IV SOLN
200.0000 mg | Freq: Once | INTRAVENOUS | Status: AC
  Administered 2022-10-30: 200 mg via INTRAVENOUS
  Filled 2022-10-30: qty 8

## 2022-10-30 MED ORDER — SODIUM CHLORIDE 0.9% FLUSH
10.0000 mL | INTRAVENOUS | Status: DC | PRN
  Administered 2022-10-30: 10 mL

## 2022-10-30 NOTE — Progress Notes (Unsigned)
Patient presents today for chemotherapy infusion.  Patient is in satisfactory condition with no new complaints voiced.  Vital signs are stable.  Labs reviewed and all labs are within treatment parameters.  We will proceed with treatment per MD orders.    Patient tolerated treatment well with no complaints voiced.  Patient left via wheelchair with wife in stable condition.  Vital signs stable at discharge.  Follow up as scheduled.

## 2022-10-31 ENCOUNTER — Encounter: Payer: Self-pay | Admitting: Hematology

## 2022-10-31 LAB — T4: T4, Total: 8 ug/dL (ref 4.5–12.0)

## 2022-10-31 NOTE — Patient Instructions (Signed)
[facility]  Discharge Instructions: Thank you for choosing [facility] to provide your oncology and hematology care.  If you have a lab appointment with the Cancer Center - please note that after April 8th, 2024, all labs will be drawn in the cancer center.  You do not have to check in or register with the main entrance as you have in the past but will complete your check-in in the cancer center.  Wear comfortable clothing and clothing appropriate for easy access to any Portacath or PICC line.   We strive to give you quality time with your provider. You may need to reschedule your appointment if you arrive late (15 or more minutes).  Arriving late affects you and other patients whose appointments are after yours.  Also, if you miss three or more appointments without notifying the office, you may be dismissed from the clinic at the provider's discretion.      For prescription refill requests, have your pharmacy contact our office and allow 72 hours for refills to be completed.    Today you received the following chemotherapy and/or immunotherapy agents Keytruda. Pembrolizumab Injection What is this medication? PEMBROLIZUMAB (PEM broe LIZ ue mab) treats some types of cancer. It works by helping your immune system slow or stop the spread of cancer cells. It is a monoclonal antibody. This medicine may be used for other purposes; ask your health care provider or pharmacist if you have questions. COMMON BRAND NAME(S): Keytruda What should I tell my care team before I take this medication? They need to know if you have any of these conditions: Allogeneic stem cell transplant (uses someone else's stem cells) Autoimmune diseases, such as Crohn disease, ulcerative colitis, lupus History of chest radiation Nervous system problems, such as Guillain-Barre syndrome, myasthenia gravis Organ transplant An unusual or allergic reaction to pembrolizumab, other medications,  foods, dyes, or preservatives Pregnant or trying to get pregnant Breast-feeding How should I use this medication? This medication is injected into a vein. It is given by your care team in a hospital or clinic setting. A special MedGuide will be given to you before each treatment. Be sure to read this information carefully each time. Talk to your care team about the use of this medication in children. While it may be prescribed for children as young as 6 months for selected conditions, precautions do apply. Overdosage: If you think you have taken too much of this medicine contact a poison control center or emergency room at once. NOTE: This medicine is only for you. Do not share this medicine with others. What if I miss a dose? Keep appointments for follow-up doses. It is important not to miss your dose. Call your care team if you are unable to keep an appointment. What may interact with this medication? Interactions have not been studied. This list may not describe all possible interactions. Give your health care provider a list of all the medicines, herbs, non-prescription drugs, or dietary supplements you use. Also tell them if you smoke, drink alcohol, or use illegal drugs. Some items may interact with your medicine. What should I watch for while using this medication? Your condition will be monitored carefully while you are receiving this medication. You may need blood work while taking this medication. This medication may cause serious skin reactions. They can happen weeks to months after starting the medication. Contact your care team right away if you notice fevers or flu-like symptoms with a rash. The rash   may be red or purple and then turn into blisters or peeling of the skin. You may also notice a red rash with swelling of the face, lips, or lymph nodes in your neck or under your arms. Tell your care team right away if you have any change in your eyesight. Talk to your care team if you  may be pregnant. Serious birth defects can occur if you take this medication during pregnancy and for 4 months after the last dose. You will need a negative pregnancy test before starting this medication. Contraception is recommended while taking this medication and for 4 months after the last dose. Your care team can help you find the option that works for you. Do not breastfeed while taking this medication and for 4 months after the last dose. What side effects may I notice from receiving this medication? Side effects that you should report to your care team as soon as possible: Allergic reactions--skin rash, itching, hives, swelling of the face, lips, tongue, or throat Dry cough, shortness of breath or trouble breathing Eye pain, redness, irritation, or discharge with blurry or decreased vision Heart muscle inflammation--unusual weakness or fatigue, shortness of breath, chest pain, fast or irregular heartbeat, dizziness, swelling of the ankles, feet, or hands Hormone gland problems--headache, sensitivity to light, unusual weakness or fatigue, dizziness, fast or irregular heartbeat, increased sensitivity to cold or heat, excessive sweating, constipation, hair loss, increased thirst or amount of urine, tremors or shaking, irritability Infusion reactions--chest pain, shortness of breath or trouble breathing, feeling faint or lightheaded Kidney injury (glomerulonephritis)--decrease in the amount of urine, red or dark [place] urine, foamy or bubbly urine, swelling of the ankles, hands, or feet Liver injury--right upper belly pain, loss of appetite, nausea, light-colored stool, dark yellow or [place] urine, yellowing skin or eyes, unusual weakness or fatigue Pain, tingling, or numbness in the hands or feet, muscle weakness, change in vision, confusion or trouble speaking, loss of balance or coordination, trouble walking, seizures Rash, fever, and swollen lymph nodes Redness, blistering, peeling, or loosening  of the skin, including inside the mouth Sudden or severe stomach pain, bloody diarrhea, fever, nausea, vomiting Side effects that usually do not require medical attention (report to your care team if they continue or are bothersome): Bone, joint, or muscle pain Diarrhea Fatigue Loss of appetite Nausea Skin rash This list may not describe all possible side effects. Call your doctor for medical advice about side effects. You may report side effects to FDA at 1-800-FDA-1088. Where should I keep my medication? This medication is given in a hospital or clinic. It will not be stored at home. NOTE: This sheet is a summary. It may not cover all possible information. If you have questions about this medicine, talk to your doctor, pharmacist, or health care provider.  2024 Elsevier/Gold Standard (2021-05-22 00:00:00)       To help prevent nausea and vomiting after your treatment, we encourage you to take your nausea medication as directed.  BELOW ARE SYMPTOMS THAT SHOULD BE REPORTED IMMEDIATELY: *FEVER GREATER THAN 100.4 F (38 C) OR HIGHER *CHILLS OR SWEATING *NAUSEA AND VOMITING THAT IS NOT CONTROLLED WITH YOUR NAUSEA MEDICATION *UNUSUAL SHORTNESS OF BREATH *UNUSUAL BRUISING OR BLEEDING *URINARY PROBLEMS (pain or burning when urinating, or frequent urination) *BOWEL PROBLEMS (unusual diarrhea, constipation, pain near the anus) TENDERNESS IN MOUTH AND THROAT WITH OR WITHOUT PRESENCE OF ULCERS (sore throat, sores in mouth, or a toothache) UNUSUAL RASH, SWELLING OR PAIN  UNUSUAL VAGINAL DISCHARGE OR ITCHING     Items with * indicate a potential emergency and should be followed up as soon as possible or go to the Emergency Department if any problems should occur.  Please show the CHEMOTHERAPY ALERT CARD or IMMUNOTHERAPY ALERT CARD at check-in to the Emergency Department and triage nurse.  Should you have questions after your visit or need to cancel or reschedule your appointment, please contact  [facility] [place] [phone]  and follow the prompts.  Office hours are 8:00 a.m. to 4:30 p.m. Monday - Friday. Please note that voicemails left after 4:00 p.m. may not be returned until the following business day.  We are closed weekends and major holidays. You have access to a nurse at all times for urgent questions. Please call the main number to the clinic [phone] and follow the prompts.  For any non-urgent questions, you may also contact your provider using MyChart. We now offer e-Visits for anyone 18 and older to request care online for non-urgent symptoms. For details visit [email or web address].   Also download the MyChart app! Go to the app store, search "MyChart", open the app, select [facility], and log in with your MyChart username and password.   

## 2022-11-07 ENCOUNTER — Other Ambulatory Visit: Payer: Self-pay

## 2022-11-19 ENCOUNTER — Inpatient Hospital Stay: Payer: HMO

## 2022-11-19 VITALS — BP 120/69 | HR 55 | Temp 97.0°F | Resp 18

## 2022-11-19 DIAGNOSIS — Z5112 Encounter for antineoplastic immunotherapy: Secondary | ICD-10-CM | POA: Diagnosis not present

## 2022-11-19 DIAGNOSIS — C3411 Malignant neoplasm of upper lobe, right bronchus or lung: Secondary | ICD-10-CM

## 2022-11-19 LAB — COMPREHENSIVE METABOLIC PANEL
ALT: 14 U/L (ref 0–44)
AST: 19 U/L (ref 15–41)
Albumin: 3.3 g/dL — ABNORMAL LOW (ref 3.5–5.0)
Alkaline Phosphatase: 73 U/L (ref 38–126)
Anion gap: 8 (ref 5–15)
BUN: 24 mg/dL — ABNORMAL HIGH (ref 8–23)
CO2: 22 mmol/L (ref 22–32)
Calcium: 8.8 mg/dL — ABNORMAL LOW (ref 8.9–10.3)
Chloride: 106 mmol/L (ref 98–111)
Creatinine, Ser: 1.08 mg/dL (ref 0.61–1.24)
GFR, Estimated: >60 mL/min (ref >60)
Glucose, Bld: 110 mg/dL — ABNORMAL HIGH (ref 70–99)
Potassium: 4.3 mmol/L (ref 3.5–5.1)
Sodium: 136 mmol/L (ref 135–145)
Total Bilirubin: 0.7 mg/dL (ref 0.3–1.2)
Total Protein: 6.6 g/dL (ref 6.5–8.1)

## 2022-11-19 LAB — CBC WITH DIFFERENTIAL/PLATELET
Abs Immature Granulocytes: 0.02 10*3/uL (ref 0.00–0.07)
Basophils Absolute: 0 10*3/uL (ref 0.0–0.1)
Basophils Relative: 1 %
Eosinophils Absolute: 0.3 10*3/uL (ref 0.0–0.5)
Eosinophils Relative: 5 %
HCT: 33.5 % — ABNORMAL LOW (ref 39.0–52.0)
Hemoglobin: 10.9 g/dL — ABNORMAL LOW (ref 13.0–17.0)
Immature Granulocytes: 0 %
Lymphocytes Relative: 20 %
Lymphs Abs: 1.3 10*3/uL (ref 0.7–4.0)
MCH: 32.2 pg (ref 26.0–34.0)
MCHC: 32.5 g/dL (ref 30.0–36.0)
MCV: 99.1 fL (ref 80.0–100.0)
Monocytes Absolute: 0.5 10*3/uL (ref 0.1–1.0)
Monocytes Relative: 8 %
Neutro Abs: 4.3 10*3/uL (ref 1.7–7.7)
Neutrophils Relative %: 66 %
Platelets: 134 10*3/uL — ABNORMAL LOW (ref 150–400)
RBC: 3.38 MIL/uL — ABNORMAL LOW (ref 4.22–5.81)
RDW: 13.5 % (ref 11.5–15.5)
WBC: 6.5 10*3/uL (ref 4.0–10.5)
nRBC: 0 % (ref 0.0–0.2)

## 2022-11-19 LAB — MAGNESIUM: Magnesium: 1.9 mg/dL (ref 1.7–2.4)

## 2022-11-19 MED ORDER — SODIUM CHLORIDE 0.9 % IV SOLN
200.0000 mg | Freq: Once | INTRAVENOUS | Status: AC
  Administered 2022-11-19: 200 mg via INTRAVENOUS
  Filled 2022-11-19: qty 8

## 2022-11-19 MED ORDER — SODIUM CHLORIDE 0.9% FLUSH
10.0000 mL | INTRAVENOUS | Status: DC | PRN
  Administered 2022-11-19: 10 mL

## 2022-11-19 MED ORDER — HEPARIN SOD (PORK) LOCK FLUSH 100 UNIT/ML IV SOLN
500.0000 [IU] | Freq: Once | INTRAVENOUS | Status: AC | PRN
  Administered 2022-11-19: 500 [IU]

## 2022-11-19 MED ORDER — SODIUM CHLORIDE 0.9% FLUSH
10.0000 mL | INTRAVENOUS | Status: AC
  Administered 2022-11-19: 10 mL

## 2022-11-19 MED ORDER — SODIUM CHLORIDE 0.9 % IV SOLN: Freq: Once | INTRAVENOUS | Status: AC

## 2022-11-19 NOTE — Patient Instructions (Signed)
[facility] [place]  Discharge Instructions: Thank you for choosing [facility] to provide your oncology and hematology care.  If you have a lab appointment with the Cancer Center - please note that after April 8th, 2024, all labs will be drawn in the cancer center.  You do not have to check in or register with the main entrance as you have in the past but will complete your check-in in the cancer center.  Wear comfortable clothing and clothing appropriate for easy access to any Portacath or PICC line.   We strive to give you quality time with your provider. You may need to reschedule your appointment if you arrive late (15 or more minutes).  Arriving late affects you and other patients whose appointments are after yours.  Also, if you miss three or more appointments without notifying the office, you may be dismissed from the clinic at the provider's discretion.      For prescription refill requests, have your pharmacy contact our office and allow 72 hours for refills to be completed.    Today you received the following chemotherapy and/or immunotherapy agents Keytruda      To help prevent nausea and vomiting after your treatment, we encourage you to take your nausea medication as directed.  BELOW ARE SYMPTOMS THAT SHOULD BE REPORTED IMMEDIATELY: *FEVER GREATER THAN 100.4 F (38 C) OR HIGHER *CHILLS OR SWEATING *NAUSEA AND VOMITING THAT IS NOT CONTROLLED WITH YOUR NAUSEA MEDICATION *UNUSUAL SHORTNESS OF BREATH *UNUSUAL BRUISING OR BLEEDING *URINARY PROBLEMS (pain or burning when urinating, or frequent urination) *BOWEL PROBLEMS (unusual diarrhea, constipation, pain near the anus) TENDERNESS IN MOUTH AND THROAT WITH OR WITHOUT PRESENCE OF ULCERS (sore throat, sores in mouth, or a toothache) UNUSUAL RASH, SWELLING OR PAIN  UNUSUAL VAGINAL DISCHARGE OR ITCHING   Items with * indicate a potential emergency and should be followed up as soon as possible or go to the  Emergency Department if any problems should occur.  Please show the CHEMOTHERAPY ALERT CARD or IMMUNOTHERAPY ALERT CARD at check-in to the Emergency Department and triage nurse.  Should you have questions after your visit or need to cancel or reschedule your appointment, please contact [facility] [place] [place] [phone]  and follow the prompts.  Office hours are 8:00 a.m. to 4:30 p.m. Monday - Friday. Please note that voicemails left after 4:00 p.m. may not be returned until the following business day.  We are closed weekends and major holidays. You have access to a nurse at all times for urgent questions. Please call the main number to the clinic [phone] and follow the prompts.  For any non-urgent questions, you may also contact your provider using MyChart. We now offer e-Visits for anyone 18 and older to request care online for non-urgent symptoms. For details visit [email or web address].   Also download the MyChart app! Go to the app store, search "MyChart", open the app, select [place], and log in with your MyChart username and password.   

## 2022-11-19 NOTE — Progress Notes (Signed)
Patient presents today for Keytruda infusion. Patient is in satisfactory condition with no new complaints voiced.  Vital signs are stable.  Labs reviewed and all labs are within treatment parameters.  We will proceed with treatment per MD orders.    Treatment given today per MD orders. Tolerated infusion without adverse affects. Vital signs stable. No complaints at this time. Discharged from clinic via wheelchair in stable condition. Alert and oriented x 3. F/U with [facility] as scheduled.

## 2022-11-26 ENCOUNTER — Ambulatory Visit: Payer: HMO | Admitting: Cardiology

## 2022-12-02 ENCOUNTER — Ambulatory Visit (HOSPITAL_COMMUNITY): Payer: HMO

## 2022-12-02 ENCOUNTER — Ambulatory Visit (HOSPITAL_COMMUNITY)
Admission: RE | Admit: 2022-12-02 | Discharge: 2022-12-02 | Disposition: A | Discharge status: Home / Self Care | Payer: HMO | Source: Ambulatory Visit | Attending: Hematology | Admitting: Hematology

## 2022-12-02 ENCOUNTER — Other Ambulatory Visit: Payer: Self-pay

## 2022-12-02 DIAGNOSIS — C3411 Malignant neoplasm of upper lobe, right bronchus or lung: Secondary | ICD-10-CM | POA: Diagnosis present

## 2022-12-02 MED ORDER — IOHEXOL 350 MG/ML SOLN
75.0000 mL | Freq: Once | INTRAVENOUS | Status: AC | PRN
  Administered 2022-12-02: 75 mL via INTRAVENOUS

## 2022-12-06 ENCOUNTER — Other Ambulatory Visit (HOSPITAL_COMMUNITY): Payer: Self-pay

## 2022-12-10 ENCOUNTER — Inpatient Hospital Stay: Payer: HMO

## 2022-12-10 ENCOUNTER — Inpatient Hospital Stay: Payer: HMO | Admitting: Hematology

## 2022-12-10 ENCOUNTER — Inpatient Hospital Stay: Payer: HMO | Attending: Hematology

## 2022-12-10 VITALS — BP 108/64 | HR 63

## 2022-12-10 VITALS — BP 113/62 | HR 65 | Temp 98.2°F | Resp 18 | Wt 185.6 lb

## 2022-12-10 DIAGNOSIS — Z95828 Presence of other vascular implants and grafts: Secondary | ICD-10-CM

## 2022-12-10 DIAGNOSIS — C3411 Malignant neoplasm of upper lobe, right bronchus or lung: Secondary | ICD-10-CM | POA: Diagnosis not present

## 2022-12-10 DIAGNOSIS — Z79620 Long term (current) use of immunosuppressive biologic: Secondary | ICD-10-CM | POA: Diagnosis not present

## 2022-12-10 DIAGNOSIS — Z5112 Encounter for antineoplastic immunotherapy: Secondary | ICD-10-CM | POA: Insufficient documentation

## 2022-12-10 LAB — COMPREHENSIVE METABOLIC PANEL
ALT: 15 U/L (ref 0–44)
AST: 20 U/L (ref 15–41)
Albumin: 3.5 g/dL (ref 3.5–5.0)
Alkaline Phosphatase: 58 U/L (ref 38–126)
Anion gap: 6 (ref 5–15)
BUN: 27 mg/dL — ABNORMAL HIGH (ref 8–23)
CO2: 23 mmol/L (ref 22–32)
Calcium: 8.6 mg/dL — ABNORMAL LOW (ref 8.9–10.3)
Chloride: 106 mmol/L (ref 98–111)
Creatinine, Ser: 1.41 mg/dL — ABNORMAL HIGH (ref 0.61–1.24)
GFR, Estimated: 52 mL/min — ABNORMAL LOW (ref >60)
Glucose, Bld: 99 mg/dL (ref 70–99)
Potassium: 4.1 mmol/L (ref 3.5–5.1)
Sodium: 135 mmol/L (ref 135–145)
Total Bilirubin: 0.7 mg/dL (ref <1.2)
Total Protein: 6.8 g/dL (ref 6.5–8.1)

## 2022-12-10 LAB — CBC WITH DIFFERENTIAL/PLATELET
Abs Immature Granulocytes: 0.02 10*3/uL (ref 0.00–0.07)
Basophils Absolute: 0.1 10*3/uL (ref 0.0–0.1)
Basophils Relative: 1 %
Eosinophils Absolute: 0.3 10*3/uL (ref 0.0–0.5)
Eosinophils Relative: 4 %
HCT: 34.4 % — ABNORMAL LOW (ref 39.0–52.0)
Hemoglobin: 11.4 g/dL — ABNORMAL LOW (ref 13.0–17.0)
Immature Granulocytes: 0 %
Lymphocytes Relative: 18 %
Lymphs Abs: 1.4 10*3/uL (ref 0.7–4.0)
MCH: 32.6 pg (ref 26.0–34.0)
MCHC: 33.1 g/dL (ref 30.0–36.0)
MCV: 98.3 fL (ref 80.0–100.0)
Monocytes Absolute: 0.7 10*3/uL (ref 0.1–1.0)
Monocytes Relative: 9 %
Neutro Abs: 5.2 10*3/uL (ref 1.7–7.7)
Neutrophils Relative %: 68 %
Platelets: 144 10*3/uL — ABNORMAL LOW (ref 150–400)
RBC: 3.5 MIL/uL — ABNORMAL LOW (ref 4.22–5.81)
RDW: 13.7 % (ref 11.5–15.5)
WBC: 7.6 10*3/uL (ref 4.0–10.5)
nRBC: 0 % (ref 0.0–0.2)

## 2022-12-10 LAB — TSH: TSH: 1.304 u[IU]/mL (ref 0.350–4.500)

## 2022-12-10 LAB — MAGNESIUM: Magnesium: 2 mg/dL (ref 1.7–2.4)

## 2022-12-10 MED ORDER — HEPARIN SOD (PORK) LOCK FLUSH 100 UNIT/ML IV SOLN
500.0000 [IU] | Freq: Once | INTRAVENOUS | Status: AC | PRN
  Administered 2022-12-10: 500 [IU]

## 2022-12-10 MED ORDER — SODIUM CHLORIDE 0.9% FLUSH
10.0000 mL | INTRAVENOUS | Status: DC | PRN
  Administered 2022-12-10: 10 mL via INTRAVENOUS

## 2022-12-10 MED ORDER — SODIUM CHLORIDE 0.9 % IV SOLN: Freq: Once | INTRAVENOUS | Status: AC

## 2022-12-10 MED ORDER — PEMBROLIZUMAB CHEMO INJECTION 100 MG/4ML
200.0000 mg | Freq: Once | INTRAVENOUS | Status: AC
  Administered 2022-12-10: 200 mg via INTRAVENOUS
  Filled 2022-12-10: qty 8

## 2022-12-10 NOTE — Progress Notes (Signed)
Patient tolerated chemotherapy with no complaints voiced.  Side effects with management reviewed with understanding verbalized.  Port site clean and dry with no bruising or swelling noted at site.  Good blood return noted before and after administration of chemotherapy.  Band aid applied.  Patient left in satisfactory condition with VSS and no s/s of distress noted. Pt escorted via wheelchair to be driven home by family member. All follow ups as scheduled.   Wilberta [employer]

## 2022-12-10 NOTE — Progress Notes (Signed)
Patients port flushed without difficulty.  Good blood return noted with no bruising or swelling noted at site.  Patient remains accessed for treatment.  

## 2022-12-10 NOTE — Patient Instructions (Signed)
 [facility] A DEPT OF [facility]  Discharge Instructions: Thank you for choosing [facility] to provide your oncology and hematology care.  If you have a lab appointment with the Cancer Center - please note that after April 8th, 2024, all labs will be drawn in the cancer center.  You do not have to check in or register with the main entrance as you have in the past but will complete your check-in in the cancer center.  Wear comfortable clothing and clothing appropriate for easy access to any Portacath or PICC line.   We strive to give you quality time with your provider. You may need to reschedule your appointment if you arrive late (15 or more minutes).  Arriving late affects you and other patients whose appointments are after yours.  Also, if you miss three or more appointments without notifying the office, you may be dismissed from the clinic at the provider's discretion.      For prescription refill requests, have your pharmacy contact our office and allow 72 hours for refills to be completed.    Today you received the following chemotherapy and/or immunotherapy agents keytruda.        To help prevent nausea and vomiting after your treatment, we encourage you to take your nausea medication as directed.  BELOW ARE SYMPTOMS THAT SHOULD BE REPORTED IMMEDIATELY: *FEVER GREATER THAN 100.4 F (38 C) OR HIGHER *CHILLS OR SWEATING *NAUSEA AND VOMITING THAT IS NOT CONTROLLED WITH YOUR NAUSEA MEDICATION *UNUSUAL SHORTNESS OF BREATH *UNUSUAL BRUISING OR BLEEDING *URINARY PROBLEMS (pain or burning when urinating, or frequent urination) *BOWEL PROBLEMS (unusual diarrhea, constipation, pain near the anus) TENDERNESS IN MOUTH AND THROAT WITH OR WITHOUT PRESENCE OF ULCERS (sore throat, sores in mouth, or a toothache) UNUSUAL RASH, SWELLING OR PAIN  UNUSUAL VAGINAL DISCHARGE OR ITCHING   Items with * indicate a potential emergency and should be  followed up as soon as possible or go to the Emergency Department if any problems should occur.  Please show the CHEMOTHERAPY ALERT CARD or IMMUNOTHERAPY ALERT CARD at check-in to the Emergency Department and triage nurse.  Should you have questions after your visit or need to cancel or reschedule your appointment, please contact [facility] - A DEPT OF [facility] [phone]  and follow the prompts.  Office hours are 8:00 a.m. to 4:30 p.m. Monday - Friday. Please note that voicemails left after 4:00 p.m. may not be returned until the following business day.  We are closed weekends and major holidays. You have access to a nurse at all times for urgent questions. Please call the main number to the clinic [phone] and follow the prompts.  For any non-urgent questions, you may also contact your provider using MyChart. We now offer e-Visits for anyone 28 and older to request care online for non-urgent symptoms. For details visit [email or web address].   Also download the MyChart app! Go to the app store, search "MyChart", open the app, select  [facility], and log in with your MyChart username and password.

## 2022-12-20 ENCOUNTER — Other Ambulatory Visit (HOSPITAL_COMMUNITY): Payer: Self-pay

## 2023-01-01 ENCOUNTER — Inpatient Hospital Stay (HOSPITAL_BASED_OUTPATIENT_CLINIC_OR_DEPARTMENT_OTHER): Payer: HMO | Admitting: Hematology

## 2023-01-01 ENCOUNTER — Inpatient Hospital Stay: Payer: HMO

## 2023-01-01 ENCOUNTER — Inpatient Hospital Stay: Payer: HMO | Attending: Hematology

## 2023-01-01 VITALS — BP 112/61 | HR 59 | Temp 97.8°F | Resp 18 | Wt 187.9 lb

## 2023-01-01 DIAGNOSIS — C3411 Malignant neoplasm of upper lobe, right bronchus or lung: Secondary | ICD-10-CM

## 2023-01-01 DIAGNOSIS — E785 Hyperlipidemia, unspecified: Secondary | ICD-10-CM | POA: Diagnosis not present

## 2023-01-01 DIAGNOSIS — F32A Depression, unspecified: Secondary | ICD-10-CM | POA: Insufficient documentation

## 2023-01-01 DIAGNOSIS — I252 Old myocardial infarction: Secondary | ICD-10-CM | POA: Insufficient documentation

## 2023-01-01 DIAGNOSIS — I4891 Unspecified atrial fibrillation: Secondary | ICD-10-CM | POA: Diagnosis not present

## 2023-01-01 DIAGNOSIS — J432 Centrilobular emphysema: Secondary | ICD-10-CM | POA: Diagnosis not present

## 2023-01-01 DIAGNOSIS — Z8673 Personal history of transient ischemic attack (TIA), and cerebral infarction without residual deficits: Secondary | ICD-10-CM | POA: Diagnosis not present

## 2023-01-01 DIAGNOSIS — D649 Anemia, unspecified: Secondary | ICD-10-CM | POA: Diagnosis not present

## 2023-01-01 DIAGNOSIS — I714 Abdominal aortic aneurysm, without rupture, unspecified: Secondary | ICD-10-CM | POA: Diagnosis not present

## 2023-01-01 DIAGNOSIS — Z7901 Long term (current) use of anticoagulants: Secondary | ICD-10-CM | POA: Diagnosis not present

## 2023-01-01 DIAGNOSIS — Z5112 Encounter for antineoplastic immunotherapy: Secondary | ICD-10-CM | POA: Insufficient documentation

## 2023-01-01 DIAGNOSIS — I10 Essential (primary) hypertension: Secondary | ICD-10-CM | POA: Insufficient documentation

## 2023-01-01 DIAGNOSIS — Z87891 Personal history of nicotine dependence: Secondary | ICD-10-CM | POA: Diagnosis not present

## 2023-01-01 LAB — COMPREHENSIVE METABOLIC PANEL
ALT: 17 U/L (ref 0–44)
AST: 23 U/L (ref 15–41)
Albumin: 3.3 g/dL — ABNORMAL LOW (ref 3.5–5.0)
Alkaline Phosphatase: 55 U/L (ref 38–126)
Anion gap: 7 (ref 5–15)
BUN: 21 mg/dL (ref 8–23)
CO2: 23 mmol/L (ref 22–32)
Calcium: 9 mg/dL (ref 8.9–10.3)
Chloride: 106 mmol/L (ref 98–111)
Creatinine, Ser: 1.3 mg/dL — ABNORMAL HIGH (ref 0.61–1.24)
GFR, Estimated: 57 mL/min — ABNORMAL LOW (ref >60)
Glucose, Bld: 96 mg/dL (ref 70–99)
Potassium: 4 mmol/L (ref 3.5–5.1)
Sodium: 136 mmol/L (ref 135–145)
Total Bilirubin: 0.7 mg/dL (ref <1.2)
Total Protein: 6.4 g/dL — ABNORMAL LOW (ref 6.5–8.1)

## 2023-01-01 LAB — CBC WITH DIFFERENTIAL/PLATELET
Abs Immature Granulocytes: 0.03 10*3/uL (ref 0.00–0.07)
Basophils Absolute: 0.1 10*3/uL (ref 0.0–0.1)
Basophils Relative: 1 %
Eosinophils Absolute: 0.3 10*3/uL (ref 0.0–0.5)
Eosinophils Relative: 4 %
HCT: 34.1 % — ABNORMAL LOW (ref 39.0–52.0)
Hemoglobin: 11.2 g/dL — ABNORMAL LOW (ref 13.0–17.0)
Immature Granulocytes: 0 %
Lymphocytes Relative: 20 %
Lymphs Abs: 1.4 10*3/uL (ref 0.7–4.0)
MCH: 32 pg (ref 26.0–34.0)
MCHC: 32.8 g/dL (ref 30.0–36.0)
MCV: 97.4 fL (ref 80.0–100.0)
Monocytes Absolute: 0.7 10*3/uL (ref 0.1–1.0)
Monocytes Relative: 10 %
Neutro Abs: 4.5 10*3/uL (ref 1.7–7.7)
Neutrophils Relative %: 65 %
Platelets: 137 10*3/uL — ABNORMAL LOW (ref 150–400)
RBC: 3.5 MIL/uL — ABNORMAL LOW (ref 4.22–5.81)
RDW: 13.5 % (ref 11.5–15.5)
WBC: 7 10*3/uL (ref 4.0–10.5)
nRBC: 0 % (ref 0.0–0.2)

## 2023-01-01 LAB — MAGNESIUM: Magnesium: 1.9 mg/dL (ref 1.7–2.4)

## 2023-01-01 LAB — TSH: TSH: 0.236 u[IU]/mL — ABNORMAL LOW (ref 0.350–4.500)

## 2023-01-01 MED ORDER — HEPARIN SOD (PORK) LOCK FLUSH 100 UNIT/ML IV SOLN
500.0000 [IU] | Freq: Once | INTRAVENOUS | Status: AC | PRN
  Administered 2023-01-01: 500 [IU]

## 2023-01-01 MED ORDER — PEMBROLIZUMAB CHEMO INJECTION 100 MG/4ML
200.0000 mg | Freq: Once | INTRAVENOUS | Status: AC
  Administered 2023-01-01: 200 mg via INTRAVENOUS
  Filled 2023-01-01: qty 8

## 2023-01-01 MED ORDER — SODIUM CHLORIDE 0.9 % IV SOLN: Freq: Once | INTRAVENOUS | Status: AC

## 2023-01-01 MED ORDER — SODIUM CHLORIDE 0.9% FLUSH
10.0000 mL | INTRAVENOUS | Status: DC | PRN
  Administered 2023-01-01: 10 mL via INTRAVENOUS

## 2023-01-01 MED ORDER — SODIUM CHLORIDE 0.9% FLUSH
10.0000 mL | INTRAVENOUS | Status: DC | PRN
Start: 2023-01-01 — End: 2023-01-01
  Administered 2023-01-01: 10 mL

## 2023-01-01 NOTE — Progress Notes (Signed)
Patient has been examined by Dr. Katragadda. Vital signs and labs have been reviewed by MD - ANC, Creatinine, LFTs, hemoglobin, and platelets are within treatment parameters per M.D. - pt may proceed with treatment.  Primary RN and pharmacy notified.  

## 2023-01-01 NOTE — Progress Notes (Signed)
[facility] [address] [place] [address]    Clinic Day:  01/01/23  Referring physician: [name], DO  Patient Care Team: [name], DO as PCP - General (Family Medicine) [name], MD as PCP - Cardiology (Cardiology) [name], MD as Consulting Physician (Gastroenterology) [name], RN as Oncology Nurse Navigator (Medical Oncology) [name], MD as Medical Oncologist (Medical Oncology) [name], MD as Consulting Physician (Hematology)   ASSESSMENT & PLAN:   Assessment: 1. 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  Occasional additional small pulmonary nodules measuring 0.4 cm and smaller. - Bronchoscopy (06/11/2021): RUL nodule brushing and FNA: Malignant cells with features of adenocarcinoma. - PET scan (06/22/2021): Subsolid nodular lesion in the right upper lobe, hypermetabolic SUV 15.  Small right hilar and infrahilar lymph nodes with SUV 4.03 concerning for metastatic adenopathy.  Small hypermetabolic left parotid gland lesion, consistent with previous history of Wharton's tumor.  Hypermetabolic focus in the transverse colon SUV 8.69. - MRI of the brain (07/11/2021): No evidence of metastatic disease.  Right sphenoid wing meningioma stable since 2022. - He was evaluated by Dr. Dorris Fetch and was recommended to undergo neoadjuvant chemoimmunotherapy followed by surgical resection. - 3 cycles of carboplatin, pemetrexed from 08/06/2021 through 10/02/2021 (opdivo given with only cycle 2, discontinued for cycle 3 due to cardiac problems) - NGS testing not performed due to insufficient sample. - Guardant360: Negative for EGFR and ALK.  MSI high not detected. - Right upper lobectomy and lymph node dissection (11/12/2021): 1.2 cm invasive adenocarcinoma, moderately differentiated, margins  negative, pT1b pN0, 0/17 lymph nodes positive from stations 4R, 7, 8, 9, 10R, 11 R and 12 R.  Negative for visceral pleural involvement, LVI.  PD-L1 TPS is 1%. - Adjuvant pembrolizumab started on 03/11/2022.  Pembrolizumab held after 3 cycles due to fall and right fracture, s/p right hemiarthroplasty.   2. Social/family history: - Lives at home with his wife.  Uses cane occasionally after he had stroke in January 2022.  He has retired after working in [employer].  He is current active smoker, 1 pack/day for 60 years.  No exposure to chemicals. - Brother died of metastatic cancer.  Maternal uncle had lung cancer.    Plan: 1. 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  No GI side effects noted. - Labs today: Creatinine 1.30.  Rest of LFTs are normal.  CBC grossly normal with mild thrombocytopenia of 137. - TSH is low at 0.2, down from 1.3 at last visit. - CT chest (12/02/2022): No evidence of recurrence.  No lymphadenopathy.  4.2 cm abdominal aortic aneurysm.  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 - He will continue pembrolizumab today and every 3 weeks.  RTC 6 weeks for follow-up.  Will plan on repeating TSH and free T4 at that time.  2.  Normocytic anemia: - Last Feraheme was on 06/13/2022.  Ferritin was 372 and percent saturation 37.  Hemoglobin today is 11.2.   3.  Hypertension/A-fib: - Continue metoprolol 25 mg daily.  Blood pressure is 98/62 and heart rate of 65.   4.  Depression: - Continue Lexapro 10 mg daily.    No orders of the defined types were placed in this encounter.   [name],  MD   12/11/20241:13 PM  CHIEF COMPLAINT:   Diagnosis: right upper lobe lung adenocarcinoma    Cancer Staging  Primary adenocarcinoma of upper lobe of right lung (HCC) Staging form: Lung, AJCC 8th Edition - Clinical stage from 06/26/2021: cT1, cN1, cM0 - Unsigned     Prior Therapy: 1. 3 cycles Neoadjuvant chemoimmunotherapy with carboplatin, pemetrexed from 08/06/21 through 10/02/21 2. RUL lobectomy and LND on 10/23/203  Current Therapy:  Adjuvant pembrolizumab    HISTORY OF PRESENT ILLNESS:   Oncology History  Primary adenocarcinoma of upper lobe of right lung (HCC)  06/26/2021 Initial Diagnosis   Primary adenocarcinoma of upper lobe of right lung (HCC)   08/06/2021 - 08/27/2021 Chemotherapy   Patient is on Treatment Plan : LUNG NSCLC Pemetrexed + Carboplatin q21d x 4 Cycles     08/06/2021 - 10/02/2021 Chemotherapy   Patient is on Treatment Plan : LUNG NSCLC Pemetrexed + Carboplatin q21d x 4 Cycles     03/11/2022 -  Chemotherapy   Patient is on Treatment Plan : LUNG NSCLC Pembrolizumab (200) q21d        INTERVAL HISTORY:   Darryl Ward is a 77 y.o. male seen for follow-up of right upper lobe lung adenocarcinoma.  He reports appetite is 100% energy levels of 60%.  Reports that he walks with a cane and has occasional falls averaging about twice a month.  PAST MEDICAL HISTORY:   Past Medical History: Past Medical History:  Diagnosis Date  . AAA (abdominal aortic aneurysm) (HCC)   . Arthritis   . Cancer (HCC)   . Carotid artery disease (HCC)    Nonobstructive  . Cataract   . COPD (chronic obstructive pulmonary disease) (HCC)   . Coronary atherosclerosis of native coronary artery    PTCA small diagonal 2007 otherwise nonobstructive CAD  . Depression   . Dysrhythmia   . Essential hypertension, benign   . Hyperlipidemia   . NSTEMI (non-ST elevated myocardial infarction) (HCC) 2007  . Stroke [facility]) 2004  . TIA (transient ischemic attack) 2006    Surgical History: Past Surgical History:  Procedure Laterality Date  . AORTA - BILATERAL FEMORAL ARTERY BYPASS GRAFT  01/08/2012   Procedure: AORTA BIFEMORAL BYPASS GRAFT;  Surgeon: Sherren Kerns, MD;  Location: [facility] OR;  Service: Vascular;  Laterality: Bilateral;  using 18x50mm x 40cm Hemashield Gold  Vascular Graft with Endarterectomy, Thombectomy and  Reimplantation of Inferior Mesenteric Artery  . BACK SURGERY  2021  . BRONCHIAL BIOPSY  06/11/2021   Procedure: BRONCHIAL BIOPSIES;  Surgeon: Leslye Peer, MD;  Location: [facility] ENDOSCOPY;  Service: Pulmonary;;  . BRONCHIAL BRUSHINGS  06/11/2021   Procedure: BRONCHIAL BRUSHINGS;  Surgeon: Leslye Peer, MD;  Location: [facility] ENDOSCOPY;  Service: Pulmonary;;  . BRONCHIAL NEEDLE ASPIRATION BIOPSY  06/11/2021   Procedure: BRONCHIAL NEEDLE ASPIRATION BIOPSIES;  Surgeon: Leslye Peer, MD;  Location: [facility] ENDOSCOPY;  Service: Pulmonary;;  . CARDIOVERSION N/A 09/20/2021   Procedure: CARDIOVERSION;  Surgeon: [name], MD;  Location: [facility] ORS;  Service: Cardiovascular;  Laterality: N/A;  . COLONOSCOPY N/A 04/14/2019   Procedure: COLONOSCOPY;  Surgeon: Corbin Ade, MD;  Location: [facility] ENDO SUITE;  Service: Endoscopy;  Laterality: N/A;  9:30  . COLONOSCOPY WITH PROPOFOL N/A 07/25/2021   Procedure: COLONOSCOPY WITH PROPOFOL;  Surgeon: Lanelle Bal, DO;  Location: [facility] ENDO SUITE;  Service: Endoscopy;  Laterality: N/A;  1:00pm  . FIDUCIAL MARKER PLACEMENT  06/11/2021   Procedure: FIDUCIAL MARKER PLACEMENT;  Surgeon: Leslye Peer, MD;  Location: [facility] ENDOSCOPY;  Service: Pulmonary;;  . HIP ARTHROPLASTY Right 04/28/2022   Procedure: ARTHROPLASTY BIPOLAR HIP (HEMIARTHROPLASTY);  Surgeon: Sheral Apley, MD;  Location: [facility] OR;  Service: Orthopedics;  Laterality: Right;  . INTERCOSTAL NERVE BLOCK Right 11/12/2021   Procedure: INTERCOSTAL NERVE BLOCK;  Surgeon: Loreli Slot, MD;  Location: [facility] OR;  Service: Thoracic;  Laterality: Right;  . IR IMAGING GUIDED PORT INSERTION  07/31/2021  . Left cataract surgery    . LYMPH NODE DISSECTION Right 11/12/2021   Procedure: LYMPH NODE DISSECTION;  Surgeon: Loreli Slot, MD;  Location: [facility] OR;  Service: Thoracic;  Laterality: Right;  . POLYPECTOMY  04/14/2019   Procedure: POLYPECTOMY;   Surgeon: Corbin Ade, MD;  Location: [facility] ENDO SUITE;  Service: Endoscopy;;  . POLYPECTOMY  07/25/2021   Procedure: POLYPECTOMY;  Surgeon: Lanelle Bal, DO;  Location: [facility] ENDO SUITE;  Service: Endoscopy;;  . TEE WITHOUT CARDIOVERSION N/A 09/20/2021   Procedure: TRANSESOPHAGEAL ECHOCARDIOGRAM (TEE);  Surgeon: [name], MD;  Location: [facility] ORS;  Service: Cardiovascular;  Laterality: N/A;  . TRANSFORAMINAL LUMBAR INTERBODY FUSION (TLIF) WITH PEDICLE SCREW FIXATION 1 LEVEL N/A 04/27/2020   Procedure: Transforaminal Lumbar Interbody Fusion Lumbar Five-Sacral One;  Surgeon: Bedelia Person, MD;  Location: [facility] OR;  Service: Neurosurgery;  Laterality: N/A;  . VIDEO BRONCHOSCOPY WITH INSERTION OF INTERBRONCHIAL VALVE (IBV) N/A 11/22/2021   Procedure: VIDEO BRONCHOSCOPY WITH INSERTION OF INTERBRONCHIAL VALVE (IBV);  Surgeon: Loreli Slot, MD;  Location: [facility] OR;  Service: Thoracic;  Laterality: N/A;  . VIDEO BRONCHOSCOPY WITH INSERTION OF INTERBRONCHIAL VALVE (IBV) N/A 01/24/2022   Procedure: VIDEO BRONCHOSCOPY WITH REMOVAL OF INTERBRONCHIAL VALVE (IBV);  Surgeon: Loreli Slot, MD;  Location: [facility] OR;  Service: Thoracic;  Laterality: N/A;  . VIDEO BRONCHOSCOPY WITH RADIAL ENDOBRONCHIAL ULTRASOUND  06/11/2021   Procedure: VIDEO BRONCHOSCOPY WITH RADIAL ENDOBRONCHIAL ULTRASOUND;  Surgeon: Leslye Peer, MD;  Location: [facility] ENDOSCOPY;  Service: Pulmonary;;    Social History: Social History   Socioeconomic History  . Marital status: Divorced    Spouse name: Not on file  . Number of children: 1  . Years of education: 40  . Highest education level: 11th grade  Occupational History    Employer: Engineer, materials  Tobacco Use  . Smoking status: Former    Current packs/day: 0.00    Average packs/day: 1 pack/day for 40.0 years (40.0 ttl pk-yrs)    Types: Cigarettes    Start date: 07/1981    Quit date: 07/2021    Years since quitting: 1.4  . Smokeless tobacco: Never  .  Tobacco comments:    1 pack of cigarettes smoked daily. 07/17/21 ARJ, RN   Vaping Use  . Vaping status: Never Used  Substance and Sexual Activity  . Alcohol use: No    Comment: Prior history of regular alcohol use  . Drug use: No  . Sexual activity: Not Currently  Other Topics Concern  . Not on file  Social History Narrative  . Not on file   Social Determinants of Health   Financial Resource Strain: High Risk (08/30/2022)   Overall Financial Resource Strain (CARDIA)   . Difficulty of Paying Living Expenses: Hard  Food Insecurity: Food Insecurity Present (09/13/2022)   Hunger Vital Sign   . Worried About Programme researcher, broadcasting/film/video in the Last Year: Never true   . Ran Out of Food in the Last Year: Sometimes true  Transportation Needs: No Transportation Needs (09/13/2022)   PRAPARE -  Transportation   . Lack of Transportation (Medical): No   . Lack of Transportation (Non-Medical): No  Physical Activity: Insufficiently Active (08/30/2022)   Exercise Vital Sign   . Days of Exercise per Week: 7 days   . Minutes of Exercise per Session: 20 min  Stress: No Stress Concern Present (08/30/2022)   [employer] of Occupational Health - Occupational Stress Questionnaire   . Feeling of Stress : Not at all  Social Connections: Socially Isolated (08/30/2022)   Social Connection and Isolation Panel [NHANES]   . Frequency of Communication with Friends and Family: Twice a week   . Frequency of Social Gatherings with Friends and Family: Twice a week   . Attends Religious Services: Never   . Active Member of Clubs or Organizations: No   . Attends Banker Meetings: Never   . Marital Status: Divorced  Catering manager Violence: Not At Risk (08/30/2022)   Humiliation, Afraid, Rape, and Kick questionnaire   . Fear of Current or Ex-Partner: No   . Emotionally Abused: No   . Physically Abused: No   . Sexually Abused: No    Family History: Family History  Problem Relation Age of Onset  .  Hyperlipidemia Sister   . Heart attack Brother 52  . Cancer - Colon Neg Hx     Current Medications:  Current Outpatient Medications:  .  acetaminophen (TYLENOL) 500 MG tablet, GIVE 2 TABLETS (1,000 mg TOTAL) BY MOUTH EVERY 6 HOURS AS NEEDED FOR PAIN, Disp: 60 tablet, Rfl: 0 .  albuterol (VENTOLIN HFA) 108 (90 Base) MCG/ACT inhaler, Inhale 2 puffs into the lungs every 6 (six) hours as needed for wheezing or shortness of breath., Disp: 6.7 g, Rfl: 6 .  apixaban (ELIQUIS) 5 MG TABS tablet, Take 1 tablet (5 mg total) by mouth 2 (two) times daily., Disp: 180 tablet, Rfl: 3 .  atorvastatin (LIPITOR) 80 MG tablet, Take 1 tablet (80 mg total) by mouth daily., Disp: 90 tablet, Rfl: 3 .  CARBOplatin (PARAPLATIN) 50 MG/5ML injection, as directed Intravenous, Disp: , Rfl:  .  chlorthalidone (HYGROTON) 25 MG tablet, 1 tablet   every other day Orally Once a day, Disp: , Rfl:  .  cyclobenzaprine (FLEXERIL) 10 MG tablet, Take 1 tablet (10 mg total) by mouth 2 (two) times daily as needed., Disp: 60 tablet, Rfl: 2 .  escitalopram (LEXAPRO) 10 MG tablet, Take 1 tablet (10 mg total) by mouth daily., Disp: 90 tablet, Rfl: 3 .  folic acid (KP FOLIC ACID) 1 MG tablet, Take 1 tablet (1 mg total) by mouth daily., Disp: 90 tablet, Rfl: 3 .  furosemide (LASIX) 20 MG tablet, Take 1 tablet (20 mg total) by mouth daily as needed., Disp: 90 tablet, Rfl: 2 .  HYDROcodone-acetaminophen (NORCO/VICODIN) 5-325 MG tablet, Take 1 tablet by mouth every 6 (six) hours as needed, for pain., Disp: 60 tablet, Rfl: 0 .  magnesium oxide (MAG-OX) 400 (240 Mg) MG tablet, Take 1 tablet (400 mg total) by mouth daily., Disp: 120 tablet, Rfl: 3 .  metoprolol succinate (TOPROL-XL) 25 MG 24 hr tablet, Take 25 mg by mouth daily., Disp: , Rfl:  .  pantoprazole (PROTONIX) 40 MG tablet, Take 1 tablet (40 mg total) by mouth daily., Disp: 90 tablet, Rfl: 3 .  PEMEtrexed (PEMFEXY) 500 MG/20ML injection, as directed Intravenous, Disp: , Rfl:  .   tamsulosin (FLOMAX) 0.4 MG CAPS capsule, Take 1 capsule (0.4 mg total) by mouth daily., Disp: 90 capsule, Rfl: 3 .  Tiotropium Bromide Monohydrate (SPIRIVA RESPIMAT) 2.5 MCG/ACT AERS, Inhale 2 puffs into the lungs daily., Disp: 4 g, Rfl: 3 No current facility-administered medications for this visit.  Facility-Administered Medications Ordered in Other Visits:  .  sodium chloride flush (NS) 0.9 % injection 10 mL, 10 mL, Intracatheter, PRN, [name], MD, 10 mL at 07/10/22 1513   Allergies: No Known Allergies  REVIEW OF SYSTEMS:   Review of Systems  Constitutional:  Negative for chills, fatigue and fever.  HENT:   Negative for lump/mass, mouth sores, nosebleeds, sore throat and trouble swallowing.   Eyes:  Negative for eye problems.  Respiratory:  Negative for cough and shortness of breath.   Cardiovascular:  Negative for chest pain, leg swelling and palpitations.  Gastrointestinal:  Negative for abdominal pain, constipation, diarrhea, nausea and vomiting.  Genitourinary:  Negative for bladder incontinence, difficulty urinating, dysuria, frequency, hematuria and nocturia.   Musculoskeletal:  Negative for arthralgias, back pain, flank pain, myalgias and neck pain.  Skin:  Negative for itching and rash.  Neurological:  Positive for numbness. Negative for dizziness and headaches.  Hematological:  Does not bruise/bleed easily.  Psychiatric/Behavioral:  Negative for depression, sleep disturbance and suicidal ideas. The patient is not nervous/anxious.   All other systems reviewed and are negative.    VITALS:   There were no vitals taken for this visit.  Wt Readings from Last 3 Encounters:  12/10/22 185 lb 9.6 oz (84.2 kg)  11/19/22 181 lb 6.4 oz (82.3 kg)  10/29/22 180 lb 3.2 oz (81.7 kg)    There is no height or weight on file to calculate BMI.  Performance status (ECOG): 1 - Symptomatic but completely ambulatory  PHYSICAL EXAM:   Physical Exam Vitals and nursing note  reviewed. Exam conducted with a chaperone present.  Constitutional:      Appearance: Normal appearance.  Cardiovascular:     Rate and Rhythm: Normal rate and regular rhythm.     Pulses: Normal pulses.     Heart sounds: Normal heart sounds.  Pulmonary:     Effort: Pulmonary effort is normal.     Breath sounds: Normal breath sounds.  Abdominal:     Palpations: Abdomen is soft. There is no hepatomegaly, splenomegaly or mass.     Tenderness: There is no abdominal tenderness.  Musculoskeletal:     Right lower leg: No edema.     Left lower leg: No edema.  Lymphadenopathy:     Cervical: No cervical adenopathy.     Right cervical: No superficial, deep or posterior cervical adenopathy.    Left cervical: No superficial, deep or posterior cervical adenopathy.     Upper Body:     Right upper body: No supraclavicular or axillary adenopathy.     Left upper body: No supraclavicular or axillary adenopathy.  Neurological:     General: No focal deficit present.     Mental Status: He is alert and oriented to person, place, and time.  Psychiatric:        Mood and Affect: Mood normal.        Behavior: Behavior normal.    LABS:      Latest Ref Rng & Units 01/01/2023   11:26 AM 12/10/2022   11:58 AM 11/19/2022    1:16 PM  CBC  WBC 4.0 - 10.5 K/uL 7.0  7.6  6.5   Hemoglobin 13.0 - 17.0 g/dL 16.1  09.6  04.5   Hematocrit 39.0 - 52.0 % 34.1  34.4  33.5   Platelets  150 - 400 K/uL 137  144  134       Latest Ref Rng & Units 01/01/2023   11:26 AM 12/10/2022   11:58 AM 11/19/2022    1:16 PM  CMP  Glucose 70 - 99 mg/dL 96  99  433   BUN 8 - 23 mg/dL 21  27  24    Creatinine 0.61 - 1.24 mg/dL 2.95  1.88  4.16   Sodium 135 - 145 mmol/L 136  135  136   Potassium 3.5 - 5.1 mmol/L 4.0  4.1  4.3   Chloride 98 - 111 mmol/L 106  106  106   CO2 22 - 32 mmol/L 23  23  22    Calcium 8.9 - 10.3 mg/dL 9.0  8.6  8.8   Total Protein 6.5 - 8.1 g/dL 6.4  6.8  6.6   Total Bilirubin <1.2 mg/dL 0.7  0.7  0.7    Alkaline Phos 38 - 126 U/L 55  58  73   AST 15 - 41 U/L 23  20  19    ALT 0 - 44 U/L 17  15  14       No results found for: "CEA1", "CEA" / No results found for: "CEA1", "CEA" Lab Results  Component Value Date   PSA1 1.2 11/25/2019   No results found for: "SAY301" No results found for: "CAN125"  No results found for: "TOTALPROTELP", "ALBUMINELP", "A1GS", "A2GS", "BETS", "BETA2SER", "GAMS", "MSPIKE", "SPEI" Lab Results  Component Value Date   TIBC 233 (L) 08/21/2022   TIBC 253 06/13/2022   TIBC 291 04/01/2022   FERRITIN 372 (H) 08/21/2022   FERRITIN 501 (H) 06/13/2022   FERRITIN 86 04/01/2022   IRONPCTSAT 37 08/21/2022   IRONPCTSAT 24 06/13/2022   IRONPCTSAT 22 04/01/2022   No results found for: "LDH"   STUDIES:   CT Chest W Contrast  Result Date: 12/17/2022 CLINICAL DATA:  Staging right upper lung non-small lung cancer. * Tracking Code: BO * EXAM: CT CHEST WITH CONTRAST TECHNIQUE: Multidetector CT imaging of the chest was performed during intravenous contrast administration. RADIATION DOSE REDUCTION: This exam was performed according to the departmental dose-optimization program which includes automated exposure control, adjustment of the mA and/or kV according to patient size and/or use of iterative reconstruction technique. CONTRAST:  75mL OMNIPAQUE IOHEXOL 350 MG/ML SOLN COMPARISON:  CT 07/24/2022 and older FINDINGS: Cardiovascular: Left upper chest port in place with the tip seen extending to the SVC right atrial junction region. The heart itself is nonenlarged. Trace pericardial fluid. Significant coronary artery calcifications are seen. Thoracic aorta has a normal course and caliber with scattered vascular partially calcified plaque. Areas of plaque are irregular. Extension into the great vessels as well. The left vertebral artery appears occluded. There is also what appears to be an aneurysm of the upper abdominal aorta with diameter approaching 4.2 cm. At the edge of the  imaging field is a right renal artery aneurysm which is partially calcified measuring 13 mm. Mediastinum/Nodes: Preserved thyroid gland. Thoracic esophagus has a normal course and caliber. There is some luminal debris. No specific abnormal lymph node enlargement identified in the axillary regions, hila. There are some small less than 1 cm size mediastinal nodes, nonpathologic by size criteria. Lungs/Pleura: Left lung is without consolidation, pneumothorax or effusion. Stable left lower lobe air cysts. There are some paraseptal emphysematous changes and centrilobular changes. Upper lung areas of pleural thickening, interstitial septal thickening. Right lung has some upper lobe volume loss with surgical changes, nodular  pleural thickening. Areas of scarring and fibrotic change extending back to the hilum. Extent and distribution is similar to the previous examination. No developing new mass lesion, consolidation or pleural effusion. No pneumothorax. There is some thickening along the trachea with some debris as seen on series 3, image 27. Upper Abdomen: Adrenal glands are preserved. Stones in the nondilated gallbladder. Musculoskeletal: Degenerative changes of the spine. Old chronic right rib deformities. IMPRESSION: Stable posttreatment changes along the right upper thorax with volume loss, surgical changes and thickening. No developing new mass lesion, fluid collection or lymph node enlargement in the thorax. 4.2 cm abdominal aortic aneurysm in the upper portion of the abdomen. Recommend follow-up CT or MR as appropriate in 12 months and referral to or continued care with vascular specialist. (Ref.: J Vasc Surg. 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 Based on size recommend vascular surgical consultation when clinically appropriate. Gallstones. Aortic Atherosclerosis (ICD10-I70.0) and Emphysema (ICD10-J43.9). Electronically Signed   By: Karen Kays M.D.   On: 12/17/2022 11:39

## 2023-01-01 NOTE — Patient Instructions (Signed)
[facility] at [facility] Discharge Instructions   You were seen and examined today by Dr. Ellin Saba.  He reviewed the results of your lab work which are normal/stable.   He reviewed the results of your CT scan. There is no evidence of cancer seen on this exam.  We will proceed with your treatment today.   Return as scheduled.    Thank you for choosing [facility] at [facility] to provide your oncology and hematology care.  To afford each patient quality time with our provider, please arrive at least 15 minutes before your scheduled appointment time.   If you have a lab appointment with the Cancer Center please come in thru the Main Entrance and check in at the main information desk.  You need to re-schedule your appointment should you arrive 10 or more minutes late.  We strive to give you quality time with our providers, and arriving late affects you and other patients whose appointments are after yours.  Also, if you no show three or more times for appointments you may be dismissed from the clinic at the providers discretion.     Again, thank you for choosing [facility].  Our hope is that these requests will decrease the amount of time that you wait before being seen by our physicians.       _____________________________________________________________  Should you have questions after your visit to [facility], please contact our office at [phone] and follow the prompts.  Our office hours are 8:00 a.m. and 4:30 p.m. Monday - Friday.  Please note that voicemails left after 4:00 p.m. may not be returned until the following business day.  We are closed weekends and major holidays.  You do have access to a nurse 24-7, just call the main number to the clinic [phone] and do not press any options, hold on the line and a nurse will answer the phone.    For prescription refill requests, have your pharmacy contact  our office and allow 72 hours.    Due to Covid, you will need to wear a mask upon entering the hospital. If you do not have a mask, a mask will be given to you at the Main Entrance upon arrival. For doctor visits, patients may have 1 support person age 73 or older with them. For treatment visits, patients can not have anyone with them due to social distancing guidelines and our immunocompromised population.

## 2023-01-01 NOTE — Progress Notes (Signed)
Patient presents today for Keytruda infusion and follow up with Dr. Ellin Saba. Vital signs and labs within parameters for treatment.  Message received from A.Dareen Piano RN / Dr. Ellin Saba to proceed with treatment.   Keytruda given today per MD orders. Tolerated infusion without adverse affects. Vital signs stable. No complaints at this time. Discharged from clinic ambulatory in stable condition. Alert and oriented x 3. F/U with [facility] as scheduled.

## 2023-01-01 NOTE — Patient Instructions (Signed)
[facility] [place] - [facility]  Discharge Instructions: Thank you for choosing  Cancer Center to provide your oncology and hematology care.  If you have a lab appointment with the Cancer Center - please note that after April 8th, 2024, all labs will be drawn in the cancer center.  You do not have to check in or register with the main entrance as you have in the past but will complete your check-in in the cancer center.  Wear comfortable clothing and clothing appropriate for easy access to any Portacath or PICC line.   We strive to give you quality time with your provider. You may need to reschedule your appointment if you arrive late (15 or more minutes).  Arriving late affects you and other patients whose appointments are after yours.  Also, if you miss three or more appointments without notifying the office, you may be dismissed from the clinic at the provider's discretion.      For prescription refill requests, have your pharmacy contact our office and allow 72 hours for refills to be completed.    Today you received the following chemotherapy and/or immunotherapy agents Keytruda. Pembrolizumab Injection What is this medication? PEMBROLIZUMAB (PEM broe LIZ ue mab) treats some types of cancer. It works by helping your immune system slow or stop the spread of cancer cells. It is a monoclonal antibody. This medicine may be used for other purposes; ask your health care provider or pharmacist if you have questions. COMMON BRAND NAME(S): Keytruda What should I tell my care team before I take this medication? They need to know if you have any of these conditions: Allogeneic stem cell transplant (uses someone else's stem cells) Autoimmune diseases, such as Crohn disease, ulcerative colitis, lupus History of chest radiation Nervous system problems, such as Guillain-Barre syndrome, myasthenia gravis Organ transplant An unusual or allergic reaction to  pembrolizumab, other medications, foods, dyes, or preservatives Pregnant or trying to get pregnant Breast-feeding How should I use this medication? This medication is injected into a vein. It is given by your care team in a hospital or clinic setting. A special MedGuide will be given to you before each treatment. Be sure to read this information carefully each time. Talk to your care team about the use of this medication in children. While it may be prescribed for children as young as 6 months for selected conditions, precautions do apply. Overdosage: If you think you have taken too much of this medicine contact a poison control center or emergency room at once. NOTE: This medicine is only for you. Do not share this medicine with others. What if I miss a dose? Keep appointments for follow-up doses. It is important not to miss your dose. Call your care team if you are unable to keep an appointment. What may interact with this medication? Interactions have not been studied. This list may not describe all possible interactions. Give your health care provider a list of all the medicines, herbs, non-prescription drugs, or dietary supplements you use. Also tell them if you smoke, drink alcohol, or use illegal drugs. Some items may interact with your medicine. What should I watch for while using this medication? Your condition will be monitored carefully while you are receiving this medication. You may need blood work while taking this medication. This medication may cause serious skin reactions. They can happen weeks to months after starting the medication. Contact your care team right away if you notice  fevers or flu-like symptoms with a rash. The rash may be red or purple and then turn into blisters or peeling of the skin. You may also notice a red rash with swelling of the face, lips, or lymph nodes in your neck or under your arms. Tell your care team right away if you have any change in your  eyesight. Talk to your care team if you may be pregnant. Serious birth defects can occur if you take this medication during pregnancy and for 4 months after the last dose. You will need a negative pregnancy test before starting this medication. Contraception is recommended while taking this medication and for 4 months after the last dose. Your care team can help you find the option that works for you. Do not breastfeed while taking this medication and for 4 months after the last dose. What side effects may I notice from receiving this medication? Side effects that you should report to your care team as soon as possible: Allergic reactions--skin rash, itching, hives, swelling of the face, lips, tongue, or throat Dry cough, shortness of breath or trouble breathing Eye pain, redness, irritation, or discharge with blurry or decreased vision Heart muscle inflammation--unusual weakness or fatigue, shortness of breath, chest pain, fast or irregular heartbeat, dizziness, swelling of the ankles, feet, or hands Hormone gland problems--headache, sensitivity to light, unusual weakness or fatigue, dizziness, fast or irregular heartbeat, increased sensitivity to cold or heat, excessive sweating, constipation, hair loss, increased thirst or amount of urine, tremors or shaking, irritability Infusion reactions--chest pain, shortness of breath or trouble breathing, feeling faint or lightheaded Kidney injury (glomerulonephritis)--decrease in the amount of urine, red or dark brown urine, foamy or bubbly urine, swelling of the ankles, hands, or feet Liver injury--right upper belly pain, loss of appetite, nausea, light-colored stool, dark yellow or brown urine, yellowing skin or eyes, unusual weakness or fatigue Pain, tingling, or numbness in the hands or feet, muscle weakness, change in vision, confusion or trouble speaking, loss of balance or coordination, trouble walking, seizures Rash, fever, and swollen lymph  nodes Redness, blistering, peeling, or loosening of the skin, including inside the mouth Sudden or severe stomach pain, bloody diarrhea, fever, nausea, vomiting Side effects that usually do not require medical attention (report to your care team if they continue or are bothersome): Bone, joint, or muscle pain Diarrhea Fatigue Loss of appetite Nausea Skin rash This list may not describe all possible side effects. Call your doctor for medical advice about side effects. You may report side effects to FDA at 1-800-FDA-1088. Where should I keep my medication? This medication is given in a hospital or clinic. It will not be stored at home. NOTE: This sheet is a summary. It may not cover all possible information. If you have questions about this medicine, talk to your doctor, pharmacist, or health care provider.  2024 Elsevier/Gold Standard (2021-05-22 00:00:00)       To help prevent nausea and vomiting after your treatment, we encourage you to take your nausea medication as directed.  BELOW ARE SYMPTOMS THAT SHOULD BE REPORTED IMMEDIATELY: *FEVER GREATER THAN 100.4 F (38 C) OR HIGHER *CHILLS OR SWEATING *NAUSEA AND VOMITING THAT IS NOT CONTROLLED WITH YOUR NAUSEA MEDICATION *UNUSUAL SHORTNESS OF BREATH *UNUSUAL BRUISING OR BLEEDING *URINARY PROBLEMS (pain or burning when urinating, or frequent urination) *BOWEL PROBLEMS (unusual diarrhea, constipation, pain near the anus) TENDERNESS IN MOUTH AND THROAT WITH OR WITHOUT PRESENCE OF ULCERS (sore throat, sores in mouth, or a toothache) UNUSUAL RASH, SWELLING  OR PAIN  UNUSUAL VAGINAL DISCHARGE OR ITCHING   Items with * indicate a potential emergency and should be followed up as soon as possible or go to the Emergency Department if any problems should occur.  Please show the CHEMOTHERAPY ALERT CARD or IMMUNOTHERAPY ALERT CARD at check-in to the Emergency Department and triage nurse.  Should you have questions after your visit or need to  cancel or reschedule your appointment, please contact [facility] CANCER CTR [place] - A DEPT OF [facility] [phone]  and follow the prompts.  Office hours are 8:00 a.m. to 4:30 p.m. Monday - Friday. Please note that voicemails left after 4:00 p.m. may not be returned until the following business day.  We are closed weekends and major holidays. You have access to a nurse at all times for urgent questions. Please call the main number to the clinic [phone] and follow the prompts.  For any non-urgent questions, you may also contact your provider using MyChart. We now offer e-Visits for anyone 42 and older to request care online for non-urgent symptoms. For details visit [email or web address].   Also download the MyChart app! Go to the app store, search "MyChart", open the app, select [place], and log in with your MyChart username and password.

## 2023-01-02 LAB — T4: T4, Total: 9.8 ug/dL (ref 4.5–12.0)

## 2023-01-13 ENCOUNTER — Other Ambulatory Visit (HOSPITAL_COMMUNITY): Payer: Self-pay

## 2023-01-24 ENCOUNTER — Inpatient Hospital Stay: Payer: HMO | Attending: Hematology

## 2023-01-24 ENCOUNTER — Inpatient Hospital Stay: Payer: HMO

## 2023-01-24 VITALS — BP 101/60 | HR 65 | Temp 98.3°F | Resp 18 | Wt 191.4 lb

## 2023-01-24 VITALS — BP 122/62 | HR 58 | Temp 97.0°F | Resp 18

## 2023-01-24 DIAGNOSIS — C3411 Malignant neoplasm of upper lobe, right bronchus or lung: Secondary | ICD-10-CM

## 2023-01-24 DIAGNOSIS — Z5112 Encounter for antineoplastic immunotherapy: Secondary | ICD-10-CM | POA: Diagnosis present

## 2023-01-24 DIAGNOSIS — Z79899 Other long term (current) drug therapy: Secondary | ICD-10-CM | POA: Insufficient documentation

## 2023-01-24 LAB — CBC WITH DIFFERENTIAL/PLATELET
Abs Immature Granulocytes: 0.01 10*3/uL (ref 0.00–0.07)
Basophils Absolute: 0.1 10*3/uL (ref 0.0–0.1)
Basophils Relative: 1 %
Eosinophils Absolute: 0.3 10*3/uL (ref 0.0–0.5)
Eosinophils Relative: 5 %
HCT: 36.8 % — ABNORMAL LOW (ref 39.0–52.0)
Hemoglobin: 11.7 g/dL — ABNORMAL LOW (ref 13.0–17.0)
Immature Granulocytes: 0 %
Lymphocytes Relative: 20 %
Lymphs Abs: 1.2 10*3/uL (ref 0.7–4.0)
MCH: 31 pg (ref 26.0–34.0)
MCHC: 31.8 g/dL (ref 30.0–36.0)
MCV: 97.4 fL (ref 80.0–100.0)
Monocytes Absolute: 0.4 10*3/uL (ref 0.1–1.0)
Monocytes Relative: 7 %
Neutro Abs: 3.9 10*3/uL (ref 1.7–7.7)
Neutrophils Relative %: 67 %
Platelets: 140 10*3/uL — ABNORMAL LOW (ref 150–400)
RBC: 3.78 MIL/uL — ABNORMAL LOW (ref 4.22–5.81)
RDW: 13.5 % (ref 11.5–15.5)
WBC: 5.9 10*3/uL (ref 4.0–10.5)
nRBC: 0 % (ref 0.0–0.2)

## 2023-01-24 LAB — COMPREHENSIVE METABOLIC PANEL
ALT: 19 U/L (ref 0–44)
AST: 22 U/L (ref 15–41)
Albumin: 3.4 g/dL — ABNORMAL LOW (ref 3.5–5.0)
Alkaline Phosphatase: 65 U/L (ref 38–126)
Anion gap: 6 (ref 5–15)
BUN: 27 mg/dL — ABNORMAL HIGH (ref 8–23)
CO2: 22 mmol/L (ref 22–32)
Calcium: 8.6 mg/dL — ABNORMAL LOW (ref 8.9–10.3)
Chloride: 107 mmol/L (ref 98–111)
Creatinine, Ser: 1.52 mg/dL — ABNORMAL HIGH (ref 0.61–1.24)
GFR, Estimated: 47 mL/min — ABNORMAL LOW (ref >60)
Glucose, Bld: 114 mg/dL — ABNORMAL HIGH (ref 70–99)
Potassium: 4.2 mmol/L (ref 3.5–5.1)
Sodium: 135 mmol/L (ref 135–145)
Total Bilirubin: 0.7 mg/dL (ref 0.0–1.2)
Total Protein: 6.7 g/dL (ref 6.5–8.1)

## 2023-01-24 LAB — MAGNESIUM: Magnesium: 2.1 mg/dL (ref 1.7–2.4)

## 2023-01-24 MED ORDER — HEPARIN SOD (PORK) LOCK FLUSH 100 UNIT/ML IV SOLN
500.0000 [IU] | Freq: Once | INTRAVENOUS | Status: AC | PRN
  Administered 2023-01-24: 500 [IU]

## 2023-01-24 MED ORDER — SODIUM CHLORIDE 0.9 % IV SOLN: Freq: Once | INTRAVENOUS | Status: AC

## 2023-01-24 MED ORDER — SODIUM CHLORIDE 0.9% FLUSH
10.0000 mL | INTRAVENOUS | Status: DC | PRN
Start: 2023-01-24 — End: 2023-01-24
  Administered 2023-01-24 (×2): 10 mL

## 2023-01-24 MED ORDER — PEMBROLIZUMAB CHEMO INJECTION 100 MG/4ML
200.0000 mg | Freq: Once | INTRAVENOUS | Status: AC
  Administered 2023-01-24: 200 mg via INTRAVENOUS
  Filled 2023-01-24: qty 8

## 2023-01-24 NOTE — Progress Notes (Signed)
Patient presents today for Keytruda infusion.  Patient is in satisfactory condition with no new complaints voiced.  Vital signs are stable.  Labs reviewed and all labs are within treatment parameters.  We will proceed with treatment per MD orders.

## 2023-01-24 NOTE — Progress Notes (Signed)
 Patient tolerated therapy with no complaints voiced.  Side effects with management reviewed with understanding verbalized.  Port site clean and dry with no bruising or swelling noted at site.  Good blood return noted before and after administration of therapy.  Band aid applied.  Patient left in satisfactory condition with VSS and no s/s of distress noted.

## 2023-01-24 NOTE — Patient Instructions (Signed)
 [facility] [place] [facility]  Discharge Instructions: Thank you for choosing [facility] to provide your oncology and hematology care.  If you have a lab appointment with the Cancer Center - please note that after April 8th, 2024, all labs will be drawn in the cancer center.  You do not have to check in or register with the main entrance as you have in the past but will complete your check-in in the cancer center.  Wear comfortable clothing and clothing appropriate for easy access to any Portacath or PICC line.   We strive to give you quality time with your provider. You may need to reschedule your appointment if you arrive late (15 or more minutes).  Arriving late affects you and other patients whose appointments are after yours.  Also, if you miss three or more appointments without notifying the office, you may be dismissed from the clinic at the provider's discretion.      For prescription refill requests, have your pharmacy contact our office and allow 72 hours for refills to be completed.    Today you received the following chemotherapy and/or immunotherapy agents Keytruda, return as scheduled.   To help prevent nausea and vomiting after your treatment, we encourage you to take your nausea medication as directed.  BELOW ARE SYMPTOMS THAT SHOULD BE REPORTED IMMEDIATELY: *FEVER GREATER THAN 100.4 F (38 C) OR HIGHER *CHILLS OR SWEATING *NAUSEA AND VOMITING THAT IS NOT CONTROLLED WITH YOUR NAUSEA MEDICATION *UNUSUAL SHORTNESS OF BREATH *UNUSUAL BRUISING OR BLEEDING *URINARY PROBLEMS (pain or burning when urinating, or frequent urination) *BOWEL PROBLEMS (unusual diarrhea, constipation, pain near the anus) TENDERNESS IN MOUTH AND THROAT WITH OR WITHOUT PRESENCE OF ULCERS (sore throat, sores in mouth, or a toothache) UNUSUAL RASH, SWELLING OR PAIN  UNUSUAL VAGINAL DISCHARGE OR ITCHING   Items with * indicate a potential emergency and  should be followed up as soon as possible or go to the Emergency Department if any problems should occur.  Please show the CHEMOTHERAPY ALERT CARD or IMMUNOTHERAPY ALERT CARD at check-in to the Emergency Department and triage nurse.  Should you have questions after your visit or need to cancel or reschedule your appointment, please contact [facility]CER CTR [place] - A DEPT OF [facility] [phone]  and follow the prompts.  Office hours are 8:00 a.m. to 4:30 p.m. Monday - Friday. Please note that voicemails left after 4:00 p.m. may not be returned until the following business day.  We are closed weekends and major holidays. You have access to a nurse at all times for urgent questions. Please call the main number to the clinic [phone] and follow the prompts.  For any non-urgent questions, you may also contact your provider using MyChart. We now offer e-Visits for anyone 58 and older to request care online for non-urgent symptoms. For details visit [email or web address].   Also download the MyChart app! Go to the app store, search "MyChart", open the app, select [place], and log in with your MyChart username and password.

## 2023-02-02 IMAGING — DX DG CHEST 1V PORT
1 series · 1 of 1 positions shown · non-contrast
Comparison: CT chest dated June 08, 2021. Chest x-ray dated February 04, 2020.

CLINICAL DATA: Status post right upper lobe bronchoscopy and
biopsy.

EXAM:
PORTABLE CHEST 1 VIEW

[chest ap]
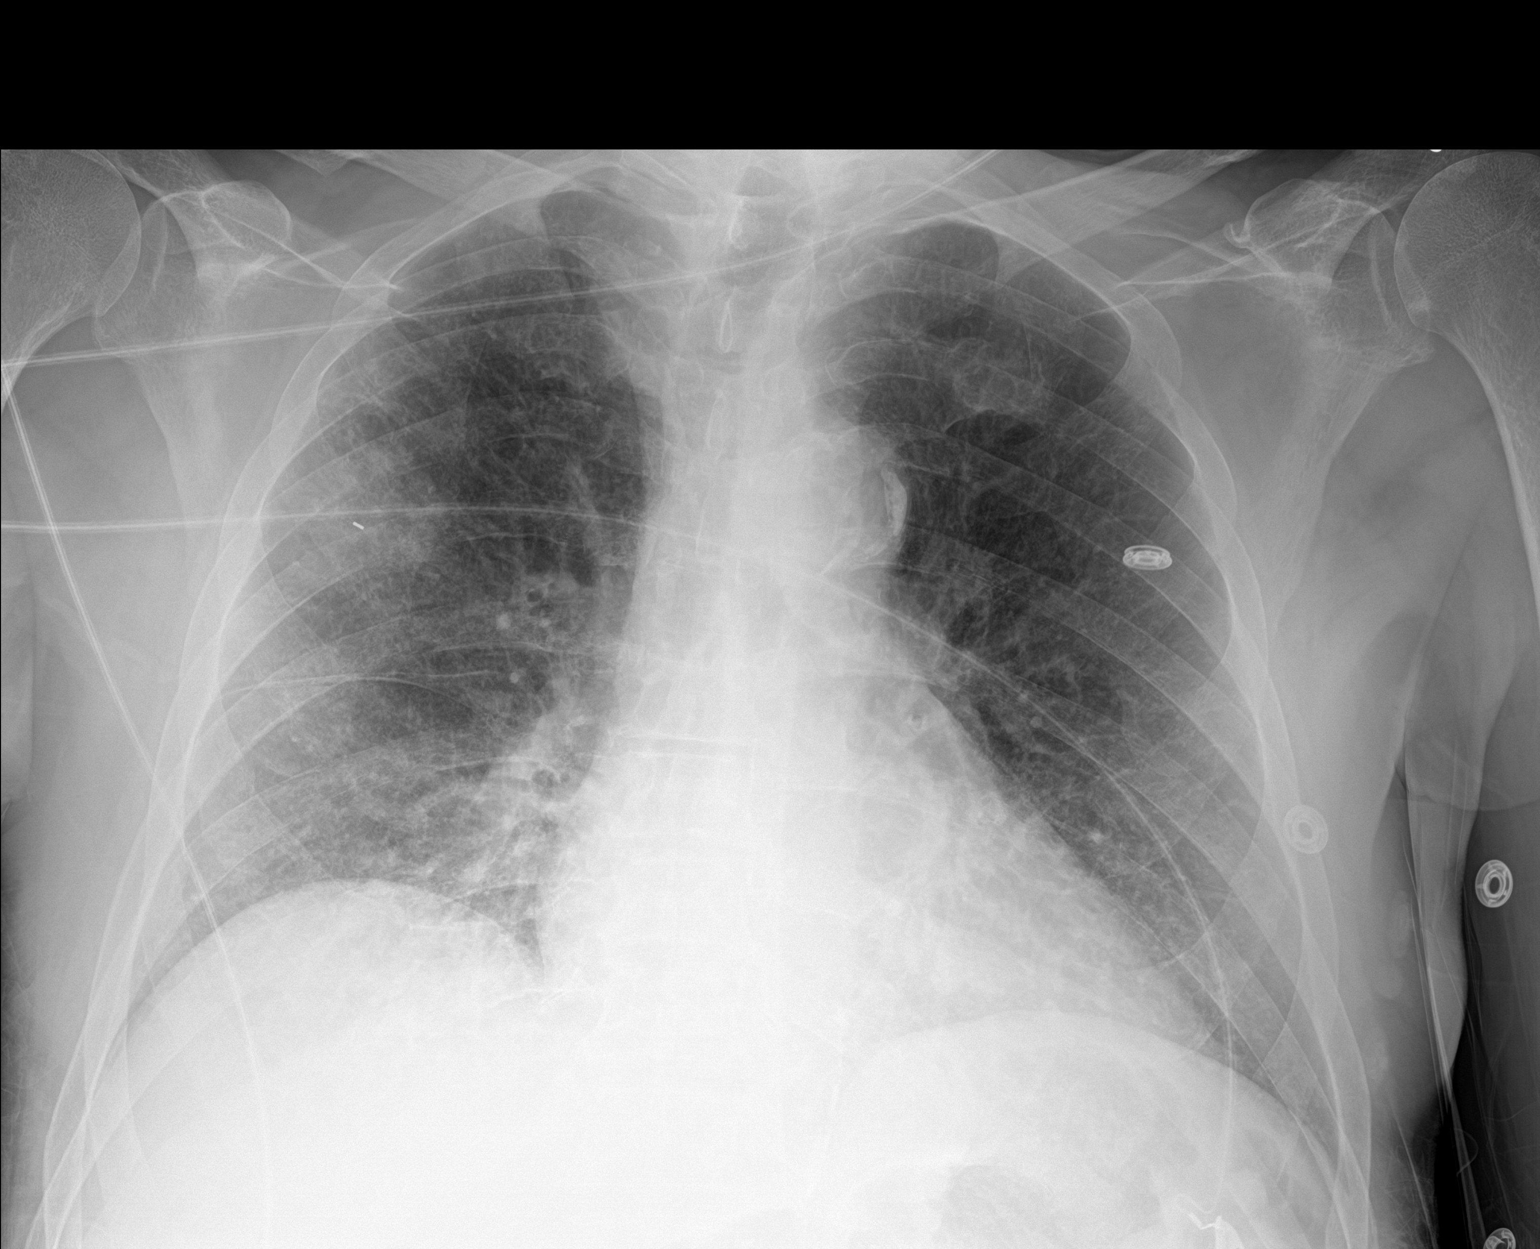

[1 of 1 positions shown; findings below may reference images not displayed]

FINDINGS: The heart size and mediastinal contours are within normal limits.
Normal pulmonary vascularity. Increased rounded density in the
peripheral right upper lobe centered on a new fiducial marker.
Emphysematous changes again noted. No pleural effusion or
pneumothorax. No acute osseous abnormality.
IMPRESSION: 1. Increased rounded density in the peripheral right upper lobe
centered on a new fiducial marker, likely a small amount of post
biopsy hemorrhage. No pneumothorax.

## 2023-02-03 ENCOUNTER — Other Ambulatory Visit: Payer: Self-pay

## 2023-02-03 ENCOUNTER — Encounter: Payer: Self-pay | Admitting: Hematology

## 2023-02-03 ENCOUNTER — Other Ambulatory Visit: Payer: Self-pay | Admitting: Family Medicine

## 2023-02-04 ENCOUNTER — Encounter: Payer: Self-pay | Admitting: Hematology

## 2023-02-04 ENCOUNTER — Other Ambulatory Visit: Payer: Self-pay

## 2023-02-04 ENCOUNTER — Other Ambulatory Visit (HOSPITAL_COMMUNITY): Payer: Self-pay

## 2023-02-04 MED ORDER — METOPROLOL SUCCINATE ER 25 MG PO TB24
25.0000 mg | ORAL_TABLET | Freq: Every day | ORAL | 3 refills | Status: DC
  Filled 2023-02-04: qty 90, 90d supply, fill #0
  Filled 2023-04-21: qty 90, 90d supply, fill #1
  Filled 2023-09-16: qty 90, 90d supply, fill #2

## 2023-02-13 ENCOUNTER — Inpatient Hospital Stay: Payer: HMO | Admitting: Hematology

## 2023-02-13 ENCOUNTER — Inpatient Hospital Stay: Payer: HMO

## 2023-02-13 ENCOUNTER — Inpatient Hospital Stay (HOSPITAL_BASED_OUTPATIENT_CLINIC_OR_DEPARTMENT_OTHER): Payer: HMO

## 2023-02-13 VITALS — Ht 70.0 in | Wt 186.0 lb

## 2023-02-13 VITALS — BP 132/83 | HR 66 | Temp 98.6°F | Resp 16

## 2023-02-13 VITALS — BP 106/65 | HR 68 | Temp 98.6°F | Resp 16

## 2023-02-13 DIAGNOSIS — Z2989 Encounter for other specified prophylactic measures: Secondary | ICD-10-CM | POA: Diagnosis not present

## 2023-02-13 DIAGNOSIS — Z5112 Encounter for antineoplastic immunotherapy: Secondary | ICD-10-CM | POA: Diagnosis not present

## 2023-02-13 DIAGNOSIS — C3411 Malignant neoplasm of upper lobe, right bronchus or lung: Secondary | ICD-10-CM

## 2023-02-13 DIAGNOSIS — Z95828 Presence of other vascular implants and grafts: Secondary | ICD-10-CM

## 2023-02-13 LAB — CBC WITH DIFFERENTIAL/PLATELET
Abs Immature Granulocytes: 0.01 10*3/uL (ref 0.00–0.07)
Basophils Absolute: 0.1 10*3/uL (ref 0.0–0.1)
Basophils Relative: 1 %
Eosinophils Absolute: 0.3 10*3/uL (ref 0.0–0.5)
Eosinophils Relative: 4 %
HCT: 36.6 % — ABNORMAL LOW (ref 39.0–52.0)
Hemoglobin: 12.2 g/dL — ABNORMAL LOW (ref 13.0–17.0)
Immature Granulocytes: 0 %
Lymphocytes Relative: 18 %
Lymphs Abs: 1.2 10*3/uL (ref 0.7–4.0)
MCH: 31.7 pg (ref 26.0–34.0)
MCHC: 33.3 g/dL (ref 30.0–36.0)
MCV: 95.1 fL (ref 80.0–100.0)
Monocytes Absolute: 0.4 10*3/uL (ref 0.1–1.0)
Monocytes Relative: 6 %
Neutro Abs: 4.7 10*3/uL (ref 1.7–7.7)
Neutrophils Relative %: 71 %
Platelets: 131 10*3/uL — ABNORMAL LOW (ref 150–400)
RBC: 3.85 MIL/uL — ABNORMAL LOW (ref 4.22–5.81)
RDW: 13.3 % (ref 11.5–15.5)
WBC: 6.6 10*3/uL (ref 4.0–10.5)
nRBC: 0 % (ref 0.0–0.2)

## 2023-02-13 LAB — COMPREHENSIVE METABOLIC PANEL
ALT: 13 U/L (ref 0–44)
AST: 19 U/L (ref 15–41)
Albumin: 3.6 g/dL (ref 3.5–5.0)
Alkaline Phosphatase: 60 U/L (ref 38–126)
Anion gap: 7 (ref 5–15)
BUN: 23 mg/dL (ref 8–23)
CO2: 23 mmol/L (ref 22–32)
Calcium: 8.9 mg/dL (ref 8.9–10.3)
Chloride: 109 mmol/L (ref 98–111)
Creatinine, Ser: 1.36 mg/dL — ABNORMAL HIGH (ref 0.61–1.24)
GFR, Estimated: 54 mL/min — ABNORMAL LOW (ref >60)
Glucose, Bld: 132 mg/dL — ABNORMAL HIGH (ref 70–99)
Potassium: 4.1 mmol/L (ref 3.5–5.1)
Sodium: 139 mmol/L (ref 135–145)
Total Bilirubin: 0.6 mg/dL (ref 0.0–1.2)
Total Protein: 6.7 g/dL (ref 6.5–8.1)

## 2023-02-13 LAB — MAGNESIUM: Magnesium: 1.7 mg/dL (ref 1.7–2.4)

## 2023-02-13 LAB — TSH: TSH: 1.519 u[IU]/mL (ref 0.350–4.500)

## 2023-02-13 MED ORDER — SODIUM CHLORIDE 0.9 % IV SOLN: Freq: Once | INTRAVENOUS | Status: AC

## 2023-02-13 MED ORDER — SODIUM CHLORIDE 0.9% FLUSH
10.0000 mL | INTRAVENOUS | Status: DC | PRN
  Administered 2023-02-13: 10 mL

## 2023-02-13 MED ORDER — HEPARIN SOD (PORK) LOCK FLUSH 100 UNIT/ML IV SOLN
500.0000 [IU] | Freq: Once | INTRAVENOUS | Status: AC | PRN
  Administered 2023-02-13: 500 [IU]

## 2023-02-13 MED ORDER — SODIUM CHLORIDE 0.9 % IV SOLN
200.0000 mg | Freq: Once | INTRAVENOUS | Status: AC
  Administered 2023-02-13: 200 mg via INTRAVENOUS
  Filled 2023-02-13: qty 8

## 2023-02-13 MED ORDER — SODIUM CHLORIDE FLUSH 0.9 % IV SOLN
10.0000 mL | Freq: Once | INTRAVENOUS | Status: AC
  Administered 2023-02-13: 10 mL via INTRAVENOUS
  Filled 2023-02-13: qty 10

## 2023-02-13 NOTE — Progress Notes (Signed)
Patient has been examined by Dr. Katragadda. Vital signs and labs have been reviewed by MD - ANC, Creatinine, LFTs, hemoglobin, and platelets are within treatment parameters per M.D. - pt may proceed with treatment.  Primary RN and pharmacy notified.  

## 2023-02-13 NOTE — Progress Notes (Signed)
Patient presents today for chemotherapy infusion. Patient is in satisfactory condition with no new complaints voiced.  Vital signs are stable.  Labs reviewed by Dr. Katragadda during the office visit and all labs are within treatment parameters.  We will proceed with treatment per MD orders.  

## 2023-02-13 NOTE — Patient Instructions (Signed)
 [facility] [place] [facility]  Discharge Instructions: Thank you for choosing [facility] to provide your oncology and hematology care.  If you have a lab appointment with the Cancer Center - please note that after April 8th, 2024, all labs will be drawn in the cancer center.  You do not have to check in or register with the main entrance as you have in the past but will complete your check-in in the cancer center.  Wear comfortable clothing and clothing appropriate for easy access to any Portacath or PICC line.   We strive to give you quality time with your provider. You may need to reschedule your appointment if you arrive late (15 or more minutes).  Arriving late affects you and other patients whose appointments are after yours.  Also, if you miss three or more appointments without notifying the office, you may be dismissed from the clinic at the provider's discretion.      For prescription refill requests, have your pharmacy contact our office and allow 72 hours for refills to be completed.    Today you received the following chemotherapy and/or immunotherapy agents Darryl Ward   To help prevent nausea and vomiting after your treatment, we encourage you to take your nausea medication as directed.  BELOW ARE SYMPTOMS THAT SHOULD BE REPORTED IMMEDIATELY: *FEVER GREATER THAN 100.4 F (38 C) OR HIGHER *CHILLS OR SWEATING *NAUSEA AND VOMITING THAT IS NOT CONTROLLED WITH YOUR NAUSEA MEDICATION *UNUSUAL SHORTNESS OF BREATH *UNUSUAL BRUISING OR BLEEDING *URINARY PROBLEMS (pain or burning when urinating, or frequent urination) *BOWEL PROBLEMS (unusual diarrhea, constipation, pain near the anus) TENDERNESS IN MOUTH AND THROAT WITH OR WITHOUT PRESENCE OF ULCERS (sore throat, sores in mouth, or a toothache) UNUSUAL RASH, SWELLING OR PAIN  UNUSUAL VAGINAL DISCHARGE OR ITCHING   Items with * indicate a potential emergency and should be followed up as  soon as possible or go to the Emergency Department if any problems should occur.  Please show the CHEMOTHERAPY ALERT CARD or IMMUNOTHERAPY ALERT CARD at check-in to the Emergency Department and triage nurse.  Should you have questions after your visit or need to cancel or reschedule your appointment, please contact [facility]CER CTR [place] - A DEPT OF [facility] [phone]  and follow the prompts.  Office hours are 8:00 a.m. to 4:30 p.m. Monday - Friday. Please note that voicemails left after 4:00 p.m. may not be returned until the following business day.  We are closed weekends and major holidays. You have access to a nurse at all times for urgent questions. Please call the main number to the clinic [phone] and follow the prompts.  For any non-urgent questions, you may also contact your provider using MyChart. We now offer e-Visits for anyone 28 and older to request care online for non-urgent symptoms. For details visit [email or web address].   Also download the MyChart app! Go to the app store, search "MyChart", open the app, select [place], and log in with your MyChart username and password.

## 2023-02-13 NOTE — Progress Notes (Signed)

## 2023-02-13 NOTE — Progress Notes (Signed)
[facility] [address], [place] [address]    Clinic Day:  02/20/23  Referring physician: [name], DO  Patient Care Team: [name], DO as PCP - General (Family Medicine) [name], MD as PCP - Cardiology (Cardiology) [name], MD as Consulting Physician (Gastroenterology) [name], RN as Oncology Nurse Navigator (Medical Oncology) [name], MD as Medical Oncologist (Medical Oncology) [name], MD as Consulting Physician (Hematology)   ASSESSMENT & PLAN:   Assessment: 1. 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  Occasional additional small pulmonary nodules measuring 0.4 cm and smaller. - Bronchoscopy (06/11/2021): RUL nodule brushing and FNA: Malignant cells with features of adenocarcinoma. - PET scan (06/22/2021): Subsolid nodular lesion in the right upper lobe, hypermetabolic SUV 15.  Small right hilar and infrahilar lymph nodes with SUV 4.03 concerning for metastatic adenopathy.  Small hypermetabolic left parotid gland lesion, consistent with previous history of Wharton's tumor.  Hypermetabolic focus in the transverse colon SUV 8.69. - MRI of the brain (07/11/2021): No evidence of metastatic disease.  Right sphenoid wing meningioma stable since 2022. - He was evaluated by Dr. Dorris Fetch and was recommended to undergo neoadjuvant chemoimmunotherapy followed by surgical resection. - 3 cycles of carboplatin, pemetrexed from 08/06/2021 through 10/02/2021 (opdivo given with only cycle 2, discontinued for cycle 3 due to cardiac problems) - NGS testing not performed due to insufficient sample. - Guardant360: Negative for EGFR and ALK.  MSI high not detected. - Right upper lobectomy and lymph node dissection (11/12/2021): 1.2 cm invasive adenocarcinoma, moderately differentiated, margins  negative, pT1b pN0, 0/17 lymph nodes positive from stations 4R, 7, 8, 9, 10R, 11 R and 12 R.  Negative for visceral pleural involvement, LVI.  PD-L1 TPS is 1%. - Adjuvant pembrolizumab started on 03/11/2022.  Pembrolizumab held after 3 cycles due to fall and right fracture, s/p right hemiarthroplasty.   2. Social/family history: - Lives at home with his wife.  Uses cane occasionally after he had stroke in January 2022.  He has retired after working in [employer].  He is current active smoker, 1 pack/day for 60 years.  No exposure to chemicals. - Brother died of metastatic cancer.  Maternal uncle had lung cancer.    Plan: 1. 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  No lymphadenopathy.  Other benign findings were discussed. - He is tolerating adjuvant Keytruda reasonably well.  According to the wife, he loses balance and falls at least once a week.  He does not follow when he uses the walker. - Reviewed labs from 02/13/2023: Normal LFTs.  Creatinine stable at 1.36.  TSH is 1.5.  This has improved from last value of 0.236. - Proceed with cycle 13 of Keytruda today and next cycle in 3 weeks.  RTC 6 weeks for follow-up.  2.  Normocytic anemia: - Last ferritin on 06/13/2022.  Hemoglobin today is 12.2.   3.  Hypertension/A-fib: - Continue metoprolol 25 mg daily.  Blood pressure is 106/65 pulse rate is 68.   4.  Depression: - He will continue Lexapro 10 mg daily.    Orders Placed This Encounter  Procedures   Magnesium    Standing Status:   Future    Expected Date:   05/06/2023    Expiration Date:  05/05/2024   CBC with Differential    Standing Status:   Future    Expected Date:   05/06/2023    Expiration Date:   05/05/2024   Comprehensive metabolic panel    Standing Status:   Future    Expected Date:   05/06/2023    Expiration Date:   05/05/2024   T4    Standing Status:   Future    Expected Date:   05/06/2023    Expiration  Date:   05/05/2024   TSH    Standing Status:   Future    Expected Date:   05/06/2023    Expiration Date:   05/05/2024   TSH     I,Helena R Teague,acting as a scribe for [name], MD.,have documented all relevant documentation on the behalf of [name], MD,as directed by  [name], MD while in the presence of [name], MD.  I, [name] MD, have reviewed the above documentation for accuracy and completeness, and I agree with the above.    [name], MD   1/30/20259:48 AM  CHIEF COMPLAINT:   Diagnosis: right upper lobe lung adenocarcinoma    Cancer Staging  Primary adenocarcinoma of upper lobe of right lung [facility]) Staging form: Lung, AJCC 8th Edition - Clinical stage from 06/26/2021: cT1, cN1, cM0 - Unsigned    Prior Therapy: 1. 3 cycles Neoadjuvant chemoimmunotherapy with carboplatin, pemetrexed from 08/06/21 through 10/02/21 2. RUL lobectomy and LND on 10/23/203  Current Therapy:  Adjuvant pembrolizumab    HISTORY OF PRESENT ILLNESS:   Oncology History  Primary adenocarcinoma of upper lobe of right lung (HCC)  06/26/2021 Initial Diagnosis   Primary adenocarcinoma of upper lobe of right lung (HCC)   08/06/2021 - 08/27/2021 Chemotherapy   Patient is on Treatment Plan : LUNG NSCLC Pemetrexed + Carboplatin q21d x 4 Cycles     08/06/2021 - 10/02/2021 Chemotherapy   Patient is on Treatment Plan : LUNG NSCLC Pemetrexed + Carboplatin q21d x 4 Cycles     03/11/2022 -  Chemotherapy   Patient is on Treatment Plan : LUNG NSCLC Pembrolizumab (200) q21d        INTERVAL HISTORY:   Darryl Ward is a 78 y.o. male seen for follow-up of right upper lobe lung adenocarcinoma. He was last seen by me on 01/01/23.  Today, he states that he is doing well overall. His appetite level is at 100%. His energy level is at 50%. He is accompanied by his wife.   He reports a normal appetite. His wife notes he falls at least once a week. He is not  using his cane or walker when these falls occur. He has not fallen when using his walker, though he has fallen while using his cane. He reports falls occur by losing balance. He denies any dizziness during his falls. He reports occasional numbness in the feet. He is tolerating treatment well and denies any side effects including cough, worsened SOB, or diarrhea. He is taking Lexapro as prescribed.   PAST MEDICAL HISTORY:   Past Medical History: Past Medical History:  Diagnosis Date   AAA (abdominal aortic aneurysm) (HCC)    Arthritis    Cancer (HCC)    Carotid artery disease (HCC)    Nonobstructive   Cataract    COPD (chronic obstructive pulmonary disease) (HCC)    Coronary atherosclerosis of native coronary artery    PTCA small diagonal 2007 otherwise nonobstructive CAD   Depression    Dysrhythmia    Essential hypertension,  benign    Hyperlipidemia    NSTEMI (non-ST elevated myocardial infarction) (HCC) 2007   Stroke [name]) 2004   TIA (transient ischemic attack) 2006    Surgical History: Past Surgical History:  Procedure Laterality Date   AORTA - BILATERAL FEMORAL ARTERY BYPASS GRAFT  01/08/2012   Procedure: AORTA BIFEMORAL BYPASS GRAFT;  Surgeon: Sherren Kerns, MD;  Location: [facility] OR;  Service: Vascular;  Laterality: Bilateral;  using 18x50mm x 40cm Hemashield Gold Vascular Graft with Endarterectomy, Thombectomy and  Reimplantation of Inferior Mesenteric Artery   BACK SURGERY  2021   BRONCHIAL BIOPSY  06/11/2021   Procedure: BRONCHIAL BIOPSIES;  Surgeon: Leslye Peer, MD;  Location: [facility] ENDOSCOPY;  Service: Pulmonary;;   BRONCHIAL BRUSHINGS  06/11/2021   Procedure: BRONCHIAL BRUSHINGS;  Surgeon: Leslye Peer, MD;  Location: [facility] ENDOSCOPY;  Service: Pulmonary;;   BRONCHIAL NEEDLE ASPIRATION BIOPSY  06/11/2021   Procedure: BRONCHIAL NEEDLE ASPIRATION BIOPSIES;  Surgeon: Leslye Peer, MD;  Location: [facility] ENDOSCOPY;  Service: Pulmonary;;   CARDIOVERSION N/A 09/20/2021    Procedure: CARDIOVERSION;  Surgeon: [name], MD;  Location: [facility] ORS;  Service: Cardiovascular;  Laterality: N/A;   COLONOSCOPY N/A 04/14/2019   Procedure: COLONOSCOPY;  Surgeon: Corbin Ade, MD;  Location: [facility] ENDO SUITE;  Service: Endoscopy;  Laterality: N/A;  9:30   COLONOSCOPY WITH PROPOFOL N/A 07/25/2021   Procedure: COLONOSCOPY WITH PROPOFOL;  Surgeon: Lanelle Bal, DO;  Location: [facility] ENDO SUITE;  Service: Endoscopy;  Laterality: N/A;  1:00pm   FIDUCIAL MARKER PLACEMENT  06/11/2021   Procedure: FIDUCIAL MARKER PLACEMENT;  Surgeon: Leslye Peer, MD;  Location: [facility] ENDOSCOPY;  Service: Pulmonary;;   HIP ARTHROPLASTY Right 04/28/2022   Procedure: ARTHROPLASTY BIPOLAR HIP (HEMIARTHROPLASTY);  Surgeon: Sheral Apley, MD;  Location: [facility] OR;  Service: Orthopedics;  Laterality: Right;   INTERCOSTAL NERVE BLOCK Right 11/12/2021   Procedure: INTERCOSTAL NERVE BLOCK;  Surgeon: Loreli Slot, MD;  Location: [facility] OR;  Service: Thoracic;  Laterality: Right;   IR IMAGING GUIDED PORT INSERTION  07/31/2021   Left cataract surgery     LYMPH NODE DISSECTION Right 11/12/2021   Procedure: LYMPH NODE DISSECTION;  Surgeon: Loreli Slot, MD;  Location: [facility] OR;  Service: Thoracic;  Laterality: Right;   POLYPECTOMY  04/14/2019   Procedure: POLYPECTOMY;  Surgeon: Corbin Ade, MD;  Location: [facility] ENDO SUITE;  Service: Endoscopy;;   POLYPECTOMY  07/25/2021   Procedure: POLYPECTOMY;  Surgeon: Lanelle Bal, DO;  Location: [facility] ENDO SUITE;  Service: Endoscopy;;   TEE WITHOUT CARDIOVERSION N/A 09/20/2021   Procedure: TRANSESOPHAGEAL ECHOCARDIOGRAM (TEE);  Surgeon: [name], MD;  Location: [facility] ORS;  Service: Cardiovascular;  Laterality: N/A;   TRANSFORAMINAL LUMBAR INTERBODY FUSION (TLIF) WITH PEDICLE SCREW FIXATION 1 LEVEL N/A 04/27/2020   Procedure: Transforaminal Lumbar Interbody Fusion Lumbar Five-Sacral One;  Surgeon: Bedelia Person, MD;  Location: [facility] OR;  Service:  Neurosurgery;  Laterality: N/A;   VIDEO BRONCHOSCOPY WITH INSERTION OF INTERBRONCHIAL VALVE (IBV) N/A 11/22/2021   Procedure: VIDEO BRONCHOSCOPY WITH INSERTION OF INTERBRONCHIAL VALVE (IBV);  Surgeon: Loreli Slot, MD;  Location: [facility] OR;  Service: Thoracic;  Laterality: N/A;   VIDEO BRONCHOSCOPY WITH INSERTION OF INTERBRONCHIAL VALVE (IBV) N/A 01/24/2022   Procedure: VIDEO BRONCHOSCOPY WITH REMOVAL OF INTERBRONCHIAL VALVE (IBV);  Surgeon: Loreli Slot, MD;  Location: [facility] OR;  Service: Thoracic;  Laterality: N/A;   VIDEO BRONCHOSCOPY WITH RADIAL ENDOBRONCHIAL ULTRASOUND  06/11/2021   Procedure: VIDEO BRONCHOSCOPY WITH  RADIAL ENDOBRONCHIAL ULTRASOUND;  Surgeon: Leslye Peer, MD;  Location[facility] ENDOSCOPY;  Service: Pulmonary;;    Social History: Social History   Socioeconomic History   Marital status: Divorced    Spouse name: Not on file   Number of children: 1   Years of education: 11   Highest education level: 11th grade  Occupational History    Employer: Engineer, materials  Tobacco Use   Smoking status: Former    Current packs/day: 0.00    Average packs/day: 1 pack/day for 40.0 years (40.0 ttl pk-yrs)    Types: Cigarettes    Start date: 07/1981    Quit date: 07/2021    Years since quitting: 1.5   Smokeless tobacco: Never   Tobacco comments:    1 pack of cigarettes smoked daily. 07/17/21 ARJ, RN   Vaping Use   Vaping status: Never Used  Substance and Sexual Activity   Alcohol use: No    Comment: Prior history of regular alcohol use   Drug use: No   Sexual activity: Not Currently  Other Topics Concern   Not on file  Social History Narrative   Not on file   Social Drivers of Health   Financial Resource Strain: High Risk (08/30/2022)   Overall Financial Resource Strain (CARDIA)    Difficulty of Paying Living Expenses: Hard  Food Insecurity: Food Insecurity Present (09/13/2022)   Hunger Vital Sign    Worried About Running Out of Food in the Last Year: Never  true    Ran Out of Food in the Last Year: Sometimes true  Transportation Needs: No Transportation Needs (09/13/2022)   PRAPARE - Administrator, Civil Service (Medical): No    Lack of Transportation (Non-Medical): No  Physical Activity: Insufficiently Active (08/30/2022)   Exercise Vital Sign    Days of Exercise per Week: 7 days    Minutes of Exercise per Session: 20 min  Stress: No Stress Concern Present (08/30/2022)   [employer] of Occupational Health - Occupational Stress Questionnaire    Feeling of Stress : Not at all  Social Connections: Socially Isolated (08/30/2022)   Social Connection and Isolation Panel [NHANES]    Frequency of Communication with Friends and Family: Twice a week    Frequency of Social Gatherings with Friends and Family: Twice a week    Attends Religious Services: Never    Database administrator or Organizations: No    Attends Banker Meetings: Never    Marital Status: Divorced  Catering manager Violence: Not At Risk (08/30/2022)   Humiliation, Afraid, Rape, and Kick questionnaire    Fear of Current or Ex-Partner: No    Emotionally Abused: No    Physically Abused: No    Sexually Abused: No    Family History: Family History  Problem Relation Age of Onset   Hyperlipidemia Sister    Heart attack Brother 21   Cancer - Colon Neg Hx     Current Medications:  Current Outpatient Medications:    acetaminophen (TYLENOL) 500 MG tablet, GIVE 2 TABLETS (1,000 mg TOTAL) BY MOUTH EVERY 6 HOURS AS NEEDED FOR PAIN, Disp: 60 tablet, Rfl: 0   albuterol (VENTOLIN HFA) 108 (90 Base) MCG/ACT inhaler, Inhale 2 puffs into the lungs every 6 (six) hours as needed for wheezing or shortness of breath., Disp: 6.7 g, Rfl: 6   apixaban (ELIQUIS) 5 MG TABS tablet, Take 1 tablet (5 mg total) by mouth 2 (two) times daily., Disp: 180 tablet, Rfl: 3  atorvastatin (LIPITOR) 80 MG tablet, Take 1 tablet (80 mg total) by mouth daily., Disp: 90 tablet, Rfl: 3    chlorthalidone (HYGROTON) 25 MG tablet, 1 tablet   every other day Orally Once a day, Disp: , Rfl:    cyclobenzaprine (FLEXERIL) 10 MG tablet, Take 1 tablet (10 mg total) by mouth 2 (two) times daily as needed., Disp: 60 tablet, Rfl: 2   escitalopram (LEXAPRO) 10 MG tablet, Take 1 tablet (10 mg total) by mouth daily., Disp: 90 tablet, Rfl: 3   folic acid (KP FOLIC ACID) 1 MG tablet, Take 1 tablet (1 mg total) by mouth daily., Disp: 90 tablet, Rfl: 3   furosemide (LASIX) 20 MG tablet, Take 1 tablet (20 mg total) by mouth daily as needed., Disp: 90 tablet, Rfl: 2   HYDROcodone-acetaminophen (NORCO/VICODIN) 5-325 MG tablet, Take 1 tablet by mouth every 6 (six) hours as needed, for pain., Disp: 60 tablet, Rfl: 0   magnesium oxide (MAG-OX) 400 (240 Mg) MG tablet, Take 1 tablet (400 mg total) by mouth daily., Disp: 120 tablet, Rfl: 3   metoprolol succinate (TOPROL-XL) 25 MG 24 hr tablet, Take 1 tablet (25 mg total) by mouth daily., Disp: 90 tablet, Rfl: 3   pantoprazole (PROTONIX) 40 MG tablet, Take 1 tablet (40 mg total) by mouth daily., Disp: 90 tablet, Rfl: 3   PEMBROLIZUMAB IV, Inject 200 mg into the vein every 21 ( twenty-one) days., Disp: , Rfl:    tamsulosin (FLOMAX) 0.4 MG CAPS capsule, Take 1 capsule (0.4 mg total) by mouth daily., Disp: 90 capsule, Rfl: 3   Tiotropium Bromide Monohydrate (SPIRIVA RESPIMAT) 2.5 MCG/ACT AERS, Inhale 2 puffs into the lungs daily., Disp: 4 g, Rfl: 3 No current facility-administered medications for this visit.  Facility-Administered Medications Ordered in Other Visits:    sodium chloride flush (NS) 0.9 % injection 10 mL, 10 mL, Intracatheter, PRN, [name], MD, 10 mL at 07/10/22 1513   Allergies: No Known Allergies  REVIEW OF SYSTEMS:   Review of Systems  Constitutional:  Negative for chills, fatigue and fever.  HENT:   Negative for lump/mass, mouth sores, nosebleeds, sore throat and trouble swallowing.   Eyes:  Negative for eye problems.   Respiratory:  Negative for cough and shortness of breath.   Cardiovascular:  Negative for chest pain, leg swelling and palpitations.  Gastrointestinal:  Negative for abdominal pain, constipation, diarrhea, nausea and vomiting.  Genitourinary:  Negative for bladder incontinence, difficulty urinating, dysuria, frequency, hematuria and nocturia.   Musculoskeletal:  Negative for arthralgias, back pain, flank pain, myalgias and neck pain.  Skin:  Negative for itching and rash.  Neurological:  Negative for dizziness, headaches and numbness.  Hematological:  Does not bruise/bleed easily.  Psychiatric/Behavioral:  Negative for depression, sleep disturbance and suicidal ideas. The patient is not nervous/anxious.   All other systems reviewed and are negative.    VITALS:   Height 5\' 10"  (1.778 m), weight 186 lb (84.4 kg).  Wt Readings from Last 3 Encounters:  02/13/23 186 lb (84.4 kg)  01/24/23 191 lb 6.4 oz (86.8 kg)  01/01/23 187 lb 14.4 oz (85.2 kg)    Body mass index is 26.69 kg/m.  Performance status (ECOG): 1 - Symptomatic but completely ambulatory  PHYSICAL EXAM:   Physical Exam Vitals and nursing note reviewed. Exam conducted with a chaperone present.  Constitutional:      Appearance: Normal appearance.  Cardiovascular:     Rate and Rhythm: Normal rate and regular rhythm.  Pulses: Normal pulses.     Heart sounds: Normal heart sounds.  Pulmonary:     Effort: Pulmonary effort is normal.     Breath sounds: Normal breath sounds.  Abdominal:     Palpations: Abdomen is soft. There is no hepatomegaly, splenomegaly or mass.     Tenderness: There is no abdominal tenderness.  Musculoskeletal:     Right lower leg: No edema.     Left lower leg: No edema.  Lymphadenopathy:     Cervical: No cervical adenopathy.     Right cervical: No superficial, deep or posterior cervical adenopathy.    Left cervical: No superficial, deep or posterior cervical adenopathy.     Upper Body:      Right upper body: No supraclavicular or axillary adenopathy.     Left upper body: No supraclavicular or axillary adenopathy.  Neurological:     General: No focal deficit present.     Mental Status: He is alert and oriented to person, place, and time.  Psychiatric:        Mood and Affect: Mood normal.        Behavior: Behavior normal.     LABS:      Latest Ref Rng & Units 02/13/2023    1:02 PM 01/24/2023   12:08 PM 01/01/2023   11:26 AM  CBC  WBC 4.0 - 10.5 K/uL 6.6  5.9  7.0   Hemoglobin 13.0 - 17.0 g/dL 16.1  09.6  04.5   Hematocrit 39.0 - 52.0 % 36.6  36.8  34.1   Platelets 150 - 400 K/uL 131  140  137       Latest Ref Rng & Units 02/13/2023    1:02 PM 01/24/2023   12:08 PM 01/01/2023   11:26 AM  CMP  Glucose 70 - 99 mg/dL 409  811  96   BUN 8 - 23 mg/dL 23  27  21    Creatinine 0.61 - 1.24 mg/dL 9.14  7.82  9.56   Sodium 135 - 145 mmol/L 139  135  136   Potassium 3.5 - 5.1 mmol/L 4.1  4.2  4.0   Chloride 98 - 111 mmol/L 109  107  106   CO2 22 - 32 mmol/L 23  22  23    Calcium 8.9 - 10.3 mg/dL 8.9  8.6  9.0   Total Protein 6.5 - 8.1 g/dL 6.7  6.7  6.4   Total Bilirubin 0.0 - 1.2 mg/dL 0.6  0.7  0.7   Alkaline Phos 38 - 126 U/L 60  65  55   AST 15 - 41 U/L 19  22  23    ALT 0 - 44 U/L 13  19  17       No results found for: "CEA1", "CEA" / No results found for: "CEA1", "CEA" Lab Results  Component Value Date   PSA1 1.2 11/25/2019   No results found for: "OZH086" No results found for: "CAN125"  No results found for: "TOTALPROTELP", "ALBUMINELP", "A1GS", "A2GS", "BETS", "BETA2SER", "GAMS", "MSPIKE", "SPEI" Lab Results  Component Value Date   TIBC 233 (L) 08/21/2022   TIBC 253 06/13/2022   TIBC 291 04/01/2022   FERRITIN 372 (H) 08/21/2022   FERRITIN 501 (H) 06/13/2022   FERRITIN 86 04/01/2022   IRONPCTSAT 37 08/21/2022   IRONPCTSAT 24 06/13/2022   IRONPCTSAT 22 04/01/2022   No results found for: "LDH"   STUDIES:   No results found.

## 2023-02-13 NOTE — Patient Instructions (Signed)

## 2023-02-20 ENCOUNTER — Encounter: Payer: Self-pay | Admitting: Hematology

## 2023-02-26 ENCOUNTER — Emergency Department (HOSPITAL_COMMUNITY): Payer: HMO

## 2023-02-26 ENCOUNTER — Emergency Department (HOSPITAL_COMMUNITY)
Admission: EM | Admit: 2023-02-26 | Discharge: 2023-02-26 | Disposition: A | Discharge status: Home / Self Care | Payer: HMO | Attending: Emergency Medicine | Admitting: Emergency Medicine

## 2023-02-26 ENCOUNTER — Encounter (HOSPITAL_COMMUNITY): Payer: Self-pay

## 2023-02-26 ENCOUNTER — Other Ambulatory Visit: Payer: Self-pay

## 2023-02-26 DIAGNOSIS — Z7901 Long term (current) use of anticoagulants: Secondary | ICD-10-CM | POA: Insufficient documentation

## 2023-02-26 DIAGNOSIS — J449 Chronic obstructive pulmonary disease, unspecified: Secondary | ICD-10-CM | POA: Insufficient documentation

## 2023-02-26 DIAGNOSIS — S299XXA Unspecified injury of thorax, initial encounter: Secondary | ICD-10-CM | POA: Diagnosis present

## 2023-02-26 DIAGNOSIS — W19XXXA Unspecified fall, initial encounter: Secondary | ICD-10-CM | POA: Diagnosis not present

## 2023-02-26 DIAGNOSIS — R944 Abnormal results of kidney function studies: Secondary | ICD-10-CM | POA: Diagnosis not present

## 2023-02-26 DIAGNOSIS — I4891 Unspecified atrial fibrillation: Secondary | ICD-10-CM | POA: Diagnosis not present

## 2023-02-26 DIAGNOSIS — Z7951 Long term (current) use of inhaled steroids: Secondary | ICD-10-CM | POA: Diagnosis not present

## 2023-02-26 DIAGNOSIS — Z8673 Personal history of transient ischemic attack (TIA), and cerebral infarction without residual deficits: Secondary | ICD-10-CM | POA: Insufficient documentation

## 2023-02-26 DIAGNOSIS — R930 Abnormal findings on diagnostic imaging of skull and head, not elsewhere classified: Secondary | ICD-10-CM | POA: Insufficient documentation

## 2023-02-26 DIAGNOSIS — S2232XA Fracture of one rib, left side, initial encounter for closed fracture: Secondary | ICD-10-CM | POA: Diagnosis not present

## 2023-02-26 DIAGNOSIS — Z79899 Other long term (current) drug therapy: Secondary | ICD-10-CM | POA: Diagnosis not present

## 2023-02-26 DIAGNOSIS — M79604 Pain in right leg: Secondary | ICD-10-CM | POA: Insufficient documentation

## 2023-02-26 LAB — URINALYSIS, ROUTINE W REFLEX MICROSCOPIC
Bilirubin Urine: NEGATIVE
Glucose, UA: NEGATIVE mg/dL
Hgb urine dipstick: NEGATIVE
Ketones, ur: NEGATIVE mg/dL
Leukocytes,Ua: NEGATIVE
Nitrite: NEGATIVE
Protein, ur: NEGATIVE mg/dL
Specific Gravity, Urine: 1.016 (ref 1.005–1.030)
pH: 6 (ref 5.0–8.0)

## 2023-02-26 LAB — BASIC METABOLIC PANEL
Anion gap: 10 (ref 5–15)
BUN: 23 mg/dL (ref 8–23)
CO2: 25 mmol/L (ref 22–32)
Calcium: 8.8 mg/dL — ABNORMAL LOW (ref 8.9–10.3)
Chloride: 102 mmol/L (ref 98–111)
Creatinine, Ser: 1.43 mg/dL — ABNORMAL HIGH (ref 0.61–1.24)
GFR, Estimated: 51 mL/min — ABNORMAL LOW (ref >60)
Glucose, Bld: 99 mg/dL (ref 70–99)
Potassium: 4.5 mmol/L (ref 3.5–5.1)
Sodium: 137 mmol/L (ref 135–145)

## 2023-02-26 LAB — CBC
HCT: 41.3 % (ref 39.0–52.0)
Hemoglobin: 13.4 g/dL (ref 13.0–17.0)
MCH: 31.5 pg (ref 26.0–34.0)
MCHC: 32.4 g/dL (ref 30.0–36.0)
MCV: 96.9 fL (ref 80.0–100.0)
Platelets: 127 10*3/uL — ABNORMAL LOW (ref 150–400)
RBC: 4.26 MIL/uL (ref 4.22–5.81)
RDW: 13.2 % (ref 11.5–15.5)
WBC: 9 10*3/uL (ref 4.0–10.5)
nRBC: 0 % (ref 0.0–0.2)

## 2023-02-26 LAB — TROPONIN I (HIGH SENSITIVITY): Troponin I (High Sensitivity): 6 ng/L (ref <18)

## 2023-02-26 MED ORDER — ACETAMINOPHEN 325 MG PO TABS
650.0000 mg | ORAL_TABLET | Freq: Four times a day (QID) | ORAL | Status: DC | PRN
Start: 2023-02-26 — End: 2023-02-26

## 2023-02-26 MED ORDER — OXYCODONE-ACETAMINOPHEN 5-325 MG PO TABS
1.0000 | ORAL_TABLET | Freq: Three times a day (TID) | ORAL | 0 refills | Status: AC | PRN
  Filled 2023-02-26: qty 15, 5d supply, fill #0

## 2023-02-26 MED ORDER — HYDROCODONE-ACETAMINOPHEN 5-325 MG PO TABS
1.0000 | ORAL_TABLET | Freq: Once | ORAL | Status: AC
  Administered 2023-02-26: 1 via ORAL
  Filled 2023-02-26: qty 1

## 2023-02-26 NOTE — ED Triage Notes (Signed)
 Pt BIB ems from home for multiple falls. Per EMS pt states he is hurting from falling last night not today. Pt has rt leg/ knee pain, and left side rib pain.

## 2023-02-26 NOTE — Discharge Instructions (Addendum)
 Today you were seen for rib fracture after a fall.  Please use your incentive spirometer multiple times daily.  You may take Percocet for severe pain.  Please follow-up with your primary care in the upcoming week for further evaluation and possible treatment.  Thank you for letting us  treat you today. After reviewing your labs and imaging, I feel you are safe to go home. Please follow up with your PCP in the next several days and provide them with your records from this visit. Return to the Emergency Room if pain becomes severe or symptoms worsen.

## 2023-02-26 NOTE — ED Provider Notes (Signed)
 [place] EMERGENCY DEPARTMENT AT [facility] Provider Note   CSN: [id number] Arrival date & time: 02/26/23  1350     History  Chief Complaint  Patient presents with   Chrsitopher Wik is a 78 y.o. male past medical history significant for stroke, A-fib, NSTEMI, AAA, TIA, and COPD presents today for multiple falls.  Patient states that he fell last night and has right leg/knee pain and left-sided rib pain.  Patient does take Eliquis .  Patient had an unwitnessed fall but is unsteady on his feet generally from previous strokes.   Fall       Home Medications Prior to Admission medications   Medication Sig Start Date End Date Taking? Authorizing Provider  oxyCODONE -acetaminophen  (PERCOCET/ROXICET) 5-325 MG tablet Take 1 tablet by mouth every 8 (eight) hours as needed for up to 5 days for severe pain (pain score 7-10). 02/26/23 03/03/23 Yes Francis Ileana SAILOR, PA-C  albuterol  (VENTOLIN  HFA) 108 (90 Base) MCG/ACT inhaler Inhale 2 puffs into the lungs every 6 (six) hours as needed for wheezing or shortness of breath. 01/23/22   Shelah Lamar RAMAN, MD  apixaban  (ELIQUIS ) 5 MG TABS tablet Take 1 tablet (5 mg total) by mouth 2 (two) times daily. 06/19/22   Cook, Jayce G, DO  atorvastatin  (LIPITOR ) 80 MG tablet Take 1 tablet (80 mg total) by mouth daily. 06/19/22   Cook, Jayce G, DO  chlorthalidone  (HYGROTON ) 25 MG tablet 1 tablet   every other day Orally Once a day 10/10/20   [provider]  cyclobenzaprine  (FLEXERIL ) 10 MG tablet Take 1 tablet (10 mg total) by mouth 2 (two) times daily as needed. 06/19/22   Cook, Jayce G, DO  escitalopram  (LEXAPRO ) 10 MG tablet Take 1 tablet (10 mg total) by mouth daily. 06/19/22   Cook, Jayce G, DO  folic acid  (KP FOLIC ACID ) 1 MG tablet Take 1 tablet (1 mg total) by mouth daily. 06/19/22   Cook, Jayce G, DO  furosemide  (LASIX ) 20 MG tablet Take 1 tablet (20 mg total) by mouth daily as needed. 06/19/22   Cook, Jayce G, DO  magnesium  oxide (MAG-OX)  400 (240 Mg) MG tablet Take 1 tablet (400 mg total) by mouth daily. 06/19/22   Cook, Jayce G, DO  metoprolol  succinate (TOPROL -XL) 25 MG 24 hr tablet Take 1 tablet (25 mg total) by mouth daily. 02/04/23   Cook, Jayce G, DO  pantoprazole  (PROTONIX ) 40 MG tablet Take 1 tablet (40 mg total) by mouth daily. 06/19/22   Cook, Jayce G, DO  PEMBROLIZUMAB  IV Inject 200 mg into the vein every 21 ( twenty-one) days.    [provider]  tamsulosin  (FLOMAX ) 0.4 MG CAPS capsule Take 1 capsule (0.4 mg total) by mouth daily. 06/12/22   Cook, Jayce G, DO  Tiotropium Bromide  Monohydrate (SPIRIVA  RESPIMAT) 2.5 MCG/ACT AERS Inhale 2 puffs into the lungs daily. 06/19/22   Cook, Jayce G, DO      Allergies    Patient has no known allergies.    Review of Systems   Review of Systems  Musculoskeletal:  Positive for arthralgias.    Physical Exam Updated Vital Signs BP (!) 138/91 (BP Location: Left Arm)   Pulse 72   Temp 97.8 F (36.6 C) (Oral)   Resp 18   Ht 5' 10 (1.778 m)   Wt 84.4 kg   SpO2 100%   BMI 26.69 kg/m  Physical Exam Vitals and nursing note reviewed.  Constitutional:  General: He is not in acute distress.    Appearance: He is well-developed.  HENT:     Head: Normocephalic and atraumatic. No raccoon eyes or Battle's sign.     Jaw: There is normal jaw occlusion.     Right Ear: External ear normal.     Left Ear: External ear normal.     Mouth/Throat:     Mouth: Mucous membranes are moist.     Pharynx: Oropharynx is clear.  Eyes:     Conjunctiva/sclera: Conjunctivae normal.  Cardiovascular:     Rate and Rhythm: Normal rate and regular rhythm.     Pulses: Normal pulses.     Heart sounds: Normal heart sounds. No murmur heard. Pulmonary:     Effort: Pulmonary effort is normal. No respiratory distress.     Breath sounds: Normal breath sounds.  Abdominal:     Palpations: Abdomen is soft.     Tenderness: There is no abdominal tenderness.  Musculoskeletal:        General: No  swelling or deformity.     Cervical back: Normal range of motion and neck supple. No rigidity.     Comments: Patient has tenderness to palpation of the right tibial plateau with abrasion and mild ecchymosis noted.  Patient is neurovascularly intact bilaterally.  Patient has +1 dorsalis pedis pulses.  Patient unable to straight leg lift legs off the bed.  No obvious deformity noted.  Patient also has TTP to the left, anterior ribs.  No ecchymosis or flail chest noted.  Skin:    General: Skin is warm and dry.     Capillary Refill: Capillary refill takes less than 2 seconds.     Findings: Bruising present.  Neurological:     Mental Status: He is alert. Mental status is at baseline.  Psychiatric:        Mood and Affect: Mood normal.     ED Results / Procedures / Treatments   Labs (all labs ordered are listed, but only abnormal results are displayed) Labs Reviewed  CBC - Abnormal; Notable for the following components:      Result Value   Platelets 127 (*)    All other components within normal limits  BASIC METABOLIC PANEL - Abnormal; Notable for the following components:   Creatinine, Ser 1.43 (*)    Calcium  8.8 (*)    GFR, Estimated 51 (*)    All other components within normal limits  URINALYSIS, ROUTINE W REFLEX MICROSCOPIC  TROPONIN I (HIGH SENSITIVITY)    EKG None  Radiology DG Pelvis Portable Result Date: 02/26/2023 CLINICAL DATA:  Fall.  Pelvic pain. EXAM: PORTABLE PELVIS 1-2 VIEWS; DG HIP (WITH OR WITHOUT PELVIS) 2-3V RIGHT COMPARISON:  None Available. 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 The arthroplasty appears intact. There is no acute fracture or dislocation. L5-S1 fusion. The soft tissues are unremarkable IMPRESSION: 1. No acute fracture or dislocation. 2. Right hip arthroplasty. Electronically Signed   By: Vanetta Chou M.D.   On: 02/26/2023 16:10   DG Hip Unilat With Pelvis 2-3 Views Right Result Date: 02/26/2023 CLINICAL DATA:  Fall.  Pelvic pain. EXAM:  PORTABLE PELVIS 1-2 VIEWS; DG HIP (WITH OR WITHOUT PELVIS) 2-3V RIGHT COMPARISON:  None Available. 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 The arthroplasty appears intact. There is no acute fracture or dislocation. L5-S1 fusion. The soft tissues are unremarkable IMPRESSION: 1. No acute fracture or dislocation. 2. Right hip arthroplasty. Electronically Signed   By: Vanetta Chou HERO.D.  On: 02/26/2023 16:10   DG Tibia/Fibula Right Result Date: 02/26/2023 CLINICAL DATA:  Clemens yesterday with lower leg pain EXAM: RIGHT TIBIA AND FIBULA - 2 VIEW COMPARISON:  None Available. FINDINGS: There is no evidence of fracture or other focal bone lesions. Soft tissues are unremarkable. IMPRESSION: Negative. Electronically Signed   By: Oneil Officer M.D.   On: 02/26/2023 16:10   DG Ribs Unilateral W/Chest Left Result Date: 02/26/2023 CLINICAL DATA:  Fall and left chest wall pain. EXAM: LEFT RIBS AND CHEST - 3+ VIEW COMPARISON:  Chest radiograph dated 07/09/2022. FINDINGS: Left-sided Port-A-Cath with tip at the cavoatrial junction. There is shallow inspiration. Mild interstitial of the right hemidiaphragm. There are bibasilar atelectasis. Postsurgical changes and scarring in the right upper lobe with right apical pleural thickening. No consolidative changes. There is no pleural effusion or pneumothorax. The cardiac silhouette is within normal limits. Atherosclerotic calcification of the aorta. Mildly displaced fracture of the lateral left ninth and possibly eighth ribs. IMPRESSION: 1. Mildly displaced fracture of the lateral left ninth and possibly eighth ribs. No pneumothorax. 2. Bibasilar atelectasis. Electronically Signed   By: Vanetta Chou M.D.   On: 02/26/2023 16:08   DG Knee Complete 4 Views Right Result Date: 02/26/2023 CLINICAL DATA:  Fall EXAM: RIGHT KNEE - COMPLETE 4+ VIEW COMPARISON:  None Available. FINDINGS: No evidence of fracture, dislocation, or joint effusion. No evidence of arthropathy or other focal  bone abnormality. Peripheral vascular calcifications are present. Soft tissues are unremarkable. IMPRESSION: 1. No acute fracture or dislocation. 2. Peripheral vascular calcifications. Electronically Signed   By: Greig Pique M.D.   On: 02/26/2023 16:05   CT Head Wo Contrast Result Date: 02/26/2023 CLINICAL DATA:  Provided history: Head trauma, minor. Neck trauma. Multiple falls. EXAM: CT HEAD WITHOUT CONTRAST CT CERVICAL SPINE WITHOUT CONTRAST TECHNIQUE: Multidetector CT imaging of the head and cervical spine was performed following the standard protocol without intravenous contrast. Multiplanar CT image reconstructions of the cervical spine were also generated. RADIATION DOSE REDUCTION: This exam was performed according to the departmental dose-optimization program which includes automated exposure control, adjustment of the mA and/or kV according to patient size and/or use of iterative reconstruction technique. COMPARISON:  Prior brain MRI examinations 05/02/2022 and earlier. Head CT 05/01/2022. CT angiogram head/neck 02/04/2020. FINDINGS: CT HEAD FINDINGS: Brain: Generalized cerebral and cerebellar atrophy. Chronic infarcts within the bilateral cerebral hemispheric white matter and within/about the bilateral basal ganglia, some of which were better appreciated on prior brain MRI examinations. Moderate patchy and ill-defined hypoattenuation elsewhere within the cerebral white matter, nonspecific but compatible with chronic small vessel ischemic disease. A meningioma along the right sphenoid wing has not appreciably changed in size since the brain MRI of 07/11/2021, again measuring 2.5 x 1.7 cm on axial slices (for instance as seen on series 2, image 9). As before, the mass focally indents the underlying right temporal lobe. There is no acute intracranial hemorrhage. No demarcated cortical infarct. No extra-axial fluid collection. No midline shift. Vascular: No hyperdense vessel.  Atherosclerotic  calcifications. Skull: No calvarial fracture or aggressive osseous lesion. Sinuses/Orbits: No mass or acute finding within the imaged orbits. Minimal mucosal thickening scattered within the paranasal sinuses at the imaged levels. Other: Bilateral mastoid effusions. CT CERVICAL SPINE FINDINGS Alignment: 2 mm C7-T1 grade 1 anterolisthesis. Skull base and vertebrae: The basion-dental and atlanto-dental intervals are maintained.No evidence of acute fracture to the cervical spine. Soft tissues and spinal canal: No prevertebral fluid or swelling. No visible canal hematoma. Disc levels:  Cervical spondylosis with multilevel disc space narrowing, disc bulges/central disc protrusions, uncovertebral hypertrophy and facet arthrosis. Disc space narrowing is greatest at C6-C7 (advanced at this level). Multilevel spinal canal narrowing. Most notably at C6-C7, a posterior disc osteophyte complex contributes to apparent mild/moderate spinal canal stenosis. Multilevel bony neural foraminal narrowing. Upper chest: No acute finding at the imaged levels. Partially imaged left-sided central venous catheter. Other: 10 mm nodule within the left parotid gland inferiorly (series 4, image 40). IMPRESSION: CT head: 1. No evidence of an acute intracranial abnormality. 2. 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 3. Parenchymal atrophy, chronic small vessel ischemic disease and chronic infarcts, as described. 4. Minor paranasal sinus mucosal thickening at the imaged levels. 5. Bilateral mastoid effusions. CT cervical spine: 1. No evidence of an acute cervical spine fracture. 2. Mild C7-T1 grade 1 anterolisthesis. 3. Cervical spondylosis as described. 4. 10 mm nodule within the left parotid gland inferiorly suspicious for a primary parotid neoplasm. This could reflect the same lesion described on the prior CTA head/neck of 02/04/2020 or a new lesion. Correlate with  medical/procedural history and consider ENT referral. Electronically Signed   By: Rockey Childs D.O.   On: 02/26/2023 15:42   CT Cervical Spine Wo Contrast Result Date: 02/26/2023 CLINICAL DATA:  Provided history: Head trauma, minor. Neck trauma. Multiple falls. EXAM: CT HEAD WITHOUT CONTRAST CT CERVICAL SPINE WITHOUT CONTRAST TECHNIQUE: Multidetector CT imaging of the head and cervical spine was performed following the standard protocol without intravenous contrast. Multiplanar CT image reconstructions of the cervical spine were also generated. RADIATION DOSE REDUCTION: This exam was performed according to the departmental dose-optimization program which includes automated exposure control, adjustment of the mA and/or kV according to patient size and/or use of iterative reconstruction technique. COMPARISON:  Prior brain MRI examinations 05/02/2022 and earlier. Head CT 05/01/2022. CT angiogram head/neck 02/04/2020. FINDINGS: CT HEAD FINDINGS: Brain: Generalized cerebral and cerebellar atrophy. Chronic infarcts within the bilateral cerebral hemispheric white matter and within/about the bilateral basal ganglia, some of which were better appreciated on prior brain MRI examinations. Moderate patchy and ill-defined hypoattenuation elsewhere within the cerebral white matter, nonspecific but compatible with chronic small vessel ischemic disease. A meningioma along the right sphenoid wing has not appreciably changed in size since the brain MRI of 07/11/2021, again measuring 2.5 x 1.7 cm on axial slices (for instance as seen on series 2, image 9). As before, the mass focally indents the underlying right temporal lobe. There is no acute intracranial hemorrhage. No demarcated cortical infarct. No extra-axial fluid collection. No midline shift. Vascular: No hyperdense vessel.  Atherosclerotic calcifications. Skull: No calvarial fracture or aggressive osseous lesion. Sinuses/Orbits: No mass or acute finding within the imaged  orbits. Minimal mucosal thickening scattered within the paranasal sinuses at the imaged levels. Other: Bilateral mastoid effusions. CT CERVICAL SPINE FINDINGS Alignment: 2 mm C7-T1 grade 1 anterolisthesis. Skull base and vertebrae: The basion-dental and atlanto-dental intervals are maintained.No evidence of acute fracture to the cervical spine. Soft tissues and spinal canal: No prevertebral fluid or swelling. No visible canal hematoma. Disc levels: Cervical spondylosis with multilevel disc space narrowing, disc bulges/central disc protrusions, uncovertebral hypertrophy and facet arthrosis. Disc space narrowing is greatest at C6-C7 (advanced at this level). Multilevel spinal canal narrowing. Most notably at C6-C7, a posterior disc osteophyte complex contributes to apparent mild/moderate spinal canal stenosis. Multilevel bony neural foraminal narrowing. Upper chest: No acute finding at the  imaged levels. Partially imaged left-sided central venous catheter. Other: 10 mm nodule within the left parotid gland inferiorly (series 4, image 40). IMPRESSION: CT head: 1. No evidence of an acute intracranial abnormality. 2. 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 3. Parenchymal atrophy, chronic small vessel ischemic disease and chronic infarcts, as described. 4. Minor paranasal sinus mucosal thickening at the imaged levels. 5. Bilateral mastoid effusions. CT cervical spine: 1. No evidence of an acute cervical spine fracture. 2. Mild C7-T1 grade 1 anterolisthesis. 3. Cervical spondylosis as described. 4. 10 mm nodule within the left parotid gland inferiorly suspicious for a primary parotid neoplasm. This could reflect the same lesion described on the prior CTA head/neck of 02/04/2020 or a new lesion. Correlate with medical/procedural history and consider ENT referral. Electronically Signed   By: Rockey Childs D.O.   On: 02/26/2023 15:42     Procedures Procedures    Medications Ordered in ED Medications  acetaminophen  (TYLENOL ) tablet 650 mg (has no administration in time range)  HYDROcodone -acetaminophen  (NORCO/VICODIN) 5-325 MG per tablet 1 tablet (has no administration in time range)    ED Course/ Medical Decision Making/ A&P                                 Medical Decision Making Amount and/or Complexity of Data Reviewed Labs: ordered. Radiology: ordered.  Risk OTC drugs.   This patient presents to the ED with chief complaint(s) of fall with pertinent past medical history of stroke, A-fib, NSTEMI, AAA, TIA, and COPD which further complicates the presenting complaint. The complaint involves an extensive differential diagnosis and also carries with it a high risk of complications and morbidity.    The differential diagnosis includes UTI, mechanical fall, brain bleed, C-spine injury, tibia fracture, fibular fracture, patellar fracture, rib fracture, ACS, arrhythmia  Additional history obtained: Additional history obtained from significant other Records reviewed oncology office visit notes  ED Course and Reassessment:   Independent labs interpretation:  The following labs were independently interpreted:  CBC: Mildly decreased platelets at 127 BMP: Elevated creatinine which is chronic for cervical values, slightly decreased calcium  EKG: Sinus rhythm UA: No notable findings Troponin: 6  Independent visualization of imaging: - I independently visualized the following imaging with scope of interpretation limited to determining acute life threatening conditions related to emergency care:  CT head/C-spine noncon: No evidence of acute intracranial abnormality, no evidence of acute C-spine fracture Right hip x-ray: No acute fracture or dislocation Right knee x-ray: No acute fracture or dislocation Left rib x-ray: Mildly displaced fracture of the lateral left ninth and possibly 8th rib, no pneumothorax Right  tib-fib x-ray: Negative  Consultation: - Consulted or discussed management/test interpretation w/ external professional: None  Consideration for admission or further workup: Stated for admission further evaluation vital signs, physical exam, labs, and imaging were reassuring.  Patient given short outpatient course of Percocet for severe pain and symptoms to monitor to maintain adequate lung expansion.  Patient to follow-up with primary care in the upcoming week for further evaluation and treatment.        Final Clinical Impression(s) / ED Diagnoses Final diagnoses:  Fall, initial encounter  Right leg pain  Closed fracture of one rib of left side, initial encounter    Rx / DC Orders ED Discharge Orders          Ordered  oxyCODONE -acetaminophen  (PERCOCET/ROXICET) 5-325 MG tablet  Every 8 hours PRN        02/26/23 1722              Francis Ileana SAILOR, PA-C 02/26/23 1723    Yolande Lamar BROCKS, MD 02/27/23 [phone]

## 2023-02-27 ENCOUNTER — Other Ambulatory Visit (HOSPITAL_COMMUNITY): Payer: Self-pay

## 2023-02-27 ENCOUNTER — Other Ambulatory Visit: Payer: Self-pay

## 2023-03-05 ENCOUNTER — Other Ambulatory Visit (HOSPITAL_COMMUNITY): Payer: Self-pay

## 2023-03-06 ENCOUNTER — Inpatient Hospital Stay: Payer: HMO | Attending: Hematology

## 2023-03-06 ENCOUNTER — Inpatient Hospital Stay: Payer: HMO

## 2023-03-06 ENCOUNTER — Other Ambulatory Visit: Payer: Self-pay | Admitting: *Deleted

## 2023-03-06 ENCOUNTER — Inpatient Hospital Stay: Payer: HMO | Admitting: Hematology

## 2023-03-06 VITALS — Temp 98.3°F

## 2023-03-06 DIAGNOSIS — R42 Dizziness and giddiness: Secondary | ICD-10-CM

## 2023-03-06 DIAGNOSIS — R296 Repeated falls: Secondary | ICD-10-CM

## 2023-03-06 DIAGNOSIS — C3411 Malignant neoplasm of upper lobe, right bronchus or lung: Secondary | ICD-10-CM | POA: Insufficient documentation

## 2023-03-06 DIAGNOSIS — Z79899 Other long term (current) drug therapy: Secondary | ICD-10-CM | POA: Insufficient documentation

## 2023-03-06 DIAGNOSIS — Z95828 Presence of other vascular implants and grafts: Secondary | ICD-10-CM

## 2023-03-06 DIAGNOSIS — Z5112 Encounter for antineoplastic immunotherapy: Secondary | ICD-10-CM | POA: Insufficient documentation

## 2023-03-06 LAB — COMPREHENSIVE METABOLIC PANEL
ALT: 12 U/L (ref 0–44)
AST: 20 U/L (ref 15–41)
Albumin: 3.4 g/dL — ABNORMAL LOW (ref 3.5–5.0)
Alkaline Phosphatase: 71 U/L (ref 38–126)
Anion gap: 9 (ref 5–15)
BUN: 23 mg/dL (ref 8–23)
CO2: 24 mmol/L (ref 22–32)
Calcium: 9 mg/dL (ref 8.9–10.3)
Chloride: 105 mmol/L (ref 98–111)
Creatinine, Ser: 1.57 mg/dL — ABNORMAL HIGH (ref 0.61–1.24)
GFR, Estimated: 45 mL/min — ABNORMAL LOW (ref >60)
Glucose, Bld: 111 mg/dL — ABNORMAL HIGH (ref 70–99)
Potassium: 4 mmol/L (ref 3.5–5.1)
Sodium: 138 mmol/L (ref 135–145)
Total Bilirubin: 0.7 mg/dL (ref 0.0–1.2)
Total Protein: 7.1 g/dL (ref 6.5–8.1)

## 2023-03-06 LAB — CBC WITH DIFFERENTIAL/PLATELET
Abs Immature Granulocytes: 0.02 10*3/uL (ref 0.00–0.07)
Basophils Absolute: 0.1 10*3/uL (ref 0.0–0.1)
Basophils Relative: 1 %
Eosinophils Absolute: 0.3 10*3/uL (ref 0.0–0.5)
Eosinophils Relative: 3 %
HCT: 34.7 % — ABNORMAL LOW (ref 39.0–52.0)
Hemoglobin: 11.7 g/dL — ABNORMAL LOW (ref 13.0–17.0)
Immature Granulocytes: 0 %
Lymphocytes Relative: 13 %
Lymphs Abs: 1 10*3/uL (ref 0.7–4.0)
MCH: 31.8 pg (ref 26.0–34.0)
MCHC: 33.7 g/dL (ref 30.0–36.0)
MCV: 94.3 fL (ref 80.0–100.0)
Monocytes Absolute: 0.6 10*3/uL (ref 0.1–1.0)
Monocytes Relative: 8 %
Neutro Abs: 5.5 10*3/uL (ref 1.7–7.7)
Neutrophils Relative %: 75 %
Platelets: 159 10*3/uL (ref 150–400)
RBC: 3.68 MIL/uL — ABNORMAL LOW (ref 4.22–5.81)
RDW: 13.1 % (ref 11.5–15.5)
WBC: 7.4 10*3/uL (ref 4.0–10.5)
nRBC: 0 % (ref 0.0–0.2)

## 2023-03-06 LAB — MAGNESIUM: Magnesium: 2.1 mg/dL (ref 1.7–2.4)

## 2023-03-06 LAB — TSH: TSH: 3.473 u[IU]/mL (ref 0.350–4.500)

## 2023-03-06 MED ORDER — HEPARIN SOD (PORK) LOCK FLUSH 100 UNIT/ML IV SOLN
500.0000 [IU] | Freq: Once | INTRAVENOUS | Status: AC
  Administered 2023-03-06: 500 [IU] via INTRAVENOUS

## 2023-03-06 MED ORDER — SODIUM CHLORIDE FLUSH 0.9 % IV SOLN
10.0000 mL | Freq: Once | INTRAVENOUS | Status: AC
  Administered 2023-03-06: 10 mL via INTRAVENOUS
  Filled 2023-03-06: qty 10

## 2023-03-06 NOTE — Patient Instructions (Signed)
[facility] [place] - A DEPT OF [facility]  Discharge Instructions: Thank you for choosing [facility] to provide your oncology and hematology care.  If you have a lab appointment with the Cancer Center - please note that after April 8th, 2024, all labs will be drawn in the cancer center.  You do not have to check in or register with the main entrance as you have in the past but will complete your check-in in the cancer center.  Wear comfortable clothing and clothing appropriate for easy access to any Portacath or PICC line.   We strive to give you quality time with your provider. You may need to reschedule your appointment if you arrive late (15 or more minutes).  Arriving late affects you and other patients whose appointments are after yours.  Also, if you miss three or more appointments without notifying the office, you may be dismissed from the clinic at the provider's discretion.      For prescription refill requests, have your pharmacy contact our office and allow 72 hours for refills to be completed.    Today you received the following port flushed, treatment held. Return at scheduled time.   To help prevent nausea and vomiting after your treatment, we encourage you to take your nausea medication as directed.  BELOW ARE SYMPTOMS THAT SHOULD BE REPORTED IMMEDIATELY: *FEVER GREATER THAN 100.4 F (38 C) OR HIGHER *CHILLS OR SWEATING *NAUSEA AND VOMITING THAT IS NOT CONTROLLED WITH YOUR NAUSEA MEDICATION *UNUSUAL SHORTNESS OF BREATH *UNUSUAL BRUISING OR BLEEDING *URINARY PROBLEMS (pain or burning when urinating, or frequent urination) *BOWEL PROBLEMS (unusual diarrhea, constipation, pain near the anus) TENDERNESS IN MOUTH AND THROAT WITH OR WITHOUT PRESENCE OF ULCERS (sore throat, sores in mouth, or a toothache) UNUSUAL RASH, SWELLING OR PAIN  UNUSUAL VAGINAL DISCHARGE OR ITCHING   Items with * indicate a potential emergency and should be followed  up as soon as possible or go to the Emergency Department if any problems should occur.  Please show the CHEMOTHERAPY ALERT CARD or IMMUNOTHERAPY ALERT CARD at check-in to the Emergency Department and triage nurse.  Should you have questions after your visit or need to cancel or reschedule your appointment, please contact [facility] [place] - A DEPT OF [facility] [phone]  and follow the prompts.  Office hours are 8:00 a.m. to 4:30 p.m. Monday - Friday. Please note that voicemails left after 4:00 p.m. may not be returned until the following business day.  We are closed weekends and major holidays. You have access to a nurse at all times for urgent questions. Please call the main number to the clinic [phone] and follow the prompts.  For any non-urgent questions, you may also contact your provider using MyChart. We now offer e-Visits for anyone 65 and older to request care online for non-urgent symptoms. For details visit [email or web address].   Also download the MyChart app! Go to the app store, search "MyChart", open the app, select [place], and log in with your MyChart username and password.

## 2023-03-06 NOTE — Progress Notes (Signed)
Patient presents today for [facility], patient reports multiple falls, 2-3 falls per week, patient went to ED last week for broken ribs and injured knee from fall, increased weakness. Dr. Ellin Saba made aware of symptoms and advised to hold treatment today, MRI scheduled for prior to next visit.

## 2023-03-07 ENCOUNTER — Ambulatory Visit (HOSPITAL_COMMUNITY)
Admission: RE | Admit: 2023-03-07 | Discharge: 2023-03-07 | Disposition: A | Discharge status: Home / Self Care | Payer: HMO | Source: Ambulatory Visit | Attending: Hematology | Admitting: Hematology

## 2023-03-07 DIAGNOSIS — I63542 Cerebral infarction due to unspecified occlusion or stenosis of left cerebellar artery: Secondary | ICD-10-CM | POA: Diagnosis not present

## 2023-03-07 DIAGNOSIS — R42 Dizziness and giddiness: Secondary | ICD-10-CM | POA: Insufficient documentation

## 2023-03-07 DIAGNOSIS — R531 Weakness: Secondary | ICD-10-CM | POA: Diagnosis not present

## 2023-03-07 DIAGNOSIS — C3411 Malignant neoplasm of upper lobe, right bronchus or lung: Secondary | ICD-10-CM | POA: Insufficient documentation

## 2023-03-07 DIAGNOSIS — R296 Repeated falls: Secondary | ICD-10-CM | POA: Insufficient documentation

## 2023-03-07 MED ORDER — GADOBUTROL 1 MMOL/ML IV SOLN
7.5000 mL | Freq: Once | INTRAVENOUS | Status: AC | PRN
  Administered 2023-03-07: 7.5 mL via INTRAVENOUS

## 2023-03-10 ENCOUNTER — Encounter (HOSPITAL_COMMUNITY): Payer: Self-pay

## 2023-03-10 ENCOUNTER — Other Ambulatory Visit: Payer: Self-pay

## 2023-03-10 ENCOUNTER — Emergency Department (HOSPITAL_COMMUNITY): Payer: HMO

## 2023-03-10 ENCOUNTER — Inpatient Hospital Stay (HOSPITAL_COMMUNITY)
Admission: EM | Admit: 2023-03-10 | Discharge: 2023-03-14 | DRG: 065 | Disposition: A | Discharge status: Skilled Nursing Facility | Payer: HMO | Attending: Internal Medicine | Admitting: Internal Medicine

## 2023-03-10 DIAGNOSIS — Z7901 Long term (current) use of anticoagulants: Secondary | ICD-10-CM | POA: Diagnosis not present

## 2023-03-10 DIAGNOSIS — Z79899 Other long term (current) drug therapy: Secondary | ICD-10-CM | POA: Diagnosis not present

## 2023-03-10 DIAGNOSIS — I6389 Other cerebral infarction: Principal | ICD-10-CM | POA: Diagnosis present

## 2023-03-10 DIAGNOSIS — Z8249 Family history of ischemic heart disease and other diseases of the circulatory system: Secondary | ICD-10-CM | POA: Diagnosis not present

## 2023-03-10 DIAGNOSIS — Z87891 Personal history of nicotine dependence: Secondary | ICD-10-CM

## 2023-03-10 DIAGNOSIS — Z7951 Long term (current) use of inhaled steroids: Secondary | ICD-10-CM

## 2023-03-10 DIAGNOSIS — C3491 Malignant neoplasm of unspecified part of right bronchus or lung: Secondary | ICD-10-CM | POA: Diagnosis present

## 2023-03-10 DIAGNOSIS — I252 Old myocardial infarction: Secondary | ICD-10-CM

## 2023-03-10 DIAGNOSIS — R29700 NIHSS score 0: Secondary | ICD-10-CM | POA: Diagnosis present

## 2023-03-10 DIAGNOSIS — E785 Hyperlipidemia, unspecified: Secondary | ICD-10-CM | POA: Diagnosis present

## 2023-03-10 DIAGNOSIS — I2510 Atherosclerotic heart disease of native coronary artery without angina pectoris: Secondary | ICD-10-CM | POA: Diagnosis present

## 2023-03-10 DIAGNOSIS — K219 Gastro-esophageal reflux disease without esophagitis: Secondary | ICD-10-CM | POA: Diagnosis present

## 2023-03-10 DIAGNOSIS — Z96641 Presence of right artificial hip joint: Secondary | ICD-10-CM | POA: Diagnosis present

## 2023-03-10 DIAGNOSIS — I69351 Hemiplegia and hemiparesis following cerebral infarction affecting right dominant side: Secondary | ICD-10-CM | POA: Diagnosis not present

## 2023-03-10 DIAGNOSIS — J449 Chronic obstructive pulmonary disease, unspecified: Secondary | ICD-10-CM | POA: Diagnosis present

## 2023-03-10 DIAGNOSIS — I480 Paroxysmal atrial fibrillation: Secondary | ICD-10-CM

## 2023-03-10 DIAGNOSIS — I63542 Cerebral infarction due to unspecified occlusion or stenosis of left cerebellar artery: Secondary | ICD-10-CM | POA: Diagnosis present

## 2023-03-10 DIAGNOSIS — I639 Cerebral infarction, unspecified: Principal | ICD-10-CM | POA: Diagnosis present

## 2023-03-10 DIAGNOSIS — Z751 Person awaiting admission to adequate facility elsewhere: Secondary | ICD-10-CM

## 2023-03-10 DIAGNOSIS — I110 Hypertensive heart disease with heart failure: Secondary | ICD-10-CM | POA: Diagnosis present

## 2023-03-10 DIAGNOSIS — Z85118 Personal history of other malignant neoplasm of bronchus and lung: Secondary | ICD-10-CM

## 2023-03-10 DIAGNOSIS — R296 Repeated falls: Secondary | ICD-10-CM | POA: Diagnosis present

## 2023-03-10 DIAGNOSIS — I5032 Chronic diastolic (congestive) heart failure: Secondary | ICD-10-CM | POA: Diagnosis present

## 2023-03-10 DIAGNOSIS — F32A Depression, unspecified: Secondary | ICD-10-CM | POA: Diagnosis present

## 2023-03-10 DIAGNOSIS — N401 Enlarged prostate with lower urinary tract symptoms: Secondary | ICD-10-CM | POA: Diagnosis present

## 2023-03-10 DIAGNOSIS — R531 Weakness: Secondary | ICD-10-CM | POA: Diagnosis present

## 2023-03-10 DIAGNOSIS — I10 Essential (primary) hypertension: Secondary | ICD-10-CM | POA: Diagnosis present

## 2023-03-10 DIAGNOSIS — Z83438 Family history of other disorder of lipoprotein metabolism and other lipidemia: Secondary | ICD-10-CM | POA: Diagnosis not present

## 2023-03-10 DIAGNOSIS — R3916 Straining to void: Secondary | ICD-10-CM | POA: Diagnosis not present

## 2023-03-10 DIAGNOSIS — D6481 Anemia due to antineoplastic chemotherapy: Secondary | ICD-10-CM | POA: Diagnosis present

## 2023-03-10 DIAGNOSIS — R5381 Other malaise: Secondary | ICD-10-CM | POA: Diagnosis present

## 2023-03-10 DIAGNOSIS — T451X5A Adverse effect of antineoplastic and immunosuppressive drugs, initial encounter: Secondary | ICD-10-CM | POA: Diagnosis present

## 2023-03-10 LAB — URINALYSIS, ROUTINE W REFLEX MICROSCOPIC
Bilirubin Urine: NEGATIVE
Glucose, UA: NEGATIVE mg/dL
Hgb urine dipstick: NEGATIVE
Ketones, ur: NEGATIVE mg/dL
Leukocytes,Ua: NEGATIVE
Nitrite: NEGATIVE
Protein, ur: NEGATIVE mg/dL
Specific Gravity, Urine: 1.031 — ABNORMAL HIGH (ref 1.005–1.030)
pH: 7 (ref 5.0–8.0)

## 2023-03-10 LAB — CBC WITH DIFFERENTIAL/PLATELET
Abs Immature Granulocytes: 0.03 10*3/uL (ref 0.00–0.07)
Basophils Absolute: 0.1 10*3/uL (ref 0.0–0.1)
Basophils Relative: 1 %
Eosinophils Absolute: 0.5 10*3/uL (ref 0.0–0.5)
Eosinophils Relative: 6 %
HCT: 36.8 % — ABNORMAL LOW (ref 39.0–52.0)
Hemoglobin: 11.8 g/dL — ABNORMAL LOW (ref 13.0–17.0)
Immature Granulocytes: 0 %
Lymphocytes Relative: 15 %
Lymphs Abs: 1.1 10*3/uL (ref 0.7–4.0)
MCH: 30.8 pg (ref 26.0–34.0)
MCHC: 32.1 g/dL (ref 30.0–36.0)
MCV: 96.1 fL (ref 80.0–100.0)
Monocytes Absolute: 0.6 10*3/uL (ref 0.1–1.0)
Monocytes Relative: 8 %
Neutro Abs: 5 10*3/uL (ref 1.7–7.7)
Neutrophils Relative %: 70 %
Platelets: 163 10*3/uL (ref 150–400)
RBC: 3.83 MIL/uL — ABNORMAL LOW (ref 4.22–5.81)
RDW: 13.1 % (ref 11.5–15.5)
WBC: 7.3 10*3/uL (ref 4.0–10.5)
nRBC: 0 % (ref 0.0–0.2)

## 2023-03-10 LAB — COMPREHENSIVE METABOLIC PANEL
ALT: 7 U/L (ref 0–44)
AST: 19 U/L (ref 15–41)
Albumin: 3.3 g/dL — ABNORMAL LOW (ref 3.5–5.0)
Alkaline Phosphatase: 77 U/L (ref 38–126)
Anion gap: 10 (ref 5–15)
BUN: 19 mg/dL (ref 8–23)
CO2: 26 mmol/L (ref 22–32)
Calcium: 9.2 mg/dL (ref 8.9–10.3)
Chloride: 106 mmol/L (ref 98–111)
Creatinine, Ser: 1.43 mg/dL — ABNORMAL HIGH (ref 0.61–1.24)
GFR, Estimated: 51 mL/min — ABNORMAL LOW (ref >60)
Glucose, Bld: 99 mg/dL (ref 70–99)
Potassium: 5.2 mmol/L — ABNORMAL HIGH (ref 3.5–5.1)
Sodium: 142 mmol/L (ref 135–145)
Total Bilirubin: 0.7 mg/dL (ref 0.0–1.2)
Total Protein: 6.9 g/dL (ref 6.5–8.1)

## 2023-03-10 LAB — CBG MONITORING, ED: Glucose-Capillary: 51 mg/dL — ABNORMAL LOW (ref 70–99)

## 2023-03-10 LAB — POTASSIUM: Potassium: 4.3 mmol/L (ref 3.5–5.1)

## 2023-03-10 MED ORDER — STROKE: EARLY STAGES OF RECOVERY BOOK
Freq: Once | Status: AC
  Filled 2023-03-10: qty 1

## 2023-03-10 MED ORDER — ACETAMINOPHEN 325 MG PO TABS
650.0000 mg | ORAL_TABLET | ORAL | Status: DC | PRN
  Administered 2023-03-11 – 2023-03-13 (×4): 650 mg via ORAL
  Filled 2023-03-10 (×4): qty 2

## 2023-03-10 MED ORDER — IOHEXOL 350 MG/ML SOLN
75.0000 mL | Freq: Once | INTRAVENOUS | Status: AC | PRN
  Administered 2023-03-10: 75 mL via INTRAVENOUS

## 2023-03-10 MED ORDER — ACETAMINOPHEN 160 MG/5ML PO SOLN: 650.0000 mg | ORAL | Status: DC | PRN

## 2023-03-10 MED ORDER — ACETAMINOPHEN 650 MG RE SUPP: 650.0000 mg | RECTAL | Status: DC | PRN

## 2023-03-10 MED ORDER — ENOXAPARIN SODIUM 40 MG/0.4ML IJ SOSY
40.0000 mg | PREFILLED_SYRINGE | INTRAMUSCULAR | Status: DC
  Administered 2023-03-10: 40 mg via SUBCUTANEOUS
  Filled 2023-03-10: qty 0.4

## 2023-03-10 MED ORDER — SENNOSIDES-DOCUSATE SODIUM 8.6-50 MG PO TABS: 1.0000 | ORAL_TABLET | Freq: Every evening | ORAL | Status: DC | PRN

## 2023-03-10 MED ORDER — SODIUM CHLORIDE 0.9 % IV SOLN: INTRAVENOUS | Status: AC

## 2023-03-10 NOTE — ED Provider Notes (Signed)
[place] EMERGENCY DEPARTMENT AT [facility] Provider Note   CSN: [id number] Arrival date & time: 03/10/23  1216     History  Chief Complaint  Patient presents with   Weakness    Darryl Ward is a 78 y.o. 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 Ellin Saba receiving Rande Lawman, hypertension, history of anemia, persistent 1 pack/day smoker was advised to present to the ED secondary to an outpatient brain MRI which was completed 3 days ago, ordered secondary to multiple falls and intermittent dizziness, looking for CVA or metastatic disease.  His MRI confirms a subacute subcentimeter in the left frontal white matter.  Apparently the patient was called and advised to come to the emergency department 3 days ago, but only received the message today, hence his late presentation.  He denies any new focal weakness, he endorses generalized weakness.  Wife at the bedside states since prior CVAs he has had occasional falls with intermittent right sided weakness.  She and the patient both endorse he appears more pale than normal, he has not had a great appetite, has been hydrating, no fevers, headache, chest pain, shortness of breath or other specific complaint.  The history is provided by the patient.       Home Medications Prior to Admission medications   Medication Sig Start Date End Date Taking? Authorizing Provider  albuterol (VENTOLIN HFA) 108 (90 Base) MCG/ACT inhaler Inhale 2 puffs into the lungs every 6 (six) hours as needed for wheezing or shortness of breath. 01/23/22   Leslye Peer, MD  apixaban (ELIQUIS) 5 MG TABS tablet Take 1 tablet (5 mg total) by mouth 2 (two) times daily. 06/19/22   [name], DO  atorvastatin (LIPITOR) 80 MG tablet Take 1 tablet (80 mg total) by mouth daily. 06/19/22   [name], DO  chlorthalidone (HYGROTON) 25 MG tablet 1 tablet   every other day Orally Once a day 10/10/20   [provider]   cyclobenzaprine (FLEXERIL) 10 MG tablet Take 1 tablet (10 mg total) by mouth 2 (two) times daily as needed. 06/19/22   [name], DO  escitalopram (LEXAPRO) 10 MG tablet Take 1 tablet (10 mg total) by mouth daily. 06/19/22   [name], DO  folic acid (KP FOLIC ACID) 1 MG tablet Take 1 tablet (1 mg total) by mouth daily. 06/19/22   [name], DO  furosemide (LASIX) 20 MG tablet Take 1 tablet (20 mg total) by mouth daily as needed. 06/19/22   [name], DO  magnesium oxide (MAG-OX) 400 (240 Mg) MG tablet Take 1 tablet (400 mg total) by mouth daily. 06/19/22   [name], DO  metoprolol succinate (TOPROL-XL) 25 MG 24 hr tablet Take 1 tablet (25 mg total) by mouth daily. 02/04/23   [name], DO  pantoprazole (PROTONIX) 40 MG tablet Take 1 tablet (40 mg total) by mouth daily. 06/19/22   [name], DO  PEMBROLIZUMAB IV Inject 200 mg into the vein every 21 ( twenty-one) days.    [provider]  tamsulosin (FLOMAX) 0.4 MG CAPS capsule Take 1 capsule (0.4 mg total) by mouth daily. 06/12/22   [name], DO  Tiotropium Bromide Monohydrate (SPIRIVA RESPIMAT) 2.5 MCG/ACT AERS Inhale 2 puffs into the lungs daily. 06/19/22   [name], DO      Allergies    Patient has no known allergies.    Review of  Systems   Review of Systems  Constitutional:  Positive for fatigue. Negative for fever.  HENT:  Negative for congestion and sore throat.   Eyes: Negative.  Negative for visual disturbance.  Respiratory:  Negative for chest tightness and shortness of breath.   Cardiovascular:  Negative for chest pain.  Gastrointestinal:  Negative for abdominal pain and nausea.  Genitourinary: Negative.   Musculoskeletal:  Negative for arthralgias, joint swelling and neck pain.  Skin: Negative.  Negative for rash and wound.  Neurological:  Positive for weakness. Negative for dizziness, light-headedness, numbness and headaches.  Psychiatric/Behavioral: Negative.      Physical  Exam Updated Vital Signs BP (!) 142/74 (BP Location: Right Arm)   Pulse 60   Temp 97.9 F (36.6 C) (Oral)   Resp 18   Ht 5\' 10"  (1.778 m)   Wt 84.2 kg   SpO2 99%   BMI 26.63 kg/m  Physical Exam Vitals and nursing note reviewed.  Constitutional:      Appearance: He is well-developed.  HENT:     Head: Normocephalic and atraumatic.     Right Ear: Tympanic membrane normal.     Left Ear: Tympanic membrane normal.  Eyes:     Extraocular Movements: Extraocular movements intact.     Pupils: Pupils are equal, round, and reactive to light.  Cardiovascular:     Rate and Rhythm: Normal rate.     Heart sounds: Normal heart sounds.  Pulmonary:     Effort: Pulmonary effort is normal.  Abdominal:     Palpations: Abdomen is soft.     Tenderness: There is no abdominal tenderness.  Musculoskeletal:        General: Normal range of motion.     Cervical back: Normal range of motion and neck supple.  Lymphadenopathy:     Cervical: No cervical adenopathy.  Skin:    General: Skin is warm and dry.     Findings: No rash.  Neurological:     General: No focal deficit present.     Mental Status: He is alert and oriented to person, place, and time.     GCS: GCS eye subscore is 4. GCS verbal subscore is 5. GCS motor subscore is 6.     Cranial Nerves: No cranial nerve deficit.     Sensory: Sensation is intact. No sensory deficit.     Motor: Motor function is intact. No pronator drift.     Coordination: Coordination normal. Finger-Nose-Finger Test normal.     Gait: Gait normal.     Deep Tendon Reflexes: Reflexes normal.     Comments: Unable to perform heel/shin secondary to right knee pain since last fall.  Normal rapid alternating movements. Cranial nerves III-XII intact.  No pronator drift.   Psychiatric:        Speech: Speech normal.        Behavior: Behavior normal.        Thought Content: Thought content normal.     ED Results / Procedures / Treatments   Labs (all labs ordered are  listed, but only abnormal results are displayed) Labs Reviewed  COMPREHENSIVE METABOLIC PANEL - Abnormal; Notable for the following components:      Result Value   Potassium 5.2 (*)    Creatinine, Ser 1.43 (*)    Albumin 3.3 (*)    GFR, Estimated 51 (*)    All other components within normal limits  CBC WITH DIFFERENTIAL/PLATELET - Abnormal; Notable for the following components:   RBC 3.83 (*)  Hemoglobin 11.8 (*)    HCT 36.8 (*)    All other components within normal limits  POTASSIUM  URINALYSIS, ROUTINE W REFLEX MICROSCOPIC  CBG MONITORING, ED    EKG None  Radiology DG Chest Portable 1 View Result Date: 03/10/2023 CLINICAL DATA:  Weakness and frequent falls with new finding of left frontal infarction. EXAM: PORTABLE CHEST 1 VIEW COMPARISON:  Chest radiograph dated 02/26/2023 FINDINGS: Lines/tubes: Left chest wall port tip projects over the superior cavoatrial junction. Lungs: Well inflated lungs. Unchanged elevation of the right hemidiaphragm with unchanged right apical density. Postsurgical changes of the right upper lung. Unchanged left lower lung cyst. No new focal consolidations. Pleura: No pneumothorax or pleural effusion. Heart/mediastinum: The heart size and mediastinal contours are within normal limits. Bones: No acute osseous abnormality. IMPRESSION: 1. No acute disease. 2. Unchanged elevation of the right hemidiaphragm with unchanged right apical density. Electronically Signed   By: Agustin Cree M.D.   On: 03/10/2023 16:26    Results for orders placed or performed during the hospital encounter of 03/10/23  Comprehensive metabolic panel   Collection Time: 03/10/23  2:23 PM  Result Value Ref Range   Sodium 142 135 - 145 mmol/L   Potassium 5.2 (H) 3.5 - 5.1 mmol/L   Chloride 106 98 - 111 mmol/L   CO2 26 22 - 32 mmol/L   Glucose, Bld 99 70 - 99 mg/dL   BUN 19 8 - 23 mg/dL   Creatinine, Ser 4.09 (H) 0.61 - 1.24 mg/dL   Calcium 9.2 8.9 - 81.1 mg/dL   Total Protein 6.9 6.5 -  8.1 g/dL   Albumin 3.3 (L) 3.5 - 5.0 g/dL   AST 19 15 - 41 U/L   ALT 7 0 - 44 U/L   Alkaline Phosphatase 77 38 - 126 U/L   Total Bilirubin 0.7 0.0 - 1.2 mg/dL   GFR, Estimated 51 (L) >60 mL/min   Anion gap 10 5 - 15  CBC with Differential   Collection Time: 03/10/23  2:23 PM  Result Value Ref Range   WBC 7.3 4.0 - 10.5 K/uL   RBC 3.83 (L) 4.22 - 5.81 MIL/uL   Hemoglobin 11.8 (L) 13.0 - 17.0 g/dL   HCT 91.4 (L) 78.2 - 95.6 %   MCV 96.1 80.0 - 100.0 fL   MCH 30.8 26.0 - 34.0 pg   MCHC 32.1 30.0 - 36.0 g/dL   RDW 21.3 08.6 - 57.8 %   Platelets 163 150 - 400 K/uL   nRBC 0.0 0.0 - 0.2 %   Neutrophils Relative % 70 %   Neutro Abs 5.0 1.7 - 7.7 K/uL   Lymphocytes Relative 15 %   Lymphs Abs 1.1 0.7 - 4.0 K/uL   Monocytes Relative 8 %   Monocytes Absolute 0.6 0.1 - 1.0 K/uL   Eosinophils Relative 6 %   Eosinophils Absolute 0.5 0.0 - 0.5 K/uL   Basophils Relative 1 %   Basophils Absolute 0.1 0.0 - 0.1 K/uL   Immature Granulocytes 0 %   Abs Immature Granulocytes 0.03 0.00 - 0.07 K/uL  Potassium   Collection Time: 03/10/23  5:11 PM  Result Value Ref Range   Potassium 4.3 3.5 - 5.1 mmol/L   DG Chest Portable 1 View Result Date: 03/10/2023 CLINICAL DATA:  Weakness and frequent falls with new finding of left frontal infarction. EXAM: PORTABLE CHEST 1 VIEW COMPARISON:  Chest radiograph dated 02/26/2023 FINDINGS: Lines/tubes: Left chest wall port tip projects over the superior cavoatrial junction. Lungs:  Well inflated lungs. Unchanged elevation of the right hemidiaphragm with unchanged right apical density. Postsurgical changes of the right upper lung. Unchanged left lower lung cyst. No new focal consolidations. Pleura: No pneumothorax or pleural effusion. Heart/mediastinum: The heart size and mediastinal contours are within normal limits. Bones: No acute osseous abnormality. IMPRESSION: 1. No acute disease. 2. Unchanged elevation of the right hemidiaphragm with unchanged right apical density.  Electronically Signed   By: Agustin Cree M.D.   On: 03/10/2023 16:26   MR Brain W Wo Contrast Result Date: 03/07/2023 CLINICAL DATA:  Dizziness, imbalance, and falls. History of non-small cell lung cancer. EXAM: MRI HEAD WITHOUT AND WITH CONTRAST TECHNIQUE: Multiplanar, multiecho pulse sequences of the brain and surrounding structures were obtained without and with intravenous contrast. CONTRAST:  7.29mL GADAVIST GADOBUTROL 1 MMOL/ML IV SOLN COMPARISON:  Head CT 02/26/2023 and MRI 05/02/2022 FINDINGS: The study is moderately motion degraded. Brain: There is a 7 mm focus of restricted diffusion and T2 hyperintensity in the subcortical white matter of the left superior frontal gyrus, likely with corresponding hypodensity in this location on the prior CT and most consistent with an early subacute infarct. No acute large territory infarct, intracranial hemorrhage, midline shift, or extra-axial fluid collection is identified. Patchy T2 hyperintensities elsewhere in the cerebral white matter bilaterally are similar to the prior MRI and are nonspecific but compatible with chronic small vessel ischemic disease. Chronic lacunar infarcts are again noted in the deep gray nuclei and cerebral white matter bilaterally. There is moderate cerebral atrophy. A 2.5 x 1.6 cm meningioma along the right sphenoid wing has not significantly changed in size from a 07/11/2021 MRI. No other enhancing intracranial lesion is identified within limitations of motion artifact. Vascular: Major intracranial vascular flow voids are preserved. Skull and upper cervical spine: Unremarkable bone marrow signal. Sinuses/Orbits: Bilateral cataract extraction. Clear paranasal sinuses. Moderate bilateral mastoid effusions. Other: None. These results will be called to the ordering clinician or representative by the Radiologist Assistant, and communication documented in the PACS or [employer]. IMPRESSION: 1. Motion degraded examination. 2. Subcentimeter  early subacute infarct in the left frontal white matter. 3. No evidence of intracranial metastatic disease. 4. Unchanged right sphenoid wing meningioma. 5. 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 Electronically Signed   By: [name] Ache M.D.   On: 03/07/2023 15:40   DG Pelvis Portable Result Date: 02/26/2023 CLINICAL DATA:  Fall.  Pelvic pain. EXAM: PORTABLE PELVIS 1-2 VIEWS; DG HIP (WITH OR WITHOUT PELVIS) 2-3V RIGHT COMPARISON:  None Available. 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 The arthroplasty appears intact. There is no acute fracture or dislocation. L5-S1 fusion. The soft tissues are unremarkable IMPRESSION: 1. No acute fracture or dislocation. 2. Right hip arthroplasty. Electronically Signed   By: Elgie Collard M.D.   On: 02/26/2023 16:10   DG Hip Unilat With Pelvis 2-3 Views Right Result Date: 02/26/2023 CLINICAL DATA:  Fall.  Pelvic pain. EXAM: PORTABLE PELVIS 1-2 VIEWS; DG HIP (WITH OR WITHOUT PELVIS) 2-3V RIGHT COMPARISON:  None Available. 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 The arthroplasty appears intact. There is no acute fracture or dislocation. L5-S1 fusion. The soft tissues are unremarkable IMPRESSION: 1. No acute fracture or dislocation. 2. Right hip arthroplasty. Electronically Signed   By: Elgie Collard M.D.   On: 02/26/2023 16:10   DG Tibia/Fibula Right Result Date: 02/26/2023 CLINICAL DATA:  Larey Seat yesterday with lower leg pain EXAM: RIGHT TIBIA AND FIBULA - 2 VIEW COMPARISON:  None  Available. FINDINGS: There is no evidence of fracture or other focal bone lesions. Soft tissues are unremarkable. IMPRESSION: Negative. Electronically Signed   By: Paulina Fusi M.D.   On: 02/26/2023 16:10   DG Ribs Unilateral W/Chest Left Result Date: 02/26/2023 CLINICAL DATA:  Fall and left chest wall pain. EXAM: LEFT RIBS AND CHEST - 3+ VIEW COMPARISON:  Chest radiograph dated 07/09/2022. FINDINGS: Left-sided Port-A-Cath with tip at the cavoatrial  junction. There is shallow inspiration. Mild interstitial of the right hemidiaphragm. There are bibasilar atelectasis. Postsurgical changes and scarring in the right upper lobe with right apical pleural thickening. No consolidative changes. There is no pleural effusion or pneumothorax. The cardiac silhouette is within normal limits. Atherosclerotic calcification of the aorta. Mildly displaced fracture of the lateral left ninth and possibly eighth ribs. IMPRESSION: 1. Mildly displaced fracture of the lateral left ninth and possibly eighth ribs. No pneumothorax. 2. Bibasilar atelectasis. Electronically Signed   By: Elgie Collard M.D.   On: 02/26/2023 16:08   DG Knee Complete 4 Views Right Result Date: 02/26/2023 CLINICAL DATA:  Fall EXAM: RIGHT KNEE - COMPLETE 4+ VIEW COMPARISON:  None Available. FINDINGS: No evidence of fracture, dislocation, or joint effusion. No evidence of arthropathy or other focal bone abnormality. Peripheral vascular calcifications are present. Soft tissues are unremarkable. IMPRESSION: 1. No acute fracture or dislocation. 2. Peripheral vascular calcifications. Electronically Signed   By: Darliss Cheney M.D.   On: 02/26/2023 16:05   CT Head Wo Contrast Result Date: 02/26/2023 CLINICAL DATA:  Provided history: Head trauma, minor. Neck trauma. Multiple falls. EXAM: CT HEAD WITHOUT CONTRAST CT CERVICAL SPINE WITHOUT CONTRAST TECHNIQUE: Multidetector CT imaging of the head and cervical spine was performed following the standard protocol without intravenous contrast. Multiplanar CT image reconstructions of the cervical spine were also generated. RADIATION DOSE REDUCTION: This exam was performed according to the departmental dose-optimization program which includes automated exposure control, adjustment of the mA and/or kV according to patient size and/or use of iterative reconstruction technique. COMPARISON:  Prior brain MRI examinations 05/02/2022 and earlier. Head CT 05/01/2022. CT angiogram  head/neck 02/04/2020. FINDINGS: CT HEAD FINDINGS: Brain: Generalized cerebral and cerebellar atrophy. Chronic infarcts within the bilateral cerebral hemispheric white matter and within/about the bilateral basal ganglia, some of which were better appreciated on prior brain MRI examinations. Moderate patchy and ill-defined hypoattenuation elsewhere within the cerebral white matter, nonspecific but compatible with chronic small vessel ischemic disease. A meningioma along the right sphenoid wing has not appreciably changed in size since the brain MRI of 07/11/2021, again measuring 2.5 x 1.7 cm on axial slices (for instance as seen on series 2, image 9). As before, the mass focally indents the underlying right temporal lobe. There is no acute intracranial hemorrhage. No demarcated cortical infarct. No extra-axial fluid collection. No midline shift. Vascular: No hyperdense vessel.  Atherosclerotic calcifications. Skull: No calvarial fracture or aggressive osseous lesion. Sinuses/Orbits: No mass or acute finding within the imaged orbits. Minimal mucosal thickening scattered within the paranasal sinuses at the imaged levels. Other: Bilateral mastoid effusions. CT CERVICAL SPINE FINDINGS Alignment: 2 mm C7-T1 grade 1 anterolisthesis. Skull base and vertebrae: The basion-dental and atlanto-dental intervals are maintained.No evidence of acute fracture to the cervical spine. Soft tissues and spinal canal: No prevertebral fluid or swelling. No visible canal hematoma. Disc levels: Cervical spondylosis with multilevel disc space narrowing, disc bulges/central disc protrusions, uncovertebral hypertrophy and facet arthrosis. Disc space narrowing is greatest at C6-C7 (advanced at this level). Multilevel spinal canal narrowing.  Most notably at C6-C7, a posterior disc osteophyte complex contributes to apparent mild/moderate spinal canal stenosis. Multilevel bony neural foraminal narrowing. Upper chest: No acute finding at the imaged  levels. Partially imaged left-sided central venous catheter. Other: 10 mm nodule within the left parotid gland inferiorly (series 4, image 40). IMPRESSION: CT head: 1. No evidence of an acute intracranial abnormality. 2. 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 3. Parenchymal atrophy, chronic small vessel ischemic disease and chronic infarcts, as described. 4. Minor paranasal sinus mucosal thickening at the imaged levels. 5. Bilateral mastoid effusions. CT cervical spine: 1. No evidence of an acute cervical spine fracture. 2. Mild C7-T1 grade 1 anterolisthesis. 3. Cervical spondylosis as described. 4. 10 mm nodule within the left parotid gland inferiorly suspicious for a primary parotid neoplasm. This could reflect the same lesion described on the prior CTA head/neck of 02/04/2020 or a new lesion. Correlate with medical/procedural history and consider ENT referral. Electronically Signed   By: Jackey Loge D.O.   On: 02/26/2023 15:42   CT Cervical Spine Wo Contrast Result Date: 02/26/2023 CLINICAL DATA:  Provided history: Head trauma, minor. Neck trauma. Multiple falls. EXAM: CT HEAD WITHOUT CONTRAST CT CERVICAL SPINE WITHOUT CONTRAST TECHNIQUE: Multidetector CT imaging of the head and cervical spine was performed following the standard protocol without intravenous contrast. Multiplanar CT image reconstructions of the cervical spine were also generated. RADIATION DOSE REDUCTION: This exam was performed according to the departmental dose-optimization program which includes automated exposure control, adjustment of the mA and/or kV according to patient size and/or use of iterative reconstruction technique. COMPARISON:  Prior brain MRI examinations 05/02/2022 and earlier. Head CT 05/01/2022. CT angiogram head/neck 02/04/2020. FINDINGS: CT HEAD FINDINGS: Brain: Generalized cerebral and cerebellar atrophy. Chronic infarcts within the bilateral  cerebral hemispheric white matter and within/about the bilateral basal ganglia, some of which were better appreciated on prior brain MRI examinations. Moderate patchy and ill-defined hypoattenuation elsewhere within the cerebral white matter, nonspecific but compatible with chronic small vessel ischemic disease. A meningioma along the right sphenoid wing has not appreciably changed in size since the brain MRI of 07/11/2021, again measuring 2.5 x 1.7 cm on axial slices (for instance as seen on series 2, image 9). As before, the mass focally indents the underlying right temporal lobe. There is no acute intracranial hemorrhage. No demarcated cortical infarct. No extra-axial fluid collection. No midline shift. Vascular: No hyperdense vessel.  Atherosclerotic calcifications. Skull: No calvarial fracture or aggressive osseous lesion. Sinuses/Orbits: No mass or acute finding within the imaged orbits. Minimal mucosal thickening scattered within the paranasal sinuses at the imaged levels. Other: Bilateral mastoid effusions. CT CERVICAL SPINE FINDINGS Alignment: 2 mm C7-T1 grade 1 anterolisthesis. Skull base and vertebrae: The basion-dental and atlanto-dental intervals are maintained.No evidence of acute fracture to the cervical spine. Soft tissues and spinal canal: No prevertebral fluid or swelling. No visible canal hematoma. Disc levels: Cervical spondylosis with multilevel disc space narrowing, disc bulges/central disc protrusions, uncovertebral hypertrophy and facet arthrosis. Disc space narrowing is greatest at C6-C7 (advanced at this level). Multilevel spinal canal narrowing. Most notably at C6-C7, a posterior disc osteophyte complex contributes to apparent mild/moderate spinal canal stenosis. Multilevel bony neural foraminal narrowing. Upper chest: No acute finding at the imaged levels. Partially imaged left-sided central venous catheter. Other: 10 mm nodule within the left parotid gland inferiorly (series 4, image  40). IMPRESSION: CT head: 1. No evidence of an  acute intracranial abnormality. 2. 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 3. Parenchymal atrophy, chronic small vessel ischemic disease and chronic infarcts, as described. 4. Minor paranasal sinus mucosal thickening at the imaged levels. 5. Bilateral mastoid effusions. CT cervical spine: 1. No evidence of an acute cervical spine fracture. 2. Mild C7-T1 grade 1 anterolisthesis. 3. Cervical spondylosis as described. 4. 10 mm nodule within the left parotid gland inferiorly suspicious for a primary parotid neoplasm. This could reflect the same lesion described on the prior CTA head/neck of 02/04/2020 or a new lesion. Correlate with medical/procedural history and consider ENT referral. Electronically Signed   By: Jackey Loge D.O.   On: 02/26/2023 15:42    Procedures Procedures    Medications Ordered in ED Medications  iohexol (OMNIPAQUE) 350 MG/ML injection 75 mL (75 mLs Intravenous Contrast Given 03/10/23 1740)    ED Course/ Medical Decision Making/ A&P                                 Medical Decision Making Patient presenting with a subacute subcentimeter CVA frontal lobe, he is on Eliquis, has a history of prior CVAs, he has no focal neurodeficit on today's exam but has had trips and falls which have been worse this past week.  However wife at bedside does state since his original CVA he has had increased falls as well and has intermittent right sided lower extremity weakness chronically.  His labs today are unremarkable, he does have a mild anemia with a hemoglobin of 11.8, however this is relatively stable.  His initial potassium is 5.2, repeat it is normal at 4.3, he does have some renal insufficiency with a creatinine of 1.43 relatively stable number.  Amount and/or Complexity of Data Reviewed Labs: ordered. Radiology: ordered.    Details: Chest x-ray reviewed, no acute  changes. Discussion of management or test interpretation with external provider(s): Patient with subacute CVA subcentimeter which was present on MRI 3 days ago.  Discussed with Dr. Otelia Limes of neurology, recommends CTA head and neck, admission, continue his Eliquis and plan for Dr. Melynda Ripple to see patient in the morning for formal neurology consult.  Discussed with Dr. Arville Care who accepts pt for admission.  Risk Decision regarding hospitalization.           Final Clinical Impression(s) / ED Diagnoses Final diagnoses:  Cerebrovascular accident (CVA), unspecified mechanism [facility])    Rx / DC Orders ED Discharge Orders     None         Victoriano Lain 03/10/23 Carlis Stable    Gloris Manchester, MD 03/11/23 [phone]

## 2023-03-10 NOTE — ED Triage Notes (Signed)
Pt arrived via POV c/o weakness, frequent falls and reports receiving a phone call regarding his recent MRI results and was told he has a small infarct in his left lobe of his brain. Pt is on Eliquis. Pt presents more pale than usual.

## 2023-03-11 DIAGNOSIS — I10 Essential (primary) hypertension: Secondary | ICD-10-CM | POA: Diagnosis not present

## 2023-03-11 DIAGNOSIS — K219 Gastro-esophageal reflux disease without esophagitis: Secondary | ICD-10-CM

## 2023-03-11 DIAGNOSIS — F32A Depression, unspecified: Secondary | ICD-10-CM | POA: Diagnosis not present

## 2023-03-11 DIAGNOSIS — R3916 Straining to void: Secondary | ICD-10-CM

## 2023-03-11 DIAGNOSIS — I480 Paroxysmal atrial fibrillation: Secondary | ICD-10-CM

## 2023-03-11 DIAGNOSIS — J449 Chronic obstructive pulmonary disease, unspecified: Secondary | ICD-10-CM

## 2023-03-11 DIAGNOSIS — R296 Repeated falls: Secondary | ICD-10-CM

## 2023-03-11 DIAGNOSIS — N401 Enlarged prostate with lower urinary tract symptoms: Secondary | ICD-10-CM

## 2023-03-11 DIAGNOSIS — Z85118 Personal history of other malignant neoplasm of bronchus and lung: Secondary | ICD-10-CM

## 2023-03-11 DIAGNOSIS — I639 Cerebral infarction, unspecified: Secondary | ICD-10-CM | POA: Diagnosis not present

## 2023-03-11 LAB — VITAMIN B12: Vitamin B-12: 232 pg/mL (ref 180–914)

## 2023-03-11 LAB — LIPID PANEL
Cholesterol: 84 mg/dL (ref 0–200)
HDL: 28 mg/dL — ABNORMAL LOW (ref >40)
LDL Cholesterol: 37 mg/dL (ref 0–99)
Total CHOL/HDL Ratio: 3 {ratio}
Triglycerides: 94 mg/dL (ref <150)
VLDL: 19 mg/dL (ref 0–40)

## 2023-03-11 LAB — HEMOGLOBIN A1C
Hgb A1c MFr Bld: 5.5 % (ref 4.8–5.6)
Mean Plasma Glucose: 111.15 mg/dL

## 2023-03-11 MED ORDER — ATORVASTATIN CALCIUM 40 MG PO TABS
80.0000 mg | ORAL_TABLET | Freq: Every day | ORAL | Status: DC
Start: 2023-03-11 — End: 2023-03-14
  Administered 2023-03-11 – 2023-03-14 (×4): 80 mg via ORAL
  Filled 2023-03-11 (×4): qty 2

## 2023-03-11 MED ORDER — MECLIZINE HCL 12.5 MG PO TABS: 25.0000 mg | ORAL_TABLET | Freq: Three times a day (TID) | ORAL | Status: DC | PRN

## 2023-03-11 MED ORDER — TIOTROPIUM BROMIDE MONOHYDRATE 2.5 MCG/ACT IN AERS
2.0000 | INHALATION_SPRAY | Freq: Every day | RESPIRATORY_TRACT | Status: DC
Start: 2023-03-11 — End: 2023-03-11

## 2023-03-11 MED ORDER — FOLIC ACID 1 MG PO TABS
1.0000 mg | ORAL_TABLET | Freq: Every day | ORAL | Status: DC
  Administered 2023-03-11 – 2023-03-14 (×4): 1 mg via ORAL
  Filled 2023-03-11 (×4): qty 1

## 2023-03-11 MED ORDER — FUROSEMIDE 20 MG PO TABS: 20.0000 mg | ORAL_TABLET | Freq: Every day | ORAL | Status: DC | PRN

## 2023-03-11 MED ORDER — APIXABAN 5 MG PO TABS
5.0000 mg | ORAL_TABLET | Freq: Two times a day (BID) | ORAL | Status: DC
  Administered 2023-03-11 – 2023-03-14 (×7): 5 mg via ORAL
  Filled 2023-03-11 (×7): qty 1

## 2023-03-11 MED ORDER — UMECLIDINIUM BROMIDE 62.5 MCG/ACT IN AEPB
1.0000 | INHALATION_SPRAY | Freq: Every day | RESPIRATORY_TRACT | Status: DC
  Administered 2023-03-12 – 2023-03-14 (×3): 1 via RESPIRATORY_TRACT
  Filled 2023-03-11: qty 7

## 2023-03-11 MED ORDER — ALBUTEROL SULFATE (2.5 MG/3ML) 0.083% IN NEBU: 3.0000 mL | INHALATION_SOLUTION | Freq: Four times a day (QID) | RESPIRATORY_TRACT | Status: DC | PRN

## 2023-03-11 MED ORDER — CHLORTHALIDONE 25 MG PO TABS
25.0000 mg | ORAL_TABLET | Freq: Every day | ORAL | Status: DC
  Administered 2023-03-11 – 2023-03-14 (×4): 25 mg via ORAL
  Filled 2023-03-11 (×4): qty 1

## 2023-03-11 MED ORDER — METOPROLOL SUCCINATE ER 25 MG PO TB24
25.0000 mg | ORAL_TABLET | Freq: Every day | ORAL | Status: DC
  Administered 2023-03-11 – 2023-03-14 (×4): 25 mg via ORAL
  Filled 2023-03-11 (×4): qty 1

## 2023-03-11 MED ORDER — CYCLOBENZAPRINE HCL 10 MG PO TABS: 10.0000 mg | ORAL_TABLET | Freq: Two times a day (BID) | ORAL | Status: DC | PRN

## 2023-03-11 MED ORDER — MAGNESIUM OXIDE -MG SUPPLEMENT 400 (240 MG) MG PO TABS
400.0000 mg | ORAL_TABLET | Freq: Every day | ORAL | Status: DC
  Administered 2023-03-11 – 2023-03-14 (×4): 400 mg via ORAL
  Filled 2023-03-11 (×4): qty 1

## 2023-03-11 MED ORDER — PANTOPRAZOLE SODIUM 40 MG PO TBEC
40.0000 mg | DELAYED_RELEASE_TABLET | Freq: Every day | ORAL | Status: DC
Start: 2023-03-11 — End: 2023-03-14
  Administered 2023-03-11 – 2023-03-14 (×4): 40 mg via ORAL
  Filled 2023-03-11 (×4): qty 1

## 2023-03-11 MED ORDER — CYANOCOBALAMIN 1000 MCG/ML IJ SOLN
1000.0000 ug | Freq: Once | INTRAMUSCULAR | Status: AC
  Administered 2023-03-11: 1000 ug via INTRAMUSCULAR
  Filled 2023-03-11: qty 1

## 2023-03-11 MED ORDER — ESCITALOPRAM OXALATE 10 MG PO TABS
10.0000 mg | ORAL_TABLET | Freq: Every day | ORAL | Status: DC
  Administered 2023-03-11 – 2023-03-14 (×4): 10 mg via ORAL
  Filled 2023-03-11 (×4): qty 1

## 2023-03-11 MED ORDER — TAMSULOSIN HCL 0.4 MG PO CAPS
0.4000 mg | ORAL_CAPSULE | Freq: Every day | ORAL | Status: DC
Start: 2023-03-11 — End: 2023-03-14
  Administered 2023-03-11 – 2023-03-14 (×4): 0.4 mg via ORAL
  Filled 2023-03-11 (×4): qty 1

## 2023-03-11 NOTE — Consult Note (Signed)
I connected with  Darryl Ward on 03/11/23 by a video enabled telemedicine application and verified that I am speaking with the correct person using two identifiers.   I discussed the limitations of evaluation and management by telemedicine. The patient expressed understanding and agreed to proceed.  Location of patient: [facility] External physician: [facility]   Neurology Consultation Reason for Consult: Stroke Referring Physician: Dr. Pilar Grammes  CC: Weakness  History is obtained from: Patient, chart review  HPI: Darryl Ward is a 78 y.o. male with a past medical history of hypertension, atrial fibrillation on Eliquis, lung cancer, hyperlipidemia, coronary artery disease, prior stroke who has been having episodes of weakness and fall and therefore an MRI brain was obtained which showed an acute stroke.  Therefore he was told to come to the emergency room.  Currently patient denies any concerns.  Last known normal: 03/06/2023 Event happened at home No tPA as outside window No thrombectomy as no medicine occlusion mRS 3-4 ( uses cane to walk at times)   ROS: All other systems reviewed and negative except as noted in the HPI.   Past Medical History:  Diagnosis Date   AAA (abdominal aortic aneurysm) (HCC)    Arthritis    Cancer (HCC)    Carotid artery disease (HCC)    Nonobstructive   Cataract    COPD (chronic obstructive pulmonary disease) (HCC)    Coronary atherosclerosis of native coronary artery    PTCA small diagonal 2007 otherwise nonobstructive CAD   Depression    Dysrhythmia    Essential hypertension, benign    Hyperlipidemia    NSTEMI (non-ST elevated myocardial infarction) (HCC) 2007   Stroke [facility]) 2004   TIA (transient ischemic attack) 2006    Family History  Problem Relation Age of Onset   Hyperlipidemia Sister    Heart attack Brother 90   Cancer - Colon Neg Hx     Social History:  reports that he quit smoking about 19 months  ago. His smoking use included cigarettes. He started smoking about 41 years ago. He has a 40 pack-year smoking history. He has never used smokeless tobacco. He reports that he does not drink alcohol and does not use drugs.     Exam: Current vital signs: BP 136/67   Pulse 73   Temp 97.8 F (36.6 C) (Oral)   Resp 15   Ht 5\' 10"  (1.778 m)   Wt 84.2 kg   SpO2 98%   BMI 26.63 kg/m  Vital signs in last 24 hours: Temp:  [97.8 F (36.6 C)-98.5 F (36.9 C)] 97.8 F (36.6 C) (02/18 0716) Pulse Rate:  [60-76] 73 (02/18 0530) Resp:  [11-24] 15 (02/18 0716) BP: (102-142)/(61-85) 136/67 (02/18 0716) SpO2:  [94 %-100 %] 98 % (02/18 0716) Weight:  [84.2 kg] 84.2 kg (02/17 1240)   Physical Exam  Constitutional: Appears well-developed and well-nourished.  Psych: Affect appropriate to situation Neuro: AOx3, able to name 3 out of 5 objects, follows commands, cranial nerves appear grossly intact, antigravity strength without drift in all 4 extremities, sensation intact to light touch, FTN intact bilaterally  NIHSS 0   I have reviewed labs in epic and the results pertinent to this consultation are: CBC:  Recent Labs  Lab 03/06/23 1248 03/10/23 1423  WBC 7.4 7.3  NEUTROABS 5.5 5.0  HGB 11.7* 11.8*  HCT 34.7* 36.8*  MCV 94.3 96.1  PLT 159 163    Basic Metabolic Panel:  Lab Results  Component Value Date   NA 142 03/10/2023   K 4.3 03/10/2023   CO2 26 03/10/2023   GLUCOSE 99 03/10/2023   BUN 19 03/10/2023   CREATININE 1.43 (H) 03/10/2023   CALCIUM 9.2 03/10/2023   GFRNONAA 51 (L) 03/10/2023   GFRAA 89 02/15/2020   Lipid Panel:  Lab Results  Component Value Date   LDLCALC 37 03/11/2023   HgbA1c:  Lab Results  Component Value Date   HGBA1C 5.1 02/05/2020   Urine Drug Screen:     Component Value Date/Time   LABOPIA POSITIVE (A) 05/01/2022 2125   COCAINSCRNUR NONE DETECTED 05/01/2022 2125   LABBENZ NONE DETECTED 05/01/2022 2125   AMPHETMU NONE DETECTED 05/01/2022 2125    THCU NONE DETECTED 05/01/2022 2125   LABBARB NONE DETECTED 05/01/2022 2125    Alcohol Level     Component Value Date/Time   ETH <10 05/02/2022 0612     I have reviewed the images obtained:  MRI brain with and without contrast 03/07/2023: Subcentimeter early subacute infarct in the left frontal white matter. No evidence of intracranial metastatic disease. Unchanged right sphenoid wing meningioma. 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  CTA head and neck with and without contrast 03/10/2023:   1.No acute head CT finding. Old infarction in the right basal ganglia. Old infarction or cyst in the left basal ganglia. Chronic small-vessel ischemic changes of the white matter. 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 2. Aortic atherosclerosis. 3. Atherosclerotic disease at both carotid bifurcations but without stenosis on the right. 45% stenosis proximal left ICA. 4. Atherosclerotic disease at both vertebral artery origins. 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 This is a chronic finding. 5. Atherosclerotic disease in both carotid siphon regions with stenosis estimated at 30-50% on both sides. 6. Atherosclerotic disease in the right vertebral artery V4 segment with stenosis estimated at 30%.      ASSESSMENT/PLAN: 78 year old male who had an MRI done because of weakness and falls showed acute ischemic stroke  Acute ischemic stroke (incidental) -Patient symptoms are most likely secondary to chronic deconditioning in the setting of cancer and subsequent treatment.  The stroke is most likely incidental and not causing any symptoms  Recommendations: -Okay to continue Eliquis 5 mg twice daily -Goal blood pressure: Normotension -Smoking cessation counseling -Stroke  education -Continue PT/OT -Defer further workup of weakness to primary team -Discussed plan with Dr. Gwenlyn Perking via secure chat   Thank you for allowing [name] to participate in the care of this patient. If you have any further questions, please contact  me or neurohospitalist.   Lindie Spruce [facility] neurohospitalist

## 2023-03-11 NOTE — Plan of Care (Signed)
  Problem: Acute Rehab PT Goals(only PT should resolve) Goal: Pt Will Go Supine/Side To Sit Outcome: Progressing Flowsheets (Taken 03/11/2023 1352) Pt will go Supine/Side to Sit:  Independently  with modified independence Goal: Patient Will Transfer Sit To/From Stand Outcome: Progressing Flowsheets (Taken 03/11/2023 1352) Patient will transfer sit to/from stand: with minimal assist Goal: Pt Will Transfer Bed To Chair/Chair To Bed Outcome: Progressing Flowsheets (Taken 03/11/2023 1352) Pt will Transfer Bed to Chair/Chair to Bed: with min assist Goal: Pt Will Ambulate Outcome: Progressing Flowsheets (Taken 03/11/2023 1352) Pt will Ambulate:  50 feet  with minimal assist   1:54 PM, 03/11/23 Ocie Bob, MPT Physical Therapist with [facility] 336 [phone] office [phone] mobile phone

## 2023-03-11 NOTE — Assessment & Plan Note (Signed)
 -  We will continue Eliquis and Toprol-XL.

## 2023-03-11 NOTE — Plan of Care (Signed)
  Problem: Education: Goal: Knowledge of General Education information will improve Description: Including pain rating scale, medication(s)/side effects and non-pharmacologic comfort measures Outcome: Progressing   Problem: Health Behavior/Discharge Planning: Goal: Ability to manage health-related needs will improve Outcome: Progressing   Problem: Education: Goal: Knowledge of disease or condition will improve Outcome: Progressing Goal: Knowledge of secondary prevention will improve (MUST DOCUMENT ALL) Outcome: Progressing Goal: Knowledge of patient specific risk factors will improve (DELETE if not current risk factor) Outcome: Progressing

## 2023-03-11 NOTE — Plan of Care (Signed)
   Problem: Education: Goal: Knowledge of General Education information will improve Description Including pain rating scale, medication(s)/side effects and non-pharmacologic comfort measures Outcome: Progressing   Problem: Health Behavior/Discharge Planning: Goal: Ability to manage health-related needs will improve Outcome: Progressing

## 2023-03-11 NOTE — H&P (Addendum)
[place]   PATIENT NAME: Darryl Ward    MR#:  [id number]  DATE OF BIRTH:  Aug 20, 1945  DATE OF ADMISSION:  03/10/2023  PRIMARY CARE PHYSICIAN: [name], DO   Patient is coming from: Home  REQUESTING/REFERRING PHYSICIAN: I did, Fidela Juneau  CHIEF COMPLAINT:   Chief Complaint  Patient presents with   Weakness    HISTORY OF PRESENT ILLNESS:  Darryl Ward is a 78 y.o. Caucasian male with medical history significant for osteoarthritis, COPD, depression, essential hypertension, lung cancer followed by Dr. Ellin Saba, dyslipidemia and coronary artery disease as well as CVA, who presented to the emergency room with acute onset of recent recurrent falls with associated dizziness for the past week without injuries.  No paresthesias or focal muscle weakness.  He admitted to vertigo without tinnitus.  No fever or chills.  No nausea or vomiting or abdominal pain.  No urinary or stool incontinence.  No dysuria, oliguria or hematuria or flank pain.  No bleeding diathesis.  ED Course: When he came to the ER, vital signs were within normal.  Labs revealed potassium of 5.2 and later 4.3 and creatinine 1.43 better than previous level with albumin 3.3.  CBC showed mild anemia close to baseline.  UA was remarkable for specific gravity 1031. EKG as reviewed by me : Normal sinus rhythm with a rate of 62. Imaging: Portable chest x-ray showed unchanged elevation of the right hemidiaphragm with unchanged right apical density with no acute cardiopulmonary disease. Outpatient MRI on 2/14 revealed the following: 1. Motion degraded examination. 2. Subcentimeter early subacute infarct in the left frontal white matter. 3. No evidence of intracranial metastatic disease. 4. Unchanged right sphenoid wing meningioma. 5. 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 Head and neck CTA did not revealed the following: 1. No acute head CT finding. Old infarction in the right  basal ganglia. Old infarction or cyst in the left basal ganglia. Chronic small-vessel ischemic changes of the white matter. 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 2. Aortic atherosclerosis. 3. Atherosclerotic disease at both carotid bifurcations but without stenosis on the right. 45% stenosis proximal left ICA. 4. Atherosclerotic disease at both vertebral artery origins. 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 This is a chronic finding. 5. Atherosclerotic disease in both carotid siphon regions with stenosis estimated at 30-50% on both sides. 6. Atherosclerotic disease in the right vertebral artery V4 segment with stenosis estimated at 30%.  Dr. Otelia Limes was notified about the patient and recommended continuing Eliquis and admission here with consult to Dr. Melynda Ripple in AM.  The patient will be admitted to a medical telemetry bed for further evaluation and management. PAST MEDICAL HISTORY:   Past Medical History:  Diagnosis Date   AAA (abdominal aortic aneurysm) (HCC)    Arthritis    Cancer (HCC)    Carotid artery disease (HCC)    Nonobstructive   Cataract    COPD (chronic obstructive pulmonary disease) (HCC)    Coronary atherosclerosis of native coronary artery    PTCA small diagonal 2007 otherwise nonobstructive CAD   Depression    Dysrhythmia    Essential hypertension, benign    Hyperlipidemia    NSTEMI (non-ST elevated myocardial infarction) (HCC) 2007   Stroke [facility]) 2004   TIA (transient ischemic attack) 2006  PAST SURGICAL HISTORY:   Past Surgical History:  Procedure Laterality Date   AORTA - BILATERAL FEMORAL ARTERY BYPASS GRAFT  01/08/2012   Procedure: AORTA BIFEMORAL BYPASS GRAFT;  Surgeon: Sherren Kerns, MD;  Location: [facility] OR;  Service: Vascular;  Laterality: Bilateral;  using  18x44mm x 40cm Hemashield Gold Vascular Graft with Endarterectomy, Thombectomy and  Reimplantation of Inferior Mesenteric Artery   BACK SURGERY  2021   BRONCHIAL BIOPSY  06/11/2021   Procedure: BRONCHIAL BIOPSIES;  Surgeon: Leslye Peer, MD;  Location: [facility] ENDOSCOPY;  Service: Pulmonary;;   BRONCHIAL BRUSHINGS  06/11/2021   Procedure: BRONCHIAL BRUSHINGS;  Surgeon: Leslye Peer, MD;  Location: [facility] ENDOSCOPY;  Service: Pulmonary;;   BRONCHIAL NEEDLE ASPIRATION BIOPSY  06/11/2021   Procedure: BRONCHIAL NEEDLE ASPIRATION BIOPSIES;  Surgeon: Leslye Peer, MD;  Location: [facility] ENDOSCOPY;  Service: Pulmonary;;   CARDIOVERSION N/A 09/20/2021   Procedure: CARDIOVERSION;  Surgeon: [name], MD;  Location: [facility] ORS;  Service: Cardiovascular;  Laterality: N/A;   COLONOSCOPY N/A 04/14/2019   Procedure: COLONOSCOPY;  Surgeon: Corbin Ade, MD;  Location: [facility] ENDO SUITE;  Service: Endoscopy;  Laterality: N/A;  9:30   COLONOSCOPY WITH PROPOFOL N/A 07/25/2021   Procedure: COLONOSCOPY WITH PROPOFOL;  Surgeon: Lanelle Bal, DO;  Location: [facility] ENDO SUITE;  Service: Endoscopy;  Laterality: N/A;  1:00pm   FIDUCIAL MARKER PLACEMENT  06/11/2021   Procedure: FIDUCIAL MARKER PLACEMENT;  Surgeon: Leslye Peer, MD;  Location: [facility] ENDOSCOPY;  Service: Pulmonary;;   HIP ARTHROPLASTY Right 04/28/2022   Procedure: ARTHROPLASTY BIPOLAR HIP (HEMIARTHROPLASTY);  Surgeon: Sheral Apley, MD;  Location: [facility] OR;  Service: Orthopedics;  Laterality: Right;   INTERCOSTAL NERVE BLOCK Right 11/12/2021   Procedure: INTERCOSTAL NERVE BLOCK;  Surgeon: Loreli Slot, MD;  Location: [facility] OR;  Service: Thoracic;  Laterality: Right;   IR IMAGING GUIDED PORT INSERTION  07/31/2021   Left cataract surgery     LYMPH NODE DISSECTION Right 11/12/2021   Procedure: LYMPH NODE DISSECTION;  Surgeon: Loreli Slot, MD;  Location: [facility] OR;  Service: Thoracic;  Laterality: Right;   POLYPECTOMY  04/14/2019   Procedure:  POLYPECTOMY;  Surgeon: Corbin Ade, MD;  Location: [facility] ENDO SUITE;  Service: Endoscopy;;   POLYPECTOMY  07/25/2021   Procedure: POLYPECTOMY;  Surgeon: Lanelle Bal, DO;  Location: [facility] ENDO SUITE;  Service: Endoscopy;;   TEE WITHOUT CARDIOVERSION N/A 09/20/2021   Procedure: TRANSESOPHAGEAL ECHOCARDIOGRAM (TEE);  Surgeon: [name], MD;  Location: [facility] ORS;  Service: Cardiovascular;  Laterality: N/A;   TRANSFORAMINAL LUMBAR INTERBODY FUSION (TLIF) WITH PEDICLE SCREW FIXATION 1 LEVEL N/A 04/27/2020   Procedure: Transforaminal Lumbar Interbody Fusion Lumbar Five-Sacral One;  Surgeon: Bedelia Person, MD;  Location: [facility] OR;  Service: Neurosurgery;  Laterality: N/A;   VIDEO BRONCHOSCOPY WITH INSERTION OF INTERBRONCHIAL VALVE (IBV) N/A 11/22/2021   Procedure: VIDEO BRONCHOSCOPY WITH INSERTION OF INTERBRONCHIAL VALVE (IBV);  Surgeon: Loreli Slot, MD;  Location: [facility] OR;  Service: Thoracic;  Laterality: N/A;   VIDEO BRONCHOSCOPY WITH INSERTION OF INTERBRONCHIAL VALVE (IBV) N/A 01/24/2022   Procedure: VIDEO BRONCHOSCOPY WITH REMOVAL OF INTERBRONCHIAL VALVE (IBV);  Surgeon: Loreli Slot, MD;  Location: [facility] OR;  Service: Thoracic;  Laterality: N/A;   VIDEO BRONCHOSCOPY WITH RADIAL ENDOBRONCHIAL ULTRASOUND  06/11/2021   Procedure: VIDEO BRONCHOSCOPY WITH RADIAL ENDOBRONCHIAL ULTRASOUND;  Surgeon: Leslye Peer, MD;  Location: [facility] ENDOSCOPY;  Service: Pulmonary;;    SOCIAL HISTORY:   Social History  Tobacco Use   Smoking status: Former    Current packs/day: 0.00    Average packs/day: 1 pack/day for 40.0 years (40.0 ttl pk-yrs)    Types: Cigarettes    Start date: 07/1981    Quit date: 07/2021    Years since quitting: 1.6   Smokeless tobacco: Never   Tobacco comments:    1 pack of cigarettes smoked daily. 07/17/21 ARJ, RN   Substance Use Topics   Alcohol use: No    Comment: Prior history of regular alcohol use    FAMILY HISTORY:   Family History  Problem Relation  Age of Onset   Hyperlipidemia Sister    Heart attack Brother 13   Cancer - Colon Neg Hx     DRUG ALLERGIES:  No Known Allergies  REVIEW OF SYSTEMS:   ROS As per history of present illness. All pertinent systems were reviewed above. Constitutional, HEENT, cardiovascular, respiratory, GI, GU, musculoskeletal, neuro, psychiatric, endocrine, integumentary and hematologic systems were reviewed and are otherwise negative/unremarkable except for positive findings mentioned above in the HPI.   MEDICATIONS AT HOME:   Prior to Admission medications   Medication Sig Start Date End Date Taking? Authorizing Provider  albuterol (VENTOLIN HFA) 108 (90 Base) MCG/ACT inhaler Inhale 2 puffs into the lungs every 6 (six) hours as needed for wheezing or shortness of breath. 01/23/22   Leslye Peer, MD  apixaban (ELIQUIS) 5 MG TABS tablet Take 1 tablet (5 mg total) by mouth 2 (two) times daily. 06/19/22   [name], DO  atorvastatin (LIPITOR) 80 MG tablet Take 1 tablet (80 mg total) by mouth daily. 06/19/22   [name], DO  chlorthalidone (HYGROTON) 25 MG tablet 1 tablet   every other day Orally Once a day 10/10/20   [provider]  cyclobenzaprine (FLEXERIL) 10 MG tablet Take 1 tablet (10 mg total) by mouth 2 (two) times daily as needed. 06/19/22   [name], DO  escitalopram (LEXAPRO) 10 MG tablet Take 1 tablet (10 mg total) by mouth daily. 06/19/22   [name], DO  folic acid (KP FOLIC ACID) 1 MG tablet Take 1 tablet (1 mg total) by mouth daily. 06/19/22   [name], DO  furosemide (LASIX) 20 MG tablet Take 1 tablet (20 mg total) by mouth daily as needed. 06/19/22   [name], DO  magnesium oxide (MAG-OX) 400 (240 Mg) MG tablet Take 1 tablet (400 mg total) by mouth daily. 06/19/22   [name], DO  metoprolol succinate (TOPROL-XL) 25 MG 24 hr tablet Take 1 tablet (25 mg total) by mouth daily. 02/04/23   [name], DO  pantoprazole (PROTONIX) 40 MG tablet Take 1 tablet (40  mg total) by mouth daily. 06/19/22   [name], DO  PEMBROLIZUMAB IV Inject 200 mg into the vein every 21 ( twenty-one) days.    [provider]  tamsulosin (FLOMAX) 0.4 MG CAPS capsule Take 1 capsule (0.4 mg total) by mouth daily. 06/12/22   [name], DO  Tiotropium Bromide Monohydrate (SPIRIVA RESPIMAT) 2.5 MCG/ACT AERS Inhale 2 puffs into the lungs daily. 06/19/22   [name], DO      VITAL SIGNS:  Blood pressure 123/75, pulse 67, temperature 97.9 F (36.6 C), temperature source Oral, resp. rate 13, height 5\' 10"  (1.778 m), weight 84.2 kg, SpO2 97%.  PHYSICAL EXAMINATION:  Physical Exam  GENERAL:  78 y.o.-year-old Caucasian male patient lying in the bed with  no acute distress.  EYES: Pupils equal, round, reactive to light and accommodation. No scleral icterus. Extraocular muscles intact.  HEENT: Head atraumatic, normocephalic. Oropharynx and nasopharynx clear.  NECK:  Supple, no jugular venous distention. No thyroid enlargement, no tenderness.  LUNGS: Normal breath sounds bilaterally, no wheezing, rales,rhonchi or crepitation. No use of accessory muscles of respiration.  CARDIOVASCULAR: Regular rate and rhythm, S1, S2 normal. No murmurs, rubs, or gallops.  ABDOMEN: Soft, nondistended, nontender. Bowel sounds present. No organomegaly or mass.  EXTREMITIES: No pedal edema, cyanosis, or clubbing.  NEUROLOGIC: Cranial nerves II through XII are intact. Muscle strength 5/5 in all extremities. Sensation intact. Gait not checked.  PSYCHIATRIC: The patient is alert and oriented x 3.  Normal affect and good eye contact. SKIN: No obvious rash, lesion, or ulcer.   LABORATORY PANEL:   CBC Recent Labs  Lab 03/10/23 1423  WBC 7.3  HGB 11.8*  HCT 36.8*  PLT 163   ------------------------------------------------------------------------------------------------------------------  Chemistries  Recent Labs  Lab 03/06/23 1248 03/10/23 1423 03/10/23 1711  NA 138 142  --    K 4.0 5.2* 4.3  CL 105 106  --   CO2 24 26  --   GLUCOSE 111* 99  --   BUN 23 19  --   CREATININE 1.57* 1.43*  --   CALCIUM 9.0 9.2  --   MG 2.1  --   --   AST 20 19  --   ALT 12 7  --   ALKPHOS 71 77  --   BILITOT 0.7 0.7  --    ------------------------------------------------------------------------------------------------------------------  Cardiac Enzymes No results for input(s): "TROPONINI" in the last 168 hours. ------------------------------------------------------------------------------------------------------------------  RADIOLOGY:  CT ANGIO HEAD NECK W WO CM Result Date: 03/10/2023 CLINICAL DATA:  Neuro deficit, acute, stroke suspected. Weakness. Falling. Anticoagulated. EXAM: CT ANGIOGRAPHY HEAD AND NECK WITH AND WITHOUT CONTRAST TECHNIQUE: Multidetector CT imaging of the head and neck was performed using the standard protocol during bolus administration of intravenous contrast. Multiplanar CT image reconstructions and MIPs were obtained to evaluate the vascular anatomy. Carotid stenosis measurements (when applicable) are obtained utilizing NASCET criteria, using the distal internal carotid diameter as the denominator. RADIATION DOSE REDUCTION: This exam was performed according to the departmental dose-optimization program which includes automated exposure control, adjustment of the mA and/or kV according to patient size and/or use of iterative reconstruction technique. CONTRAST:  75mL OMNIPAQUE IOHEXOL 350 MG/ML SOLN COMPARISON:  MRI 03/07/2023 FINDINGS: CT HEAD FINDINGS Brain: No focal abnormality seen affecting the brainstem or cerebellum. Old infarction in the right basal ganglia. Old infarction or cyst in the left basal ganglia. Chronic small-vessel ischemic changes affect the white matter. No acute stroke visible by CT. No mass, hemorrhage, hydrocephalus or extra-axial collection. Chronic right sphenoid wing meningioma appears unchanged from prior exams. Vascular: There is  atherosclerotic calcification of the major vessels at the base of the brain. Skull: Negative Sinuses/Orbits: Clear/normal Other: None Review of the MIP images confirms the above findings CTA NECK FINDINGS Aortic arch: Aortic atherosclerosis.  Branching pattern is normal. Right carotid system: Common carotid artery widely patent to the bifurcation. Calcified plaque at the carotid bifurcation and ICA bulb but no stenosis. Cervical ICA widely patent beyond that. Left carotid system: Common carotid artery widely patent to the bifurcation. Calcified plaque at the carotid bifurcation and ICA bulb. Minimal diameter in the ICA bulb is 2.75 mm. Compared to a more distal cervical ICA diameter of 5 mm, this indicates a 45% stenosis. Vertebral arteries:  Dominant right vertebral artery shows calcified plaque at its origin with stenosis 30%. Beyond that, the vessel is widely patent through the cervical region to the foramen magnum. 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 Skeleton: Mild cervical spondylosis. Other neck: No mass or lymphadenopathy. Upper chest: Chronic pleural and parenchymal scarring on the right. Review of the MIP images confirms the above findings CTA HEAD FINDINGS Anterior circulation: Both internal carotid arteries are patent through the skull base and siphon regions. There is siphon atherosclerotic calcification with stenosis estimated at 30-50% on both sides. On the right, the internal carotid artery supplies the middle cerebral artery. The A1 segment is aplastic. On the left, the carotid artery supplies the left middle cerebral artery territory and both anterior cerebral artery territories. No large vessel occlusion or proximal stenosis. Posterior circulation: Both vertebral arteries are patent through the foramen magnum to the basilar artery.  Atherosclerotic plaque of the right vertebral artery V4 segment with stenosis estimated at 30%. No basilar stenosis. Superior cerebellar arteries and posterior cerebral arteries are patent. Venous sinuses: Patent and normal Anatomic variants: None other significant. Review of the MIP images confirms the above findings IMPRESSION: 1. No acute head CT finding. Old infarction in the right basal ganglia. Old infarction or cyst in the left basal ganglia. Chronic small-vessel ischemic changes of the white matter. 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 2. Aortic atherosclerosis. 3. Atherosclerotic disease at both carotid bifurcations but without stenosis on the right. 45% stenosis proximal left ICA. 4. Atherosclerotic disease at both vertebral artery origins. 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 This is a chronic finding. 5. Atherosclerotic disease in both carotid siphon regions with stenosis estimated at 30-50% on both sides. 6. Atherosclerotic disease in the right vertebral artery V4 segment with stenosis estimated at 30%. Electronically Signed   By: Paulina Fusi M.D.   On: 03/10/2023 18:51   DG Chest Portable 1 View Result Date: 03/10/2023 CLINICAL DATA:  Weakness and frequent falls with new finding of left frontal infarction. EXAM: PORTABLE CHEST 1 VIEW COMPARISON:  Chest radiograph dated 02/26/2023 FINDINGS: Lines/tubes: Left chest wall port tip projects over the superior cavoatrial junction. Lungs: Well inflated lungs. Unchanged elevation of the right hemidiaphragm with unchanged right apical density. Postsurgical changes of the right upper lung. Unchanged left lower lung cyst. No new focal consolidations. Pleura: No pneumothorax or pleural effusion. Heart/mediastinum: The heart size and mediastinal contours are within normal  limits. Bones: No acute osseous abnormality. IMPRESSION: 1. No acute disease. 2. Unchanged elevation of the right hemidiaphragm with unchanged right apical density. Electronically Signed   By: Agustin Cree M.D.   On: 03/10/2023 16:26      IMPRESSION AND PLAN:  Assessment and Plan: * Acute CVA (cerebrovascular accident) (HCC) - This is subacute left frontal lobe CVA. - The patient will be admitted to a medically monitored bed.   - We will follow neuro checks q.4 hours for 24 hours.   - The patient will be placed on aspirin.   - Will obtain  2D echo with bubble study .   - A neurology consultation can be called in AM and physical/occupation/speech therapy consults  will be obtained in a.m.[name]   - The patient will be placed on statin therapy and fasting lipids will be checked.   GERD without esophagitis - We will continue PPI therapy.  Recurrent falls - This could be related to his dizziness. - PT consult to be obtained as mentioned above to assess his ambulation.  Paroxysmal atrial fibrillation (HCC) - We will continue Eliquis and Toprol-XL.  History of lung cancer He is followed by Dr. Ellin Saba.  Depression - We will continue Lexapro.  Benign prostatic hyperplasia with lower urinary tract symptoms - We will continue Flomax.  Essential hypertension - We will continue antihypertensive therapy.  COPD (chronic obstructive pulmonary disease) (HCC) - We will continue his inhalers.       DVT prophylaxis: Lovenox.  Advanced Care Planning:  Code Status: full code.  Family Communication:  The plan of care was discussed in details with the patient (and family). I answered all questions. The patient agreed to proceed with the above mentioned plan. Further management will depend upon hospital course. Disposition Plan: Back to previous home environment Consults called: none.  All the records are reviewed and case discussed with ED provider.  Status is: Inpatient  At the time of  the admission, it appears that the appropriate admission status for this patient is inpatient.  This is judged to be reasonable and necessary in order to provide the required intensity of service to ensure the patient's safety given the presenting symptoms, physical exam findings and initial radiographic and laboratory data in the context of comorbid conditions.  The patient requires inpatient status due to high intensity of service, high risk of further deterioration and high frequency of surveillance required.  I certify that at the time of admission, it is my clinical judgment that the patient will require inpatient hospital care extending more than 2 midnights.                            Dispo: The patient is from: Home              Anticipated d/c is to: Home              Patient currently is not medically stable to d/c.              Difficult to place patient: No  Hannah Beat M.D on 03/11/2023 at 3:31 AM  [facility]   From 7 PM-7 AM, contact night-coverage [email or web address]  CC: Primary care physician; [name], DO

## 2023-03-11 NOTE — Assessment & Plan Note (Signed)
-   This could be related to his dizziness. - PT consult to be obtained as mentioned above to assess his ambulation.

## 2023-03-11 NOTE — TOC Initial Note (Signed)
Transition of Care [facility]) - Initial/Assessment Note    Patient Details  Name: Darryl Ward MRN: [id number] Date of Birth: 06/25/1945  Transition of Care [facility]) CM/SW Contact:    Villa Herb, LCSWA Phone Number: 03/11/2023, 3:37 PM  Clinical Narrative:                 CSW spoke with pt to complete assessment. Pt states that he lives with his significant other. Pt was mostly independent in completing his ADLs. Pt states that SO drives when needed. Pt has walker and rollator's to use in the home. CSW spoke with pt about PT recommendation for SNF, pt states he would like to speak with SO before making decision about SNF placement and referral being sent out. CSW to follow up once pt has spoken with SO. TOC to follow.   Expected Discharge Plan: Skilled Nursing Facility Barriers to Discharge: Continued Medical Work up   Patient Goals and CMS Choice Patient states their goals for this hospitalization and ongoing recovery are:: Get stronger CMS Medicare.gov Compare Post Acute Care list provided to:: Patient Choice offered to / list presented to : Patient  ownership interest in [facility].provided to:: Patient    Expected Discharge Plan and Services In-house Referral: Clinical Social Work Discharge Planning Services: CM Consult Post Acute Care Choice: Skilled Nursing Facility Living arrangements for the past 2 months: Single Family Home                                      Prior Living Arrangements/Services Living arrangements for the past 2 months: Single Family Home Lives with:: Significant Other Patient language and need for interpreter reviewed:: Yes Do you feel safe going back to the place where you live?: Yes      Need for Family Participation in Patient Care: Yes (Comment) Care giver support system in place?: Yes (comment)   Criminal Activity/Legal Involvement Pertinent to Current Situation/Hospitalization: No - Comment as needed  Activities  of Daily Living   ADL Screening (condition at time of admission) Independently performs ADLs?: Yes (appropriate for developmental age) Is the patient deaf or have difficulty hearing?: No Does the patient have difficulty seeing, even when wearing glasses/contacts?: No Does the patient have difficulty concentrating, remembering, or making decisions?: No  Permission Sought/Granted                  Emotional Assessment Appearance:: Appears stated age Attitude/Demeanor/Rapport: Engaged Affect (typically observed): Accepting Orientation: : Oriented to Self, Oriented to Place, Oriented to  Time, Oriented to Situation Alcohol / Substance Use: Not Applicable Psych Involvement: No (comment)  Admission diagnosis:  Acute CVA (cerebrovascular accident) [facility]) [I63.9] Cerebrovascular accident (CVA), unspecified mechanism (HCC) [I63.9] Patient Active Problem List   Diagnosis Date Noted   Recurrent falls 03/11/2023   Paroxysmal atrial fibrillation (HCC) 03/11/2023   GERD without esophagitis 03/11/2023   Depression 03/11/2023   History of lung cancer 03/11/2023   Acute CVA (cerebrovascular accident) (HCC) 03/10/2023   Lower extremity edema 09/26/2022   Benign prostatic hyperplasia with lower urinary tract symptoms 06/13/2022   Other intervertebral disc degeneration, lumbar region 05/01/2022   Hip fracture (HCC) 04/28/2022   Normocytic anemia 04/28/2022   Iron deficiency anemia 04/22/2022   History of stroke 03/27/2022   Chronic combined systolic and diastolic CHF (congestive heart failure) (HCC) 03/27/2022   PAF (paroxysmal atrial fibrillation) (HCC) 12/23/2021  Essential hypertension 09/30/2021   Primary adenocarcinoma of upper lobe of right lung (HCC) 06/26/2021   COPD (chronic obstructive pulmonary disease) (HCC) 05/29/2021   Lumbar spinal stenosis 02/06/2020   Meningioma, cerebral (HCC) 02/05/2020   Chronic midline low back pain without sciatica 04/27/2019   Tobacco abuse  11/17/2018   PVD (peripheral vascular disease) (HCC) 05/21/2012   Mixed hyperlipidemia 12/13/2011   Coronary atherosclerosis of native coronary artery 12/13/2011   PCP:  [name], DO Pharmacy:   [facility] [address] [place] [address] Phone: [phone] Fax: [phone]     Social Drivers of Health (SDOH) Social History: SDOH Screenings   Food Insecurity: No Food Insecurity (03/10/2023)  Housing: Low Risk  (03/10/2023)  Transportation Needs: No Transportation Needs (03/10/2023)  Utilities: Not At Risk (03/10/2023)  Alcohol Screen: Low Risk  (08/30/2022)  Depression (PHQ2-9): Low Risk  (09/25/2022)  Financial Resource Strain: High Risk (08/30/2022)  Physical Activity: Insufficiently Active (08/30/2022)  Social Connections: Socially Isolated (03/10/2023)  Stress: No Stress Concern Present (08/30/2022)  Tobacco Use: Medium Risk (03/10/2023)  Health Literacy: Adequate Health Literacy (08/30/2022)   SDOH Interventions:     Readmission Risk Interventions     No data to display

## 2023-03-11 NOTE — Progress Notes (Signed)
Sterile dressing change to port site to left chest, antimicrobial disc in place, site clean, dry, and intact. No redness or swelling noted.

## 2023-03-11 NOTE — Assessment & Plan Note (Signed)
 -  We will continue Flomax

## 2023-03-11 NOTE — Assessment & Plan Note (Signed)
 -  We will continue PPI therapy

## 2023-03-11 NOTE — Progress Notes (Signed)
Progress Note   Patient: Darryl Ward VFI:[id number] DOB: July 29, 1945 DOA: 03/10/2023     1 DOS: the patient was seen and examined on 03/11/2023   Brief hospital admission narrative: As per Dr. Arville Care H&P written on 03/10/23 Darryl Ward is a 78 y.o. Caucasian male with medical history significant for osteoarthritis, COPD, depression, essential hypertension, lung cancer followed by Dr. Ellin Saba, dyslipidemia and coronary artery disease as well as CVA, who presented to the emergency room with acute onset of recent recurrent falls with associated dizziness for the past week without injuries.  No paresthesias or focal muscle weakness.  He admitted to vertigo without tinnitus.  No fever or chills.  No nausea or vomiting or abdominal pain.  No urinary or stool incontinence.  No dysuria, oliguria or hematuria or flank pain.  No bleeding diathesis.   ED Course: When he came to the ER, vital signs were within normal.  Labs revealed potassium of 5.2 and later 4.3 and creatinine 1.43 better than previous level with albumin 3.3.  CBC showed mild anemia close to baseline.  UA was remarkable for specific gravity 1031.  Assessment and Plan: * Acute CVA (cerebrovascular accident) (HCC) - This is subacute left frontal lobe ischemic CVA. -Appreciate assistance and recommendation by neurology service -Continue Eliquis for secondary prevention; no need for coagulation aspirin therapy. -Smoking cessation, normotension and continuation of statin recommended. - Physical therapy has seen patient with recommendations for skilled nursing facility; patient at the time non decisive and will discuss with significant other. -A1c 5.5 -2D echo demonstrating no new wall motion abnormalities, no thrombi and a preserved ejection fraction.  GERD without esophagitis - Continue PPI.  Recurrent falls - This could be related to his dizziness. - As needed meclizine has been started -Physical therapy recommending skilled  nursing facility rehabilitation/conditioning.  Paroxysmal atrial fibrillation (HCC) - New [place] and the use of Eliquis -Continue telemetry monitoring.  History of lung cancer Continue outpatient follow-up with Dr. Ellin Saba.  Depression - No suicidal ideation hallucination -Continue Lexapro.  Benign prostatic hyperplasia with lower urinary tract symptoms - No complaints of urinary retention -Continue treatment with Flomax.  Essential hypertension - Continue current antihypertensive agents -Vital signs stable.  COPD (chronic obstructive pulmonary disease) (HCC) - No acute exacerbation appreciated -Continue bronchodilator management.  Tobacco abuse -Cessation counseling provided.   Subjective:  Afebrile, no chest pain, no nausea, no vomiting, good saturation on room air.  Not demonstrating any dysarthria, following commands appropriately and expressing intermittent off-balance sensation.  Physical Exam: Vitals:   03/11/23 1318 03/11/23 1320 03/11/23 1348 03/11/23 1759  BP: (!) 142/62  128/72 135/72  Pulse: 64 62 64 60  Resp:  16 16 17   Temp:  97.8 F (36.6 C) 97.6 F (36.4 C)   TempSrc:  Oral Oral   SpO2: 100% 99% 100% 100%  Weight:      Height:       General exam: Alert, awake, oriented x 3; no chest pain, no nausea vomiting.  Still expressing feeling dizzy and off balance at times. Respiratory system: Good saturation on room air. Cardiovascular system:RRR.  No rubs or gallops. Gastrointestinal system: Abdomen is nondistended, soft and nontender. No organomegaly or masses felt. Normal bowel sounds heard. Central nervous system: Moving 4 limbs spontaneously.  No focal neurological deficits. Extremities: No cyanosis or clubbing; reporting right knee pain (not new). Skin: No petechiae or rashes.  Left subclavian Port-A-Cath in place. Psychiatry: Mood & affect appropriate.   Data Reviewed: 1-CTA head and  neck -No acute CT finding.  Old infarction in the right  basal ganglia.  Old infarction or cyst in the left basal ganglia.  Chronic small vessel ischemic changes of the white matter.  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 -Atherosclerotic disease of both vertebral artery origins appreciated.  -Atherosclerotic disease at both carotid bifurcations but without a stenosis on the right.  45% stenosis proximal left ICA appreciated. -MRI of the brain demonstrating subcentimeter early subacute infarct in the left frontal white matter.  There is no evidence of metastatic disease or hemorrhagic changes. -Appreciate assistance and recommendation by neurology service; will continue treatment with Eliquis 5 mg twice a day for secondary prevention.  Continue risk factor modifications including continue the use of a statin, smoking cessation and normotension. -Follow recommendations by PT/OT; recommendations given for skilled nursing facility. -No speech therapy abnormalities appreciated and swallowing without problems.  Diet advanced. -Recent echo demonstrating ejection fraction around 55%, not new wall motion abnormalities and a grade 2 diastolic dysfunction.  No thrombus. Lipid panel: Total cholesterol 84, LDL 37 and HDL 28 B12: 232 A1c: 5.5   Family Communication: No family at bedside.  Disposition: Status is: Inpatient Remains inpatient appropriate because: Completed stroke workup.[name]   Planned Discharge Destination:  To be determined; physical therapy has recommended skilled nursing facility.  Patient undecided at the moment.  Time spent: 40 minutes  Author: Vassie Loll, MD 03/11/2023 7:16 PM  For on call review [email or web address].

## 2023-03-11 NOTE — Evaluation (Signed)
Physical Therapy Evaluation Patient Details Name: Darryl Ward MRN: [id number] DOB: 1946-01-07 Today's Date: 03/11/2023  History of Present Illness  Darryl Ward is a 78 y.o. Caucasian male with medical history significant for osteoarthritis, COPD, depression, essential hypertension, lung cancer followed by Dr. Ellin Saba, dyslipidemia and coronary artery disease as well as CVA, who presented to the emergency room with acute onset of recent recurrent falls with associated dizziness for the past week without injuries.  No paresthesias or focal muscle weakness.  He admitted to vertigo without tinnitus.  No fever or chills.  No nausea or vomiting or abdominal pain.  No urinary or stool incontinence.  No dysuria, oliguria or hematuria or flank pain.  No bleeding diathesis. (per MD)   Clinical Impression  Patient demonstrates fair/good return for sitting up at bedside, but had loss of balance with near fall during transfer to chair without AD.  Patient required use of RW for safety demonstrating slow labored cadence with flexed trunk, decreased BLE dorsiflexion and limited mostly due to fatigue, fall risk. Patient tolerated sitting up in chair after therapy - nursing staff notified. Patient will benefit from continued skilled physical therapy in hospital and recommended venue below to increase strength, balance, endurance for safe ADLs and gait.          If plan is discharge home, recommend the following: A lot of help with walking and/or transfers;A lot of help with bathing/dressing/bathroom;Assistance with cooking/housework;Help with stairs or ramp for entrance   Can travel by private vehicle   Yes    Equipment Recommendations None recommended by PT  Recommendations for Other Services       Functional Status Assessment Patient has had a recent decline in their functional status and demonstrates the ability to make significant improvements in function in a reasonable and predictable amount  of time.     Precautions / Restrictions Precautions Precautions: Fall Restrictions Weight Bearing Restrictions Per Provider Order: No      Mobility  Bed Mobility Overal bed mobility: Needs Assistance Bed Mobility: Supine to Sit     Supine to sit: Supervision     General bed mobility comments: slightly labored movement    Transfers Overall transfer level: Needs assistance Equipment used: Rolling walker (2 wheels) Transfers: Sit to/from Stand, Bed to chair/wheelchair/BSC Sit to Stand: Mod assist Stand pivot transfers: Mod assist         General transfer comment: very unsteady on feet without AD, required use of RW for safety    Ambulation/Gait Ambulation/Gait assistance: Mod assist Gait Distance (Feet): 20 Feet Assistive device: Rolling walker (2 wheels) Gait Pattern/deviations: Decreased step length - right, Decreased step length - left, Decreased stride length, Trunk flexed Gait velocity: slow     General Gait Details: slow labored movement with decreased bilateral dorsi-flexion, flexed trunk and limited mostly due to c/o fatigue  Stairs            Wheelchair Mobility     Tilt Bed    Modified Rankin (Stroke Patients Only)       Balance Overall balance assessment: Needs assistance Sitting-balance support: Feet unsupported, No upper extremity supported Sitting balance-Leahy Scale: Good Sitting balance - Comments: seated at EOB   Standing balance support: Bilateral upper extremity supported, During functional activity, Reliant on assistive device for balance Standing balance-Leahy Scale: Poor Standing balance comment: poor to fair with RW  Pertinent Vitals/Pain Pain Assessment Pain Assessment: Faces Faces Pain Scale: Hurts little more Pain Location: R Hip Pain Descriptors / Indicators: Discomfort, Grimacing, Guarding Pain Intervention(s): Limited activity within patient's tolerance, Monitored during  session, Repositioned    Home Living Family/patient expects to be discharged to:: Private residence Living Arrangements: Non-relatives/Friends (roommate) Available Help at Discharge: Available 24 hours/day;Other (Comment) Type of Home: House Home Access: Ramped entrance       Home Layout: One level Home Equipment: Agricultural consultant (2 wheels);Shower seat;Cane - quad;BSC/3in1 Additional Comments: "Darryl Ward" roommate of 40 years is who the pt has at home for support.    Prior Function Prior Level of Function : Independent/Modified Independent             Mobility Comments: PRN use of RW and quad cane; leaning on furniture and walls in the house. ADLs Comments: Independent ADL's; able to cook and clean; orders groceries     Extremity/Trunk Assessment   Upper Extremity Assessment Upper Extremity Assessment: Defer to OT evaluation    Lower Extremity Assessment Lower Extremity Assessment: Generalized weakness    Cervical / Trunk Assessment Cervical / Trunk Assessment: Kyphotic  Communication   Communication Communication: No apparent difficulties    Cognition Arousal: Alert Behavior During Therapy: WFL for tasks assessed/performed   PT - Cognitive impairments: No apparent impairments, Initiation                         Following commands: Intact       Cueing Cueing Techniques: Verbal cues, Tactile cues     General Comments      Exercises     Assessment/Plan    PT Assessment Patient needs continued PT services  PT Problem List Decreased strength;Decreased activity tolerance;Decreased balance;Decreased mobility       PT Treatment Interventions DME instruction;Gait training;Stair training;Functional mobility training;Balance training;Therapeutic exercise;Therapeutic activities;Patient/family education    PT Goals (Current goals can be found in the Care Plan section)  Acute Rehab PT Goals Patient Stated Goal: return home after rehab PT Goal  Formulation: With patient Time For Goal Achievement: 03/25/23 Potential to Achieve Goals: Good    Frequency Min 3X/week     Co-evaluation PT/OT/SLP Co-Evaluation/Treatment: Yes Reason for Co-Treatment: To address functional/ADL transfers PT goals addressed during session: Mobility/safety with mobility;Balance;Proper use of DME OT goals addressed during session: ADL's and self-care       AM-PAC PT "6 Clicks" Mobility  Outcome Measure Help needed turning from your back to your side while in a flat bed without using bedrails?: None Help needed moving from lying on your back to sitting on the side of a flat bed without using bedrails?: A Little Help needed moving to and from a bed to a chair (including a wheelchair)?: A Lot Help needed standing up from a chair using your arms (e.g., wheelchair or bedside chair)?: A Lot Help needed to walk in hospital room?: A Lot Help needed climbing 3-5 steps with a railing? : A Lot 6 Click Score: 15    End of Session   Activity Tolerance: Patient tolerated treatment well;Patient limited by fatigue Patient left: in chair;with call bell/phone within reach Nurse Communication: Mobility status PT Visit Diagnosis: Unsteadiness on feet (R26.81);Other abnormalities of gait and mobility (R26.89);Muscle weakness (generalized) (M62.81);Repeated falls (R29.6)    Time: 1610-9604 PT Time Calculation (min) (ACUTE ONLY): 23 min   Charges:   PT Evaluation $PT Eval Moderate Complexity: 1 Mod PT Treatments $Therapeutic Activity: 23-37 mins PT General  Charges $$ ACUTE PT VISIT: 1 Visit         1:51 PM, 03/11/23 Ocie Bob, MPT Physical Therapist with [facility] 336 [phone] office [phone] mobile phone

## 2023-03-11 NOTE — Assessment & Plan Note (Signed)
-   We will continue Lexapro. 

## 2023-03-11 NOTE — Assessment & Plan Note (Signed)
-  We will continue his inhalers.

## 2023-03-11 NOTE — Assessment & Plan Note (Signed)
He is followed by Dr. Ellin Saba.

## 2023-03-11 NOTE — Evaluation (Signed)
Occupational Therapy Evaluation Patient Details Name: Darryl Ward MRN: [id number] DOB: 01-Jun-1945 Today's Date: 03/11/2023   History of Present Illness   Darryl Ward is a 78 y.o. Caucasian male with medical history significant for osteoarthritis, COPD, depression, essential hypertension, lung cancer followed by Dr. Ellin Saba, dyslipidemia and coronary artery disease as well as CVA, who presented to the emergency room with acute onset of recent recurrent falls with associated dizziness for the past week without injuries.  No paresthesias or focal muscle weakness.  He admitted to vertigo without tinnitus.  No fever or chills.  No nausea or vomiting or abdominal pain.  No urinary or stool incontinence.  No dysuria, oliguria or hematuria or flank pain.  No bleeding diathesis. (per MD)     Clinical Impressions Pt agreeable to OT and PT co-evaluation. Pt reports independent ADL's at baseline with use of reacher for lower body tasks to due R hip issues. PRN use of quad cane and RW at baseline. Mod I for bed mobility and mod A for transfers and ambulation today with and without the RW. Mild B UE weakness but no asymmetry. Mod A for lower body dressing without reacher based on clinical judgement. Pt left in the chair with call bell within reach. Pt will benefit from continued OT in the hospital and recommended venue below to increase strength, balance, and endurance for safe ADL's.        If plan is discharge home, recommend the following:   A lot of help with walking and/or transfers;A lot of help with bathing/dressing/bathroom;Assistance with cooking/housework;Assist for transportation;Help with stairs or ramp for entrance     Functional Status Assessment   Patient has had a recent decline in their functional status and demonstrates the ability to make significant improvements in function in a reasonable and predictable amount of time.     Equipment Recommendations   None recommended  by OT             Precautions/Restrictions   Precautions Precautions: Fall Restrictions Weight Bearing Restrictions Per Provider Order: No     Mobility Bed Mobility Overal bed mobility: Needs Assistance Bed Mobility: Supine to Sit     Supine to sit: Modified independent (Device/Increase time), Supervision     General bed mobility comments: no physical assist    Transfers Overall transfer level: Needs assistance Equipment used: Rolling walker (2 wheels) Transfers: Sit to/from Stand, Bed to chair/wheelchair/BSC Sit to Stand: Mod assist Stand pivot transfers: Mod assist         General transfer comment: Without AD; bed to chair      Balance Overall balance assessment: Needs assistance Sitting-balance support: No upper extremity supported, Feet supported Sitting balance-Leahy Scale: Good Sitting balance - Comments: seated at EOB   Standing balance support: Bilateral upper extremity supported, During functional activity, Reliant on assistive device for balance Standing balance-Leahy Scale: Poor Standing balance comment: poor to fair with RW                           ADL either performed or assessed with clinical judgement   ADL Overall ADL's : Needs assistance/impaired Eating/Feeding: Independent;Sitting   Grooming: Set up;Sitting   Upper Body Bathing: Set up;Sitting   Lower Body Bathing: Moderate assistance;Sitting/lateral leans   Upper Body Dressing : Set up;Sitting   Lower Body Dressing: Moderate assistance;Sitting/lateral leans   Toilet Transfer: Moderate assistance;Ambulation;Stand-pivot;Rolling walker (2 wheels) Toilet Transfer Details (indicate cue type and reason): Simulated  via EOB to chair without AD and ambulation the door with RW Toileting- Clothing Manipulation and Hygiene: Contact guard assist;Sitting/lateral lean;Minimal assistance       Functional mobility during ADLs: Moderate assistance;Rolling walker (2 wheels)        Vision Baseline Vision/History: 1 Wears glasses Ability to See in Adequate Light: 1 Impaired Patient Visual Report: No change from baseline Vision Assessment?: Yes Tracking/Visual Pursuits: Other (comment) (midl eye darting at times during visual tracking) Visual Fields: No apparent deficits     Perception Perception: Not tested       Praxis Praxis: Not tested       Pertinent Vitals/Pain Pain Assessment Pain Assessment: Faces Faces Pain Scale: Hurts little more Pain Location: R hip Pain Descriptors / Indicators: Grimacing Pain Intervention(s): Limited activity within patient's tolerance, Monitored during session, Repositioned     Extremity/Trunk Assessment Upper Extremity Assessment Upper Extremity Assessment: Generalized weakness (4+/5 grossly)   Lower Extremity Assessment Lower Extremity Assessment: Defer to PT evaluation   Cervical / Trunk Assessment Cervical / Trunk Assessment: Kyphotic   Communication Communication Communication: No apparent difficulties   Cognition Arousal: Alert Behavior During Therapy: WFL for tasks assessed/performed Cognition: No apparent impairments                               Following commands: Intact       Cueing  General Comments   Cueing Techniques: Verbal cues;Tactile cues                 Home Living Family/patient expects to be discharged to:: Private residence Living Arrangements: Non-relatives/Friends (roommate) Available Help at Discharge: Available 24 hours/day;Other (Comment) Type of Home: House Home Access: Ramped entrance     Home Layout: One level     Bathroom Shower/Tub: Chief Strategy Officer: Standard     Home Equipment: Agricultural consultant (2 wheels);Shower seat;Cane - quad;BSC/3in1   Additional Comments: "[name]" roommate of 40 years is who the pt has at home for support.      Prior Functioning/Environment Prior Level of Function : Independent/Modified  Independent             Mobility Comments: PRN use of RW and quad cane; leaning on furniture and walls in the house. ADLs Comments: Independent ADL's; able to cook and clean; orders groceries    OT Problem List: Decreased strength;Decreased activity tolerance;Impaired balance (sitting and/or standing);Decreased safety awareness   OT Treatment/Interventions: Self-care/ADL training;Therapeutic exercise;Therapeutic activities;Patient/family education;Balance training;DME and/or AE instruction;Neuromuscular education      OT Goals(Current goals can be found in the care plan section)   Acute Rehab OT Goals Patient Stated Goal: return home OT Goal Formulation: With patient Time For Goal Achievement: 03/25/23 Potential to Achieve Goals: Good   OT Frequency:  Min 2X/week    Co-evaluation PT/OT/SLP Co-Evaluation/Treatment: Yes Reason for Co-Treatment: To address functional/ADL transfers   OT goals addressed during session: ADL's and self-care                       End of Session Equipment Utilized During Treatment: Rolling walker (2 wheels);Gait belt  Activity Tolerance: Patient tolerated treatment well Patient left: in chair;with call bell/phone within reach  OT Visit Diagnosis: Unsteadiness on feet (R26.81);Other abnormalities of gait and mobility (R26.89);Muscle weakness (generalized) (M62.81);History of falling (Z91.81);Other symptoms and signs involving the nervous system (R29.898)  Time: 9147-8295 OT Time Calculation (min): 22 min Charges:  OT General Charges $OT Visit: 1 Visit OT Evaluation $OT Eval Low Complexity: 1 Low  Anetria Harwick OT, MOT   Danie Chandler 03/11/2023, 10:19 AM

## 2023-03-11 NOTE — ED Notes (Signed)
 PT/OT at bedside.

## 2023-03-11 NOTE — Plan of Care (Signed)
  Problem: Acute Rehab OT Goals (only OT should resolve) Goal: Pt. Will Perform Grooming Flowsheets (Taken 03/11/2023 1021) Pt Will Perform Grooming:  with contact guard assist  standing Goal: Pt. Will Perform Lower Body Bathing Flowsheets (Taken 03/11/2023 1021) Pt Will Perform Lower Body Bathing:  with modified independence  sitting/lateral leans Goal: Pt. Will Perform Lower Body Dressing Flowsheets (Taken 03/11/2023 1021) Pt Will Perform Lower Body Dressing:  with modified independence  sitting/lateral leans Goal: Pt. Will Transfer To Toilet Flowsheets (Taken 03/11/2023 1021) Pt Will Transfer to Toilet:  with modified independence  ambulating Goal: Pt. Will Perform Toileting-Clothing Manipulation Flowsheets (Taken 03/11/2023 1021) Pt Will Perform Toileting - Clothing Manipulation and hygiene:  with modified independence  sitting/lateral leans Goal: Pt/Caregiver Will Perform Home Exercise Program Flowsheets (Taken 03/11/2023 1021) Pt/caregiver will Perform Home Exercise Program:  Increased strength  Both right and left upper extremity  Independently  Jaelyne Deeg OT, MOT

## 2023-03-11 NOTE — Progress Notes (Addendum)
SLP Cancellation Note  Patient Details Name: Darryl Ward MRN: [id number] DOB: [birth year]   Cancelled treatment:       Reason Eval/Treat Not Completed: SLP screened, no needs identified, will sign off;Other (comment). Per neuro consult note: Patient symptoms are most likely secondary to chronic deconditioning in the setting of cancer and subsequent treatment.  The stroke is most likely incidental and not causing any symptoms. Pt's cognition and speech are functioning at baseline. Thank you,  Earlin Sweeden H. Romie Levee, CCC-SLP Speech Language Pathologist    Georgetta Haber 03/11/2023, 3:23 PM

## 2023-03-11 NOTE — Assessment & Plan Note (Signed)
-   We will continue antihypertensive therapy.

## 2023-03-11 NOTE — Assessment & Plan Note (Addendum)
-   This is subacute left frontal lobe CVA. - The patient will be admitted to a medically monitored bed.   - We will follow neuro checks q.4 hours for 24 hours.   - The patient will be placed on aspirin.   - Will obtain  2D echo with bubble study .   - A neurology consultation can be called in AM and physical/occupation/speech therapy consults will be obtained in a.m.[name]   - The patient will be placed on statin therapy and fasting lipids will be checked.

## 2023-03-12 DIAGNOSIS — I639 Cerebral infarction, unspecified: Secondary | ICD-10-CM | POA: Diagnosis not present

## 2023-03-12 MED ORDER — SODIUM CHLORIDE 0.9% FLUSH: 10.0000 mL | INTRAVENOUS | Status: DC | PRN

## 2023-03-12 MED ORDER — SODIUM CHLORIDE 0.9% FLUSH
10.0000 mL | Freq: Two times a day (BID) | INTRAVENOUS | Status: DC
  Administered 2023-03-12 – 2023-03-14 (×5): 10 mL

## 2023-03-12 MED ORDER — CHLORHEXIDINE GLUCONATE CLOTH 2 % EX PADS
6.0000 | MEDICATED_PAD | Freq: Every day | CUTANEOUS | Status: DC
  Administered 2023-03-12 – 2023-03-14 (×3): 6 via TOPICAL

## 2023-03-12 MED ORDER — HALOPERIDOL LACTATE 5 MG/ML IJ SOLN
1.0000 mg | Freq: Four times a day (QID) | INTRAMUSCULAR | Status: DC | PRN
  Administered 2023-03-12: 2 mg via INTRAMUSCULAR
  Filled 2023-03-12: qty 1

## 2023-03-12 NOTE — Progress Notes (Signed)
Physical Therapy Treatment Patient Details Name: Darryl Ward MRN: [id number] DOB: 1945/06/12 Today's Date: 03/12/2023   History of Present Illness Darryl Ward is a 78 y.o. Caucasian male with medical history significant for osteoarthritis, COPD, depression, essential hypertension, lung cancer followed by Dr. Ellin Saba, dyslipidemia and coronary artery disease as well as CVA, who presented to the emergency room with acute onset of recent recurrent falls with associated dizziness for the past week without injuries.  No paresthesias or focal muscle weakness.  He admitted to vertigo without tinnitus.  No fever or chills.  No nausea or vomiting or abdominal pain.  No urinary or stool incontinence.  No dysuria, oliguria or hematuria or flank pain.  No bleeding diathesis.    PT Comments  Pt with improved I and tolerance but continues to need assistance due to fall risk and continues to fatigue easily     If plan is discharge home, recommend the following: A lot of help with walking and/or transfers;A lot of help with bathing/dressing/bathroom;Assistance with cooking/housework;Help with stairs or ramp for entrance   Can travel by private vehicle     Yes  Equipment Recommendations  None recommended by PT    Recommendations for Other Services  none     Precautions / Restrictions Precautions Precautions: Fall Restrictions Weight Bearing Restrictions Per Provider Order: No     Mobility  Bed Mobility               General bed mobility comments: pt already sitting    Transfers Overall transfer level: Needs assistance Equipment used: Rolling walker (2 wheels) Transfers: Sit to/from Stand, Bed to chair/wheelchair/BSC Sit to Stand: Min assist Stand pivot transfers: Min assist         General transfer comment: Pt prior level is using RW    Ambulation/Gait Ambulation/Gait assistance: Min assist Gait Distance (Feet): 40 Feet Assistive device: Rolling walker (2  wheels) Gait Pattern/deviations: Decreased step length - right, Decreased step length - left, Decreased stride length, Trunk flexed       General Gait Details: decreased WB on lT LE        Communication Communication Communication: No apparent difficulties  Cognition Arousal: Alert Behavior During Therapy: WFL for tasks assessed/performed   PT - Cognitive impairments: No apparent impairments, Initiation                         Following commands: Intact      Cueing Cueing Techniques: Verbal cues, Tactile cues  Exercises  Sitting:  10 reps each of ankle pumps, LAQ, hip flexion         Pertinent Vitals/Pain Pain Assessment Pain Assessment: No/denies pain    Home Living Family/patient expects to be discharged to:: Private residence Living Arrangements: Non-relatives/Friends (roommate) Available Help at Discharge: Available 24 hours/day;Other (Comment) Type of Home: House Home Access: Ramped entrance       Home Layout: One level Home Equipment: Agricultural consultant (2 wheels);Shower seat;Cane - quad;BSC/3in1 Additional Comments: "[name]" roommate of 40 years is who the pt has at home for support.    Prior Function            PT Goals (current goals can now be found in the care plan section) Acute Rehab PT Goals Patient Stated Goal: return home after rehab PT Goal Formulation: With patient Time For Goal Achievement: 03/25/23 Potential to Achieve Goals: Good    Frequency    Min 3X/week      PT  Plan  Continue with strengthening and gait training          End of Session Equipment Utilized During Treatment: Gait belt Activity Tolerance: Patient tolerated treatment well;Patient limited by fatigue Patient left: in chair;with call bell/phone within reach Nurse Communication: Mobility status PT Visit Diagnosis: Unsteadiness on feet (R26.81);Other abnormalities of gait and mobility (R26.89);Muscle weakness (generalized) (M62.81);Repeated falls (R29.6)      Time: 4034-7425 PT Time Calculation (min) (ACUTE ONLY): 28 min  Charges:    $Gait Training: 8-22 mins $Therapeutic Exercise: 8-22 mins PT General Charges $$ ACUTE PT VISIT: 1 Visit                       Virgina Organ, PT CLT [phone]  03/12/2023, 2:36 PM

## 2023-03-12 NOTE — Progress Notes (Signed)
Occupational Therapy Treatment Patient Details Name: Darryl Ward MRN: [id number] DOB: [birth year] Today's Date: 03/12/2023   History of present illness Darryl Ward is a 78 y.o. Caucasian male with medical history significant for osteoarthritis, COPD, depression, essential hypertension, lung cancer followed by Dr. Ellin Saba, dyslipidemia and coronary artery disease as well as CVA, who presented to the emergency room with acute onset of recent recurrent falls with associated dizziness for the past week without injuries.  No paresthesias or focal muscle weakness.  He admitted to vertigo without tinnitus.  No fever or chills.  No nausea or vomiting or abdominal pain.  No urinary or stool incontinence.  No dysuria, oliguria or hematuria or flank pain.  No bleeding diathesis.   OT comments  Pt agreeable to OT treatment. Pt required mod A for sit to stand from EOB but was able to improve to instances of more min to mod A with RW during x5 reps of sit to stand from the recliner. Able to ambulate to the toilet with mod A with significant limp noted and reports of R LE pain despite use of RW. Pt is progressing but still unsteady and a high fall risk. Pt left in the chair with the physical therapist present. Pt will benefit from continued OT in the hospital and recommended venue below to increase strength, balance, and endurance for safe ADL's.         If plan is discharge home, recommend the following:  A lot of help with walking and/or transfers;A lot of help with bathing/dressing/bathroom;Assistance with cooking/housework;Assist for transportation;Help with stairs or ramp for entrance   Equipment Recommendations  None recommended by OT          Precautions / Restrictions Precautions Precautions: Fall Restrictions Weight Bearing Restrictions Per Provider Order: No       Mobility Bed Mobility Overal bed mobility: Needs Assistance Bed Mobility: Supine to Sit     Supine to sit: Min  assist, Mod assist     General bed mobility comments: slow labored effort; assist to pull to sit and scoot to EOB    Transfers Overall transfer level: Needs assistance Equipment used: Rolling walker (2 wheels) Transfers: Sit to/from Stand Sit to Stand: Mod assist, Min assist     Step pivot transfers: Mod assist     General transfer comment: Sit to stand with RW from bed starting with mod A but progressed to min to mod in 5 reps from the chair.     Balance Overall balance assessment: Needs assistance Sitting-balance support: No upper extremity supported, Feet supported Sitting balance-Leahy Scale: Fair Sitting balance - Comments: seated at EOB   Standing balance support: Bilateral upper extremity supported, During functional activity, Reliant on assistive device for balance Standing balance-Leahy Scale: Poor Standing balance comment: poor to fair with RW                           ADL either performed or assessed with clinical judgement   ADL Overall ADL's : Needs assistance/impaired                         Toilet Transfer: Moderate assistance;Rolling walker (2 wheels);Ambulation Toilet Transfer Details (indicate cue type and reason): Chair to toilet with RW; use of grab bar to stand from the toilet.         Functional mobility during ADLs: Moderate assistance;Rolling walker (2 wheels) General ADL Comments: Ambulated to the  toilet and back with RW    Extremity/Trunk Assessment Upper Extremity Assessment Upper Extremity Assessment: Generalized weakness   Lower Extremity Assessment Lower Extremity Assessment: LLE deficits/detail LLE Deficits / Details: strength generally 3- to 3/5               Perception Perception Perception: Not tested   Praxis Praxis Praxis: Not tested   Communication Communication Communication: No apparent difficulties   Cognition Arousal: Alert Behavior During Therapy: WFL for tasks  assessed/performed Cognition: No apparent impairments                               Following commands: Intact        Cueing   Cueing Techniques: Verbal cues, Tactile cues  Exercises                   Pertinent Vitals/ Pain       Pain Assessment Pain Assessment: Faces Faces Pain Scale: Hurts little more Pain Location: R knee Pain Descriptors / Indicators: Grimacing, Guarding Pain Intervention(s): Limited activity within patient's tolerance, Monitored during session, Repositioned  Home Living Family/patient expects to be discharged to:: Private residence Living Arrangements: Non-relatives/Friends (roommate) Available Help at Discharge: Available 24 hours/day;Other (Comment) Type of Home: House Home Access: Ramped entrance     Home Layout: One level     Bathroom Shower/Tub: Chief Strategy Officer: Standard Bathroom Accessibility: Yes   Home Equipment: Agricultural consultant (2 wheels);Shower seat;Cane - quad;BSC/3in1   Additional Comments: "Darryl Ward" roommate of 40 years is who the pt has at home for support.                    Frequency  Min 2X/week        Progress Toward Goals  OT Goals(current goals can now be found in the care plan section)  Progress towards OT goals: Progressing toward goals  Acute Rehab OT Goals Patient Stated Goal: return home OT Goal Formulation: With patient Time For Goal Achievement: 03/25/23 Potential to Achieve Goals: Good ADL Goals Pt Will Perform Grooming: with contact guard assist;standing Pt Will Perform Lower Body Bathing: with modified independence;sitting/lateral leans Pt Will Perform Lower Body Dressing: with modified independence;sitting/lateral leans Pt Will Transfer to Toilet: with modified independence;ambulating Pt Will Perform Toileting - Clothing Manipulation and hygiene: with modified independence;sitting/lateral leans Pt/caregiver will Perform Home Exercise Program: Increased  strength;Both right and left upper extremity;Independently  Plan                                      End of Session Equipment Utilized During Treatment: Rolling walker (2 wheels);Gait belt  OT Visit Diagnosis: Unsteadiness on feet (R26.81);Other abnormalities of gait and mobility (R26.89);Muscle weakness (generalized) (M62.81);History of falling (Z91.81);Other symptoms and signs involving the nervous system (R29.898)   Activity Tolerance Patient tolerated treatment well   Patient Left in chair;Other (comment) (PT present and working with the patient.)   Nurse Communication          Time: [id number] OT Time Calculation (min): 18 min  Charges: OT General Charges $OT Visit: 1 Visit OT Treatments $Self Care/Home Management : 8-22 mins  Tayra Dawe OT, MOT   Danie Chandler 03/12/2023, 2:45 PM

## 2023-03-12 NOTE — Progress Notes (Addendum)
   03/12/23 0002  Vitals  Temp (!) 97.4 F (36.3 C)  Temp Source Oral  BP (!) 164/79  MAP (mmHg) 101  BP Location Right Arm  BP Method Automatic  Patient Position (if appropriate) Lying  Pulse Rate 63  Pulse Rate Source Monitor  Resp 20  Oxygen Therapy  SpO2 100 %  O2 Device Room Air  NIH Stroke Scale   Dizziness Present No  Headache Present No  Interval Other (Comment) (Q4H)  Level of Consciousness (1a.)    0  LOC Questions (1b. )    1  LOC Commands (1c. )    0  Best Gaze (2. )   0  Visual (3. )   0  Facial Palsy (4. )     0  Motor Arm, Left (5a. )    0  Motor Arm, Right (5b. )  0  Motor Leg, Left (6a. )   0  Motor Leg, Right (6b. )  0  Limb Ataxia (7. ) 0  Sensory (8. )   0  Best Language (9. )   0  Dysarthria (10. ) 0  Extinction/Inattention (11.)    0  Complete NIHSS TOTAL 1   Patient appearing with minor confusion, able to answer name, place, and time, but confused to situation, Very unstable when transferring to toilet., Dr. Arville Care made aware, see new orders

## 2023-03-12 NOTE — Care Management Important Message (Signed)
Important Message  Patient Details  Name: Darryl Ward MRN: [id number] Date of Birth: Mar 01, 1945   Important Message Given:  Yes - Medicare IM (spoke with [name] signficant other at [phone])     Corey Harold 03/12/2023, 11:08 AM

## 2023-03-12 NOTE — Progress Notes (Signed)
PROGRESS NOTE    MADDOCK FINIGAN  ZOX:[id number] DOB: 1945/03/18 DOA: 03/10/2023 PCP: [name], DO   Brief Narrative:    Darryl Ward is a 78 y.o. Caucasian male with medical history significant for osteoarthritis, COPD, depression, essential hypertension, lung cancer followed by Dr. Ellin Saba, dyslipidemia and coronary artery disease as well as CVA, who presented to the emergency room with acute onset of recent recurrent falls with associated dizziness for the past week without injuries.  He was admitted for suspicion of CVA and was noted to have acute ischemic CVA on MRI.  He has been assessed by neurology with recommendations to remain on Eliquis.  SNF placement pending.  Assessment & Plan:   Principal Problem:   Acute CVA (cerebrovascular accident) (HCC) Active Problems:   Recurrent falls   GERD without esophagitis   Paroxysmal atrial fibrillation (HCC)   COPD (chronic obstructive pulmonary disease) (HCC)   Essential hypertension   Benign prostatic hyperplasia with lower urinary tract symptoms   Depression   History of lung cancer  Assessment and Plan:  Acute CVA (cerebrovascular accident) (HCC) - This is subacute left frontal lobe ischemic CVA. -Appreciate assistance and recommendation by neurology service -Continue Eliquis for secondary prevention; no need for coagulation aspirin therapy. -Smoking cessation, normotension and continuation of statin recommended. - Physical therapy has seen patient with recommendations for skilled nursing facility; patient at the time non decisive and will discuss with significant other. -A1c 5.5 -2D echo demonstrating no new wall motion abnormalities, no thrombi and a preserved ejection fraction. -Awaiting SNF placement   GERD without esophagitis - Continue PPI.   Recurrent falls - This could be related to his dizziness. - As needed meclizine has been started -Physical therapy recommending skilled nursing facility  rehabilitation/conditioning.   Paroxysmal atrial fibrillation (HCC) - New Toprol and the use of Eliquis -Continue telemetry monitoring.   History of lung cancer Continue outpatient follow-up with Dr. Ellin Saba.   Depression - No suicidal ideation hallucination -Continue Lexapro.   Benign prostatic hyperplasia with lower urinary tract symptoms - No complaints of urinary retention -Continue treatment with Flomax.   Essential hypertension - Continue current antihypertensive agents -Vital signs stable.   COPD (chronic obstructive pulmonary disease) (HCC) - No acute exacerbation appreciated -Continue bronchodilator management.   Tobacco abuse -Cessation counseling provided.  DVT prophylaxis:apixaban Code Status: Full Family Communication: None at bedside Disposition Plan:  Status is: Inpatient Remains inpatient appropriate because: Need for close evaluation and placement.   Consultants:  Neurology  Procedures:  None  Antimicrobials:  None   Subjective: Patient seen and evaluated today with no new acute complaints or concerns. No acute concerns or events noted overnight.  Objective: Vitals:   03/12/23 0002 03/12/23 0204 03/12/23 0448 03/12/23 1016  BP: (!) 164/79 139/65 121/73 (!) 134/93  Pulse: 63 64 72 82  Resp: 20 20 18 20   Temp: (!) 97.4 F (36.3 C) (!) 97.5 F (36.4 C) 97.7 F (36.5 C) 97.9 F (36.6 C)  TempSrc: Oral Oral Oral Oral  SpO2: 100% 94% 97% 97%  Weight:      Height:        Intake/Output Summary (Last 24 hours) at 03/12/2023 1317 Last data filed at 03/12/2023 0300 Gross per 24 hour  Intake 879.1 ml  Output 1575 ml  Net -695.9 ml   Filed Weights   03/10/23 1240  Weight: 84.2 kg    Examination:  General exam: Appears calm and comfortable  Respiratory system: Clear to auscultation. Respiratory  effort normal. Cardiovascular system: S1 & S2 heard, RRR.  Gastrointestinal system: Abdomen is soft Central nervous system: Alert and  awake Extremities: No edema Skin: No significant lesions noted Psychiatry: Flat affect.    Data Reviewed: I have personally reviewed following labs and imaging studies  CBC: Recent Labs  Lab 03/06/23 1248 03/10/23 1423  WBC 7.4 7.3  NEUTROABS 5.5 5.0  HGB 11.7* 11.8*  HCT 34.7* 36.8*  MCV 94.3 96.1  PLT 159 163   Basic Metabolic Panel: Recent Labs  Lab 03/06/23 1248 03/10/23 1423 03/10/23 1711  NA 138 142  --   K 4.0 5.2* 4.3  CL 105 106  --   CO2 24 26  --   GLUCOSE 111* 99  --   BUN 23 19  --   CREATININE 1.57* 1.43*  --   CALCIUM 9.0 9.2  --   MG 2.1  --   --    GFR: Estimated Creatinine Clearance: 45.4 mL/min (A) (by C-G formula based on SCr of 1.43 mg/dL (H)). Liver Function Tests: Recent Labs  Lab 03/06/23 1248 03/10/23 1423  AST 20 19  ALT 12 7  ALKPHOS 71 77  BILITOT 0.7 0.7  PROT 7.1 6.9  ALBUMIN 3.4* 3.3*   No results for input(s): "LIPASE", "AMYLASE" in the last 168 hours. No results for input(s): "AMMONIA" in the last 168 hours. Coagulation Profile: No results for input(s): "INR", "PROTIME" in the last 168 hours. Cardiac Enzymes: No results for input(s): "CKTOTAL", "CKMB", "CKMBINDEX", "TROPONINI" in the last 168 hours. BNP (last 3 results) No results for input(s): "PROBNP" in the last 8760 hours. HbA1C: Recent Labs    03/10/23 1423  HGBA1C 5.5   CBG: Recent Labs  Lab 03/10/23 1954  GLUCAP 51*   Lipid Profile: Recent Labs    03/11/23 0425  CHOL 84  HDL 28*  LDLCALC 37  TRIG 94  CHOLHDL 3.0   Thyroid Function Tests: No results for input(s): "TSH", "T4TOTAL", "FREET4", "T3FREE", "THYROIDAB" in the last 72 hours. Anemia Panel: Recent Labs    03/11/23 0425  VITAMINB12 232   Sepsis Labs: No results for input(s): "PROCALCITON", "LATICACIDVEN" in the last 168 hours.  No results found for this or any previous visit (from the past 240 hours).       Radiology Studies: CT ANGIO HEAD NECK W WO CM Result Date:  03/10/2023 CLINICAL DATA:  Neuro deficit, acute, stroke suspected. Weakness. Falling. Anticoagulated. EXAM: CT ANGIOGRAPHY HEAD AND NECK WITH AND WITHOUT CONTRAST TECHNIQUE: Multidetector CT imaging of the head and neck was performed using the standard protocol during bolus administration of intravenous contrast. Multiplanar CT image reconstructions and MIPs were obtained to evaluate the vascular anatomy. Carotid stenosis measurements (when applicable) are obtained utilizing NASCET criteria, using the distal internal carotid diameter as the denominator. RADIATION DOSE REDUCTION: This exam was performed according to the departmental dose-optimization program which includes automated exposure control, adjustment of the mA and/or kV according to patient size and/or use of iterative reconstruction technique. CONTRAST:  75mL OMNIPAQUE IOHEXOL 350 MG/ML SOLN COMPARISON:  MRI 03/07/2023 FINDINGS: CT HEAD FINDINGS Brain: No focal abnormality seen affecting the brainstem or cerebellum. Old infarction in the right basal ganglia. Old infarction or cyst in the left basal ganglia. Chronic small-vessel ischemic changes affect the white matter. No acute stroke visible by CT. No mass, hemorrhage, hydrocephalus or extra-axial collection. Chronic right sphenoid wing meningioma appears unchanged from prior exams. Vascular: There is atherosclerotic calcification of the major vessels at the base  of the brain. Skull: Negative Sinuses/Orbits: Clear/normal Other: None Review of the MIP images confirms the above findings CTA NECK FINDINGS Aortic arch: Aortic atherosclerosis.  Branching pattern is normal. Right carotid system: Common carotid artery widely patent to the bifurcation. Calcified plaque at the carotid bifurcation and ICA bulb but no stenosis. Cervical ICA widely patent beyond that. Left carotid system: Common carotid artery widely patent to the bifurcation. Calcified plaque at the carotid bifurcation and ICA bulb. Minimal  diameter in the ICA bulb is 2.75 mm. Compared to a more distal cervical ICA diameter of 5 mm, this indicates a 45% stenosis. Vertebral arteries: Dominant right vertebral artery shows calcified plaque at its origin with stenosis 30%. Beyond that, the vessel is widely patent through the cervical region to the foramen magnum. 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 Skeleton: Mild cervical spondylosis. Other neck: No mass or lymphadenopathy. Upper chest: Chronic pleural and parenchymal scarring on the right. Review of the MIP images confirms the above findings CTA HEAD FINDINGS Anterior circulation: Both internal carotid arteries are patent through the skull base and siphon regions. There is siphon atherosclerotic calcification with stenosis estimated at 30-50% on both sides. On the right, the internal carotid artery supplies the middle cerebral artery. The A1 segment is aplastic. On the left, the carotid artery supplies the left middle cerebral artery territory and both anterior cerebral artery territories. No large vessel occlusion or proximal stenosis. Posterior circulation: Both vertebral arteries are patent through the foramen magnum to the basilar artery. Atherosclerotic plaque of the right vertebral artery V4 segment with stenosis estimated at 30%. No basilar stenosis. Superior cerebellar arteries and posterior cerebral arteries are patent. Venous sinuses: Patent and normal Anatomic variants: None other significant. Review of the MIP images confirms the above findings IMPRESSION: 1. No acute head CT finding. Old infarction in the right basal ganglia. Old infarction or cyst in the left basal ganglia. Chronic small-vessel ischemic changes of the white matter. 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 2. Aortic atherosclerosis. 3.  Atherosclerotic disease at both carotid bifurcations but without stenosis on the right. 45% stenosis proximal left ICA. 4. Atherosclerotic disease at both vertebral artery origins. 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 This is a chronic finding. 5. Atherosclerotic disease in both carotid siphon regions with stenosis estimated at 30-50% on both sides. 6. Atherosclerotic disease in the right vertebral artery V4 segment with stenosis estimated at 30%. Electronically Signed   By: Paulina Fusi M.D.   On: 03/10/2023 18:51   DG Chest Portable 1 View Result Date: 03/10/2023 CLINICAL DATA:  Weakness and frequent falls with new finding of left frontal infarction. EXAM: PORTABLE CHEST 1 VIEW COMPARISON:  Chest radiograph dated 02/26/2023 FINDINGS: Lines/tubes: Left chest wall port tip projects over the superior cavoatrial junction. Lungs: Well inflated lungs. Unchanged elevation of the right hemidiaphragm with unchanged right apical density. Postsurgical changes of the right upper lung. Unchanged left lower lung cyst. No new focal consolidations. Pleura: No pneumothorax or pleural effusion. Heart/mediastinum: The heart size and mediastinal contours are within normal limits. Bones: No acute osseous abnormality. IMPRESSION: 1. No acute disease. 2. Unchanged elevation of the right hemidiaphragm  with unchanged right apical density. Electronically Signed   By: Agustin Cree M.D.   On: 03/10/2023 16:26        Scheduled Meds:  apixaban  5 mg Oral BID   atorvastatin  80 mg Oral Daily   Chlorhexidine Gluconate Cloth  6 each Topical Daily   chlorthalidone  25 mg Oral Daily   escitalopram  10 mg Oral Daily   folic acid  1 mg Oral Daily   magnesium oxide  400 mg Oral Daily   metoprolol succinate  25 mg Oral Daily   pantoprazole  40 mg Oral Daily   sodium chloride  flush  10-40 mL Intracatheter Q12H   tamsulosin  0.4 mg Oral Daily   umeclidinium bromide  1 puff Inhalation Daily     LOS: 2 days    Time spent: 35 minutes    Malyia Moro Hoover Brunette, DO [facility]  If 7PM-7AM, please contact night-coverage [email or web address] 03/12/2023, 1:17 PM

## 2023-03-12 NOTE — TOC Progression Note (Addendum)
Transition of Care [facility]) - Progression Note    Patient Details  Name: Darryl Ward MRN: [id number] Date of Birth: 1945/11/11  [facility] CM/SW Contact  V[place], [place] Phone Number: 03/12/2023, 11:45 AM  Clinical Narrative:    CSW spoke to pts SO [name] who states they have spoken about SNF and are agreeable for SNF placement. Preference is for SNF at [facility] or [facility]. CSW to send out SNF referral for review. CSW called [employer] to start SNF auth, VM left requesting return call. Insurance auth pending. TOC to follow.   Expected Discharge Plan: Skilled Nursing Facility Barriers to Discharge: Continued Medical Work up  Expected Discharge Plan and Services In-house Referral: Clinical Social Work Discharge Planning Services: CM Consult Post Acute Care Choice: Skilled Nursing Facility Living arrangements for the past 2 months: Single Family Home                                       Social Determinants of Health (SDOH) Interventions SDOH Screenings   Food Insecurity: No Food Insecurity (03/10/2023)  Housing: Low Risk  (03/10/2023)  Transportation Needs: No Transportation Needs (03/10/2023)  Utilities: Not At Risk (03/10/2023)  Alcohol Screen: Low Risk  (08/30/2022)  Depression (PHQ2-9): Low Risk  (09/25/2022)  Financial Resource Strain: High Risk (08/30/2022)  Physical Activity: Insufficiently Active (08/30/2022)  Social Connections: Socially Isolated (03/10/2023)  Stress: No Stress Concern Present (08/30/2022)  Tobacco Use: Medium Risk (03/10/2023)  Health Literacy: Adequate Health Literacy (08/30/2022)    Readmission Risk Interventions     No data to display

## 2023-03-12 NOTE — NC FL2 (Signed)
[place] MEDICAID FL2 LEVEL OF CARE FORM     IDENTIFICATION  Patient Name: Darryl Ward Birthdate: 1945-04-03 Sex: male Admission Date (Current Location): 03/10/2023  [facility] and [place] Number:  [employer] and Address:  [facility],  [address], [place] [address]      Provider Number: [id number]  Attending Physician Name and Address:  Erick Blinks, DO  Relative Name and Phone Number:       Current Level of Care: Hospital Recommended Level of Care: Skilled Nursing Facility Prior Approval Number:    Date Approved/Denied:   PASRR Number: [id number] (must type name as "Aneta Mins")  Discharge Plan: SNF    Current Diagnoses: Patient Active Problem List   Diagnosis Date Noted   Recurrent falls 03/11/2023   Paroxysmal atrial fibrillation (HCC) 03/11/2023   GERD without esophagitis 03/11/2023   Depression 03/11/2023   History of lung cancer 03/11/2023   Acute CVA (cerebrovascular accident) (HCC) 03/10/2023   Lower extremity edema 09/26/2022   Benign prostatic hyperplasia with lower urinary tract symptoms 06/13/2022   Other intervertebral disc degeneration, lumbar region 05/01/2022   Hip fracture (HCC) 04/28/2022   Normocytic anemia 04/28/2022   Iron deficiency anemia 04/22/2022   History of stroke 03/27/2022   Chronic combined systolic and diastolic CHF (congestive heart failure) (HCC) 03/27/2022   PAF (paroxysmal atrial fibrillation) (HCC) 12/23/2021   Essential hypertension 09/30/2021   Primary adenocarcinoma of upper lobe of right lung (HCC) 06/26/2021   COPD (chronic obstructive pulmonary disease) (HCC) 05/29/2021   Lumbar spinal stenosis 02/06/2020   Meningioma, cerebral (HCC) 02/05/2020   Chronic midline low back pain without sciatica 04/27/2019   Tobacco abuse 11/17/2018   PVD (peripheral vascular disease) (HCC) 05/21/2012   Mixed hyperlipidemia 12/13/2011   Coronary atherosclerosis of native coronary artery 12/13/2011     Orientation RESPIRATION BLADDER Height & Weight     Self, Time, Situation, Place  Normal Continent Weight: 185 lb 10 oz (84.2 kg) Height:  5\' 10"  (177.8 cm)  BEHAVIORAL SYMPTOMS/MOOD NEUROLOGICAL BOWEL NUTRITION STATUS      Continent Diet (Heart healthy)  AMBULATORY STATUS COMMUNICATION OF NEEDS Skin   Extensive Assist Verbally Normal                       Personal Care Assistance Level of Assistance  Bathing, Dressing, Feeding Bathing Assistance: Limited assistance Feeding assistance: Independent Dressing Assistance: Limited assistance     Functional Limitations Info  Sight, Hearing, Speech Sight Info: Impaired Hearing Info: Impaired Speech Info: Adequate    SPECIAL CARE FACTORS FREQUENCY  PT (By licensed PT), OT (By licensed OT)     PT Frequency: 5 times weekly OT Frequency: 5 times weekly            Contractures Contractures Info: Not present    Additional Factors Info  Code Status, Allergies Code Status Info: FULL Allergies Info: NKA           Current Medications (03/12/2023):  This is the current hospital active medication list Current Facility-Administered Medications  Medication Dose Route Frequency Provider Last Rate Last Admin   acetaminophen (TYLENOL) tablet 650 mg  650 mg Oral Q4H PRN Mansy, Jan A, MD   650 mg at 03/11/23 2024   Or   acetaminophen (TYLENOL) 160 MG/5ML solution 650 mg  650 mg Per Tube Q4H PRN Mansy, Jan A, MD       Or   acetaminophen (TYLENOL) suppository 650 mg  650 mg  Rectal Q4H PRN Mansy, Jan A, MD       albuterol (PROVENTIL) (2.5 MG/3ML) 0.083% nebulizer solution 3 mL  3 mL Inhalation Q6H PRN Mansy, Jan A, MD       apixaban Everlene Balls) tablet 5 mg  5 mg Oral BID Mansy, Jan A, MD   5 mg at 03/12/23 0945   atorvastatin (LIPITOR) tablet 80 mg  80 mg Oral Daily Mansy, Jan A, MD   80 mg at 03/12/23 9604   Chlorhexidine Gluconate Cloth 2 % PADS 6 each  6 each Topical Daily Sherryll Burger, Pratik D, DO       chlorthalidone (HYGROTON)  tablet 25 mg  25 mg Oral Daily Mansy, Jan A, MD   25 mg at 03/12/23 0944   cyclobenzaprine (FLEXERIL) tablet 10 mg  10 mg Oral BID PRN Mansy, Jan A, MD       escitalopram (LEXAPRO) tablet 10 mg  10 mg Oral Daily Mansy, Jan A, MD   10 mg at 03/12/23 0945   folic acid (FOLVITE) tablet 1 mg  1 mg Oral Daily Mansy, Jan A, MD   1 mg at 03/12/23 0945   furosemide (LASIX) tablet 20 mg  20 mg Oral Daily PRN Mansy, Jan A, MD       haloperidol lactate (HALDOL) injection 1-2 mg  1-2 mg Intramuscular Q6H PRN Mansy, Jan A, MD   2 mg at 03/12/23 0021   magnesium oxide (MAG-OX) tablet 400 mg  400 mg Oral Daily Mansy, Jan A, MD   400 mg at 03/12/23 5409   meclizine (ANTIVERT) tablet 25 mg  25 mg Oral TID PRN Vassie Loll, MD       metoprolol succinate (TOPROL-XL) 24 hr tablet 25 mg  25 mg Oral Daily Mansy, Jan A, MD   25 mg at 03/12/23 0944   pantoprazole (PROTONIX) EC tablet 40 mg  40 mg Oral Daily Mansy, Jan A, MD   40 mg at 03/12/23 0944   senna-docusate (Senokot-S) tablet 1 tablet  1 tablet Oral QHS PRN Mansy, Jan A, MD       sodium chloride flush (NS) 0.9 % injection 10-40 mL  10-40 mL Intracatheter Q12H Shah, Pratik D, DO   10 mL at 03/12/23 0948   sodium chloride flush (NS) 0.9 % injection 10-40 mL  10-40 mL Intracatheter PRN Sherryll Burger, Pratik D, DO       tamsulosin [facility]) capsule 0.4 mg  0.4 mg Oral Daily Mansy, Jan A, MD   0.4 mg at 03/12/23 0946   umeclidinium bromide (INCRUSE ELLIPTA) 62.5 MCG/ACT 1 puff  1 puff Inhalation Daily Mansy, Jan A, MD   1 puff at 03/12/23 8119   Facility-Administered Medications Ordered in Other Encounters  Medication Dose Route Frequency Provider Last Rate Last Admin   sodium chloride flush (NS) 0.9 % injection 10 mL  10 mL Intracatheter PRN [name], MD   10 mL at 07/10/22 1513     Discharge Medications: Please see discharge summary for a list of discharge medications.  Relevant Imaging Results:  Relevant Lab Results:   Additional Information SSN:  [id number] [place]

## 2023-03-12 NOTE — Care Management Important Message (Signed)
Important Message  Patient Details  Name: Darryl Ward MRN: [id number] Date of Birth: [birth year]   Important Message Given:  Yes - Medicare IM     Corey Harold 03/12/2023, 10:59 AM

## 2023-03-13 ENCOUNTER — Inpatient Hospital Stay: Payer: HMO

## 2023-03-13 ENCOUNTER — Inpatient Hospital Stay: Payer: HMO | Admitting: Hematology

## 2023-03-13 DIAGNOSIS — C3411 Malignant neoplasm of upper lobe, right bronchus or lung: Secondary | ICD-10-CM

## 2023-03-13 DIAGNOSIS — I639 Cerebral infarction, unspecified: Secondary | ICD-10-CM | POA: Diagnosis not present

## 2023-03-13 NOTE — Plan of Care (Signed)

## 2023-03-13 NOTE — Progress Notes (Signed)
PROGRESS NOTE    Darryl Ward  GMW:[id number] DOB: 1946/01/22 DOA: 03/10/2023 PCP: [name], DO   Brief Narrative:    Darryl Ward is a 78 y.o. Caucasian male with medical history significant for osteoarthritis, COPD, depression, essential hypertension, lung cancer followed by Dr. Ellin Saba, dyslipidemia and coronary artery disease as well as CVA, who presented to the emergency room with acute onset of recent recurrent falls with associated dizziness for the past week without injuries.  He was admitted for suspicion of CVA and was noted to have acute ischemic CVA on MRI.  He has been assessed by neurology with recommendations to remain on Eliquis.  SNF placement pending.  Assessment & Plan:   Principal Problem:   Acute CVA (cerebrovascular accident) (HCC) Active Problems:   Recurrent falls   GERD without esophagitis   Paroxysmal atrial fibrillation (HCC)   COPD (chronic obstructive pulmonary disease) (HCC)   Essential hypertension   Benign prostatic hyperplasia with lower urinary tract symptoms   Depression   History of lung cancer  Assessment and Plan:  Acute CVA (cerebrovascular accident) (HCC) - This is subacute left frontal lobe ischemic CVA. -Appreciate assistance and recommendation by neurology service -Continue Eliquis for secondary prevention; no need for coagulation aspirin therapy. -Smoking cessation, normotension and continuation of statin recommended. - Physical therapy has seen patient with recommendations for skilled nursing facility; patient at the time non decisive and will discuss with significant other. -A1c 5.5 -2D echo demonstrating no new wall motion abnormalities, no thrombi and a preserved ejection fraction. -Awaiting SNF placement which is reasonable to consider given the fact that it is possible that CVA might be contributing to his deconditioning as opposed to general decline and debility from cancer diagnosis.   GERD without esophagitis -  Continue PPI.   Recurrent falls - This could be related to his dizziness. - As needed meclizine has been started -Physical therapy recommending skilled nursing facility rehabilitation/conditioning.   Paroxysmal atrial fibrillation (HCC) - New Toprol and the use of Eliquis -Continue telemetry monitoring.   History of lung cancer Continue outpatient follow-up with Dr. Ellin Saba.   Depression - No suicidal ideation hallucination -Continue Lexapro.   Benign prostatic hyperplasia with lower urinary tract symptoms - No complaints of urinary retention -Continue treatment with Flomax.   Essential hypertension - Continue current antihypertensive agents -Vital signs stable.   COPD (chronic obstructive pulmonary disease) (HCC) - No acute exacerbation appreciated -Continue bronchodilator management.   Tobacco abuse -Cessation counseling provided.  DVT prophylaxis:apixaban Code Status: Full Family Communication: None at bedside Disposition Plan:  Status is: Inpatient Remains inpatient appropriate because: Need for close evaluation and placement.   Consultants:  Neurology  Procedures:  None  Antimicrobials:  None   Subjective: Patient seen and evaluated today with no new acute complaints or concerns. No acute concerns or events noted overnight.  Objective: Vitals:   03/12/23 2014 03/13/23 0103 03/13/23 0421 03/13/23 1237  BP: 133/69 110/68 130/75 119/69  Pulse: 68 68 70 73  Resp:    17  Temp: 97.8 F (36.6 C) 97.8 F (36.6 C)  97.6 F (36.4 C)  TempSrc: Oral Oral  Oral  SpO2: 99% 97% 98% 97%  Weight:      Height:        Intake/Output Summary (Last 24 hours) at 03/13/2023 1401 Last data filed at 03/13/2023 0857 Gross per 24 hour  Intake 250 ml  Output 900 ml  Net -650 ml   Filed Weights   03/10/23  1240  Weight: 84.2 kg    Examination:  General exam: Appears calm and comfortable  Respiratory system: Clear to auscultation. Respiratory effort  normal. Cardiovascular system: S1 & S2 heard, RRR.  Gastrointestinal system: Abdomen is soft Central nervous system: Alert and awake Extremities: No edema Skin: No significant lesions noted Psychiatry: Flat affect.    Data Reviewed: I have personally reviewed following labs and imaging studies  CBC: Recent Labs  Lab 03/10/23 1423  WBC 7.3  NEUTROABS 5.0  HGB 11.8*  HCT 36.8*  MCV 96.1  PLT 163   Basic Metabolic Panel: Recent Labs  Lab 03/10/23 1423 03/10/23 1711  NA 142  --   K 5.2* 4.3  CL 106  --   CO2 26  --   GLUCOSE 99  --   BUN 19  --   CREATININE 1.43*  --   CALCIUM 9.2  --    GFR: Estimated Creatinine Clearance: 45.4 mL/min (A) (by C-G formula based on SCr of 1.43 mg/dL (H)). Liver Function Tests: Recent Labs  Lab 03/10/23 1423  AST 19  ALT 7  ALKPHOS 77  BILITOT 0.7  PROT 6.9  ALBUMIN 3.3*   No results for input(s): "LIPASE", "AMYLASE" in the last 168 hours. No results for input(s): "AMMONIA" in the last 168 hours. Coagulation Profile: No results for input(s): "INR", "PROTIME" in the last 168 hours. Cardiac Enzymes: No results for input(s): "CKTOTAL", "CKMB", "CKMBINDEX", "TROPONINI" in the last 168 hours. BNP (last 3 results) No results for input(s): "PROBNP" in the last 8760 hours. HbA1C: Recent Labs    03/10/23 1423  HGBA1C 5.5   CBG: Recent Labs  Lab 03/10/23 1954  GLUCAP 51*   Lipid Profile: Recent Labs    03/11/23 0425  CHOL 84  HDL 28*  LDLCALC 37  TRIG 94  CHOLHDL 3.0   Thyroid Function Tests: No results for input(s): "TSH", "T4TOTAL", "FREET4", "T3FREE", "THYROIDAB" in the last 72 hours. Anemia Panel: Recent Labs    03/11/23 0425  VITAMINB12 232   Sepsis Labs: No results for input(s): "PROCALCITON", "LATICACIDVEN" in the last 168 hours.  No results found for this or any previous visit (from the past 240 hours).       Radiology Studies: No results found.       Scheduled Meds:  apixaban  5 mg  Oral BID   atorvastatin  80 mg Oral Daily   Chlorhexidine Gluconate Cloth  6 each Topical Daily   chlorthalidone  25 mg Oral Daily   escitalopram  10 mg Oral Daily   folic acid  1 mg Oral Daily   magnesium oxide  400 mg Oral Daily   metoprolol succinate  25 mg Oral Daily   pantoprazole  40 mg Oral Daily   sodium chloride flush  10-40 mL Intracatheter Q12H   tamsulosin  0.4 mg Oral Daily   umeclidinium bromide  1 puff Inhalation Daily     LOS: 3 days    Time spent: 35 minutes    Katanya Schlie Hoover Brunette, DO [facility]  If 7PM-7AM, please contact night-coverage [email or web address] 03/13/2023, 2:01 PM

## 2023-03-13 NOTE — Plan of Care (Signed)
  Problem: Education: Goal: Knowledge of General Education information will improve Description: Including pain rating scale, medication(s)/side effects and non-pharmacologic comfort measures 03/13/2023 0310 by Lorane Gell, RN Outcome: Progressing 03/13/2023 0257 by Kingsley Spittle, Emmit Pomfret, RN Outcome: Progressing   Problem: Health Behavior/Discharge Planning: Goal: Ability to manage health-related needs will improve 03/13/2023 0310 by Lorane Gell, RN Outcome: Progressing 03/13/2023 0257 by Lorane Gell, RN Outcome: Progressing   Problem: Clinical Measurements: Goal: Ability to maintain clinical measurements within normal limits will improve 03/13/2023 0310 by Lorane Gell, RN Outcome: Progressing 03/13/2023 0257 by Lorane Gell, RN Outcome: Progressing Goal: Will remain free from infection 03/13/2023 0310 by Lorane Gell, RN Outcome: Progressing 03/13/2023 0257 by Lorane Gell, RN Outcome: Progressing Goal: Diagnostic test results will improve 03/13/2023 0310 by Lorane Gell, RN Outcome: Progressing 03/13/2023 0257 by Lorane Gell, RN Outcome: Progressing Goal: Respiratory complications will improve 03/13/2023 0310 by Lorane Gell, RN Outcome: Progressing 03/13/2023 0257 by Lorane Gell, RN Outcome: Progressing Goal: Cardiovascular complication will be avoided 03/13/2023 0310 by Lorane Gell, RN Outcome: Progressing 03/13/2023 0257 by Lorane Gell, RN Outcome: Progressing   Problem: Activity: Goal: Risk for activity intolerance will decrease 03/13/2023 0310 by Lorane Gell, RN Outcome: Progressing 03/13/2023 0257 by Kingsley Spittle, Emmit Pomfret, RN Outcome: Progressing   Problem: Nutrition: Goal: Adequate nutrition will be maintained 03/13/2023 0310 by Lorane Gell, RN Outcome: Progressing 03/13/2023 0257 by Lorane Gell, RN Outcome: Progressing

## 2023-03-13 NOTE — TOC Progression Note (Signed)
Transition of Care [facility]) - Progression Note    Patient Details  Name: Darryl Ward MRN: [id number] Date of Birth: [birth year]  Transition of [facility] CM/SW Contact  Villa Herb, [place] Phone Number: 03/13/2023, 11:55 AM  Clinical Narrative:    CSW reviewed bed offers with pts significant other. [facility] is only offer from the facility preferences. Pts SO states they accept bed at [facility]. CSW updated that insurance Berkley Harvey has gone to peer to peer at this time. CSW provided MD with info to complete peer to peer. TOC to follow.   Expected Discharge Plan: Skilled Nursing Facility Barriers to Discharge: Continued Medical Work up, English as a second language teacher  Expected Discharge Plan and Services In-house Referral: Clinical Social Work Discharge Planning Services: [employer] Consult Post Acute Care Choice: Skilled Nursing Facility Living arrangements for the past 2 months: Single Family Home                                       Social Determinants of Health (SDOH) Interventions SDOH Screenings   Food Insecurity: No Food Insecurity (03/10/2023)  Housing: Low Risk  (03/10/2023)  Transportation Needs: No Transportation Needs (03/10/2023)  Utilities: Not At Risk (03/10/2023)  Alcohol Screen: Low Risk  (08/30/2022)  Depression (PHQ2-9): Low Risk  (09/25/2022)  Financial Resource Strain: High Risk (08/30/2022)  Physical Activity: Insufficiently Active (08/30/2022)  Social Connections: Socially Isolated (03/10/2023)  Stress: No Stress Concern Present (08/30/2022)  Tobacco Use: Medium Risk (03/10/2023)  Health Literacy: Adequate Health Literacy (08/30/2022)    Readmission Risk Interventions    03/13/2023   11:54 AM  Readmission Risk Prevention Plan  Transportation Screening Complete  HRI or Home Care Consult Complete  Social Work Consult for Recovery Care Planning/Counseling Complete  Palliative Care Screening Not Applicable  Medication Review [employer]) Complete

## 2023-03-13 NOTE — Progress Notes (Signed)
Physical Therapy Treatment Patient Details Name: Darryl Ward MRN: [id number] DOB: 06-21-1945 Today's Date: 03/13/2023   History of Present Illness Darryl Ward is a 78 y.o. Caucasian male with medical history significant for osteoarthritis, COPD, depression, essential hypertension, lung cancer followed by Dr. Ellin Saba, dyslipidemia and coronary artery disease as well as CVA, who presented to the emergency room with acute onset of recent recurrent falls with associated dizziness for the past week without injuries.  No paresthesias or focal muscle weakness.  He admitted to vertigo without tinnitus.  No fever or chills.  No nausea or vomiting or abdominal pain.  No urinary or stool incontinence.  No dysuria, oliguria or hematuria or flank pain.  No bleeding diathesis.    PT Comments  Cueing for trunk rotation and use of handrail to assist with supine to sit in bed.  Pt with min A for transfer training and gait.  Cueing to stand within walker as tendency to push walker ahead.  Pt limited by LE weakness and fatigue, left in chair with call bell within reach.   If plan is discharge home, recommend the following:     Can travel by private vehicle        Equipment Recommendations       Recommendations for Other Services       Precautions / Restrictions Precautions Precautions: Fall Restrictions Weight Bearing Restrictions Per Provider Order: No     Mobility  Bed Mobility Overal bed mobility: Needs Assistance Bed Mobility: Supine to Sit     Supine to sit: Min assist     General bed mobility comments: slow labored effort; instructed use of side rail to assist iwth sitting on EOB    Transfers Overall transfer level: Needs assistance Equipment used: Rolling walker (2 wheels) Transfers: Sit to/from Stand Sit to Stand: Mod assist, Min assist           General transfer comment: Cueing for hand placement and elevated bed height slightly wiht min to mod A due to LE weakness     Ambulation/Gait Ambulation/Gait assistance: Min assist Gait Distance (Feet): 40 Feet Assistive device: Rolling walker (2 wheels) Gait Pattern/deviations: Decreased step length - right, Decreased step length - left, Decreased stride length, Trunk flexed Gait velocity: slow     General Gait Details: decreased WB on lT LE   Stairs             Wheelchair Mobility     Tilt Bed    Modified Rankin (Stroke Patients Only)       Balance                                            Communication    Cognition Arousal: Alert Behavior During Therapy: WFL for tasks assessed/performed   PT - Cognitive impairments: No apparent impairments, Initiation                                Cueing    Exercises      General Comments        Pertinent Vitals/Pain Pain Assessment Pain Assessment: No/denies pain    Home Living                          Prior Function  PT Goals (current goals can now be found in the care plan section)      Frequency           PT Plan      Co-evaluation              AM-PAC PT "6 Clicks" Mobility   Outcome Measure  Help needed turning from your back to your side while in a flat bed without using bedrails?: None Help needed moving from lying on your back to sitting on the side of a flat bed without using bedrails?: A Little Help needed moving to and from a bed to a chair (including a wheelchair)?: A Little Help needed standing up from a chair using your arms (e.g., wheelchair or bedside chair)?: A Lot Help needed to walk in hospital room?: A Lot Help needed climbing 3-5 steps with a railing? : A Lot 6 Click Score: 16    End of Session Equipment Utilized During Treatment: Gait belt Activity Tolerance: Patient tolerated treatment well;Patient limited by fatigue Patient left: in chair;with call bell/phone within reach Nurse Communication: Mobility status       Time:  4098-1191 PT Time Calculation (min) (ACUTE ONLY): 19 min  Charges:    $Therapeutic Activity: 8-22 mins PT General Charges $$ ACUTE PT VISIT: 1 Visit                     Becky Sax, LPTA/CLT; CBIS [phone]   Juel Burrow 03/13/2023, 12:54 PM

## 2023-03-14 ENCOUNTER — Other Ambulatory Visit: Payer: Self-pay

## 2023-03-14 ENCOUNTER — Encounter: Payer: Self-pay | Admitting: Family Medicine

## 2023-03-14 DIAGNOSIS — I639 Cerebral infarction, unspecified: Secondary | ICD-10-CM | POA: Diagnosis not present

## 2023-03-14 MED ORDER — CYCLOBENZAPRINE HCL 10 MG PO TABS: 10.0000 mg | ORAL_TABLET | Freq: Two times a day (BID) | ORAL | 0 refills | Status: DC | PRN

## 2023-03-14 MED ORDER — HEPARIN SOD (PORK) LOCK FLUSH 100 UNIT/ML IV SOLN
500.0000 [IU] | Freq: Once | INTRAVENOUS | Status: AC
  Administered 2023-03-14: 500 [IU] via INTRAVENOUS
  Filled 2023-03-14: qty 5

## 2023-03-14 MED ORDER — CHLORTHALIDONE 25 MG PO TABS
25.0000 mg | ORAL_TABLET | Freq: Every day | ORAL | 1 refills | Status: DC
  Filled 2023-03-14: qty 30, 30d supply, fill #0
  Filled 2023-04-21: qty 30, 30d supply, fill #1

## 2023-03-14 MED ORDER — MECLIZINE HCL 25 MG PO TABS
25.0000 mg | ORAL_TABLET | Freq: Three times a day (TID) | ORAL | 0 refills | Status: DC | PRN
  Filled 2023-03-14: qty 30, 10d supply, fill #0

## 2023-03-14 NOTE — Discharge Summary (Signed)
Physician Discharge Summary  Darryl Ward:[id number] DOB: [birth year] DOA: 03/10/2023  PCP: [name], DO  Admit date: 03/10/2023  Discharge date: 03/14/2023  Admitted From:Home  Disposition:  SNF  Recommendations for Outpatient Follow-up:  Follow up with PCP in 1-2 weeks Follow-up right knee edema and range of motion restriction and consider outpatient MRI and orthopedic follow-up if this does not improve with rehabilitation Continue Eliquis twice daily Continue other home medications as prior Follow-up with Dr. Ellin Saba for treatment of lung cancer  Home Health: None  Equipment/Devices: None  Discharge Condition:Stable  CODE STATUS: Full  Diet recommendation: Heart Healthy  Brief/Interim Summary: Darryl Ward is a 78 y.o. Caucasian male with medical history significant for osteoarthritis, COPD, depression, essential hypertension, lung cancer followed by Dr. Ellin Saba, dyslipidemia and coronary artery disease as well as CVA, who presented to the emergency room with acute onset of recent recurrent falls with associated dizziness for the past week without injuries.  He was admitted for suspicion of CVA and was noted to have acute ischemic CVA on MRI.  He has been assessed by neurology with recommendations to remain on Eliquis.  He is noted to have some minimal swelling to his right knee, but this does not appear concerning for anything such as septic arthritis or acute gout.  He denies any pain whatsoever and is able to weight-bear without any difficulty.  He appears stable and ready for rehab at this time.  He is to continue on medications as noted below and follow-up outpatient with Dr. Ellin Saba regarding his lung cancer and is being recommended to follow-up with orthopedics outpatient should his knee issues persist.  Discharge Diagnoses:  Principal Problem:   Acute CVA (cerebrovascular accident) [facility]) Active Problems:   Recurrent falls   GERD without esophagitis    Paroxysmal atrial fibrillation (HCC)   COPD (chronic obstructive pulmonary disease) (HCC)   Essential hypertension   Benign prostatic hyperplasia with lower urinary tract symptoms   Depression   History of lung cancer  Principal discharge diagnosis: Dizziness with recurrent falls possibly in the setting of acute CVA versus debility/deconditioning in the setting of lung cancer.  Discharge Instructions  Discharge Instructions     Diet - low sodium heart healthy   Complete by: As directed    Increase activity slowly   Complete by: As directed       Allergies as of 03/14/2023   No Known Allergies      Medication List     TAKE these medications    albuterol 108 (90 Base) MCG/ACT inhaler Commonly known as: VENTOLIN HFA Inhale 2 puffs into the lungs every 6 (six) hours as needed for wheezing or shortness of breath.   atorvastatin 80 MG tablet Commonly known as: LIPITOR Take 1 tablet (80 mg total) by mouth daily.   chlorthalidone 25 MG tablet Commonly known as: HYGROTON Take 1 tablet (25 mg total) by mouth daily. Start taking on: March 15, 2023 What changed: See the new instructions.   cyclobenzaprine 10 MG tablet Commonly known as: FLEXERIL Take 1 tablet (10 mg total) by mouth 2 (two) times daily as needed.   Eliquis 5 MG Tabs tablet Generic drug: apixaban Take 1 tablet (5 mg total) by mouth 2 (two) times daily.   escitalopram 10 MG tablet Commonly known as: LEXAPRO Take 1 tablet (10 mg total) by mouth daily.   folic acid 1 MG tablet Commonly known as: KP Folic Acid Take 1 tablet (1 mg total) by mouth  daily.   furosemide 20 MG tablet Commonly known as: LASIX Take 1 tablet (20 mg total) by mouth daily as needed.   magnesium oxide 400 (240 Mg) MG tablet Commonly known as: MAG-OX Take 1 tablet (400 mg total) by mouth daily.   meclizine 25 MG tablet Commonly known as: ANTIVERT Take 1 tablet (25 mg total) by mouth 3 (three) times daily as needed for  dizziness.   metoprolol succinate 25 MG 24 hr tablet Commonly known as: TOPROL-XL Take 1 tablet (25 mg total) by mouth daily.   pantoprazole 40 MG tablet Commonly known as: PROTONIX Take 1 tablet (40 mg total) by mouth daily.   PEMBROLIZUMAB IV Inject 200 mg into the vein every 21 ( twenty-one) days.   Spiriva Respimat 2.5 MCG/ACT Aers Generic drug: Tiotropium Bromide Monohydrate Inhale 2 puffs into the lungs daily.   tamsulosin 0.4 MG Caps capsule Commonly known as: FLOMAX Take 1 capsule (0.4 mg total) by mouth daily.        Contact information for follow-up providers     [name], DO. Schedule an appointment as soon as possible for a visit in 1 week(s).   Specialty: Family Medicine Contact information: [address] [place] [address] [phone]              Contact information for after-discharge care     Destination     [facility] .   Service: Skilled Nursing Contact information: [address] [place] [address] [phone]                    No Known Allergies  Consultations: Neurology   Procedures/Studies: CT ANGIO HEAD NECK W WO CM Result Date: 03/10/2023 CLINICAL DATA:  Neuro deficit, acute, stroke suspected. Weakness. Falling. Anticoagulated. EXAM: CT ANGIOGRAPHY HEAD AND NECK WITH AND WITHOUT CONTRAST TECHNIQUE: Multidetector CT imaging of the head and neck was performed using the standard protocol during bolus administration of intravenous contrast. Multiplanar CT image reconstructions and MIPs were obtained to evaluate the vascular anatomy. Carotid stenosis measurements (when applicable) are obtained utilizing NASCET criteria, using the distal internal carotid diameter as the denominator. RADIATION DOSE REDUCTION: This exam was performed according to the departmental dose-optimization program which includes automated exposure control, adjustment of the mA and/or kV according to patient size  and/or use of iterative reconstruction technique. CONTRAST:  75mL OMNIPAQUE IOHEXOL 350 MG/ML SOLN COMPARISON:  MRI 03/07/2023 FINDINGS: CT HEAD FINDINGS Brain: No focal abnormality seen affecting the brainstem or cerebellum. Old infarction in the right basal ganglia. Old infarction or cyst in the left basal ganglia. Chronic small-vessel ischemic changes affect the white matter. No acute stroke visible by CT. No mass, hemorrhage, hydrocephalus or extra-axial collection. Chronic right sphenoid wing meningioma appears unchanged from prior exams. Vascular: There is atherosclerotic calcification of the major vessels at the base of the brain. Skull: Negative Sinuses/Orbits: Clear/normal Other: None Review of the MIP images confirms the above findings CTA NECK FINDINGS Aortic arch: Aortic atherosclerosis.  Branching pattern is normal. Right carotid system: Common carotid artery widely patent to the bifurcation. Calcified plaque at the carotid bifurcation and ICA bulb but no stenosis. Cervical ICA widely patent beyond that. Left carotid system: Common carotid artery widely patent to the bifurcation. Calcified plaque at the carotid bifurcation and ICA bulb. Minimal diameter in the ICA bulb is 2.75 mm. Compared to a more distal cervical ICA diameter of 5 mm, this indicates a 45% stenosis. Vertebral arteries:  Dominant right vertebral artery shows calcified plaque at its origin with stenosis 30%. Beyond that, the vessel is widely patent through the cervical region to the foramen magnum. 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 Skeleton: Mild cervical spondylosis. Other neck: No mass or lymphadenopathy. Upper chest: Chronic pleural and parenchymal scarring on the right. Review of the MIP images confirms the above findings CTA HEAD FINDINGS Anterior circulation: Both internal  carotid arteries are patent through the skull base and siphon regions. There is siphon atherosclerotic calcification with stenosis estimated at 30-50% on both sides. On the right, the internal carotid artery supplies the middle cerebral artery. The A1 segment is aplastic. On the left, the carotid artery supplies the left middle cerebral artery territory and both anterior cerebral artery territories. No large vessel occlusion or proximal stenosis. Posterior circulation: Both vertebral arteries are patent through the foramen magnum to the basilar artery. Atherosclerotic plaque of the right vertebral artery V4 segment with stenosis estimated at 30%. No basilar stenosis. Superior cerebellar arteries and posterior cerebral arteries are patent. Venous sinuses: Patent and normal Anatomic variants: None other significant. Review of the MIP images confirms the above findings IMPRESSION: 1. No acute head CT finding. Old infarction in the right basal ganglia. Old infarction or cyst in the left basal ganglia. Chronic small-vessel ischemic changes of the white matter. 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 2. Aortic atherosclerosis. 3. Atherosclerotic disease at both carotid bifurcations but without stenosis on the right. 45% stenosis proximal left ICA. 4. Atherosclerotic disease at both vertebral artery origins. 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 This is a chronic finding. 5. Atherosclerotic disease in both carotid siphon regions with stenosis estimated at 30-50% on both sides. 6. Atherosclerotic disease in the right vertebral artery V4 segment with stenosis estimated at 30%. Electronically Signed   By: Paulina Fusi M.D.   On: 03/10/2023 18:51   DG Chest Portable 1 View Result Date: 03/10/2023 CLINICAL DATA:  Weakness and frequent  falls with new finding of left frontal infarction. EXAM: PORTABLE CHEST 1 VIEW COMPARISON:  Chest radiograph dated 02/26/2023 FINDINGS: Lines/tubes: Left chest wall port tip projects over the superior cavoatrial junction. Lungs: Well inflated lungs. Unchanged elevation of the right hemidiaphragm with unchanged right apical density. Postsurgical changes of the right upper lung. Unchanged left lower lung cyst. No new focal consolidations. Pleura: No pneumothorax or pleural effusion. Heart/mediastinum: The heart size and mediastinal contours are within normal limits. Bones: No acute osseous abnormality. IMPRESSION: 1. No acute disease. 2. Unchanged elevation of the right hemidiaphragm with unchanged right apical density. Electronically Signed   By: Agustin Cree M.D.   On: 03/10/2023 16:26   MR Brain W Wo Contrast Result Date: 03/07/2023 CLINICAL DATA:  Dizziness, imbalance, and falls. History of non-small cell lung cancer. EXAM: MRI HEAD WITHOUT AND WITH CONTRAST TECHNIQUE: Multiplanar, multiecho pulse sequences of the brain and surrounding structures were obtained without and with intravenous contrast. CONTRAST:  7.30mL GADAVIST GADOBUTROL 1 MMOL/ML IV SOLN COMPARISON:  Head CT 02/26/2023 and MRI 05/02/2022 FINDINGS: The study is moderately motion degraded. Brain: There is a 7 mm focus of restricted diffusion and T2 hyperintensity in the  subcortical white matter of the left superior frontal gyrus, likely with corresponding hypodensity in this location on the prior CT and most consistent with an early subacute infarct. No acute large territory infarct, intracranial hemorrhage, midline shift, or extra-axial fluid collection is identified. Patchy T2 hyperintensities elsewhere in the cerebral white matter bilaterally are similar to the prior MRI and are nonspecific but compatible with chronic small vessel ischemic disease. Chronic lacunar infarcts are again noted in the deep gray nuclei and cerebral white matter  bilaterally. There is moderate cerebral atrophy. A 2.5 x 1.6 cm meningioma along the right sphenoid wing has not significantly changed in size from a 07/11/2021 MRI. No other enhancing intracranial lesion is identified within limitations of motion artifact. Vascular: Major intracranial vascular flow voids are preserved. Skull and upper cervical spine: Unremarkable bone marrow signal. Sinuses/Orbits: Bilateral cataract extraction. Clear paranasal sinuses. Moderate bilateral mastoid effusions. Other: None. These results will be called to the ordering clinician or representative by the Radiologist Assistant, and communication documented in the PACS or [employer]. IMPRESSION: 1. Motion degraded examination. 2. Subcentimeter early subacute infarct in the left frontal white matter. 3. No evidence of intracranial metastatic disease. 4. Unchanged right sphenoid wing meningioma. 5. 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 Electronically Signed   By: [name] Ache M.D.   On: 03/07/2023 15:40   DG Pelvis Portable Result Date: 02/26/2023 CLINICAL DATA:  Fall.  Pelvic pain. EXAM: PORTABLE PELVIS 1-2 VIEWS; DG HIP (WITH OR WITHOUT PELVIS) 2-3V RIGHT COMPARISON:  None Available. 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 The arthroplasty appears intact. There is no acute fracture or dislocation. L5-S1 fusion. The soft tissues are unremarkable IMPRESSION: 1. No acute fracture or dislocation. 2. Right hip arthroplasty. Electronically Signed   By: Elgie Collard M.D.   On: 02/26/2023 16:10   DG Hip Unilat With Pelvis 2-3 Views Right Result Date: 02/26/2023 CLINICAL DATA:  Fall.  Pelvic pain. EXAM: PORTABLE PELVIS 1-2 VIEWS; DG HIP (WITH OR WITHOUT PELVIS) 2-3V RIGHT COMPARISON:  None Available. 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 The arthroplasty appears intact. There is no acute fracture or dislocation. L5-S1 fusion. The soft tissues are unremarkable IMPRESSION: 1. No acute fracture  or dislocation. 2. Right hip arthroplasty. Electronically Signed   By: Elgie Collard M.D.   On: 02/26/2023 16:10   DG Tibia/Fibula Right Result Date: 02/26/2023 CLINICAL DATA:  Larey Seat yesterday with lower leg pain EXAM: RIGHT TIBIA AND FIBULA - 2 VIEW COMPARISON:  None Available. FINDINGS: There is no evidence of fracture or other focal bone lesions. Soft tissues are unremarkable. IMPRESSION: Negative. Electronically Signed   By: Paulina Fusi M.D.   On: 02/26/2023 16:10   DG Ribs Unilateral W/Chest Left Result Date: 02/26/2023 CLINICAL DATA:  Fall and left chest wall pain. EXAM: LEFT RIBS AND CHEST - 3+ VIEW COMPARISON:  Chest radiograph dated 07/09/2022. FINDINGS: Left-sided Port-A-Cath with tip at the cavoatrial junction. There is shallow inspiration. Mild interstitial of the right hemidiaphragm. There are bibasilar atelectasis. Postsurgical changes and scarring in the right upper lobe with right apical pleural thickening. No consolidative changes. There is no pleural effusion or pneumothorax. The cardiac silhouette is within normal limits. Atherosclerotic calcification of the aorta. Mildly displaced fracture of the lateral left ninth and possibly eighth ribs. IMPRESSION: 1. Mildly displaced fracture of the lateral left ninth and possibly eighth ribs. No pneumothorax. 2. Bibasilar atelectasis. Electronically Signed   By: Elgie Collard M.D.   On: 02/26/2023 16:08  DG Knee Complete 4 Views Right Result Date: 02/26/2023 CLINICAL DATA:  Fall EXAM: RIGHT KNEE - COMPLETE 4+ VIEW COMPARISON:  None Available. FINDINGS: No evidence of fracture, dislocation, or joint effusion. No evidence of arthropathy or other focal bone abnormality. Peripheral vascular calcifications are present. Soft tissues are unremarkable. IMPRESSION: 1. No acute fracture or dislocation. 2. Peripheral vascular calcifications. Electronically Signed   By: Darliss Cheney M.D.   On: 02/26/2023 16:05   CT Head Wo Contrast Result Date:  02/26/2023 CLINICAL DATA:  Provided history: Head trauma, minor. Neck trauma. Multiple falls. EXAM: CT HEAD WITHOUT CONTRAST CT CERVICAL SPINE WITHOUT CONTRAST TECHNIQUE: Multidetector CT imaging of the head and cervical spine was performed following the standard protocol without intravenous contrast. Multiplanar CT image reconstructions of the cervical spine were also generated. RADIATION DOSE REDUCTION: This exam was performed according to the departmental dose-optimization program which includes automated exposure control, adjustment of the mA and/or kV according to patient size and/or use of iterative reconstruction technique. COMPARISON:  Prior brain MRI examinations 05/02/2022 and earlier. Head CT 05/01/2022. CT angiogram head/neck 02/04/2020. FINDINGS: CT HEAD FINDINGS: Brain: Generalized cerebral and cerebellar atrophy. Chronic infarcts within the bilateral cerebral hemispheric white matter and within/about the bilateral basal ganglia, some of which were better appreciated on prior brain MRI examinations. Moderate patchy and ill-defined hypoattenuation elsewhere within the cerebral white matter, nonspecific but compatible with chronic small vessel ischemic disease. A meningioma along the right sphenoid wing has not appreciably changed in size since the brain MRI of 07/11/2021, again measuring 2.5 x 1.7 cm on axial slices (for instance as seen on series 2, image 9). As before, the mass focally indents the underlying right temporal lobe. There is no acute intracranial hemorrhage. No demarcated cortical infarct. No extra-axial fluid collection. No midline shift. Vascular: No hyperdense vessel.  Atherosclerotic calcifications. Skull: No calvarial fracture or aggressive osseous lesion. Sinuses/Orbits: No mass or acute finding within the imaged orbits. Minimal mucosal thickening scattered within the paranasal sinuses at the imaged levels. Other: Bilateral mastoid effusions. CT CERVICAL SPINE FINDINGS Alignment: 2  mm C7-T1 grade 1 anterolisthesis. Skull base and vertebrae: The basion-dental and atlanto-dental intervals are maintained.No evidence of acute fracture to the cervical spine. Soft tissues and spinal canal: No prevertebral fluid or swelling. No visible canal hematoma. Disc levels: Cervical spondylosis with multilevel disc space narrowing, disc bulges/central disc protrusions, uncovertebral hypertrophy and facet arthrosis. Disc space narrowing is greatest at C6-C7 (advanced at this level). Multilevel spinal canal narrowing. Most notably at C6-C7, a posterior disc osteophyte complex contributes to apparent mild/moderate spinal canal stenosis. Multilevel bony neural foraminal narrowing. Upper chest: No acute finding at the imaged levels. Partially imaged left-sided central venous catheter. Other: 10 mm nodule within the left parotid gland inferiorly (series 4, image 40). IMPRESSION: CT head: 1. No evidence of an acute intracranial abnormality. 2. 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 3. Parenchymal atrophy, chronic small vessel ischemic disease and chronic infarcts, as described. 4. Minor paranasal sinus mucosal thickening at the imaged levels. 5. Bilateral mastoid effusions. CT cervical spine: 1. No evidence of an acute cervical spine fracture. 2. Mild C7-T1 grade 1 anterolisthesis. 3. Cervical spondylosis as described. 4. 10 mm nodule within the left parotid gland inferiorly suspicious for a primary parotid neoplasm. This could reflect the same lesion described on the prior CTA head/neck of 02/04/2020 or a new lesion. Correlate with medical/procedural history and consider  ENT referral. Electronically Signed   By: Jackey Loge D.O.   On: 02/26/2023 15:42   CT Cervical Spine Wo Contrast Result Date: 02/26/2023 CLINICAL DATA:  Provided history: Head trauma, minor. Neck trauma. Multiple falls. EXAM: CT HEAD WITHOUT CONTRAST CT CERVICAL SPINE  WITHOUT CONTRAST TECHNIQUE: Multidetector CT imaging of the head and cervical spine was performed following the standard protocol without intravenous contrast. Multiplanar CT image reconstructions of the cervical spine were also generated. RADIATION DOSE REDUCTION: This exam was performed according to the departmental dose-optimization program which includes automated exposure control, adjustment of the mA and/or kV according to patient size and/or use of iterative reconstruction technique. COMPARISON:  Prior brain MRI examinations 05/02/2022 and earlier. Head CT 05/01/2022. CT angiogram head/neck 02/04/2020. FINDINGS: CT HEAD FINDINGS: Brain: Generalized cerebral and cerebellar atrophy. Chronic infarcts within the bilateral cerebral hemispheric white matter and within/about the bilateral basal ganglia, some of which were better appreciated on prior brain MRI examinations. Moderate patchy and ill-defined hypoattenuation elsewhere within the cerebral white matter, nonspecific but compatible with chronic small vessel ischemic disease. A meningioma along the right sphenoid wing has not appreciably changed in size since the brain MRI of 07/11/2021, again measuring 2.5 x 1.7 cm on axial slices (for instance as seen on series 2, image 9). As before, the mass focally indents the underlying right temporal lobe. There is no acute intracranial hemorrhage. No demarcated cortical infarct. No extra-axial fluid collection. No midline shift. Vascular: No hyperdense vessel.  Atherosclerotic calcifications. Skull: No calvarial fracture or aggressive osseous lesion. Sinuses/Orbits: No mass or acute finding within the imaged orbits. Minimal mucosal thickening scattered within the paranasal sinuses at the imaged levels. Other: Bilateral mastoid effusions. CT CERVICAL SPINE FINDINGS Alignment: 2 mm C7-T1 grade 1 anterolisthesis. Skull base and vertebrae: The basion-dental and atlanto-dental intervals are maintained.No evidence of acute  fracture to the cervical spine. Soft tissues and spinal canal: No prevertebral fluid or swelling. No visible canal hematoma. Disc levels: Cervical spondylosis with multilevel disc space narrowing, disc bulges/central disc protrusions, uncovertebral hypertrophy and facet arthrosis. Disc space narrowing is greatest at C6-C7 (advanced at this level). Multilevel spinal canal narrowing. Most notably at C6-C7, a posterior disc osteophyte complex contributes to apparent mild/moderate spinal canal stenosis. Multilevel bony neural foraminal narrowing. Upper chest: No acute finding at the imaged levels. Partially imaged left-sided central venous catheter. Other: 10 mm nodule within the left parotid gland inferiorly (series 4, image 40). IMPRESSION: CT head: 1. No evidence of an acute intracranial abnormality. 2. 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 3. Parenchymal atrophy, chronic small vessel ischemic disease and chronic infarcts, as described. 4. Minor paranasal sinus mucosal thickening at the imaged levels. 5. Bilateral mastoid effusions. CT cervical spine: 1. No evidence of an acute cervical spine fracture. 2. Mild C7-T1 grade 1 anterolisthesis. 3. Cervical spondylosis as described. 4. 10 mm nodule within the left parotid gland inferiorly suspicious for a primary parotid neoplasm. This could reflect the same lesion described on the prior CTA head/neck of 02/04/2020 or a new lesion. Correlate with medical/procedural history and consider ENT referral. Electronically Signed   By: Jackey Loge D.O.   On: 02/26/2023 15:42     Discharge Exam: Vitals:   03/14/23 0941 03/14/23 1003  BP: 95/66 98/68  Pulse: (!) 103 82  Resp: 18 20  Temp:  98.2 F (36.8 C)  SpO2:  99%   Vitals:   03/14/23  0013 03/14/23 0502 03/14/23 0941 03/14/23 1003  BP: 106/66 121/72 95/66 98/68   Pulse: 73 81 (!) 103 82  Resp: 20 18 18 20   Temp: (!) 97.4 F (36.3 C)  (!) 97.4 F (36.3 C)  98.2 F (36.8 C)  TempSrc:    Oral  SpO2: 98% 95%  99%  Weight:      Height:        General: Pt is alert, awake, not in acute distress Cardiovascular: RRR, S1/S2 +, no rubs, no gallops Respiratory: CTA bilaterally, no wheezing, no rhonchi Abdominal: Soft, NT, ND, bowel sounds + Extremities: no edema, no cyanosis    The results of significant diagnostics from this hospitalization (including imaging, microbiology, ancillary and laboratory) are listed below for reference.     Microbiology: No results found for this or any previous visit (from the past 240 hours).   Labs: BNP (last 3 results) No results for input(s): "BNP" in the last 8760 hours. Basic Metabolic Panel: Recent Labs  Lab 03/10/23 1423 03/10/23 1711  NA 142  --   K 5.2* 4.3  CL 106  --   CO2 26  --   GLUCOSE 99  --   BUN 19  --   CREATININE 1.43*  --   CALCIUM 9.2  --    Liver Function Tests: Recent Labs  Lab 03/10/23 1423  AST 19  ALT 7  ALKPHOS 77  BILITOT 0.7  PROT 6.9  ALBUMIN 3.3*   No results for input(s): "LIPASE", "AMYLASE" in the last 168 hours. No results for input(s): "AMMONIA" in the last 168 hours. CBC: Recent Labs  Lab 03/10/23 1423  WBC 7.3  NEUTROABS 5.0  HGB 11.8*  HCT 36.8*  MCV 96.1  PLT 163   Cardiac Enzymes: No results for input(s): "CKTOTAL", "CKMB", "CKMBINDEX", "TROPONINI" in the last 168 hours. BNP: Invalid input(s): "POCBNP" CBG: Recent Labs  Lab 03/10/23 1954  GLUCAP 51*   D-Dimer No results for input(s): "DDIMER" in the last 72 hours. Hgb A1c No results for input(s): "HGBA1C" in the last 72 hours. Lipid Profile No results for input(s): "CHOL", "HDL", "LDLCALC", "TRIG", "CHOLHDL", "LDLDIRECT" in the last 72 hours. Thyroid function studies No results for input(s): "TSH", "T4TOTAL", "T3FREE", "THYROIDAB" in the last 72 hours.  Invalid input(s): "FREET3" Anemia work up No results for input(s): "VITAMINB12", "FOLATE",  "FERRITIN", "TIBC", "IRON", "RETICCTPCT" in the last 72 hours. Urinalysis    Component Value Date/Time   COLORURINE YELLOW 03/10/2023 2009   APPEARANCEUR CLEAR 03/10/2023 2009   LABSPEC 1.031 (H) 03/10/2023 2009   PHURINE 7.0 03/10/2023 2009   GLUCOSEU NEGATIVE 03/10/2023 2009   HGBUR NEGATIVE 03/10/2023 2009   BILIRUBINUR NEGATIVE 03/10/2023 2009   KETONESUR NEGATIVE 03/10/2023 2009   PROTEINUR NEGATIVE 03/10/2023 2009   UROBILINOGEN 1.0 01/10/2012 1728   NITRITE NEGATIVE 03/10/2023 2009   LEUKOCYTESUR NEGATIVE 03/10/2023 2009   Sepsis Labs Recent Labs  Lab 03/10/23 1423  WBC 7.3   Microbiology No results found for this or any previous visit (from the past 240 hours).   Time coordinating discharge: 35 minutes  SIGNED:   Erick Blinks, DO [facility] 03/14/2023, 10:18 AM  If 7PM-7AM, please contact night-coverage [email or web address]

## 2023-03-14 NOTE — TOC Transition Note (Signed)
Transition of Care [facility]) - Discharge Note   Patient Details  Name: Darryl Ward MRN: [id number] Date of Birth: 1945-03-14  Transition of Care [facility]) CM/SW Contact:  Villa Herb, LCSWA Phone Number: 03/14/2023, 10:59 AM  Clinical Narrative:    CSW updated that insurance Berkley Harvey has been approved. CSW spoke to Casimiro Needle in admissions at [facility] who states they are able to accept pt today. D/C clinicals sent to facility via HUB. CSW provided RN with room and report numbers. CSW spoke to [name] (pts SO) who is understanding and agreeable to [employer]. CSW set up ride for pt. TOC signing off.   Final next level of care: Skilled Nursing Facility Barriers to Discharge: Barriers Resolved   Patient Goals and CMS Choice Patient states their goals for this hospitalization and ongoing recovery are:: Go to SNF CMS Medicare.gov Compare Post Acute Care list provided to:: Patient Choice offered to / list presented to : Patient [place] ownership interest in [facility].provided to:: Patient    Discharge Placement              Patient [facility] bed at: [facility] Patient to be transferred to facility by: [place] Name of family member notified: [name] Patient and family notified of of transfer: 03/14/23  Discharge Plan and Services Additional resources added to the After Visit Summary for   In-house Referral: Clinical Social Work Discharge Planning Services: CM Consult Post Acute Care Choice: Skilled Nursing Facility                               Social Drivers of Health (SDOH) Interventions SDOH Screenings   Food Insecurity: No Food Insecurity (03/10/2023)  Housing: Low Risk  (03/10/2023)  Transportation Needs: No Transportation Needs (03/10/2023)  Utilities: Not At Risk (03/10/2023)  Alcohol Screen: Low Risk  (08/30/2022)  Depression (PHQ2-9): Low Risk  (09/25/2022)  Financial Resource Strain: High Risk (08/30/2022)  Physical Activity: Insufficiently Active  (08/30/2022)  Social Connections: Socially Isolated (03/10/2023)  Stress: No Stress Concern Present (08/30/2022)  Tobacco Use: Medium Risk (03/10/2023)  Health Literacy: Adequate Health Literacy (08/30/2022)     Readmission Risk Interventions    03/13/2023   11:54 AM  Readmission Risk Prevention Plan  Transportation Screening Complete  HRI or Home Care Consult Complete  Social Work Consult for Recovery Care Planning/Counseling Complete  Palliative Care Screening Not Applicable  Medication Review Oceanographer) Complete

## 2023-03-17 ENCOUNTER — Inpatient Hospital Stay: Payer: HMO | Admitting: Family Medicine

## 2023-03-21 ENCOUNTER — Other Ambulatory Visit: Payer: Self-pay

## 2023-03-27 ENCOUNTER — Inpatient Hospital Stay: Payer: HMO

## 2023-03-27 ENCOUNTER — Inpatient Hospital Stay: Payer: HMO | Admitting: Hematology

## 2023-03-28 ENCOUNTER — Encounter: Payer: Self-pay | Admitting: Hematology

## 2023-03-31 ENCOUNTER — Encounter: Payer: Self-pay | Admitting: Neurology

## 2023-03-31 ENCOUNTER — Other Ambulatory Visit: Payer: Self-pay

## 2023-03-31 ENCOUNTER — Ambulatory Visit (INDEPENDENT_AMBULATORY_CARE_PROVIDER_SITE_OTHER): Admitting: Neurology

## 2023-03-31 ENCOUNTER — Other Ambulatory Visit (HOSPITAL_COMMUNITY): Payer: Self-pay

## 2023-03-31 VITALS — BP 107/64 | HR 86 | Ht 70.0 in | Wt 185.6 lb

## 2023-03-31 DIAGNOSIS — R32 Unspecified urinary incontinence: Secondary | ICD-10-CM | POA: Insufficient documentation

## 2023-03-31 DIAGNOSIS — R269 Unspecified abnormalities of gait and mobility: Secondary | ICD-10-CM | POA: Diagnosis not present

## 2023-03-31 DIAGNOSIS — I639 Cerebral infarction, unspecified: Secondary | ICD-10-CM

## 2023-03-31 MED ORDER — ALPRAZOLAM 0.5 MG PO TABS
ORAL_TABLET | ORAL | 0 refills | Status: DC
  Filled 2023-03-31: qty 3, 1d supply, fill #0

## 2023-03-31 NOTE — Progress Notes (Signed)
 Chief Complaint  Patient presents with   New Patient (Initial Visit)    [id number], girlfriend present [name]),  NP internal referral for CVA, Dizziness, Imbalance: orthostatic bp unable to be completed as pt is a fall risk. Unable to stand without walker, gait abnormality. When standing gets dizziness.       ASSESSMENT AND PLAN  Darryl Ward is a 78 y.o. 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 Adriana Simas, Dorie Rank for evaluation of fall, MRI showed stroke, initial evaluation was on March 10th 2025.  History is obtained from the patient and review of electronic medical records. I personally reviewed pertinent available imaging films in PACS.   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 Lumbar decompression surgery,   He lives with his partner [name] at home prior to hospital admission on March 10, 2023,  He has a history of right upper lobe adenoma, status post right upper lobectomy and lymph node dissection, taking adjuvant Keytruda treatment,  He had a history of lumbar decompression surgery,  fell suffered right hip fracture  Lives with at home, not driving, has to have assiatnce to walk, walker before hosptial since 2024, in April 2024, required hip replacement,  Following that he has slow decline, sometimes could not recognize his family, also had worsening depression anxiety, confusion, gait abnormality, was able to ambulate with walker, then in February has increased to fall, could barely transfer himself  Had MRI of the brain on March 07, 2023, subcentimeter acute infarction at the left frontal white matter, no evidence of intracranial metastatic disease, moderate small vessel disease, cerebral atrophy, stable 2.5 x 1.6 meningioma along the right sphenoid wing  Following that hospital admission, he was discharged to rehabilitation, continue have gait abnormality  [name] also reported that he has slow worsening bowel and bladder incontinence over the past few weeks,  PHYSICAL EXAM:   Vitals:   03/31/23 1016  BP: 107/64  Pulse: 86  Weight: 185 lb 10 oz (84.2 kg)  Height: 5\' 10"  (1.778 m)   Not recorded     Body mass index is 26.63 kg/m.  PHYSICAL EXAMNIATION:  Gen: NAD, conversant, well nourised, well groomed  Cardiovascular: Regular rate rhythm, no peripheral edema, warm, nontender. Eyes:  Conjunctivae clear without exudates or hemorrhage Neck: Supple, no carotid bruits. Pulmonary: Clear to auscultation bilaterally   NEUROLOGICAL EXAM:  MENTAL STATUS: Speech/cognition: Depressed looking elderly male, hard of hearing, rely on his family to provide history, cooperative on examination awake, coarse voice CRANIAL NERVES: CN II: Visual fields are full to confrontation. Pupils are round equal and briskly reactive to light. CN III, IV, VI: extraocular movement are normal. No ptosis. CN V: Facial sensation is intact to light touch CN VII: Face is symmetric with normal eye closure  CN VIII: Hard of hearing CN IX, X: Phonation is normal. CN XI: Head turning and shoulder shrug are intact  MOTOR: Upper extremity motor strength is normal, mild right hip flexion knee flexion extension right ankle dorsiflexion weakness  REFLEXES: Reflexes are 3 and symmetric at the biceps, triceps, knees, and ankles. Plantar responses are extensor bilaterally  SENSORY: Intact to light touch, pinprick  COORDINATION: There is no trunk or limb dysmetria noted.  GAIT/STANCE: Need help to get up from seated position, rely on support, dragging right leg, unsteady,  REVIEW OF SYSTEMS:  Full 14 system review of systems performed and notable only for as above All other review of systems were negative.   ALLERGIES: No Known Allergies  HOME MEDICATIONS: Current Outpatient Medications  Medication Sig Dispense Refill   albuterol (VENTOLIN HFA) 108 (90 Base) MCG/ACT inhaler Inhale 2 puffs into the lungs every 6 (six) hours as needed for wheezing or shortness of breath. 6.7 g 6   apixaban (ELIQUIS) 5 MG TABS tablet Take 1 tablet (5 mg total) by mouth 2 (two) times daily. 180 tablet 3   atorvastatin (LIPITOR) 80 MG tablet Take 1 tablet (80 mg total) by mouth daily. 90 tablet 3   chlorthalidone (HYGROTON) 25 MG tablet Take 1 tablet (25 mg total) by mouth daily. 30 tablet 1   Cholecalciferol 50 MCG (2000  UT) CAPS Take 1 capsule by mouth daily.     cyclobenzaprine (FLEXERIL) 10 MG tablet Take 1 tablet (10 mg total) by mouth 2 (two) times daily as needed. 10 tablet 0   escitalopram (LEXAPRO) 10 MG tablet Take 1 tablet (10 mg total) by mouth daily. 90 tablet 3   folic acid (KP FOLIC ACID) 1 MG tablet Take 1 tablet (1 mg total) by mouth daily. 90 tablet 3   furosemide (LASIX) 20 MG tablet Take 1 tablet (20 mg total) by mouth daily as needed. 90 tablet 2   magnesium oxide (MAG-OX) 400 (240 Mg) MG tablet Take 1 tablet (400 mg total) by mouth daily. 120 tablet 3   meclizine (ANTIVERT) 25 MG tablet Take 1 tablet (25 mg total) by mouth 3 (three) times daily as needed for dizziness. 30 tablet 0   metoprolol succinate (TOPROL-XL) 25 MG 24 hr tablet Take 1 tablet (25 mg total) by mouth daily. 90 tablet 3   pantoprazole (PROTONIX) 40 MG tablet Take 1 tablet (40 mg total) by mouth daily. 90 tablet 3   tamsulosin (FLOMAX) 0.4 MG CAPS capsule Take 1 capsule (0.4 mg total) by mouth daily. 90 capsule 3   umeclidinium bromide (INCRUSE ELLIPTA) 62.5 MCG/ACT AEPB Inhale 1 puff into the lungs daily.     No current facility-administered medications for this visit.   Facility-Administered Medications Ordered in Other Visits  Medication Dose Route Frequency Provider Last Rate Last Admin   sodium chloride flush (NS) 0.9 % injection 10 mL  10 mL  Intracatheter PRN [name], MD   10 mL at 07/10/22 1513    PAST MEDICAL HISTORY: Past Medical History:  Diagnosis Date   AAA (abdominal aortic aneurysm) (HCC)    Arthritis    Cancer (HCC)    Carotid artery disease (HCC)    Nonobstructive   Cataract    COPD (chronic obstructive pulmonary disease) (HCC)    Coronary atherosclerosis of native coronary artery    PTCA small diagonal 2007 otherwise nonobstructive CAD   Depression    Dysrhythmia    Essential hypertension, benign    Hyperlipidemia    NSTEMI (non-ST elevated myocardial infarction) (HCC) 2007    Stroke [facility]) 2004   TIA (transient ischemic attack) 2006    PAST SURGICAL HISTORY: Past Surgical History:  Procedure Laterality Date   AORTA - BILATERAL FEMORAL ARTERY BYPASS GRAFT  01/08/2012   Procedure: AORTA BIFEMORAL BYPASS GRAFT;  Surgeon: Sherren Kerns, MD;  Location: [facility] OR;  Service: Vascular;  Laterality: Bilateral;  using 18x68mm x 40cm Hemashield Gold Vascular Graft with Endarterectomy, Thombectomy and  Reimplantation of Inferior Mesenteric Artery   BACK SURGERY  2021   BRONCHIAL BIOPSY  06/11/2021   Procedure: BRONCHIAL BIOPSIES;  Surgeon: Leslye Peer, MD;  Location: [facility] ENDOSCOPY;  Service: Pulmonary;;   BRONCHIAL BRUSHINGS  06/11/2021   Procedure: BRONCHIAL BRUSHINGS;  Surgeon: Leslye Peer, MD;  Location: [facility] ENDOSCOPY;  Service: Pulmonary;;   BRONCHIAL NEEDLE ASPIRATION BIOPSY  06/11/2021   Procedure: BRONCHIAL NEEDLE ASPIRATION BIOPSIES;  Surgeon: Leslye Peer, MD;  Location: [facility] ENDOSCOPY;  Service: Pulmonary;;   CARDIOVERSION N/A 09/20/2021   Procedure: CARDIOVERSION;  Surgeon: [name], MD;  Location: [facility] ORS;  Service: Cardiovascular;  Laterality: N/A;   COLONOSCOPY N/A 04/14/2019   Procedure: COLONOSCOPY;  Surgeon: Corbin Ade, MD;  Location: [facility] ENDO SUITE;  Service: Endoscopy;  Laterality: N/A;  9:30   COLONOSCOPY WITH PROPOFOL N/A 07/25/2021   Procedure: COLONOSCOPY WITH PROPOFOL;  Surgeon: Lanelle Bal, DO;  Location: [facility] ENDO SUITE;  Service: Endoscopy;  Laterality: N/A;  1:00pm   FIDUCIAL MARKER PLACEMENT  06/11/2021   Procedure: FIDUCIAL MARKER PLACEMENT;  Surgeon: Leslye Peer, MD;  Location: [facility] ENDOSCOPY;  Service: Pulmonary;;   HIP ARTHROPLASTY Right 04/28/2022   Procedure: ARTHROPLASTY BIPOLAR HIP (HEMIARTHROPLASTY);  Surgeon: Sheral Apley, MD;  Location: [facility] OR;  Service: Orthopedics;  Laterality: Right;   INTERCOSTAL NERVE BLOCK Right 11/12/2021   Procedure: INTERCOSTAL NERVE BLOCK;  Surgeon: Loreli Slot, MD;   [facility] OR;  Service: Thoracic;  Laterality: Right;   IR IMAGING GUIDED PORT INSERTION  07/31/2021   Left cataract surgery     LYMPH NODE DISSECTION Right 11/12/2021   Procedure: LYMPH NODE DISSECTION;  Surgeon: Loreli Slot, MD;  Location: [facility] OR;  Service: Thoracic;  Laterality: Right;   POLYPECTOMY  04/14/2019   Procedure: POLYPECTOMY;  Surgeon: Corbin Ade, MD;  [facility] END[facility];  Service: Endoscopy;;   POLYPECTOMY  07/25/2021   Procedure: POLYPECTOMY;  Surgeon: Lanelle Bal, DO;  Location[facility] ENDO SU[facility];  Service: Endoscopy;;   TEE WITHOUT CARDIOVERSION N/A 09/20/2021   Procedure: TRANSESOPHAGEAL ECHOCARDIOGRAM (TEE);  Surgeon: [name], MD;  Location: [facility] ORS;  Service: Cardiovascular;  Laterality: N/A;   TRANSFORAMINAL LUMBAR INTERBODY FUSION (TLIF) WITH PEDICLE SCREW FIXATION 1 LEVEL N/A 04/27/2020   Procedure: Transforaminal Lumbar Interbody Fusion Lumbar Five-Sacral One;  Surgeon: Bedelia Person, MD;  Location: [facility] OR;  Service: Neurosurgery;  Laterality:  N/A;   VIDEO BRONCHOSCOPY WITH INSERTION OF INTERBRONCHIAL VALVE (IBV) N/A 11/22/2021   Procedure: VIDEO BRONCHOSCOPY WITH INSERTION OF INTERBRONCHIAL VALVE (IBV);  Surgeon: Loreli Slot, MD;  Location: [facility] OR;  Service: Thoracic;  Laterality: N/A;   VIDEO BRONCHOSCOPY WITH INSERTION OF INTERBRONCHIAL VALVE (IBV) N/A 01/24/2022   Procedure: VIDEO BRONCHOSCOPY WITH REMOVAL OF INTERBRONCHIAL VALVE (IBV);  Surgeon: Loreli Slot, MD;  Location: [facility] OR;  Service: Thoracic;  Laterality: N/A;   VIDEO BRONCHOSCOPY WITH RADIAL ENDOBRONCHIAL ULTRASOUND  06/11/2021   Procedure: VIDEO BRONCHOSCOPY WITH RADIAL ENDOBRONCHIAL ULTRASOUND;  Surgeon: Leslye Peer, MD;  Location: [facility] ENDOSCOPY;  Service: Pulmonary;;    FAMILY HISTORY: Family History  Problem Relation Age of Onset   Hyperlipidemia Sister    Heart attack Brother 69   Cancer - Colon Neg Hx     SOCIAL HISTORY: Social History    Socioeconomic History   Marital status: Divorced    Spouse name: Not on file   Number of children: 1   Years of education: 11   Highest education level: 11th grade  Occupational History    Employer: Engineer, materials  Tobacco Use   Smoking status: Former    Current packs/day: 0.00    Average packs/day: 1 pack/day for 40.0 years (40.0 ttl pk-yrs)    Types: Cigarettes    Start date: 07/1981    Quit date: 07/2021    Years since quitting: 1.6   Smokeless tobacco: Never   Tobacco comments:    1 pack of cigarettes smoked daily. 07/17/21 ARJ, RN   Vaping Use   Vaping status: Never Used  Substance and Sexual Activity   Alcohol use: No    Comment: Prior history of regular alcohol use   Drug use: No   Sexual activity: Not Currently  Other Topics Concern   Not on file  Social History Narrative   Not on file   Social Drivers of Health   Financial Resource Strain: High Risk (08/30/2022)   Overall Financial Resource Strain (CARDIA)    Difficulty of Paying Living Expenses: Hard  Food Insecurity: No Food Insecurity (03/10/2023)   Hunger Vital Sign    Worried About Running Out of Food in the Last Year: Never true    Ran Out of Food in the Last Year: Never true  Transportation Needs: No Transportation Needs (03/10/2023)   PRAPARE - Administrator, Civil Service (Medical): No    Lack of Transportation (Non-Medical): No  Physical Activity: Insufficiently Active (08/30/2022)   Exercise Vital Sign    Days of Exercise per Week: 7 days    Minutes of Exercise per Session: 20 min  Stress: No Stress Concern Present (08/30/2022)   [employer] of Occupational Health - Occupational Stress Questionnaire    Feeling of Stress : Not at all  Social Connections: Socially Isolated (03/10/2023)   Social Connection and Isolation Panel [NHANES]    Frequency of Communication with Friends and Family: Twice a week    Frequency of Social Gatherings with Friends and Family: Twice a week     Attends Religious Services: Never    Database administrator or Organizations: No    Attends Banker Meetings: Never    Marital Status: Divorced  Catering manager Violence: Not At Risk (03/10/2023)   Humiliation, Afraid, Rape, and Kick questionnaire    Fear of Current or Ex-Partner: No    Emotionally Abused: No    Physically Abused: No    Sexually Abused:  No      Levert Feinstein, M.D. Ph.D.  [facility] [address], [address] [place], [place] [address] Ph: [phone] Fax: [phone]  CC:  [name], MD [address] [place],  [place] [address]  [name], DO

## 2023-04-01 ENCOUNTER — Encounter: Payer: Self-pay | Admitting: Hematology

## 2023-04-03 ENCOUNTER — Inpatient Hospital Stay: Payer: HMO

## 2023-04-03 ENCOUNTER — Inpatient Hospital Stay: Payer: HMO | Attending: Hematology

## 2023-04-03 ENCOUNTER — Inpatient Hospital Stay (HOSPITAL_BASED_OUTPATIENT_CLINIC_OR_DEPARTMENT_OTHER): Payer: HMO | Admitting: Hematology

## 2023-04-03 VITALS — BP 119/75 | HR 65 | Temp 98.1°F | Resp 16 | Wt 171.7 lb

## 2023-04-03 DIAGNOSIS — C3411 Malignant neoplasm of upper lobe, right bronchus or lung: Secondary | ICD-10-CM | POA: Diagnosis present

## 2023-04-03 DIAGNOSIS — Z7901 Long term (current) use of anticoagulants: Secondary | ICD-10-CM | POA: Diagnosis not present

## 2023-04-03 DIAGNOSIS — I10 Essential (primary) hypertension: Secondary | ICD-10-CM | POA: Diagnosis not present

## 2023-04-03 DIAGNOSIS — I700 Atherosclerosis of aorta: Secondary | ICD-10-CM | POA: Insufficient documentation

## 2023-04-03 DIAGNOSIS — Z8673 Personal history of transient ischemic attack (TIA), and cerebral infarction without residual deficits: Secondary | ICD-10-CM | POA: Insufficient documentation

## 2023-04-03 DIAGNOSIS — I2510 Atherosclerotic heart disease of native coronary artery without angina pectoris: Secondary | ICD-10-CM | POA: Insufficient documentation

## 2023-04-03 DIAGNOSIS — D649 Anemia, unspecified: Secondary | ICD-10-CM | POA: Insufficient documentation

## 2023-04-03 DIAGNOSIS — E785 Hyperlipidemia, unspecified: Secondary | ICD-10-CM | POA: Insufficient documentation

## 2023-04-03 DIAGNOSIS — Z95828 Presence of other vascular implants and grafts: Secondary | ICD-10-CM

## 2023-04-03 DIAGNOSIS — I252 Old myocardial infarction: Secondary | ICD-10-CM | POA: Insufficient documentation

## 2023-04-03 DIAGNOSIS — J449 Chronic obstructive pulmonary disease, unspecified: Secondary | ICD-10-CM | POA: Insufficient documentation

## 2023-04-03 DIAGNOSIS — D329 Benign neoplasm of meninges, unspecified: Secondary | ICD-10-CM | POA: Insufficient documentation

## 2023-04-03 DIAGNOSIS — I4891 Unspecified atrial fibrillation: Secondary | ICD-10-CM | POA: Insufficient documentation

## 2023-04-03 DIAGNOSIS — Z800 Family history of malignant neoplasm of digestive organs: Secondary | ICD-10-CM | POA: Insufficient documentation

## 2023-04-03 DIAGNOSIS — I6523 Occlusion and stenosis of bilateral carotid arteries: Secondary | ICD-10-CM | POA: Diagnosis not present

## 2023-04-03 DIAGNOSIS — M47812 Spondylosis without myelopathy or radiculopathy, cervical region: Secondary | ICD-10-CM | POA: Diagnosis not present

## 2023-04-03 DIAGNOSIS — Z9221 Personal history of antineoplastic chemotherapy: Secondary | ICD-10-CM | POA: Diagnosis not present

## 2023-04-03 DIAGNOSIS — Z87891 Personal history of nicotine dependence: Secondary | ICD-10-CM | POA: Diagnosis not present

## 2023-04-03 LAB — CBC WITH DIFFERENTIAL/PLATELET
Abs Immature Granulocytes: 0.04 10*3/uL (ref 0.00–0.07)
Basophils Absolute: 0.1 10*3/uL (ref 0.0–0.1)
Basophils Relative: 1 %
Eosinophils Absolute: 0.2 10*3/uL (ref 0.0–0.5)
Eosinophils Relative: 3 %
HCT: 35.7 % — ABNORMAL LOW (ref 39.0–52.0)
Hemoglobin: 12.2 g/dL — ABNORMAL LOW (ref 13.0–17.0)
Immature Granulocytes: 0 %
Lymphocytes Relative: 14 %
Lymphs Abs: 1.3 10*3/uL (ref 0.7–4.0)
MCH: 30.7 pg (ref 26.0–34.0)
MCHC: 34.2 g/dL (ref 30.0–36.0)
MCV: 89.9 fL (ref 80.0–100.0)
Monocytes Absolute: 0.7 10*3/uL (ref 0.1–1.0)
Monocytes Relative: 8 %
Neutro Abs: 6.9 10*3/uL (ref 1.7–7.7)
Neutrophils Relative %: 74 %
Platelets: 212 10*3/uL (ref 150–400)
RBC: 3.97 MIL/uL — ABNORMAL LOW (ref 4.22–5.81)
RDW: 13.1 % (ref 11.5–15.5)
WBC: 9.2 10*3/uL (ref 4.0–10.5)
nRBC: 0 % (ref 0.0–0.2)

## 2023-04-03 LAB — COMPREHENSIVE METABOLIC PANEL
ALT: 15 U/L (ref 0–44)
AST: 21 U/L (ref 15–41)
Albumin: 3.1 g/dL — ABNORMAL LOW (ref 3.5–5.0)
Alkaline Phosphatase: 86 U/L (ref 38–126)
Anion gap: 11 (ref 5–15)
BUN: 25 mg/dL — ABNORMAL HIGH (ref 8–23)
CO2: 27 mmol/L (ref 22–32)
Calcium: 9.1 mg/dL (ref 8.9–10.3)
Chloride: 96 mmol/L — ABNORMAL LOW (ref 98–111)
Creatinine, Ser: 1.5 mg/dL — ABNORMAL HIGH (ref 0.61–1.24)
GFR, Estimated: 48 mL/min — ABNORMAL LOW (ref >60)
Glucose, Bld: 101 mg/dL — ABNORMAL HIGH (ref 70–99)
Potassium: 3.1 mmol/L — ABNORMAL LOW (ref 3.5–5.1)
Sodium: 134 mmol/L — ABNORMAL LOW (ref 135–145)
Total Bilirubin: 0.7 mg/dL (ref 0.0–1.2)
Total Protein: 7.2 g/dL (ref 6.5–8.1)

## 2023-04-03 LAB — MAGNESIUM: Magnesium: 2.2 mg/dL (ref 1.7–2.4)

## 2023-04-03 LAB — TSH: TSH: 2.997 u[IU]/mL (ref 0.350–4.500)

## 2023-04-03 MED ORDER — SODIUM CHLORIDE 0.9% FLUSH
10.0000 mL | INTRAVENOUS | Status: DC | PRN
Start: 2023-04-03 — End: 2023-04-03
  Administered 2023-04-03: 10 mL via INTRAVENOUS

## 2023-04-03 MED ORDER — HEPARIN SOD (PORK) LOCK FLUSH 100 UNIT/ML IV SOLN
500.0000 [IU] | Freq: Once | INTRAVENOUS | Status: AC
  Administered 2023-04-03: 500 [IU] via INTRAVENOUS

## 2023-04-03 NOTE — Patient Instructions (Signed)

## 2023-04-03 NOTE — Progress Notes (Signed)
 Treatment discontinued per Dr. Ellin Saba.  Patients port flushed without difficulty.  Good blood return noted with no bruising or swelling noted at site.  Band aid applied.  VSS with discharge and left in satisfactory condition with no s/s of distress noted.

## 2023-04-03 NOTE — Progress Notes (Signed)
 [facility] [address] [place] [address]    Clinic Day:  04/03/23  Referring physician: [name], DO  Patient Care Team: [name], DO as PCP - General (Family Medicine) [name], MD as PCP - Cardiology (Cardiology) [name], MD as Consulting Physician (Gastroenterology) [name], RN as Oncology Nurse Navigator (Medical Oncology) [name], MD as Medical Oncologist (Medical Oncology) [name], MD as Consulting Physician (Hematology)   ASSESSMENT & PLAN:   Assessment: 1. 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  Occasional additional small pulmonary nodules measuring 0.4 cm and smaller. - Bronchoscopy (06/11/2021): RUL nodule brushing and FNA: Malignant cells with features of adenocarcinoma. - PET scan (06/22/2021): Subsolid nodular lesion in the right upper lobe, hypermetabolic SUV 15.  Small right hilar and infrahilar lymph nodes with SUV 4.03 concerning for metastatic adenopathy.  Small hypermetabolic left parotid gland lesion, consistent with previous history of Wharton's tumor.  Hypermetabolic focus in the transverse colon SUV 8.69. - MRI of the brain (07/11/2021): No evidence of metastatic disease.  Right sphenoid wing meningioma stable since 2022. - He was evaluated by Dr. Dorris Fetch and was recommended to undergo neoadjuvant chemoimmunotherapy followed by surgical resection. - 3 cycles of carboplatin, pemetrexed from 08/06/2021 through 10/02/2021 (opdivo given with only cycle 2, discontinued for cycle 3 due to cardiac problems) - NGS testing not performed due to insufficient sample. - Guardant360: Negative for EGFR and ALK.  MSI high not detected. - Right upper lobectomy and lymph node dissection (11/12/2021): 1.2 cm invasive adenocarcinoma, moderately differentiated, margins  negative, pT1b pN0, 0/17 lymph nodes positive from stations 4R, 7, 8, 9, 10R, 11 R and 12 R.  Negative for visceral pleural involvement, LVI.  PD-L1 TPS is 1%. - Adjuvant pembrolizumab started on 03/11/2022.  Pembrolizumab held after 3 cycles due to fall and right fracture, s/p right hemiarthroplasty.   2. Social/family history: - Lives at home with his wife.  Uses cane occasionally after he had stroke in January 2022.  He has retired after working in [employer].  He is current active smoker, 1 pack/day for 60 years.  No exposure to chemicals. - Brother died of metastatic cancer.  Maternal uncle had lung cancer.    Plan: 1. 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  No lymphadenopathy. - Last Rande Lawman was on 02/13/2023. - He has recurrent falls and recent MRI on 03/07/2023 showed no metastatic disease but subcentimeter early subacute infarct in the left frontal white matter. - Because of poor tolerance, we or discontinuing Keytruda at this time. - He is scheduled for MRI of the cervical and lumbar spine soon to look into his gait. - Reviewed labs from 04/03/2023: Normal LFTs.  CBC grossly normal.  TSH is normal on 03/06/2023. - Recommend follow-up in 2 months with CT chest without contrast and routine labs including TSH.   2.  Normocytic anemia: - Hemoglobin today is 12.2.  B12 was normal on 03/11/2023.   3.  Hypertension/A-fib: - Continue metoprolol 25 mg daily.  Blood pressure is 119/75.  Continue Eliquis twice daily.   4.  Depression: - He will continue Lexapro 10 mg daily.    No orders of the defined types were placed in this encounter.    I,Helena R Teague,acting as  a scribe for [name], MD.,have documented all relevant documentation on the behalf of [name], MD,as directed by  [name], MD while in the presence of [name], MD.  I, [name] MD, have reviewed  the above documentation for accuracy and completeness, and I agree with the above.     Darryl Ward   3/13/20259:35 AM  CHIEF COMPLAINT:   Diagnosis: right upper lobe lung adenocarcinoma    Cancer Staging  Primary adenocarcinoma of upper lobe of right lung [facility]) Staging form: Lung, AJCC 8th Edition - Clinical stage from 06/26/2021: cT1, cN1, cM0 - Unsigned    Prior Therapy: 1. 3 cycles Neoadjuvant chemoimmunotherapy with carboplatin, pemetrexed from 08/06/21 through 10/02/21 2. RUL lobectomy and LND on 10/23/203  Current Therapy:  Adjuvant pembrolizumab    HISTORY OF PRESENT ILLNESS:   Oncology History  Primary adenocarcinoma of upper lobe of right lung (HCC)  06/26/2021 Initial Diagnosis   Primary adenocarcinoma of upper lobe of right lung (HCC)   08/06/2021 - 08/27/2021 Chemotherapy   Patient is on Treatment Plan : LUNG NSCLC Pemetrexed + Carboplatin q21d x 4 Cycles     08/06/2021 - 10/02/2021 Chemotherapy   Patient is on Treatment Plan : LUNG NSCLC Pemetrexed + Carboplatin q21d x 4 Cycles     03/11/2022 -  Chemotherapy   Patient is on Treatment Plan : LUNG NSCLC Pembrolizumab (200) q21d        INTERVAL HISTORY:   Darryl Ward is a 78 y.o. male seen for follow-up of right upper lobe lung adenocarcinoma. He was last seen by me on 02/13/23.  Since his last visit, he presented to the ED on 02/26/23 after a fall and was found to have a mildly displaced fracture of the lateral left ninth and possibly 8th rib. He was given Percocet for severe pain and discharged.   Darryl Ward underwent MRI brain on 03/07/23 that found: Motion degraded examination. Subcentimeter early subacute infarct in the left frontal white matter. No evidence of intracranial metastatic disease. Unchanged right sphenoid wing meningioma. 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  Darryl Ward was admitted to the hospital from 03/11/23 to 03/14/23 for an acute ischemic CVA with dizziness and recurrent falls.  He will continue Eliquis prescription. Darryl Ward was also noted to have right knee edema and is scheduled to have rehab.   Today, he states that he is doing well overall. His appetite level is at 100%. His energy level is at 50%. He is accompanied by his wife.   Darryl Ward notes multiple recent falls his wife attributes to weakness. He notes diarrhea for 3 days while at the nursing home that has since resolved. He is planned to be discharged from the nursing home on 04/09/23.  Darryl Ward recently saw a neurologist s/p CVA, who believes gait issues are attributable to pressure on the mid and upper spine. He has MRI for the lumbar and cervical spine sometime next week.   PAST MEDICAL HISTORY:   Past Medical History: Past Medical History:  Diagnosis Date   AAA (abdominal aortic aneurysm) (HCC)    Arthritis    Cancer (HCC)    Carotid artery disease (HCC)    Nonobstructive   Cataract    COPD (chronic obstructive pulmonary disease) (HCC)    Coronary atherosclerosis of native coronary artery    PTCA small diagonal 2007 otherwise nonobstructive CAD   Depression    Dysrhythmia    Essential hypertension, benign    Hyperlipidemia    NSTEMI (non-ST elevated  myocardial infarction) (HCC) 2007   Stroke [facility]) 2004   TIA (transient ischemic attack) 2006    Surgical History: Past Surgical History:  Procedure Laterality Date   AORTA - BILATERAL FEMORAL ARTERY BYPASS GRAFT  01/08/2012   Procedure: AORTA BIFEMORAL BYPASS GRAFT;  Surgeon: Sherren Kerns, MD;  Location: [facility] OR;  Service: Vascular;  Laterality: Bilateral;  using 18x62mm x 40cm Hemashield Gold Vascular Graft with Endarterectomy, Thombectomy and  Reimplantation of Inferior Mesenteric Artery   BACK SURGERY  2021   BRONCHIAL BIOPSY  06/11/2021   Procedure: BRONCHIAL BIOPSIES;  Surgeon: Leslye Peer, MD;  Location: [facility] ENDOSCOPY;  Service: Pulmonary;;   BRONCHIAL BRUSHINGS  06/11/2021   Procedure: BRONCHIAL BRUSHINGS;  Surgeon: Leslye Peer, MD;   Location: [facility] ENDOSCOPY;  Service: Pulmonary;;   BRONCHIAL NEEDLE ASPIRATION BIOPSY  06/11/2021   Procedure: BRONCHIAL NEEDLE ASPIRATION BIOPSIES;  Surgeon: Leslye Peer, MD;  Location: [facility] ENDOSCOPY;  Service: Pulmonary;;   CARDIOVERSION N/A 09/20/2021   Procedure: CARDIOVERSION;  Surgeon: [name], MD;  Location: [facility] ORS;  Service: Cardiovascular;  Laterality: N/A;   COLONOSCOPY N/A 04/14/2019   Procedure: COLONOSCOPY;  Surgeon: Corbin Ade, MD;  Location: [facility] ENDO SUITE;  Service: Endoscopy;  Laterality: N/A;  9:30   COLONOSCOPY WITH PROPOFOL N/A 07/25/2021   Procedure: COLONOSCOPY WITH PROPOFOL;  Surgeon: Lanelle Bal, DO;  Location: [facility] ENDO SUITE;  Service: Endoscopy;  Laterality: N/A;  1:00pm   FIDUCIAL MARKER PLACEMENT  06/11/2021   Procedure: FIDUCIAL MARKER PLACEMENT;  Surgeon: Leslye Peer, MD;  Location: [facility] ENDOSCOPY;  Service: Pulmonary;;   HIP ARTHROPLASTY Right 04/28/2022   Procedure: ARTHROPLASTY BIPOLAR HIP (HEMIARTHROPLASTY);  Surgeon: Sheral Apley, MD;  Location: [facility] OR;  Service: Orthopedics;  Laterality: Right;   INTERCOSTAL NERVE BLOCK Right 11/12/2021   Procedure: INTERCOSTAL NERVE BLOCK;  Surgeon: Loreli Slot, MD;  Location: [facility] OR;  Service: Thoracic;  Laterality: Right;   IR IMAGING GUIDED PORT INSERTION  07/31/2021   Left cataract surgery     LYMPH NODE DISSECTION Right 11/12/2021   Procedure: LYMPH NODE DISSECTION;  Surgeon: Loreli Slot, MD;  Location: [facility] OR;  Service: Thoracic;  Laterality: Right;   POLYPECTOMY  04/14/2019   Procedure: POLYPECTOMY;  Surgeon: Corbin Ade, MD;  Location[facility] ENDO SUITE;  Service: Endoscopy;;   POLYPECTOMY  07/25/2021   Procedure: POLYPECTOMY;  Surgeon: Lanelle Bal, DO;  Location: [facility] ENDO SUITE;  Service: Endoscopy;;   TEE WITHOUT CARDIOVERSION N/A 09/20/2021   Procedure: TRANSESOPHAGEAL ECHOCARDIOGRAM (TEE);  Surgeon: [name], MD;  Location: [facility] ORS;  Service:  Cardiovascular;  Laterality: N/A;   TRANSFORAMINAL LUMBAR INTERBODY FUSION (TLIF) WITH PEDICLE SCREW FIXATION 1 LEVEL N/A 04/27/2020   Procedure: Transforaminal Lumbar Interbody Fusion Lumbar Five-Sacral One;  Surgeon: Bedelia Person, MD;  Location: [facility] OR;  Service: Neurosurgery;  Laterality: N/A;   VIDEO BRONCHOSCOPY WITH INSERTION OF INTERBRONCHIAL VALVE (IBV) N/A 11/22/2021   Procedure: VIDEO BRONCHOSCOPY WITH INSERTION OF INTERBRONCHIAL VALVE (IBV);  Surgeon: Loreli Slot, MD;  Location: [facility] OR;  Service: Thoracic;  Laterality: N/A;   VIDEO BRONCHOSCOPY WITH INSERTION OF INTERBRONCHIAL VALVE (IBV) N/A 01/24/2022   Procedure: VIDEO BRONCHOSCOPY WITH REMOVAL OF INTERBRONCHIAL VALVE (IBV);  Surgeon: Loreli Slot, MD;  Location: [facility] OR;  Service: Thoracic;  Laterality: N/A;   VIDEO BRONCHOSCOPY WITH RADIAL ENDOBRONCHIAL ULTRASOUND  06/11/2021   Procedure: VIDEO BRONCHOSCOPY WITH RADIAL ENDOBRONCHIAL ULTRASOUND;  Surgeon: Leslye Peer, MD;  Location:  [facility] ENDOSCOPY;  Service: Pulmonary;;    Social History: Social History   Socioeconomic History   Marital status: Divorced    Spouse name: Not on file   Number of children: 1   Years of education: 11   Highest education level: 11th grade  Occupational History    Employer: Engineer, materials  Tobacco Use   Smoking status: Former    Current packs/day: 0.00    Average packs/day: 1 pack/day for 40.0 years (40.0 ttl pk-yrs)    Types: Cigarettes    Start date: 07/1981    Quit date: 07/2021    Years since quitting: 1.7   Smokeless tobacco: Never   Tobacco comments:    1 pack of cigarettes smoked daily. 07/17/21 ARJ, RN   Vaping Use   Vaping status: Never Used  Substance and Sexual Activity   Alcohol use: No    Comment: Prior history of regular alcohol use   Drug use: No   Sexual activity: Not Currently  Other Topics Concern   Not on file  Social History Narrative   Not on file   Social Drivers of Health   Financial  Resource Strain: High Risk (08/30/2022)   Overall Financial Resource Strain (CARDIA)    Difficulty of Paying Living Expenses: Hard  Food Insecurity: No Food Insecurity (03/10/2023)   Hunger Vital Sign    Worried About Running Out of Food in the Last Year: Never true    Ran Out of Food in the Last Year: Never true  Transportation Needs: No Transportation Needs (03/10/2023)   PRAPARE - Administrator, Civil Service (Medical): No    Lack of Transportation (Non-Medical): No  Physical Activity: Insufficiently Active (08/30/2022)   Exercise Vital Sign    Days of Exercise per Week: 7 days    Minutes of Exercise per Session: 20 min  Stress: No Stress Concern Present (08/30/2022)   [employer] of Occupational Health - Occupational Stress Questionnaire    Feeling of Stress : Not at all  Social Connections: Socially Isolated (03/10/2023)   Social Connection and Isolation Panel [NHANES]    Frequency of Communication with Friends and Family: Twice a week    Frequency of Social Gatherings with Friends and Family: Twice a week    Attends Religious Services: Never    Database administrator or Organizations: No    Attends Banker Meetings: Never    Marital Status: Divorced  Catering manager Violence: Not At Risk (03/10/2023)   Humiliation, Afraid, Rape, and Kick questionnaire    Fear of Current or Ex-Partner: No    Emotionally Abused: No    Physically Abused: No    Sexually Abused: No    Family History: Family History  Problem Relation Age of Onset   Hyperlipidemia Sister    Heart attack Brother 85   Cancer - Colon Neg Hx     Current Medications:  Current Outpatient Medications:    albuterol (VENTOLIN HFA) 108 (90 Base) MCG/ACT inhaler, Inhale 2 puffs into the lungs every 6 (six) hours as needed for wheezing or shortness of breath., Disp: 6.7 g, Rfl: 6   ALPRAZolam (XANAX) 0.5 MG tablet, Take 1-2 tablets 30 minutes prior to MRI, may repeat once as needed. Must have  driver., Disp: 3 tablet, Rfl: 0   apixaban (ELIQUIS) 5 MG TABS tablet, Take 1 tablet (5 mg total) by mouth 2 (two) times daily., Disp: 180 tablet, Rfl: 3   atorvastatin (LIPITOR) 80 MG tablet, Take 1  tablet (80 mg total) by mouth daily., Disp: 90 tablet, Rfl: 3   chlorthalidone (HYGROTON) 25 MG tablet, Take 1 tablet (25 mg total) by mouth daily., Disp: 30 tablet, Rfl: 1   Cholecalciferol 50 MCG (2000 UT) CAPS, Take 1 capsule by mouth daily., Disp: , Rfl:    cyclobenzaprine (FLEXERIL) 10 MG tablet, Take 1 tablet (10 mg total) by mouth 2 (two) times daily as needed., Disp: 10 tablet, Rfl: 0   escitalopram (LEXAPRO) 10 MG tablet, Take 1 tablet (10 mg total) by mouth daily., Disp: 90 tablet, Rfl: 3   folic acid (KP FOLIC ACID) 1 MG tablet, Take 1 tablet (1 mg total) by mouth daily., Disp: 90 tablet, Rfl: 3   furosemide (LASIX) 20 MG tablet, Take 1 tablet (20 mg total) by mouth daily as needed., Disp: 90 tablet, Rfl: 2   magnesium oxide (MAG-OX) 400 (240 Mg) MG tablet, Take 1 tablet (400 mg total) by mouth daily., Disp: 120 tablet, Rfl: 3   meclizine (ANTIVERT) 25 MG tablet, Take 1 tablet (25 mg total) by mouth 3 (three) times daily as needed for dizziness., Disp: 30 tablet, Rfl: 0   metoprolol succinate (TOPROL-XL) 25 MG 24 hr tablet, Take 1 tablet (25 mg total) by mouth daily., Disp: 90 tablet, Rfl: 3   pantoprazole (PROTONIX) 40 MG tablet, Take 1 tablet (40 mg total) by mouth daily., Disp: 90 tablet, Rfl: 3   tamsulosin (FLOMAX) 0.4 MG CAPS capsule, Take 1 capsule (0.4 mg total) by mouth daily., Disp: 90 capsule, Rfl: 3   umeclidinium bromide (INCRUSE ELLIPTA) 62.5 MCG/ACT AEPB, Inhale 1 puff into the lungs daily., Disp: , Rfl:  No current facility-administered medications for this visit.  Facility-Administered Medications Ordered in Other Visits:    sodium chloride flush (NS) 0.9 % injection 10 mL, 10 mL, Intracatheter, PRN, [name], MD, 10 mL at 07/10/22 1513   Allergies: No Known  Allergies  REVIEW OF SYSTEMS:   Review of Systems  Constitutional:  Negative for chills, fatigue and fever.  HENT:   Negative for lump/mass, mouth sores, nosebleeds, sore throat and trouble swallowing.   Eyes:  Negative for eye problems.  Respiratory:  Negative for cough and shortness of breath.   Cardiovascular:  Negative for chest pain, leg swelling and palpitations.  Gastrointestinal:  Negative for abdominal pain, constipation, diarrhea, nausea and vomiting.  Genitourinary:  Negative for bladder incontinence, difficulty urinating, dysuria, frequency, hematuria and nocturia.   Musculoskeletal:  Negative for arthralgias, back pain, flank pain, myalgias and neck pain.  Skin:  Negative for itching and rash.  Neurological:  Negative for dizziness, headaches and numbness.  Hematological:  Does not bruise/bleed easily.  Psychiatric/Behavioral:  Negative for depression, sleep disturbance and suicidal ideas. The patient is not nervous/anxious.   All other systems reviewed and are negative.    VITALS:   There were no vitals taken for this visit.  Wt Readings from Last 3 Encounters:  03/31/23 185 lb 10 oz (84.2 kg)  03/10/23 185 lb 10 oz (84.2 kg)  03/06/23 185 lb 10 oz (84.2 kg)    There is no height or weight on file to calculate BMI.  Performance status (ECOG): 1 - Symptomatic but completely ambulatory  PHYSICAL EXAM:   Physical Exam Vitals and nursing note reviewed. Exam conducted with a chaperone present.  Constitutional:      Appearance: Normal appearance.  Cardiovascular:     Rate and Rhythm: Normal rate and regular rhythm.     Pulses: Normal pulses.  Heart sounds: Normal heart sounds.  Pulmonary:     Effort: Pulmonary effort is normal.     Breath sounds: Normal breath sounds.  Abdominal:     Palpations: Abdomen is soft. There is no hepatomegaly, splenomegaly or mass.     Tenderness: There is no abdominal tenderness.  Musculoskeletal:     Right lower leg: No edema.      Left lower leg: No edema.  Lymphadenopathy:     Cervical: No cervical adenopathy.     Right cervical: No superficial, deep or posterior cervical adenopathy.    Left cervical: No superficial, deep or posterior cervical adenopathy.     Upper Body:     Right upper body: No supraclavicular or axillary adenopathy.     Left upper body: No supraclavicular or axillary adenopathy.  Neurological:     General: No focal deficit present.     Mental Status: He is alert and oriented to person, place, and time.  Psychiatric:        Mood and Affect: Mood normal.        Behavior: Behavior normal.     LABS:      Latest Ref Rng & Units 03/10/2023    2:23 PM 03/06/2023   12:48 PM 02/26/2023    4:11 PM  CBC  WBC 4.0 - 10.5 K/uL 7.3  7.4  9.0   Hemoglobin 13.0 - 17.0 g/dL 16.1  09.6  04.5   Hematocrit 39.0 - 52.0 % 36.8  34.7  41.3   Platelets 150 - 400 K/uL 163  159  127       Latest Ref Rng & Units 03/10/2023    5:11 PM 03/10/2023    2:23 PM 03/06/2023   12:48 PM  CMP  Glucose 70 - 99 mg/dL  99  409   BUN 8 - 23 mg/dL  19  23   Creatinine 8.11 - 1.24 mg/dL  9.14  7.82   Sodium 956 - 145 mmol/L  142  138   Potassium 3.5 - 5.1 mmol/L 4.3  5.2  4.0   Chloride 98 - 111 mmol/L  106  105   CO2 22 - 32 mmol/L  26  24   Calcium 8.9 - 10.3 mg/dL  9.2  9.0   Total Protein 6.5 - 8.1 g/dL  6.9  7.1   Total Bilirubin 0.0 - 1.2 mg/dL  0.7  0.7   Alkaline Phos 38 - 126 U/L  77  71   AST 15 - 41 U/L  19  20   ALT 0 - 44 U/L  7  12      No results found for: "CEA1", "CEA" / No results found for: "CEA1", "CEA" Lab Results  Component Value Date   PSA1 1.2 11/25/2019   No results found for: "OZH086" No results found for: "CAN125"  No results found for: "TOTALPROTELP", "ALBUMINELP", "A1GS", "A2GS", "BETS", "BETA2SER", "GAMS", "MSPIKE", "SPEI" Lab Results  Component Value Date   TIBC 233 (L) 08/21/2022   TIBC 253 06/13/2022   TIBC 291 04/01/2022   FERRITIN 372 (H) 08/21/2022   FERRITIN 501 (H)  06/13/2022   FERRITIN 86 04/01/2022   IRONPCTSAT 37 08/21/2022   IRONPCTSAT 24 06/13/2022   IRONPCTSAT 22 04/01/2022   No results found for: "LDH"   STUDIES:   CT ANGIO HEAD NECK W WO CM Result Date: 03/10/2023 CLINICAL DATA:  Neuro deficit, acute, stroke suspected. Weakness. Falling. Anticoagulated. EXAM: CT ANGIOGRAPHY HEAD AND NECK WITH AND WITHOUT CONTRAST TECHNIQUE:  Multidetector CT imaging of the head and neck was performed using the standard protocol during bolus administration of intravenous contrast. Multiplanar CT image reconstructions and MIPs were obtained to evaluate the vascular anatomy. Carotid stenosis measurements (when applicable) are obtained utilizing NASCET criteria, using the distal internal carotid diameter as the denominator. RADIATION DOSE REDUCTION: This exam was performed according to the departmental dose-optimization program which includes automated exposure control, adjustment of the mA and/or kV according to patient size and/or use of iterative reconstruction technique. CONTRAST:  75mL OMNIPAQUE IOHEXOL 350 MG/ML SOLN COMPARISON:  MRI 03/07/2023 FINDINGS: CT HEAD FINDINGS Brain: No focal abnormality seen affecting the brainstem or cerebellum. Old infarction in the right basal ganglia. Old infarction or cyst in the left basal ganglia. Chronic small-vessel ischemic changes affect the white matter. No acute stroke visible by CT. No mass, hemorrhage, hydrocephalus or extra-axial collection. Chronic right sphenoid wing meningioma appears unchanged from prior exams. Vascular: There is atherosclerotic calcification of the major vessels at the base of the brain. Skull: Negative Sinuses/Orbits: Clear/normal Other: None Review of the MIP images confirms the above findings CTA NECK FINDINGS Aortic arch: Aortic atherosclerosis.  Branching pattern is normal. Right carotid system: Common carotid artery widely patent to the bifurcation. Calcified plaque at the carotid bifurcation and ICA  bulb but no stenosis. Cervical ICA widely patent beyond that. Left carotid system: Common carotid artery widely patent to the bifurcation. Calcified plaque at the carotid bifurcation and ICA bulb. Minimal diameter in the ICA bulb is 2.75 mm. Compared to a more distal cervical ICA diameter of 5 mm, this indicates a 45% stenosis. Vertebral arteries: Dominant right vertebral artery shows calcified plaque at its origin with stenosis 30%. Beyond that, the vessel is widely patent through the cervical region to the foramen magnum. 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 Skeleton: Mild cervical spondylosis. Other neck: No mass or lymphadenopathy. Upper chest: Chronic pleural and parenchymal scarring on the right. Review of the MIP images confirms the above findings CTA HEAD FINDINGS Anterior circulation: Both internal carotid arteries are patent through the skull base and siphon regions. There is siphon atherosclerotic calcification with stenosis estimated at 30-50% on both sides. On the right, the internal carotid artery supplies the middle cerebral artery. The A1 segment is aplastic. On the left, the carotid artery supplies the left middle cerebral artery territory and both anterior cerebral artery territories. No large vessel occlusion or proximal stenosis. Posterior circulation: Both vertebral arteries are patent through the foramen magnum to the basilar artery. Atherosclerotic plaque of the right vertebral artery V4 segment with stenosis estimated at 30%. No basilar stenosis. Superior cerebellar arteries and posterior cerebral arteries are patent. Venous sinuses: Patent and normal Anatomic variants: None other significant. Review of the MIP images confirms the above findings IMPRESSION: 1. No acute head CT finding. Old infarction in the right basal ganglia. Old  infarction or cyst in the left basal ganglia. Chronic small-vessel ischemic changes of the white matter. 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 2. Aortic atherosclerosis. 3. Atherosclerotic disease at both carotid bifurcations but without stenosis on the right. 45% stenosis proximal left ICA. 4. Atherosclerotic disease at both vertebral artery origins. The left vertebral artery is occluded at its origin but shows some reconstituted flow in the mid upper cervical region due to cervical collaterals, and is  patent to the foramen magnum due to that. This is a chronic finding. 5. Atherosclerotic disease in both carotid siphon regions with stenosis estimated at 30-50% on both sides. 6. Atherosclerotic disease in the right vertebral artery V4 segment with stenosis estimated at 30%. Electronically Signed   By: Paulina Fusi M.D.   On: 03/10/2023 18:51   DG Chest Portable 1 View Result Date: 03/10/2023 CLINICAL DATA:  Weakness and frequent falls with new finding of left frontal infarction. EXAM: PORTABLE CHEST 1 VIEW COMPARISON:  Chest radiograph dated 02/26/2023 FINDINGS: Lines/tubes: Left chest wall port tip projects over the superior cavoatrial junction. Lungs: Well inflated lungs. Unchanged elevation of the right hemidiaphragm with unchanged right apical density. Postsurgical changes of the right upper lung. Unchanged left lower lung cyst. No new focal consolidations. Pleura: No pneumothorax or pleural effusion. Heart/mediastinum: The heart size and mediastinal contours are within normal limits. Bones: No acute osseous abnormality. IMPRESSION: 1. No acute disease. 2. Unchanged elevation of the right hemidiaphragm with unchanged right apical density. Electronically Signed   By: Agustin Cree M.D.   On: 03/10/2023 16:26   MR Brain W Wo Contrast Result Date: 03/07/2023 CLINICAL DATA:  Dizziness, imbalance, and falls. History of non-small cell lung cancer. EXAM: MRI HEAD WITHOUT AND WITH  CONTRAST TECHNIQUE: Multiplanar, multiecho pulse sequences of the brain and surrounding structures were obtained without and with intravenous contrast. CONTRAST:  7.66mL GADAVIST GADOBUTROL 1 MMOL/ML IV SOLN COMPARISON:  Head CT 02/26/2023 and MRI 05/02/2022 FINDINGS: The study is moderately motion degraded. Brain: There is a 7 mm focus of restricted diffusion and T2 hyperintensity in the subcortical white matter of the left superior frontal gyrus, likely with corresponding hypodensity in this location on the prior CT and most consistent with an early subacute infarct. No acute large territory infarct, intracranial hemorrhage, midline shift, or extra-axial fluid collection is identified. Patchy T2 hyperintensities elsewhere in the cerebral white matter bilaterally are similar to the prior MRI and are nonspecific but compatible with chronic small vessel ischemic disease. Chronic lacunar infarcts are again noted in the deep gray nuclei and cerebral white matter bilaterally. There is moderate cerebral atrophy. A 2.5 x 1.6 cm meningioma along the right sphenoid wing has not significantly changed in size from a 07/11/2021 MRI. No other enhancing intracranial lesion is identified within limitations of motion artifact. Vascular: Major intracranial vascular flow voids are preserved. Skull and upper cervical spine: Unremarkable bone marrow signal. Sinuses/Orbits: Bilateral cataract extraction. Clear paranasal sinuses. Moderate bilateral mastoid effusions. Other: None. These results will be called to the ordering clinician or representative by the Radiologist Assistant, and communication documented in the PACS or [employer]. IMPRESSION: 1. Motion degraded examination. 2. Subcentimeter early subacute infarct in the left frontal white matter. 3. No evidence of intracranial metastatic disease. 4. Unchanged right sphenoid wing meningioma. 5. 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 Electronically  Signed   By: [name] Ache M.D.   On: 03/07/2023 15:40

## 2023-04-03 NOTE — Patient Instructions (Addendum)
 [facility] at [facility] Discharge Instructions   You were seen and examined today by Dr. Ellin Saba.  He reviewed the results of your lab work which are normal/stable.   We will discontinue your treatment.   We will see you back in 2 months. We will repeat a CT scan at that time.   Return as scheduled.    Thank you for choosing [facility] at [facility] to provide your oncology and hematology care.  To afford each patient quality time with our provider, please arrive at least 15 minutes before your scheduled appointment time.   If you have a lab appointment with the Cancer Center please come in thru the Main Entrance and check in at the main information desk.  You need to re-schedule your appointment should you arrive 10 or more minutes late.  We strive to give you quality time with our providers, and arriving late affects you and other patients whose appointments are after yours.  Also, if you no show three or more times for appointments you may be dismissed from the clinic at the providers discretion.     Again, thank you for choosing [facility].  Our hope is that these requests will decrease the amount of time that you wait before being seen by our physicians.       _____________________________________________________________  Should you have questions after your visit to [facility], please contact our office at [phone] and follow the prompts.  Our office hours are 8:00 a.m. and 4:30 p.m. Monday - Friday.  Please note that voicemails left after 4:00 p.m. may not be returned until the following business day.  We are closed weekends and major holidays.  You do have access to a nurse 24-7, just call the main number to the clinic [phone] and do not press any options, hold on the line and a nurse will answer the phone.    For prescription refill requests, have your pharmacy contact our office and allow 72  hours.    Due to Covid, you will need to wear a mask upon entering the hospital. If you do not have a mask, a mask will be given to you at the Main Entrance upon arrival. For doctor visits, patients may have 1 support person age 40 or older with them. For treatment visits, patients can not have anyone with them due to social distancing guidelines and our immunocompromised population.

## 2023-04-16 NOTE — Progress Notes (Unsigned)
 Cardiology Office Note    Date:  04/17/2023  ID:  Darryl Ward, DOB Feb 21, 1945, MRN [id number] Cardiologist: Nona Dell, MD    History of Present Illness:    Darryl Ward is a 78 y.o. 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  He was last examined by Dr. Diona Browner in 10/2022 and was overall feeling well at that time. Denied any recent chest pain or palpitations and no changes were made to his cardiac medications at that time.  In the interim, he was hospitalized in 02/2023 for evaluation of recurrent falls with associated dizziness. Was found to have a subacute left frontal lobe ischemic CVA. He did not receive tPA as he was outside the window for this. It was felt that his frequent falls were likely due to deconditioning in the setting of prior cancer treatment and stroke was an incidental finding. Was continued on Eliquis for atrial fibrillation and discharged to SNF for rehabilitation.  In talking with the patient and his significant other today, he reports having returned home from SNF just last week. They are waiting to get home health services arranged. He is mostly in a wheelchair throughout the day and uses a walker for short distances. Reports generalized fatigue. No specific dyspnea on exertion or chest pain. No recent palpitations, orthopnea, PND or pitting edema. Remains on Eliquis for anticoagulation with no reports of active bleeding.  Studies Reviewed:   EKG: EKG is not ordered today.  Echocardiogram: 02/2022 IMPRESSIONS     1. Left ventricular ejection fraction, by estimation, is approximately  55%. The left ventricle has normal function. The left ventricle   demonstrates regional wall motion abnormalities (see scoring  diagram/findings for description). Left ventricular  diastolic parameters are consistent with Grade II diastolic dysfunction  (pseudonormalization).   2. Right ventricular systolic function is normal. The right ventricular  size is normal. There is normal pulmonary artery systolic pressure. The  estimated right ventricular systolic pressure is 35.0 mmHg.   3. Left atrial size was mildly dilated.   4. The mitral valve is grossly normal. Mild mitral valve regurgitation.   5. The aortic valve is tricuspid. Aortic valve regurgitation is not  visualized.   6. Aortic dilatation noted. 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   7. The inferior vena cava is dilated in size with >50% respiratory  variability, suggesting right atrial pressure of 8 mmHg.   Comparison(s): Prior images reviewed side by side. LVEF has improved in  comparison.   Risk Assessment/Calculations:    CHA2DS2-VASc Score = 6   This indicates a 9.7% annual risk of stroke. The patient's score is based upon: CHF History: 0 HTN History: 1 Diabetes History: 0 Stroke History: 2 Vascular Disease History: 1 Age Score: 2 Gender Score: 0     Physical Exam:   VS:  BP 114/68   Pulse 68   Ht 5\' 10"  (1.778 m)   Wt 172 lb 3.2 oz (78.1 kg)   SpO2 98%   BMI 24.71 kg/m    Wt Readings from Last 3 Encounters:  04/17/23 172 lb 3.2 oz (78.1 kg)  04/03/23 171 lb 11.8 oz (77.9 kg)  03/31/23  185 lb 10 oz (84.2 kg)     GEN: Pleasant male appearing in no acute distress. Sitting in wheelchair.  NECK: No JVD; No carotid bruits CARDIAC: RRR, no murmurs, rubs, gallops RESPIRATORY:  Clear to auscultation without rales, wheezing or rhonchi  ABDOMEN: Appears non-distended. No obvious abdominal masses. EXTREMITIES: No clubbing or cyanosis. No pitting edema.  Distal pedal pulses are 2+ bilaterally.   Assessment and Plan:   1. CAD - He has a  history of PTCA to the D1 in 2007 and most recent ischemic evaluation was a low-risk NST in 2013. Echo in 02/2022 showed a preserved EF of 55%. - While he does have fatigue in the setting of his recent stroke and also undergoing prior cancer treatment, he denies any progressive dyspnea or associated exertional chest pain. - Continue current medical therapy with Atorvastatin 80 mg daily and Toprol-XL 25 mg daily. He is no longer on ASA given the need for anticoagulation.  2. Chronic HFimpEF - His ejection fraction was previously as low as 30 to 35% and has normalized in the interim. At 55% by echocardiogram in 02/2022. Recent labs on 04/03/2023 showed his K+ was low at 3.1 and he does not believe he received extra potassium supplementation while at SNF. Will recheck a repeat BMET as he has been on both Chlorthalidone 25 mg daily and Lasix 20 mg as needed.   3. Paroxysmal Atrial Fibrillation - He denies any recent palpitations and is in normal sinus rhythm by examination today. Remains on Toprol-XL 25 mg daily for rate-control. - No reports of active bleeding. He is on Eliquis 5 mg twice daily for anticoagulation which is the appropriate dose at this time given his age, weight and renal function.  4. HTN  -Blood pressure is well-controlled at 114/68 during today's visit. Continue current medical therapy with Chlorthalidone 25 mg daily and Toprol-XL 25 mg daily.  5. HLD - LDL was at 37 when checked in 02/2023. Continue current medical therapy with Atorvastatin 80 mg daily.  6. Right Upper Lobe Adenocarcinoma  - Followed by Dr. Ellin Saba and s/p RUL lobectomy and chemo. Previously on Keytruda but discontinued earlier this month by review of notes due to intolerances to the medication.  Signed, Ellsworth Lennox, PA-C

## 2023-04-17 ENCOUNTER — Inpatient Hospital Stay: Payer: HMO

## 2023-04-17 ENCOUNTER — Encounter: Payer: Self-pay | Admitting: Student

## 2023-04-17 ENCOUNTER — Inpatient Hospital Stay: Payer: HMO | Admitting: Hematology

## 2023-04-17 ENCOUNTER — Ambulatory Visit: Payer: HMO | Attending: Student | Admitting: Student

## 2023-04-17 VITALS — BP 114/68 | HR 68 | Ht 70.0 in | Wt 172.2 lb

## 2023-04-17 DIAGNOSIS — E876 Hypokalemia: Secondary | ICD-10-CM | POA: Diagnosis not present

## 2023-04-17 DIAGNOSIS — I2510 Atherosclerotic heart disease of native coronary artery without angina pectoris: Secondary | ICD-10-CM

## 2023-04-17 DIAGNOSIS — I480 Paroxysmal atrial fibrillation: Secondary | ICD-10-CM

## 2023-04-17 DIAGNOSIS — I10 Essential (primary) hypertension: Secondary | ICD-10-CM

## 2023-04-17 DIAGNOSIS — C3491 Malignant neoplasm of unspecified part of right bronchus or lung: Secondary | ICD-10-CM

## 2023-04-17 DIAGNOSIS — E785 Hyperlipidemia, unspecified: Secondary | ICD-10-CM

## 2023-04-17 DIAGNOSIS — I5032 Chronic diastolic (congestive) heart failure: Secondary | ICD-10-CM | POA: Diagnosis not present

## 2023-04-17 NOTE — Patient Instructions (Signed)
 Medication Instructions:  Your physician recommends that you continue on your current medications as directed. Please refer to the Current Medication list given to you today.  *If you need a refill on your cardiac medications before your next appointment, please call your pharmacy*   Lab Work: BMET- [employer] If you have labs (blood work) drawn today and your tests are completely normal, you will receive your results only by: [employer] (if you have MyChart) OR A paper copy in the mail If you have any lab test that is abnormal or we need to change your treatment, we will call you to review the results.   Testing/Procedures: None   Follow-Up: At [facility], you and your health needs are our priority.  As part of our continuing mission to provide you with exceptional heart care, we have created designated Provider Care Teams.  These Care Teams include your primary Cardiologist (physician) and Advanced Practice Providers (APPs -  Physician Assistants and Nurse Practitioners) who all work together to provide you with the care you need, when you need it.  We recommend signing up for the patient portal called "MyChart".  Sign up information is provided on this After Visit Summary.  MyChart is used to connect with patients for Virtual Visits (Telemedicine).  Patients are able to view lab/test results, encounter notes, upcoming appointments, etc.  Non-urgent messages can be sent to your provider as well.   To learn more about what you can do with MyChart, go to [email or web address].    Your next appointment:   6 month(s)  Provider:   You may see Nona Dell, MD or one of the following Advanced Practice Providers on your designated Care Team:   Randall An, PA-C  Jacolyn Reedy, [place]     Other Instructions

## 2023-04-21 ENCOUNTER — Other Ambulatory Visit (HOSPITAL_COMMUNITY): Payer: Self-pay

## 2023-04-21 ENCOUNTER — Other Ambulatory Visit: Payer: Self-pay | Admitting: Family Medicine

## 2023-04-21 ENCOUNTER — Other Ambulatory Visit: Payer: Self-pay

## 2023-04-21 MED ORDER — FUROSEMIDE 20 MG PO TABS
20.0000 mg | ORAL_TABLET | Freq: Every day | ORAL | 1 refills | Status: DC | PRN
  Filled 2023-04-21: qty 90, 90d supply, fill #0
  Filled 2023-09-16: qty 90, 90d supply, fill #1

## 2023-04-24 ENCOUNTER — Inpatient Hospital Stay: Payer: HMO

## 2023-04-24 ENCOUNTER — Inpatient Hospital Stay: Payer: HMO | Admitting: Hematology

## 2023-04-25 ENCOUNTER — Ambulatory Visit: Admitting: Family Medicine

## 2023-04-25 ENCOUNTER — Encounter: Payer: Self-pay | Admitting: Family Medicine

## 2023-04-25 ENCOUNTER — Other Ambulatory Visit (HOSPITAL_COMMUNITY): Payer: Self-pay

## 2023-04-25 ENCOUNTER — Encounter: Payer: Self-pay | Admitting: Internal Medicine

## 2023-04-25 VITALS — BP 106/69 | HR 63 | Temp 98.2°F | Ht 70.0 in | Wt 174.0 lb

## 2023-04-25 DIAGNOSIS — Z8673 Personal history of transient ischemic attack (TIA), and cerebral infarction without residual deficits: Secondary | ICD-10-CM | POA: Diagnosis not present

## 2023-04-25 DIAGNOSIS — S2242XS Multiple fractures of ribs, left side, sequela: Secondary | ICD-10-CM

## 2023-04-25 DIAGNOSIS — I10 Essential (primary) hypertension: Secondary | ICD-10-CM

## 2023-04-25 MED ORDER — HYDROCODONE-ACETAMINOPHEN 5-325 MG PO TABS
1.0000 | ORAL_TABLET | Freq: Three times a day (TID) | ORAL | 0 refills | Status: DC | PRN
  Filled 2023-04-25: qty 15, 5d supply, fill #0

## 2023-04-25 NOTE — Patient Instructions (Signed)
 Referral placed.  Pain medication as directed.  Follow up in 3 months.

## 2023-04-26 LAB — BASIC METABOLIC PANEL WITH GFR
BUN/Creatinine Ratio: 17 (ref 10–24)
BUN: 26 mg/dL (ref 8–27)
CO2: 25 mmol/L (ref 20–29)
Calcium: 9.1 mg/dL (ref 8.6–10.2)
Chloride: 101 mmol/L (ref 96–106)
Creatinine, Ser: 1.57 mg/dL — ABNORMAL HIGH (ref 0.76–1.27)
Glucose: 76 mg/dL (ref 70–99)
Potassium: 3.9 mmol/L (ref 3.5–5.2)
Sodium: 142 mmol/L (ref 134–144)
eGFR: 45 mL/min/{1.73_m2} — ABNORMAL LOW (ref >59)

## 2023-04-28 DIAGNOSIS — S2249XA Multiple fractures of ribs, unspecified side, initial encounter for closed fracture: Secondary | ICD-10-CM | POA: Insufficient documentation

## 2023-04-28 NOTE — Assessment & Plan Note (Signed)
 Stable.  Continue current medications.

## 2023-04-28 NOTE — Progress Notes (Signed)
 Subjective:  Patient ID: Darryl Ward, male    DOB: 03-20-1945  Age: 78 y.o. MRN: [id number]  CC:   Chief Complaint  Patient presents with   Follow-up    Rehab follow up.  Would like pain medication due to rib pain    HPI:  78 year old male with an extensive past medical history including congestive heart failure, peripheral vascular disease, coronary disease, hypertension, paroxysmal A-fib, primary adenocarcinoma of the right lung, COPD, and recent stroke presents for follow-up.  Patient recently fell prior to admission to the hospital.  Suffered fractures of the eighth and ninth ribs on the left.  He states that he still having significant pain.  Patient's significant other is with him today.  He is supposed to have home health.  This was set up but the home health agency that was chosen does not set his insurance.  She would like me to place referral again today.  BP stable on metoprolol chlorthalidone, and Lasix.  Patient Active Problem List   Diagnosis Date Noted   Rib fractures 04/28/2023   Urinary incontinence 03/31/2023   Recurrent falls 03/11/2023   Paroxysmal atrial fibrillation (HCC) 03/11/2023   GERD without esophagitis 03/11/2023   Depression 03/11/2023   Benign prostatic hyperplasia with lower urinary tract symptoms 06/13/2022   Other intervertebral disc degeneration, lumbar region 05/01/2022   Normocytic anemia 04/28/2022   Iron deficiency anemia 04/22/2022   History of stroke 03/27/2022   Chronic combined systolic and diastolic CHF (congestive heart failure) (HCC) 03/27/2022   Essential hypertension 09/30/2021   Primary adenocarcinoma of upper lobe of right lung (HCC) 06/26/2021   COPD (chronic obstructive pulmonary disease) (HCC) 05/29/2021   Lumbar spinal stenosis 02/06/2020   Meningioma, cerebral (HCC) 02/05/2020   Chronic midline low back pain without sciatica 04/27/2019   Tobacco abuse 11/17/2018   PVD (peripheral vascular disease) (HCC) 05/21/2012    Mixed hyperlipidemia 12/13/2011   Coronary atherosclerosis of native coronary artery 12/13/2011    Social Hx   Social History   Socioeconomic History   Marital status: Divorced    Spouse name: Not on file   Number of children: 1   Years of education: 11   Highest education level: 11th grade  Occupational History    Employer: Engineer, materials  Tobacco Use   Smoking status: Former    Current packs/day: 0.00    Average packs/day: 1 pack/day for 40.0 years (40.0 ttl pk-yrs)    Types: Cigarettes    Start date: 07/1981    Quit date: 07/2021    Years since quitting: 1.7   Smokeless tobacco: Never   Tobacco comments:    1 pack of cigarettes smoked daily. 07/17/21 ARJ, RN   Vaping Use   Vaping status: Never Used  Substance and Sexual Activity   Alcohol use: No    Comment: Prior history of regular alcohol use   Drug use: No   Sexual activity: Not Currently  Other Topics Concern   Not on file  Social History Narrative   Not on file   Social Drivers of Health   Financial Resource Strain: High Risk (08/30/2022)   Overall Financial Resource Strain (CARDIA)    Difficulty of Paying Living Expenses: Hard  Food Insecurity: No Food Insecurity (03/10/2023)   Hunger Vital Sign    Worried About Running Out of Food in the Last Year: Never true    Ran Out of Food in the Last Year: Never true  Transportation Needs: No Transportation Needs (03/10/2023)  PRAPARE - Administrator, Civil Service (Medical): No    Lack of Transportation (Non-Medical): No  Physical Activity: Insufficiently Active (08/30/2022)   Exercise Vital Sign    Days of Exercise per Week: 7 days    Minutes of Exercise per Session: 20 min  Stress: No Stress Concern Present (08/30/2022)   [employer] of Occupational Health - Occupational Stress Questionnaire    Feeling of Stress : Not at all  Social Connections: Socially Isolated (03/10/2023)   Social Connection and Isolation Panel [NHANES]    Frequency of  Communication with Friends and Family: Twice a week    Frequency of Social Gatherings with Friends and Family: Twice a week    Attends Religious Services: Never    Diplomatic Services operational officer: No    Attends Engineer, structural: Never    Marital Status: Divorced    Review of Systems Per HPI  Objective:  BP 106/69   Pulse 63   Temp 98.2 F (36.8 C)   Ht 5\' 10"  (1.778 m)   Wt 174 lb (78.9 kg)   SpO2 99%   BMI 24.97 kg/m      04/25/2023    8:35 AM 04/17/2023    1:57 PM 04/03/2023    1:55 PM  BP/Weight  Systolic BP 106 114 119  Diastolic BP 69 68 75  Wt. (Lbs) 174 172.2 171.74  BMI 24.97 kg/m2 24.71 kg/m2 24.64 kg/m2    Physical Exam Vitals and nursing note reviewed.  Constitutional:      General: He is not in acute distress.    Appearance: Normal appearance.  HENT:     Head: Normocephalic and atraumatic.  Cardiovascular:     Rate and Rhythm: Normal rate and regular rhythm.  Pulmonary:     Effort: Pulmonary effort is normal.     Breath sounds: Normal breath sounds. No wheezing or rales.  Musculoskeletal:     Comments: Left lower ribs with tenderness to palpation.  No crepitus.  Neurological:     Mental Status: He is alert.  Psychiatric:        Mood and Affect: Mood normal.        Behavior: Behavior normal.     Lab Results  Component Value Date   WBC 9.2 04/03/2023   HGB 12.2 (L) 04/03/2023   HCT 35.7 (L) 04/03/2023   PLT 212 04/03/2023   GLUCOSE 76 04/25/2023   CHOL 84 03/11/2023   TRIG 94 03/11/2023   HDL 28 (L) 03/11/2023   LDLCALC 37 03/11/2023   ALT 15 04/03/2023   AST 21 04/03/2023   NA 142 04/25/2023   K 3.9 04/25/2023   CL 101 04/25/2023   CREATININE 1.57 (H) 04/25/2023   BUN 26 04/25/2023   CO2 25 04/25/2023   TSH 2.997 04/03/2023   INR 1.3 (H) 04/27/2022   HGBA1C 5.5 03/10/2023     Assessment & Plan:  History of stroke Assessment & Plan: Placing referral for PT and OT.  Orders: -     Ambulatory referral to  Home Health  Essential hypertension Assessment & Plan: Stable.  Continue current medications.   Closed fracture of multiple ribs of left side, sequela Assessment & Plan: Pain medication as directed.  [place] controlled substance database reviewed.  Advised to use sparingly and to be aware of possible side effects particular sedation, dizziness.   Other orders -     HYDROcodone-Acetaminophen; Take 1 tablet by mouth every 8 (eight) hours as  needed for severe pain (pain score 7-10).  Dispense: 15 tablet; Refill: 0    Follow-up:  Return in about 3 months (around 07/25/2023).  Everlene Other DO [facility]

## 2023-04-28 NOTE — Assessment & Plan Note (Signed)
 Placing referral for PT and OT.

## 2023-04-28 NOTE — Assessment & Plan Note (Signed)
 Pain medication as directed.  [place] controlled substance database reviewed.  Advised to use sparingly and to be aware of possible side effects particular sedation, dizziness.

## 2023-05-02 ENCOUNTER — Telehealth: Payer: Self-pay | Admitting: *Deleted

## 2023-05-02 NOTE — Telephone Encounter (Signed)
 Copied from CRM [id number]. Topic: Referral - Question >> May 02, 2023  1:36 PM Albin Felling L wrote: Reason for CRM: Pt has not heard from [place] home health to start services, reached out to [facility] today and informed they had not received a referral for patient.   [name] calling to provide fax number to [facility]: [phone]  Requesting call back once referral faxed to [facility], [phone]

## 2023-05-05 ENCOUNTER — Other Ambulatory Visit: Payer: Self-pay | Admitting: *Deleted

## 2023-05-05 DIAGNOSIS — Z2989 Encounter for other specified prophylactic measures: Secondary | ICD-10-CM

## 2023-05-05 DIAGNOSIS — C3411 Malignant neoplasm of upper lobe, right bronchus or lung: Secondary | ICD-10-CM

## 2023-05-05 NOTE — Telephone Encounter (Signed)
 Per the Rep for our area Marcina Severe) - He has been accepted with [facility].

## 2023-05-13 ENCOUNTER — Other Ambulatory Visit

## 2023-05-13 ENCOUNTER — Ambulatory Visit
Admission: RE | Admit: 2023-05-13 | Discharge: 2023-05-13 | Disposition: A | Discharge status: Home / Self Care | Source: Ambulatory Visit | Attending: Neurology | Admitting: Neurology

## 2023-05-13 DIAGNOSIS — R269 Unspecified abnormalities of gait and mobility: Secondary | ICD-10-CM

## 2023-05-13 DIAGNOSIS — I639 Cerebral infarction, unspecified: Secondary | ICD-10-CM

## 2023-05-13 DIAGNOSIS — R32 Unspecified urinary incontinence: Secondary | ICD-10-CM

## 2023-05-14 ENCOUNTER — Inpatient Hospital Stay (HOSPITAL_COMMUNITY)
Admission: EM | Admit: 2023-05-14 | Discharge: 2023-05-20 | DRG: 467 | Disposition: A | Discharge status: Skilled Nursing Facility | Attending: Family Medicine | Admitting: Family Medicine

## 2023-05-14 ENCOUNTER — Emergency Department (HOSPITAL_COMMUNITY)

## 2023-05-14 ENCOUNTER — Encounter (HOSPITAL_COMMUNITY): Payer: Self-pay | Admitting: Emergency Medicine

## 2023-05-14 ENCOUNTER — Telehealth: Payer: Self-pay | Admitting: Neurology

## 2023-05-14 ENCOUNTER — Other Ambulatory Visit: Payer: Self-pay

## 2023-05-14 DIAGNOSIS — Z8249 Family history of ischemic heart disease and other diseases of the circulatory system: Secondary | ICD-10-CM

## 2023-05-14 DIAGNOSIS — Z860100 Personal history of colon polyps, unspecified: Secondary | ICD-10-CM

## 2023-05-14 DIAGNOSIS — D72829 Elevated white blood cell count, unspecified: Secondary | ICD-10-CM | POA: Diagnosis present

## 2023-05-14 DIAGNOSIS — Z7951 Long term (current) use of inhaled steroids: Secondary | ICD-10-CM

## 2023-05-14 DIAGNOSIS — S72141A Displaced intertrochanteric fracture of right femur, initial encounter for closed fracture: Principal | ICD-10-CM | POA: Diagnosis present

## 2023-05-14 DIAGNOSIS — I252 Old myocardial infarction: Secondary | ICD-10-CM

## 2023-05-14 DIAGNOSIS — I110 Hypertensive heart disease with heart failure: Secondary | ICD-10-CM | POA: Diagnosis not present

## 2023-05-14 DIAGNOSIS — I739 Peripheral vascular disease, unspecified: Secondary | ICD-10-CM | POA: Diagnosis present

## 2023-05-14 DIAGNOSIS — K219 Gastro-esophageal reflux disease without esophagitis: Secondary | ICD-10-CM | POA: Diagnosis present

## 2023-05-14 DIAGNOSIS — F32A Depression, unspecified: Secondary | ICD-10-CM | POA: Diagnosis present

## 2023-05-14 DIAGNOSIS — J449 Chronic obstructive pulmonary disease, unspecified: Secondary | ICD-10-CM | POA: Diagnosis present

## 2023-05-14 DIAGNOSIS — I130 Hypertensive heart and chronic kidney disease with heart failure and stage 1 through stage 4 chronic kidney disease, or unspecified chronic kidney disease: Secondary | ICD-10-CM | POA: Diagnosis present

## 2023-05-14 DIAGNOSIS — N1831 Chronic kidney disease, stage 3a: Secondary | ICD-10-CM | POA: Diagnosis present

## 2023-05-14 DIAGNOSIS — I5042 Chronic combined systolic (congestive) and diastolic (congestive) heart failure: Secondary | ICD-10-CM | POA: Diagnosis present

## 2023-05-14 DIAGNOSIS — Z87891 Personal history of nicotine dependence: Secondary | ICD-10-CM

## 2023-05-14 DIAGNOSIS — E440 Moderate protein-calorie malnutrition: Secondary | ICD-10-CM | POA: Diagnosis present

## 2023-05-14 DIAGNOSIS — Z8673 Personal history of transient ischemic attack (TIA), and cerebral infarction without residual deficits: Secondary | ICD-10-CM

## 2023-05-14 DIAGNOSIS — R32 Unspecified urinary incontinence: Secondary | ICD-10-CM | POA: Diagnosis present

## 2023-05-14 DIAGNOSIS — Z555 Less than a high school diploma: Secondary | ICD-10-CM | POA: Diagnosis not present

## 2023-05-14 DIAGNOSIS — M978XXA Periprosthetic fracture around other internal prosthetic joint, initial encounter: Principal | ICD-10-CM

## 2023-05-14 DIAGNOSIS — Z8679 Personal history of other diseases of the circulatory system: Secondary | ICD-10-CM

## 2023-05-14 DIAGNOSIS — I2510 Atherosclerotic heart disease of native coronary artery without angina pectoris: Secondary | ICD-10-CM | POA: Diagnosis present

## 2023-05-14 DIAGNOSIS — I509 Heart failure, unspecified: Secondary | ICD-10-CM | POA: Diagnosis not present

## 2023-05-14 DIAGNOSIS — Z79899 Other long term (current) drug therapy: Secondary | ICD-10-CM

## 2023-05-14 DIAGNOSIS — Z83438 Family history of other disorder of lipoprotein metabolism and other lipidemia: Secondary | ICD-10-CM | POA: Diagnosis not present

## 2023-05-14 DIAGNOSIS — I480 Paroxysmal atrial fibrillation: Secondary | ICD-10-CM | POA: Diagnosis present

## 2023-05-14 DIAGNOSIS — Z7901 Long term (current) use of anticoagulants: Secondary | ICD-10-CM

## 2023-05-14 DIAGNOSIS — N3090 Cystitis, unspecified without hematuria: Secondary | ICD-10-CM | POA: Diagnosis present

## 2023-05-14 DIAGNOSIS — Z85118 Personal history of other malignant neoplasm of bronchus and lung: Secondary | ICD-10-CM | POA: Diagnosis not present

## 2023-05-14 DIAGNOSIS — W06XXXA Fall from bed, initial encounter: Secondary | ICD-10-CM | POA: Diagnosis present

## 2023-05-14 DIAGNOSIS — M1990 Unspecified osteoarthritis, unspecified site: Secondary | ICD-10-CM | POA: Diagnosis present

## 2023-05-14 DIAGNOSIS — Z981 Arthrodesis status: Secondary | ICD-10-CM

## 2023-05-14 DIAGNOSIS — M9701XA Periprosthetic fracture around internal prosthetic right hip joint, initial encounter: Secondary | ICD-10-CM | POA: Diagnosis present

## 2023-05-14 DIAGNOSIS — N401 Enlarged prostate with lower urinary tract symptoms: Secondary | ICD-10-CM | POA: Diagnosis present

## 2023-05-14 DIAGNOSIS — Z6824 Body mass index (BMI) 24.0-24.9, adult: Secondary | ICD-10-CM

## 2023-05-14 DIAGNOSIS — E785 Hyperlipidemia, unspecified: Secondary | ICD-10-CM | POA: Diagnosis present

## 2023-05-14 DIAGNOSIS — S72001A Fracture of unspecified part of neck of right femur, initial encounter for closed fracture: Secondary | ICD-10-CM | POA: Diagnosis not present

## 2023-05-14 DIAGNOSIS — I10 Essential (primary) hypertension: Secondary | ICD-10-CM | POA: Diagnosis present

## 2023-05-14 DIAGNOSIS — Z9889 Other specified postprocedural states: Secondary | ICD-10-CM

## 2023-05-14 DIAGNOSIS — Z716 Tobacco abuse counseling: Secondary | ICD-10-CM

## 2023-05-14 DIAGNOSIS — Z9842 Cataract extraction status, left eye: Secondary | ICD-10-CM

## 2023-05-14 DIAGNOSIS — Z95828 Presence of other vascular implants and grafts: Secondary | ICD-10-CM

## 2023-05-14 LAB — URINALYSIS, ROUTINE W REFLEX MICROSCOPIC
Bilirubin Urine: NEGATIVE
Glucose, UA: NEGATIVE mg/dL
Ketones, ur: NEGATIVE mg/dL
Nitrite: NEGATIVE
Protein, ur: 30 mg/dL — AB
Specific Gravity, Urine: 1.011 (ref 1.005–1.030)
WBC, UA: >50 WBC/hpf (ref 0–5)
pH: 6 (ref 5.0–8.0)

## 2023-05-14 LAB — CBC WITH DIFFERENTIAL/PLATELET
Abs Immature Granulocytes: 0.06 10*3/uL (ref 0.00–0.07)
Basophils Absolute: 0 10*3/uL (ref 0.0–0.1)
Basophils Relative: 0 %
Eosinophils Absolute: 0 10*3/uL (ref 0.0–0.5)
Eosinophils Relative: 0 %
HCT: 34.8 % — ABNORMAL LOW (ref 39.0–52.0)
Hemoglobin: 11.5 g/dL — ABNORMAL LOW (ref 13.0–17.0)
Immature Granulocytes: 1 %
Lymphocytes Relative: 5 %
Lymphs Abs: 0.6 10*3/uL — ABNORMAL LOW (ref 0.7–4.0)
MCH: 31.3 pg (ref 26.0–34.0)
MCHC: 33 g/dL (ref 30.0–36.0)
MCV: 94.6 fL (ref 80.0–100.0)
Monocytes Absolute: 0.7 10*3/uL (ref 0.1–1.0)
Monocytes Relative: 6 %
Neutro Abs: 11 10*3/uL — ABNORMAL HIGH (ref 1.7–7.7)
Neutrophils Relative %: 88 %
Platelets: 159 10*3/uL (ref 150–400)
RBC: 3.68 MIL/uL — ABNORMAL LOW (ref 4.22–5.81)
RDW: 14.3 % (ref 11.5–15.5)
WBC: 12.4 10*3/uL — ABNORMAL HIGH (ref 4.0–10.5)
nRBC: 0 % (ref 0.0–0.2)

## 2023-05-14 LAB — BASIC METABOLIC PANEL WITH GFR
Anion gap: 12 (ref 5–15)
BUN: 21 mg/dL (ref 8–23)
CO2: 23 mmol/L (ref 22–32)
Calcium: 9.1 mg/dL (ref 8.9–10.3)
Chloride: 99 mmol/L (ref 98–111)
Creatinine, Ser: 1.43 mg/dL — ABNORMAL HIGH (ref 0.61–1.24)
GFR, Estimated: 51 mL/min — ABNORMAL LOW (ref >60)
Glucose, Bld: 127 mg/dL — ABNORMAL HIGH (ref 70–99)
Potassium: 3.5 mmol/L (ref 3.5–5.1)
Sodium: 134 mmol/L — ABNORMAL LOW (ref 135–145)

## 2023-05-14 LAB — CK: Total CK: 147 U/L (ref 49–397)

## 2023-05-14 MED ORDER — MORPHINE SULFATE (PF) 2 MG/ML IV SOLN
0.5000 mg | INTRAVENOUS | Status: DC | PRN
  Administered 2023-05-14 (×2): 0.5 mg via INTRAVENOUS
  Filled 2023-05-14 (×2): qty 1

## 2023-05-14 MED ORDER — CHLORTHALIDONE 25 MG PO TABS
25.0000 mg | ORAL_TABLET | Freq: Every day | ORAL | Status: DC
  Administered 2023-05-14 – 2023-05-20 (×6): 25 mg via ORAL
  Filled 2023-05-14 (×6): qty 1

## 2023-05-14 MED ORDER — FENTANYL CITRATE PF 50 MCG/ML IJ SOSY
25.0000 ug | PREFILLED_SYRINGE | Freq: Once | INTRAMUSCULAR | Status: AC
  Administered 2023-05-14: 25 ug via INTRAVENOUS
  Filled 2023-05-14: qty 1

## 2023-05-14 MED ORDER — HYDROCODONE-ACETAMINOPHEN 5-325 MG PO TABS
1.0000 | ORAL_TABLET | Freq: Four times a day (QID) | ORAL | Status: DC | PRN
  Administered 2023-05-14: 2 via ORAL
  Filled 2023-05-14: qty 2

## 2023-05-14 MED ORDER — ATORVASTATIN CALCIUM 40 MG PO TABS
80.0000 mg | ORAL_TABLET | Freq: Every day | ORAL | Status: DC
  Administered 2023-05-14 – 2023-05-19 (×6): 80 mg via ORAL
  Filled 2023-05-14 (×6): qty 2

## 2023-05-14 MED ORDER — POLYETHYLENE GLYCOL 3350 17 G PO PACK
17.0000 g | PACK | Freq: Every day | ORAL | Status: DC | PRN
  Administered 2023-05-18: 17 g via ORAL
  Filled 2023-05-14: qty 1

## 2023-05-14 MED ORDER — TAMSULOSIN HCL 0.4 MG PO CAPS
0.4000 mg | ORAL_CAPSULE | Freq: Every day | ORAL | Status: DC
  Administered 2023-05-14 – 2023-05-20 (×6): 0.4 mg via ORAL
  Filled 2023-05-14 (×6): qty 1

## 2023-05-14 MED ORDER — PANTOPRAZOLE SODIUM 40 MG PO TBEC
40.0000 mg | DELAYED_RELEASE_TABLET | Freq: Every day | ORAL | Status: DC
  Administered 2023-05-14 – 2023-05-20 (×6): 40 mg via ORAL
  Filled 2023-05-14 (×6): qty 1

## 2023-05-14 MED ORDER — BISACODYL 10 MG RE SUPP: 10.0000 mg | Freq: Every day | RECTAL | Status: DC | PRN

## 2023-05-14 MED ORDER — METOPROLOL SUCCINATE ER 50 MG PO TB24
25.0000 mg | ORAL_TABLET | Freq: Every day | ORAL | Status: DC
Start: 2023-05-14 — End: 2023-05-20
  Administered 2023-05-14 – 2023-05-20 (×6): 25 mg via ORAL
  Filled 2023-05-14 (×6): qty 1

## 2023-05-14 MED ORDER — MAGNESIUM CITRATE PO SOLN: 1.0000 | Freq: Once | ORAL | Status: DC | PRN

## 2023-05-14 MED ORDER — POTASSIUM CHLORIDE CRYS ER 20 MEQ PO TBCR
40.0000 meq | EXTENDED_RELEASE_TABLET | Freq: Once | ORAL | Status: AC
  Administered 2023-05-14: 40 meq via ORAL
  Filled 2023-05-14: qty 2

## 2023-05-14 MED ORDER — FOLIC ACID 1 MG PO TABS
1.0000 mg | ORAL_TABLET | Freq: Every day | ORAL | Status: DC
  Administered 2023-05-14 – 2023-05-20 (×6): 1 mg via ORAL
  Filled 2023-05-14 (×6): qty 1

## 2023-05-14 MED ORDER — UMECLIDINIUM BROMIDE 62.5 MCG/ACT IN AEPB
1.0000 | INHALATION_SPRAY | Freq: Every day | RESPIRATORY_TRACT | Status: DC
  Administered 2023-05-15 – 2023-05-20 (×5): 1 via RESPIRATORY_TRACT
  Filled 2023-05-14: qty 7

## 2023-05-14 MED ORDER — FENTANYL CITRATE PF 50 MCG/ML IJ SOSY
50.0000 ug | PREFILLED_SYRINGE | Freq: Once | INTRAMUSCULAR | Status: AC
  Administered 2023-05-14: 50 ug via INTRAVENOUS
  Filled 2023-05-14: qty 1

## 2023-05-14 MED ORDER — VITAMIN D 25 MCG (1000 UNIT) PO TABS
2000.0000 [IU] | ORAL_TABLET | Freq: Every day | ORAL | Status: DC
  Administered 2023-05-14 – 2023-05-20 (×6): 2000 [IU] via ORAL
  Filled 2023-05-14 (×6): qty 2

## 2023-05-14 MED ORDER — MAGNESIUM OXIDE -MG SUPPLEMENT 400 (240 MG) MG PO TABS
400.0000 mg | ORAL_TABLET | Freq: Every day | ORAL | Status: DC
  Administered 2023-05-14 – 2023-05-20 (×6): 400 mg via ORAL
  Filled 2023-05-14 (×6): qty 1

## 2023-05-14 MED ORDER — SENNA 8.6 MG PO TABS
1.0000 | ORAL_TABLET | Freq: Two times a day (BID) | ORAL | Status: DC
  Administered 2023-05-14 – 2023-05-20 (×11): 8.6 mg via ORAL
  Filled 2023-05-14 (×11): qty 1

## 2023-05-14 NOTE — ED Notes (Signed)
 Attempted to call report to [facility]. RN unavailable at this time and to call back.

## 2023-05-14 NOTE — ED Notes (Signed)
 Pt is an active cancer pt, on chemotherapy per family member at bedside

## 2023-05-14 NOTE — ED Triage Notes (Signed)
 Pr arrived via [facility] c/o a fall today out of bed, denies LOC, denies hitting head, endorses R hip pain, no obvious deformities noted to R leg. A&Ox4, pt does state he takes a blood thinner, Hx of CHF, lung cancer, HTN, and R hip surgery

## 2023-05-14 NOTE — Telephone Encounter (Signed)
 Please call patient MRI of the cervical spine showed multilevel degenerative changes, moderate canal stenosis at C5-6, C6-7, there was no evidence of cord signal abnormality  MRI of lumbar spine also showed multilevel degenerative changes, evidence of L5-S1 decompression, no canal stenosis but variable degree of foraminal narrowing  If he has a lot of question about his MRI findings may set up  virtual visit, IMPRESSION:    MRI cervical spine without contrast demonstrating: - At C5-6 disc bulging and facet hypertrophy with moderate spinal stenosis and severe bilateral foraminal stenosis. - At C6-7 disc bulging and facet hypertrophy with moderate spinal stenosis and mild bilateral foraminal stenosis. - No cord signal abnormalities.     MRI lumbar spine (without) demonstrating: - At L4-5: Disc bulging and facet hypertrophy with moderate to severe bilateral foraminal stenosis. - At L5-S1: Disc bulging and posterior decompression, with moderate bilateral foraminal stenosis; posterior lumbar decompression and fusion with metal hardware. - Bilateral renal cysts.

## 2023-05-14 NOTE — Plan of Care (Signed)
   Problem: Education: Goal: Knowledge of General Education information will improve Description Including pain rating scale, medication(s)/side effects and non-pharmacologic comfort measures Outcome: Progressing   Problem: Health Behavior/Discharge Planning: Goal: Ability to manage health-related needs will improve Outcome: Progressing

## 2023-05-14 NOTE — ED Provider Notes (Signed)
 [place] EMERGENCY DEPARTMENT AT [facility] Provider Note   CSN: [id number] Arrival date & time: 05/14/23  1014     History  Chief Complaint  Patient presents with   Darryl Ward is a 78 y.o. male.  This ER today for fall with right hip pain.  He fell a bit around 5 AM, was found at 8 AM, complaining of right hip pain.  He had repair of this hip operatively in the past.  He denies numbness tingling, no pain to the thigh knee, lower leg or ankle.  No numbness or tingling.  Patient has extensive past medical history including anemia, A-fib on Eliquis , CAD, peripheral vascular disease status post bilateral femoral artery bypass graft,, CHF, AAA.  Patient last took his Eliquis  last night   Fall       Home Medications Prior to Admission medications   Medication Sig Start Date End Date Taking? Authorizing Provider  albuterol  (VENTOLIN  HFA) 108 (90 Base) MCG/ACT inhaler Inhale 2 puffs into the lungs every 6 (six) hours as needed for wheezing or shortness of breath. 01/23/22   Denson Flake, MD  ALPRAZolam  (XANAX ) 0.5 MG tablet Take 1-2 tablets 30 minutes prior to MRI, may repeat once as needed. Must have driver. 03/31/23   Phebe Brasil, MD  apixaban  (ELIQUIS ) 5 MG TABS tablet Take 1 tablet (5 mg total) by mouth 2 (two) times daily. 06/19/22   Cook, Jayce G, DO  atorvastatin  (LIPITOR ) 80 MG tablet Take 1 tablet (80 mg total) by mouth daily. 06/19/22   Cook, Jayce G, DO  chlorthalidone  (HYGROTON ) 25 MG tablet Take 1 tablet (25 mg total) by mouth daily. 03/15/23   Mason Sole, Pratik D, DO  Cholecalciferol  50 MCG (2000 UT) CAPS Take 1 capsule by mouth daily.    [provider]  cyclobenzaprine  (FLEXERIL ) 10 MG tablet Take 1 tablet (10 mg total) by mouth 2 (two) times daily as needed. 03/14/23   Mason Sole, Pratik D, DO  escitalopram  (LEXAPRO ) 10 MG tablet Take 1 tablet (10 mg total) by mouth daily. 06/19/22   Cook, Jayce G, DO  folic acid  (KP FOLIC ACID ) 1 MG tablet Take 1  tablet (1 mg total) by mouth daily. 06/19/22   Cook, Jayce G, DO  furosemide  (LASIX ) 20 MG tablet Take 1 tablet (20 mg total) by mouth daily as needed. 04/21/23   Cook, Jayce G, DO  HYDROcodone -acetaminophen  (NORCO/VICODIN) 5-325 MG tablet Take 1 tablet by mouth every 8 (eight) hours as needed for severe pain (pain score 7-10). 04/25/23   Cook, Jayce G, DO  magnesium  oxide (MAG-OX) 400 (240 Mg) MG tablet Take 1 tablet (400 mg total) by mouth daily. 06/19/22   Cook, Jayce G, DO  meclizine  (ANTIVERT ) 25 MG tablet Take 1 tablet (25 mg total) by mouth 3 (three) times daily as needed for dizziness. 03/14/23   Mason Sole, Pratik D, DO  metoprolol  succinate (TOPROL -XL) 25 MG 24 hr tablet Take 1 tablet (25 mg total) by mouth daily. 02/04/23   Cook, Jayce G, DO  pantoprazole  (PROTONIX ) 40 MG tablet Take 1 tablet (40 mg total) by mouth daily. 06/19/22   Cook, Jayce G, DO  tamsulosin  (FLOMAX ) 0.4 MG CAPS capsule Take 1 capsule (0.4 mg total) by mouth daily. 06/12/22   Cook, Jayce G, DO  umeclidinium bromide  (INCRUSE ELLIPTA ) 62.5 MCG/ACT AEPB Inhale 1 puff into the lungs daily.    [provider]      Allergies    Patient  has no known allergies.    Review of Systems   Review of Systems  Physical Exam Updated Vital Signs BP 136/78   Pulse 72   Temp 98 F (36.7 C) (Oral)   Resp 17   Ht 5\' 10"  (1.778 m)   Wt 78.9 kg   SpO2 98%   BMI 24.97 kg/m  Physical Exam Vitals and nursing note reviewed.  Constitutional:      General: He is not in acute distress.    Appearance: He is well-developed.  HENT:     Head: Normocephalic and atraumatic.     Mouth/Throat:     Mouth: Mucous membranes are moist.  Eyes:     Conjunctiva/sclera: Conjunctivae normal.  Cardiovascular:     Rate and Rhythm: Normal rate and regular rhythm.     Heart sounds: No murmur heard. Pulmonary:     Effort: Pulmonary effort is normal. No respiratory distress.     Breath sounds: Normal breath sounds.  Abdominal:     Palpations:  Abdomen is soft.     Tenderness: There is no abdominal tenderness.  Musculoskeletal:        General: No swelling.     Cervical back: Neck supple.     Comments: Tenderness to right hip.  Not able to bear weight, No tenderness throughout the entirety of the remainder of the right leg, patient can bend knee and has normal range of motion of ankle.  History of PAD, pulses are not palpable bilaterally but good Doppler signals.    Skin:    General: Skin is warm and dry.     Capillary Refill: Capillary refill takes less than 2 seconds.  Neurological:     General: No focal deficit present.     Mental Status: He is alert and oriented to person, place, and time.     Sensory: No sensory deficit.     Motor: No weakness.  Psychiatric:        Mood and Affect: Mood normal.     ED Results / Procedures / Treatments   Labs (all labs ordered are listed, but only abnormal results are displayed) Labs Reviewed  CBC WITH DIFFERENTIAL/PLATELET - Abnormal; Notable for the following components:      Result Value   WBC 12.4 (*)    RBC 3.68 (*)    Hemoglobin 11.5 (*)    HCT 34.8 (*)    Neutro Abs 11.0 (*)    Lymphs Abs 0.6 (*)    All other components within normal limits  BASIC METABOLIC PANEL WITH GFR - Abnormal; Notable for the following components:   Sodium 134 (*)    Glucose, Bld 127 (*)    Creatinine, Ser 1.43 (*)    GFR, Estimated 51 (*)    All other components within normal limits  CK    EKG None  Radiology MR LUMBAR SPINE WO CONTRAST Result Date: 05/14/2023 Table formatting from the original result was not included. [facility] NEUROIMAGING REPORT STUDY DATE: 05/13/23 PATIENT NAME: GARI TUBRIDY DOB: 1945/02/14 MRN: [id number] ORDERING CLINICIAN: Phebe Brasil, MD CLINICAL HISTORY: 78 y.o. year old male with: 1. Cerebrovascular accident (CVA), unspecified mechanism (HCC)  2. Gait abnormality  3. Urinary incontinence, unspecified type   EXAM: MR LUMBAR SPINE WO CONTRAST  TECHNIQUE: MRI of the lumbar spine was obtained utilizing multiplanar, multiecho pulse sequences. CONTRAST: Diagnostic Product Medications (last 72 hours)   None   COMPARISON: none IMAGING SITE: [facility] [address]  [address] [place] [address] FINDINGS: On sagittal views the vertebral bodies have normal height and alignment.  Posterior lumbar decompression and fusion with pedicle screws and interbody fusion device spanning L5 and S1 levels.  The conus medullaris terminates at the level of L1.  On axial views: T12-L1: No spinal stenosis or foraminal narrowing L1-2: Disc bulging and facet hypertrophy with mild bilateral foraminal stenosis L2-3: Disc bulging and facet hypertrophy with mild bilateral foraminal stenosis L3-4: Disc bulging and facet hypertrophy with mild bilateral foraminal stenosis L4-5: Disc bulging and facet operatory with moderate to severe bilateral foraminal stenosis L5-S1: Disc bulging and posterior decompression, with moderate bilateral foraminal stenosis Limited views of the aorta, kidneys, iliopsoas muscles and sacroiliac joints are notable for bilateral renal cysts ranging from 0.6 to 3.0 cm in diameter.   MRI lumbar spine (without) demonstrating: - At L4-5: Disc bulging and facet hypertrophy with moderate to severe bilateral foraminal stenosis. - At L5-S1: Disc bulging and posterior decompression, with moderate bilateral foraminal stenosis; posterior lumbar decompression and fusion with metal hardware. - Bilateral renal cysts. INTERPRETING PHYSICIAN: Omega Bible, MD Certified in Neurology, Neurophysiology and Neuroimaging [facility] [address] [place], [place] [address] [phone]  MR CERVICAL SPINE WO CONTRAST Result Date: 05/14/2023 Table formatting from the original result was not included. [facility] NEUROIMAGING REPORT STUDY DATE: 05/13/23 PATIENT NAME: URBAIN EILER DOB: 1945-08-30 MRN: [id number] ORDERING CLINICIAN: Phebe Brasil, MD CLINICAL HISTORY: 78 y.o. year old male with: 1. Cerebrovascular accident (CVA), unspecified mechanism (HCC)  2. Gait abnormality  3. Urinary incontinence, unspecified type   EXAM: MR CERVICAL SPINE WO CONTRAST TECHNIQUE: MRI of the cervical spine was obtained utilizing multiplanar, multiecho pulse sequences. CONTRAST: Diagnostic Product Medications (last 72 hours)   None   COMPARISON: 02/26/23 CT IMAGING SITE: [facility] AT [address] [place] [address] FINDINGS: On sagittal views the vertebral bodies have normal height and alignment.  The spinal cord is normal in size and appearance. The posterior fossa, pituitary gland and paraspinal soft tissues are unremarkable.  On axial views: C2-3 no spinal stenosis or foraminal narrowing C3-4 no spinal stenosis or foraminal narrowing C4-5 small posterior disc protrusion and facet hypertrophy with mild spinal stenosis and no foraminal narrowing C5-6 disc bulging and facet hypertrophy with moderate spinal stenosis and severe bilateral foraminal stenosis C6-7 disc bulging and facet hypertrophy with moderate spinal stenosis and mild bilateral foraminal stenosis C7-T1 no spinal stenosis or foraminal narrowing T1-2 no spinal stenosis or foraminal narrowing Limited views of the soft tissues of the head and neck are unremarkable.   MRI cervical spine without contrast demonstrating: - At C5-6 disc bulging and facet hypertrophy with moderate spinal stenosis and severe bilateral foraminal stenosis. - At C6-7 disc bulging and facet hypertrophy with moderate spinal stenosis and mild bilateral foraminal stenosis. - No cord signal abnormalities. INTERPRETING PHYSICIAN: Omega Bible, MD Certified in Neurology, Neurophysiology and Neuroimaging [facility] [address] [place], [place] [address] [phone]  CT Hip  Right Wo Contrast Result Date: 05/14/2023 CLINICAL DATA:  Right hip pain after fall. Concern for periprosthetic fracture. EXAM: CT OF THE RIGHT HIP WITHOUT CONTRAST TECHNIQUE: Multidetector CT imaging of the right hip was performed according to the standard protocol. Multiplanar CT image reconstructions were also generated. RADIATION DOSE REDUCTION: This exam was performed according to the departmental dose-optimization program which includes automated exposure control, adjustment of  the mA and/or kV according to patient size and/or use of iterative reconstruction technique. COMPARISON:  Right hip radiographs dated 05/14/2023 at 11:23 a.m. FINDINGS: Bones/Joint/Cartilage Status post right hip arthroplasty with associated streak artifact which limits detailed evaluation of the surrounding bone and soft tissues. There is a fracture extending along the posterolateral proximal femoral metadiaphysis with approximately 6 mm of posterior displacement at the distal medial margin (series 3, images 89-96 and series 7, images 42-47). The femoral and acetabular prosthetic components are intact with appropriate alignment. Thin curvilinear mineralization adjacent to the posterior aspect of the posterior facet of the right greater trochanter, concerning for avulsion fracture. (Series 3, image 56 and series 7, image 43). The right sacroiliac joints and pubic symphysis are intact. Partially visualized lumbosacral fixation hardware. Ligaments Ligaments are suboptimally evaluated by CT. Soft tissue and Muscles Soft tissue swelling overlying the right lateral hip. No discrete intramuscular collection identified. Other: Air within the nondependent aspect of the distended bladder. Atherosclerotic vascular calcification. IMPRESSION: 1. Status post right hip arthroplasty with associated streak artifact which limits detailed evaluation of the surrounding bone and soft tissues. There is a mildly displaced periprosthetic fracture of the  posterior proximal femoral metadiaphysis. The femoral and acetabular prosthetic components are intact with appropriate alignment. 2. Thin curvilinear mineralization adjacent to the posterior aspect of the posterior facet of the right greater trochanter, concerning for avulsion fracture. 3. Air within the nondependent aspect of the distended bladder may be secondary to recent instrumentation or infection. Clinical correlation is recommended. Electronically Signed   By: Mannie Seek M.D.   On: 05/14/2023 15:07   DG Chest 1 View Result Date: 05/14/2023 CLINICAL DATA:  Chest pain EXAM: CHEST  1 VIEW COMPARISON:  March 10, 2023 FINDINGS: The heart size and mediastinal contours are within normal limits. Both lungs are clear. The visualized skeletal structures are unremarkable. Left IJ Infuse-A-Port catheter tip in the SVC No change in the chronic right apical pleural thickening. IMPRESSION: No acute cardiopulmonary process. Electronically Signed   By: Fredrich Jefferson M.D.   On: 05/14/2023 11:52   DG Hip Unilat W or Wo Pelvis 2-3 Views Right Result Date: 05/14/2023 CLINICAL DATA:  Status post fall and pain EXAM: DG HIP (WITH OR WITHOUT PELVIS) 2-3V RIGHT COMPARISON:  None Available. FINDINGS: There is no evidence of hip fracture or dislocation. There is no evidence of arthropathy or other focal bone abnormality. IMPRESSION: Total right hip arthroplasty., near anatomic alignment. No fractures Electronically Signed   By: Fredrich Jefferson M.D.   On: 05/14/2023 11:51   CT Head Wo Contrast Result Date: 05/14/2023 CLINICAL DATA:  Head trauma. EXAM: CT HEAD WITHOUT CONTRAST TECHNIQUE: Contiguous axial images were obtained from the base of the skull through the vertex without intravenous contrast. RADIATION DOSE REDUCTION: This exam was performed according to the departmental dose-optimization program which includes automated exposure control, adjustment of the mA and/or kV according to patient size and/or use of  iterative reconstruction technique. COMPARISON:  Head CT dated 03/10/2023 FINDINGS: Brain: Moderate age-related atrophy and chronic microvascular ischemic changes. Right basal ganglia old infarct as well as an old lacunar infarct versus a cyst in the left lentiform nucleus similar to prior CT. There is no acute intracranial hemorrhage. No mass effect or midline shift. No extra-axial fluid collection. Vascular: No hyperdense vessel or unexpected calcification. Skull: Normal. Negative for fracture or focal lesion. Sinuses/Orbits: The visualized paranasal sinuses are clear. Bilateral mastoid effusions. Other: None IMPRESSION: 1. No acute intracranial pathology. 2. Moderate  age-related atrophy and chronic microvascular ischemic changes. Electronically Signed   By: Angus Bark M.D.   On: 05/14/2023 11:37    Procedures Procedures    Medications Ordered in ED Medications  fentaNYL  (SUBLIMAZE ) injection 50 mcg (50 mcg Intravenous Given 05/14/23 1159)  fentaNYL  (SUBLIMAZE ) injection 25 mcg (25 mcg Intravenous Given 05/14/23 1424)    ED Course/ Medical Decision Making/ A&P                                 Medical Decision Making This patient presents to the ED for concern of right hip pain after fall, this involves an extensive number of treatment options, and is a complaint that carries with it a high risk of complications and morbidity.  The differential diagnosis includes fracture, dislocation, sprain, contusion, other   Co morbidities that complicate the patient evaluation :   CAD, A-fib, chronic anticoagulation, peripheral arterial disease   Additional history obtained:  Additional history obtained from EMR External records from outside source obtained and reviewed including prior notes and labs   Lab Tests:  I Ordered, and personally interpreted labs.  The pertinent results include:    CBC shows mild leukocytosis, hemoglobin 11.5 which is similar to prior baseline  Imaging Studies  ordered:  I ordered imaging studies including x-ray right hip which shows possible posterior proximal femur fracture I independently visualized and interpreted imaging within scope of identifying emergent findings  Read by radiologist as negative will obtain CT for further evaluation CT right hip shows periprosthetic hip fracture, I agree with radiology read     Consultations Obtained:  I requested consultation with the PA surgeon Dr. Phyllis Breeze, recommended reaching out to patient's orthopedic in [facility] who did his surgery as they cannot revise prosthetic hips here.  I spoke with Dr. Abigail Abler, he would like patient to be admitted to [facility] and plans to repair his hip tomorrow.  Would like his Eliquis  held and n.p.o. after midnight.    I also consulted with the hospitalist, Dr. Osborne Blazer who is agreeable with admission to [facility].  Problem List / ED Course / Critical interventions / Medication management  Right hip pain after rolling out of bed-patient has right periprosthetic hip fracture.  Will admit to [facility] with operative repair planned for tomorrow.  Patient and his daughter are agreeable with this I ordered medication including fentanyl  for pain  Reevaluation of the patient after these medicines showed that the patient improved I have reviewed the patients home medicines and have made adjustments as needed       Amount and/or Complexity of Data Reviewed Labs: ordered. Radiology: ordered.  Risk Prescription drug management. Decision regarding hospitalization.           Final Clinical Impression(s) / ED Diagnoses Final diagnoses:  Peri-prosthetic fracture around prosthetic hip, initial encounter    Rx / DC Orders ED Discharge Orders     None         Joshua Nieves 05/14/23 1806    Mordecai Applebaum, MD 05/17/23 [phone]

## 2023-05-14 NOTE — H&P (Signed)
 [facility] H&P   Patient Demographics:    Buren Havey, is a 78 y.o. male  MRN: [id number]   DOB - Jun 12, 1945  Admit Date - 05/14/2023  Outpatient Primary MD for the patient is Myrna Ast, DO  Referring MD/NP/PA: PA Celeste  Outpatient Specialists: Orthopedic Dr. Abigail Abler  Patient coming from: Home  Chief Complaint  Patient presents with   Fall      HPI:    Maxmilian Trostel  is a 78 y.o. male, with past medical history of congestive heart failure, peripheral vascular disease, CAD, hypertension, paroxysmal A-fib on Eliquis , primary adenocarcinoma of the right lung, following with Dr. 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 - In ED significant for mild white blood cell elevation at 12.4, creatinine at baseline 1.43, CK within normal limit, CT right hip significant for periprosthetic  fracture, ED discussed with Dr. Abigail Abler the primary orthopedic his right hip replacement last year, he requested admission to [facility] for surgical repair, [facility] consulted to admit.   Review of systems:      A full 10 point Review of Systems was done, except as stated above, all other Review of Systems were negative.   With Past History of the following :    Past Medical History:  Diagnosis Date   AAA (abdominal aortic aneurysm) (HCC)    Arthritis    Broken hip (HCC)    Cancer (HCC)    Carotid artery disease (HCC)    Nonobstructive   Cataract    COPD (chronic obstructive pulmonary  disease) (HCC)    Coronary atherosclerosis of native coronary artery    PTCA small diagonal 2007 otherwise nonobstructive CAD   Depression    Dysrhythmia    Essential hypertension, benign    Hyperlipidemia    NSTEMI (non-ST elevated myocardial infarction) (HCC) 2007   Stroke [facility]) 2004   Stroke [facility]) 2025   TIA (transient ischemic attack) 2006      Past Surgical History:  Procedure Laterality Date   AORTA - BILATERAL FEMORAL ARTERY BYPASS GRAFT  01/08/2012   Procedure: AORTA BIFEMORAL BYPASS GRAFT;  Surgeon: Richrd Char, MD;  Location: [facility] OR;  Service: Vascular;  Laterality: Bilateral;  using 18x79mm x 40cm Hemashield Gold Vascular Graft with Endarterectomy, Thombectomy and  Reimplantation of Inferior Mesenteric Artery   BACK SURGERY  2021   BRONCHIAL BIOPSY  06/11/2021   Procedure: BRONCHIAL BIOPSIES;  Surgeon: Denson Flake, MD;  Location:  [facility] ENDOSCOPY;  Service: Pulmonary;;   BRONCHIAL BRUSHINGS  06/11/2021   Procedure: BRONCHIAL BRUSHINGS;  Surgeon: Denson Flake, MD;  Location: [facility] ENDOSCOPY;  Service: Pulmonary;;   BRONCHIAL NEEDLE ASPIRATION BIOPSY  06/11/2021   Procedure: BRONCHIAL NEEDLE ASPIRATION BIOPSIES;  Surgeon: Denson Flake, MD;  Location: [facility] ENDOSCOPY;  Service: Pulmonary;;   CARDIOVERSION N/A 09/20/2021   Procedure: CARDIOVERSION;  Surgeon: Gerard Knight, MD;  Location: [facility] ORS;  Service: Cardiovascular;  Laterality: N/A;   COLONOSCOPY N/A 04/14/2019   Procedure: COLONOSCOPY;  Surgeon: Suzette Espy, MD;  Location: [facility] ENDO SUITE;  Service: Endoscopy;  Laterality: N/A;  9:30   COLONOSCOPY WITH PROPOFOL  N/A 07/25/2021   Procedure: COLONOSCOPY WITH PROPOFOL ;  Surgeon: Vinetta Greening, DO;  Location: [facility] ENDO SUITE;  Service: Endoscopy;  Laterality: N/A;  1:00pm   FIDUCIAL MARKER PLACEMENT  06/11/2021   Procedure: FIDUCIAL MARKER PLACEMENT;  Surgeon: Denson Flake, MD;  Location: [facility] ENDOSCOPY;  Service: Pulmonary;;   HIP ARTHROPLASTY Right 04/28/2022    Procedure: ARTHROPLASTY BIPOLAR HIP (HEMIARTHROPLASTY);  Surgeon: Saundra Curl, MD;  Location: [facility] OR;  Service: Orthopedics;  Laterality: Right;   INTERCOSTAL NERVE BLOCK Right 11/12/2021   Procedure: INTERCOSTAL NERVE BLOCK;  Surgeon: Zelphia Higashi, MD;  Location: [facility] OR;  Service: Thoracic;  Laterality: Right;   IR IMAGING GUIDED PORT INSERTION  07/31/2021   Left cataract surgery     LYMPH NODE DISSECTION Right 11/12/2021   Procedure: LYMPH NODE DISSECTION;  Surgeon: Zelphia Higashi, MD;  Location: [facility] OR;  Service: Thoracic;  Laterality: Right;   POLYPECTOMY  04/14/2019   Procedure: POLYPECTOMY;  Surgeon: Suzette Espy, MD;  [facility] ENDO SUITE;  Service: Endoscopy;;   POLYPECTOMY  07/25/2021   Procedure: POLYPECTOMY;  Surgeon: Vinetta Greening, DO;  Location: [facility] ENDO SUITE;  Service: Endoscopy;;   TEE WITHOUT CARDIOVERSION N/A 09/20/2021   Procedure: TRANSESOPHAGEAL ECHOCARDIOGRAM (TEE);  Surgeon: Gerard Knight, MD;  Location: [facility] ORS;  Service: Cardiovascular;  Laterality: N/A;   TRANSFORAMINAL LUMBAR INTERBODY FUSION (TLIF) WITH PEDICLE SCREW FIXATION 1 LEVEL N/A 04/27/2020   Procedure: Transforaminal Lumbar Interbody Fusion Lumbar Five-Sacral One;  Surgeon: Van Gelinas, MD;  Location: [facility] OR;  Service: Neurosurgery;  Laterality: N/A;   VIDEO BRONCHOSCOPY WITH INSERTION OF INTERBRONCHIAL VALVE (IBV) N/A 11/22/2021   Procedure: VIDEO BRONCHOSCOPY WITH INSERTION OF INTERBRONCHIAL VALVE (IBV);  Surgeon: Zelphia Higashi, MD;  Location: [facility] OR;  Service: Thoracic;  Laterality: N/A;   VIDEO BRONCHOSCOPY WITH INSERTION OF INTERBRONCHIAL VALVE (IBV) N/A 01/24/2022   Procedure: VIDEO BRONCHOSCOPY WITH REMOVAL OF INTERBRONCHIAL VALVE (IBV);  Surgeon: Zelphia Higashi, MD;  Location: [facility] OR;  Service: Thoracic;  Laterality: N/A;   VIDEO BRONCHOSCOPY WITH RADIAL ENDOBRONCHIAL ULTRASOUND  06/11/2021   Procedure: VIDEO BRONCHOSCOPY WITH RADIAL ENDOBRONCHIAL  ULTRASOUND;  Surgeon: Denson Flake, MD;  Location: [facility] ENDOSCOPY;  Service: Pulmonary;;      Social History:     Social History   Tobacco Use   Smoking status: Former    Current packs/day: 0.00    Average packs/day: 1 pack/day for 40.0 years (40.0 ttl pk-yrs)    Types: Cigarettes    Start date: 07/1981    Quit date: 07/2021    Years since quitting: 1.8   Smokeless tobacco: Never   Tobacco comments:    1 pack of cigarettes smoked daily. 07/17/21 ARJ, RN   Substance Use Topics   Alcohol use: No    Comment: Prior  history of regular alcohol use      Family History :     Family History  Problem Relation Age of Onset   Hyperlipidemia Sister    Heart attack Brother 24   Cancer - Colon Neg Hx       Home Medications:   Prior to Admission medications   Medication Sig Start Date End Date Taking? Authorizing Provider  albuterol  (VENTOLIN  HFA) 108 (90 Base) MCG/ACT inhaler Inhale 2 puffs into the lungs every 6 (six) hours as needed for wheezing or shortness of breath. 01/23/22  Yes Denson Flake, MD  ALPRAZolam  (XANAX ) 0.5 MG tablet Take 1-2 tablets 30 minutes prior to MRI, may repeat once as needed. Must have driver. 03/31/23  Yes Phebe Brasil, MD  apixaban  (ELIQUIS ) 5 MG TABS tablet Take 1 tablet (5 mg total) by mouth 2 (two) times daily. 06/19/22  Yes Cook, Jayce G, DO  atorvastatin  (LIPITOR ) 80 MG tablet Take 1 tablet (80 mg total) by mouth daily. 06/19/22  Yes Cook, Jayce G, DO  chlorthalidone  (HYGROTON ) 25 MG tablet Take 1 tablet (25 mg total) by mouth daily. 03/15/23  Yes Mason Sole, Pratik D, DO  Cholecalciferol  50 MCG (2000 UT) CAPS Take 1 capsule by mouth daily.   Yes [provider]  cyclobenzaprine  (FLEXERIL ) 10 MG tablet Take 1 tablet (10 mg total) by mouth 2 (two) times daily as needed. Patient taking differently: Take 10 mg by mouth 2 (two) times daily as needed for muscle spasms. 03/14/23  Yes Shah, Pratik D, DO  escitalopram  (LEXAPRO ) 10 MG tablet Take 1 tablet (10 mg  total) by mouth daily. 06/19/22  Yes Cook, Jayce G, DO  folic acid  (KP FOLIC ACID ) 1 MG tablet Take 1 tablet (1 mg total) by mouth daily. 06/19/22  Yes Cook, Jayce G, DO  furosemide  (LASIX ) 20 MG tablet Take 1 tablet (20 mg total) by mouth daily as needed. Patient taking differently: Take 20 mg by mouth daily as needed for fluid or edema. 04/21/23  Yes Cook, Jayce G, DO  HYDROcodone -acetaminophen  (NORCO/VICODIN) 5-325 MG tablet Take 1 tablet by mouth every 8 (eight) hours as needed for severe pain (pain score 7-10). 04/25/23  Yes Cook, Jayce G, DO  magnesium  oxide (MAG-OX) 400 (240 Mg) MG tablet Take 1 tablet (400 mg total) by mouth daily. 06/19/22  Yes Cook, Jayce G, DO  meclizine  (ANTIVERT ) 25 MG tablet Take 1 tablet (25 mg total) by mouth 3 (three) times daily as needed for dizziness. 03/14/23  Yes Mason Sole, Pratik D, DO  metoprolol  succinate (TOPROL -XL) 25 MG 24 hr tablet Take 1 tablet (25 mg total) by mouth daily. 02/04/23  Yes Cook, Jayce G, DO  pantoprazole  (PROTONIX ) 40 MG tablet Take 1 tablet (40 mg total) by mouth daily. 06/19/22  Yes Cook, Jayce G, DO  tamsulosin  (FLOMAX ) 0.4 MG CAPS capsule Take 1 capsule (0.4 mg total) by mouth daily. 06/12/22  Yes Cook, Jayce G, DO  umeclidinium bromide  (INCRUSE ELLIPTA ) 62.5 MCG/ACT AEPB Inhale 1 puff into the lungs daily.   Yes [provider]     Allergies:    No Known Allergies   Physical Exam:   Vitals  Blood pressure 124/70, pulse 96, temperature 98.2 F (36.8 C), temperature source Oral, resp. rate 17, height 5\' 10"  (1.778 m), weight 78.9 kg, SpO2 99%.   1. General Developed male, laying in bed, no apparent distress  2. Normal affect and insight, Not Suicidal or Homicidal, Awake Alert, Oriented X 3.  3. No  F.N deficits, ALL C.Nerves Intact, Strength 5/5 all 4 extremities, Sensation intact all 4 extremities, Plantars down going.  4. Ears and Eyes appear Normal, Conjunctivae clear, PERRLA. Moist Oral Mucosa.  5. Supple Neck, No JVD, No  cervical lymphadenopathy appriciated, No Carotid Bruits.  6. Symmetrical Chest wall movement, Good air movement bilaterally, CTAB.  7. RRR, No Gallops, Rubs or Murmurs, No Parasternal Heave.  8. Positive Bowel Sounds, Abdomen Soft, No tenderness, No organomegaly appriciated,No rebound -guarding or rigidity.  9.  No Cyanosis, Normal Skin Turgor, No Skin Rash or Bruise.  10.  Right lower extremity range of motion limited due to pain.  Data Review:    CBC Recent Labs  Lab 05/14/23 1203  WBC 12.4*  HGB 11.5*  HCT 34.8*  PLT 159  MCV 94.6  MCH 31.3  MCHC 33.0  RDW 14.3  LYMPHSABS 0.6*  MONOABS 0.7  EOSABS 0.0  BASOSABS 0.0   ------------------------------------------------------------------------------------------------------------------  Chemistries  Recent Labs  Lab 05/14/23 1203  NA 134*  K 3.5  CL 99  CO2 23  GLUCOSE 127*  BUN 21  CREATININE 1.43*  CALCIUM  9.1   ------------------------------------------------------------------------------------------------------------------ estimated creatinine clearance is 45.4 mL/min (A) (by C-G formula based on SCr of 1.43 mg/dL (H)). ------------------------------------------------------------------------------------------------------------------ No results for input(s): "TSH", "T4TOTAL", "T3FREE", "THYROIDAB" in the last 72 hours.  Invalid input(s): "FREET3"  Coagulation profile No results for input(s): "INR", "PROTIME" in the last 168 hours. ------------------------------------------------------------------------------------------------------------------- No results for input(s): "DDIMER" in the last 72 hours. -------------------------------------------------------------------------------------------------------------------  Cardiac Enzymes No results for input(s): "CKMB", "TROPONINI", "MYOGLOBIN" in the last 168 hours.  Invalid input(s):  "CK" ------------------------------------------------------------------------------------------------------------------    Component Value Date/Time   BNP 62.8 12/04/2021 2052     ---------------------------------------------------------------------------------------------------------------  Urinalysis    Component Value Date/Time   COLORURINE YELLOW 03/10/2023 2009   APPEARANCEUR CLEAR 03/10/2023 2009   LABSPEC 1.031 (H) 03/10/2023 2009   PHURINE 7.0 03/10/2023 2009   GLUCOSEU NEGATIVE 03/10/2023 2009   HGBUR NEGATIVE 03/10/2023 2009   BILIRUBINUR NEGATIVE 03/10/2023 2009   KETONESUR NEGATIVE 03/10/2023 2009   PROTEINUR NEGATIVE 03/10/2023 2009   UROBILINOGEN 1.0 01/10/2012 1728   NITRITE NEGATIVE 03/10/2023 2009   LEUKOCYTESUR NEGATIVE 03/10/2023 2009    ----------------------------------------------------------------------------------------------------------------   Imaging Results:    MR LUMBAR SPINE WO CONTRAST Result Date: 05/14/2023 Table formatting from the original result was not included. [facility] NEUROIMAGING REPORT STUDY DATE: 05/13/23 PATIENT NAME: KENYATTA KEIDEL DOB: 1945/08/02 MRN: [id number] ORDERING CLINICIAN: Phebe Brasil, MD CLINICAL HISTORY: 78 y.o. year old male with: 1. Cerebrovascular accident (CVA), unspecified mechanism (HCC)  2. Gait abnormality  3. Urinary incontinence, unspecified type   EXAM: MR LUMBAR SPINE WO CONTRAST TECHNIQUE: MRI of the lumbar spine was obtained utilizing multiplanar, multiecho pulse sequences. CONTRAST: Diagnostic Product Medications (last 72 hours)   None   COMPARISON: none IMAGING SITE: [facility] AT [address] [place] [address] FINDINGS: On sagittal views the vertebral bodies have normal height and alignment.  Posterior lumbar decompression and fusion with pedicle screws and interbody fusion device spanning L5 and S1 levels.  The conus medullaris  terminates at the level of L1.  On axial views: T12-L1: No spinal stenosis or foraminal narrowing L1-2: Disc bulging and facet hypertrophy with mild bilateral foraminal stenosis L2-3: Disc bulging and facet hypertrophy with mild bilateral foraminal stenosis L3-4: Disc bulging and facet hypertrophy with mild bilateral foraminal stenosis L4-5: Disc bulging and facet operatory with moderate to severe  bilateral foraminal stenosis L5-S1: Disc bulging and posterior decompression, with moderate bilateral foraminal stenosis Limited views of the aorta, kidneys, iliopsoas muscles and sacroiliac joints are notable for bilateral renal cysts ranging from 0.6 to 3.0 cm in diameter.   MRI lumbar spine (without) demonstrating: - At L4-5: Disc bulging and facet hypertrophy with moderate to severe bilateral foraminal stenosis. - At L5-S1: Disc bulging and posterior decompression, with moderate bilateral foraminal stenosis; posterior lumbar decompression and fusion with metal hardware. - Bilateral renal cysts. INTERPRETING PHYSICIAN: Omega Bible, MD Certified in Neurology, Neurophysiology and Neuroimaging [facility] [address], [address] [place], [place] [address] [phone]  MR CERVICAL SPINE WO CONTRAST Result Date: 05/14/2023 Table formatting from the original result was not included. [facility] NEUROIMAGING REPORT STUDY DATE: 05/13/23 PATIENT NAME: REINHART SAULTERS DOB: September 05, 1945 MRN: [id number] ORDERING CLINICIAN: Phebe Brasil, MD CLINICAL HISTORY: 78 y.o. year old male with: 1. Cerebrovascular accident (CVA), unspecified mechanism (HCC)  2. Gait abnormality  3. Urinary incontinence, unspecified type   EXAM: MR CERVICAL SPINE WO CONTRAST TECHNIQUE: MRI of the cervical spine was obtained utilizing multiplanar, multiecho pulse sequences. CONTRAST: Diagnostic Product Medications (last 72 hours)   None   COMPARISON: 02/26/23 CT IMAGING SITE: [facility] AT [address] [place] [address] FINDINGS: On sagittal views the vertebral bodies have normal height and alignment.  The spinal cord is normal in size and appearance. The posterior fossa, pituitary gland and paraspinal soft tissues are unremarkable.  On axial views: C2-3 no spinal stenosis or foraminal narrowing C3-4 no spinal stenosis or foraminal narrowing C4-5 small posterior disc protrusion and facet hypertrophy with mild spinal stenosis and no foraminal narrowing C5-6 disc bulging and facet hypertrophy with moderate spinal stenosis and severe bilateral foraminal stenosis C6-7 disc bulging and facet hypertrophy with moderate spinal stenosis and mild bilateral foraminal stenosis C7-T1 no spinal stenosis or foraminal narrowing T1-2 no spinal stenosis or foraminal narrowing Limited views of the soft tissues of the head and neck are unremarkable.   MRI cervical spine without contrast demonstrating: - At C5-6 disc bulging and facet hypertrophy with moderate spinal stenosis and severe bilateral foraminal stenosis. - At C6-7 disc bulging and facet hypertrophy with moderate spinal stenosis and mild bilateral foraminal stenosis. - No cord signal abnormalities. INTERPRETING PHYSICIAN: Omega Bible, MD Certified in Neurology, Neurophysiology and Neuroimaging [facility] [address] [place], [place] [address] [phone]  CT Hip Right Wo Contrast Result Date: 05/14/2023 CLINICAL DATA:  Right hip pain after fall. Concern for periprosthetic fracture. EXAM: CT OF THE RIGHT HIP WITHOUT CONTRAST TECHNIQUE: Multidetector CT imaging of the right hip was performed according to the standard protocol. Multiplanar CT image reconstructions were also generated. RADIATION DOSE REDUCTION: This exam was performed according to the departmental dose-optimization program which includes automated exposure control, adjustment of the mA and/or kV according to patient size  and/or use of iterative reconstruction technique. COMPARISON:  Right hip radiographs dated 05/14/2023 at 11:23 a.m. FINDINGS: Bones/Joint/Cartilage Status post right hip arthroplasty with associated streak artifact which limits detailed evaluation of the surrounding bone and soft tissues. There is a fracture extending along the posterolateral proximal femoral metadiaphysis with approximately 6 mm of posterior displacement at the distal medial margin (series 3, images 89-96 and series 7, images 42-47). The femoral and acetabular prosthetic components are intact with appropriate alignment. Thin curvilinear mineralization adjacent to the posterior aspect of the posterior facet  of the right greater trochanter, concerning for avulsion fracture. (Series 3, image 56 and series 7, image 43). The right sacroiliac joints and pubic symphysis are intact. Partially visualized lumbosacral fixation hardware. Ligaments Ligaments are suboptimally evaluated by CT. Soft tissue and Muscles Soft tissue swelling overlying the right lateral hip. No discrete intramuscular collection identified. Other: Air within the nondependent aspect of the distended bladder. Atherosclerotic vascular calcification. IMPRESSION: 1. Status post right hip arthroplasty with associated streak artifact which limits detailed evaluation of the surrounding bone and soft tissues. There is a mildly displaced periprosthetic fracture of the posterior proximal femoral metadiaphysis. The femoral and acetabular prosthetic components are intact with appropriate alignment. 2. Thin curvilinear mineralization adjacent to the posterior aspect of the posterior facet of the right greater trochanter, concerning for avulsion fracture. 3. Air within the nondependent aspect of the distended bladder may be secondary to recent instrumentation or infection. Clinical correlation is recommended. Electronically Signed   By: Mannie Seek M.D.   On: 05/14/2023 15:07   DG Chest 1  View Result Date: 05/14/2023 CLINICAL DATA:  Chest pain EXAM: CHEST  1 VIEW COMPARISON:  March 10, 2023 FINDINGS: The heart size and mediastinal contours are within normal limits. Both lungs are clear. The visualized skeletal structures are unremarkable. Left IJ Infuse-A-Port catheter tip in the SVC No change in the chronic right apical pleural thickening. IMPRESSION: No acute cardiopulmonary process. Electronically Signed   By: Fredrich Jefferson M.D.   On: 05/14/2023 11:52   DG Hip Unilat W or Wo Pelvis 2-3 Views Right Result Date: 05/14/2023 CLINICAL DATA:  Status post fall and pain EXAM: DG HIP (WITH OR WITHOUT PELVIS) 2-3V RIGHT COMPARISON:  None Available. FINDINGS: There is no evidence of hip fracture or dislocation. There is no evidence of arthropathy or other focal bone abnormality. IMPRESSION: Total right hip arthroplasty., near anatomic alignment. No fractures Electronically Signed   By: Fredrich Jefferson M.D.   On: 05/14/2023 11:51   CT Head Wo Contrast Result Date: 05/14/2023 CLINICAL DATA:  Head trauma. EXAM: CT HEAD WITHOUT CONTRAST TECHNIQUE: Contiguous axial images were obtained from the base of the skull through the vertex without intravenous contrast. RADIATION DOSE REDUCTION: This exam was performed according to the departmental dose-optimization program which includes automated exposure control, adjustment of the mA and/or kV according to patient size and/or use of iterative reconstruction technique. COMPARISON:  Head CT dated 03/10/2023 FINDINGS: Brain: Moderate age-related atrophy and chronic microvascular ischemic changes. Right basal ganglia old infarct as well as an old lacunar infarct versus a cyst in the left lentiform nucleus similar to prior CT. There is no acute intracranial hemorrhage. No mass effect or midline shift. No extra-axial fluid collection. Vascular: No hyperdense vessel or unexpected calcification. Skull: Normal. Negative for fracture or focal lesion. Sinuses/Orbits: The  visualized paranasal sinuses are clear. Bilateral mastoid effusions. Other: None IMPRESSION: 1. No acute intracranial pathology. 2. Moderate age-related atrophy and chronic microvascular ischemic changes. Electronically Signed   By: Angus Bark M.D.   On: 05/14/2023 11:37    EKG: Pending   Assessment & Plan:    Principal Problem:   Closed right hip fracture (HCC) Active Problems:   GERD without esophagitis   Paroxysmal atrial fibrillation (HCC)   Coronary atherosclerosis of native coronary artery   COPD (chronic obstructive pulmonary disease) (HCC)   Essential hypertension   Chronic combined systolic and diastolic CHF (congestive heart failure) (HCC)   Benign prostatic hyperplasia with lower urinary tract symptoms   Depression  Right hip fracture -Patient sustained a fall from the bed when he was rolling over, imaging significant for periprostatic right hip fracture. - Admitted under hip fracture pathway, plan for surgical repair tomorrow by Dr. Abigail Abler at [facility] -Minute in the left fracture pathway, continue with as needed pain medications, nausea medications -Holding Eliquis  in anticipation of surgery.  History of CVA -Hold Eliquis  in anticipation of surgery tomorrow, resume once cleared by orthopedic  History of GERD -Continue with PPI  Paroxysmal atrial fibrillation (HCC) - Continue with Toprol -XL -Resume Eliquis  when deemed by orthopedic  CKD stage III A - Creatinine at baseline   History of lung cancer Continue outpatient follow-up with Dr. Katragadda.   Depression -Continue Lexapro .   Benign prostatic hyperplasia with lower urinary tract symptoms -Continue treatment with Flomax . -Bladder looks distended on CT abdomen, will check bladder scan and if has high-volume will insert Foley catheter.   Essential hypertension - Continue current antihypertensive agents   COPD (chronic obstructive pulmonary disease) (HCC) - No active  wheezing -Continue bronchodilator management.   Tobacco abuse -Cessation counseling provided.    DVT Prophylaxis H Zuma Eliquis  when cleared by orthopedic  AM Labs Ordered, also please review Full Orders  Family Communication: Admission, patients condition and plan of care including tests being ordered have been discussed with the patient  who indicate understanding and agree with the plan and Code Status.  Code Status full code  Likely DC to he will need SNF  Consults called:.  Orthopedic Dr. Abigail Abler  Admission status: Inpatient  Time spent in minutes : 70-minute   Seena Dadds M.D on 05/14/2023 at 8:15 PM   [facility] - Office  [phone]

## 2023-05-15 ENCOUNTER — Inpatient Hospital Stay (HOSPITAL_COMMUNITY): Admitting: Certified Registered"

## 2023-05-15 ENCOUNTER — Inpatient Hospital Stay

## 2023-05-15 ENCOUNTER — Encounter (HOSPITAL_COMMUNITY)
Admission: EM | Disposition: A | Discharge status: Skilled Nursing Facility | Payer: Self-pay | Source: Home / Self Care | Attending: Family Medicine

## 2023-05-15 ENCOUNTER — Other Ambulatory Visit: Payer: Self-pay

## 2023-05-15 ENCOUNTER — Encounter (HOSPITAL_COMMUNITY): Payer: Self-pay | Admitting: Internal Medicine

## 2023-05-15 ENCOUNTER — Inpatient Hospital Stay: Admitting: Hematology

## 2023-05-15 DIAGNOSIS — M9701XA Periprosthetic fracture around internal prosthetic right hip joint, initial encounter: Secondary | ICD-10-CM | POA: Diagnosis not present

## 2023-05-15 DIAGNOSIS — I110 Hypertensive heart disease with heart failure: Secondary | ICD-10-CM

## 2023-05-15 DIAGNOSIS — I2510 Atherosclerotic heart disease of native coronary artery without angina pectoris: Secondary | ICD-10-CM | POA: Diagnosis not present

## 2023-05-15 DIAGNOSIS — E440 Moderate protein-calorie malnutrition: Secondary | ICD-10-CM | POA: Insufficient documentation

## 2023-05-15 DIAGNOSIS — S72001A Fracture of unspecified part of neck of right femur, initial encounter for closed fracture: Secondary | ICD-10-CM | POA: Diagnosis not present

## 2023-05-15 DIAGNOSIS — I509 Heart failure, unspecified: Secondary | ICD-10-CM

## 2023-05-15 HISTORY — PX: TOTAL HIP ARTHROPLASTY: SHX124

## 2023-05-15 LAB — BASIC METABOLIC PANEL WITH GFR
Anion gap: 9 (ref 5–15)
BUN: 19 mg/dL (ref 8–23)
CO2: 24 mmol/L (ref 22–32)
Calcium: 8.8 mg/dL — ABNORMAL LOW (ref 8.9–10.3)
Chloride: 104 mmol/L (ref 98–111)
Creatinine, Ser: 0.99 mg/dL (ref 0.61–1.24)
GFR, Estimated: >60 mL/min (ref >60)
Glucose, Bld: 111 mg/dL — ABNORMAL HIGH (ref 70–99)
Potassium: 3.7 mmol/L (ref 3.5–5.1)
Sodium: 137 mmol/L (ref 135–145)

## 2023-05-15 LAB — CBC
HCT: 31.6 % — ABNORMAL LOW (ref 39.0–52.0)
Hemoglobin: 10.5 g/dL — ABNORMAL LOW (ref 13.0–17.0)
MCH: 31.7 pg (ref 26.0–34.0)
MCHC: 33.2 g/dL (ref 30.0–36.0)
MCV: 95.5 fL (ref 80.0–100.0)
Platelets: 142 10*3/uL — ABNORMAL LOW (ref 150–400)
RBC: 3.31 MIL/uL — ABNORMAL LOW (ref 4.22–5.81)
RDW: 14.2 % (ref 11.5–15.5)
WBC: 7.8 10*3/uL (ref 4.0–10.5)
nRBC: 0 % (ref 0.0–0.2)

## 2023-05-15 LAB — MRSA NEXT GEN BY PCR, NASAL: MRSA by PCR Next Gen: NOT DETECTED

## 2023-05-15 LAB — TYPE AND SCREEN
ABO/RH(D): O NEG
Antibody Screen: NEGATIVE

## 2023-05-15 SURGERY — ARTHROPLASTY, HIP, TOTAL,POSTERIOR APPROACH
Anesthesia: General | Site: Hip | Laterality: Right

## 2023-05-15 MED ORDER — ENSURE ENLIVE PO LIQD
237.0000 mL | Freq: Three times a day (TID) | ORAL | Status: DC
  Administered 2023-05-15 – 2023-05-20 (×12): 237 mL via ORAL
  Filled 2023-05-15 (×4): qty 237

## 2023-05-15 MED ORDER — SODIUM CHLORIDE 0.9 % IV SOLN
1.0000 g | INTRAVENOUS | Status: DC
  Administered 2023-05-15 – 2023-05-20 (×6): 1 g via INTRAVENOUS
  Filled 2023-05-15 (×7): qty 10

## 2023-05-15 MED ORDER — ALUM & MAG HYDROXIDE-SIMETH 200-200-20 MG/5ML PO SUSP: 30.0000 mL | ORAL | Status: DC | PRN

## 2023-05-15 MED ORDER — ONDANSETRON HCL 4 MG/2ML IJ SOLN: 4.0000 mg | Freq: Four times a day (QID) | INTRAMUSCULAR | Status: DC | PRN

## 2023-05-15 MED ORDER — PROPOFOL 500 MG/50ML IV EMUL
INTRAVENOUS | Status: DC | PRN
  Administered 2023-05-15: 100 ug/kg/min via INTRAVENOUS

## 2023-05-15 MED ORDER — ALBUMIN HUMAN 5 % IV SOLN: INTRAVENOUS | Status: DC | PRN

## 2023-05-15 MED ORDER — LACTATED RINGERS IV SOLN: INTRAVENOUS | Status: DC | PRN

## 2023-05-15 MED ORDER — POVIDONE-IODINE 10 % EX SWAB: 2.0000 | Freq: Once | CUTANEOUS | Status: AC

## 2023-05-15 MED ORDER — PROPOFOL 10 MG/ML IV BOLUS
INTRAVENOUS | Status: DC | PRN
  Administered 2023-05-15: 100 mg via INTRAVENOUS

## 2023-05-15 MED ORDER — SUGAMMADEX SODIUM 200 MG/2ML IV SOLN
INTRAVENOUS | Status: DC | PRN
  Administered 2023-05-15: 200 mg via INTRAVENOUS

## 2023-05-15 MED ORDER — TRANEXAMIC ACID-NACL 1000-0.7 MG/100ML-% IV SOLN
1000.0000 mg | INTRAVENOUS | Status: AC
  Administered 2023-05-15: 1000 mg via INTRAVENOUS
  Filled 2023-05-15: qty 100

## 2023-05-15 MED ORDER — ROCURONIUM BROMIDE 10 MG/ML (PF) SYRINGE
PREFILLED_SYRINGE | INTRAVENOUS | Status: DC | PRN
  Administered 2023-05-15: 20 mg via INTRAVENOUS
  Administered 2023-05-15: 60 mg via INTRAVENOUS

## 2023-05-15 MED ORDER — SODIUM CHLORIDE (PF) 0.9 % IJ SOLN
INTRAMUSCULAR | Status: AC
Start: 2023-05-15 — End: ?
  Filled 2023-05-15: qty 50

## 2023-05-15 MED ORDER — METOCLOPRAMIDE HCL 5 MG/ML IJ SOLN: 5.0000 mg | Freq: Three times a day (TID) | INTRAMUSCULAR | Status: DC | PRN

## 2023-05-15 MED ORDER — METHOCARBAMOL 500 MG PO TABS
500.0000 mg | ORAL_TABLET | Freq: Four times a day (QID) | ORAL | Status: DC | PRN
  Administered 2023-05-16: 500 mg via ORAL
  Filled 2023-05-15: qty 1

## 2023-05-15 MED ORDER — ONDANSETRON HCL 4 MG PO TABS
4.0000 mg | ORAL_TABLET | Freq: Four times a day (QID) | ORAL | Status: DC | PRN
Start: 2023-05-15 — End: 2023-05-20

## 2023-05-15 MED ORDER — PHENOL 1.4 % MT LIQD: 1.0000 | OROMUCOSAL | Status: DC | PRN

## 2023-05-15 MED ORDER — ADULT MULTIVITAMIN W/MINERALS CH
1.0000 | ORAL_TABLET | Freq: Every day | ORAL | Status: DC
  Administered 2023-05-16 – 2023-05-20 (×5): 1 via ORAL
  Filled 2023-05-15 (×6): qty 1

## 2023-05-15 MED ORDER — OXYCODONE HCL 5 MG PO TABS
5.0000 mg | ORAL_TABLET | ORAL | Status: DC | PRN
  Administered 2023-05-16 – 2023-05-19 (×8): 5 mg via ORAL
  Filled 2023-05-15 (×9): qty 1

## 2023-05-15 MED ORDER — ACETAMINOPHEN 500 MG PO TABS
1000.0000 mg | ORAL_TABLET | Freq: Four times a day (QID) | ORAL | Status: AC
  Administered 2023-05-15 – 2023-05-16 (×4): 1000 mg via ORAL
  Filled 2023-05-15 (×4): qty 2

## 2023-05-15 MED ORDER — LACTATED RINGERS IV SOLN: INTRAVENOUS | Status: DC

## 2023-05-15 MED ORDER — ONDANSETRON HCL 4 MG/2ML IJ SOLN
INTRAMUSCULAR | Status: DC | PRN
  Administered 2023-05-15: 4 mg via INTRAVENOUS

## 2023-05-15 MED ORDER — SODIUM CHLORIDE 0.9 % IV SOLN
INTRAVENOUS | Status: DC | PRN
  Administered 2023-05-15: 60 mL

## 2023-05-15 MED ORDER — METOCLOPRAMIDE HCL 10 MG PO TABS: 5.0000 mg | ORAL_TABLET | Freq: Three times a day (TID) | ORAL | Status: DC | PRN

## 2023-05-15 MED ORDER — ACETAMINOPHEN 325 MG PO TABS
325.0000 mg | ORAL_TABLET | Freq: Four times a day (QID) | ORAL | Status: DC | PRN
  Administered 2023-05-18: 650 mg via ORAL
  Filled 2023-05-15: qty 2

## 2023-05-15 MED ORDER — DEXAMETHASONE SODIUM PHOSPHATE 10 MG/ML IJ SOLN
INTRAMUSCULAR | Status: DC | PRN
  Administered 2023-05-15: 10 mg via INTRAVENOUS

## 2023-05-15 MED ORDER — DOCUSATE SODIUM 100 MG PO CAPS
100.0000 mg | ORAL_CAPSULE | Freq: Two times a day (BID) | ORAL | Status: DC
  Administered 2023-05-15 – 2023-05-20 (×10): 100 mg via ORAL
  Filled 2023-05-15 (×10): qty 1

## 2023-05-15 MED ORDER — CEFAZOLIN SODIUM-DEXTROSE 2-4 GM/100ML-% IV SOLN
2.0000 g | INTRAVENOUS | Status: AC
  Administered 2023-05-15: 2 g via INTRAVENOUS
  Filled 2023-05-15: qty 100

## 2023-05-15 MED ORDER — PHENYLEPHRINE 80 MCG/ML (10ML) SYRINGE FOR IV PUSH (FOR BLOOD PRESSURE SUPPORT)
PREFILLED_SYRINGE | INTRAVENOUS | Status: DC | PRN
Start: 2023-05-15 — End: 2023-05-15
  Administered 2023-05-15 (×2): 160 ug via INTRAVENOUS

## 2023-05-15 MED ORDER — OXYCODONE HCL 5 MG PO TABS
2.5000 mg | ORAL_TABLET | ORAL | Status: DC | PRN
  Administered 2023-05-18: 2.5 mg via ORAL
  Filled 2023-05-15: qty 1

## 2023-05-15 MED ORDER — TRANEXAMIC ACID-NACL 1000-0.7 MG/100ML-% IV SOLN
1000.0000 mg | Freq: Once | INTRAVENOUS | Status: AC
  Administered 2023-05-15: 1000 mg via INTRAVENOUS
  Filled 2023-05-15: qty 100

## 2023-05-15 MED ORDER — CHLORHEXIDINE GLUCONATE CLOTH 2 % EX PADS
6.0000 | MEDICATED_PAD | Freq: Every day | CUTANEOUS | Status: DC
  Administered 2023-05-15 – 2023-05-17 (×3): 6 via TOPICAL

## 2023-05-15 MED ORDER — FENTANYL CITRATE (PF) 100 MCG/2ML IJ SOLN
INTRAMUSCULAR | Status: DC | PRN
  Administered 2023-05-15: 100 ug via INTRAVENOUS

## 2023-05-15 MED ORDER — HYDROMORPHONE HCL 1 MG/ML IJ SOLN
0.2500 mg | INTRAMUSCULAR | Status: DC | PRN
  Administered 2023-05-15 (×2): 0.25 mg via INTRAVENOUS
  Administered 2023-05-15: 0.5 mg via INTRAVENOUS

## 2023-05-15 MED ORDER — PHENYLEPHRINE 80 MCG/ML (10ML) SYRINGE FOR IV PUSH (FOR BLOOD PRESSURE SUPPORT)
PREFILLED_SYRINGE | INTRAVENOUS | Status: AC
  Filled 2023-05-15: qty 10

## 2023-05-15 MED ORDER — HYDROMORPHONE HCL 1 MG/ML IJ SOLN
0.5000 mg | INTRAMUSCULAR | Status: DC | PRN
  Administered 2023-05-15 – 2023-05-19 (×6): 0.5 mg via INTRAVENOUS
  Filled 2023-05-15 (×6): qty 0.5

## 2023-05-15 MED ORDER — LIDOCAINE HCL (CARDIAC) PF 100 MG/5ML IV SOSY
PREFILLED_SYRINGE | INTRAVENOUS | Status: DC | PRN
  Administered 2023-05-15: 100 mg via INTRAVENOUS

## 2023-05-15 MED ORDER — PROPOFOL 500 MG/50ML IV EMUL: INTRAVENOUS | Status: DC | PRN

## 2023-05-15 MED ORDER — METHOCARBAMOL 1000 MG/10ML IJ SOLN: 500.0000 mg | Freq: Four times a day (QID) | INTRAMUSCULAR | Status: DC | PRN

## 2023-05-15 MED ORDER — PHENYLEPHRINE HCL-NACL 20-0.9 MG/250ML-% IV SOLN
INTRAVENOUS | Status: DC | PRN
  Administered 2023-05-15: 25 ug/min via INTRAVENOUS

## 2023-05-15 MED ORDER — BUPIVACAINE LIPOSOME 1.3 % IJ SUSP
INTRAMUSCULAR | Status: AC
  Filled 2023-05-15: qty 20

## 2023-05-15 MED ORDER — ACETAMINOPHEN 500 MG PO TABS
1000.0000 mg | ORAL_TABLET | Freq: Once | ORAL | Status: AC
  Administered 2023-05-15: 1000 mg via ORAL
  Filled 2023-05-15: qty 2

## 2023-05-15 MED ORDER — MENTHOL 3 MG MT LOZG: 1.0000 | LOZENGE | OROMUCOSAL | Status: DC | PRN

## 2023-05-15 MED ORDER — SODIUM CHLORIDE 0.9 % IR SOLN
Status: DC | PRN
  Administered 2023-05-15: 1000 mL

## 2023-05-15 MED ORDER — HYDROMORPHONE HCL 1 MG/ML IJ SOLN
INTRAMUSCULAR | Status: AC
Start: 2023-05-15 — End: 2023-05-15
  Filled 2023-05-15: qty 1

## 2023-05-15 MED ORDER — PROPOFOL 10 MG/ML IV BOLUS
INTRAVENOUS | Status: AC
  Filled 2023-05-15: qty 20

## 2023-05-15 MED ORDER — FENTANYL CITRATE (PF) 100 MCG/2ML IJ SOLN
INTRAMUSCULAR | Status: AC
  Filled 2023-05-15: qty 2

## 2023-05-15 MED ORDER — IRRISEPT - 450ML BOTTLE WITH 0.05% CHG IN STERILE WATER, USP 99.95% OPTIME
TOPICAL | Status: DC | PRN
  Administered 2023-05-15: 450 mL

## 2023-05-15 SURGICAL SUPPLY — 55 items
BAG COUNTER SPONGE SURGICOUNT (BAG) IMPLANT
BLADE SAW SGTL 73X25 THK (BLADE) ×1 IMPLANT
BLADE SURG SZ10 CARB STEEL (BLADE) ×2 IMPLANT
BRUSH FEMORAL CANAL (MISCELLANEOUS) IMPLANT
CHLORAPREP W/TINT 26 (MISCELLANEOUS) ×1 IMPLANT
COVER SURGICAL LIGHT HANDLE (MISCELLANEOUS) ×1 IMPLANT
DRAPE IMP U-DRAPE 54X76 (DRAPES) ×1 IMPLANT
DRAPE INCISE IOBAN 66X45 STRL (DRAPES) ×1 IMPLANT
DRAPE SURG ORHT 6 SPLT 77X108 (DRAPES) ×2 IMPLANT
DRAPE U-SHAPE 47X51 STRL (DRAPES) ×1 IMPLANT
DRSG MEPILEX POST OP 4X12 (GAUZE/BANDAGES/DRESSINGS) IMPLANT
DRSG MEPILEX POST OP 4X8 (GAUZE/BANDAGES/DRESSINGS) ×1 IMPLANT
ELECT BLADE TIP CTD 4 INCH (ELECTRODE) ×1 IMPLANT
ELECT PENCIL ROCKER SW 15FT (MISCELLANEOUS) ×1 IMPLANT
ELECT REM PT RETURN 15FT ADLT (MISCELLANEOUS) ×1 IMPLANT
EVACUATOR 1/8 PVC DRAIN (DRAIN) IMPLANT
FACESHIELD WRAPAROUND (MASK) ×4 IMPLANT
FACESHIELD WRAPAROUND OR TEAM (MASK) ×4 IMPLANT
GLOVE BIO SURGEON STRL SZ7.5 (GLOVE) ×2 IMPLANT
GLOVE BIOGEL PI IND STRL 7.5 (GLOVE) ×1 IMPLANT
GLOVE BIOGEL PI IND STRL 8 (GLOVE) ×1 IMPLANT
GLOVE SURG SYN 7.0 (GLOVE) ×1 IMPLANT
GLOVE SURG SYN 7.0 PF PI (GLOVE) ×1 IMPLANT
GOWN STRL REUS W/ TWL LRG LVL3 (GOWN DISPOSABLE) ×1 IMPLANT
GOWN STRL REUS W/ TWL XL LVL3 (GOWN DISPOSABLE) ×1 IMPLANT
HEAD UNPLR 53XMDLR STRL HIP (Orthopedic Implant) IMPLANT
KIT BASIN OR (CUSTOM PROCEDURE TRAY) ×1 IMPLANT
KIT TURNOVER KIT A (KITS) IMPLANT
LAVAGE JET IRRISEPT WOUND (IRRIGATION / IRRIGATOR) IMPLANT
MANIFOLD NEPTUNE II (INSTRUMENTS) ×1 IMPLANT
NS IRRIG 1000ML POUR BTL (IV SOLUTION) ×1 IMPLANT
PACK TOTAL JOINT (CUSTOM PROCEDURE TRAY) ×1 IMPLANT
PRESSURIZER FEMORAL UNIV (MISCELLANEOUS) IMPLANT
PROTECTOR NERVE ULNAR (MISCELLANEOUS) ×2 IMPLANT
RETRIEVER SUT HEWSON (MISCELLANEOUS) ×1 IMPLANT
SLEEVE CABLE 2MM VT (Orthopedic Implant) IMPLANT
SLEEVE UNITRAX CTAPER (Orthopedic Implant) IMPLANT
STAPLER SKIN PROX WIDE 3.9 (STAPLE) ×1 IMPLANT
STEM FEMORAL SZ9 15MMX155MM (Stem) IMPLANT
STRIP CLOSURE SKIN 1/2X4 (GAUZE/BANDAGES/DRESSINGS) IMPLANT
STRIP CLOSURE SKIN 1/4X4 (GAUZE/BANDAGES/DRESSINGS) ×1 IMPLANT
SUCTION TUBE FRAZIER 12FR DISP (SUCTIONS) ×1 IMPLANT
SUT MNCRL AB 4-0 PS2 18 (SUTURE) ×1 IMPLANT
SUT VIC AB 0 CT1 27XBRD ANTBC (SUTURE) IMPLANT
SUT VIC AB 0 CT1 36 (SUTURE) ×3 IMPLANT
SUT VIC AB 1 CT1 36 (SUTURE) IMPLANT
SUT VIC AB 1 CTX36XBRD ANBCTR (SUTURE) ×2 IMPLANT
SUT VIC AB 2-0 CT1 TAPERPNT 27 (SUTURE) ×2 IMPLANT
SUTURE FIBERWR #2 38 T-5 BLUE (SUTURE) IMPLANT
SUTURE STRATFX 0 PDS 27 VIOLET (SUTURE) ×1 IMPLANT
SYR 50ML LL SCALE MARK (SYRINGE) ×1 IMPLANT
TOWEL OR 17X26 10 PK STRL BLUE (TOWEL DISPOSABLE) ×1 IMPLANT
TRAY CATH INTERMITTENT SS 16FR (CATHETERS) IMPLANT
TRAY FOLEY MTR SLVR 16FR STAT (SET/KITS/TRAYS/PACK) ×1 IMPLANT
WATER STERILE IRR 1000ML POUR (IV SOLUTION) ×2 IMPLANT

## 2023-05-15 NOTE — Consult Note (Signed)
 Unfortunately Darryl Ward has suffered a periprosthetic right hip fracture around his hemiarthroplasty.  The stem is unstable based on imaging.  Plan is for hardware removal ORIF and revision hemiarthroplasty today.   Formal consult to follow   Saundra Curl

## 2023-05-15 NOTE — Discharge Instructions (Addendum)

## 2023-05-15 NOTE — H&P (View-Only) (Signed)
 ORTHOPAEDIC CONSULTATION  REQUESTING PHYSICIAN: Magdalene School, MD  Time called na Time arrived na  Chief Complaint: Right periprosthetic hip fracture  HPI: Darryl Ward is a 78 y.o. male who complains of a fall out of bed onto the right side had immediate pain and inability to move or weight-bear on the leg.  He was seen in [facility] emergency room imaging confirms right periprosthetic femur fracture with unstable stem  Past Medical History:  Diagnosis Date   AAA (abdominal aortic aneurysm) (HCC)    Arthritis    Broken hip (HCC)    Cancer (HCC)    Carotid artery disease (HCC)    Nonobstructive   Cataract    COPD (chronic obstructive pulmonary disease) (HCC)    Coronary atherosclerosis of native coronary artery    PTCA small diagonal 2007 otherwise nonobstructive CAD   Depression    Dysrhythmia    Essential hypertension, benign    Hyperlipidemia    NSTEMI (non-ST elevated myocardial infarction) (HCC) 2007   Stroke [facility]) 2004   Stroke [facility]) 2025   TIA (transient ischemic attack) 2006   Past Surgical History:  Procedure Laterality Date   AORTA - BILATERAL FEMORAL ARTERY BYPASS GRAFT  01/08/2012   Procedure: AORTA BIFEMORAL BYPASS GRAFT;  Surgeon: Richrd Char, MD;  Location: [facility] OR;  Service: Vascular;  Laterality: Bilateral;  using 18x33mm x 40cm Hemashield Gold Vascular Graft with Endarterectomy, Thombectomy and  Reimplantation of Inferior Mesenteric Artery   BACK SURGERY  2021   BRONCHIAL BIOPSY  06/11/2021   Procedure: BRONCHIAL BIOPSIES;  Surgeon: Denson Flake, MD;  Location: [facility] ENDOSCOPY;  Service: Pulmonary;;   BRONCHIAL BRUSHINGS  06/11/2021   Procedure: BRONCHIAL BRUSHINGS;  Surgeon: Denson Flake, MD;  Location: [facility] ENDOSCOPY;  Service: Pulmonary;;   BRONCHIAL NEEDLE ASPIRATION BIOPSY  06/11/2021   Procedure: BRONCHIAL NEEDLE ASPIRATION BIOPSIES;  Surgeon: Denson Flake, MD;  Location: [facility] ENDOSCOPY;  Service: Pulmonary;;   CARDIOVERSION N/A  09/20/2021   Procedure: CARDIOVERSION;  Surgeon: Gerard Knight, MD;  Location: [facility] ORS;  Service: Cardiovascular;  Laterality: N/A;   COLONOSCOPY N/A 04/14/2019   Procedure: COLONOSCOPY;  Surgeon: Suzette Espy, MD;  Location: [facility] ENDO SUITE;  Service: Endoscopy;  Laterality: N/A;  9:30   COLONOSCOPY WITH PROPOFOL  N/A 07/25/2021   Procedure: COLONOSCOPY WITH PROPOFOL ;  Surgeon: Vinetta Greening, DO;  Location: [facility] ENDO SUITE;  Service: Endoscopy;  Laterality: N/A;  1:00pm   FIDUCIAL MARKER PLACEMENT  06/11/2021   Procedure: FIDUCIAL MARKER PLACEMENT;  Surgeon: Denson Flake, MD;  Location: [facility] ENDOSCOPY;  Service: Pulmonary;;   HIP ARTHROPLASTY Right 04/28/2022   Procedure: ARTHROPLASTY BIPOLAR HIP (HEMIARTHROPLASTY);  Surgeon: Saundra Curl, MD;  Location: [facility] OR;  Service: Orthopedics;  Laterality: Right;   INTERCOSTAL NERVE BLOCK Right 11/12/2021   Procedure: INTERCOSTAL NERVE BLOCK;  Surgeon: Zelphia Higashi, MD;  Location: [facility] OR;  Service: Thoracic;  Laterality: Right;   IR IMAGING GUIDED PORT INSERTION  07/31/2021   Left cataract surgery     LYMPH NODE DISSECTION Right 11/12/2021   Procedure: LYMPH NODE DISSECTION;  Surgeon: Zelphia Higashi, MD;  Location: [facility] OR;  Service: Thoracic;  Laterality: Right;   POLYPECTOMY  04/14/2019   Procedure: POLYPECTOMY;  Surgeon: Suzette Espy, MD;  [facility] ENDO [facility];  Service: Endoscopy;;   POLYPECTOMY  07/25/2021   Procedure: POLYPECTOMY;  Surgeon: Vinetta Greening, DO;  Location[facility] ENDO SUITE;  Service: Endoscopy;;   TEE  WITHOUT CARDIOVERSION N/A 09/20/2021   Procedure: TRANSESOPHAGEAL ECHOCARDIOGRAM (TEE);  Surgeon: Gerard Knight, MD;  Location: [facility] ORS;  Service: Cardiovascular;  Laterality: N/A;   TRANSFORAMINAL LUMBAR INTERBODY FUSION (TLIF) WITH PEDICLE SCREW FIXATION 1 LEVEL N/A 04/27/2020   Procedure: Transforaminal Lumbar Interbody Fusion Lumbar Five-Sacral One;  Surgeon: Van Gelinas, MD;  Location: [facility]  OR;  Service: Neurosurgery;  Laterality: N/A;   VIDEO BRONCHOSCOPY WITH INSERTION OF INTERBRONCHIAL VALVE (IBV) N/A 11/22/2021   Procedure: VIDEO BRONCHOSCOPY WITH INSERTION OF INTERBRONCHIAL VALVE (IBV);  Surgeon: Zelphia Higashi, MD;  Location: [facility] OR;  Service: Thoracic;  Laterality: N/A;   VIDEO BRONCHOSCOPY WITH INSERTION OF INTERBRONCHIAL VALVE (IBV) N/A 01/24/2022   Procedure: VIDEO BRONCHOSCOPY WITH REMOVAL OF INTERBRONCHIAL VALVE (IBV);  Surgeon: Zelphia Higashi, MD;  Location: [facility] OR;  Service: Thoracic;  Laterality: N/A;   VIDEO BRONCHOSCOPY WITH RADIAL ENDOBRONCHIAL ULTRASOUND  06/11/2021   Procedure: VIDEO BRONCHOSCOPY WITH RADIAL ENDOBRONCHIAL ULTRASOUND;  Surgeon: Denson Flake, MD;  Location: [facility] ENDOSCOPY;  Service: Pulmonary;;   Social History   Socioeconomic History   Marital status: Divorced    Spouse name: Not on file   Number of children: 1   Years of education: 11   Highest education level: 11th grade  Occupational History    Employer: Engineer, materials  Tobacco Use   Smoking status: Former    Current packs/day: 0.00    Average packs/day: 1 pack/day for 40.0 years (40.0 ttl pk-yrs)    Types: Cigarettes    Start date: 07/1981    Quit date: 07/2021    Years since quitting: 1.8   Smokeless tobacco: Never   Tobacco comments:    1 pack of cigarettes smoked daily. 07/17/21 ARJ, RN   Vaping Use   Vaping status: Never Used  Substance and Sexual Activity   Alcohol use: No    Comment: Prior history of regular alcohol use   Drug use: No   Sexual activity: Not Currently  Other Topics Concern   Not on file  Social History Narrative   Not on file   Social Drivers of Health   Financial Resource Strain: High Risk (08/30/2022)   Overall Financial Resource Strain (CARDIA)    Difficulty of Paying Living Expenses: Hard  Food Insecurity: No Food Insecurity (05/14/2023)   Hunger Vital Sign    Worried About Running Out of Food in the Last Year: Never true    Ran  Out of Food in the Last Year: Never true  Transportation Needs: No Transportation Needs (05/14/2023)   PRAPARE - Administrator, Civil Service (Medical): No    Lack of Transportation (Non-Medical): No  Physical Activity: Insufficiently Active (08/30/2022)   Exercise Vital Sign    Days of Exercise per Week: 7 days    Minutes of Exercise per Session: 20 min  Stress: No Stress Concern Present (08/30/2022)   [employer] of Occupational Health - Occupational Stress Questionnaire    Feeling of Stress : Not at all  Social Connections: Moderately Isolated (05/14/2023)   Social Connection and Isolation Panel [NHANES]    Frequency of Communication with Friends and Family: Twice a week    Frequency of Social Gatherings with Friends and Family: Three times a week    Attends Religious Services: Never    Active Member of Clubs or Organizations: No    Attends Banker Meetings: Never    Marital Status: Married   Family History  Problem Relation Age of Onset  Hyperlipidemia Sister    Heart attack Brother 85   Cancer - Colon Neg Hx    No Known Allergies Prior to Admission medications   Medication Sig Start Date End Date Taking? Authorizing Provider  albuterol  (VENTOLIN  HFA) 108 (90 Base) MCG/ACT inhaler Inhale 2 puffs into the lungs every 6 (six) hours as needed for wheezing or shortness of breath. 01/23/22  Yes Denson Flake, MD  ALPRAZolam  (XANAX ) 0.5 MG tablet Take 1-2 tablets 30 minutes prior to MRI, may repeat once as needed. Must have driver. 03/31/23  Yes Phebe Brasil, MD  apixaban  (ELIQUIS ) 5 MG TABS tablet Take 1 tablet (5 mg total) by mouth 2 (two) times daily. 06/19/22  Yes Cook, Jayce G, DO  atorvastatin  (LIPITOR ) 80 MG tablet Take 1 tablet (80 mg total) by mouth daily. 06/19/22  Yes Cook, Jayce G, DO  chlorthalidone  (HYGROTON ) 25 MG tablet Take 1 tablet (25 mg total) by mouth daily. 03/15/23  Yes Shah, Pratik D, DO  Cholecalciferol  50 MCG (2000 UT) CAPS Take 1 capsule  by mouth daily.   Yes [provider]  cyclobenzaprine  (FLEXERIL ) 10 MG tablet Take 1 tablet (10 mg total) by mouth 2 (two) times daily as needed. Patient taking differently: Take 10 mg by mouth 2 (two) times daily as needed for muscle spasms. 03/14/23  Yes Shah, Pratik D, DO  escitalopram  (LEXAPRO ) 10 MG tablet Take 1 tablet (10 mg total) by mouth daily. 06/19/22  Yes Cook, Jayce G, DO  folic acid  (KP FOLIC ACID ) 1 MG tablet Take 1 tablet (1 mg total) by mouth daily. 06/19/22  Yes Cook, Jayce G, DO  furosemide  (LASIX ) 20 MG tablet Take 1 tablet (20 mg total) by mouth daily as needed. Patient taking differently: Take 20 mg by mouth daily as needed for fluid or edema. 04/21/23  Yes Cook, Jayce G, DO  HYDROcodone -acetaminophen  (NORCO/VICODIN) 5-325 MG tablet Take 1 tablet by mouth every 8 (eight) hours as needed for severe pain (pain score 7-10). 04/25/23  Yes Cook, Jayce G, DO  magnesium  oxide (MAG-OX) 400 (240 Mg) MG tablet Take 1 tablet (400 mg total) by mouth daily. 06/19/22  Yes Cook, Jayce G, DO  meclizine  (ANTIVERT ) 25 MG tablet Take 1 tablet (25 mg total) by mouth 3 (three) times daily as needed for dizziness. 03/14/23  Yes Mason Sole, Pratik D, DO  metoprolol  succinate (TOPROL -XL) 25 MG 24 hr tablet Take 1 tablet (25 mg total) by mouth daily. 02/04/23  Yes Cook, Jayce G, DO  pantoprazole  (PROTONIX ) 40 MG tablet Take 1 tablet (40 mg total) by mouth daily. 06/19/22  Yes Cook, Jayce G, DO  tamsulosin  (FLOMAX ) 0.4 MG CAPS capsule Take 1 capsule (0.4 mg total) by mouth daily. 06/12/22  Yes Cook, Jayce G, DO  umeclidinium bromide  (INCRUSE ELLIPTA ) 62.5 MCG/ACT AEPB Inhale 1 puff into the lungs daily.   Yes [provider]   MR LUMBAR SPINE WO CONTRAST Result Date: 05/14/2023 Table formatting from the original result was not included. [facility] NEUROIMAGING REPORT STUDY DATE: 05/13/23 PATIENT NAME: Darryl Ward DOB: 12-Dec-1945 MRN: [id number] ORDERING CLINICIAN: Phebe Brasil, MD  CLINICAL HISTORY: 78 y.o. year old male with: 1. Cerebrovascular accident (CVA), unspecified mechanism (HCC)  2. Gait abnormality  3. Urinary incontinence, unspecified type   EXAM: MR LUMBAR SPINE WO CONTRAST TECHNIQUE: MRI of the lumbar spine was obtained utilizing multiplanar, multiecho pulse sequences. CONTRAST: Diagnostic Product Medications (last 72 hours)   None   COMPARISON: none IMAGING SITE: [facility] AT [address] [place] [address] FINDINGS: On sagittal views the vertebral bodies have normal height and alignment.  Posterior lumbar decompression and fusion with pedicle screws and interbody fusion device spanning L5 and S1 levels.  The conus medullaris terminates at the level of L1.  On axial views: T12-L1: No spinal stenosis or foraminal narrowing L1-2: Disc bulging and facet hypertrophy with mild bilateral foraminal stenosis L2-3: Disc bulging and facet hypertrophy with mild bilateral foraminal stenosis L3-4: Disc bulging and facet hypertrophy with mild bilateral foraminal stenosis L4-5: Disc bulging and facet operatory with moderate to severe bilateral foraminal stenosis L5-S1: Disc bulging and posterior decompression, with moderate bilateral foraminal stenosis Limited views of the aorta, kidneys, iliopsoas muscles and sacroiliac joints are notable for bilateral renal cysts ranging from 0.6 to 3.0 cm in diameter.   MRI lumbar spine (without) demonstrating: - At L4-5: Disc bulging and facet hypertrophy with moderate to severe bilateral foraminal stenosis. - At L5-S1: Disc bulging and posterior decompression, with moderate bilateral foraminal stenosis; posterior lumbar decompression and fusion with metal hardware. - Bilateral renal cysts. INTERPRETING PHYSICIAN: Omega Bible, MD Certified in Neurology, Neurophysiology and Neuroimaging [facility] [address] [place], [place] [address] [phone]  MR CERVICAL SPINE WO CONTRAST Result Date: 05/14/2023 Table formatting from the original result was not included. [facility] NEUROIMAGING REPORT STUDY DATE: 05/13/23 PATIENT NAME: AKSHATH MCCAREY DOB: [birth year] MRN: [id number] ORDERING CLINICIAN: Phebe Brasil, MD CLINICAL HISTORY: 78 y.o. year old male with: 1. Cerebrovascular accident (CVA), unspecified mechanism (HCC)  2. Gait abnormality  3. Urinary incontinence, unspecified type   EXAM: MR CERVICAL SPINE WO CONTRAST TECHNIQUE: MRI of the cervical spine was obtained utilizing multiplanar, multiecho pulse sequences. CONTRAST: Diagnostic Product Medications (last 72 hours)   None   COMPARISON: 02/26/23 CT IMAGING SITE: [facility] AT [address] [place] [address] FINDINGS: On sagittal views the vertebral bodies have normal height and alignment.  The spinal cord is normal in size and appearance. The posterior fossa, pituitary gland and paraspinal soft tissues are unremarkable.  On axial views: C2-3 no spinal stenosis or foraminal narrowing C3-4 no spinal stenosis or foraminal narrowing C4-5 small posterior disc protrusion and facet hypertrophy with mild spinal stenosis and no foraminal narrowing C5-6 disc bulging and facet hypertrophy with moderate spinal stenosis and severe bilateral foraminal stenosis C6-7 disc bulging and facet hypertrophy with moderate spinal stenosis and mild bilateral foraminal stenosis C7-T1 no spinal stenosis or foraminal narrowing T1-2 no spinal stenosis or foraminal narrowing Limited views of the soft tissues of the head and neck are unremarkable.   MRI cervical spine without contrast demonstrating: - At C5-6 disc bulging and facet hypertrophy with moderate spinal stenosis and severe bilateral foraminal stenosis. - At C6-7 disc bulging and facet hypertrophy with moderate spinal stenosis and mild bilateral foraminal stenosis. - No cord signal  abnormalities. INTERPRETING PHYSICIAN: Omega Bible, MD Certified in Neurology, Neurophysiology and Neuroimaging [facility] [address], [address] [place], [place] [address] [phone]  CT Hip Right Wo Contrast Result Date: 05/14/2023 CLINICAL DATA:  Right hip pain after fall. Concern for periprosthetic fracture. EXAM: CT OF THE RIGHT HIP WITHOUT CONTRAST TECHNIQUE: Multidetector CT imaging of the right hip was performed according to the standard protocol. Multiplanar CT image reconstructions were also generated. RADIATION DOSE REDUCTION: This exam was performed according to the departmental dose-optimization program  which includes automated exposure control, adjustment of the mA and/or kV according to patient size and/or use of iterative reconstruction technique. COMPARISON:  Right hip radiographs dated 05/14/2023 at 11:23 a.m. FINDINGS: Bones/Joint/Cartilage Status post right hip arthroplasty with associated streak artifact which limits detailed evaluation of the surrounding bone and soft tissues. There is a fracture extending along the posterolateral proximal femoral metadiaphysis with approximately 6 mm of posterior displacement at the distal medial margin (series 3, images 89-96 and series 7, images 42-47). The femoral and acetabular prosthetic components are intact with appropriate alignment. Thin curvilinear mineralization adjacent to the posterior aspect of the posterior facet of the right greater trochanter, concerning for avulsion fracture. (Series 3, image 56 and series 7, image 43). The right sacroiliac joints and pubic symphysis are intact. Partially visualized lumbosacral fixation hardware. Ligaments Ligaments are suboptimally evaluated by CT. Soft tissue and Muscles Soft tissue swelling overlying the right lateral hip. No discrete intramuscular collection identified. Other: Air within the nondependent aspect of the distended bladder. Atherosclerotic vascular  calcification. IMPRESSION: 1. Status post right hip arthroplasty with associated streak artifact which limits detailed evaluation of the surrounding bone and soft tissues. There is a mildly displaced periprosthetic fracture of the posterior proximal femoral metadiaphysis. The femoral and acetabular prosthetic components are intact with appropriate alignment. 2. Thin curvilinear mineralization adjacent to the posterior aspect of the posterior facet of the right greater trochanter, concerning for avulsion fracture. 3. Air within the nondependent aspect of the distended bladder may be secondary to recent instrumentation or infection. Clinical correlation is recommended. Electronically Signed   By: Mannie Seek M.D.   On: 05/14/2023 15:07   DG Chest 1 View Result Date: 05/14/2023 CLINICAL DATA:  Chest pain EXAM: CHEST  1 VIEW COMPARISON:  March 10, 2023 FINDINGS: The heart size and mediastinal contours are within normal limits. Both lungs are clear. The visualized skeletal structures are unremarkable. Left IJ Infuse-A-Port catheter tip in the SVC No change in the chronic right apical pleural thickening. IMPRESSION: No acute cardiopulmonary process. Electronically Signed   By: Fredrich Jefferson M.D.   On: 05/14/2023 11:52   DG Hip Unilat W or Wo Pelvis 2-3 Views Right Result Date: 05/14/2023 CLINICAL DATA:  Status post fall and pain EXAM: DG HIP (WITH OR WITHOUT PELVIS) 2-3V RIGHT COMPARISON:  None Available. FINDINGS: There is no evidence of hip fracture or dislocation. There is no evidence of arthropathy or other focal bone abnormality. IMPRESSION: Total right hip arthroplasty., near anatomic alignment. No fractures Electronically Signed   By: Fredrich Jefferson M.D.   On: 05/14/2023 11:51   CT Head Wo Contrast Result Date: 05/14/2023 CLINICAL DATA:  Head trauma. EXAM: CT HEAD WITHOUT CONTRAST TECHNIQUE: Contiguous axial images were obtained from the base of the skull through the vertex without intravenous  contrast. RADIATION DOSE REDUCTION: This exam was performed according to the departmental dose-optimization program which includes automated exposure control, adjustment of the mA and/or kV according to patient size and/or use of iterative reconstruction technique. COMPARISON:  Head CT dated 03/10/2023 FINDINGS: Brain: Moderate age-related atrophy and chronic microvascular ischemic changes. Right basal ganglia old infarct as well as an old lacunar infarct versus a cyst in the left lentiform nucleus similar to prior CT. There is no acute intracranial hemorrhage. No mass effect or midline shift. No extra-axial fluid collection. Vascular: No hyperdense vessel or unexpected calcification. Skull: Normal. Negative for fracture or focal lesion. Sinuses/Orbits: The visualized paranasal sinuses are clear. Bilateral mastoid effusions. Other: None IMPRESSION:  1. No acute intracranial pathology. 2. Moderate age-related atrophy and chronic microvascular ischemic changes. Electronically Signed   By: Angus Bark M.D.   On: 05/14/2023 11:37    Positive ROS: All other systems have been reviewed and were otherwise negative with the exception of those mentioned in the HPI and as above.  Labs cbc Recent Labs    05/14/23 1203 05/15/23 0521  WBC 12.4* 7.8  HGB 11.5* 10.5*  HCT 34.8* 31.6*  PLT 159 142*    Labs inflam No results for input(s): "CRP" in the last 72 hours.  Invalid input(s): "ESR"  Labs coag No results for input(s): "INR", "PTT" in the last 72 hours.  Invalid input(s): "PT"  Recent Labs    05/14/23 1203 05/15/23 0521  NA 134* 137  K 3.5 3.7  CL 99 104  CO2 23 24  GLUCOSE 127* 111*  BUN 21 19  CREATININE 1.43* 0.99  CALCIUM  9.1 8.8*    Physical Exam: Vitals:   05/15/23 1039 05/15/23 1112  BP:  132/89  Pulse:  72  Resp:  18  Temp:  97.8 F (36.6 C)  SpO2: 99% 96%   General: Alert, no acute distress Cardiovascular: No pedal edema Respiratory: No cyanosis, no use of  accessory musculature GI: No organomegaly, abdomen is soft and non-tender Skin: No lesions in the area of chief complaint other than those listed below in MSK exam.  Neurologic: Sensation intact distally save for the below mentioned MSK exam Psychiatric: Patient is competent for consent with normal mood and affect Lymphatic: No axillary or cervical lymphadenopathy  MUSCULOSKELETAL:  Right lower extremity neuro vas intact distally pain with any range of motion of the hip compartments soft Other extremities are atraumatic with painless ROM and NVI.  Assessment: Right periprosthetic hip fracture  Plan: Removal of hardware ORIF of intertrochanteric June of the frame femur with cerclage wires placement of diaphyseal fit hemiarthroplasty stem  I discussed the risks and benefits of the above-mentioned surgery with patient and his family, they all wish to proceed with the above surgery     Saundra Curl, MD    05/15/2023 1:39 PM

## 2023-05-15 NOTE — Anesthesia Procedure Notes (Signed)
 Procedure Name: Intubation Date/Time: 05/15/2023 2:02 PM  Performed by: Vella Gey, CRNAPre-anesthesia Checklist: Patient identified, Emergency Drugs available, Suction available and Patient being monitored Patient Re-evaluated:Patient Re-evaluated prior to induction Oxygen Delivery Method: Circle system utilized Preoxygenation: Pre-oxygenation with 100% oxygen Induction Type: IV induction Ventilation: Mask ventilation without difficulty and Oral airway inserted - appropriate to patient size Laryngoscope Size: Annabell Key and 3 Grade View: Grade I Tube type: Oral Tube size: 7.5 mm Number of attempts: 1 Airway Equipment and Method: Stylet and Oral airway Placement Confirmation: ETT inserted through vocal cords under direct vision, positive ETCO2 and breath sounds checked- equal and bilateral Secured at: 22 cm Tube secured with: Tape Dental Injury: Teeth and Oropharynx as per pre-operative assessment

## 2023-05-15 NOTE — Progress Notes (Signed)
 Initial Nutrition Assessment  DOCUMENTATION CODES:   Non-severe (moderate) malnutrition in context of chronic illness  INTERVENTION:   -Ensure Plus High Protein po TID, each supplement provides 350 kcal and 20 grams of protein.   -Multivitamin with minerals daily  NUTRITION DIAGNOSIS:   Moderate Malnutrition related to chronic illness, cancer and cancer related treatments as evidenced by mild fat depletion, severe muscle depletion.  GOAL:   Patient will meet greater than or equal to 90% of their needs  MONITOR:   PO intake, Supplement acceptance  REASON FOR ASSESSMENT:   Consult Hip fracture protocol  ASSESSMENT:   78 yrs old male, with past medical history significant of congestive heart failure, peripheral vascular disease, CAD, hypertension, paroxysmal A-fib on Eliquis , primary adenocarcinoma of the right lung, following with Dr. 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  Patient in room, partner at bedside. Pt reports good appetite and was eating 2 meals with snacks at home. No issue with swallowing or chewing. States he used protein supplements when he was under cancer treatments, willing to drink them following surgery today. Currently NPO for hip surgery today.   Pt reports UBW ~180 lbs.  Per weight records, pt has lost 16 lbs since 1/3 ( 8% wt loss  x 3.5 months, insignificant for time frame).   Medications: Vitamin D , Folic acid , MAG-OX, Senokot  Labs reviewed.  NUTRITION - FOCUSED PHYSICAL EXAM:  Flowsheet Row Most Recent Value  Orbital Region Mild depletion  Upper Arm Region Mild depletion  Thoracic and Lumbar Region Unable to assess  Buccal Region Moderate depletion  Temple Region Moderate depletion  Clavicle Bone Region Severe depletion  Clavicle and Acromion Bone Region Severe depletion  Scapular Bone Region Severe depletion  Dorsal Hand Unable to assess  Patellar Region Unable to assess   Anterior Thigh Region Unable to assess  Posterior Calf Region Unable to assess  Edema (RD Assessment) None  Hair Reviewed  Eyes Reviewed  Mouth Reviewed  Skin Reviewed  Nails Reviewed       Diet Order:   Diet Order             Diet NPO time specified  Diet effective midnight                   EDUCATION NEEDS:   Education needs have been addressed  Skin:  Skin Assessment: Reviewed RN Assessment  Last BM:  4/22  Height:   Ht Readings from Last 1 Encounters:  05/14/23 5\' 10"  (1.778 m)    Weight:   Wt Readings from Last 1 Encounters:  05/14/23 78.9 kg    BMI:  Body mass index is 24.97 kg/m.  Estimated Nutritional Needs:   Kcal:  2000-2200  Protein:  110-120g  Fluid:  2.2L/day   Arna Better, MS, RD, LDN Inpatient Clinical Dietitian Contact via Secure chat

## 2023-05-15 NOTE — Progress Notes (Signed)
 PT Cancellation Note  Patient Details Name: Darryl Ward MRN: [id number] DOB: August 29, 1945   Cancelled Treatment:    Reason Eval/Treat Not Completed: Medical issues which prohibited therapy  Surgery  planned today.  Abelina Hoes PT Acute Rehabilitation Services Office [phone] Weekend pager-[phone]  Dareen Ebbing 05/15/2023, 6:56 AM

## 2023-05-15 NOTE — Progress Notes (Signed)
 Received report from off going RN. Agree with previous assessment. Pt resting comfortably in bed with no pain at the moment.

## 2023-05-15 NOTE — Anesthesia Preprocedure Evaluation (Addendum)
 Anesthesia Evaluation  Patient identified by MRN, date of birth, ID band Patient awake    Reviewed: Allergy & Precautions, NPO status , Patient's Chart, lab work & pertinent test results  Airway Mallampati: II  TM Distance: >3 FB Neck ROM: Full    Dental  (+) Edentulous Upper, Edentulous Lower   Pulmonary COPD, former smoker   Pulmonary exam normal        Cardiovascular hypertension, Pt. on medications and Pt. on home beta blockers + CAD, + Past MI, + Peripheral Vascular Disease and +CHF  + dysrhythmias Atrial Fibrillation  Rhythm:Irregular Rate:Normal  02/24 ECHO:  1. Left ventricular ejection fraction, by estimation, is approximately  55%. The left ventricle has normal function. The left ventricle  demonstrates regional wall motion abnormalities (see scoring  diagram/findings for description). Left ventricular  diastolic parameters are consistent with Grade II diastolic dysfunction  (pseudonormalization).   2. Right ventricular systolic function is normal. The right ventricular  size is normal. There is normal pulmonary artery systolic pressure. The  estimated right ventricular systolic pressure is 35.0 mmHg.   3. Left atrial size was mildly dilated.   4. The mitral valve is grossly normal. Mild mitral valve regurgitation.   5. The aortic valve is tricuspid. Aortic valve regurgitation is not  visualized.   6. Aortic dilatation noted. 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   7. The inferior vena cava is dilated in size with >50% respiratory  variability, suggesting right atrial pressure of 8 mmHg.     Neuro/Psych    Depression    CVA    GI/Hepatic Neg liver ROS,GERD  Medicated,,  Endo/Other  negative endocrine ROS    Renal/GU   negative genitourinary   Musculoskeletal  (+) Arthritis , Osteoarthritis,    Abdominal Normal abdominal exam  (+)   Peds  Hematology  (+) Blood dyscrasia,  anemia Lab Results      Component                Value               Date                      WBC                      7.8                 05/15/2023                HGB                      10.5 (L)            05/15/2023                HCT                      31.6 (L)            05/15/2023                MCV                      95.5                05/15/2023                PLT  142 (L)             05/15/2023             Lab Results      Component                Value               Date                      NA                       137                 05/15/2023                K                        3.7                 05/15/2023                CO2                      24                  05/15/2023                GLUCOSE                  111 (H)             05/15/2023                BUN                      19                  05/15/2023                CREATININE               0.99                05/15/2023                CALCIUM                   8.8 (L)             05/15/2023                EGFR                     45 (L)              04/25/2023                GFRNONAA                 >60                 05/15/2023              Anesthesia Other Findings Eliquis  05/13/23  Reproductive/Obstetrics                             Anesthesia Physical Anesthesia Plan  ASA: 3  Anesthesia Plan:  General   Post-op Pain Management:    Induction: Intravenous  PONV Risk Score and Plan: 2 and Ondansetron , Dexamethasone  and Treatment may vary due to age or medical condition  Airway Management Planned: Mask and Oral ETT  Additional Equipment: None  Intra-op Plan:   Post-operative Plan: Extubation in OR  Informed Consent: I have reviewed the patients History and Physical, chart, labs and discussed the procedure including the risks, benefits and alternatives for the proposed anesthesia with the patient or authorized representative who has  indicated his/her understanding and acceptance.     Dental advisory given  Plan Discussed with: CRNA  Anesthesia Plan Comments:        Anesthesia Quick Evaluation

## 2023-05-15 NOTE — Addendum Note (Signed)
 Addendum  created 05/15/23 2159 by Lethaniel Rave, MD   Attestation recorded in Intraprocedure, Intraprocedure Attestations filed

## 2023-05-15 NOTE — Consult Note (Signed)
 ORTHOPAEDIC CONSULTATION  REQUESTING PHYSICIAN: Magdalene School, MD  Time called na Time arrived na  Chief Complaint: Right periprosthetic hip fracture  HPI: Darryl Ward is a 78 y.o. male who complains of a fall out of bed onto the right side had immediate pain and inability to move or weight-bear on the leg.  He was seen in [facility] emergency room imaging confirms right periprosthetic femur fracture with unstable stem  Past Medical History:  Diagnosis Date   AAA (abdominal aortic aneurysm) (HCC)    Arthritis    Broken hip (HCC)    Cancer (HCC)    Carotid artery disease (HCC)    Nonobstructive   Cataract    COPD (chronic obstructive pulmonary disease) (HCC)    Coronary atherosclerosis of native coronary artery    PTCA small diagonal 2007 otherwise nonobstructive CAD   Depression    Dysrhythmia    Essential hypertension, benign    Hyperlipidemia    NSTEMI (non-ST elevated myocardial infarction) (HCC) 2007   Stroke [facility]) 2004   Stroke [facility]) 2025   TIA (transient ischemic attack) 2006   Past Surgical History:  Procedure Laterality Date   AORTA - BILATERAL FEMORAL ARTERY BYPASS GRAFT  01/08/2012   Procedure: AORTA BIFEMORAL BYPASS GRAFT;  Surgeon: Richrd Char, MD;  Location: [facility] OR;  Service: Vascular;  Laterality: Bilateral;  using 18x33mm x 40cm Hemashield Gold Vascular Graft with Endarterectomy, Thombectomy and  Reimplantation of Inferior Mesenteric Artery   BACK SURGERY  2021   BRONCHIAL BIOPSY  06/11/2021   Procedure: BRONCHIAL BIOPSIES;  Surgeon: Denson Flake, MD;  Location: [facility] ENDOSCOPY;  Service: Pulmonary;;   BRONCHIAL BRUSHINGS  06/11/2021   Procedure: BRONCHIAL BRUSHINGS;  Surgeon: Denson Flake, MD;  Location: [facility] ENDOSCOPY;  Service: Pulmonary;;   BRONCHIAL NEEDLE ASPIRATION BIOPSY  06/11/2021   Procedure: BRONCHIAL NEEDLE ASPIRATION BIOPSIES;  Surgeon: Denson Flake, MD;  Location: [facility] ENDOSCOPY;  Service: Pulmonary;;   CARDIOVERSION N/A  09/20/2021   Procedure: CARDIOVERSION;  Surgeon: Gerard Knight, MD;  Location: [facility] ORS;  Service: Cardiovascular;  Laterality: N/A;   COLONOSCOPY N/A 04/14/2019   Procedure: COLONOSCOPY;  Surgeon: Suzette Espy, MD;  Location: [facility] ENDO SUITE;  Service: Endoscopy;  Laterality: N/A;  9:30   COLONOSCOPY WITH PROPOFOL  N/A 07/25/2021   Procedure: COLONOSCOPY WITH PROPOFOL ;  Surgeon: Vinetta Greening, DO;  Location: [facility] ENDO SUITE;  Service: Endoscopy;  Laterality: N/A;  1:00pm   FIDUCIAL MARKER PLACEMENT  06/11/2021   Procedure: FIDUCIAL MARKER PLACEMENT;  Surgeon: Denson Flake, MD;  Location: [facility] ENDOSCOPY;  Service: Pulmonary;;   HIP ARTHROPLASTY Right 04/28/2022   Procedure: ARTHROPLASTY BIPOLAR HIP (HEMIARTHROPLASTY);  Surgeon: Saundra Curl, MD;  Location: [facility] OR;  Service: Orthopedics;  Laterality: Right;   INTERCOSTAL NERVE BLOCK Right 11/12/2021   Procedure: INTERCOSTAL NERVE BLOCK;  Surgeon: Zelphia Higashi, MD;  Location: [facility] OR;  Service: Thoracic;  Laterality: Right;   IR IMAGING GUIDED PORT INSERTION  07/31/2021   Left cataract surgery     LYMPH NODE DISSECTION Right 11/12/2021   Procedure: LYMPH NODE DISSECTION;  Surgeon: Zelphia Higashi, MD;  Location: [facility] OR;  Service: Thoracic;  Laterality: Right;   POLYPECTOMY  04/14/2019   Procedure: POLYPECTOMY;  Surgeon: Suzette Espy, MD;  [facility] ENDO [facility];  Service: Endoscopy;;   POLYPECTOMY  07/25/2021   Procedure: POLYPECTOMY;  Surgeon: Vinetta Greening, DO;  Location[facility] ENDO SUITE;  Service: Endoscopy;;   TEE  WITHOUT CARDIOVERSION N/A 09/20/2021   Procedure: TRANSESOPHAGEAL ECHOCARDIOGRAM (TEE);  Surgeon: Gerard Knight, MD;  Location: [facility] ORS;  Service: Cardiovascular;  Laterality: N/A;   TRANSFORAMINAL LUMBAR INTERBODY FUSION (TLIF) WITH PEDICLE SCREW FIXATION 1 LEVEL N/A 04/27/2020   Procedure: Transforaminal Lumbar Interbody Fusion Lumbar Five-Sacral One;  Surgeon: Van Gelinas, MD;  Location: [facility]  OR;  Service: Neurosurgery;  Laterality: N/A;   VIDEO BRONCHOSCOPY WITH INSERTION OF INTERBRONCHIAL VALVE (IBV) N/A 11/22/2021   Procedure: VIDEO BRONCHOSCOPY WITH INSERTION OF INTERBRONCHIAL VALVE (IBV);  Surgeon: Zelphia Higashi, MD;  Location: [facility] OR;  Service: Thoracic;  Laterality: N/A;   VIDEO BRONCHOSCOPY WITH INSERTION OF INTERBRONCHIAL VALVE (IBV) N/A 01/24/2022   Procedure: VIDEO BRONCHOSCOPY WITH REMOVAL OF INTERBRONCHIAL VALVE (IBV);  Surgeon: Zelphia Higashi, MD;  Location: [facility] OR;  Service: Thoracic;  Laterality: N/A;   VIDEO BRONCHOSCOPY WITH RADIAL ENDOBRONCHIAL ULTRASOUND  06/11/2021   Procedure: VIDEO BRONCHOSCOPY WITH RADIAL ENDOBRONCHIAL ULTRASOUND;  Surgeon: Denson Flake, MD;  Location: [facility] ENDOSCOPY;  Service: Pulmonary;;   Social History   Socioeconomic History   Marital status: Divorced    Spouse name: Not on file   Number of children: 1   Years of education: 11   Highest education level: 11th grade  Occupational History    Employer: Engineer, materials  Tobacco Use   Smoking status: Former    Current packs/day: 0.00    Average packs/day: 1 pack/day for 40.0 years (40.0 ttl pk-yrs)    Types: Cigarettes    Start date: 07/1981    Quit date: 07/2021    Years since quitting: 1.8   Smokeless tobacco: Never   Tobacco comments:    1 pack of cigarettes smoked daily. 07/17/21 ARJ, RN   Vaping Use   Vaping status: Never Used  Substance and Sexual Activity   Alcohol use: No    Comment: Prior history of regular alcohol use   Drug use: No   Sexual activity: Not Currently  Other Topics Concern   Not on file  Social History Narrative   Not on file   Social Drivers of Health   Financial Resource Strain: High Risk (08/30/2022)   Overall Financial Resource Strain (CARDIA)    Difficulty of Paying Living Expenses: Hard  Food Insecurity: No Food Insecurity (05/14/2023)   Hunger Vital Sign    Worried About Running Out of Food in the Last Year: Never true    Ran  Out of Food in the Last Year: Never true  Transportation Needs: No Transportation Needs (05/14/2023)   PRAPARE - Administrator, Civil Service (Medical): No    Lack of Transportation (Non-Medical): No  Physical Activity: Insufficiently Active (08/30/2022)   Exercise Vital Sign    Days of Exercise per Week: 7 days    Minutes of Exercise per Session: 20 min  Stress: No Stress Concern Present (08/30/2022)   [employer] of Occupational Health - Occupational Stress Questionnaire    Feeling of Stress : Not at all  Social Connections: Moderately Isolated (05/14/2023)   Social Connection and Isolation Panel [NHANES]    Frequency of Communication with Friends and Family: Twice a week    Frequency of Social Gatherings with Friends and Family: Three times a week    Attends Religious Services: Never    Active Member of Clubs or Organizations: No    Attends Banker Meetings: Never    Marital Status: Married   Family History  Problem Relation Age of Onset  Hyperlipidemia Sister    Heart attack Brother 85   Cancer - Colon Neg Hx    No Known Allergies Prior to Admission medications   Medication Sig Start Date End Date Taking? Authorizing Provider  albuterol  (VENTOLIN  HFA) 108 (90 Base) MCG/ACT inhaler Inhale 2 puffs into the lungs every 6 (six) hours as needed for wheezing or shortness of breath. 01/23/22  Yes Denson Flake, MD  ALPRAZolam  (XANAX ) 0.5 MG tablet Take 1-2 tablets 30 minutes prior to MRI, may repeat once as needed. Must have driver. 03/31/23  Yes Phebe Brasil, MD  apixaban  (ELIQUIS ) 5 MG TABS tablet Take 1 tablet (5 mg total) by mouth 2 (two) times daily. 06/19/22  Yes Cook, Jayce G, DO  atorvastatin  (LIPITOR ) 80 MG tablet Take 1 tablet (80 mg total) by mouth daily. 06/19/22  Yes Cook, Jayce G, DO  chlorthalidone  (HYGROTON ) 25 MG tablet Take 1 tablet (25 mg total) by mouth daily. 03/15/23  Yes Shah, Pratik D, DO  Cholecalciferol  50 MCG (2000 UT) CAPS Take 1 capsule  by mouth daily.   Yes [provider]  cyclobenzaprine  (FLEXERIL ) 10 MG tablet Take 1 tablet (10 mg total) by mouth 2 (two) times daily as needed. Patient taking differently: Take 10 mg by mouth 2 (two) times daily as needed for muscle spasms. 03/14/23  Yes Shah, Pratik D, DO  escitalopram  (LEXAPRO ) 10 MG tablet Take 1 tablet (10 mg total) by mouth daily. 06/19/22  Yes Cook, Jayce G, DO  folic acid  (KP FOLIC ACID ) 1 MG tablet Take 1 tablet (1 mg total) by mouth daily. 06/19/22  Yes Cook, Jayce G, DO  furosemide  (LASIX ) 20 MG tablet Take 1 tablet (20 mg total) by mouth daily as needed. Patient taking differently: Take 20 mg by mouth daily as needed for fluid or edema. 04/21/23  Yes Cook, Jayce G, DO  HYDROcodone -acetaminophen  (NORCO/VICODIN) 5-325 MG tablet Take 1 tablet by mouth every 8 (eight) hours as needed for severe pain (pain score 7-10). 04/25/23  Yes Cook, Jayce G, DO  magnesium  oxide (MAG-OX) 400 (240 Mg) MG tablet Take 1 tablet (400 mg total) by mouth daily. 06/19/22  Yes Cook, Jayce G, DO  meclizine  (ANTIVERT ) 25 MG tablet Take 1 tablet (25 mg total) by mouth 3 (three) times daily as needed for dizziness. 03/14/23  Yes Mason Sole, Pratik D, DO  metoprolol  succinate (TOPROL -XL) 25 MG 24 hr tablet Take 1 tablet (25 mg total) by mouth daily. 02/04/23  Yes Cook, Jayce G, DO  pantoprazole  (PROTONIX ) 40 MG tablet Take 1 tablet (40 mg total) by mouth daily. 06/19/22  Yes Cook, Jayce G, DO  tamsulosin  (FLOMAX ) 0.4 MG CAPS capsule Take 1 capsule (0.4 mg total) by mouth daily. 06/12/22  Yes Cook, Jayce G, DO  umeclidinium bromide  (INCRUSE ELLIPTA ) 62.5 MCG/ACT AEPB Inhale 1 puff into the lungs daily.   Yes [provider]   MR LUMBAR SPINE WO CONTRAST Result Date: 05/14/2023 Table formatting from the original result was not included. [facility] NEUROIMAGING REPORT STUDY DATE: 05/13/23 PATIENT NAME: Darryl Ward DOB: 12-Dec-1945 MRN: [id number] ORDERING CLINICIAN: Phebe Brasil, MD  CLINICAL HISTORY: 78 y.o. year old male with: 1. Cerebrovascular accident (CVA), unspecified mechanism (HCC)  2. Gait abnormality  3. Urinary incontinence, unspecified type   EXAM: MR LUMBAR SPINE WO CONTRAST TECHNIQUE: MRI of the lumbar spine was obtained utilizing multiplanar, multiecho pulse sequences. CONTRAST: Diagnostic Product Medications (last 72 hours)   None   COMPARISON: none IMAGING SITE: [facility] AT [address] [place] [address] FINDINGS: On sagittal views the vertebral bodies have normal height and alignment.  Posterior lumbar decompression and fusion with pedicle screws and interbody fusion device spanning L5 and S1 levels.  The conus medullaris terminates at the level of L1.  On axial views: T12-L1: No spinal stenosis or foraminal narrowing L1-2: Disc bulging and facet hypertrophy with mild bilateral foraminal stenosis L2-3: Disc bulging and facet hypertrophy with mild bilateral foraminal stenosis L3-4: Disc bulging and facet hypertrophy with mild bilateral foraminal stenosis L4-5: Disc bulging and facet operatory with moderate to severe bilateral foraminal stenosis L5-S1: Disc bulging and posterior decompression, with moderate bilateral foraminal stenosis Limited views of the aorta, kidneys, iliopsoas muscles and sacroiliac joints are notable for bilateral renal cysts ranging from 0.6 to 3.0 cm in diameter.   MRI lumbar spine (without) demonstrating: - At L4-5: Disc bulging and facet hypertrophy with moderate to severe bilateral foraminal stenosis. - At L5-S1: Disc bulging and posterior decompression, with moderate bilateral foraminal stenosis; posterior lumbar decompression and fusion with metal hardware. - Bilateral renal cysts. INTERPRETING PHYSICIAN: Omega Bible, MD Certified in Neurology, Neurophysiology and Neuroimaging [facility] [address] [place], [place] [address] [phone]  MR CERVICAL SPINE WO CONTRAST Result Date: 05/14/2023 Table formatting from the original result was not included. [facility] NEUROIMAGING REPORT STUDY DATE: 05/13/23 PATIENT NAME: AKSHATH MCCAREY DOB: [birth year] MRN: [id number] ORDERING CLINICIAN: Phebe Brasil, MD CLINICAL HISTORY: 78 y.o. year old male with: 1. Cerebrovascular accident (CVA), unspecified mechanism (HCC)  2. Gait abnormality  3. Urinary incontinence, unspecified type   EXAM: MR CERVICAL SPINE WO CONTRAST TECHNIQUE: MRI of the cervical spine was obtained utilizing multiplanar, multiecho pulse sequences. CONTRAST: Diagnostic Product Medications (last 72 hours)   None   COMPARISON: 02/26/23 CT IMAGING SITE: [facility] AT [address] [place] [address] FINDINGS: On sagittal views the vertebral bodies have normal height and alignment.  The spinal cord is normal in size and appearance. The posterior fossa, pituitary gland and paraspinal soft tissues are unremarkable.  On axial views: C2-3 no spinal stenosis or foraminal narrowing C3-4 no spinal stenosis or foraminal narrowing C4-5 small posterior disc protrusion and facet hypertrophy with mild spinal stenosis and no foraminal narrowing C5-6 disc bulging and facet hypertrophy with moderate spinal stenosis and severe bilateral foraminal stenosis C6-7 disc bulging and facet hypertrophy with moderate spinal stenosis and mild bilateral foraminal stenosis C7-T1 no spinal stenosis or foraminal narrowing T1-2 no spinal stenosis or foraminal narrowing Limited views of the soft tissues of the head and neck are unremarkable.   MRI cervical spine without contrast demonstrating: - At C5-6 disc bulging and facet hypertrophy with moderate spinal stenosis and severe bilateral foraminal stenosis. - At C6-7 disc bulging and facet hypertrophy with moderate spinal stenosis and mild bilateral foraminal stenosis. - No cord signal  abnormalities. INTERPRETING PHYSICIAN: Omega Bible, MD Certified in Neurology, Neurophysiology and Neuroimaging [facility] [address], [address] [place], [place] [address] [phone]  CT Hip Right Wo Contrast Result Date: 05/14/2023 CLINICAL DATA:  Right hip pain after fall. Concern for periprosthetic fracture. EXAM: CT OF THE RIGHT HIP WITHOUT CONTRAST TECHNIQUE: Multidetector CT imaging of the right hip was performed according to the standard protocol. Multiplanar CT image reconstructions were also generated. RADIATION DOSE REDUCTION: This exam was performed according to the departmental dose-optimization program  which includes automated exposure control, adjustment of the mA and/or kV according to patient size and/or use of iterative reconstruction technique. COMPARISON:  Right hip radiographs dated 05/14/2023 at 11:23 a.m. FINDINGS: Bones/Joint/Cartilage Status post right hip arthroplasty with associated streak artifact which limits detailed evaluation of the surrounding bone and soft tissues. There is a fracture extending along the posterolateral proximal femoral metadiaphysis with approximately 6 mm of posterior displacement at the distal medial margin (series 3, images 89-96 and series 7, images 42-47). The femoral and acetabular prosthetic components are intact with appropriate alignment. Thin curvilinear mineralization adjacent to the posterior aspect of the posterior facet of the right greater trochanter, concerning for avulsion fracture. (Series 3, image 56 and series 7, image 43). The right sacroiliac joints and pubic symphysis are intact. Partially visualized lumbosacral fixation hardware. Ligaments Ligaments are suboptimally evaluated by CT. Soft tissue and Muscles Soft tissue swelling overlying the right lateral hip. No discrete intramuscular collection identified. Other: Air within the nondependent aspect of the distended bladder. Atherosclerotic vascular  calcification. IMPRESSION: 1. Status post right hip arthroplasty with associated streak artifact which limits detailed evaluation of the surrounding bone and soft tissues. There is a mildly displaced periprosthetic fracture of the posterior proximal femoral metadiaphysis. The femoral and acetabular prosthetic components are intact with appropriate alignment. 2. Thin curvilinear mineralization adjacent to the posterior aspect of the posterior facet of the right greater trochanter, concerning for avulsion fracture. 3. Air within the nondependent aspect of the distended bladder may be secondary to recent instrumentation or infection. Clinical correlation is recommended. Electronically Signed   By: Mannie Seek M.D.   On: 05/14/2023 15:07   DG Chest 1 View Result Date: 05/14/2023 CLINICAL DATA:  Chest pain EXAM: CHEST  1 VIEW COMPARISON:  March 10, 2023 FINDINGS: The heart size and mediastinal contours are within normal limits. Both lungs are clear. The visualized skeletal structures are unremarkable. Left IJ Infuse-A-Port catheter tip in the SVC No change in the chronic right apical pleural thickening. IMPRESSION: No acute cardiopulmonary process. Electronically Signed   By: Fredrich Jefferson M.D.   On: 05/14/2023 11:52   DG Hip Unilat W or Wo Pelvis 2-3 Views Right Result Date: 05/14/2023 CLINICAL DATA:  Status post fall and pain EXAM: DG HIP (WITH OR WITHOUT PELVIS) 2-3V RIGHT COMPARISON:  None Available. FINDINGS: There is no evidence of hip fracture or dislocation. There is no evidence of arthropathy or other focal bone abnormality. IMPRESSION: Total right hip arthroplasty., near anatomic alignment. No fractures Electronically Signed   By: Fredrich Jefferson M.D.   On: 05/14/2023 11:51   CT Head Wo Contrast Result Date: 05/14/2023 CLINICAL DATA:  Head trauma. EXAM: CT HEAD WITHOUT CONTRAST TECHNIQUE: Contiguous axial images were obtained from the base of the skull through the vertex without intravenous  contrast. RADIATION DOSE REDUCTION: This exam was performed according to the departmental dose-optimization program which includes automated exposure control, adjustment of the mA and/or kV according to patient size and/or use of iterative reconstruction technique. COMPARISON:  Head CT dated 03/10/2023 FINDINGS: Brain: Moderate age-related atrophy and chronic microvascular ischemic changes. Right basal ganglia old infarct as well as an old lacunar infarct versus a cyst in the left lentiform nucleus similar to prior CT. There is no acute intracranial hemorrhage. No mass effect or midline shift. No extra-axial fluid collection. Vascular: No hyperdense vessel or unexpected calcification. Skull: Normal. Negative for fracture or focal lesion. Sinuses/Orbits: The visualized paranasal sinuses are clear. Bilateral mastoid effusions. Other: None IMPRESSION:  1. No acute intracranial pathology. 2. Moderate age-related atrophy and chronic microvascular ischemic changes. Electronically Signed   By: Angus Bark M.D.   On: 05/14/2023 11:37    Positive ROS: All other systems have been reviewed and were otherwise negative with the exception of those mentioned in the HPI and as above.  Labs cbc Recent Labs    05/14/23 1203 05/15/23 0521  WBC 12.4* 7.8  HGB 11.5* 10.5*  HCT 34.8* 31.6*  PLT 159 142*    Labs inflam No results for input(s): "CRP" in the last 72 hours.  Invalid input(s): "ESR"  Labs coag No results for input(s): "INR", "PTT" in the last 72 hours.  Invalid input(s): "PT"  Recent Labs    05/14/23 1203 05/15/23 0521  NA 134* 137  K 3.5 3.7  CL 99 104  CO2 23 24  GLUCOSE 127* 111*  BUN 21 19  CREATININE 1.43* 0.99  CALCIUM  9.1 8.8*    Physical Exam: Vitals:   05/15/23 1039 05/15/23 1112  BP:  132/89  Pulse:  72  Resp:  18  Temp:  97.8 F (36.6 C)  SpO2: 99% 96%   General: Alert, no acute distress Cardiovascular: No pedal edema Respiratory: No cyanosis, no use of  accessory musculature GI: No organomegaly, abdomen is soft and non-tender Skin: No lesions in the area of chief complaint other than those listed below in MSK exam.  Neurologic: Sensation intact distally save for the below mentioned MSK exam Psychiatric: Patient is competent for consent with normal mood and affect Lymphatic: No axillary or cervical lymphadenopathy  MUSCULOSKELETAL:  Right lower extremity neuro vas intact distally pain with any range of motion of the hip compartments soft Other extremities are atraumatic with painless ROM and NVI.  Assessment: Right periprosthetic hip fracture  Plan: Removal of hardware ORIF of intertrochanteric June of the frame femur with cerclage wires placement of diaphyseal fit hemiarthroplasty stem  I discussed the risks and benefits of the above-mentioned surgery with patient and his family, they all wish to proceed with the above surgery     Saundra Curl, MD    05/15/2023 1:39 PM

## 2023-05-15 NOTE — Progress Notes (Signed)
 PROGRESS NOTE    Darryl Ward  BJY:[id number] DOB: 1945-05-10 DOA: 05/14/2023 PCP: Cook, Jayce G, DO   Brief Narrative:  This 78 yrs old male, with past medical history significant of congestive heart failure, peripheral vascular disease, CAD, hypertension, paroxysmal A-fib on Eliquis , primary adenocarcinoma of the right lung, following with Dr. 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  Patient reports fall happened when he was rolling out of the bed,  sustained a right hip pain and was unable to stand up after the fall, he usually ambulates with a cane and a walker.  Family found him 2 hours later and called the EMS.  Patient does have history of right hip surgical repair last year,  He is on Eliquis ,  the last time he took Eliquis  was yesterday evening.  Workup in the ED CT right hip significant for periprosthetic fracture.  Patient is admitted for further evaluation orthopedics is consulted. Patient is scheduled to have right hip arthroplasty today.  Assessment & Plan:   Principal Problem:   Closed right hip fracture (HCC) Active Problems:   GERD without esophagitis   Paroxysmal atrial fibrillation (HCC)   Coronary atherosclerosis of native coronary artery   COPD (chronic obstructive pulmonary disease) (HCC)   Essential hypertension   Chronic combined systolic and diastolic CHF (congestive heart failure) (HCC)   Benign prostatic hyperplasia with lower urinary tract symptoms   Depression  Right hip fracture: Patient sustained a fall from the bed when he was rolling over,  Imaging significant for periprostatic right hip fracture. Orthopedic consulted.  Scheduled for surgical repair by Dr. Abigail Abler today Adequate pain control with pain medicines, antinausea medication. Holding Eliquis  in anticipation of surgery.   History of CVA: Hold Eliquis  in anticipation of surgery today, resume once cleared by orthopedic.   History of  GERD: Continue with PPI   Paroxysmal atrial fibrillation (HCC): Continue with Toprol -XL. HR controlled. Resume Eliquis  when deemed by orthopedic   CKD stage III A: Serum Creatinine at baseline.   History of lung cancer: Continue outpatient follow-up with Dr. Cheree Cords.   Depression: Continue Lexapro .   Benign prostatic hyperplasia with lower urinary tract symptoms: Continue treatment with Flomax . Bladder looks distended on CT abdomen, will check bladder scan and if has high-volume will insert Foley catheter.  UA consistent with UTI. Continue ceftriaxone ,  follow-up urine culture   Essential hypertension: Continue current antihypertensive agents   COPD (chronic obstructive pulmonary disease) (HCC): No active wheezing Continue bronchodilator management.   Tobacco abuse: Cessation counseling provided.    DVT prophylaxis: SCDs Code Status: Full code Family Communication: No Family at bedside Disposition Plan:    Status is: Inpatient Remains inpatient appropriate because: Admitted for right hip fracture scheduled for ORIF today     Consultants:  Orthopeadics  Procedures: Scheduled for hardware removal ORIF and revision hemiarthroplasty Antimicrobials:  Anti-infectives (From admission, onward)    Start     Dose/Rate Route Frequency Ordered Stop   05/15/23 0845  ceFAZolin  (ANCEF ) IVPB 2g/100 mL premix        2 g 200 mL/hr over 30 Minutes Intravenous On call to O.R. 05/15/23 0749 05/16/23 0559   05/15/23 0730  cefTRIAXone  (ROCEPHIN ) 1 g in sodium chloride  0.9 % 100 mL IVPB        1 g 200 mL/hr over 30 Minutes Intravenous Every 24 hours 05/15/23 0865        Subjective: Patient was seen and examined  at bedside.  Overnight events noted. Patient reports having pain when he moves,  at rest he has no pain.   Patient is scheduled to have ORIF today.  Objective: Vitals:   05/15/23 0210 05/15/23 0554 05/15/23 0805 05/15/23 1020  BP: 112/74 118/66 119/61 104/67   Pulse: 88 72 78 76  Resp: 16 18  17   Temp: 97.9 F (36.6 C) 98.1 F (36.7 C) 98.2 F (36.8 C) 98.2 F (36.8 C)  TempSrc: Oral Oral Oral Oral  SpO2: 97% 98% 99% 100%  Weight:      Height:        Intake/Output Summary (Last 24 hours) at 05/15/2023 1034 Last data filed at 05/15/2023 0835 Gross per 24 hour  Intake 120 ml  Output 850 ml  Net -730 ml   Filed Weights   05/14/23 1036  Weight: 78.9 kg    Examination:  General exam: Appears calm and comfortable, not in any acute distress. Respiratory system: Clear to auscultation. Respiratory effort normal.  RR 16 Cardiovascular system: S1 & S2 heard, RRR. No JVD, murmurs, rubs, gallops or clicks.  Gastrointestinal system: Abdomen is non distended, soft and non tender. Normal bowel sounds heard. Central nervous system: Alert and oriented x 3. No focal neurological deficits. Extremities: Right hip tenderness, or reduced movement. Skin: No rashes, lesions or ulcers Psychiatry: Judgement and insight appear normal. Mood & affect appropriate.     Data Reviewed: I have personally reviewed following labs and imaging studies  CBC: Recent Labs  Lab 05/14/23 1203 05/15/23 0521  WBC 12.4* 7.8  NEUTROABS 11.0*  --   HGB 11.5* 10.5*  HCT 34.8* 31.6*  MCV 94.6 95.5  PLT 159 142*   Basic Metabolic Panel: Recent Labs  Lab 05/14/23 1203 05/15/23 0521  NA 134* 137  K 3.5 3.7  CL 99 104  CO2 23 24  GLUCOSE 127* 111*  BUN 21 19  CREATININE 1.43* 0.99  CALCIUM  9.1 8.8*   GFR: Estimated Creatinine Clearance: 65.5 mL/min (by C-G formula based on SCr of 0.99 mg/dL). Liver Function Tests: No results for input(s): "AST", "ALT", "ALKPHOS", "BILITOT", "PROT", "ALBUMIN " in the last 168 hours. No results for input(s): "LIPASE", "AMYLASE" in the last 168 hours. No results for input(s): "AMMONIA" in the last 168 hours. Coagulation Profile: No results for input(s): "INR", "PROTIME" in the last 168 hours. Cardiac Enzymes: Recent Labs   Lab 05/14/23 1203  CKTOTAL 147   BNP (last 3 results) No results for input(s): "PROBNP" in the last 8760 hours. HbA1C: No results for input(s): "HGBA1C" in the last 72 hours. CBG: No results for input(s): "GLUCAP" in the last 168 hours. Lipid Profile: No results for input(s): "CHOL", "HDL", "LDLCALC", "TRIG", "CHOLHDL", "LDLDIRECT" in the last 72 hours. Thyroid  Function Tests: No results for input(s): "TSH", "T4TOTAL", "FREET4", "T3FREE", "THYROIDAB" in the last 72 hours. Anemia Panel: No results for input(s): "VITAMINB12", "FOLATE", "FERRITIN", "TIBC", "IRON", "RETICCTPCT" in the last 72 hours. Sepsis Labs: No results for input(s): "PROCALCITON", "LATICACIDVEN" in the last 168 hours.  Recent Results (from the past 240 hours)  MRSA Next Gen by PCR, Nasal     Status: None   Collection Time: 05/15/23  5:27 AM   Specimen: Nasal Mucosa; Nasal Swab  Result Value Ref Range Status   MRSA by PCR Next Gen NOT DETECTED NOT DETECTED Final    Comment: (NOTE) The GeneXpert MRSA Assay (FDA approved for NASAL specimens only), is one component of a comprehensive MRSA colonization surveillance program. It is not  intended to diagnose MRSA infection nor to guide or monitor treatment for MRSA infections. Test performance is not FDA approved in patients less than 38 years old. Performed at [facility], [address], [place], [place] [address]     Radiology Studies: MR LUMBAR SPINE WO CONTRAST Result Date: 05/14/2023 Table formatting from the original result was not included. [facility] NEUROIMAGING REPORT STUDY DATE: 05/13/23 PATIENT NAME: CLEDIS SOHN DOB: 11-02-1945 MRN: [id number] ORDERING CLINICIAN: Phebe Brasil, MD CLINICAL HISTORY: 77 y.o. year old male with: 1. Cerebrovascular accident (CVA), unspecified mechanism (HCC)  2. Gait abnormality  3. Urinary incontinence, unspecified type   EXAM: MR LUMBAR SPINE WO CONTRAST TECHNIQUE: MRI of the lumbar spine  was obtained utilizing multiplanar, multiecho pulse sequences. CONTRAST: Diagnostic Product Medications (last 72 hours)   None   COMPARISON: none IMAGING SITE: [facility] AT [address] [place] [address] FINDINGS: On sagittal views the vertebral bodies have normal height and alignment.  Posterior lumbar decompression and fusion with pedicle screws and interbody fusion device spanning L5 and S1 levels.  The conus medullaris terminates at the level of L1.  On axial views: T12-L1: No spinal stenosis or foraminal narrowing L1-2: Disc bulging and facet hypertrophy with mild bilateral foraminal stenosis L2-3: Disc bulging and facet hypertrophy with mild bilateral foraminal stenosis L3-4: Disc bulging and facet hypertrophy with mild bilateral foraminal stenosis L4-5: Disc bulging and facet operatory with moderate to severe bilateral foraminal stenosis L5-S1: Disc bulging and posterior decompression, with moderate bilateral foraminal stenosis Limited views of the aorta, kidneys, iliopsoas muscles and sacroiliac joints are notable for bilateral renal cysts ranging from 0.6 to 3.0 cm in diameter.   MRI lumbar spine (without) demonstrating: - At L4-5: Disc bulging and facet hypertrophy with moderate to severe bilateral foraminal stenosis. - At L5-S1: Disc bulging and posterior decompression, with moderate bilateral foraminal stenosis; posterior lumbar decompression and fusion with metal hardware. - Bilateral renal cysts. INTERPRETING PHYSICIAN: Omega Bible, MD Certified in Neurology, Neurophysiology and Neuroimaging [facility] [address] [place], [place] [address] [phone]  MR CERVICAL SPINE WO CONTRAST Result Date: 05/14/2023 Table formatting from the original result was not included. [facility] NEUROIMAGING REPORT STUDY DATE: 05/13/23 PATIENT NAME: AFFAN CALLOW DOB: 1945/06/06 MRN: [id number]  ORDERING CLINICIAN: Phebe Brasil, MD CLINICAL HISTORY: 78 y.o. year old male with: 1. Cerebrovascular accident (CVA), unspecified mechanism (HCC)  2. Gait abnormality  3. Urinary incontinence, unspecified type   EXAM: MR CERVICAL SPINE WO CONTRAST TECHNIQUE: MRI of the cervical spine was obtained utilizing multiplanar, multiecho pulse sequences. CONTRAST: Diagnostic Product Medications (last 72 hours)   None   COMPARISON: 02/26/23 CT IMAGING SITE: [facility] AT [address] [place] [address] FINDINGS: On sagittal views the vertebral bodies have normal height and alignment.  The spinal cord is normal in size and appearance. The posterior fossa, pituitary gland and paraspinal soft tissues are unremarkable.  On axial views: C2-3 no spinal stenosis or foraminal narrowing C3-4 no spinal stenosis or foraminal narrowing C4-5 small posterior disc protrusion and facet hypertrophy with mild spinal stenosis and no foraminal narrowing C5-6 disc bulging and facet hypertrophy with moderate spinal stenosis and severe bilateral foraminal stenosis C6-7 disc bulging and facet hypertrophy with moderate spinal stenosis and mild bilateral foraminal stenosis C7-T1 no spinal stenosis or foraminal narrowing T1-2 no spinal stenosis or foraminal narrowing  Limited views of the soft tissues of the head and neck are unremarkable.   MRI cervical spine without contrast demonstrating: - At C5-6 disc bulging and facet hypertrophy with moderate spinal stenosis and severe bilateral foraminal stenosis. - At C6-7 disc bulging and facet hypertrophy with moderate spinal stenosis and mild bilateral foraminal stenosis. - No cord signal abnormalities. INTERPRETING PHYSICIAN: Omega Bible, MD Certified in Neurology, Neurophysiology and Neuroimaging [facility] [address], [address] [place], [place] [address] [phone]  CT Hip Right Wo Contrast Result Date:  05/14/2023 CLINICAL DATA:  Right hip pain after fall. Concern for periprosthetic fracture. EXAM: CT OF THE RIGHT HIP WITHOUT CONTRAST TECHNIQUE: Multidetector CT imaging of the right hip was performed according to the standard protocol. Multiplanar CT image reconstructions were also generated. RADIATION DOSE REDUCTION: This exam was performed according to the departmental dose-optimization program which includes automated exposure control, adjustment of the mA and/or kV according to patient size and/or use of iterative reconstruction technique. COMPARISON:  Right hip radiographs dated 05/14/2023 at 11:23 a.m. FINDINGS: Bones/Joint/Cartilage Status post right hip arthroplasty with associated streak artifact which limits detailed evaluation of the surrounding bone and soft tissues. There is a fracture extending along the posterolateral proximal femoral metadiaphysis with approximately 6 mm of posterior displacement at the distal medial margin (series 3, images 89-96 and series 7, images 42-47). The femoral and acetabular prosthetic components are intact with appropriate alignment. Thin curvilinear mineralization adjacent to the posterior aspect of the posterior facet of the right greater trochanter, concerning for avulsion fracture. (Series 3, image 56 and series 7, image 43). The right sacroiliac joints and pubic symphysis are intact. Partially visualized lumbosacral fixation hardware. Ligaments Ligaments are suboptimally evaluated by CT. Soft tissue and Muscles Soft tissue swelling overlying the right lateral hip. No discrete intramuscular collection identified. Other: Air within the nondependent aspect of the distended bladder. Atherosclerotic vascular calcification. IMPRESSION: 1. Status post right hip arthroplasty with associated streak artifact which limits detailed evaluation of the surrounding bone and soft tissues. There is a mildly displaced periprosthetic fracture of the posterior proximal femoral  metadiaphysis. The femoral and acetabular prosthetic components are intact with appropriate alignment. 2. Thin curvilinear mineralization adjacent to the posterior aspect of the posterior facet of the right greater trochanter, concerning for avulsion fracture. 3. Air within the nondependent aspect of the distended bladder may be secondary to recent instrumentation or infection. Clinical correlation is recommended. Electronically Signed   By: Mannie Seek M.D.   On: 05/14/2023 15:07   DG Chest 1 View Result Date: 05/14/2023 CLINICAL DATA:  Chest pain EXAM: CHEST  1 VIEW COMPARISON:  March 10, 2023 FINDINGS: The heart size and mediastinal contours are within normal limits. Both lungs are clear. The visualized skeletal structures are unremarkable. Left IJ Infuse-A-Port catheter tip in the SVC No change in the chronic right apical pleural thickening. IMPRESSION: No acute cardiopulmonary process. Electronically Signed   By: Fredrich Jefferson M.D.   On: 05/14/2023 11:52   DG Hip Unilat W or Wo Pelvis 2-3 Views Right Result Date: 05/14/2023 CLINICAL DATA:  Status post fall and pain EXAM: DG HIP (WITH OR WITHOUT PELVIS) 2-3V RIGHT COMPARISON:  None Available. FINDINGS: There is no evidence of hip fracture or dislocation. There is no evidence of arthropathy or other focal bone abnormality. IMPRESSION: Total right hip arthroplasty., near anatomic alignment. No fractures Electronically Signed   By: Fredrich Jefferson M.D.   On: 05/14/2023 11:51   CT Head Wo Contrast  Result Date: 05/14/2023 CLINICAL DATA:  Head trauma. EXAM: CT HEAD WITHOUT CONTRAST TECHNIQUE: Contiguous axial images were obtained from the base of the skull through the vertex without intravenous contrast. RADIATION DOSE REDUCTION: This exam was performed according to the departmental dose-optimization program which includes automated exposure control, adjustment of the mA and/or kV according to patient size and/or use of iterative reconstruction  technique. COMPARISON:  Head CT dated 03/10/2023 FINDINGS: Brain: Moderate age-related atrophy and chronic microvascular ischemic changes. Right basal ganglia old infarct as well as an old lacunar infarct versus a cyst in the left lentiform nucleus similar to prior CT. There is no acute intracranial hemorrhage. No mass effect or midline shift. No extra-axial fluid collection. Vascular: No hyperdense vessel or unexpected calcification. Skull: Normal. Negative for fracture or focal lesion. Sinuses/Orbits: The visualized paranasal sinuses are clear. Bilateral mastoid effusions. Other: None IMPRESSION: 1. No acute intracranial pathology. 2. Moderate age-related atrophy and chronic microvascular ischemic changes. Electronically Signed   By: Angus Bark M.D.   On: 05/14/2023 11:37   Scheduled Meds:  atorvastatin   80 mg Oral QHS   Chlorhexidine  Gluconate Cloth  6 each Topical Daily   chlorthalidone   25 mg Oral Daily   cholecalciferol   2,000 Units Oral Daily   folic acid   1 mg Oral Daily   magnesium  oxide  400 mg Oral Daily   metoprolol  succinate  25 mg Oral Daily   pantoprazole   40 mg Oral Daily   povidone-iodine   2 Application Topical Once   senna  1 tablet Oral BID   tamsulosin   0.4 mg Oral Daily   umeclidinium bromide   1 puff Inhalation Daily   Continuous Infusions:   ceFAZolin  (ANCEF ) IV     cefTRIAXone  (ROCEPHIN )  IV 1 g (05/15/23 0742)   tranexamic acid        LOS: 1 day    Time spent: 50 mins    Magdalene School, MD [facility]   If 7PM-7AM, please contact night-coverage

## 2023-05-15 NOTE — Transfer of Care (Signed)
 Immediate Anesthesia Transfer of Care Note  Patient: Darryl Ward  Procedure(s) Performed: RIGHT HIP HEMIARTHROPLASTY REVISION WITH OPEN REDUCTION INTERNAL FIXATION OF PERIPROSTHETIC FEMUR FRACTURE (Right: Hip)  Patient Location: PACU  Anesthesia Type:General  Level of Consciousness: drowsy  Airway & Oxygen Therapy: Patient Spontanous Breathing and Patient connected to face mask oxygen  Post-op Assessment: Report given to RN and Post -op Vital signs reviewed and stable  Post vital signs: Reviewed and stable  Last Vitals:  Vitals Value Taken Time  BP 132/68 05/15/23 1633  Temp    Pulse    Resp 17 05/15/23 1635  SpO2    Vitals shown include unfiled device data.  Last Pain:  Vitals:   05/15/23 1124  TempSrc:   PainSc: 0-No pain      Patients Stated Pain Goal: 4 (05/14/23 2204)  Complications: No notable events documented.

## 2023-05-15 NOTE — Anesthesia Postprocedure Evaluation (Signed)
 Anesthesia Post Note  Patient: Darryl Ward  Procedure(s) Performed: RIGHT HIP HEMIARTHROPLASTY REVISION WITH OPEN REDUCTION INTERNAL FIXATION OF PERIPROSTHETIC FEMUR FRACTURE (Right: Hip)     Patient location during evaluation: PACU Anesthesia Type: General Level of consciousness: awake Pain management: pain level controlled Vital Signs Assessment: post-procedure vital signs reviewed and stable Respiratory status: spontaneous breathing, nonlabored ventilation and respiratory function stable Cardiovascular status: blood pressure returned to baseline and stable Postop Assessment: no apparent nausea or vomiting Anesthetic complications: no   No notable events documented.  Last Vitals:  Vitals:   05/15/23 1645 05/15/23 1700  BP: 133/66 133/68  Pulse: 69 66  Resp: 15 12  Temp: 36.5 C 36.4 C  SpO2: 99% 100%    Last Pain:  Vitals:   05/15/23 1700  TempSrc:   PainSc: Asleep                 Conard Decent

## 2023-05-15 NOTE — Telephone Encounter (Signed)
 Call to terry POA, reviewed results. She verbalized understanding. She stated he is in surgery right now, fell yesterday and broke his hip. She will call back if needed to set up VV

## 2023-05-15 NOTE — Interval H&P Note (Signed)
 History and Physical Interval Note:  05/15/2023 1:41 PM  Darryl Ward  has presented today for surgery, with the diagnosis of RIGHT HIP FRACTURE.  The various methods of treatment have been discussed with the patient and family. After consideration of risks, benefits and other options for treatment, the patient has consented to  Procedure(s): ARTHROPLASTY, HIP, TOTAL,POSTERIOR APPROACH (Right) as a surgical intervention.  The patient's history has been reviewed, patient examined, no change in status, stable for surgery.  I have reviewed the patient's chart and labs.  Questions were answered to the patient's satisfaction.     Saundra Curl

## 2023-05-16 ENCOUNTER — Inpatient Hospital Stay (HOSPITAL_COMMUNITY)

## 2023-05-16 ENCOUNTER — Encounter (HOSPITAL_COMMUNITY): Payer: Self-pay | Admitting: Orthopedic Surgery

## 2023-05-16 DIAGNOSIS — S72001A Fracture of unspecified part of neck of right femur, initial encounter for closed fracture: Secondary | ICD-10-CM | POA: Diagnosis not present

## 2023-05-16 LAB — COMPREHENSIVE METABOLIC PANEL WITH GFR
ALT: 9 U/L (ref 0–44)
AST: 18 U/L (ref 15–41)
Albumin: 2.9 g/dL — ABNORMAL LOW (ref 3.5–5.0)
Alkaline Phosphatase: 45 U/L (ref 38–126)
Anion gap: 9 (ref 5–15)
BUN: 22 mg/dL (ref 8–23)
CO2: 23 mmol/L (ref 22–32)
Calcium: 8.5 mg/dL — ABNORMAL LOW (ref 8.9–10.3)
Chloride: 101 mmol/L (ref 98–111)
Creatinine, Ser: 1.13 mg/dL (ref 0.61–1.24)
GFR, Estimated: >60 mL/min (ref >60)
Glucose, Bld: 123 mg/dL — ABNORMAL HIGH (ref 70–99)
Potassium: 4.1 mmol/L (ref 3.5–5.1)
Sodium: 133 mmol/L — ABNORMAL LOW (ref 135–145)
Total Bilirubin: 0.7 mg/dL (ref 0.0–1.2)
Total Protein: 6 g/dL — ABNORMAL LOW (ref 6.5–8.1)

## 2023-05-16 LAB — MAGNESIUM: Magnesium: 2.2 mg/dL (ref 1.7–2.4)

## 2023-05-16 LAB — CBC
HCT: 25.8 % — ABNORMAL LOW (ref 39.0–52.0)
Hemoglobin: 8.4 g/dL — ABNORMAL LOW (ref 13.0–17.0)
MCH: 31.5 pg (ref 26.0–34.0)
MCHC: 32.6 g/dL (ref 30.0–36.0)
MCV: 96.6 fL (ref 80.0–100.0)
Platelets: 136 10*3/uL — ABNORMAL LOW (ref 150–400)
RBC: 2.67 MIL/uL — ABNORMAL LOW (ref 4.22–5.81)
RDW: 14.2 % (ref 11.5–15.5)
WBC: 9.9 10*3/uL (ref 4.0–10.5)
nRBC: 0 % (ref 0.0–0.2)

## 2023-05-16 LAB — PHOSPHORUS: Phosphorus: 3.6 mg/dL (ref 2.5–4.6)

## 2023-05-16 MED ORDER — BACLOFEN 10 MG PO TABS
5.0000 mg | ORAL_TABLET | Freq: Once | ORAL | Status: AC
  Administered 2023-05-17: 5 mg via ORAL
  Filled 2023-05-16: qty 1

## 2023-05-16 MED ORDER — APIXABAN 5 MG PO TABS
5.0000 mg | ORAL_TABLET | Freq: Two times a day (BID) | ORAL | Status: DC
  Administered 2023-05-16 – 2023-05-20 (×9): 5 mg via ORAL
  Filled 2023-05-16 (×9): qty 1

## 2023-05-16 NOTE — Progress Notes (Signed)
 Mobility Specialist - Progress Note   05/16/23 1217  Mobility  Activity Transferred from chair to bed  Level of Assistance Minimal assist, patient does 75% or more  Assistive Device Front wheel walker  Range of Motion/Exercises Active Assistive  Activity Response Tolerated fair  Mobility visit 1 Mobility  Mobility Specialist Start Time (ACUTE ONLY) 1200  Mobility Specialist Stop Time (ACUTE ONLY) 1217  Mobility Specialist Time Calculation (min) (ACUTE ONLY) 17 min   Student-RN requested assistance with transferring pt back to bed. Pt returned to bed with all needs met and student-RN in room. RN notified.  Lorna Rose Mobility Specialist

## 2023-05-16 NOTE — Progress Notes (Signed)
 PROGRESS NOTE    Darryl Ward  OZH:[id number] DOB: 1945-11-12 DOA: 05/14/2023 PCP: Cook, Jayce G, DO   Brief Narrative:  This 78 yrs old male, with past medical history significant of congestive heart failure, peripheral vascular disease, CAD, hypertension, paroxysmal A-fib on Eliquis , primary adenocarcinoma of the right lung, following with Dr. 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  Patient reports fall happened when he was rolling out of the bed,  sustained a right hip pain and was unable to stand up after the fall, he usually ambulates with a cane and a walker.  Family found him 2 hours later and called the EMS.  Patient does have history of right hip surgical repair last year,  He is on Eliquis ,  the last time he took Eliquis  was yesterday evening.  Workup in the ED CT right hip significant for periprosthetic fracture.  Patient is admitted for further evaluation orthopedics is consulted. Patient is scheduled to have right hip arthroplasty today.  Assessment & Plan:   Principal Problem:   Closed right hip fracture (HCC) Active Problems:   GERD without esophagitis   Paroxysmal atrial fibrillation (HCC)   Coronary atherosclerosis of native coronary artery   COPD (chronic obstructive pulmonary disease) (HCC)   Essential hypertension   Chronic combined systolic and diastolic CHF (congestive heart failure) (HCC)   Benign prostatic hyperplasia with lower urinary tract symptoms   Depression   Malnutrition of moderate degree  Right hip fracture: Patient sustained a fall from the bed when he was rolling over,  Imaging significant for periprostatic right hip fracture. Orthopedic consulted.  S/p successful ORIF POD # 1 Adequate pain control with pain medicines, antinausea medication. Holding Eliquis  in anticipation of surgery. PT and OT recommended SNF. TOC looking for SNF options.   History of CVA: Held Eliquis  in anticipation  of surgery , resume once cleared by orthopedic.   History of GERD: Continue with PPI.   Paroxysmal atrial fibrillation (HCC): Continue with Toprol -XL. HR controlled. Resume Eliquis  when deemed by orthopedic.   CKD stage III A: Serum Creatinine at baseline.   History of lung cancer: Continue outpatient follow-up with Dr. Cheree Cords.   Depression: Continue Lexapro .   Benign prostatic hyperplasia with lower urinary tract symptoms: Continue treatment with Flomax . Bladder looks distended on CT abdomen, will check bladder scan and if has high-volume will insert Foley catheter.  UA consistent with UTI. Continue ceftriaxone ,  follow-up urine culture   Essential hypertension: Continue current antihypertensive agents.   COPD (chronic obstructive pulmonary disease) (HCC): No active wheezing Continue bronchodilator management.   Tobacco abuse: Cessation counseling provided.    DVT prophylaxis: SCDs Code Status: Full code Family Communication: No Family at bedside Disposition Plan:    Status is: Inpatient Remains inpatient appropriate because: Admitted for right hip fracture, status post ORIF  POD #1.     Consultants:  Orthopeadics  Procedures: Scheduled for hardware removal ORIF and revision hemiarthroplasty Antimicrobials:  Anti-infectives (From admission, onward)    Start     Dose/Rate Route Frequency Ordered Stop   05/15/23 0845  ceFAZolin  (ANCEF ) IVPB 2g/100 mL premix        2 g 200 mL/hr over 30 Minutes Intravenous On call to O.R. 05/15/23 0749 05/15/23 1415   05/15/23 0730  cefTRIAXone  (ROCEPHIN ) 1 g in sodium chloride  0.9 % 100 mL IVPB        1 g 200 mL/hr over 30 Minutes Intravenous Every 24  hours 05/15/23 8119        Subjective: Patient was seen and examined at bedside.  Overnight events noted. Patient reports feeling better,  pain is much improved.  Status post ORIF.  POD 1.   Objective: Vitals:   05/16/23 0500 05/16/23 0814 05/16/23 0914 05/16/23  1150  BP: 114/73  (!) 106/52 112/65  Pulse: 69 77 74 77  Resp:  (!) 22 17   Temp: 97.7 F (36.5 C)  97.8 F (36.6 C) 98 F (36.7 C)  TempSrc: Oral  Oral Oral  SpO2: 100% 100% 100% 100%  Weight:      Height:        Intake/Output Summary (Last 24 hours) at 05/16/2023 1327 Last data filed at 05/16/2023 1200 Gross per 24 hour  Intake 2580 ml  Output 1525 ml  Net 1055 ml   Filed Weights   05/14/23 1036 05/15/23 1124  Weight: 78.9 kg 78.9 kg    Examination:  General exam: Appears calm and comfortable, not in any acute distress. Respiratory system: Clear to auscultation. Respiratory effort normal.  RR 16 Cardiovascular system: S1 & S2 heard, RRR. No JVD, murmurs, rubs, gallops or clicks.  Gastrointestinal system: Abdomen is non distended, soft and non tender. Normal bowel sounds heard. Central nervous system: Alert and oriented x 3. No focal neurological deficits. Extremities: Right hip tenderness, status post ORIF, POD #1. Skin: No rashes, lesions or ulcers Psychiatry: Judgement and insight appear normal. Mood & affect appropriate.     Data Reviewed: I have personally reviewed following labs and imaging studies  CBC: Recent Labs  Lab 05/14/23 1203 05/15/23 0521 05/16/23 0426  WBC 12.4* 7.8 9.9  NEUTROABS 11.0*  --   --   HGB 11.5* 10.5* 8.4*  HCT 34.8* 31.6* 25.8*  MCV 94.6 95.5 96.6  PLT 159 142* 136*   Basic Metabolic Panel: Recent Labs  Lab 05/14/23 1203 05/15/23 0521 05/16/23 0426  NA 134* 137 133*  K 3.5 3.7 4.1  CL 99 104 101  CO2 23 24 23   GLUCOSE 127* 111* 123*  BUN 21 19 22   CREATININE 1.43* 0.99 1.13  CALCIUM  9.1 8.8* 8.5*  MG  --   --  2.2  PHOS  --   --  3.6   GFR: Estimated Creatinine Clearance: 57.4 mL/min (by C-G formula based on SCr of 1.13 mg/dL). Liver Function Tests: Recent Labs  Lab 05/16/23 0426  AST 18  ALT 9  ALKPHOS 45  BILITOT 0.7  PROT 6.0*  ALBUMIN  2.9*   No results for input(s): "LIPASE", "AMYLASE" in the last 168  hours. No results for input(s): "AMMONIA" in the last 168 hours. Coagulation Profile: No results for input(s): "INR", "PROTIME" in the last 168 hours. Cardiac Enzymes: Recent Labs  Lab 05/14/23 1203  CKTOTAL 147   BNP (last 3 results) No results for input(s): "PROBNP" in the last 8760 hours. HbA1C: No results for input(s): "HGBA1C" in the last 72 hours. CBG: No results for input(s): "GLUCAP" in the last 168 hours. Lipid Profile: No results for input(s): "CHOL", "HDL", "LDLCALC", "TRIG", "CHOLHDL", "LDLDIRECT" in the last 72 hours. Thyroid  Function Tests: No results for input(s): "TSH", "T4TOTAL", "FREET4", "T3FREE", "THYROIDAB" in the last 72 hours. Anemia Panel: No results for input(s): "VITAMINB12", "FOLATE", "FERRITIN", "TIBC", "IRON", "RETICCTPCT" in the last 72 hours. Sepsis Labs: No results for input(s): "PROCALCITON", "LATICACIDVEN" in the last 168 hours.  Recent Results (from the past 240 hours)  MRSA Next Gen by PCR, Nasal  Status: None   Collection Time: 05/15/23  5:27 AM   Specimen: Nasal Mucosa; Nasal Swab  Result Value Ref Range Status   MRSA by PCR Next Gen NOT DETECTED NOT DETECTED Final    Comment: (NOTE) The GeneXpert MRSA Assay (FDA approved for NASAL specimens only), is one component of a comprehensive MRSA colonization surveillance program. It is not intended to diagnose MRSA infection nor to guide or monitor treatment for MRSA infections. Test performance is not FDA approved in patients less than 39 years old. Performed at [facility], [address], [place], [place] [address]     Radiology Studies: DG HIP UNILAT WITH PELVIS 2-3 VIEWS RIGHT Result Date: 05/16/2023 CLINICAL DATA:  Status post right hip hemiarthroplasty revision. EXAM: DG HIP (WITH OR WITHOUT PELVIS) 2-3V RIGHT COMPARISON:  CT of the right hip dated 05/14/2023. FINDINGS: Status post right hip hemiarthroplasty revision with cerclage wire at the level of the known  intertrochanteric fracture. Hardware is intact with appropriate alignment. No acute complication. Expected postoperative soft tissue swelling and air of the right hip. Lumbosacral fixation hardware in place. IMPRESSION: Status post right hip hemiarthroplasty revision with cerclage wire at the level of the known intertrochanteric fracture. No acute complication. Electronically Signed   By: Mannie Seek M.D.   On: 05/16/2023 09:37   CT Hip Right Wo Contrast Result Date: 05/14/2023 CLINICAL DATA:  Right hip pain after fall. Concern for periprosthetic fracture. EXAM: CT OF THE RIGHT HIP WITHOUT CONTRAST TECHNIQUE: Multidetector CT imaging of the right hip was performed according to the standard protocol. Multiplanar CT image reconstructions were also generated. RADIATION DOSE REDUCTION: This exam was performed according to the departmental dose-optimization program which includes automated exposure control, adjustment of the mA and/or kV according to patient size and/or use of iterative reconstruction technique. COMPARISON:  Right hip radiographs dated 05/14/2023 at 11:23 a.m. FINDINGS: Bones/Joint/Cartilage Status post right hip arthroplasty with associated streak artifact which limits detailed evaluation of the surrounding bone and soft tissues. There is a fracture extending along the posterolateral proximal femoral metadiaphysis with approximately 6 mm of posterior displacement at the distal medial margin (series 3, images 89-96 and series 7, images 42-47). The femoral and acetabular prosthetic components are intact with appropriate alignment. Thin curvilinear mineralization adjacent to the posterior aspect of the posterior facet of the right greater trochanter, concerning for avulsion fracture. (Series 3, image 56 and series 7, image 43). The right sacroiliac joints and pubic symphysis are intact. Partially visualized lumbosacral fixation hardware. Ligaments Ligaments are suboptimally evaluated by CT. Soft  tissue and Muscles Soft tissue swelling overlying the right lateral hip. No discrete intramuscular collection identified. Other: Air within the nondependent aspect of the distended bladder. Atherosclerotic vascular calcification. IMPRESSION: 1. Status post right hip arthroplasty with associated streak artifact which limits detailed evaluation of the surrounding bone and soft tissues. There is a mildly displaced periprosthetic fracture of the posterior proximal femoral metadiaphysis. The femoral and acetabular prosthetic components are intact with appropriate alignment. 2. Thin curvilinear mineralization adjacent to the posterior aspect of the posterior facet of the right greater trochanter, concerning for avulsion fracture. 3. Air within the nondependent aspect of the distended bladder may be secondary to recent instrumentation or infection. Clinical correlation is recommended. Electronically Signed   By: Mannie Seek M.D.   On: 05/14/2023 15:07   Scheduled Meds:  acetaminophen   1,000 mg Oral Q6H   apixaban   5 mg Oral BID   atorvastatin   80 mg Oral QHS  Chlorhexidine  Gluconate Cloth  6 each Topical Daily   chlorthalidone   25 mg Oral Daily   cholecalciferol   2,000 Units Oral Daily   docusate sodium   100 mg Oral BID   feeding supplement  237 mL Oral TID BM   folic acid   1 mg Oral Daily   magnesium  oxide  400 mg Oral Daily   metoprolol  succinate  25 mg Oral Daily   multivitamin with minerals  1 tablet Oral Daily   pantoprazole   40 mg Oral Daily   senna  1 tablet Oral BID   tamsulosin   0.4 mg Oral Daily   umeclidinium bromide   1 puff Inhalation Daily   Continuous Infusions:  cefTRIAXone  (ROCEPHIN )  IV 1 g (05/16/23 0813)     LOS: 2 days    Time spent: 35 mins    Magdalene School, MD [facility]   If 7PM-7AM, please contact night-coverage

## 2023-05-16 NOTE — TOC Initial Note (Addendum)
 Transition of Care [facility]) - Initial/Assessment Note    Patient Details  Name: Darryl Ward MRN: [id number] Date of Birth: 27-Jan-1945  [facility] CM/SW Contact:    Ruben Corolla, RN Phone Number: 05/16/2023, 12:58 PM  Clinical Narrative: Patient's d/c plan rehab-has gone to [facility] in past-prefer there again-will await PT recc. Left vm w/[facility] rep Athena Bland Barber(admissions coordinator)-await return call.Weares eyeglasses,bilater hearing aids,dentures top/bottom,heart healthy diet,has roommate(friend),has cane,rw,w/c @ home. S/p R hip fx/surgery.  -1:24P Received call back from Athena Bland w/[facility]-patient will be in co pay days-he was admitted to [facility] on 3/21. -3:28p faxed to [facility] rep Athena Bland aware & will accept-await to contact health team advantage Sunday for auth for [facility], & PTAR to be d/c Monday. Expected Discharge Plan: Skilled Nursing Facility Barriers to Discharge: Continued Medical Work up   Patient Goals and CMS Choice Patient states their goals for this hospitalization and ongoing recovery are:: Rehab-prefers [facility]-has gone ther in past. CMS Medicare.gov Compare Post Acute Care list provided to:: Patient Choice offered to / list presented to : Patient Rachel ownership interest in [facility].provided to:: Patient    Expected Discharge Plan and Services   Discharge Planning Services: CM Consult Post Acute Care Choice: Skilled Nursing Facility Living arrangements for the past 2 months: Single Family Home                                      Prior Living Arrangements/Services Living arrangements for the past 2 months: Single Family Home Lives with:: Roommate (w/c,rw,cane)              Current home services: DME    Activities of Daily Living   ADL Screening (condition at time of admission) Independently performs ADLs?: Yes (appropriate for developmental age) Is the patient deaf or have difficulty  hearing?: No Does the patient have difficulty seeing, even when wearing glasses/contacts?: No Does the patient have difficulty concentrating, remembering, or making decisions?: No  Permission Sought/Granted                  Emotional Assessment              Admission diagnosis:  Closed right hip fracture (HCC) [S72.001A] Peri-prosthetic fracture around prosthetic hip, initial encounter [W29.8XXA, Z96.649] Patient Active Problem List   Diagnosis Date Noted   Malnutrition of moderate degree 05/15/2023   Closed right hip fracture (HCC) 05/14/2023   Rib fractures 04/28/2023   Urinary incontinence 03/31/2023   Recurrent falls 03/11/2023   Paroxysmal atrial fibrillation (HCC) 03/11/2023   GERD without esophagitis 03/11/2023   Depression 03/11/2023   Benign prostatic hyperplasia with lower urinary tract symptoms 06/13/2022   Other intervertebral disc degeneration, lumbar region 05/01/2022   Normocytic anemia 04/28/2022   Iron deficiency anemia 04/22/2022   History of stroke 03/27/2022   Chronic combined systolic and diastolic CHF (congestive heart failure) (HCC) 03/27/2022   Essential hypertension 09/30/2021   Primary adenocarcinoma of upper lobe of right lung (HCC) 06/26/2021   COPD (chronic obstructive pulmonary disease) (HCC) 05/29/2021   Lumbar spinal stenosis 02/06/2020   Meningioma, cerebral (HCC) 02/05/2020   Chronic midline low back pain without sciatica 04/27/2019   Tobacco abuse 11/17/2018   PVD (peripheral vascular disease) (HCC) 05/21/2012   Mixed hyperlipidemia 12/13/2011   Coronary atherosclerosis of native coronary artery 12/13/2011   PCP:  Cook, Jayce G, DO  Pharmacy:   [facility] - [facility] [address] [place] [address] Phone: [phone] Fax: [phone]  [facility] - [place], [place] - [address] [place] [address] Phone: [phone] Fax: [phone]     Social  Drivers of Health (SDOH) Social History: SDOH Screenings   Food Insecurity: No Food Insecurity (05/14/2023)  Housing: Low Risk  (05/14/2023)  Transportation Needs: No Transportation Needs (05/14/2023)  Utilities: Not At Risk (05/14/2023)  Alcohol Screen: Low Risk  (08/30/2022)  Depression (PHQ2-9): High Risk (04/25/2023)  Financial Resource Strain: High Risk (08/30/2022)  Physical Activity: Insufficiently Active (08/30/2022)  Social Connections: Moderately Isolated (05/14/2023)  Stress: No Stress Concern Present (08/30/2022)  Tobacco Use: Medium Risk (05/15/2023)  Health Literacy: Adequate Health Literacy (08/30/2022)   SDOH Interventions:     Readmission Risk Interventions    03/13/2023   11:54 AM  Readmission Risk Prevention Plan  Transportation Screening Complete  HRI or Home Care Consult Complete  Social Work Consult for Recovery Care Planning/Counseling Complete  Palliative Care Screening Not Applicable  Medication Review Oceanographer) Complete

## 2023-05-16 NOTE — Op Note (Signed)
 05/15/2023  7:06 AM  PATIENT:  Darryl Ward    PRE-OPERATIVE DIAGNOSIS:  RIGHT HIP FRACTURE  POST-OPERATIVE DIAGNOSIS:  Same  PROCEDURE:  RIGHT HIP HEMIARTHROPLASTY REVISION WITH OPEN REDUCTION INTERNAL FIXATION OF PERIPROSTHETIC FEMUR FRACTURE  SURGEON:  Saundra Curl, MD  ASSISTANT: Marzella Solan, PA-C, he was present and scrubbed throughout the case, critical for completion in a timely fashion, and for retraction, instrumentation, and closure.   ANESTHESIA:   GEN  PREOPERATIVE INDICATIONS:  [name] is a  78 y.o. male with a diagnosis of RIGHT HIP FRACTURE who failed conservative measures and elected for surgical management.    The risks benefits and alternatives were discussed with the patient preoperatively including but not limited to the risks of infection, bleeding, nerve injury, cardiopulmonary complications, the need for revision surgery, among others, and the patient was willing to proceed.  OPERATIVE IMPLANTS: stryker restoration HA 9 x 155 with a 53-3 head  OPERATIVE FINDINGS: unstable hemiarthroplasty with intertrochanteric region fracture of the femur  BLOOD LOSS: 250  COMPLICATIONS: None  TOURNIQUET TIME: None  OPERATIVE PROCEDURE:  Patient was identified in the preoperative holding area and site was marked by me He was transported to the operating theater and placed on the table in supine position taking care to pad all bony prominences. After a preincinduction time out anesthesia was induced. The right lower extremity was prepped and draped in normal sterile fashion and a pre-incision timeout was performed. He received Ancef  for preoperative antibiotics.   He was placed in the lateral position all bony prominences were padded axillary roll was placed  I made a posterior approach to his hip through his previous incision I incised his IT band longitudinally  I performed a bursectomy of his greater trochanteric bursa for better visualization and pain  control  I incised the posterior capsule and placed stitches in this for later repair  I was able to dislocate and deliver his previous hemiarthroplasty this was very loose with visible fracture around it I was able to remove that whole.  Next I reduced his intertrochanteric fracture I placed a cable around this for fixation and to maintain hoop stress for revision hemiarthroplasty  I sequentially distally reamed the femur up to a 5255 mm diaphyseal fit stem distally reamed to a 14 5 reamer next I broached up to a size 9 broach and trialed a reduction I was happy with his length his anterior capsule was very tight due to his diminished ambulatory status.  I did select a 9 stem and the above-mentioned implants for good stability   I implanted the above mentioned implants and reduced the hip stability was good length was good again he has some anterior capsular contracture but hip was stable  I thoroughly irrigated prior to implantation and after we then closed the posterior capsule to drill tunnels in the greater trochanter closed the IT band and the skin in layers  POST OPERATIVE PLAN: He can weight-bear as tolerated physical therapy

## 2023-05-16 NOTE — [place] FL2 (Signed)
 [place]  MEDICAID FL2 LEVEL OF CARE FORM     IDENTIFICATION  Patient Name: Darryl Ward Birthdate: Aug 09, 1945 Sex: male Admission Date (Current Location): 05/14/2023  [facility] and [place] Number:  Producer, television/film/video and Address:  [facility],  [address], [place] [address]      Provider Number: [id number]  Attending Physician Name and Address:  Magdalene School, MD  Relative Name and Phone Number:  [name](s/o)[phone]    Current Level of Care: Hospital Recommended Level of Care: Skilled Nursing Facility Prior Approval Number:    Date Approved/Denied:   PASRR Number: [id number]  Discharge Plan: SNF    Current Diagnoses: Patient Active Problem List   Diagnosis Date Noted   Malnutrition of moderate degree 05/15/2023   Closed right hip fracture (HCC) 05/14/2023   Rib fractures 04/28/2023   Urinary incontinence 03/31/2023   Recurrent falls 03/11/2023   Paroxysmal atrial fibrillation (HCC) 03/11/2023   GERD without esophagitis 03/11/2023   Depression 03/11/2023   Benign prostatic hyperplasia with lower urinary tract symptoms 06/13/2022   Other intervertebral disc degeneration, lumbar region 05/01/2022   Normocytic anemia 04/28/2022   Iron deficiency anemia 04/22/2022   History of stroke 03/27/2022   Chronic combined systolic and diastolic CHF (congestive heart failure) (HCC) 03/27/2022   Essential hypertension 09/30/2021   Primary adenocarcinoma of upper lobe of right lung (HCC) 06/26/2021   COPD (chronic obstructive pulmonary disease) (HCC) 05/29/2021   Lumbar spinal stenosis 02/06/2020   Meningioma, cerebral (HCC) 02/05/2020   Chronic midline low back pain without sciatica 04/27/2019   Tobacco abuse 11/17/2018   PVD (peripheral vascular disease) (HCC) 05/21/2012   Mixed hyperlipidemia 12/13/2011   Coronary atherosclerosis of native coronary artery 12/13/2011    Orientation RESPIRATION BLADDER Height & Weight     Self, Time,  Situation, Place  Normal Continent Weight: 78.9 kg Height:  5\' 10"  (177.8 cm)  BEHAVIORAL SYMPTOMS/MOOD NEUROLOGICAL BOWEL NUTRITION STATUS      Continent Diet (Heart Healthy)  AMBULATORY STATUS COMMUNICATION OF NEEDS Skin   Limited Assist Verbally Surgical wounds (R hip surgical wound)                       Personal Care Assistance Level of Assistance  Bathing, Feeding, Dressing Bathing Assistance: Limited assistance Feeding assistance: Limited assistance Dressing Assistance: Limited assistance     Functional Limitations Info  Sight, Hearing, Speech Sight Info: Impaired (eyeglasses) Hearing Info: Impaired (Bilateral hearing aids) Speech Info: Impaired (Dentures-top/bottom)    SPECIAL CARE FACTORS FREQUENCY  PT (By licensed PT), OT (By licensed OT)     PT Frequency: 5x week OT Frequency: 5x week            Contractures Contractures Info: Not present    Additional Factors Info  Code Status, Allergies Code Status Info: Full Allergies Info: NKDA           Current Medications (05/16/2023):  This is the current hospital active medication list Current Facility-Administered Medications  Medication Dose Route Frequency Provider Last Rate Last Admin   acetaminophen  (TYLENOL ) tablet 325-650 mg  325-650 mg Oral Q6H PRN Gawne, Meghan M, PA-C       alum & mag hydroxide-simeth (MAALOX/MYLANTA) 200-200-20 MG/5ML suspension 30 mL  30 mL Oral Q4H PRN Gawne, Meghan M, PA-C       apixaban  (ELIQUIS ) tablet 5 mg  5 mg Oral BID Arlyne Bering T, RPH   5 mg at 05/16/23 1135   atorvastatin  (LIPITOR ) tablet  80 mg  80 mg Oral QHS Gawne, Meghan M, PA-C   80 mg at 05/15/23 2121   bisacodyl  (DULCOLAX) suppository 10 mg  10 mg Rectal Daily PRN Gawne, Meghan M, PA-C       cefTRIAXone  (ROCEPHIN ) 1 g in sodium chloride  0.9 % 100 mL IVPB  1 g Intravenous Q24H Gawne, Meghan M, PA-C 200 mL/hr at 05/16/23 0813 1 g at 05/16/23 0813   Chlorhexidine  Gluconate Cloth 2 % PADS 6 each  6 each Topical  Daily Gawne, Meghan M, PA-C   6 each at 05/16/23 1610   chlorthalidone  (HYGROTON ) tablet 25 mg  25 mg Oral Daily Gawne, Meghan M, PA-C   25 mg at 05/16/23 1136   cholecalciferol  (VITAMIN D3) 25 MCG (1000 UNIT) tablet 2,000 Units  2,000 Units Oral Daily Gawne, Meghan M, PA-C   2,000 Units at 05/16/23 1134   docusate sodium  (COLACE) capsule 100 mg  100 mg Oral BID Gawne, Meghan M, PA-C   100 mg at 05/16/23 1135   feeding supplement (ENSURE ENLIVE / ENSURE PLUS) liquid 237 mL  237 mL Oral TID BM Gawne, Meghan M, PA-C   237 mL at 05/16/23 1457   folic acid  (FOLVITE ) tablet 1 mg  1 mg Oral Daily Gawne, Meghan M, PA-C   1 mg at 05/16/23 1134   HYDROmorphone  (DILAUDID ) injection 0.5 mg  0.5 mg Intravenous Q4H PRN Gawne, Meghan M, PA-C   0.5 mg at 05/16/23 0240   magnesium  citrate solution 1 Bottle  1 Bottle Oral Once PRN Gawne, Meghan M, PA-C       magnesium  oxide (MAG-OX) tablet 400 mg  400 mg Oral Daily Gawne, Meghan M, PA-C   400 mg at 05/16/23 1135   menthol -cetylpyridinium (CEPACOL) lozenge 3 mg  1 lozenge Oral PRN Gawne, Meghan M, PA-C       Or   phenol (CHLORASEPTIC) mouth spray 1 spray  1 spray Mouth/Throat PRN Gawne, Meghan M, PA-C       methocarbamol  (ROBAXIN ) tablet 500 mg  500 mg Oral Q6H PRN Gawne, Meghan M, PA-C       Or   methocarbamol  (ROBAXIN ) injection 500 mg  500 mg Intravenous Q6H PRN Gawne, Meghan M, PA-C       metoCLOPramide  (REGLAN ) tablet 5-10 mg  5-10 mg Oral Q8H PRN Gawne, Meghan M, PA-C       Or   metoCLOPramide  (REGLAN ) injection 5-10 mg  5-10 mg Intravenous Q8H PRN Gawne, Meghan M, PA-C       metoprolol  succinate (TOPROL -XL) 24 hr tablet 25 mg  25 mg Oral Daily Gawne, Meghan M, PA-C   25 mg at 05/16/23 1152   multivitamin with minerals tablet 1 tablet  1 tablet Oral Daily Gawne, Meghan M, PA-C   1 tablet at 05/16/23 1135   ondansetron  (ZOFRAN ) tablet 4 mg  4 mg Oral Q6H PRN Gawne, Meghan M, PA-C       Or   ondansetron  (ZOFRAN ) injection 4 mg  4 mg Intravenous Q6H PRN  Gawne, Meghan M, PA-C       oxyCODONE  (Oxy IR/ROXICODONE ) immediate release tablet 2.5 mg  2.5 mg Oral Q4H PRN Gawne, Meghan M, PA-C       oxyCODONE  (Oxy IR/ROXICODONE ) immediate release tablet 5 mg  5 mg Oral Q4H PRN Gawne, Meghan M, PA-C   5 mg at 05/16/23 1500   pantoprazole  (PROTONIX ) EC tablet 40 mg  40 mg Oral Daily Gawne, Meghan M, PA-C   40 mg at 05/16/23  1135   polyethylene glycol (MIRALAX  / GLYCOLAX ) packet 17 g  17 g Oral Daily PRN Gawne, Meghan M, PA-C       senna (SENOKOT) tablet 8.6 mg  1 tablet Oral BID Gawne, Meghan M, PA-C   8.6 mg at 05/16/23 1135   tamsulosin  (FLOMAX ) capsule 0.4 mg  0.4 mg Oral Daily Gawne, Meghan M, PA-C   0.4 mg at 05/16/23 1135   umeclidinium bromide  (INCRUSE ELLIPTA ) 62.5 MCG/ACT 1 puff  1 puff Inhalation Daily Gawne, Meghan M, PA-C   1 puff at 05/16/23 0813     Discharge Medications: Please see discharge summary for a list of discharge medications.  Relevant Imaging Results:  Relevant Lab Results:   Additional Information [phone]  Aniyiah Zell, Thersia Flax, RN

## 2023-05-16 NOTE — Evaluation (Signed)
 Physical Therapy Evaluation Patient Details Name: Darryl Ward MRN: [id number] DOB: 10-28-1945 Today's Date: 05/16/2023  History of Present Illness  Pt admitted from home s/p fall with R THR periprosthetic fx and now s/p R hip hemiarthroplasty revision with ORIF of periprosthetic femur fx .  Pt with hx of R hip fx with THR 4/24, COPD, NSTEMI, CVA, TIA, lumbar fusion, bilaterl femoral artery bypass graft, COPD and AAA  Clinical Impression  Pt admitted as above and presenting with functional mobility limitations 2* generalized weakness, post op pain, and decreased R LE strength/ROM.  Patient will benefit from continued inpatient follow up therapy, <3 hours/day to maximize IND and safety prior to return home with limited assist.         If plan is discharge home, recommend the following: A lot of help with walking and/or transfers;A lot of help with bathing/dressing/bathroom;Assistance with cooking/housework;Assist for transportation;Help with stairs or ramp for entrance   Can travel by private vehicle   No    Equipment Recommendations None recommended by PT  Recommendations for Other Services       Functional Status Assessment Patient has had a recent decline in their functional status and demonstrates the ability to make significant improvements in function in a reasonable and predictable amount of time.     Precautions / Restrictions Precautions Precautions: Fall Precaution/Restrictions Comments: NO THP per PA when contacted by RN Restrictions Weight Bearing Restrictions Per Provider Order: No      Mobility  Bed Mobility Overal bed mobility: Needs Assistance Bed Mobility: Supine to Sit     Supine to sit: Max assist, +2 for physical assistance, +2 for safety/equipment     General bed mobility comments: Increased time with cues for sequence, assist to manage R LE and control trunk; and use of bed pad to complete rotation to EOB sitting    Transfers Overall transfer  level: Needs assistance Equipment used: Rolling walker (2 wheels) Transfers: Sit to/from Stand, Bed to chair/wheelchair/BSC Sit to Stand: Min assist, Mod assist, +2 physical assistance, +2 safety/equipment, From elevated surface   Step pivot transfers: Min assist, Mod assist, +2 physical assistance, +2 safety/equipment       General transfer comment: cues for LE management and use of UEs to self assist; physical assist to bring wt up and fwd and to balance in standing with RW.  Step pvt bed to recliner    Ambulation/Gait Ambulation/Gait assistance: Min assist, Mod assist, +2 physical assistance, +2 safety/equipment Gait Distance (Feet): 2 Feet Assistive device: Rolling walker (2 wheels) Gait Pattern/deviations: Step-to pattern, Decreased step length - right, Decreased step length - left, Shuffle, Trunk flexed Gait velocity: decr     General Gait Details: Increased time with cues for sequence, posture and position from RW.  Physical assist for balance/support, RW management and to advance R LE  Stairs            Wheelchair Mobility     Tilt Bed    Modified Rankin (Stroke Patients Only)       Balance Overall balance assessment: Needs assistance Sitting-balance support: No upper extremity supported, Feet supported Sitting balance-Leahy Scale: Fair     Standing balance support: Bilateral upper extremity supported Standing balance-Leahy Scale: Poor                               Pertinent Vitals/Pain Pain Assessment Pain Assessment: Faces Faces Pain Scale: Hurts even more Pain Location: R  hip with activity Pain Descriptors / Indicators: Aching, Sore Pain Intervention(s): Limited activity within patient's tolerance, Monitored during session, Premedicated before session, Ice applied    Home Living Family/patient expects to be discharged to:: Skilled nursing facility                        Prior Function Prior Level of Function :  Independent/Modified Independent             Mobility Comments: PRN use of RW and quad cane; leaning on furniture and walls in the house. ADLs Comments: States he was back to mowing lawn this spring with riding mower     Extremity/Trunk Assessment   Upper Extremity Assessment Upper Extremity Assessment: Overall WFL for tasks assessed    Lower Extremity Assessment Lower Extremity Assessment: RLE deficits/detail RLE Deficits / Details: AAROM at hip to 80 flex       Communication   Communication Communication: Impaired Factors Affecting Communication: Hearing impaired    Cognition Arousal: Alert Behavior During Therapy: WFL for tasks assessed/performed   PT - Cognitive impairments: No apparent impairments                         Following commands: Intact       Cueing Cueing Techniques: Verbal cues, Gestural cues     General Comments      Exercises Total Joint Exercises Ankle Circles/Pumps: AROM, Both, 15 reps, Supine Heel Slides: AAROM, Right, 15 reps, Supine   Assessment/Plan    PT Assessment Patient needs continued PT services  PT Problem List Decreased strength;Decreased range of motion;Decreased activity tolerance;Decreased balance;Decreased mobility;Decreased knowledge of use of DME;Pain       PT Treatment Interventions DME instruction;Gait training;Functional mobility training;Therapeutic activities;Therapeutic exercise;Balance training;Patient/family education    PT Goals (Current goals can be found in the Care Plan section)  Acute Rehab PT Goals Patient Stated Goal: Rehab at [facility] to regain IND PT Goal Formulation: With patient Time For Goal Achievement: 05/29/23 Potential to Achieve Goals: Fair    Frequency Min 3X/week     Co-evaluation               AM-PAC PT "6 Clicks" Mobility  Outcome Measure Help needed turning from your back to your side while in a flat bed without using bedrails?: A Lot Help needed moving  from lying on your back to sitting on the side of a flat bed without using bedrails?: A Lot Help needed moving to and from a bed to a chair (including a wheelchair)?: A Lot Help needed standing up from a chair using your arms (e.g., wheelchair or bedside chair)?: A Lot Help needed to walk in hospital room?: Total Help needed climbing 3-5 steps with a railing? : Total 6 Click Score: 10    End of Session Equipment Utilized During Treatment: Gait belt Activity Tolerance: Patient tolerated treatment well;Patient limited by fatigue Patient left: in chair;with call bell/phone within reach;with chair alarm set Nurse Communication: Mobility status PT Visit Diagnosis: Difficulty in walking, not elsewhere classified (R26.2);Muscle weakness (generalized) (M62.81);Pain Pain - Right/Left: Right Pain - part of body: Hip    Time: 4403-4742 PT Time Calculation (min) (ACUTE ONLY): 34 min   Charges:   PT Evaluation $PT Eval Low Complexity: 1 Low PT Treatments $Therapeutic Activity: 8-22 mins PT General Charges $$ ACUTE PT VISIT: 1 Visit         Thedora Finlay PT Acute Rehabilitation Services  Pager [phone] Office [phone]   Raysa Bosak 05/16/2023, 1:26 PM

## 2023-05-17 DIAGNOSIS — S72001A Fracture of unspecified part of neck of right femur, initial encounter for closed fracture: Secondary | ICD-10-CM | POA: Diagnosis not present

## 2023-05-17 NOTE — Progress Notes (Signed)
 PROGRESS NOTE    Darryl Ward  ZOX:[id number] DOB: 04-Jan-1946 DOA: 05/14/2023 PCP: Cook, Jayce G, DO   Brief Narrative:  This 78 yrs old male, with past medical history significant of congestive heart failure, peripheral vascular disease, CAD, hypertension, paroxysmal A-fib on Eliquis , primary adenocarcinoma of the right lung, following with Dr. 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  Patient reports fall happened when he was rolling out of the bed,  sustained a right hip pain and was unable to stand up after the fall, he usually ambulates with a cane and a walker.  Family found him 2 hours later and called the EMS.  Patient does have history of right hip surgical repair last year,  He is on Eliquis ,  the last time he took Eliquis  was yesterday evening.  Workup in the ED CT right hip significant for periprosthetic fracture.  Patient is admitted for further evaluation orthopedics is consulted. Patient is scheduled to have right hip arthroplasty today.  Assessment & Plan:   Principal Problem:   Closed right hip fracture (HCC) Active Problems:   GERD without esophagitis   Paroxysmal atrial fibrillation (HCC)   Coronary atherosclerosis of native coronary artery   COPD (chronic obstructive pulmonary disease) (HCC)   Essential hypertension   Chronic combined systolic and diastolic CHF (congestive heart failure) (HCC)   Benign prostatic hyperplasia with lower urinary tract symptoms   Depression   Malnutrition of moderate degree  Right hip fracture: Patient sustained a fall from the bed when he was rolling over,  Imaging significant for periprostatic right hip fracture. Orthopedic consulted.  S/p successful ORIF POD # 2 Adequate pain control with pain medicines, antinausea medication. Held Eliquis  in anticipation of surgery. PT and OT recommended SNF. TOC looking for SNF options.   History of CVA: Eliquis  resumed.   History of  GERD: Continue with PPI.   Paroxysmal atrial fibrillation (HCC): Continue with Toprol -XL. HR controlled. Eliquis  resumed.   CKD stage III A: Serum Creatinine at baseline.   History of lung cancer: Continue outpatient follow-up with Dr. Cheree Cords.   Depression: Continue Lexapro .   Benign prostatic hyperplasia with lower urinary tract symptoms: Continue treatment with Flomax . Bladder looks distended on CT abdomen, will check bladder scan and if has high-volume will insert Foley catheter.  UA consistent with UTI. Continue ceftriaxone ,  follow-up urine culture   Essential hypertension: Continue current antihypertensive agents.   COPD (chronic obstructive pulmonary disease) (HCC): No active wheezing. Continue bronchodilator management.   Tobacco abuse: Cessation counseling provided.    DVT prophylaxis: SCDs Code Status: Full code Family Communication: No Family at bedside Disposition Plan:    Status is: Inpatient Remains inpatient appropriate because: Admitted for right hip fracture, status post ORIF  POD #2.     Consultants:  Orthopeadics  Procedures: Scheduled for hardware removal ORIF and revision hemiarthroplasty Antimicrobials:  Anti-infectives (From admission, onward)    Start     Dose/Rate Route Frequency Ordered Stop   05/15/23 0845  ceFAZolin  (ANCEF ) IVPB 2g/100 mL premix        2 g 200 mL/hr over 30 Minutes Intravenous On call to O.R. 05/15/23 0749 05/15/23 1415   05/15/23 0730  cefTRIAXone  (ROCEPHIN ) 1 g in sodium chloride  0.9 % 100 mL IVPB        1 g 200 mL/hr over 30 Minutes Intravenous Every 24 hours 05/15/23 8119        Subjective: Patient was seen  and examined at bedside.  Overnight events noted. Patient reports feeling better,  pain is much improved.  Status post ORIF.  POD 2.   Objective: Vitals:   05/16/23 2036 05/17/23 0607 05/17/23 0900 05/17/23 1248  BP: (!) 114/59 120/62 132/82 112/76  Pulse: 73 70 78 74  Resp: 18 18 17 20    Temp: 98.4 F (36.9 C) 98 F (36.7 C) 98 F (36.7 C) 98.2 F (36.8 C)  TempSrc: Oral  Oral Oral  SpO2: 98% 96% 95% 97%  Weight:      Height:        Intake/Output Summary (Last 24 hours) at 05/17/2023 1321 Last data filed at 05/17/2023 0829 Gross per 24 hour  Intake 220 ml  Output 1900 ml  Net -1680 ml   Filed Weights   05/14/23 1036 05/15/23 1124  Weight: 78.9 kg 78.9 kg    Examination:  General exam: Appears calm and comfortable, not in any acute distress. Respiratory system: Clear to auscultation. Respiratory effort normal.  RR 16 Cardiovascular system: S1 & S2 heard, RRR. No JVD, murmurs, rubs, gallops or clicks.  Gastrointestinal system: Abdomen is non distended, soft and non tender. Normal bowel sounds heard. Central nervous system: Alert and oriented x 3. No focal neurological deficits. Extremities: Right hip tenderness, status post ORIF, POD #2. Skin: No rashes, lesions or ulcers Psychiatry: Judgement and insight appear normal. Mood & affect appropriate.    Data Reviewed: I have personally reviewed following labs and imaging studies  CBC: Recent Labs  Lab 05/14/23 1203 05/15/23 0521 05/16/23 0426  WBC 12.4* 7.8 9.9  NEUTROABS 11.0*  --   --   HGB 11.5* 10.5* 8.4*  HCT 34.8* 31.6* 25.8*  MCV 94.6 95.5 96.6  PLT 159 142* 136*   Basic Metabolic Panel: Recent Labs  Lab 05/14/23 1203 05/15/23 0521 05/16/23 0426  NA 134* 137 133*  K 3.5 3.7 4.1  CL 99 104 101  CO2 23 24 23   GLUCOSE 127* 111* 123*  BUN 21 19 22   CREATININE 1.43* 0.99 1.13  CALCIUM  9.1 8.8* 8.5*  MG  --   --  2.2  PHOS  --   --  3.6   GFR: Estimated Creatinine Clearance: 57.4 mL/min (by C-G formula based on SCr of 1.13 mg/dL). Liver Function Tests: Recent Labs  Lab 05/16/23 0426  AST 18  ALT 9  ALKPHOS 45  BILITOT 0.7  PROT 6.0*  ALBUMIN  2.9*   No results for input(s): "LIPASE", "AMYLASE" in the last 168 hours. No results for input(s): "AMMONIA" in the last 168  hours. Coagulation Profile: No results for input(s): "INR", "PROTIME" in the last 168 hours. Cardiac Enzymes: Recent Labs  Lab 05/14/23 1203  CKTOTAL 147   BNP (last 3 results) No results for input(s): "PROBNP" in the last 8760 hours. HbA1C: No results for input(s): "HGBA1C" in the last 72 hours. CBG: No results for input(s): "GLUCAP" in the last 168 hours. Lipid Profile: No results for input(s): "CHOL", "HDL", "LDLCALC", "TRIG", "CHOLHDL", "LDLDIRECT" in the last 72 hours. Thyroid  Function Tests: No results for input(s): "TSH", "T4TOTAL", "FREET4", "T3FREE", "THYROIDAB" in the last 72 hours. Anemia Panel: No results for input(s): "VITAMINB12", "FOLATE", "FERRITIN", "TIBC", "IRON", "RETICCTPCT" in the last 72 hours. Sepsis Labs: No results for input(s): "PROCALCITON", "LATICACIDVEN" in the last 168 hours.  Recent Results (from the past 240 hours)  MRSA Next Gen by PCR, Nasal     Status: None   Collection Time: 05/15/23  5:27 AM  Specimen: Nasal Mucosa; Nasal Swab  Result Value Ref Range Status   MRSA by PCR Next Gen NOT DETECTED NOT DETECTED Final    Comment: (NOTE) The GeneXpert MRSA Assay (FDA approved for NASAL specimens only), is one component of a comprehensive MRSA colonization surveillance program. It is not intended to diagnose MRSA infection nor to guide or monitor treatment for MRSA infections. Test performance is not FDA approved in patients less than 71 years old. Performed at [facility], [address] [place], [place] [address]     Radiology Studies: DG HIP UNILAT WITH PELVIS 2-3 VIEWS RIGHT Result Date: 05/16/2023 CLINICAL DATA:  Status post right hip hemiarthroplasty revision. EXAM: DG HIP (WITH OR WITHOUT PELVIS) 2-3V RIGHT COMPARISON:  CT of the right hip dated 05/14/2023. FINDINGS: Status post right hip hemiarthroplasty revision with cerclage wire at the level of the known intertrochanteric fracture. Hardware is intact with  appropriate alignment. No acute complication. Expected postoperative soft tissue swelling and air of the right hip. Lumbosacral fixation hardware in place. IMPRESSION: Status post right hip hemiarthroplasty revision with cerclage wire at the level of the known intertrochanteric fracture. No acute complication. Electronically Signed   By: Mannie Seek M.D.   On: 05/16/2023 09:37   Scheduled Meds:  apixaban   5 mg Oral BID   atorvastatin   80 mg Oral QHS   Chlorhexidine  Gluconate Cloth  6 each Topical Daily   chlorthalidone   25 mg Oral Daily   cholecalciferol   2,000 Units Oral Daily   docusate sodium   100 mg Oral BID   feeding supplement  237 mL Oral TID BM   folic acid   1 mg Oral Daily   magnesium  oxide  400 mg Oral Daily   metoprolol  succinate  25 mg Oral Daily   multivitamin with minerals  1 tablet Oral Daily   pantoprazole   40 mg Oral Daily   senna  1 tablet Oral BID   tamsulosin   0.4 mg Oral Daily   umeclidinium bromide   1 puff Inhalation Daily   Continuous Infusions:  cefTRIAXone  (ROCEPHIN )  IV 1 g (05/17/23 0826)     LOS: 3 days    Time spent: 35 mins    Magdalene School, MD [facility]   If 7PM-7AM, please contact night-coverage

## 2023-05-17 NOTE — Progress Notes (Signed)
 Mobility Specialist - Progress Note   05/17/23 1337  Mobility  Activity Transferred from chair to bed  Level of Assistance +2 (takes two people)  Nurse, learning disability of Motion/Exercises Active Assistive  Mobility visit 1 Mobility  Mobility Specialist Start Time (ACUTE ONLY) 1325  Mobility Specialist Stop Time (ACUTE ONLY) 1337  Mobility Specialist Time Calculation (min) (ACUTE ONLY) 12 min   NT requested assistance to transfer pt back to bed. Assisted NT with transfer and was left in bed with all needs met. Call bell in reach.  Lorna Rose Mobility Specialist

## 2023-05-17 NOTE — Progress Notes (Addendum)
 Orthopaedic Trauma Service (OTS)  2 Days Post-Op Procedure(s) (LRB): RIGHT HIP HEMIARTHROPLASTY REVISION WITH OPEN REDUCTION INTERNAL FIXATION OF PERIPROSTHETIC FEMUR FRACTURE (Right)  Subjective: Patient reports pain as  is minimal at rest. Reports urinary catheter is in because of bladder infection .    Objective: Current Vitals Blood pressure 120/62, pulse 70, temperature 98 F (36.7 C), resp. rate 18, height 5\' 10"  (1.778 m), weight 78.9 kg, SpO2 96%. Vital signs in last 24 hours: Temp:  [98 F (36.7 C)-98.4 F (36.9 C)] 98 F (36.7 C) (04/26 0607) Pulse Rate:  [70-79] 70 (04/26 0607) Resp:  [17-18] 18 (04/26 0607) BP: (108-120)/(58-67) 120/62 (04/26 0607) SpO2:  [88 %-100 %] 96 % (04/26 0607)  Intake/Output from previous day: 04/25 0701 - 04/26 0700 In: 886 [P.O.:786; IV Piggyback:100] Out: 1300 [Urine:1300]  LABS Recent Labs    05/14/23 1203 05/15/23 0521 05/16/23 0426  HGB 11.5* 10.5* 8.4*   Recent Labs    05/15/23 0521 05/16/23 0426  WBC 7.8 9.9  RBC 3.31* 2.67*  HCT 31.6* 25.8*  PLT 142* 136*   Recent Labs    05/15/23 0521 05/16/23 0426  NA 137 133*  K 3.7 4.1  CL 104 101  CO2 24 23  BUN 19 22  CREATININE 0.99 1.13  GLUCOSE 111* 123*  CALCIUM  8.8* 8.5*   No results for input(s): "LABPT", "INR" in the last 72 hours.  I do not see a UCx in the system in the last month.  Physical Exam RLE Dressing pristine, intact, clean, dry  Edema/ swelling controlled  Sens: DPN, SPN, TN intact  Motor: EHL, FHL, and lessor toe ext and flex all intact grossly  Brisk cap refill, warm to touch  Assessment/Plan: 2 Days Post-Op Procedure(s) (LRB): RIGHT HIP HEMIARTHROPLASTY REVISION WITH OPEN REDUCTION INTERNAL FIXATION OF PERIPROSTHETIC FEMUR FRACTURE (Right) 1. PT/OT if available today; o/w up to chair x 2 with nursing; WBAT 2. DVT proph Eliquis  3. Indwelling catheter with patient report of bladder infection, stable Cr 4. F/u 8-14 days  Hardy Lia, MD [facility], [facility] [phone]

## 2023-05-17 NOTE — Progress Notes (Signed)
 Physical Therapy Treatment Patient Details Name: Darryl Ward MRN: [id number] DOB: [birth year] Today's Date: 05/17/2023   History of Present Illness Pt admitted from home s/p fall with R THR periprosthetic fx and now s/p R hip hemiarthroplasty revision with ORIF of periprosthetic femur fx .  Pt with hx of R hip fx with THR 4/24, COPD, NSTEMI, CVA, TIA, lumbar fusion, bilaterl femoral artery bypass graft, COPD and AAA    PT Comments  Pt very cooperative and with improved pain control noted.  Pt performed therex program with assist and up to EOB sitting - able to balance at sup level after initial posterior drift.  Pt assisted to stand but gait distance limited by pt report of "feel so tired and getting dizzy" - pt returned to recliner with BP 132/82 - pt reports symptoms resolving with increased time in sitting - RN aware.    If plan is discharge home, recommend the following: A lot of help with walking and/or transfers;A lot of help with bathing/dressing/bathroom;Assistance with cooking/housework;Assist for transportation;Help with stairs or ramp for entrance   Can travel by private vehicle     No  Equipment Recommendations  None recommended by PT    Recommendations for Other Services       Precautions / Restrictions Precautions Precautions: Fall Precaution/Restrictions Comments: NO THP per PA when contacted by RN Restrictions Weight Bearing Restrictions Per Provider Order: No Other Position/Activity Restrictions: WBAT     Mobility  Bed Mobility Overal bed mobility: Needs Assistance Bed Mobility: Supine to Sit     Supine to sit: Mod assist, Max assist, +2 for physical assistance, +2 for safety/equipment     General bed mobility comments: Increased time with cues for sequence, assist to manage R LE and control trunk; and use of bed pad to complete rotation to EOB sitting    Transfers Overall transfer level: Needs assistance Equipment used: Rolling walker (2  wheels) Transfers: Sit to/from Stand, Bed to chair/wheelchair/BSC Sit to Stand: Min assist, Mod assist, +2 physical assistance, +2 safety/equipment, From elevated surface   Step pivot transfers: Min assist, Mod assist, +2 physical assistance, +2 safety/equipment       General transfer comment: cues for LE management and use of UEs to self assist; physical assist to bring wt up and fwd and to balance in standing with RW.  Step pvt bed to recliner    Ambulation/Gait Ambulation/Gait assistance: Min assist, Mod assist, +2 physical assistance, +2 safety/equipment Gait Distance (Feet): 2 Feet Assistive device: Rolling walker (2 wheels) Gait Pattern/deviations: Step-to pattern, Decreased step length - right, Decreased step length - left, Shuffle, Trunk flexed Gait velocity: decr     General Gait Details: Increased time with cues for sequence, posture and position from RW.  Physical assist for balance/support, RW management and to advance R LE - limited by c/o fatigue and dizziness   Stairs             Wheelchair Mobility     Tilt Bed    Modified Rankin (Stroke Patients Only)       Balance Overall balance assessment: Needs assistance Sitting-balance support: No upper extremity supported, Feet supported Sitting balance-Leahy Scale: Fair     Standing balance support: Bilateral upper extremity supported Standing balance-Leahy Scale: Poor                              Communication Communication Communication: Impaired Factors Affecting Communication: Hearing impaired  Cognition  Arousal: Alert Behavior During Therapy: WFL for tasks assessed/performed   PT - Cognitive impairments: No apparent impairments                         Following commands: Impaired Following commands impaired: Follows one step commands with increased time, Follows multi-step commands inconsistently (? hearing deficits)    Cueing Cueing Techniques: Verbal cues, Gestural  cues  Exercises Total Joint Exercises Ankle Circles/Pumps: AROM, Both, 15 reps, Supine Quad Sets: AROM, Both, 10 reps, Supine Heel Slides: AAROM, Right, 15 reps, Supine Hip ABduction/ADduction: AAROM, Right, 15 reps, Supine    General Comments        Pertinent Vitals/Pain Pain Assessment Pain Assessment: 0-10 Pain Score: 4  Pain Location: R hip with activity Pain Descriptors / Indicators: Aching, Sore Pain Intervention(s): Limited activity within patient's tolerance, Monitored during session, Premedicated before session, Ice applied    Home Living                          Prior Function            PT Goals (current goals can now be found in the care plan section) Acute Rehab PT Goals Patient Stated Goal: Rehab at [facility] to regain IND PT Goal Formulation: With patient Time For Goal Achievement: 05/29/23 Potential to Achieve Goals: Fair Progress towards PT goals: Progressing toward goals    Frequency    Min 3X/week      PT Plan      Co-evaluation              AM-PAC PT "6 Clicks" Mobility   Outcome Measure  Help needed turning from your back to your side while in a flat bed without using bedrails?: A Lot Help needed moving from lying on your back to sitting on the side of a flat bed without using bedrails?: A Lot Help needed moving to and from a bed to a chair (including a wheelchair)?: A Lot Help needed standing up from a chair using your arms (e.g., wheelchair or bedside chair)?: A Lot Help needed to walk in hospital room?: Total Help needed climbing 3-5 steps with a railing? : Total 6 Click Score: 10    End of Session Equipment Utilized During Treatment: Gait belt Activity Tolerance: Patient tolerated treatment well;Patient limited by fatigue Patient left: in chair;with call bell/phone within reach;with chair alarm set Nurse Communication: Mobility status PT Visit Diagnosis: Difficulty in walking, not elsewhere classified  (R26.2);Muscle weakness (generalized) (M62.81);Pain Pain - Right/Left: Right Pain - part of body: Hip     Time: 1610-9604 PT Time Calculation (min) (ACUTE ONLY): 30 min  Charges:    $Therapeutic Exercise: 8-22 mins $Therapeutic Activity: 8-22 mins PT General Charges $$ ACUTE PT VISIT: 1 Visit                     Thedora Finlay PT Acute Rehabilitation Services Pager [phone] Office [phone]    Josaiah Muhammed 05/17/2023, 12:24 PM

## 2023-05-18 DIAGNOSIS — S72001A Fracture of unspecified part of neck of right femur, initial encounter for closed fracture: Secondary | ICD-10-CM | POA: Diagnosis not present

## 2023-05-18 NOTE — Progress Notes (Signed)
 PROGRESS NOTE    Darryl Ward  WNU:[id number] DOB: September 23, 1945 DOA: 05/14/2023 PCP: Cook, Jayce G, DO   Brief Narrative:  This 78 yrs old male, with past medical history significant of congestive heart failure, peripheral vascular disease, CAD, hypertension, paroxysmal A-fib on Eliquis , primary adenocarcinoma of the right lung, following with Dr. 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  Patient reports fall happened when he was rolling out of the bed,  sustained a right hip pain and was unable to stand up after the fall, he usually ambulates with a cane and a walker.  Family found him 2 hours later and called the EMS.  Patient does have history of right hip surgical repair last year,  He is on Eliquis ,  the last time he took Eliquis  was yesterday evening.  Workup in the ED CT right hip significant for periprosthetic fracture.  Patient is admitted for further evaluation orthopedics is consulted. Patient is scheduled to have right hip arthroplasty today.  Assessment & Plan:   Principal Problem:   Closed right hip fracture (HCC) Active Problems:   GERD without esophagitis   Paroxysmal atrial fibrillation (HCC)   Coronary atherosclerosis of native coronary artery   COPD (chronic obstructive pulmonary disease) (HCC)   Essential hypertension   Chronic combined systolic and diastolic CHF (congestive heart failure) (HCC)   Benign prostatic hyperplasia with lower urinary tract symptoms   Depression   Malnutrition of moderate degree  Right hip fracture: Patient sustained a fall from the bed when he was rolling over,  Imaging significant for periprostatic right hip fracture. Orthopedic consulted.  S/p successful ORIF POD # 3 Adequate pain control with pain medicines, antinausea medication. Held Eliquis  in anticipation of surgery. PT and OT recommended SNF. TOC looking for SNF options. Eliquis  resumed.   History of CVA: Eliquis   resumed.   History of GERD: Continue with PPI.   Paroxysmal atrial fibrillation (HCC): Continue with Toprol -XL. HR controlled. Eliquis  resumed.   CKD stage III A: Serum Creatinine at baseline.   History of lung cancer: Continue outpatient follow-up with Dr. Cheree Cords.   Depression: Continue Lexapro .   Benign prostatic hyperplasia with lower urinary tract symptoms: Continue treatment with Flomax . Bladder looks distended on CT abdomen, will check bladder scan and if has high-volume will insert Foley catheter.  UA consistent with UTI. Continue ceftriaxone , urine culture not sent. Start voiding trial for Foley catheterization.   Essential hypertension: Continue current antihypertensive agents.   COPD (chronic obstructive pulmonary disease) (HCC): No active wheezing. Continue bronchodilator management.   Tobacco abuse: Cessation counseling provided.    DVT prophylaxis: SCDs Code Status: Full code Family Communication: No Family at bedside Disposition Plan:    Status is: Inpatient Remains inpatient appropriate because: Admitted for right hip fracture, status post ORIF  POD #3. Anticipated  discharge to SNF tomorrow.     Consultants:  Orthopeadics  Procedures: Scheduled for hardware removal ORIF and revision hemiarthroplasty Antimicrobials:  Anti-infectives (From admission, onward)    Start     Dose/Rate Route Frequency Ordered Stop   05/15/23 0845  ceFAZolin  (ANCEF ) IVPB 2g/100 mL premix        2 g 200 mL/hr over 30 Minutes Intravenous On call to O.R. 05/15/23 0749 05/15/23 1415   05/15/23 0730  cefTRIAXone  (ROCEPHIN ) 1 g in sodium chloride  0.9 % 100 mL IVPB        1 g 200 mL/hr over 30 Minutes Intravenous Every 24  hours 05/15/23 1610        Subjective: Patient was seen and examined at bedside.  Overnight events noted. Patient reports feeling better, pain is much improved.  S/p ORIF POD 3.  Objective: Vitals:   05/17/23 2023 05/18/23 0423 05/18/23 0836  05/18/23 0914  BP: 119/65 (!) 122/58  108/61  Pulse: 81 86    Resp: 16     Temp: 100.3 F (37.9 C) 99.3 F (37.4 C)    TempSrc: Oral Oral    SpO2: 92% 98% 97%   Weight:      Height:        Intake/Output Summary (Last 24 hours) at 05/18/2023 1057 Last data filed at 05/18/2023 9604 Gross per 24 hour  Intake --  Output 1626 ml  Net -1626 ml   Filed Weights   05/14/23 1036 05/15/23 1124  Weight: 78.9 kg 78.9 kg    Examination:  General exam: Appears calm and comfortable, not in any acute distress. Respiratory system: Clear to auscultation. Respiratory effort normal.  RR 16 Cardiovascular system: S1 & S2 heard, RRR. No JVD, murmurs, rubs, gallops or clicks.  Gastrointestinal system: Abdomen is non distended, soft and non tender. Normal bowel sounds heard. Central nervous system: Alert and oriented x 3. No focal neurological deficits. Extremities: Right hip tenderness, status post ORIF, POD #  3. Skin: No rashes, lesions or ulcers Psychiatry: Judgement and insight appear normal. Mood & affect appropriate.    Data Reviewed: I have personally reviewed following labs and imaging studies  CBC: Recent Labs  Lab 05/14/23 1203 05/15/23 0521 05/16/23 0426  WBC 12.4* 7.8 9.9  NEUTROABS 11.0*  --   --   HGB 11.5* 10.5* 8.4*  HCT 34.8* 31.6* 25.8*  MCV 94.6 95.5 96.6  PLT 159 142* 136*   Basic Metabolic Panel: Recent Labs  Lab 05/14/23 1203 05/15/23 0521 05/16/23 0426  NA 134* 137 133*  K 3.5 3.7 4.1  CL 99 104 101  CO2 23 24 23   GLUCOSE 127* 111* 123*  BUN 21 19 22   CREATININE 1.43* 0.99 1.13  CALCIUM  9.1 8.8* 8.5*  MG  --   --  2.2  PHOS  --   --  3.6   GFR: Estimated Creatinine Clearance: 57.4 mL/min (by C-G formula based on SCr of 1.13 mg/dL). Liver Function Tests: Recent Labs  Lab 05/16/23 0426  AST 18  ALT 9  ALKPHOS 45  BILITOT 0.7  PROT 6.0*  ALBUMIN  2.9*   No results for input(s): "LIPASE", "AMYLASE" in the last 168 hours. No results for  input(s): "AMMONIA" in the last 168 hours. Coagulation Profile: No results for input(s): "INR", "PROTIME" in the last 168 hours. Cardiac Enzymes: Recent Labs  Lab 05/14/23 1203  CKTOTAL 147   BNP (last 3 results) No results for input(s): "PROBNP" in the last 8760 hours. HbA1C: No results for input(s): "HGBA1C" in the last 72 hours. CBG: No results for input(s): "GLUCAP" in the last 168 hours. Lipid Profile: No results for input(s): "CHOL", "HDL", "LDLCALC", "TRIG", "CHOLHDL", "LDLDIRECT" in the last 72 hours. Thyroid  Function Tests: No results for input(s): "TSH", "T4TOTAL", "FREET4", "T3FREE", "THYROIDAB" in the last 72 hours. Anemia Panel: No results for input(s): "VITAMINB12", "FOLATE", "FERRITIN", "TIBC", "IRON", "RETICCTPCT" in the last 72 hours. Sepsis Labs: No results for input(s): "PROCALCITON", "LATICACIDVEN" in the last 168 hours.  Recent Results (from the past 240 hours)  MRSA Next Gen by PCR, Nasal     Status: None   Collection Time: 05/15/23  5:27 AM   Specimen: Nasal Mucosa; Nasal Swab  Result Value Ref Range Status   MRSA by PCR Next Gen NOT DETECTED NOT DETECTED Final    Comment: (NOTE) The GeneXpert MRSA Assay (FDA approved for NASAL specimens only), is one component of a comprehensive MRSA colonization surveillance program. It is not intended to diagnose MRSA infection nor to guide or monitor treatment for MRSA infections. Test performance is not FDA approved in patients less than 11 years old. Performed at [facility], [address], [place], [place] [address]     Radiology Studies: No results found.  Scheduled Meds:  apixaban   5 mg Oral BID   atorvastatin   80 mg Oral QHS   Chlorhexidine  Gluconate Cloth  6 each Topical Daily   chlorthalidone   25 mg Oral Daily   cholecalciferol   2,000 Units Oral Daily   docusate sodium   100 mg Oral BID   feeding supplement  237 mL Oral TID BM   folic acid   1 mg Oral Daily   magnesium  oxide   400 mg Oral Daily   metoprolol  succinate  25 mg Oral Daily   multivitamin with minerals  1 tablet Oral Daily   pantoprazole   40 mg Oral Daily   senna  1 tablet Oral BID   tamsulosin   0.4 mg Oral Daily   umeclidinium bromide   1 puff Inhalation Daily   Continuous Infusions:  cefTRIAXone  (ROCEPHIN )  IV 1 g (05/18/23 0644)     LOS: 4 days    Time spent: 35 mins    Magdalene School, MD [facility]   If 7PM-7AM, please contact night-coverage

## 2023-05-18 NOTE — Progress Notes (Signed)
 Foley removed for voiding trial per MD. Pt with no BM since 4/23. Scheduled stool softeners given in addition to PRN miralax . PT scheduled to see the patient this morning. PRN 5mg  oxy given for 7/10 hip pain. BP currently 108/61 lying, MD aware, verbal orders given to hold daily dose of 25mg  metoprolol .

## 2023-05-18 NOTE — Plan of Care (Signed)
  Problem: Clinical Measurements: Goal: Diagnostic test results will improve Outcome: Progressing Goal: Respiratory complications will improve Outcome: Progressing Goal: Cardiovascular complication will be avoided Outcome: Progressing   Problem: Activity: Goal: Risk for activity intolerance will decrease Outcome: Progressing   Problem: Coping: Goal: Level of anxiety will decrease Outcome: Progressing   Problem: Pain Managment: Goal: General experience of comfort will improve and/or be controlled Outcome: Progressing   Problem: Safety: Goal: Ability to remain free from injury will improve Outcome: Progressing

## 2023-05-18 NOTE — Progress Notes (Signed)
 Physical Therapy Treatment Patient Details Name: Darryl Ward MRN: [id number] DOB: 05/03/1945 Today's Date: 05/18/2023   History of Present Illness Pt admitted from home s/p fall with R THR periprosthetic fx and now s/p R hip hemiarthroplasty revision with ORIF of periprosthetic femur fx .  Pt with hx of R hip fx with THR 4/24, COPD, NSTEMI, CVA, TIA, lumbar fusion, bilaterl femoral artery bypass graft, COPD and AAA    PT Comments  Pt continues cooperative but progressing slowly and requiring increased time for all tasks.  This date, pt with increasingly delayed responses and decreased follow through with commands noted while up with RW. BP supine 129/63; sit 109/88; after step pvt to recliner 102/63 - pt denies dizziness, admitting to fatigue only - RN aware.  Patient will benefit from continued inpatient follow up therapy, <3 hours/day.     If plan is discharge home, recommend the following: A lot of help with walking and/or transfers;A lot of help with bathing/dressing/bathroom;Assistance with cooking/housework;Assist for transportation;Help with stairs or ramp for entrance   Can travel by private vehicle     No  Equipment Recommendations  None recommended by PT    Recommendations for Other Services       Precautions / Restrictions Precautions Precautions: Fall Precaution/Restrictions Comments: NO THP per PA when contacted by RN Restrictions Weight Bearing Restrictions Per Provider Order: No Other Position/Activity Restrictions: WBAT     Mobility  Bed Mobility Overal bed mobility: Needs Assistance Bed Mobility: Supine to Sit     Supine to sit: Mod assist, +2 for physical assistance, +2 for safety/equipment     General bed mobility comments: Increased time with cues for sequence, assist to manage R LE and control trunk; and use of bed pad to complete rotation to EOB sitting    Transfers Overall transfer level: Needs assistance Equipment used: Rolling walker (2  wheels) Transfers: Sit to/from Stand, Bed to chair/wheelchair/BSC Sit to Stand: Min assist, Mod assist, +2 physical assistance, +2 safety/equipment, From elevated surface   Step pivot transfers: Min assist, Mod assist, +2 physical assistance, +2 safety/equipment       General transfer comment: cues for LE management and use of UEs to self assist; physical assist to bring wt up and fwd and to balance in standing with RW.  Step pvt bed to recliner    Ambulation/Gait               General Gait Details: tolerated step/pvt only and transferred to chair   Stairs             Wheelchair Mobility     Tilt Bed    Modified Rankin (Stroke Patients Only)       Balance Overall balance assessment: Needs assistance Sitting-balance support: No upper extremity supported, Feet supported Sitting balance-Leahy Scale: Fair     Standing balance support: Bilateral upper extremity supported Standing balance-Leahy Scale: Poor                              Communication Communication Communication: Impaired Factors Affecting Communication: Hearing impaired  Cognition Arousal: Alert Behavior During Therapy: WFL for tasks assessed/performed   PT - Cognitive impairments: No apparent impairments                         Following commands: Impaired Following commands impaired: Follows one step commands with increased time, Follows multi-step commands inconsistently  Cueing Cueing Techniques: Verbal cues, Gestural cues  Exercises      General Comments        Pertinent Vitals/Pain Pain Assessment Pain Assessment: 0-10 Pain Score: 4  Pain Location: R hip with activity Pain Descriptors / Indicators: Aching, Sore Pain Intervention(s): Limited activity within patient's tolerance, Monitored during session, Premedicated before session, Ice applied    Home Living                          Prior Function            PT Goals (current goals  can now be found in the care plan section) Acute Rehab PT Goals Patient Stated Goal: Rehab at [facility] to regain IND PT Goal Formulation: With patient Time For Goal Achievement: 05/29/23 Potential to Achieve Goals: Fair Progress towards PT goals: Not progressing toward goals - comment (orthostatic)    Frequency    Min 3X/week      PT Plan      Co-evaluation              AM-PAC PT "6 Clicks" Mobility   Outcome Measure  Help needed turning from your back to your side while in a flat bed without using bedrails?: A Lot Help needed moving from lying on your back to sitting on the side of a flat bed without using bedrails?: A Lot Help needed moving to and from a bed to a chair (including a wheelchair)?: A Lot Help needed standing up from a chair using your arms (e.g., wheelchair or bedside chair)?: A Lot Help needed to walk in hospital room?: Total Help needed climbing 3-5 steps with a railing? : Total 6 Click Score: 10    End of Session Equipment Utilized During Treatment: Gait belt Activity Tolerance: Patient tolerated treatment well;Patient limited by fatigue Patient left: in chair;with call bell/phone within reach;with chair alarm set Nurse Communication: Mobility status PT Visit Diagnosis: Difficulty in walking, not elsewhere classified (R26.2);Muscle weakness (generalized) (M62.81);Pain Pain - Right/Left: Right Pain - part of body: Hip     Time: 1013-1040 PT Time Calculation (min) (ACUTE ONLY): 27 min  Charges:    $Therapeutic Activity: 23-37 mins PT General Charges $$ ACUTE PT VISIT: 1 Visit                     Thedora Finlay PT Acute Rehabilitation Services Pager [phone] Office [phone]    Arloa Prak 05/18/2023, 12:28 PM

## 2023-05-18 NOTE — Progress Notes (Signed)
 [facility] (OTS)  3 Days Post-Op Procedure(s) (LRB): RIGHT HIP HEMIARTHROPLASTY REVISION WITH OPEN REDUCTION INTERNAL FIXATION OF PERIPROSTHETIC FEMUR FRACTURE (Right)  Subjective: Patient reports pain as  improved .  No BM yet, + flatus.  Objective: Current Vitals Blood pressure (!) 122/58, pulse 86, temperature 99.3 F (37.4 C), temperature source Oral, resp. rate 16, height 5\' 10"  (1.778 m), weight 78.9 kg, SpO2 97%. Vital signs in last 24 hours: Temp:  [98 F (36.7 C)-100.3 F (37.9 C)] 99.3 F (37.4 C) (04/27 0423) Pulse Rate:  [74-86] 86 (04/27 0423) Resp:  [16-20] 16 (04/26 2023) BP: (112-132)/(58-82) 122/58 (04/27 0423) SpO2:  [92 %-98 %] 97 % (04/27 0836)  Intake/Output from previous day: 04/26 0701 - 04/27 0700 In: -  Out: 2576 [Urine:2576]  LABS Recent Labs    05/16/23 0426  HGB 8.4*   Recent Labs    05/16/23 0426  WBC 9.9  RBC 2.67*  HCT 25.8*  PLT 136*   Recent Labs    05/16/23 0426  NA 133*  K 4.1  CL 101  CO2 23  BUN 22  CREATININE 1.13  GLUCOSE 123*  CALCIUM  8.5*   No results for input(s): "LABPT", "INR" in the last 72 hours.   Physical Exam RLE Dressing pristine, intact, clean, dry             Edema/ swelling controlled             Sens: DPN, SPN, TN intact             Motor: EHL, FHL, and lessor toe ext and flex all intact grossly             Brisk cap refill, warm to touch  Assessment/Plan: 3 Days Post-Op Procedure(s) (LRB): RIGHT HIP HEMIARTHROPLASTY REVISION WITH OPEN REDUCTION INTERNAL FIXATION OF PERIPROSTHETIC FEMUR FRACTURE (Right) 1. PT/OT making progress; WBAT 2. DVT proph Eliquis  3. Indwelling catheter per IM; advance bowel regimen 4. F/u 8-14 days  Hardy Lia, MD [facility], [facility] [phone]

## 2023-05-18 NOTE — Progress Notes (Signed)
 Mobility Specialist - Progress Note   05/18/23 1409  Mobility  Activity Transferred from chair to bed  Level of Assistance +2 (takes two people)  Assistive Device [employer]/Exercises Active Assistive  Activity Response Tolerated fair  Mobility visit 1 Mobility  Mobility Specialist Start Time (ACUTE ONLY) 1359  Mobility Specialist Stop Time (ACUTE ONLY) 1409  Mobility Specialist Time Calculation (min) (ACUTE ONLY) 10 min   RN requested assistance to transfer pt to bed. Was left in bed with all needs met. Call bell in reach.  Lorna Rose Mobility Specialist

## 2023-05-19 ENCOUNTER — Other Ambulatory Visit: Payer: Self-pay

## 2023-05-19 ENCOUNTER — Encounter: Payer: Self-pay | Admitting: Hematology

## 2023-05-19 DIAGNOSIS — S72001A Fracture of unspecified part of neck of right femur, initial encounter for closed fracture: Secondary | ICD-10-CM | POA: Diagnosis not present

## 2023-05-19 LAB — CBC
HCT: 26.9 % — ABNORMAL LOW (ref 39.0–52.0)
Hemoglobin: 8.7 g/dL — ABNORMAL LOW (ref 13.0–17.0)
MCH: 31.6 pg (ref 26.0–34.0)
MCHC: 32.3 g/dL (ref 30.0–36.0)
MCV: 97.8 fL (ref 80.0–100.0)
Platelets: 190 10*3/uL (ref 150–400)
RBC: 2.75 MIL/uL — ABNORMAL LOW (ref 4.22–5.81)
RDW: 14.3 % (ref 11.5–15.5)
WBC: 9.1 10*3/uL (ref 4.0–10.5)
nRBC: 0 % (ref 0.0–0.2)

## 2023-05-19 MED ORDER — OXYCODONE HCL 5 MG PO TABS: 5.0000 mg | ORAL_TABLET | ORAL | 0 refills | Status: DC | PRN

## 2023-05-19 MED ORDER — DOCUSATE SODIUM 100 MG PO CAPS
100.0000 mg | ORAL_CAPSULE | Freq: Two times a day (BID) | ORAL | 0 refills | Status: DC
  Filled 2023-05-19: qty 10, 5d supply, fill #0

## 2023-05-19 MED ORDER — METHOCARBAMOL 500 MG PO TABS
500.0000 mg | ORAL_TABLET | Freq: Four times a day (QID) | ORAL | 0 refills | Status: AC | PRN
  Filled 2023-05-19: qty 60, 15d supply, fill #0

## 2023-05-19 NOTE — Progress Notes (Signed)
 Subjective: Patient reports pain as moderate.  Tolerating diet.  Urinating.   No CP, SOB.  Has mobilized OOB with PT.  Objective:   VITALS:   Vitals:   05/18/23 1928 05/19/23 0316 05/19/23 0904 05/19/23 1224  BP: 119/68 103/67  (!) 120/90  Pulse: 95 82  (!) 101  Resp: 19 19  17   Temp: 99.5 F (37.5 C) 99 F (37.2 C)  97.6 F (36.4 C)  TempSrc: Oral Oral  Oral  SpO2: 95% 96% 95% 99%  Weight:      Height:          Latest Ref Rng & Units 05/19/2023    4:39 AM 05/16/2023    4:26 AM 05/15/2023    5:21 AM  CBC  WBC 4.0 - 10.5 K/uL 9.1  9.9  7.8   Hemoglobin 13.0 - 17.0 g/dL 8.7  8.4  16.1   Hematocrit 39.0 - 52.0 % 26.9  25.8  31.6   Platelets 150 - 400 K/uL 190  136  142       Latest Ref Rng & Units 05/16/2023    4:26 AM 05/15/2023    5:21 AM 05/14/2023   12:03 PM  BMP  Glucose 70 - 99 mg/dL 096  045  409   BUN 8 - 23 mg/dL 22  19  21    Creatinine 0.61 - 1.24 mg/dL 8.11  9.14  7.82   Sodium 135 - 145 mmol/L 133  137  134   Potassium 3.5 - 5.1 mmol/L 4.1  3.7  3.5   Chloride 98 - 111 mmol/L 101  104  99   CO2 22 - 32 mmol/L 23  24  23    Calcium  8.9 - 10.3 mg/dL 8.5  8.8  9.1    Intake/Output      04/27 0701 04/28 0700 04/28 0701 04/29 0700   P.O. 240    Total Intake(mL/kg) 240 (3)    Urine (mL/kg/hr) 275 (0.1)    Total Output 275    Net -35         Urine Occurrence 1 x       Physical Exam: General: NAD. Sitting up in bed, eating lunch, calm, comfortable Resp: No increased wob Cardio: regular rate and rhythm ABD soft Neurologically intact MSK Neurovascularly intact Sensation intact distally Intact pulses distally Dorsiflexion/Plantar flexion intact Incision: dressing C/D/I Ice to R hip  Assessment: 4 Days Post-Op  S/P Procedure(s) (LRB): RIGHT HIP HEMIARTHROPLASTY REVISION WITH OPEN REDUCTION INTERNAL FIXATION OF PERIPROSTHETIC FEMUR FRACTURE (Right) by Dr. Deloras Fess. Abigail Abler on 05/15/23  Principal Problem:   Closed right hip fracture  (HCC) Active Problems:   Coronary atherosclerosis of native coronary artery   COPD (chronic obstructive pulmonary disease) (HCC)   Essential hypertension   Chronic combined systolic and diastolic CHF (congestive heart failure) (HCC)   Benign prostatic hyperplasia with lower urinary tract symptoms   Paroxysmal atrial fibrillation (HCC)   GERD without esophagitis   Depression   Malnutrition of moderate degree   Plan:  Advance diet Up with therapy Incentive Spirometry Elevate and Apply ice  Weightbearing: WBAT RLE Insicional and dressing care: Dressings left intact until follow-up and Reinforce dressings as needed Orthopedic device(s): None Showering: Keep dressing dry VTE prophylaxis:  Eliquis  restarted  , SCDs, ambulation Pain control: PRN Follow - up plan: 2 weeks postop Contact information:  Randal Bury MD, Marzella Solan PA-C  Dispo:  PT recommends SNF. Awaiting bed placement.     Joene Gelder M Rosann Gorum,  PA-C Office [phone] 05/19/2023, 4:45 PM

## 2023-05-19 NOTE — TOC Progression Note (Addendum)
 Transition of Care [facility]) - Progression Note    Patient Details  Name: Darryl Ward MRN: [id number] Date of Birth: 02-Jun-1945  [facility] CM/SW Contact  Whitley Strycharz, Thersia Flax, RN Phone Number: 05/19/2023, 9:29 AM  Clinical Narrative: MD aware of stable will start auth for [name] have accepted) await response.  -9:34a initiated auth w/HTA left vm await auth for [facility].     Expected Discharge Plan: Skilled Nursing Facility Barriers to Discharge: Continued Medical Work up  Expected Discharge Plan and Services   Discharge Planning Services: CM Consult Post Acute Care Choice: Skilled Nursing Facility Living arrangements for the past 2 months: Single Family Home                                       Social Determinants of Health (SDOH) Interventions SDOH Screenings   Food Insecurity: No Food Insecurity (05/14/2023)  Housing: Low Risk  (05/14/2023)  Transportation Needs: No Transportation Needs (05/14/2023)  Utilities: Not At Risk (05/14/2023)  Alcohol Screen: Low Risk  (08/30/2022)  Depression (PHQ2-9): High Risk (04/25/2023)  Financial Resource Strain: High Risk (08/30/2022)  Physical Activity: Insufficiently Active (08/30/2022)  Social Connections: Moderately Isolated (05/14/2023)  Stress: No Stress Concern Present (08/30/2022)  Tobacco Use: Medium Risk (05/15/2023)  Health Literacy: Adequate Health Literacy (08/30/2022)    Readmission Risk Interventions    03/13/2023   11:54 AM  Readmission Risk Prevention Plan  Transportation Screening Complete  HRI or Home Care Consult Complete  Social Work Consult for Recovery Care Planning/Counseling Complete  Palliative Care Screening Not Applicable  Medication Review Oceanographer) Complete

## 2023-05-19 NOTE — Discharge Summary (Incomplete)
 Physician Discharge Summary  Darryl Ward UJW:[id number] DOB: 1945-09-21 DOA: 05/14/2023  PCP: Cook, Jayce G, DO  Admit date: 05/14/2023  Discharge date: 05/20/2023  Admitted From: Home  Disposition:  SNF  Recommendations for Outpatient Follow-up:  Follow up with PCP in 1-2 weeks. Please obtain BMP/CBC in one week. Advised to follow-up with orthopedics as scheduled. Patient is being discharged to skilled nursing facility for rehab. Patient has completed antibiotics course for UTI.  Home Health: None Equipment/Devices:None  Discharge Condition: Stable CODE STATUS:Full code Diet recommendation: Heart Healthy   Brief [facility] Course: This 78 yrs old male, with past medical history significant of congestive heart failure, peripheral vascular disease, CAD, hypertension, paroxysmal A-fib on Eliquis , primary adenocarcinoma of the right lung, following with Dr. 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 Patient reports fall happened when he was rolling out of the bed, sustained a right hip pain and was unable to stand up after the fall, he usually ambulates with a cane and a walker. Family found him 2 hours later and called the EMS. Patient does have history of right hip surgical repair last year, He is on Eliquis , the last time he took Eliquis  was yesterday evening. Workup in the ED CT right hip significant for periprosthetic fracture. Patient is admitted for further evaluation,  orthopedics is consulted. Patient underwent successful right hip hemiarthroplasty.  Patient has made significant improvement throughout hospital course. Orthopedics signed off. PT and OT recommended skilled nursing facility. Patient is being discharged to SNF.  Advised weightbearing as tolerated.   Discharge Diagnoses:  Principal Problem:   Closed right hip fracture (HCC) Active Problems:   GERD without esophagitis   Paroxysmal atrial fibrillation  (HCC)   Coronary atherosclerosis of native coronary artery   COPD (chronic obstructive pulmonary disease) (HCC)   Essential hypertension   Chronic combined systolic and diastolic CHF (congestive heart failure) (HCC)   Benign prostatic hyperplasia with lower urinary tract symptoms   Depression   Malnutrition of moderate degree  Right hip fracture: Patient sustained a fall from the bed when he was rolling over,  Imaging significant for periprostatic right hip fracture. Orthopedic consulted.  S/p successful ORIF POD # 4 Adequate pain control with pain medicines, antinausea medication. Held Eliquis  in anticipation of surgery. PT and OT recommended SNF. TOC looking for SNF options. Eliquis  resumed.  Insurance authorization approved. Patient being discharged to nursing home.   History of CVA: Continue Eliquis .   History of GERD: Continue with PPI.   Paroxysmal atrial fibrillation (HCC): Continue with Toprol -XL. HR controlled. Continue Eliquis     CKD stage III A: Serum Creatinine at baseline.   History of lung cancer: Continue outpatient follow-up with Dr. Cheree Cords.   Depression: Continue Lexapro .   Benign prostatic hyperplasia with lower urinary tract symptoms: Continue treatment with Flomax . Bladder looks distended on CT abdomen, will check bladder scan and if has high-volume,  will insert Foley catheter.   UA consistent with UTI. Continue ceftriaxone , urine culture not sent. Start voiding trial for Foley catheterization.  Successfully voided.  Foley catheter removed.   Essential hypertension: Continue current antihypertensive agents.   COPD (chronic obstructive pulmonary disease) (HCC): No active wheezing. Continue bronchodilator management.   Tobacco abuse: Cessation counseling provided.  Discharge Instructions  Discharge Instructions     Call MD for:  difficulty breathing, headache or visual disturbances   Complete by: As directed    Call MD for:   persistant  dizziness or light-headedness   Complete by: As directed    Call MD for:  persistant nausea and vomiting   Complete by: As directed    Diet - low sodium heart healthy   Complete by: As directed    Diet Carb Modified   Complete by: As directed    Discharge instructions   Complete by: As directed    Advised to follow-up with primary care physician in 1 week. Advised to follow-up with orthopedics as scheduled. Patient is being discharged to skilled nursing facility for rehab. Patient has completed antibiotic course for UTI.   Increase activity slowly   Complete by: As directed       Allergies as of 05/20/2023   No Known Allergies      Medication List     STOP taking these medications    ALPRAZolam  0.5 MG tablet Commonly known as: XANAX    HYDROcodone -acetaminophen  5-325 MG tablet Commonly known as: NORCO/VICODIN       TAKE these medications    albuterol  108 (90 Base) MCG/ACT inhaler Commonly known as: VENTOLIN  HFA Inhale 2 puffs into the lungs every 6 (six) hours as needed for wheezing or shortness of breath.   atorvastatin  80 MG tablet Commonly known as: LIPITOR  Take 1 tablet (80 mg total) by mouth daily.   chlorthalidone  25 MG tablet Commonly known as: HYGROTON  Take 1 tablet (25 mg total) by mouth daily.   Cholecalciferol  50 MCG (2000 UT) Caps Take 1 capsule by mouth daily.   cyclobenzaprine  10 MG tablet Commonly known as: FLEXERIL  Take 1 tablet (10 mg total) by mouth 2 (two) times daily as needed. What changed: reasons to take this   docusate sodium  100 MG capsule Commonly known as: COLACE Take 1 capsule (100 mg total) by mouth 2 (two) times daily.   Eliquis  5 MG Tabs tablet Generic drug: apixaban  Take 1 tablet (5 mg total) by mouth 2 (two) times daily.   escitalopram  10 MG tablet Commonly known as: LEXAPRO  Take 1 tablet (10 mg total) by mouth daily.   folic acid  1 MG tablet Commonly known as: KP Folic Acid  Take 1 tablet (1 mg total)  by mouth daily.   furosemide  20 MG tablet Commonly known as: LASIX  Take 1 tablet (20 mg total) by mouth daily as needed. What changed: reasons to take this   Incruse Ellipta  62.5 MCG/ACT Aepb Generic drug: umeclidinium bromide  Inhale 1 puff into the lungs daily.   magnesium  oxide 400 (240 Mg) MG tablet Commonly known as: MAG-OX Take 1 tablet (400 mg total) by mouth daily.   meclizine  25 MG tablet Commonly known as: ANTIVERT  Take 1 tablet (25 mg total) by mouth 3 (three) times daily as needed for dizziness.   methocarbamol  500 MG tablet Commonly known as: ROBAXIN  Take 1 tablet (500 mg total) by mouth every 6 (six) hours as needed for up to 15 days for muscle spasms.   metoprolol  succinate 25 MG 24 hr tablet Commonly known as: TOPROL -XL Take 1 tablet (25 mg total) by mouth daily.   oxyCODONE  5 MG immediate release tablet Commonly known as: Oxy IR/ROXICODONE  Take 1 tablet (5 mg total) by mouth every 4 (four) hours as needed for up to 3 days for severe pain (pain score 7-10).   pantoprazole  40 MG tablet Commonly known as: PROTONIX  Take 1 tablet (40 mg total) by mouth daily.   tamsulosin  0.4 MG Caps capsule Commonly known as: FLOMAX  Take 1 capsule (0.4 mg total) by mouth daily.  Follow-up Information     Saundra Curl, MD. Schedule an appointment as soon as possible for a visit in 2 week(s).   Specialty: Orthopedic Surgery Contact information: [address] [place] [address] [phone]         Cook, Jayce G, DO Follow up in 1 week(s).   Specialty: Family Medicine Contact information: [address] [place] [address] [phone]                No Known Allergies  Consultations: Orthopeadics   Procedures/Studies: DG HIP UNILAT WITH PELVIS 2-3 VIEWS RIGHT Result Date: 05/16/2023 CLINICAL DATA:  Status post right hip hemiarthroplasty revision. EXAM: DG HIP (WITH OR WITHOUT PELVIS) 2-3V RIGHT  COMPARISON:  CT of the right hip dated 05/14/2023. FINDINGS: Status post right hip hemiarthroplasty revision with cerclage wire at the level of the known intertrochanteric fracture. Hardware is intact with appropriate alignment. No acute complication. Expected postoperative soft tissue swelling and air of the right hip. Lumbosacral fixation hardware in place. IMPRESSION: Status post right hip hemiarthroplasty revision with cerclage wire at the level of the known intertrochanteric fracture. No acute complication. Electronically Signed   By: Mannie Seek M.D.   On: 05/16/2023 09:37   MR LUMBAR SPINE WO CONTRAST Result Date: 05/14/2023 Table formatting from the original result was not included. [facility] NEUROIMAGING REPORT STUDY DATE: 05/13/23 PATIENT NAME: KAJ TODOROFF DOB: 11/08/1945 MRN: [id number] ORDERING CLINICIAN: Phebe Brasil, MD CLINICAL HISTORY: 78 y.o. year old male with: 1. Cerebrovascular accident (CVA), unspecified mechanism (HCC)  2. Gait abnormality  3. Urinary incontinence, unspecified type   EXAM: MR LUMBAR SPINE WO CONTRAST TECHNIQUE: MRI of the lumbar spine was obtained utilizing multiplanar, multiecho pulse sequences. CONTRAST: Diagnostic Product Medications (last 72 hours)   None   COMPARISON: none IMAGING SITE: [facility] AT [address] [place] [address] FINDINGS: On sagittal views the vertebral bodies have normal height and alignment.  Posterior lumbar decompression and fusion with pedicle screws and interbody fusion device spanning L5 and S1 levels.  The conus medullaris terminates at the level of L1.  On axial views: T12-L1: No spinal stenosis or foraminal narrowing L1-2: Disc bulging and facet hypertrophy with mild bilateral foraminal stenosis L2-3: Disc bulging and facet hypertrophy with mild bilateral foraminal stenosis L3-4: Disc bulging and facet hypertrophy with mild bilateral foraminal stenosis  L4-5: Disc bulging and facet operatory with moderate to severe bilateral foraminal stenosis L5-S1: Disc bulging and posterior decompression, with moderate bilateral foraminal stenosis Limited views of the aorta, kidneys, iliopsoas muscles and sacroiliac joints are notable for bilateral renal cysts ranging from 0.6 to 3.0 cm in diameter.   MRI lumbar spine (without) demonstrating: - At L4-5: Disc bulging and facet hypertrophy with moderate to severe bilateral foraminal stenosis. - At L5-S1: Disc bulging and posterior decompression, with moderate bilateral foraminal stenosis; posterior lumbar decompression and fusion with metal hardware. - Bilateral renal cysts. INTERPRETING PHYSICIAN: Omega Bible, MD Certified in Neurology, Neurophysiology and Neuroimaging [facility] [address] [place], [place] [address] [phone]  MR CERVICAL SPINE WO CONTRAST Result Date: 05/14/2023 Table formatting from the original result was not included. [facility] NEUROIMAGING REPORT STUDY DATE: 05/13/23 PATIENT NAME: DONNAVAN BACINO DOB: 12/17/1945 MRN: [id number] ORDERING CLINICIAN: Phebe Brasil, MD CLINICAL HISTORY: 77 y.o. year old male with: 1. Cerebrovascular accident (CVA), unspecified mechanism (HCC)  2. Gait abnormality  3.  Urinary incontinence, unspecified type   EXAM: MR CERVICAL SPINE WO CONTRAST TECHNIQUE: MRI of the cervical spine was obtained utilizing multiplanar, multiecho pulse sequences. CONTRAST: Diagnostic Product Medications (last 72 hours)   None   COMPARISON: 02/26/23 CT IMAGING SITE: [facility] AT [address] [place] [address] FINDINGS: On sagittal views the vertebral bodies have normal height and alignment.  The spinal cord is normal in size and appearance. The posterior fossa, pituitary gland and paraspinal soft tissues are unremarkable.  On axial views: C2-3 no spinal stenosis or foraminal  narrowing C3-4 no spinal stenosis or foraminal narrowing C4-5 small posterior disc protrusion and facet hypertrophy with mild spinal stenosis and no foraminal narrowing C5-6 disc bulging and facet hypertrophy with moderate spinal stenosis and severe bilateral foraminal stenosis C6-7 disc bulging and facet hypertrophy with moderate spinal stenosis and mild bilateral foraminal stenosis C7-T1 no spinal stenosis or foraminal narrowing T1-2 no spinal stenosis or foraminal narrowing Limited views of the soft tissues of the head and neck are unremarkable.   MRI cervical spine without contrast demonstrating: - At C5-6 disc bulging and facet hypertrophy with moderate spinal stenosis and severe bilateral foraminal stenosis. - At C6-7 disc bulging and facet hypertrophy with moderate spinal stenosis and mild bilateral foraminal stenosis. - No cord signal abnormalities. INTERPRETING PHYSICIAN: Omega Bible, MD Certified in Neurology, Neurophysiology and Neuroimaging [facility] [address], [address] [place], [place] [address] [phone]  CT Hip Right Wo Contrast Result Date: 05/14/2023 CLINICAL DATA:  Right hip pain after fall. Concern for periprosthetic fracture. EXAM: CT OF THE RIGHT HIP WITHOUT CONTRAST TECHNIQUE: Multidetector CT imaging of the right hip was performed according to the standard protocol. Multiplanar CT image reconstructions were also generated. RADIATION DOSE REDUCTION: This exam was performed according to the departmental dose-optimization program which includes automated exposure control, adjustment of the mA and/or kV according to patient size and/or use of iterative reconstruction technique. COMPARISON:  Right hip radiographs dated 05/14/2023 at 11:23 a.m. FINDINGS: Bones/Joint/Cartilage Status post right hip arthroplasty with associated streak artifact which limits detailed evaluation of the surrounding bone and soft tissues. There is a fracture extending along the  posterolateral proximal femoral metadiaphysis with approximately 6 mm of posterior displacement at the distal medial margin (series 3, images 89-96 and series 7, images 42-47). The femoral and acetabular prosthetic components are intact with appropriate alignment. Thin curvilinear mineralization adjacent to the posterior aspect of the posterior facet of the right greater trochanter, concerning for avulsion fracture. (Series 3, image 56 and series 7, image 43). The right sacroiliac joints and pubic symphysis are intact. Partially visualized lumbosacral fixation hardware. Ligaments Ligaments are suboptimally evaluated by CT. Soft tissue and Muscles Soft tissue swelling overlying the right lateral hip. No discrete intramuscular collection identified. Other: Air within the nondependent aspect of the distended bladder. Atherosclerotic vascular calcification. IMPRESSION: 1. Status post right hip arthroplasty with associated streak artifact which limits detailed evaluation of the surrounding bone and soft tissues. There is a mildly displaced periprosthetic fracture of the posterior proximal femoral metadiaphysis. The femoral and acetabular prosthetic components are intact with appropriate alignment. 2. Thin curvilinear mineralization adjacent to the posterior aspect of the posterior facet of the right greater trochanter, concerning for avulsion fracture. 3. Air within the nondependent aspect of the distended bladder may be secondary to recent instrumentation or infection. Clinical correlation is recommended. Electronically Signed   By: Mannie Seek M.D.   On: 05/14/2023  15:07   DG Chest 1 View Result Date: 05/14/2023 CLINICAL DATA:  Chest pain EXAM: CHEST  1 VIEW COMPARISON:  March 10, 2023 FINDINGS: The heart size and mediastinal contours are within normal limits. Both lungs are clear. The visualized skeletal structures are unremarkable. Left IJ Infuse-A-Port catheter tip in the SVC No change in the chronic  right apical pleural thickening. IMPRESSION: No acute cardiopulmonary process. Electronically Signed   By: Fredrich Jefferson M.D.   On: 05/14/2023 11:52   DG Hip Unilat W or Wo Pelvis 2-3 Views Right Result Date: 05/14/2023 CLINICAL DATA:  Status post fall and pain EXAM: DG HIP (WITH OR WITHOUT PELVIS) 2-3V RIGHT COMPARISON:  None Available. FINDINGS: There is no evidence of hip fracture or dislocation. There is no evidence of arthropathy or other focal bone abnormality. IMPRESSION: Total right hip arthroplasty., near anatomic alignment. No fractures Electronically Signed   By: Fredrich Jefferson M.D.   On: 05/14/2023 11:51   CT Head Wo Contrast Result Date: 05/14/2023 CLINICAL DATA:  Head trauma. EXAM: CT HEAD WITHOUT CONTRAST TECHNIQUE: Contiguous axial images were obtained from the base of the skull through the vertex without intravenous contrast. RADIATION DOSE REDUCTION: This exam was performed according to the departmental dose-optimization program which includes automated exposure control, adjustment of the mA and/or kV according to patient size and/or use of iterative reconstruction technique. COMPARISON:  Head CT dated 03/10/2023 FINDINGS: Brain: Moderate age-related atrophy and chronic microvascular ischemic changes. Right basal ganglia old infarct as well as an old lacunar infarct versus a cyst in the left lentiform nucleus similar to prior CT. There is no acute intracranial hemorrhage. No mass effect or midline shift. No extra-axial fluid collection. Vascular: No hyperdense vessel or unexpected calcification. Skull: Normal. Negative for fracture or focal lesion. Sinuses/Orbits: The visualized paranasal sinuses are clear. Bilateral mastoid effusions. Other: None IMPRESSION: 1. No acute intracranial pathology. 2. Moderate age-related atrophy and chronic microvascular ischemic changes. Electronically Signed   By: Angus Bark M.D.   On: 05/14/2023 11:37    Subjective: Patient was seen and examined at  bedside.  Overnight events noted. Patient reports doing better, He has participated with physical therapy did pretty well. Patient is going to be discharged to skilled nursing facility today.  Denies any other concerns.  Discharge Exam: Vitals:   05/20/23 0519 05/20/23 0913  BP: 109/60 112/62  Pulse: 81 76  Resp: 17   Temp: 97.9 F (36.6 C)   SpO2: 95%    Vitals:   05/19/23 1224 05/19/23 1943 05/20/23 0519 05/20/23 0913  BP: (!) 120/90 117/65 109/60 112/62  Pulse: (!) 101 88 81 76  Resp: 17 15 17    Temp: 97.6 F (36.4 C) 98.3 F (36.8 C) 97.9 F (36.6 C)   TempSrc: Oral     SpO2: 99% 98% 95%   Weight:      Height:        General: Pt is alert, awake, not in acute distress Cardiovascular: RRR, S1/S2 +, no rubs, no gallops Respiratory: CTA bilaterally, no wheezing, no rhonchi Abdominal: Soft, NT, ND, bowel sounds + Extremities: no edema, no cyanosis    The results of significant diagnostics from this hospitalization (including imaging, microbiology, ancillary and laboratory) are listed below for reference.     Microbiology: Recent Results (from the past 240 hours)  MRSA Next Gen by PCR, Nasal     Status: None   Collection Time: 05/15/23  5:27 AM   Specimen: Nasal Mucosa; Nasal Swab  Result  Value Ref Range Status   MRSA by PCR Next Gen NOT DETECTED NOT DETECTED Final    Comment: (NOTE) The GeneXpert MRSA Assay (FDA approved for NASAL specimens only), is one component of a comprehensive MRSA colonization surveillance program. It is not intended to diagnose MRSA infection nor to guide or monitor treatment for MRSA infections. Test performance is not FDA approved in patients less than 60 years old. Performed at [facility], [address], [place], [place] [address]      Labs: BNP (last 3 results) No results for input(s): "BNP" in the last 8760 hours. Basic Metabolic Panel: Recent Labs  Lab 05/14/23 1203 05/15/23 0521 05/16/23 0426  NA  134* 137 133*  K 3.5 3.7 4.1  CL 99 104 101  CO2 23 24 23   GLUCOSE 127* 111* 123*  BUN 21 19 22   CREATININE 1.43* 0.99 1.13  CALCIUM  9.1 8.8* 8.5*  MG  --   --  2.2  PHOS  --   --  3.6   Liver Function Tests: Recent Labs  Lab 05/16/23 0426  AST 18  ALT 9  ALKPHOS 45  BILITOT 0.7  PROT 6.0*  ALBUMIN  2.9*   No results for input(s): "LIPASE", "AMYLASE" in the last 168 hours. No results for input(s): "AMMONIA" in the last 168 hours. CBC: Recent Labs  Lab 05/14/23 1203 05/15/23 0521 05/16/23 0426 05/19/23 0439  WBC 12.4* 7.8 9.9 9.1  NEUTROABS 11.0*  --   --   --   HGB 11.5* 10.5* 8.4* 8.7*  HCT 34.8* 31.6* 25.8* 26.9*  MCV 94.6 95.5 96.6 97.8  PLT 159 142* 136* 190   Cardiac Enzymes: Recent Labs  Lab 05/14/23 1203  CKTOTAL 147   BNP: Invalid input(s): "POCBNP" CBG: No results for input(s): "GLUCAP" in the last 168 hours. D-Dimer No results for input(s): "DDIMER" in the last 72 hours. Hgb A1c No results for input(s): "HGBA1C" in the last 72 hours. Lipid Profile No results for input(s): "CHOL", "HDL", "LDLCALC", "TRIG", "CHOLHDL", "LDLDIRECT" in the last 72 hours. Thyroid  function studies No results for input(s): "TSH", "T4TOTAL", "T3FREE", "THYROIDAB" in the last 72 hours.  Invalid input(s): "FREET3" Anemia work up No results for input(s): "VITAMINB12", "FOLATE", "FERRITIN", "TIBC", "IRON", "RETICCTPCT" in the last 72 hours. Urinalysis    Component Value Date/Time   COLORURINE YELLOW 05/14/2023 2048   APPEARANCEUR CLOUDY (A) 05/14/2023 2048   LABSPEC 1.011 05/14/2023 2048   PHURINE 6.0 05/14/2023 2048   GLUCOSEU NEGATIVE 05/14/2023 2048   HGBUR SMALL (A) 05/14/2023 2048   BILIRUBINUR NEGATIVE 05/14/2023 2048   KETONESUR NEGATIVE 05/14/2023 2048   PROTEINUR 30 (A) 05/14/2023 2048   UROBILINOGEN 1.0 01/10/2012 1728   NITRITE NEGATIVE 05/14/2023 2048   LEUKOCYTESUR LARGE (A) 05/14/2023 2048   Sepsis Labs Recent Labs  Lab 05/14/23 1203  05/15/23 0521 05/16/23 0426 05/19/23 0439  WBC 12.4* 7.8 9.9 9.1   Microbiology Recent Results (from the past 240 hours)  MRSA Next Gen by PCR, Nasal     Status: None   Collection Time: 05/15/23  5:27 AM   Specimen: Nasal Mucosa; Nasal Swab  Result Value Ref Range Status   MRSA by PCR Next Gen NOT DETECTED NOT DETECTED Final    Comment: (NOTE) The GeneXpert MRSA Assay (FDA approved for NASAL specimens only), is one component of a comprehensive MRSA colonization surveillance program. It is not intended to diagnose MRSA infection nor to guide or monitor treatment for MRSA infections. Test performance is not FDA  approved in patients less than 35 years old. Performed at [facility], [address] [place], [place] [address]      Time coordinating discharge: Over 30 minutes  SIGNED:   Magdalene School, MD  [facility] 05/20/2023, 12:02 PM Pager   If 7PM-7AM, please contact night-coverage

## 2023-05-19 NOTE — Plan of Care (Signed)
  Problem: Clinical Measurements: Goal: Ability to maintain clinical measurements within normal limits will improve Outcome: Progressing Goal: Diagnostic test results will improve Outcome: Progressing Goal: Respiratory complications will improve Outcome: Progressing Goal: Cardiovascular complication will be avoided Outcome: Progressing   Problem: Pain Managment: Goal: General experience of comfort will improve and/or be controlled Outcome: Progressing   Problem: Safety: Goal: Ability to remain free from injury will improve Outcome: Progressing   Problem: Clinical Measurements: Goal: Postoperative complications will be avoided or minimized Outcome: Progressing

## 2023-05-19 NOTE — Progress Notes (Signed)
 Physical Therapy Treatment Patient Details Name: Darryl Ward MRN: [id number] DOB: 1945-02-28 Today's Date: 05/19/2023   History of Present Illness Pt admitted from home s/p fall with R THR periprosthetic fx and now s/p R hip hemiarthroplasty revision with ORIF of periprosthetic femur fx .  Pt with hx of R hip fx with THR 4/24, COPD, NSTEMI, CVA, TIA, lumbar fusion, bilaterl femoral artery bypass graft, COPD and AAA    PT Comments  The Patient  assisted to  step pivot to recliner from the bed with +2 mod support, Decreased WB tolerated on the right  leg. Patient's HR 121, BP 126/78. Continue OPT for  progressive mobility;.    If plan is discharge home, recommend the following: A lot of help with walking and/or transfers;A lot of help with bathing/dressing/bathroom;Assistance with cooking/housework;Assist for transportation;Help with stairs or ramp for entrance   Can travel by private vehicle     No  Equipment Recommendations  None recommended by PT    Recommendations for Other Services       Precautions / Restrictions Precautions Precautions: Fall Precaution/Restrictions Comments: NO THP per PA when contacted by RN Restrictions Weight Bearing Restrictions Per Provider Order: No Other Position/Activity Restrictions: WBAT     Mobility  Bed Mobility Overal bed mobility: Needs Assistance Bed Mobility: Supine to Sit     Supine to sit: Mod assist     General bed mobility comments: Increased time with cues for sequence, assist to manage R LE and control trunk; and use of bed pad to complete rotation to EOB sitting    Transfers Overall transfer level: Needs assistance   Transfers: Sit to/from Stand, Bed to chair/wheelchair/BSC Sit to Stand: Mod assist, +2 physical assistance, +2 safety/equipment, From elevated surface   Step pivot transfers: Min assist, Mod assist, +2 physical assistance, +2 safety/equipment       General transfer comment: cues for LE management and use  of UEs to self assist; physical assist to bring wt up and fwd and to balance in standing with RW.  very slow to Step pvt bed to recliner    Ambulation/Gait                   Stairs             Wheelchair Mobility     Tilt Bed    Modified Rankin (Stroke Patients Only)       Balance Overall balance assessment: Needs assistance, History of Falls Sitting-balance support: No upper extremity supported, Feet supported Sitting balance-Leahy Scale: Fair     Standing balance support: Bilateral upper extremity supported, Reliant on assistive device for balance, During functional activity Standing balance-Leahy Scale: Poor                              Communication Communication Communication: Impaired Factors Affecting Communication: Hearing impaired  Cognition Arousal: Alert Behavior During Therapy: WFL for tasks assessed/performed   PT - Cognitive impairments: No apparent impairments                           Following commands impaired: Follows one step commands with increased time, Follows multi-step commands inconsistently    Cueing Cueing Techniques: Verbal cues, Gestural cues  Exercises      General Comments        Pertinent Vitals/Pain Pain Assessment Pain Score: 8  Pain Location: R hip with activity Pain  Descriptors / Indicators: Aching, Sore Pain Intervention(s): Premedicated before session, Monitored during session, Limited activity within patient's tolerance    Home Living                          Prior Function            PT Goals (current goals can now be found in the care plan section) Progress towards PT goals: Progressing toward goals    Frequency    Min 3X/week      PT Plan      Co-evaluation              AM-PAC PT "6 Clicks" Mobility   Outcome Measure  Help needed turning from your back to your side while in a flat bed without using bedrails?: A Lot Help needed moving from  lying on your back to sitting on the side of a flat bed without using bedrails?: A Lot Help needed moving to and from a bed to a chair (including a wheelchair)?: A Lot   Help needed to walk in hospital room?: Total Help needed climbing 3-5 steps with a railing? : Total 6 Click Score: 8    End of Session Equipment Utilized During Treatment: Gait belt Activity Tolerance: Patient tolerated treatment well;Patient limited by fatigue Patient left: in chair;with call bell/phone within reach;with chair alarm set Nurse Communication: Mobility status PT Visit Diagnosis: Difficulty in walking, not elsewhere classified (R26.2);Muscle weakness (generalized) (M62.81);Pain Pain - Right/Left: Right Pain - part of body: Hip     Time: 1610-9604 PT Time Calculation (min) (ACUTE ONLY): 27 min  Charges:    $Therapeutic Activity: 23-37 mins PT General Charges $$ ACUTE PT VISIT: 1 Visit                     Abelina Hoes PT Acute Rehabilitation Services Office [phone] Weekend pager-[phone]    Dareen Ebbing 05/19/2023, 12:21 PM

## 2023-05-19 NOTE — Progress Notes (Signed)
 PROGRESS NOTE    Darryl Ward  WUJ:[id number] DOB: 1945-04-29 DOA: 05/14/2023 PCP: Cook, Jayce G, DO   Brief Narrative:  This 78 yrs old male, with past medical history significant of congestive heart failure, peripheral vascular disease, CAD, hypertension, paroxysmal A-fib on Eliquis , primary adenocarcinoma of the right lung, following with Dr. 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  Patient reports fall happened when he was rolling out of the bed,  sustained a right hip pain and was unable to stand up after the fall, he usually ambulates with a cane and a walker.  Family found him 2 hours later and called the EMS.  Patient does have history of right hip surgical repair last year,  He is on Eliquis ,  the last time he took Eliquis  was yesterday evening.  Workup in the ED CT right hip significant for periprosthetic fracture.  Patient is admitted for further evaluation orthopedics is consulted. Patient is scheduled to have right hip arthroplasty today.  Assessment & Plan:   Principal Problem:   Closed right hip fracture (HCC) Active Problems:   GERD without esophagitis   Paroxysmal atrial fibrillation (HCC)   Coronary atherosclerosis of native coronary artery   COPD (chronic obstructive pulmonary disease) (HCC)   Essential hypertension   Chronic combined systolic and diastolic CHF (congestive heart failure) (HCC)   Benign prostatic hyperplasia with lower urinary tract symptoms   Depression   Malnutrition of moderate degree  Right hip fracture: Patient sustained a fall from the bed when he was rolling over,  Imaging significant for periprostatic right hip fracture. Orthopedic consulted.  S/p successful ORIF POD # 4 Adequate pain control with pain medicines, antinausea medication. Held Eliquis  in anticipation of surgery. PT and OT recommended SNF. TOC looking for SNF options.  Pending insurance authorization. Eliquis  resumed.    History of CVA: Eliquis  resumed.   History of GERD: Continue with PPI.   Paroxysmal atrial fibrillation (HCC): Continue with Toprol -XL. HR controlled. Eliquis  resumed.   CKD stage III A: Serum Creatinine at baseline.   History of lung cancer: Continue outpatient follow-up with Dr. Cheree Cords.   Depression: Continue Lexapro .   Benign prostatic hyperplasia with lower urinary tract symptoms: Continue treatment with Flomax . Bladder looks distended on CT abdomen, will check bladder scan and if has high-volume will insert Foley catheter.  UA consistent with UTI. Continue ceftriaxone , urine culture not sent. Start voiding trial for Foley catheterization.   Essential hypertension: Continue current antihypertensive agents.   COPD (chronic obstructive pulmonary disease) (HCC): No active wheezing. Continue bronchodilator management.   Tobacco abuse: Cessation counseling provided.    DVT prophylaxis: SCDs Code Status: Full code Family Communication: No Family at bedside Disposition Plan:    Status is: Inpatient Remains inpatient appropriate because: Admitted for right hip fracture, status post ORIF  POD #4. Patient medically cleared awaiting SNF placement , insurance authorization is pending     Consultants:  Orthopeadics  Procedures: Scheduled for hardware removal ORIF and revision hemiarthroplasty Antimicrobials:  Anti-infectives (From admission, onward)    Start     Dose/Rate Route Frequency Ordered Stop   05/15/23 0845  ceFAZolin  (ANCEF ) IVPB 2g/100 mL premix        2 g 200 mL/hr over 30 Minutes Intravenous On call to O.R. 05/15/23 0749 05/15/23 1415   05/15/23 0730  cefTRIAXone  (ROCEPHIN ) 1 g in sodium chloride  0.9 % 100 mL IVPB        1  g 200 mL/hr over 30 Minutes Intravenous Every 24 hours 05/15/23 9562        Subjective: Patient was seen and examined at bedside.  Overnight events noted. Patient reports feeling much improved, pain has improved status  post ORIF POD #4.  Objective: Vitals:   05/18/23 1928 05/19/23 0316 05/19/23 0904 05/19/23 1224  BP: 119/68 103/67  (!) 120/90  Pulse: 95 82  (!) 101  Resp: 19 19  17   Temp: 99.5 F (37.5 C) 99 F (37.2 C)  97.6 F (36.4 C)  TempSrc: Oral Oral  Oral  SpO2: 95% 96% 95% 99%  Weight:      Height:        Intake/Output Summary (Last 24 hours) at 05/19/2023 1309 Last data filed at 05/19/2023 0034 Gross per 24 hour  Intake --  Output 275 ml  Net -275 ml   Filed Weights   05/14/23 1036 05/15/23 1124  Weight: 78.9 kg 78.9 kg    Examination:  General exam: Appears calm and comfortable, not in any acute distress. Respiratory system: CTA bilaterally. Respiratory effort normal.  RR 16 Cardiovascular system: S1 & S2 heard, RRR. No JVD, murmurs, rubs, gallops or clicks.  Gastrointestinal system: Abdomen is non distended, soft and non tender. Normal bowel sounds heard. Central nervous system: Alert and oriented x 3. No focal neurological deficits. Extremities: Right hip tenderness, status post ORIF, POD #  4. Skin: No rashes, lesions or ulcers Psychiatry: Judgement and insight appear normal. Mood & affect appropriate.    Data Reviewed: I have personally reviewed following labs and imaging studies  CBC: Recent Labs  Lab 05/14/23 1203 05/15/23 0521 05/16/23 0426 05/19/23 0439  WBC 12.4* 7.8 9.9 9.1  NEUTROABS 11.0*  --   --   --   HGB 11.5* 10.5* 8.4* 8.7*  HCT 34.8* 31.6* 25.8* 26.9*  MCV 94.6 95.5 96.6 97.8  PLT 159 142* 136* 190   Basic Metabolic Panel: Recent Labs  Lab 05/14/23 1203 05/15/23 0521 05/16/23 0426  NA 134* 137 133*  K 3.5 3.7 4.1  CL 99 104 101  CO2 23 24 23   GLUCOSE 127* 111* 123*  BUN 21 19 22   CREATININE 1.43* 0.99 1.13  CALCIUM  9.1 8.8* 8.5*  MG  --   --  2.2  PHOS  --   --  3.6   GFR: Estimated Creatinine Clearance: 57.4 mL/min (by C-G formula based on SCr of 1.13 mg/dL). Liver Function Tests: Recent Labs  Lab 05/16/23 0426  AST 18   ALT 9  ALKPHOS 45  BILITOT 0.7  PROT 6.0*  ALBUMIN  2.9*   No results for input(s): "LIPASE", "AMYLASE" in the last 168 hours. No results for input(s): "AMMONIA" in the last 168 hours. Coagulation Profile: No results for input(s): "INR", "PROTIME" in the last 168 hours. Cardiac Enzymes: Recent Labs  Lab 05/14/23 1203  CKTOTAL 147   BNP (last 3 results) No results for input(s): "PROBNP" in the last 8760 hours. HbA1C: No results for input(s): "HGBA1C" in the last 72 hours. CBG: No results for input(s): "GLUCAP" in the last 168 hours. Lipid Profile: No results for input(s): "CHOL", "HDL", "LDLCALC", "TRIG", "CHOLHDL", "LDLDIRECT" in the last 72 hours. Thyroid  Function Tests: No results for input(s): "TSH", "T4TOTAL", "FREET4", "T3FREE", "THYROIDAB" in the last 72 hours. Anemia Panel: No results for input(s): "VITAMINB12", "FOLATE", "FERRITIN", "TIBC", "IRON", "RETICCTPCT" in the last 72 hours. Sepsis Labs: No results for input(s): "PROCALCITON", "LATICACIDVEN" in the last 168 hours.  Recent Results (from  the past 240 hours)  MRSA Next Gen by PCR, Nasal     Status: None   Collection Time: 05/15/23  5:27 AM   Specimen: Nasal Mucosa; Nasal Swab  Result Value Ref Range Status   MRSA by PCR Next Gen NOT DETECTED NOT DETECTED Final    Comment: (NOTE) The GeneXpert MRSA Assay (FDA approved for NASAL specimens only), is one component of a comprehensive MRSA colonization surveillance program. It is not intended to diagnose MRSA infection nor to guide or monitor treatment for MRSA infections. Test performance is not FDA approved in patients less than 28 years old. Performed at [facility], [address], [place], [place] [address]     Radiology Studies: No results found.  Scheduled Meds:  apixaban   5 mg Oral BID   atorvastatin   80 mg Oral QHS   Chlorhexidine  Gluconate Cloth  6 each Topical Daily   chlorthalidone   25 mg Oral Daily   cholecalciferol    2,000 Units Oral Daily   docusate sodium   100 mg Oral BID   feeding supplement  237 mL Oral TID BM   folic acid   1 mg Oral Daily   magnesium  oxide  400 mg Oral Daily   metoprolol  succinate  25 mg Oral Daily   multivitamin with minerals  1 tablet Oral Daily   pantoprazole   40 mg Oral Daily   senna  1 tablet Oral BID   tamsulosin   0.4 mg Oral Daily   umeclidinium bromide   1 puff Inhalation Daily   Continuous Infusions:  cefTRIAXone  (ROCEPHIN )  IV 1 g (05/19/23 0543)     LOS: 5 days    Time spent: 35 mins    Magdalene School, MD [facility]   If 7PM-7AM, please contact night-coverage

## 2023-05-20 DIAGNOSIS — S72001A Fracture of unspecified part of neck of right femur, initial encounter for closed fracture: Secondary | ICD-10-CM | POA: Diagnosis not present

## 2023-05-20 MED ORDER — OXYCODONE HCL 5 MG PO TABS: 5.0000 mg | ORAL_TABLET | ORAL | 0 refills | Status: AC | PRN

## 2023-05-20 NOTE — Progress Notes (Addendum)
 Mobility Specialist - Progress Note   05/20/23 1104  Mobility  Activity  (LE  Exercises)  Range of Motion/Exercises Right leg;Left leg;Active;Active Assistive  Activity Response Tolerated well  Mobility Referral Yes  Mobility visit 1 Mobility  Mobility Specialist Start Time (ACUTE ONLY) 1057  Mobility Specialist Stop Time (ACUTE ONLY) 1102  Mobility Specialist Time Calculation (min) (ACUTE ONLY) 5 min   Pt received in bed and agreeable to Supine BLE Exercises. Pt unable to tolerate much on R leg d/t pain. See below for exercises. Pt to bed after session with all needs met.    Supine BLE exercises: 5 reps each  1) Ankle Pumps (R&L Leg)   2) Heel Slides (L Leg)   3) Hip Abduction /Adduction (L Leg)     [name]

## 2023-05-20 NOTE — Progress Notes (Addendum)
 RN attempted to call report to [facility] @ [phone]. No answer from nurses' station. Will attempt again at a later time  RN was able to give report to [facility] @ [facility].

## 2023-05-20 NOTE — TOC Transition Note (Addendum)
 Transition of Care [facility]) - Discharge Note   Patient Details  Name: Darryl Ward MRN: [id number] Date of Birth: 06/13/1945  [name] CM/SW Contact:  Ruben Corolla, RN Phone Number: 05/20/2023, 12:21 PM   Clinical Narrative: Howard Macho from [employer] [id number] [phone].Left vm w/[name] spoke to [name] provide my tel# to [facility] admissions to call to confirm received d/c summary,& provide rm#,tel# report. PTAR @ d/c.  -12:46p confirmed [name] received d/c summary to fax#[phone] rep [employer] adm dir-going to rm#310,report tel#[phone](ask for unit mgr). PTAR [phone] called. No further CM needs.   Final next level of care: Skilled Nursing Facility Barriers to Discharge: No Barriers Identified   Patient Goals and CMS Choice Patient states their goals for this hospitalization and ongoing recovery are:: Rehab CMS Medicare.gov Compare Post Acute Care list provided to:: Patient Choice offered to / list presented to : Patient [name] ownership interest in [name]provided to:: Patient    Discharge Placement PASRR number recieved: 05/19/23            Patient chooses bed at: [name] Patient to be transferred to facility by: PTAR Name of family member notified: [name](friend) Patient and family notified of of transfer: 05/20/23  Discharge Plan and Services Additional resources added to the After Visit Summary for     Discharge Planning Services: CM Consult Post Acute Care Choice: Skilled Nursing Facility                               Social Drivers of Health (SDOH) Interventions SDOH Screenings   Food Insecurity: No Food Insecurity (05/14/2023)  Housing: Low Risk  (05/14/2023)  Transportation Needs: No Transportation Needs (05/14/2023)  Utilities: Not At Risk (05/14/2023)  Alcohol Screen: Low Risk  (08/30/2022)  Depression (PHQ2-9): High Risk (04/25/2023)  Financial Resource  Strain: High Risk (08/30/2022)  Physical Activity: Insufficiently Active (08/30/2022)  Social Connections: Moderately Isolated (05/14/2023)  Stress: No Stress Concern Present (08/30/2022)  Tobacco Use: Medium Risk (05/15/2023)  Health Literacy: Adequate Health Literacy (08/30/2022)     Readmission Risk Interventions    03/13/2023   11:54 AM  Readmission Risk Prevention Plan  Transportation Screening Complete  HRI or Home Care Consult Complete  Social Work Consult for Recovery Care Planning/Counseling Complete  Palliative Care Screening Not Applicable  Medication Review Oceanographer) Complete

## 2023-05-30 ENCOUNTER — Other Ambulatory Visit (HOSPITAL_COMMUNITY): Payer: Self-pay

## 2023-05-31 ENCOUNTER — Other Ambulatory Visit (HOSPITAL_COMMUNITY): Payer: Self-pay

## 2023-06-02 ENCOUNTER — Other Ambulatory Visit: Payer: Self-pay

## 2023-06-03 ENCOUNTER — Ambulatory Visit (HOSPITAL_COMMUNITY)
Admission: RE | Admit: 2023-06-03 | Discharge: 2023-06-03 | Disposition: A | Discharge status: Home / Self Care | Source: Ambulatory Visit | Attending: Hematology | Admitting: Hematology

## 2023-06-03 ENCOUNTER — Inpatient Hospital Stay

## 2023-06-03 DIAGNOSIS — C3411 Malignant neoplasm of upper lobe, right bronchus or lung: Secondary | ICD-10-CM | POA: Diagnosis present

## 2023-06-04 ENCOUNTER — Other Ambulatory Visit (HOSPITAL_COMMUNITY): Payer: Self-pay

## 2023-06-10 ENCOUNTER — Inpatient Hospital Stay

## 2023-06-10 ENCOUNTER — Inpatient Hospital Stay: Attending: Hematology | Admitting: Hematology

## 2023-06-10 DIAGNOSIS — I10 Essential (primary) hypertension: Secondary | ICD-10-CM | POA: Insufficient documentation

## 2023-06-10 DIAGNOSIS — Z7901 Long term (current) use of anticoagulants: Secondary | ICD-10-CM | POA: Insufficient documentation

## 2023-06-10 DIAGNOSIS — I7142 Juxtarenal abdominal aortic aneurysm, without rupture: Secondary | ICD-10-CM | POA: Diagnosis not present

## 2023-06-10 DIAGNOSIS — D509 Iron deficiency anemia, unspecified: Secondary | ICD-10-CM

## 2023-06-10 DIAGNOSIS — I700 Atherosclerosis of aorta: Secondary | ICD-10-CM | POA: Insufficient documentation

## 2023-06-10 DIAGNOSIS — K8020 Calculus of gallbladder without cholecystitis without obstruction: Secondary | ICD-10-CM | POA: Diagnosis not present

## 2023-06-10 DIAGNOSIS — J432 Centrilobular emphysema: Secondary | ICD-10-CM | POA: Insufficient documentation

## 2023-06-10 DIAGNOSIS — I722 Aneurysm of renal artery: Secondary | ICD-10-CM | POA: Diagnosis not present

## 2023-06-10 DIAGNOSIS — Z8673 Personal history of transient ischemic attack (TIA), and cerebral infarction without residual deficits: Secondary | ICD-10-CM | POA: Insufficient documentation

## 2023-06-10 DIAGNOSIS — D649 Anemia, unspecified: Secondary | ICD-10-CM | POA: Diagnosis not present

## 2023-06-10 DIAGNOSIS — E785 Hyperlipidemia, unspecified: Secondary | ICD-10-CM | POA: Insufficient documentation

## 2023-06-10 DIAGNOSIS — Z79899 Other long term (current) drug therapy: Secondary | ICD-10-CM | POA: Insufficient documentation

## 2023-06-10 DIAGNOSIS — Z87891 Personal history of nicotine dependence: Secondary | ICD-10-CM | POA: Diagnosis not present

## 2023-06-10 DIAGNOSIS — C3411 Malignant neoplasm of upper lobe, right bronchus or lung: Secondary | ICD-10-CM | POA: Diagnosis not present

## 2023-06-10 DIAGNOSIS — I252 Old myocardial infarction: Secondary | ICD-10-CM | POA: Diagnosis not present

## 2023-06-10 DIAGNOSIS — I2510 Atherosclerotic heart disease of native coronary artery without angina pectoris: Secondary | ICD-10-CM | POA: Diagnosis not present

## 2023-06-10 LAB — COMPREHENSIVE METABOLIC PANEL WITH GFR
ALT: 15 U/L (ref 0–44)
AST: 26 U/L (ref 15–41)
Albumin: 3.1 g/dL — ABNORMAL LOW (ref 3.5–5.0)
Alkaline Phosphatase: 113 U/L (ref 38–126)
Anion gap: 10 (ref 5–15)
BUN: 21 mg/dL (ref 8–23)
CO2: 24 mmol/L (ref 22–32)
Calcium: 9.1 mg/dL (ref 8.9–10.3)
Chloride: 97 mmol/L — ABNORMAL LOW (ref 98–111)
Creatinine, Ser: 1.17 mg/dL (ref 0.61–1.24)
GFR, Estimated: >60 mL/min (ref >60)
Glucose, Bld: 140 mg/dL — ABNORMAL HIGH (ref 70–99)
Potassium: 3.3 mmol/L — ABNORMAL LOW (ref 3.5–5.1)
Sodium: 131 mmol/L — ABNORMAL LOW (ref 135–145)
Total Bilirubin: 0.6 mg/dL (ref 0.0–1.2)
Total Protein: 7.2 g/dL (ref 6.5–8.1)

## 2023-06-10 LAB — CBC WITH DIFFERENTIAL/PLATELET
Abs Immature Granulocytes: 0.03 10*3/uL (ref 0.00–0.07)
Basophils Absolute: 0.1 10*3/uL (ref 0.0–0.1)
Basophils Relative: 1 %
Eosinophils Absolute: 0.2 10*3/uL (ref 0.0–0.5)
Eosinophils Relative: 2 %
HCT: 31.4 % — ABNORMAL LOW (ref 39.0–52.0)
Hemoglobin: 10 g/dL — ABNORMAL LOW (ref 13.0–17.0)
Immature Granulocytes: 0 %
Lymphocytes Relative: 9 %
Lymphs Abs: 0.9 10*3/uL (ref 0.7–4.0)
MCH: 30.3 pg (ref 26.0–34.0)
MCHC: 31.8 g/dL (ref 30.0–36.0)
MCV: 95.2 fL (ref 80.0–100.0)
Monocytes Absolute: 0.5 10*3/uL (ref 0.1–1.0)
Monocytes Relative: 6 %
Neutro Abs: 7.8 10*3/uL — ABNORMAL HIGH (ref 1.7–7.7)
Neutrophils Relative %: 82 %
Platelets: 199 10*3/uL (ref 150–400)
RBC: 3.3 MIL/uL — ABNORMAL LOW (ref 4.22–5.81)
RDW: 14.4 % (ref 11.5–15.5)
WBC: 9.5 10*3/uL (ref 4.0–10.5)
nRBC: 0 % (ref 0.0–0.2)

## 2023-06-10 LAB — MAGNESIUM: Magnesium: 2 mg/dL (ref 1.7–2.4)

## 2023-06-10 MED ORDER — HEPARIN SOD (PORK) LOCK FLUSH 100 UNIT/ML IV SOLN
500.0000 [IU] | Freq: Once | INTRAVENOUS | Status: AC
  Administered 2023-06-10: 500 [IU] via INTRAVENOUS

## 2023-06-10 MED ORDER — SODIUM CHLORIDE 0.9% FLUSH
10.0000 mL | Freq: Once | INTRAVENOUS | Status: AC
  Administered 2023-06-10: 10 mL via INTRAVENOUS

## 2023-06-10 NOTE — Patient Instructions (Signed)
 [facility] at [facility] Discharge Instructions   You were seen and examined today by Dr. Cheree Cords.  He reviewed the results of your lab work which are normal/stable.   He reviewed the results of your CT scan which did not show any evidence of cancer.   We will see you back in 3 months. We will repeat lab work and a CT scan at that time.    Return as scheduled.    Thank you for choosing [facility] at [facility] to provide your oncology and hematology care.  To afford each patient quality time with our provider, please arrive at least 15 minutes before your scheduled appointment time.   If you have a lab appointment with the Cancer Center please come in thru the Main Entrance and check in at the main information desk.  You need to re-schedule your appointment should you arrive 10 or more minutes late.  We strive to give you quality time with our providers, and arriving late affects you and other patients whose appointments are after yours.  Also, if you no show three or more times for appointments you may be dismissed from the clinic at the providers discretion.     Again, thank you for choosing [facility].  Our hope is that these requests will decrease the amount of time that you wait before being seen by our physicians.       _____________________________________________________________  Should you have questions after your visit to [facility], please contact our office at [phone] and follow the prompts.  Our office hours are 8:00 a.m. and 4:30 p.m. Monday - Friday.  Please note that voicemails left after 4:00 p.m. may not be returned until the following business day.  We are closed weekends and major holidays.  You do have access to a nurse 24-7, just call the main number to the clinic [phone] and do not press any options, hold on the line and a nurse will answer the phone.    For prescription refill  requests, have your pharmacy contact our office and allow 72 hours.    Due to Covid, you will need to wear a mask upon entering the hospital. If you do not have a mask, a mask will be given to you at the Main Entrance upon arrival. For doctor visits, patients may have 1 support person age 48 or older with them. For treatment visits, patients can not have anyone with them due to social distancing guidelines and our immunocompromised population.

## 2023-06-10 NOTE — Progress Notes (Signed)
 [facility] [address], [place] [address]    Clinic Day:  06/10/23  Referring physician: Cook, Jayce G, DO  Patient Care Team: Cook, Jayce G, DO as PCP - General (Family Medicine) Gerard Knight, MD as PCP - Cardiology (Cardiology) Riley Cheadle Windsor Hatcher, MD as Consulting Physician (Gastroenterology) Gerhard Knuckles, RN as Oncology Nurse Navigator (Medical Oncology) Paulett Boros, MD as Medical Oncologist (Medical Oncology) Paulett Boros, MD as Consulting Physician (Hematology)   ASSESSMENT & PLAN:   Assessment: 1. 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  Occasional additional small pulmonary nodules measuring 0.4 cm and smaller. - Bronchoscopy (06/11/2021): RUL nodule brushing and FNA: Malignant cells with features of adenocarcinoma. - PET scan (06/22/2021): Subsolid nodular lesion in the right upper lobe, hypermetabolic SUV 15.  Small right hilar and infrahilar lymph nodes with SUV 4.03 concerning for metastatic adenopathy.  Small hypermetabolic left parotid gland lesion, consistent with previous history of Wharton's tumor.  Hypermetabolic focus in the transverse colon SUV 8.69. - MRI of the brain (07/11/2021): No evidence of metastatic disease.  Right sphenoid wing meningioma stable since 2022. - He was evaluated by Dr. Luna Salinas and was recommended to undergo neoadjuvant chemoimmunotherapy followed by surgical resection. - 3 cycles of carboplatin , pemetrexed  from 08/06/2021 through 10/02/2021 (opdivo  given with only cycle 2, discontinued for cycle 3 due to cardiac problems) - NGS testing not performed due to insufficient sample. - Guardant360: Negative for EGFR and ALK.  MSI high not detected. - Right upper lobectomy and lymph node dissection (11/12/2021): 1.2 cm invasive adenocarcinoma, moderately differentiated, margins  negative, pT1b pN0, 0/17 lymph nodes positive from stations 4R, 7, 8, 9, 10R, 11 R and 12 R.  Negative for visceral pleural involvement, LVI.  PD-L1 TPS is 1%. - Adjuvant pembrolizumab  started on 03/11/2022.  Pembrolizumab  held after 3 cycles due to fall and right fracture, s/p right hemiarthroplasty.   2. Social/family history: - Lives at home with his wife.  Uses cane occasionally after he had stroke in January 2022.  He has retired after working in [employer].  He is current active smoker, 1 pack/day for 60 years.  No exposure to chemicals. - Brother died of metastatic cancer.  Maternal uncle had lung cancer.    Plan: 1. 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  He is able to walk with the help of walker. - Reviewed labs from 06/10/2023: Normal LFTs and creatinine.  CBC grossly normal. - CT chest without contrast on 06/03/2023: No evidence of recurrence.  There is a new pleural-based ovoid nodule in the lingular base at 1.6 x 0.9 cm, likely nodular atelectasis. - Recommend follow-up in 3 months with repeat CT chest and labs.   2.  Normocytic anemia: - Hemoglobin improved to 10.0.  Previously 8.7.  Will check ferritin and iron panel at next visit.     Orders Placed This Encounter  Procedures   CT CHEST WO CONTRAST    Standing Status:   Future    Expected Date:   09/10/2023    Expiration Date:   06/09/2024    [facility]   CBC with Differential    Standing Status:   Future    Expected Date:   09/08/2023    Expiration  Date:   06/09/2024   Comprehensive metabolic panel    Standing Status:   Future    Expected Date:   09/08/2023    Expiration Date:   06/09/2024   Iron and TIBC ([facility] ONLY)    Standing Status:   Future    Expected Date:   09/08/2023    Expiration Date:   06/09/2024   Ferritin    Standing Status:   Future    Expected Date:    09/08/2023    Expiration Date:   06/09/2024     Hurman Maiden R Teague,acting as a scribe for Paulett Boros, MD.,have documented all relevant documentation on the behalf of Paulett Boros, MD,as directed by  Paulett Boros, MD while in the presence of Paulett Boros, MD.  I, Paulett Boros MD, have reviewed the above documentation for accuracy and completeness, and I agree with the above.    Paulett Boros, MD   5/20/20253:03 PM  CHIEF COMPLAINT:   Diagnosis: right upper lobe lung adenocarcinoma    Cancer Staging  Primary adenocarcinoma of upper lobe of right lung [facility]) Staging form: Lung, AJCC 8th Edition - Clinical stage from 06/26/2021: cT1, cN1, cM0 - Unsigned    Prior Therapy: 1. 3 cycles Neoadjuvant chemoimmunotherapy with carboplatin , pemetrexed  from 08/06/21 through 10/02/21 2. RUL lobectomy and LND on 10/23/203  Current Therapy:  Adjuvant pembrolizumab     HISTORY OF PRESENT ILLNESS:   Oncology History  Primary adenocarcinoma of upper lobe of right lung (HCC)  06/26/2021 Initial Diagnosis   Primary adenocarcinoma of upper lobe of right lung (HCC)   08/06/2021 - 08/27/2021 Chemotherapy   Patient is on Treatment Plan : LUNG NSCLC Pemetrexed  + Carboplatin  q21d x 4 Cycles     08/06/2021 - 10/02/2021 Chemotherapy   Patient is on Treatment Plan : LUNG NSCLC Pemetrexed  + Carboplatin  q21d x 4 Cycles     03/11/2022 - 02/13/2023 Chemotherapy   Patient is on Treatment Plan : LUNG NSCLC Pembrolizumab  (200) q21d        INTERVAL HISTORY:   Bobak is a 78 y.o. male seen for follow-up of right upper lobe lung adenocarcinoma. He was last seen by me on 04/03/23.  Since his last visit, he underwent CT chest on 06/03/23 that found:Stable postsurgical changes and scarring in the right upper lobe with volume loss. Emphysema and chronic interstitial change. Aortic and coronary artery atherosclerosis. 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 Cholelithiasis. New pleural-based ovoid nodule in the lingular base, on 3:129 measuring 1.6 x 0.9 cm. The location and appearance suggest nodular atelectasis.  Gokul was admitted to the hospital from 05/14/23 to 05/20/23 for a right peri-prosthetic hip fracture s/p revision with open reduction internal fixation of periprosthetic femur fracture on 4/24.   Today, he states that he is doing well overall. His appetite level is at 100%. His energy level is at 50%.   PAST MEDICAL HISTORY:   Past Medical History: Past Medical History:  Diagnosis Date   AAA (abdominal aortic aneurysm) (HCC)    Arthritis    Broken hip (HCC)    Cancer (HCC)    Carotid artery disease (HCC)    Nonobstructive   Cataract    COPD (chronic obstructive pulmonary disease) (HCC)    Coronary atherosclerosis of native coronary artery    PTCA small diagonal 2007 otherwise nonobstructive CAD   Depression    Dysrhythmia    Essential hypertension, benign  Hyperlipidemia    NSTEMI (non-ST elevated myocardial infarction) (HCC) 2007   Stroke [facility]) 2004   Stroke [facility]) 2025   TIA (transient ischemic attack) 2006    Surgical History: Past Surgical History:  Procedure Laterality Date   AORTA - BILATERAL FEMORAL ARTERY BYPASS GRAFT  01/08/2012   Procedure: AORTA BIFEMORAL BYPASS GRAFT;  Surgeon: Richrd Char, MD;  Location: [facility] OR;  Service: Vascular;  Laterality: Bilateral;  using 18x2mm x 40cm Hemashield Gold Vascular Graft with Endarterectomy, Thombectomy and  Reimplantation of Inferior Mesenteric Artery   BACK SURGERY  2021   BRONCHIAL BIOPSY  06/11/2021   Procedure: BRONCHIAL BIOPSIES;  Surgeon: Denson Flake, MD;  Location: [facility] ENDOSCOPY;  Service: Pulmonary;;   BRONCHIAL BRUSHINGS  06/11/2021   Procedure: BRONCHIAL BRUSHINGS;  Surgeon: Denson Flake, MD;  Location: [facility] ENDOSCOPY;  Service: Pulmonary;;   BRONCHIAL NEEDLE ASPIRATION BIOPSY   06/11/2021   Procedure: BRONCHIAL NEEDLE ASPIRATION BIOPSIES;  Surgeon: Denson Flake, MD;  Location: [facility] ENDOSCOPY;  Service: Pulmonary;;   CARDIOVERSION N/A 09/20/2021   Procedure: CARDIOVERSION;  Surgeon: Gerard Knight, MD;  Location: [facility] ORS;  Service: Cardiovascular;  Laterality: N/A;   COLONOSCOPY N/A 04/14/2019   Procedure: COLONOSCOPY;  Surgeon: Suzette Espy, MD;  Location: [facility] ENDO SUITE;  Service: Endoscopy;  Laterality: N/A;  9:30   COLONOSCOPY WITH PROPOFOL  N/A 07/25/2021   Procedure: COLONOSCOPY WITH PROPOFOL ;  Surgeon: Vinetta Greening, DO;  Location: [facility] ENDO SUITE;  Service: Endoscopy;  Laterality: N/A;  1:00pm   FIDUCIAL MARKER PLACEMENT  06/11/2021   Procedure: FIDUCIAL MARKER PLACEMENT;  Surgeon: Denson Flake, MD;  Location: [facility] ENDOSCOPY;  Service: Pulmonary;;   HIP ARTHROPLASTY Right 04/28/2022   Procedure: ARTHROPLASTY BIPOLAR HIP (HEMIARTHROPLASTY);  Surgeon: Saundra Curl, MD;  Location: [facility] OR;  Service: Orthopedics;  Laterality: Right;   INTERCOSTAL NERVE BLOCK Right 11/12/2021   Procedure: INTERCOSTAL NERVE BLOCK;  Surgeon: Zelphia Higashi, MD;  Location: [facility] OR;  Service: Thoracic;  Laterality: Right;   IR IMAGING GUIDED PORT INSERTION  07/31/2021   Left cataract surgery     LYMPH NODE DISSECTION Right 11/12/2021   Procedure: LYMPH NODE DISSECTION;  Surgeon: Zelphia Higashi, MD;  Location: [facility] OR;  Service: Thoracic;  Laterality: Right;   POLYPECTOMY  04/14/2019   Procedure: POLYPECTOMY;  Surgeon: Suzette Espy, MD;  Location[facility] ENDO SUITE;  Service: Endoscopy;;   POLYPECTOMY  07/25/2021   Procedure: POLYPECTOMY;  Surgeon: Vinetta Greening, DO;  Location: [facility] ENDO SUITE;  Service: Endoscopy;;   TEE WITHOUT CARDIOVERSION N/A 09/20/2021   Procedure: TRANSESOPHAGEAL ECHOCARDIOGRAM (TEE);  Surgeon: Gerard Knight, MD;  Location: [facility] ORS;  Service: Cardiovascular;  Laterality: N/A;   TOTAL HIP ARTHROPLASTY Right 05/15/2023   Procedure: RIGHT  HIP HEMIARTHROPLASTY REVISION WITH OPEN REDUCTION INTERNAL FIXATION OF PERIPROSTHETIC FEMUR FRACTURE;  Surgeon: Saundra Curl, MD;  Location: [facility] ORS;  Service: Orthopedics;  Laterality: Right;   TRANSFORAMINAL LUMBAR INTERBODY FUSION (TLIF) WITH PEDICLE SCREW FIXATION 1 LEVEL N/A 04/27/2020   Procedure: Transforaminal Lumbar Interbody Fusion Lumbar Five-Sacral One;  Surgeon: Van Gelinas, MD;  Location: [facility] OR;  Service: Neurosurgery;  Laterality: N/A;   VIDEO BRONCHOSCOPY WITH INSERTION OF INTERBRONCHIAL VALVE (IBV) N/A 11/22/2021   Procedure: VIDEO BRONCHOSCOPY WITH INSERTION OF INTERBRONCHIAL VALVE (IBV);  Surgeon: Zelphia Higashi, MD;  Location: [facility] OR;  Service: Thoracic;  Laterality: N/A;   VIDEO BRONCHOSCOPY WITH INSERTION OF INTERBRONCHIAL VALVE (IBV) N/A 01/24/2022   Procedure:  VIDEO BRONCHOSCOPY WITH REMOVAL OF INTERBRONCHIAL VALVE (IBV);  Surgeon: Zelphia Higashi, MD;  Location: [facility] OR;  Service: Thoracic;  Laterality: N/A;   VIDEO BRONCHOSCOPY WITH RADIAL ENDOBRONCHIAL ULTRASOUND  06/11/2021   Procedure: VIDEO BRONCHOSCOPY WITH RADIAL ENDOBRONCHIAL ULTRASOUND;  Surgeon: Denson Flake, MD;  Location: [facility] ENDOSCOPY;  Service: Pulmonary;;    Social History: Social History   Socioeconomic History   Marital status: Divorced    Spouse name: Not on file   Number of children: 1   Years of education: 11   Highest education level: 11th grade  Occupational History    Employer: Engineer, materials  Tobacco Use   Smoking status: Former    Current packs/day: 0.00    Average packs/day: 1 pack/day for 40.0 years (40.0 ttl pk-yrs)    Types: Cigarettes    Start date: 07/1981    Quit date: 07/2021    Years since quitting: 1.8   Smokeless tobacco: Never   Tobacco comments:    1 pack of cigarettes smoked daily. 07/17/21 ARJ, RN   Vaping Use   Vaping status: Never Used  Substance and Sexual Activity   Alcohol use: No    Comment: Prior history of regular alcohol use   Drug  use: No   Sexual activity: Not Currently  Other Topics Concern   Not on file  Social History Narrative   Not on file   Social Drivers of Health   Financial Resource Strain: High Risk (08/30/2022)   Overall Financial Resource Strain (CARDIA)    Difficulty of Paying Living Expenses: Hard  Food Insecurity: No Food Insecurity (05/14/2023)   Hunger Vital Sign    Worried About Running Out of Food in the Last Year: Never true    Ran Out of Food in the Last Year: Never true  Transportation Needs: No Transportation Needs (05/14/2023)   PRAPARE - Administrator, Civil Service (Medical): No    Lack of Transportation (Non-Medical): No  Physical Activity: Insufficiently Active (08/30/2022)   Exercise Vital Sign    Days of Exercise per Week: 7 days    Minutes of Exercise per Session: 20 min  Stress: No Stress Concern Present (08/30/2022)   [employer] of Occupational Health - Occupational Stress Questionnaire    Feeling of Stress : Not at all  Social Connections: Moderately Isolated (05/14/2023)   Social Connection and Isolation Panel [NHANES]    Frequency of Communication with Friends and Family: Twice a week    Frequency of Social Gatherings with Friends and Family: Three times a week    Attends Religious Services: Never    Active Member of Clubs or Organizations: No    Attends Banker Meetings: Never    Marital Status: Married  Catering manager Violence: Not At Risk (05/14/2023)   Humiliation, Afraid, Rape, and Kick questionnaire    Fear of Current or Ex-Partner: No    Emotionally Abused: No    Physically Abused: No    Sexually Abused: No    Family History: Family History  Problem Relation Age of Onset   Hyperlipidemia Sister    Heart attack Brother 65   Cancer - Colon Neg Hx     Current Medications:  Current Outpatient Medications:    albuterol  (VENTOLIN  HFA) 108 (90 Base) MCG/ACT inhaler, Inhale 2 puffs into the lungs every 6 (six) hours as needed for  wheezing or shortness of breath., Disp: 6.7 g, Rfl: 6   apixaban  (ELIQUIS ) 5 MG TABS tablet, Take 1  tablet (5 mg total) by mouth 2 (two) times daily., Disp: 180 tablet, Rfl: 3   atorvastatin  (LIPITOR ) 80 MG tablet, Take 1 tablet (80 mg total) by mouth daily., Disp: 90 tablet, Rfl: 3   chlorthalidone  (HYGROTON ) 25 MG tablet, Take 1 tablet (25 mg total) by mouth daily., Disp: 30 tablet, Rfl: 1   Cholecalciferol  50 MCG (2000 UT) CAPS, Take 1 capsule by mouth daily., Disp: , Rfl:    cyclobenzaprine  (FLEXERIL ) 10 MG tablet, Take 1 tablet (10 mg total) by mouth 2 (two) times daily as needed. (Patient taking differently: Take 10 mg by mouth 2 (two) times daily as needed for muscle spasms.), Disp: 10 tablet, Rfl: 0   docusate sodium  (COLACE) 100 MG capsule, Take 1 capsule (100 mg total) by mouth 2 (two) times daily., Disp: 10 capsule, Rfl: 0   escitalopram  (LEXAPRO ) 10 MG tablet, Take 1 tablet (10 mg total) by mouth daily., Disp: 90 tablet, Rfl: 3   folic acid  (KP FOLIC ACID ) 1 MG tablet, Take 1 tablet (1 mg total) by mouth daily., Disp: 90 tablet, Rfl: 3   furosemide  (LASIX ) 20 MG tablet, Take 1 tablet (20 mg total) by mouth daily as needed. (Patient taking differently: Take 20 mg by mouth daily as needed for fluid or edema.), Disp: 90 tablet, Rfl: 1   HYDROcodone -acetaminophen  (NORCO/VICODIN) 5-325 MG tablet, Take 1 tablet by mouth every 6 (six) hours., Disp: , Rfl:    magnesium  oxide (MAG-OX) 400 (240 Mg) MG tablet, Take 1 tablet (400 mg total) by mouth daily., Disp: 120 tablet, Rfl: 3   meclizine  (ANTIVERT ) 25 MG tablet, Take 1 tablet (25 mg total) by mouth 3 (three) times daily as needed for dizziness., Disp: 30 tablet, Rfl: 0   methocarbamol  (ROBAXIN ) 500 MG tablet, Take 500 mg by mouth every 6 (six) hours as needed for muscle spasms., Disp: , Rfl:    metoprolol  succinate (TOPROL -XL) 25 MG 24 hr tablet, Take 1 tablet (25 mg total) by mouth daily., Disp: 90 tablet, Rfl: 3   pantoprazole  (PROTONIX ) 40 MG  tablet, Take 1 tablet (40 mg total) by mouth daily., Disp: 90 tablet, Rfl: 3   tamsulosin  (FLOMAX ) 0.4 MG CAPS capsule, Take 1 capsule (0.4 mg total) by mouth daily., Disp: 90 capsule, Rfl: 3   umeclidinium bromide  (INCRUSE ELLIPTA ) 62.5 MCG/ACT AEPB, Inhale 1 puff into the lungs daily., Disp: , Rfl:    Allergies: No Known Allergies  REVIEW OF SYSTEMS:   Review of Systems  Constitutional:  Negative for chills, fatigue and fever.  HENT:   Negative for lump/mass, mouth sores, nosebleeds, sore throat and trouble swallowing.   Eyes:  Negative for eye problems.  Respiratory:  Negative for cough and shortness of breath.   Cardiovascular:  Negative for chest pain, leg swelling and palpitations.  Gastrointestinal:  Positive for constipation. Negative for abdominal pain, diarrhea, nausea and vomiting.  Genitourinary:  Negative for bladder incontinence, difficulty urinating, dysuria, frequency, hematuria and nocturia.   Musculoskeletal:  Negative for arthralgias, back pain, flank pain, myalgias and neck pain.  Skin:  Negative for itching and rash.  Neurological:  Negative for dizziness, headaches and numbness.  Hematological:  Does not bruise/bleed easily.  Psychiatric/Behavioral:  Negative for depression, sleep disturbance and suicidal ideas. The patient is not nervous/anxious.   All other systems reviewed and are negative.    VITALS:   There were no vitals taken for this visit.  Wt Readings from Last 3 Encounters:  05/15/23 174 lb (78.9 kg)  04/25/23 174 lb (78.9 kg)  04/17/23 172 lb 3.2 oz (78.1 kg)    There is no height or weight on file to calculate BMI.  Performance status (ECOG): 1 - Symptomatic but completely ambulatory  PHYSICAL EXAM:   Physical Exam Vitals and nursing note reviewed. Exam conducted with a chaperone present.  Constitutional:      Appearance: Normal appearance.  Cardiovascular:     Rate and Rhythm: Normal rate and regular rhythm.     Pulses: Normal pulses.      Heart sounds: Normal heart sounds.  Pulmonary:     Effort: Pulmonary effort is normal.     Breath sounds: Normal breath sounds.  Abdominal:     Palpations: Abdomen is soft. There is no hepatomegaly, splenomegaly or mass.     Tenderness: There is no abdominal tenderness.  Musculoskeletal:     Right lower leg: No edema.     Left lower leg: No edema.  Lymphadenopathy:     Cervical: No cervical adenopathy.     Right cervical: No superficial, deep or posterior cervical adenopathy.    Left cervical: No superficial, deep or posterior cervical adenopathy.     Upper Body:     Right upper body: No supraclavicular or axillary adenopathy.     Left upper body: No supraclavicular or axillary adenopathy.  Neurological:     General: No focal deficit present.     Mental Status: He is alert and oriented to person, place, and time.  Psychiatric:        Mood and Affect: Mood normal.        Behavior: Behavior normal.     LABS:      Latest Ref Rng & Units 06/10/2023    2:06 PM 05/19/2023    4:39 AM 05/16/2023    4:26 AM  CBC  WBC 4.0 - 10.5 K/uL 9.5  9.1  9.9   Hemoglobin 13.0 - 17.0 g/dL 78.2  8.7  8.4   Hematocrit 39.0 - 52.0 % 31.4  26.9  25.8   Platelets 150 - 400 K/uL 199  190  136       Latest Ref Rng & Units 06/10/2023    2:06 PM 05/16/2023    4:26 AM 05/15/2023    5:21 AM  CMP  Glucose 70 - 99 mg/dL 956  213  086   BUN 8 - 23 mg/dL 21  22  19    Creatinine 0.61 - 1.24 mg/dL 5.78  4.69  6.29   Sodium 135 - 145 mmol/L 131  133  137   Potassium 3.5 - 5.1 mmol/L 3.3  4.1  3.7   Chloride 98 - 111 mmol/L 97  101  104   CO2 22 - 32 mmol/L 24  23  24    Calcium  8.9 - 10.3 mg/dL 9.1  8.5  8.8   Total Protein 6.5 - 8.1 g/dL 7.2  6.0    Total Bilirubin 0.0 - 1.2 mg/dL 0.6  0.7    Alkaline Phos 38 - 126 U/L 113  45    AST 15 - 41 U/L 26  18    ALT 0 - 44 U/L 15  9       No results found for: "CEA1", "CEA" / No results found for: "CEA1", "CEA" Lab Results  Component Value Date    PSA1 1.2 11/25/2019   No results found for: "BMW413" No results found for: "CAN125"  No results found for: "TOTALPROTELP", "ALBUMINELP", "A1GS", "A2GS", "BETS", "BETA2SER", "GAMS", "MSPIKE", "  SPEI" Lab Results  Component Value Date   TIBC 233 (L) 08/21/2022   TIBC 253 06/13/2022   TIBC 291 04/01/2022   FERRITIN 372 (H) 08/21/2022   FERRITIN 501 (H) 06/13/2022   FERRITIN 86 04/01/2022   IRONPCTSAT 37 08/21/2022   IRONPCTSAT 24 06/13/2022   IRONPCTSAT 22 04/01/2022   No results found for: "LDH"   STUDIES:   CT CHEST WO CONTRAST Result Date: 06/09/2023 CLINICAL DATA:  Non-small cell adenocarcinoma right upper lobe, follow-up and monitoring. EXAM: CT CHEST WITHOUT CONTRAST TECHNIQUE: Multidetector CT imaging of the chest was performed following the standard protocol without IV contrast. RADIATION DOSE REDUCTION: This exam was performed according to the departmental dose-optimization program which includes automated exposure control, adjustment of the mA and/or kV according to patient size and/or use of iterative reconstruction technique. COMPARISON:  Portable chest 05/14/2023, chest CTs with contrast 12/02/2022, 07/24/2022 and 04/19/2022. FINDINGS: Cardiovascular: Thoracic aorta is heavily calcified. There is no thoracic aortic aneurysm. 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 The abdominal aortic aneurysm is unchanged in caliber at the levels imaged, as is the renal artery aneurysm. The cardiac size is normal. The coronary arteries are heavily calcified. There is a small chronic anterior pericardial effusion to the right. The pulmonary arteries and veins are normal in caliber. The great vessels branch normally with moderate calcific plaques. There is a left chest port with IJ approach catheter tip in the distal SVC. Mediastinum/Nodes: No adenopathy is seen without contrast. Negative  thyroid , trachea and esophagus. Lungs/Pleura: Postsurgical changes and scarring are again noted in the right upper lobe with adjacent volume loss. There are mild centrilobular and paraseptal emphysematous changes of the lungs, chronic 3.3 cm thin walled air cyst left lower lobe. There are mild fibrotic changes in the right lower lobe along the dome of the diaphragm. Mild chronic infrahilar bronchiectasis in both lower lobes. There is subpleural reticulation over portions of both upper lobes. Asymmetric subpleural reticulation in the right lower lobe. There is a new pleural-based ovoid nodule in the lingular base, on 3:129 measuring 1.6 x 0.9 cm. Given the location and appearance, this is probably nodular atelectasis but was not present on prior studies therefore warrants a follow-up exam. There is no consolidation, effusion or pneumothorax. Upper Abdomen: Uncomplicated cholelithiasis. No acute upper abdominal findings. Elevated right hemidiaphragm. Musculoskeletal: Multiple healed fractures left rib cage. Osteopenia and degenerative change thoracic spine. No regional bone metastasis is seen. No chest wall mass. IMPRESSION: 1. Stable postsurgical changes and scarring in the right upper lobe with volume loss. 2. Emphysema and chronic interstitial change. 3. Aortic and coronary artery atherosclerosis. 4. 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 CT or MR recommended for complete evaluation of abdominal aortic aneurysm. Reference: Journal of Vascular Surgery 67.1 (2018): 2-77. J Am Coll Radiol 2013;10:789-794. 5. Cholelithiasis. 6. New pleural-based ovoid nodule in the lingular base, on 3:129 measuring 1.6 x 0.9 cm. The location and appearance suggest nodular atelectasis, but a 2 month follow-up study is recommended to ensure clearing or stability. Aortic Atherosclerosis (ICD10-I70.0) and Emphysema (ICD10-J43.9).  Electronically Signed   By: Denman Fischer M.D.   On: 06/09/2023 04:52   DG HIP UNILAT WITH PELVIS 2-3 VIEWS RIGHT Result Date: 05/16/2023 CLINICAL DATA:  Status post right hip hemiarthroplasty revision. EXAM: DG HIP (WITH OR WITHOUT PELVIS) 2-3V RIGHT  COMPARISON:  CT of the right hip dated 05/14/2023. FINDINGS: Status post right hip hemiarthroplasty revision with cerclage wire at the level of the known intertrochanteric fracture. Hardware is intact with appropriate alignment. No acute complication. Expected postoperative soft tissue swelling and air of the right hip. Lumbosacral fixation hardware in place. IMPRESSION: Status post right hip hemiarthroplasty revision with cerclage wire at the level of the known intertrochanteric fracture. No acute complication. Electronically Signed   By: Mannie Seek M.D.   On: 05/16/2023 09:37   MR LUMBAR SPINE WO CONTRAST Result Date: 05/14/2023 Table formatting from the original result was not included. [facility] NEUROIMAGING REPORT STUDY DATE: 05/13/23 PATIENT NAME: JAECE DUCHARME DOB: [birth year] MRN: [id number] ORDERING CLINICIAN: Phebe Brasil, MD CLINICAL HISTORY: 78 y.o. year old male with: 1. Cerebrovascular accident (CVA), unspecified mechanism (HCC)  2. Gait abnormality  3. Urinary incontinence, unspecified type   EXAM: MR LUMBAR SPINE WO CONTRAST TECHNIQUE: MRI of the lumbar spine was obtained utilizing multiplanar, multiecho pulse sequences. CONTRAST: Diagnostic Product Medications (last 72 hours)   None   COMPARISON: none IMAGING SITE: [facility] AT [address] [place] [address] FINDINGS: On sagittal views the vertebral bodies have normal height and alignment.  Posterior lumbar decompression and fusion with pedicle screws and interbody fusion device spanning L5 and S1 levels.  The conus medullaris terminates at the level of L1.  On axial views: T12-L1: No spinal stenosis or  foraminal narrowing L1-2: Disc bulging and facet hypertrophy with mild bilateral foraminal stenosis L2-3: Disc bulging and facet hypertrophy with mild bilateral foraminal stenosis L3-4: Disc bulging and facet hypertrophy with mild bilateral foraminal stenosis L4-5: Disc bulging and facet operatory with moderate to severe bilateral foraminal stenosis L5-S1: Disc bulging and posterior decompression, with moderate bilateral foraminal stenosis Limited views of the aorta, kidneys, iliopsoas muscles and sacroiliac joints are notable for bilateral renal cysts ranging from 0.6 to 3.0 cm in diameter.   MRI lumbar spine (without) demonstrating: - At L4-5: Disc bulging and facet hypertrophy with moderate to severe bilateral foraminal stenosis. - At L5-S1: Disc bulging and posterior decompression, with moderate bilateral foraminal stenosis; posterior lumbar decompression and fusion with metal hardware. - Bilateral renal cysts. INTERPRETING PHYSICIAN: Omega Bible, MD Certified in Neurology, Neurophysiology and Neuroimaging [facility] [address], [address] [place], [place] [address] [phone]  MR CERVICAL SPINE WO CONTRAST Result Date: 05/14/2023 Table formatting from the original result was not included. [facility] NEUROIMAGING REPORT STUDY DATE: 05/13/23 PATIENT NAME: MAKYLE ESLICK DOB: 06-03-1945 MRN: [id number] ORDERING CLINICIAN: Phebe Brasil, MD CLINICAL HISTORY: 78 y.o. year old male with: 1. Cerebrovascular accident (CVA), unspecified mechanism (HCC)  2. Gait abnormality  3. Urinary incontinence, unspecified type   EXAM: MR CERVICAL SPINE WO CONTRAST TECHNIQUE: MRI of the cervical spine was obtained utilizing multiplanar, multiecho pulse sequences. CONTRAST: Diagnostic Product Medications (last 72 hours)   None   COMPARISON: 02/26/23 CT IMAGING SITE: [facility] AT [address] [place] [address] FINDINGS: On  sagittal views the vertebral bodies have normal height and alignment.  The spinal cord is normal in size and appearance. The posterior fossa, pituitary gland and paraspinal soft tissues are unremarkable.  On axial views: C2-3 no spinal stenosis or foraminal narrowing C3-4 no spinal stenosis or foraminal narrowing C4-5 small posterior disc protrusion and facet hypertrophy with mild spinal stenosis and no foraminal narrowing C5-6  disc bulging and facet hypertrophy with moderate spinal stenosis and severe bilateral foraminal stenosis C6-7 disc bulging and facet hypertrophy with moderate spinal stenosis and mild bilateral foraminal stenosis C7-T1 no spinal stenosis or foraminal narrowing T1-2 no spinal stenosis or foraminal narrowing Limited views of the soft tissues of the head and neck are unremarkable.   MRI cervical spine without contrast demonstrating: - At C5-6 disc bulging and facet hypertrophy with moderate spinal stenosis and severe bilateral foraminal stenosis. - At C6-7 disc bulging and facet hypertrophy with moderate spinal stenosis and mild bilateral foraminal stenosis. - No cord signal abnormalities. INTERPRETING PHYSICIAN: Omega Bible, MD Certified in Neurology, Neurophysiology and Neuroimaging [facility] [address] [place], [place] [address] [phone]  CT Hip Right Wo Contrast Result Date: 05/14/2023 CLINICAL DATA:  Right hip pain after fall. Concern for periprosthetic fracture. EXAM: CT OF THE RIGHT HIP WITHOUT CONTRAST TECHNIQUE: Multidetector CT imaging of the right hip was performed according to the standard protocol. Multiplanar CT image reconstructions were also generated. RADIATION DOSE REDUCTION: This exam was performed according to the departmental dose-optimization program which includes automated exposure control, adjustment of the mA and/or kV according to patient size and/or use of iterative reconstruction technique. COMPARISON:  Right hip  radiographs dated 05/14/2023 at 11:23 a.m. FINDINGS: Bones/Joint/Cartilage Status post right hip arthroplasty with associated streak artifact which limits detailed evaluation of the surrounding bone and soft tissues. There is a fracture extending along the posterolateral proximal femoral metadiaphysis with approximately 6 mm of posterior displacement at the distal medial margin (series 3, images 89-96 and series 7, images 42-47). The femoral and acetabular prosthetic components are intact with appropriate alignment. Thin curvilinear mineralization adjacent to the posterior aspect of the posterior facet of the right greater trochanter, concerning for avulsion fracture. (Series 3, image 56 and series 7, image 43). The right sacroiliac joints and pubic symphysis are intact. Partially visualized lumbosacral fixation hardware. Ligaments Ligaments are suboptimally evaluated by CT. Soft tissue and Muscles Soft tissue swelling overlying the right lateral hip. No discrete intramuscular collection identified. Other: Air within the nondependent aspect of the distended bladder. Atherosclerotic vascular calcification. IMPRESSION: 1. Status post right hip arthroplasty with associated streak artifact which limits detailed evaluation of the surrounding bone and soft tissues. There is a mildly displaced periprosthetic fracture of the posterior proximal femoral metadiaphysis. The femoral and acetabular prosthetic components are intact with appropriate alignment. 2. Thin curvilinear mineralization adjacent to the posterior aspect of the posterior facet of the right greater trochanter, concerning for avulsion fracture. 3. Air within the nondependent aspect of the distended bladder may be secondary to recent instrumentation or infection. Clinical correlation is recommended. Electronically Signed   By: Mannie Seek M.D.   On: 05/14/2023 15:07   DG Chest 1 View Result Date: 05/14/2023 CLINICAL DATA:  Chest pain EXAM: CHEST  1 VIEW  COMPARISON:  March 10, 2023 FINDINGS: The heart size and mediastinal contours are within normal limits. Both lungs are clear. The visualized skeletal structures are unremarkable. Left IJ Infuse-A-Port catheter tip in the SVC No change in the chronic right apical pleural thickening. IMPRESSION: No acute cardiopulmonary process. Electronically Signed   By: Fredrich Jefferson M.D.   On: 05/14/2023 11:52   DG Hip Unilat W or Wo Pelvis 2-3 Views Right Result Date: 05/14/2023 CLINICAL DATA:  Status post fall and pain EXAM: DG HIP (WITH OR WITHOUT PELVIS) 2-3V RIGHT COMPARISON:  None Available. FINDINGS: There is no evidence of hip fracture  or dislocation. There is no evidence of arthropathy or other focal bone abnormality. IMPRESSION: Total right hip arthroplasty., near anatomic alignment. No fractures Electronically Signed   By: Fredrich Jefferson M.D.   On: 05/14/2023 11:51   CT Head Wo Contrast Result Date: 05/14/2023 CLINICAL DATA:  Head trauma. EXAM: CT HEAD WITHOUT CONTRAST TECHNIQUE: Contiguous axial images were obtained from the base of the skull through the vertex without intravenous contrast. RADIATION DOSE REDUCTION: This exam was performed according to the departmental dose-optimization program which includes automated exposure control, adjustment of the mA and/or kV according to patient size and/or use of iterative reconstruction technique. COMPARISON:  Head CT dated 03/10/2023 FINDINGS: Brain: Moderate age-related atrophy and chronic microvascular ischemic changes. Right basal ganglia old infarct as well as an old lacunar infarct versus a cyst in the left lentiform nucleus similar to prior CT. There is no acute intracranial hemorrhage. No mass effect or midline shift. No extra-axial fluid collection. Vascular: No hyperdense vessel or unexpected calcification. Skull: Normal. Negative for fracture or focal lesion. Sinuses/Orbits: The visualized paranasal sinuses are clear. Bilateral mastoid effusions. Other:  None IMPRESSION: 1. No acute intracranial pathology. 2. Moderate age-related atrophy and chronic microvascular ischemic changes. Electronically Signed   By: Angus Bark M.D.   On: 05/14/2023 11:37

## 2023-06-11 ENCOUNTER — Ambulatory Visit: Admitting: Internal Medicine

## 2023-06-15 ENCOUNTER — Other Ambulatory Visit: Payer: Self-pay | Admitting: Family Medicine

## 2023-06-16 ENCOUNTER — Encounter: Payer: Self-pay | Admitting: Hematology

## 2023-06-16 ENCOUNTER — Other Ambulatory Visit (HOSPITAL_COMMUNITY): Payer: Self-pay

## 2023-06-16 DIAGNOSIS — I739 Peripheral vascular disease, unspecified: Secondary | ICD-10-CM

## 2023-06-16 DIAGNOSIS — I110 Hypertensive heart disease with heart failure: Secondary | ICD-10-CM | POA: Diagnosis not present

## 2023-06-16 DIAGNOSIS — K219 Gastro-esophageal reflux disease without esophagitis: Secondary | ICD-10-CM

## 2023-06-16 DIAGNOSIS — N401 Enlarged prostate with lower urinary tract symptoms: Secondary | ICD-10-CM

## 2023-06-16 DIAGNOSIS — I5042 Chronic combined systolic (congestive) and diastolic (congestive) heart failure: Secondary | ICD-10-CM | POA: Diagnosis not present

## 2023-06-16 DIAGNOSIS — M51369 Other intervertebral disc degeneration, lumbar region without mention of lumbar back pain or lower extremity pain: Secondary | ICD-10-CM

## 2023-06-16 DIAGNOSIS — M48061 Spinal stenosis, lumbar region without neurogenic claudication: Secondary | ICD-10-CM

## 2023-06-16 DIAGNOSIS — I480 Paroxysmal atrial fibrillation: Secondary | ICD-10-CM | POA: Diagnosis not present

## 2023-06-16 DIAGNOSIS — S2242XD Multiple fractures of ribs, left side, subsequent encounter for fracture with routine healing: Secondary | ICD-10-CM | POA: Diagnosis not present

## 2023-06-16 DIAGNOSIS — J449 Chronic obstructive pulmonary disease, unspecified: Secondary | ICD-10-CM

## 2023-06-16 DIAGNOSIS — G8929 Other chronic pain: Secondary | ICD-10-CM

## 2023-06-16 DIAGNOSIS — F32A Depression, unspecified: Secondary | ICD-10-CM

## 2023-06-17 ENCOUNTER — Other Ambulatory Visit (HOSPITAL_COMMUNITY): Payer: Self-pay

## 2023-06-17 ENCOUNTER — Other Ambulatory Visit: Payer: Self-pay

## 2023-06-17 MED ORDER — ATORVASTATIN CALCIUM 80 MG PO TABS
80.0000 mg | ORAL_TABLET | Freq: Every day | ORAL | 3 refills | Status: DC
  Filled 2023-06-17: qty 90, 90d supply, fill #0
  Filled 2023-09-16: qty 90, 90d supply, fill #1

## 2023-06-17 MED ORDER — PANTOPRAZOLE SODIUM 40 MG PO TBEC
40.0000 mg | DELAYED_RELEASE_TABLET | Freq: Every day | ORAL | 3 refills | Status: DC
  Filled 2023-06-17: qty 90, 90d supply, fill #0
  Filled 2023-09-16: qty 90, 90d supply, fill #1

## 2023-06-23 ENCOUNTER — Ambulatory Visit (INDEPENDENT_AMBULATORY_CARE_PROVIDER_SITE_OTHER): Admitting: Nurse Practitioner

## 2023-06-23 ENCOUNTER — Encounter: Payer: Self-pay | Admitting: Nurse Practitioner

## 2023-06-23 VITALS — BP 102/62 | HR 63 | Ht 70.0 in | Wt 163.0 lb

## 2023-06-23 DIAGNOSIS — J449 Chronic obstructive pulmonary disease, unspecified: Secondary | ICD-10-CM | POA: Diagnosis not present

## 2023-06-23 DIAGNOSIS — R490 Dysphonia: Secondary | ICD-10-CM

## 2023-06-23 DIAGNOSIS — K219 Gastro-esophageal reflux disease without esophagitis: Secondary | ICD-10-CM

## 2023-06-23 DIAGNOSIS — C3411 Malignant neoplasm of upper lobe, right bronchus or lung: Secondary | ICD-10-CM | POA: Diagnosis not present

## 2023-06-23 DIAGNOSIS — R0982 Postnasal drip: Secondary | ICD-10-CM

## 2023-06-23 DIAGNOSIS — S72001S Fracture of unspecified part of neck of right femur, sequela: Secondary | ICD-10-CM

## 2023-06-23 DIAGNOSIS — Z87891 Personal history of nicotine dependence: Secondary | ICD-10-CM

## 2023-06-23 MED ORDER — FLUTICASONE PROPIONATE 50 MCG/ACT NA SUSP
2.0000 | Freq: Every day | NASAL | 2 refills | Status: DC
  Filled 2023-06-23 – 2023-09-16 (×2): qty 16, 30d supply, fill #0

## 2023-06-23 NOTE — Assessment & Plan Note (Signed)
 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 No recent exacerbations. Voice hoarseness possibly related to DPI. He will change back to Spiriva  Respimat once d/c from rehab. Advised to notify if symptoms persist. Target postnasal drainage and continue GERD management. Action plan in place. Encouraged graded exercises. Trigger prevention.   Patient Instructions  Continue Albuterol  inhaler 2 puffs every 6 hours as needed for shortness of breath or wheezing. Notify if symptoms persist despite rescue inhaler/neb use.  Continue Incruse 1 puff daily or Spiriva  2 puffs daily. Do not use both. You can switch back to the Spiriva  once you go home  Try flonase nasal spray 2 sprays each nostril daily for nasal drainage and see how this helps your voice hoarseness Continue pantoprazole  (protonix ) daily for reflux    Attend CT scan of the chest in August as scheduled  Follow up with oncology afterwards   Follow up in one year with Dr. Baldwin Ward or Darryl Ward. If symptoms worsen, please contact office for sooner follow up or seek emergency care.

## 2023-06-23 NOTE — Progress Notes (Signed)
 @Patient  ID: Darryl Ward, male    DOB: [birth year], 78 y.o.   MRN: [id number]  Chief Complaint  Patient presents with   Follow-up    Referring provider: Cook, Jayce G, DO  HPI: 78 year old male, former smoker followed for COPD. He is a patient of Dr. Lacinda Pica and last seen in office 07/11/2022 by [facility] NP. 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 Cheree Cords. Past medical history significant for CAD, AAA, PAF on Eliquis , HTN, CHF, BPH, HLD, IDA.   TEST/EVENTS:  07/03/2021 PFT: FVC 108, FEV1 100, ratio 67, TLC 102, DLCOunc 47. No BD 03/04/2022 echo: EF 55%. GIIDD. RV size and function nl. Nl PASP. LA mildly dilated. Mild MR. Borderline dilatation of aortic root, 40 mmg 04/19/2022 CT chest w contrast: atherosclerosis. S/p right upper lobectomy.small amount of pleural fluid and gas in apex of right hemithorax, favored to be chronic. New pleural based nodule 9x6 mm in RLL. New 5 mm LLL nodule. Aneurysmal dilatation of the abdominal aorta.  Simple cysts both kidneys. 06/03/2023 CT chest: heavily calcified aorta. Right renal aneurysm, partially calcified. AAA stable. CAD. Mild emphysema. Postsurgical changes in RUL with volume loss. Thin walled 3.3 cm air cyst LLL, unchanged. Fibrotic changes in RLL. Mild infrahilar btx in b/l lower lobes. Subpleural reticulation in upper lobes. Asymmetric reticulation in RLL. New pleural based ovoid nodule in lingular base, 1.6x.0.9 cm. Probably atelectasis. Multiple healed rib fx.   01/23/2022: OV with Dr. Baldwin Levee.  Since he was seen last, he underwent robotic assisted right upper lobectomy.  He had prolonged air leak and had endobronchial valves placed on 11/22/2021.  Planning for endobronchial valve removal with Dr. Luna Salinas on 1/24.  Reports that he gets some exertional shortness of breath.  Questions that this is associated with his A-fib.   He coughs rarely.  No sputum.  Started on Spiriva .  Given an albuterol  inhaler for rescue.  Follow-up with oncology as scheduled.  07/11/2022: OV with Keighley Deckman NP for follow-up.  Since he was here last he had a fall and broke his right hip requiring surgery.  He was discharged to rehab.  Was able to come home in May.  Has been doing relatively well since.  Does not feel like he is having any difficulties with his breathing.  No significant cough.  Using his Spiriva  daily.  Denies any chest congestion or wheezing.  Has follow-up CT scan in July.  06/23/2023: Today - follow up Patient presents today for follow up with his significant other. Since his last visit, he fell and broke his hip again in April 2025. Same hip as last year. He was in the hospital and is now at rehab. Slowly improving but still weak overall. Breathing feels stable otherwise. No issues with exacerbations requiring steroids and antibiotics. No significant cough. Does have some voice hoarseness. No issues with reflux. Occasional postnasal drainage. Right now, he is using Incruse at the facility but he's typically on spiriva  at home. Does prefer this.  He's under surveillance for lung cancer. Repeat CT is planned for August 2025. He did have a new ovoid nodule in lingular base, felt to be atelectasis. No hemoptysis, weight loss, anorexia, weight loss.   No Known Allergies  Immunization History  Administered Date(s) Administered   Fluad Quad(high Dose 65+) 11/25/2019   Fluad Trivalent(High Dose 65+) 09/25/2022   Influenza, High Dose Seasonal PF  11/08/2016, 11/13/2017, 10/09/2018, 10/25/2020   Influenza-Unspecified 10/22/2021   Moderna Covid-19 Vaccine Bivalent Booster 71yrs & up 10/25/2020   Moderna Sars-Covid-2 Vaccination 03/04/2019, 04/01/2019, 12/05/2019, 08/02/2020   PFIZER Comirnaty(Gray Top)Covid-19 Tri-Sucrose Vaccine 02/11/2022   Pneumococcal Conjugate-13 11/25/2019   Pneumococcal Polysaccharide-23 10/09/2018   Zoster  Recombinant(Shingrix) 10/09/2018, 10/09/2018, 01/01/2019    Past Medical History:  Diagnosis Date   AAA (abdominal aortic aneurysm) (HCC)    Arthritis    Broken hip (HCC)    Cancer (HCC)    Carotid artery disease (HCC)    Nonobstructive   Cataract    COPD (chronic obstructive pulmonary disease) (HCC)    Coronary atherosclerosis of native coronary artery    PTCA small diagonal 2007 otherwise nonobstructive CAD   Depression    Dysrhythmia    Essential hypertension, benign    Hyperlipidemia    NSTEMI (non-ST elevated myocardial infarction) (HCC) 2007   Stroke [facility]) 2004   Stroke [facility]) 2025   TIA (transient ischemic attack) 2006    Tobacco History: Social History   Tobacco Use  Smoking Status Former   Current packs/day: 0.00   Average packs/day: 1 pack/day for 40.0 years (40.0 ttl pk-yrs)   Types: Cigarettes   Start date: 07/1981   Quit date: 07/2021   Years since quitting: 1.9  Smokeless Tobacco Never  Tobacco Comments   1 pack of cigarettes smoked daily. 07/17/21 ARJ, RN    Counseling given: Not Answered Tobacco comments: 1 pack of cigarettes smoked daily. 07/17/21 ARJ, RN    Outpatient Medications Prior to Visit  Medication Sig Dispense Refill   albuterol  (VENTOLIN  HFA) 108 (90 Base) MCG/ACT inhaler Inhale 2 puffs into the lungs every 6 (six) hours as needed for wheezing or shortness of breath. 6.7 g 6   apixaban  (ELIQUIS ) 5 MG TABS tablet Take 1 tablet (5 mg total) by mouth 2 (two) times daily. 180 tablet 3   atorvastatin  (LIPITOR ) 80 MG tablet Take 1 tablet (80 mg total) by mouth daily. 90 tablet 3   chlorthalidone  (HYGROTON ) 25 MG tablet Take 1 tablet (25 mg total) by mouth daily. 30 tablet 1   Cholecalciferol  50 MCG (2000 UT) CAPS Take 1 capsule by mouth daily.     cyclobenzaprine  (FLEXERIL ) 10 MG tablet Take 1 tablet (10 mg total) by mouth 2 (two) times daily as needed. (Patient taking differently: Take 10 mg by mouth 2 (two) times daily as needed for muscle  spasms.) 10 tablet 0   docusate sodium  (COLACE) 100 MG capsule Take 1 capsule (100 mg total) by mouth 2 (two) times daily. 10 capsule 0   escitalopram  (LEXAPRO ) 10 MG tablet Take 1 tablet (10 mg total) by mouth daily. 90 tablet 3   folic acid  (KP FOLIC ACID ) 1 MG tablet Take 1 tablet (1 mg total) by mouth daily. 90 tablet 3   furosemide  (LASIX ) 20 MG tablet Take 1 tablet (20 mg total) by mouth daily as needed. (Patient taking differently: Take 20 mg by mouth daily as needed for fluid or edema.) 90 tablet 1   HYDROcodone -acetaminophen  (NORCO/VICODIN) 5-325 MG tablet Take 1 tablet by mouth every 6 (six) hours.     magnesium  oxide (MAG-OX) 400 (240 Mg) MG tablet Take 1 tablet (400 mg total) by mouth daily. 120 tablet 3   meclizine  (ANTIVERT ) 25 MG tablet Take 1 tablet (25 mg total) by mouth 3 (three) times daily as needed for dizziness. 30 tablet 0   methocarbamol  (ROBAXIN ) 500 MG tablet Take 500 mg by mouth  every 6 (six) hours as needed for muscle spasms.     metoprolol  succinate (TOPROL -XL) 25 MG 24 hr tablet Take 1 tablet (25 mg total) by mouth daily. 90 tablet 3   pantoprazole  (PROTONIX ) 40 MG tablet Take 1 tablet (40 mg total) by mouth daily. 90 tablet 3   tamsulosin  (FLOMAX ) 0.4 MG CAPS capsule Take 1 capsule (0.4 mg total) by mouth daily. 90 capsule 3   umeclidinium bromide  (INCRUSE ELLIPTA ) 62.5 MCG/ACT AEPB Inhale 1 puff into the lungs daily.     No facility-administered medications prior to visit.     Review of Systems:   Constitutional: No weight loss or gain, night sweats, fevers, chills + lassitude, fatigue (baseline) HEENT: No headaches, difficulty swallowing, tooth/dental problems, or sore throat. No sneezing, itching, ear ache, nasal congestion, or post nasal drip +occasional voice hoarseness  CV:  No chest pain, orthopnea, PND, swelling in lower extremities, anasarca, dizziness, palpitations, syncope Resp: +shortness of breath with exertion (minimal; baseline). No cough. No  excess mucus or change in color of mucus. No hemoptysis. No wheezing.  No chest wall deformity GI:  No heartburn, indigestion.  MSK: +occasional right hip pain Neuro: No dizziness or lightheadedness.  Psych: No depression or anxiety. Mood stable.     Physical Exam:  BP 102/62 (BP Location: Right Arm, Patient Position: Sitting, Cuff Size: Normal)   Pulse 63   Ht 5\' 10"  (1.778 m)   Wt 163 lb (73.9 kg)   SpO2 100%   BMI 23.39 kg/m   GEN: Pleasant, interactive, chronically-ill appearing; in no acute distress. HEENT:  Normocephalic and atraumatic. PERRLA. Sclera white. Nasal turbinates pink, moist and patent bilaterally. No rhinorrhea present. Oropharynx pink and moist, without exudate or edema. No lesions, ulcerations, or postnasal drip.  NECK:  Supple w/ fair ROM. No JVD present. Normal carotid impulses w/o bruits. Thyroid  symmetrical with no goiter or nodules palpated. No lymphadenopathy.   CV: RRR, no m/r/g, no peripheral edema. Pulses intact, +2 bilaterally. No cyanosis, pallor or clubbing. PULMONARY:  Unlabored, regular breathing. Clear bilaterally A&P w/o wheezes/rales/rhonchi. No accessory muscle use.  GI: BS present and normoactive. Soft, non-tender to palpation. No organomegaly or masses detected. MSK: No erythema, warmth or tenderness. Cap refil <2 sec all extrem. No deformities or joint swelling noted. Muscle wasting Neuro: A/Ox3. No focal deficits noted.   Skin: Warm, no lesions or rashe Psych: Normal affect and behavior. Judgement and thought content appropriate.     Lab Results:  CBC    Component Value Date/Time   WBC 9.5 06/10/2023 1406   RBC 3.30 (L) 06/10/2023 1406   HGB 10.0 (L) 06/10/2023 1406   HGB 10.1 (L) 06/12/2022 1620   HCT 31.4 (L) 06/10/2023 1406   HCT 29.8 (L) 06/12/2022 1620   PLT 199 06/10/2023 1406   PLT 135 (L) 06/12/2022 1620   MCV 95.2 06/10/2023 1406   MCV 92 06/12/2022 1620   MCH 30.3 06/10/2023 1406   MCHC 31.8 06/10/2023 1406   RDW  14.4 06/10/2023 1406   RDW 13.7 06/12/2022 1620   LYMPHSABS 0.9 06/10/2023 1406   LYMPHSABS 2.1 07/13/2020 1006   MONOABS 0.5 06/10/2023 1406   EOSABS 0.2 06/10/2023 1406   EOSABS 0.1 07/13/2020 1006   BASOSABS 0.1 06/10/2023 1406   BASOSABS 0.1 07/13/2020 1006    BMET    Component Value Date/Time   NA 131 (L) 06/10/2023 1406   NA 142 04/25/2023 0927   K 3.3 (L) 06/10/2023 1406   CL 97 (  L) 06/10/2023 1406   CO2 24 06/10/2023 1406   GLUCOSE 140 (H) 06/10/2023 1406   BUN 21 06/10/2023 1406   BUN 26 04/25/2023 0927   CREATININE 1.17 06/10/2023 1406   CALCIUM  9.1 06/10/2023 1406   GFRNONAA >60 06/10/2023 1406   GFRAA 89 02/15/2020 1145    BNP    Component Value Date/Time   BNP 62.8 12/04/2021 2052     Imaging:  CT CHEST WO CONTRAST Result Date: 06/09/2023 CLINICAL DATA:  Non-small cell adenocarcinoma right upper lobe, follow-up and monitoring. EXAM: CT CHEST WITHOUT CONTRAST TECHNIQUE: Multidetector CT imaging of the chest was performed following the standard protocol without IV contrast. RADIATION DOSE REDUCTION: This exam was performed according to the departmental dose-optimization program which includes automated exposure control, adjustment of the mA and/or kV according to patient size and/or use of iterative reconstruction technique. COMPARISON:  Portable chest 05/14/2023, chest CTs with contrast 12/02/2022, 07/24/2022 and 04/19/2022. FINDINGS: Cardiovascular: Thoracic aorta is heavily calcified. There is no thoracic aortic aneurysm. 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 The abdominal aortic aneurysm is unchanged in caliber at the levels imaged, as is the renal artery aneurysm. The cardiac size is normal. The coronary arteries are heavily calcified. There is a small chronic anterior pericardial effusion to the right. The pulmonary arteries and veins are normal in  caliber. The great vessels branch normally with moderate calcific plaques. There is a left chest port with IJ approach catheter tip in the distal SVC. Mediastinum/Nodes: No adenopathy is seen without contrast. Negative thyroid , trachea and esophagus. Lungs/Pleura: Postsurgical changes and scarring are again noted in the right upper lobe with adjacent volume loss. There are mild centrilobular and paraseptal emphysematous changes of the lungs, chronic 3.3 cm thin walled air cyst left lower lobe. There are mild fibrotic changes in the right lower lobe along the dome of the diaphragm. Mild chronic infrahilar bronchiectasis in both lower lobes. There is subpleural reticulation over portions of both upper lobes. Asymmetric subpleural reticulation in the right lower lobe. There is a new pleural-based ovoid nodule in the lingular base, on 3:129 measuring 1.6 x 0.9 cm. Given the location and appearance, this is probably nodular atelectasis but was not present on prior studies therefore warrants a follow-up exam. There is no consolidation, effusion or pneumothorax. Upper Abdomen: Uncomplicated cholelithiasis. No acute upper abdominal findings. Elevated right hemidiaphragm. Musculoskeletal: Multiple healed fractures left rib cage. Osteopenia and degenerative change thoracic spine. No regional bone metastasis is seen. No chest wall mass. IMPRESSION: 1. Stable postsurgical changes and scarring in the right upper lobe with volume loss. 2. Emphysema and chronic interstitial change. 3. Aortic and coronary artery atherosclerosis. 4. 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 CT or MR recommended for complete evaluation of abdominal aortic aneurysm. Reference: Journal of Vascular Surgery 67.1 (2018): 2-77. J Am Coll Radiol 2013;10:789-794. 5. Cholelithiasis. 6. New pleural-based ovoid nodule in the lingular base, on 3:129 measuring 1.6 x  0.9 cm. The location and appearance suggest nodular atelectasis, but a 2 month follow-up study is recommended to ensure clearing or stability. Aortic Atherosclerosis (ICD10-I70.0) and Emphysema (ICD10-J43.9). Electronically Signed   By: Denman Fischer M.D.   On: 06/09/2023 04:52    heparin  lock flush 100 unit/mL     Date Action Dose Route User   06/10/2023 1357 Given 500 Units  Intravenous Page, Danna Duster, RN      sodium chloride  flush (NS) 0.9 % injection 10 mL     Date Action Dose Route User   06/10/2023 1357 Given 10 mL Intravenous Page, Danna Duster, RN          Latest Ref Rng & Units 07/03/2021   12:35 PM  PFT Results  FVC-Pre L 4.65   FVC-Predicted Pre % 108   FVC-Post L 4.75   FVC-Predicted Post % 111   Pre FEV1/FVC % % 67   Post FEV1/FCV % % 67   FEV1-Pre L 3.11   FEV1-Predicted Pre % 100   FEV1-Post L 3.20   DLCO uncorrected ml/min/mmHg 11.91   DLCO UNC% % 47   DLVA Predicted % 59   TLC L 7.19   TLC % Predicted % 102   RV % Predicted % 122     No results found for: "NITRICOXIDE"      Assessment & Plan:   COPD (chronic obstructive pulmonary disease) (HCC) 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 No recent exacerbations. Voice hoarseness possibly related to DPI. He will change back to Spiriva  Respimat once d/c from rehab. Advised to notify if symptoms persist. Target postnasal drainage and continue GERD management. Action plan in place. Encouraged graded exercises. Trigger prevention.   Patient Instructions  Continue Albuterol  inhaler 2 puffs every 6 hours as needed for shortness of breath or wheezing. Notify if symptoms persist despite rescue inhaler/neb use.  Continue Incruse 1 puff daily or Spiriva  2 puffs daily. Do not use both. You can switch back to the Spiriva  once you go home  Try flonase nasal spray 2 sprays each nostril daily for nasal drainage and see how this helps your voice hoarseness Continue pantoprazole  (protonix ) daily for reflux     Attend CT scan of the chest in August as scheduled  Follow up with oncology afterwards   Follow up in one year with Dr. Baldwin Levee or Gina Lagos. If symptoms worsen, please contact office for sooner follow up or seek emergency care.    Primary adenocarcinoma of upper lobe of right lung (HCC) No evidence of disease recurrence on recent CT. New ovoid nodule in lingula, suspected to be atelectasis. Plan to repeat August 2025 for 3 month follow up. Follow up with oncology as scheduled.   GERD without esophagitis No breakthrough symptoms. Continue PPI  Voice hoarseness See above  Closed right hip fracture (HCC) Current in rehab. Plans to go home within the month. Encouraged to continue working with PT.     I spent 35 minutes of dedicated to the care of this patient on the date of this encounter to include pre-visit review of records, face-to-face time with the patient discussing conditions above, post visit ordering of testing, clinical documentation with the electronic health record, making appropriate referrals as documented, and communicating necessary findings to members of the patients care team.  Roetta Clarke, NP 06/23/2023  Pt aware and understands NP's role.

## 2023-06-23 NOTE — Assessment & Plan Note (Signed)
 See above

## 2023-06-23 NOTE — Assessment & Plan Note (Signed)
 No evidence of disease recurrence on recent CT. New ovoid nodule in lingula, suspected to be atelectasis. Plan to repeat August 2025 for 3 month follow up. Follow up with oncology as scheduled.

## 2023-06-23 NOTE — Assessment & Plan Note (Signed)
 No breakthrough symptoms. Continue PPI

## 2023-06-23 NOTE — Patient Instructions (Signed)
 Continue Albuterol  inhaler 2 puffs every 6 hours as needed for shortness of breath or wheezing. Notify if symptoms persist despite rescue inhaler/neb use.  Continue Incruse 1 puff daily or Spiriva  2 puffs daily. Do not use both. You can switch back to the Spiriva  once you go home  Try flonase nasal spray 2 sprays each nostril daily for nasal drainage and see how this helps your voice hoarseness Continue pantoprazole  (protonix ) daily for reflux    Attend CT scan of the chest in August as scheduled  Follow up with oncology afterwards   Follow up in one year with Dr. Baldwin Levee or Gina Lagos. If symptoms worsen, please contact office for sooner follow up or seek emergency care.

## 2023-06-23 NOTE — Assessment & Plan Note (Signed)
 Current in rehab. Plans to go home within the month. Encouraged to continue working with PT.

## 2023-06-24 ENCOUNTER — Other Ambulatory Visit: Payer: Self-pay

## 2023-06-24 ENCOUNTER — Other Ambulatory Visit (HOSPITAL_COMMUNITY): Payer: Self-pay

## 2023-07-03 ENCOUNTER — Encounter (INDEPENDENT_AMBULATORY_CARE_PROVIDER_SITE_OTHER): Payer: Self-pay

## 2023-07-03 ENCOUNTER — Ambulatory Visit: Admitting: Family Medicine

## 2023-07-11 ENCOUNTER — Other Ambulatory Visit (HOSPITAL_COMMUNITY): Payer: Self-pay

## 2023-07-28 ENCOUNTER — Ambulatory Visit: Admitting: Family Medicine

## 2023-08-25 ENCOUNTER — Other Ambulatory Visit (HOSPITAL_COMMUNITY): Payer: Self-pay | Admitting: Orthopedic Surgery

## 2023-08-25 ENCOUNTER — Ambulatory Visit (HOSPITAL_COMMUNITY)
Admission: RE | Admit: 2023-08-25 | Discharge: 2023-08-25 | Disposition: A | Discharge status: Home / Self Care | Source: Ambulatory Visit | Attending: Surgery | Admitting: Surgery

## 2023-08-25 DIAGNOSIS — M79605 Pain in left leg: Secondary | ICD-10-CM | POA: Insufficient documentation

## 2023-09-05 ENCOUNTER — Ambulatory Visit: Payer: HMO

## 2023-09-10 ENCOUNTER — Inpatient Hospital Stay: Attending: Hematology

## 2023-09-10 ENCOUNTER — Ambulatory Visit (HOSPITAL_COMMUNITY)
Admission: RE | Admit: 2023-09-10 | Discharge: 2023-09-10 | Disposition: A | Discharge status: Home / Self Care | Source: Ambulatory Visit | Attending: Hematology | Admitting: Hematology

## 2023-09-10 DIAGNOSIS — Z08 Encounter for follow-up examination after completed treatment for malignant neoplasm: Secondary | ICD-10-CM | POA: Diagnosis present

## 2023-09-10 DIAGNOSIS — C3411 Malignant neoplasm of upper lobe, right bronchus or lung: Secondary | ICD-10-CM | POA: Insufficient documentation

## 2023-09-10 DIAGNOSIS — Z9221 Personal history of antineoplastic chemotherapy: Secondary | ICD-10-CM | POA: Diagnosis not present

## 2023-09-10 DIAGNOSIS — D509 Iron deficiency anemia, unspecified: Secondary | ICD-10-CM

## 2023-09-10 DIAGNOSIS — Z85118 Personal history of other malignant neoplasm of bronchus and lung: Secondary | ICD-10-CM | POA: Insufficient documentation

## 2023-09-10 DIAGNOSIS — D649 Anemia, unspecified: Secondary | ICD-10-CM | POA: Diagnosis not present

## 2023-09-10 DIAGNOSIS — I714 Abdominal aortic aneurysm, without rupture, unspecified: Secondary | ICD-10-CM | POA: Diagnosis not present

## 2023-09-10 LAB — CBC WITH DIFFERENTIAL/PLATELET
Abs Immature Granulocytes: 0.01 K/uL (ref 0.00–0.07)
Basophils Absolute: 0.1 K/uL (ref 0.0–0.1)
Basophils Relative: 1 %
Eosinophils Absolute: 0.3 K/uL (ref 0.0–0.5)
Eosinophils Relative: 4 %
HCT: 34.4 % — ABNORMAL LOW (ref 39.0–52.0)
Hemoglobin: 11.3 g/dL — ABNORMAL LOW (ref 13.0–17.0)
Immature Granulocytes: 0 %
Lymphocytes Relative: 21 %
Lymphs Abs: 1.3 K/uL (ref 0.7–4.0)
MCH: 31 pg (ref 26.0–34.0)
MCHC: 32.8 g/dL (ref 30.0–36.0)
MCV: 94.5 fL (ref 80.0–100.0)
Monocytes Absolute: 0.6 K/uL (ref 0.1–1.0)
Monocytes Relative: 10 %
Neutro Abs: 4 K/uL (ref 1.7–7.7)
Neutrophils Relative %: 64 %
Platelets: 170 K/uL (ref 150–400)
RBC: 3.64 MIL/uL — ABNORMAL LOW (ref 4.22–5.81)
RDW: 14.3 % (ref 11.5–15.5)
WBC: 6.3 K/uL (ref 4.0–10.5)
nRBC: 0 % (ref 0.0–0.2)

## 2023-09-10 LAB — IRON AND TIBC
Iron: 51 ug/dL (ref 45–182)
Saturation Ratios: 17 % — ABNORMAL LOW (ref 17.9–39.5)
TIBC: 296 ug/dL (ref 250–450)
UIBC: 245 ug/dL

## 2023-09-10 LAB — COMPREHENSIVE METABOLIC PANEL WITH GFR
ALT: 14 U/L (ref 0–44)
AST: 20 U/L (ref 15–41)
Albumin: 3.4 g/dL — ABNORMAL LOW (ref 3.5–5.0)
Alkaline Phosphatase: 80 U/L (ref 38–126)
Anion gap: 13 (ref 5–15)
BUN: 26 mg/dL — ABNORMAL HIGH (ref 8–23)
CO2: 22 mmol/L (ref 22–32)
Calcium: 9.3 mg/dL (ref 8.9–10.3)
Chloride: 103 mmol/L (ref 98–111)
Creatinine, Ser: 1.33 mg/dL — ABNORMAL HIGH (ref 0.61–1.24)
GFR, Estimated: 55 mL/min — ABNORMAL LOW (ref >60)
Glucose, Bld: 77 mg/dL (ref 70–99)
Potassium: 3.6 mmol/L (ref 3.5–5.1)
Sodium: 138 mmol/L (ref 135–145)
Total Bilirubin: 0.5 mg/dL (ref 0.0–1.2)
Total Protein: 6.8 g/dL (ref 6.5–8.1)

## 2023-09-10 LAB — FERRITIN: Ferritin: 148 ng/mL (ref 24–336)

## 2023-09-10 NOTE — Progress Notes (Signed)
 Darryl Ward presented for Portacath access and flush.  Portacath located left chest wall accessed with  H 20 needle.  Good blood return present. Portacath flushed with 20ml NS left intact for CT at 14:00 pm.   Procedure tolerated well and without incident.    Discharged from clinic by wheel chair in stable condition. Alert and oriented x 3. F/U with [facility] as scheduled.

## 2023-09-10 NOTE — Patient Instructions (Signed)
 [facility] [place] - A DEPT OF [facility]  Discharge Instructions: Thank you for choosing [facility] to provide your oncology and hematology care.  If you have a lab appointment with the Cancer Center - please note that after April 8th, 2024, all labs will be drawn in the cancer center.  You do not have to check in or register with the main entrance as you have in the past but will complete your check-in in the cancer center.  Wear comfortable clothing and clothing appropriate for easy access to any Portacath or PICC line.   We strive to give you quality time with your provider. You may need to reschedule your appointment if you arrive late (15 or more minutes).  Arriving late affects you and other patients whose appointments are after yours.  Also, if you miss three or more appointments without notifying the office, you may be dismissed from the clinic at the provider's discretion.      For prescription refill requests, have your pharmacy contact our office and allow 72 hours for refills to be completed.    Today you received the following port flush with labs for CT scan   To help prevent nausea and vomiting after your treatment, we encourage you to take your nausea medication as directed.  BELOW ARE SYMPTOMS THAT SHOULD BE REPORTED IMMEDIATELY: *FEVER GREATER THAN 100.4 F (38 C) OR HIGHER *CHILLS OR SWEATING *NAUSEA AND VOMITING THAT IS NOT CONTROLLED WITH YOUR NAUSEA MEDICATION *UNUSUAL SHORTNESS OF BREATH *UNUSUAL BRUISING OR BLEEDING *URINARY PROBLEMS (pain or burning when urinating, or frequent urination) *BOWEL PROBLEMS (unusual diarrhea, constipation, pain near the anus) TENDERNESS IN MOUTH AND THROAT WITH OR WITHOUT PRESENCE OF ULCERS (sore throat, sores in mouth, or a toothache) UNUSUAL RASH, SWELLING OR PAIN  UNUSUAL VAGINAL DISCHARGE OR ITCHING   Items with * indicate a potential emergency and should be followed up as soon as possible  or go to the Emergency Department if any problems should occur.  Please show the CHEMOTHERAPY ALERT CARD or IMMUNOTHERAPY ALERT CARD at check-in to the Emergency Department and triage nurse.  Should you have questions after your visit or need to cancel or reschedule your appointment, please contact [facility]  - A DEPT OF Eligh[facility] Brideg[facility] [phone]  and follow the prompts.  Office hours are 8:00 a.m. to 4:30 p.m. Monday - Friday. Please note that voicemails left after 4:00 p.m. may not be returned until the following business day.  We are closed weekends and major holidays. You have access to a nurse at all times for urgent questions. Please call the main number to the clinic [phone] and follow the prompts.  For any non-urgent questions, you may also contact your provider using MyChart. We now offer e-Visits for anyone 41 and older to request care online for non-urgent symptoms. For details visit [email or web address].   Also download the MyChart app! Go to the app store, search "MyChart", open the app, select [place], and log in with your MyChart username and password.

## 2023-09-16 ENCOUNTER — Other Ambulatory Visit (HOSPITAL_COMMUNITY): Payer: Self-pay

## 2023-09-16 ENCOUNTER — Encounter: Payer: Self-pay | Admitting: Oncology

## 2023-09-16 ENCOUNTER — Other Ambulatory Visit: Payer: Self-pay | Admitting: Family Medicine

## 2023-09-16 ENCOUNTER — Other Ambulatory Visit: Payer: Self-pay

## 2023-09-16 ENCOUNTER — Other Ambulatory Visit: Payer: Self-pay | Admitting: Emergency Medicine

## 2023-09-16 MED ORDER — APIXABAN 5 MG PO TABS
5.0000 mg | ORAL_TABLET | Freq: Two times a day (BID) | ORAL | 3 refills | Status: DC
  Filled 2023-09-16: qty 180, 90d supply, fill #0

## 2023-09-16 MED ORDER — ALBUTEROL SULFATE HFA 108 (90 BASE) MCG/ACT IN AERS
2.0000 | INHALATION_SPRAY | Freq: Four times a day (QID) | RESPIRATORY_TRACT | 6 refills | Status: DC | PRN
  Filled 2023-09-16: qty 6.7, 25d supply, fill #0

## 2023-09-16 MED ORDER — TAMSULOSIN HCL 0.4 MG PO CAPS
0.4000 mg | ORAL_CAPSULE | Freq: Every day | ORAL | 3 refills | Status: DC
Start: 2023-09-16 — End: 2023-10-01
  Filled 2023-09-16: qty 90, 90d supply, fill #0

## 2023-09-16 MED ORDER — ESCITALOPRAM OXALATE 10 MG PO TABS
10.0000 mg | ORAL_TABLET | Freq: Every day | ORAL | 3 refills | Status: DC
  Filled 2023-09-16: qty 90, 90d supply, fill #0

## 2023-09-16 MED ORDER — MAGNESIUM OXIDE -MG SUPPLEMENT 400 (240 MG) MG PO TABS
400.0000 mg | ORAL_TABLET | Freq: Every day | ORAL | 3 refills | Status: DC
Start: 2023-09-16 — End: 2023-10-01
  Filled 2023-09-16: qty 120, 120d supply, fill #0

## 2023-09-16 MED ORDER — FOLIC ACID 1 MG PO TABS
1.0000 mg | ORAL_TABLET | Freq: Every day | ORAL | 3 refills | Status: DC
  Filled 2023-09-16: qty 90, 90d supply, fill #0

## 2023-09-18 ENCOUNTER — Inpatient Hospital Stay (HOSPITAL_BASED_OUTPATIENT_CLINIC_OR_DEPARTMENT_OTHER): Admitting: Oncology

## 2023-09-18 VITALS — BP 118/66 | HR 55 | Temp 98.0°F | Resp 16

## 2023-09-18 DIAGNOSIS — I714 Abdominal aortic aneurysm, without rupture, unspecified: Secondary | ICD-10-CM

## 2023-09-18 DIAGNOSIS — D649 Anemia, unspecified: Secondary | ICD-10-CM | POA: Diagnosis not present

## 2023-09-18 DIAGNOSIS — C3411 Malignant neoplasm of upper lobe, right bronchus or lung: Secondary | ICD-10-CM

## 2023-09-18 DIAGNOSIS — Z08 Encounter for follow-up examination after completed treatment for malignant neoplasm: Secondary | ICD-10-CM | POA: Diagnosis not present

## 2023-09-18 NOTE — Assessment & Plan Note (Addendum)
 Hemoglobin continues to improve and is 11.3. Iron levels show iron sats of 17% (37%) and ferritin of 372. We discussed given decline in iron saturation would recommend a dose of IV iron. Patient was agreeable.

## 2023-09-18 NOTE — Assessment & Plan Note (Addendum)
 Reports previous repair.  Recent ct scan shows AAA measuring 4.1 cm.  He would like to be referred back to vascular- Dr. Harvey for evaluation.

## 2023-09-18 NOTE — Progress Notes (Signed)
 [facility] OFFICE PROGRESS NOTE  Cook, Jayce G, DO  ASSESSMENT & PLAN:  Assessment & Plan Primary adenocarcinoma of upper lobe of right lung [facility]) - He is currently at a rehab facility undergoing physical therapy.  He is able to walk with the help of walker. - Reviewed labs from 06/10/2023: Normal LFTs and creatinine.  CBC grossly normal. - CT chest without contrast on 06/03/2023: No evidence of recurrence.  There is a new pleural-based ovoid nodule in the lingular base at 1.6 x 0.9 cm, likely nodular atelectasis. F/U CT scan from 09/10/23 unchanged postoperative appearance, partial resolution of previously seen irregular nodule in lingula consistent with resolving infection/inflammation. Unchanged 0.4 cm nodule in anterior Upper Lobe. 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 CAD.  Overall stable.  - Recommend follow-up in 3 months with repeat CT chest and labs. Normocytic anemia  Hemoglobin continues to improve and is 11.3. Iron levels show iron sats of 17% (37%) and ferritin of 372. We discussed given decline in iron saturation would recommend a dose of IV iron. Patient was agreeable.    Aneurysm of abdominal vessel (HCC) Reports previous repair.  Recent ct scan shows AAA measuring 4.1 cm.  He would like to be referred back to vascular- Dr. Harvey for evaluation.   Orders Placed This Encounter  Procedures   CT CHEST WO CONTRAST    Standing Status:   Future    Expected Date:   12/19/2023    Expiration Date:   09/17/2024    Preferred imaging location?:   [facility]   Ferritin    Standing Status:   Future    Expected Date:   12/19/2023    Expiration Date:   09/17/2024   Iron and TIBC ([facility] ONLY)    Standing Status:   Future    Expected Date:   12/19/2023    Expiration Date:   09/17/2024   Comprehensive metabolic panel    Standing Status:   Future    Expected Date:   12/19/2023    Expiration Date:   09/17/2024   CBC with Differential    Standing Status:    Future    Expected Date:   12/19/2023    Expiration Date:   09/17/2024   Comprehensive metabolic panel    Standing Status:   Future    Expected Date:   12/19/2023    Expiration Date:   09/17/2024   CBC with Differential    Standing Status:   Future    Expected Date:   12/19/2023    Expiration Date:   09/17/2024   Magnesium     Standing Status:   Future    Expected Date:   12/19/2023    Expiration Date:   09/17/2024   Ambulatory referral to Vascular Surgery    Referral Priority:   Routine    Referral Type:   Surgical    Referral Reason:   Specialty Services Required    Referred to Provider:   Marea Selinda RAMAN, MD    Requested Specialty:   Vascular Surgery    Number of Visits Requested:   1    INTERVAL HISTORY: Patient returns for follow-up with his wife.   Patient reports he fell and broke his hip in April 2025.  He required rehab at [facility] with rehab X 4 months.  This is the second time he is falling and broken his hip. He supposed to return home tomorrow.   He had a CT without contrast yesterday for  history of right upper lobe lung cancer which showed unchanged postoperative appearance of chest status post upper lobectomy.  Partial resolution of the previously seen irregular nodule in the posterior lingula consistent with resolving infection or inflammation and an unchanged 0.4 cm nodule in the anterior left upper lobe nonspecific.  Previous AAA repair with Dr. Harvey.   We reviewed CBC, CMP, iron panel, ferritin magnesium  level and CT chest.  SUMMARY OF HEMATOLOGIC HISTORY: Oncology History  Primary adenocarcinoma of upper lobe of right lung (HCC)  06/26/2021 Initial Diagnosis   Primary adenocarcinoma of upper lobe of right lung (HCC)   08/06/2021 - 08/27/2021 Chemotherapy   Patient is on Treatment Plan : LUNG NSCLC Pemetrexed  + Carboplatin  q21d x 4 Cycles     08/06/2021 - 10/02/2021 Chemotherapy   Patient is on Treatment Plan : LUNG NSCLC Pemetrexed  + Carboplatin  q21d x 4  Cycles     03/11/2022 - 02/13/2023 Chemotherapy   Patient is on Treatment Plan : LUNG NSCLC Pembrolizumab  (200) q21d       1. 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  Occasional additional small pulmonary nodules measuring 0.4 cm and smaller. - Bronchoscopy (06/11/2021): RUL nodule brushing and FNA: Malignant cells with features of adenocarcinoma. - PET scan (06/22/2021): Subsolid nodular lesion in the right upper lobe, hypermetabolic SUV 15.  Small right hilar and infrahilar lymph nodes with SUV 4.03 concerning for metastatic adenopathy.  Small hypermetabolic left parotid gland lesion, consistent with previous history of Wharton's tumor.  Hypermetabolic focus in the transverse colon SUV 8.69. - MRI of the brain (07/11/2021): No evidence of metastatic disease.  Right sphenoid wing meningioma stable since 2022. - He was evaluated by Dr. Kerrin and was recommended to undergo neoadjuvant chemoimmunotherapy followed by surgical resection. - 3 cycles of carboplatin , pemetrexed  from 08/06/2021 through 10/02/2021 (opdivo  given with only cycle 2, discontinued for cycle 3 due to cardiac problems) - NGS testing not performed due to insufficient sample. - Guardant360: Negative for EGFR and ALK.  MSI high not detected. 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  Negative for visceral pleural involvement, LVI.  PD-L1 TPS is 1%. - Adjuvant pembrolizumab  started on 03/11/2022.  Pembrolizumab  held after 3 cycles due to fall and right fracture, s/p right hemiarthroplasty.    CBC    Component Value Date/Time   WBC 6.3 09/10/2023 1239   RBC 3.64 (L) 09/10/2023 1239   HGB 11.3 (L) 09/10/2023 1239   HGB 10.1 (L)  06/12/2022 1620   HCT 34.4 (L) 09/10/2023 1239   HCT 29.8 (L) 06/12/2022 1620   PLT 170 09/10/2023 1239   PLT 135 (L) 06/12/2022 1620   MCV 94.5 09/10/2023 1239   MCV 92 06/12/2022 1620   MCH 31.0 09/10/2023 1239   MCHC 32.8 09/10/2023 1239   RDW 14.3 09/10/2023 1239   RDW 13.7 06/12/2022 1620   LYMPHSABS 1.3 09/10/2023 1239   LYMPHSABS 2.1 07/13/2020 1006   MONOABS 0.6 09/10/2023 1239   EOSABS 0.3 09/10/2023 1239   EOSABS 0.1 07/13/2020 1006   BASOSABS 0.1 09/10/2023 1239   BASOSABS 0.1 07/13/2020 1006       Latest Ref Rng & Units 09/10/2023   12:39 PM 06/10/2023    2:06  PM 05/16/2023    4:26 AM  CMP  Glucose 70 - 99 mg/dL 77  859  876   BUN 8 - 23 mg/dL 26  21  22    Creatinine 0.61 - 1.24 mg/dL 8.66  8.82  8.86   Sodium 135 - 145 mmol/L 138  131  133   Potassium 3.5 - 5.1 mmol/L 3.6  3.3  4.1   Chloride 98 - 111 mmol/L 103  97  101   CO2 22 - 32 mmol/L 22  24  23    Calcium  8.9 - 10.3 mg/dL 9.3  9.1  8.5   Total Protein 6.5 - 8.1 g/dL 6.8  7.2  6.0   Total Bilirubin 0.0 - 1.2 mg/dL 0.5  0.6  0.7   Alkaline Phos 38 - 126 U/L 80  113  45   AST 15 - 41 U/L 20  26  18    ALT 0 - 44 U/L 14  15  9       Lab Results  Component Value Date   FERRITIN 148 09/10/2023   VITAMINB12 232 03/11/2023    Vitals:   09/18/23 1306  BP: 118/66  Pulse: (!) 55  Resp: 16  Temp: 98 F (36.7 C)  SpO2: 99%    Review of System:  Review of Systems  Constitutional:  Positive for malaise/fatigue.  Musculoskeletal:  Positive for joint pain.  Neurological:  Positive for weakness.    Physical Exam: Physical Exam Constitutional:      Appearance: Normal appearance.  HENT:     Head: Normocephalic and atraumatic.  Eyes:     Pupils: Pupils are equal, round, and reactive to light.  Cardiovascular:     Rate and Rhythm: Normal rate and regular rhythm.     Heart sounds: Normal heart sounds. No murmur heard. Pulmonary:     Effort: Pulmonary effort is normal.     Breath sounds: Normal  breath sounds. No wheezing.  Abdominal:     General: Bowel sounds are normal. There is no distension.     Palpations: Abdomen is soft.     Tenderness: There is no abdominal tenderness.  Musculoskeletal:        General: Normal range of motion.     Cervical back: Normal range of motion.  Skin:    General: Skin is warm and dry.     Findings: No rash.  Neurological:     Mental Status: He is alert and oriented to person, place, and time.     Gait: Gait is intact.  Psychiatric:        Mood and Affect: Mood and affect normal.        Cognition and Memory: Memory normal.        Judgment: Judgment normal.      I spent 25 minutes dedicated to the care of this patient (face-to-face and non-face-to-face) on the date of the encounter to include what is described in the assessment and plan.,  Delon Hope, NP 09/18/2023 7:45 PM

## 2023-09-18 NOTE — Assessment & Plan Note (Addendum)
-   He is currently at a rehab facility undergoing physical therapy.  He is able to walk with the help of walker. - Reviewed labs from 06/10/2023: Normal LFTs and creatinine.  CBC grossly normal. - CT chest without contrast on 06/03/2023: No evidence of recurrence.  There is a new pleural-based ovoid nodule in the lingular base at 1.6 x 0.9 cm, likely nodular atelectasis. F/U CT scan from 09/10/23 unchanged postoperative appearance, partial resolution of previously seen irregular nodule in lingula consistent with resolving infection/inflammation. Unchanged 0.4 cm nodule in anterior Upper Lobe. 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 CAD.  Overall stable.  - Recommend follow-up in 3 months with repeat CT chest and labs.

## 2023-09-23 ENCOUNTER — Inpatient Hospital Stay: Attending: Hematology

## 2023-09-23 VITALS — BP 121/65 | HR 65 | Temp 97.5°F | Resp 18

## 2023-09-23 DIAGNOSIS — Z79899 Other long term (current) drug therapy: Secondary | ICD-10-CM | POA: Insufficient documentation

## 2023-09-23 DIAGNOSIS — D509 Iron deficiency anemia, unspecified: Secondary | ICD-10-CM | POA: Diagnosis present

## 2023-09-23 MED ORDER — SODIUM CHLORIDE 0.9 % IV SOLN
Freq: Once | INTRAVENOUS | Status: AC
Start: 2023-09-23 — End: 2023-09-23

## 2023-09-23 MED ORDER — SODIUM CHLORIDE 0.9 % IV SOLN
510.0000 mg | Freq: Once | INTRAVENOUS | Status: AC
  Administered 2023-09-23: 510 mg via INTRAVENOUS
  Filled 2023-09-23: qty 510

## 2023-09-23 NOTE — Patient Instructions (Signed)

## 2023-09-23 NOTE — Progress Notes (Signed)
 Patient tolerated iron  infusion with no complaints voiced.  Peripheral IV site clean and dry with good blood return noted before and after infusion.  Pt observed for 30 minutes post iron  infusion without any complications. VSS with discharge and left in satisfactory condition with no s/s of distress noted. All follow ups as scheduled.  Darryl Ward

## 2023-09-24 ENCOUNTER — Ambulatory Visit: Admitting: Family Medicine

## 2023-09-24 VITALS — BP 104/63 | HR 62 | Ht 70.0 in | Wt 177.0 lb

## 2023-09-24 DIAGNOSIS — Z23 Encounter for immunization: Secondary | ICD-10-CM | POA: Diagnosis not present

## 2023-09-24 DIAGNOSIS — I5042 Chronic combined systolic (congestive) and diastolic (congestive) heart failure: Secondary | ICD-10-CM

## 2023-09-24 DIAGNOSIS — I10 Essential (primary) hypertension: Secondary | ICD-10-CM | POA: Diagnosis not present

## 2023-09-24 DIAGNOSIS — J449 Chronic obstructive pulmonary disease, unspecified: Secondary | ICD-10-CM | POA: Diagnosis not present

## 2023-09-24 DIAGNOSIS — Z8781 Personal history of (healed) traumatic fracture: Secondary | ICD-10-CM

## 2023-09-24 NOTE — Patient Instructions (Signed)
 Continue your medications.  Follow up in 6 months.  Take care  Dr. Adriana Simas

## 2023-09-25 ENCOUNTER — Other Ambulatory Visit (HOSPITAL_COMMUNITY): Payer: Self-pay

## 2023-09-25 DIAGNOSIS — Z8781 Personal history of (healed) traumatic fracture: Secondary | ICD-10-CM | POA: Insufficient documentation

## 2023-09-25 MED ORDER — CHLORTHALIDONE 25 MG PO TABS
25.0000 mg | ORAL_TABLET | Freq: Every day | ORAL | 3 refills | Status: DC
  Filled 2023-09-25: qty 90, 90d supply, fill #0

## 2023-09-25 MED ORDER — FUROSEMIDE 20 MG PO TABS
20.0000 mg | ORAL_TABLET | Freq: Every day | ORAL | 1 refills | Status: DC | PRN
  Filled 2023-09-25: qty 90, 90d supply, fill #0

## 2023-09-25 NOTE — Assessment & Plan Note (Signed)
 Rx given for Transport chair.

## 2023-09-25 NOTE — Assessment & Plan Note (Signed)
 Euvolemic today. Recent creatinine mildly elevated. Will continue with current dose of lasix .

## 2023-09-25 NOTE — Assessment & Plan Note (Signed)
 Stable.  Continue current medications.

## 2023-09-25 NOTE — Progress Notes (Signed)
 Subjective:  Patient ID: Darryl Ward, male    DOB: 05/25/1945  Age: 78 y.o. MRN: [id number]  CC:   Chief Complaint  Patient presents with   Hospitalization Follow-up    HPI:  78 year old male presents for follow up.  He is accompanied by his significant other.  Discharged from rehab facility on Friday. Overall is stable. Has recently seen Oncology. Anemia stable. Cancer stable.  Denies chest pain. SOB at baseline.  Significant other requesting transport chair as taking wheelchair in and out for appointments is very difficult.  BP stable.   Patient Active Problem List   Diagnosis Date Noted   History of hip fracture 09/25/2023   Voice hoarseness 06/23/2023   Malnutrition of moderate degree 05/15/2023   Urinary incontinence 03/31/2023   Recurrent falls 03/11/2023   Paroxysmal atrial fibrillation (HCC) 03/11/2023   GERD without esophagitis 03/11/2023   Depression 03/11/2023   Benign prostatic hyperplasia with lower urinary tract symptoms 06/13/2022   Normocytic anemia 04/28/2022   Iron deficiency anemia 04/22/2022   History of stroke 03/27/2022   Chronic combined systolic and diastolic CHF (congestive heart failure) (HCC) 03/27/2022   Essential hypertension 09/30/2021   Primary adenocarcinoma of upper lobe of right lung (HCC) 06/26/2021   COPD (chronic obstructive pulmonary disease) (HCC) 05/29/2021   Meningioma, cerebral (HCC) 02/05/2020   Chronic midline low back pain without sciatica 04/27/2019   Tobacco abuse 11/17/2018   PVD (peripheral vascular disease) (HCC) 05/21/2012   Mixed hyperlipidemia 12/13/2011   Coronary atherosclerosis of native coronary artery 12/13/2011    Social Hx   Social History   Socioeconomic History   Marital status: Divorced    Spouse name: Not on file   Number of children: 1   Years of education: 11   Highest education level: 11th grade  Occupational History    Employer: [employer]  Tobacco Use   Smoking status: Former     Current packs/day: 0.00    Average packs/day: 1 pack/day for 40.0 years (40.0 ttl pk-yrs)    Types: Cigarettes    Start date: 07/1981    Quit date: 07/2021    Years since quitting: 2.1   Smokeless tobacco: Never   Tobacco comments:    1 pack of cigarettes smoked daily. 07/17/21 ARJ, RN   Vaping Use   Vaping status: Never Used  Substance and Sexual Activity   Alcohol use: No    Comment: Prior history of regular alcohol use   Drug use: No   Sexual activity: Not Currently  Other Topics Concern   Not on file  Social History Narrative   Not on file   Social Drivers of Health   Financial Resource Strain: High Risk (08/30/2022)   Overall Financial Resource Strain (CARDIA)    Difficulty of Paying Living Expenses: Hard  Food Insecurity: No Food Insecurity (05/14/2023)   Hunger Vital Sign    Worried About Running Out of Food in the Last Year: Never true    Ran Out of Food in the Last Year: Never true  Transportation Needs: No Transportation Needs (05/14/2023)   PRAPARE - Administrator, Civil Service (Medical): No    Lack of Transportation (Non-Medical): No  Physical Activity: Insufficiently Active (08/30/2022)   Exercise Vital Sign    Days of Exercise per Week: 7 days    Minutes of Exercise per Session: 20 min  Stress: No Stress Concern Present (08/30/2022)   [employer] of Occupational Health - Occupational Stress Questionnaire  Feeling of Stress : Not at all  Social Connections: Moderately Isolated (05/14/2023)   Social Connection and Isolation Panel    Frequency of Communication with Friends and Family: Twice a week    Frequency of Social Gatherings with Friends and Family: Three times a week    Attends Religious Services: Never    Active Member of Clubs or Organizations: No    Attends Banker Meetings: Never    Marital Status: Married    Review of Systems Per HPI  Objective:  BP 104/63   Pulse 62   Ht 5' 10 (1.778 m)   Wt 177 lb (80.3  kg)   SpO2 100%   BMI 25.40 kg/m      09/24/2023   10:05 AM 09/23/2023    3:17 PM 09/23/2023    2:02 PM  BP/Weight  Systolic BP 104 121 114  Diastolic BP 63 65 69  Wt. (Lbs) 177    BMI 25.4 kg/m2      Physical Exam Vitals and nursing note reviewed.  Constitutional:      General: He is not in acute distress. HENT:     Head: Normocephalic and atraumatic.  Eyes:     Conjunctiva/sclera: Conjunctivae normal.  Cardiovascular:     Rate and Rhythm: Normal rate and regular rhythm.  Pulmonary:     Effort: Pulmonary effort is normal.     Breath sounds: No wheezing or rales.  Neurological:     Mental Status: He is alert. Mental status is at baseline.  Psychiatric:        Mood and Affect: Mood normal.        Behavior: Behavior normal.     Lab Results  Component Value Date   WBC 6.3 09/10/2023   HGB 11.3 (L) 09/10/2023   HCT 34.4 (L) 09/10/2023   PLT 170 09/10/2023   GLUCOSE 77 09/10/2023   CHOL 84 03/11/2023   TRIG 94 03/11/2023   HDL 28 (L) 03/11/2023   LDLCALC 37 03/11/2023   ALT 14 09/10/2023   AST 20 09/10/2023   NA 138 09/10/2023   K 3.6 09/10/2023   CL 103 09/10/2023   CREATININE 1.33 (H) 09/10/2023   BUN 26 (H) 09/10/2023   CO2 22 09/10/2023   TSH 2.997 04/03/2023   INR 1.3 (H) 04/27/2022   HGBA1C 5.5 03/10/2023     Assessment & Plan:  Essential hypertension Assessment & Plan: Stable. Continue current medications.   Need for influenza vaccination -     Flu vaccine HIGH DOSE PF(Fluzone Trivalent)  Chronic obstructive pulmonary disease, unspecified COPD type (HCC) Assessment & Plan: Stable.   Chronic combined systolic and diastolic CHF (congestive heart failure) (HCC) Assessment & Plan: Euvolemic today. Recent creatinine mildly elevated. Will continue with current dose of lasix .    History of hip fracture Assessment & Plan: Rx given for Transport chair.   Other orders -     Chlorthalidone ; Take 1 tablet (25 mg total) by mouth daily.  Dispense:  90 tablet; Refill: 3 -     Furosemide ; Take 1 tablet (20 mg total) by mouth daily as needed.  Dispense: 90 tablet; Refill: 1    Follow-up:  Return in about 6 months (around 03/23/2024).  Jacqulyn Ahle DO [facility]

## 2023-09-25 NOTE — Assessment & Plan Note (Signed)
 Stable

## 2023-09-26 ENCOUNTER — Other Ambulatory Visit (HOSPITAL_COMMUNITY): Payer: Self-pay

## 2023-09-26 ENCOUNTER — Other Ambulatory Visit: Payer: Self-pay | Admitting: Family Medicine

## 2023-09-26 ENCOUNTER — Other Ambulatory Visit: Payer: Self-pay

## 2023-09-30 ENCOUNTER — Inpatient Hospital Stay

## 2023-09-30 VITALS — BP 110/65 | HR 59 | Temp 97.4°F | Resp 18

## 2023-09-30 DIAGNOSIS — D509 Iron deficiency anemia, unspecified: Secondary | ICD-10-CM

## 2023-09-30 MED ORDER — SODIUM CHLORIDE 0.9 % IV SOLN: Freq: Once | INTRAVENOUS | Status: AC

## 2023-09-30 MED ORDER — SODIUM CHLORIDE 0.9 % IV SOLN
510.0000 mg | Freq: Once | INTRAVENOUS | Status: AC
  Administered 2023-09-30: 510 mg via INTRAVENOUS
  Filled 2023-09-30: qty 510

## 2023-09-30 NOTE — Progress Notes (Signed)
 Patient tolerated iron infusion with no complaints voiced.  Peripheral IV site clean and dry with good blood return noted before and after infusion.  Band aid applied.  VSS with discharge and left in satisfactory condition with no s/s of distress noted.

## 2023-09-30 NOTE — Patient Instructions (Signed)

## 2023-10-01 ENCOUNTER — Other Ambulatory Visit: Payer: Self-pay | Admitting: Family Medicine

## 2023-10-01 MED ORDER — METOPROLOL SUCCINATE ER 25 MG PO TB24: 25.0000 mg | ORAL_TABLET | Freq: Every day | ORAL | 3 refills | Status: DC

## 2023-10-01 MED ORDER — FOLIC ACID 1 MG PO TABS: 1.0000 mg | ORAL_TABLET | Freq: Every day | ORAL | 3 refills | Status: AC

## 2023-10-01 MED ORDER — MAGNESIUM OXIDE -MG SUPPLEMENT 400 (240 MG) MG PO TABS: 400.0000 mg | ORAL_TABLET | Freq: Every day | ORAL | 3 refills | Status: AC

## 2023-10-01 MED ORDER — ATORVASTATIN CALCIUM 80 MG PO TABS: 80.0000 mg | ORAL_TABLET | Freq: Every day | ORAL | 3 refills | Status: DC

## 2023-10-01 MED ORDER — FUROSEMIDE 20 MG PO TABS: 20.0000 mg | ORAL_TABLET | Freq: Every day | ORAL | 3 refills | Status: DC | PRN

## 2023-10-01 MED ORDER — PANTOPRAZOLE SODIUM 40 MG PO TBEC: 40.0000 mg | DELAYED_RELEASE_TABLET | Freq: Every day | ORAL | 3 refills | Status: DC

## 2023-10-01 MED ORDER — CHLORTHALIDONE 25 MG PO TABS: 25.0000 mg | ORAL_TABLET | Freq: Every day | ORAL | 3 refills | Status: DC

## 2023-10-01 MED ORDER — TAMSULOSIN HCL 0.4 MG PO CAPS: 0.4000 mg | ORAL_CAPSULE | Freq: Every day | ORAL | 3 refills | Status: DC

## 2023-10-01 MED ORDER — ESCITALOPRAM OXALATE 10 MG PO TABS: 10.0000 mg | ORAL_TABLET | Freq: Every day | ORAL | 3 refills | Status: DC

## 2023-10-01 MED ORDER — APIXABAN 5 MG PO TABS: 5.0000 mg | ORAL_TABLET | Freq: Two times a day (BID) | ORAL | 3 refills | Status: DC

## 2023-10-01 NOTE — Telephone Encounter (Signed)
 Verbal orders ok  Copied from CRM [id number]. Topic: Clinical - Home Health Verbal Orders >> Oct 01, 2023 12:55 PM Nathanel BROCKS wrote: Caller/Agency: Todd with [facility] Number: [phone] Service Requested: Occupational Therapy mobility and copd ed pain control health permotion and home program Frequency: evaluated 1 time a week 3 weeks 2 times a week for 1 week Any new concerns about the patient? No

## 2023-10-01 NOTE — Telephone Encounter (Signed)
 Copied from CRM [id number]. Topic: Clinical - Medication Refill >> Oct 01, 2023  4:10 PM Winona R wrote: Medication:   tamsulosin  (FLOMAX ) 0.4 MG CAPS capsule  apixaban  (ELIQUIS ) 5 MG TABS tablet atorvastatin  (LIPITOR ) 80 MG tablet pantoprazole  (PROTONIX ) 40 MG tablet magnesium  oxide (MAG-OX) 400 (240 Mg) MG tablet metoprolol  succinate (TOPROL -XL) 25 MG 24 hr tablet escitalopram  (LEXAPRO ) 10 MG tablet folic acid  (KP FOLIC ACID ) 1 MG tablet  furosemide  (LASIX ) 20 MG tablet chlorthalidone  (HYGROTON ) 25 MG tablet  Compliance package for pt.  Has the patient contacted their pharmacy? Yes, [facility] calling to request these medications to create a compliance package for pt  (Agent: If no, request that the patient contact the pharmacy for the refill. If patient does not wish to contact the pharmacy document the reason why and proceed with request.) (Agent: If yes, when and what did the pharmacy advise?)  This is the patient's preferred pharmacy:   [facility], [place] - [address] [place] [address] Phone: [phone] Fax: [phone]  Is this the correct pharmacy for this prescription? Yes If no, delete pharmacy and type the correct one.   Has the prescription been filled recently? Not sure   Is the patient out of the medication? Not sure   Has the patient been seen for an appointment in the last year OR does the patient have an upcoming appointment? Yes  Can we respond through MyChart? No  Agent: Please be advised that Rx refills may take up to 3 business days. We ask that you follow-up with your pharmacy.

## 2023-10-06 ENCOUNTER — Other Ambulatory Visit: Payer: Self-pay

## 2023-10-06 DIAGNOSIS — I714 Abdominal aortic aneurysm, without rupture, unspecified: Secondary | ICD-10-CM

## 2023-10-07 ENCOUNTER — Telehealth: Payer: Self-pay

## 2023-10-07 NOTE — Telephone Encounter (Signed)
 Copied from CRM [id number]. Topic: General - Other >> Oct 07, 2023 10:07 AM Shona RAMAN wrote: Reason for CRM: [facility] is calling to get the below medications transferred over to their pharmacy per patient request.  umeclidinium bromide  (INCRUSE ELLIPTA ) 62.5 MCG/ACT AEPB albuterol  (VENTOLIN  HFA) 108 (90 Base) MCG/ACT inhaler  [facility] - [address] [place] - [address] [place] [address] Phone: [phone] Fax: [phone] Hours: Not open 24 hours  ATC x1. No answer left detailed message asking pt to call back and verify if pt would like rx's sent to [facility] pharmacy.

## 2023-10-10 ENCOUNTER — Telehealth: Payer: Self-pay | Admitting: *Deleted

## 2023-10-10 ENCOUNTER — Other Ambulatory Visit (HOSPITAL_COMMUNITY): Payer: Self-pay

## 2023-10-10 DIAGNOSIS — J449 Chronic obstructive pulmonary disease, unspecified: Secondary | ICD-10-CM

## 2023-10-10 MED ORDER — INCRUSE ELLIPTA 62.5 MCG/ACT IN AEPB
1.0000 | INHALATION_SPRAY | Freq: Every day | RESPIRATORY_TRACT | 6 refills | Status: DC
Start: 2023-10-10 — End: 2023-12-17

## 2023-10-10 MED ORDER — ALBUTEROL SULFATE HFA 108 (90 BASE) MCG/ACT IN AERS: 2.0000 | INHALATION_SPRAY | Freq: Four times a day (QID) | RESPIRATORY_TRACT | 6 refills | Status: DC | PRN

## 2023-10-10 NOTE — Telephone Encounter (Signed)
 Spoke with pts POA [name] to verify change in pharmacy from [facility] to [facility]. Pt would like inhalers sent to [facility].  Called [facility] spoke with Manus to cancel any all refills.  Plan as of 06/23/23 Continue Albuterol  inhaler 2 puffs every 6 hours as needed for shortness of breath or wheezing. Notify if symptoms persist despite rescue inhaler/neb use.  Continue Incruse 1 puff daily or Spiriva  2 puffs daily. Do not use both. You can switch back to the Spiriva  once you go home  Try flonase  nasal spray 2 sprays each nostril daily for nasal drainage and see how this helps your voice hoarseness Continue pantoprazole  (protonix ) daily for reflux    Attend CT scan of the chest in August as scheduled  Follow up with oncology afterwards   Follow up in one year with Dr. Shelah or Izetta Malachy PIETY. If symptoms worsen, please contact office for sooner follow up or seek emergency care.

## 2023-10-13 NOTE — Telephone Encounter (Signed)
 Handled in 9/19 encounter. Nfn

## 2023-10-20 ENCOUNTER — Encounter: Payer: Self-pay | Admitting: Cardiology

## 2023-10-20 ENCOUNTER — Ambulatory Visit: Attending: Cardiology | Admitting: Cardiology

## 2023-10-20 VITALS — BP 110/64 | HR 61 | Ht 70.0 in | Wt 180.0 lb

## 2023-10-20 DIAGNOSIS — I480 Paroxysmal atrial fibrillation: Secondary | ICD-10-CM | POA: Diagnosis not present

## 2023-10-20 DIAGNOSIS — I10 Essential (primary) hypertension: Secondary | ICD-10-CM | POA: Diagnosis not present

## 2023-10-20 DIAGNOSIS — I25119 Atherosclerotic heart disease of native coronary artery with unspecified angina pectoris: Secondary | ICD-10-CM

## 2023-10-20 MED ORDER — CHLORTHALIDONE 25 MG PO TABS: 12.5000 mg | ORAL_TABLET | Freq: Every day | ORAL | 3 refills | Status: DC

## 2023-10-20 NOTE — Patient Instructions (Signed)
 Medication Instructions:  DECREASE Chlorthalidone  to 12.5 mg daily  Labwork: None today  Testing/Procedures: None today  Follow-Up: 6 months  Any Other Special Instructions Will Be Listed Below (If Applicable).  If you need a refill on your cardiac medications before your next appointment, please call your pharmacy.

## 2023-10-20 NOTE — Progress Notes (Signed)
    Cardiology Office Note  Date: 10/20/2023   ID: Darryl Ward, DOB 1945/11/27, MRN [id number]  History of Present Illness: Darryl Ward is a 78 y.o. male last seen in March by [name] PA-C, I reviewed her note.  He is here today with family member for a follow-up visit.  Recently got out of long-term rehabilitation following a fall with hip fracture.  Still doing some PT at home.  He does not report any angina or sense of palpitations.  Does have intermittent leg swelling as before and uses Lasix .  I reviewed his interval lab work which is noted below.  He continues to follow-up with Dr. Bluford.  We reviewed his medications and discussed reducing dose of chlorthalidone  for now.  Physical Exam: VS:  BP 110/64 (BP Location: Right Arm, Cuff Size: Normal)   Pulse 61   Ht 5' 10 (1.778 m)   Wt 180 lb (81.6 kg)   SpO2 97%   BMI 25.83 kg/m , BMI Body mass index is 25.83 kg/m.  Wt Readings from Last 3 Encounters:  10/20/23 180 lb (81.6 kg)  09/24/23 177 lb (80.3 kg)  06/23/23 163 lb (73.9 kg)    General: Patient appears comfortable at rest.  He is in a wheelchair today. HEENT: Conjunctiva and lids normal. Neck: Supple, no elevated JVP or carotid bruits. Lungs: Decreased breath sounds without wheezing. Cardiac: RRR with no significant murmur or gallop. Extremities: Mild ankle edema.  ECG:  An ECG dated 05/14/2023 was personally reviewed today and demonstrated:  Sinus rhythm with PACs.  Labwork: 04/03/2023: TSH 2.997 06/10/2023: Magnesium  2.0 09/10/2023: ALT 14; AST 20; BUN 26; Creatinine, Ser 1.33; Hemoglobin 11.3; Platelets 170; Potassium 3.6; Sodium 138     Component Value Date/Time   CHOL 84 03/11/2023 0425   CHOL 198 11/25/2019 1123   TRIG 94 03/11/2023 0425   HDL 28 (L) 03/11/2023 0425   HDL 36 (L) 11/25/2019 1123   CHOLHDL 3.0 03/11/2023 0425   VLDL 19 03/11/2023 0425   LDLCALC 37 03/11/2023 0425   LDLCALC 139 (H) 11/25/2019 1123   Other Studies Reviewed  Today:  No interval cardiac testing for review today.  Assessment and Plan:  1.  CAD status post angioplasty of the first diagonal in 2007.  LVEF 55% by echocardiogram in February 2024.  No active angina at current level of activity.  Continue Lipitor  80 mg daily.  2.  Paroxysmal atrial fibrillation.  CHA2DS2-VASc score is 6.  Continue Eliquis  5 mg twice daily for stroke prophylaxis, he does not report any spontaneous bleeding problems.  Also on Toprol -XL 25 mg daily.  Heart rate regular today.  3.  Primary hypertension.  Decrease chlorthalidone  to 12.5 mg daily in light of low normal blood pressure.  He does use Lasix  for intermittent leg swelling.  4.  Mixed hyperlipidemia.  LDL 37 in February.  Continue Lipitor  80 mg daily.  5.  Right upper lobe adenocarcinoma status post lobectomy and chemotherapy, continues to follow with Oncology.  Disposition:  Follow up 6 months.  Signed, Jayson JUDITHANN Sierras, M.D., F.A.C.C. [facility]

## 2023-10-29 ENCOUNTER — Encounter: Payer: Self-pay | Admitting: Oncology

## 2023-11-05 ENCOUNTER — Encounter: Payer: Self-pay | Admitting: Vascular Surgery

## 2023-11-05 ENCOUNTER — Ambulatory Visit (HOSPITAL_COMMUNITY)
Admission: RE | Admit: 2023-11-05 | Discharge: 2023-11-05 | Disposition: A | Discharge status: Home / Self Care | Source: Ambulatory Visit | Attending: Vascular Surgery | Admitting: Vascular Surgery

## 2023-11-05 ENCOUNTER — Ambulatory Visit (INDEPENDENT_AMBULATORY_CARE_PROVIDER_SITE_OTHER): Admitting: Vascular Surgery

## 2023-11-05 VITALS — BP 97/60 | HR 60 | Temp 98.1°F

## 2023-11-05 DIAGNOSIS — I714 Abdominal aortic aneurysm, without rupture, unspecified: Secondary | ICD-10-CM | POA: Diagnosis present

## 2023-11-05 NOTE — Progress Notes (Signed)
 Patient ID: Darryl Ward, male   DOB: Feb 23, 1945, 78 y.o.   MRN: [id number]  Reason for Consult: New Patient (Initial Visit)   Referred by Geofm Delon BRAVO, NP  Subjective:     HPI:  [name] is a 78 y.o. male with history of abdominal aortic aneurysm repair open with aortobifemoral bypass and reimplantation of the IMA.  He has not followed up here in several years.  More recently underwent CT scanning for separate issue which demonstrated 4 cm abdominal aortic aneurysm.  Patient states that he has had a difficult year recovering from recent diagnosis of testicular cancer and hip fracture.  He is in a wheelchair today without any new complaints.   Past Medical History:  Diagnosis Date   AAA (abdominal aortic aneurysm)    Arthritis    Broken hip (HCC)    Cancer (HCC)    Carotid artery disease    Nonobstructive   Cataract    COPD (chronic obstructive pulmonary disease) (HCC)    Coronary atherosclerosis of native coronary artery    PTCA small diagonal 2007 otherwise nonobstructive CAD   Depression    Dysrhythmia    Essential hypertension, benign    Hyperlipidemia    NSTEMI (non-ST elevated myocardial infarction) (HCC) 2007   Stroke [facility]) 2004   Stroke [facility]) 2025   TIA (transient ischemic attack) 2006   Family History  Problem Relation Age of Onset   Hyperlipidemia Sister    Heart attack Brother 23   Cancer - Colon Neg Hx    Past Surgical History:  Procedure Laterality Date   AORTA - BILATERAL FEMORAL ARTERY BYPASS GRAFT  01/08/2012   Procedure: AORTA BIFEMORAL BYPASS GRAFT;  Surgeon: Carlin BRAVO Haddock, MD;  Location: [facility] OR;  Service: Vascular;  Laterality: Bilateral;  using 18x56mm x 40cm Hemashield Gold Vascular Graft with Endarterectomy, Thombectomy and  Reimplantation of Inferior Mesenteric Artery   BACK SURGERY  2021   BRONCHIAL BIOPSY  06/11/2021   Procedure: BRONCHIAL BIOPSIES;  Surgeon: Shelah Lamar RAMAN, MD;  Location: [facility] ENDOSCOPY;  Service: Pulmonary;;    BRONCHIAL BRUSHINGS  06/11/2021   Procedure: BRONCHIAL BRUSHINGS;  Surgeon: Shelah Lamar RAMAN, MD;  Location: [facility] ENDOSCOPY;  Service: Pulmonary;;   BRONCHIAL NEEDLE ASPIRATION BIOPSY  06/11/2021   Procedure: BRONCHIAL NEEDLE ASPIRATION BIOPSIES;  Surgeon: Shelah Lamar RAMAN, MD;  Location: [facility] ENDOSCOPY;  Service: Pulmonary;;   CARDIOVERSION N/A 09/20/2021   Procedure: CARDIOVERSION;  Surgeon: Debera Jayson MATSU, MD;  Location: [facility] ORS;  Service: Cardiovascular;  Laterality: N/A;   COLONOSCOPY N/A 04/14/2019   Procedure: COLONOSCOPY;  Surgeon: Shaaron Lamar HERO, MD;  Location: [facility] ENDO SUITE;  Service: Endoscopy;  Laterality: N/A;  9:30   COLONOSCOPY WITH PROPOFOL  N/A 07/25/2021   Procedure: COLONOSCOPY WITH PROPOFOL ;  Surgeon: Cindie Carlin POUR, DO;  Location: [facility] ENDO SUITE;  Service: Endoscopy;  Laterality: N/A;  1:00pm   FIDUCIAL MARKER PLACEMENT  06/11/2021   Procedure: FIDUCIAL MARKER PLACEMENT;  Surgeon: Shelah Lamar RAMAN, MD;  Location: [facility] ENDOSCOPY;  Service: Pulmonary;;   HIP ARTHROPLASTY Right 04/28/2022   Procedure: ARTHROPLASTY BIPOLAR HIP (HEMIARTHROPLASTY);  Surgeon: Beverley Evalene BIRCH, MD;  [facility];  Service: Orthopedics;  Laterality: Right;   INTERCOSTAL NERVE BLOCK Right 11/12/2021   Procedure: INTERCOSTAL NERVE BLOCK;  Surgeon: Kerrin Elspeth BROCKS, MD;  Location: [facility] OR;  Service: Thoracic;  Laterality: Right;   IR IMAGING GUIDED PORT INSERTION  07/31/2021   Left cataract surgery     LYMPH NODE DISSECTION  Right 11/12/2021   Procedure: LYMPH NODE DISSECTION;  Surgeon: Kerrin Elspeth BROCKS, MD;  Location: [facility] OR;  Service: Thoracic;  Laterality: Right;   POLYPECTOMY  04/14/2019   Procedure: POLYPECTOMY;  Surgeon: Shaaron Lamar HERO, MD;  [facility];  Service: Endoscopy;;   POLYPECTOMY  07/25/2021   Procedure: POLYPECTOMY;  Surgeon: Cindie Carlin POUR, DO;  Location: [facility] ENDO [facility];  Service: Endoscopy;;   TEE WITHOUT CARDIOVERSION N/A 09/20/2021   Procedure: TRANSESOPHAGEAL  ECHOCARDIOGRAM (TEE);  Surgeon: Debera Jayson MATSU, MD;  Location: [facility] ORS;  Service: Cardiovascular;  Laterality: N/A;   TOTAL HIP ARTHROPLASTY Right 05/15/2023   Procedure: RIGHT HIP HEMIARTHROPLASTY REVISION WITH OPEN REDUCTION INTERNAL FIXATION OF PERIPROSTHETIC FEMUR FRACTURE;  Surgeon: Beverley Evalene BIRCH, MD;  Location: [facility] ORS;  Service: Orthopedics;  Laterality: Right;   TRANSFORAMINAL LUMBAR INTERBODY FUSION (TLIF) WITH PEDICLE SCREW FIXATION 1 LEVEL N/A 04/27/2020   Procedure: Transforaminal Lumbar Interbody Fusion Lumbar Five-Sacral One;  Surgeon: Debby Dorn MATSU, MD;  Location: [facility] OR;  Service: Neurosurgery;  Laterality: N/A;   VIDEO BRONCHOSCOPY WITH INSERTION OF INTERBRONCHIAL VALVE (IBV) N/A 11/22/2021   Procedure: VIDEO BRONCHOSCOPY WITH INSERTION OF INTERBRONCHIAL VALVE (IBV);  Surgeon: Kerrin Elspeth BROCKS, MD;  Location: [facility] OR;  Service: Thoracic;  Laterality: N/A;   VIDEO BRONCHOSCOPY WITH INSERTION OF INTERBRONCHIAL VALVE (IBV) N/A 01/24/2022   Procedure: VIDEO BRONCHOSCOPY WITH REMOVAL OF INTERBRONCHIAL VALVE (IBV);  Surgeon: Kerrin Elspeth BROCKS, MD;  Location: [facility] OR;  Service: Thoracic;  Laterality: N/A;   VIDEO BRONCHOSCOPY WITH RADIAL ENDOBRONCHIAL ULTRASOUND  06/11/2021   Procedure: VIDEO BRONCHOSCOPY WITH RADIAL ENDOBRONCHIAL ULTRASOUND;  Surgeon: Shelah Lamar RAMAN, MD;  Location: [facility] ENDOSCOPY;  Service: Pulmonary;;    Short Social History:  Social History   Tobacco Use   Smoking status: Former    Current packs/day: 0.00    Average packs/day: 1 pack/day for 40.0 years (40.0 ttl pk-yrs)    Types: Cigarettes    Start date: 07/1981    Quit date: 07/2021    Years since quitting: 2.2   Smokeless tobacco: Never   Tobacco comments:    1 pack of cigarettes smoked daily. 07/17/21 ARJ, RN   Substance Use Topics   Alcohol use: No    Comment: Prior history of regular alcohol use    No Known Allergies  Current Outpatient Medications  Medication Sig Dispense Refill    albuterol  (VENTOLIN  HFA) 108 (90 Base) MCG/ACT inhaler Inhale 2 puffs into the lungs every 6 (six) hours as needed for wheezing or shortness of breath. 6.7 g 6   apixaban  (ELIQUIS ) 5 MG TABS tablet Take 1 tablet (5 mg total) by mouth 2 (two) times daily. 180 tablet 3   atorvastatin  (LIPITOR ) 80 MG tablet Take 1 tablet (80 mg total) by mouth daily. 90 tablet 3   chlorthalidone  (HYGROTON ) 25 MG tablet Take 0.5 tablets (12.5 mg total) by mouth daily. 45 tablet 3   Cholecalciferol  50 MCG (2000 UT) CAPS Take 1 capsule by mouth daily.     docusate sodium  (COLACE) 100 MG capsule Take 1 capsule (100 mg total) by mouth 2 (two) times daily. 10 capsule 0   escitalopram  (LEXAPRO ) 10 MG tablet Take 1 tablet (10 mg total) by mouth daily. 90 tablet 3   fluticasone  (FLONASE ) 50 MCG/ACT nasal spray Place 2 sprays into both nostrils daily. 16 g 2   folic acid  (KP FOLIC ACID ) 1 MG tablet Take 1 tablet (1 mg total) by mouth daily. 90 tablet 3  furosemide  (LASIX ) 20 MG tablet Take 1 tablet (20 mg total) by mouth daily as needed. 90 tablet 3   HYDROcodone -acetaminophen  (NORCO/VICODIN) 5-325 MG tablet Take 1 tablet by mouth every 6 (six) hours.     magnesium  oxide (MAG-OX) 400 (240 Mg) MG tablet Take 1 tablet (400 mg total) by mouth daily. 120 tablet 3   methocarbamol  (ROBAXIN ) 500 MG tablet TAKE 1 TABLET BY MOUTH FOUR TIMES DAILY *NEW PRESCRIPTION REQUEST* 360 tablet 11   metoprolol  succinate (TOPROL -XL) 25 MG 24 hr tablet Take 1 tablet (25 mg total) by mouth daily. 90 tablet 3   pantoprazole  (PROTONIX ) 40 MG tablet Take 1 tablet (40 mg total) by mouth daily. 90 tablet 3   tamsulosin  (FLOMAX ) 0.4 MG CAPS capsule Take 1 capsule (0.4 mg total) by mouth daily. 90 capsule 3   umeclidinium bromide  (INCRUSE ELLIPTA ) 62.5 MCG/ACT AEPB Inhale 1 puff into the lungs daily.     umeclidinium bromide  (INCRUSE ELLIPTA ) 62.5 MCG/ACT AEPB Inhale 1 puff into the lungs daily. 30 each 6   meclizine  (ANTIVERT ) 25 MG tablet Take 1 tablet  (25 mg total) by mouth 3 (three) times daily as needed for dizziness. (Patient not taking: Reported on 11/05/2023) 30 tablet 0   No current facility-administered medications for this visit.    Review of Systems  Constitutional: Positive for fatigue.  HENT: HENT negative.  Eyes: Eyes negative.  Respiratory: Respiratory negative.  Cardiovascular: Cardiovascular negative.  GI: Gastrointestinal negative.  Musculoskeletal: Positive for leg pain.  Skin: Skin negative.  Neurological: Neurological negative. Hematologic: Hematologic/lymphatic negative.  Psychiatric: Psychiatric negative.        Objective:  Objective   Vitals:   11/05/23 1054  BP: 97/60  Pulse: 60  Temp: 98.1 F (36.7 C)  SpO2: 95%   There is no height or weight on file to calculate BMI.  Physical Exam HENT:     Head: Normocephalic.     Nose: Nose normal.     Mouth/Throat:     Mouth: Mucous membranes are moist.  Cardiovascular:     Rate and Rhythm: Normal rate.     Pulses:          Femoral pulses are 2+ on the right side and 2+ on the left side. Pulmonary:     Effort: Pulmonary effort is normal.  Musculoskeletal:        General: Normal range of motion.     Right lower leg: No edema.     Left lower leg: No edema.  Skin:    General: Skin is warm.     Capillary Refill: Capillary refill takes less than 2 seconds.  Neurological:     General: No focal deficit present.     Mental Status: He is alert.     Data: Abdominal Aorta Findings:  +-----------+-------+----------+----------+--------+--------+--------+  Location  AP (cm)Trans (cm)PSV (cm/s)WaveformThrombusComments  +-----------+-------+----------+----------+--------+--------+--------+  Proximal  3.90   3.90      32        biphasic        fusiform  +-----------+-------+----------+----------+--------+--------+--------+  Mid       3.00   2.60      24        biphasic        fusiform   +-----------+-------+----------+----------+--------+--------+--------+  Distal    2.60   2.70      67        biphasic                  +-----------+-------+----------+----------+--------+--------+--------+  RT CIA Prox1.2    1.2       45        biphasic                  +-----------+-------+----------+----------+--------+--------+--------+  RT EIA Prox1.2    1.2                                           +-----------+-------+----------+----------+--------+--------+--------+  LT CIA Prox1.1    1.1       62        biphasic                  +-----------+-------+----------+----------+--------+--------+--------+  LT EIA Prox1.2    1.2                                           +-----------+-------+----------+----------+--------+--------+--------+   Visualization of the [place] artery and Proximal Abdominal Aorta was  limited. Difficult imaging due to overlying bowel gas and flash artifact.   IVC/Iliac Findings:  +--------+------+--------+--------+   IVC   PatentThrombusComments  +--------+------+--------+--------+  IVC Proxpatent                  +--------+------+--------+--------+      Summary:  Abdominal Aorta: There is evidence of abnormal dilatation of the proximal  and mid Abdominal aorta. The largest aortic measurement is 3.9 cm at the  proximal segment.  IVC/Iliac: There is no evidence of thrombus involving the IVC.      Assessment/Plan:     78 year old male with 4 cm perivisceral aortic aneurysm recent identified on CT scan of the chest now confirmed with duplex.  Aorta above appears calcified however normal size.  He does have history of aortobifemoral bypass for repair of large infrarenal aneurysm.  Does not appear to have pseudoaneurysmal degeneration as such we will follow-up with duplex in 1 years time.  We discussed signs and symptoms of rupture they demonstrate good understanding short of this will follow-up in 1  year.     Penne Lonni Colorado MD [facility]

## 2023-11-19 ENCOUNTER — Telehealth: Payer: Self-pay

## 2023-11-19 NOTE — Telephone Encounter (Signed)
 Copied from CRM [id number]. Topic: Clinical - Home Health Verbal Orders >> Nov 18, 2023 12:43 PM Winona R wrote: Caller/Agency: PT Denise from [facility] Callback Number: [phone] Service Requested: Physical Therapy Frequency: 2x week for 2 weeks  1x for 2 weeks Any new concerns about the patient? No

## 2023-11-21 NOTE — Telephone Encounter (Signed)
 Called and left a message for Marinda with [facility] to give doctor's verbal orders 0k to give verbal orders.  Caller/Agency: PT Denise from [facility] Callback Number: [phone] Service Requested: Physical Therapy Frequency: 2x week for 2 weeks  1x for 2 weeks Any new concerns about the patient? No

## 2023-11-27 ENCOUNTER — Other Ambulatory Visit: Payer: Self-pay | Admitting: Medical Genetics

## 2023-11-28 ENCOUNTER — Ambulatory Visit

## 2023-11-28 VITALS — Ht 70.0 in | Wt 180.0 lb

## 2023-11-28 DIAGNOSIS — Z0000 Encounter for general adult medical examination without abnormal findings: Secondary | ICD-10-CM | POA: Diagnosis not present

## 2023-11-28 NOTE — Patient Instructions (Signed)
 Darryl Ward,  Thank you for taking the time for your Medicare Wellness Visit. I appreciate your continued commitment to your health goals. Please review the care plan we discussed, and feel free to reach out if I can assist you further.  Please note that Annual Wellness Visits do not include a physical exam. Some assessments may be limited, especially if the visit was conducted virtually. If needed, we may recommend an in-person follow-up with your provider.  Ongoing Care Seeing your primary care provider every 3 to 6 months helps us  monitor your health and provide consistent, personalized care.   Referrals If a referral was made during today's visit and you haven't received any updates within two weeks, please contact the referred provider directly to check on the status.  Recommended Screenings:  Health Maintenance  Topic Date Due   DTaP/Tdap/Td vaccine (1 - Tdap) 04/24/2024*   COVID-19 Vaccine (8 - Moderna risk 2025-26 season) 04/21/2024   Medicare Annual Wellness Visit  11/27/2024   Pneumococcal Vaccine for age over 52  Completed   Flu Shot  Completed   Zoster (Shingles) Vaccine  Completed   Meningitis B Vaccine  Aged Out   Colon Cancer Screening  Discontinued   Hepatitis C Screening  Discontinued  *Topic was postponed. The date shown is not the original due date.       11/28/2023    2:31 PM  Advanced Directives  Does Patient Have a Medical Advance Directive? Yes  Type of Advance Directive Healthcare Power of Attorney  Does patient want to make changes to medical advance directive? No - Patient declined  Copy of Healthcare Power of Attorney in Chart? Yes - validated most recent copy scanned in chart (See row information)    Vision: Annual vision screenings are recommended for early detection of glaucoma, cataracts, and diabetic retinopathy. These exams can also reveal signs of chronic conditions such as diabetes and high blood pressure.  Dental: Annual dental screenings help  detect early signs of oral cancer, gum disease, and other conditions linked to overall health, including heart disease and diabetes.  Please see the attached documents for additional preventive care recommendations.

## 2023-11-28 NOTE — Progress Notes (Signed)
 Subjective:   Darryl Ward is a 78 y.o. male who presents for a Medicare Annual Wellness Visit.  I connected with  NEITHAN DAY on 11/28/23 by a audio enabled telemedicine application and verified that I am speaking with the correct person using two identifiers.  Patient Location: Home  Provider Location: Office/Clinic  Persons Participating in Visit: Patient assisted by wife.  I discussed the limitations of evaluation and management by telemedicine. The patient expressed understanding and agreed to proceed.  Vital Signs: Because this visit was a virtual/telehealth visit, some criteria may be missing or patient reported. Any vitals not documented were not able to be obtained and vitals that have been documented are patient reported.  Allergies (verified) Patient has no known allergies.   History: Past Medical History:  Diagnosis Date   AAA (abdominal aortic aneurysm)    Anemia    Arthritis    Blood transfusion without reported diagnosis 10/2021   Broken hip (HCC)    Cancer (HCC)    Carotid artery disease    Nonobstructive   Cataract    CHF (congestive heart failure) (HCC)    COPD (chronic obstructive pulmonary disease) (HCC)    Coronary atherosclerosis of native coronary artery    PTCA small diagonal 2007 otherwise nonobstructive CAD   Depression    Dysrhythmia    Essential hypertension, benign    GERD (gastroesophageal reflux disease)    Hyperlipidemia    NSTEMI (non-ST elevated myocardial infarction) (HCC) 2007   Stroke [facility]) 2004   Stroke [facility]) 2025   TIA (transient ischemic attack) 2006   Past Surgical History:  Procedure Laterality Date   AORTA - BILATERAL FEMORAL ARTERY BYPASS GRAFT  01/08/2012   Procedure: AORTA BIFEMORAL BYPASS GRAFT;  Surgeon: Carlin FORBES Haddock, MD;  Location: [facility] OR;  Service: Vascular;  Laterality: Bilateral;  using 18x49mm x 40cm Hemashield Gold Vascular Graft with Endarterectomy, Thombectomy and  Reimplantation of Inferior Mesenteric  Artery   BACK SURGERY  2021   BRONCHIAL BIOPSY  06/11/2021   Procedure: BRONCHIAL BIOPSIES;  Surgeon: Shelah Lamar RAMAN, MD;  Location: [facility] ENDOSCOPY;  Service: Pulmonary;;   BRONCHIAL BRUSHINGS  06/11/2021   Procedure: BRONCHIAL BRUSHINGS;  Surgeon: Shelah Lamar RAMAN, MD;  Location: [facility] ENDOSCOPY;  Service: Pulmonary;;   BRONCHIAL NEEDLE ASPIRATION BIOPSY  06/11/2021   Procedure: BRONCHIAL NEEDLE ASPIRATION BIOPSIES;  Surgeon: Shelah Lamar RAMAN, MD;  Location: [facility] ENDOSCOPY;  Service: Pulmonary;;   CARDIOVERSION N/A 09/20/2021   Procedure: CARDIOVERSION;  Surgeon: Debera Jayson MATSU, MD;  Location: [facility] ORS;  Service: Cardiovascular;  Laterality: N/A;   COLONOSCOPY N/A 04/14/2019   Procedure: COLONOSCOPY;  Surgeon: Shaaron Lamar HERO, MD;  Location: [facility] ENDO SUITE;  Service: Endoscopy;  Laterality: N/A;  9:30   COLONOSCOPY WITH PROPOFOL  N/A 07/25/2021   Procedure: COLONOSCOPY WITH PROPOFOL ;  Surgeon: Cindie Carlin POUR, DO;  Location: [facility] ENDO SUITE;  Service: Endoscopy;  Laterality: N/A;  1:00pm   EYE SURGERY     FIDUCIAL MARKER PLACEMENT  06/11/2021   Procedure: FIDUCIAL MARKER PLACEMENT;  Surgeon: Shelah Lamar RAMAN, MD;  Location: [facility] ENDOSCOPY;  Service: Pulmonary;;   FRACTURE SURGERY     HIP ARTHROPLASTY Right 04/28/2022   Procedure: ARTHROPLASTY BIPOLAR HIP (HEMIARTHROPLASTY);  Surgeon: Beverley Evalene BIRCH, MD;  Location: [facility] OR;  Service: Orthopedics;  Laterality: Right;   INTERCOSTAL NERVE BLOCK Right 11/12/2021   Procedure: INTERCOSTAL NERVE BLOCK;  Surgeon: Kerrin Elspeth BROCKS, MD;  Location: [facility] OR;  Service: Thoracic;  Laterality: Right;  IR IMAGING GUIDED PORT INSERTION  07/31/2021   JOINT REPLACEMENT     Left cataract surgery     LYMPH NODE DISSECTION Right 11/12/2021   Procedure: LYMPH NODE DISSECTION;  Surgeon: Kerrin Elspeth BROCKS, MD;  Location: [facility] OR;  Service: Thoracic;  Laterality: Right;   POLYPECTOMY  04/14/2019   Procedure: POLYPECTOMY;  Surgeon: Shaaron Lamar HERO, MD;  Location: [facility]  ENDO SUITE;  Service: Endoscopy;;   POLYPECTOMY  07/25/2021   Procedure: POLYPECTOMY;  Surgeon: Cindie Carlin POUR, DO;  [facility] ENDO SUITE;  Service: Endoscopy;;   SPINE SURGERY     TEE WITHOUT CARDIOVERSION N/A 09/20/2021   Procedure: TRANSESOPHAGEAL ECHOCARDIOGRAM (TEE);  Surgeon: Debera Jayson MATSU, MD;  Location: [facility] ORS;  Service: Cardiovascular;  Laterality: N/A;   TOTAL HIP ARTHROPLASTY Right 05/15/2023   Procedure: RIGHT HIP HEMIARTHROPLASTY REVISION WITH OPEN REDUCTION INTERNAL FIXATION OF PERIPROSTHETIC FEMUR FRACTURE;  Surgeon: Beverley Evalene BIRCH, MD;  Location: [facility] ORS;  Service: Orthopedics;  Laterality: Right;   TRANSFORAMINAL LUMBAR INTERBODY FUSION (TLIF) WITH PEDICLE SCREW FIXATION 1 LEVEL N/A 04/27/2020   Procedure: Transforaminal Lumbar Interbody Fusion Lumbar Five-Sacral One;  Surgeon: Debby Dorn MATSU, MD;  Location: [facility] OR;  Service: Neurosurgery;  Laterality: N/A;   VIDEO BRONCHOSCOPY WITH INSERTION OF INTERBRONCHIAL VALVE (IBV) N/A 11/22/2021   Procedure: VIDEO BRONCHOSCOPY WITH INSERTION OF INTERBRONCHIAL VALVE (IBV);  Surgeon: Kerrin Elspeth BROCKS, MD;  Location: [facility] OR;  Service: Thoracic;  Laterality: N/A;   VIDEO BRONCHOSCOPY WITH INSERTION OF INTERBRONCHIAL VALVE (IBV) N/A 01/24/2022   Procedure: VIDEO BRONCHOSCOPY WITH REMOVAL OF INTERBRONCHIAL VALVE (IBV);  Surgeon: Kerrin Elspeth BROCKS, MD;  Location: [facility] OR;  Service: Thoracic;  Laterality: N/A;   VIDEO BRONCHOSCOPY WITH RADIAL ENDOBRONCHIAL ULTRASOUND  06/11/2021   Procedure: VIDEO BRONCHOSCOPY WITH RADIAL ENDOBRONCHIAL ULTRASOUND;  Surgeon: Shelah Lamar RAMAN, MD;  Location: [facility] ENDOSCOPY;  Service: Pulmonary;;   Family History  Problem Relation Age of Onset   Hearing loss Mother    Miscarriages / Stillbirths Mother    Vision loss Mother    Hyperlipidemia Sister    Hypertension Sister    Stroke Sister    Heart attack Brother 68   Cancer Brother    Hearing loss Brother    Heart disease Brother     Hypertension Brother    Hearing loss Brother    Heart disease Brother    Hypertension Brother    Cancer - Colon Neg Hx    Social History   Occupational History    Employer: [employer]  Tobacco Use   Smoking status: Former    Current packs/day: 0.00    Average packs/day: 1 pack/day for 40.0 years (40.0 ttl pk-yrs)    Types: Cigarettes    Start date: 07/1981    Quit date: 07/2021    Years since quitting: 2.3   Smokeless tobacco: Never   Tobacco comments:    1 pack of cigarettes smoked daily. 07/17/21 ARJ, RN   Vaping Use   Vaping status: Never Used  Substance and Sexual Activity   Alcohol use: No    Comment: Prior history of regular alcohol use   Drug use: No   Sexual activity: Not Currently   Tobacco Counseling Counseling given: Not Answered Tobacco comments: 1 pack of cigarettes smoked daily. 07/17/21 ARJ, RN   SDOH Screenings   Food Insecurity: No Food Insecurity (11/28/2023)  Housing: Low Risk  (11/28/2023)  Transportation Needs: No Transportation Needs (11/28/2023)  Utilities: Not At Risk (11/28/2023)  Alcohol Screen: Low  Risk  (08/30/2022)  Depression (PHQ2-9): Medium Risk (11/28/2023)  Financial Resource Strain: High Risk (08/30/2022)  Physical Activity: Sufficiently Active (11/28/2023)  Social Connections: Moderately Isolated (11/28/2023)  Stress: No Stress Concern Present (11/28/2023)  Tobacco Use: Medium Risk (11/28/2023)  Health Literacy: Adequate Health Literacy (11/28/2023)   Depression Screen    11/28/2023    2:36 PM 09/24/2023   10:09 AM 09/18/2023    1:11 PM 09/10/2023   12:39 PM 04/25/2023    8:41 AM 09/25/2022    2:22 PM 08/30/2022    2:32 PM  PHQ 2/9 Scores  PHQ - 2 Score 3 4 0 0 5 1 0  PHQ- 9 Score 6 7    12  1        Data saved with a previous flowsheet row definition      Goals Addressed             This Visit's Progress    Prevent falls         Visit info / Clinical Intake: Medicare Wellness Visit Type:: Subsequent Annual Wellness  Visit Medicare Wellness Visit Mode:: Telephone If telephone:: video declined If telephone or video:: vitals recorded from last visit Interpreter Needed?: No Pre-visit prep was completed: yes AWV questionnaire completed by patient prior to visit?: no Living arrangements:: lives with spouse/significant other Patient's Overall Health Status Rating: (!) fair Typical amount of pain: some Does pain affect daily life?: no Are you currently prescribed opioids?: no  Dietary Habits and Nutritional Risks How many meals a day?: 2 Eats fruit and vegetables daily?: yes Most meals are obtained by: having others provide food Diabetic:: no  Functional Status Activities of Daily Living (to include ambulation/medication): Independent Ambulation: Independent Home Assistive Devices/Equipment: Wheelchair Medication Administration: Independent Home Management: Independent Manage your own finances?: yes Primary transportation is: driving Concerns about vision?: no *vision screening is required for WTM* Concerns about hearing?: (!) yes Uses hearing aids?: no  Fall Screening Falls in the past year?: 0 Number of falls in past year: 0 Was there an injury with Fall?: 0 Fall Risk Category Calculator: 0 Patient Fall Risk Level: Low Fall Risk  Fall Risk Patient at Risk for Falls Due to: Impaired mobility Fall risk Follow up: Falls evaluation completed; Education provided; Falls prevention discussed  Home and Transportation Safety: All rugs have non-skid backing?: yes All stairs or steps have railings?: yes Grab bars in the bathtub or shower?: yes Have non-skid surface in bathtub or shower?: yes Good home lighting?: yes Regular seat belt use?: yes Hospital stays in the last year:: (!) yes How many hospital stays:: 2 Reason: Hip fracture and CVA  Cognitive Assessment Difficulty concentrating, remembering, or making decisions? : no Will 6CIT or Mini Cog be Completed: no 6CIT or Mini Cog  Declined: patient declined  Advance Directives (For Healthcare) Does Patient Have a Medical Advance Directive?: Yes Does patient want to make changes to medical advance directive?: No - Patient declined Type of Advance Directive: Healthcare Power of Attorney Copy of Healthcare Power of Attorney in Chart?: Yes - validated most recent copy scanned in chart (See row information) Copy of Living Will in Chart?: No - copy requested Would patient like information on creating a medical advance directive?: No - Patient declined  Reviewed/Updated  Reviewed/Updated: All        Objective:    Today's Vitals   11/28/23 1357  Weight: 180 lb (81.6 kg)  Height: 5' 10 (1.778 m)   Body mass index is 25.83 kg/m.  Current Medications (verified) Outpatient Encounter Medications as of 11/28/2023  Medication Sig   albuterol  (VENTOLIN  HFA) 108 (90 Base) MCG/ACT inhaler Inhale 2 puffs into the lungs every 6 (six) hours as needed for wheezing or shortness of breath.   apixaban  (ELIQUIS ) 5 MG TABS tablet Take 1 tablet (5 mg total) by mouth 2 (two) times daily.   atorvastatin  (LIPITOR ) 80 MG tablet Take 1 tablet (80 mg total) by mouth daily.   chlorthalidone  (HYGROTON ) 25 MG tablet Take 0.5 tablets (12.5 mg total) by mouth daily.   Cholecalciferol  50 MCG (2000 UT) CAPS Take 1 capsule by mouth daily.   docusate sodium  (COLACE) 100 MG capsule Take 1 capsule (100 mg total) by mouth 2 (two) times daily.   escitalopram  (LEXAPRO ) 10 MG tablet Take 1 tablet (10 mg total) by mouth daily.   fluticasone  (FLONASE ) 50 MCG/ACT nasal spray Place 2 sprays into both nostrils daily.   folic acid  (KP FOLIC ACID ) 1 MG tablet Take 1 tablet (1 mg total) by mouth daily.   furosemide  (LASIX ) 20 MG tablet Take 1 tablet (20 mg total) by mouth daily as needed.   HYDROcodone -acetaminophen  (NORCO/VICODIN) 5-325 MG tablet Take 1 tablet by mouth every 6 (six) hours.   magnesium  oxide (MAG-OX) 400 (240 Mg) MG tablet Take 1 tablet (400  mg total) by mouth daily.   methocarbamol  (ROBAXIN ) 500 MG tablet TAKE 1 TABLET BY MOUTH FOUR TIMES DAILY *NEW PRESCRIPTION REQUEST*   metoprolol  succinate (TOPROL -XL) 25 MG 24 hr tablet Take 1 tablet (25 mg total) by mouth daily.   pantoprazole  (PROTONIX ) 40 MG tablet Take 1 tablet (40 mg total) by mouth daily.   tamsulosin  (FLOMAX ) 0.4 MG CAPS capsule Take 1 capsule (0.4 mg total) by mouth daily.   umeclidinium bromide  (INCRUSE ELLIPTA ) 62.5 MCG/ACT AEPB Inhale 1 puff into the lungs daily.   umeclidinium bromide  (INCRUSE ELLIPTA ) 62.5 MCG/ACT AEPB Inhale 1 puff into the lungs daily.   meclizine  (ANTIVERT ) 25 MG tablet Take 1 tablet (25 mg total) by mouth 3 (three) times daily as needed for dizziness. (Patient not taking: Reported on 11/28/2023)   No facility-administered encounter medications on file as of 11/28/2023.   Hearing/Vision screen Hearing Screening - Comments:: Hearing impairment; wears hearing aids  Vision Screening - Comments:: Will schedule routine eye exam   Immunizations and Health Maintenance Health Maintenance  Topic Date Due   DTaP/Tdap/Td (1 - Tdap) 04/24/2024 (Originally 01/22/1964)   COVID-19 Vaccine (8 - Moderna risk 2025-26 season) 04/21/2024   Medicare Annual Wellness (AWV)  11/27/2024   Pneumococcal Vaccine: 50+ Years  Completed   Influenza Vaccine  Completed   Zoster Vaccines- Shingrix  Completed   Meningococcal B Vaccine  Aged Out   Colonoscopy  Discontinued   Hepatitis C Screening  Discontinued        Assessment/Plan:  This is a routine wellness examination for Darryl Ward.  Patient Care Team: Cook, Jayce G, DO as PCP - General (Family Medicine) Debera Jayson MATSU, MD as PCP - Cardiology (Cardiology) Shaaron Lamar HERO, MD as Consulting Physician (Gastroenterology) Celestia Joesph SQUIBB, RN as Oncology Nurse Navigator (Medical Oncology) Sheree Penne Bruckner, MD as Consulting Physician (Vascular Surgery) Geofm Delon BRAVO, NP as Nurse Practitioner (Nurse  Practitioner) Malachy Comer GAILS, NP as Nurse Practitioner (Nurse Practitioner)  I have personally reviewed and noted the following in the patient's chart:   Medical and social history Use of alcohol, tobacco or illicit drugs  Current medications and supplements including opioid prescriptions. Functional ability and status  Nutritional status Physical activity Advanced directives List of other physicians Hospitalizations, surgeries, and ER visits in previous 12 months Vitals Screenings to include cognitive, depression, and falls Referrals and appointments  No orders of the defined types were placed in this encounter.  In addition, I have reviewed and discussed with patient certain preventive protocols, quality metrics, and best practice recommendations. A written personalized care plan for preventive services as well as general preventive health recommendations were provided to patient.   Lavelle Charmaine Browner, LPN   [id number]   Return in 1 year (on 11/27/2024).  After Visit Summary: (MyChart) Due to this being a telephonic visit, the after visit summary with patients personalized plan was offered to patient via MyChart   Nurse Notes: No concerns

## 2023-12-07 ENCOUNTER — Encounter (HOSPITAL_BASED_OUTPATIENT_CLINIC_OR_DEPARTMENT_OTHER): Payer: Self-pay

## 2023-12-07 ENCOUNTER — Other Ambulatory Visit: Payer: Self-pay

## 2023-12-07 ENCOUNTER — Emergency Department (HOSPITAL_BASED_OUTPATIENT_CLINIC_OR_DEPARTMENT_OTHER)

## 2023-12-07 ENCOUNTER — Inpatient Hospital Stay (HOSPITAL_BASED_OUTPATIENT_CLINIC_OR_DEPARTMENT_OTHER)
Admission: EM | Admit: 2023-12-07 | Discharge: 2023-12-13 | DRG: 309 | Disposition: A | Discharge status: Home Health | Attending: Internal Medicine | Admitting: Internal Medicine

## 2023-12-07 DIAGNOSIS — I10 Essential (primary) hypertension: Secondary | ICD-10-CM | POA: Diagnosis present

## 2023-12-07 DIAGNOSIS — E782 Mixed hyperlipidemia: Secondary | ICD-10-CM | POA: Diagnosis present

## 2023-12-07 DIAGNOSIS — K529 Noninfective gastroenteritis and colitis, unspecified: Principal | ICD-10-CM

## 2023-12-07 DIAGNOSIS — J449 Chronic obstructive pulmonary disease, unspecified: Secondary | ICD-10-CM | POA: Diagnosis present

## 2023-12-07 DIAGNOSIS — K219 Gastro-esophageal reflux disease without esophagitis: Secondary | ICD-10-CM | POA: Diagnosis present

## 2023-12-07 DIAGNOSIS — I2510 Atherosclerotic heart disease of native coronary artery without angina pectoris: Secondary | ICD-10-CM | POA: Diagnosis present

## 2023-12-07 DIAGNOSIS — I4891 Unspecified atrial fibrillation: Secondary | ICD-10-CM | POA: Diagnosis not present

## 2023-12-07 DIAGNOSIS — I739 Peripheral vascular disease, unspecified: Secondary | ICD-10-CM | POA: Diagnosis present

## 2023-12-07 DIAGNOSIS — C3411 Malignant neoplasm of upper lobe, right bronchus or lung: Secondary | ICD-10-CM | POA: Diagnosis present

## 2023-12-07 DIAGNOSIS — A419 Sepsis, unspecified organism: Secondary | ICD-10-CM

## 2023-12-07 DIAGNOSIS — E876 Hypokalemia: Secondary | ICD-10-CM

## 2023-12-07 DIAGNOSIS — G4733 Obstructive sleep apnea (adult) (pediatric): Secondary | ICD-10-CM

## 2023-12-07 LAB — COMPREHENSIVE METABOLIC PANEL WITH GFR
ALT: 9 U/L (ref 0–44)
AST: 24 U/L (ref 15–41)
Albumin: 4 g/dL (ref 3.5–5.0)
Alkaline Phosphatase: 78 U/L (ref 38–126)
Anion gap: 16 — ABNORMAL HIGH (ref 5–15)
BUN: 31 mg/dL — ABNORMAL HIGH (ref 8–23)
CO2: 25 mmol/L (ref 22–32)
Calcium: 10.2 mg/dL (ref 8.9–10.3)
Chloride: 98 mmol/L (ref 98–111)
Creatinine, Ser: 1.5 mg/dL — ABNORMAL HIGH (ref 0.61–1.24)
GFR, Estimated: 48 mL/min — ABNORMAL LOW (ref >60)
Glucose, Bld: 120 mg/dL — ABNORMAL HIGH (ref 70–99)
Potassium: 3.4 mmol/L — ABNORMAL LOW (ref 3.5–5.1)
Sodium: 139 mmol/L (ref 135–145)
Total Bilirubin: 1 mg/dL (ref 0.0–1.2)
Total Protein: 7.4 g/dL (ref 6.5–8.1)

## 2023-12-07 LAB — URINALYSIS, ROUTINE W REFLEX MICROSCOPIC
Bacteria, UA: NONE SEEN
Bilirubin Urine: NEGATIVE
Glucose, UA: NEGATIVE mg/dL
Ketones, ur: NEGATIVE mg/dL
Leukocytes,Ua: NEGATIVE
Nitrite: NEGATIVE
Protein, ur: NEGATIVE mg/dL
Specific Gravity, Urine: 1.021 (ref 1.005–1.030)
pH: 6 (ref 5.0–8.0)

## 2023-12-07 LAB — LACTIC ACID, PLASMA
Lactic Acid, Venous: 2.1 mmol/L (ref 0.5–1.9)
Lactic Acid, Venous: 2.8 mmol/L (ref 0.5–1.9)
Lactic Acid, Venous: 1.7 mmol/L (ref 0.5–1.9)
Lactic Acid, Venous: 2.3 mmol/L (ref 0.5–1.9)

## 2023-12-07 LAB — PRO BRAIN NATRIURETIC PEPTIDE: Pro Brain Natriuretic Peptide: 617 pg/mL — ABNORMAL HIGH (ref <300.0)

## 2023-12-07 LAB — CBC
HCT: 40 % (ref 39.0–52.0)
Hemoglobin: 13.9 g/dL (ref 13.0–17.0)
MCH: 32.3 pg (ref 26.0–34.0)
MCHC: 34.8 g/dL (ref 30.0–36.0)
MCV: 92.8 fL (ref 80.0–100.0)
Platelets: 173 K/uL (ref 150–400)
RBC: 4.31 MIL/uL (ref 4.22–5.81)
RDW: 14.1 % (ref 11.5–15.5)
WBC: 12.9 K/uL — ABNORMAL HIGH (ref 4.0–10.5)
nRBC: 0 % (ref 0.0–0.2)

## 2023-12-07 LAB — RESP PANEL BY RT-PCR (RSV, FLU A&B, COVID)  RVPGX2
Influenza A by PCR: NEGATIVE
Influenza B by PCR: NEGATIVE
Resp Syncytial Virus by PCR: NEGATIVE
SARS Coronavirus 2 by RT PCR: NEGATIVE

## 2023-12-07 LAB — LIPASE, BLOOD: Lipase: 12 U/L (ref 11–51)

## 2023-12-07 LAB — TROPONIN T, HIGH SENSITIVITY
Troponin T High Sensitivity: 44 ng/L — ABNORMAL HIGH (ref 0–19)
Troponin T High Sensitivity: 51 ng/L — ABNORMAL HIGH (ref 0–19)

## 2023-12-07 LAB — MAGNESIUM: Magnesium: 2.1 mg/dL (ref 1.7–2.4)

## 2023-12-07 LAB — MRSA NEXT GEN BY PCR, NASAL: MRSA by PCR Next Gen: NOT DETECTED

## 2023-12-07 LAB — CK: Total CK: 218 U/L (ref 49–397)

## 2023-12-07 MED ORDER — METRONIDAZOLE 500 MG/100ML IV SOLN
500.0000 mg | Freq: Once | INTRAVENOUS | Status: AC
  Administered 2023-12-07: 500 mg via INTRAVENOUS
  Filled 2023-12-07: qty 100

## 2023-12-07 MED ORDER — METOPROLOL SUCCINATE ER 25 MG PO TB24
25.0000 mg | ORAL_TABLET | Freq: Every day | ORAL | Status: DC
  Administered 2023-12-07 – 2023-12-08 (×2): 25 mg via ORAL
  Filled 2023-12-07 (×2): qty 1

## 2023-12-07 MED ORDER — ATORVASTATIN CALCIUM 40 MG PO TABS
80.0000 mg | ORAL_TABLET | Freq: Every day | ORAL | Status: DC
  Administered 2023-12-08 – 2023-12-13 (×6): 80 mg via ORAL
  Filled 2023-12-07 (×6): qty 2

## 2023-12-07 MED ORDER — ENSURE PLUS HIGH PROTEIN PO LIQD
237.0000 mL | Freq: Two times a day (BID) | ORAL | Status: DC
  Administered 2023-12-08 – 2023-12-12 (×3): 237 mL via ORAL

## 2023-12-07 MED ORDER — PANTOPRAZOLE SODIUM 40 MG PO TBEC
40.0000 mg | DELAYED_RELEASE_TABLET | Freq: Every day | ORAL | Status: DC
  Administered 2023-12-08: 40 mg via ORAL
  Filled 2023-12-07: qty 1

## 2023-12-07 MED ORDER — LACTATED RINGERS IV SOLN: INTRAVENOUS | Status: DC

## 2023-12-07 MED ORDER — SODIUM CHLORIDE 0.9% FLUSH
3.0000 mL | Freq: Two times a day (BID) | INTRAVENOUS | Status: DC
  Administered 2023-12-07 – 2023-12-13 (×11): 3 mL via INTRAVENOUS

## 2023-12-07 MED ORDER — ACETAMINOPHEN 325 MG PO TABS: 650.0000 mg | ORAL_TABLET | Freq: Four times a day (QID) | ORAL | Status: DC | PRN

## 2023-12-07 MED ORDER — SODIUM CHLORIDE 0.9 % IV SOLN
2.0000 g | Freq: Once | INTRAVENOUS | Status: AC
  Administered 2023-12-07: 2 g via INTRAVENOUS
  Filled 2023-12-07: qty 20

## 2023-12-07 MED ORDER — DILTIAZEM HCL-DEXTROSE 125-5 MG/125ML-% IV SOLN (PREMIX)
2.5000 mg/h | INTRAVENOUS | Status: DC
  Administered 2023-12-07: 5 mg/h via INTRAVENOUS
  Administered 2023-12-08: 7.5 mg/h via INTRAVENOUS
  Filled 2023-12-07 (×2): qty 125

## 2023-12-07 MED ORDER — TAMSULOSIN HCL 0.4 MG PO CAPS
0.4000 mg | ORAL_CAPSULE | Freq: Every day | ORAL | Status: DC
  Administered 2023-12-08 – 2023-12-13 (×6): 0.4 mg via ORAL
  Filled 2023-12-07 (×6): qty 1

## 2023-12-07 MED ORDER — ESCITALOPRAM OXALATE 20 MG PO TABS
10.0000 mg | ORAL_TABLET | Freq: Every day | ORAL | Status: DC
  Administered 2023-12-08 – 2023-12-13 (×6): 10 mg via ORAL
  Filled 2023-12-07 (×6): qty 1

## 2023-12-07 MED ORDER — DILTIAZEM LOAD VIA INFUSION
15.0000 mg | Freq: Once | INTRAVENOUS | Status: AC
  Administered 2023-12-07: 15 mg via INTRAVENOUS
  Filled 2023-12-07: qty 15

## 2023-12-07 MED ORDER — SENNOSIDES-DOCUSATE SODIUM 8.6-50 MG PO TABS: 1.0000 | ORAL_TABLET | Freq: Every evening | ORAL | Status: DC | PRN

## 2023-12-07 MED ORDER — CHLORHEXIDINE GLUCONATE CLOTH 2 % EX PADS
6.0000 | MEDICATED_PAD | Freq: Every day | CUTANEOUS | Status: DC
  Administered 2023-12-07 – 2023-12-12 (×6): 6 via TOPICAL

## 2023-12-07 MED ORDER — ORAL CARE MOUTH RINSE
15.0000 mL | OROMUCOSAL | Status: DC | PRN
Start: 2023-12-07 — End: 2023-12-13

## 2023-12-07 MED ORDER — LACTATED RINGERS IV BOLUS
1000.0000 mL | Freq: Once | INTRAVENOUS | Status: AC
  Administered 2023-12-07: 1000 mL via INTRAVENOUS

## 2023-12-07 MED ORDER — UMECLIDINIUM BROMIDE 62.5 MCG/ACT IN AEPB
1.0000 | INHALATION_SPRAY | Freq: Every day | RESPIRATORY_TRACT | Status: DC
  Administered 2023-12-08 – 2023-12-13 (×6): 1 via RESPIRATORY_TRACT
  Filled 2023-12-07: qty 7

## 2023-12-07 MED ORDER — ACETAMINOPHEN 650 MG RE SUPP: 650.0000 mg | Freq: Four times a day (QID) | RECTAL | Status: DC | PRN

## 2023-12-07 MED ORDER — APIXABAN 5 MG PO TABS
5.0000 mg | ORAL_TABLET | Freq: Two times a day (BID) | ORAL | Status: DC
  Administered 2023-12-07 – 2023-12-13 (×12): 5 mg via ORAL
  Filled 2023-12-07 (×12): qty 1

## 2023-12-07 MED ORDER — LACTATED RINGERS IV BOLUS
500.0000 mL | Freq: Once | INTRAVENOUS | Status: AC
  Administered 2023-12-07: 500 mL via INTRAVENOUS

## 2023-12-07 MED ORDER — VITAMIN D 25 MCG (1000 UNIT) PO TABS
2000.0000 [IU] | ORAL_TABLET | Freq: Every day | ORAL | Status: DC
  Administered 2023-12-08 – 2023-12-13 (×6): 2000 [IU] via ORAL
  Filled 2023-12-07 (×6): qty 2

## 2023-12-07 NOTE — ED Notes (Signed)
 Report given to [facility].

## 2023-12-07 NOTE — ED Provider Notes (Signed)
 [place] [facility]MENT AT [facility] Provider Note   CSN: [id number] Arrival date & time: 12/07/23  1445     History Chief Complaint  Patient presents with   Emesis    HPI: Darryl Ward is a 78 y.o. 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 Patient arrived via POV accompanied by significant other.  History provided by patient and spouse/partner.  No interpreter required during this encounter.  Patient reports 4 days of diffuse mild abdominal pain, nausea, vomiting, diarrhea.  Reports that the diarrhea has slowed over the past 24 hours but he has been unable to tolerate any solids or liquids by mouth.  Reports that he has had subjective chills but not fevers.  Denies chest pain, shortness of breath, dysuria.  Reports that he does feel like he is in atrial fibrillation which is intermittent, however feels like he has been in it all day.  Partner at bedside states that patient has previously required cardioversion.  She denies hematemesis, coffee-ground emesis, hematochezia, melena.  Significant other states that they decided to come to the emergency department today because he has become progressively more weak over the past several days, and today could not get out of bed on his own  Patient's recorded medical, surgical, social, medication list and allergies were reviewed in the Snapshot window as part of the initial history.   Prior to Admission medications   Medication Sig Start Date End Date Taking? Authorizing Provider  albuterol  (VENTOLIN  HFA) 108 (90 Base) MCG/ACT inhaler Inhale 2 puffs into the lungs every 6 (six) hours as needed for wheezing or shortness of breath. 10/10/23   Cobb, Comer GAILS, NP  apixaban  (ELIQUIS ) 5 MG TABS tablet Take 1 tablet (5 mg total) by mouth 2 (two) times daily.  10/01/23   Cook, Jayce G, DO  atorvastatin  (LIPITOR ) 80 MG tablet Take 1 tablet (80 mg total) by mouth daily. 10/01/23   Cook, Jayce G, DO  chlorthalidone  (HYGROTON ) 25 MG tablet Take 0.5 tablets (12.5 mg total) by mouth daily. 10/20/23 01/18/24  Debera Jayson MATSU, MD  Cholecalciferol  50 MCG (2000 UT) CAPS Take 1 capsule by mouth daily.    [provider]  docusate sodium  (COLACE) 100 MG capsule Take 1 capsule (100 mg total) by mouth 2 (two) times daily. 05/19/23   Leotis Bogus, MD  escitalopram  (LEXAPRO ) 10 MG tablet Take 1 tablet (10 mg total) by mouth daily. 10/01/23   Cook, Jayce G, DO  fluticasone  (FLONASE ) 50 MCG/ACT nasal spray Place 2 sprays into both nostrils daily. 06/23/23   Cobb, Comer GAILS, NP  folic acid  (KP FOLIC ACID ) 1 MG tablet Take 1 tablet (1 mg total) by mouth daily. 10/01/23   Cook, Jayce G, DO  furosemide  (LASIX ) 20 MG tablet Take 1 tablet (20 mg total) by mouth daily as needed. 10/01/23   Cook, Jayce G, DO  HYDROcodone -acetaminophen  (NORCO/VICODIN) 5-325 MG tablet Take 1 tablet by mouth every 6 (six) hours. 05/27/23   [provider]  magnesium  oxide (MAG-OX) 400 (240 Mg) MG tablet Take 1 tablet (400 mg total) by mouth daily. 10/01/23   Cook, Jayce G, DO  meclizine  (ANTIVERT ) 25 MG tablet Take 1 tablet (25 mg total) by mouth 3 (three) times daily as needed for dizziness. Patient not taking: Reported on 11/28/2023 03/14/23   Maree Bracken D, DO  methocarbamol  (  ROBAXIN ) 500 MG tablet TAKE 1 TABLET BY MOUTH FOUR TIMES DAILY *NEW PRESCRIPTION REQUEST* 09/26/23   Cook, Jayce G, DO  metoprolol  succinate (TOPROL -XL) 25 MG 24 hr tablet Take 1 tablet (25 mg total) by mouth daily. 10/01/23   Cook, Jayce G, DO  pantoprazole  (PROTONIX ) 40 MG tablet Take 1 tablet (40 mg total) by mouth daily. 10/01/23   Cook, Jayce G, DO  tamsulosin  (FLOMAX ) 0.4 MG CAPS capsule Take 1 capsule (0.4 mg total) by mouth daily. 10/01/23   Cook, Jayce G, DO  umeclidinium bromide  (INCRUSE ELLIPTA ) 62.5  MCG/ACT AEPB Inhale 1 puff into the lungs daily.    [provider]  umeclidinium bromide  (INCRUSE ELLIPTA ) 62.5 MCG/ACT AEPB Inhale 1 puff into the lungs daily. 10/10/23   Cobb, Comer GAILS, NP     Allergies: Patient has no known allergies.   Review of Systems   ROS as per HPI  Physical Exam Updated Vital Signs BP 107/78   Pulse (!) 106   Temp 99 F (37.2 C) (Oral)   Resp 15   SpO2 98%  Physical Exam Vitals and nursing note reviewed.  Constitutional:      General: He is not in acute distress.    Appearance: He is well-developed.  HENT:     Head: Normocephalic and atraumatic.  Eyes:     Conjunctiva/sclera: Conjunctivae normal.  Cardiovascular:     Rate and Rhythm: Tachycardia present. Rhythm irregular.     Heart sounds: No murmur heard. Pulmonary:     Effort: Pulmonary effort is normal. No respiratory distress.     Breath sounds: Normal breath sounds.  Abdominal:     Palpations: Abdomen is soft.     Tenderness: There is abdominal tenderness (Mild, diffuse).  Musculoskeletal:        General: No swelling.     Cervical back: Neck supple.  Skin:    General: Skin is warm and dry.     Capillary Refill: Capillary refill takes more than 3 seconds.  Neurological:     Mental Status: He is alert.  Psychiatric:        Mood and Affect: Mood normal.     ED Course/ Medical Decision Making/ A&P    Procedures .Critical Care  Performed by: Rogelia Jerilynn RAMAN, MD Authorized by: Rogelia Jerilynn RAMAN, MD   Critical care provider statement:    Critical care time (minutes):  31   Critical care was necessary to treat or prevent imminent or life-threatening deterioration of the following conditions:  Sepsis and cardiac failure (A-fib RVR)   Critical care was time spent personally by me on the following activities:  Development of treatment plan with patient or surrogate, discussions with consultants, evaluation of patient's response to treatment, examination of patient,  ordering and review of laboratory studies, ordering and review of radiographic studies, ordering and performing treatments and interventions, pulse oximetry, re-evaluation of patient's condition and review of old charts   I assumed direction of critical care for this patient from another provider in my specialty: no     Care discussed with: admitting provider      Medications Ordered in ED Medications  lactated ringers  infusion ( Intravenous Infusion Verify 12/07/23 2106)  diltiazem  (CARDIZEM ) 1 mg/mL load via infusion 15 mg (15 mg Intravenous Bolus from Bag 12/07/23 1910)    And  diltiazem  (CARDIZEM ) 125 mg in dextrose  5% 125 mL (1 mg/mL) infusion (2.5 mg/hr Intravenous Infusion Verify 12/07/23 2106)  Chlorhexidine  Gluconate Cloth 2 % PADS 6 each (  6 each Topical Given 12/07/23 2100)  Oral care mouth rinse (has no administration in time range)  lactated ringers  bolus 1,000 mL (0 mLs Intravenous Stopped 12/07/23 1704)  lactated ringers  bolus 1,000 mL (0 mLs Intravenous Stopped 12/07/23 1853)  cefTRIAXone  (ROCEPHIN ) 2 g in sodium chloride  0.9 % 100 mL IVPB (0 g Intravenous Stopped 12/07/23 1853)  metroNIDAZOLE (FLAGYL) IVPB 500 mg (0 mg Intravenous Stopped 12/07/23 1953)    Medical Decision Making:   Darryl Ward is a 78 y.o. male who presents for multiple symptoms as per above.  Physical exam is pertinent for tachycardia with a regular rate, decreased capillary refill, mild diffuse abdominal tenderness.   The differential includes but is not limited to enteritis, AKI, electrolyte derangement, sepsis, NSTEMI, UTI, viral infection, rhabdomyolysis.  Independent historian: Spouse/partner  External data reviewed: {LSEXTERNALDATA:33407}  Initial Plan:  Screening labs including CBC and Metabolic panel to evaluate for infectious or metabolic etiology of disease.  Lipase to evaluate for pancreatitis in the setting of vomiting COVID/flu/RSV to evaluate for viral etiology of nausea/vomiting,  diarrhea Single blood culture given patient with significant tachycardia BNP to further evaluate volume status Lactic acid to screen for sepsis Magnesium  to evaluate for electrolyte derangements in setting of dehydration CK to evaluate for rhabdomyolysis Single blood culture given concern for possible sepsis Urinalysis with reflex culture ordered to evaluate for UTI or relevant urologic/nephrologic pathology.  EKG and single troponin to evaluate for cardiac pathology. Single troponin appropriate due to greater than 6 hours since maximal intensity of symptoms. Objective evaluation as below reviewed   Labs: Ordered, Independent interpretation, and Details: ***  Radiology: {LSRADS:33417} CT ABDOMEN PELVIS WO CONTRAST Result Date: 12/07/2023 CLINICAL DATA:  Abdominal pain and vomiting 3 days. History of lung cancer. EXAM: CT ABDOMEN AND PELVIS WITHOUT CONTRAST TECHNIQUE: Multidetector CT imaging of the abdomen and pelvis was performed following the standard protocol without IV contrast. RADIATION DOSE REDUCTION: This exam was performed according to the departmental dose-optimization program which includes automated exposure control, adjustment of the mA and/or kV according to patient size and/or use of iterative reconstruction technique. COMPARISON:  CT chest 09/10/2023 and PET-CT 06/22/2021 FINDINGS: Lower chest: Heart is normal size. Calcified plaque over the left main and 3 vessel coronary arteries. Calcified plaque over the descending thoracic aorta. Visualized lung bases demonstrate no acute findings. Thin walled cyst over the left lower lobe unchanged. Minimal wall thickening of the distal esophagus which may be due to reflux esophagitis. Hepatobiliary: Mild cholelithiasis. Liver and biliary tree are normal. Pancreas: Normal. Spleen: Normal Adrenals/Urinary Tract: Adrenal glands are normal. Kidneys are normal in size. There is no hydronephrosis. Multiple calcifications over the kidneys most of  which are likely vascular. Few small bilateral renal cortical hypodensities without significant change likely cysts. Ureters and bladder are normal. Stomach/Bowel: Stomach and small bowel are normal. Appendix is normal. Colon is unremarkable. 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 Just below the SMA, the aorta measures 3.9 x 4 cm without significant change. Remaining vascular structures are unremarkable. No adenopathy. Reproductive: Prostate is unremarkable. Other: No free fluid or focal inflammatory change. Small midline epigastric ventral hernia containing only peritoneal fat. Musculoskeletal: Right hip arthroplasty intact. Posterior fusion hardware intact at the L5-S1 level. No focal bony abnormality or acute process. IMPRESSION: 1. No acute findings in the abdomen/pelvis. 2. Mild cholelithiasis. 3.  Bilateral aortofemoral bypass graft unchanged. 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 Just below the SMA, the aorta measures 3.9 x 4 cm without significant change. Recommend vascular consultation if not done previously as well as surveillance with follow-up CT 12 months. 4. Mild uniform wall thickening of the distal esophagus which may be seen with reflux esophagitis. 5. Small midline epigastric ventral hernia containing only peritoneal fat. 6. Aortic atherosclerosis. Atherosclerotic coronary artery disease. Aortic Atherosclerosis (ICD10-I70.0). Electronically Signed   By: Toribio Agreste M.D.   On: 12/07/2023 17:47    EKG/Medicine tests: Ordered and Independent interpretation EKG Interpretation: Atrial fibrillation with rapid V-rate Repolarization abnormality, prob rate related Confirmed by Rogelia Satterfield ([id number]) on  12/07/2023 3:21:31 PM                Interventions: LR bolus, ceftriaxone , Flagyl, diltiazem   See the EMR for full details regarding lab and imaging results.  Patient presents to the emergency department, is in A-fib RVR, however is normotensive, afebrile, normal respiratory rate, therefore not activated as a code sepsis.  Given patient reports p.o. intolerance, significant volume losses, feel that A-fib RVR might be secondary to dehydration, therefore will not initially treat with medications but will rehydrate with fluid bolus.  Patient warrants broad lab workup as per above.  Lab workup demonstrates lactic acid elevation to 2.1, this in combination with patient's abdominal pain, tachycardia, is concerning for sepsis, therefore patient activated as a code sepsis, received ceftriaxone  and Flagyl, second blood culture was drawn.  Labs otherwise demonstrate AKI to 1.5 from baseline of 1-1.3.  Slight leukocytosis.  No evidence of rhabdomyolysis, no evidence of UTI.  Magnesium  and lipase WNL.  Patient did have mild elevation of troponin of 44 and 51, likely demand ischemia in the setting of A-fib RVR.  Patient was negative for COVID, flu, RSV, therefore these are not the likely cause of patient's sepsis.  Given patient's abdominal pain and tenderness, CT of the abdomen pelvis was attained and does not demonstrate clear etiology of patient's abdominal pain or sepsis, possible viral gastroenteritis resulting in sepsis.  Despite 2 L fluid bolus, patient had uptrending lactic acid to 2.8 from 1.2, and was persistently tachycardic, feel that patient's A-fib RVR may be contributing to the lactic acid elevation, therefore started on diltiazem  GGT.  Patient had improvement in his heart rate, though did briefly drop his blood pressure, therefore rate was cut down to 2.5 with improvement in blood pressure.  Thereafter lactic acid normalized at 1.7.  Overall given patient's A-fib RVR, AKI, sepsis, feel that he warrants  admission.  [facility] was consulted, spoke with Dr. Charlton, who accepted the patient to his service, patient transferred to [facility] for further management.  Presentation is most consistent with acute complicated illness and Current presentation is complicated by underlying chronic conditions  Discussion of management or test interpretations with external provider(s): Dr. Charlton, hospitalists  Risk Drugs:Prescription drug management Treatment: Decision regarding hospitalization Critical Care: 31 minutes  Disposition: {LSDISPO:33388}  MDM generated using voice dictation software and may contain dictation errors.  Please contact me for any clarification or with any questions.  Clinical Impression: No diagnosis found.   Admit   Final Clinical Impression(s) / ED Diagnoses Final diagnoses:  None    Rx / DC Orders ED Discharge Orders     None

## 2023-12-07 NOTE — ED Notes (Signed)
 Pt aware of the need for a urine... Pt currently unable to provide a sample.SABRASABRASABRA

## 2023-12-07 NOTE — ED Notes (Signed)
 MD verbal to switch Card Dose to 2.5mg /h... Due to Pts BP and HR.SABRASABRA

## 2023-12-07 NOTE — Progress Notes (Signed)
 Plan of Care Note for accepted transfer   Patient: Darryl Ward MRN: [id number]   DOA: 12/07/2023  Facility requesting transfer: [facility]   Requesting Provider: Dr. Rogelia   Reason for transfer: Rapid atrial fibrillation   Facility course: 78 yr old man with HTN, HLD, COPD, lung cancer s/p lobectomy and chemotherapy, PAD, CAD, and a fib on Eliquis  presenting with 3 days of N/V.   He is found to be in rapid a fib. WBC is 12.9 with lactic acid 2.1 then 2.8. LFTs and lipase normal and there are no acute findings on CT abdomen/pelvis.  He was given 2 liters LR, Rocephin , and Flagyl, and was started on IV diltiazem .   Plan of care: The patient is accepted for admission to [facility] unit, at [facility].   Author: Evalene GORMAN Sprinkles, MD 12/07/2023  Check [email or web address] for on-call coverage.  Nursing staff, Please call [facility] Admits & Consults System-Wide number on Amion as soon as patient's arrival, so appropriate admitting provider can evaluate the pt.

## 2023-12-07 NOTE — ED Notes (Signed)
 CRITICAL VALUE STICKER  CRITICAL VALUE:Lactate 2.1  RECEIVER (on-site recipient of call):ONEIDA Sharps, RN  DATE & TIME NOTIFIED:   MESSENGER (representative from lab):  MD NOTIFIED: Stanek  TIME OF NOTIFICATION:1710  RESPONSE:

## 2023-12-07 NOTE — ED Notes (Signed)
 Lab called, Lactic 2.8.SABRASABRA Provider informed.SABRASABRA

## 2023-12-07 NOTE — ED Notes (Signed)
 Purple man green.

## 2023-12-07 NOTE — ED Triage Notes (Signed)
 He c/o occasional and persistent vomiting x 3 days. He tells us  he has lung cancer. His wife is with him. He is pale, with hid skin being warm and dry and he is breathing normally.

## 2023-12-07 NOTE — Sepsis Progress Note (Signed)
 Sepsis protocol is being followed by [facility].

## 2023-12-07 NOTE — H&P (Signed)
 History and Physical    Darryl Ward FMW:[id number] DOB: 08-Dec-1945 DOA: 12/07/2023  DOS: the patient was seen and examined on 12/07/2023  PCP: Cook, Jayce G, DO   Patient coming from: Home  I have personally briefly reviewed patient's old medical records in [facility] and CareEverywhere  HPI:   Darryl Ward is a 78 y.o. year old male with medical history of hypertension, hyperlipidemia complicated by CAD and PAD, paroxysmal A-fib, CHF (EF 55% in 02/2022) COPD and history of lung cancer presenting to the ED with malaise and fatigue after GI symptoms.   Pt states he developed GI symptoms starting on Thursday and continuing into the weekend. He does not report eating anything out of the ordinary. He ate mexican food that he states is very good and his wife ate it as well but she did not get sick. He reports symptoms of n/v/d but states he decreased his intake further and the symptoms improved. He developed significant weakness today that prompted him to seek care. He denies any CP, SHOB or flu like symptoms.    On arrival to the ED patient was noted to be HDS stable.  Lab work and imaging obtained.  CBC with leukocytosis at 12.9, CMP with mild hypokalemia at 3.4, slight creatinine elevation at 1.5 with baseline around 1.2-1.3, lipase normal, UA normal, BNP slightly elevated at 617, CK normal, magnesium  normal, lactic acid elevated at 2.8 with repeat showing normalization.  CTAP without any acute findings but did show abdominal aortic aneurysm at 4.4 cm.  Patient noted to be in A-fib with RVR, so for further care [facility] contacted for admission.  Review of Systems: As mentioned in the history of present illness. All other systems reviewed and are negative.   Past Medical History:  Diagnosis Date   AAA (abdominal aortic aneurysm)    Anemia    Arthritis    Blood transfusion without reported diagnosis 10/2021   Broken hip (HCC)    Cancer (HCC)    Carotid artery disease     Nonobstructive   Cataract    CHF (congestive heart failure) (HCC)    COPD (chronic obstructive pulmonary disease) (HCC)    Coronary atherosclerosis of native coronary artery    PTCA small diagonal 2007 otherwise nonobstructive CAD   Depression    Dysrhythmia    Essential hypertension, benign    GERD (gastroesophageal reflux disease)    Hyperlipidemia    NSTEMI (non-ST elevated myocardial infarction) (HCC) 2007   Stroke [facility]) 2004   Stroke [facility]) 2025   TIA (transient ischemic attack) 2006    Past Surgical History:  Procedure Laterality Date   AORTA - BILATERAL FEMORAL ARTERY BYPASS GRAFT  01/08/2012   Procedure: AORTA BIFEMORAL BYPASS GRAFT;  Surgeon: Carlin FORBES Haddock, MD;  Location: [facility] OR;  Service: Vascular;  Laterality: Bilateral;  using 18x59mm x 40cm Hemashield Gold Vascular Graft with Endarterectomy, Thombectomy and  Reimplantation of Inferior Mesenteric Artery   BACK SURGERY  2021   BRONCHIAL BIOPSY  06/11/2021   Procedure: BRONCHIAL BIOPSIES;  Surgeon: Shelah Lamar RAMAN, MD;  Location: [facility] ENDOSCOPY;  Service: Pulmonary;;   BRONCHIAL BRUSHINGS  06/11/2021   Procedure: BRONCHIAL BRUSHINGS;  Surgeon: Shelah Lamar RAMAN, MD;  Location: [facility] ENDOSCOPY;  Service: Pulmonary;;   BRONCHIAL NEEDLE ASPIRATION BIOPSY  06/11/2021   Procedure: BRONCHIAL NEEDLE ASPIRATION BIOPSIES;  Surgeon: Shelah Lamar RAMAN, MD;  Location: [facility] ENDOSCOPY;  Service: Pulmonary;;   CARDIOVERSION N/A 09/20/2021   Procedure: CARDIOVERSION;  Surgeon: Debera,  Jayson MATSU, MD;  Location: [facility] ORS;  Service: Cardiovascular;  Laterality: N/A;   COLONOSCOPY N/A 04/14/2019   Procedure: COLONOSCOPY;  Surgeon: Shaaron Lamar HERO, MD;  Location: [facility] ENDO SUITE;  Service: Endoscopy;  Laterality: N/A;  9:30   COLONOSCOPY WITH PROPOFOL  N/A 07/25/2021   Procedure: COLONOSCOPY WITH PROPOFOL ;  Surgeon: Cindie Carlin POUR, DO;  Location: [facility] ENDO SUITE;  Service: Endoscopy;  Laterality: N/A;  1:00pm   EYE SURGERY     FIDUCIAL MARKER PLACEMENT   06/11/2021   Procedure: FIDUCIAL MARKER PLACEMENT;  Surgeon: Shelah Lamar RAMAN, MD;  Location: [facility] ENDOSCOPY;  Service: Pulmonary;;   FRACTURE SURGERY     HIP ARTHROPLASTY Right 04/28/2022   Procedure: ARTHROPLASTY BIPOLAR HIP (HEMIARTHROPLASTY);  Surgeon: Beverley Evalene BIRCH, MD;  Location: [facility] OR;  Service: Orthopedics;  Laterality: Right;   INTERCOSTAL NERVE BLOCK Right 11/12/2021   Procedure: INTERCOSTAL NERVE BLOCK;  Surgeon: Kerrin Elspeth BROCKS, MD;  Location: [facility] OR;  Service: Thoracic;  Laterality: Right;   IR IMAGING GUIDED PORT INSERTION  07/31/2021   JOINT REPLACEMENT     Left cataract surgery     LYMPH NODE DISSECTION Right 11/12/2021   Procedure: LYMPH NODE DISSECTION;  Surgeon: Kerrin Elspeth BROCKS, MD;  Location: [facility] OR;  Service: Thoracic;  Laterality: Right;   POLYPECTOMY  04/14/2019   Procedure: POLYPECTOMY;  Surgeon: Shaaron Lamar HERO, MD;  [facility] ENDO SUITE;  Service: Endoscopy;;   POLYPECTOMY  07/25/2021   Procedure: POLYPECTOMY;  Surgeon: Cindie Carlin POUR, DO;  Location: [facility] ENDO SUITE;  Service: Endoscopy;;   SPINE SURGERY     TEE WITHOUT CARDIOVERSION N/A 09/20/2021   Procedure: TRANSESOPHAGEAL ECHOCARDIOGRAM (TEE);  Surgeon: Debera Jayson MATSU, MD;  Location: [facility] ORS;  Service: Cardiovascular;  Laterality: N/A;   TOTAL HIP ARTHROPLASTY Right 05/15/2023   Procedure: RIGHT HIP HEMIARTHROPLASTY REVISION WITH OPEN REDUCTION INTERNAL FIXATION OF PERIPROSTHETIC FEMUR FRACTURE;  Surgeon: Beverley Evalene BIRCH, MD;  Location: [facility] ORS;  Service: Orthopedics;  Laterality: Right;   TRANSFORAMINAL LUMBAR INTERBODY FUSION (TLIF) WITH PEDICLE SCREW FIXATION 1 LEVEL N/A 04/27/2020   Procedure: Transforaminal Lumbar Interbody Fusion Lumbar Five-Sacral One;  Surgeon: Debby Dorn MATSU, MD;  Location: [facility] OR;  Service: Neurosurgery;  Laterality: N/A;   VIDEO BRONCHOSCOPY WITH INSERTION OF INTERBRONCHIAL VALVE (IBV) N/A 11/22/2021   Procedure: VIDEO BRONCHOSCOPY WITH INSERTION OF  INTERBRONCHIAL VALVE (IBV);  Surgeon: Kerrin Elspeth BROCKS, MD;  Location: [facility] OR;  Service: Thoracic;  Laterality: N/A;   VIDEO BRONCHOSCOPY WITH INSERTION OF INTERBRONCHIAL VALVE (IBV) N/A 01/24/2022   Procedure: VIDEO BRONCHOSCOPY WITH REMOVAL OF INTERBRONCHIAL VALVE (IBV);  Surgeon: Kerrin Elspeth BROCKS, MD;  [name] OR;  Service: Thoracic;  Laterality: N/A;   VIDEO BRONCHOSCOPY WITH RADIAL ENDOBRONCHIAL ULTRASOUND  06/11/2021   Procedure: VIDEO BRONCHOSCOPY WITH RADIAL ENDOBRONCHIAL ULTRASOUND;  Surgeon: Shelah Lamar RAMAN, MD;  Location: [facility] ENDOSCOPY;  Service: Pulmonary;;     No Known Allergies  Family History  Problem Relation Age of Onset   Hearing loss Mother    Miscarriages / Stillbirths Mother    Vision loss Mother    Hyperlipidemia Sister    Hypertension Sister    Stroke Sister    Heart attack Brother 41   Cancer Brother    Hearing loss Brother    Heart disease Brother    Hypertension Brother    Hearing loss Brother    Heart disease Brother    Hypertension Brother    Cancer - Colon Neg Hx     Prior  to Admission medications   Medication Sig Start Date End Date Taking? Authorizing Provider  albuterol  (VENTOLIN  HFA) 108 (90 Base) MCG/ACT inhaler Inhale 2 puffs into the lungs every 6 (six) hours as needed for wheezing or shortness of breath. 10/10/23   Cobb, Comer GAILS, NP  apixaban  (ELIQUIS ) 5 MG TABS tablet Take 1 tablet (5 mg total) by mouth 2 (two) times daily. 10/01/23   Cook, Jayce G, DO  atorvastatin  (LIPITOR ) 80 MG tablet Take 1 tablet (80 mg total) by mouth daily. 10/01/23   Cook, Jayce G, DO  chlorthalidone  (HYGROTON ) 25 MG tablet Take 0.5 tablets (12.5 mg total) by mouth daily. 10/20/23 01/18/24  Debera Jayson MATSU, MD  Cholecalciferol  50 MCG (2000 UT) CAPS Take 1 capsule by mouth daily.    [provider]  docusate sodium  (COLACE) 100 MG capsule Take 1 capsule (100 mg total) by mouth 2 (two) times daily. 05/19/23   Leotis Bogus, MD  escitalopram   (LEXAPRO ) 10 MG tablet Take 1 tablet (10 mg total) by mouth daily. 10/01/23   Cook, Jayce G, DO  fluticasone  (FLONASE ) 50 MCG/ACT nasal spray Place 2 sprays into both nostrils daily. 06/23/23   Cobb, Comer GAILS, NP  folic acid  (KP FOLIC ACID ) 1 MG tablet Take 1 tablet (1 mg total) by mouth daily. 10/01/23   Cook, Jayce G, DO  furosemide  (LASIX ) 20 MG tablet Take 1 tablet (20 mg total) by mouth daily as needed. 10/01/23   Cook, Jayce G, DO  HYDROcodone -acetaminophen  (NORCO/VICODIN) 5-325 MG tablet Take 1 tablet by mouth every 6 (six) hours. 05/27/23   [provider]  magnesium  oxide (MAG-OX) 400 (240 Mg) MG tablet Take 1 tablet (400 mg total) by mouth daily. 10/01/23   Cook, Jayce G, DO  meclizine  (ANTIVERT ) 25 MG tablet Take 1 tablet (25 mg total) by mouth 3 (three) times daily as needed for dizziness. Patient not taking: Reported on 11/28/2023 03/14/23   Maree Bracken D, DO  methocarbamol  (ROBAXIN ) 500 MG tablet TAKE 1 TABLET BY MOUTH FOUR TIMES DAILY *NEW PRESCRIPTION REQUEST* 09/26/23   Cook, Jayce G, DO  metoprolol  succinate (TOPROL -XL) 25 MG 24 hr tablet Take 1 tablet (25 mg total) by mouth daily. 10/01/23   Cook, Jayce G, DO  pantoprazole  (PROTONIX ) 40 MG tablet Take 1 tablet (40 mg total) by mouth daily. 10/01/23   Cook, Jayce G, DO  tamsulosin  (FLOMAX ) 0.4 MG CAPS capsule Take 1 capsule (0.4 mg total) by mouth daily. 10/01/23   Cook, Jayce G, DO  umeclidinium bromide  (INCRUSE ELLIPTA ) 62.5 MCG/ACT AEPB Inhale 1 puff into the lungs daily.    [provider]  umeclidinium bromide  (INCRUSE ELLIPTA ) 62.5 MCG/ACT AEPB Inhale 1 puff into the lungs daily. 10/10/23   Malachy Comer GAILS, NP    Social History:  reports that he quit smoking about 2 years ago. His smoking use included cigarettes. He started smoking about 42 years ago. He has a 40 pack-year smoking history. He has never used smokeless tobacco. He reports that he does not drink alcohol and does not use drugs. Lives with wife Tobacco-  Previous use but denies any  EtOH- Denies use.  Illicit drug use- denies use.  IADLs/ADLs- can perform independently at baseline    Physical Exam: Vitals:   12/07/23 1950 12/07/23 2000 12/07/23 2015 12/07/23 2100  BP: (!) 61/40 116/73 107/78   Pulse: (!) 104 (!) 49 (!) 106   Resp: 15 20 15    Temp:    98.6 F (37 C)  TempSrc:    Oral  SpO2: 97% 99% 98%   Weight:    76.2 kg  Height:    5' 10 (1.778 m)     Gen: NAD HENT: NCAT, Dry MM CV: IRIR, good pulses, No LE edema, No JVD Resp: CTAB Abd: No TTP, normal bowel sounds MSK: no asymmetry Skin: no lesions on skin Neuro: alert and oriented x4 Psych: pleasant mood   Labs on Admission: I have personally reviewed following labs and imaging studies  CBC: Recent Labs  Lab 12/07/23 1551  WBC 12.9*  HGB 13.9  HCT 40.0  MCV 92.8  PLT 173   Basic Metabolic Panel: Recent Labs  Lab 12/07/23 1551  NA 139  K 3.4*  CL 98  CO2 25  GLUCOSE 120*  BUN 31*  CREATININE 1.50*  CALCIUM  10.2  MG 2.1   GFR: Estimated Creatinine Clearance: 43.3 mL/min (A) (by C-G formula based on SCr of 1.5 mg/dL (H)). Liver Function Tests: Recent Labs  Lab 12/07/23 1551  AST 24  ALT 9  ALKPHOS 78  BILITOT 1.0  PROT 7.4  ALBUMIN  4.0   Recent Labs  Lab 12/07/23 1551  LIPASE 12   No results for input(s): AMMONIA in the last 168 hours. Coagulation Profile: No results for input(s): INR, PROTIME in the last 168 hours. Cardiac Enzymes: Recent Labs  Lab 12/07/23 1551  CKTOTAL 218   BNP (last 3 results) No results for input(s): BNP in the last 8760 hours. HbA1C: No results for input(s): HGBA1C in the last 72 hours. CBG: No results for input(s): GLUCAP in the last 168 hours. Lipid Profile: No results for input(s): CHOL, HDL, LDLCALC, TRIG, CHOLHDL, LDLDIRECT in the last 72 hours. Thyroid  Function Tests: No results for input(s): TSH, T4TOTAL, FREET4, T3FREE, THYROIDAB in the last 72  hours. Anemia Panel: No results for input(s): VITAMINB12, FOLATE, FERRITIN, TIBC, IRON, RETICCTPCT in the last 72 hours. Urine analysis:    Component Value Date/Time   COLORURINE YELLOW 12/07/2023 1500   APPEARANCEUR CLEAR 12/07/2023 1500   LABSPEC 1.021 12/07/2023 1500   PHURINE 6.0 12/07/2023 1500   GLUCOSEU NEGATIVE 12/07/2023 1500   HGBUR TRACE (A) 12/07/2023 1500   BILIRUBINUR NEGATIVE 12/07/2023 1500   KETONESUR NEGATIVE 12/07/2023 1500   PROTEINUR NEGATIVE 12/07/2023 1500   UROBILINOGEN 1.0 01/10/2012 1728   NITRITE NEGATIVE 12/07/2023 1500   LEUKOCYTESUR NEGATIVE 12/07/2023 1500    Radiological Exams on Admission: I have personally reviewed images CT ABDOMEN PELVIS WO CONTRAST Result Date: 12/07/2023 CLINICAL DATA:  Abdominal pain and vomiting 3 days. History of lung cancer. EXAM: CT ABDOMEN AND PELVIS WITHOUT CONTRAST TECHNIQUE: Multidetector CT imaging of the abdomen and pelvis was performed following the standard protocol without IV contrast. RADIATION DOSE REDUCTION: This exam was performed according to the departmental dose-optimization program which includes automated exposure control, adjustment of the mA and/or kV according to patient size and/or use of iterative reconstruction technique. COMPARISON:  CT chest 09/10/2023 and PET-CT 06/22/2021 FINDINGS: Lower chest: Heart is normal size. Calcified plaque over the left main and 3 vessel coronary arteries. Calcified plaque over the descending thoracic aorta. Visualized lung bases demonstrate no acute findings. Thin walled cyst over the left lower lobe unchanged. Minimal wall thickening of the distal esophagus which may be due to reflux esophagitis. Hepatobiliary: Mild cholelithiasis. Liver and biliary tree are normal. Pancreas: Normal. Spleen: Normal Adrenals/Urinary Tract: Adrenal glands are normal. Kidneys are normal in size. There is no hydronephrosis. Multiple calcifications over the kidneys most of which  are  likely vascular. Few small bilateral renal cortical hypodensities without significant change likely cysts. Ureters and bladder are normal. Stomach/Bowel: Stomach and small bowel are normal. Appendix is normal. Colon is unremarkable. 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 Just below the SMA, the aorta measures 3.9 x 4 cm without significant change. Remaining vascular structures are unremarkable. No adenopathy. Reproductive: Prostate is unremarkable. Other: No free fluid or focal inflammatory change. Small midline epigastric ventral hernia containing only peritoneal fat. Musculoskeletal: Right hip arthroplasty intact. Posterior fusion hardware intact at the L5-S1 level. No focal bony abnormality or acute process. IMPRESSION: 1. No acute findings in the abdomen/pelvis. 2. Mild cholelithiasis. 3. Bilateral aortofemoral bypass graft unchanged. 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 Just below the SMA, the aorta measures 3.9 x 4 cm without significant change. Recommend vascular consultation if not done previously as well as surveillance with follow-up CT 12 months. 4. Mild uniform wall thickening of the distal esophagus which may be seen with reflux esophagitis. 5. Small midline epigastric ventral hernia containing only peritoneal fat. 6. Aortic atherosclerosis. Atherosclerotic coronary artery disease. Aortic Atherosclerosis (ICD10-I70.0). Electronically Signed   By: Toribio Agreste M.D.   On: 12/07/2023 17:47    EKG: My personal interpretation of EKG shows: afib with RVR   Assessment/Plan Principal Problem:   Atrial fibrillation with rapid ventricular response (HCC) Active Problems:   GERD without esophagitis   Mixed  hyperlipidemia   Coronary atherosclerosis of native coronary artery   PVD (peripheral vascular disease) (HCC)   COPD (chronic obstructive pulmonary disease) (HCC)   Primary adenocarcinoma of upper lobe of right lung (HCC)   Essential hypertension Afib with RVR Patient with history of paroxysmal A-fib presenting with A-fib with RVR.  K was 3.4 and Mag was 2.1. Will replete with goal of 4 and 2 respectively. OP regimen is metoprolol  rate control. He is anticoagulated with Eliquis .Troponins with slight uptrend but will continue trending until peak. On cardizem  gtt and will continue for now. Restarted home beta blocker. Suspect RVR is secondary to volume depletion from GI illness.   Gastroenteritis: resolving per patient.   AKI: secondary to dehydration from gastroenteritis. Continue IVF. Monitor creatinine.   Abdominal aortic aneurysm: Noted to be 4.4 cm.  Repeat imaging recommended at 12 months with outpatient vascular follow-up.  Lactic acidosis: Resolved with IVF.  Chronic problems: restart home meds once med rec is complete.  HTN: Holding BP meds given RVR, will restart once able.  HLD: restart home statin. GERD: continue home PPI COPD: resume home inhalers Lung Cancer: Pt states it is in remission. He follows with oncology OP. Advised to continue his follow up.    VTE prophylaxis:  Eliquis   Diet: HH Code Status:  DNR/DNI (Updated on 11/16) Telemetry:  Admission status: Inpatient, Progressive Patient is from: Home Anticipated d/c is to: Home Anticipated d/c is in: 3-4 days   Family Communication: Updated at bedside  Consults called: None   Severity of Illness: The appropriate patient status for this patient is INPATIENT. Inpatient status is judged to be reasonable and necessary in order to provide the required intensity of service to ensure the patient's safety. The patient's presenting symptoms, physical exam findings, and initial radiographic and laboratory data in the  context of  their chronic comorbidities is felt to place them at high risk for further clinical deterioration. Furthermore, it is not anticipated that the patient will be medically stable for discharge from the hospital within 2 midnights of admission.   * I certify that at the point of admission it is my clinical judgment that the patient will require inpatient hospital care spanning beyond 2 midnights from the point of admission due to high intensity of service, high risk for further deterioration and high frequency of surveillance required.DEWAINE Morene Bathe, MD Jolynn DEL. [facility]

## 2023-12-07 NOTE — ED Notes (Signed)
 Cardizem  stopped due to Pts V/S... Provider informed... Pt Aox4... No change in mental status... BP confirmed with Manual, 70Palpated.SABRASABRA

## 2023-12-08 DIAGNOSIS — I5030 Unspecified diastolic (congestive) heart failure: Secondary | ICD-10-CM | POA: Diagnosis not present

## 2023-12-08 DIAGNOSIS — I2510 Atherosclerotic heart disease of native coronary artery without angina pectoris: Secondary | ICD-10-CM | POA: Diagnosis not present

## 2023-12-08 DIAGNOSIS — I4891 Unspecified atrial fibrillation: Secondary | ICD-10-CM | POA: Diagnosis not present

## 2023-12-08 DIAGNOSIS — I739 Peripheral vascular disease, unspecified: Secondary | ICD-10-CM | POA: Diagnosis not present

## 2023-12-08 LAB — BASIC METABOLIC PANEL WITH GFR
Anion gap: 11 (ref 5–15)
BUN: 28 mg/dL — ABNORMAL HIGH (ref 8–23)
CO2: 24 mmol/L (ref 22–32)
Calcium: 8.9 mg/dL (ref 8.9–10.3)
Chloride: 102 mmol/L (ref 98–111)
Creatinine, Ser: 1.13 mg/dL (ref 0.61–1.24)
GFR, Estimated: >60 mL/min (ref >60)
Glucose, Bld: 99 mg/dL (ref 70–99)
Potassium: 3.1 mmol/L — ABNORMAL LOW (ref 3.5–5.1)
Sodium: 138 mmol/L (ref 135–145)

## 2023-12-08 LAB — TROPONIN T, HIGH SENSITIVITY
Troponin T High Sensitivity: 65 ng/L — ABNORMAL HIGH (ref 0–19)
Troponin T High Sensitivity: 70 ng/L — ABNORMAL HIGH (ref 0–19)

## 2023-12-08 LAB — CBC
HCT: 32.8 % — ABNORMAL LOW (ref 39.0–52.0)
Hemoglobin: 11.2 g/dL — ABNORMAL LOW (ref 13.0–17.0)
MCH: 31.9 pg (ref 26.0–34.0)
MCHC: 34.1 g/dL (ref 30.0–36.0)
MCV: 93.4 fL (ref 80.0–100.0)
Platelets: 153 K/uL (ref 150–400)
RBC: 3.51 MIL/uL — ABNORMAL LOW (ref 4.22–5.81)
RDW: 14.1 % (ref 11.5–15.5)
WBC: 10.2 K/uL (ref 4.0–10.5)
nRBC: 0 % (ref 0.0–0.2)

## 2023-12-08 LAB — MAGNESIUM: Magnesium: 1.6 mg/dL — ABNORMAL LOW (ref 1.7–2.4)

## 2023-12-08 LAB — LACTIC ACID, PLASMA
Lactic Acid, Venous: 1.4 mmol/L (ref 0.5–1.9)
Lactic Acid, Venous: 1.3 mmol/L (ref 0.5–1.9)

## 2023-12-08 MED ORDER — ONDANSETRON HCL 4 MG/2ML IJ SOLN: 4.0000 mg | Freq: Four times a day (QID) | INTRAMUSCULAR | Status: DC | PRN

## 2023-12-08 MED ORDER — ONDANSETRON HCL 4 MG/2ML IJ SOLN
INTRAMUSCULAR | Status: AC
  Administered 2023-12-08: 4 mg via INTRAVENOUS
  Filled 2023-12-08: qty 2

## 2023-12-08 MED ORDER — POTASSIUM CHLORIDE CRYS ER 20 MEQ PO TBCR
40.0000 meq | EXTENDED_RELEASE_TABLET | ORAL | Status: AC
  Administered 2023-12-08 (×2): 40 meq via ORAL
  Filled 2023-12-08 (×2): qty 2

## 2023-12-08 MED ORDER — LACTATED RINGERS IV SOLN: INTRAVENOUS | Status: DC

## 2023-12-08 MED ORDER — PANTOPRAZOLE SODIUM 40 MG IV SOLR
40.0000 mg | INTRAVENOUS | Status: DC
  Administered 2023-12-09 – 2023-12-10 (×2): 40 mg via INTRAVENOUS
  Filled 2023-12-08 (×2): qty 10

## 2023-12-08 MED ORDER — METOPROLOL TARTRATE 12.5 MG HALF TABLET
12.5000 mg | ORAL_TABLET | Freq: Four times a day (QID) | ORAL | Status: DC
  Administered 2023-12-08 – 2023-12-09 (×3): 12.5 mg via ORAL
  Filled 2023-12-08 (×3): qty 1

## 2023-12-08 NOTE — Consult Note (Signed)
 Cardiology Consultation:  Patient ID: Darryl Ward MRN: [id number]; DOB: 07/18/1945 Admit date: 12/07/2023 Date of Consult: 12/08/2023  Primary Care Provider: Bluford Jacqulyn MATSU, DO Primary Cardiologist: [name], MD  Primary Electrophysiologist:  None   Patient Profile:  Darryl Ward is a 78 y.o. male with a hx of PAD s/p bilateral aortofemoral bypass graft, CAD s/p D1 PCI (2017), pAF, HTN, HFpEF who is being seen today for the evaluation of Afib with RVR at the request of Dr Cheryle.  History of Present Illness:  [name] presents with 4 days of nausea, vomiting, diarrhea, inability to take PO and generalized weakness. He presented for further evaluation and was found to be in Afib with RVR and started on dilt gtt. He reports feeling better today and is now able to keep things down. He denies any chest palpitations, dyspnea, orthopnea, PND, or LE edema.   Past Medical History: PAD s/p bilateral aortofemoral bypass graft, CAD s/p D1 PCI (2017), pAF, HTN, HFpEF  Past Surgical History: D1 PCI (2017), bilateral aortofemoral bypass graft  Allergies:    No Known Allergies  Social History: 40-pack tobacco use  Family History:   Noncontributory  ROS:  All other ROS reviewed and negative. Pertinent positives noted in the HPI.     Physical Exam/Data:   Vitals:   12/08/23 0800 12/08/23 0900 12/08/23 0944 12/08/23 1000  BP: (!) 100/59 (!) 107/54 112/62 (!) 103/53  Pulse: 75 72 89 77  Resp: 13 16 12 12   Temp: (!) 97.5 F (36.4 C)     TempSrc: Oral     SpO2: 95% 95% 99% 100%  Weight:      Height:        Intake/Output Summary (Last 24 hours) at 12/08/2023 1300 Last data filed at 12/08/2023 1212 Gross per 24 hour  Intake 5343.25 ml  Output 300 ml  Net 5043.25 ml       12/08/2023    5:00 AM 12/07/2023    9:00 PM 11/28/2023    1:57 PM  Last 3 Weights  Weight (lbs) 181 lb 168 lb 180 lb  Weight (kg) 82.101 kg 76.204 kg 81.647 kg    Body mass index is 25.97 kg/m.   General: Well nourished, well developed, in no acute distress HEENT: Atraumatic, no JVD Cardiac: Normal S1, S2; RRR; no murmurs, rubs, or gallops Lungs:CTAB, no wheezing, rhonchi or rales  Abd: Soft, nontender, no hepatomegaly  Ext: No edema, pulses 2+ Skin: Warm and dry, no rashes     Relevant CV Studies: Trop/BNP: 44/51/65/70 TTE 03/2022: 55%, GIIDD, aortic root 4.0 cm  Assessment and Plan:  Darryl Ward is a 78 y.o. male with a hx of PAD s/p bilateral aortofemoral bypass graft, CAD s/p D1 PCI (2017), pAF, HTN, HFpEF who is being seen today for the evaluation of Afib with RVR at the request of Dr Cheryle.  #Afib with RVR #AKI #CAD s/p D1 PCI #PAD s/p bilateral aortofemoral bypass graft #HFpEF - Presenting with GI illness and found to be in Afib with RVR, now on dilt gtt. Currently in rate controlled afib. Will discontinue dilt gtt and uptitrate PO metop 12.5 mg Q6H. Takes 25 mg XL at home. - Cont Eliquis  and lipitor  - Will need vascular follow up for AAA 4.4 cm on CT and Hx of aortofemoral bypass - We will continue to follow  For questions or updates, please contact [facility] Please consult [email or web address] for contact info under    Signed, Joelle DEL.  Ren Ny, MD, [facility] [place]  [facility]  12/08/2023 1:00 PM

## 2023-12-08 NOTE — Evaluation (Signed)
 Physical Therapy Evaluation Patient Details Name: Darryl Ward MRN: [id number] DOB: 30-Jul-1945 Today's Date: 12/08/2023  History of Present Illness  Darryl Ward is a 78 y.o. year old male presenting to the ED 12/07/23 with malaise and fatigue after GI symptoms, in A fib with RVR. PMH: hypertension, hyperlipidemia complicated by CAD and PAD, paroxysmal A-fib, CHF , COPD and history of lung cancer, R  ORIF for periprosthetic  femur facture.  Clinical Impression  Pt admitted with above diagnosis.  Pt currently with functional limitations due to the deficits listed below (see PT Problem List). Pt will benefit from acute skilled PT to increase their independence and safety with mobility to allow discharge.  .    The patient reports having been in rehab for fractured hip, now has HHPT. Patient reports ambulatory with RW.  Patient assisted to  stand and step top recliner using a RW. HR105, BP after standing 100/65. SPo2 985 on RA.  Patient should progress to return home with family support and HHPT continuation.      If plan is discharge home, recommend the following: A little help with walking and/or transfers;A little help with bathing/dressing/bathroom;Direct supervision/assist for financial management;Assist for transportation;Help with stairs or ramp for entrance   Can travel by private vehicle        Equipment Recommendations None recommended by PT  Recommendations for Other Services       Functional Status Assessment Patient has had a recent decline in their functional status and demonstrates the ability to make significant improvements in function in a reasonable and predictable amount of time.     Precautions / Restrictions Precautions Precautions: Fall Restrictions Weight Bearing Restrictions Per Provider Order: No      Mobility  Bed Mobility Overal bed mobility: Needs Assistance Bed Mobility: Supine to Sit     Supine to sit: Min assist, HOB elevated, Used rails      General bed mobility comments: assistance to move to sitting upright    Transfers Overall transfer level: Needs assistance Equipment used: Rolling walker (2 wheels) Transfers: Sit to/from Stand Sit to Stand: Min assist           General transfer comment: steady assist to  stand, Appears right foot not flat, Step to recliner, antalgic on RLE    Ambulation/Gait                  Stairs            Wheelchair Mobility     Tilt Bed    Modified Rankin (Stroke Patients Only)       Balance Overall balance assessment: History of Falls, Needs assistance Sitting-balance support: Feet supported, No upper extremity supported Sitting balance-Leahy Scale: Fair     Standing balance support: Reliant on assistive device for balance, During functional activity, Bilateral upper extremity supported Standing balance-Leahy Scale: Poor                               Pertinent Vitals/Pain Pain Assessment Pain Assessment: Faces Faces Pain Scale: Hurts a little bit Pain Location: right hip Pain Descriptors / Indicators: Discomfort Pain Intervention(s): Monitored during session    Home Living Family/patient expects to be discharged to:: Private residence Living Arrangements: Spouse/significant other Available Help at Discharge: Available 24 hours/day;Other (Comment) Type of Home: House Home Access: Ramped entrance       Home Layout: One level Home Equipment: [employer] (2 wheels);Shower seat;Cane -  quad;BSC/3in1 Additional Comments: Darryl Ward roommate of 40 years is who the pt has at home for support. patient reports that he has been in rehab x 5 months, has HHPT 2 x week    Prior Function Prior Level of Function : Needs assist             Mobility Comments: uses RW       Extremity/Trunk Assessment   Upper Extremity Assessment Upper Extremity Assessment: Overall WFL for tasks assessed    Lower Extremity Assessment Lower Extremity  Assessment: RLE deficits/detail RLE Deficits / Details: leg appears more adducted ,    Cervical / Trunk Assessment Cervical / Trunk Assessment: Normal  Communication   Communication Communication: Impaired Factors Affecting Communication: Hearing impaired    Cognition Arousal: Alert Behavior During Therapy: WFL for tasks assessed/performed   PT - Cognitive impairments: Orientation   Orientation impairments: Time                     Following commands: Intact       Cueing Cueing Techniques: Verbal cues     General Comments      Exercises     Assessment/Plan    PT Assessment Patient needs continued PT services  PT Problem List Decreased strength;Decreased range of motion;Decreased activity tolerance;Decreased balance;Decreased mobility;Decreased cognition;Decreased knowledge of use of DME;Decreased safety awareness       PT Treatment Interventions DME instruction;Gait training;Functional mobility training;Therapeutic activities;Therapeutic exercise;Patient/family education    PT Goals (Current goals can be found in the Care Plan section)  Acute Rehab PT Goals Patient Stated Goal: go home PT Goal Formulation: With patient Time For Goal Achievement: 12/22/23 Potential to Achieve Goals: Good    Frequency Min 3X/week     Co-evaluation               AM-PAC PT 6 Clicks Mobility  Outcome Measure Help needed turning from your back to your side while in a flat bed without using bedrails?: A Little Help needed moving from lying on your back to sitting on the side of a flat bed without using bedrails?: A Little Help needed moving to and from a bed to a chair (including a wheelchair)?: A Little Help needed standing up from a chair using your arms (e.g., wheelchair or bedside chair)?: A Little Help needed to walk in hospital room?: Total Help needed climbing 3-5 steps with a railing? : Total 6 Click Score: 14    End of Session Equipment Utilized  During Treatment: Gait belt Activity Tolerance: Patient tolerated treatment well Patient left: in chair;with call bell/phone within reach;with chair alarm set Nurse Communication: Mobility status PT Visit Diagnosis: Unsteadiness on feet (R26.81);History of falling (Z91.81)    Time: 9058-8987 PT Time Calculation (min) (ACUTE ONLY): 31 min   Charges:   PT Evaluation $PT Eval Low Complexity: 1 Low PT Treatments $Therapeutic Activity: 8-22 mins PT General Charges $$ ACUTE PT VISIT: 1 Visit         Darice Potters PT Acute Rehabilitation Services Office [phone]   Potters Darice Norris 12/08/2023, 12:30 PM

## 2023-12-08 NOTE — Progress Notes (Signed)
 PROGRESS NOTE    Darryl Ward  FMW:[id number] DOB: Feb 18, 1945 DOA: 12/07/2023 PCP: Cook, Jayce G, DO   Brief Narrative:  78 y.o. year old male with medical history of hypertension, hyperlipidemia, CAD, PAD, paroxysmal A-fib, chronic diastolic CHF, COPD and history of lung cancer presented with malaise and fatigue after GI symptoms (nausea/vomiting/diarrhea).  On presentation, WBC was 12.9, creatinine was 1.5, lactic acid of 2.8 but repeat was normal, UA was normal.  CT of abdomen and pelvis showed no acute findings but she did show abdominal aortic aneurysm at 4.4 cm.  Patient was found to be in A-fib with RVR and had to be started on Cardizem  drip.  Assessment & Plan:   Paroxysmal A-fib with RVR - Possibly exacerbated by recent GI illness.  Currently on Cardizem  drip.  Continue metoprolol  succinate and apixaban .  Consult cardiology.  Acute gastroenteritis -Resolving per patient.  Diet as tolerated  AKI - Improving with IV fluids.  Monitor  Hypertension Hyperlipidemia - Continue statin, metoprolol  succinate  BPH -Continue tamsulosin   GERD -continue PPI  Depression - Continue escitalopram   COPD - Stable.  Continue current inhaled regimen  History of lung cancer - Apparently in remission.  Outpatient follow-up with oncology  Lactic acidosis - Resolved with IV fluids.  Abdominal aortic aneurysm -noted to be 4.4 cm on imaging.  Outpatient follow-up with vascular surgery.  Repeat imaging recommended at 12 months  Physical deconditioning - PT eval   DVT prophylaxis: Eliquis  Code Status: DNR Family Communication: None at bedside Disposition Plan: Status is: Inpatient Remains inpatient appropriate because: Of severity of illness    Consultants: Consult cardiology  Procedures: None  Antimicrobials: None   Subjective: Patient seen and examined at bedside.  Feels slightly better.  Denies any current chest pain, palpitation, nausea or  vomiting.  Objective: Vitals:   12/08/23 0400 12/08/23 0423 12/08/23 0500 12/08/23 0600  BP: (!) 99/41  (!) 100/57   Pulse: (!) 108  83 (!) 109  Resp: 15  15 14   Temp:  98.9 F (37.2 C)    TempSrc:  Oral    SpO2: 96%  97% 97%  Weight:   82.1 kg   Height:        Intake/Output Summary (Last 24 hours) at 12/08/2023 9367 Last data filed at 12/08/2023 0313 Gross per 24 hour  Intake 3974.62 ml  Output --  Net 3974.62 ml   Filed Weights   12/07/23 2100 12/08/23 0500  Weight: 76.2 kg 82.1 kg    Examination:  General exam: Appears calm and comfortable  Respiratory system: Bilateral decreased breath sounds at bases Cardiovascular system: S1 & S2 heard, Rate controlled Gastrointestinal system: Abdomen is nondistended, soft and nontender. Normal bowel sounds heard. Extremities: No cyanosis, clubbing, edema  Central nervous system: Alert and oriented.  Slow to respond.  No focal neurological deficits. Moving extremities Skin: No rashes, lesions or ulcers Psychiatry: Flat affect.  Not agitated.   Data Reviewed: I have personally reviewed following labs and imaging studies  CBC: Recent Labs  Lab 12/07/23 1551 12/08/23 0400  WBC 12.9* 10.2  HGB 13.9 11.2*  HCT 40.0 32.8*  MCV 92.8 93.4  PLT 173 153   Basic Metabolic Panel: Recent Labs  Lab 12/07/23 1551 12/08/23 0400  NA 139 138  K 3.4* 3.1*  CL 98 102  CO2 25 24  GLUCOSE 120* 99  BUN 31* 28*  CREATININE 1.50* 1.13  CALCIUM  10.2 8.9  MG 2.1  --    GFR: Estimated Creatinine  Clearance: 57.4 mL/min (by C-G formula based on SCr of 1.13 mg/dL). Liver Function Tests: Recent Labs  Lab 12/07/23 1551  AST 24  ALT 9  ALKPHOS 78  BILITOT 1.0  PROT 7.4  ALBUMIN  4.0   Recent Labs  Lab 12/07/23 1551  LIPASE 12   No results for input(s): AMMONIA in the last 168 hours. Coagulation Profile: No results for input(s): INR, PROTIME in the last 168 hours. Cardiac Enzymes: Recent Labs  Lab 12/07/23 1551   CKTOTAL 218   BNP (last 3 results) Recent Labs    12/07/23 1551  PROBNP 617.0*   HbA1C: No results for input(s): HGBA1C in the last 72 hours. CBG: No results for input(s): GLUCAP in the last 168 hours. Lipid Profile: No results for input(s): CHOL, HDL, LDLCALC, TRIG, CHOLHDL, LDLDIRECT in the last 72 hours. Thyroid  Function Tests: No results for input(s): TSH, T4TOTAL, FREET4, T3FREE, THYROIDAB in the last 72 hours. Anemia Panel: No results for input(s): VITAMINB12, FOLATE, FERRITIN, TIBC, IRON, RETICCTPCT in the last 72 hours. Sepsis Labs: Recent Labs  Lab 12/07/23 1914 12/07/23 2148 12/08/23 0359 12/08/23 0542  LATICACIDVEN 1.7 2.3* 1.4 1.3    Recent Results (from the past 240 hours)  Resp panel by RT-PCR (RSV, Flu A&B, Covid) Anterior Nasal Swab     Status: None   Collection Time: 12/07/23  3:43 PM   Specimen: Anterior Nasal Swab  Result Value Ref Range Status   SARS Coronavirus 2 by RT PCR NEGATIVE NEGATIVE Final    Comment: (NOTE) SARS-CoV-2 target nucleic acids are NOT DETECTED.  The SARS-CoV-2 RNA is generally detectable in upper respiratory specimens during the acute phase of infection. The lowest concentration of SARS-CoV-2 viral copies this assay can detect is 138 copies/mL. A negative result does not preclude SARS-Cov-2 infection and should not be used as the sole basis for treatment or other patient management decisions. A negative result may occur with  improper specimen collection/handling, submission of specimen other than nasopharyngeal swab, presence of viral mutation(s) within the areas targeted by this assay, and inadequate number of viral copies(<138 copies/mL). A negative result must be combined with clinical observations, patient history, and epidemiological information. The expected result is Negative.  Fact Sheet for Patients:  bloggercourse.com  Fact Sheet for Healthcare  Providers:  seriousbroker.it  This test is no t yet approved or cleared by the [place]  FDA and  has been authorized for detection and/or diagnosis of SARS-CoV-2 by FDA under an Emergency Use Authorization (EUA). This EUA will remain  in effect (meaning this test can be used) for the duration of the COVID-19 declaration under Section 564(b)(1) of the Act, 21 U.S.C.section 360bbb-3(b)(1), unless the authorization is terminated  or revoked sooner.       Influenza A by PCR NEGATIVE NEGATIVE Final   Influenza B by PCR NEGATIVE NEGATIVE Final    Comment: (NOTE) The Xpert Xpress SARS-CoV-2/FLU/RSV plus assay is intended as an aid in the diagnosis of influenza from Nasopharyngeal swab specimens and should not be used as a sole basis for treatment. Nasal washings and aspirates are unacceptable for Xpert Xpress SARS-CoV-2/FLU/RSV testing.  Fact Sheet for Patients: bloggercourse.com  Fact Sheet for Healthcare Providers: seriousbroker.it  This test is not yet approved or cleared by the [place]  FDA and has been authorized for detection and/or diagnosis of SARS-CoV-2 by FDA under an Emergency Use Authorization (EUA). This EUA will remain in effect (meaning this test can be used) for the duration of the COVID-19 declaration under  Section 564(b)(1) of the Act, 21 U.S.C. section 360bbb-3(b)(1), unless the authorization is terminated or revoked.     Resp Syncytial Virus by PCR NEGATIVE NEGATIVE Final    Comment: (NOTE) Fact Sheet for Patients: bloggercourse.com  Fact Sheet for Healthcare Providers: seriousbroker.it  This test is not yet approved or cleared by the [place]  FDA and has been authorized for detection and/or diagnosis of SARS-CoV-2 by FDA under an Emergency Use Authorization (EUA). This EUA will remain in effect (meaning this test can be  used) for the duration of the COVID-19 declaration under Section 564(b)(1) of the Act, 21 U.S.C. section 360bbb-3(b)(1), unless the authorization is terminated or revoked.  Performed at [facility], [address], [place], [place] [address]   MRSA Next Gen by PCR, Nasal     Status: None   Collection Time: 12/07/23  9:09 PM   Specimen: Nasal Mucosa; Nasal Swab  Result Value Ref Range Status   MRSA by PCR Next Gen NOT DETECTED NOT DETECTED Final    Comment: (NOTE) The GeneXpert MRSA Assay (FDA approved for NASAL specimens only), is one component of a comprehensive MRSA colonization surveillance program. It is not intended to diagnose MRSA infection nor to guide or monitor treatment for MRSA infections. Test performance is not FDA approved in patients less than 39 years old. Performed at [facility], [address], [place], [place] [address]          Radiology Studies: CT ABDOMEN PELVIS WO CONTRAST Result Date: 12/07/2023 CLINICAL DATA:  Abdominal pain and vomiting 3 days. History of lung cancer. EXAM: CT ABDOMEN AND PELVIS WITHOUT CONTRAST TECHNIQUE: Multidetector CT imaging of the abdomen and pelvis was performed following the standard protocol without IV contrast. RADIATION DOSE REDUCTION: This exam was performed according to the departmental dose-optimization program which includes automated exposure control, adjustment of the mA and/or kV according to patient size and/or use of iterative reconstruction technique. COMPARISON:  CT chest 09/10/2023 and PET-CT 06/22/2021 FINDINGS: Lower chest: Heart is normal size. Calcified plaque over the left main and 3 vessel coronary arteries. Calcified plaque over the descending thoracic aorta. Visualized lung bases demonstrate no acute findings. Thin walled cyst over the left lower lobe unchanged. Minimal wall thickening of the distal esophagus which may be due to reflux esophagitis. Hepatobiliary: Mild  cholelithiasis. Liver and biliary tree are normal. Pancreas: Normal. Spleen: Normal Adrenals/Urinary Tract: Adrenal glands are normal. Kidneys are normal in size. There is no hydronephrosis. Multiple calcifications over the kidneys most of which are likely vascular. Few small bilateral renal cortical hypodensities without significant change likely cysts. Ureters and bladder are normal. Stomach/Bowel: Stomach and small bowel are normal. Appendix is normal. Colon is unremarkable. 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 Just below the SMA, the aorta measures 3.9 x 4 cm without significant change. Remaining vascular structures are unremarkable. No adenopathy. Reproductive: Prostate is unremarkable. Other: No free fluid or focal inflammatory change. Small midline epigastric ventral hernia containing only peritoneal fat. Musculoskeletal: Right hip arthroplasty intact. Posterior fusion hardware intact at the L5-S1 level. No focal bony abnormality or acute process. IMPRESSION: 1. No acute findings in the abdomen/pelvis. 2. Mild cholelithiasis. 3. Bilateral aortofemoral bypass graft unchanged. 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 Just  below the SMA, the aorta measures 3.9 x 4 cm without significant change. Recommend vascular consultation if not done previously as well as surveillance with follow-up CT 12 months. 4. Mild uniform wall thickening of the distal esophagus which may be seen with reflux esophagitis. 5. Small midline epigastric ventral hernia containing only peritoneal fat. 6. Aortic atherosclerosis. Atherosclerotic coronary artery disease. Aortic Atherosclerosis (ICD10-I70.0). Electronically Signed   By: Toribio Agreste M.D.    On: 12/07/2023 17:47        Scheduled Meds:  apixaban   5 mg Oral BID   atorvastatin   80 mg Oral Daily   Chlorhexidine  Gluconate Cloth  6 each Topical QHS   cholecalciferol   2,000 Units Oral Daily   escitalopram   10 mg Oral Daily   feeding supplement  237 mL Oral BID BM   metoprolol  succinate  25 mg Oral Daily   pantoprazole   40 mg Oral Daily   sodium chloride  flush  3 mL Intravenous Q12H   tamsulosin   0.4 mg Oral Daily   umeclidinium bromide   1 puff Inhalation Daily   Continuous Infusions:  diltiazem  (CARDIZEM ) infusion 7.5 mg/hr (12/08/23 0313)   lactated ringers  150 mL/hr at 12/08/23 0313          Sophie Mao, MD [facility] 12/08/2023, 6:32 AM

## 2023-12-09 DIAGNOSIS — I2510 Atherosclerotic heart disease of native coronary artery without angina pectoris: Secondary | ICD-10-CM | POA: Diagnosis not present

## 2023-12-09 DIAGNOSIS — I5030 Unspecified diastolic (congestive) heart failure: Secondary | ICD-10-CM | POA: Diagnosis not present

## 2023-12-09 DIAGNOSIS — I739 Peripheral vascular disease, unspecified: Secondary | ICD-10-CM | POA: Diagnosis not present

## 2023-12-09 DIAGNOSIS — I4891 Unspecified atrial fibrillation: Secondary | ICD-10-CM | POA: Diagnosis not present

## 2023-12-09 LAB — CBC WITH DIFFERENTIAL/PLATELET
Abs Immature Granulocytes: 0.03 K/uL (ref 0.00–0.07)
Basophils Absolute: 0 K/uL (ref 0.0–0.1)
Basophils Relative: 0 %
Eosinophils Absolute: 0.2 K/uL (ref 0.0–0.5)
Eosinophils Relative: 2 %
HCT: 34.1 % — ABNORMAL LOW (ref 39.0–52.0)
Hemoglobin: 11.5 g/dL — ABNORMAL LOW (ref 13.0–17.0)
Immature Granulocytes: 0 %
Lymphocytes Relative: 17 %
Lymphs Abs: 1.5 K/uL (ref 0.7–4.0)
MCH: 31.9 pg (ref 26.0–34.0)
MCHC: 33.7 g/dL (ref 30.0–36.0)
MCV: 94.5 fL (ref 80.0–100.0)
Monocytes Absolute: 0.8 K/uL (ref 0.1–1.0)
Monocytes Relative: 8 %
Neutro Abs: 6.5 K/uL (ref 1.7–7.7)
Neutrophils Relative %: 73 %
Platelets: 148 K/uL — ABNORMAL LOW (ref 150–400)
RBC: 3.61 MIL/uL — ABNORMAL LOW (ref 4.22–5.81)
RDW: 14.1 % (ref 11.5–15.5)
WBC: 9 K/uL (ref 4.0–10.5)
nRBC: 0 % (ref 0.0–0.2)

## 2023-12-09 LAB — BASIC METABOLIC PANEL WITH GFR
Anion gap: 8 (ref 5–15)
BUN: 22 mg/dL (ref 8–23)
CO2: 25 mmol/L (ref 22–32)
Calcium: 9 mg/dL (ref 8.9–10.3)
Chloride: 103 mmol/L (ref 98–111)
Creatinine, Ser: 0.99 mg/dL (ref 0.61–1.24)
GFR, Estimated: >60 mL/min (ref >60)
Glucose, Bld: 92 mg/dL (ref 70–99)
Potassium: 3.9 mmol/L (ref 3.5–5.1)
Sodium: 136 mmol/L (ref 135–145)

## 2023-12-09 LAB — GLUCOSE, CAPILLARY: Glucose-Capillary: 88 mg/dL (ref 70–99)

## 2023-12-09 LAB — MAGNESIUM: Magnesium: 1.5 mg/dL — ABNORMAL LOW (ref 1.7–2.4)

## 2023-12-09 MED ORDER — METOPROLOL TARTRATE 25 MG PO TABS: 25.0000 mg | ORAL_TABLET | Freq: Three times a day (TID) | ORAL | Status: DC

## 2023-12-09 MED ORDER — METOPROLOL TARTRATE 25 MG PO TABS
25.0000 mg | ORAL_TABLET | Freq: Two times a day (BID) | ORAL | Status: DC
  Administered 2023-12-09 – 2023-12-11 (×5): 25 mg via ORAL
  Filled 2023-12-09 (×5): qty 1

## 2023-12-09 MED ORDER — SODIUM CHLORIDE 0.9 % IV BOLUS
1000.0000 mL | Freq: Once | INTRAVENOUS | Status: AC
  Administered 2023-12-09: 1000 mL via INTRAVENOUS

## 2023-12-09 MED ORDER — MAGNESIUM SULFATE 2 GM/50ML IV SOLN
2.0000 g | Freq: Once | INTRAVENOUS | Status: AC
  Administered 2023-12-09: 2 g via INTRAVENOUS
  Filled 2023-12-09: qty 50

## 2023-12-09 MED ORDER — MAGNESIUM OXIDE -MG SUPPLEMENT 400 (240 MG) MG PO TABS
400.0000 mg | ORAL_TABLET | Freq: Every day | ORAL | Status: DC
  Administered 2023-12-09 – 2023-12-13 (×5): 400 mg via ORAL
  Filled 2023-12-09 (×5): qty 1

## 2023-12-09 MED ORDER — SODIUM CHLORIDE 0.9 % IV SOLN: INTRAVENOUS | Status: DC

## 2023-12-09 NOTE — TOC Initial Note (Signed)
 Transition of Care [facility]) - Initial/Assessment Note    Patient Details  Name: Darryl Ward MRN: [id number] Date of Birth: 1945-10-02  Transition of Care [facility]) CM/SW Contact:    Jon ONEIDA Anon, RN Phone Number: 12/09/2023, 3:57 PM  Clinical Narrative:                 Pt is from home. RNCM met with pt at beside and introduced role in DC planning. Discussed PT rec for [facility] PT and pt states he was receiving HH PT with [facility] PTA. [facility] accepted in the HUB. Will need ROC orders at DC. Pt uses RW for ambulation at baseline. Pt states spouse will provide transportation at DC. IP CM will continue to follow for any DC needs.    Expected Discharge Plan: Home w Home Health Services Barriers to Discharge: Continued Medical Work up   Patient Goals and CMS Choice Patient states their goals for this hospitalization and ongoing recovery are:: To return home with [facility] services CMS Medicare.gov Compare Post Acute Care list provided to:: Patient Choice offered to / list presented to : Patient [place] ownership interest in [facility].provided to:: Patient    Expected Discharge Plan and Services In-house Referral: NA Discharge Planning Services: CM Consult Post Acute Care Choice: Durable Medical Equipment, Home Health Living arrangements for the past 2 months: Single Family Home                 DME Arranged: N/A DME Agency: NA       HH Arranged: PT HH Agency: [facility] Date [facility] Agency Contacted: 12/09/23 Time HH Agency Contacted: 1555 Representative spoke with at [facility] Agency: Accepted in the HUB  Prior Living Arrangements/Services Living arrangements for the past 2 months: Single Family Home Lives with:: Spouse Patient language and need for interpreter reviewed:: Yes Do you feel safe going back to the place where you live?: Yes      Need for Family Participation in Patient Care: Yes (Comment) Care giver support system in place?: Yes (comment) Current home  services: DME, Home PT (RW) Criminal Activity/Legal Involvement Pertinent to Current Situation/Hospitalization: No - Comment as needed  Activities of Daily Living   ADL Screening (condition at time of admission) Independently performs ADLs?: Yes (appropriate for developmental age) Is the patient deaf or have difficulty hearing?: No Does the patient have difficulty seeing, even when wearing glasses/contacts?: No Does the patient have difficulty concentrating, remembering, or making decisions?: No  Permission Sought/Granted Permission sought to share information with : Family Supports, Oceanographer granted to share information with : Yes, Verbal Permission Granted  Share Information with NAME: Smith,Terry (Spouse)  [phone]  Permission granted to share info w AGENCY: [facility]        Emotional Assessment Appearance:: Appears stated age Attitude/Demeanor/Rapport: Engaged Affect (typically observed): Accepting, Appropriate Orientation: : Oriented to Self, Oriented to Place, Oriented to  Time, Oriented to Situation Alcohol / Substance Use: Not Applicable Psych Involvement: No (comment)  Admission diagnosis:  Rapid atrial fibrillation (HCC) [I48.91] Atrial fibrillation with rapid ventricular response [facility]) [I48.91] Patient Active Problem List   Diagnosis Date Noted   Atrial fibrillation with rapid ventricular response (HCC) 12/07/2023   History of hip fracture 09/25/2023   Voice hoarseness 06/23/2023   Malnutrition of moderate degree 05/15/2023   Urinary incontinence 03/31/2023   Recurrent falls 03/11/2023   Paroxysmal atrial fibrillation (HCC) 03/11/2023   GERD without esophagitis 03/11/2023   Depression 03/11/2023  Benign prostatic hyperplasia with lower urinary tract symptoms 06/13/2022   Normocytic anemia 04/28/2022   Iron deficiency anemia 04/22/2022   History of stroke 03/27/2022   Chronic combined systolic and diastolic CHF (congestive  heart failure) (HCC) 03/27/2022   Essential hypertension 09/30/2021   Primary adenocarcinoma of upper lobe of right lung (HCC) 06/26/2021   COPD (chronic obstructive pulmonary disease) (HCC) 05/29/2021   Meningioma, cerebral (HCC) 02/05/2020   Chronic midline low back pain without sciatica 04/27/2019   Tobacco abuse 11/17/2018   PVD (peripheral vascular disease) (HCC) 05/21/2012   Mixed hyperlipidemia 12/13/2011   Coronary atherosclerosis of native coronary artery 12/13/2011   PCP:  Cook, Jayce G, DO Pharmacy:   [facility] - [address], [place] - [address] [place] [address] Phone: [phone] Fax: [phone]  [facility] - [facility] [address] [place] [address] Phone: [phone] Fax: [phone]  [facility] - [place], [place] - [address] [place] [address] Phone: [phone] Fax: [phone]  [facility] [address], [place] - [address] [place] [address] [place] [address] [place] [address] Phone: [phone] Fax: [phone]     Social Drivers of Health (SDOH) Social History: SDOH Screenings   Food Insecurity: No Food Insecurity (12/07/2023)  Housing: Low Risk  (12/07/2023)  Transportation Needs: No Transportation Needs (12/07/2023)  Utilities: Not At Risk (12/07/2023)  Alcohol Screen: Low Risk  (08/30/2022)  Depression (PHQ2-9): Medium Risk (11/28/2023)  Financial Resource Strain: High Risk (08/30/2022)  Physical Activity: Sufficiently Active (11/28/2023)  Social Connections: Moderately Isolated (12/07/2023)  Stress: No Stress Concern Present (11/28/2023)  Tobacco Use: Medium Risk (12/07/2023)  Health Literacy: Adequate Health Literacy (11/28/2023)   SDOH Interventions:     Readmission Risk Interventions    12/09/2023    3:52 PM 03/13/2023   11:54 AM  Readmission Risk Prevention Plan  Transportation Screening Complete Complete  PCP or Specialist  Appt within 5-7 Days Complete   Home Care Screening Complete   Medication Review (RN CM) Complete   HRI or Home Care Consult  Complete  Social Work Consult for Recovery Care Planning/Counseling  Complete  Palliative Care Screening  Not Applicable  Medication Review Oceanographer)  Complete

## 2023-12-09 NOTE — Plan of Care (Signed)
  Problem: Education: Goal: Knowledge of General Education information will improve Description: Including pain rating scale, medication(s)/side effects and non-pharmacologic comfort measures Outcome: Progressing   Problem: Health Behavior/Discharge Planning: Goal: Ability to manage health-related needs will improve Outcome: Progressing   Problem: Clinical Measurements: Goal: Ability to maintain clinical measurements within normal limits will improve Outcome: Progressing   Problem: Elimination: Goal: Will not experience complications related to bowel motility Outcome: Progressing Goal: Will not experience complications related to urinary retention Outcome: Progressing   Problem: Pain Managment: Goal: General experience of comfort will improve and/or be controlled Outcome: Progressing   Problem: Safety: Goal: Ability to remain free from injury will improve Outcome: Progressing

## 2023-12-09 NOTE — Progress Notes (Signed)
  Progress Note  Patient Name: Darryl Ward Date of Encounter: 12/09/2023 [facility] Cardiologist: [name], MD   Interval Summary   He denies any chest palpitations and dyspnea, and has had improvement in PO intake  Vital Signs Vitals:   12/09/23 0715 12/09/23 0730 12/09/23 0745 12/09/23 0800  BP: 104/76 109/75 124/64 107/66  Pulse: 72 (!) 49 87 65  Resp: (!) 22 14 10 20   Temp:      TempSrc:      SpO2: 95% 93% 99% 95%  Weight:      Height:        Intake/Output Summary (Last 24 hours) at 12/09/2023 0806 Last data filed at 12/09/2023 0700 Gross per 24 hour  Intake 3564.08 ml  Output 1100 ml  Net 2464.08 ml      12/09/2023    5:00 AM 12/08/2023    5:00 AM 12/07/2023    9:00 PM  Last 3 Weights  Weight (lbs) 183 lb 3.2 oz 181 lb 168 lb  Weight (kg) 83.1 kg 82.101 kg 76.204 kg      Telemetry/ECG  Afib 110s-120s - Personally Reviewed  Physical Exam  GEN: No acute distress.   Neck: No JVD Cardiac: irregularly irregular and tachycardic, no murmurs, rubs, or gallops.  Respiratory: Clear to auscultation bilaterally. GI: Soft, nontender, non-distended  MS: No edema  Assessment & Plan  Darryl Ward is a 78 y.o. male with a hx of PAD s/p bilateral aortofemoral bypass graft, CAD s/p D1 PCI (2017), pAF, HTN, HFpEF who is being seen today for the evaluation of Afib with RVR in setting of GI illness at the request of Dr Cheryle. Discontinued dilt gtt on 11/17 and started metop 12.5 mg Q6H. Takes metop XL 25 mg at home.    #Afib with RVR #AKI #CAD s/p D1 PCI #PAD s/p bilateral aortofemoral bypass graft #HFpEF - Increase metop to 25 mg BID - NPO at midnight for possible DCCV - Would recommend discontinuing LR given risk of volume overload and patient improved PO intake - Cont Eliquis  and lipitor  - Will need vascular follow up for AAA 4.4 cm on CT and Hx of aortofemoral bypass - We will continue to follow  For questions or updates, please contact  [facility] Please consult [email or web address] for contact info under   Signed, Joelle VEAR Ren Donley, MD

## 2023-12-09 NOTE — Progress Notes (Addendum)
 PROGRESS NOTE    Darryl Ward  FMW:[id number] DOB: 1945/03/07 DOA: 12/07/2023 PCP: Cook, Jayce G, DO   Brief Narrative:  78 y.o. year old male with medical history of hypertension, hyperlipidemia, CAD, PAD, paroxysmal A-fib, chronic diastolic CHF, COPD and history of lung cancer presented with malaise and fatigue after GI symptoms (nausea/vomiting/diarrhea).  On presentation, WBC was 12.9, creatinine was 1.5, lactic acid of 2.8 but repeat was normal, UA was normal.  CT of abdomen and pelvis showed no acute findings but she did show abdominal aortic aneurysm at 4.4 cm.  Patient was found to be in A-fib with RVR and had to be started on Cardizem  drip.  Cardiology was consulted.  Assessment & Plan:   Paroxysmal A-fib with RVR - Possibly exacerbated by recent GI illness.  Initially started on Cardizem  drip.  Subsequently, Cardizem  drip was discontinued by cardiology.  Currently on oral metoprolol .  Has intermittent tachycardia and bradycardia.  Continue apixaban .  Follow further cardiology  recommendations.  Acute gastroenteritis with nausea, vomiting, diarrhea - Patient was vomiting yesterday.  Improving subsequently.  Continue antiemetics as needed.  Diarrhea improving.  Continue diet as tolerated  AKI - Resolved with IV fluids.  Off IV fluids  Thrombocytopenia - Question cause.  No signs of bleeding.  Monitor intermittently  Hypokalemia - Resolved  Hypomagnesemia - Replace.  Repeat a.m. labs  Anemia of chronic disease - From chronic illnesses.  Hemoglobin stable.  Monitor intermittently  Hypertension Hyperlipidemia - Continue statin, metoprolol  succinate  BPH -Continue tamsulosin   GERD -continue PPI  Depression - Continue escitalopram   COPD - Stable.  Continue current inhaled regimen  History of lung cancer - Apparently in remission.  Outpatient follow-up with oncology  Lactic acidosis - Resolved with IV fluids.  Abdominal aortic aneurysm -noted to be 4.4 cm  on imaging.  Outpatient follow-up with vascular surgery.  Repeat imaging recommended at 12 months  Physical deconditioning - PT recommended home health PT   DVT prophylaxis: Eliquis  Code Status: DNR Family Communication: None at bedside Disposition Plan: Status is: Inpatient Remains inpatient appropriate because: Of severity of illness    Consultants:  cardiology  Procedures: None  Antimicrobials: None   Subjective: Patient seen and examined at bedside.  Diarrhea is improving.  Has not vomited since yesterday.  No fever, chest pain or shortness of breath reported. Objective: Vitals:   12/09/23 0915 12/09/23 0930 12/09/23 0945 12/09/23 1021  BP: 104/80 102/67 126/85   Pulse: (!) 28 (!) 107 (!) 44 (!) 57  Resp: 12 17 13 13   Temp:      TempSrc:      SpO2: 92% (!) 89% 99% 98%  Weight:      Height:        Intake/Output Summary (Last 24 hours) at 12/09/2023 1047 Last data filed at 12/09/2023 1024 Gross per 24 hour  Intake 3797.78 ml  Output 1450 ml  Net 2347.78 ml   Filed Weights   12/07/23 2100 12/08/23 0500 12/09/23 0500  Weight: 76.2 kg 82.1 kg 83.1 kg    Examination:  General: On room air.  No distress ENT/neck: No thyromegaly.  JVD is not elevated  respiratory: Decreased breath sounds at bases bilaterally with some crackles; no wheezing  CVS: S1-S2 heard, intermittent tachycardia and bradycardia present Abdominal: Soft, nontender, slightly distended; no organomegaly, bowel sounds are heard Extremities: Trace lower extremity edema; no cyanosis  CNS: Awake and alert.  Remains slow to respond.  No focal neurologic deficit.  Moves extremities Lymph: No  obvious lymphadenopathy Skin: No obvious ecchymosis/lesions  psych: Affect is mostly flat.  No signs of agitation.   Musculoskeletal: No obvious joint swelling/deformity    Data Reviewed: I have personally reviewed following labs and imaging studies  CBC: Recent Labs  Lab 12/07/23 1551 12/08/23 0400  12/09/23 0407  WBC 12.9* 10.2 9.0  NEUTROABS  --   --  6.5  HGB 13.9 11.2* 11.5*  HCT 40.0 32.8* 34.1*  MCV 92.8 93.4 94.5  PLT 173 153 148*   Basic Metabolic Panel: Recent Labs  Lab 12/07/23 1551 12/08/23 0400 12/08/23 1214 12/09/23 0407  NA 139 138  --  136  K 3.4* 3.1*  --  3.9  CL 98 102  --  103  CO2 25 24  --  25  GLUCOSE 120* 99  --  92  BUN 31* 28*  --  22  CREATININE 1.50* 1.13  --  0.99  CALCIUM  10.2 8.9  --  9.0  MG 2.1  --  1.6* 1.5*   GFR: Estimated Creatinine Clearance: 65.5 mL/min (by C-G formula based on SCr of 0.99 mg/dL). Liver Function Tests: Recent Labs  Lab 12/07/23 1551  AST 24  ALT 9  ALKPHOS 78  BILITOT 1.0  PROT 7.4  ALBUMIN  4.0   Recent Labs  Lab 12/07/23 1551  LIPASE 12   No results for input(s): AMMONIA in the last 168 hours. Coagulation Profile: No results for input(s): INR, PROTIME in the last 168 hours. Cardiac Enzymes: Recent Labs  Lab 12/07/23 1551  CKTOTAL 218   BNP (last 3 results) Recent Labs    12/07/23 1551  PROBNP 617.0*   HbA1C: No results for input(s): HGBA1C in the last 72 hours. CBG: Recent Labs  Lab 12/09/23 0832  GLUCAP 88   Lipid Profile: No results for input(s): CHOL, HDL, LDLCALC, TRIG, CHOLHDL, LDLDIRECT in the last 72 hours. Thyroid  Function Tests: No results for input(s): TSH, T4TOTAL, FREET4, T3FREE, THYROIDAB in the last 72 hours. Anemia Panel: No results for input(s): VITAMINB12, FOLATE, FERRITIN, TIBC, IRON, RETICCTPCT in the last 72 hours. Sepsis Labs: Recent Labs  Lab 12/07/23 1914 12/07/23 2148 12/08/23 0359 12/08/23 0542  LATICACIDVEN 1.7 2.3* 1.4 1.3    Recent Results (from the past 240 hours)  Resp panel by RT-PCR (RSV, Flu A&B, Covid) Anterior Nasal Swab     Status: None   Collection Time: 12/07/23  3:43 PM   Specimen: Anterior Nasal Swab  Result Value Ref Range Status   SARS Coronavirus 2 by RT PCR NEGATIVE NEGATIVE Final     Comment: (NOTE) SARS-CoV-2 target nucleic acids are NOT DETECTED.  The SARS-CoV-2 RNA is generally detectable in upper respiratory specimens during the acute phase of infection. The lowest concentration of SARS-CoV-2 viral copies this assay can detect is 138 copies/mL. A negative result does not preclude SARS-Cov-2 infection and should not be used as the sole basis for treatment or other patient management decisions. A negative result may occur with  improper specimen collection/handling, submission of specimen other than nasopharyngeal swab, presence of viral mutation(s) within the areas targeted by this assay, and inadequate number of viral copies(<138 copies/mL). A negative result must be combined with clinical observations, patient history, and epidemiological information. The expected result is Negative.  Fact Sheet for Patients:  bloggercourse.com  Fact Sheet for Healthcare Providers:  seriousbroker.it  This test is no t yet approved or cleared by the [place]  FDA and  has been authorized for detection and/or diagnosis of SARS-CoV-2 by  FDA under an Emergency Use Authorization (EUA). This EUA will remain  in effect (meaning this test can be used) for the duration of the COVID-19 declaration under Section 564(b)(1) of the Act, 21 U.S.C.section 360bbb-3(b)(1), unless the authorization is terminated  or revoked sooner.       Influenza A by PCR NEGATIVE NEGATIVE Final   Influenza B by PCR NEGATIVE NEGATIVE Final    Comment: (NOTE) The Xpert Xpress SARS-CoV-2/FLU/RSV plus assay is intended as an aid in the diagnosis of influenza from Nasopharyngeal swab specimens and should not be used as a sole basis for treatment. Nasal washings and aspirates are unacceptable for Xpert Xpress SARS-CoV-2/FLU/RSV testing.  Fact Sheet for Patients: bloggercourse.com  Fact Sheet for Healthcare  Providers: seriousbroker.it  This test is not yet approved or cleared by the [place]  FDA and has been authorized for detection and/or diagnosis of SARS-CoV-2 by FDA under an Emergency Use Authorization (EUA). This EUA will remain in effect (meaning this test can be used) for the duration of the COVID-19 declaration under Section 564(b)(1) of the Act, 21 U.S.C. section 360bbb-3(b)(1), unless the authorization is terminated or revoked.     Resp Syncytial Virus by PCR NEGATIVE NEGATIVE Final    Comment: (NOTE) Fact Sheet for Patients: bloggercourse.com  Fact Sheet for Healthcare Providers: seriousbroker.it  This test is not yet approved or cleared by the [place]  FDA and has been authorized for detection and/or diagnosis of SARS-CoV-2 by FDA under an Emergency Use Authorization (EUA). This EUA will remain in effect (meaning this test can be used) for the duration of the COVID-19 declaration under Section 564(b)(1) of the Act, 21 U.S.C. section 360bbb-3(b)(1), unless the authorization is terminated or revoked.  Performed at [facility], [address] [place], [place] [address]   Culture, blood (single)     Status: None (Preliminary result)   Collection Time: 12/07/23  3:51 PM   Specimen: BLOOD  Result Value Ref Range Status   Specimen Description   Final    BLOOD PORTA CATH Performed at [facility], [address] [place], [place] [address]    Special Requests   Final    Blood Culture adequate volume Performed at [facility], [address] [place], [place] [address]    Culture   Final    NO GROWTH 2 DAYS Performed at [facility], [address] [place], [place] [address]    Report Status PENDING  Incomplete  Culture, blood (single)     Status: None (Preliminary result)   Collection Time: 12/07/23  5:45 PM    Specimen: BLOOD  Result Value Ref Range Status   Specimen Description   Final    BLOOD RIGHT ANTECUBITAL Performed at [facility], [address], [place], [place] [address]    Special Requests   Final    Blood Culture adequate volume Performed at [facility], [address] [place], [place] [address]    Culture   Final    NO GROWTH 2 DAYS Performed at [facility], [address] [place], [place] [address]    Report Status PENDING  Incomplete  MRSA Next Gen by PCR, Nasal     Status: None   Collection Time: 12/07/23  9:09 PM   Specimen: Nasal Mucosa; Nasal Swab  Result Value Ref Range Status   MRSA by PCR Next Gen NOT DETECTED NOT DETECTED Final    Comment: (NOTE) The GeneXpert MRSA Assay (FDA approved for  NASAL specimens only), is one component of a comprehensive MRSA colonization surveillance program. It is not intended to diagnose MRSA infection nor to guide or monitor treatment for MRSA infections. Test performance is not FDA approved in patients less than 68 years old. Performed at [facility], [address], [place], [place] [address]          Radiology Studies: CT ABDOMEN PELVIS WO CONTRAST Result Date: 12/07/2023 CLINICAL DATA:  Abdominal pain and vomiting 3 days. History of lung cancer. EXAM: CT ABDOMEN AND PELVIS WITHOUT CONTRAST TECHNIQUE: Multidetector CT imaging of the abdomen and pelvis was performed following the standard protocol without IV contrast. RADIATION DOSE REDUCTION: This exam was performed according to the departmental dose-optimization program which includes automated exposure control, adjustment of the mA and/or kV according to patient size and/or use of iterative reconstruction technique. COMPARISON:  CT chest 09/10/2023 and PET-CT 06/22/2021 FINDINGS: Lower chest: Heart is normal size. Calcified plaque over the left main and 3 vessel coronary arteries. Calcified plaque over the  descending thoracic aorta. Visualized lung bases demonstrate no acute findings. Thin walled cyst over the left lower lobe unchanged. Minimal wall thickening of the distal esophagus which may be due to reflux esophagitis. Hepatobiliary: Mild cholelithiasis. Liver and biliary tree are normal. Pancreas: Normal. Spleen: Normal Adrenals/Urinary Tract: Adrenal glands are normal. Kidneys are normal in size. There is no hydronephrosis. Multiple calcifications over the kidneys most of which are likely vascular. Few small bilateral renal cortical hypodensities without significant change likely cysts. Ureters and bladder are normal. Stomach/Bowel: Stomach and small bowel are normal. Appendix is normal. Colon is unremarkable. 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 Just below the SMA, the aorta measures 3.9 x 4 cm without significant change. Remaining vascular structures are unremarkable. No adenopathy. Reproductive: Prostate is unremarkable. Other: No free fluid or focal inflammatory change. Small midline epigastric ventral hernia containing only peritoneal fat. Musculoskeletal: Right hip arthroplasty intact. Posterior fusion hardware intact at the L5-S1 level. No focal bony abnormality or acute process. IMPRESSION: 1. No acute findings in the abdomen/pelvis. 2. Mild cholelithiasis. 3. Bilateral aortofemoral bypass graft unchanged. 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 Just below the SMA, the aorta measures 3.9 x 4 cm without significant change. Recommend vascular consultation if not done previously as well as surveillance with follow-up CT 12 months. 4. Mild uniform wall thickening of the distal esophagus which may be seen with reflux  esophagitis. 5. Small midline epigastric ventral hernia containing only peritoneal fat. 6. Aortic atherosclerosis. Atherosclerotic coronary artery disease. Aortic Atherosclerosis (ICD10-I70.0). Electronically Signed   By: Toribio Agreste M.D.   On: 12/07/2023 17:47        Scheduled Meds:  apixaban   5 mg Oral BID   atorvastatin   80 mg Oral Daily   Chlorhexidine  Gluconate Cloth  6 each Topical QHS   cholecalciferol   2,000 Units Oral Daily   escitalopram   10 mg Oral Daily   feeding supplement  237 mL Oral BID BM   metoprolol  tartrate  25 mg Oral BID   pantoprazole  (PROTONIX ) IV  40 mg Intravenous Q24H   sodium chloride  flush  3 mL Intravenous Q12H   tamsulosin   0.4 mg Oral Daily   umeclidinium bromide   1 puff Inhalation Daily  Continuous Infusions:          Sophie Mao, MD [facility] 12/09/2023, 10:47 AM

## 2023-12-10 ENCOUNTER — Inpatient Hospital Stay (HOSPITAL_COMMUNITY)

## 2023-12-10 DIAGNOSIS — I2510 Atherosclerotic heart disease of native coronary artery without angina pectoris: Secondary | ICD-10-CM | POA: Diagnosis not present

## 2023-12-10 DIAGNOSIS — C3411 Malignant neoplasm of upper lobe, right bronchus or lung: Secondary | ICD-10-CM

## 2023-12-10 DIAGNOSIS — E782 Mixed hyperlipidemia: Secondary | ICD-10-CM

## 2023-12-10 DIAGNOSIS — I739 Peripheral vascular disease, unspecified: Secondary | ICD-10-CM | POA: Diagnosis not present

## 2023-12-10 DIAGNOSIS — K219 Gastro-esophageal reflux disease without esophagitis: Secondary | ICD-10-CM

## 2023-12-10 DIAGNOSIS — I10 Essential (primary) hypertension: Secondary | ICD-10-CM | POA: Diagnosis not present

## 2023-12-10 DIAGNOSIS — I5030 Unspecified diastolic (congestive) heart failure: Secondary | ICD-10-CM | POA: Diagnosis not present

## 2023-12-10 DIAGNOSIS — K529 Noninfective gastroenteritis and colitis, unspecified: Secondary | ICD-10-CM | POA: Diagnosis not present

## 2023-12-10 DIAGNOSIS — I9589 Other hypotension: Secondary | ICD-10-CM

## 2023-12-10 DIAGNOSIS — J449 Chronic obstructive pulmonary disease, unspecified: Secondary | ICD-10-CM

## 2023-12-10 DIAGNOSIS — F32A Depression, unspecified: Secondary | ICD-10-CM

## 2023-12-10 DIAGNOSIS — I4891 Unspecified atrial fibrillation: Secondary | ICD-10-CM | POA: Diagnosis not present

## 2023-12-10 DIAGNOSIS — E876 Hypokalemia: Secondary | ICD-10-CM

## 2023-12-10 LAB — CBC WITH DIFFERENTIAL/PLATELET
Abs Immature Granulocytes: 0.03 K/uL (ref 0.00–0.07)
Basophils Absolute: 0.1 K/uL (ref 0.0–0.1)
Basophils Relative: 1 %
Eosinophils Absolute: 0.3 K/uL (ref 0.0–0.5)
Eosinophils Relative: 4 %
HCT: 33.5 % — ABNORMAL LOW (ref 39.0–52.0)
Hemoglobin: 11.3 g/dL — ABNORMAL LOW (ref 13.0–17.0)
Immature Granulocytes: 0 %
Lymphocytes Relative: 18 %
Lymphs Abs: 1.4 K/uL (ref 0.7–4.0)
MCH: 31.9 pg (ref 26.0–34.0)
MCHC: 33.7 g/dL (ref 30.0–36.0)
MCV: 94.6 fL (ref 80.0–100.0)
Monocytes Absolute: 0.6 K/uL (ref 0.1–1.0)
Monocytes Relative: 8 %
Neutro Abs: 5.6 K/uL (ref 1.7–7.7)
Neutrophils Relative %: 69 %
Platelets: 147 K/uL — ABNORMAL LOW (ref 150–400)
RBC: 3.54 MIL/uL — ABNORMAL LOW (ref 4.22–5.81)
RDW: 14 % (ref 11.5–15.5)
WBC: 8.1 K/uL (ref 4.0–10.5)
nRBC: 0 % (ref 0.0–0.2)

## 2023-12-10 LAB — GLUCOSE, CAPILLARY: Glucose-Capillary: 77 mg/dL (ref 70–99)

## 2023-12-10 LAB — BASIC METABOLIC PANEL WITH GFR
Anion gap: 8 (ref 5–15)
BUN: 18 mg/dL (ref 8–23)
CO2: 24 mmol/L (ref 22–32)
Calcium: 8.6 mg/dL — ABNORMAL LOW (ref 8.9–10.3)
Chloride: 107 mmol/L (ref 98–111)
Creatinine, Ser: 1.06 mg/dL (ref 0.61–1.24)
GFR, Estimated: >60 mL/min (ref >60)
Glucose, Bld: 80 mg/dL (ref 70–99)
Potassium: 3.9 mmol/L (ref 3.5–5.1)
Sodium: 139 mmol/L (ref 135–145)

## 2023-12-10 LAB — ECHOCARDIOGRAM COMPLETE
Area-P 1/2: 4.58 cm2
S' Lateral: 4 cm

## 2023-12-10 LAB — MAGNESIUM: Magnesium: 1.9 mg/dL (ref 1.7–2.4)

## 2023-12-10 MED ORDER — SODIUM CHLORIDE 0.9 % IV SOLN: INTRAVENOUS | Status: AC

## 2023-12-10 MED ORDER — PANTOPRAZOLE SODIUM 40 MG PO TBEC
40.0000 mg | DELAYED_RELEASE_TABLET | Freq: Every day | ORAL | Status: DC
  Administered 2023-12-11 – 2023-12-13 (×3): 40 mg via ORAL
  Filled 2023-12-10 (×3): qty 1

## 2023-12-10 NOTE — H&P (View-Only) (Signed)
  Progress Note  Patient Name: Darryl Ward Date of Encounter: 12/10/2023 [facility] Cardiologist: [name], MD   Interval Summary   Over the last 24 hrs, patient had soft BP requiring IVF and has been continuous IVF since 5 PM. He reports feeling a little bit lightheaded but denies any dyspnea or chest palpitations.   Vital Signs Vitals:   12/10/23 0500 12/10/23 0600 12/10/23 0745 12/10/23 0940  BP: (!) 77/58 99/69  119/84  Pulse: 93 (!) 49  (!) 111  Resp: 11 11    Temp:   97.7 F (36.5 C)   TempSrc:   Oral   SpO2: 100% 100%    Weight: 84 kg     Height:        Intake/Output Summary (Last 24 hours) at 12/10/2023 1103 Last data filed at 12/10/2023 0944 Gross per 24 hour  Intake 1760.46 ml  Output 2000 ml  Net -239.54 ml      12/10/2023    5:00 AM 12/09/2023    5:00 AM 12/08/2023    5:00 AM  Last 3 Weights  Weight (lbs) 185 lb 3 oz 183 lb 3.2 oz 181 lb  Weight (kg) 84 kg 83.1 kg 82.101 kg      Telemetry/ECG  Afib 90s-100s - Personally Reviewed  Physical Exam  GEN: No acute distress.   Neck: No JVD Cardiac: irregularly irregular and tachycardic, no murmurs, rubs, or gallops.  Respiratory: Clear to auscultation bilaterally. GI: Soft, nontender, non-distended  MS: No edema  Assessment & Plan  Darryl Ward is a 78 y.o. male with a hx of PAD s/p bilateral aortofemoral bypass graft, CAD s/p D1 PCI (2017), pAF, HTN, HFpEF who is being seen today for the evaluation of Afib with RVR in setting of GI illness at the request of Dr Cheryle. Discontinued dilt gtt on 11/17 and started metop 12.5 mg Q6H. Takes metop XL 25 mg at home.    #Afib with RVR #AKI-resolved #CAD s/p D1 PCI #PAD s/p bilateral aortofemoral bypass graft #HFpEF - Patient denies any palpitations and afib controlled with HR 90s-100s. Did have some hypotension requiring IVF. - Obtain TTE given hypotension, though very low suspicion for cardiac etiology - Cont metop 25 BID and plan  for DCCV in AM if patient still in Afib  - Cont Eliquis  and lipitor  - Will need vascular follow up for AAA 4.4 cm on CT and Hx of aortofemoral bypass - We will continue to follow  For questions or updates, please contact [facility] Please consult [email or web address] for contact info under   Signed, Joelle VEAR Ren Donley, MD

## 2023-12-10 NOTE — Progress Notes (Signed)
 PROGRESS NOTE    Darryl Ward  FMW:[id number] DOB: [birth year] DOA: 12/07/2023 PCP: Cook, Jayce G, DO   Chief Complaint  Patient presents with   Emesis    Brief Narrative:  78 y.o. year old male with medical history of hypertension, hyperlipidemia, CAD, PAD, paroxysmal A-fib, chronic diastolic CHF, COPD and history of lung cancer presented with malaise and fatigue after GI symptoms (nausea/vomiting/diarrhea).  On presentation, WBC was 12.9, creatinine was 1.5, lactic acid of 2.8 but repeat was normal, UA was normal.  CT of abdomen and pelvis showed no acute findings but she did show abdominal aortic aneurysm at 4.4 cm.  Patient was found to be in A-fib with RVR and had to be started on Cardizem  drip.  Cardiology was consulted.    Assessment & Plan:   Principal Problem:   Atrial fibrillation with rapid ventricular response (HCC) Active Problems:   GERD without esophagitis   Mixed hyperlipidemia   Coronary atherosclerosis of native coronary artery   PVD (peripheral vascular disease) (HCC)   COPD (chronic obstructive pulmonary disease) (HCC)   Primary adenocarcinoma of upper lobe of right lung (HCC)   Essential hypertension   Gastroenteritis   Hypokalemia   Hypomagnesemia   #1 paroxysmal A-fib with RVR -Likely secondary to recent GI illness. -Patient noted to have missed on a Cardizem  drip prior to admission which was subsequently discontinued per cardiology. - Patient with intermittent bouts of tachycardia and bradycardia. - Patient noted to have soft/low blood pressure overnight requiring IV fluids which she is currently on which we will continue for another 24 hours. - Patient started on metoprolol  25 mg twice daily per cardiology. - Continue Eliquis  for anticoagulation. - Per cardiology patient for DCCV in the a.m. if still in A-fib. - Cardiology recommending repeat 2D echo given hypotension. - Per cardiology.  2.  Acute gastroenteritis with nausea vomiting  diarrhea -Improved clinically with no further nausea vomiting or diarrhea. - IV antiemetics, IV fluids, supportive care. - Continue current diet.  3.  AKI - Resolved with hydration.  4.  Hypokalemia/hypomagnesemia - Potassium at 3.9. - Magnesium  at 1.9. - Repeat labs in AM.  5.  Thrombocytopenia -Likely secondary to acute gastroenteritis. - Platelet count stable. - Follow-up.  6.  Anemia of chronic disease -H&H stable.  7.  Hypertension/hyperlipidemia -Continue metoprolol  succinate, statin.  8.  BPH -Tamsulosin .  9.  GERD -PPI.  10.  Depression -Escitalopram .  11.  COPD -Stable.  12.  History of lung cancer -Apparently in remission. - Outpatient follow-up with oncology.  13.  Lactic acidosis -Resolved with hydration.  14.  Abdominal aortic aneurysm -Noted to be 4.4 cm on imaging. - Repeat imaging recommended in 12 months. - Outpatient follow-up with vascular surgery.  15.  Physical deconditioning -PT/OT recommended home health therapies.     DVT prophylaxis: Eliquis  Code Status: DNR Family Communication: Updated patient.  No family at bedside. Disposition: Remain in stepdown unit.  Status is: Inpatient Remains inpatient appropriate because: Severity of illness   Consultants:  Cardiology: Dr.Azobou 12/08/2023  Procedures:  CT abdomen and pelvis 12/07/2023   Antimicrobials:  Anti-infectives (From admission, onward)    Start     Dose/Rate Route Frequency Ordered Stop   12/07/23 1715  cefTRIAXone  (ROCEPHIN ) 2 g in sodium chloride  0.9 % 100 mL IVPB        2 g 200 mL/hr over 30 Minutes Intravenous Once 12/07/23 1711 12/07/23 1853   12/07/23 1715  metroNIDAZOLE  (FLAGYL ) IVPB 500 mg  500 mg 100 mL/hr over 60 Minutes Intravenous  Once 12/07/23 1711 12/07/23 1953         Subjective: Patient laying in bed.  Overall feeling better.  Denies any chest pain or shortness of breath.  Noted to have had soft blood pressure yesterday and  currently on IV fluids.  Objective: Vitals:   12/10/23 0500 12/10/23 0600 12/10/23 0745 12/10/23 0940  BP: (!) 77/58 99/69  119/84  Pulse: 93 (!) 49  (!) 111  Resp: 11 11    Temp:   97.7 F (36.5 C)   TempSrc:   Oral   SpO2: 100% 100%    Weight: 84 kg     Height:        Intake/Output Summary (Last 24 hours) at 12/10/2023 1147 Last data filed at 12/10/2023 0944 Gross per 24 hour  Intake 1760.46 ml  Output 2000 ml  Net -239.54 ml   Filed Weights   12/08/23 0500 12/09/23 0500 12/10/23 0500  Weight: 82.1 kg 83.1 kg 84 kg    Examination:  General exam: Appears calm and comfortable  Respiratory system: Clear to auscultation.  No wheezes, no crackles, no rhonchi.  Fair air movement.  Speaking in full sentences.  No use of accessory muscles of respiration.  Cardiovascular system: Irregularly irregular.  No JVD, no murmurs rubs or gallops.  No pitting lower extremity edema.   Gastrointestinal system: Abdomen is nondistended, soft and nontender. No organomegaly or masses felt. Normal bowel sounds heard. Central nervous system: Alert and oriented. No focal neurological deficits. Extremities: Symmetric 5 x 5 power. Skin: No rashes, lesions or ulcers Psychiatry: Judgement and insight appear normal. Mood & affect appropriate.     Data Reviewed: I have personally reviewed following labs and imaging studies  CBC: Recent Labs  Lab 12/07/23 1551 12/08/23 0400 12/09/23 0407 12/10/23 0501  WBC 12.9* 10.2 9.0 8.1  NEUTROABS  --   --  6.5 5.6  HGB 13.9 11.2* 11.5* 11.3*  HCT 40.0 32.8* 34.1* 33.5*  MCV 92.8 93.4 94.5 94.6  PLT 173 153 148* 147*    Basic Metabolic Panel: Recent Labs  Lab 12/07/23 1551 12/08/23 0400 12/08/23 1214 12/09/23 0407 12/10/23 0501  NA 139 138  --  136 139  K 3.4* 3.1*  --  3.9 3.9  CL 98 102  --  103 107  CO2 25 24  --  25 24  GLUCOSE 120* 99  --  92 80  BUN 31* 28*  --  22 18  CREATININE 1.50* 1.13  --  0.99 1.06  CALCIUM  10.2 8.9  --  9.0  8.6*  MG 2.1  --  1.6* 1.5* 1.9    GFR: Estimated Creatinine Clearance: 61.2 mL/min (by C-G formula based on SCr of 1.06 mg/dL).  Liver Function Tests: Recent Labs  Lab 12/07/23 1551  AST 24  ALT 9  ALKPHOS 78  BILITOT 1.0  PROT 7.4  ALBUMIN  4.0    CBG: Recent Labs  Lab 12/09/23 0832 12/10/23 0741  GLUCAP 88 77     Recent Results (from the past 240 hours)  Resp panel by RT-PCR (RSV, Flu A&B, Covid) Anterior Nasal Swab     Status: None   Collection Time: 12/07/23  3:43 PM   Specimen: Anterior Nasal Swab  Result Value Ref Range Status   SARS Coronavirus 2 by RT PCR NEGATIVE NEGATIVE Final    Comment: (NOTE) SARS-CoV-2 target nucleic acids are NOT DETECTED.  The SARS-CoV-2 RNA is generally detectable in  upper respiratory specimens during the acute phase of infection. The lowest concentration of SARS-CoV-2 viral copies this assay can detect is 138 copies/mL. A negative result does not preclude SARS-Cov-2 infection and should not be used as the sole basis for treatment or other patient management decisions. A negative result may occur with  improper specimen collection/handling, submission of specimen other than nasopharyngeal swab, presence of viral mutation(s) within the areas targeted by this assay, and inadequate number of viral copies(<138 copies/mL). A negative result must be combined with clinical observations, patient history, and epidemiological information. The expected result is Negative.  Fact Sheet for Patients:  bloggercourse.com  Fact Sheet for Healthcare Providers:  seriousbroker.it  This test is no t yet approved or cleared by the [place]  FDA and  has been authorized for detection and/or diagnosis of SARS-CoV-2 by FDA under an Emergency Use Authorization (EUA). This EUA will remain  in effect (meaning this test can be used) for the duration of the COVID-19 declaration under Section 564(b)(1)  of the Act, 21 U.S.C.section 360bbb-3(b)(1), unless the authorization is terminated  or revoked sooner.       Influenza A by PCR NEGATIVE NEGATIVE Final   Influenza B by PCR NEGATIVE NEGATIVE Final    Comment: (NOTE) The Xpert Xpress SARS-CoV-2/FLU/RSV plus assay is intended as an aid in the diagnosis of influenza from Nasopharyngeal swab specimens and should not be used as a sole basis for treatment. Nasal washings and aspirates are unacceptable for Xpert Xpress SARS-CoV-2/FLU/RSV testing.  Fact Sheet for Patients: bloggercourse.com  Fact Sheet for Healthcare Providers: seriousbroker.it  This test is not yet approved or cleared by the [place]  FDA and has been authorized for detection and/or diagnosis of SARS-CoV-2 by FDA under an Emergency Use Authorization (EUA). This EUA will remain in effect (meaning this test can be used) for the duration of the COVID-19 declaration under Section 564(b)(1) of the Act, 21 U.S.C. section 360bbb-3(b)(1), unless the authorization is terminated or revoked.     Resp Syncytial Virus by PCR NEGATIVE NEGATIVE Final    Comment: (NOTE) Fact Sheet for Patients: bloggercourse.com  Fact Sheet for Healthcare Providers: seriousbroker.it  This test is not yet approved or cleared by the [place]  FDA and has been authorized for detection and/or diagnosis of SARS-CoV-2 by FDA under an Emergency Use Authorization (EUA). This EUA will remain in effect (meaning this test can be used) for the duration of the COVID-19 declaration under Section 564(b)(1) of the Act, 21 U.S.C. section 360bbb-3(b)(1), unless the authorization is terminated or revoked.  Performed at [facility], [address] [place], [place] [address]   Culture, blood (single)     Status: None (Preliminary result)   Collection Time: 12/07/23  3:51 PM    Specimen: BLOOD  Result Value Ref Range Status   Specimen Description   Final    BLOOD PORTA CATH Performed at [facility], [address] [place], [place] [address]    Special Requests   Final    Blood Culture adequate volume Performed at [facility], [address] [place], [place] [address]    Culture   Final    NO GROWTH 3 DAYS Performed at [facility], [address] [place], [place] [address]    Report Status PENDING  Incomplete  Culture, blood (single)     Status: None (Preliminary result)   Collection Time: 12/07/23  5:45 PM   Specimen: BLOOD  Result Value Ref Range Status  Specimen Description   Final    BLOOD RIGHT ANTECUBITAL Performed at [facility], [address], [place], [place] [address]    Special Requests   Final    Blood Culture adequate volume Performed at [facility], [address] [place], [place] [address]    Culture   Final    NO GROWTH 3 DAYS Performed at [facility], [address], [place], [place] [address]    Report Status PENDING  Incomplete  MRSA Next Gen by PCR, Nasal     Status: None   Collection Time: 12/07/23  9:09 PM   Specimen: Nasal Mucosa; Nasal Swab  Result Value Ref Range Status   MRSA by PCR Next Gen NOT DETECTED NOT DETECTED Final    Comment: (NOTE) The GeneXpert MRSA Assay (FDA approved for NASAL specimens only), is one component of a comprehensive MRSA colonization surveillance program. It is not intended to diagnose MRSA infection nor to guide or monitor treatment for MRSA infections. Test performance is not FDA approved in patients less than 82 years old. Performed at [facility], [address] [place], [place] [address]          Radiology Studies: No results found.      Scheduled Meds:  apixaban   5 mg Oral BID   atorvastatin   80 mg Oral Daily   Chlorhexidine  Gluconate Cloth  6 each  Topical QHS   cholecalciferol   2,000 Units Oral Daily   escitalopram   10 mg Oral Daily   feeding supplement  237 mL Oral BID BM   magnesium  oxide  400 mg Oral Daily   metoprolol  tartrate  25 mg Oral BID   [START ON 12/11/2023] pantoprazole   40 mg Oral Daily   sodium chloride  flush  3 mL Intravenous Q12H   tamsulosin   0.4 mg Oral Daily   umeclidinium bromide   1 puff Inhalation Daily   Continuous Infusions:  sodium chloride  75 mL/hr at 12/10/23 0841     LOS: 3 days    Time spent: 40 minutes    Toribio Hummer, MD [facility]   To contact the attending provider between 7A-7P or the covering provider during after hours 7P-7A, please log into the web site [email or web address] and access using universal [place] password for that web site. If you do not have the password, please call the hospital operator.  12/10/2023, 11:47 AM

## 2023-12-10 NOTE — Progress Notes (Signed)
  Progress Note  Patient Name: Darryl Ward Date of Encounter: 12/10/2023 [facility] Cardiologist: [name], MD   Interval Summary   Over the last 24 hrs, patient had soft BP requiring IVF and has been continuous IVF since 5 PM. He reports feeling a little bit lightheaded but denies any dyspnea or chest palpitations.   Vital Signs Vitals:   12/10/23 0500 12/10/23 0600 12/10/23 0745 12/10/23 0940  BP: (!) 77/58 99/69  119/84  Pulse: 93 (!) 49  (!) 111  Resp: 11 11    Temp:   97.7 F (36.5 C)   TempSrc:   Oral   SpO2: 100% 100%    Weight: 84 kg     Height:        Intake/Output Summary (Last 24 hours) at 12/10/2023 1103 Last data filed at 12/10/2023 0944 Gross per 24 hour  Intake 1760.46 ml  Output 2000 ml  Net -239.54 ml      12/10/2023    5:00 AM 12/09/2023    5:00 AM 12/08/2023    5:00 AM  Last 3 Weights  Weight (lbs) 185 lb 3 oz 183 lb 3.2 oz 181 lb  Weight (kg) 84 kg 83.1 kg 82.101 kg      Telemetry/ECG  Afib 90s-100s - Personally Reviewed  Physical Exam  GEN: No acute distress.   Neck: No JVD Cardiac: irregularly irregular and tachycardic, no murmurs, rubs, or gallops.  Respiratory: Clear to auscultation bilaterally. GI: Soft, nontender, non-distended  MS: No edema  Assessment & Plan  Darryl Ward is a 78 y.o. male with a hx of PAD s/p bilateral aortofemoral bypass graft, CAD s/p D1 PCI (2017), pAF, HTN, HFpEF who is being seen today for the evaluation of Afib with RVR in setting of GI illness at the request of Dr Cheryle. Discontinued dilt gtt on 11/17 and started metop 12.5 mg Q6H. Takes metop XL 25 mg at home.    #Afib with RVR #AKI-resolved #CAD s/p D1 PCI #PAD s/p bilateral aortofemoral bypass graft #HFpEF - Patient denies any palpitations and afib controlled with HR 90s-100s. Did have some hypotension requiring IVF. - Obtain TTE given hypotension, though very low suspicion for cardiac etiology - Cont metop 25 BID and plan  for DCCV in AM if patient still in Afib  - Cont Eliquis  and lipitor  - Will need vascular follow up for AAA 4.4 cm on CT and Hx of aortofemoral bypass - We will continue to follow  For questions or updates, please contact [facility] Please consult [email or web address] for contact info under   Signed, Joelle VEAR Ren Donley, MD

## 2023-12-10 NOTE — Plan of Care (Signed)
  Problem: Education: Goal: Knowledge of General Education information will improve Description: Including pain rating scale, medication(s)/side effects and non-pharmacologic comfort measures Outcome: Progressing   Problem: Health Behavior/Discharge Planning: Goal: Ability to manage health-related needs will improve Outcome: Progressing   Problem: Clinical Measurements: Goal: Ability to maintain clinical measurements within normal limits will improve Outcome: Progressing Goal: Diagnostic test results will improve Outcome: Progressing Goal: Respiratory complications will improve Outcome: Progressing   Problem: Activity: Goal: Risk for activity intolerance will decrease Outcome: Progressing   Problem: Nutrition: Goal: Adequate nutrition will be maintained Outcome: Progressing   

## 2023-12-10 NOTE — Progress Notes (Signed)
 PT Cancellation Note  Patient Details Name: Darryl Ward MRN: [id number] DOB: 10-May-1945   Cancelled Treatment:     Pt scheduled for a DCCV today.  Rehab Team to continue to follow and attempt to see another day.   Katheryn Leap  PTA Acute  Rehabilitation Services Office M-F          [phone]

## 2023-12-11 ENCOUNTER — Inpatient Hospital Stay (HOSPITAL_COMMUNITY): Admitting: Anesthesiology

## 2023-12-11 ENCOUNTER — Encounter (HOSPITAL_COMMUNITY): Payer: Self-pay | Admitting: Cardiovascular Disease

## 2023-12-11 ENCOUNTER — Encounter (HOSPITAL_COMMUNITY)
Admission: EM | Disposition: A | Discharge status: Home Health | Payer: Self-pay | Source: Home / Self Care | Attending: Internal Medicine

## 2023-12-11 DIAGNOSIS — I4891 Unspecified atrial fibrillation: Secondary | ICD-10-CM

## 2023-12-11 DIAGNOSIS — Z87891 Personal history of nicotine dependence: Secondary | ICD-10-CM | POA: Diagnosis not present

## 2023-12-11 DIAGNOSIS — I5030 Unspecified diastolic (congestive) heart failure: Secondary | ICD-10-CM | POA: Diagnosis not present

## 2023-12-11 DIAGNOSIS — K529 Noninfective gastroenteritis and colitis, unspecified: Secondary | ICD-10-CM | POA: Diagnosis not present

## 2023-12-11 DIAGNOSIS — I10 Essential (primary) hypertension: Secondary | ICD-10-CM | POA: Diagnosis not present

## 2023-12-11 DIAGNOSIS — I2510 Atherosclerotic heart disease of native coronary artery without angina pectoris: Secondary | ICD-10-CM | POA: Diagnosis not present

## 2023-12-11 DIAGNOSIS — C3411 Malignant neoplasm of upper lobe, right bronchus or lung: Secondary | ICD-10-CM | POA: Diagnosis not present

## 2023-12-11 DIAGNOSIS — I739 Peripheral vascular disease, unspecified: Secondary | ICD-10-CM | POA: Diagnosis not present

## 2023-12-11 HISTORY — PX: CARDIOVERSION: EP1203

## 2023-12-11 LAB — CBC WITH DIFFERENTIAL/PLATELET
Abs Immature Granulocytes: 0.05 K/uL (ref 0.00–0.07)
Basophils Absolute: 0.1 K/uL (ref 0.0–0.1)
Basophils Relative: 1 %
Eosinophils Absolute: 0.4 K/uL (ref 0.0–0.5)
Eosinophils Relative: 4 %
HCT: 34.3 % — ABNORMAL LOW (ref 39.0–52.0)
Hemoglobin: 11.5 g/dL — ABNORMAL LOW (ref 13.0–17.0)
Immature Granulocytes: 1 %
Lymphocytes Relative: 16 %
Lymphs Abs: 1.5 K/uL (ref 0.7–4.0)
MCH: 31.9 pg (ref 26.0–34.0)
MCHC: 33.5 g/dL (ref 30.0–36.0)
MCV: 95.3 fL (ref 80.0–100.0)
Monocytes Absolute: 0.7 K/uL (ref 0.1–1.0)
Monocytes Relative: 7 %
Neutro Abs: 6.6 K/uL (ref 1.7–7.7)
Neutrophils Relative %: 71 %
Platelets: 161 K/uL (ref 150–400)
RBC: 3.6 MIL/uL — ABNORMAL LOW (ref 4.22–5.81)
RDW: 14 % (ref 11.5–15.5)
WBC: 9.3 K/uL (ref 4.0–10.5)
nRBC: 0 % (ref 0.0–0.2)

## 2023-12-11 LAB — RENAL FUNCTION PANEL
Albumin: 3 g/dL — ABNORMAL LOW (ref 3.5–5.0)
Anion gap: 7 (ref 5–15)
BUN: 17 mg/dL (ref 8–23)
CO2: 23 mmol/L (ref 22–32)
Calcium: 8.7 mg/dL — ABNORMAL LOW (ref 8.9–10.3)
Chloride: 109 mmol/L (ref 98–111)
Creatinine, Ser: 1.01 mg/dL (ref 0.61–1.24)
GFR, Estimated: >60 mL/min (ref >60)
Glucose, Bld: 83 mg/dL (ref 70–99)
Phosphorus: 2.8 mg/dL (ref 2.5–4.6)
Potassium: 3.8 mmol/L (ref 3.5–5.1)
Sodium: 139 mmol/L (ref 135–145)

## 2023-12-11 LAB — GLUCOSE, CAPILLARY: Glucose-Capillary: 82 mg/dL (ref 70–99)

## 2023-12-11 LAB — MAGNESIUM: Magnesium: 1.8 mg/dL (ref 1.7–2.4)

## 2023-12-11 SURGERY — CARDIOVERSION (CATH LAB)
Anesthesia: General

## 2023-12-11 MED ORDER — POTASSIUM CHLORIDE CRYS ER 20 MEQ PO TBCR: 20.0000 meq | EXTENDED_RELEASE_TABLET | Freq: Once | ORAL | Status: DC

## 2023-12-11 MED ORDER — MAGNESIUM SULFATE 2 GM/50ML IV SOLN
2.0000 g | Freq: Once | INTRAVENOUS | Status: AC
  Administered 2023-12-11: 2 g via INTRAVENOUS
  Filled 2023-12-11: qty 50

## 2023-12-11 MED ORDER — PROPOFOL 10 MG/ML IV BOLUS
INTRAVENOUS | Status: DC | PRN
  Administered 2023-12-11: 60 mg via INTRAVENOUS

## 2023-12-11 MED ORDER — SODIUM CHLORIDE 0.9 % IV SOLN: INTRAVENOUS | Status: AC | PRN

## 2023-12-11 MED ORDER — LIDOCAINE 2% (20 MG/ML) 5 ML SYRINGE
INTRAMUSCULAR | Status: DC | PRN
  Administered 2023-12-11: 60 mg via INTRAVENOUS

## 2023-12-11 MED ORDER — FOLIC ACID 1 MG PO TABS
1.0000 mg | ORAL_TABLET | Freq: Every day | ORAL | Status: DC
  Administered 2023-12-11 – 2023-12-13 (×3): 1 mg via ORAL
  Filled 2023-12-11 (×3): qty 1

## 2023-12-11 MED ORDER — SODIUM CHLORIDE 0.9 % IV SOLN: INTRAVENOUS | Status: DC

## 2023-12-11 SURGICAL SUPPLY — 1 items: PAD DEFIB RADIO PHYSIO CONN (PAD) ×1 IMPLANT

## 2023-12-11 NOTE — Interval H&P Note (Signed)
 History and Physical Interval Note:  12/11/2023 11:09 AM  Darryl Ward  has presented today for surgery, with the diagnosis of atrial fibrillation.  The various methods of treatment have been discussed with the patient and family. After consideration of risks, benefits and other options for treatment, the patient has consented to  Procedure(s): CARDIOVERSION (N/A) as a surgical intervention.  The patient's history has been reviewed, patient examined, no change in status, stable for surgery.  I have reviewed the patient's chart and labs.  Questions were answered to the patient's satisfaction.     Aqeel Norgaard

## 2023-12-11 NOTE — Plan of Care (Signed)
 Patient returned from [facility] in sinus rhythm with occasional PACs, blood pressure stable, no complaints of pain or other concerns at this time.

## 2023-12-11 NOTE — Progress Notes (Signed)
 PT Cancellation Note  Patient Details Name: Darryl Ward MRN: [id number] DOB: 1945/09/14   Cancelled Treatment:     Patient is scheduled for cardioversion today. Will follow  after procedure.Noted PT cancelled 11/19 as was thought going for procedure .  Darice Potters PT Acute Rehabilitation Services Office [phone]    Potters Darice Norris 12/11/2023, 7:59 AM

## 2023-12-11 NOTE — Plan of Care (Signed)

## 2023-12-11 NOTE — Anesthesia Preprocedure Evaluation (Addendum)
 Anesthesia Evaluation  Patient identified by MRN, date of birth, ID band Patient awake    Reviewed: Allergy & Precautions, NPO status , Patient's Chart, lab work & pertinent test results  History of Anesthesia Complications Negative for: history of anesthetic complications  Airway Mallampati: II  TM Distance: >3 FB Neck ROM: Full    Dental no notable dental hx. (+) Edentulous Upper, Edentulous Lower   Pulmonary COPD, former smoker   Pulmonary exam normal breath sounds clear to auscultation       Cardiovascular hypertension, (-) angina + CAD, + Past MI, + Peripheral Vascular Disease and +CHF  Normal cardiovascular exam+ dysrhythmias Atrial Fibrillation  Rhythm:Irregular Rate:Abnormal  12/10/2023 TTE 1. Left ventricular ejection fraction, by estimation, is 40 to 45%. The  left ventricle has mildly decreased function. The left ventricle  demonstrates global hypokinesis. Left ventricular diastolic parameters are  indeterminate.   2. Right ventricular systolic function is moderately reduced. The right  ventricular size is normal.   3. Left atrial size was moderately dilated.   4. Right atrial size was mildly dilated.   5. The mitral valve is normal in structure. Trivial mitral valve  regurgitation. No evidence of mitral stenosis.   6. The aortic valve is tricuspid. There is mild calcification of the  aortic valve. Aortic valve regurgitation is not visualized. No aortic  stenosis is present.      Neuro/Psych    Depression    CVA    GI/Hepatic ,GERD  ,,  Endo/Other    Renal/GU      Musculoskeletal  (+) Arthritis ,    Abdominal   Peds  Hematology   Anesthesia Other Findings   Reproductive/Obstetrics                              Anesthesia Physical Anesthesia Plan  ASA: 3  Anesthesia Plan: General   Post-op Pain Management: Minimal or no pain anticipated   Induction:  Intravenous  PONV Risk Score and Plan: Treatment may vary due to age or medical condition and Ondansetron   Airway Management Planned: Mask and Natural Airway  Additional Equipment: None  Intra-op Plan:   Post-operative Plan: Extubation in OR  Informed Consent: I have reviewed the patients History and Physical, chart, labs and discussed the procedure including the risks, benefits and alternatives for the proposed anesthesia with the patient or authorized representative who has indicated his/her understanding and acceptance.     Dental advisory given  Plan Discussed with: CRNA and Surgeon  Anesthesia Plan Comments:          Anesthesia Quick Evaluation

## 2023-12-11 NOTE — Transfer of Care (Signed)
 Immediate Anesthesia Transfer of Care Note  Patient: Darryl Ward  Procedure(s) Performed: CARDIOVERSION  Patient Location: Cath Lab  Anesthesia Type:General  Level of Consciousness: awake, alert , and oriented  Airway & Oxygen Therapy: Patient Spontanous Breathing and Patient connected to nasal cannula oxygen  Post-op Assessment: Report given to RN and Post -op Vital signs reviewed and stable  Post vital signs: Reviewed and stable  Last Vitals:  Vitals Value Taken Time  BP 81/54 1208  Temp 97 1208  Pulse 60 1208  Resp 18 1208  SpO2 100 1208    Last Pain:  Vitals:   12/11/23 1015  TempSrc: Temporal  PainSc:          Complications: No notable events documented.

## 2023-12-11 NOTE — Progress Notes (Signed)
 PROGRESS NOTE    Darryl Ward  FMW:[id number] DOB: 07/27/1945 DOA: 12/07/2023 PCP: Cook, Jayce G, DO   Chief Complaint  Patient presents with   Emesis    Brief Narrative:  78 y.o. year old male with medical history of hypertension, hyperlipidemia, CAD, PAD, paroxysmal A-fib, chronic diastolic CHF, COPD and history of lung cancer presented with malaise and fatigue after GI symptoms (nausea/vomiting/diarrhea).  On presentation, WBC was 12.9, creatinine was 1.5, lactic acid of 2.8 but repeat was normal, UA was normal.  CT of abdomen and pelvis showed no acute findings but she did show abdominal aortic aneurysm at 4.4 cm.  Patient was found to be in A-fib with RVR and had to be started on Cardizem  drip.  Cardiology was consulted.    Assessment & Plan:   Principal Problem:   Atrial fibrillation with rapid ventricular response (HCC) Active Problems:   GERD without esophagitis   Mixed hyperlipidemia   Coronary atherosclerosis of native coronary artery   PVD (peripheral vascular disease) (HCC)   COPD (chronic obstructive pulmonary disease) (HCC)   Primary adenocarcinoma of upper lobe of right lung (HCC)   Essential hypertension   Gastroenteritis   Hypokalemia   Hypomagnesemia   #1 paroxysmal A-fib with RVR -Likely secondary to recent GI illness. -Patient noted to have missed on a Cardizem  drip prior to admission which was subsequently discontinued per cardiology. - Patient with intermittent bouts of tachycardia and bradycardia. - Patient noted to have soft/low blood pressure over the past 2 nights,  requiring IV fluids which he is currently on which we will continue for another 24 hours. - Patient started on metoprolol  25 mg twice daily per cardiology. -Patient still in A-fib with fluctuating heart rates. - Continue Eliquis  for anticoagulation. -2D echo with EF of 40 to 45%, left ventricular global hypokinesis, moderately reduced right ventricular systolic function, right  ventricular size is normal.  Moderately dilated left atrial size.  Mildly dilated right atrial size.  Trivial MVR.  No aortic stenosis is present. - Per cardiology patient for DCCV today.  - Per cardiology.  2.  Acute gastroenteritis with nausea vomiting diarrhea -Improved clinically with no further nausea vomiting or diarrhea. - IV antiemetics, IV fluids, supportive care. - Continue current diet.  3.  AKI - Resolved with hydration.  4.  Hypokalemia/hypomagnesemia - Potassium at 3.8. - Magnesium  at 1.8. -Magnesium  sulfate 2 g IV to keep magnesium  approximately at 2. -K. Dur 20 mEq p.o. x 1 to keep potassium approximately 4. - Repeat labs in AM.  5.  Thrombocytopenia -Likely secondary to acute gastroenteritis. - Improved.  - Follow-up.  6.  Anemia of chronic disease -H&H stable.  7.  Hypertension/hyperlipidemia - BP noted to be soft overnight.   - Currently on metoprolol  25 twice daily for A-fib.   - Patient on gentle hydration with IV fluids.   - Continue statin.    8.  BPH - Continue tamsulosin .  9.  GERD -PPI.  10.  Depression - Stable.   - Continue Lexapro .    11.  COPD -Stable.  12.  History of lung cancer -Apparently in remission. - Outpatient follow-up with oncology.  13.  Lactic acidosis - Resolved with hydration.   14.  Abdominal aortic aneurysm -Noted to be 4.4 cm on imaging. - Repeat imaging recommended in 12 months. - Outpatient follow-up with vascular surgery.  15.  Physical deconditioning -PT/OT recommended home health therapies.     DVT prophylaxis: Eliquis  Code Status: DNR Family Communication:  Updated patient.  No family at bedside. Disposition: Remain in stepdown unit.  Status is: Inpatient Remains inpatient appropriate because: Severity of illness   Consultants:  Cardiology: Dr.Azobou 12/08/2023  Procedures:  CT abdomen and pelvis 12/07/2023 2D echo 12/10/2023.  Antimicrobials:  Anti-infectives (From admission, onward)     Start     Dose/Rate Route Frequency Ordered Stop   12/07/23 1715  cefTRIAXone  (ROCEPHIN ) 2 g in sodium chloride  0.9 % 100 mL IVPB        2 g 200 mL/hr over 30 Minutes Intravenous Once 12/07/23 1711 12/07/23 1853   12/07/23 1715  metroNIDAZOLE (FLAGYL) IVPB 500 mg        500 mg 100 mL/hr over 60 Minutes Intravenous  Once 12/07/23 1711 12/07/23 1953         Subjective: Patient sleeping but easily arousable.  Patient denies any chest pain or shortness of breath.  No abdominal pain.  Blood pressures noted to be soft with systolics in the 90s.  Patient awaiting to go to [facility] for cardioversion today.  Objective: Vitals:   12/11/23 0630 12/11/23 0700 12/11/23 0800 12/11/23 0910  BP: 98/72 (!) 90/55 (!) 85/43   Pulse: 75 84 (!) 102   Resp: 14 13 18    Temp:   98.2 F (36.8 C)   TempSrc:   Oral   SpO2: 93% 98% 100%   Weight:    79.9 kg  Height:        Intake/Output Summary (Last 24 hours) at 12/11/2023 0947 Last data filed at 12/11/2023 0900 Gross per 24 hour  Intake 991.91 ml  Output 1525 ml  Net -533.09 ml   Filed Weights   12/09/23 0500 12/10/23 0500 12/11/23 0910  Weight: 83.1 kg 84 kg 79.9 kg    Examination:  General exam: Appears calm and comfortable  Respiratory system: CTAB.  No wheezes, no crackles, no rhonchi.  Fair air movement.  Speaking in full sentences.  No use of accessory muscles of respiration.   Cardiovascular system: Irregularly irregular.  No JVD, no murmurs rubs or gallops.  No pitting lower extremity edema.  Gastrointestinal system: Abdomen is soft, nontender, nondistended, positive bowel sounds.  No rebound.  No guarding.  Central nervous system: Alert and oriented. No focal neurological deficits. Extremities: Symmetric 5 x 5 power. Skin: No rashes, lesions or ulcers Psychiatry: Judgement and insight appear normal. Mood & affect appropriate.     Data Reviewed: I have personally reviewed following labs and imaging studies  CBC: Recent  Labs  Lab 12/07/23 1551 12/08/23 0400 12/09/23 0407 12/10/23 0501 12/11/23 0458  WBC 12.9* 10.2 9.0 8.1 9.3  NEUTROABS  --   --  6.5 5.6 6.6  HGB 13.9 11.2* 11.5* 11.3* 11.5*  HCT 40.0 32.8* 34.1* 33.5* 34.3*  MCV 92.8 93.4 94.5 94.6 95.3  PLT 173 153 148* 147* 161    Basic Metabolic Panel: Recent Labs  Lab 12/07/23 1551 12/08/23 0400 12/08/23 1214 12/09/23 0407 12/10/23 0501 12/11/23 0458  NA 139 138  --  136 139 139  K 3.4* 3.1*  --  3.9 3.9 3.8  CL 98 102  --  103 107 109  CO2 25 24  --  25 24 23   GLUCOSE 120* 99  --  92 80 83  BUN 31* 28*  --  22 18 17   CREATININE 1.50* 1.13  --  0.99 1.06 1.01  CALCIUM  10.2 8.9  --  9.0 8.6* 8.7*  MG 2.1  --  1.6* 1.5* 1.9  1.8  PHOS  --   --   --   --   --  2.8    GFR: Estimated Creatinine Clearance: 64.2 mL/min (by C-G formula based on SCr of 1.01 mg/dL).  Liver Function Tests: Recent Labs  Lab 12/07/23 1551 12/11/23 0458  AST 24  --   ALT 9  --   ALKPHOS 78  --   BILITOT 1.0  --   PROT 7.4  --   ALBUMIN  4.0 3.0*    CBG: Recent Labs  Lab 12/09/23 0832 12/10/23 0741 12/11/23 0727  GLUCAP 88 77 82     Recent Results (from the past 240 hours)  Resp panel by RT-PCR (RSV, Flu A&B, Covid) Anterior Nasal Swab     Status: None   Collection Time: 12/07/23  3:43 PM   Specimen: Anterior Nasal Swab  Result Value Ref Range Status   SARS Coronavirus 2 by RT PCR NEGATIVE NEGATIVE Final    Comment: (NOTE) SARS-CoV-2 target nucleic acids are NOT DETECTED.  The SARS-CoV-2 RNA is generally detectable in upper respiratory specimens during the acute phase of infection. The lowest concentration of SARS-CoV-2 viral copies this assay can detect is 138 copies/mL. A negative result does not preclude SARS-Cov-2 infection and should not be used as the sole basis for treatment or other patient management decisions. A negative result may occur with  improper specimen collection/handling, submission of specimen other than  nasopharyngeal swab, presence of viral mutation(s) within the areas targeted by this assay, and inadequate number of viral copies(<138 copies/mL). A negative result must be combined with clinical observations, patient history, and epidemiological information. The expected result is Negative.  Fact Sheet for Patients:  bloggercourse.com  Fact Sheet for Healthcare Providers:  seriousbroker.it  This test is no t yet approved or cleared by the [place]  FDA and  has been authorized for detection and/or diagnosis of SARS-CoV-2 by FDA under an Emergency Use Authorization (EUA). This EUA will remain  in effect (meaning this test can be used) for the duration of the COVID-19 declaration under Section 564(b)(1) of the Act, 21 U.S.C.section 360bbb-3(b)(1), unless the authorization is terminated  or revoked sooner.       Influenza A by PCR NEGATIVE NEGATIVE Final   Influenza B by PCR NEGATIVE NEGATIVE Final    Comment: (NOTE) The Xpert Xpress SARS-CoV-2/FLU/RSV plus assay is intended as an aid in the diagnosis of influenza from Nasopharyngeal swab specimens and should not be used as a sole basis for treatment. Nasal washings and aspirates are unacceptable for Xpert Xpress SARS-CoV-2/FLU/RSV testing.  Fact Sheet for Patients: bloggercourse.com  Fact Sheet for Healthcare Providers: seriousbroker.it  This test is not yet approved or cleared by the [place]  FDA and has been authorized for detection and/or diagnosis of SARS-CoV-2 by FDA under an Emergency Use Authorization (EUA). This EUA will remain in effect (meaning this test can be used) for the duration of the COVID-19 declaration under Section 564(b)(1) of the Act, 21 U.S.C. section 360bbb-3(b)(1), unless the authorization is terminated or revoked.     Resp Syncytial Virus by PCR NEGATIVE NEGATIVE Final    Comment:  (NOTE) Fact Sheet for Patients: bloggercourse.com  Fact Sheet for Healthcare Providers: seriousbroker.it  This test is not yet approved or cleared by the [place]  FDA and has been authorized for detection and/or diagnosis of SARS-CoV-2 by FDA under an Emergency Use Authorization (EUA). This EUA will remain in effect (meaning this test can be used) for the duration of the  COVID-19 declaration under Section 564(b)(1) of the Act, 21 U.S.C. section 360bbb-3(b)(1), unless the authorization is terminated or revoked.  Performed at [facility], [address] [place], [place] [address]   Culture, blood (single)     Status: None (Preliminary result)   Collection Time: 12/07/23  3:51 PM   Specimen: BLOOD  Result Value Ref Range Status   Specimen Description   Final    BLOOD PORTA CATH Performed at [facility], [address], [place], [place] [address]    Special Requests   Final    Blood Culture adequate volume Performed at [facility], [address] [place], [place] [address]    Culture   Final    NO GROWTH 4 DAYS Performed at [facility], [address] [place], [place] [address]    Report Status PENDING  Incomplete  Culture, blood (single)     Status: None (Preliminary result)   Collection Time: 12/07/23  5:45 PM   Specimen: BLOOD  Result Value Ref Range Status   Specimen Description   Final    BLOOD RIGHT ANTECUBITAL Performed at [facility], [address], [place], [place] [address]    Special Requests   Final    Blood Culture adequate volume Performed at [facility], [address] [place], [place] [address]    Culture   Final    NO GROWTH 4 DAYS Performed at [facility], [address] [place], [place] [address]    Report Status PENDING  Incomplete  MRSA Next Gen by PCR, Nasal     Status:  None   Collection Time: 12/07/23  9:09 PM   Specimen: Nasal Mucosa; Nasal Swab  Result Value Ref Range Status   MRSA by PCR Next Gen NOT DETECTED NOT DETECTED Final    Comment: (NOTE) The GeneXpert MRSA Assay (FDA approved for NASAL specimens only), is one component of a comprehensive MRSA colonization surveillance program. It is not intended to diagnose MRSA infection nor to guide or monitor treatment for MRSA infections. Test performance is not FDA approved in patients less than 36 years old. Performed at [facility], [address] [place], [place] [address]          Radiology Studies: ECHOCARDIOGRAM COMPLETE Result Date: 12/10/2023    ECHOCARDIOGRAM REPORT   Patient Name:   Darryl Ward Date of Exam: 12/10/2023 Medical Rec #:  [id number]       Height:       70.0 in Accession #:    [id number]      Weight:       185.2 lb Date of Birth:  Oct 27, 1945        BSA:          2.020 m Patient Age:    78 years        BP:           126/102 mmHg Patient Gender: M               HR:           94 bpm. Exam Location:  Inpatient Procedure: 2D Echo (Both Spectral and Color Flow Doppler were utilized during            procedure). Indications:    Hypotension  History:        Patient has prior history of Echocardiogram examinations. CAD,  COPD; Risk Factors:Hypertension.  Sonographer:    Charmaine Gaskins Referring Phys: [id number] H AZOBOU TONLEU  Sonographer Comments: Technically challenging study due to limited acoustic windows, Technically difficult study due to poor echo windows and no subcostal window. Image acquisition challenging due to respiratory motion and Supine position. IMPRESSIONS  1. Left ventricular ejection fraction, by estimation, is 40 to 45%. The left ventricle has mildly decreased function. The left ventricle demonstrates global hypokinesis. Left ventricular diastolic parameters are indeterminate.  2. Right ventricular systolic function is moderately  reduced. The right ventricular size is normal.  3. Left atrial size was moderately dilated.  4. Right atrial size was mildly dilated.  5. The mitral valve is normal in structure. Trivial mitral valve regurgitation. No evidence of mitral stenosis.  6. The aortic valve is tricuspid. There is mild calcification of the aortic valve. Aortic valve regurgitation is not visualized. No aortic stenosis is present. FINDINGS  Left Ventricle: Left ventricular ejection fraction, by estimation, is 40 to 45%. The left ventricle has mildly decreased function. The left ventricle demonstrates global hypokinesis. The left ventricular internal cavity size was normal in size. There is  no left ventricular hypertrophy. Left ventricular diastolic parameters are indeterminate. Right Ventricle: The right ventricular size is normal. No increase in right ventricular wall thickness. Right ventricular systolic function is moderately reduced. Left Atrium: Left atrial size was moderately dilated. Right Atrium: Right atrial size was mildly dilated. Pericardium: There is no evidence of pericardial effusion. Mitral Valve: The mitral valve is normal in structure. Trivial mitral valve regurgitation. No evidence of mitral valve stenosis. Tricuspid Valve: The tricuspid valve is normal in structure. Tricuspid valve regurgitation is mild . No evidence of tricuspid stenosis. Aortic Valve: The aortic valve is tricuspid. There is mild calcification of the aortic valve. Aortic valve regurgitation is not visualized. No aortic stenosis is present. Pulmonic Valve: The pulmonic valve was normal in structure. Pulmonic valve regurgitation is mild. No evidence of pulmonic stenosis. Aorta: The aortic root is normal in size and structure. Venous: The inferior vena cava was not well visualized. IAS/Shunts: No atrial level shunt detected by color flow Doppler.  LEFT VENTRICLE PLAX 2D LVIDd:         5.20 cm   Diastology LVIDs:         4.00 cm   LV e' medial:    9.67 cm/s  LV PW:         1.00 cm   LV E/e' medial:  9.2 LV IVS:        1.00 cm   LV e' lateral:   8.83 cm/s LVOT diam:     2.10 cm   LV E/e' lateral: 10.1 LVOT Area:     3.46 cm  RIGHT VENTRICLE RV Basal diam:  2.60 cm RV Mid diam:    2.40 cm RV S prime:     10.30 cm/s LEFT ATRIUM             Index LA diam:        3.70 cm 1.83 cm/m LA Vol (A2C):   62.4 ml 30.89 ml/m LA Vol (A4C):   82.5 ml 40.84 ml/m LA Biplane Vol: 78.4 ml 38.81 ml/m   AORTA Ao Root diam: 3.70 cm Ao Asc diam:  3.60 cm MITRAL VALVE               TRICUSPID VALVE MV Area (PHT): 4.58 cm    TR Peak grad:   21.2 mmHg MV E velocity: 88.73 cm/s  TR Vmax:        230.00 cm/s                             SHUNTS                            Systemic Diam: 2.10 cm Toribio Fuel MD Electronically signed by Toribio Fuel MD Signature Date/Time: 12/10/2023/2:50:42 PM    Final         Scheduled Meds:  apixaban   5 mg Oral BID   atorvastatin   80 mg Oral Daily   Chlorhexidine  Gluconate Cloth  6 each Topical QHS   cholecalciferol   2,000 Units Oral Daily   escitalopram   10 mg Oral Daily   feeding supplement  237 mL Oral BID BM   magnesium  oxide  400 mg Oral Daily   metoprolol  tartrate  25 mg Oral BID   pantoprazole   40 mg Oral Daily   sodium chloride  flush  3 mL Intravenous Q12H   tamsulosin   0.4 mg Oral Daily   umeclidinium bromide   1 puff Inhalation Daily   Continuous Infusions:  sodium chloride  75 mL/hr at 12/11/23 0300     LOS: 4 days    Time spent: 40 minutes    Toribio Hummer, MD [facility]   To contact the attending provider between 7A-7P or the covering provider during after hours 7P-7A, please log into the web site [email or web address] and access using universal [place] password for that web site. If you do not have the password, please call the hospital operator.  12/11/2023, 9:47 AM

## 2023-12-11 NOTE — Progress Notes (Signed)
 Patient seen and examined, note reviewed with the signed Resident Physician/Advanced Practice Provider. I personally reviewed laboratory data, imaging studies and relevant notes. I independently examined the patient and formulated the important aspects of the plan. I have personally discussed the plan with the patient and/or family. Comments or changes to the note/plan are indicated below.  Exam:  General: Well nourished, well developed, in no acute distress HEENT: Supple, no JVD Cardiac: Normal S1, S2; RRR; no murmurs, rubs, or gallops Lungs: CTAB w/ no wheezing, rhonchi or rales  Abd: Soft, nontender, no hepatomegaly  Ext: No edema, pulses 2+ Skin: Warm and dry, no rashes   Neuro: AOA x 3  Data EKG(s) and pertinent labs, studies, etc were personally reviewed and interpreted by me:  I have personally reviewed the EKG/Telemetry: NSR I have personally reviewed the lab data  Assessment & Plan: Darryl Ward is a 78 y.o. male with a hx of PAD s/p bilateral aortofemoral bypass graft, CAD s/p D1 PCI (2017), pAF, HTN, HFpEF who is being seen today for the evaluation of Afib with RVR in setting of GI illness at the request of Dr Cheryle. Discontinued dilt gtt on 11/17 and started metop 12.5 mg Q6H. Takes metop XL 25 mg at home. Has had some hypotension requiring IVF. TTE with EF 40-45% (down from 55% 2/24) and moderately reduced RV systolic function.    #Afib with RVR #AKI-resolved #CAD s/p D1 PCI #PAD s/p bilateral aortofemoral bypass graft #HFpEF - s/p DCCV today and back to NSR w/ borderline BP; will hold metoprolol  today - TTE with reduced EF; Ddx includes Afib or ischemia; Will repeat limited TTE in 2-3 months and if persistently reduced LV function in NSR, will pursue ischemic evaluation - RV function now moderately reduced; likely Group II - Will defer GDMT outpatient for now given recent AKI and borderline BP - Cont Eliquis  and lipitor  - Will need vascular follow up for AAA 4.4 cm on  CT and Hx of aortofemoral bypass - We will continue to follow  Signed, Joelle DEL. Ren Ny, MD [place]  [facility]  12/11/2023 1:25 PM

## 2023-12-11 NOTE — Anesthesia Postprocedure Evaluation (Signed)
 Anesthesia Post Note  Patient: Darryl Ward  Procedure(s) Performed: CARDIOVERSION     Patient location during evaluation: Cath Lab Anesthesia Type: General Level of consciousness: awake and alert Pain management: pain level controlled Vital Signs Assessment: post-procedure vital signs reviewed and stable Respiratory status: spontaneous breathing, nonlabored ventilation, respiratory function stable and patient connected to nasal cannula oxygen Cardiovascular status: blood pressure returned to baseline and stable Postop Assessment: no apparent nausea or vomiting Anesthetic complications: no   There were no known notable events for this encounter.  Last Vitals:  Vitals:   12/11/23 1245 12/11/23 1330  BP: 108/64 108/74  Pulse: (!) 56   Resp: 12 17  Temp:    SpO2: 100% 100%    Last Pain:  Vitals:   12/11/23 1015  TempSrc: Temporal  PainSc:                  [name]

## 2023-12-11 NOTE — Op Note (Signed)
 Procedure: Electrical Cardioversion Indications:  Atrial Fibrillation  Procedure Details:  Consent: Risks of procedure as well as the alternatives and risks of each were explained to the (patient/caregiver).  Consent for procedure obtained.  Time Out: Verified patient identification, verified procedure, site/side was marked, verified correct patient position, special equipment/implants available, medications/allergies/relevent history reviewed, required imaging and test results available.  Performed  Patient placed on cardiac monitor, pulse oximetry, supplemental oxygen as necessary.  Sedation given: IV propofol , Dr. Julieann Pacer pads placed anterior and posterior chest.  Cardioverted 1 time(s).  Cardioversion with synchronized biphasic 200J shock.  Evaluation: Findings: Post procedure EKG shows: brief episode of ectopic atrial tachycardia, followed by NSR Complications: None Patient did tolerate procedure well.  Time Spent Directly with the Patient:  30 minutes   Darryl Ward 12/11/2023, 12:00 PM

## 2023-12-12 ENCOUNTER — Inpatient Hospital Stay (HOSPITAL_COMMUNITY)

## 2023-12-12 DIAGNOSIS — I2510 Atherosclerotic heart disease of native coronary artery without angina pectoris: Secondary | ICD-10-CM | POA: Diagnosis not present

## 2023-12-12 DIAGNOSIS — C3411 Malignant neoplasm of upper lobe, right bronchus or lung: Secondary | ICD-10-CM | POA: Diagnosis not present

## 2023-12-12 DIAGNOSIS — K529 Noninfective gastroenteritis and colitis, unspecified: Secondary | ICD-10-CM | POA: Diagnosis not present

## 2023-12-12 DIAGNOSIS — I739 Peripheral vascular disease, unspecified: Secondary | ICD-10-CM | POA: Diagnosis not present

## 2023-12-12 DIAGNOSIS — I4891 Unspecified atrial fibrillation: Secondary | ICD-10-CM | POA: Diagnosis not present

## 2023-12-12 DIAGNOSIS — I10 Essential (primary) hypertension: Secondary | ICD-10-CM | POA: Diagnosis not present

## 2023-12-12 DIAGNOSIS — I5030 Unspecified diastolic (congestive) heart failure: Secondary | ICD-10-CM | POA: Diagnosis not present

## 2023-12-12 LAB — CULTURE, BLOOD (SINGLE)
Culture: NO GROWTH
Culture: NO GROWTH
Special Requests: ADEQUATE
Special Requests: ADEQUATE

## 2023-12-12 LAB — BASIC METABOLIC PANEL WITH GFR
Anion gap: 7 (ref 5–15)
BUN: 18 mg/dL (ref 8–23)
CO2: 23 mmol/L (ref 22–32)
Calcium: 8.7 mg/dL — ABNORMAL LOW (ref 8.9–10.3)
Chloride: 108 mmol/L (ref 98–111)
Creatinine, Ser: 1.04 mg/dL (ref 0.61–1.24)
GFR, Estimated: >60 mL/min (ref >60)
Glucose, Bld: 90 mg/dL (ref 70–99)
Potassium: 3.7 mmol/L (ref 3.5–5.1)
Sodium: 139 mmol/L (ref 135–145)

## 2023-12-12 LAB — CBC
HCT: 32.6 % — ABNORMAL LOW (ref 39.0–52.0)
Hemoglobin: 10.8 g/dL — ABNORMAL LOW (ref 13.0–17.0)
MCH: 31.6 pg (ref 26.0–34.0)
MCHC: 33.1 g/dL (ref 30.0–36.0)
MCV: 95.3 fL (ref 80.0–100.0)
Platelets: 142 K/uL — ABNORMAL LOW (ref 150–400)
RBC: 3.42 MIL/uL — ABNORMAL LOW (ref 4.22–5.81)
RDW: 14.3 % (ref 11.5–15.5)
WBC: 8.6 K/uL (ref 4.0–10.5)
nRBC: 0 % (ref 0.0–0.2)

## 2023-12-12 LAB — MAGNESIUM: Magnesium: 2.1 mg/dL (ref 1.7–2.4)

## 2023-12-12 LAB — GLUCOSE, CAPILLARY: Glucose-Capillary: 91 mg/dL (ref 70–99)

## 2023-12-12 MED ORDER — ALBUMIN HUMAN 25 % IV SOLN
25.0000 g | Freq: Once | INTRAVENOUS | Status: AC
  Administered 2023-12-12: 25 g via INTRAVENOUS
  Filled 2023-12-12: qty 100

## 2023-12-12 MED ORDER — METOPROLOL SUCCINATE ER 25 MG PO TB24
12.5000 mg | ORAL_TABLET | Freq: Every day | ORAL | Status: DC
  Administered 2023-12-12: 12.5 mg via ORAL
  Filled 2023-12-12: qty 1

## 2023-12-12 MED ORDER — GUAIFENESIN-DM 100-10 MG/5ML PO SYRP
5.0000 mL | ORAL_SOLUTION | ORAL | Status: DC | PRN
  Administered 2023-12-12: 5 mL via ORAL
  Filled 2023-12-12: qty 10

## 2023-12-12 NOTE — Plan of Care (Signed)

## 2023-12-12 NOTE — Progress Notes (Signed)
 Heart Failure Navigator Progress Note  Assessed for Heart & Vascular TOC clinic readiness.  Patient does not meet criteria due to hospital follow up scheduled for 12/31/2023. No HF TOC. .   Navigator will sign off at this time.   Stephane Haddock, BSN, Scientist, Clinical (histocompatibility And Immunogenetics) Only

## 2023-12-12 NOTE — Plan of Care (Signed)
 Metoprolol  discontinued due to significant decrease in SBP during orthostatic blood pressure readings (patient was asymptomatic).  Did well ambulating in hallway with PT and sitting up in recliner for much of the day.  BP stable.

## 2023-12-12 NOTE — Progress Notes (Signed)
 PROGRESS NOTE    Darryl Ward  FMW:[id number] DOB: February 15, 1945 DOA: 12/07/2023 PCP: Cook, Jayce G, DO   Chief Complaint  Patient presents with   Emesis    Brief Narrative:  78 y.o. year old male with medical history of hypertension, hyperlipidemia, CAD, PAD, paroxysmal A-fib, chronic diastolic CHF, COPD and history of lung cancer presented with malaise and fatigue after GI symptoms (nausea/vomiting/diarrhea).  On presentation, WBC was 12.9, creatinine was 1.5, lactic acid of 2.8 but repeat was normal, UA was normal.  CT of abdomen and pelvis showed no acute findings but she did show abdominal aortic aneurysm at 4.4 cm.  Patient was found to be in A-fib with RVR and had to be started on Cardizem  drip.  Cardiology was consulted.    Assessment & Plan:   Principal Problem:   Atrial fibrillation with rapid ventricular response (HCC) Active Problems:   GERD without esophagitis   Mixed hyperlipidemia   Coronary atherosclerosis of native coronary artery   PVD (peripheral vascular disease) (HCC)   COPD (chronic obstructive pulmonary disease) (HCC)   Primary adenocarcinoma of upper lobe of right lung (HCC)   Essential hypertension   Gastroenteritis   Hypokalemia   Hypomagnesemia   #1 paroxysmal A-fib with RVR -Likely secondary to recent GI illness. -Patient noted to have missed on a Cardizem  drip prior to admission which was subsequently discontinued per cardiology. - Patient with intermittent bouts of tachycardia and bradycardia. - Patient noted to have soft/low blood pressure over the past 2 nights,  requiring IV fluids which he is currently on which we will continue for another 24 hours. - Patient started on metoprolol  25 mg twice daily per cardiology. -Patient noted on 12/11/2023 to still be in A-fib with fluctuating heart rates. - Continue Eliquis  for anticoagulation. -2D echo with EF of 40 to 45%, left ventricular global hypokinesis, moderately reduced right ventricular  systolic function, right ventricular size is normal.  Moderately dilated left atrial size.  Mildly dilated right atrial size.  Trivial MVR.  No aortic stenosis is present. - Patient is status post successful electrical cardioversion 12/11/2023 and currently in normal sinus rhythm.  - Metoprolol  dose decreased to 12.5 mg twice daily per cardiology. - Per cardiology.  2.  Acute gastroenteritis with nausea vomiting diarrhea -Improved clinically with no further nausea vomiting or diarrhea. - IV antiemetics, IV fluids, supportive care. - Continue current diet.  3.  AKI - Resolved with hydration.  4.  Hypokalemia/hypomagnesemia - Potassium at 3.7. - Magnesium  at 2.1. - KCl 40 mEq p.o. x 1 to keep potassium approximately 4.. - Repeat labs in AM.  5.  Thrombocytopenia -Likely secondary to acute gastroenteritis. - Improved.  - Follow.  6.  Anemia of chronic disease -H&H stable.  7.  Hypertension/hyperlipidemia/soft blood pressure/??  Orthostasis - BP noted to be soft over the past 2 nights.  -Metoprolol  dose decreased to 12.5 mg twice daily per cardiology today. -Orthostatics ordered. -IV albumin  x 1. -TED hose. -Continue statin.  8.  BPH - Continue tamsulosin .  9.  GERD - Continue PPI.  10.  Depression - Stable.   - Lexapro .    11.  COPD -Stable.  12.  History of lung cancer -Apparently in remission. - Outpatient follow-up with oncology.  13.  Lactic acidosis - Resolved with hydration.   14.  Abdominal aortic aneurysm -Noted to be 4.4 cm on imaging. - Repeat imaging recommended in 12 months. - Outpatient follow-up with vascular surgery.  15.  Physical deconditioning -PT/OT  recommended home health therapies.  16.??  OSA -Per RN patient with some apneic episodes while sleeping. - Patient denies any prior history of OSA. - Ambulatory pulmonary referral for further evaluation made.     DVT prophylaxis: Eliquis  Code Status: DNR Family Communication: Updated  patient.  No family at bedside. Disposition: Remain in stepdown unit.  Status is: Inpatient Remains inpatient appropriate because: Severity of illness   Consultants:  Cardiology: Dr.Azobou 12/08/2023  Procedures:  CT abdomen and pelvis 12/07/2023 2D echo 12/10/2023. Electrical cardioversion per cardiology: Dr. Francyne 12/11/2023  Antimicrobials:  Anti-infectives (From admission, onward)    Start     Dose/Rate Route Frequency Ordered Stop   12/07/23 1715  cefTRIAXone  (ROCEPHIN ) 2 g in sodium chloride  0.9 % 100 mL IVPB        2 g 200 mL/hr over 30 Minutes Intravenous Once 12/07/23 1711 12/07/23 1853   12/07/23 1715  metroNIDAZOLE  (FLAGYL ) IVPB 500 mg        500 mg 100 mL/hr over 60 Minutes Intravenous  Once 12/07/23 1711 12/07/23 1953         Subjective: Patient sitting up in bed eating breakfast.  Denies any chest pain or shortness of breath.  No abdominal pain.  No lightheadedness or dizziness.  Denies any further diarrhea.  Noted to have systolic blood pressures in the 90s.  Currently receiving IV albumin .  Per RN patient noted to desat overnight when sleeping.  Patient denies any prior history of OSA.    Objective: Vitals:   12/12/23 0700 12/12/23 0800 12/12/23 0819 12/12/23 0900  BP: (!) 101/42 (!) 113/53  (!) 106/56  Pulse: (!) 56 60  (!) 53  Resp: 14 11  14   Temp: 97.9 F (36.6 C)     TempSrc: Axillary     SpO2: 97% 99% 100% 100%  Weight:      Height:        Intake/Output Summary (Last 24 hours) at 12/12/2023 1023 Last data filed at 12/12/2023 0400 Gross per 24 hour  Intake 55.23 ml  Output 650 ml  Net -594.77 ml   Filed Weights   12/10/23 0500 12/11/23 0910 12/12/23 0415  Weight: 84 kg 79.9 kg 79.6 kg    Examination:  General exam: NAD Respiratory system: Lungs clear to auscultation bilaterally.  No wheezes, no crackles, no rhonchi.  Fair air movement.  Speaking in full sentences.  No use of accessory muscles of respiration.  Cardiovascular system:  Regular rate and rhythm no murmurs rubs or gallops.  No JVD.  No lower extremity edema.  Gastrointestinal system: Abdomen is soft, nontender, nondistended, positive bowel sounds.  No rebound.  No guarding.  Central nervous system: Alert and oriented. No focal neurological deficits. Extremities: Symmetric 5 x 5 power. Skin: No rashes, lesions or ulcers Psychiatry: Judgement and insight appear normal. Mood & affect appropriate.     Data Reviewed: I have personally reviewed following labs and imaging studies  CBC: Recent Labs  Lab 12/08/23 0400 12/09/23 0407 12/10/23 0501 12/11/23 0458 12/12/23 0417  WBC 10.2 9.0 8.1 9.3 8.6  NEUTROABS  --  6.5 5.6 6.6  --   HGB 11.2* 11.5* 11.3* 11.5* 10.8*  HCT 32.8* 34.1* 33.5* 34.3* 32.6*  MCV 93.4 94.5 94.6 95.3 95.3  PLT 153 148* 147* 161 142*    Basic Metabolic Panel: Recent Labs  Lab 12/08/23 0400 12/08/23 1214 12/09/23 0407 12/10/23 0501 12/11/23 0458 12/12/23 0417  NA 138  --  136 139 139 139  K 3.1*  --  3.9 3.9 3.8 3.7  CL 102  --  103 107 109 108  CO2 24  --  25 24 23 23   GLUCOSE 99  --  92 80 83 90  BUN 28*  --  22 18 17 18   CREATININE 1.13  --  0.99 1.06 1.01 1.04  CALCIUM  8.9  --  9.0 8.6* 8.7* 8.7*  MG  --  1.6* 1.5* 1.9 1.8 2.1  PHOS  --   --   --   --  2.8  --     GFR: Estimated Creatinine Clearance: 62.4 mL/min (by C-G formula based on SCr of 1.04 mg/dL).  Liver Function Tests: Recent Labs  Lab 12/07/23 1551 12/11/23 0458  AST 24  --   ALT 9  --   ALKPHOS 78  --   BILITOT 1.0  --   PROT 7.4  --   ALBUMIN  4.0 3.0*    CBG: Recent Labs  Lab 12/09/23 0832 12/10/23 0741 12/11/23 0727 12/12/23 0815  GLUCAP 88 77 82 91     Recent Results (from the past 240 hours)  Resp panel by RT-PCR (RSV, Flu A&B, Covid) Anterior Nasal Swab     Status: None   Collection Time: 12/07/23  3:43 PM   Specimen: Anterior Nasal Swab  Result Value Ref Range Status   SARS Coronavirus 2 by RT PCR NEGATIVE NEGATIVE Final     Comment: (NOTE) SARS-CoV-2 target nucleic acids are NOT DETECTED.  The SARS-CoV-2 RNA is generally detectable in upper respiratory specimens during the acute phase of infection. The lowest concentration of SARS-CoV-2 viral copies this assay can detect is 138 copies/mL. A negative result does not preclude SARS-Cov-2 infection and should not be used as the sole basis for treatment or other patient management decisions. A negative result may occur with  improper specimen collection/handling, submission of specimen other than nasopharyngeal swab, presence of viral mutation(s) within the areas targeted by this assay, and inadequate number of viral copies(<138 copies/mL). A negative result must be combined with clinical observations, patient history, and epidemiological information. The expected result is Negative.  Fact Sheet for Patients:  bloggercourse.com  Fact Sheet for Healthcare Providers:  seriousbroker.it  This test is no t yet approved or cleared by the [place]  FDA and  has been authorized for detection and/or diagnosis of SARS-CoV-2 by FDA under an Emergency Use Authorization (EUA). This EUA will remain  in effect (meaning this test can be used) for the duration of the COVID-19 declaration under Section 564(b)(1) of the Act, 21 U.S.C.section 360bbb-3(b)(1), unless the authorization is terminated  or revoked sooner.       Influenza A by PCR NEGATIVE NEGATIVE Final   Influenza B by PCR NEGATIVE NEGATIVE Final    Comment: (NOTE) The Xpert Xpress SARS-CoV-2/FLU/RSV plus assay is intended as an aid in the diagnosis of influenza from Nasopharyngeal swab specimens and should not be used as a sole basis for treatment. Nasal washings and aspirates are unacceptable for Xpert Xpress SARS-CoV-2/FLU/RSV testing.  Fact Sheet for Patients: bloggercourse.com  Fact Sheet for Healthcare  Providers: seriousbroker.it  This test is not yet approved or cleared by the [place]  FDA and has been authorized for detection and/or diagnosis of SARS-CoV-2 by FDA under an Emergency Use Authorization (EUA). This EUA will remain in effect (meaning this test can be used) for the duration of the COVID-19 declaration under Section 564(b)(1) of the Act, 21 U.S.C. section 360bbb-3(b)(1), unless the authorization is terminated or revoked.  Resp Syncytial Virus by PCR NEGATIVE NEGATIVE Final    Comment: (NOTE) Fact Sheet for Patients: bloggercourse.com  Fact Sheet for Healthcare Providers: seriousbroker.it  This test is not yet approved or cleared by the [place]  FDA and has been authorized for detection and/or diagnosis of SARS-CoV-2 by FDA under an Emergency Use Authorization (EUA). This EUA will remain in effect (meaning this test can be used) for the duration of the COVID-19 declaration under Section 564(b)(1) of the Act, 21 U.S.C. section 360bbb-3(b)(1), unless the authorization is terminated or revoked.  Performed at [facility], [address] [place], [place] [address]   Culture, blood (single)     Status: None   Collection Time: 12/07/23  3:51 PM   Specimen: BLOOD  Result Value Ref Range Status   Specimen Description   Final    BLOOD PORTA CATH Performed at [facility], [address] [place], [place] [address]    Special Requests   Final    Blood Culture adequate volume Performed at [facility], [address], [place], [place] [address]    Culture   Final    NO GROWTH 5 DAYS Performed at [facility], [address], [place], [place] [address]    Report Status 12/12/2023 FINAL  Final  Culture, blood (single)     Status: None   Collection Time: 12/07/23  5:45 PM   Specimen: BLOOD  Result Value Ref Range  Status   Specimen Description   Final    BLOOD RIGHT ANTECUBITAL Performed at [facility], [address], [place], [place] [address]    Special Requests   Final    Blood Culture adequate volume Performed at [facility], [address] [place], [place] [address]    Culture   Final    NO GROWTH 5 DAYS Performed at [facility], [address] [place], [place] [address]    Report Status 12/12/2023 FINAL  Final  MRSA Next Gen by PCR, Nasal     Status: None   Collection Time: 12/07/23  9:09 PM   Specimen: Nasal Mucosa; Nasal Swab  Result Value Ref Range Status   MRSA by PCR Next Gen NOT DETECTED NOT DETECTED Final    Comment: (NOTE) The GeneXpert MRSA Assay (FDA approved for NASAL specimens only), is one component of a comprehensive MRSA colonization surveillance program. It is not intended to diagnose MRSA infection nor to guide or monitor treatment for MRSA infections. Test performance is not FDA approved in patients less than 3 years old. Performed at [facility], [address] [place], [place] [address]          Radiology Studies: EP STUDY Result Date: 12/11/2023 See surgical note for result.  ECHOCARDIOGRAM COMPLETE Result Date: 12/10/2023    ECHOCARDIOGRAM REPORT   Patient Name:   Darryl Ward Date of Exam: 12/10/2023 Medical Rec #:  [id number]       Height:       70.0 in Accession #:    [id number]      Weight:       185.2 lb Date of Birth:  1945/07/17        BSA:          2.020 m Patient Age:    78 years        BP:           126/102 mmHg Patient Gender: M  HR:           94 bpm. Exam Location:  Inpatient Procedure: 2D Echo (Both Spectral and Color Flow Doppler were utilized during            procedure). Indications:    Hypotension  History:        Patient has prior history of Echocardiogram examinations. CAD,                 COPD; Risk Factors:Hypertension.  Sonographer:    Charmaine Gaskins  Referring Phys: [id number] H AZOBOU TONLEU  Sonographer Comments: Technically challenging study due to limited acoustic windows, Technically difficult study due to poor echo windows and no subcostal window. Image acquisition challenging due to respiratory motion and Supine position. IMPRESSIONS  1. Left ventricular ejection fraction, by estimation, is 40 to 45%. The left ventricle has mildly decreased function. The left ventricle demonstrates global hypokinesis. Left ventricular diastolic parameters are indeterminate.  2. Right ventricular systolic function is moderately reduced. The right ventricular size is normal.  3. Left atrial size was moderately dilated.  4. Right atrial size was mildly dilated.  5. The mitral valve is normal in structure. Trivial mitral valve regurgitation. No evidence of mitral stenosis.  6. The aortic valve is tricuspid. There is mild calcification of the aortic valve. Aortic valve regurgitation is not visualized. No aortic stenosis is present. FINDINGS  Left Ventricle: Left ventricular ejection fraction, by estimation, is 40 to 45%. The left ventricle has mildly decreased function. The left ventricle demonstrates global hypokinesis. The left ventricular internal cavity size was normal in size. There is  no left ventricular hypertrophy. Left ventricular diastolic parameters are indeterminate. Right Ventricle: The right ventricular size is normal. No increase in right ventricular wall thickness. Right ventricular systolic function is moderately reduced. Left Atrium: Left atrial size was moderately dilated. Right Atrium: Right atrial size was mildly dilated. Pericardium: There is no evidence of pericardial effusion. Mitral Valve: The mitral valve is normal in structure. Trivial mitral valve regurgitation. No evidence of mitral valve stenosis. Tricuspid Valve: The tricuspid valve is normal in structure. Tricuspid valve regurgitation is mild . No evidence of tricuspid stenosis. Aortic  Valve: The aortic valve is tricuspid. There is mild calcification of the aortic valve. Aortic valve regurgitation is not visualized. No aortic stenosis is present. Pulmonic Valve: The pulmonic valve was normal in structure. Pulmonic valve regurgitation is mild. No evidence of pulmonic stenosis. Aorta: The aortic root is normal in size and structure. Venous: The inferior vena cava was not well visualized. IAS/Shunts: No atrial level shunt detected by color flow Doppler.  LEFT VENTRICLE PLAX 2D LVIDd:         5.20 cm   Diastology LVIDs:         4.00 cm   LV e' medial:    9.67 cm/s LV PW:         1.00 cm   LV E/e' medial:  9.2 LV IVS:        1.00 cm   LV e' lateral:   8.83 cm/s LVOT diam:     2.10 cm   LV E/e' lateral: 10.1 LVOT Area:     3.46 cm  RIGHT VENTRICLE RV Basal diam:  2.60 cm RV Mid diam:    2.40 cm RV S prime:     10.30 cm/s LEFT ATRIUM             Index LA diam:        3.70 cm  1.83 cm/m LA Vol (A2C):   62.4 ml 30.89 ml/m LA Vol (A4C):   82.5 ml 40.84 ml/m LA Biplane Vol: 78.4 ml 38.81 ml/m   AORTA Ao Root diam: 3.70 cm Ao Asc diam:  3.60 cm MITRAL VALVE               TRICUSPID VALVE MV Area (PHT): 4.58 cm    TR Peak grad:   21.2 mmHg MV E velocity: 88.73 cm/s  TR Vmax:        230.00 cm/s                             SHUNTS                            Systemic Diam: 2.10 cm Toribio Fuel MD Electronically signed by Toribio Fuel MD Signature Date/Time: 12/10/2023/2:50:42 PM    Final         Scheduled Meds:  apixaban   5 mg Oral BID   atorvastatin   80 mg Oral Daily   Chlorhexidine  Gluconate Cloth  6 each Topical QHS   cholecalciferol   2,000 Units Oral Daily   escitalopram   10 mg Oral Daily   feeding supplement  237 mL Oral BID BM   folic acid   1 mg Oral Daily   magnesium  oxide  400 mg Oral Daily   metoprolol  succinate  12.5 mg Oral Daily   pantoprazole   40 mg Oral Daily   potassium chloride   20 mEq Oral Once   sodium chloride  flush  3 mL Intravenous Q12H   tamsulosin   0.4 mg Oral  Daily   umeclidinium bromide   1 puff Inhalation Daily   Continuous Infusions:  sodium chloride  10 mL/hr at 12/12/23 0045     LOS: 5 days    Time spent: 40 minutes    Toribio Hummer, MD [facility]   To contact the attending provider between 7A-7P or the covering provider during after hours 7P-7A, please log into the web site [email or web address] and access using universal [place] password for that web site. If you do not have the password, please call the hospital operator.  12/12/2023, 10:23 AM

## 2023-12-12 NOTE — Progress Notes (Addendum)
  Progress Note  Patient Name: Darryl Ward Date of Encounter: 12/12/2023 [facility] Cardiologist: [name], MD   Interval Summary   Overnight, BP 90s-40s. Patient denies any more dizziness or dyspnea.   Vital Signs Vitals:   12/12/23 0550 12/12/23 0553 12/12/23 0554 12/12/23 0600  BP:    (!) 113/95  Pulse: (!) 56 78 62 62  Resp: (!) 9 17 15 13   Temp:      TempSrc:      SpO2: 99% (!) 82% 99% 100%  Weight:      Height:        Intake/Output Summary (Last 24 hours) at 12/12/2023 0707 Last data filed at 12/12/2023 0400 Gross per 24 hour  Intake 547.45 ml  Output 1050 ml  Net -502.55 ml      12/12/2023    4:15 AM 12/11/2023    9:10 AM 12/10/2023    5:00 AM  Last 3 Weights  Weight (lbs) 175 lb 8 oz 176 lb 1.6 oz 185 lb 3 oz  Weight (kg) 79.606 kg 79.878 kg 84 kg      Telemetry/ECG  SB - Personally Reviewed  Physical Exam  GEN: No acute distress.   Neck: No JVD Cardiac: Bradycardic but regular, no murmurs, rubs, or gallops.  Respiratory: Clear to auscultation bilaterally. GI: Soft, nontender, non-distended  MS: No edema  Assessment & Plan  Darryl Ward is a 78 y.o. male with a hx of PAD s/p bilateral aortofemoral bypass graft, CAD s/p D1 PCI (2017), pAF, HTN, HFpEF who is being seen today for the evaluation of Afib with RVR in setting of GI illness at the request of Dr Cheryle. Discontinued dilt gtt on 11/17 and started metop 12.5 mg Q6H. Takes metop XL 25 mg at home. Has had some hypotension requiring IVF. TTE with EF 40-45% (down from 55% 2/24) and moderately reduced RV systolic function. S/p DCCV on 11/20.    #Afib with RVR #AKI-resolved #CAD s/p D1 PCI #PAD s/p bilateral aortofemoral bypass graft #HFpEF - s/p DCCV yesterday. BP borderline overnight but appears better today with BP 100s/50s - Orthostatic today, so no metop XL 12.5 mg daily; can start it outpatient - Would obtain chest x-ray given oxygen requirements, though does not  appear that [name] is actually in place - TTE with reduced EF; Ddx includes Afib or ischemia; Will repeat limited TTE in 2-3 months and if persistently reduced LV function in NSR, will pursue ischemic evaluation - RV function now moderately reduced; likely Group II - Will defer GDMT outpatient for now given recent AKI and borderline BP - Cont Eliquis  and lipitor  - Will need vascular follow up for AAA 4.4 cm on CT and Hx of aortofemoral bypass - We will sign off; feel free to call us  back if you have any questions   For questions or updates, please contact [facility] Please consult [email or web address] for contact info under  Signed, Joelle VEAR Ren Donley, MD

## 2023-12-12 NOTE — Plan of Care (Signed)

## 2023-12-12 NOTE — Progress Notes (Signed)
 Physical Therapy Treatment Patient Details Name: Darryl Ward MRN: [id number] DOB: 1945-02-04 Today's Date: 12/12/2023   History of Present Illness Darryl Ward is a 78 y.o. year old male presenting to the ED 12/07/23 with malaise and fatigue after GI symptoms, in A fib with RVR. PMH: hypertension, hyperlipidemia complicated by CAD and PAD, paroxysmal A-fib, CHF , COPD and history of lung cancer, R  ORIF for periprosthetic  femur fracture April 2025.    PT Comments  Pt tolerated progression of activity well, he ambulated 120' with RW without loss of balance, HR 60s while walking.     If plan is discharge home, recommend the following: A little help with walking and/or transfers;A little help with bathing/dressing/bathroom;Direct supervision/assist for financial management;Assist for transportation;Help with stairs or ramp for entrance   Can travel by private vehicle        Equipment Recommendations  None recommended by PT    Recommendations for Other Services       Precautions / Restrictions Precautions Precautions: Fall Recall of Precautions/Restrictions: Intact Restrictions Weight Bearing Restrictions Per Provider Order: No     Mobility  Bed Mobility   Bed Mobility: Sit to Supine       Sit to supine: Independent        Transfers Overall transfer level: Needs assistance Equipment used: Rolling walker (2 wheels) Transfers: Sit to/from Stand Sit to Stand: Contact guard assist           General transfer comment: VCs hand placement    Ambulation/Gait Ambulation/Gait assistance: Contact guard assist Gait Distance (Feet): 120 Feet Assistive device: Rolling walker (2 wheels) Gait Pattern/deviations: WFL(Within Functional Limits) Gait velocity: WFL     General Gait Details: steady, no loss of balance, HR 60s with walking   Stairs             Wheelchair Mobility     Tilt Bed    Modified Rankin (Stroke Patients Only)       Balance  Overall balance assessment: History of Falls, Needs assistance Sitting-balance support: Feet supported, No upper extremity supported Sitting balance-Leahy Scale: Good     Standing balance support: Reliant on assistive device for balance, During functional activity, Bilateral upper extremity supported Standing balance-Leahy Scale: Fair                              Hotel Manager: Impaired Factors Affecting Communication: Hearing impaired  Cognition Arousal: Alert Behavior During Therapy: WFL for tasks assessed/performed   PT - Cognitive impairments: No apparent impairments                         Following commands: Intact      Cueing Cueing Techniques: Verbal cues  Exercises      General Comments        Pertinent Vitals/Pain Pain Assessment Pain Score: 0-No pain    Home Living                          Prior Function            PT Goals (current goals can now be found in the care plan section) Acute Rehab PT Goals Patient Stated Goal: go home PT Goal Formulation: With patient Time For Goal Achievement: 12/22/23 Potential to Achieve Goals: Good Progress towards PT goals: Progressing toward goals    Frequency    Min  3X/week      PT Plan      Co-evaluation              AM-PAC PT 6 Clicks Mobility   Outcome Measure  Help needed turning from your back to your side while in a flat bed without using bedrails?: None Help needed moving from lying on your back to sitting on the side of a flat bed without using bedrails?: A Little Help needed moving to and from a bed to a chair (including a wheelchair)?: A Little Help needed standing up from a chair using your arms (e.g., wheelchair or bedside chair)?: None Help needed to walk in hospital room?: None Help needed climbing 3-5 steps with a railing? : A Little 6 Click Score: 21    End of Session Equipment Utilized During Treatment: Gait  belt Activity Tolerance: Patient tolerated treatment well Patient left: in bed;with call bell/phone within reach;with bed alarm set;with nursing/sitter in room Nurse Communication: Mobility status PT Visit Diagnosis: Unsteadiness on feet (R26.81);History of falling (Z91.81)     Time: 8661-8645 PT Time Calculation (min) (ACUTE ONLY): 16 min  Charges:    $Gait Training: 8-22 mins PT General Charges $$ ACUTE PT VISIT: 1 Visit                     Sylvan Delon Copp PT 12/12/2023  Acute Rehabilitation Services  Office [phone]

## 2023-12-13 DIAGNOSIS — K529 Noninfective gastroenteritis and colitis, unspecified: Secondary | ICD-10-CM | POA: Diagnosis not present

## 2023-12-13 DIAGNOSIS — E876 Hypokalemia: Secondary | ICD-10-CM | POA: Diagnosis not present

## 2023-12-13 DIAGNOSIS — I4891 Unspecified atrial fibrillation: Secondary | ICD-10-CM | POA: Diagnosis not present

## 2023-12-13 DIAGNOSIS — I2510 Atherosclerotic heart disease of native coronary artery without angina pectoris: Secondary | ICD-10-CM

## 2023-12-13 DIAGNOSIS — I739 Peripheral vascular disease, unspecified: Secondary | ICD-10-CM

## 2023-12-13 LAB — CBC WITH DIFFERENTIAL/PLATELET
Abs Immature Granulocytes: 0.04 K/uL (ref 0.00–0.07)
Basophils Absolute: 0.1 K/uL (ref 0.0–0.1)
Basophils Relative: 1 %
Eosinophils Absolute: 0.3 K/uL (ref 0.0–0.5)
Eosinophils Relative: 3 %
HCT: 31 % — ABNORMAL LOW (ref 39.0–52.0)
Hemoglobin: 10.2 g/dL — ABNORMAL LOW (ref 13.0–17.0)
Immature Granulocytes: 1 %
Lymphocytes Relative: 18 %
Lymphs Abs: 1.5 K/uL (ref 0.7–4.0)
MCH: 31.3 pg (ref 26.0–34.0)
MCHC: 32.9 g/dL (ref 30.0–36.0)
MCV: 95.1 fL (ref 80.0–100.0)
Monocytes Absolute: 0.8 K/uL (ref 0.1–1.0)
Monocytes Relative: 9 %
Neutro Abs: 5.9 K/uL (ref 1.7–7.7)
Neutrophils Relative %: 68 %
Platelets: 137 K/uL — ABNORMAL LOW (ref 150–400)
RBC: 3.26 MIL/uL — ABNORMAL LOW (ref 4.22–5.81)
RDW: 14.2 % (ref 11.5–15.5)
WBC: 8.5 K/uL (ref 4.0–10.5)
nRBC: 0 % (ref 0.0–0.2)

## 2023-12-13 LAB — BASIC METABOLIC PANEL WITH GFR
Anion gap: 8 (ref 5–15)
BUN: 16 mg/dL (ref 8–23)
CO2: 24 mmol/L (ref 22–32)
Calcium: 8.7 mg/dL — ABNORMAL LOW (ref 8.9–10.3)
Chloride: 106 mmol/L (ref 98–111)
Creatinine, Ser: 0.98 mg/dL (ref 0.61–1.24)
GFR, Estimated: >60 mL/min (ref >60)
Glucose, Bld: 101 mg/dL — ABNORMAL HIGH (ref 70–99)
Potassium: 3.8 mmol/L (ref 3.5–5.1)
Sodium: 137 mmol/L (ref 135–145)

## 2023-12-13 LAB — GLUCOSE, CAPILLARY: Glucose-Capillary: 91 mg/dL (ref 70–99)

## 2023-12-13 LAB — CORTISOL-AM, BLOOD: Cortisol - AM: 7.3 ug/dL (ref 6.7–22.6)

## 2023-12-13 LAB — MAGNESIUM: Magnesium: 1.9 mg/dL (ref 1.7–2.4)

## 2023-12-13 MED ORDER — ACETAMINOPHEN 325 MG PO TABS: 650.0000 mg | ORAL_TABLET | Freq: Four times a day (QID) | ORAL | Status: AC | PRN

## 2023-12-13 MED ORDER — HEPARIN SOD (PORK) LOCK FLUSH 100 UNIT/ML IV SOLN
500.0000 [IU] | INTRAVENOUS | Status: AC | PRN
  Administered 2023-12-13: 500 [IU]
  Filled 2023-12-13: qty 5

## 2023-12-13 MED ORDER — ENSURE PLUS HIGH PROTEIN PO LIQD: 237.0000 mL | Freq: Two times a day (BID) | ORAL | Status: AC

## 2023-12-13 NOTE — Discharge Summary (Signed)
 Physician Discharge Summary  Darryl Ward FMW:[id number] DOB: 08/09/1945 DOA: 12/07/2023  PCP: Cook, Jayce G, DO  Admit date: 12/07/2023 Discharge date: 12/13/2023  Time spent: 65 minutes  Recommendations for Outpatient Follow-up:  Follow-up with Laymon Qua, PA cardiology on 12/31/2023 at 2:30 PM.  On follow-up patient will need a basic metabolic profile, magnesium  level checked to follow-up on electrolytes and renal function.  BP will need to be followed up upon. Follow-up with Cook, Jayce G, DO in 2 weeks.  On follow-up patient will need a basic metabolic profile and magnesium  level done to follow-up on electrolytes and renal function.  Patient will need a CBC done to follow-up on counts.  BP will need to be reassessed on follow-up.  Patient will need imaging done in 12 months for follow-up on AAA and may benefit from outpatient referral to vascular surgery if patient is not being followed by vascular surgery. Ambulatory pulmonary referral made for further evaluation of possible OSA.   Discharge Diagnoses:  Principal Problem:   Atrial fibrillation with rapid ventricular response (HCC) Active Problems:   GERD without esophagitis   Mixed hyperlipidemia   Coronary atherosclerosis of native coronary artery   PVD (peripheral vascular disease) (HCC)   COPD (chronic obstructive pulmonary disease) (HCC)   Primary adenocarcinoma of upper lobe of right lung (HCC)   Essential hypertension   Gastroenteritis   Hypokalemia   Hypomagnesemia   Discharge Condition: Stable and improved.  Diet recommendation: Heart healthy  Filed Weights   12/11/23 0910 12/12/23 0415 12/13/23 0343  Weight: 79.9 kg 79.6 kg 79.8 kg    History of present illness:  HPI per Dr. Fernand Aleene Darryl Ward is a 78 y.o. year old male with medical history of hypertension, hyperlipidemia complicated by CAD and PAD, paroxysmal A-fib, CHF (EF 55% in 02/2022) COPD and history of lung cancer presenting to the ED with  malaise and fatigue after GI symptoms.     Pt states he developed GI symptoms starting on Thursday and continuing into the weekend. He does not report eating anything out of the ordinary. He ate mexican food that he states is very good and his wife ate it as well but she did not get sick. He reports symptoms of n/v/d but states he decreased his intake further and the symptoms improved. He developed significant weakness today that prompted him to seek care. He denies any CP, SHOB or flu like symptoms.      On arrival to the ED patient was noted to be HDS stable.  Lab work and imaging obtained.  CBC with leukocytosis at 12.9, CMP with mild hypokalemia at 3.4, slight creatinine elevation at 1.5 with baseline around 1.2-1.3, lipase normal, UA normal, BNP slightly elevated at 617, CK normal, magnesium  normal, lactic acid elevated at 2.8 with repeat showing normalization.  CTAP without any acute findings but did show abdominal aortic aneurysm at 4.4 cm.  Patient noted to be in A-fib with RVR, so for further care [facility] contacted for admission.  Hospital Course:  #1 paroxysmal A-fib with RVR/CAD status post D1 PCI -Likely secondary to recent GI illness. -Patient noted to have been placed on a Cardizem  drip prior to admission which was subsequently discontinued per cardiology. - Patient with intermittent bouts of tachycardia and bradycardia. - Patient noted to have soft/low blood pressure during the hospitalization requiring IV fluids and decreased doses of patient's beta-blocker.  - Patient started on metoprolol  25 mg twice daily per cardiology and dose decreased during the hospitalization  and subsequently discontinued due to soft/low blood pressure.. -Patient noted on 12/11/2023 to still be in A-fib with fluctuating heart rates. - Patient maintained on Eliquis  for anticoagulation.  -2D echo with EF of 40 to 45%, left ventricular global hypokinesis, moderately reduced right ventricular systolic function,  right ventricular size is normal.  Moderately dilated left atrial size.  Mildly dilated right atrial size.  Trivial MVR.  No aortic stenosis is present. - Patient status post successful electrical cardioversion 12/11/2023 and remained in normal sinus rhythm for the rest of the hospitalization -metoprolol  dose initially decreased to 12.5 mg twice daily per cardiology however due to soft blood pressure and orthostasis metoprolol  dose was discontinued.   -Patient unable to be placed on GDMT for now due to recent AKI and borderline blood pressure during the hospitalization and will need to be followed up upon per cardiology in the outpatient setting. - Patient improved clinically and be discharged in stable and improved condition.   - Outpatient follow-up with cardiology as scheduled.   - Patient also noted with concerns for OSA and as such ambulatory pulmonary referral made for further evaluation.     2.  Acute gastroenteritis with nausea vomiting diarrhea -Improved clinically with no further nausea vomiting or diarrhea. - Patient maintained on IV antiemetics, IV fluids, supportive care during the hospitalization. -Patient tolerated diet during the hospitalization..   3.  AKI - Resolved with hydration.   4.  Hypokalemia/hypomagnesemia - Repleted during the hospitalization.   - Outpatient follow-up.    5.  Thrombocytopenia -Likely secondary to acute gastroenteritis. - Improved during the hospitalization.    6.  Anemia of chronic disease -H&H remained stable throughout the hospitalization.   7.  Hypertension/hyperlipidemia/soft blood pressure/??  Orthostasis - BP noted to be soft during the hospitalization.   -Metoprolol  dose decreased to 12.5 mg twice daily per cardiology today. -Orthostatics ordered and patient noted to be orthostatic however remained asymptomatic. - Metoprolol  subsequently discontinued per cardiology. - Patient status post IV albumin  x 1 with improvement with blood  pressure since that post albumin  systolic blood pressures consistently remained above 105. - Patient also placed on TED hose. - Patient maintained on home regimen statin. - Patient's antihypertensive medications will be held on discharge until follow-up with PCP and cardiology. - Patient be discharged in stable and improved condition..   8.  BPH - Patient maintained on home regimen tamsulosin .   9.  GERD - Patient maintained on a PPI.   10.  Depression - Stable.   - Patient maintained on home regimen Lexapro .    11.  COPD -Stable.   12.  History of lung cancer -Apparently in remission. - Outpatient follow-up with oncology.   13.  Lactic acidosis - Resolved with hydration.    14.  Abdominal aortic aneurysm -Noted to be 4.4 cm on imaging. - Repeat imaging recommended in 12 months. - Outpatient follow-up with vascular surgery.   15.  Physical deconditioning -PT/OT recommended home health therapies.   16.??  OSA -Per RN patient with some apneic episodes while sleeping. - Patient denies any prior history of OSA. - Ambulatory pulmonary referral made for further evaluation.    Procedures: CT abdomen and pelvis 12/07/2023 2D echo 12/10/2023. Electrical cardioversion per cardiology: Dr. Francyne 12/11/2023  Consultations: Cardiology: Sidonie 12/08/2023   Discharge Exam: Vitals:   12/13/23 0812 12/13/23 0836  BP:    Pulse: 65   Resp: 18   Temp:    SpO2: 99% 96%    General:  NAD Cardiovascular: RRR no murmurs rubs or gallops.  No JVD.  No lower extremity edema. Respiratory: Clear to auscultation bilaterally.  No wheezes, no crackles, no rhonchi.  Fair air movement.  Speaking in full sentences.  Discharge Instructions   Discharge Instructions     Diet - low sodium heart healthy   Complete by: As directed    Increase activity slowly   Complete by: As directed    Pulmonary Visit   Complete by: As directed    Reason for referral: Sleep/Apnea       Allergies as of 12/13/2023   No Known Allergies      Medication List     PAUSE taking these medications    chlorthalidone  25 MG tablet Wait to take this until your doctor or other care provider tells you to start again. Commonly known as: HYGROTON  Take 0.5 tablets (12.5 mg total) by mouth daily. What changed: how much to take   furosemide  20 MG tablet Wait to take this until your doctor or other care provider tells you to start again. Commonly known as: LASIX  Take 1 tablet (20 mg total) by mouth daily as needed. What changed: when to take this   metoprolol  succinate 25 MG 24 hr tablet Wait to take this until your doctor or other care provider tells you to start again. Commonly known as: TOPROL -XL Take 1 tablet (25 mg total) by mouth daily.       STOP taking these medications    HYDROcodone -acetaminophen  5-325 MG tablet Commonly known as: NORCO/VICODIN       TAKE these medications    acetaminophen  325 MG tablet Commonly known as: TYLENOL  Take 2 tablets (650 mg total) by mouth every 6 (six) hours as needed for mild pain (pain score 1-3) or fever (or Fever >/= 101).   albuterol  108 (90 Base) MCG/ACT inhaler Commonly known as: VENTOLIN  HFA Inhale 2 puffs into the lungs every 6 (six) hours as needed for wheezing or shortness of breath.   apixaban  5 MG Tabs tablet Commonly known as: Eliquis  Take 1 tablet (5 mg total) by mouth 2 (two) times daily.   atorvastatin  80 MG tablet Commonly known as: LIPITOR  Take 1 tablet (80 mg total) by mouth daily.   Cholecalciferol  50 MCG (2000 UT) Caps Take 1 capsule by mouth daily.   escitalopram  10 MG tablet Commonly known as: LEXAPRO  Take 1 tablet (10 mg total) by mouth daily.   feeding supplement Liqd Take 237 mLs by mouth 2 (two) times daily between meals.   fluticasone  50 MCG/ACT nasal spray Commonly known as: FLONASE  Place 2 sprays into both nostrils daily.   folic acid  1 MG tablet Commonly known as: KP Folic  Acid Take 1 tablet (1 mg total) by mouth daily.   Incruse Ellipta  62.5 MCG/ACT Aepb Generic drug: umeclidinium bromide  Inhale 1 puff into the lungs daily. What changed:  when to take this reasons to take this   magnesium  oxide 400 (240 Mg) MG tablet Commonly known as: MAG-OX Take 1 tablet (400 mg total) by mouth daily.   meclizine  25 MG tablet Commonly known as: ANTIVERT  Take 1 tablet (25 mg total) by mouth 3 (three) times daily as needed for dizziness.   methocarbamol  500 MG tablet Commonly known as: ROBAXIN  TAKE 1 TABLET BY MOUTH FOUR TIMES DAILY *NEW PRESCRIPTION REQUEST* What changed: See the new instructions.   pantoprazole  40 MG tablet Commonly known as: PROTONIX  Take 1 tablet (40 mg total) by mouth daily.   tamsulosin  0.4  MG Caps capsule Commonly known as: FLOMAX  Take 1 capsule (0.4 mg total) by mouth daily.       No Known Allergies  Contact information for follow-up providers     Johnson, Laymon HERO, PA-C Follow up.   Specialties: Cardiology, Cardiology Why: Hospital follow-up with Cardiology scheduled for 12/31/2023 at 2:30pm. Please arrive 15 mintues early for check-in. If this date/ time does not work for you, please call our office to reschedule. Contact information: [address] [place] [address] [phone]         Cook, Jayce G, DO. Schedule an appointment as soon as possible for a visit in 2 week(s).   Specialty: Family Medicine Contact information: [address] [place] [address] [phone]              Contact information for after-discharge care     Home Medical Care     [facility]) .   Service: Home Health Services Contact information: [address] [place]  [address] [phone]                      The results of significant diagnostics from this hospitalization (including imaging, microbiology, ancillary and laboratory) are listed below for  reference.    Significant Diagnostic Studies: DG CHEST PORT 1 VIEW Result Date: 12/12/2023 EXAM: 1 VIEW(S) XRAY OF THE CHEST 12/12/2023 09:42:00 AM COMPARISON: Comparison 05/14/2023. CLINICAL HISTORY: Oxygen desaturation. FINDINGS: LINES, TUBES AND DEVICES: Stable left internal jugular port-a-cath. LUNGS AND PLEURA: Stable right apical thickening. No pleural effusion. No pneumothorax. HEART AND MEDIASTINUM: No acute abnormality of the cardiac and mediastinal silhouettes. BONES AND SOFT TISSUES: No acute osseous abnormality. IMPRESSION: 1. Stable right apical pleural-parenchymal scarring, unchanged from prior exam. Electronically signed by: Lynwood Seip MD 12/12/2023 11:22 AM EST RP Workstation: [id number]   EP STUDY Result Date: 12/11/2023 See surgical note for result.  ECHOCARDIOGRAM COMPLETE Result Date: 12/10/2023    ECHOCARDIOGRAM REPORT   Patient Name:   Darryl Ward Date of Exam: 12/10/2023 Medical Rec #:  [id number]       Height:       70.0 in Accession #:    [id number]      Weight:       185.2 lb Date of Birth:  11/27/1945        BSA:          2.020 m Patient Age:    78 years        BP:           126/102 mmHg Patient Gender: M               HR:           94 bpm. Exam Location:  Inpatient Procedure: 2D Echo (Both Spectral and Color Flow Doppler were utilized during            procedure). Indications:    Hypotension  History:        Patient has prior history of Echocardiogram examinations. CAD,                 COPD; Risk Factors:Hypertension.  Sonographer:    Charmaine Gaskins Referring Phys: [id number] H AZOBOU TONLEU  Sonographer Comments: Technically challenging study due to limited acoustic windows, Technically difficult study due to poor echo windows and no subcostal window. Image acquisition challenging due to respiratory motion and Supine position. IMPRESSIONS  1. Left ventricular ejection  fraction, by estimation, is 40 to 45%. The left ventricle has mildly decreased function. The left  ventricle demonstrates global hypokinesis. Left ventricular diastolic parameters are indeterminate.  2. Right ventricular systolic function is moderately reduced. The right ventricular size is normal.  3. Left atrial size was moderately dilated.  4. Right atrial size was mildly dilated.  5. The mitral valve is normal in structure. Trivial mitral valve regurgitation. No evidence of mitral stenosis.  6. The aortic valve is tricuspid. There is mild calcification of the aortic valve. Aortic valve regurgitation is not visualized. No aortic stenosis is present. FINDINGS  Left Ventricle: Left ventricular ejection fraction, by estimation, is 40 to 45%. The left ventricle has mildly decreased function. The left ventricle demonstrates global hypokinesis. The left ventricular internal cavity size was normal in size. There is  no left ventricular hypertrophy. Left ventricular diastolic parameters are indeterminate. Right Ventricle: The right ventricular size is normal. No increase in right ventricular wall thickness. Right ventricular systolic function is moderately reduced. Left Atrium: Left atrial size was moderately dilated. Right Atrium: Right atrial size was mildly dilated. Pericardium: There is no evidence of pericardial effusion. Mitral Valve: The mitral valve is normal in structure. Trivial mitral valve regurgitation. No evidence of mitral valve stenosis. Tricuspid Valve: The tricuspid valve is normal in structure. Tricuspid valve regurgitation is mild . No evidence of tricuspid stenosis. Aortic Valve: The aortic valve is tricuspid. There is mild calcification of the aortic valve. Aortic valve regurgitation is not visualized. No aortic stenosis is present. Pulmonic Valve: The pulmonic valve was normal in structure. Pulmonic valve regurgitation is mild. No evidence of pulmonic stenosis. Aorta: The aortic root is normal in size and structure. Venous: The inferior vena cava was not well visualized. IAS/Shunts: No atrial  level shunt detected by color flow Doppler.  LEFT VENTRICLE PLAX 2D LVIDd:         5.20 cm   Diastology LVIDs:         4.00 cm   LV e' medial:    9.67 cm/s LV PW:         1.00 cm   LV E/e' medial:  9.2 LV IVS:        1.00 cm   LV e' lateral:   8.83 cm/s LVOT diam:     2.10 cm   LV E/e' lateral: 10.1 LVOT Area:     3.46 cm  RIGHT VENTRICLE RV Basal diam:  2.60 cm RV Mid diam:    2.40 cm RV S prime:     10.30 cm/s LEFT ATRIUM             Index LA diam:        3.70 cm 1.83 cm/m LA Vol (A2C):   62.4 ml 30.89 ml/m LA Vol (A4C):   82.5 ml 40.84 ml/m LA Biplane Vol: 78.4 ml 38.81 ml/m   AORTA Ao Root diam: 3.70 cm Ao Asc diam:  3.60 cm MITRAL VALVE               TRICUSPID VALVE MV Area (PHT): 4.58 cm    TR Peak grad:   21.2 mmHg MV E velocity: 88.73 cm/s  TR Vmax:        230.00 cm/s                             SHUNTS  Systemic Diam: 2.10 cm Toribio Fuel MD Electronically signed by Toribio Fuel MD Signature Date/Time: 12/10/2023/2:50:42 PM    Final    CT ABDOMEN PELVIS WO CONTRAST Result Date: 12/07/2023 CLINICAL DATA:  Abdominal pain and vomiting 3 days. History of lung cancer. EXAM: CT ABDOMEN AND PELVIS WITHOUT CONTRAST TECHNIQUE: Multidetector CT imaging of the abdomen and pelvis was performed following the standard protocol without IV contrast. RADIATION DOSE REDUCTION: This exam was performed according to the departmental dose-optimization program which includes automated exposure control, adjustment of the mA and/or kV according to patient size and/or use of iterative reconstruction technique. COMPARISON:  CT chest 09/10/2023 and PET-CT 06/22/2021 FINDINGS: Lower chest: Heart is normal size. Calcified plaque over the left main and 3 vessel coronary arteries. Calcified plaque over the descending thoracic aorta. Visualized lung bases demonstrate no acute findings. Thin walled cyst over the left lower lobe unchanged. Minimal wall thickening of the distal esophagus which may be  due to reflux esophagitis. Hepatobiliary: Mild cholelithiasis. Liver and biliary tree are normal. Pancreas: Normal. Spleen: Normal Adrenals/Urinary Tract: Adrenal glands are normal. Kidneys are normal in size. There is no hydronephrosis. Multiple calcifications over the kidneys most of which are likely vascular. Few small bilateral renal cortical hypodensities without significant change likely cysts. Ureters and bladder are normal. Stomach/Bowel: Stomach and small bowel are normal. Appendix is normal. Colon is unremarkable. 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 Just below the SMA, the aorta measures 3.9 x 4 cm without significant change. Remaining vascular structures are unremarkable. No adenopathy. Reproductive: Prostate is unremarkable. Other: No free fluid or focal inflammatory change. Small midline epigastric ventral hernia containing only peritoneal fat. Musculoskeletal: Right hip arthroplasty intact. Posterior fusion hardware intact at the L5-S1 level. No focal bony abnormality or acute process. IMPRESSION: 1. No acute findings in the abdomen/pelvis. 2. Mild cholelithiasis. 3. Bilateral aortofemoral bypass graft unchanged. 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 Just below the SMA, the aorta measures 3.9 x 4 cm without significant change. Recommend vascular consultation if not done previously as well as surveillance with follow-up CT 12 months. 4. Mild uniform wall thickening of the distal esophagus which may be seen with reflux esophagitis. 5. Small midline epigastric ventral hernia containing only peritoneal fat. 6. Aortic atherosclerosis. Atherosclerotic coronary artery disease. Aortic Atherosclerosis (ICD10-I70.0).  Electronically Signed   By: Toribio Agreste M.D.   On: 12/07/2023 17:47    Microbiology: Recent Results (from the past 240 hours)  Resp panel by RT-PCR (RSV, Flu A&B, Covid) Anterior Nasal Swab     Status: None   Collection Time: 12/07/23  3:43 PM   Specimen: Anterior Nasal Swab  Result Value Ref Range Status   SARS Coronavirus 2 by RT PCR NEGATIVE NEGATIVE Final    Comment: (NOTE) SARS-CoV-2 target nucleic acids are NOT DETECTED.  The SARS-CoV-2 RNA is generally detectable in upper respiratory specimens during the acute phase of infection. The lowest concentration of SARS-CoV-2 viral copies this assay can detect is 138 copies/mL. A negative result does not preclude SARS-Cov-2 infection and should not be used as the sole basis for treatment or other patient management decisions. A negative result may occur with  improper specimen collection/handling, submission of specimen other than nasopharyngeal swab, presence of viral mutation(s) within  the areas targeted by this assay, and inadequate number of viral copies(<138 copies/mL). A negative result must be combined with clinical observations, patient history, and epidemiological information. The expected result is Negative.  Fact Sheet for Patients:  bloggercourse.com  Fact Sheet for Healthcare Providers:  seriousbroker.it  This test is no t yet approved or cleared by the [place]  FDA and  has been authorized for detection and/or diagnosis of SARS-CoV-2 by FDA under an Emergency Use Authorization (EUA). This EUA will remain  in effect (meaning this test can be used) for the duration of the COVID-19 declaration under Section 564(b)(1) of the Act, 21 U.S.C.section 360bbb-3(b)(1), unless the authorization is terminated  or revoked sooner.       Influenza A by PCR NEGATIVE NEGATIVE Final   Influenza B by PCR NEGATIVE NEGATIVE Final    Comment: (NOTE) The Xpert Xpress  SARS-CoV-2/FLU/RSV plus assay is intended as an aid in the diagnosis of influenza from Nasopharyngeal swab specimens and should not be used as a sole basis for treatment. Nasal washings and aspirates are unacceptable for Xpert Xpress SARS-CoV-2/FLU/RSV testing.  Fact Sheet for Patients: bloggercourse.com  Fact Sheet for Healthcare Providers: seriousbroker.it  This test is not yet approved or cleared by the [place]  FDA and has been authorized for detection and/or diagnosis of SARS-CoV-2 by FDA under an Emergency Use Authorization (EUA). This EUA will remain in effect (meaning this test can be used) for the duration of the COVID-19 declaration under Section 564(b)(1) of the Act, 21 U.S.C. section 360bbb-3(b)(1), unless the authorization is terminated or revoked.     Resp Syncytial Virus by PCR NEGATIVE NEGATIVE Final    Comment: (NOTE) Fact Sheet for Patients: bloggercourse.com  Fact Sheet for Healthcare Providers: seriousbroker.it  This test is not yet approved or cleared by the [place]  FDA and has been authorized for detection and/or diagnosis of SARS-CoV-2 by FDA under an Emergency Use Authorization (EUA). This EUA will remain in effect (meaning this test can be used) for the duration of the COVID-19 declaration under Section 564(b)(1) of the Act, 21 U.S.C. section 360bbb-3(b)(1), unless the authorization is terminated or revoked.  Performed at [facility], [address] [place], [place] [address]   Culture, blood (single)     Status: None   Collection Time: 12/07/23  3:51 PM   Specimen: BLOOD  Result Value Ref Range Status   Specimen Description   Final    BLOOD PORTA CATH Performed at [facility], [address], [place], [place] [address]    Special Requests   Final    Blood Culture adequate volume Performed at  [facility], [address] [place], [place] [address]    Culture   Final    NO GROWTH 5 DAYS Performed at [facility], [address] [place], [place] [address]    Report Status 12/12/2023 FINAL  Final  Culture, blood (single)     Status: None   Collection Time: 12/07/23  5:45 PM   Specimen: BLOOD  Result Value Ref Range Status   Specimen Description   Final    BLOOD RIGHT ANTECUBITAL Performed at [facility], [address], [place], [place] [address]    Special Requests   Final    Blood Culture adequate volume Performed at [facility], [address], [place], [place] [address]    Culture   Final    NO GROWTH 5 DAYS Performed at [facility], [address], [place], [place] [address]    Report Status 12/12/2023 FINAL  Final  MRSA Next Gen by PCR, Nasal     Status: None   Collection Time: 12/07/23  9:09 PM   Specimen: Nasal Mucosa; Nasal Swab  Result Value Ref Range Status   MRSA by PCR Next Gen NOT DETECTED NOT DETECTED Final    Comment: (NOTE) The GeneXpert MRSA Assay (FDA approved for NASAL specimens only), is one component of a comprehensive MRSA colonization surveillance program. It is not intended to diagnose MRSA infection nor to guide or monitor treatment for MRSA infections. Test performance is not FDA approved in patients less than 10 years old. Performed at [facility], [address], [place], [place] [address]      Labs: Basic Metabolic Panel: Recent Labs  Lab 12/09/23 0407 12/10/23 0501 12/11/23 0458 12/12/23 0417 12/13/23 0524  NA 136 139 139 139 137  K 3.9 3.9 3.8 3.7 3.8  CL 103 107 109 108 106  CO2 25 24 23 23 24   GLUCOSE 92 80 83 90 101*  BUN 22 18 17 18 16   CREATININE 0.99 1.06 1.01 1.04 0.98  CALCIUM  9.0 8.6* 8.7* 8.7* 8.7*  MG 1.5* 1.9 1.8 2.1 1.9  PHOS  --   --  2.8  --   --    Liver Function Tests: Recent Labs  Lab 12/07/23 1551  12/11/23 0458  AST 24  --   ALT 9  --   ALKPHOS 78  --   BILITOT 1.0  --   PROT 7.4  --   ALBUMIN  4.0 3.0*   Recent Labs  Lab 12/07/23 1551  LIPASE 12   No results for input(s): AMMONIA in the last 168 hours. CBC: Recent Labs  Lab 12/09/23 0407 12/10/23 0501 12/11/23 0458 12/12/23 0417 12/13/23 0524  WBC 9.0 8.1 9.3 8.6 8.5  NEUTROABS 6.5 5.6 6.6  --  5.9  HGB 11.5* 11.3* 11.5* 10.8* 10.2*  HCT 34.1* 33.5* 34.3* 32.6* 31.0*  MCV 94.5 94.6 95.3 95.3 95.1  PLT 148* 147* 161 142* 137*   Cardiac Enzymes: Recent Labs  Lab 12/07/23 1551  CKTOTAL 218   BNP: BNP (last 3 results) No results for input(s): BNP in the last 8760 hours.  ProBNP (last 3 results) Recent Labs    12/07/23 1551  PROBNP 617.0*    CBG: Recent Labs  Lab 12/09/23 0832 12/10/23 0741 12/11/23 0727 12/12/23 0815 12/13/23 0739  GLUCAP 88 77 82 91 91       Signed:  Toribio Hummer MD.  [facility] 12/13/2023, 9:48 AM

## 2023-12-13 NOTE — Evaluation (Signed)
 Occupational Therapy Evaluation Patient Details Name: Darryl Ward MRN: [id number] DOB: Apr 06, 1945 Today's Date: 12/13/2023   History of Present Illness   Darryl Ward is a 78 y.o. year old male presenting to the ED 12/07/23 with malaise and fatigue after GI symptoms, in A fib with RVR. PMH: hypertension, hyperlipidemia complicated by CAD and PAD, paroxysmal A-fib, CHF , COPD and history of lung cancer, R  ORIF for periprosthetic  femur fracture April 2025.     Clinical Impressions PTA, patient lives with partner at home and was receiving some assist with amb with RW and ADL's with [facility] therapy s/p R femoral fx in April 2025.  Currently, patient presents with deficits outlined below (see OT Problem List for details) most significantly mild pain, decreased activity tolerance and balance with higher level cognitive deficits impacting BADL's and functional mobility. HR remained in the 80's SpO2 98% on RA and BP 129/62 with TED hose in place post amb 75 ft in hallway with RW. Recommending caregiver assist and support with HHOT services upon discharge from hospital. Patient requires continued Acute care hospital level OT services to progress safety and functional performance and allow for discharge.         If plan is discharge home, recommend the following:   A lot of help with walking and/or transfers;A lot of help with bathing/dressing/bathroom;Assistance with cooking/housework;Direct supervision/assist for medications management;Direct supervision/assist for financial management;Assist for transportation;Help with stairs or ramp for entrance;Supervision due to cognitive status     Functional Status Assessment   Patient has had a recent decline in their functional status and demonstrates the ability to make significant improvements in function in a reasonable and predictable amount of time.     Equipment Recommendations   None recommended by OT       Precautions/Restrictions   Precautions Precautions: Fall Recall of Precautions/Restrictions: Intact Restrictions Weight Bearing Restrictions Per Provider Order: No     Mobility Bed Mobility Overal bed mobility: Needs Assistance Bed Mobility: Sit to Supine     Supine to sit: Min assist, HOB elevated, Used rails Sit to supine: Independent   General bed mobility comments: assistance to move to sitting upright    Transfers Overall transfer level: Needs assistance Equipment used: Rolling walker (2 wheels) Transfers: Sit to/from Stand Sit to Stand: Contact guard assist           General transfer comment: VCs hand placement      Balance Overall balance assessment: History of Falls, Needs assistance Sitting-balance support: Feet supported, No upper extremity supported Sitting balance-Leahy Scale: Good     Standing balance support: Reliant on assistive device for balance, During functional activity, Bilateral upper extremity supported Standing balance-Leahy Scale: Fair                             ADL either performed or assessed with clinical judgement   ADL Overall ADL's : Needs assistance/impaired Eating/Feeding: Modified independent;Sitting   Grooming: Modified independent;Sitting;Wash/dry hands;Wash/dry face;Oral care   Upper Body Bathing: Set up;Sitting   Lower Body Bathing: Moderate assistance;Sit to/from stand   Upper Body Dressing : Sitting;Set up   Lower Body Dressing: Moderate assistance;Sit to/from stand   Toilet Transfer: Contact guard assist;BSC/3in1;Rolling walker (2 wheels)   Toileting- Clothing Manipulation and Hygiene: Contact guard assist;Sitting/lateral lean       Functional mobility during ADLs: Contact guard assist (cues for safety) General ADL Comments: decreased functional reach to LE's  Vision Baseline Vision/History: 0 No visual deficits              Pertinent Vitals/Pain Pain Assessment Pain Assessment:  Faces Faces Pain Scale: Hurts a little bit Pain Location: right hip Pain Descriptors / Indicators: Discomfort Pain Intervention(s): Monitored during session, Repositioned     Extremity/Trunk Assessment Upper Extremity Assessment Upper Extremity Assessment: Overall WFL for tasks assessed;Right hand dominant   Lower Extremity Assessment RLE Deficits / Details: leg appears more adducted ,   Cervical / Trunk Assessment Cervical / Trunk Assessment: Normal   Communication Communication Communication: Impaired Factors Affecting Communication: Hearing impaired   Cognition Arousal: Alert Behavior During Therapy: WFL for tasks assessed/performed Cognition: Cognition impaired     Awareness: Intellectual awareness impaired, Online awareness impaired Memory impairment (select all impairments): Short-term memory Attention impairment (select first level of impairment): Sustained attention Executive functioning impairment (select all impairments): Problem solving OT - Cognition Comments: decreased insight and judgement                 Following commands: Intact       Cueing  General Comments   Cueing Techniques: Verbal cues  HR remained in the 80's SpO2 98% on RA and BP 129/62 with TED hose in place post amb 75 ft in hallway with RW           Home Living Family/patient expects to be discharged to:: Private residence Living Arrangements: Spouse/significant other Available Help at Discharge: Available 24 hours/day;Other (Comment) Type of Home: House Home Access: Ramped entrance     Home Layout: One level     Bathroom Shower/Tub: Chief Strategy Officer: Standard Bathroom Accessibility: Yes How Accessible: Accessible via walker Home Equipment: Rolling Walker (2 wheels);Shower seat;Cane - quad;BSC/3in1   Additional Comments: Jerel roommate of 40 years is who the pt has at home for support. patient reports that he has been in rehab x 5 months, has HHPT 2  x week      Prior Functioning/Environment Prior Level of Function : Needs assist             Mobility Comments: uses RW ADLs Comments: assist with showers and IADL' s    OT Problem List: Decreased activity tolerance;Impaired balance (sitting and/or standing);Decreased cognition;Decreased safety awareness;Decreased knowledge of use of DME or AE;Decreased knowledge of precautions;Cardiopulmonary status limiting activity;Pain   OT Treatment/Interventions: Self-care/ADL training;Therapeutic exercise;Neuromuscular education;Energy conservation;DME and/or AE instruction;Therapeutic activities;Cognitive remediation/compensation;Patient/family education;Balance training      OT Goals(Current goals can be found in the care plan section)   Acute Rehab OT Goals Patient Stated Goal: to go home OT Goal Formulation: With patient Time For Goal Achievement: 12/15/23 Potential to Achieve Goals: Good ADL Goals Pt Will Perform Lower Body Bathing: with min assist;with adaptive equipment;sit to/from stand Pt Will Perform Lower Body Dressing: with min assist;with adaptive equipment;sit to/from stand Pt Will Transfer to Toilet: with contact guard assist;regular height toilet;ambulating Pt Will Perform Toileting - Clothing Manipulation and hygiene: with supervision;sitting/lateral leans Pt Will Perform Tub/Shower Transfer: with min assist;Shower transfer;shower seat;rolling walker   OT Frequency:  Min 2X/week       AM-PAC OT 6 Clicks Daily Activity     Outcome Measure Help from another person eating meals?: None Help from another person taking care of personal grooming?: A Little Help from another person toileting, which includes using toliet, bedpan, or urinal?: A Lot Help from another person bathing (including washing, rinsing, drying)?: A Lot Help from another person to put on  and taking off regular upper body clothing?: A Little Help from another person to put on and taking off regular  lower body clothing?: A Lot 6 Click Score: 16   End of Session Equipment Utilized During Treatment: Gait belt;Rolling walker (2 wheels) Nurse Communication: Mobility status  Activity Tolerance: Patient tolerated treatment well Patient left: in chair;with call bell/phone within reach;with chair alarm set;with nursing/sitter in room  OT Visit Diagnosis: Unsteadiness on feet (R26.81);Other abnormalities of gait and mobility (R26.89);History of falling (Z91.81);Pain;Cognitive communication deficit (R41.841) Pain - Right/Left: Right Pain - part of body: Hip                Time: 9069-9043 OT Time Calculation (min): 26 min Charges:  OT General Charges $OT Visit: 1 Visit OT Evaluation $OT Eval Low Complexity: 1 Low OT Treatments $Therapeutic Activity: 8-22 mins  Laquasia Pincus OT/L Acute Rehabilitation Department  [phone]  12/13/2023, 10:08 AM

## 2023-12-13 NOTE — TOC Transition Note (Signed)
 Transition of Care [facility]) - Discharge Note   Patient Details  Name: Darryl Ward MRN: [id number] Date of Birth: 1945/05/23  [facility] CM/SW Contact:  NORMAN ASPEN, LCSW Phone Number: 12/13/2023, 9:24 AM   Clinical Narrative:     Alerted that pt likely to be medically cleared for dc today.  Alerted [facility] that orders for HHPT resumption are in.  Pt has needed DME.  No further IP CM needs.  Final next level of care: Home w Home Health Services Barriers to Discharge: Barriers Resolved   Patient Goals and CMS Choice Patient states their goals for this hospitalization and ongoing recovery are:: To return home with [facility] services CMS Medicare.gov Compare Post Acute Care list provided to:: Patient Choice offered to / list presented to : Patient [place] ownership interest in [facility].provided to:: Patient    Discharge Placement                       Discharge Plan and Services Additional resources added to the After Visit Summary for   In-house Referral: NA Discharge Planning Services: CM Consult Post Acute Care Choice: Durable Medical Equipment, Home Health          DME Arranged: N/A DME Agency: NA       HH Arranged: PT HH Agency: [facility] Date [facility] Agency Contacted: 12/09/23 Time HH Agency Contacted: 1555 Representative spoke with at [facility] Agency: Accepted in the HUB  Social Drivers of Health (SDOH) Interventions SDOH Screenings   Food Insecurity: No Food Insecurity (12/07/2023)  Housing: Low Risk  (12/07/2023)  Transportation Needs: No Transportation Needs (12/07/2023)  Utilities: Not At Risk (12/07/2023)  Alcohol Screen: Low Risk  (08/30/2022)  Depression (PHQ2-9): Medium Risk (11/28/2023)  Financial Resource Strain: High Risk (08/30/2022)  Physical Activity: Sufficiently Active (11/28/2023)  Social Connections: Moderately Isolated (12/07/2023)  Stress: No Stress Concern Present (11/28/2023)  Tobacco Use: Medium Risk  (12/07/2023)  Health Literacy: Adequate Health Literacy (11/28/2023)     Readmission Risk Interventions    12/09/2023    3:52 PM 03/13/2023   11:54 AM  Readmission Risk Prevention Plan  Transportation Screening Complete Complete  PCP or Specialist Appt within 5-7 Days Complete   Home Care Screening Complete   Medication Review (RN CM) Complete   HRI or Home Care Consult  Complete  Social Work Consult for Recovery Care Planning/Counseling  Complete  Palliative Care Screening  Not Applicable  Medication Review Oceanographer)  Complete

## 2023-12-15 ENCOUNTER — Telehealth: Payer: Self-pay

## 2023-12-15 NOTE — Transitions of Care (Post Inpatient/ED Visit) (Signed)
   12/15/2023  Name: Darryl Ward MRN: [id number] DOB: 30-Oct-1945  Today's TOC FU Call Status: Today's TOC FU Call Status:: Unsuccessful Call (1st Attempt) Unsuccessful Call (1st Attempt) Date: 12/15/23  Attempted to reach the patient regarding the most recent Inpatient/ED visit.  A lady answered the phone and states patient is asleep. Will call back at a later time.   Follow Up Plan: Additional outreach attempts will be made to reach the patient to complete the Transitions of Care (Post Inpatient/ED visit) call.   Alan Ee, RN, BSN, CEN [employer]- Transition of Care Team.  [facility] [phone]

## 2023-12-16 ENCOUNTER — Other Ambulatory Visit: Payer: Self-pay

## 2023-12-16 ENCOUNTER — Telehealth: Payer: Self-pay | Admitting: *Deleted

## 2023-12-16 ENCOUNTER — Ambulatory Visit (INDEPENDENT_AMBULATORY_CARE_PROVIDER_SITE_OTHER): Admitting: Family Medicine

## 2023-12-16 ENCOUNTER — Inpatient Hospital Stay (HOSPITAL_COMMUNITY)
Admission: EM | Admit: 2023-12-16 | Discharge: 2023-12-17 | DRG: 309 | Disposition: A | Discharge status: Home Health | Attending: Family Medicine | Admitting: Family Medicine

## 2023-12-16 ENCOUNTER — Encounter: Payer: Self-pay | Admitting: Family Medicine

## 2023-12-16 ENCOUNTER — Encounter (HOSPITAL_COMMUNITY): Payer: Self-pay | Admitting: Emergency Medicine

## 2023-12-16 VITALS — BP 108/69 | HR 88 | Ht 70.0 in | Wt 179.0 lb

## 2023-12-16 DIAGNOSIS — C3411 Malignant neoplasm of upper lobe, right bronchus or lung: Secondary | ICD-10-CM | POA: Diagnosis present

## 2023-12-16 DIAGNOSIS — I4891 Unspecified atrial fibrillation: Secondary | ICD-10-CM | POA: Diagnosis not present

## 2023-12-16 DIAGNOSIS — I5042 Chronic combined systolic (congestive) and diastolic (congestive) heart failure: Secondary | ICD-10-CM | POA: Diagnosis not present

## 2023-12-16 DIAGNOSIS — Z9221 Personal history of antineoplastic chemotherapy: Secondary | ICD-10-CM

## 2023-12-16 DIAGNOSIS — I110 Hypertensive heart disease with heart failure: Secondary | ICD-10-CM | POA: Diagnosis present

## 2023-12-16 DIAGNOSIS — I77819 Aortic ectasia, unspecified site: Secondary | ICD-10-CM | POA: Diagnosis present

## 2023-12-16 DIAGNOSIS — Z7901 Long term (current) use of anticoagulants: Secondary | ICD-10-CM

## 2023-12-16 DIAGNOSIS — Z79899 Other long term (current) drug therapy: Secondary | ICD-10-CM

## 2023-12-16 DIAGNOSIS — Z981 Arthrodesis status: Secondary | ICD-10-CM

## 2023-12-16 DIAGNOSIS — I2510 Atherosclerotic heart disease of native coronary artery without angina pectoris: Secondary | ICD-10-CM | POA: Diagnosis not present

## 2023-12-16 DIAGNOSIS — I252 Old myocardial infarction: Secondary | ICD-10-CM

## 2023-12-16 DIAGNOSIS — Z96641 Presence of right artificial hip joint: Secondary | ICD-10-CM | POA: Diagnosis present

## 2023-12-16 DIAGNOSIS — D630 Anemia in neoplastic disease: Secondary | ICD-10-CM | POA: Diagnosis present

## 2023-12-16 DIAGNOSIS — N179 Acute kidney failure, unspecified: Secondary | ICD-10-CM | POA: Diagnosis present

## 2023-12-16 DIAGNOSIS — I10 Essential (primary) hypertension: Secondary | ICD-10-CM | POA: Diagnosis present

## 2023-12-16 DIAGNOSIS — Z823 Family history of stroke: Secondary | ICD-10-CM

## 2023-12-16 DIAGNOSIS — K219 Gastro-esophageal reflux disease without esophagitis: Secondary | ICD-10-CM | POA: Diagnosis present

## 2023-12-16 DIAGNOSIS — Z8673 Personal history of transient ischemic attack (TIA), and cerebral infarction without residual deficits: Secondary | ICD-10-CM

## 2023-12-16 DIAGNOSIS — J449 Chronic obstructive pulmonary disease, unspecified: Secondary | ICD-10-CM | POA: Diagnosis present

## 2023-12-16 DIAGNOSIS — Z822 Family history of deafness and hearing loss: Secondary | ICD-10-CM

## 2023-12-16 DIAGNOSIS — E785 Hyperlipidemia, unspecified: Secondary | ICD-10-CM | POA: Diagnosis present

## 2023-12-16 DIAGNOSIS — Z87891 Personal history of nicotine dependence: Secondary | ICD-10-CM

## 2023-12-16 DIAGNOSIS — I739 Peripheral vascular disease, unspecified: Secondary | ICD-10-CM | POA: Diagnosis present

## 2023-12-16 DIAGNOSIS — I4819 Other persistent atrial fibrillation: Principal | ICD-10-CM | POA: Diagnosis present

## 2023-12-16 DIAGNOSIS — Z902 Acquired absence of lung [part of]: Secondary | ICD-10-CM

## 2023-12-16 DIAGNOSIS — Z8249 Family history of ischemic heart disease and other diseases of the circulatory system: Secondary | ICD-10-CM

## 2023-12-16 DIAGNOSIS — Z7951 Long term (current) use of inhaled steroids: Secondary | ICD-10-CM

## 2023-12-16 DIAGNOSIS — I714 Abdominal aortic aneurysm, without rupture, unspecified: Secondary | ICD-10-CM | POA: Diagnosis present

## 2023-12-16 LAB — CBC WITH DIFFERENTIAL/PLATELET
Abs Immature Granulocytes: 0.03 K/uL (ref 0.00–0.07)
Basophils Absolute: 0.1 K/uL (ref 0.0–0.1)
Basophils Relative: 1 %
Eosinophils Absolute: 0.1 K/uL (ref 0.0–0.5)
Eosinophils Relative: 1 %
HCT: 35.6 % — ABNORMAL LOW (ref 39.0–52.0)
Hemoglobin: 12 g/dL — ABNORMAL LOW (ref 13.0–17.0)
Immature Granulocytes: 0 %
Lymphocytes Relative: 13 %
Lymphs Abs: 1 K/uL (ref 0.7–4.0)
MCH: 32.3 pg (ref 26.0–34.0)
MCHC: 33.7 g/dL (ref 30.0–36.0)
MCV: 96 fL (ref 80.0–100.0)
Monocytes Absolute: 0.7 K/uL (ref 0.1–1.0)
Monocytes Relative: 8 %
Neutro Abs: 6.3 K/uL (ref 1.7–7.7)
Neutrophils Relative %: 77 %
Platelets: 168 K/uL (ref 150–400)
RBC: 3.71 MIL/uL — ABNORMAL LOW (ref 4.22–5.81)
RDW: 14.5 % (ref 11.5–15.5)
WBC: 8.2 K/uL (ref 4.0–10.5)
nRBC: 0 % (ref 0.0–0.2)

## 2023-12-16 LAB — COMPREHENSIVE METABOLIC PANEL WITH GFR
ALT: 12 U/L (ref 0–44)
AST: 24 U/L (ref 15–41)
Albumin: 3.6 g/dL (ref 3.5–5.0)
Alkaline Phosphatase: 67 U/L (ref 38–126)
Anion gap: 9 (ref 5–15)
BUN: 16 mg/dL (ref 8–23)
CO2: 24 mmol/L (ref 22–32)
Calcium: 9.1 mg/dL (ref 8.9–10.3)
Chloride: 104 mmol/L (ref 98–111)
Creatinine, Ser: 1.28 mg/dL — ABNORMAL HIGH (ref 0.61–1.24)
GFR, Estimated: 58 mL/min — ABNORMAL LOW (ref >60)
Glucose, Bld: 111 mg/dL — ABNORMAL HIGH (ref 70–99)
Potassium: 4 mmol/L (ref 3.5–5.1)
Sodium: 137 mmol/L (ref 135–145)
Total Bilirubin: 0.6 mg/dL (ref 0.0–1.2)
Total Protein: 6.5 g/dL (ref 6.5–8.1)

## 2023-12-16 LAB — TSH: TSH: 1.33 u[IU]/mL (ref 0.350–4.500)

## 2023-12-16 LAB — PRO BRAIN NATRIURETIC PEPTIDE: Pro Brain Natriuretic Peptide: 385 pg/mL — ABNORMAL HIGH (ref <300.0)

## 2023-12-16 LAB — MAGNESIUM: Magnesium: 2 mg/dL (ref 1.7–2.4)

## 2023-12-16 MED ORDER — SODIUM CHLORIDE 0.9 % IV BOLUS
250.0000 mL | Freq: Once | INTRAVENOUS | Status: AC
  Administered 2023-12-16: 250 mL via INTRAVENOUS

## 2023-12-16 MED ORDER — AMIODARONE HCL IN DEXTROSE 360-4.14 MG/200ML-% IV SOLN
60.0000 mg/h | INTRAVENOUS | Status: DC
  Administered 2023-12-16 (×2): 60 mg/h via INTRAVENOUS
  Filled 2023-12-16 (×3): qty 200

## 2023-12-16 MED ORDER — ACETAMINOPHEN 650 MG RE SUPP
650.0000 mg | Freq: Four times a day (QID) | RECTAL | Status: DC | PRN
Start: 2023-12-16 — End: 2023-12-17

## 2023-12-16 MED ORDER — ACETAMINOPHEN 325 MG PO TABS: 650.0000 mg | ORAL_TABLET | Freq: Four times a day (QID) | ORAL | Status: DC | PRN

## 2023-12-16 MED ORDER — CHLORHEXIDINE GLUCONATE CLOTH 2 % EX PADS
6.0000 | MEDICATED_PAD | Freq: Every day | CUTANEOUS | Status: DC
  Administered 2023-12-16 – 2023-12-17 (×2): 6 via TOPICAL

## 2023-12-16 MED ORDER — AMIODARONE LOAD VIA INFUSION
150.0000 mg | Freq: Once | INTRAVENOUS | Status: AC
  Administered 2023-12-16: 150 mg via INTRAVENOUS
  Filled 2023-12-16: qty 83.34

## 2023-12-16 MED ORDER — APIXABAN 5 MG PO TABS
5.0000 mg | ORAL_TABLET | Freq: Two times a day (BID) | ORAL | Status: DC
  Administered 2023-12-16 – 2023-12-17 (×2): 5 mg via ORAL
  Filled 2023-12-16 (×2): qty 1

## 2023-12-16 MED ORDER — POLYETHYLENE GLYCOL 3350 17 G PO PACK: 17.0000 g | PACK | Freq: Every day | ORAL | Status: DC | PRN

## 2023-12-16 MED ORDER — AMIODARONE HCL IN DEXTROSE 360-4.14 MG/200ML-% IV SOLN
30.0000 mg/h | INTRAVENOUS | Status: DC
  Administered 2023-12-17: 30 mg/h via INTRAVENOUS
  Filled 2023-12-16: qty 200

## 2023-12-16 MED ORDER — ONDANSETRON HCL 4 MG/2ML IJ SOLN: 4.0000 mg | Freq: Four times a day (QID) | INTRAMUSCULAR | Status: DC | PRN

## 2023-12-16 MED ORDER — ONDANSETRON HCL 4 MG PO TABS: 4.0000 mg | ORAL_TABLET | Freq: Four times a day (QID) | ORAL | Status: DC | PRN

## 2023-12-16 NOTE — Progress Notes (Signed)
 Subjective:  Patient ID: Darryl Ward, male    DOB: 1945-10-11  Age: 78 y.o. MRN: [id number]  CC:   Chief Complaint  Patient presents with   Hospitalization Follow-up    HPI:  78 year old male presents for hospital follow-up.  Patient recently admitted from 11/16 to 11/22.  Patient presented to the ER with malaise and fatigue.  He had a preceding GI illness.  Patient was found to be in A-fib with RVR and was subsequently admitted.  Patient had a cardioversion on 11/20.  He remained in normal sinus rhythm for the remainder of the hospitalization.  Metoprolol  was discontinued given orthostasis and soft blood pressures.  His other blood pressure medications have been held.  Patient presents today for follow-up. He feels weak and fatigued.  No reports of chest pain.  No shortness of breath.  His GI symptoms has resolved.  He is eating well.  Patient Active Problem List   Diagnosis Date Noted   Gastroenteritis 12/10/2023   Hypokalemia 12/10/2023   Hypomagnesemia 12/10/2023   Atrial fibrillation with rapid ventricular response (HCC) 12/07/2023   History of hip fracture 09/25/2023   Voice hoarseness 06/23/2023   Malnutrition of moderate degree 05/15/2023   Urinary incontinence 03/31/2023   Recurrent falls 03/11/2023   GERD without esophagitis 03/11/2023   Depression 03/11/2023   Benign prostatic hyperplasia with lower urinary tract symptoms 06/13/2022   Normocytic anemia 04/28/2022   Iron deficiency anemia 04/22/2022   History of stroke 03/27/2022   Chronic combined systolic and diastolic CHF (congestive heart failure) (HCC) 03/27/2022   Essential hypertension 09/30/2021   Primary adenocarcinoma of upper lobe of right lung (HCC) 06/26/2021   COPD (chronic obstructive pulmonary disease) (HCC) 05/29/2021   Meningioma, cerebral (HCC) 02/05/2020   Chronic midline low back pain without sciatica 04/27/2019   Tobacco abuse 11/17/2018   PVD (peripheral vascular disease) (HCC) 05/21/2012    Mixed hyperlipidemia 12/13/2011   Coronary atherosclerosis of native coronary artery 12/13/2011    Social Hx   Social History   Socioeconomic History   Marital status: Divorced    Spouse name: Not on file   Number of children: 1   Years of education: 11   Highest education level: 11th grade  Occupational History    Employer: [employer]  Tobacco Use   Smoking status: Former    Current packs/day: 0.00    Average packs/day: 1 pack/day for 40.0 years (40.0 ttl pk-yrs)    Types: Cigarettes    Start date: 07/1981    Quit date: 07/2021    Years since quitting: 2.4   Smokeless tobacco: Never   Tobacco comments:    1 pack of cigarettes smoked daily. 07/17/21 ARJ, RN   Vaping Use   Vaping status: Never Used  Substance and Sexual Activity   Alcohol use: No    Comment: Prior history of regular alcohol use   Drug use: No   Sexual activity: Not Currently  Other Topics Concern   Not on file  Social History Narrative   Not on file   Social Drivers of Health   Financial Resource Strain: Medium Risk (12/15/2023)   Overall Financial Resource Strain (CARDIA)    Difficulty of Paying Living Expenses: Somewhat hard  Food Insecurity: No Food Insecurity (12/16/2023)   Hunger Vital Sign    Worried About Running Out of Food in the Last Year: Never true    Ran Out of Food in the Last Year: Never true  Transportation Needs: Unmet Transportation Needs (  12/16/2023)   PRAPARE - Administrator, Civil Service (Medical): Yes    Lack of Transportation (Non-Medical): No  Physical Activity: Inactive (12/15/2023)   Exercise Vital Sign    Days of Exercise per Week: 0 days    Minutes of Exercise per Session: Not on file  Stress: No Stress Concern Present (12/15/2023)   [employer] of Occupational Health - Occupational Stress Questionnaire    Feeling of Stress: Not at all  Social Connections: Socially Isolated (12/15/2023)   Social Connection and Isolation Panel     Frequency of Communication with Friends and Family: Never    Frequency of Social Gatherings with Friends and Family: Never    Attends Religious Services: Never    Database Administrator or Organizations: No    Attends Engineer, Structural: Not on file    Marital Status: Divorced    Review of Systems Per HPI  Objective:  BP 108/69   Pulse 88   Ht 5' 10 (1.778 m)   Wt 179 lb (81.2 kg)   SpO2 96%   BMI 25.68 kg/m      12/16/2023    3:55 PM 12/16/2023    3:53 PM 12/16/2023    2:13 PM  BP/Weight  Systolic BP 133  891  Diastolic BP 81  69  Wt. (Lbs)  179.01 179  BMI  25.69 kg/m2 25.68 kg/m2    Physical Exam Vitals and nursing note reviewed.  Constitutional:      General: He is not in acute distress. HENT:     Head: Normocephalic and atraumatic.  Cardiovascular:     Comments: Tachycardic, irregularly irregular Pulmonary:     Effort: Pulmonary effort is normal. No respiratory distress.  Neurological:     Mental Status: He is alert.  Psychiatric:        Mood and Affect: Mood normal.        Behavior: Behavior normal.     Lab Results  Component Value Date   WBC 8.5 12/13/2023   HGB 10.2 (L) 12/13/2023   HCT 31.0 (L) 12/13/2023   PLT 137 (L) 12/13/2023   GLUCOSE 101 (H) 12/13/2023   CHOL 84 03/11/2023   TRIG 94 03/11/2023   HDL 28 (L) 03/11/2023   LDLCALC 37 03/11/2023   ALT 9 12/07/2023   AST 24 12/07/2023   NA 137 12/13/2023   K 3.8 12/13/2023   CL 106 12/13/2023   CREATININE 0.98 12/13/2023   BUN 16 12/13/2023   CO2 24 12/13/2023   TSH 2.997 04/03/2023   INR 1.3 (H) 04/27/2022   HGBA1C 5.5 03/10/2023     Assessment & Plan:  Atrial fibrillation with rapid ventricular response (HCC) Assessment & Plan: Patient back in atrial fibrillation the rapid ventricular response at the rate of 147 today.  Discussed case with cardiology.  Ultimately I decided to send him directly to the hospital for rate control and possible repeat cardioversion.   Patient will need readmission.    Jacqulyn Ahle DO [facility]

## 2023-12-16 NOTE — ED Notes (Signed)
 Attempted to call report to ICU. Staff not ready at this time.

## 2023-12-16 NOTE — H&P (Addendum)
 History and Physical    [name] FMW:[id number] DOB: Nov 02, 1945 DOA: 12/16/2023  PCP: Ward, Darryl G, DO   Patient coming from: Home  I have personally briefly reviewed patient's old medical records in [facility]  Chief Complaint: Increased heart rate  HPI: NYCHOLAS Ward is a 78 y.o. male with medical history significant for atrial fibrillation, systolic and diastolic CHF, COPD, hypertension, stroke, hard of hearing Patient went to see his primary care provider for his posthospitalization follow-up visit, he was found to be in atrial fibrillation, PCP talked to cardiology and he was referred to ED for admission. Patient denies any symptoms, he was unaware of his fast heart rate.  Chest pain or difficulty breathing no dizziness.  He otherwise denies any symptoms or problems, he has been doing well since discharge.  Recently hospitalized-11/16 to 11/22 admitted to [facility], for atrial fibrillation with RVR, initially placed on Cardizem  drip, then discontinued, he had low blood pressure requiring IV fluid, subsequent gradual decrease in his beta-blocker dose.  Underwent successful electrical cardioversion 11/20 and remained in sinus rhythm, he was not placed on GDMT due to AKI and low blood pressure.  Per notes his beta-blocker was discontinued on discharge due to intolerance.  He was also managed for AKI, acute gastroenteritis.  ED Course: Temperature 98.7.  Heart rate initially in the 130s, respiratory rate 13-20.  Blood pressure systolic 107-133.  O2 sats greater 92% on room air. Normal TSH-1.33.  Magnesium  2.  proBNP 385.  Potassium 4. 250 bolus given. EDP spoke to cardiology, recommended admission here, amiodarone  load and drip started.  Cardiology team to see here in consult.  Review of Systems: As per HPI all other systems reviewed and negative.  Past Medical History:  Diagnosis Date   AAA (abdominal aortic aneurysm)    Anemia    Arthritis    Blood  transfusion without reported diagnosis 10/2021   Broken hip (HCC)    Cancer (HCC)    Carotid artery disease    Nonobstructive   Cataract    CHF (congestive heart failure) (HCC)    COPD (chronic obstructive pulmonary disease) (HCC)    Coronary atherosclerosis of native coronary artery    PTCA small diagonal 2007 otherwise nonobstructive CAD   Depression    Dysrhythmia    Essential hypertension, benign    GERD (gastroesophageal reflux disease)    Hyperlipidemia    NSTEMI (non-ST elevated myocardial infarction) (HCC) 2007   Stroke [facility]) 2004   Stroke [facility]) 2025   TIA (transient ischemic attack) 2006    Past Surgical History:  Procedure Laterality Date   AORTA - BILATERAL FEMORAL ARTERY BYPASS GRAFT  01/08/2012   Procedure: AORTA BIFEMORAL BYPASS GRAFT;  Surgeon: Carlin FORBES Haddock, MD;  Location: [facility] OR;  Service: Vascular;  Laterality: Bilateral;  using 18x52mm x 40cm Hemashield Gold Vascular Graft with Endarterectomy, Thombectomy and  Reimplantation of Inferior Mesenteric Artery   BACK SURGERY  2021   BRONCHIAL BIOPSY  06/11/2021   Procedure: BRONCHIAL BIOPSIES;  Surgeon: Shelah Lamar RAMAN, MD;  Location: [facility] ENDOSCOPY;  Service: Pulmonary;;   BRONCHIAL BRUSHINGS  06/11/2021   Procedure: BRONCHIAL BRUSHINGS;  Surgeon: Shelah Lamar RAMAN, MD;  Location: [facility] ENDOSCOPY;  Service: Pulmonary;;   BRONCHIAL NEEDLE ASPIRATION BIOPSY  06/11/2021   Procedure: BRONCHIAL NEEDLE ASPIRATION BIOPSIES;  Surgeon: Shelah Lamar RAMAN, MD;  Location: [facility] ENDOSCOPY;  Service: Pulmonary;;   CARDIOVERSION N/A 09/20/2021   Procedure: CARDIOVERSION;  Surgeon: Debera Jayson MATSU, MD;  Location: [facility] ORS;  Service: Cardiovascular;  Laterality: N/A;   CARDIOVERSION N/A 12/11/2023   Procedure: CARDIOVERSION;  Surgeon: Francyne Headland, MD;  Location: [facility] INVASIVE CV LAB;  Service: Cardiovascular;  Laterality: N/A;   COLONOSCOPY N/A 04/14/2019   Procedure: COLONOSCOPY;  Surgeon: Shaaron Lamar HERO, MD;  Location: [facility] ENDO SUITE;   Service: Endoscopy;  Laterality: N/A;  9:30   COLONOSCOPY WITH PROPOFOL  N/A 07/25/2021   Procedure: COLONOSCOPY WITH PROPOFOL ;  Surgeon: Cindie Carlin POUR, DO;  Location: [facility] ENDO SUITE;  Service: Endoscopy;  Laterality: N/A;  1:00pm   EYE SURGERY     FIDUCIAL MARKER PLACEMENT  06/11/2021   Procedure: FIDUCIAL MARKER PLACEMENT;  Surgeon: Shelah Lamar RAMAN, MD;  Location: [facility] ENDOSCOPY;  Service: Pulmonary;;   FRACTURE SURGERY     HIP ARTHROPLASTY Right 04/28/2022   Procedure: ARTHROPLASTY BIPOLAR HIP (HEMIARTHROPLASTY);  Surgeon: Beverley Evalene BIRCH, MD;  Location: [facility] OR;  Service: Orthopedics;  Laterality: Right;   INTERCOSTAL NERVE BLOCK Right 11/12/2021   Procedure: INTERCOSTAL NERVE BLOCK;  Surgeon: Kerrin Elspeth BROCKS, MD;  Location: [facility] OR;  Service: Thoracic;  Laterality: Right;   IR IMAGING GUIDED PORT INSERTION  07/31/2021   JOINT REPLACEMENT     Left cataract surgery     LYMPH NODE DISSECTION Right 11/12/2021   Procedure: LYMPH NODE DISSECTION;  Surgeon: Kerrin Elspeth BROCKS, MD;  Location: [facility] OR;  Service: Thoracic;  Laterality: Right;   POLYPECTOMY  04/14/2019   Procedure: POLYPECTOMY;  Surgeon: Shaaron Lamar HERO, MD;  Location: [facility] ENDO SUITE;  Service: Endoscopy;;   POLYPECTOMY  07/25/2021   Procedure: POLYPECTOMY;  Surgeon: Cindie Carlin POUR, DO;  Location: [facility] ENDO SUITE;  Service: Endoscopy;;   SPINE SURGERY     TEE WITHOUT CARDIOVERSION N/A 09/20/2021   Procedure: TRANSESOPHAGEAL ECHOCARDIOGRAM (TEE);  Surgeon: Debera Jayson MATSU, MD;  Location: [facility] ORS;  Service: Cardiovascular;  Laterality: N/A;   TOTAL HIP ARTHROPLASTY Right 05/15/2023   Procedure: RIGHT HIP HEMIARTHROPLASTY REVISION WITH OPEN REDUCTION INTERNAL FIXATION OF PERIPROSTHETIC FEMUR FRACTURE;  Surgeon: Beverley Evalene BIRCH, MD;  Location: [facility] ORS;  Service: Orthopedics;  Laterality: Right;   TRANSFORAMINAL LUMBAR INTERBODY FUSION (TLIF) WITH PEDICLE SCREW FIXATION 1 LEVEL N/A 04/27/2020   Procedure: Transforaminal Lumbar  Interbody Fusion Lumbar Five-Sacral One;  Surgeon: Debby Dorn MATSU, MD;  Location: [facility] OR;  Service: Neurosurgery;  Laterality: N/A;   VIDEO BRONCHOSCOPY WITH INSERTION OF INTERBRONCHIAL VALVE (IBV) N/A 11/22/2021   Procedure: VIDEO BRONCHOSCOPY WITH INSERTION OF INTERBRONCHIAL VALVE (IBV);  Surgeon: Kerrin Elspeth BROCKS, MD;  Location: [facility] OR;  Service: Thoracic;  Laterality: N/A;   VIDEO BRONCHOSCOPY WITH INSERTION OF INTERBRONCHIAL VALVE (IBV) N/A 01/24/2022   Procedure: VIDEO BRONCHOSCOPY WITH REMOVAL OF INTERBRONCHIAL VALVE (IBV);  Surgeon: Kerrin Elspeth BROCKS, MD;  Location: [facility] OR;  Service: Thoracic;  Laterality: N/A;   VIDEO BRONCHOSCOPY WITH RADIAL ENDOBRONCHIAL ULTRASOUND  06/11/2021   Procedure: VIDEO BRONCHOSCOPY WITH RADIAL ENDOBRONCHIAL ULTRASOUND;  Surgeon: Shelah Lamar RAMAN, MD;  Location: [facility] ENDOSCOPY;  Service: Pulmonary;;     reports that he quit smoking about 2 years ago. His smoking use included cigarettes. He started smoking about 42 years ago. He has a 40 pack-year smoking history. He has never used smokeless tobacco. He reports that he does not drink alcohol and does not use drugs.  No Known Allergies  Family History  Problem Relation Age of Onset   Hearing loss Mother    Miscarriages / Stillbirths Mother    Vision loss Mother  Hyperlipidemia Sister    Hypertension Sister    Stroke Sister    Heart attack Brother 37   Cancer Brother    Hearing loss Brother    Heart disease Brother    Hypertension Brother    Hearing loss Brother    Heart disease Brother    Hypertension Brother    Cancer - Colon Neg Hx     Prior to Admission medications   Medication Sig Start Date End Date Taking? Authorizing Provider  acetaminophen  (TYLENOL ) 325 MG tablet Take 2 tablets (650 mg total) by mouth every 6 (six) hours as needed for mild pain (pain score 1-3) or fever (or Fever >/= 101). 12/13/23   [name] Toribio GAILS, MD  albuterol  (VENTOLIN  HFA) 108 [phone] Base) MCG/ACT inhaler  Inhale 2 puffs into the lungs every 6 (six) hours as needed for wheezing or shortness of breath. 10/10/23   Cobb, Comer GAILS, NP  apixaban  (ELIQUIS ) 5 MG TABS tablet Take 1 tablet (5 mg total) by mouth 2 (two) times daily. 10/01/23   Ward, Darryl G, DO  atorvastatin  (LIPITOR ) 80 MG tablet Take 1 tablet (80 mg total) by mouth daily. 10/01/23   Ward, Darryl G, DO  Cholecalciferol  50 MCG (2000 UT) CAPS Take 1 capsule by mouth daily.    [provider]  escitalopram  (LEXAPRO ) 10 MG tablet Take 1 tablet (10 mg total) by mouth daily. 10/01/23   Ward, Darryl G, DO  feeding supplement (ENSURE PLUS HIGH PROTEIN) LIQD Take 237 mLs by mouth 2 (two) times daily between meals. 12/13/23   [name] Toribio GAILS, MD  fluticasone  (FLONASE ) 50 MCG/ACT nasal spray Place 2 sprays into both nostrils daily. Patient not taking: Reported on 12/16/2023 06/23/23   Malachy Comer GAILS, NP  folic acid  (KP FOLIC ACID ) 1 MG tablet Take 1 tablet (1 mg total) by mouth daily. 10/01/23   Ward, Darryl G, DO  magnesium  oxide (MAG-OX) 400 (240 Mg) MG tablet Take 1 tablet (400 mg total) by mouth daily. 10/01/23   Ward, Darryl G, DO  methocarbamol  (ROBAXIN ) 500 MG tablet TAKE 1 TABLET BY MOUTH FOUR TIMES DAILY *NEW PRESCRIPTION REQUEST* 09/26/23   Ward, Darryl G, DO  metoprolol  succinate (TOPROL -XL) 25 MG 24 hr tablet Take 1 tablet (25 mg total) by mouth daily. Patient not taking: Reported on 12/16/2023 10/01/23   Ward, Darryl G, DO  pantoprazole  (PROTONIX ) 40 MG tablet Take 1 tablet (40 mg total) by mouth daily. 10/01/23   Ward, Darryl G, DO  tamsulosin  (FLOMAX ) 0.4 MG CAPS capsule Take 1 capsule (0.4 mg total) by mouth daily. 10/01/23   Ward, Darryl G, DO  umeclidinium bromide  (INCRUSE ELLIPTA ) 62.5 MCG/ACT AEPB Inhale 1 puff into the lungs daily. Patient taking differently: Inhale 1 puff into the lungs daily as needed (wheezing/sob). 10/10/23   Malachy Comer GAILS, NP    Physical Exam: Vitals:   12/16/23 1811 12/16/23 1844 12/16/23 1847 12/16/23 1848   BP: 113/76  107/88   Pulse: (!) 128 (!) 121 (!) 124 (!) 115  Resp: 20 13 16 14   Temp:      SpO2: 94% 92%  98%  Weight:      Height:        Constitutional: NAD, calm, comfortable Vitals:   12/16/23 1811 12/16/23 1844 12/16/23 1847 12/16/23 1848  BP: 113/76  107/88   Pulse: (!) 128 (!) 121 (!) 124 (!) 115  Resp: 20 13 16 14   Temp:      SpO2: 94% 92%  98%  Weight:      Height:       Eyes: PERRL, lids and conjunctivae normal ENMT: Mucous membranes are moist.   Neck: normal, supple, no masses, no thyromegaly Respiratory: clear to auscultation bilaterally, no wheezing, no crackles. Normal respiratory effort. No accessory muscle use.  Cardiovascular: Tachycardic, irregular rate and rhythm, no murmurs / rubs / gallops. No extremity edema.  Extremities warm.  Port left upper chest. Abdomen: no tenderness, no masses palpated. No hepatosplenomegaly.   Musculoskeletal: no clubbing / cyanosis. No joint deformity upper and lower extremities.  Skin: no rashes, lesions, ulcers. No induration Neurologic: No facial asymmetry, moves extremity spontaneously, speech fluent.SABRA  Psychiatric: Normal judgment and insight. Alert and oriented x 3. Normal mood.   Labs on Admission: I have personally reviewed following labs and imaging studies  CBC: Recent Labs  Lab 12/10/23 0501 12/11/23 0458 12/12/23 0417 12/13/23 0524 12/16/23 1614  WBC 8.1 9.3 8.6 8.5 8.2  NEUTROABS 5.6 6.6  --  5.9 6.3  HGB 11.3* 11.5* 10.8* 10.2* 12.0*  HCT 33.5* 34.3* 32.6* 31.0* 35.6*  MCV 94.6 95.3 95.3 95.1 96.0  PLT 147* 161 142* 137* 168   Basic Metabolic Panel: Recent Labs  Lab 12/10/23 0501 12/11/23 0458 12/12/23 0417 12/13/23 0524 12/16/23 1614  NA 139 139 139 137 137  K 3.9 3.8 3.7 3.8 4.0  CL 107 109 108 106 104  CO2 24 23 23 24 24   GLUCOSE 80 83 90 101* 111*  BUN 18 17 18 16 16   CREATININE 1.06 1.01 1.04 0.98 1.28*  CALCIUM  8.6* 8.7* 8.7* 8.7* 9.1  MG 1.9 1.8 2.1 1.9 2.0  PHOS  --  2.8  --   --    --    GFR: Estimated Creatinine Clearance: 50.7 mL/min (A) (by C-G formula based on SCr of 1.28 mg/dL (H)). Liver Function Tests: Recent Labs  Lab 12/11/23 0458 12/16/23 1614  AST  --  24  ALT  --  12  ALKPHOS  --  67  BILITOT  --  0.6  PROT  --  6.5  ALBUMIN  3.0* 3.6   BNP (last 3 results) Recent Labs    12/07/23 1551 12/16/23 1614  PROBNP 617.0* 385.0*   HbA1C: No results for input(s): HGBA1C in the last 72 hours. CBG: Recent Labs  Lab 12/10/23 0741 12/11/23 0727 12/12/23 0815 12/13/23 0739  GLUCAP 77 82 91 91   Urine analysis:    Component Value Date/Time   COLORURINE YELLOW 12/07/2023 1500   APPEARANCEUR CLEAR 12/07/2023 1500   LABSPEC 1.021 12/07/2023 1500   PHURINE 6.0 12/07/2023 1500   GLUCOSEU NEGATIVE 12/07/2023 1500   HGBUR TRACE (A) 12/07/2023 1500   BILIRUBINUR NEGATIVE 12/07/2023 1500   KETONESUR NEGATIVE 12/07/2023 1500   PROTEINUR NEGATIVE 12/07/2023 1500   UROBILINOGEN 1.0 01/10/2012 1728   NITRITE NEGATIVE 12/07/2023 1500   LEUKOCYTESUR NEGATIVE 12/07/2023 1500    Radiological Exams on Admission: No results found.  EKG: Independently reviewed.  Atrial fibrillation.  Rate 136.  QTc 482.  Assessment/Plan Principal Problem:   Atrial fibrillation with rapid ventricular response (HCC) Active Problems:   COPD (chronic obstructive pulmonary disease) (HCC)   Primary adenocarcinoma of upper lobe of right lung (HCC)   Essential hypertension   History of stroke   Chronic combined systolic and diastolic CHF (congestive heart failure) (HCC)   Dilation of aorta  Assessment and Plan:  Atria Fibrillation with RVR-heart rate up to 137 in the ED. Patient asymptomatic.  Hospitalization for same,  underwent successful cardioversion 11/20.  He has been compliant with Eliquis .  Per notes, metoprolol  was discontinued during last hospitalization, he tells me he is taking it???.  - Last echo 12/10/2023 EF of 40 to 45%, global hypokinesis. - EDP  consulted cardiology, amiodarone  bolus and drip started, okay to stay here at [facility] - Cardiology consult -  Normal TSH-1.33.  Magnesium  2.  Potassium 4. - 250 ml bolus given - Resume Eliquis  - N.p.o. midnight  Chronic systolic and diastolic CHF -stable and compensated.  Not on diuretics.  Hypertension-stable.  Not on antihypertensives.  COPD-stable.  Resume home regimen  Lung cancer-stage II right upper lobe adenocarcinoma.  Underwent right upper lobectomy and lymph node dissection in 2023, with adjuvant pembrolizumab .  Currently under surveillance.  CT Findings 12/07/2022 - dilatation of the infrarenal abdominal aorta above the bypass graft measuring 4.4 cm in greatest AP diameter (previously 4.1 cm in 2023). Just below the SMA, the aorta measures 3.9 x 4 cm without significant change. Recommend vascular consultation if not done previously as well as surveillance with follow-up CT 12 months. - Follow-up as outpatient   DVT prophylaxis: Eliquis  Code Status: Full code Family Communication: None at bedside Disposition Plan: ~ 2 days Consults called: Cardiology Admission status: Inpt Stepdown I certify that at the point of admission it is my clinical judgment that the patient will require inpatient hospital care spanning beyond 2 midnights from the point of admission due to high intensity of service, high risk for further deterioration and high frequency of surveillance required.    CRITICAL CARE Performed by: Tully FORBES Carwin   Total critical care time: 55 minutes  Critical care time was exclusive of separately billable procedures and treating other patients.  Critical care was necessary to treat or prevent imminent or life-threatening deterioration.  Critical care was time spent personally by me on the following activities: development of treatment plan with patient and/or surrogate as well as nursing, discussions with consultants, evaluation of patient's response to  treatment, examination of patient, obtaining history from patient or surrogate, ordering and performing treatments and interventions, ordering and review of laboratory studies, ordering and review of radiographic studies, pulse oximetry and re-evaluation of patient's condition.   Author: Tully FORBES Carwin, MD 12/16/2023 10:31 PM  For on call review [email or web address].

## 2023-12-16 NOTE — ED Provider Notes (Signed)
 [place] [facility]ERGENCY DE[facility]MENT AT [facility] Provider Note   CSN: [id number] Arrival date & time: 12/16/23  1526     Patient presents with: Atrial Fibrillation   Darryl Ward is a 78 y.o. male.    Atrial Fibrillation Pertinent negatives include no chest pain, no abdominal pain and no shortness of breath.   Patient was sent to the ED from his primary care physician office.  Patient was found to be in A-fib with RVR.  Patient states has noticed palpitations since about yesterday.  No chest pain or shortness of breath.  No nausea vomit diarrhea.  Been able tolerate p.o.  Has no complaints other than some episodes of palpitations.  Patient states been compliant with his Eliquis  5 mg twice daily.  Patient is currently not take any kind of rate control agent as below.  Denies chest pain shortness breath nausea vomit diarrhea.  Feels totally fine otherwise.    Previous medical history reviewed : Was discharged on December 13, 2023.  A-fib with RVR.  Soft and low blood pressures during hospital admission which required decreased doses of patient's beta-blocker as well as IV fluids.  Started metoprolol  25 mg twice daily per cardiology.  This was subsequently discontinued during hospitalization because of soft blood pressure.  Maintained on Eliquis  for anticoagulation.  2D echo with EF of 4045%.  Left ventricular global hypokinesis.  Reduced right ventricular systolic function.  No aortic stenosis.  Successful electrical cardioversion on 1120.  Remains normal sinus rhythm.   Prior to Admission medications   Medication Sig Start Date End Date Taking? Authorizing Provider  acetaminophen  (TYLENOL ) 325 MG tablet Take 2 tablets (650 mg total) by mouth every 6 (six) hours as needed for mild pain (pain score 1-3) or fever (or Fever >/= 101). 12/13/23   [name] Toribio GAILS, MD  albuterol  (VENTOLIN  HFA) 108 [phone] Base) MCG/ACT inhaler Inhale 2 puffs into the lungs every 6 (six) hours as needed  for wheezing or shortness of breath. 10/10/23   Cobb, Comer GAILS, NP  apixaban  (ELIQUIS ) 5 MG TABS tablet Take 1 tablet (5 mg total) by mouth 2 (two) times daily. 10/01/23   Cook, Jayce G, DO  atorvastatin  (LIPITOR ) 80 MG tablet Take 1 tablet (80 mg total) by mouth daily. 10/01/23   Cook, Jayce G, DO  Cholecalciferol  50 MCG (2000 UT) CAPS Take 1 capsule by mouth daily.    [provider]  escitalopram  (LEXAPRO ) 10 MG tablet Take 1 tablet (10 mg total) by mouth daily. 10/01/23   Cook, Jayce G, DO  feeding supplement (ENSURE PLUS HIGH PROTEIN) LIQD Take 237 mLs by mouth 2 (two) times daily between meals. 12/13/23   [name] Toribio GAILS, MD  fluticasone  (FLONASE ) 50 MCG/ACT nasal spray Place 2 sprays into both nostrils daily. Patient not taking: Reported on 12/16/2023 06/23/23   Malachy Comer GAILS, NP  folic acid  (KP FOLIC ACID ) 1 MG tablet Take 1 tablet (1 mg total) by mouth daily. 10/01/23   Cook, Jayce G, DO  magnesium  oxide (MAG-OX) 400 (240 Mg) MG tablet Take 1 tablet (400 mg total) by mouth daily. 10/01/23   Cook, Jayce G, DO  methocarbamol  (ROBAXIN ) 500 MG tablet TAKE 1 TABLET BY MOUTH FOUR TIMES DAILY *NEW PRESCRIPTION REQUEST* 09/26/23   Cook, Jayce G, DO  metoprolol  succinate (TOPROL -XL) 25 MG 24 hr tablet Take 1 tablet (25 mg total) by mouth daily. Patient not taking: Reported on 12/16/2023 10/01/23   Cook, Jayce G, DO  pantoprazole  (PROTONIX )  40 MG tablet Take 1 tablet (40 mg total) by mouth daily. 10/01/23   Cook, Jayce G, DO  tamsulosin  (FLOMAX ) 0.4 MG CAPS capsule Take 1 capsule (0.4 mg total) by mouth daily. 10/01/23   Cook, Jayce G, DO  umeclidinium bromide  (INCRUSE ELLIPTA ) 62.5 MCG/ACT AEPB Inhale 1 puff into the lungs daily. Patient taking differently: Inhale 1 puff into the lungs daily as needed (wheezing/sob). 10/10/23   Cobb, Comer GAILS, NP    Allergies: Patient has no known allergies.    Review of Systems  Constitutional:  Negative for chills and fever.  HENT:  Negative for ear  pain and sore throat.   Eyes:  Negative for pain and visual disturbance.  Respiratory:  Negative for cough and shortness of breath.   Cardiovascular:  Negative for chest pain and palpitations.  Gastrointestinal:  Negative for abdominal pain and vomiting.  Genitourinary:  Negative for dysuria and hematuria.  Musculoskeletal:  Negative for arthralgias and back pain.  Skin:  Negative for color change and rash.  Neurological:  Negative for seizures and syncope.  All other systems reviewed and are negative.   Updated Vital Signs BP 107/88   Pulse (!) 115   Temp 98.7 F (37.1 C)   Resp 14   Ht 5' 10 (1.778 m)   Wt 81.2 kg   SpO2 98%   BMI 25.69 kg/m   Physical Exam Vitals and nursing note reviewed.  Constitutional:      General: He is not in acute distress.    Appearance: He is well-developed.  HENT:     Head: Normocephalic and atraumatic.  Eyes:     Conjunctiva/sclera: Conjunctivae normal.  Cardiovascular:     Rate and Rhythm: Normal rate and regular rhythm.     Heart sounds: No murmur heard. Pulmonary:     Effort: Pulmonary effort is normal. No respiratory distress.     Breath sounds: Normal breath sounds.  Abdominal:     Palpations: Abdomen is soft.     Tenderness: There is no abdominal tenderness.  Musculoskeletal:        General: No swelling.     Cervical back: Neck supple.  Skin:    General: Skin is warm and dry.     Capillary Refill: Capillary refill takes less than 2 seconds.  Neurological:     Mental Status: He is alert.  Psychiatric:        Mood and Affect: Mood normal.     (all labs ordered are listed, but only abnormal results are displayed) Labs Reviewed  CBC WITH DIFFERENTIAL/PLATELET - Abnormal; Notable for the following components:      Result Value   RBC 3.71 (*)    Hemoglobin 12.0 (*)    HCT 35.6 (*)    All other components within normal limits  COMPREHENSIVE METABOLIC PANEL WITH GFR - Abnormal; Notable for the following components:    Glucose, Bld 111 (*)    Creatinine, Ser 1.28 (*)    GFR, Estimated 58 (*)    All other components within normal limits  PRO BRAIN NATRIURETIC PEPTIDE - Abnormal; Notable for the following components:   Pro Brain Natriuretic Peptide 385.0 (*)    All other components within normal limits  MAGNESIUM   TSH    EKG: EKG Interpretation Date/Time:  Tuesday December 16 2023 15:59:08 EST Ventricular Rate:  136 PR Interval:    QRS Duration:  100 QT Interval:  320 QTC Calculation: 482 R Axis:   77  Text Interpretation: Atrial fibrillation  RSR' in V1 or V2, right VCD or RVH Borderline prolonged QT interval Confirmed by Simon Rea [phone]) on 12/16/2023 4:27:03 PM  Radiology: No results found.   Procedures   Medications Ordered in the ED  amiodarone  (NEXTERONE  PREMIX) 360-4.14 MG/200ML-% (1.8 mg/mL) IV infusion (60 mg/hr Intravenous New Bag/Given 12/16/23 1841)    Followed by  amiodarone  (NEXTERONE  PREMIX) 360-4.14 MG/200ML-% (1.8 mg/mL) IV infusion (has no administration in time range)  sodium chloride  0.9 % bolus 250 mL (250 mLs Intravenous Bolus 12/16/23 1630)  amiodarone  (NEXTERONE ) 1.8 mg/mL load via infusion 150 mg (150 mg Intravenous Bolus from Bag 12/16/23 1846)    Clinical Course as of 12/16/23 1932  Tue Dec 16, 2023  1801 Dr. Neda -  one option is to shock and start on oral. Can do 400 mg BID for  week and then 400 daily for a week and then 200 daily for a week.   Second option is to start on IV medicine here. Load and then drip. Possible cardiovert later after better rate control and transition to PO. Would recommend this option first.  [TL]    Clinical Course User Index [TL] Simon Rea SAILOR, MD                                 Medical Decision Making Amount and/or Complexity of Data Reviewed Labs: ordered.  Risk Prescription drug management. Decision regarding hospitalization.     HPI:    Patient was sent to the ED from his primary care physician  office.  Patient was found to be in A-fib with RVR.  Patient states has noticed palpitations since about yesterday.  No chest pain or shortness of breath.  No nausea vomit diarrhea.  Been able tolerate p.o.  Has no complaints other than some episodes of palpitations.  Patient states been compliant with his Eliquis  5 mg twice daily.  Patient is currently not take any kind of rate control agent as below.  Denies chest pain shortness breath nausea vomit diarrhea.  Feels totally fine otherwise.    Previous medical history reviewed : Was discharged on December 13, 2023.  A-fib with RVR.  Soft and low blood pressures during hospital admission which required decreased doses of patient's beta-blocker as well as IV fluids.  Started metoprolol  25 mg twice daily per cardiology.  This was subsequently discontinued during hospitalization because of soft blood pressure.  Maintained on Eliquis  for anticoagulation.  2D echo with EF of 4045%.  Left ventricular global hypokinesis.  Reduced right ventricular systolic function.  No aortic stenosis.  Successful electrical cardioversion on 1120.  Remains normal sinus rhythm.   MDM:   Upon exam, patient maps appropriate.  96% on room air.  No tachypnea.  Pulse rate in the 130s.  A-fib with RVR appreciated on cardiac telemetry as well as EKG.  No concerns ACS event at this point time.  Asymptomatic.  No STEMI on EKG.  No occasion for troponin workup.  No concerns for PE.  Compliant with Eliquis .  No pleuritic chest pain or hemoptysis.  No risk factors.  Obtain basic laboratory workup.  Obtain electrolytes as well as magnesium  potassium to rule out any kind of large electrolyte derangements causing A-fib with RVR.  Obtain TSH as well.  Will give patient small 250 cc bolus of fluid.  EF of 4045% with stable to be gentle with this.  Reevaluation:     Upon reexamination, patient  tachycardic.   Labs unremarkable. Small aki. No large electrolyte derangement.   Consulted  cardiology: Dr. Neda -  one option is to shock and start on oral. Can do 400 mg BID for  week and then 400 daily for a week and then 200 daily for a week.   Second option is to start on IV medicine here. Load and then drip. Possible cardiovert later after better rate control and transition to PO. Would recommend this option first.  Can stay at [facility].    Discussed  with the patient.  Prefer option 2.  I do think this is the safest option he did have some episodes of hypotension during last admission.   Start patient on amiodarone  bolus and drip.  Patient will be admitted to the medical service.  Interventions: 250 cc ns bolus, amiodarone  bolus/drup   EKG Interpreted by Me: a fib    Cardiac Tele Interpreted by Me: a fib   Social Determinant of Health: Denies drugs/alcohol    Disposition and Follow Up: admit   CRITICAL CARE Performed by: Lavonia LOISE Pat   Total critical care time: 44 minutes  Critical care time was exclusive of separately billable procedures and treating other patients.  Critical care was necessary to treat or prevent imminent or life-threatening deterioration.  Critical care was time spent personally by me on the following activities: development of treatment plan with patient and/or surrogate as well as nursing, discussions with consultants, evaluation of patient's response to treatment, examination of patient, obtaining history from patient or surrogate, ordering and performing treatments and interventions, ordering and review of laboratory studies, ordering and review of radiographic studies, pulse oximetry and re-evaluation of patient's condition.       Final diagnoses:  Atrial fibrillation with rapid ventricular response [facility])    ED Discharge Orders     None          Pat Lavonia LOISE, MD 12/16/23 1932

## 2023-12-16 NOTE — ED Notes (Signed)
 Attempted to call ICU for report. No answer.

## 2023-12-16 NOTE — Transitions of Care (Post Inpatient/ED Visit) (Signed)
 12/16/2023  Name: Darryl Ward MRN: [id number] DOB: 1945-01-29  Today's TOC FU Call Status: Today's TOC FU Call Status:: Successful TOC FU Call Completed TOC FU Call Complete Date: 12/16/23  Patient's Name and Date of Birth confirmed. Name, DOB  Transition Care Management Follow-up Telephone Call Date of Discharge: 12/13/23 Discharge Facility: Darryl Ward) Type of Discharge: Inpatient Admission How have you been since you were released from the hospital?:  (eating and drinking well, no issues with bowel bladder, no A-fib detected (pt monitors this with pulse check)) Any questions or concerns?: No  Items Reviewed: Did you receive and understand the discharge instructions provided?: Yes Medications obtained,verified, and reconciled?: Yes (Medications Reviewed) Any new allergies since your discharge?: No Dietary orders reviewed?: Yes Type of Diet Ordered:: heart healthy,  low sodium Do you have support at home?: Yes People in Home [RPT]: spouse Name of Support/Comfort Primary Source: Darryl Ward  (ex-spouse that is the primary caregiver for pt) Reviewed A-Fib action plan Reviewed safety precautions Pt is checking BP daily, weighing daily and keeping a log Spoke with patient and permission given to speak with caregiver (ex spouse that cares for pt)  Reviewed normal parameters for blood pressure  Medications Reviewed Today: Medications Reviewed Today     Reviewed by Aura Mliss LABOR, RN (Registered Nurse) on 12/16/23 at 1215  Med List Status: <None>   Medication Order Taking? Sig Documenting Provider Last Dose Status Informant  acetaminophen  (TYLENOL ) 325 MG tablet [id number] Yes Take 2 tablets (650 mg total) by mouth every 6 (six) hours as needed for mild pain (pain score 1-3) or fever (or Fever >/= 101). [name] Toribio GAILS, MD  Active   albuterol  (VENTOLIN  HFA) 108 [phone] Base) MCG/ACT inhaler [id number] Yes Inhale 2 puffs into the lungs every 6 (six) hours as needed for  wheezing or shortness of breath. Malachy Comer GAILS, NP  Active Spouse/Significant Other, Pharmacy Records  apixaban  (ELIQUIS ) 5 MG TABS tablet [id number] Yes Take 1 tablet (5 mg total) by mouth 2 (two) times daily. Cook, Jayce G, DO  Active Spouse/Significant Other, Pharmacy Records  atorvastatin  (LIPITOR ) 80 MG tablet [id number] Yes Take 1 tablet (80 mg total) by mouth daily. Cook, Jayce G, DO  Active Spouse/Significant Other, Pharmacy Records  chlorthalidone  (HYGROTON ) 25 MG tablet [id number]  Take 0.5 tablets (12.5 mg total) by mouth daily.  Patient not taking: Reported on 12/16/2023   Debera Jayson MATSU, MD  Active Spouse/Significant Other, Pharmacy Records  Cholecalciferol  50 MCG [phone] UT) CAPS [id number] Yes Take 1 capsule by mouth daily. [provider]  Active Spouse/Significant Other, Pharmacy Records  escitalopram  (LEXAPRO ) 10 MG tablet [id number] Yes Take 1 tablet (10 mg total) by mouth daily. Cook, Jayce G, DO  Active Spouse/Significant Other, Pharmacy Records  feeding supplement (ENSURE PLUS HIGH PROTEIN) LIQD [id number] Yes Take 237 mLs by mouth 2 (two) times daily between meals. [name] Toribio GAILS, MD  Active   fluticasone  (FLONASE ) 50 MCG/ACT nasal spray [id number]  Place 2 sprays into both nostrils daily.  Patient not taking: Reported on 12/16/2023   Malachy Comer GAILS, NP  Active Spouse/Significant Other, Pharmacy Records  folic acid  (KP FOLIC ACID ) 1 MG tablet [id number] Yes Take 1 tablet (1 mg total) by mouth daily. Cook, Jayce G, DO  Active Spouse/Significant Other, Pharmacy Records  furosemide  (LASIX ) 20 MG tablet [id number]  Take 1 tablet (20 mg total) by mouth daily as needed.  Patient not taking: Reported on 12/16/2023   Cook, Jayce G, DO  Active Spouse/Significant Other, Pharmacy Records  magnesium  oxide (MAG-OX) 400 (240 Mg) MG tablet [id number] Yes Take 1 tablet (400 mg total) by mouth daily. Cook, Jayce G, DO  Active Spouse/Significant Other, Pharmacy Records  meclizine   (ANTIVERT ) 25 MG tablet [id number]  Take 1 tablet (25 mg total) by mouth 3 (three) times daily as needed for dizziness.  Patient not taking: Reported on 10/20/2023   Maree Bracken D, DO  Active Spouse/Significant Other, Pharmacy Records  methocarbamol  (ROBAXIN ) 500 MG tablet [id number] Yes TAKE 1 TABLET BY MOUTH FOUR TIMES DAILY *NEW PRESCRIPTION REQUESTDEWAINE Ahle, Jayce G, DO  Active Spouse/Significant Other, Pharmacy Records  metoprolol  succinate (TOPROL -XL) 25 MG 24 hr tablet [id number]  Take 1 tablet (25 mg total) by mouth daily.  Patient not taking: Reported on 12/16/2023   Cook, Jayce G, DO  Active Spouse/Significant Other, Pharmacy Records  pantoprazole  (PROTONIX ) 40 MG tablet [id number] Yes Take 1 tablet (40 mg total) by mouth daily. Cook, Jayce G, DO  Active Spouse/Significant Other, Pharmacy Records  tamsulosin  (FLOMAX ) 0.4 MG CAPS capsule [id number] Yes Take 1 capsule (0.4 mg total) by mouth daily. Cook, Jayce G, DO  Active Spouse/Significant Other, Pharmacy Records  umeclidinium bromide  (INCRUSE ELLIPTA ) 62.5 MCG/ACT AEPB [id number] Yes Inhale 1 puff into the lungs daily.  Patient taking differently: Inhale 1 puff into the lungs daily as needed (wheezing/sob).   Malachy Comer GAILS, NP  Active Spouse/Significant Other, Pharmacy Records  Med List Note Lorne Been, CPhT 12/08/23 9071): Wife handles medications.             Home Care and Equipment/Supplies: Were Home Health Services Ordered?: Yes Name of Home Health Agency:: [facility] Has Agency set up a time to come to your home?: No EMR reviewed for Home Health Orders: Orders present/patient has not received call (refer to CM for follow-up) (spoke wtih Rolin at [facility], she will check with marketing and get orders verified and call pt later today to schedule home visit for tomorrow 11/26- pt, caregiver verbalize understanding, contact # for [facility] provided) Any new equipment or medical supplies ordered?: No  Functional  Questionnaire: Do you need assistance with bathing/showering or dressing?: Yes (shower bench) Do you need assistance with meal preparation?: No Do you need assistance with eating?: No Do you have difficulty maintaining continence: No Do you need assistance with getting out of bed/getting out of a chair/moving?:  (walker, wheelchair) Do you have difficulty managing or taking your medications?: Yes (spouse provides all oversight)  Follow up appointments reviewed: PCP Follow-up appointment confirmed?: Yes Date of PCP follow-up appointment?: 12/16/23 Follow-up Provider: Jayce Cook DO   @ 230 pm Specialist Hospital Follow-up appointment confirmed?: Yes Date of Specialist follow-up appointment?: 12/31/23 Follow-Up Specialty Provider:: cardiology  Laymon Qua  @ 230 pm Do you need transportation to your follow-up appointment?: No Do you understand care options if your condition(s) worsen?: Yes-patient verbalized understanding  SDOH Interventions Today    Flowsheet Row Most Recent Value  SDOH Interventions   Food Insecurity Interventions Intervention Not Indicated  Housing Interventions Intervention Not Indicated  Transportation Interventions Intervention Not Indicated  Utilities Interventions Intervention Not Indicated    Mliss Creed [facility], BSN RN Care Manager/ Transition of Care [place]/ [facility] [phone]

## 2023-12-16 NOTE — ED Triage Notes (Signed)
 Pt sent over by pcp for afib with rvr. Pt was released from [facility] Saturday for the same.

## 2023-12-16 NOTE — Telephone Encounter (Signed)
 Copied from CRM [id number]. Topic: General - Other >> Dec 16, 2023 12:55 PM Larissa S wrote: Reason for CRM: Christy with [facility] calling to inform PCP that resumption of care orders were received on 11/23 and care for patient will reopen tomorrow on 11/26 for PT and OT Callback # [phone]

## 2023-12-16 NOTE — Assessment & Plan Note (Addendum)
 Patient back in atrial fibrillation the rapid ventricular response at the rate of 147 today.  Discussed case with cardiology.   Ultimately, I decided to send him directly to the hospital for rate control and possible repeat cardioversion.  Patient will need readmission.

## 2023-12-17 ENCOUNTER — Other Ambulatory Visit (HOSPITAL_COMMUNITY): Payer: Self-pay

## 2023-12-17 ENCOUNTER — Inpatient Hospital Stay (HOSPITAL_COMMUNITY): Admitting: Anesthesiology

## 2023-12-17 ENCOUNTER — Encounter (HOSPITAL_COMMUNITY)
Admission: EM | Disposition: A | Discharge status: Home Health | Payer: Self-pay | Source: Home / Self Care | Attending: Internal Medicine

## 2023-12-17 ENCOUNTER — Other Ambulatory Visit: Payer: Self-pay

## 2023-12-17 ENCOUNTER — Encounter (HOSPITAL_COMMUNITY): Payer: Self-pay | Admitting: Internal Medicine

## 2023-12-17 DIAGNOSIS — I4891 Unspecified atrial fibrillation: Secondary | ICD-10-CM

## 2023-12-17 DIAGNOSIS — I4819 Other persistent atrial fibrillation: Secondary | ICD-10-CM | POA: Diagnosis not present

## 2023-12-17 DIAGNOSIS — I110 Hypertensive heart disease with heart failure: Secondary | ICD-10-CM

## 2023-12-17 DIAGNOSIS — I5042 Chronic combined systolic (congestive) and diastolic (congestive) heart failure: Secondary | ICD-10-CM

## 2023-12-17 DIAGNOSIS — I2510 Atherosclerotic heart disease of native coronary artery without angina pectoris: Secondary | ICD-10-CM | POA: Diagnosis not present

## 2023-12-17 HISTORY — PX: CARDIOVERSION: SHX1299

## 2023-12-17 LAB — CBC
HCT: 32.8 % — ABNORMAL LOW (ref 39.0–52.0)
Hemoglobin: 10.9 g/dL — ABNORMAL LOW (ref 13.0–17.0)
MCH: 31.9 pg (ref 26.0–34.0)
MCHC: 33.2 g/dL (ref 30.0–36.0)
MCV: 95.9 fL (ref 80.0–100.0)
Platelets: 148 K/uL — ABNORMAL LOW (ref 150–400)
RBC: 3.42 MIL/uL — ABNORMAL LOW (ref 4.22–5.81)
RDW: 14.4 % (ref 11.5–15.5)
WBC: 7.2 K/uL (ref 4.0–10.5)
nRBC: 0 % (ref 0.0–0.2)

## 2023-12-17 LAB — BASIC METABOLIC PANEL WITH GFR
Anion gap: 8 (ref 5–15)
BUN: 14 mg/dL (ref 8–23)
CO2: 24 mmol/L (ref 22–32)
Calcium: 8.5 mg/dL — ABNORMAL LOW (ref 8.9–10.3)
Chloride: 106 mmol/L (ref 98–111)
Creatinine, Ser: 1.18 mg/dL (ref 0.61–1.24)
GFR, Estimated: >60 mL/min (ref >60)
Glucose, Bld: 88 mg/dL (ref 70–99)
Potassium: 3.9 mmol/L (ref 3.5–5.1)
Sodium: 139 mmol/L (ref 135–145)

## 2023-12-17 SURGERY — CARDIOVERSION
Anesthesia: Monitor Anesthesia Care

## 2023-12-17 MED ORDER — AMIODARONE HCL 200 MG PO TABS
200.0000 mg | ORAL_TABLET | ORAL | 1 refills | Status: DC
  Filled 2023-12-17 (×2): qty 40, 30d supply, fill #0

## 2023-12-17 MED ORDER — CHLORHEXIDINE GLUCONATE 0.12 % MT SOLN: 15.0000 mL | Freq: Once | OROMUCOSAL | Status: DC

## 2023-12-17 MED ORDER — PROPOFOL 10 MG/ML IV BOLUS
INTRAVENOUS | Status: DC | PRN
  Administered 2023-12-17: 80 mg via INTRAVENOUS

## 2023-12-17 MED ORDER — PANTOPRAZOLE SODIUM 40 MG PO TBEC
40.0000 mg | DELAYED_RELEASE_TABLET | Freq: Every day | ORAL | 3 refills | Status: DC
  Filled 2023-12-17 – 2024-01-18 (×3): qty 90, 90d supply, fill #0

## 2023-12-17 MED ORDER — PROPOFOL 10 MG/ML IV BOLUS
INTRAVENOUS | Status: AC
  Filled 2023-12-17: qty 20

## 2023-12-17 MED ORDER — LIDOCAINE HCL (CARDIAC) PF 100 MG/5ML IV SOSY
PREFILLED_SYRINGE | INTRAVENOUS | Status: DC | PRN
  Administered 2023-12-17: 40 mg via INTRATRACHEAL

## 2023-12-17 MED ORDER — AMIODARONE HCL 200 MG PO TABS
200.0000 mg | ORAL_TABLET | Freq: Two times a day (BID) | ORAL | Status: DC
  Administered 2023-12-17: 200 mg via ORAL
  Filled 2023-12-17: qty 1

## 2023-12-17 MED ORDER — INCRUSE ELLIPTA 62.5 MCG/ACT IN AEPB
1.0000 | INHALATION_SPRAY | Freq: Every day | RESPIRATORY_TRACT | 6 refills | Status: AC
  Filled 2023-12-17: qty 30, 30d supply, fill #0

## 2023-12-17 MED ORDER — APIXABAN 5 MG PO TABS
5.0000 mg | ORAL_TABLET | Freq: Two times a day (BID) | ORAL | 3 refills | Status: DC
  Filled 2023-12-17: qty 180, 90d supply, fill #0

## 2023-12-17 MED ORDER — HEPARIN SOD (PORK) LOCK FLUSH 100 UNIT/ML IV SOLN
500.0000 [IU] | Freq: Once | INTRAVENOUS | Status: AC
  Administered 2023-12-17: 500 [IU] via INTRAVENOUS
  Filled 2023-12-17: qty 5

## 2023-12-17 MED ORDER — ALBUTEROL SULFATE HFA 108 (90 BASE) MCG/ACT IN AERS
2.0000 | INHALATION_SPRAY | Freq: Four times a day (QID) | RESPIRATORY_TRACT | 6 refills | Status: DC | PRN
  Filled 2023-12-17: qty 6.7, 25d supply, fill #0

## 2023-12-17 MED ORDER — ORAL CARE MOUTH RINSE: 15.0000 mL | Freq: Once | OROMUCOSAL | Status: DC

## 2023-12-17 MED ORDER — AMIODARONE HCL 200 MG PO TABS: 200.0000 mg | ORAL_TABLET | Freq: Every day | ORAL | Status: DC

## 2023-12-17 SURGICAL SUPPLY — 2 items
GLOVE BIOGEL PI IND STRL 7.0 (GLOVE) ×2 IMPLANT
POSITIONER HEAD 8X9X4 ADT (SOFTGOODS) ×1 IMPLANT

## 2023-12-17 NOTE — Progress Notes (Signed)
 Direct Current Cardioversion performed successfully with 150J.  Patient converted to a normal sinus brady without complications.

## 2023-12-17 NOTE — Transfer of Care (Signed)
 Immediate Anesthesia Transfer of Care Note  Patient: Darryl Ward  Procedure(s) Performed: CARDIOVERSION  Patient Location: PACU  Anesthesia Type:General  Level of Consciousness: awake  Airway & Oxygen Therapy: Patient Spontanous Breathing  Post-op Assessment: Report given to RN  Post vital signs: Reviewed and stable  Last Vitals:  Vitals Value Taken Time  BP    Temp    Pulse    Resp    SpO2      Last Pain:  Vitals:   12/17/23 1056  TempSrc: Oral  PainSc: 0-No pain         Complications: No notable events documented.

## 2023-12-17 NOTE — H&P (View-Only) (Signed)
 Cardiology Consultation   Patient ID: Darryl Ward MRN: [id number]; DOB: Sep 25, 1945  Admit date: 12/16/2023 Date of Consult: 12/17/2023  PCP:  Bluford Jacqulyn MATSU, DO   [facility] Providers Cardiologist:  [name], MD        Patient Profile: Darryl Ward is a 78 y.o. 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 Pearlean.  History of Present Illness: [name] was recently admitted to [facility] from 11/16 - 12/13/2023 for acute gastritis with associated nausea, vomiting and diarrhea. Cardiology was consulted as he was found to be in atrial fibrillation with RVR and initially on IV Cardizem . He was switched to Lopressor  given issues with hypotension and ultimately underwent successful DCCV on 12/11/2023 with conversion back to normal sinus rhythm with a synchronized biphasic 200J shock. He did have hypotension following the procedure and beta-blocker therapy was discontinued with plans to consider resumption as an outpatient.  Echocardiogram during admission also showed his EF was mildly reduced at 40 to 45% and was recommended to repeat a TTE in 2 to 3 months for reassessment. Additional GDMT was deferred given his recent AKI and soft BP.  Was recommended to have follow-up with Vascular as an outpatient given his 4.4 cm AAA. At the time of discharge, Chlorthalidone , Lasix  and Toprol -XL were held with him being continued on Eliquis  5 mg twice daily and Atorvastatin  80 mg daily.  He was evaluated by his PCP yesterday for hospital follow-up and reported feeling weak and fatigued.  He was found to be  back in atrial fibrillation with RVR and was sent to the hospital for further evaluation.  Initial labs showed WBC 8.2, Hgb 12.0, platelets 168, Na+ 137, K+ 4.0 and creatinine 1.28 (baseline 0.9 - 1.0). Mg 2.0. TSH 1.330. proBNP mildly elevated at 385. EKG showed atrial fibrillation with RVR, heart rate 136. He received a 250 mL fluid bolus and was started on IV Amiodarone  while being continued on Eliquis  for anticoagulation.  In talking with the patient today, he reports he initially felt well upon going home but developed more fatigue and weakness yesterday. He denies any specific chest pain or palpitations. Was unaware that he was back in atrial fibrillation until he had his office visit with his PCP. He denies any recurrent nausea, vomiting or diarrhea since his recent admission. Says that his appetite has been improving. He reports good compliance with Eliquis  and has been taking this twice daily and has not missed any doses per his report. He feels back to baseline this morning.  Past Medical History:  Diagnosis Date   AAA (abdominal aortic aneurysm)    Anemia    Arthritis    Blood transfusion without reported diagnosis 10/2021   Broken hip (HCC)    Cancer (HCC)    Carotid artery disease    Nonobstructive   Cataract    CHF (congestive heart failure) (HCC)    COPD (chronic obstructive pulmonary disease) (HCC)    Coronary atherosclerosis of native coronary artery    PTCA small diagonal 2007 otherwise nonobstructive CAD   Depression    Dysrhythmia  Essential hypertension, benign    GERD (gastroesophageal reflux disease)    Hyperlipidemia    NSTEMI (non-ST elevated myocardial infarction) (HCC) 2007   Stroke [facility]) 2004   Stroke [facility]) 2025   TIA (transient ischemic attack) 2006    Past Surgical History:  Procedure Laterality Date   AORTA - BILATERAL FEMORAL ARTERY BYPASS GRAFT  01/08/2012   Procedure: AORTA BIFEMORAL BYPASS GRAFT;  Surgeon: Carlin FORBES Haddock, MD;  Location: [facility] OR;   Service: Vascular;  Laterality: Bilateral;  using 18x33mm x 40cm Hemashield Gold Vascular Graft with Endarterectomy, Thombectomy and  Reimplantation of Inferior Mesenteric Artery   BACK SURGERY  2021   BRONCHIAL BIOPSY  06/11/2021   Procedure: BRONCHIAL BIOPSIES;  Surgeon: Shelah Lamar RAMAN, MD;  Location: [facility] ENDOSCOPY;  Service: Pulmonary;;   BRONCHIAL BRUSHINGS  06/11/2021   Procedure: BRONCHIAL BRUSHINGS;  Surgeon: Shelah Lamar RAMAN, MD;  Location: [facility] ENDOSCOPY;  Service: Pulmonary;;   BRONCHIAL NEEDLE ASPIRATION BIOPSY  06/11/2021   Procedure: BRONCHIAL NEEDLE ASPIRATION BIOPSIES;  Surgeon: Shelah Lamar RAMAN, MD;  Location: [facility] ENDOSCOPY;  Service: Pulmonary;;   CARDIOVERSION N/A 09/20/2021   Procedure: CARDIOVERSION;  Surgeon: Debera Jayson MATSU, MD;  Location: [facility] ORS;  Service: Cardiovascular;  Laterality: N/A;   CARDIOVERSION N/A 12/11/2023   Procedure: CARDIOVERSION;  Surgeon: Francyne Headland, MD;  Location: [facility] INVASIVE CV LAB;  Service: Cardiovascular;  Laterality: N/A;   COLONOSCOPY N/A 04/14/2019   Procedure: COLONOSCOPY;  Surgeon: Shaaron Lamar HERO, MD;  Location: [facility] ENDO SUITE;  Service: Endoscopy;  Laterality: N/A;  9:30   COLONOSCOPY WITH PROPOFOL  N/A 07/25/2021   Procedure: COLONOSCOPY WITH PROPOFOL ;  Surgeon: Cindie Carlin POUR, DO;  Location: [facility] ENDO SUITE;  Service: Endoscopy;  Laterality: N/A;  1:00pm   EYE SURGERY     FIDUCIAL MARKER PLACEMENT  06/11/2021   Procedure: FIDUCIAL MARKER PLACEMENT;  Surgeon: Shelah Lamar RAMAN, MD;  Location: [facility] ENDOSCOPY;  Service: Pulmonary;;   FRACTURE SURGERY     HIP ARTHROPLASTY Right 04/28/2022   Procedure: ARTHROPLASTY BIPOLAR HIP (HEMIARTHROPLASTY);  Surgeon: Beverley Evalene BIRCH, MD;  Location: [facility] OR;  Service: Orthopedics;  Laterality: Right;   INTERCOSTAL NERVE BLOCK Right 11/12/2021   Procedure: INTERCOSTAL NERVE BLOCK;  Surgeon: Kerrin Elspeth BROCKS, MD;  Location: [facility] OR;  Service: Thoracic;  Laterality: Right;   IR IMAGING GUIDED PORT INSERTION   07/31/2021   JOINT REPLACEMENT     Left cataract surgery     LYMPH NODE DISSECTION Right 11/12/2021   Procedure: LYMPH NODE DISSECTION;  Surgeon: Kerrin Elspeth BROCKS, MD;  Location: [facility] OR;  Service: Thoracic;  Laterality: Right;   POLYPECTOMY  04/14/2019   Procedure: POLYPECTOMY;  Surgeon: Shaaron Lamar HERO, MD;  Location: [facility] ENDO SUITE;  Service: Endoscopy;;   POLYPECTOMY  07/25/2021   Procedure: POLYPECTOMY;  Surgeon: Cindie Carlin POUR, DO;  Location: [facility] ENDO SUITE;  Service: Endoscopy;;   SPINE SURGERY     TEE WITHOUT CARDIOVERSION N/A 09/20/2021   Procedure: TRANSESOPHAGEAL ECHOCARDIOGRAM (TEE);  Surgeon: Debera Jayson MATSU, MD;  Location: [facility] ORS;  Service: Cardiovascular;  Laterality: N/A;   TOTAL HIP ARTHROPLASTY Right 05/15/2023   Procedure: RIGHT HIP HEMIARTHROPLASTY REVISION WITH OPEN REDUCTION INTERNAL FIXATION OF PERIPROSTHETIC FEMUR FRACTURE;  Surgeon: Beverley Evalene BIRCH, MD;  Location: [facility] ORS;  Service: Orthopedics;  Laterality: Right;   TRANSFORAMINAL LUMBAR INTERBODY FUSION (TLIF) WITH PEDICLE SCREW FIXATION 1 LEVEL N/A 04/27/2020   Procedure: Transforaminal Lumbar Interbody Fusion Lumbar Five-Sacral One;  Surgeon: Debby Dorn MATSU, MD;  Location: [facility]  OR;  Service: Neurosurgery;  Laterality: N/A;   VIDEO BRONCHOSCOPY WITH INSERTION OF INTERBRONCHIAL VALVE (IBV) N/A 11/22/2021   Procedure: VIDEO BRONCHOSCOPY WITH INSERTION OF INTERBRONCHIAL VALVE (IBV);  Surgeon: Kerrin Elspeth BROCKS, MD;  Location: [facility] OR;  Service: Thoracic;  Laterality: N/A;   VIDEO BRONCHOSCOPY WITH INSERTION OF INTERBRONCHIAL VALVE (IBV) N/A 01/24/2022   Procedure: VIDEO BRONCHOSCOPY WITH REMOVAL OF INTERBRONCHIAL VALVE (IBV);  Surgeon: Kerrin Elspeth BROCKS, MD;  Location: [facility] OR;  Service: Thoracic;  Laterality: N/A;   VIDEO BRONCHOSCOPY WITH RADIAL ENDOBRONCHIAL ULTRASOUND  06/11/2021   Procedure: VIDEO BRONCHOSCOPY WITH RADIAL ENDOBRONCHIAL ULTRASOUND;  Surgeon: Shelah Lamar RAMAN, MD;  Location: [facility]  ENDOSCOPY;  Service: Pulmonary;;     Home Medications:  Prior to Admission medications   Medication Sig Start Date End Date Taking? Authorizing Provider  acetaminophen  (TYLENOL ) 325 MG tablet Take 2 tablets (650 mg total) by mouth every 6 (six) hours as needed for mild pain (pain score 1-3) or fever (or Fever >/= 101). 12/13/23   [name] Toribio GAILS, MD  albuterol  (VENTOLIN  HFA) 108 [phone] Base) MCG/ACT inhaler Inhale 2 puffs into the lungs every 6 (six) hours as needed for wheezing or shortness of breath. 10/10/23   Cobb, Comer GAILS, NP  apixaban  (ELIQUIS ) 5 MG TABS tablet Take 1 tablet (5 mg total) by mouth 2 (two) times daily. 10/01/23   Cook, Jayce G, DO  atorvastatin  (LIPITOR ) 80 MG tablet Take 1 tablet (80 mg total) by mouth daily. 10/01/23   Cook, Jayce G, DO  Cholecalciferol  50 MCG (2000 UT) CAPS Take 1 capsule by mouth daily.    [provider]  escitalopram  (LEXAPRO ) 10 MG tablet Take 1 tablet (10 mg total) by mouth daily. 10/01/23   Cook, Jayce G, DO  feeding supplement (ENSURE PLUS HIGH PROTEIN) LIQD Take 237 mLs by mouth 2 (two) times daily between meals. 12/13/23   [name] Toribio GAILS, MD  fluticasone  (FLONASE ) 50 MCG/ACT nasal spray Place 2 sprays into both nostrils daily. Patient not taking: Reported on 12/16/2023 06/23/23   Malachy Comer GAILS, NP  folic acid  (KP FOLIC ACID ) 1 MG tablet Take 1 tablet (1 mg total) by mouth daily. 10/01/23   Cook, Jayce G, DO  magnesium  oxide (MAG-OX) 400 (240 Mg) MG tablet Take 1 tablet (400 mg total) by mouth daily. 10/01/23   Cook, Jayce G, DO  methocarbamol  (ROBAXIN ) 500 MG tablet TAKE 1 TABLET BY MOUTH FOUR TIMES DAILY *NEW PRESCRIPTION REQUEST* 09/26/23   Cook, Jayce G, DO  metoprolol  succinate (TOPROL -XL) 25 MG 24 hr tablet Take 1 tablet (25 mg total) by mouth daily. Patient not taking: Reported on 12/16/2023 10/01/23   Cook, Jayce G, DO  pantoprazole  (PROTONIX ) 40 MG tablet Take 1 tablet (40 mg total) by mouth daily. 10/01/23   Cook, Jayce G, DO   tamsulosin  (FLOMAX ) 0.4 MG CAPS capsule Take 1 capsule (0.4 mg total) by mouth daily. 10/01/23   Cook, Jayce G, DO  umeclidinium bromide  (INCRUSE ELLIPTA ) 62.5 MCG/ACT AEPB Inhale 1 puff into the lungs daily. Patient taking differently: Inhale 1 puff into the lungs daily as needed (wheezing/sob). 10/10/23   Cobb, Comer GAILS, NP    Scheduled Meds:  apixaban   5 mg Oral BID   Chlorhexidine  Gluconate Cloth  6 each Topical Daily   Continuous Infusions:  amiodarone  30 mg/hr (12/17/23 0643)   PRN Meds: acetaminophen  **OR** acetaminophen , ondansetron  **OR** ondansetron  (ZOFRAN ) IV, polyethylene glycol  Allergies:   No Known Allergies  Social History:   Social  History   Socioeconomic History   Marital status: Divorced    Spouse name: Not on file   Number of children: 1   Years of education: 29   Highest education level: 11th grade  Occupational History    Employer: [employer]  Tobacco Use   Smoking status: Former    Current packs/day: 0.00    Average packs/day: 1 pack/day for 40.0 years (40.0 ttl pk-yrs)    Types: Cigarettes    Start date: 07/1981    Quit date: 07/2021    Years since quitting: 2.4   Smokeless tobacco: Never   Tobacco comments:    1 pack of cigarettes smoked daily. 07/17/21 ARJ, RN   Vaping Use   Vaping status: Never Used  Substance and Sexual Activity   Alcohol use: No    Comment: Prior history of regular alcohol use   Drug use: No   Sexual activity: Not Currently  Other Topics Concern   Not on file  Social History Narrative   Not on file    Family History:    Family History  Problem Relation Age of Onset   Hearing loss Mother    Miscarriages / Stillbirths Mother    Vision loss Mother    Hyperlipidemia Sister    Hypertension Sister    Stroke Sister    Heart attack Brother 26   Cancer Brother    Hearing loss Brother    Heart disease Brother    Hypertension Brother    Hearing loss Brother    Heart disease Brother    Hypertension  Brother    Cancer - Colon Neg Hx      ROS:  Please see the history of present illness.   All other ROS reviewed and negative.     Physical Exam/Data: Vitals:   12/17/23 0500 12/17/23 0600 12/17/23 0700 12/17/23 0800  BP: 120/82 (!) 124/90 120/77 108/72  Pulse: (!) 115 (!) 121 94 (!) 58  Resp: 15 13  15   Temp:    98 F (36.7 C)  TempSrc:    Other (Comment)  SpO2: 97% 100% 97% 91%  Weight:      Height:        Intake/Output Summary (Last 24 hours) at 12/17/2023 0901 Last data filed at 12/17/2023 0643 Gross per 24 hour  Intake 321.37 ml  Output 650 ml  Net -328.63 ml      12/16/2023    9:25 PM 12/16/2023    3:53 PM 12/16/2023    2:13 PM  Last 3 Weights  Weight (lbs) 175 lb 11.3 oz 179 lb 0.2 oz 179 lb  Weight (kg) 79.7 kg 81.2 kg 81.194 kg     Body mass index is 25.21 kg/m.  General:  Well nourished, well developed male appearing in no acute distress HEENT: normal Neck: no JVD Vascular: No carotid bruits; Distal pulses 2+ bilaterally Cardiac:  normal S1, S2; irregular irregular Lungs:  clear to auscultation bilaterally, no wheezing, rhonchi or rales  Abd: soft, nontender, no hepatomegaly  Ext: no pitting edema Musculoskeletal:  No deformities, BUE and BLE strength normal and equal Skin: warm and dry  Neuro:  CNs 2-12 intact, no focal abnormalities noted Psych:  Normal affect   Telemetry:  Telemetry was personally reviewed and demonstrates: Atrial fibrillation with RVR, heart rate in the 110's to 120's with occasional PVC's and couplets.  Relevant CV Studies:  Echocardiogram: 11/2023   1. Left ventricular ejection fraction, by estimation, is 40 to 45%. The  left  ventricle has mildly decreased function. The left ventricle  demonstrates global hypokinesis. Left ventricular diastolic parameters are  indeterminate.   2. Right ventricular systolic function is moderately reduced. The right  ventricular size is normal.   3. Left atrial size was moderately  dilated.   4. Right atrial size was mildly dilated.   5. The mitral valve is normal in structure. Trivial mitral valve  regurgitation. No evidence of mitral stenosis.   6. The aortic valve is tricuspid. There is mild calcification of the  aortic valve. Aortic valve regurgitation is not visualized. No aortic  stenosis is present.   Laboratory Data: High Sensitivity Troponin:  No results for input(s): TROPONINIHS in the last 720 hours.   Chemistry Recent Labs  Lab 12/12/23 0417 12/13/23 0524 12/16/23 1614 12/17/23 0507  NA 139 137 137 139  K 3.7 3.8 4.0 3.9  CL 108 106 104 106  CO2 23 24 24 24   GLUCOSE 90 101* 111* 88  BUN 18 16 16 14   CREATININE 1.04 0.98 1.28* 1.18  CALCIUM  8.7* 8.7* 9.1 8.5*  MG 2.1 1.9 2.0  --   GFRNONAA >60 >60 58* >60  ANIONGAP 7 8 9 8     Recent Labs  Lab 12/11/23 0458 12/16/23 1614  PROT  --  6.5  ALBUMIN  3.0* 3.6  AST  --  24  ALT  --  12  ALKPHOS  --  67  BILITOT  --  0.6   Lipids No results for input(s): CHOL, TRIG, HDL, LABVLDL, LDLCALC, CHOLHDL in the last 168 hours.  Hematology Recent Labs  Lab 12/13/23 0524 12/16/23 1614 12/17/23 0507  WBC 8.5 8.2 7.2  RBC 3.26* 3.71* 3.42*  HGB 10.2* 12.0* 10.9*  HCT 31.0* 35.6* 32.8*  MCV 95.1 96.0 95.9  MCH 31.3 32.3 31.9  MCHC 32.9 33.7 33.2  RDW 14.2 14.5 14.4  PLT 137* 168 148*   Thyroid   Recent Labs  Lab 12/16/23 1614  TSH 1.330    BNP Recent Labs  Lab 12/16/23 1614  PROBNP 385.0*    DDimer No results for input(s): DDIMER in the last 168 hours.  Radiology/Studies:  No results found.   Assessment and Plan:  1. Atrial Fibrillation with RVR - He has a history of paroxysmal atrial fibrillation but had persistent atrial fibrillation during his recent admission earlier this month for gastroenteritis and underwent DCCV with successful conversion back to normal sinus rhythm. Found to be back in atrial fibrillation with RVR at the time of his office visit with his  PCP yesterday. - Electrolytes and TSH WNL. He has been on IV Amiodarone  as BP did not allow for the use of Cardizem  or beta-blocker therapy. Previously discussed with Dr. Debera and will plan for DCCV today given compliance with Eliquis . Scheduled for 1130 and orders entered. Plan to switch to PO Amiodarone  following the procedure with a loading dose and this can be reduced as an outpatient. This is not ideal long-term given his history of lung cancer but options for medical therapy are limited at this time given his CAD and CHF. Can consider EP referral as an outpatient to assess alternatives. - Continue Eliquis  5 mg twice daily for anticoagulation which is the appropriate dose given his current age, weight and renal function.  2. HFmrEF - Recent echocardiogram earlier this month showed his EF was mildly reduced at 40 to 45% and unclear if tachycardia-mediated or an ischemic etiology. RV function was also moderately reduced.   - Would plan for  a follow-up echocardiogram in several months for reassessment of his EF. - BP limits the addition of GDMT at this time. Could consider an SGLT2i going forward but not started during prior admission given N/V/D and AKI.   3. CAD/HLD - He previously underwent prior PTCA of D1 in 2007 and did have a low-risk NST in 2013. He denies any recent anginal symptoms. He is not on ASA given the need for anticoagulation. On Atorvastatin  80 mg daily as an outpatient and this can be resumed at the time of discharge.  4.  AAA/PVD - He is s/p bilateral aortofemoral bypass graft in 2013 and CT during recent admission showed dilatation of the infrarenal abdominal aorta measuring 4.4 cm with follow-up imaging recommended in 12 months. - He is already followed by Dr. Sheree with Vascular Surgery.    Informed Consent   Shared Decision Making/Informed Consent{ The risks (stroke, cardiac arrhythmias rarely resulting in the need for a temporary or permanent pacemaker, skin  irritation or burns and complications associated with conscious sedation including aspiration, arrhythmia, respiratory failure and death), benefits (restoration of normal sinus rhythm) and alternatives of a direct current cardioversion were explained in detail to Mr. Gunn and he agrees to proceed.   Risk Assessment/Risk Scores: CHA2DS2-VASc Score = 7  This indicates a 11.2% annual risk of stroke. The patient's score is based upon: CHF History: 1 HTN History: 1 Diabetes History: 0 Stroke History: 2 Vascular Disease History: 1 Age Score: 2 Gender Score: 0    For questions or updates, please contact [facility] Please consult [email or web address] for contact info under   Signed, Laymon CHRISTELLA Qua, PA-C  12/17/2023 9:01 AM

## 2023-12-17 NOTE — Anesthesia Preprocedure Evaluation (Signed)
 Anesthesia Evaluation  Patient identified by MRN, date of birth, ID band Patient awake    Reviewed: Allergy & Precautions, H&P , NPO status , Patient's Chart, lab work & pertinent test results, reviewed documented beta blocker date and time   Airway Mallampati: II  TM Distance: >3 FB Neck ROM: full    Dental no notable dental hx.    Pulmonary COPD, former smoker   Pulmonary exam normal breath sounds clear to auscultation       Cardiovascular Exercise Tolerance: Good hypertension, + CAD, + Past MI, + Peripheral Vascular Disease and +CHF  + dysrhythmias  Rhythm:irregular Rate:Tachycardia     Neuro/Psych  PSYCHIATRIC DISORDERS  Depression    TIACVA    GI/Hepatic Neg liver ROS,GERD  ,,  Endo/Other  negative endocrine ROS    Renal/GU negative Renal ROS  negative genitourinary   Musculoskeletal   Abdominal   Peds  Hematology  (+) Blood dyscrasia, anemia   Anesthesia Other Findings   Reproductive/Obstetrics negative OB ROS                              Anesthesia Physical Anesthesia Plan  ASA: 4 and emergent  Anesthesia Plan: MAC   Post-op Pain Management:    Induction:   PONV Risk Score and Plan: Propofol  infusion  Airway Management Planned:   Additional Equipment:   Intra-op Plan:   Post-operative Plan:   Informed Consent: I have reviewed the patients History and Physical, chart, labs and discussed the procedure including the risks, benefits and alternatives for the proposed anesthesia with the patient or authorized representative who has indicated his/her understanding and acceptance.     Dental Advisory Given  Plan Discussed with: CRNA  Anesthesia Plan Comments:         Anesthesia Quick Evaluation

## 2023-12-17 NOTE — Interval H&P Note (Signed)
 History and Physical Interval Note:  12/17/2023 11:11 AM  Darryl Ward  has presented today for surgery, with the diagnosis of a-fib.  The various methods of treatment have been discussed with the patient and family. After consideration of risks, benefits and other options for treatment, the patient has consented to  Procedure(s): CARDIOVERSION (N/A) as a surgical intervention.  The patient's history has been reviewed, patient examined, no change in status, stable for surgery.  I have reviewed the patient's chart and labs.  Questions were answered to the patient's satisfaction.     [name]

## 2023-12-17 NOTE — Discharge Summary (Signed)
 Darryl Ward, is a 78 y.o. male  DOB 1945-07-12  MRN [id number].  Admission date:  12/16/2023  Admitting Physician  Tully FORBES Carwin, MD  Discharge Date:  12/17/2023   Primary MD  Cook, Jayce G, DO  Recommendations for primary care physician for things to follow:  1)Take amiodarone  200 mg --1 tablet twice daily for 5 days and then reduce to 1 tablet once daily after 5 days  2) stop metoprolol /Toprol -XL  3)Watch for bleeding while on Blood Thinners--watch for blood in your stool which can make your stool black, maroon, mahogany or red---, blood in your urine which can make your urine pink or red, nosebleeds , also watch for possible bruising -You are taking Apixaban /Eliquis --- which is a blood thinner--- be careful to avoid injury or falls  4)Avoid ibuprofen/Advil/Aleve/Motrin/Goody Powders/Naproxen/BC powders/Meloxicam/Diclofenac/Indomethacin and other Nonsteroidal anti-inflammatory medications as these will make you more likely to bleed and can cause stomach ulcers, can also cause Kidney problems.   Admission Diagnosis  Atrial fibrillation with rapid ventricular response (HCC) [I48.91] Atrial fibrillation with RVR (HCC) [I48.91]  Discharge Diagnosis  Atrial fibrillation with rapid ventricular response (HCC) [I48.91] Atrial fibrillation with RVR (HCC) [I48.91]   Principal Problem:   Atrial fibrillation with rapid ventricular response (HCC) Active Problems:   COPD (chronic obstructive pulmonary disease) (HCC)   Primary adenocarcinoma of upper lobe of right lung (HCC)   Essential hypertension   History of stroke   Chronic combined systolic and diastolic CHF (congestive heart failure) (HCC)   Dilation of aorta      Past Medical History:  Diagnosis Date   AAA (abdominal aortic aneurysm)    Anemia    Arthritis    Blood transfusion without reported diagnosis 10/2021   Broken hip (HCC)    Cancer  (HCC)    Carotid artery disease    Nonobstructive   Cataract    CHF (congestive heart failure) (HCC)    COPD (chronic obstructive pulmonary disease) (HCC)    Coronary atherosclerosis of native coronary artery    PTCA small diagonal 2007 otherwise nonobstructive CAD   Depression    Dysrhythmia    Essential hypertension, benign    GERD (gastroesophageal reflux disease)    Hyperlipidemia    NSTEMI (non-ST elevated myocardial infarction) (HCC) 2007   Stroke [facility]) 2004   Stroke [facility]) 2025   TIA (transient ischemic attack) 2006    Past Surgical History:  Procedure Laterality Date   AORTA - BILATERAL FEMORAL ARTERY BYPASS GRAFT  01/08/2012   Procedure: AORTA BIFEMORAL BYPASS GRAFT;  Surgeon: Carlin FORBES Haddock, MD;  Location: [facility] OR;  Service: Vascular;  Laterality: Bilateral;  using 18x68mm x 40cm Hemashield Gold Vascular Graft with Endarterectomy, Thombectomy and  Reimplantation of Inferior Mesenteric Artery   BACK SURGERY  2021   BRONCHIAL BIOPSY  06/11/2021   Procedure: BRONCHIAL BIOPSIES;  Surgeon: Shelah Lamar RAMAN, MD;  Location: [facility] ENDOSCOPY;  Service: Pulmonary;;   BRONCHIAL BRUSHINGS  06/11/2021   Procedure: BRONCHIAL BRUSHINGS;  Surgeon: Shelah Lamar RAMAN, MD;  Location: [facility]  ENDOSCOPY;  Service: Pulmonary;;   BRONCHIAL NEEDLE ASPIRATION BIOPSY  06/11/2021   Procedure: BRONCHIAL NEEDLE ASPIRATION BIOPSIES;  Surgeon: Shelah Lamar RAMAN, MD;  Location: [facility] ENDOSCOPY;  Service: Pulmonary;;   CARDIOVERSION N/A 09/20/2021   Procedure: CARDIOVERSION;  Surgeon: Debera Jayson MATSU, MD;  Location: [facility] ORS;  Service: Cardiovascular;  Laterality: N/A;   CARDIOVERSION N/A 12/11/2023   Procedure: CARDIOVERSION;  Surgeon: Francyne Headland, MD;  Location: [facility] INVASIVE CV LAB;  Service: Cardiovascular;  Laterality: N/A;   CARDIOVERSION N/A 12/17/2023   Procedure: CARDIOVERSION;  Surgeon: Debera Jayson MATSU, MD;  Location: [facility] ORS;  Service: Cardiovascular;  Laterality: N/A;   COLONOSCOPY N/A 04/14/2019    Procedure: COLONOSCOPY;  Surgeon: Shaaron Lamar HERO, MD;  Location: [facility] ENDO SUITE;  Service: Endoscopy;  Laterality: N/A;  9:30   COLONOSCOPY WITH PROPOFOL  N/A 07/25/2021   Procedure: COLONOSCOPY WITH PROPOFOL ;  Surgeon: Cindie Carlin POUR, DO;  Location: [facility] ENDO SUITE;  Service: Endoscopy;  Laterality: N/A;  1:00pm   EYE SURGERY     FIDUCIAL MARKER PLACEMENT  06/11/2021   Procedure: FIDUCIAL MARKER PLACEMENT;  Surgeon: Shelah Lamar RAMAN, MD;  Location: [facility] ENDOSCOPY;  Service: Pulmonary;;   FRACTURE SURGERY     HIP ARTHROPLASTY Right 04/28/2022   Procedure: ARTHROPLASTY BIPOLAR HIP (HEMIARTHROPLASTY);  Surgeon: Beverley Evalene BIRCH, MD;  Location: [facility] OR;  Service: Orthopedics;  Laterality: Right;   INTERCOSTAL NERVE BLOCK Right 11/12/2021   Procedure: INTERCOSTAL NERVE BLOCK;  Surgeon: Kerrin Elspeth BROCKS, MD;  Location: [facility] OR;  Service: Thoracic;  Laterality: Right;   IR IMAGING GUIDED PORT INSERTION  07/31/2021   JOINT REPLACEMENT     Left cataract surgery     LYMPH NODE DISSECTION Right 11/12/2021   Procedure: LYMPH NODE DISSECTION;  Surgeon: Kerrin Elspeth BROCKS, MD;  Location: [facility] OR;  Service: Thoracic;  Laterality: Right;   POLYPECTOMY  04/14/2019   Procedure: POLYPECTOMY;  Surgeon: Shaaron Lamar HERO, MD;  [facility] ENDO SUITE;  Service: Endoscopy;;   POLYPECTOMY  07/25/2021   Procedure: POLYPECTOMY;  Surgeon: Cindie Carlin POUR, DO;  Location: [facility] ENDO SUITE;  Service: Endoscopy;;   SPINE SURGERY     TEE WITHOUT CARDIOVERSION N/A 09/20/2021   Procedure: TRANSESOPHAGEAL ECHOCARDIOGRAM (TEE);  Surgeon: Debera Jayson MATSU, MD;  Location: [facility] ORS;  Service: Cardiovascular;  Laterality: N/A;   TOTAL HIP ARTHROPLASTY Right 05/15/2023   Procedure: RIGHT HIP HEMIARTHROPLASTY REVISION WITH OPEN REDUCTION INTERNAL FIXATION OF PERIPROSTHETIC FEMUR FRACTURE;  Surgeon: Beverley Evalene BIRCH, MD;  Location: [facility] ORS;  Service: Orthopedics;  Laterality: Right;   TRANSFORAMINAL LUMBAR INTERBODY FUSION (TLIF) WITH  PEDICLE SCREW FIXATION 1 LEVEL N/A 04/27/2020   Procedure: Transforaminal Lumbar Interbody Fusion Lumbar Five-Sacral One;  Surgeon: Debby Dorn MATSU, MD;  Location: [facility] OR;  Service: Neurosurgery;  Laterality: N/A;   VIDEO BRONCHOSCOPY WITH INSERTION OF INTERBRONCHIAL VALVE (IBV) N/A 11/22/2021   Procedure: VIDEO BRONCHOSCOPY WITH INSERTION OF INTERBRONCHIAL VALVE (IBV);  Surgeon: Kerrin Elspeth BROCKS, MD;  Location: [facility] OR;  Service: Thoracic;  Laterality: N/A;   VIDEO BRONCHOSCOPY WITH INSERTION OF INTERBRONCHIAL VALVE (IBV) N/A 01/24/2022   Procedure: VIDEO BRONCHOSCOPY WITH REMOVAL OF INTERBRONCHIAL VALVE (IBV);  Surgeon: Kerrin Elspeth BROCKS, MD;  Location: [facility] OR;  Service: Thoracic;  Laterality: N/A;   VIDEO BRONCHOSCOPY WITH RADIAL ENDOBRONCHIAL ULTRASOUND  06/11/2021   Procedure: VIDEO BRONCHOSCOPY WITH RADIAL ENDOBRONCHIAL ULTRASOUND;  Surgeon: Shelah Lamar RAMAN, MD;  Location: [facility] ENDOSCOPY;  Service: Pulmonary;;     HPI  from the history and physical done  on the day of admission:   Chief Complaint: Increased heart rate   HPI: Darryl Ward is a 78 y.o. male with medical history significant for atrial fibrillation, systolic and diastolic CHF, COPD, hypertension, stroke, hard of hearing Patient went to see his primary care provider for his posthospitalization follow-up visit, he was found to be in atrial fibrillation, PCP talked to cardiology and he was referred to ED for admission. Patient denies any symptoms, he was unaware of his fast heart rate.  Chest pain or difficulty breathing no dizziness.  He otherwise denies any symptoms or problems, he has been doing well since discharge.   Recently hospitalized-11/16 to 11/22 admitted to [facility], for atrial fibrillation with RVR, initially placed on Cardizem  drip, then discontinued, he had low blood pressure requiring IV fluid, subsequent gradual decrease in his beta-blocker dose.  Underwent successful electrical cardioversion  11/20 and remained in sinus rhythm, he was not placed on GDMT due to AKI and low blood pressure.  Per notes his beta-blocker was discontinued on discharge due to intolerance.  He was also managed for AKI, acute gastroenteritis.   ED Course: Temperature 98.7.  Heart rate initially in the 130s, respiratory rate 13-20.  Blood pressure systolic 107-133.  O2 sats greater 92% on room air. Normal TSH-1.33.  Magnesium  2.  proBNP 385.  Potassium 4. 250 bolus given. EDP spoke to cardiology, recommended admission here, amiodarone  load and drip started.  Cardiology team to see here in consult.   Review of Systems: As per HPI all other systems reviewed and negative.   Hospital Course:   Assessment and Plan: 1. Atrial Fibrillation with RVR - He has a history of paroxysmal atrial fibrillation but had persistent atrial fibrillation during his recent admission earlier this month for gastroenteritis and underwent DCCV with successful conversion back to normal sinus rhythm. Found to be back in atrial fibrillation with RVR at the time of his office visit with his PCP on day of admission. - Electrolytes and TSH WNL.  - Treated with IV amiodarone ,  Patient successfully converted to sinus rhythm with DCCV. On 12/17/23 -Discharge on -Take amiodarone  200 mg --1 tablet twice daily for 5 days and then reduce to 1 tablet once daily after 5 days. This is not ideal long-term given his history of lung cancer but options for medical therapy are limited at this time given his CAD and CHF. Can consider EP referral as an outpatient to assess alternatives. - Continue Eliquis  5 mg twice daily for anticoagulation which is the appropriate dose given his current age, weight and renal function. - Did not tolerate beta-blockers -Avoiding Cardizem  due to borderline low EF   2)HFmrEF - Recent echocardiogram earlier this month showed his EF was mildly reduced at 40 to 45% and unclear if tachycardia-mediated or an ischemic etiology. RV  function was also moderately reduced.   - Recommend follow-up echocardiogram in a few months for reassessment of his EF. - BP limits the addition of GDMT at this time. Could consider an SGLT2i going forward but not started during prior admission given N/V/D and AKI. -, he was not placed on GDMT due to AKI and low blood pressure.   C/n Lasix   3)CAD/HLD - He previously underwent prior PTCA of D1 in 2007 and did have a low-risk NST in 2013. He denies any recent anginal symptoms. He is not on ASA given the need for anticoagulation.  --Okay to continue atorvastatin  -  4) AAA/PVD - He is s/p bilateral aortofemoral  bypass graft in 2013 and CT during recent admission showed dilatation of the infrarenal abdominal aorta measuring 4.4 cm with follow-up imaging recommended in 12 months. - He is already followed by Dr. Sheree with Vascular Surgery.   5)Mild Anemia--multifactorial, patient with underlying lung cancer and history of chemo treatments hemoglobin is stable above 10 - Monitor Hgb closely especially while on Eliquis     COPD-stable.  Resume home regimen   Lung cancer-stage II right upper lobe adenocarcinoma.  Underwent right upper lobectomy and lymph node dissection in 2023, with adjuvant pembrolizumab .  Currently under surveillance.   Discharge Condition: stable  Follow UP   Follow-up Information     Johnson Laymon HERO, PA-C Follow up on 12/31/2023.   Specialties: Cardiology, Cardiology Why: Cardiology Hospital Follow-up on 12/31/2023 at 2:30 PM. Contact information: [address] [place] [address] [phone]                 Consults obtained - Cardiology  Diet and Activity recommendation:  As advised  Discharge Instructions   Discharge Instructions     Call MD for:  difficulty breathing, headache or visual disturbances   Complete by: As directed    Call MD for:  persistant dizziness or light-headedness   Complete by: As directed    Call MD for:  temperature  >100.4   Complete by: As directed    Diet - low sodium heart healthy   Complete by: As directed    Discharge instructions   Complete by: As directed    1)Take amiodarone  200 mg --1 tablet twice daily for 5 days and then reduce to 1 tablet once daily after 5 days 2) stop metoprolol /Toprol -XL 3)Watch for bleeding while on Blood Thinners--watch for blood in your stool which can make your stool black, maroon, mahogany or red---, blood in your urine which can make your urine pink or red, nosebleeds , also watch for possible bruising -You are taking Apixaban /Eliquis --- which is a blood thinner--- be careful to avoid injury or falls  4)Avoid ibuprofen/Advil/Aleve/Motrin/Goody Powders/Naproxen/BC powders/Meloxicam/Diclofenac/Indomethacin and other Nonsteroidal anti-inflammatory medications as these will make you more likely to bleed and can cause stomach ulcers, can also cause Kidney problems.   Increase activity slowly   Complete by: As directed        Discharge Medications     Allergies as of 12/17/2023   No Known Allergies      Medication List     STOP taking these medications    chlorthalidone  25 MG tablet Commonly known as: HYGROTON    metoprolol  succinate 25 MG 24 hr tablet Commonly known as: TOPROL -XL       TAKE these medications    acetaminophen  325 MG tablet Commonly known as: TYLENOL  Take 2 tablets (650 mg total) by mouth every 6 (six) hours as needed for mild pain (pain score 1-3) or fever (or Fever >/= 101).   albuterol  108 (90 Base) MCG/ACT inhaler Commonly known as: VENTOLIN  HFA Inhale 2 puffs into the lungs every 6 (six) hours as needed for wheezing or shortness of breath.   amiodarone  200 MG tablet Commonly known as: Pacerone  Take 1 tablet (200 mg total) by mouth See admin instructions. Take 1 tablet twice daily for 5 days and then reduce to 1 tablet once daily after 5 days   apixaban  5 MG Tabs tablet Commonly known as: Eliquis  Take 1 tablet (5 mg total)  by mouth 2 (two) times daily.   atorvastatin  80 MG tablet Commonly known as: LIPITOR  Take 1  tablet (80 mg total) by mouth daily.   Cholecalciferol  50 MCG (2000 UT) Caps Take 1 capsule by mouth daily.   escitalopram  10 MG tablet Commonly known as: LEXAPRO  Take 1 tablet (10 mg total) by mouth daily.   feeding supplement Liqd Take 237 mLs by mouth 2 (two) times daily between meals.   folic acid  1 MG tablet Commonly known as: KP Folic Acid  Take 1 tablet (1 mg total) by mouth daily.   furosemide  20 MG tablet Commonly known as: LASIX  Take 20 mg by mouth daily.   Incruse Ellipta  62.5 MCG/ACT Aepb Generic drug: umeclidinium bromide  Inhale 1 puff into the lungs daily. What changed:  when to take this reasons to take this   magnesium  oxide 400 (240 Mg) MG tablet Commonly known as: MAG-OX Take 1 tablet (400 mg total) by mouth daily.   methocarbamol  500 MG tablet Commonly known as: ROBAXIN  TAKE 1 TABLET BY MOUTH FOUR TIMES DAILY *NEW PRESCRIPTION REQUEST* What changed: See the new instructions.   pantoprazole  40 MG tablet Commonly known as: PROTONIX  Take 1 tablet (40 mg total) by mouth daily.   tamsulosin  0.4 MG Caps capsule Commonly known as: FLOMAX  Take 1 capsule (0.4 mg total) by mouth daily.       Major procedures and Radiology Reports - PLEASE review detailed and final reports for all details, in brief -   DG CHEST PORT 1 VIEW Result Date: 12/12/2023 EXAM: 1 VIEW(S) XRAY OF THE CHEST 12/12/2023 09:42:00 AM COMPARISON: Comparison 05/14/2023. CLINICAL HISTORY: Oxygen desaturation. FINDINGS: LINES, TUBES AND DEVICES: Stable left internal jugular port-a-cath. LUNGS AND PLEURA: Stable right apical thickening. No pleural effusion. No pneumothorax. HEART AND MEDIASTINUM: No acute abnormality of the cardiac and mediastinal silhouettes. BONES AND SOFT TISSUES: No acute osseous abnormality. IMPRESSION: 1. Stable right apical pleural-parenchymal scarring, unchanged from prior  exam. Electronically signed by: Lynwood Seip MD 12/12/2023 11:22 AM EST RP Workstation: [id number]   EP STUDY Result Date: 12/11/2023 See surgical note for result.  ECHOCARDIOGRAM COMPLETE Result Date: 12/10/2023    ECHOCARDIOGRAM REPORT   Patient Name:   Darryl Ward Date of Exam: 12/10/2023 Medical Rec #:  [id number]       Height:       70.0 in Accession #:    [id number]      Weight:       185.2 lb Date of Birth:  1946/01/11        BSA:          2.020 m Patient Age:    78 years        BP:           126/102 mmHg Patient Gender: M               HR:           94 bpm. Exam Location:  Inpatient Procedure: 2D Echo (Both Spectral and Color Flow Doppler were utilized during            procedure). Indications:    Hypotension  History:        Patient has prior history of Echocardiogram examinations. CAD,                 COPD; Risk Factors:Hypertension.  Sonographer:    Charmaine Gaskins Referring Phys: [id number] H AZOBOU TONLEU  Sonographer Comments: Technically challenging study due to limited acoustic windows, Technically difficult study due to poor echo windows and no subcostal window. Image acquisition challenging due to respiratory motion and  Supine position. IMPRESSIONS  1. Left ventricular ejection fraction, by estimation, is 40 to 45%. The left ventricle has mildly decreased function. The left ventricle demonstrates global hypokinesis. Left ventricular diastolic parameters are indeterminate.  2. Right ventricular systolic function is moderately reduced. The right ventricular size is normal.  3. Left atrial size was moderately dilated.  4. Right atrial size was mildly dilated.  5. The mitral valve is normal in structure. Trivial mitral valve regurgitation. No evidence of mitral stenosis.  6. The aortic valve is tricuspid. There is mild calcification of the aortic valve. Aortic valve regurgitation is not visualized. No aortic stenosis is present. FINDINGS  Left Ventricle: Left ventricular ejection fraction,  by estimation, is 40 to 45%. The left ventricle has mildly decreased function. The left ventricle demonstrates global hypokinesis. The left ventricular internal cavity size was normal in size. There is  no left ventricular hypertrophy. Left ventricular diastolic parameters are indeterminate. Right Ventricle: The right ventricular size is normal. No increase in right ventricular wall thickness. Right ventricular systolic function is moderately reduced. Left Atrium: Left atrial size was moderately dilated. Right Atrium: Right atrial size was mildly dilated. Pericardium: There is no evidence of pericardial effusion. Mitral Valve: The mitral valve is normal in structure. Trivial mitral valve regurgitation. No evidence of mitral valve stenosis. Tricuspid Valve: The tricuspid valve is normal in structure. Tricuspid valve regurgitation is mild . No evidence of tricuspid stenosis. Aortic Valve: The aortic valve is tricuspid. There is mild calcification of the aortic valve. Aortic valve regurgitation is not visualized. No aortic stenosis is present. Pulmonic Valve: The pulmonic valve was normal in structure. Pulmonic valve regurgitation is mild. No evidence of pulmonic stenosis. Aorta: The aortic root is normal in size and structure. Venous: The inferior vena cava was not well visualized. IAS/Shunts: No atrial level shunt detected by color flow Doppler.  LEFT VENTRICLE PLAX 2D LVIDd:         5.20 cm   Diastology LVIDs:         4.00 cm   LV e' medial:    9.67 cm/s LV PW:         1.00 cm   LV E/e' medial:  9.2 LV IVS:        1.00 cm   LV e' lateral:   8.83 cm/s LVOT diam:     2.10 cm   LV E/e' lateral: 10.1 LVOT Area:     3.46 cm  RIGHT VENTRICLE RV Basal diam:  2.60 cm RV Mid diam:    2.40 cm RV S prime:     10.30 cm/s LEFT ATRIUM             Index LA diam:        3.70 cm 1.83 cm/m LA Vol (A2C):   62.4 ml 30.89 ml/m LA Vol (A4C):   82.5 ml 40.84 ml/m LA Biplane Vol: 78.4 ml 38.81 ml/m   AORTA Ao Root diam: 3.70 cm Ao Asc  diam:  3.60 cm MITRAL VALVE               TRICUSPID VALVE MV Area (PHT): 4.58 cm    TR Peak grad:   21.2 mmHg MV E velocity: 88.73 cm/s  TR Vmax:        230.00 cm/s                             SHUNTS  Systemic Diam: 2.10 cm Toribio Fuel MD Electronically signed by Toribio Fuel MD Signature Date/Time: 12/10/2023/2:50:42 PM    Final    CT ABDOMEN PELVIS WO CONTRAST Result Date: 12/07/2023 CLINICAL DATA:  Abdominal pain and vomiting 3 days. History of lung cancer. EXAM: CT ABDOMEN AND PELVIS WITHOUT CONTRAST TECHNIQUE: Multidetector CT imaging of the abdomen and pelvis was performed following the standard protocol without IV contrast. RADIATION DOSE REDUCTION: This exam was performed according to the departmental dose-optimization program which includes automated exposure control, adjustment of the mA and/or kV according to patient size and/or use of iterative reconstruction technique. COMPARISON:  CT chest 09/10/2023 and PET-CT 06/22/2021 FINDINGS: Lower chest: Heart is normal size. Calcified plaque over the left main and 3 vessel coronary arteries. Calcified plaque over the descending thoracic aorta. Visualized lung bases demonstrate no acute findings. Thin walled cyst over the left lower lobe unchanged. Minimal wall thickening of the distal esophagus which may be due to reflux esophagitis. Hepatobiliary: Mild cholelithiasis. Liver and biliary tree are normal. Pancreas: Normal. Spleen: Normal Adrenals/Urinary Tract: Adrenal glands are normal. Kidneys are normal in size. There is no hydronephrosis. Multiple calcifications over the kidneys most of which are likely vascular. Few small bilateral renal cortical hypodensities without significant change likely cysts. Ureters and bladder are normal. Stomach/Bowel: Stomach and small bowel are normal. Appendix is normal. Colon is unremarkable. Vascular/Lymphatic: Evidence of patient's bilateral aortofemoral bypass graft which is  unchanged. 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 Just below the SMA, the aorta measures 3.9 x 4 cm without significant change. Remaining vascular structures are unremarkable. No adenopathy. Reproductive: Prostate is unremarkable. Other: No free fluid or focal inflammatory change. Small midline epigastric ventral hernia containing only peritoneal fat. Musculoskeletal: Right hip arthroplasty intact. Posterior fusion hardware intact at the L5-S1 level. No focal bony abnormality or acute process. IMPRESSION: 1. No acute findings in the abdomen/pelvis. 2. Mild cholelithiasis. 3. Bilateral aortofemoral bypass graft unchanged. 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 Just below the SMA, the aorta measures 3.9 x 4 cm without significant change. Recommend vascular consultation if not done previously as well as surveillance with follow-up CT 12 months. 4. Mild uniform wall thickening of the distal esophagus which may be seen with reflux esophagitis. 5. Small midline epigastric ventral hernia containing only peritoneal fat. 6. Aortic atherosclerosis. Atherosclerotic coronary artery disease. Aortic Atherosclerosis (ICD10-I70.0). Electronically Signed   By: Toribio Agreste M.D.   On: 12/07/2023 17:47   Micro Results  Recent Results (from the past 240 hours)  Culture, blood (single)     Status: None   Collection Time: 12/07/23  5:45 PM   Specimen: BLOOD  Result Value Ref Range Status   Specimen Description   Final    BLOOD RIGHT ANTECUBITAL Performed at [facility], [address], [place], [place] [address]    Special Requests   Final    Blood Culture adequate volume Performed at [facility], [address] [place], [place] [address]    Culture   Final    NO GROWTH 5  DAYS Performed at [facility], [address] [place], [place] [address]    Report Status 12/12/2023 FINAL  Final  MRSA Next Gen by PCR, Nasal     Status: None   Collection Time: 12/07/23  9:09 PM  Specimen: Nasal Mucosa; Nasal Swab  Result Value Ref Range Status   MRSA by PCR Next Gen NOT DETECTED NOT DETECTED Final    Comment: (NOTE) The GeneXpert MRSA Assay (FDA approved for NASAL specimens only), is one component of a comprehensive MRSA colonization surveillance program. It is not intended to diagnose MRSA infection nor to guide or monitor treatment for MRSA infections. Test performance is not FDA approved in patients less than 25 years old. Performed at [facility], [address], [place], [place] [address]     Today   Subjective    Aleene Starch today has no new complaints  No fever  Or chills   No Nausea, Vomiting or Diarrhea - Prior to discharge today patient ambulated around the unit a couple of times without dizziness , palpitations or chest pains - Vital signs including O2 sats with ambulation and post ambulation WNL   Patient has been seen and examined prior to discharge   Objective   Blood pressure 122/61, pulse 64, temperature (!) 97.4 F (36.3 C), temperature source Oral, resp. rate 17, height 5' 10 (1.778 m), weight 79.7 kg, SpO2 100%.   Intake/Output Summary (Last 24 hours) at 12/17/2023 1622 Last data filed at 12/17/2023 1143 Gross per 24 hour  Intake 321.37 ml  Output 650 ml  Net -328.63 ml    Exam Gen:- Awake Alert, no acute distress  HEENT:- [place].AT, No sclera icterus Neck-Supple Neck,No JVD,.  Lungs-  CTAB , good air movement bilaterally CV- S1, S2 normal, regular Abd-  +ve B.Sounds, Abd Soft, No tenderness,    Extremity/Skin:- No  edema,   good pulses Psych-affect is appropriate, oriented x3 Neuro-no new focal deficits, no tremors    Data Review   CBC w Diff:  Lab Results  Component Value Date   WBC  7.2 12/17/2023   HGB 10.9 (L) 12/17/2023   HGB 10.1 (L) 06/12/2022   HCT 32.8 (L) 12/17/2023   HCT 29.8 (L) 06/12/2022   PLT 148 (L) 12/17/2023   PLT 135 (L) 06/12/2022   LYMPHOPCT 13 12/16/2023   MONOPCT 8 12/16/2023   EOSPCT 1 12/16/2023   BASOPCT 1 12/16/2023   CMP:  Lab Results  Component Value Date   NA 139 12/17/2023   NA 142 04/25/2023   K 3.9 12/17/2023   CL 106 12/17/2023   CO2 24 12/17/2023   BUN 14 12/17/2023   BUN 26 04/25/2023   CREATININE 1.18 12/17/2023   PROT 6.5 12/16/2023   PROT 6.4 06/12/2022   ALBUMIN  3.6 12/16/2023   ALBUMIN  3.7 (L) 06/12/2022   BILITOT 0.6 12/16/2023   BILITOT 0.6 06/12/2022   ALKPHOS 67 12/16/2023   AST 24 12/16/2023   ALT 12 12/16/2023  . Total Discharge time is about 33 minutes  Rendall Carwin M.D on 12/17/2023 at 4:22 PM  Go to [email or web address] -  for contact info  [facility] - Office  [phone]

## 2023-12-17 NOTE — Progress Notes (Signed)
   Progress Note  Patient Name: Darryl Ward Date of Encounter: 12/17/2023  Primary Cardiologist: [name], MD  Patient successfully converted to sinus rhythm with DCCV.  Stopping IV amiodarone  on transfer back to ICU.  Anticipate that he can be discharged later this afternoon if otherwise remains clinically stable and in sinus rhythm.  Would discharge on amiodarone  200 mg twice daily for total of 5 days, then decrease dose to once daily.  Do not resume Toprol -XL.  Continue Eliquis  5 mg twice daily.  He already has office follow-up scheduled with us .   For questions or updates, please contact [facility] Please consult [email or web address] for contact info under   Signed, [name], MD  12/17/2023, 12:00 PM

## 2023-12-17 NOTE — Discharge Instructions (Signed)
 1)Take amiodarone  200 mg --1 tablet twice daily for 5 days and then reduce to 1 tablet once daily after 5 days 2) stop metoprolol /Toprol -XL 3)Watch for bleeding while on Blood Thinners--watch for blood in your stool which can make your stool black, maroon, mahogany or red---, blood in your urine which can make your urine pink or red, nosebleeds , also watch for possible bruising -You are taking Apixaban /Eliquis --- which is a blood thinner--- be careful to avoid injury or falls  4)Avoid ibuprofen/Advil/Aleve/Motrin/Goody Powders/Naproxen/BC powders/Meloxicam/Diclofenac/Indomethacin and other Nonsteroidal anti-inflammatory medications as these will make you more likely to bleed and can cause stomach ulcers, can also cause Kidney problems.

## 2023-12-17 NOTE — Progress Notes (Signed)
 Went over discharge instructions w/ pt and wife.

## 2023-12-17 NOTE — Progress Notes (Signed)
 Electrical Cardioversion Procedure Note Darryl Ward [id number] 08/30/1945  Procedure: Electrical Cardioversion Indications:  Atrial Fibrillation  Procedure Details Consent: Risks of procedure as well as the alternatives and risks of each were explained to the (patient/caregiver).  Consent for procedure obtained. Time Out: Verified patient identification, verified procedure, site/side was marked, verified correct patient position, special equipment/implants available, medications/allergies/relevent history reviewed, required imaging and test results available.  {time out performed: 1136  Patient placed on cardiac monitor, pulse oximetry, supplemental oxygen as necessary.  Sedation given: propofol   Pacer pads placed anterior and posterior chest.  Cardioverted 1 time(s).  Cardioverted at 150J.  Evaluation Findings: Post procedure EKG shows: NSR Complications: None Patient did tolerate procedure well.   [name] Page 12/17/2023, 11:49 AM

## 2023-12-17 NOTE — CV Procedure (Signed)
 Elective direct-current cardioversion  Indication: Persistent atrial fibrillation  Description of procedure: Informed consent obtained and patient taken to the PACU where a timeout was performed.  Defibrillator pads were placed in anterior/posterior position and connected to a biphasic defibrillator.  Deep sedation was achieved via the anesthesia service with use of propofol , please refer to anesthesia records for dosing details.  With sandbag on anterior chest pad, a single synchronized 150 J shock was delivered with successful restoration of sinus rhythm.  Patient remained hemodynamically stable throughout and there were no immediate complications.  Sinus rhythm confirmed by ECG.  Disposition: After appropriate observation patient will be taken back to the ICU for further management.  Darryl Ward, M.D., F.A.C.C.

## 2023-12-17 NOTE — Plan of Care (Signed)
  Problem: Education: Goal: Knowledge of General Education information will improve Description: Including pain rating scale, medication(s)/side effects and non-pharmacologic comfort measures Outcome: Progressing   Problem: Health Behavior/Discharge Planning: Goal: Ability to manage health-related needs will improve Outcome: Progressing   Problem: Clinical Measurements: Goal: Respiratory complications will improve Outcome: Progressing   

## 2023-12-17 NOTE — Progress Notes (Signed)
 Pt walked around unit. HR up to 100 and o2 sats stayed around 94-95% on room air. HR is now 70 and o2 sat is 100% on room air at rest.

## 2023-12-17 NOTE — Consult Note (Signed)
 Cardiology Consultation   Patient ID: Darryl Ward MRN: [id number]; DOB: Sep 25, 1945  Admit date: 12/16/2023 Date of Consult: 12/17/2023  PCP:  Darryl Jacqulyn MATSU, DO   [facility] Providers Cardiologist:  Darryl Sierras, MD        Patient Profile: Darryl Ward is a 78 y.o. 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 Pearlean.  History of Present Illness: [name] was recently admitted to [facility] from 11/16 - 12/13/2023 for acute gastritis with associated nausea, vomiting and diarrhea. Cardiology was consulted as he was found to be in atrial fibrillation with RVR and initially on IV Cardizem . He was switched to Lopressor  given issues with hypotension and ultimately underwent successful DCCV on 12/11/2023 with conversion back to normal sinus rhythm with a synchronized biphasic 200J shock. He did have hypotension following the procedure and beta-blocker therapy was discontinued with plans to consider resumption as an outpatient.  Echocardiogram during admission also showed his EF was mildly reduced at 40 to 45% and was recommended to repeat a TTE in 2 to 3 months for reassessment. Additional GDMT was deferred given his recent AKI and soft BP.  Was recommended to have follow-up with Vascular as an outpatient given his 4.4 cm AAA. At the time of discharge, Chlorthalidone , Lasix  and Toprol -XL were held with him being continued on Eliquis  5 mg twice daily and Atorvastatin  80 mg daily.  He was evaluated by his PCP yesterday for hospital follow-up and reported feeling weak and fatigued.  He was found to be  back in atrial fibrillation with RVR and was sent to the hospital for further evaluation.  Initial labs showed WBC 8.2, Hgb 12.0, platelets 168, Na+ 137, K+ 4.0 and creatinine 1.28 (baseline 0.9 - 1.0). Mg 2.0. TSH 1.330. proBNP mildly elevated at 385. EKG showed atrial fibrillation with RVR, heart rate 136. He received a 250 mL fluid bolus and was started on IV Amiodarone  while being continued on Eliquis  for anticoagulation.  In talking with the patient today, he reports he initially felt well upon going home but developed more fatigue and weakness yesterday. He denies any specific chest pain or palpitations. Was unaware that he was back in atrial fibrillation until he had his office visit with his PCP. He denies any recurrent nausea, vomiting or diarrhea since his recent admission. Says that his appetite has been improving. He reports good compliance with Eliquis  and has been taking this twice daily and has not missed any doses per his report. He feels back to baseline this morning.  Past Medical History:  Diagnosis Date   AAA (abdominal aortic aneurysm)    Anemia    Arthritis    Blood transfusion without reported diagnosis 10/2021   Broken hip (HCC)    Cancer (HCC)    Carotid artery disease    Nonobstructive   Cataract    CHF (congestive heart failure) (HCC)    COPD (chronic obstructive pulmonary disease) (HCC)    Coronary atherosclerosis of native coronary artery    PTCA small diagonal 2007 otherwise nonobstructive CAD   Depression    Dysrhythmia  Essential hypertension, benign    GERD (gastroesophageal reflux disease)    Hyperlipidemia    NSTEMI (non-ST elevated myocardial infarction) (HCC) 2007   Stroke [facility]) 2004   Stroke [facility]) 2025   TIA (transient ischemic attack) 2006    Past Surgical History:  Procedure Laterality Date   AORTA - BILATERAL FEMORAL ARTERY BYPASS GRAFT  01/08/2012   Procedure: AORTA BIFEMORAL BYPASS GRAFT;  Surgeon: Darryl FORBES Haddock, MD;  Location: [facility] OR;   Service: Vascular;  Laterality: Bilateral;  using 18x33mm x 40cm Hemashield Gold Vascular Graft with Endarterectomy, Thombectomy and  Reimplantation of Inferior Mesenteric Artery   BACK SURGERY  2021   BRONCHIAL BIOPSY  06/11/2021   Procedure: BRONCHIAL BIOPSIES;  Surgeon: Darryl Lamar RAMAN, MD;  Location: [facility] ENDOSCOPY;  Service: Pulmonary;;   BRONCHIAL BRUSHINGS  06/11/2021   Procedure: BRONCHIAL BRUSHINGS;  Surgeon: Darryl Lamar RAMAN, MD;  Location: [facility] ENDOSCOPY;  Service: Pulmonary;;   BRONCHIAL NEEDLE ASPIRATION BIOPSY  06/11/2021   Procedure: BRONCHIAL NEEDLE ASPIRATION BIOPSIES;  Surgeon: Darryl Lamar RAMAN, MD;  Location: [facility] ENDOSCOPY;  Service: Pulmonary;;   CARDIOVERSION N/A 09/20/2021   Procedure: CARDIOVERSION;  Surgeon: Darryl Darryl MATSU, MD;  Location: [facility] ORS;  Service: Cardiovascular;  Laterality: N/A;   CARDIOVERSION N/A 12/11/2023   Procedure: CARDIOVERSION;  Surgeon: Darryl Headland, MD;  Location: [facility] INVASIVE CV LAB;  Service: Cardiovascular;  Laterality: N/A;   COLONOSCOPY N/A 04/14/2019   Procedure: COLONOSCOPY;  Surgeon: Darryl Lamar HERO, MD;  Location: [facility] ENDO SUITE;  Service: Endoscopy;  Laterality: N/A;  9:30   COLONOSCOPY WITH PROPOFOL  N/A 07/25/2021   Procedure: COLONOSCOPY WITH PROPOFOL ;  Surgeon: Darryl Darryl POUR, DO;  Location: [facility] ENDO SUITE;  Service: Endoscopy;  Laterality: N/A;  1:00pm   EYE SURGERY     FIDUCIAL MARKER PLACEMENT  06/11/2021   Procedure: FIDUCIAL MARKER PLACEMENT;  Surgeon: Darryl Lamar RAMAN, MD;  Location: [facility] ENDOSCOPY;  Service: Pulmonary;;   FRACTURE SURGERY     HIP ARTHROPLASTY Right 04/28/2022   Procedure: ARTHROPLASTY BIPOLAR HIP (HEMIARTHROPLASTY);  Surgeon: Darryl Evalene BIRCH, MD;  Location: [facility] OR;  Service: Orthopedics;  Laterality: Right;   INTERCOSTAL NERVE BLOCK Right 11/12/2021   Procedure: INTERCOSTAL NERVE BLOCK;  Surgeon: Darryl Elspeth BROCKS, MD;  Location: [facility] OR;  Service: Thoracic;  Laterality: Right;   IR IMAGING GUIDED PORT INSERTION   07/31/2021   JOINT REPLACEMENT     Left cataract surgery     LYMPH NODE DISSECTION Right 11/12/2021   Procedure: LYMPH NODE DISSECTION;  Surgeon: Darryl Elspeth BROCKS, MD;  Location: [facility] OR;  Service: Thoracic;  Laterality: Right;   POLYPECTOMY  04/14/2019   Procedure: POLYPECTOMY;  Surgeon: Darryl Lamar HERO, MD;  Location: [facility] ENDO SUITE;  Service: Endoscopy;;   POLYPECTOMY  07/25/2021   Procedure: POLYPECTOMY;  Surgeon: Darryl Darryl POUR, DO;  Location: [facility] ENDO SUITE;  Service: Endoscopy;;   SPINE SURGERY     TEE WITHOUT CARDIOVERSION N/A 09/20/2021   Procedure: TRANSESOPHAGEAL ECHOCARDIOGRAM (TEE);  Surgeon: Darryl Darryl MATSU, MD;  Location: [facility] ORS;  Service: Cardiovascular;  Laterality: N/A;   TOTAL HIP ARTHROPLASTY Right 05/15/2023   Procedure: RIGHT HIP HEMIARTHROPLASTY REVISION WITH OPEN REDUCTION INTERNAL FIXATION OF PERIPROSTHETIC FEMUR FRACTURE;  Surgeon: Darryl Evalene BIRCH, MD;  Location: [facility] ORS;  Service: Orthopedics;  Laterality: Right;   TRANSFORAMINAL LUMBAR INTERBODY FUSION (TLIF) WITH PEDICLE SCREW FIXATION 1 LEVEL N/A 04/27/2020   Procedure: Transforaminal Lumbar Interbody Fusion Lumbar Five-Sacral One;  Surgeon: Debby Dorn MATSU, MD;  Location: [facility]  OR;  Service: Neurosurgery;  Laterality: N/A;   VIDEO BRONCHOSCOPY WITH INSERTION OF INTERBRONCHIAL VALVE (IBV) N/A 11/22/2021   Procedure: VIDEO BRONCHOSCOPY WITH INSERTION OF INTERBRONCHIAL VALVE (IBV);  Surgeon: Darryl Elspeth BROCKS, MD;  Location: [facility] OR;  Service: Thoracic;  Laterality: N/A;   VIDEO BRONCHOSCOPY WITH INSERTION OF INTERBRONCHIAL VALVE (IBV) N/A 01/24/2022   Procedure: VIDEO BRONCHOSCOPY WITH REMOVAL OF INTERBRONCHIAL VALVE (IBV);  Surgeon: Darryl Elspeth BROCKS, MD;  Location: [facility] OR;  Service: Thoracic;  Laterality: N/A;   VIDEO BRONCHOSCOPY WITH RADIAL ENDOBRONCHIAL ULTRASOUND  06/11/2021   Procedure: VIDEO BRONCHOSCOPY WITH RADIAL ENDOBRONCHIAL ULTRASOUND;  Surgeon: Darryl Lamar RAMAN, MD;  Location: [facility]  ENDOSCOPY;  Service: Pulmonary;;     Home Medications:  Prior to Admission medications   Medication Sig Start Date End Date Taking? Authorizing Provider  acetaminophen  (TYLENOL ) 325 MG tablet Take 2 tablets (650 mg total) by mouth every 6 (six) hours as needed for mild pain (pain score 1-3) or fever (or Fever >/= 101). 12/13/23   [name] Toribio GAILS, MD  albuterol  (VENTOLIN  HFA) 108 [phone] Base) MCG/ACT inhaler Inhale 2 puffs into the lungs every 6 (six) hours as needed for wheezing or shortness of breath. 10/10/23   Cobb, Comer GAILS, NP  apixaban  (ELIQUIS ) 5 MG TABS tablet Take 1 tablet (5 mg total) by mouth 2 (two) times daily. 10/01/23   Cook, Jayce G, DO  atorvastatin  (LIPITOR ) 80 MG tablet Take 1 tablet (80 mg total) by mouth daily. 10/01/23   Cook, Jayce G, DO  Cholecalciferol  50 MCG (2000 UT) CAPS Take 1 capsule by mouth daily.    [provider]  escitalopram  (LEXAPRO ) 10 MG tablet Take 1 tablet (10 mg total) by mouth daily. 10/01/23   Cook, Jayce G, DO  feeding supplement (ENSURE PLUS HIGH PROTEIN) LIQD Take 237 mLs by mouth 2 (two) times daily between meals. 12/13/23   [name] Toribio GAILS, MD  fluticasone  (FLONASE ) 50 MCG/ACT nasal spray Place 2 sprays into both nostrils daily. Patient not taking: Reported on 12/16/2023 06/23/23   Malachy Comer GAILS, NP  folic acid  (KP FOLIC ACID ) 1 MG tablet Take 1 tablet (1 mg total) by mouth daily. 10/01/23   Cook, Jayce G, DO  magnesium  oxide (MAG-OX) 400 (240 Mg) MG tablet Take 1 tablet (400 mg total) by mouth daily. 10/01/23   Cook, Jayce G, DO  methocarbamol  (ROBAXIN ) 500 MG tablet TAKE 1 TABLET BY MOUTH FOUR TIMES DAILY *NEW PRESCRIPTION REQUEST* 09/26/23   Cook, Jayce G, DO  metoprolol  succinate (TOPROL -XL) 25 MG 24 hr tablet Take 1 tablet (25 mg total) by mouth daily. Patient not taking: Reported on 12/16/2023 10/01/23   Cook, Jayce G, DO  pantoprazole  (PROTONIX ) 40 MG tablet Take 1 tablet (40 mg total) by mouth daily. 10/01/23   Cook, Jayce G, DO   tamsulosin  (FLOMAX ) 0.4 MG CAPS capsule Take 1 capsule (0.4 mg total) by mouth daily. 10/01/23   Cook, Jayce G, DO  umeclidinium bromide  (INCRUSE ELLIPTA ) 62.5 MCG/ACT AEPB Inhale 1 puff into the lungs daily. Patient taking differently: Inhale 1 puff into the lungs daily as needed (wheezing/sob). 10/10/23   Cobb, Comer GAILS, NP    Scheduled Meds:  apixaban   5 mg Oral BID   Chlorhexidine  Gluconate Cloth  6 each Topical Daily   Continuous Infusions:  amiodarone  30 mg/hr (12/17/23 0643)   PRN Meds: acetaminophen  **OR** acetaminophen , ondansetron  **OR** ondansetron  (ZOFRAN ) IV, polyethylene glycol  Allergies:   No Known Allergies  Social History:   Social  History   Socioeconomic History   Marital status: Divorced    Spouse name: Not on file   Number of children: 1   Years of education: 29   Highest education level: 11th grade  Occupational History    Employer: [employer]  Tobacco Use   Smoking status: Former    Current packs/day: 0.00    Average packs/day: 1 pack/day for 40.0 years (40.0 ttl pk-yrs)    Types: Cigarettes    Start date: 07/1981    Quit date: 07/2021    Years since quitting: 2.4   Smokeless tobacco: Never   Tobacco comments:    1 pack of cigarettes smoked daily. 07/17/21 ARJ, RN   Vaping Use   Vaping status: Never Used  Substance and Sexual Activity   Alcohol use: No    Comment: Prior history of regular alcohol use   Drug use: No   Sexual activity: Not Currently  Other Topics Concern   Not on file  Social History Narrative   Not on file    Family History:    Family History  Problem Relation Age of Onset   Hearing loss Mother    Miscarriages / Stillbirths Mother    Vision loss Mother    Hyperlipidemia Sister    Hypertension Sister    Stroke Sister    Heart attack Brother 26   Cancer Brother    Hearing loss Brother    Heart disease Brother    Hypertension Brother    Hearing loss Brother    Heart disease Brother    Hypertension  Brother    Cancer - Colon Neg Hx      ROS:  Please see the history of present illness.   All other ROS reviewed and negative.     Physical Exam/Data: Vitals:   12/17/23 0500 12/17/23 0600 12/17/23 0700 12/17/23 0800  BP: 120/82 (!) 124/90 120/77 108/72  Pulse: (!) 115 (!) 121 94 (!) 58  Resp: 15 13  15   Temp:    98 F (36.7 C)  TempSrc:    Other (Comment)  SpO2: 97% 100% 97% 91%  Weight:      Height:        Intake/Output Summary (Last 24 hours) at 12/17/2023 0901 Last data filed at 12/17/2023 0643 Gross per 24 hour  Intake 321.37 ml  Output 650 ml  Net -328.63 ml      12/16/2023    9:25 PM 12/16/2023    3:53 PM 12/16/2023    2:13 PM  Last 3 Weights  Weight (lbs) 175 lb 11.3 oz 179 lb 0.2 oz 179 lb  Weight (kg) 79.7 kg 81.2 kg 81.194 kg     Body mass index is 25.21 kg/m.  General:  Well nourished, well developed male appearing in no acute distress HEENT: normal Neck: no JVD Vascular: No carotid bruits; Distal pulses 2+ bilaterally Cardiac:  normal S1, S2; irregular irregular Lungs:  clear to auscultation bilaterally, no wheezing, rhonchi or rales  Abd: soft, nontender, no hepatomegaly  Ext: no pitting edema Musculoskeletal:  No deformities, BUE and BLE strength normal and equal Skin: warm and dry  Neuro:  CNs 2-12 intact, no focal abnormalities noted Psych:  Normal affect   Telemetry:  Telemetry was personally reviewed and demonstrates: Atrial fibrillation with RVR, heart rate in the 110's to 120's with occasional PVC's and couplets.  Relevant CV Studies:  Echocardiogram: 11/2023   1. Left ventricular ejection fraction, by estimation, is 40 to 45%. The  left  ventricle has mildly decreased function. The left ventricle  demonstrates global hypokinesis. Left ventricular diastolic parameters are  indeterminate.   2. Right ventricular systolic function is moderately reduced. The right  ventricular size is normal.   3. Left atrial size was moderately  dilated.   4. Right atrial size was mildly dilated.   5. The mitral valve is normal in structure. Trivial mitral valve  regurgitation. No evidence of mitral stenosis.   6. The aortic valve is tricuspid. There is mild calcification of the  aortic valve. Aortic valve regurgitation is not visualized. No aortic  stenosis is present.   Laboratory Data: High Sensitivity Troponin:  No results for input(s): TROPONINIHS in the last 720 hours.   Chemistry Recent Labs  Lab 12/12/23 0417 12/13/23 0524 12/16/23 1614 12/17/23 0507  NA 139 137 137 139  K 3.7 3.8 4.0 3.9  CL 108 106 104 106  CO2 23 24 24 24   GLUCOSE 90 101* 111* 88  BUN 18 16 16 14   CREATININE 1.04 0.98 1.28* 1.18  CALCIUM  8.7* 8.7* 9.1 8.5*  MG 2.1 1.9 2.0  --   GFRNONAA >60 >60 58* >60  ANIONGAP 7 8 9 8     Recent Labs  Lab 12/11/23 0458 12/16/23 1614  PROT  --  6.5  ALBUMIN  3.0* 3.6  AST  --  24  ALT  --  12  ALKPHOS  --  67  BILITOT  --  0.6   Lipids No results for input(s): CHOL, TRIG, HDL, LABVLDL, LDLCALC, CHOLHDL in the last 168 hours.  Hematology Recent Labs  Lab 12/13/23 0524 12/16/23 1614 12/17/23 0507  WBC 8.5 8.2 7.2  RBC 3.26* 3.71* 3.42*  HGB 10.2* 12.0* 10.9*  HCT 31.0* 35.6* 32.8*  MCV 95.1 96.0 95.9  MCH 31.3 32.3 31.9  MCHC 32.9 33.7 33.2  RDW 14.2 14.5 14.4  PLT 137* 168 148*   Thyroid   Recent Labs  Lab 12/16/23 1614  TSH 1.330    BNP Recent Labs  Lab 12/16/23 1614  PROBNP 385.0*    DDimer No results for input(s): DDIMER in the last 168 hours.  Radiology/Studies:  No results found.   Assessment and Plan:  1. Atrial Fibrillation with RVR - He has a history of paroxysmal atrial fibrillation but had persistent atrial fibrillation during his recent admission earlier this month for gastroenteritis and underwent DCCV with successful conversion back to normal sinus rhythm. Found to be back in atrial fibrillation with RVR at the time of his office visit with his  PCP yesterday. - Electrolytes and TSH WNL. He has been on IV Amiodarone  as BP did not allow for the use of Cardizem  or beta-blocker therapy. Previously discussed with Dr. Debera and will plan for DCCV today given compliance with Eliquis . Scheduled for 1130 and orders entered. Plan to switch to PO Amiodarone  following the procedure with a loading dose and this can be reduced as an outpatient. This is not ideal long-term given his history of lung cancer but options for medical therapy are limited at this time given his CAD and CHF. Can consider EP referral as an outpatient to assess alternatives. - Continue Eliquis  5 mg twice daily for anticoagulation which is the appropriate dose given his current age, weight and renal function.  2. HFmrEF - Recent echocardiogram earlier this month showed his EF was mildly reduced at 40 to 45% and unclear if tachycardia-mediated or an ischemic etiology. RV function was also moderately reduced.   - Would plan for  a follow-up echocardiogram in several months for reassessment of his EF. - BP limits the addition of GDMT at this time. Could consider an SGLT2i going forward but not started during prior admission given N/V/D and AKI.   3. CAD/HLD - He previously underwent prior PTCA of D1 in 2007 and did have a low-risk NST in 2013. He denies any recent anginal symptoms. He is not on ASA given the need for anticoagulation. On Atorvastatin  80 mg daily as an outpatient and this can be resumed at the time of discharge.  4.  AAA/PVD - He is s/p bilateral aortofemoral bypass graft in 2013 and CT during recent admission showed dilatation of the infrarenal abdominal aorta measuring 4.4 cm with follow-up imaging recommended in 12 months. - He is already followed by Dr. Sheree with Vascular Surgery.    Informed Consent   Shared Decision Making/Informed Consent{ The risks (stroke, cardiac arrhythmias rarely resulting in the need for a temporary or permanent pacemaker, skin  irritation or burns and complications associated with conscious sedation including aspiration, arrhythmia, respiratory failure and death), benefits (restoration of normal sinus rhythm) and alternatives of a direct current cardioversion were explained in detail to Mr. Torr and he agrees to proceed.   Risk Assessment/Risk Scores: CHA2DS2-VASc Score = 7  This indicates a 11.2% annual risk of stroke. The patient's score is based upon: CHF History: 1 HTN History: 1 Diabetes History: 0 Stroke History: 2 Vascular Disease History: 1 Age Score: 2 Gender Score: 0    For questions or updates, please contact [facility] Please consult [email or web address] for contact info under   Signed, Laymon CHRISTELLA Qua, PA-C  12/17/2023 9:01 AM

## 2023-12-17 NOTE — TOC Initial Note (Signed)
 Transition of Care [facility]) - Initial/Assessment Note    Patient Details  Name: Darryl Ward MRN: [id number] Date of Birth: [birth year]  Transition of Care [facility]) CM/SW Contact:    Lucie Lunger, LCSWA Phone Number: 12/17/2023, 11:09 AM  Clinical Narrative:                 Pt is high risk for readmission. CSW met with pt at bedside to complete assessment. Pt states that he lives with his wife. Pt is independent in completing his ADLs. Pts wife provides transport when needed. Pt states he has HH PT currently with [facility]. Pt has a walker to use in the home when needed. TOC to follow.   Expected Discharge Plan: Home w Home Health Services Barriers to Discharge: Continued Medical Work up   Patient Goals and CMS Choice Patient states their goals for this hospitalization and ongoing recovery are:: return home CMS Medicare.gov Compare Post Acute Care list provided to:: Patient Choice offered to / list presented to : Patient      Expected Discharge Plan and Services In-house Referral: Clinical Social Work Discharge Planning Services: CM Consult Post Acute Care Choice: Home Health Living arrangements for the past 2 months: Single Family Home                                      Prior Living Arrangements/Services Living arrangements for the past 2 months: Single Family Home Lives with:: Spouse Patient language and need for interpreter reviewed:: Yes Do you feel safe going back to the place where you live?: Yes      Need for Family Participation in Patient Care: Yes (Comment) Care giver support system in place?: Yes (comment) Current home services: DME, Home PT Criminal Activity/Legal Involvement Pertinent to Current Situation/Hospitalization: No - Comment as needed  Activities of Daily Living   ADL Screening (condition at time of admission) Independently performs ADLs?: Yes (appropriate for developmental age) Is the patient deaf or have difficulty hearing?: No Does the  patient have difficulty seeing, even when wearing glasses/contacts?: No Does the patient have difficulty concentrating, remembering, or making decisions?: No  Permission Sought/Granted                  Emotional Assessment Appearance:: Appears stated age Attitude/Demeanor/Rapport: Engaged Affect (typically observed): Accepting Orientation: : Oriented to Self, Oriented to  Time, Oriented to Place, Oriented to Situation Alcohol / Substance Use: Not Applicable Psych Involvement: No (comment)  Admission diagnosis:  Atrial fibrillation with rapid ventricular response (HCC) [I48.91] Atrial fibrillation with RVR (HCC) [I48.91] Patient Active Problem List   Diagnosis Date Noted   Dilation of aorta 12/16/2023   Gastroenteritis 12/10/2023   Hypokalemia 12/10/2023   Hypomagnesemia 12/10/2023   Atrial fibrillation with rapid ventricular response (HCC) 12/07/2023   History of hip fracture 09/25/2023   Voice hoarseness 06/23/2023   Malnutrition of moderate degree 05/15/2023   Urinary incontinence 03/31/2023   Recurrent falls 03/11/2023   GERD without esophagitis 03/11/2023   Depression 03/11/2023   Benign prostatic hyperplasia with lower urinary tract symptoms 06/13/2022   Normocytic anemia 04/28/2022   Iron deficiency anemia 04/22/2022   History of stroke 03/27/2022   Chronic combined systolic and diastolic CHF (congestive heart failure) (HCC) 03/27/2022   Essential hypertension 09/30/2021   Primary adenocarcinoma of upper lobe of right lung (HCC) 06/26/2021   COPD (chronic obstructive pulmonary disease) (HCC) 05/29/2021  Meningioma, cerebral (HCC) 02/05/2020   Chronic midline low back pain without sciatica 04/27/2019   Tobacco abuse 11/17/2018   PVD (peripheral vascular disease) (HCC) 05/21/2012   Mixed hyperlipidemia 12/13/2011   Coronary atherosclerosis of native coronary artery 12/13/2011   PCP:  Cook, Jayce G, DO Pharmacy:   [facility] - [address] [place] - [address] [place] [address] Phone: [phone] Fax: [phone]  [facility] - [facility] [address] [place] [address] Phone: [phone] Fax: [phone]  [facility] - [place], [place] - [address] [place] [address] Phone: [phone] Fax: [phone]  [facility] [address], [place] - [address] [place] [address] [place] [address] [place] [address] Phone: [phone] Fax: [phone]     Social Drivers of Health (SDOH) Social History: SDOH Screenings   Food Insecurity: No Food Insecurity (12/17/2023)  Housing: High Risk (12/17/2023)  Transportation Needs: Unmet Transportation Needs (12/17/2023)  Utilities: Not At Risk (12/17/2023)  Alcohol Screen: Low Risk  (08/30/2022)  Depression (PHQ2-9): Low Risk  (12/16/2023)  Recent Concern: Depression (PHQ2-9) - Medium Risk (11/28/2023)  Financial Resource Strain: Medium Risk (12/15/2023)  Physical Activity: Inactive (12/15/2023)  Social Connections: Socially Isolated (12/17/2023)  Stress: No Stress Concern Present (12/15/2023)  Tobacco Use: Medium Risk (12/17/2023)  Health Literacy: Adequate Health Literacy (11/28/2023)   SDOH Interventions:     Readmission Risk Interventions    12/17/2023   11:07 AM 12/09/2023    3:52 PM 03/13/2023   11:54 AM  Readmission Risk Prevention Plan  Transportation Screening Complete Complete Complete  PCP or Specialist Appt within 5-7 Days  Complete   Home Care Screening  Complete   Medication Review (RN CM)  Complete   HRI or Home Care Consult Complete  Complete  Social Work Consult for Recovery Care Planning/Counseling Complete  Complete  Palliative Care Screening   Not Applicable  Medication Review Oceanographer) Complete  Complete

## 2023-12-17 NOTE — Plan of Care (Signed)
  Problem: Education: Goal: Knowledge of General Education information will improve Description: Including pain rating scale, medication(s)/side effects and non-pharmacologic comfort measures Outcome: Completed/Met   Problem: Health Behavior/Discharge Planning: Goal: Ability to manage health-related needs will improve Outcome: Completed/Met   Problem: Clinical Measurements: Goal: Ability to maintain clinical measurements within normal limits will improve Outcome: Completed/Met Goal: Will remain free from infection Outcome: Completed/Met Goal: Diagnostic test results will improve Outcome: Completed/Met Goal: Respiratory complications will improve Outcome: Completed/Met Goal: Cardiovascular complication will be avoided Outcome: Completed/Met   Problem: Activity: Goal: Risk for activity intolerance will decrease Outcome: Completed/Met   Problem: Nutrition: Goal: Adequate nutrition will be maintained Outcome: Completed/Met   Problem: Coping: Goal: Level of anxiety will decrease Outcome: Completed/Met   Problem: Elimination: Goal: Will not experience complications related to bowel motility Outcome: Completed/Met Goal: Will not experience complications related to urinary retention Outcome: Completed/Met   Problem: Pain Managment: Goal: General experience of comfort will improve and/or be controlled Outcome: Completed/Met   Problem: Safety: Goal: Ability to remain free from injury will improve Outcome: Completed/Met   Problem: Skin Integrity: Goal: Risk for impaired skin integrity will decrease Outcome: Completed/Met   Problem: Education: Goal: Knowledge of disease or condition will improve Outcome: Completed/Met Goal: Understanding of medication regimen will improve Outcome: Completed/Met Goal: Individualized Educational Video(s) Outcome: Completed/Met   Problem: Activity: Goal: Ability to tolerate increased activity will improve Outcome: Completed/Met    Problem: Cardiac: Goal: Ability to achieve and maintain adequate cardiopulmonary perfusion will improve Outcome: Completed/Met   Problem: Health Behavior/Discharge Planning: Goal: Ability to safely manage health-related needs after discharge will improve Outcome: Completed/Met

## 2023-12-19 ENCOUNTER — Telehealth: Payer: Self-pay | Admitting: *Deleted

## 2023-12-19 ENCOUNTER — Other Ambulatory Visit (HOSPITAL_COMMUNITY): Payer: Self-pay

## 2023-12-19 NOTE — Transitions of Care (Post Inpatient/ED Visit) (Signed)
 12/19/2023  Name: Darryl Ward MRN: [id number] DOB: 1945-07-22  Today's TOC FU Call Status: Today's TOC FU Call Status:: Successful TOC FU Call Completed TOC FU Call Complete Date: 12/19/23  Patient's Name and Date of Birth confirmed. Name, DOB (verfied with DPR/Terry)  Transition Care Management Follow-up Telephone Call Date of Discharge: 12/17/23 [facility] Type of Discharge: Inpatient Admission Primary Inpatient Discharge Diagnosis:: Atrial fibrillation with rapid ventricular response How have you been since you were released from the hospital?: Worse (Nausea and Vomiting from 1am till 8am today) Any questions or concerns?: No  Items Reviewed: Did you receive and understand the discharge instructions provided?: Yes Medications obtained,verified, and reconciled?: Yes (Medications Reviewed) Any new allergies since your discharge?: No Dietary orders reviewed?: Yes Type of Diet Ordered:: low sodium heart healthy Do you have support at home?: Yes People in Home [RPT]: spouse Name of Support/Comfort Primary Source: spouse/[name]  Medications Reviewed Today: Medications Reviewed Today     Reviewed by Lucky Andrea LABOR, RN (Registered Nurse) on 12/19/23 at 1417  Med List Status: <None>   Medication Order Taking? Sig Documenting Provider Last Dose Status Informant  acetaminophen  (TYLENOL ) 325 MG tablet [id number] Yes Take 2 tablets (650 mg total) by mouth every 6 (six) hours as needed for mild pain (pain score 1-3) or fever (or Fever >/= 101). [name] Toribio GAILS, MD  Active Spouse/Significant Other, Pharmacy Records  albuterol  (VENTOLIN  HFA) 108 [phone] Base) MCG/ACT inhaler [id number] Yes Inhale 2 puffs into the lungs every 6 (six) hours as needed for wheezing or shortness of breath. Pearlean Manus, MD  Active   amiodarone  (PACERONE ) 200 MG tablet [id number] Yes Take 1 tablet (200 mg total) by mouth See admin instructions. Take 1 tablet twice daily for 5 days and  then reduce to 1 tablet once daily after 5 days Pearlean Manus, MD  Active   apixaban  (ELIQUIS ) 5 MG TABS tablet [id number] Yes Take 1 tablet (5 mg total) by mouth 2 (two) times daily. Pearlean Manus, MD  Active   atorvastatin  (LIPITOR ) 80 MG tablet [id number] Yes Take 1 tablet (80 mg total) by mouth daily. Cook, Jayce G, DO  Active Spouse/Significant Other, Pharmacy Records  Cholecalciferol  50 MCG [phone] UT) CAPS [id number] Yes Take 1 capsule by mouth daily. [provider]  Active Spouse/Significant Other, Pharmacy Records  escitalopram  (LEXAPRO ) 10 MG tablet [id number] Yes Take 1 tablet (10 mg total) by mouth daily. Cook, Jayce G, DO  Active Spouse/Significant Other, Pharmacy Records  feeding supplement (ENSURE PLUS HIGH PROTEIN) LIQD [id number] Yes Take 237 mLs by mouth 2 (two) times daily between meals. [name] Toribio GAILS, MD  Active Spouse/Significant Other, Pharmacy Records  folic acid  (KP FOLIC ACID ) 1 MG tablet [id number] Yes Take 1 tablet (1 mg total) by mouth daily. Cook, Jayce G, DO  Active Spouse/Significant Other, Pharmacy Records  furosemide  (LASIX ) 20 MG tablet [id number]  Take 20 mg by mouth daily.  Patient not taking: Reported on 12/19/2023   [provider]  Active Spouse/Significant Other, Pharmacy Records           Med Note (WARD, CHUCK MATSU   Wed Dec 17, 2023  2:20 PM) On HOLD per pt's spouse  magnesium  oxide (MAG-OX) 400 (240 Mg) MG tablet [id number] Yes Take 1 tablet (400 mg total) by mouth daily. Cook, Jayce G, DO  Active Spouse/Significant Other, Pharmacy Records  methocarbamol  (ROBAXIN ) 500 MG tablet [id number] Yes TAKE 1 TABLET BY MOUTH FOUR TIMES DAILY *NEW PRESCRIPTION REQUEST*  Patient taking differently: Take 500 mg by mouth at bedtime.   Cook, Jayce G, DO  Active Spouse/Significant Other, Pharmacy Records  pantoprazole  (PROTONIX ) 40 MG tablet [id number] Yes Take 1 tablet (40 mg total) by mouth daily. Pearlean Manus, MD  Active   tamsulosin  (FLOMAX ) 0.4  MG CAPS capsule [id number] Yes Take 1 capsule (0.4 mg total) by mouth daily. Cook, Jayce G, DO  Active Spouse/Significant Other, Pharmacy Records  umeclidinium bromide  (INCRUSE ELLIPTA ) 62.5 MCG/ACT AEPB [id number] Yes Inhale 1 puff into the lungs daily.  Patient taking differently: Inhale 1 puff into the lungs daily as needed.   Pearlean Manus, MD  Active   Med List Note Lorne Been, CPhT 12/08/23 9071): Wife handles medications.             Home Care and Equipment/Supplies: Were Home Health Services Ordered?: No (Patient will resume PT with [facility]. Spouse has communicated with [facility]) Has Agency set up a time to come to your home?: No Any new equipment or medical supplies ordered?: No  Functional Questionnaire: Do you need assistance with bathing/showering or dressing?: Yes ([name] assists) Do you need assistance with meal preparation?: Yes ([name] assists) Do you need assistance with eating?: No Do you have difficulty maintaining continence: No Do you need assistance with getting out of bed/getting out of a chair/moving?: No (uses a walker for assistance) Do you have difficulty managing or taking your medications?: Yes ([name] assist)  Follow up appointments reviewed: PCP Follow-up appointment confirmed?: No (Patient was seen by PCP on 12/16/23. Prefers to attend specialist visits.) Specialist Hospital Follow-up appointment confirmed?: Yes Date of Specialist follow-up appointment?: 12/31/23 Follow-Up Specialty Provider:: Cardiology with Laymon Qua Do you need transportation to your follow-up appointment?: No Do you understand care options if your condition(s) worsen?: Yes-patient verbalized understanding  SDOH Interventions Today    Flowsheet Row Most Recent Value  SDOH Interventions   Food Insecurity Interventions Intervention Not Indicated  Housing Interventions Intervention Not Indicated  Transportation Interventions Intervention Not Indicated  Utilities  Interventions Intervention Not Indicated    Andrea Dimes RN, BSN [place]  [facility] RN Care Manager [phone]

## 2023-12-19 NOTE — Anesthesia Postprocedure Evaluation (Signed)
 Anesthesia Post Note  Patient: Darryl Ward  Procedure(s) Performed: CARDIOVERSION  Patient location during evaluation: Phase II Anesthesia Type: MAC Level of consciousness: awake Pain management: pain level controlled Vital Signs Assessment: post-procedure vital signs reviewed and stable Respiratory status: spontaneous breathing and respiratory function stable Cardiovascular status: blood pressure returned to baseline and stable Postop Assessment: no headache and no apparent nausea or vomiting Anesthetic complications: no Comments: Late entry   No notable events documented.   Last Vitals:  Vitals:   12/17/23 1500 12/17/23 1600  BP: 122/61   Pulse: 63 64  Resp: 14 17  Temp:    SpO2: 100% 100%    Last Pain:  Vitals:   12/17/23 1227  TempSrc: Oral  PainSc:                  Yvonna JINNY Bosworth

## 2023-12-22 ENCOUNTER — Encounter: Payer: Self-pay | Admitting: Family Medicine

## 2023-12-22 ENCOUNTER — Ambulatory Visit: Payer: Self-pay | Admitting: Family Medicine

## 2023-12-22 ENCOUNTER — Other Ambulatory Visit: Payer: Self-pay | Admitting: Family Medicine

## 2023-12-22 MED ORDER — ONDANSETRON 4 MG PO TBDP
4.0000 mg | ORAL_TABLET | Freq: Three times a day (TID) | ORAL | 3 refills | Status: DC | PRN
  Filled 2023-12-22: qty 20, 7d supply, fill #0

## 2023-12-22 NOTE — Telephone Encounter (Signed)
 FYI Only or Action Required?: Action required by provider: Requesting Zofran .  Patient was last seen in primary care on 12/16/2023 by Cook, Jayce G, DO.  Called Nurse Triage reporting Nausea.  Symptoms began yesterday.  Interventions attempted: Nothing.  Symptoms are: unchanged.  Triage Disposition: Home Care  Patient/caregiver understands and will follow disposition?: Yes Reason for Disposition  Unexplained nausea  Answer Assessment - Initial Assessment Questions Patient's wife Jerel calling today. States HR and BP are good. When in hospital they found an infection and was septic and just labeled it as a GI issue Going through cancer treatments, had a broken hip but unfure what's causing the nausea. Patient's wife is requesting Zofran  from PCP to [facility] pharmacy on file, for delivery. Please advise.   1. NAUSEA SEVERITY: How bad is the nausea? (e.g., mild, moderate, severe; dehydration, weight loss)     Not eating anything, keeping liquids down. Moderate to severe.  2. ONSET: When did the nausea begin?     2 am  3. VOMITING: Any vomiting? If Yes, ask: How many times today?     2am and 430 am  4. RECURRENT SYMPTOM: Have you had nausea before? If Yes, ask: When was the last time? What happened that time?     Yes, the last few weeks, was admitted and went into AFIB and wife is nervous that's going to happen again  5. CAUSE: What do you think is causing the nausea?     Unsure  Protocols used: [facility]  Copied from CRM [id number]. Topic: Clinical - Red Word Triage >> Dec 22, 2023  3:17 PM Olam RAMAN wrote: Red Word that prompted transfer to Nurse Triage: Pt is experiencing vomiting and nausea since his discharge from hosp 11/26 Wife Terry on the line pt is with caller

## 2023-12-23 ENCOUNTER — Inpatient Hospital Stay: Attending: Hematology

## 2023-12-23 ENCOUNTER — Other Ambulatory Visit (HOSPITAL_COMMUNITY): Payer: Self-pay

## 2023-12-23 ENCOUNTER — Other Ambulatory Visit: Payer: Self-pay

## 2023-12-23 ENCOUNTER — Other Ambulatory Visit: Payer: Self-pay | Admitting: Family Medicine

## 2023-12-23 ENCOUNTER — Other Ambulatory Visit

## 2023-12-23 ENCOUNTER — Encounter (HOSPITAL_COMMUNITY): Payer: Self-pay

## 2023-12-23 ENCOUNTER — Ambulatory Visit (HOSPITAL_COMMUNITY)
Admission: RE | Admit: 2023-12-23 | Discharge: 2023-12-23 | Disposition: A | Source: Ambulatory Visit | Attending: Oncology | Admitting: Oncology

## 2023-12-23 ENCOUNTER — Encounter: Payer: Self-pay | Admitting: Oncology

## 2023-12-23 DIAGNOSIS — I714 Abdominal aortic aneurysm, without rupture, unspecified: Secondary | ICD-10-CM | POA: Insufficient documentation

## 2023-12-23 DIAGNOSIS — Z9221 Personal history of antineoplastic chemotherapy: Secondary | ICD-10-CM | POA: Insufficient documentation

## 2023-12-23 DIAGNOSIS — D509 Iron deficiency anemia, unspecified: Secondary | ICD-10-CM | POA: Diagnosis not present

## 2023-12-23 DIAGNOSIS — I5042 Chronic combined systolic (congestive) and diastolic (congestive) heart failure: Secondary | ICD-10-CM

## 2023-12-23 DIAGNOSIS — R7989 Other specified abnormal findings of blood chemistry: Secondary | ICD-10-CM | POA: Diagnosis not present

## 2023-12-23 DIAGNOSIS — R197 Diarrhea, unspecified: Secondary | ICD-10-CM | POA: Insufficient documentation

## 2023-12-23 DIAGNOSIS — C3411 Malignant neoplasm of upper lobe, right bronchus or lung: Secondary | ICD-10-CM | POA: Diagnosis present

## 2023-12-23 DIAGNOSIS — J449 Chronic obstructive pulmonary disease, unspecified: Secondary | ICD-10-CM

## 2023-12-23 DIAGNOSIS — R112 Nausea with vomiting, unspecified: Secondary | ICD-10-CM | POA: Diagnosis not present

## 2023-12-23 DIAGNOSIS — I4891 Unspecified atrial fibrillation: Secondary | ICD-10-CM | POA: Diagnosis not present

## 2023-12-23 DIAGNOSIS — I2510 Atherosclerotic heart disease of native coronary artery without angina pectoris: Secondary | ICD-10-CM | POA: Diagnosis not present

## 2023-12-23 DIAGNOSIS — I700 Atherosclerosis of aorta: Secondary | ICD-10-CM | POA: Insufficient documentation

## 2023-12-23 DIAGNOSIS — D649 Anemia, unspecified: Secondary | ICD-10-CM | POA: Insufficient documentation

## 2023-12-23 DIAGNOSIS — K8020 Calculus of gallbladder without cholecystitis without obstruction: Secondary | ICD-10-CM | POA: Diagnosis not present

## 2023-12-23 DIAGNOSIS — D329 Benign neoplasm of meninges, unspecified: Secondary | ICD-10-CM | POA: Insufficient documentation

## 2023-12-23 LAB — MAGNESIUM: Magnesium: 2 mg/dL (ref 1.7–2.4)

## 2023-12-23 LAB — COMPREHENSIVE METABOLIC PANEL WITH GFR
ALT: 17 U/L (ref 0–44)
AST: 32 U/L (ref 15–41)
Albumin: 4 g/dL (ref 3.5–5.0)
Alkaline Phosphatase: 68 U/L (ref 38–126)
Anion gap: 14 (ref 5–15)
BUN: 14 mg/dL (ref 8–23)
CO2: 21 mmol/L — ABNORMAL LOW (ref 22–32)
Calcium: 9.5 mg/dL (ref 8.9–10.3)
Chloride: 103 mmol/L (ref 98–111)
Creatinine, Ser: 1.32 mg/dL — ABNORMAL HIGH (ref 0.61–1.24)
GFR, Estimated: 56 mL/min — ABNORMAL LOW (ref >60)
Glucose, Bld: 124 mg/dL — ABNORMAL HIGH (ref 70–99)
Potassium: 4 mmol/L (ref 3.5–5.1)
Sodium: 138 mmol/L (ref 135–145)
Total Bilirubin: 0.9 mg/dL (ref 0.0–1.2)
Total Protein: 6.6 g/dL (ref 6.5–8.1)

## 2023-12-23 LAB — CBC WITH DIFFERENTIAL/PLATELET
Abs Immature Granulocytes: 0.05 K/uL (ref 0.00–0.07)
Basophils Absolute: 0.1 K/uL (ref 0.0–0.1)
Basophils Relative: 1 %
Eosinophils Absolute: 0.1 K/uL (ref 0.0–0.5)
Eosinophils Relative: 1 %
HCT: 37.2 % — ABNORMAL LOW (ref 39.0–52.0)
Hemoglobin: 12.4 g/dL — ABNORMAL LOW (ref 13.0–17.0)
Immature Granulocytes: 1 %
Lymphocytes Relative: 8 %
Lymphs Abs: 0.9 K/uL (ref 0.7–4.0)
MCH: 31.8 pg (ref 26.0–34.0)
MCHC: 33.3 g/dL (ref 30.0–36.0)
MCV: 95.4 fL (ref 80.0–100.0)
Monocytes Absolute: 0.4 K/uL (ref 0.1–1.0)
Monocytes Relative: 4 %
Neutro Abs: 9.6 K/uL — ABNORMAL HIGH (ref 1.7–7.7)
Neutrophils Relative %: 85 %
Platelets: 184 K/uL (ref 150–400)
RBC: 3.9 MIL/uL — ABNORMAL LOW (ref 4.22–5.81)
RDW: 13.9 % (ref 11.5–15.5)
WBC: 11 K/uL — ABNORMAL HIGH (ref 4.0–10.5)
nRBC: 0 % (ref 0.0–0.2)

## 2023-12-23 LAB — IRON AND TIBC
Iron: 65 ug/dL (ref 45–182)
Saturation Ratios: 29 % (ref 17.9–39.5)
TIBC: 225 ug/dL — ABNORMAL LOW (ref 250–450)
UIBC: 161 ug/dL

## 2023-12-23 LAB — FERRITIN: Ferritin: 995 ng/mL — ABNORMAL HIGH (ref 24–336)

## 2023-12-23 MED ORDER — HEPARIN SOD (PORK) LOCK FLUSH 100 UNIT/ML IV SOLN
INTRAVENOUS | Status: AC
  Filled 2023-12-23: qty 5

## 2023-12-23 MED ORDER — HEPARIN SOD (PORK) LOCK FLUSH 100 UNIT/ML IV SOLN
500.0000 [IU] | Freq: Once | INTRAVENOUS | Status: AC
  Administered 2023-12-23: 500 [IU] via INTRAVENOUS

## 2023-12-23 NOTE — Progress Notes (Signed)
 Port flushed with good blood return noted. No bruising or swelling at site. Patient left accessed for CT scan. Patient discharged in satisfactory condition. VVS stable with no signs or symptoms of distressed noted.

## 2023-12-23 NOTE — Patient Instructions (Signed)
 [facility] [place] - A DEPT OF [facility]  Discharge Instructions: Thank you for choosing [facility] to provide your oncology and hematology care.  If you have a lab appointment with the Cancer Center - please note that after April 8th, 2024, all labs will be drawn in the cancer center.  You do not have to check in or register with the main entrance as you have in the past but will complete your check-in in the cancer center.  Wear comfortable clothing and clothing appropriate for easy access to any Portacath or PICC line.   We strive to give you quality time with your provider. You may need to reschedule your appointment if you arrive late (15 or more minutes).  Arriving late affects you and other patients whose appointments are after yours.  Also, if you miss three or more appointments without notifying the office, you may be dismissed from the clinic at the provider's discretion.      For prescription refill requests, have your pharmacy contact our office and allow 72 hours for refills to be completed.    Today you received the following port flush with lab return as scheduled.   To help prevent nausea and vomiting after your treatment, we encourage you to take your nausea medication as directed.  BELOW ARE SYMPTOMS THAT SHOULD BE REPORTED IMMEDIATELY: *FEVER GREATER THAN 100.4 F (38 C) OR HIGHER *CHILLS OR SWEATING *NAUSEA AND VOMITING THAT IS NOT CONTROLLED WITH YOUR NAUSEA MEDICATION *UNUSUAL SHORTNESS OF BREATH *UNUSUAL BRUISING OR BLEEDING *URINARY PROBLEMS (pain or burning when urinating, or frequent urination) *BOWEL PROBLEMS (unusual diarrhea, constipation, pain near the anus) TENDERNESS IN MOUTH AND THROAT WITH OR WITHOUT PRESENCE OF ULCERS (sore throat, sores in mouth, or a toothache) UNUSUAL RASH, SWELLING OR PAIN  UNUSUAL VAGINAL DISCHARGE OR ITCHING   Items with * indicate a potential emergency and should be followed up as soon as  possible or go to the Emergency Department if any problems should occur.  Please show the CHEMOTHERAPY ALERT CARD or IMMUNOTHERAPY ALERT CARD at check-in to the Emergency Department and triage nurse.  Should you have questions after your visit or need to cancel or reschedule your appointment, please contact [facility] [place] - A DEPT OF [facility] [phone]  and follow the prompts.  Office hours are 8:00 a.m. to 4:30 p.m. Monday - Friday. Please note that voicemails left after 4:00 p.m. may not be returned until the following business day.  We are closed weekends and major holidays. You have access to a nurse at all times for urgent questions. Please call the main number to the clinic [phone] and follow the prompts.  For any non-urgent questions, you may also contact your provider using MyChart. We now offer e-Visits for anyone 40 and older to request care online for non-urgent symptoms. For details visit [email or web address].   Also download the MyChart app! Go to the app store, search MyChart, open the app, select [facility], and log in with your MyChart username and password.

## 2023-12-24 ENCOUNTER — Other Ambulatory Visit

## 2023-12-24 ENCOUNTER — Other Ambulatory Visit (HOSPITAL_COMMUNITY)

## 2023-12-26 ENCOUNTER — Other Ambulatory Visit (HOSPITAL_COMMUNITY): Payer: Self-pay

## 2023-12-30 ENCOUNTER — Telehealth: Payer: Self-pay

## 2023-12-30 NOTE — Progress Notes (Signed)
 Complex Care Management Note Care Guide Note  12/30/2023 Name: Darryl Ward MRN: [id number] DOB: May 11, 1945   Complex Care Management Outreach Attempts: An unsuccessful telephone outreach was attempted today to offer the patient information about available complex care management services.  Follow Up Plan:  Additional outreach attempts will be made to offer the patient complex care management information and services.   Encounter Outcome:  No Answer  Jeoffrey Buffalo , RMA     [place]  [facility], [facility] Guide  Direct Dial: [phone]  Website: [email or web address]

## 2023-12-31 ENCOUNTER — Other Ambulatory Visit: Payer: Self-pay | Admitting: Emergency Medicine

## 2023-12-31 ENCOUNTER — Encounter: Payer: Self-pay | Admitting: Student

## 2023-12-31 ENCOUNTER — Other Ambulatory Visit (HOSPITAL_COMMUNITY): Payer: Self-pay

## 2023-12-31 ENCOUNTER — Ambulatory Visit: Admitting: Oncology

## 2023-12-31 ENCOUNTER — Ambulatory Visit: Attending: Student | Admitting: Student

## 2023-12-31 VITALS — BP 128/80 | HR 86 | Ht 70.0 in | Wt 172.6 lb

## 2023-12-31 DIAGNOSIS — I480 Paroxysmal atrial fibrillation: Secondary | ICD-10-CM

## 2023-12-31 DIAGNOSIS — I5020 Unspecified systolic (congestive) heart failure: Secondary | ICD-10-CM | POA: Diagnosis not present

## 2023-12-31 DIAGNOSIS — J449 Chronic obstructive pulmonary disease, unspecified: Secondary | ICD-10-CM

## 2023-12-31 DIAGNOSIS — E785 Hyperlipidemia, unspecified: Secondary | ICD-10-CM | POA: Diagnosis not present

## 2023-12-31 DIAGNOSIS — I25119 Atherosclerotic heart disease of native coronary artery with unspecified angina pectoris: Secondary | ICD-10-CM | POA: Diagnosis not present

## 2023-12-31 DIAGNOSIS — I714 Abdominal aortic aneurysm, without rupture, unspecified: Secondary | ICD-10-CM

## 2023-12-31 MED ORDER — ALBUTEROL SULFATE HFA 108 (90 BASE) MCG/ACT IN AERS: 2.0000 | INHALATION_SPRAY | Freq: Four times a day (QID) | RESPIRATORY_TRACT | 6 refills | Status: AC | PRN

## 2023-12-31 MED ORDER — AMIODARONE HCL 200 MG PO TABS
200.0000 mg | ORAL_TABLET | Freq: Every day | ORAL | 1 refills | Status: DC
  Filled 2023-12-31 – 2024-01-12 (×5): qty 40, 40d supply, fill #0

## 2023-12-31 NOTE — Patient Instructions (Signed)
 Medication Instructions:  Your physician recommends that you continue on your current medications as directed. Please refer to the Current Medication list given to you today.  *If you need a refill on your cardiac medications before your next appointment, please call your pharmacy*  Testing/Procedures: Your physician has requested that you have an echocardiogram. Echocardiography is a painless test that uses sound waves to create images of your heart. It provides your doctor with information about the size and shape of your heart and how well your hearts chambers and valves are working. This procedure takes approximately one hour. There are no restrictions for this procedure. Please do NOT wear cologne, perfume, aftershave, or lotions (deodorant is allowed). Please arrive 15 minutes prior to your appointment time.  Please note: We ask at that you not bring children with you during ultrasound (echo/ vascular) testing. Due to room size and safety concerns, children are not allowed in the ultrasound rooms during exams. Our front office staff cannot provide observation of children in our lobby area while testing is being conducted. An adult accompanying a patient to their appointment will only be allowed in the ultrasound room at the discretion of the ultrasound technician under special circumstances. We apologize for any inconvenience.   Follow-Up: At [facility], you and your health needs are our priority.  As part of our continuing mission to provide you with exceptional heart care, our providers are all part of one team.  This team includes your primary Cardiologist (physician) and Advanced Practice Providers or APPs (Physician Assistants and Nurse Practitioners) who all work together to provide you with the care you need, when you need it.  Your next appointment:   4-5 month(s)  Provider:   [name], MD or Laymon Qua, PA-C    We recommend signing up for the patient portal  called MyChart.  Sign up information is provided on this After Visit Summary.  MyChart is used to connect with patients for Virtual Visits (Telemedicine).  Patients are able to view lab/test results, encounter notes, upcoming appointments, etc.  Non-urgent messages can be sent to your provider as well.   To learn more about what you can do with MyChart, go to [email or web address].   Other Instructions Take your Amiodarone  200 mg take one tablet daily   Take your Lasix  20 mg take one tablet daily

## 2023-12-31 NOTE — Progress Notes (Signed)
 Cardiology Office Note    Date:  12/31/2023  ID:  Darryl Ward, DOB 12-03-1945, MRN [id number] Cardiologist: [name], MD Cardiology APP:  Johnson Laymon HERO, PA-C { :  History of Present Illness:    Darryl Ward is a 78 y.o. 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  He was recently admitted to [facility] in 11/2023 for recurrent atrial fibrillation with RVR. Had previously been admitted with the arrhythmia earlier that month and underwent DCCV with conversion back to normal sinus rhythm. He did report worsening weakness and fatigue which started the day prior to admission. Given hypotension on admission, he was initially started on IV Amiodarone  but had persistent atrial fibrillation. Also, this was not ideal long-term given his history of lung cancer. DCCV was arranged and this was performed by Dr. Sierras on 12/17/2023 and he converted back to normal sinus rhythm with a single synchronized 150 J shock. Was transitioned to PO Amiodarone  200 mg twice daily for 5 days then reduce to 200 mg daily.  He was not restarted on Toprol -XL given bradycardia and was continued on Eliquis  for anticoagulation.  In talking with the patient and one of his caregivers today, he reports overall feeling well since his hospitalization. He denies any recurrent weakness or fatigue resembling what he felt when in atrial fibrillation. No recent chest pain, palpitations, dyspnea on exertion, orthopnea, PND or pitting edema. He reports good compliance with his medications and another family member is helping with these. He has remained  on Amiodarone  200 mg twice daily and says he is taking Lasix  twice daily as well.  Studies Reviewed:   EKG: EKG is ordered today and demonstrates:   EKG Interpretation Date/Time:  Wednesday December 31 2023 14:14:36 EST Ventricular Rate:  86 PR Interval:  178 QRS Duration:  92 QT Interval:  390 QTC Calculation: 466 R Axis:   61  Text Interpretation: Normal sinus rhythm with sinus arrhythmia Incomplete right bundle branch block Nonspecific T wave abnormality Prolonged QT Confirmed by Johnson Laymon ([id number]) on 12/31/2023 2:37:19 PM       Echocardiogram: 11/2023 IMPRESSIONS     1. Left ventricular ejection fraction, by estimation, is 40 to 45%. The  left ventricle has mildly decreased function. The left ventricle  demonstrates global hypokinesis. Left ventricular diastolic parameters are  indeterminate.   2. Right ventricular systolic function is moderately reduced. The right  ventricular size is normal.   3. Left atrial size was moderately dilated.   4. Right atrial size was mildly dilated.   5. The mitral valve is normal in structure. Trivial mitral valve  regurgitation. No evidence of mitral stenosis.   6. The aortic valve is tricuspid. There is mild calcification of the  aortic valve. Aortic valve regurgitation is not visualized. No aortic  stenosis is present.    Risk Assessment/Calculations:   CHA2DS2-VASc Score = 7  This indicates a 11.2% annual risk of stroke. The patient's score is based upon: CHF History: 1 HTN History: 1 Diabetes History: 0 Stroke History: 2 Vascular Disease History: 1 Age Score: 2 Gender Score: 0   Physical Exam:  VS:  BP 128/80   Pulse 86   Ht 5' 10 (1.778 m)   Wt 172 lb 9.6 oz (78.3 kg)   SpO2 95%   BMI 24.77 kg/m    Wt Readings from Last 3 Encounters:  12/31/23 172 lb 9.6 oz (78.3 kg)  12/17/23 175 lb 11.3 oz (79.7 kg)  12/16/23 179 lb (81.2 kg)     GEN: Well nourished, well developed male appearing in no acute  distress NECK: No JVD; No carotid bruits CARDIAC: RRR, no murmurs, rubs, gallops RESPIRATORY:  Clear to auscultation without rales, wheezing or rhonchi  ABDOMEN: Appears non-distended. No obvious abdominal masses. EXTREMITIES: No clubbing or cyanosis. No pitting edema.  Distal pedal pulses are 2+ bilaterally.   Assessment and Plan:   1. Paroxysmal atrial fibrillation (HCC) - He underwent DCCV x2 in 11/2023 and was ultimately started on Amiodarone . At this time, he has continued on Amiodarone  200 mg twice daily and will reduce to 200 mg daily. LFT's and TSH were WNL during his recent admission. Will recheck in 6 months unless checked by his PCP in the interim. - Continue Eliquis  5 mg twice daily for anticoagulation which is the appropriate dose given his current age (78 yo), weight (172 lbs) and renal function (creatinine at 1.32 by recent labs).  CBC on 12/23/2023 showed his hemoglobin had improved to 12.4 and platelets were stable at 184 K.   2. HFmrEF - Echocardiogram in 11/2023 showed his EF was mildly reduced at 40 to 45% and it was unclear if his cardiomyopathy was due to his tachycardia at the time or an ischemic etiology. Will obtain a follow-up limited echocardiogram in 2 to 3 months for reassessment of his EF.  If this remains reduced, would plan for initiation of GDMT. - Given no issues with fluid retention, we reviewed that he could also reduce Lasix  to 20 mg once daily (listed as taking once daily but currently taking twice daily)  3. Coronary artery disease involving native coronary artery of native heart with angina pectoris - He previously underwent PTCA of D1 in 2007 with low-risk NST in 2013. He denies any recent anginal symptoms. He is not on ASA given the need for anticoagulation. Continue Atorvastatin  80 mg daily.  4. Hyperlipidemia LDL goal <70 - LDL was at 37 when checked earlier this year. Continue current medical therapy with Atorvastatin  80mg  daily.   5. Abdominal  aortic aneurysm (AAA) without rupture, unspecified part - He previously underwent bilateral aortofemoral bypass in 2013 and CT in 11/2023 showed dilatation of his infrarenal abdominal aorta measuring up to 4.4 cm. He is followed by Dr. Sheree with Vascular Surgery.    Signed, Laymon CHRISTELLA Qua, PA-C

## 2024-01-01 ENCOUNTER — Other Ambulatory Visit: Payer: Self-pay

## 2024-01-01 ENCOUNTER — Ambulatory Visit: Admitting: Student

## 2024-01-02 ENCOUNTER — Inpatient Hospital Stay: Admitting: Oncology

## 2024-01-02 ENCOUNTER — Encounter (HOSPITAL_COMMUNITY): Payer: Self-pay

## 2024-01-02 ENCOUNTER — Encounter: Payer: Self-pay | Admitting: Oncology

## 2024-01-02 ENCOUNTER — Telehealth (HOSPITAL_COMMUNITY): Payer: Self-pay

## 2024-01-02 ENCOUNTER — Other Ambulatory Visit (HOSPITAL_COMMUNITY): Payer: Self-pay

## 2024-01-02 ENCOUNTER — Other Ambulatory Visit: Payer: Self-pay

## 2024-01-02 VITALS — BP 101/55 | HR 77 | Temp 98.3°F | Resp 19

## 2024-01-02 DIAGNOSIS — C3411 Malignant neoplasm of upper lobe, right bronchus or lung: Secondary | ICD-10-CM | POA: Diagnosis not present

## 2024-01-02 DIAGNOSIS — I714 Abdominal aortic aneurysm, without rupture, unspecified: Secondary | ICD-10-CM | POA: Diagnosis not present

## 2024-01-02 DIAGNOSIS — R051 Acute cough: Secondary | ICD-10-CM | POA: Diagnosis not present

## 2024-01-02 DIAGNOSIS — D649 Anemia, unspecified: Secondary | ICD-10-CM | POA: Diagnosis not present

## 2024-01-02 DIAGNOSIS — R112 Nausea with vomiting, unspecified: Secondary | ICD-10-CM | POA: Diagnosis not present

## 2024-01-02 DIAGNOSIS — R059 Cough, unspecified: Secondary | ICD-10-CM | POA: Insufficient documentation

## 2024-01-02 MED ORDER — AZITHROMYCIN 250 MG PO TABS
ORAL_TABLET | ORAL | 0 refills | Status: DC
  Filled 2024-01-02: qty 6, 5d supply, fill #0

## 2024-01-02 MED ORDER — PREDNISONE 20 MG PO TABS
20.0000 mg | ORAL_TABLET | Freq: Every day | ORAL | 0 refills | Status: DC
  Filled 2024-01-02: qty 5, 5d supply, fill #0

## 2024-01-02 MED ORDER — ONDANSETRON 4 MG PO TBDP
4.0000 mg | ORAL_TABLET | Freq: Three times a day (TID) | ORAL | 3 refills | Status: AC | PRN
  Filled 2024-01-02: qty 90, 30d supply, fill #0

## 2024-01-02 NOTE — Assessment & Plan Note (Addendum)
 He is currently at a rehab facility undergoing physical therapy.  He is able to walk with the help of walker. Reviewed labs from 12/23/2023: Normal LFTs but creatinine is elevated at 1.32.  GFR is 56. CT chest without contrast on 12/23/2023 showed no evidence of local recurrence or metastatic disease status post right upper lobectomy.  New multifocal patchy ground glass opacities in the right lower lobe and to a lesser degree the left lower lobe likely inflammatory/infectious.  Recommend attention on follow-up. Recommend follow-up in 3 months with repeat CT chest and labs.

## 2024-01-02 NOTE — Assessment & Plan Note (Addendum)
 Reports previous repair.  Recent ct scan shows AAA measuring 4.1 cm.  He would like to be referred back to vascular- Dr. Harvey for evaluation.

## 2024-01-02 NOTE — Assessment & Plan Note (Addendum)
 Patient wife reports that he was being worked up for possible sepsis at his last admission but was never given antibiotics. She is unsure if they ever discovered where the infection was. On most recent CT scan there is new multifocal patchy ground glass opacities in right and left lower lobe reflecting either inflammation or infection. Patient reports he has been coughing here quite a bit more than normal. Would recommend going ahead and treating with antibiotics and low-dose steroids. We discussed sending prescription for azithromycin 2 to 50 mg tablets.  Take 2 tabs on day 1 followed by 1 tablet until complete along with 20 mg prednisone  each morning with breakfast. Repeat chest x-ray in 4 to 6 weeks to see if improvement is noted.

## 2024-01-02 NOTE — Progress Notes (Signed)
 [facility] OFFICE PROGRESS NOTE  Cook, Jayce G, DO  ASSESSMENT & PLAN:  Assessment & Plan Normocytic anemia Etiology likely multifactorial including iron deficiency and CKD. He received 2 doses of IV Feraheme on 09/23/2023 and 09/30/2023 with good tolerance. Labs from 12/23/2023 show ferritin of 995 and iron saturation 29% within normal TIBC.  Hemoglobin is 12.4 which is a significant improvement from previous. No additional IV iron needed at this time. We discussed that his ferritin level is elevated likely due to acute phase reactant and recent hospitalization and possible infection.  Iron saturations are 29% so elevated ferritin is unlikely.   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  Recent ct scan shows AAA measuring 4.1 cm.  He would like to be referred back to vascular- Dr. Harvey for evaluation.  Primary adenocarcinoma of upper lobe of right lung [facility])  He is currently at a rehab facility undergoing physical therapy.  He is able to walk with the help of walker. Reviewed labs from 12/23/2023: Normal LFTs but creatinine is elevated at 1.32.  GFR is 56. CT chest without contrast on 12/23/2023 showed no evidence of local recurrence or metastatic disease status post right upper lobectomy.  New multifocal patchy ground glass opacities in the right lower lobe and to a lesser degree the left lower lobe likely inflammatory/infectious.  Recommend attention on follow-up. Recommend follow-up in 3 months with repeat CT chest and labs.  Nausea and vomiting, unspecified vomiting type Patient has had nausea and vomiting fairly consistently for about 6 weeks now. He has been evaluated in the ED on several occasions after severe dehydration causing atrial fibrillation with RVR. Wife is unsure of what is causing his nausea/vomiting.  She is asking for a refill of Zofran  that was given to them by the emergency room. New Rx sent for Zofran  4 mg every 6-8 hours as needed for  nausea. We discussed sending a GI referral given it is not improving. Recommend he continue to drink fluids including Gatorade and Pedialyte. Acute cough Patient wife reports that he was being worked up for possible sepsis at his last admission but was never given antibiotics. She is unsure if they ever discovered where the infection was. On most recent CT scan there is new multifocal patchy ground glass opacities in right and left lower lobe reflecting either inflammation or infection. Patient reports he has been coughing here quite a bit more than normal. Would recommend going ahead and treating with antibiotics and low-dose steroids. We discussed sending prescription for azithromycin 2 to 50 mg tablets.  Take 2 tabs on day 1 followed by 1 tablet until complete along with 20 mg prednisone  each morning with breakfast. Repeat chest x-ray in 4 to 6 weeks to see if improvement is noted.  Orders Placed This Encounter  Procedures   CT CHEST WO CONTRAST    Standing Status:   Future    Expected Date:   04/03/2024    Expiration Date:   01/01/2025    Preferred imaging location?:   [facility]   DG Chest 2 View    Standing Status:   Future    Expected Date:   02/02/2024    Expiration Date:   01/01/2025    Reason for Exam (SYMPTOM  OR DIAGNOSIS REQUIRED):   Follow-up after recent CT scan and antibiotics.    Preferred imaging location?:   [facility]   Ambulatory referral to Gastroenterology    Referral Priority:   Routine  Referral Type:   Consultation    Referral Reason:   Specialty Services Required    Number of Visits Requested:   1    INTERVAL HISTORY: Patient returns for follow-up with his wife.   Patient was recently hospitalized for atrial fibrillation with RVR.  Reports symptoms started after he ate Mexican causing him to have significant nausea vomiting and diarrhea and subsequent dehydration.  He was placed on a Cardizem  drip.  Had successful cardioversion on  12/11/2023 and remained in normal sinus rhythm the rest of his hospitalization.  Patient became hypotensive with orthostasis and his BP meds were manipulated.  He recently had a CT scan on 12/23/2023 which showed no evidence of local recurrence or metastatic disease status post right upper lobectomy.  New multifocal patchy ground glass opacities in the right lower lobe and to a lesser degree in the left lower lobe likely inflammatory/infectious.  Recommend attention to follow-up.  Cholelithiasis, coronary and aortic arthrosclerosis.  Patient's wife reports that he has not done much better since his recent hospitalizations.  He remains nauseated and he has vomiting several times per day.  He was prescribed Zofran  in the emergency room and she is asking if they can get a refill of this.  Reports it seems to be the only thing that keeps fluids down otherwise he projectile vomits anything that hits his stomach.  He has intermittent diarrhea as well.  Reports his energy levels are very low and he is very weak.  He has a cough that has worsened over the past couple of weeks.  Appetite is also low.  Denies any recent fevers.  He was never given antibiotics from the emergency room although he was being worked up for sepsis.  We reviewed CBC, CMP, iron panel, ferritin magnesium  level and CT chest.  SUMMARY OF HEMATOLOGIC HISTORY: Oncology History  Primary adenocarcinoma of upper lobe of right lung (HCC)  06/26/2021 Initial Diagnosis   Primary adenocarcinoma of upper lobe of right lung (HCC)   08/06/2021 - 08/27/2021 Chemotherapy   Patient is on Treatment Plan : LUNG NSCLC Pemetrexed  + Carboplatin  q21d x 4 Cycles     08/06/2021 - 10/02/2021 Chemotherapy   Patient is on Treatment Plan : LUNG NSCLC Pemetrexed  + Carboplatin  q21d x 4 Cycles     03/11/2022 - 02/13/2023 Chemotherapy   Patient is on Treatment Plan : LUNG NSCLC Pembrolizumab  (200) q21d       1. Stage II (T1 N1 M0) right upper lobe adenocarcinoma: -  CT chest on 06/08/2021: 1.2 x 1.1 cm subsolid subpleural nodule of the peripheral posterior right upper lobe.  Occasional additional small pulmonary nodules measuring 0.4 cm and smaller. - Bronchoscopy (06/11/2021): RUL nodule brushing and FNA: Malignant cells with features of adenocarcinoma. - PET scan (06/22/2021): Subsolid nodular lesion in the right upper lobe, hypermetabolic SUV 15.  Small right hilar and infrahilar lymph nodes with SUV 4.03 concerning for metastatic adenopathy.  Small hypermetabolic left parotid gland lesion, consistent with previous history of Wharton's tumor.  Hypermetabolic focus in the transverse colon SUV 8.69. - MRI of the brain (07/11/2021): No evidence of metastatic disease.  Right sphenoid wing meningioma stable since 2022. - He was evaluated by Dr. Kerrin and was recommended to undergo neoadjuvant chemoimmunotherapy followed by surgical resection. - 3 cycles of carboplatin , pemetrexed  from 08/06/2021 through 10/02/2021 (opdivo  given with only cycle 2, discontinued for cycle 3 due to cardiac problems) - NGS testing not performed due to insufficient sample. - [id number]: Negative for EGFR  and ALK.  MSI high not detected. 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  Negative for visceral pleural involvement, LVI.  PD-L1 TPS is 1%. - Adjuvant pembrolizumab  started on 03/11/2022.  Pembrolizumab  held after 3 cycles due to fall and right fracture, s/p right hemiarthroplasty.    CBC    Component Value Date/Time   WBC 11.0 (H) 12/23/2023 1302   RBC 3.90 (L) 12/23/2023 1302   HGB 12.4 (L) 12/23/2023 1302   HGB 10.1 (L) 06/12/2022 1620   HCT 37.2 (L) 12/23/2023 1302   HCT 29.8 (L) 06/12/2022 1620   PLT 184 12/23/2023 1302   PLT 135 (L) 06/12/2022 1620   MCV 95.4 12/23/2023 1302   MCV 92  06/12/2022 1620   MCH 31.8 12/23/2023 1302   MCHC 33.3 12/23/2023 1302   RDW 13.9 12/23/2023 1302   RDW 13.7 06/12/2022 1620   LYMPHSABS 0.9 12/23/2023 1302   LYMPHSABS 2.1 07/13/2020 1006   MONOABS 0.4 12/23/2023 1302   EOSABS 0.1 12/23/2023 1302   EOSABS 0.1 07/13/2020 1006   BASOSABS 0.1 12/23/2023 1302   BASOSABS 0.1 07/13/2020 1006       Latest Ref Rng & Units 12/23/2023    1:02 PM 12/17/2023    5:07 AM 12/16/2023    4:14 PM  CMP  Glucose 70 - 99 mg/dL 875  88  888   BUN 8 - 23 mg/dL 14  14  16    Creatinine 0.61 - 1.24 mg/dL 8.67  8.81  8.71   Sodium 135 - 145 mmol/L 138  139  137   Potassium 3.5 - 5.1 mmol/L 4.0  3.9  4.0   Chloride 98 - 111 mmol/L 103  106  104   CO2 22 - 32 mmol/L 21  24  24    Calcium  8.9 - 10.3 mg/dL 9.5  8.5  9.1   Total Protein 6.5 - 8.1 g/dL 6.6   6.5   Total Bilirubin 0.0 - 1.2 mg/dL 0.9   0.6   Alkaline Phos 38 - 126 U/L 68   67   AST 15 - 41 U/L 32   24   ALT 0 - 44 U/L 17   12      Lab Results  Component Value Date   FERRITIN 995 (H) 12/23/2023   VITAMINB12 232 03/11/2023    Vitals:   01/02/24 1254  BP: (!) 101/55  Pulse: 77  Resp: 19  Temp: 98.3 F (36.8 C)  SpO2: 99%     Review of System:  Review of Systems  Constitutional:  Positive for malaise/fatigue.  Respiratory:  Positive for cough.   Gastrointestinal:  Positive for diarrhea, nausea and vomiting.  Genitourinary:  Positive for frequency and urgency.  Neurological:  Positive for weakness.  Psychiatric/Behavioral:  Negative for depression.     Physical Exam: Physical Exam Constitutional:      Appearance: Normal appearance.  HENT:     Head: Normocephalic and atraumatic.  Eyes:     Pupils: Pupils are equal, round, and reactive to light.  Cardiovascular:     Rate and Rhythm: Normal rate and regular rhythm.     Heart sounds: Normal heart sounds. No murmur heard. Pulmonary:     Effort: Pulmonary effort is normal.     Breath sounds: Normal breath sounds. No  wheezing.  Abdominal:  General: Bowel sounds are normal. There is no distension.     Palpations: Abdomen is soft.     Tenderness: There is no abdominal tenderness.  Musculoskeletal:        General: Normal range of motion.     Cervical back: Normal range of motion.  Skin:    General: Skin is warm and dry.     Findings: No rash.  Neurological:     Mental Status: He is alert and oriented to person, place, and time.     Gait: Gait is intact.  Psychiatric:        Mood and Affect: Mood and affect normal.        Cognition and Memory: Memory normal.        Judgment: Judgment normal.      I spent 25 minutes dedicated to the care of this patient (face-to-face and non-face-to-face) on the date of the encounter to include what is described in the assessment and plan.,  Delon Hope, NP 01/02/2024 1:36 PM

## 2024-01-02 NOTE — Assessment & Plan Note (Addendum)
 Etiology likely multifactorial including iron deficiency and CKD. He received 2 doses of IV Feraheme on 09/23/2023 and 09/30/2023 with good tolerance. Labs from 12/23/2023 show ferritin of 995 and iron saturation 29% within normal TIBC.  Hemoglobin is 12.4 which is a significant improvement from previous. No additional IV iron needed at this time. We discussed that his ferritin level is elevated likely due to acute phase reactant and recent hospitalization and possible infection.  Iron saturations are 29% so elevated ferritin is unlikely.

## 2024-01-03 ENCOUNTER — Other Ambulatory Visit (HOSPITAL_COMMUNITY): Payer: Self-pay

## 2024-01-05 ENCOUNTER — Encounter (HOSPITAL_COMMUNITY): Payer: Self-pay | Admitting: Pharmacist

## 2024-01-05 ENCOUNTER — Ambulatory Visit: Admitting: Adult Health

## 2024-01-05 ENCOUNTER — Other Ambulatory Visit (HOSPITAL_COMMUNITY): Payer: Self-pay

## 2024-01-06 ENCOUNTER — Other Ambulatory Visit (HOSPITAL_COMMUNITY): Payer: Self-pay

## 2024-01-07 ENCOUNTER — Other Ambulatory Visit (HOSPITAL_COMMUNITY): Payer: Self-pay

## 2024-01-09 ENCOUNTER — Encounter: Payer: Self-pay | Admitting: Internal Medicine

## 2024-01-09 ENCOUNTER — Other Ambulatory Visit (HOSPITAL_COMMUNITY): Payer: Self-pay

## 2024-01-11 ENCOUNTER — Encounter: Payer: Self-pay | Admitting: Oncology

## 2024-01-12 ENCOUNTER — Other Ambulatory Visit (HOSPITAL_COMMUNITY): Payer: Self-pay

## 2024-01-12 ENCOUNTER — Telehealth: Payer: Self-pay | Admitting: Family Medicine

## 2024-01-12 NOTE — Telephone Encounter (Signed)
 Refill on    tamsulosin  (FLOMAX ) 0.4 MG CAPS capsule   [facility]

## 2024-01-13 ENCOUNTER — Other Ambulatory Visit: Payer: Self-pay | Admitting: Family Medicine

## 2024-01-13 MED ORDER — TAMSULOSIN HCL 0.4 MG PO CAPS: 0.4000 mg | ORAL_CAPSULE | Freq: Every day | ORAL | 3 refills | Status: AC

## 2024-01-14 ENCOUNTER — Encounter: Payer: Self-pay | Admitting: Family Medicine

## 2024-01-18 ENCOUNTER — Other Ambulatory Visit: Payer: Self-pay | Admitting: Family Medicine

## 2024-01-18 ENCOUNTER — Other Ambulatory Visit (HOSPITAL_COMMUNITY): Payer: Self-pay

## 2024-01-18 DIAGNOSIS — C3411 Malignant neoplasm of upper lobe, right bronchus or lung: Secondary | ICD-10-CM

## 2024-01-18 DIAGNOSIS — R296 Repeated falls: Secondary | ICD-10-CM

## 2024-01-18 DIAGNOSIS — Z8673 Personal history of transient ischemic attack (TIA), and cerebral infarction without residual deficits: Secondary | ICD-10-CM

## 2024-01-19 ENCOUNTER — Encounter: Payer: Self-pay | Admitting: *Deleted

## 2024-01-20 ENCOUNTER — Other Ambulatory Visit: Payer: Self-pay | Admitting: Family Medicine

## 2024-01-20 ENCOUNTER — Other Ambulatory Visit

## 2024-01-20 DIAGNOSIS — I5042 Chronic combined systolic (congestive) and diastolic (congestive) heart failure: Secondary | ICD-10-CM

## 2024-01-20 DIAGNOSIS — C3411 Malignant neoplasm of upper lobe, right bronchus or lung: Secondary | ICD-10-CM

## 2024-01-20 NOTE — Patient Instructions (Signed)
 Visit Information  Thank you for taking time to visit with me today. Please don't hesitate to contact me if I can be of assistance to you before our next scheduled appointment.  Our next appointment is by telephone on 01/27/24 at 2pm Please call the care guide team at [phone] if you need to cancel or reschedule your appointment.   Following is a copy of your care plan:   Goals Addressed             This Visit's Progress    BSW Goals       Current SDOH Barriers:  Level of care concerns  Interventions: Patient interviewed and appropriate screenings performed Provided patient with information about Respite Discussed plans with patient for ongoing follow up and provided patient with direct contact number Collaborated with primary care provider re: Hospice/Palliative Care* Assisted patient/caregiver with obtaining information about health plan benefits SW t/c [employer] (as pt Medicaid ends tomorrow) and Respite/Home Health is not provided under patients plan.  SW will communicate with provider to determine if patient is at Hospice/Palliative Care level to be eligible for [facility]. POA [name] assist with visit and reports providers have had conversations about level of care needs may be Hospice by the end of 2025.          Please call 911 if you are experiencing a Mental Health or Behavioral Health Crisis or need someone to talk to.  Health Care Power of Attorney [name] verbalized understanding of Care plan and visit instructions communicated this visit  Tillman Gardener, BSW [place]  [facility], [facility] Social Worker Direct Dial: [phone]  Fax: [phone] Website: [email or web address]

## 2024-01-20 NOTE — Patient Outreach (Signed)
 Social Drivers of Health  Community Resource and Care Coordination Visit Note   01/20/2024  Name: Darryl Ward MRN: [id number] DOB:Sep 14, 1945  Situation: Referral received for [facility] needs assessment and assistance related to Level of Care-Respite. I obtained verbal consent from Patient POA.  Visit completed with Patient and Darryl Ward on the phone.   Background:   SDOH Interventions Today    Flowsheet Row Most Recent Value  SDOH Interventions   Food Insecurity Interventions Intervention Not Indicated  [Receives $23 Foodstamps but uses benefits for food]  Housing Interventions Intervention Not Indicated  [Living with ex-wfie and not at risk of housing issues]  Transportation Interventions Intervention Not Indicated  [Ex-wife provides transportation]  Utilities Interventions Intervention Not Indicated  Financial Strain Interventions Intervention Not Indicated  [Share bills with ex-wife and pays toward bills]     Assessment:   Goals Addressed             This Visit's Progress    BSW Goals       Current SDOH Barriers:  Level of care concerns  Interventions: Patient interviewed and appropriate screenings performed Provided patient with information about Respite Discussed plans with patient for ongoing follow up and provided patient with direct contact number Collaborated with primary care provider re: Hospice/Palliative Care* Assisted patient/caregiver with obtaining information about health plan benefits SW t/c [employer] (as pt Medicaid ends tomorrow) and Respite/Home Health is not provided under patients plan.  SW will communicate with provider to determine if patient is at Hospice/Palliative Care level to be eligible for [facility]. POA Darryl Ward assist with visit and reports providers have had conversations about level of care needs may be Hospice by the end of 2025.          Recommendation:   Work with provider for orders for [facility].  Follow Up Plan:    Telephone follow up appointment date/time:  01/27/24 at 2pm  Tillman Gardener, BSW [place]  [facility], [facility] Social Worker Direct Dial: [phone]  Fax: [phone] Website: [email or web address]

## 2024-01-23 ENCOUNTER — Telehealth: Payer: Self-pay | Admitting: *Deleted

## 2024-01-23 NOTE — Telephone Encounter (Signed)
 Copied from CRM [id number]. Topic: Clinical - Home Health Verbal Orders >> Jan 23, 2024  2:57 PM Gattis SQUIBB wrote: Caller/Agency: [place] Social Worker with [facility] Number: Fax is [phone] and contact [phone]  Service Requested: Hospice order for patient for home care

## 2024-01-26 DIAGNOSIS — C3411 Malignant neoplasm of upper lobe, right bronchus or lung: Secondary | ICD-10-CM

## 2024-01-26 NOTE — Telephone Encounter (Signed)
 Copied from CRM #[id number]. Topic: Clinical - Home Health Verbal Orders >> Jan 26, 2024 10:37 AM Shanda MATSU wrote: Caller/Agency: [facility]  Callback Number: [phone] Service Requested: Hospice Frequency: N/A Any new concerns about the patient? No

## 2024-01-26 NOTE — Telephone Encounter (Signed)
 Cook, Jayce G, DO     01/25/24  6:17 PM Already put in referral to palliative as previously requested

## 2024-01-27 ENCOUNTER — Encounter: Payer: Self-pay | Admitting: Oncology

## 2024-01-27 ENCOUNTER — Other Ambulatory Visit: Payer: Self-pay

## 2024-01-27 NOTE — Patient Outreach (Signed)
 Social Drivers of Health  Community Resource and Care Coordination Visit Note   01/27/2024  Name: Darryl Ward MRN: [id number] DOB:Oct 06, 1945  Situation: Referral received for [facility] needs assessment and assistance related to Level of care. I obtained verbal consent from POA.  Visit completed with [name] on the phone.   Background:      Assessment:   Goals Addressed             This Visit's Progress    COMPLETED: BSW Goals       Current SDOH Barriers:  Level of care concerns  Interventions: Patient interviewed and appropriate screenings performed POA [name] has connected with [facility] and patient was approved for Hospice.  The Hospice nurse is scheduled to visit today.  Ms. Sharps is very pleased with the outcome.   No additional unmet needs reported at this time.  Goal completed.          Recommendation:   Patient will work with [facility].  Follow Up Plan:   Patient has achieved all patient stated goals. [employer] will be closed. Patient has been provided contact information should new needs arise.   Tillman Gardener, BSW [place]  [facility], [facility] Social Worker Direct Dial: [phone]  Fax: [phone] Website: [email or web address]

## 2024-01-27 NOTE — Telephone Encounter (Signed)
 Cook, Jayce G, DO      01/26/24  1:13 PM Referral to hospice placed.   Jacqulyn Ahle DO [facility]

## 2024-01-27 NOTE — Patient Instructions (Signed)

## 2024-01-29 ENCOUNTER — Ambulatory Visit (INDEPENDENT_AMBULATORY_CARE_PROVIDER_SITE_OTHER): Admitting: Internal Medicine

## 2024-01-29 ENCOUNTER — Other Ambulatory Visit: Payer: Self-pay | Admitting: *Deleted

## 2024-01-29 ENCOUNTER — Encounter: Payer: Self-pay | Admitting: Internal Medicine

## 2024-01-29 VITALS — BP 107/61 | HR 60 | Temp 97.3°F | Ht 70.0 in | Wt 169.0 lb

## 2024-01-29 DIAGNOSIS — R112 Nausea with vomiting, unspecified: Secondary | ICD-10-CM

## 2024-01-29 DIAGNOSIS — K219 Gastro-esophageal reflux disease without esophagitis: Secondary | ICD-10-CM | POA: Diagnosis not present

## 2024-01-29 DIAGNOSIS — K5904 Chronic idiopathic constipation: Secondary | ICD-10-CM

## 2024-01-29 DIAGNOSIS — K2100 Gastro-esophageal reflux disease with esophagitis, without bleeding: Secondary | ICD-10-CM

## 2024-01-29 DIAGNOSIS — K5909 Other constipation: Secondary | ICD-10-CM

## 2024-01-29 MED ORDER — LINACLOTIDE 145 MCG PO CAPS: 145.0000 ug | ORAL_CAPSULE | Freq: Every day | ORAL | Status: AC

## 2024-01-29 NOTE — Progress Notes (Signed)
 "   Primary Care Physician:  Cook, Jayce G, DO Primary Gastroenterologist:  Dr. Cindie  Chief Complaint  Patient presents with   New Patient (Initial Visit)    Patient here today as a new patient due to having several recent Ed visit to hospital admissions once being due to gastroenteritis. Patient is having some issues with nausea, vomiting, constipation alternating with diarrhea, abdominal pain. Patient denies any fevers or dark or bloody stools. Patient is currently on hospice care due to heart issues.     HPI:   Darryl Ward is a 79 y.o. male who presents to the clinic today for follow up visit.  Last seen in our office August 2024.  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  Patient recently enrolled in hospice care.  Had multiple hospitalizations November 2025, first of which occurred due to severe dehydration in the setting of nausea, vomiting and diarrhea which led to uncontrolled atrial fibrillation.  CT abdomen pelvis without contrast 12/07/2023 showed evidence of reflux esophagitis.  Currently taking pantoprazole  40 mg daily.  Main complaint for me today is his bowels.  States he will become very constipated after 4 to 5 days, he will then take numerous laxatives and have blowouts.  He will then repeat the process.  Taking 2 stool softeners daily without much improvement.  When he becomes constipated he does suffer from nausea and occasional vomiting.  No vomiting in 2 to 3 weeks however.  Taking ondansetron  as needed.   Past Medical History:  Diagnosis Date   AAA (abdominal aortic aneurysm)    Anemia    Arthritis    Blood transfusion without reported diagnosis  10/2021   Broken hip (HCC)    Cancer (HCC)    Carotid artery disease    Nonobstructive   Cataract    CHF (congestive heart failure) (HCC)    COPD (chronic obstructive pulmonary disease) (HCC)    Coronary atherosclerosis of native coronary artery    PTCA small diagonal 2007 otherwise nonobstructive CAD   Depression    Dysrhythmia    Essential hypertension, benign    GERD (gastroesophageal reflux disease)    Hyperlipidemia    NSTEMI (non-ST elevated myocardial infarction) (HCC) 2007   Stroke [facility]) 2004   Stroke [facility]) 2025   TIA (transient ischemic attack) 2006    Past Surgical History:  Procedure Laterality Date   AORTA - BILATERAL FEMORAL ARTERY BYPASS GRAFT  01/08/2012   Procedure: AORTA BIFEMORAL BYPASS GRAFT;  Surgeon: Carlin FORBES Haddock, MD;  Location: [facility] OR;  Service: Vascular;  Laterality: Bilateral;  using 18x64mm x 40cm Hemashield Gold Vascular Graft with Endarterectomy, Thombectomy and  Reimplantation of Inferior Mesenteric Artery   BACK SURGERY  2021   BRONCHIAL BIOPSY  06/11/2021   Procedure: BRONCHIAL BIOPSIES;  Surgeon: Shelah Lamar RAMAN, MD;  Location: [facility] ENDOSCOPY;  Service: Pulmonary;;   BRONCHIAL BRUSHINGS  06/11/2021   Procedure: BRONCHIAL BRUSHINGS;  Surgeon: Shelah Lamar RAMAN, MD;  Location: [facility] ENDOSCOPY;  Service: Pulmonary;;   BRONCHIAL NEEDLE ASPIRATION BIOPSY  06/11/2021   Procedure: BRONCHIAL NEEDLE ASPIRATION BIOPSIES;  Surgeon: Shelah Lamar RAMAN, MD;  Location: [facility] ENDOSCOPY;  Service: Pulmonary;;   CARDIOVERSION  N/A 09/20/2021   Procedure: CARDIOVERSION;  Surgeon: Debera Jayson MATSU, MD;  Location: [facility] ORS;  Service: Cardiovascular;  Laterality: N/A;   CARDIOVERSION N/A 12/11/2023   Procedure: CARDIOVERSION;  Surgeon: Francyne Headland, MD;  Location: [facility] INVASIVE CV LAB;  Service: Cardiovascular;  Laterality: N/A;   CARDIOVERSION N/A 12/17/2023   Procedure: CARDIOVERSION;  Surgeon: Debera Jayson MATSU, MD;  Location: [facility] ORS;  Service: Cardiovascular;  Laterality:  N/A;   COLONOSCOPY N/A 04/14/2019   Procedure: COLONOSCOPY;  Surgeon: Shaaron Lamar HERO, MD;  Location: [facility] ENDO SUITE;  Service: Endoscopy;  Laterality: N/A;  9:30   COLONOSCOPY WITH PROPOFOL  N/A 07/25/2021   Procedure: COLONOSCOPY WITH PROPOFOL ;  Surgeon: Cindie Carlin POUR, DO;  Location: [facility] ENDO SUITE;  Service: Endoscopy;  Laterality: N/A;  1:00pm   EYE SURGERY     FIDUCIAL MARKER PLACEMENT  06/11/2021   Procedure: FIDUCIAL MARKER PLACEMENT;  Surgeon: Shelah Lamar RAMAN, MD;  Location: [facility] ENDOSCOPY;  Service: Pulmonary;;   FRACTURE SURGERY     HIP ARTHROPLASTY Right 04/28/2022   Procedure: ARTHROPLASTY BIPOLAR HIP (HEMIARTHROPLASTY);  Surgeon: Beverley Evalene BIRCH, MD;  Location: [facility] OR;  Service: Orthopedics;  Laterality: Right;   INTERCOSTAL NERVE BLOCK Right 11/12/2021   Procedure: INTERCOSTAL NERVE BLOCK;  Surgeon: Kerrin Elspeth BROCKS, MD;  Location: [facility] OR;  Service: Thoracic;  Laterality: Right;   IR IMAGING GUIDED PORT INSERTION  07/31/2021   JOINT REPLACEMENT     Left cataract surgery     LYMPH NODE DISSECTION Right 11/12/2021   Procedure: LYMPH NODE DISSECTION;  Surgeon: Kerrin Elspeth BROCKS, MD;  Location: [facility] OR;  Service: Thoracic;  Laterality: Right;   POLYPECTOMY  04/14/2019   Procedure: POLYPECTOMY;  Surgeon: Shaaron Lamar HERO, MD;  [facility] ENDO SUITE;  Service: Endoscopy;;   POLYPECTOMY  07/25/2021   Procedure: POLYPECTOMY;  Surgeon: Cindie Carlin POUR, DO;  Location: [facility] ENDO SUITE;  Service: Endoscopy;;   SPINE SURGERY     TEE WITHOUT CARDIOVERSION N/A 09/20/2021   Procedure: TRANSESOPHAGEAL ECHOCARDIOGRAM (TEE);  Surgeon: Debera Jayson MATSU, MD;  Location: [facility] ORS;  Service: Cardiovascular;  Laterality: N/A;   TOTAL HIP ARTHROPLASTY Right 05/15/2023   Procedure: RIGHT HIP HEMIARTHROPLASTY REVISION WITH OPEN REDUCTION INTERNAL FIXATION OF PERIPROSTHETIC FEMUR FRACTURE;  Surgeon: Beverley Evalene BIRCH, MD;  Location: [facility] ORS;  Service: Orthopedics;  Laterality: Right;    TRANSFORAMINAL LUMBAR INTERBODY FUSION (TLIF) WITH PEDICLE SCREW FIXATION 1 LEVEL N/A 04/27/2020   Procedure: Transforaminal Lumbar Interbody Fusion Lumbar Five-Sacral One;  Surgeon: Debby Dorn MATSU, MD;  Location: [facility] OR;  Service: Neurosurgery;  Laterality: N/A;   VIDEO BRONCHOSCOPY WITH INSERTION OF INTERBRONCHIAL VALVE (IBV) N/A 11/22/2021   Procedure: VIDEO BRONCHOSCOPY WITH INSERTION OF INTERBRONCHIAL VALVE (IBV);  Surgeon: Kerrin Elspeth BROCKS, MD;  Location: [facility] OR;  Service: Thoracic;  Laterality: N/A;   VIDEO BRONCHOSCOPY WITH INSERTION OF INTERBRONCHIAL VALVE (IBV) N/A 01/24/2022   Procedure: VIDEO BRONCHOSCOPY WITH REMOVAL OF INTERBRONCHIAL VALVE (IBV);  Surgeon: Kerrin Elspeth BROCKS, MD;  Location: [facility] OR;  Service: Thoracic;  Laterality: N/A;   VIDEO BRONCHOSCOPY WITH RADIAL ENDOBRONCHIAL ULTRASOUND  06/11/2021   Procedure: VIDEO BRONCHOSCOPY WITH RADIAL ENDOBRONCHIAL ULTRASOUND;  Surgeon: Shelah Lamar RAMAN, MD;  Location: [facility] ENDOSCOPY;  Service: Pulmonary;;    Current Outpatient Medications  Medication Sig Dispense Refill   acetaminophen  (TYLENOL ) 325 MG tablet Take 2 tablets (650 mg total) by mouth every 6 (six) hours as needed for mild pain (pain score 1-3) or fever (or Fever >/= 101).  albuterol  (VENTOLIN  HFA) 108 (90 Base) MCG/ACT inhaler Inhale 2 puffs into the lungs every 6 (six) hours as needed for wheezing or shortness of breath. 6.7 g 6   amiodarone  (PACERONE ) 200 MG tablet Take 1 tablet (200 mg total) by mouth daily. 40 tablet 1   apixaban  (ELIQUIS ) 5 MG TABS tablet Take 1 tablet (5 mg total) by mouth 2 (two) times daily. (Patient taking differently: Take 5 mg by mouth daily.) 180 tablet 3   chlorthalidone  (HYGROTON ) 25 MG tablet Take 12.5 mg by mouth every morning.     escitalopram  (LEXAPRO ) 10 MG tablet Take 1 tablet (10 mg total) by mouth daily. 90 tablet 3   feeding supplement (ENSURE PLUS HIGH PROTEIN) LIQD Take 237 mLs by mouth 2 (two) times daily between meals.      folic acid  (KP FOLIC ACID ) 1 MG tablet Take 1 tablet (1 mg total) by mouth daily. 90 tablet 3   furosemide  (LASIX ) 20 MG tablet Take 20 mg by mouth daily.     magnesium  oxide (MAG-OX) 400 (240 Mg) MG tablet Take 1 tablet (400 mg total) by mouth daily. 120 tablet 3   methocarbamol  (ROBAXIN ) 500 MG tablet TAKE 1 TABLET BY MOUTH FOUR TIMES DAILY *NEW PRESCRIPTION REQUEST* (Patient taking differently: Take 500 mg by mouth at bedtime.) 360 tablet 11   ondansetron  (ZOFRAN -ODT) 4 MG disintegrating tablet Take 1 tablet (4 mg total) by mouth every 8 (eight) hours as needed for nausea or vomiting. 120 tablet 3   pantoprazole  (PROTONIX ) 40 MG tablet Take 1 tablet (40 mg total) by mouth daily. 90 tablet 3   tamsulosin  (FLOMAX ) 0.4 MG CAPS capsule Take 1 capsule (0.4 mg total) by mouth daily. 90 capsule 3   umeclidinium bromide  (INCRUSE ELLIPTA ) 62.5 MCG/ACT AEPB Inhale 1 puff into the lungs daily. (Patient taking differently: Inhale 1 puff into the lungs daily as needed.) 30 each 6   No current facility-administered medications for this visit.    Allergies as of 01/29/2024   (No Known Allergies)    Family History  Problem Relation Age of Onset   Hearing loss Mother    Miscarriages / Stillbirths Mother    Vision loss Mother    Hyperlipidemia Sister    Hypertension Sister    Stroke Sister    Heart attack Brother 24   Cancer Brother    Hearing loss Brother    Heart disease Brother    Hypertension Brother    Hearing loss Brother    Heart disease Brother    Hypertension Brother    Cancer - Colon Neg Hx     Social History   Socioeconomic History   Marital status: Divorced    Spouse name: Not on file   Number of children: 1   Years of education: 11   Highest education level: 11th grade  Occupational History    Employer: [employer]  Tobacco Use   Smoking status: Former    Current packs/day: 0.00    Average packs/day: 1 pack/day for 40.0 years (40.0 ttl pk-yrs)    Types:  Cigarettes    Start date: 07/1981    Quit date: 07/2021    Years since quitting: 2.5   Smokeless tobacco: Never   Tobacco comments:    1 pack of cigarettes smoked daily. 07/17/21 ARJ, RN   Vaping Use   Vaping status: Never Used  Substance and Sexual Activity   Alcohol use: No    Comment: Prior history of regular alcohol use  Drug use: No   Sexual activity: Not Currently  Other Topics Concern   Not on file  Social History Narrative   Not on file   Social Drivers of Health   Tobacco Use: Medium Risk (01/29/2024)   Patient History    Smoking Tobacco Use: Former    Smokeless Tobacco Use: Never    Passive Exposure: Not on file  Financial Resource Strain: Medium Risk (01/20/2024)   Overall Financial Resource Strain (CARDIA)    Difficulty of Paying Living Expenses: Somewhat hard  Food Insecurity: No Food Insecurity (01/20/2024)   Epic    Worried About Programme Researcher, Broadcasting/film/video in the Last Year: Never true    Ran Out of Food in the Last Year: Never true  Transportation Needs: No Transportation Needs (01/20/2024)   Epic    Lack of Transportation (Medical): No    Lack of Transportation (Non-Medical): No  Recent Concern: Transportation Needs - Unmet Transportation Needs (12/17/2023)   Epic    Lack of Transportation (Medical): Yes    Lack of Transportation (Non-Medical): No  Physical Activity: Inactive (12/15/2023)   Exercise Vital Sign    Days of Exercise per Week: 0 days    Minutes of Exercise per Session: Not on file  Stress: No Stress Concern Present (12/15/2023)   [employer] of Occupational Health - Occupational Stress Questionnaire    Feeling of Stress: Not at all  Social Connections: Socially Isolated (12/17/2023)   Social Connection and Isolation Panel    Frequency of Communication with Friends and Family: Never    Frequency of Social Gatherings with Friends and Family: Never    Attends Religious Services: Never    Database Administrator or Organizations: No     Attends Banker Meetings: Never    Marital Status: Divorced  Catering Manager Violence: Not At Risk (01/20/2024)   Epic    Fear of Current or Ex-Partner: No    Emotionally Abused: No    Physically Abused: No    Sexually Abused: No  Depression (PHQ2-9): Low Risk (01/02/2024)   Depression (PHQ2-9)    PHQ-2 Score: 0  Recent Concern: Depression (PHQ2-9) - Medium Risk (11/28/2023)   Depression (PHQ2-9)    PHQ-2 Score: 6  Alcohol Screen: Low Risk (08/30/2022)   Alcohol Screen    Last Alcohol Screening Score (AUDIT): 0  Housing: Low Risk (01/20/2024)   Epic    Unable to Pay for Housing in the Last Year: No    Number of Times Moved in the Last Year: 0    Homeless in the Last Year: No  Recent Concern: Housing - High Risk (12/17/2023)   Epic    Unable to Pay for Housing in the Last Year: Yes    Number of Times Moved in the Last Year: 0    Homeless in the Last Year: No  Utilities: Not At Risk (01/20/2024)   Epic    Threatened with loss of utilities: No  Health Literacy: Adequate Health Literacy (11/28/2023)   B1300 Health Literacy    Frequency of need for help with medical instructions: Never    Subjective: Review of Systems  Constitutional:  Negative for chills and fever.  HENT:  Negative for congestion and hearing loss.   Eyes:  Negative for blurred vision and double vision.  Respiratory:  Negative for cough and shortness of breath.   Cardiovascular:  Negative for chest pain and palpitations.  Gastrointestinal:  Negative for abdominal pain, blood in stool, constipation, diarrhea, heartburn, melena  and vomiting.  Genitourinary:  Negative for dysuria and urgency.  Musculoskeletal:  Negative for joint pain and myalgias.  Skin:  Negative for itching and rash.  Neurological:  Negative for dizziness and headaches.  Psychiatric/Behavioral:  Negative for depression. The patient is not nervous/anxious.        Objective: BP 107/61 (BP Location: Left Arm, Patient Position:  Sitting, Cuff Size: Normal)   Pulse 60   Temp (!) 97.3 F (36.3 C) (Temporal)   Ht 5' 10 (1.778 m)   Wt 169 lb (76.7 kg) Comment: weight per patient done on 01/28/2024 per nurse visit from hospice.  BMI 24.25 kg/m  Physical Exam Constitutional:      Appearance: Normal appearance.  HENT:     Head: Normocephalic and atraumatic.  Eyes:     Extraocular Movements: Extraocular movements intact.     Conjunctiva/sclera: Conjunctivae normal.  Cardiovascular:     Rate and Rhythm: Normal rate and regular rhythm.  Pulmonary:     Effort: Pulmonary effort is normal.     Breath sounds: Normal breath sounds.  Abdominal:     General: Bowel sounds are normal.     Palpations: Abdomen is soft.  Musculoskeletal:        General: Normal range of motion.     Cervical back: Normal range of motion and neck supple.  Skin:    General: Skin is warm.  Neurological:     General: No focal deficit present.     Mental Status: He is alert and oriented to person, place, and time.  Psychiatric:        Mood and Affect: Mood normal.        Behavior: Behavior normal.      Assessment/Plan:  1.  Chronic constipation-uncontrolled on 2 stool softeners daily.  Proceed with MiraLAX  purge today.  Samples of Linzess  145 mcg given to start tomorrow.  Call with update in 1 to 2 weeks and I can send in formal prescription if improved.  Counseled on room to increase or decrease dose depending on how he responds.  2.  Chronic GERD-continue pantoprazole  daily.  Follow-up in 6 weeks. 01/29/2024 11:15 AM   Disclaimer: This note was dictated with voice recognition software. Similar sounding words can inadvertently be transcribed and may not be corrected upon review.  "

## 2024-01-29 NOTE — Patient Instructions (Signed)
 Take 17 g (1 capful or 1 packet) of MiraLAX  mixed into water , Gatorade, or Powerade followed by 8 ounces of plain water .  Do this once an hour until you have a good bowel movement, but no more than 5 doses.  Once adequately cleaned out, I want you to start taking Linzess  145 mcg daily (samples will be ready for you at [facility] office).  Let me know in a week or 2 how you are doing, we have room to increase or decrease dose depending how you respond.  Follow-up in 6 weeks.  It was very nice seeing you both today.  Dr. Cindie

## 2024-01-29 NOTE — Progress Notes (Signed)
 Medication Samples have been provided to the patient.  Drug name: Linzess        Strength: 145 mcg        Qty: 2 boxes   LOT: [id number]  Exp.Date: 03/2024  Dosing instructions: as directed  The patient has been instructed regarding the correct time, dose, and frequency of taking this medication, including desired effects and most common side effects.   Madelin Ponto 11:39 AM 01/29/2024

## 2024-01-30 ENCOUNTER — Other Ambulatory Visit (HOSPITAL_COMMUNITY): Payer: Self-pay

## 2024-01-30 ENCOUNTER — Other Ambulatory Visit: Payer: Self-pay | Admitting: *Deleted

## 2024-01-30 ENCOUNTER — Other Ambulatory Visit: Payer: Self-pay | Admitting: Student

## 2024-01-30 ENCOUNTER — Other Ambulatory Visit: Payer: Self-pay

## 2024-01-30 ENCOUNTER — Telehealth: Payer: Self-pay | Admitting: Family Medicine

## 2024-01-30 MED ORDER — APIXABAN 5 MG PO TABS: 5.0000 mg | ORAL_TABLET | Freq: Two times a day (BID) | ORAL | 3 refills | Status: AC

## 2024-01-30 MED ORDER — ESCITALOPRAM OXALATE 10 MG PO TABS: 10.0000 mg | ORAL_TABLET | Freq: Every day | ORAL | 3 refills | Status: AC

## 2024-01-30 MED ORDER — CHLORTHALIDONE 25 MG PO TABS: 12.5000 mg | ORAL_TABLET | Freq: Every morning | ORAL | 3 refills | Status: AC

## 2024-01-30 NOTE — Patient Outreach (Signed)
 Complex Care Management   Visit Note  01/30/2024  Name:  Darryl Ward MRN: [id number] DOB: 15-Dec-1945  Situation: Referral received for Complex Care Management related to Heart Failure, COPD, and Atrial Fibrillation I obtained verbal consent from Patient.  Visit completed with Patient  on the phone          Background:   Past Medical History:  Diagnosis Date   AAA (abdominal aortic aneurysm)    Anemia    Arthritis    Blood transfusion without reported diagnosis 10/2021   Broken hip (HCC)    Cancer (HCC)    Carotid artery disease    Nonobstructive   Cataract    CHF (congestive heart failure) (HCC)    COPD (chronic obstructive pulmonary disease) (HCC)    Coronary atherosclerosis of native coronary artery    PTCA small diagonal 2007 otherwise nonobstructive CAD   Depression    Dysrhythmia    Essential hypertension, benign    GERD (gastroesophageal reflux disease)    Hyperlipidemia    NSTEMI (non-ST elevated myocardial infarction) (HCC) 2007   Stroke [facility]) 2004   Stroke [facility]) 2025   TIA (transient ischemic attack) 2006    Assessment: Patient Reported Symptoms:  Cognitive Cognitive Status: Requires Assistance Decision Making, Struggling with memory recall, Alert and oriented to person, place, and time Cognitive/Intellectual Conditions Management [RPT]: None reported or documented in medical history or problem list   Health Maintenance Behaviors: Annual physical exam Health Facilitated by: Rest  Neurological Neurological Review of Symptoms: No symptoms reported Neurological Management Strategies: Routine screening Neurological Self-Management Outcome: 5 (very good)  HEENT HEENT Symptoms Reported: No symptoms reported HEENT Management Strategies: Routine screening HEENT Self-Management Outcome: 5 (very good)    Cardiovascular Cardiovascular Symptoms Reported: No symptoms reported Does patient have uncontrolled Hypertension?: No Cardiovascular Management Strategies:  Routine screening Cardiovascular Self-Management Outcome: 5 (very good)  Respiratory Respiratory Symptoms Reported: No symptoms reported Respiratory Management Strategies: Routine screening Respiratory Self-Management Outcome: 4 (good)  Endocrine Endocrine Symptoms Reported: No symptoms reported Is patient diabetic?: No Endocrine Self-Management Outcome: 5 (very good)  Gastrointestinal Gastrointestinal Symptoms Reported: Constipation Gastrointestinal Management Strategies: Medication therapy Gastrointestinal Self-Management Outcome: 3 (uncertain)    Genitourinary Genitourinary Symptoms Reported: Incontinence Genitourinary Management Strategies: Coping strategies, Incontinence garment/pad Genitourinary Self-Management Outcome: 4 (good)  Integumentary Integumentary Symptoms Reported: No symptoms reported Skin Self-Management Outcome: 4 (good)  Musculoskeletal Musculoskelatal Symptoms Reviewed: Difficulty walking, Limited mobility, Weakness, Unsteady gait Musculoskeletal Management Strategies: Coping strategies Musculoskeletal Self-Management Outcome: 4 (good) Falls in the past year?: Yes Number of falls in past year: 2 or more Was there an injury with Fall?: Yes Fall Risk Category Calculator: 3 Patient Fall Risk Level: High Fall Risk Patient at Risk for Falls Due to: History of fall(s), Impaired balance/gait, Impaired mobility Fall risk Follow up: Falls evaluation completed, Education provided  Psychosocial Psychosocial Symptoms Reported: No symptoms reported Behavioral Management Strategies: Coping strategies Behavioral Health Self-Management Outcome: 5 (very good) Major Change/Loss/Stressor/Fears (CP): Denies Quality of Family Relationships: non-existent Do you feel physically threatened by others?: No    01/30/2024    PHQ2-9 Depression Screening   Little interest or pleasure in doing things Not at all  Feeling down, depressed, or hopeless Not at all  PHQ-2 - Total Score 0   Trouble falling or staying asleep, or sleeping too much    Feeling tired or having little energy    Poor appetite or overeating     Feeling bad about yourself - or that you are a  failure or have let yourself or your family down    Trouble concentrating on things, such as reading the newspaper or watching television    Moving or speaking so slowly that other people could have noticed.  Or the opposite - being so fidgety or restless that you have been moving around a lot more than usual    Thoughts that you would be better off dead, or hurting yourself in some way    PHQ2-9 Total Score    If you checked off any problems, how difficult have these problems made it for you to do your work, take care of things at home, or get along with other people    Depression Interventions/Treatment      There were no vitals filed for this visit. Pain Scale: 0-10 Pain Score: 0-No pain  Medications Reviewed Today     Reviewed by Bertrum Rosina HERO, RN (Registered Nurse) on 01/30/24 at 1419  Med List Status: <None>   Medication Order Taking? Sig Documenting Provider Last Dose Status Informant  acetaminophen  (TYLENOL ) 325 MG tablet [id number] Yes Take 2 tablets (650 mg total) by mouth every 6 (six) hours as needed for mild pain (pain score 1-3) or fever (or Fever >/= 101). [name] Toribio GAILS, MD  Active Spouse/Significant Other, Pharmacy Records  albuterol  (VENTOLIN  HFA) [phone] Base) MCG/ACT inhaler [id number] Yes Inhale 2 puffs into the lungs every 6 (six) hours as needed for wheezing or shortness of breath. Byrum, Robert S, MD  Active   amiodarone  (PACERONE ) 200 MG tablet [id number] Yes Take 1 tablet (200 mg total) by mouth daily. Johnson, Brittany M, PA-C  Active   apixaban  (ELIQUIS ) 5 MG TABS tablet [id number] Yes Take 1 tablet (5 mg total) by mouth 2 (two) times daily.  Patient taking differently: Take 5 mg by mouth daily.   Cook, Jayce G, DO  Active   chlorthalidone  (HYGROTON ) 25 MG tablet [id number] Yes  Take 0.5 tablets (12.5 mg total) by mouth every morning. Strader, Brittany M, PA-C  Active   escitalopram  (LEXAPRO ) 10 MG tablet [id number] Yes Take 1 tablet (10 mg total) by mouth daily. Cook, Jayce G, DO  Active   feeding supplement (ENSURE PLUS HIGH PROTEIN) LIQD [id number] Yes Take 237 mLs by mouth 2 (two) times daily between meals. [name] Toribio GAILS, MD  Active Spouse/Significant Other, Pharmacy Records  folic acid  (KP FOLIC ACID ) 1 MG tablet [id number] Yes Take 1 tablet (1 mg total) by mouth daily. Cook, Jayce G, DO  Active Spouse/Significant Other, Pharmacy Records  furosemide  (LASIX ) 20 MG tablet [id number] Yes Take 20 mg by mouth daily. [provider]  Active Spouse/Significant Other, Pharmacy Records           Med Note PATTRICIA, CRYSTAL L   Thu Jan 29, 2024 11:13 AM)    linaclotide  (LINZESS ) 145 MCG CAPS capsule [id number] Yes Take 1 capsule (145 mcg total) by mouth daily before breakfast. Cindie Carlin POUR, DO  Active   magnesium  oxide (MAG-OX) 400 (240 Mg) MG tablet [id number] Yes Take 1 tablet (400 mg total) by mouth daily. Cook, Jayce G, DO  Active Spouse/Significant Other, Pharmacy Records  methocarbamol  (ROBAXIN ) 500 MG tablet [id number] Yes TAKE 1 TABLET BY MOUTH FOUR TIMES DAILY *NEW PRESCRIPTION REQUEST*  Patient taking differently: Take 500 mg by mouth at bedtime.   Cook, Jayce G, DO  Active Spouse/Significant Other, Pharmacy Records  ondansetron  (ZOFRAN -ODT) 4 MG disintegrating tablet [id number] Yes Take 1 tablet (4 mg total) by mouth every 8 (eight)  hours as needed for nausea or vomiting. Geofm Delon BRAVO, NP  Active   pantoprazole  (PROTONIX ) 40 MG tablet [id number] Yes Take 1 tablet (40 mg total) by mouth daily. Pearlean Manus, MD  Active   tamsulosin  (FLOMAX ) 0.4 MG CAPS capsule [id number] Yes Take 1 capsule (0.4 mg total) by mouth daily. Cook, Jayce G, DO  Active   umeclidinium bromide  (INCRUSE ELLIPTA ) 62.5 MCG/ACT AEPB [id number] Yes Inhale 1 puff into the lungs daily.  Pearlean Manus, MD  Active   Med List Note Lorne Been, CPhT 12/08/23 [id number]): Wife handles medications.             Recommendation:   Continue Current Plan of Care  Follow Up Plan:   Closing From:  Complex Care Management: Patient actively engaged with hospice at this time  Rosina Forte, BSN RN [facility], [facility] RN Care Manager Direct Dial: [phone]  Fax: [phone]

## 2024-01-30 NOTE — Telephone Encounter (Signed)
 Pt of Dr. Debera. This RX was prescribed by another Provider. Does Dr. Debera want to refill? Please advise.

## 2024-01-30 NOTE — Telephone Encounter (Signed)
 Refill   escitalopram  (LEXAPRO ) 10 MG tablet   apixaban  (ELIQUIS ) 5 MG TABS tablet  prescribe by different provider last filled 12/17/23  Atorvastatin  -not on current medication list  [facility]

## 2024-01-30 NOTE — Patient Instructions (Signed)
 Visit Information  Thank you for taking time to visit with me today. Please don't hesitate to contact me if I can be of assistance to you before our next scheduled appointment.  Our next appointment is no further scheduled appointments.  Patient is active with hospice. Please call the care guide team at [phone] if you need to cancel or reschedule your appointment.   Please call the Suicide and Crisis Lifeline: 988 call the [place]  National Suicide Prevention Lifeline: (417)765-9944 or TTY: 907-593-0351 TTY (561) 610-7928) to talk to a trained counselor call 1-800-273-TALK (toll free, 24 hour hotline) if you are experiencing a Mental Health or Behavioral Health Crisis or need someone to talk to.  Patient verbalized understanding of Care plan and visit instructions communicated this visit  Rosina Forte, BSN RN [facility], [facility] RN Care Manager Direct Dial: [phone]  Fax: [phone]

## 2024-02-03 ENCOUNTER — Other Ambulatory Visit: Payer: Self-pay

## 2024-02-03 ENCOUNTER — Telehealth: Payer: Self-pay | Admitting: Licensed Clinical Social Worker

## 2024-02-03 MED ORDER — AMIODARONE HCL 200 MG PO TABS: 200.0000 mg | ORAL_TABLET | Freq: Every day | ORAL | 2 refills | Status: AC

## 2024-02-04 ENCOUNTER — Telehealth: Payer: Self-pay | Admitting: Family Medicine

## 2024-02-04 ENCOUNTER — Other Ambulatory Visit: Payer: Self-pay

## 2024-02-04 NOTE — Telephone Encounter (Signed)
 Prescription for atorvastatin  sent to  [facility] not on current medication list

## 2024-02-04 NOTE — Telephone Encounter (Signed)
 Patient was wanting refill on this but its not on medication list?

## 2024-02-08 ENCOUNTER — Other Ambulatory Visit: Payer: Self-pay | Admitting: Family Medicine

## 2024-02-08 MED ORDER — PANTOPRAZOLE SODIUM 40 MG PO TBEC: 40.0000 mg | DELAYED_RELEASE_TABLET | Freq: Every day | ORAL | 3 refills | Status: AC

## 2024-02-11 ENCOUNTER — Telehealth: Payer: Self-pay | Admitting: Family Medicine

## 2024-02-11 NOTE — Telephone Encounter (Signed)
 Patient is requesting a prescription for atorvastatin   [facility]

## 2024-02-12 ENCOUNTER — Other Ambulatory Visit: Payer: Self-pay | Admitting: Family Medicine

## 2024-02-12 MED ORDER — ATORVASTATIN CALCIUM 80 MG PO TABS: 80.0000 mg | ORAL_TABLET | Freq: Every day | ORAL | 3 refills | Status: AC

## 2024-03-22 ENCOUNTER — Ambulatory Visit (HOSPITAL_BASED_OUTPATIENT_CLINIC_OR_DEPARTMENT_OTHER): Admitting: Pulmonary Disease

## 2024-03-26 ENCOUNTER — Inpatient Hospital Stay: Attending: Hematology

## 2024-03-26 ENCOUNTER — Other Ambulatory Visit (HOSPITAL_COMMUNITY)

## 2024-03-30 ENCOUNTER — Other Ambulatory Visit (HOSPITAL_COMMUNITY)

## 2024-04-02 ENCOUNTER — Inpatient Hospital Stay: Admitting: Oncology

## 2024-12-03 ENCOUNTER — Ambulatory Visit
